# Patient Record
Sex: Male | Born: 1960
Health system: Southern US, Community
[De-identification: ages and names within clinical notes are randomized; demographics above are authoritative.]

## PROBLEM LIST (undated history)

## (undated) ENCOUNTER — Encounter

## (undated) ENCOUNTER — Ambulatory Visit

## (undated) ENCOUNTER — Encounter
Attending: Student in an Organized Health Care Education/Training Program | Primary: Student in an Organized Health Care Education/Training Program

## (undated) ENCOUNTER — Ambulatory Visit: Payer: MEDICARE

## (undated) ENCOUNTER — Encounter: Attending: Certified Registered" | Primary: Certified Registered"

## (undated) ENCOUNTER — Telehealth: Attending: Certified Registered" | Primary: Certified Registered"

## (undated) ENCOUNTER — Telehealth

## (undated) ENCOUNTER — Encounter: Attending: Gastroenterology | Primary: Gastroenterology

## (undated) ENCOUNTER — Encounter: Payer: MEDICARE | Attending: Anesthesiology | Primary: Anesthesiology

## (undated) ENCOUNTER — Encounter: Payer: MEDICARE | Attending: Dermatology | Primary: Dermatology

## (undated) ENCOUNTER — Telehealth: Attending: Internal Medicine | Primary: Internal Medicine

## (undated) ENCOUNTER — Telehealth: Attending: Ambulatory Care | Primary: Ambulatory Care

## (undated) ENCOUNTER — Encounter
Payer: MEDICARE | Attending: Student in an Organized Health Care Education/Training Program | Primary: Student in an Organized Health Care Education/Training Program

## (undated) ENCOUNTER — Ambulatory Visit: Payer: Medicare (Managed Care)

## (undated) ENCOUNTER — Ambulatory Visit: Attending: Nurse Practitioner | Primary: Nurse Practitioner

## (undated) ENCOUNTER — Encounter: Attending: Surgery | Primary: Surgery

## (undated) ENCOUNTER — Non-Acute Institutional Stay: Payer: MEDICARE

## (undated) ENCOUNTER — Encounter: Attending: Ambulatory Care | Primary: Ambulatory Care

## (undated) ENCOUNTER — Ambulatory Visit: Payer: MEDICARE | Attending: Dermatology | Primary: Dermatology

## (undated) ENCOUNTER — Non-Acute Institutional Stay: Payer: MEDICARE | Attending: Nutritionist | Primary: Nutritionist

## (undated) ENCOUNTER — Encounter: Attending: Physician Assistant | Primary: Physician Assistant

## (undated) ENCOUNTER — Ambulatory Visit: Payer: Medicare (Managed Care) | Attending: Hematology | Primary: Hematology

## (undated) ENCOUNTER — Inpatient Hospital Stay

## (undated) ENCOUNTER — Ambulatory Visit
Payer: MEDICARE | Attending: Student in an Organized Health Care Education/Training Program | Primary: Student in an Organized Health Care Education/Training Program

## (undated) ENCOUNTER — Ambulatory Visit
Attending: Student in an Organized Health Care Education/Training Program | Primary: Student in an Organized Health Care Education/Training Program

## (undated) ENCOUNTER — Telehealth: Attending: Gastroenterology | Primary: Gastroenterology

## (undated) ENCOUNTER — Telehealth: Attending: Nutritionist | Primary: Nutritionist

## (undated) ENCOUNTER — Ambulatory Visit: Attending: Surgery | Primary: Surgery

## (undated) ENCOUNTER — Inpatient Hospital Stay: Payer: Medicare (Managed Care)

## (undated) ENCOUNTER — Ambulatory Visit: Payer: MEDICARE | Attending: Ambulatory Care | Primary: Ambulatory Care

## (undated) ENCOUNTER — Ambulatory Visit: Attending: Family Medicine | Primary: Family Medicine

## (undated) ENCOUNTER — Encounter: Attending: Anesthesiology | Primary: Anesthesiology

## (undated) ENCOUNTER — Encounter: Payer: MEDICARE | Attending: Health Service | Primary: Health Service

## (undated) DIAGNOSIS — I10 Essential (primary) hypertension: Secondary | ICD-10-CM

## (undated) DIAGNOSIS — G43109 Migraine with aura, not intractable, without status migrainosus: Secondary | ICD-10-CM

## (undated) DIAGNOSIS — Q2381 Bicuspid aortic valve: Secondary | ICD-10-CM

## (undated) DIAGNOSIS — G4730 Sleep apnea, unspecified: Secondary | ICD-10-CM

## (undated) DIAGNOSIS — K219 Gastro-esophageal reflux disease without esophagitis: Secondary | ICD-10-CM

## (undated) DIAGNOSIS — E079 Disorder of thyroid, unspecified: Secondary | ICD-10-CM

## (undated) DIAGNOSIS — R7309 Other abnormal glucose: Secondary | ICD-10-CM

## (undated) DIAGNOSIS — L299 Pruritus, unspecified: Secondary | ICD-10-CM

## (undated) DIAGNOSIS — E119 Type 2 diabetes mellitus without complications: Secondary | ICD-10-CM

## (undated) DIAGNOSIS — R011 Cardiac murmur, unspecified: Secondary | ICD-10-CM

## (undated) DIAGNOSIS — G4733 Obstructive sleep apnea (adult) (pediatric): Secondary | ICD-10-CM

## (undated) DIAGNOSIS — K760 Fatty (change of) liver, not elsewhere classified: Secondary | ICD-10-CM

## (undated) DIAGNOSIS — I2699 Other pulmonary embolism without acute cor pulmonale: Secondary | ICD-10-CM

## (undated) DIAGNOSIS — R17 Unspecified jaundice: Secondary | ICD-10-CM

## (undated) DIAGNOSIS — Z944 Liver transplant status: Secondary | ICD-10-CM

## (undated) DIAGNOSIS — T7840XA Allergy, unspecified, initial encounter: Secondary | ICD-10-CM

## (undated) DIAGNOSIS — Q231 Congenital insufficiency of aortic valve: Secondary | ICD-10-CM

## (undated) DIAGNOSIS — K7460 Unspecified cirrhosis of liver: Secondary | ICD-10-CM

## (undated) DIAGNOSIS — I2510 Atherosclerotic heart disease of native coronary artery without angina pectoris: Secondary | ICD-10-CM

## (undated) DIAGNOSIS — J45909 Unspecified asthma, uncomplicated: Secondary | ICD-10-CM

## (undated) HISTORY — DX: Essential (primary) hypertension: I10

## (undated) HISTORY — DX: Fatty (change of) liver, not elsewhere classified: K76.0

## (undated) HISTORY — DX: Allergy, unspecified, initial encounter: T78.40XA

## (undated) HISTORY — DX: Liver transplant status: Z94.4

## (undated) HISTORY — DX: Cardiac murmur, unspecified: R01.1

## (undated) HISTORY — DX: Obstructive sleep apnea (adult) (pediatric): G47.33

## (undated) HISTORY — PX: CHOLECYSTECTOMY: SHX55

## (undated) HISTORY — DX: Bicuspid aortic valve: Q23.81

## (undated) HISTORY — DX: Type 2 diabetes mellitus without complications: E11.9

## (undated) HISTORY — DX: Congenital insufficiency of aortic valve: Q23.1

## (undated) HISTORY — DX: Migraine with aura, not intractable, without status migrainosus: G43.109

## (undated) HISTORY — DX: Other pulmonary embolism without acute cor pulmonale: I26.99

## (undated) HISTORY — DX: Sleep apnea, unspecified: G47.30

## (undated) HISTORY — PX: CARDIAC VALVE REPLACEMENT: SHX585

## (undated) HISTORY — DX: Unspecified cirrhosis of liver: K74.60

## (undated) HISTORY — PX: LIVER TRANSPLANT: SHX410

## (undated) HISTORY — DX: Disorder of thyroid, unspecified: E07.9

## (undated) HISTORY — DX: Other abnormal glucose: R73.09

## (undated) MED ORDER — FUROSEMIDE 20 MG TABLET
ORAL | 0 days
Start: ? — End: 2019-06-20

---

## 2000-11-15 ENCOUNTER — Encounter: Payer: Self-pay | Admitting: Family Medicine

## 2000-11-15 ENCOUNTER — Encounter: Admission: RE | Admit: 2000-11-15 | Discharge: 2000-11-15 | Payer: Self-pay | Admitting: Family Medicine

## 2002-07-25 HISTORY — PX: DOPPLER ECHOCARDIOGRAPHY: SHX263

## 2004-07-16 ENCOUNTER — Ambulatory Visit: Payer: Self-pay | Admitting: Internal Medicine

## 2005-02-18 ENCOUNTER — Ambulatory Visit: Payer: Self-pay | Admitting: Family Medicine

## 2005-03-23 ENCOUNTER — Ambulatory Visit: Payer: Self-pay | Admitting: Family Medicine

## 2005-04-23 HISTORY — PX: CARDIOVASCULAR STRESS TEST: SHX262

## 2006-01-15 ENCOUNTER — Ambulatory Visit: Payer: Self-pay | Admitting: Family Medicine

## 2006-01-21 ENCOUNTER — Encounter: Admission: RE | Admit: 2006-01-21 | Discharge: 2006-01-21 | Payer: Self-pay | Admitting: Family Medicine

## 2006-01-21 ENCOUNTER — Ambulatory Visit: Payer: Self-pay | Admitting: Family Medicine

## 2006-07-16 ENCOUNTER — Telehealth (INDEPENDENT_AMBULATORY_CARE_PROVIDER_SITE_OTHER): Payer: Self-pay | Admitting: *Deleted

## 2006-08-09 DIAGNOSIS — E039 Hypothyroidism, unspecified: Secondary | ICD-10-CM | POA: Insufficient documentation

## 2006-08-09 DIAGNOSIS — I10 Essential (primary) hypertension: Secondary | ICD-10-CM | POA: Insufficient documentation

## 2006-08-09 DIAGNOSIS — J309 Allergic rhinitis, unspecified: Secondary | ICD-10-CM | POA: Insufficient documentation

## 2006-08-11 ENCOUNTER — Ambulatory Visit: Payer: Self-pay | Admitting: Family Medicine

## 2006-08-11 DIAGNOSIS — H9190 Unspecified hearing loss, unspecified ear: Secondary | ICD-10-CM | POA: Insufficient documentation

## 2006-08-12 LAB — CONVERTED CEMR LAB
ALT: 91 units/L — ABNORMAL HIGH (ref 0–40)
AST: 56 units/L — ABNORMAL HIGH (ref 0–37)
BUN: 11 mg/dL (ref 6–23)
Basophils Absolute: 0.1 10*3/uL (ref 0.0–0.1)
Basophils Relative: 0.8 % (ref 0.0–1.0)
CO2: 30 meq/L (ref 19–32)
Calcium: 9.5 mg/dL (ref 8.4–10.5)
Chloride: 108 meq/L (ref 96–112)
Cholesterol: 170 mg/dL (ref 0–200)
Creatinine, Ser: 1.1 mg/dL (ref 0.4–1.5)
Eosinophils Absolute: 0.4 10*3/uL (ref 0.0–0.6)
Eosinophils Relative: 5 % (ref 0.0–5.0)
GFR calc Af Amer: 94 mL/min
GFR calc non Af Amer: 77 mL/min
Glucose, Bld: 97 mg/dL (ref 70–99)
HCT: 47.5 % (ref 39.0–52.0)
HDL: 35.3 mg/dL — ABNORMAL LOW (ref >39.0)
Hemoglobin: 16.3 g/dL (ref 13.0–17.0)
LDL Cholesterol: 117 mg/dL — ABNORMAL HIGH (ref 0–99)
Lymphocytes Relative: 27.4 % (ref 12.0–46.0)
MCHC: 34.2 g/dL (ref 30.0–36.0)
MCV: 87.1 fL (ref 78.0–100.0)
Monocytes Absolute: 0.8 10*3/uL — ABNORMAL HIGH (ref 0.2–0.7)
Monocytes Relative: 9 % (ref 3.0–11.0)
Neutro Abs: 4.8 10*3/uL (ref 1.4–7.7)
Neutrophils Relative %: 57.8 % (ref 43.0–77.0)
Platelets: 286 10*3/uL (ref 150–400)
Potassium: 4.8 meq/L (ref 3.5–5.1)
RBC: 5.45 M/uL (ref 4.22–5.81)
RDW: 11.7 % (ref 11.5–14.6)
Sodium: 139 meq/L (ref 135–145)
Total CHOL/HDL Ratio: 4.8
Triglycerides: 90 mg/dL (ref 0–149)
VLDL: 18 mg/dL (ref 0–40)
WBC: 8.4 10*3/uL (ref 4.5–10.5)

## 2006-08-17 ENCOUNTER — Encounter (INDEPENDENT_AMBULATORY_CARE_PROVIDER_SITE_OTHER): Payer: Self-pay | Admitting: Internal Medicine

## 2006-08-19 ENCOUNTER — Telehealth (INDEPENDENT_AMBULATORY_CARE_PROVIDER_SITE_OTHER): Payer: Self-pay | Admitting: Internal Medicine

## 2006-08-19 ENCOUNTER — Encounter (INDEPENDENT_AMBULATORY_CARE_PROVIDER_SITE_OTHER): Payer: Self-pay | Admitting: Internal Medicine

## 2006-08-31 ENCOUNTER — Encounter: Admission: RE | Admit: 2006-08-31 | Discharge: 2006-08-31 | Payer: Self-pay | Admitting: Endocrinology

## 2007-02-14 ENCOUNTER — Ambulatory Visit: Payer: Self-pay | Admitting: Internal Medicine

## 2007-02-14 DIAGNOSIS — E7800 Pure hypercholesterolemia, unspecified: Secondary | ICD-10-CM | POA: Insufficient documentation

## 2007-02-15 LAB — CONVERTED CEMR LAB
ALT: 136 units/L — ABNORMAL HIGH (ref 0–53)
AST: 95 units/L — ABNORMAL HIGH (ref 0–37)
BUN: 12 mg/dL (ref 6–23)
CO2: 27 meq/L (ref 19–32)
Calcium: 9.6 mg/dL (ref 8.4–10.5)
Chloride: 103 meq/L (ref 96–112)
Cholesterol: 149 mg/dL (ref 0–200)
Creatinine, Ser: 1 mg/dL (ref 0.4–1.5)
GFR calc Af Amer: 104 mL/min
GFR calc non Af Amer: 86 mL/min
Glucose, Bld: 105 mg/dL — ABNORMAL HIGH (ref 70–99)
HDL: 35.7 mg/dL — ABNORMAL LOW (ref >39.0)
LDL Cholesterol: 94 mg/dL (ref 0–99)
Potassium: 4.2 meq/L (ref 3.5–5.1)
Sodium: 139 meq/L (ref 135–145)
TSH: 2.65 microintl units/mL (ref 0.35–5.50)
Total CHOL/HDL Ratio: 4.2
Triglycerides: 96 mg/dL (ref 0–149)
VLDL: 19 mg/dL (ref 0–40)

## 2007-08-19 HISTORY — PX: BIOPSY THYROID: PRO38

## 2007-08-29 ENCOUNTER — Ambulatory Visit: Payer: Self-pay | Admitting: Family Medicine

## 2007-08-29 ENCOUNTER — Encounter (INDEPENDENT_AMBULATORY_CARE_PROVIDER_SITE_OTHER): Payer: Self-pay | Admitting: Internal Medicine

## 2007-08-29 DIAGNOSIS — R739 Hyperglycemia, unspecified: Secondary | ICD-10-CM | POA: Insufficient documentation

## 2007-08-29 DIAGNOSIS — R0602 Shortness of breath: Secondary | ICD-10-CM | POA: Insufficient documentation

## 2007-08-30 ENCOUNTER — Encounter (INDEPENDENT_AMBULATORY_CARE_PROVIDER_SITE_OTHER): Payer: Self-pay | Admitting: Internal Medicine

## 2007-09-01 LAB — CONVERTED CEMR LAB
ALT: 114 units/L — ABNORMAL HIGH (ref 0–53)
AST: 82 units/L — ABNORMAL HIGH (ref 0–37)
Albumin: 4.3 g/dL (ref 3.5–5.2)
Alkaline Phosphatase: 94 units/L (ref 39–117)
BUN: 12 mg/dL (ref 6–23)
Bilirubin, Direct: 0.2 mg/dL (ref 0.0–0.3)
CO2: 28 meq/L (ref 19–32)
Calcium: 10.1 mg/dL (ref 8.4–10.5)
Chloride: 101 meq/L (ref 96–112)
Cholesterol: 182 mg/dL (ref 0–200)
Creatinine, Ser: 1.1 mg/dL (ref 0.4–1.5)
GFR calc Af Amer: 93 mL/min
GFR calc non Af Amer: 77 mL/min
Glucose, Bld: 98 mg/dL (ref 70–99)
HDL: 37.5 mg/dL — ABNORMAL LOW (ref >39.0)
LDL Cholesterol: 125 mg/dL — ABNORMAL HIGH (ref 0–99)
Potassium: 4.2 meq/L (ref 3.5–5.1)
Sodium: 137 meq/L (ref 135–145)
TSH: 7.21 microintl units/mL — ABNORMAL HIGH (ref 0.35–5.50)
Total Bilirubin: 0.8 mg/dL (ref 0.3–1.2)
Total CHOL/HDL Ratio: 4.9
Total Protein: 7.4 g/dL (ref 6.0–8.3)
Triglycerides: 99 mg/dL (ref 0–149)
VLDL: 20 mg/dL (ref 0–40)

## 2007-09-29 ENCOUNTER — Encounter (INDEPENDENT_AMBULATORY_CARE_PROVIDER_SITE_OTHER): Payer: Self-pay | Admitting: Internal Medicine

## 2007-10-03 ENCOUNTER — Ambulatory Visit: Payer: Self-pay | Admitting: Family Medicine

## 2007-10-03 ENCOUNTER — Encounter (INDEPENDENT_AMBULATORY_CARE_PROVIDER_SITE_OTHER): Payer: Self-pay | Admitting: Internal Medicine

## 2007-11-24 HISTORY — PX: APPENDECTOMY: SHX54

## 2007-12-03 ENCOUNTER — Inpatient Hospital Stay (HOSPITAL_COMMUNITY): Admission: EM | Admit: 2007-12-03 | Discharge: 2007-12-04 | Payer: Self-pay | Admitting: Emergency Medicine

## 2007-12-03 ENCOUNTER — Ambulatory Visit: Payer: Self-pay | Admitting: Family Medicine

## 2007-12-03 ENCOUNTER — Encounter (INDEPENDENT_AMBULATORY_CARE_PROVIDER_SITE_OTHER): Payer: Self-pay | Admitting: General Surgery

## 2007-12-03 DIAGNOSIS — K3580 Unspecified acute appendicitis: Secondary | ICD-10-CM | POA: Insufficient documentation

## 2007-12-03 DIAGNOSIS — R1031 Right lower quadrant pain: Secondary | ICD-10-CM | POA: Insufficient documentation

## 2007-12-03 LAB — CONVERTED CEMR LAB
Bilirubin Urine: NEGATIVE
Blood in Urine, dipstick: NEGATIVE
Glucose, Urine, Semiquant: NEGATIVE
Ketones, urine, test strip: NEGATIVE
Nitrite: NEGATIVE
Protein, U semiquant: NEGATIVE
Specific Gravity, Urine: 1.015
Urobilinogen, UA: 0.2
WBC Urine, dipstick: NEGATIVE
pH: 6

## 2007-12-27 ENCOUNTER — Ambulatory Visit: Payer: Self-pay | Admitting: Family Medicine

## 2008-02-08 ENCOUNTER — Ambulatory Visit: Payer: Self-pay | Admitting: Family Medicine

## 2008-02-08 ENCOUNTER — Encounter (INDEPENDENT_AMBULATORY_CARE_PROVIDER_SITE_OTHER): Payer: Self-pay | Admitting: Internal Medicine

## 2008-02-09 LAB — CONVERTED CEMR LAB: TSH: 0.82 microintl units/mL (ref 0.35–5.50)

## 2008-05-30 ENCOUNTER — Ambulatory Visit: Payer: Self-pay | Admitting: Family Medicine

## 2008-05-30 DIAGNOSIS — R351 Nocturia: Secondary | ICD-10-CM | POA: Insufficient documentation

## 2008-10-09 ENCOUNTER — Ambulatory Visit: Payer: Self-pay | Admitting: Family Medicine

## 2008-10-11 LAB — CONVERTED CEMR LAB
ALT: 109 units/L — ABNORMAL HIGH (ref 0–53)
AST: 74 units/L — ABNORMAL HIGH (ref 0–37)
Albumin: 4.3 g/dL (ref 3.5–5.2)
Alkaline Phosphatase: 98 units/L (ref 39–117)
BUN: 13 mg/dL (ref 6–23)
Bilirubin, Direct: 0.1 mg/dL (ref 0.0–0.3)
CO2: 30 meq/L (ref 19–32)
Calcium: 9.6 mg/dL (ref 8.4–10.5)
Chloride: 106 meq/L (ref 96–112)
Cholesterol: 169 mg/dL (ref 0–200)
Creatinine, Ser: 1.1 mg/dL (ref 0.4–1.5)
GFR calc non Af Amer: 76.48 mL/min (ref >60)
Glucose, Bld: 95 mg/dL (ref 70–99)
HDL: 37.4 mg/dL — ABNORMAL LOW (ref >39.00)
LDL Cholesterol: 113 mg/dL — ABNORMAL HIGH (ref 0–99)
PSA: 0.52 ng/mL (ref 0.10–4.00)
Potassium: 4.5 meq/L (ref 3.5–5.1)
Sodium: 141 meq/L (ref 135–145)
TSH: 1.24 microintl units/mL (ref 0.35–5.50)
Total Bilirubin: 1 mg/dL (ref 0.3–1.2)
Total CHOL/HDL Ratio: 5
Total Protein: 7.7 g/dL (ref 6.0–8.3)
Triglycerides: 95 mg/dL (ref 0.0–149.0)
VLDL: 19 mg/dL (ref 0.0–40.0)

## 2009-04-10 ENCOUNTER — Ambulatory Visit: Payer: Self-pay | Admitting: Family Medicine

## 2009-04-10 LAB — CONVERTED CEMR LAB
ALT: 100 units/L — ABNORMAL HIGH (ref 0–53)
AST: 67 units/L — ABNORMAL HIGH (ref 0–37)
Albumin: 4.4 g/dL (ref 3.5–5.2)
Alkaline Phosphatase: 102 units/L (ref 39–117)
BUN: 15 mg/dL (ref 6–23)
Bilirubin, Direct: 0.1 mg/dL (ref 0.0–0.3)
CO2: 30 meq/L (ref 19–32)
Calcium: 9.5 mg/dL (ref 8.4–10.5)
Chloride: 107 meq/L (ref 96–112)
Cholesterol: 173 mg/dL (ref 0–200)
Creatinine, Ser: 1 mg/dL (ref 0.4–1.5)
GFR calc non Af Amer: 85.18 mL/min (ref >60)
Glucose, Bld: 107 mg/dL — ABNORMAL HIGH (ref 70–99)
HDL: 44.8 mg/dL (ref >39.00)
LDL Cholesterol: 102 mg/dL — ABNORMAL HIGH (ref 0–99)
Potassium: 4.6 meq/L (ref 3.5–5.1)
Sodium: 139 meq/L (ref 135–145)
TSH: 0.3 microintl units/mL — ABNORMAL LOW (ref 0.35–5.50)
Total Bilirubin: 0.7 mg/dL (ref 0.3–1.2)
Total CHOL/HDL Ratio: 4
Total Protein: 7.7 g/dL (ref 6.0–8.3)
Triglycerides: 129 mg/dL (ref 0.0–149.0)
VLDL: 25.8 mg/dL (ref 0.0–40.0)

## 2009-05-06 ENCOUNTER — Encounter: Payer: Self-pay | Admitting: Family Medicine

## 2009-09-20 ENCOUNTER — Emergency Department (HOSPITAL_COMMUNITY): Admission: EM | Admit: 2009-09-20 | Discharge: 2009-09-20 | Payer: Self-pay | Admitting: Family Medicine

## 2009-09-25 ENCOUNTER — Ambulatory Visit: Payer: Self-pay | Admitting: Family Medicine

## 2009-09-25 DIAGNOSIS — R21 Rash and other nonspecific skin eruption: Secondary | ICD-10-CM | POA: Insufficient documentation

## 2009-10-29 ENCOUNTER — Ambulatory Visit: Payer: Self-pay | Admitting: Family Medicine

## 2009-10-30 LAB — CONVERTED CEMR LAB
ALT: 102 units/L — ABNORMAL HIGH (ref 0–53)
AST: 67 units/L — ABNORMAL HIGH (ref 0–37)
Albumin: 4.3 g/dL (ref 3.5–5.2)
Alkaline Phosphatase: 91 units/L (ref 39–117)
BUN: 17 mg/dL (ref 6–23)
Bilirubin, Direct: 0.2 mg/dL (ref 0.0–0.3)
CO2: 27 meq/L (ref 19–32)
Calcium: 9.3 mg/dL (ref 8.4–10.5)
Chloride: 104 meq/L (ref 96–112)
Cholesterol: 183 mg/dL (ref 0–200)
Creatinine, Ser: 1 mg/dL (ref 0.4–1.5)
GFR calc non Af Amer: 82.13 mL/min (ref >60)
Glucose, Bld: 95 mg/dL (ref 70–99)
HDL: 39.1 mg/dL (ref >39.00)
LDL Cholesterol: 121 mg/dL — ABNORMAL HIGH (ref 0–99)
Potassium: 4.5 meq/L (ref 3.5–5.1)
Sodium: 138 meq/L (ref 135–145)
TSH: 1.14 microintl units/mL (ref 0.35–5.50)
Total Bilirubin: 0.8 mg/dL (ref 0.3–1.2)
Total CHOL/HDL Ratio: 5
Total Protein: 7.3 g/dL (ref 6.0–8.3)
Triglycerides: 116 mg/dL (ref 0.0–149.0)
VLDL: 23.2 mg/dL (ref 0.0–40.0)

## 2009-11-21 ENCOUNTER — Ambulatory Visit: Payer: Self-pay | Admitting: Internal Medicine

## 2009-11-27 ENCOUNTER — Encounter: Payer: Self-pay | Admitting: Internal Medicine

## 2009-11-27 ENCOUNTER — Ambulatory Visit: Payer: Self-pay

## 2009-11-27 ENCOUNTER — Ambulatory Visit: Payer: Self-pay | Admitting: Cardiovascular Disease

## 2009-11-27 ENCOUNTER — Ambulatory Visit (HOSPITAL_COMMUNITY): Admission: RE | Admit: 2009-11-27 | Discharge: 2009-11-27 | Payer: Self-pay | Admitting: Internal Medicine

## 2009-12-09 ENCOUNTER — Telehealth: Payer: Self-pay | Admitting: Internal Medicine

## 2010-02-20 ENCOUNTER — Ambulatory Visit
Admission: RE | Admit: 2010-02-20 | Discharge: 2010-02-20 | Payer: Self-pay | Source: Home / Self Care | Attending: Family Medicine | Admitting: Family Medicine

## 2010-03-27 NOTE — Miscellaneous (Signed)
Summary: new synthroid dose  Clinical Lists Changes  Medications: Added new medication of SYNTHROID 112 MCG  TABS (LEVOTHYROXINE SODIUM) 1 daily by mouth - Signed Rx of SYNTHROID 112 MCG  TABS (LEVOTHYROXINE SODIUM) 1 daily by mouth;  #30 x 1;  Signed;  Entered by: Gildardo Griffes FNP;  Authorized by: Gildardo Griffes FNP;  Method used: Telephoned to    Prescriptions: SYNTHROID 112 MCG  TABS (LEVOTHYROXINE SODIUM) 1 daily by mouth  #30 x 1   Entered and Authorized by:   Gildardo Griffes FNP   Signed by:   Gildardo Griffes FNP on 08/30/2007   Method used:   Telephoned to ...         RxID:   [id number]  med phoned to pharmacy.   Cooper Render  August 30, 2007 12:08 PM

## 2010-03-27 NOTE — Assessment & Plan Note (Signed)
Summary: CPX/DLO   Vital Signs:  Patient Profile:   50 Years Old Male Height:     69 inches Weight:      190 pounds Temp:     97.8 degrees F oral Pulse rate:   62 / minute BP sitting:   127 / 81  (left arm) Cuff size:   regular  Vitals Entered By: Cooper Render (August 29, 2007 9:00 AM)                 Chief Complaint:  ck up.  History of Present Illness: Here for CPX --had worse spring allergies this year than usual--back to nl now, needed Serevent on regular basis, off of now but continues to have shortness of breath with exertion at times.  Claritin +,- improvement takes once daily as needed   Had pain in L buttocks off and on x 1wk whrn got up or down and with lying down, now gone.  took nothing  Was referred to Dr Dagoberto Ligas 6/08 for ? thyroid mass, he was sent for bx by Dr Dwyane Dee bx doone as radiologist could find nothing.  we have received no records and he heard nothing.    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  Resp      Complains of shortness of breath and wheezing.      less exercise than  last job--no exercise.  finds that he mouth breaths more than has in the past wheezing with allergies  GI      Complains of indigestion.      Denies change in bowel habits, constipation, diarrhea, nausea, and vomiting.      has abd pain oiff and on --tums helps--takes 1-2 as needed tums for heartburn--takes 0-1-2xwk for, releaves with 1-2  GU      Complains of nocturia.      nocturia x 2 --can get back to sleep  Derm      Denies lesion(s) and rash.  Neuro      Complains of tremors.      Denies difficulty with concentration, disturbances in coordination, memory loss, and weakness.      hands shake at times  Psych      Denies anxiety and depression.   Physical Exam  General:     alert, well-developed, well-nourished, and well-hydrated.  Head:     normocephalic and atraumatic.   Neck:     no masses, no thyromegaly, no thyroid nodules or tenderness, normal carotid upstroke, and no carotid bruits.   Lungs:     normal respiratory effort, no intercostal retractions, no accessory muscle use, and normal breath sounds.   Heart:     normal rate, regular rhythm, and no murmur.   Abdomen:     soft, non-tender, normal bowel sounds, no distention, no masses, no guarding, no abdominal hernia, no inguinal hernia, no hepatomegaly, and no splenomegaly.   Pulses:     R posterior tibial normal, R dorsalis pedis normal, L posterior tibial normal, and L dorsalis pedis normal.   Extremities:     no edema either ankle Neurologic:     alert & oriented X3, strength normal in all extremities, sensation intact to light touch, gait normal, and DTRs symmetrical and normal.   Skin:     turgor normal.  has sun damage posterior of both hands and increased tanning Cervical Nodes:     no anterior cervical adenopathy and no posterior cervical adenopathy.   Inguinal Nodes:     no R inguinal adenopathy and no L inguinal adenopathy.   Psych:     normally interactive.      Impression &  Recommendations:  Problem # 1:  WELL ADULT EXAM (ICD-V70.0) well 50 yr old male with several medical problems that he is followed for Orders: Venipuncture (11914) TLB-Lipid Panel (80061-LIPID)   Problem # 2:  SHORTNESS OF BREATH (ICD-786.05) Assessment: New having more shortness of breath since not as active at work and no exercise at home will get spirometry--mild obstruction have asked him to walk on his elipitical machine 4xwk for 1 mo at rate slow enough that he can breathe through nose throughout see back in 1 mo to discuss results and possible need for chronic Serevent--indicated is expensive Orders: Spirometry w/Graph (94010)   Problem # 3:  HYPOTHYROIDISM (ICD-244.9) will get TSH, titrate synthroid if needed will get records fsor m Dr Dagoberto Ligas His updated medication list for this problem includes:    Synthroid 100 Mcg Tabs (Levothyroxine sodium) .[name]... Take one by mouth daily  Orders: TLB-TSH (Thyroid Stimulating Hormone) (84443-TSH)   Problem # 4:  HYPERTENSION (ICD-401.9) Assessment: Unchanged stable, continue on current meds will  get Bmet His updated medication list for this problem includes:    Amlodipine Besy-benazepril Hcl 10-20 Mg Caps (Amlodipine besy-benazepril hcl) .[name]... Take one by mouth daily  Orders: TLB-BMP (Basic Metabolic Panel-BMET) (80048-METABOL)   Problem # 5:  PURE HYPERCHOLESTEROLEMIA (ICD-272.0) currently controlled on diet alone Labs Reviewed: Chol: 149 (02/14/2007)   HDL: 35.7 (02/14/2007)   LDL: 94 (02/14/2007)   TG: 96 (02/14/2007) SGOT: 95 (02/14/2007)   SGPT: 136 (02/14/2007)   Problem # 6:  FATTY LIVER DISEASE BY U/S, ELEVATED LFT'S (ICD-571.8) Assessment: Deteriorated elevations on last labs 12/08--see # 5 Orders: TLB-Hepatic/Liver Function Pnl (80076-HEPATIC)   Problem # 7:  HYPERGLYCEMIA (ICD-790.29) Assessment: New  elevated to 105  at 28mo check in 12/08 get glucose to follow discussed reduction of simple CHO in diet       Complete Medication List: 1)  Synthroid 100 Mcg Tabs (Levothyroxine sodium) .... Take one by mouth daily 2)  Amlodipine Besy-benazepril Hcl 10-20 Mg Caps (Amlodipine besy-benazepril hcl) .... Take one by mouth daily 3)  Serevent Diskus 50 Mcg/dose Aepb (Salmeterol xinafoate) .... Take 2 puffs daily prn 4)  Claritin Tabs (Loratadine tabs) .... Take one  by mouth daily prn   Patient Instructions: 1)  get records from Dr Dagoberto Ligas 6/08   ] Prior Medications (reviewed today): SYNTHROID 100 MCG TABS (LEVOTHYROXINE SODIUM) Take one by mouth daily AMLODIPINE BESY-BENAZEPRIL HCL 10-20 MG CAPS (AMLODIPINE BESY-BENAZEPRIL HCL) Take one by mouth daily SEREVENT DISKUS 50 MCG/DOSE  AEPB (SALMETEROL XINAFOATE) Take 2 puffs daily prn CLARITIN   TABS (LORATADINE TABS) Take one by mouth daily prn Current Allergies (reviewed today): No known allergies

## 2010-03-27 NOTE — Progress Notes (Signed)
  Phone Note Call from Patient   Details for Reason: REFERRAL MADE TO DR Dagoberto Ligas  Summary of Call: PT WAS REFERRED TO DR Dagoberto Ligas TODAY 08/19/06 ALL RECORDS AND LABS AND [place] REPORT WERE FAXED . [facility]  Initial call taken by: Carlton Adam,  August 19, 2006 1:03 PM  Follow-up for Phone Call        noted Follow-up by: Gildardo Griffes FNP,  August 19, 2006 1:09 PM

## 2010-03-27 NOTE — Assessment & Plan Note (Signed)
Summary: FLU SHOT ONLY -12:15/MK  Nurse Visit    Prior Medications: AMLODIPINE BESY-BENAZEPRIL HCL 10-20 MG CAPS (AMLODIPINE BESY-BENAZEPRIL HCL) Take one by mouth daily SEREVENT DISKUS 50 MCG/DOSE  AEPB (SALMETEROL XINAFOATE) Take 2 puffs daily prn CLARITIN   TABS (LORATADINE TABS) Take one by mouth daily prn SYNTHROID 112 MCG  TABS (LEVOTHYROXINE SODIUM) 1 daily by mouth Current Allergies: No known allergies     Orders Added: 1)  Flu Vaccine 22yrs + [90658] 2)  Admin 1st Vaccine [email or web address]    ]  Influenza Vaccine    Vaccine Type: Fluvax 3+    Site: right deltoid    Mfr: GlaxoSmithKline    Dose: 0.5 ml    Route: IM    Given by: Providence Crosby    Exp. Date: 08/22/2008    Lot #: [id number]    VIS given: 09/16/06 version given December 27, 2007.  Flu Vaccine Consent Questions    Do you have a history of severe allergic reactions to this vaccine? no    Any prior history of allergic reactions to egg and/or gelatin? no    Do you have a sensitivity to the preservative Thimersol? no    Do you have a past history of Guillan-Barre Syndrome? no    Do you currently have an acute febrile illness? no    Have you ever had a severe reaction to latex? no    Vaccine information given and explained to patient? yes

## 2010-03-27 NOTE — Assessment & Plan Note (Signed)
Summary: CPX/CLE   Vital Signs:  Patient Profile:   50 Years Old Male Height:     68.75 inches Weight:      190 pounds Temp:     97.8 degrees F Pulse rate:   64 / minute BP sitting:   120 / 81  (right arm) Cuff size:   regular  Vitals Entered By: Cooper Render (August 11, 2006 9:15 AM)               Chief Complaint:  ck up.  History of Present Illness: Here for Cpx.Marland Kitchenage 33.  Has been well.   Lump L chest first seen 11/07 still there,pulls. Low back ache in late day, goes away with sitting down, taking nothing.  Has noticed some hearing loss, has seen no one.  Also wearing OTC reading glasses for 1 and 1/2 yrs--lowest level.  No eye exam in yrs.  Things at home going well.  Current Allergies (reviewed today): No known allergies  Updated/Current Medications (including changes made in today's visit):  SYNTHROID 100 MCG TABS (LEVOTHYROXINE SODIUM) Take one by mouth daily AMLODIPINE BESY-BENAZEPRIL HCL 10-20 MG CAPS (AMLODIPINE BESY-BENAZEPRIL HCL) Take one by mouth daily SEREVENT DISKUS 50 MCG/DOSE  AEPB (SALMETEROL XINAFOATE) Take 2 puffs daily prn CLARITIN   TABS (LORATADINE TABS) Take one by mouth daily prn   Past Medical History:    Reviewed history from 08/09/2006 and no changes required:       Allergic rhinitis       Hypertension       Hypothyroidism   Social History:    Marital Status: Married    Children: 1--18 daughter    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  Resp      Complains of shortness of breath and wheezing.      Denies cough.      this spring wisth  allergies  GI      Denies change in bowel habits, constipation, diarrhea, nausea, and vomiting.  GU      Complains of nocturia.      Denies decreased libido, discharge, and erectile dysfunction.      nocturia x2  MS      Complains of joint pain and low back pain.      knee pain  Derm      Denies lesion(s) and rash.      uses sun screen  Neuro      Complains of memory loss.      Denies tremors and weakness.      needs to make lists  Psych      Denies anxiety and depression.      goes to races and beach music for fun  Allergy      Complains of seasonal allergies.   Physical Exam  General:     alert, well-developed, well-nourished, and well-hydrated.   Head:     normocephalic.   Eyes:     no injection.   Ears:     R ear normal and L ear normal.  Nose:     clear Mouth:     pharynx pink and moist.   Neck:     thyromegaly.   Lungs:     normal respiratory effort and normal breath sounds.   Heart:     normal rate, regular rhythm, and no murmur.   Abdomen:     soft, non-tender, normal bowel sounds, no masses, no abdominal hernia, no inguinal hernia, no hepatomegaly, and no splenomegaly.   Msk:     normal ROM, no redness over joints, and no joint deformities.   Extremities:     no edema Neurologic:     alert & oriented X3, cranial nerves II-XII intact, gait normal, and DTRs symmetrical and equally hyperreflexic   Skin:     turgor normal and color normal.   Cervical Nodes:     no anterior cervical adenopathy and no posterior cervical adenopathy.   Inguinal Nodes:     no R inguinal adenopathy and no L inguinal adenopathy.   Psych:     normally interactive and good eye contact.      Impression & Recommendations:  Problem # 1:  Preventive Health Care (ICD-V70.0)  Problem # 2:  HYPOTHYROIDISM (ICD-244.9)  His updated medication list for this problem includes:    Synthroid 100 Mcg Tabs (Levothyroxine sodium) .[name]... Take one by mouth  daily  Orders: Ultrasound (Ultrasound)   Problem # 3:  HYPERTENSION (ICD-401.9)  His updated medication list for this problem includes:    Amlodipine Besy-benazepril Hcl 10-20 Mg Caps (Amlodipine besy-benazepril hcl) .[name]... Take one by mouth daily  Orders: TLB-BMP (Basic Metabolic Panel-BMET) (80048-METABOL) TLB-CBC Platelet - w/Differential (85025-CBCD) TLB-Lipid Panel (80061-LIPID)   Problem # 4:  LOSS, HEARING NOS (ICD-389.9) screening WNL, however he feels that he has some loss, discussed referral to otiologist--will think about and call me  Problem # 5:  FATTY LIVER DISEASE BY U/S, ELEVATED LFT'S (ICD-571.8)  Orders: TLB-ALT (SGPT) (84460-ALT) TLB-AST (SGOT) (84450-SGOT)    Patient Instructions: 1)  refer for thyroid u/s--notify of findings 2)  fasting labs--notify of findings and titrate meds as needed 3)  Hypertension--stable 4)  Please schedule a follow-up labs in 6 months.--non fasting  Appended Document: CPX/CLE                Audiometry Screening        25db HL: Yes Hearing test: pass Comment: all responses corect--R and L    Current Allergies: No known allergies

## 2010-03-27 NOTE — Progress Notes (Signed)
Summary: rx   Phone Note Call from Patient Call back at Work Phone [phone]   Caller: Patient Call For: bean Summary of Call: rx for "allergy respirator little puffer's" [facility] Initial call taken by: Liane Comber,  Jul 16, 2006 3:35 PM  Follow-up for Phone Call        rx for serovent called in Follow-up by: Liane Comber,  Jul 20, 2006 8:09 AM

## 2010-03-27 NOTE — Assessment & Plan Note (Signed)
Summary: 6 M F/U  DLO   Vital Signs:  Patient Profile:   50 Years Old Male Height:     69 inches Weight:      191 pounds Temp:     98.4 degrees F oral Pulse rate:   72 / minute Pulse rhythm:   regular BP sitting:   130 / 90  (right arm) Cuff size:   regular  Vitals Entered By: Sydell Axon (February 08, 2008 8:12 AM)                 Chief Complaint:  4 month checkup.  History of Present Illness: Here for shortness of breath follow up  --continuing to exercise (elipitical)--walking 3xwk---better now than 3 mo ago. --has been using Sevevent as an as needed med only, used x 1 last week   Had emergency appendectomy 10/09--doing well now.    Current Allergies (reviewed today): No known allergies   Past Surgical History:    Exercise stress test 03/07, negative 06/05    2 -D ECHO 06/04    attempted thyroid bx--08/19/07--no tissue obtained    emergency appendectomy 10/09   Family History:    Father: Died when he was age 46 of cancer with mets, site unknown, COPD, hx of partonitis    Mother: Alive with asthma    Siblings: Sister died with her "hearts stopping".        MGM--colon cancer    MGF--MI .20YRS OF AGE    M uncle--ca --site unknown   Risk Factors: Tobacco use:  never Passive smoke exposure:  no Drug use:  no HIV high-risk behavior:  no Alcohol use:  yes    Drinks per day:  <1 Exercise:  no Seatbelt use:  100 % Sun Exposure:  occasionally  Family History Risk Factors:    Family History of MI in females < 28 years old:  no    Family History of MI in males < 48 years old:  no   Review of Systems  CV      Denies chest pain or discomfort, palpitations, swelling of feet, and swelling of hands.  Resp      Complains of shortness of breath and wheezing.      Denies cough.  GI      See HPI   Physical Exam  General:     alert, well-developed, well-nourished, and well-hydrated.   Lungs:     normal respiratory effort, no intercostal  retractions, no accessory muscle use, and normal breath sounds.  no increase in inspiration moist cough only spirometry: mild obstruction Heart:     normal rate, regular rhythm, and no murmur.   Extremities:     no edema either lower legs Psych:     normally interactive, not anxious appearing, and not depressed appearing.      Impression & Recommendations:  Problem # 1:  SHORTNESS OF BREATH (ICD-786.05) Assessment: Unchanged will use Proair as needed acute episodes is to stop using Serevent on as needed basis---may need in allergy season see back  in 4 mo or as needed  spirometry reveals mild obstruction Orders: Spirometry w/Graph (94010)   Problem # 2:  HYPOTHYROIDISM (ICD-244.9) Assessment: Comment Only has not had recheck after increase of dose 8/09--will do today and titrate as needed His updated medication list for this problem includes:    Synthroid 112 Mcg Tabs (Levothyroxine sodium) .[name]... 1 daily by mouth  Orders: TLB-TSH (Thyroid Stimulating Hormone) (84443-TSH)   Complete Medication List: 1)  Amlodipine Besy-benazepril Hcl 10-20 Mg Caps (Amlodipine besy-benazepril hcl) .... Take one by mouth daily 2)  Claritin Tabs (Loratadine tabs) .... Take one by mouth daily as needed 3)  Synthroid 112 Mcg Tabs (Levothyroxine sodium) .[name].. 1 daily by mouth 4)  Proair Hfa 108 (90 Base) Mcg/act Aers (Albuterol sulfate) .[name].. 1-2 puffs every 4 hrs as needed wheezing or chest tightness   Patient Instructions: 1)  Please schedule a follow-up appointment in 4 months.   ] Current Allergies (reviewed today): No known allergies

## 2010-03-27 NOTE — Assessment & Plan Note (Signed)
Summary: 3 M F/U  DLO   Vital Signs:  Patient Profile:   50 Years Old Male Height:     69 inches Weight:      191 pounds Temp:     98.0 degrees F oral Pulse rate:   70 / minute BP sitting:   135 / 88  (left arm) Cuff size:   regular  Vitals Entered By: Cooper Render (October 03, 2007 9:13 AM)                 Chief Complaint:  3 M F/U.  History of Present Illness: Here for follow up of shortness of breath noted 1 mo ago at CPX.  Began walking on elipitical 3x wk for past mo--cant see much improvement. Not using Serevent on regular basis--flairs of wheezing only.    Current Allergies (reviewed today): No known allergies   Past Medical History:    Reviewed history from 08/09/2006 and no changes required:       Allergic rhinitis       Hypertension       Hypothyroidism     Review of Systems  CV      Denies chest pain or discomfort, palpitations, swelling of feet, and swelling of hands.  Resp      See HPI  MS      Denies joint pain and muscle aches.   Physical Exam  General:     alert, well-developed, well-nourished, and well-hydrated.   Lungs:     normal respiratory effort, no intercostal retractions, no accessory muscle use, and normal breath sounds.  dry cough spirometry --nl Psych:     normally interactive.      Impression & Recommendations:  Problem # 1:  SHORTNESS OF BREATH (ICD-786.05) Assessment: Improved spirometry improved form mild obst to nl with 1 mo walking on tread mill--encouraged to continue exercise program on regular basis will recheck spirometry in 4 mo--agrees Orders: Spirometry w/Graph (94010)   Complete Medication List: 1)  Amlodipine Besy-benazepril Hcl 10-20 Mg Caps (Amlodipine besy-benazepril hcl) .... Take one by mouth daily 2)  Serevent Diskus 50 Mcg/dose Aepb (Salmeterol xinafoate) .... Take 2 puffs daily prn 3)  Claritin Tabs (Loratadine tabs) .... Take one by mouth daily prn 4)  Synthroid 112 Mcg Tabs  (Levothyroxine sodium) .[name].. 1 daily by mouth   Patient Instructions: 1)  Please schedule a follow-up appointment in 4 months.   ] Prior Medications (reviewed today): AMLODIPINE BESY-BENAZEPRIL HCL 10-20 MG CAPS (AMLODIPINE BESY-BENAZEPRIL HCL) Take one by mouth daily SEREVENT DISKUS 50 MCG/DOSE  AEPB (SALMETEROL XINAFOATE) Take 2 puffs daily prn CLARITIN   TABS (LORATADINE TABS) Take one by mouth daily prn SYNTHROID 112 MCG  TABS (LEVOTHYROXINE SODIUM) 1 daily by mouth Current Allergies (reviewed today): No known allergies

## 2010-03-27 NOTE — Assessment & Plan Note (Signed)
Summary: dr. Hetty Ely patient [name]   Vital Signs:  Patient profile:   50 year old male Height:      69 inches Weight:      191.75 pounds BMI:     28.42 Temp:     97.9 degrees F oral Pulse rate:   88 / minute Pulse rhythm:   regular BP sitting:   122 / 84  (left arm) Cuff size:   regular  Vitals Entered By: Delilah Shan CMA Duncan Dull) (October 29, 2009 8:13 AM) CC: RNS pt.   History of Present Illness: S/p tx for tick bite w/o current fevers, chills, rash.    Rhinitis started yesterday.  Typical for the fall season.  Using meds with some reilef prev, but hasn't started back on them yet .  Hypertension:      Using medication without problems or lightheadedness: no Chest pain with exertion:no Edema:no Short of breath: occ use of SABA for wheezing Average home BPs: not checked Other issues: little exercise, planning on starting back on home regimen  Hypothyroidism: No pain in throat, no dysphagia.  no ant neck pain.  No skin changes, no hair changes.  No sig fatigue.    Allergies: No Known Drug Allergies  Past History:  Past Surgical History: Last updated: 02/08/2008 Exercise stress test 03/07, negative 06/05 2 -D ECHO 06/04 attempted thyroid bx--08/19/07--no tissue obtained emergency appendectomy 10/09  Social History: Last updated: 10/29/2009 Marital Status: Married, 1989 Children: 1  Occupation:  working Automotive engineer "front" and writes orders--in [employer]  Past Medical History: Allergic rhinitis Hypertension Hypothyroidism heart murmur fatty liver with h/o elevated LFTs.   Family History: Father: Died when pt was age 69 of cancer with mets, site unknown, COPD Mother: Alive with asthma Siblings: Sister died with her "hearts stopping". MGM--colon cancer MGF--MI .10YRS OF AGE M uncle--ca --site unknown--09/2008 thinks was prostate  Social History: Reviewed history from 08/29/2007 and no changes required. Marital Status: Married, 1989 Children: 1   Occupation:  working Automotive engineer "front" and writes orders--in [place]  Review of Systems       See HPI.  Otherwise negative.    Physical Exam  General:  GEN: nad, alert and oriented HEENT: mucous membranes moist, nasal epithelium injected, OP wnl, TM wnl bilaterally NECK: supple w/o LA CV: rrr.  murmur noted PULM: ctab, no inc wob ABD: soft, +bs EXT: no edema SKIN: no acute rash    Impression & Recommendations:  Problem # 1:  HYPOTHYROIDISM (ICD-244.9) No change in meds.  see notes on labs.  His updated medication list for this problem includes:    Synthroid 112 Mcg Tabs (Levothyroxine sodium) .[name]... 1 daily by mouth  Orders: TLB-TSH (Thyroid Stimulating Hormone) (84443-TSH)  Problem # 2:  HYPERTENSION (ICD-401.9) No change in meds.  see notes on labs.  The following medications were removed from the medication list:    Lotrel 10-40 Mg Caps (Amlodipine besy-benazepril hcl) .[name]... Take 1 once daily for bp His updated medication list for this problem includes:    Amlodipine Besy-benazepril Hcl 10-20 Mg Caps (Amlodipine besy-benazepril hcl) .[name]... Take one by mouth daily  Orders: TLB-BMP (Basic Metabolic Panel-BMET) (80048-METABOL) TLB-Hepatic/Liver Function Pnl (80076-HEPATIC) TLB-Lipid Panel (80061-LIPID)  Problem # 3:  ALLERGIC RHINITIS (ICD-477.9) No change in meds, other than to restart typical fall meds as below.  The following medications were removed from the medication list:    Claritin Tabs (Loratadine tabs) .[name]... Take one by mouth daily as needed His updated medication list for  this problem includes:    Flonase 50 Mcg/act Susp (Fluticasone propionate) .[name]... 2 sprays each nostril as needed nasal congestion    Astepro 0.15 % Soln (Azelastine hcl) .[name]... 2 sprays each nostril two times a day  for allergy  Problem # 4:  BICUSPID AORTIC VALVE, MILD TO MOD AORTIC REGURGITATION (ICD-746.4) Refer back to cards for eval.  Orders: Cardiology Referral  (Cardiology)  Complete Medication List: 1)  Amlodipine Besy-benazepril Hcl 10-20 Mg Caps (Amlodipine besy-benazepril hcl) .... Take one by mouth daily 2)  Synthroid 112 Mcg Tabs (Levothyroxine sodium) .[name].. 1 daily by mouth 3)  Proair Hfa 108 (90 Base) Mcg/act Aers (Albuterol sulfate) .[name].. 1-2 puffs every 4 hrs as needed wheezing or chest tightness 4)  Flonase 50 Mcg/act Susp (Fluticasone propionate) .... 2 sprays each nostril as needed nasal congestion 5)  Serevent Diskus 50 Mcg/dose Aepb (Salmeterol xinafoate) .[name].. 1 puff two times a day in spring 6)  Astepro 0.15 % Soln (Azelastine hcl) .... 2 sprays each nostril two times a day  for allergy  Other Orders: Admin 1st Vaccine (04540) Flu Vaccine 106yrs + (98119)  Patient Instructions: 1)  We'll contact you with your lab report.  See Shirlee Limerick about your referral before your leave today.   Take care.  Try to increase your amount of exercise.  Prescriptions: ASTEPRO 0.15 % SOLN (AZELASTINE HCL) 2 sprays each nostril two times a day  for allergy  #3 x 3   Entered and Authorized by:   Crawford Givens MD   Signed by:   Crawford Givens MD on 10/29/2009   Method used:   [facility]        [facility] [id number]       [address]       [place], [place]  [address]       Ph: [phone] or [phone]       Fax: [phone]   RxID:   [id number] FLONASE 50 MCG/ACT SUSP (FLUTICASONE PROPIONATE) 2 sprays each nostril as needed nasal congestion  #3 x 3   Entered and Authorized by:   Crawford Givens MD   Signed by:   Crawford Givens MD on 10/29/2009   Method used:   [facility]        [facility] (retail)       [address]       [place], [place]  [address]       Ph: [phone] or [phone]       Fax: [phone]   RxID:   [id number] SYNTHROID 112 MCG  TABS (LEVOTHYROXINE SODIUM) 1 daily by mouth  #90 x 3   Entered and Authorized by:   Crawford Givens MD   Signed by:   Crawford Givens MD on  10/29/2009   Method used:   [facility]        [facility] [id number]       [address]       [place], [place]  [address]       Ph: [phone] or [phone]       Fax: [phone]   RxID:   [id number] AMLODIPINE BESY-BENAZEPRIL HCL 10-20 MG CAPS (AMLODIPINE BESY-BENAZEPRIL HCL) Take one by mouth daily  #90 x 3   Entered and Authorized by:   Crawford Givens MD   Signed by:   Crawford Givens MD on 10/29/2009   Method used:   [facility]        [facility] (retail)       [address]       [place], [place]  [address]       Ph: [phone] or [phone]       Fax: [phone]   RxID:   [id number]   Current Allergies (reviewed today): No known allergies    Flu Vaccine Consent Questions     Do you have a history of severe allergic reactions to this vaccine? no    Any prior history of allergic reactions to egg and/or gelatin? no    Do you have a sensitivity to the preservative Thimersol? no    Do you have a past history of Guillan-Barre Syndrome? no    Do you currently have an acute febrile illness? no    Have you ever had a severe reaction to latex? no    Vaccine information given and explained to patient? yes    Are you currently pregnant? no    Lot Number:[id number]   Exp Date:08/23/2010   Site Given  Left Deltoid IMlbflu Lugene Fuquay CMA (AAMA)  October 29, 2009 8:51 AM

## 2010-03-27 NOTE — Letter (Signed)
Summary: [place] [employer],Concealed Handgun Permits Form  [place] [employer],Concealed Handgun Permits Form   Imported By: Beau Fanny 05/07/2009 10:04:03  _____________________________________________________________________  External Attachment:    Type:   Image     Comment:   External Document

## 2010-03-27 NOTE — Progress Notes (Signed)
Summary: returned call  Phone Note Call from Patient   Caller: Patient [phone] Reason for Call: Talk to Nurse, Lab or Test Results Summary of Call: pt returning call re results Initial call taken by: Glynda Jaeger,  December 09, 2009 10:06 AM  Follow-up for Phone Call        pt given echo results Meredith Staggers, RN  December 09, 2009 11:12 AM

## 2010-03-27 NOTE — Assessment & Plan Note (Signed)
Summary: FU TIC BITE [facility]   Vital Signs:  Patient profile:   50 year old male Weight:      190.75 pounds BMI:     28.27 Temp:     98.3 degrees F oral Pulse rate:   76 / minute Pulse rhythm:   regular BP sitting:   130 / 74  (left arm) Cuff size:   large  Vitals Entered By: Sydell Axon LPN (September 25, 2009 4:13 PM) CC: Tick bite on right foot area is red and purple now   CC:  Tick bite on right foot area is red and purple now.  History of Present Illness: Noticed lesion starting on 6/22.  Went to [facility] 6/29.  Possible tick bite but no known history.  [facility] reviewed.  Lesion on foot has started to fade.  Occ nausea still, but this predates the medicine.  Occ achy feeling with occ hot/cold sensation at night but this is getting better.  no night sweats now.   Allergies: No Known Drug Allergies  Past History:  Past Medical History: Allergic rhinitis Hypertension Hypothyroidism heart murmur  Review of Systems       See HPI.  Otherwise negative.    Physical Exam  General:  GEN: nad, alert and oriented HEENT: mucous membranes moist NECK: supple w/o LA CV: rrr.  murmur noted- old finding PULM: ctab, no inc wob ABD: soft, +bs EXT: no edema SKIN: no acute rash , 6x8 cm blanching lesion resolving on R lateral foot.   Impression & Recommendations:  Problem # 1:  SKIN RASH (ICD-782.1) Possible tick associated illness, resolving.  D/w patient that testing would be of little use and he is already on the appropriate antibiotics.  He is getting better and lesion is fading.  would finish antibiotics and follow up as needed.  He agrees.  Tick associated illnesses d/w patient.   Complete Medication List: 1)  Amlodipine Besy-benazepril Hcl 10-20 Mg Caps (Amlodipine besy-benazepril hcl) .... Take one by mouth daily 2)  Claritin Tabs (Loratadine tabs) .... Take one by mouth daily as needed 3)  Synthroid 112 Mcg Tabs (Levothyroxine sodium) .[name].. 1 daily by mouth 4)   Proair Hfa 108 (90 Base) Mcg/act Aers (Albuterol sulfate) .[name].. 1-2 puffs every 4 hrs as needed wheezing or chest tightness 5)  Flonase 50 Mcg/act Susp (Fluticasone propionate) .... 2 sprays each nostril as needed nasal congestion 6)  Serevent Diskus 50 Mcg/dose Aepb (Salmeterol xinafoate) .[name].. 1 puff two times a day 7)  Astepro 0.15 % Soln (Azelastine hcl) .... 2 sprays each nostril two times a day  for allergy 8)  Lotrel 10-40 Mg Caps (Amlodipine besy-benazepril hcl) .... Take 1 once daily for bp 9)  Doxycycline Hyclate 100 Mg Tabs (Doxycycline hyclate) .... Take one by mouth two times a day  Patient Instructions: 1)  Let me know if you have any fevers or sweats or if the rash gets bigger.  I would finish the antibiotics and stay out of the sun while you are taking them.   Current Allergies (reviewed today): No known allergies

## 2010-03-27 NOTE — Assessment & Plan Note (Signed)
Summary: np6/bicuspid aortic valve/jml   Visit Type:  Initial Consult Primary Provider:  Crawford Givens MD  CC:  sob.  History of Present Illness: patient is a 50 year old who was referred for evaluation of AV. He has been seen in cardiology in the past. By his report he has a bicuspid valve.  He had a TEE and TTE at that time.  He has not been seen in cardiology since. OVerall he is active.  He does have some seasonal allergies.  Worst in the spring.  He denies significant SOB at other times.  No chest pain.  No palpitations.  No dizziness.  Current Medications (verified): 1)  Amlodipine Besy-Benazepril Hcl 10-20 Mg Caps (Amlodipine Besy-Benazepril Hcl) .... Take One By Mouth Daily 2)  Synthroid 112 Mcg  Tabs (Levothyroxine Sodium) .[name].. 1 Daily By Mouth 3)  Proair Hfa 108 (90 Base) Mcg/act Aers (Albuterol Sulfate) .[name].. 1-2 Puffs Every 4 Hrs As Needed Wheezing or Chest Tightness 4)  Flonase 50 Mcg/act Susp (Fluticasone Propionate) .... 2 Sprays Each Nostril As Needed Nasal Congestion 5)  Serevent Diskus 50 Mcg/dose Aepb (Salmeterol Xinafoate) .[name].. 1 Puff Two Times A Day in Spring 6)  Astepro 0.15 % Soln (Azelastine Hcl) .... 2 Sprays Each Nostril Two Times A Day  For Allergy  Allergies (verified): No Known Drug Allergies  Past History:  Past medical, surgical, family and social histories (including risk factors) reviewed, and no changes noted (except as noted below).  Past Medical History: Reviewed history from 10/29/2009 and no changes required. Allergic rhinitis Hypertension Hypothyroidism heart murmur fatty liver with h/o elevated LFTs.   Past Surgical History: Reviewed history from 02/08/2008 and no changes required. Exercise stress test 03/07, negative 06/05 2 -D ECHO 06/04 attempted thyroid bx--08/19/07--no tissue obtained emergency appendectomy 10/09  Family History: Reviewed history from 10/29/2009 and no changes required. Father: Died when pt was age 88 of cancer  with mets, site unknown, COPD Mother: Alive with asthma Siblings: Sister died with her "hearts stopping". MGM--colon cancer MGF--MI .52YRS OF AGE M uncle--ca --site unknown--09/2008 thinks was prostate  Social History: Reviewed history from 10/29/2009 and no changes required. Marital Status: Married, 1989 Children: 1  Occupation:  working Automotive engineer "front" and writes orders--in [place]  Review of Systems       All systems reviewed.  Neg to the above probme.  Vital Signs:  Patient profile:   50 year old male Height:      69 inches Weight:      193 pounds Pulse rate:   76 / minute BP sitting:   132 / 80  (left arm) Cuff size:   regular  Vitals Entered By: Burnett Kanaris, CNA (November 21, 2009 3:22 PM)  Physical Exam  Additional Exam:  patient is in NAD HEENT:  Normocephalic, atraumatic. EOMI, PERRLA.  Neck: JVP is normal. No thyromegaly. No bruits.  Lungs: clear to auscultation. No rales no wheezes.  Heart: Regular rate and rhythm. Normal S1, S2. No S3.   Gr II/VI systolic murmur base.   PMI not displaced.  Abdomen:  Supple, nontender. Normal bowel sounds. No masses. No hepatomegaly.  Extremities:   Good distal pulses throughout. No lower extremity edema.  Musculoskeletal :moving all extremities.  Neuro:   alert and oriented x3.    EKG  Procedure date:  11/21/2009  Findings:      NSR> 76 bpm.  Nonspecific ST T wave changes.  Impression & Recommendations:  Problem # 1:  BICUSPID AORTIC VALVE, MILD TO  MOD AORTIC REGURGITATION (ICD-746.4) Patient is a 68 yera old with history of bicuspid aortic valve.  Exam suggest that AS is very mild  I do not hear diastolic murmur I would recomm aan echo to evalluate the valve.   Otherwise I would plan to see him in 2 years for clinical evaluation.  Problem # 2:  PURE HYPERCHOLESTEROLEMIA (ICD-272.0) Discussed lipids.  He will work on diet.  Other Orders: Echocardiogram (Echo)  Patient Instructions: 1)  Your  physician recommends that you schedule a follow-up appointment in: TWO YEARS 2)  Your physician recommends that you continue on your current medications as directed. Please refer to the Current Medication list given to you today. 3)  Your physician has requested that you have an echocardiogram.  Echocardiography is a painless test that uses sound waves to create images of your heart. It provides your doctor with information about the size and shape of your heart and how well your heart's chambers and valves are working.  This procedure takes approximately one hour. There are no restrictions for this procedure.

## 2010-03-27 NOTE — Letter (Signed)
Summary: Historic Patient File/Notes from Dr. Terrill Mohr Report and  Historic Patient File/Notes from Dr. Terrill Mohr Report and Thoyroid Biopsy Report   Imported By: Mickle Asper 09/16/2007 14:27:54  _____________________________________________________________________  External Attachment:    Type:   Image     Comment:   External Document

## 2010-03-27 NOTE — Assessment & Plan Note (Signed)
Summary: 4 MONTH FOLLOW UP / LFW   Vital Signs:  Patient profile:   50 year old male Height:      69 inches Weight:      186 pounds BMI:     27.57 Temp:     98.2 degrees F oral Pulse rate:   76 / minute Pulse rhythm:   regular BP sitting:   130 / 88  (left arm) Cuff size:   regular  Vitals Entered By: Sydell Axon (May 30, 2008 8:46 AM)  CC:  4 month follow-up.  History of Present Illness: [place] for follow up of several problems --HBP--tolerating meds without side effects --thyroid--taking 112 micrograms--tolerating --shortness of breath--allergies have kicked in--nasal congestion, taking Alervert--no better than zyrtec which is not helping.  has used Nasacort in the past--worked, has not used Flonase.  --Proair using 2 puffs 2xqd for past 2wks---before that rarely.   has not restarted Serevent this alllergy season--shortness of breath, little chest tight, rare wheezing.  Nocturia x 3 for 65mo--prior was 2x night.  After supper has 2 glasses of water or soda.  Allergies: No Known Drug Allergies  Past History:  Past Medical History:    Reviewed history from 08/09/2006 and no changes required:    Allergic rhinitis    Hypertension    Hypothyroidism  Past Surgical History:    Reviewed history from 02/08/2008 and no changes required:    Exercise stress test 03/07, negative 06/05    2 -D ECHO 06/04    attempted thyroid bx--08/19/07--no tissue obtained    emergency appendectomy 10/09  Review of Systems CV:  Denies chest pain or discomfort, palpitations, swelling of feet, and swelling of hands. Resp:  Denies cough, shortness of breath, and wheezing. GI:  Denies nausea and vomiting. GU:  Complains of nocturia; new problem, less nocturia if reduces after dinner fluids. MS:  Complains of joint pain; not new, better since change in job 1+ yrs ago. Psych:  Denies anxiety and depression.  Physical Exam  General:  alert, well-developed, well-nourished, and well-hydrated.   NAD Ears:  R ear normal and L ear normal.   Nose:  boggy and edematus, no air movement on R only, sinuses tender throughout Mouth:  no exudates and pharyngeal erythema.   Neck:  no masses, no thyromegaly, no JVD, and no carotid bruits.   Lungs:  normal respiratory effort, no intercostal retractions, no accessory muscle use, and normal breath sounds.   Heart:  normal rate, regular rhythm, and no murmur.   Extremities:  no edema either lower leg Neurologic:  alert & oriented X3, sensation intact to light touch, and gait normal.   Psych:  normally interactive and good eye contact.     Impression & Recommendations:  Problem # 1:  SHORTNESS OF BREATH (ICD-786.05) Assessment Deteriorated increased shortness of breath rather than wheezing with onset of allergy season restart Serevent disk 1 two times a day continue Proair as needed--understands that if needs more than twice a week out of allergy season, needs to be seen  Problem # 2:  ALLERGIC RHINITIS (ICD-477.9) Assessment: Deteriorated little improvement with antihistamines will start on Astepro 2 two times a day--sample and Rx will start on flonase--call response, move to Nasocort if not improved His updated medication list for this problem includes:    Claritin Tabs (Loratadine tabs) .[name]... Take one by mouth daily as needed    Flonase 50 Mcg/act Susp (Fluticasone propionate) .[name]... 2 sprays each nostril as needed nasal congestion  Astepro 0.15 % Soln (Azelastine hcl) .[name]... 2 sprays each nostril two times a day  for allergy  Problem # 3:  HYPERTENSION (ICD-401.9) Assessment: Unchanged stable on current meds--continue labs as below see back in 6 mo or prn His updated medication list for this problem includes:    Amlodipine Besy-benazepril Hcl 10-20 Mg Caps (Amlodipine besy-benazepril hcl) .[name]... Take one by mouth daily  BP today: 130/88 Prior BP: 130/90 (02/08/2008)  Labs Reviewed: K+: 4.2 (08/29/2007) Creat: : 1.1 (08/29/2007)    Chol: 182 (08/29/2007)   HDL: 37.5 (08/29/2007)   LDL: 125 (08/29/2007)   TG: 99 (08/29/2007)  Problem # 4:  HYPOTHYROIDISM (ICD-244.9) Assessment: Unchanged TSH 0.82 on 12/09--stable on current dose of Synthroid--continue, has appt for recheck His updated medication list for this problem includes:    Synthroid 112 Mcg Tabs (Levothyroxine sodium) .[name]... 1 daily by mouth  Problem # 5:  NOCTURIA (ZOX-096.04) Assessment: New possible early BPH or overhydration in evening will reduce fluids after dinner will hold on prostate exam/meds until next visit--if continues will initiate  Complete Medication List: 1)  Amlodipine Besy-benazepril Hcl 10-20 Mg Caps (Amlodipine besy-benazepril hcl) .... Take one by mouth daily 2)  Claritin Tabs (Loratadine tabs) .... Take one by mouth daily as needed 3)  Synthroid 112 Mcg Tabs (Levothyroxine sodium) .[name].. 1 daily by mouth 4)  Proair Hfa 108 (90 Base) Mcg/act Aers (Albuterol sulfate) .[name].. 1-2 puffs every 4 hrs as needed wheezing or chest tightness 5)  Flonase 50 Mcg/act Susp (Fluticasone propionate) .... 2 sprays each nostril as needed nasal congestion 6)  Serevent Diskus 50 Mcg/dose Aepb (Salmeterol xinafoate) .[name].. 1 puff two times a day 7)  Astepro 0.15 % Soln (Azelastine hcl) .... 2 sprays each nostril two times a day  for allergy  Patient Instructions: 1)  Please schedule a follow-up appointment in 4 months. Prescriptions: PROAIR HFA 108 (90 BASE) MCG/ACT AERS (ALBUTEROL SULFATE) 1-2 puffs every 4 hrs as needed wheezing or chest tightness  #1 x 1   Entered and Authorized by:   Gildardo Griffes FNP   Signed by:   Gildardo Griffes FNP on 05/30/2008   Method used:   [facility]        [facility] [id number]       [address]       [place], [place]  [address]       Ph: [phone] or [phone]       Fax: [phone]   RxID:   [id number] ASTEPRO 0.15 % SOLN (AZELASTINE HCL) 2 sprays each nostril two  times a day  for allergy  #1 x 6   Entered and Authorized by:   Gildardo Griffes FNP   Signed by:   Gildardo Griffes FNP on 05/30/2008   Method used:   [facility]        [facility] (retail)       [address]       [place], [place]  [address]       Ph: [phone] or [phone]       Fax: [phone]   RxID:   [id number] SEREVENT DISKUS 50 MCG/DOSE AEPB (SALMETEROL XINAFOATE) 1 puff two times a day  #1 x 6   Entered and Authorized by:   Gildardo Griffes FNP   Signed by:   Gildardo Griffes FNP on 05/30/2008   Method used:   [facility]        [facility] [id number]       [address]       [place], [place]  [address]       Ph: [phone] or [phone]       Fax: [phone]   RxID:   [id number] FLONASE 50 MCG/ACT SUSP (FLUTICASONE PROPIONATE) 2 sprays each nostril as needed nasal congestion  #1 x 6   Entered and Authorized by:   Gildardo Griffes FNP   Signed by:   Gildardo Griffes FNP on 05/30/2008   Method used:   [facility]        [facility] (retail)       [address]       [place], [place]  [address]       Ph: [phone] or [phone]       Fax: [phone]   RxID:   [id number]   Current Allergies (reviewed today): No known allergies

## 2010-03-27 NOTE — Assessment & Plan Note (Signed)
Summary: TICK BITE  CYD   Vital Signs:  Patient profile:   50 year old male Height:      69 inches Weight:      192.75 pounds BMI:     28.57 Temp:     98.2 degrees F oral Pulse rate:   80 / minute Pulse rhythm:   regular BP sitting:   136 / 80  (left arm) Cuff size:   regular  Vitals Entered By: Delilah Shan CMA Duncan Dull) (February 20, 2010 3:09 PM) CC: Tick bite   History of Present Illness: Tick bite noted by patient.  It was embedded.  It was probably there for about 1 week per patient.  He got it off last night.  There was some erythema in the area.  No fevers or new joint aches.  No other rash, other than the changes at the tick site.   Allergies: No Known Drug Allergies  Review of Systems       See HPI.  Otherwise negative.    Physical Exam  General:  no apparent distress normocephalic atraumatic mucous membranes moist regular rate and rhythm clear to auscultation bilaterally skin w/o rash except for 7x9mm erythema near L axilla.  there is a white patch noted centrally ( ~1-21mm across) but this doesn't appear pustular.     Impression & Recommendations:  Problem # 1:  TICK BITE (ICD-E906.4) I would treat given the duration of the attachment (presumed).  I don't think he has an active cellulitis- this may just be inflammatory.  follow up as needed, esp if fever, aches etc.  he agrees.   Orders: Prescription Created Electronically [phone])  Complete Medication List: 1)  Amlodipine Besy-benazepril Hcl 10-20 Mg Caps (Amlodipine besy-benazepril hcl) .... Take one by mouth daily 2)  Synthroid 112 Mcg Tabs (Levothyroxine sodium) .[name].. 1 daily by mouth 3)  Proair Hfa 108 (90 Base) Mcg/act Aers (Albuterol sulfate) .[name].. 1-2 puffs every 4 hrs as needed wheezing or chest tightness 4)  Flonase 50 Mcg/act Susp (Fluticasone propionate) .... 2 sprays each nostril as needed nasal congestion 5)  Serevent Diskus 50 Mcg/dose Aepb (Salmeterol xinafoate) .[name].. 1 puff two times a day in  spring 6)  Astepro 0.15 % Soln (Azelastine hcl) .... 2 sprays each nostril two times a day  for allergy 7)  Doxycycline Hyclate 100 Mg Caps (Doxycycline hyclate) .[name].. 1 by mouth two times a day  Patient Instructions: 1)  I would start the antibiotics today.  Let me know if you have other symptoms in the meantime.  Take care.  Prescriptions: DOXYCYCLINE HYCLATE 100 MG CAPS (DOXYCYCLINE HYCLATE) 1 by mouth two times a day  #20 x 0   Entered and Authorized by:   Crawford Givens MD   Signed by:   Crawford Givens MD on 02/20/2010   Method used:   [facility]        [facility]  [address] (retail)       [address]       [place], [place]  [address]       Ph: [phone] or [phone]       Fax: [phone]   RxID:   [id number]    Orders Added: 1)  Prescription Created Electronically [G8553] 2)  Est. Patient Level III [66440]    Current Allergies (reviewed today): No known allergies

## 2010-03-27 NOTE — Assessment & Plan Note (Signed)
Summary: 6 M F/U DLO   Vital Signs:  Patient profile:   50 year old male Height:      69 inches Weight:      189.25 pounds BMI:     28.05 Temp:     98.3 degrees F oral Pulse rate:   64 / minute Pulse rhythm:   regular BP sitting:   144 / 86  (left arm) Cuff size:   regular  Vitals Entered By: Lewanda Rife LPN (October 09, 2008 8:13 AM)  CC:  4 month follow up.  History of Present Illness: Here for follow up of several problems: --hypothyroid--no fatigue, tolerating current dose of Synthroid without side effects  --HBP--tolerating current meds without side effects   --elevated lipids--on diet control only, uses fish oil occasionally  --elevated glucose--not doing low CHO diet   --aortic valve --regurg--last 2D echo was "quite a while"  --allergic rhinitis--best season yet on Astepro--works---April to first of June needed Serevent only in allergy season--works less "colds" during the last winter--took flu shot fo rthe first time uses Proair on as needed basis--some months none, others up to 2x/mo with allergy season  Preventive Screening-Counseling & Management  Alcohol-Tobacco     Alcohol drinks/day: <1     Alcohol type: mixed drink     Smoking Status: never  Caffeine-Diet-Exercise     Caffeine use/day: 4     Does Patient Exercise: no  Allergies (verified): No Known Drug Allergies  Past History:  Past Medical History: Reviewed history from 08/09/2006 and no changes required. Allergic rhinitis Hypertension Hypothyroidism  Family History: Father: Died when he was age 60 of cancer with mets, site unknown, COPD, hx of partonitis Mother: Alive with asthma Siblings: Sister died with her "hearts stopping". MGM--colon cancer MGF--MI .49YRS OF AGE M uncle--ca --site unknown--09/2008 thinks was prostate  Social History: Caffeine use/day:  4  Review of Systems CV:  Denies chest pain or discomfort, palpitations, swelling of feet, and swelling of hands. Resp:   Denies cough, shortness of breath, and wheezing. GI:  Denies nausea and vomiting. MS:  Complains of joint pain and stiffness; no new. Psych:  Denies anxiety and depression.  Physical Exam  General:  alert, well-developed, well-nourished, and well-hydrated.   Head:  normocephalic and atraumatic.   Eyes:  pupils equal, pupils round, and no injection.   Neck:  no thyromegaly, no JVD, and no carotid bruits.   Lungs:  normal respiratory effort, no intercostal retractions, no accessory muscle use, and normal breath sounds.   Heart:  normal rate, regular rhythm, and no murmur.   Extremities:  no edema either lower leg Neurologic:  alert & oriented X3, strength normal in all extremities, sensation intact to light touch, and gait normal.   Psych:  normally interactive and good eye contact.     Impression & Recommendations:  Problem # 1:  HYPERTENSION (ICD-401.9) Assessment Unchanged  increase lotrel dose to 10-40 as has been at top of accectable BP range off and on recently see back in 6 mo to follow reviesed labs from spring His updated medication list for this problem includes:    Amlodipine Besy-benazepril Hcl 10-20 Mg Caps (Amlodipine besy-benazepril hcl) .[name]... Take one by mouth daily    Lotrel 10-40 Mg Caps (Amlodipine besy-benazepril hcl) .[name]... Take 1 once daily for bp  Orders: TLB-BMP (Basic Metabolic Panel-BMET) (80048-METABOL)  BP today: 144/86 Prior BP: 130/88 (05/30/2008)  Labs Reviewed: K+: 4.2 (08/29/2007) Creat: : 1.1 (08/29/2007)   Chol: 182 (08/29/2007)  HDL: 37.5 (08/29/2007)   LDL: 125 (08/29/2007)   TG: 99 (08/29/2007)  Problem # 2:  HYPERGLYCEMIA (ICD-790.29) Assessment: Unchanged stable at present--continue to follow encouraged 5 lbs off and low simple CHO diet Labs Reviewed: Creat: 1.1 (08/29/2007)     Problem # 3:  PURE HYPERCHOLESTEROLEMIA (ICD-272.0) Assessment: Unchanged stable on low fat diet--continue, follow Orders: Venipuncture  (62952) TLB-Lipid Panel (80061-LIPID) TLB-Hepatic/Liver Function Pnl (80076-HEPATIC)  Labs Reviewed: SGOT: 82 (08/29/2007)   SGPT: 114 (08/29/2007)   HDL:37.5 (08/29/2007), 35.7 (02/14/2007)  LDL:125 (08/29/2007), 94 (84/13/2440)  Chol:182 (08/29/2007), 149 (02/14/2007)  Trig:99 (08/29/2007), 96 (02/14/2007)  Problem # 4:  FATTY LIVER DISEASE BY U/S, ELEVATED LFT'S (ICD-571.8) Assessment: Comment Only currently stable--continue to follow  Problem # 5:  HYPOTHYROIDISM (ICD-244.9) Assessment: Unchanged TSH was 0.82 on 12/09 labs will get TSH today to monitor--titrate if needed His updated medication list for this problem includes:    Synthroid 112 Mcg Tabs (Levothyroxine sodium) .[name]... 1 daily by mouth  Orders: TLB-TSH (Thyroid Stimulating Hormone) (84443-TSH)  Problem # 6:  ALLERGIC RHINITIS (ICD-477.9) Assessment: Improved doing well on Astepro--continue see back as needed His updated medication list for this problem includes:    Claritin Tabs (Loratadine tabs) .[name]... Take one by mouth daily as needed    Flonase 50 Mcg/act Susp (Fluticasone propionate) .[name]... 2 sprays each nostril as needed nasal congestion    Astepro 0.15 % Soln (Azelastine hcl) .[name]... 2 sprays each nostril two times a day  for allergy  Complete Medication List: 1)  Amlodipine Besy-benazepril Hcl 10-20 Mg Caps (Amlodipine besy-benazepril hcl) .... Take one by mouth daily 2)  Claritin Tabs (Loratadine tabs) .... Take one by mouth daily as needed 3)  Synthroid 112 Mcg Tabs (Levothyroxine sodium) .[name].. 1 daily by mouth 4)  Proair Hfa 108 (90 Base) Mcg/act Aers (Albuterol sulfate) .[name].. 1-2 puffs every 4 hrs as needed wheezing or chest tightness 5)  Flonase 50 Mcg/act Susp (Fluticasone propionate) .... 2 sprays each nostril as needed nasal congestion 6)  Serevent Diskus 50 Mcg/dose Aepb (Salmeterol xinafoate) .[name].. 1 puff two times a day 7)  Astepro 0.15 % Soln (Azelastine hcl) .... 2 sprays each nostril two times a day   for allergy 8)  Lotrel 10-40 Mg Caps (Amlodipine besy-benazepril hcl) .... Take 1 once daily for bp  Other Orders: Tdap => 77yrs IM (10272) Admin 1st Vaccine (53664) Admin 1st Vaccine (State) [phone]) TLB-PSA (Prostate Specific Antigen) (84153-PSA)  Patient Instructions: 1)  Please schedule a follow-up appointment in 6 months. Prescriptions: LOTREL 10-40 MG CAPS (AMLODIPINE BESY-BENAZEPRIL HCL) take 1 once daily for BP  #30 x 6   Entered and Authorized by:   Gildardo Griffes FNP   Signed by:   Lewanda Rife LPN on 25/95/6387   Method used:   [facility]        [facility]  [address] [facility]ret[facility]       [address]       [place], [place]  [address]       Ph: [phone] or [phone]       Fax: [phone]   RxID:   [id number]   Current Allergies (reviewed today): No known allergies      Tetanus/Td Vaccine    Vaccine Type: Tdap    Site: left deltoid    Mfr: GlaxoSmithKline    Dose: 0.5 ml    Route: IM    Given by: Lewanda Rife LPN    Exp. Date: 12/27/2010    Lot #: [id number]  VIS given: 01/11/07 version given October 09, 2008.

## 2010-03-27 NOTE — Miscellaneous (Signed)
Summary: thyroid bx attempted 6/09  Clinical Lists Changes  Observations: Added new observation of PAST SURG HX: Exercise stress test 03/07, negative 06/05 2 -D ECHO 06/04 attempted thyroid bx--08/19/07--no tissue obtained  (09/29/2007 17:39)       Past Surgical History:    Exercise stress test 03/07, negative 06/05    2 -D ECHO 06/04    attempted thyroid bx--08/19/07--no tissue obtained

## 2010-03-27 NOTE — Assessment & Plan Note (Signed)
Summary: SHARP PAIN ON RIGHT SIDE/NML   Vital Signs:  Patient Profile:   50 Years Old Male Height:     69 inches Weight:      189.75 pounds Temp:     97.9 degrees F oral Pulse rate:   73 / minute BP sitting:   116 / 70  (left arm) Cuff size:   large  Vitals Entered By: Shonna Chock (December 03, 2007 11:20 AM)                 Chief Complaint:  RIGHT LOWER ABDOMINAL PAIN X 1 DAY.  History of Present Illness: 50 y/o male awoke @ 1 am with severe rlq pain.  nausea w/o vomiting Ros neg    Current Allergies (reviewed today): No known allergies   Past Medical History:    Reviewed history from 08/09/2006 and no changes required:       Allergic rhinitis       Hypertension       Hypothyroidism     Review of Systems      See HPI   Physical Exam  Abdomen:     bowel sounds hypoactive, distended, and rebound tenderness.      Impression & Recommendations:  Problem # 1:  RLQ PAIN (ICD-789.03)  Orders: UA Dipstick w/o Micro (manual) (21308)   Complete Medication List: 1)  Amlodipine Besy-benazepril Hcl 10-20 Mg Caps (Amlodipine besy-benazepril hcl) .... Take one by mouth daily 2)  Serevent Diskus 50 Mcg/dose Aepb (Salmeterol xinafoate) .... Take 2 puffs daily prn 3)  Claritin Tabs (Loratadine tabs) .... Take one by mouth daily prn 4)  Synthroid 112 Mcg Tabs (Levothyroxine sodium) .[name].. 1 daily by mouth   Patient Instructions: 1)  to [facility] er now, prob appendicitis   ] Laboratory Results   Urine Tests   Date/Time Reported: December 03, 2007 11:22 AM   Routine Urinalysis   Color: straw Appearance: Clear Glucose: negative   (Normal Range: Negative) Bilirubin: negative   (Normal Range: Negative) Ketone: negative   (Normal Range: Negative) Spec. Gravity: 1.015   (Normal Range: 1.003-1.035) Blood: negative   (Normal Range: Negative) pH: 6.0   (Normal Range: 5.0-8.0) Protein: negative   (Normal Range: Negative) Urobilinogen: 0.2   (Normal Range:  0-1) Nitrite: negative   (Normal Range: Negative) Leukocyte Esterace: negative   (Normal Range: Negative)

## 2010-07-08 NOTE — Op Note (Signed)
NAMEREINHARD, SCHACK                ACCOUNT NO.:  [id number]   MEDICAL RECORD NO.:  [id number]          PATIENT TYPE:  INP   LOCATION:  0098                         FACILITY:  [facility]   PHYSICIAN:  Adolph Pollack, M.D.DATE OF BIRTH:  [birth year]   DATE OF PROCEDURE:  12/03/2007  DATE OF DISCHARGE:                               OPERATIVE REPORT   PREOPERATIVE DIAGNOSIS:  Acute appendicitis.   POSTOPERATIVE DIAGNOSIS:  Acute appendicitis.   PROCEDURE:  Laparoscopic appendectomy.   SURGEON:  Adolph Pollack, M.D.   ANESTHESIA:  General.   INDICATIONS:  This 50 year old male presented to the emergency  department with right lower quadrant pain and was found to have acute  appendicitis and now presents for laparoscopic appendectomy.   TECHNIQUE:  He was brought to the operating room, placed supine on the  operating table, and a general anesthetic was administered.  A Foley  catheter was inserted and the hair on the abdominal wall was clipped.  The abdominal wall sterilely prepped and draped.  Marcaine was  infiltrated in the subumbilical region.  A small subumbilical incision  was made through the skin, subcutaneous tissue, fascia and peritoneum,  entering the peritoneal cavity under direct vision.  A pursestring  suture of 0 Vicryl was placed around the fascial edges.  A Hasson trocar  was introduced into the peritoneal cavity and pneumoperitoneum created  by insufflation of CO2 gas.   Next a laparoscope was introduced.  I then placed a 5-mm trocar in the  left lower quadrant region just to the left the midline.  I manipulated  the cecum, which had some adhesions to the lateral abdominal wall, and  noted an inflamed but not perforated appendix that was partly  retrocecal.  I placed a 5-mm trocar in the right upper quadrant.  I then  divided the adhesions between the cecum and lateral abdominal wall,  mobilizing the cecum.  I was able then to grasp the mesoappendix and  then  dissect the mesoappendix off its retrocecal attachments using the  Harmonic Scalpel.  I then divided the mesoappendix down to the base of  the appendix using the Harmonic Scalpel.  I was then able to retract the  appendix directly anterior.   By using the endoscopic stapler, the appendix was amputated off the  cecum and then placed an Endopouch bag.  It was then removed through the  subumbilical port.  The subumbilical port was then replaced.   I copiously irrigated out the abdominal cavity and noted there was a  little bit of bleeding from the staple line, which I was able to stop  with hemoclip.  I then irrigated more around that area and hemostasis  was adequate.  I evacuated the irrigation fluid as much as possible.   Following this I removed the left lower quadrant trocar and no bleeding  was noted.  I removed the subumbilical trocar and repaired the  subumbilical fascial defect by tightening up and tying down the  pursestring suture.  I then released the CO2 gas and removed the  remaining trocar.   Skin incisions  were closed with 4-0 Monocryl subcuticular stitches,  followed by Steri-Strips and sterile dressings.  He tolerated the  procedure without any apparent complications and was taken to the  recovery room in satisfactory condition.      Adolph Pollack, M.D.  Electronically Signed     TJR/MEDQ  D:  12/03/2007  T:  12/05/2007  Job:  [id number]

## 2010-07-08 NOTE — H&P (Signed)
[name], [name]                ACCOUNT NO.:  [id number]   MEDICAL RECORD NO.:  [id number]          PATIENT TYPE:  EMS   LOCATION:  ED                           FACILITY:  [facility]   PHYSICIAN:  Adolph Pollack, M.D.DATE OF BIRTH:  1960-03-19   DATE OF ADMISSION:  12/03/2007  DATE OF DISCHARGE:                              HISTORY & PHYSICAL   CHIEF COMPLAINT:  Right lower quadrant pain.   PRESENT ILLNESS:  This is a 50 year old male who awoke this morning at 2  o'clock with some sharp pain in the right lower quadrant that has  progressively worsened.  He has had some waves of nausea and anorexia,  but no fever or chills.  Normal bowel movement was this morning.  Because the pain did not improve, he presented to the emergency  department today around noon.  He was evaluated and noted to have an  elevation of white blood cell count.  He subsequently underwent a CT  scan.  This demonstrated findings consistent with acute appendicitis  without rupture.  I subsequently was asked to see him.   PAST MEDICAL HISTORY:  1. Hypertension.  2. Bicuspid aortic valve.  3. Hypothyroidism.   PREVIOUS OPERATIONS:  None.   ALLERGIES:  None.   MEDICATIONS:  Lotrel, Synthroid.   SOCIAL HISTORY:  Rare alcohol use.  Nonsmoker.   FAMILY HISTORY:  Noncontributory to his current condition.   REVIEW OF SYSTEMS:  HEART: No heart attacks. but has bicuspid aortic  valve.  PULMONARY:  He said he may have some allergy induced asthma and  takes his Serevent inhaler for this as needed.  No pneumonia.  GI: No  peptic ulcer disease, hepatitis, diverticulitis.  GU:  No kidney stones,  dysuria, hematuria.  ENDOCRINE:  No diabetes or hypercholesterolemia.  NEUROLOGIC:  No strokes or seizures.  HEMATOLOGIC:  No bleeding  disorder, blood clots.   PHYSICAL EXAM:  GENERAL:  Well-developed, well-nourished male who  appears to be in no acute distress, pleasant and cooperative.  Temperature is 98 degrees, blood  pressure is 140/92, pulse 77, O2  saturations 96% on room air.  EYES:  Extraocular motion intact.  No icterus.  NECK: Supple without masses.  RESPIRATORY:  Breath sounds equal and clear.  Respirations unlabored.  CARDIOVASCULAR:  Heart demonstrates regular rate and regular rhythm.  No  lower extremity edema.  ABDOMEN:  Soft.  There is right lower quadrant tenderness and guarding  to palpation and percussion.  No obvious mass.  MUSCULOSKELETAL:  Good muscle tone, range of motion.  SKIN:  No jaundice.   LABORATORY DATA:  Urinalysis is negative.  CBC is notable for hemoglobin  of 17.5, white count of 17,600.  Electrolytes within normal limits.  CT  scan was reviewed.   IMPRESSION:  Acute appendicitis.  Does not appear perforated at this  time.   PLAN:  Laparoscopic, possible open, appendectomy.  We will also explain  the procedure and the risks to him.  Risks include, but are not limited  to, bleeding, infection, wound healing problems, anesthesia, accidental  injury to intra-abdominal organs, small intestine  or bladder ,etc.  He  seems to understand this and agrees with the plan.  I will cover him  with broad-spectrum antibiotic of Zosyn for endocarditis prophylaxis.      Adolph Pollack, M.D.  Electronically Signed     TJR/MEDQ  D:  12/03/2007  T:  12/04/2007  Job:  [id number]   cc:   Arta Silence, MD  Fax: [phone]

## 2010-11-21 ENCOUNTER — Other Ambulatory Visit: Payer: Self-pay | Admitting: Family Medicine

## 2010-11-21 DIAGNOSIS — I10 Essential (primary) hypertension: Secondary | ICD-10-CM

## 2010-11-21 DIAGNOSIS — E039 Hypothyroidism, unspecified: Secondary | ICD-10-CM

## 2010-11-21 NOTE — Telephone Encounter (Signed)
Patient last OV 02/20/2010, please advise.

## 2010-11-23 NOTE — Telephone Encounter (Signed)
Please schedule for CPE this fall.  Thanks.

## 2010-11-24 NOTE — Telephone Encounter (Signed)
LMOVM to schedule CPE. 

## 2010-11-25 LAB — DIFFERENTIAL
Basophils Absolute: 0.1
Basophils Relative: 1
Eosinophils Absolute: 0.2
Eosinophils Relative: 1
Lymphocytes Relative: 11 — ABNORMAL LOW
Lymphs Abs: 1.8
Monocytes Absolute: 0.8
Monocytes Relative: 5
Neutro Abs: 14.6 — ABNORMAL HIGH
Neutrophils Relative %: 83 — ABNORMAL HIGH

## 2010-11-25 LAB — CBC
HCT: 51.1
Hemoglobin: 17.5 — ABNORMAL HIGH
MCHC: 34.2
MCV: 88.8
Platelets: 340
RBC: 5.76
RDW: 12.4
WBC: 17.6 — ABNORMAL HIGH

## 2010-11-25 LAB — BASIC METABOLIC PANEL
BUN: 15
CO2: 27
Calcium: 10.3
Chloride: 100
Creatinine, Ser: 1.06
GFR calc Af Amer: >60
GFR calc non Af Amer: >60
Glucose, Bld: 99
Potassium: 4.5
Sodium: 136

## 2010-11-25 LAB — URINALYSIS, ROUTINE W REFLEX MICROSCOPIC
Bilirubin Urine: NEGATIVE
Glucose, UA: NEGATIVE
Ketones, ur: NEGATIVE
Leukocytes, UA: NEGATIVE
Nitrite: NEGATIVE
Protein, ur: NEGATIVE
Specific Gravity, Urine: 1.024
Urobilinogen, UA: 1
pH: 5

## 2010-11-25 LAB — URINE MICROSCOPIC-ADD ON

## 2011-02-02 ENCOUNTER — Ambulatory Visit (INDEPENDENT_AMBULATORY_CARE_PROVIDER_SITE_OTHER): Payer: BC Managed Care – PPO

## 2011-02-02 DIAGNOSIS — L0390 Cellulitis, unspecified: Secondary | ICD-10-CM

## 2011-02-02 DIAGNOSIS — L0291 Cutaneous abscess, unspecified: Secondary | ICD-10-CM

## 2011-02-13 ENCOUNTER — Other Ambulatory Visit (INDEPENDENT_AMBULATORY_CARE_PROVIDER_SITE_OTHER): Payer: Self-pay

## 2011-02-13 DIAGNOSIS — I10 Essential (primary) hypertension: Secondary | ICD-10-CM

## 2011-02-13 DIAGNOSIS — E039 Hypothyroidism, unspecified: Secondary | ICD-10-CM

## 2011-02-13 LAB — COMPREHENSIVE METABOLIC PANEL
ALT: 110 U/L — ABNORMAL HIGH (ref 0–53)
AST: 72 U/L — ABNORMAL HIGH (ref 0–37)
Albumin: 4.6 g/dL (ref 3.5–5.2)
Alkaline Phosphatase: 113 U/L (ref 39–117)
BUN: 15 mg/dL (ref 6–23)
CO2: 27 mEq/L (ref 19–32)
Calcium: 9.7 mg/dL (ref 8.4–10.5)
Chloride: 103 mEq/L (ref 96–112)
Creatinine, Ser: 1.1 mg/dL (ref 0.4–1.5)
GFR: 77.33 mL/min (ref >60.00)
Glucose, Bld: 103 mg/dL — ABNORMAL HIGH (ref 70–99)
Potassium: 4.6 mEq/L (ref 3.5–5.1)
Sodium: 139 mEq/L (ref 135–145)
Total Bilirubin: 1 mg/dL (ref 0.3–1.2)
Total Protein: 8.2 g/dL (ref 6.0–8.3)

## 2011-02-13 LAB — LIPID PANEL
Cholesterol: 180 mg/dL (ref 0–200)
HDL: 47.8 mg/dL (ref >39.00)
LDL Cholesterol: 113 mg/dL — ABNORMAL HIGH (ref 0–99)
Total CHOL/HDL Ratio: 4
Triglycerides: 98 mg/dL (ref 0.0–149.0)
VLDL: 19.6 mg/dL (ref 0.0–40.0)

## 2011-02-13 LAB — TSH: TSH: 0.38 u[IU]/mL (ref 0.35–5.50)

## 2011-02-19 ENCOUNTER — Encounter: Payer: Self-pay | Admitting: Family Medicine

## 2011-02-20 ENCOUNTER — Ambulatory Visit (INDEPENDENT_AMBULATORY_CARE_PROVIDER_SITE_OTHER): Payer: Self-pay | Admitting: Family Medicine

## 2011-02-20 ENCOUNTER — Encounter: Payer: Self-pay | Admitting: Family Medicine

## 2011-02-20 VITALS — BP 112/76 | HR 68 | Temp 98.1°F | Wt 191.8 lb

## 2011-02-20 DIAGNOSIS — Q231 Congenital insufficiency of aortic valve: Secondary | ICD-10-CM

## 2011-02-20 DIAGNOSIS — J309 Allergic rhinitis, unspecified: Secondary | ICD-10-CM

## 2011-02-20 DIAGNOSIS — I10 Essential (primary) hypertension: Secondary | ICD-10-CM

## 2011-02-20 DIAGNOSIS — R7309 Other abnormal glucose: Secondary | ICD-10-CM

## 2011-02-20 DIAGNOSIS — Z0000 Encounter for general adult medical examination without abnormal findings: Secondary | ICD-10-CM

## 2011-02-20 DIAGNOSIS — Z8042 Family history of malignant neoplasm of prostate: Secondary | ICD-10-CM

## 2011-02-20 DIAGNOSIS — E039 Hypothyroidism, unspecified: Secondary | ICD-10-CM

## 2011-02-20 DIAGNOSIS — E7800 Pure hypercholesterolemia, unspecified: Secondary | ICD-10-CM

## 2011-02-20 LAB — PSA: PSA: 0.77 ng/mL (ref 0.10–4.00)

## 2011-02-20 MED ORDER — AZELASTINE HCL 0.15 % NA SOLN: 1.0000 | Freq: Every day | NASAL | Status: DC

## 2011-02-20 MED ORDER — LEVOTHYROXINE SODIUM 112 MCG PO TABS: 112.0000 ug | ORAL_TABLET | Freq: Every day | ORAL | Status: DC

## 2011-02-20 MED ORDER — AMLODIPINE BESY-BENAZEPRIL HCL 10-20 MG PO CAPS: 1.0000 | ORAL_CAPSULE | Freq: Every day | ORAL | Status: DC

## 2011-02-20 MED ORDER — FLUTICASONE PROPIONATE 50 MCG/ACT NA SUSP: 1.0000 | Freq: Every day | NASAL | Status: DC

## 2011-02-20 MED ORDER — ALBUTEROL SULFATE HFA 108 (90 BASE) MCG/ACT IN AERS: 2.0000 | INHALATION_SPRAY | Freq: Four times a day (QID) | RESPIRATORY_TRACT | Status: DC | PRN

## 2011-02-20 NOTE — Progress Notes (Signed)
He was seen at [facility] a few weeks ago with R 4th finger cellulitis, tx'd with septra.  Less red now, still with some residual irritation.    Asthma. Rare SABA use and no serevent use.  No nighttime cough, no wheeze.  Occ postnasal gtt and cough from that.    Seasonal allergies.  Using nasal sprays during allergy season only.    CPE- See plan.  Routine anticipatory guidance given to patient.  See health maintenance.  Hypertension:    Using medication without problems or lightheadedness: yes Chest pain with exertion:no Edema:no Short of breath:no Exercise- "not a lot"  Hypothyroid, compliant with meds, no neck pain/mass/swelling.    PMH and SH reviewed  Meds, vitals, and allergies reviewed.   ROS: See HPI.  Otherwise negative.    GEN: nad, alert and oriented HEENT: mucous membranes moist NECK: supple w/o LA CV: rrr. Murmur noted.  PULM: ctab, no inc wob ABD: soft, +bs EXT: no edema SKIN: no acute rash Prostate gland firm and smooth, no enlargement, nodularity, tenderness, mass, asymmetry or induration.

## 2011-02-20 NOTE — Patient Instructions (Signed)
Let me know if you need the albuterol frequently or if you have other concerns.  I would exercise more- the elliptical would be a good idea.  Take care.  Recheck labs in 1 year at a physical. You can get your results through our phone system.  Follow the instructions on the blue card.

## 2011-02-25 ENCOUNTER — Encounter: Payer: Self-pay | Admitting: Family Medicine

## 2011-02-25 DIAGNOSIS — Z0000 Encounter for general adult medical examination without abnormal findings: Secondary | ICD-10-CM | POA: Insufficient documentation

## 2011-02-25 DIAGNOSIS — Q231 Congenital insufficiency of aortic valve: Secondary | ICD-10-CM | POA: Insufficient documentation

## 2011-02-25 DIAGNOSIS — Z8042 Family history of malignant neoplasm of prostate: Secondary | ICD-10-CM | POA: Insufficient documentation

## 2011-02-25 NOTE — Assessment & Plan Note (Signed)
With fatty liver, work on diet and exercise, weight.

## 2011-02-25 NOTE — Assessment & Plan Note (Signed)
Normal exam and PSA.

## 2011-02-25 NOTE — Assessment & Plan Note (Signed)
Work on diet, weight.

## 2011-02-25 NOTE — Assessment & Plan Note (Signed)
Colon CA screening at 52.  Flu and td up to date.  Healthy habits encouraged.

## 2011-02-25 NOTE — Assessment & Plan Note (Signed)
Per cards  

## 2011-02-25 NOTE — Assessment & Plan Note (Signed)
Cont current meds prn.

## 2011-02-25 NOTE — Assessment & Plan Note (Signed)
TSH wnl, no change in meds.  

## 2011-02-25 NOTE — Assessment & Plan Note (Signed)
Work on diet, exercise, no change in meds.  Labs d/w pt.

## 2011-09-14 ENCOUNTER — Ambulatory Visit (INDEPENDENT_AMBULATORY_CARE_PROVIDER_SITE_OTHER): Payer: BC Managed Care – PPO | Admitting: Family Medicine

## 2011-09-14 ENCOUNTER — Encounter: Payer: Self-pay | Admitting: Family Medicine

## 2011-09-14 VITALS — BP 128/82 | HR 77 | Temp 98.1°F | Wt 196.0 lb

## 2011-09-14 DIAGNOSIS — R0989 Other specified symptoms and signs involving the circulatory and respiratory systems: Secondary | ICD-10-CM

## 2011-09-14 DIAGNOSIS — G479 Sleep disorder, unspecified: Secondary | ICD-10-CM

## 2011-09-14 DIAGNOSIS — G4733 Obstructive sleep apnea (adult) (pediatric): Secondary | ICD-10-CM

## 2011-09-14 DIAGNOSIS — R0609 Other forms of dyspnea: Secondary | ICD-10-CM

## 2011-09-14 DIAGNOSIS — J45909 Unspecified asthma, uncomplicated: Secondary | ICD-10-CM

## 2011-09-14 DIAGNOSIS — R0683 Snoring: Secondary | ICD-10-CM

## 2011-09-14 HISTORY — DX: Obstructive sleep apnea (adult) (pediatric): G47.33

## 2011-09-14 MED ORDER — ALBUTEROL SULFATE HFA 108 (90 BASE) MCG/ACT IN AERS: 2.0000 | INHALATION_SPRAY | Freq: Four times a day (QID) | RESPIRATORY_TRACT | Status: DC | PRN

## 2011-09-14 NOTE — Assessment & Plan Note (Signed)
Likely OSA, refer for pulm eval.  OSA path/phys d/w pt. He agrees with referral.  Has good sleep hygiene.

## 2011-09-14 NOTE — Patient Instructions (Addendum)
If you are needing the inhaler more then a few times a week, then notify me.   I think you have sleep apnea.  See Shirlee Limerick about your referral before you leave today.

## 2011-09-14 NOTE — Progress Notes (Signed)
"  trouble sleeping."  More snoring noted.  Wife wakes him up to stop him.  Has been waking up gasping for air.  Dreams are altered.  Snoring is worst sleeping on L side.  Going on for months.  Tired, waking tired.  Has nodded of when sitting and still.  No trouble with sleep initiation.  Steady sleep schedule.  Very rare SABA use.  Occ with difficultly swallowing, "where it startles me."    Meds, vitals, and allergies reviewed.   ROS: See HPI.  Otherwise, noncontributory.  GEN: nad, alert and oriented HEENT: mucous membranes moist NECK: supple w/o LA CV: rrr.  Murmur noted PULM: ctab, no inc wob ABD: soft, +bs EXT: no edema SKIN: no acute rash

## 2011-10-19 ENCOUNTER — Encounter: Payer: Self-pay | Admitting: Pulmonary Disease

## 2011-10-19 ENCOUNTER — Ambulatory Visit (INDEPENDENT_AMBULATORY_CARE_PROVIDER_SITE_OTHER): Payer: BC Managed Care – PPO | Admitting: Pulmonary Disease

## 2011-10-19 VITALS — BP 124/74 | HR 80 | Temp 98.6°F | Ht 68.0 in | Wt 196.4 lb

## 2011-10-19 DIAGNOSIS — G479 Sleep disorder, unspecified: Secondary | ICD-10-CM

## 2011-10-19 DIAGNOSIS — R0609 Other forms of dyspnea: Secondary | ICD-10-CM

## 2011-10-19 DIAGNOSIS — R0989 Other specified symptoms and signs involving the circulatory and respiratory systems: Secondary | ICD-10-CM

## 2011-10-19 DIAGNOSIS — R0683 Snoring: Secondary | ICD-10-CM

## 2011-10-19 NOTE — Assessment & Plan Note (Signed)
He has snoring, witnessed apnea, sleep disruption, and daytime sleepiness.  He has history of hypertension and hypothyroidism.  He reports chronic sinus problems.  He reports intermittent episodes of bruxism.  I am concerned he could have sleep apnea.  I have explained how sleep apnea can affect the patient's health.  Driving precautions and importance of weight loss were discussed.  Treatment options for sleep apnea were reviewed.  To further assess, will arrange for in lab sleep study.

## 2011-10-19 NOTE — Patient Instructions (Signed)
Will schedule sleep study Will call to schedule follow up after sleep study reviewed 

## 2011-10-19 NOTE — Progress Notes (Deleted)
  Subjective:    Patient ID: [name], male    DOB: [birth year], 51 y.o.   MRN: [id number]  HPI    Review of Systems  Constitutional: Negative for fever, appetite change and unexpected weight change.  HENT: Positive for congestion, trouble swallowing and dental problem. Negative for sore throat, sneezing and sinus pressure.   Respiratory: Positive for cough and shortness of breath.   Cardiovascular: Negative for chest pain, palpitations and leg swelling.  Gastrointestinal: Negative for abdominal pain.  Musculoskeletal: Negative for joint swelling.  Skin: Negative for rash.  Neurological: Negative for headaches.  Psychiatric/Behavioral: Negative for dysphoric mood. The patient is not nervous/anxious.        Objective:   Physical Exam        Assessment & Plan:

## 2011-10-19 NOTE — Progress Notes (Signed)
Chief Complaint  Patient presents with  . Sleep Consult    c/o waking up and gasping for air, snores at night, feels tired during the day. last week pt dozed off while standing up at work    History of Present Illness: [name] is a 51 y.o. male for evaluation of sleep apnea.  He has trouble snoring.  His wife reports that he stops breathing while asleep.  He will wake up feeling like he has to gasp for air.  He does also have dreams in which he can't breath.  This has been getting worse.  He has trouble staying awake during the day.  He will fall asleep at work, and has fallen asleep while standing.  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  He goes to bed at 10 pm.  He falls asleep quickly.  He is not using anything to help him sleep at night.  He wakes up several times to use the bathroom.  He gets out of bed at 615 am.  He denies morning headaches.  He is not using to help him stay awake.  He has more trouble sleeping on his left side related to sinus congestion.  He does get trouble breathing through his nose.  He will occasional wake up feeling like he has clenched his teeth, and his wife says he grinds his teeth sometimes.  He will also get occasional leg cramps.  His Epworth score is 17 out of 24.  The patient denies sleep walking, sleep talking, or nightmares.  There is no history of restless legs.  The patient denies sleep hallucinations, sleep paralysis, or cataplexy.   Past Medical History  Diagnosis Date  . Allergy   . Hypertension   . Thyroid disease   . Heart murmur   . Fatty liver     with h/o elevated LFT's  . Bicuspid aortic valve     Past Surgical History  Procedure Date  . Cardiovascular stress test 03/07    Negative 06/05  . Doppler echocardiography 06/04  . Biopsy thyroid 08/19/07    Attempted, no tissue obtained  . Appendectomy 10/09    Emergency    Current Outpatient Prescriptions on File Prior to Visit  Medication Sig  Dispense Refill  . albuterol (PROVENTIL HFA;VENTOLIN HFA) 108 (90 BASE) MCG/ACT inhaler Inhale 2 puffs into the lungs every 6 (six) hours as needed.  18 g  12  . amLODipine-benazepril (LOTREL) 10-20 MG per capsule Take 1 capsule by mouth daily.  90 capsule  3  . levothyroxine (SYNTHROID, LEVOTHROID) 112 MCG tablet Take 1 tablet (112 mcg total) by mouth daily.  90 tablet  3  . DISCONTD: Azelastine HCl (ASTEPRO) 0.15 % SOLN Place 1-2 sprays into the nose daily.  30 mL  12  . DISCONTD: fluticasone (FLONASE) 50 MCG/ACT nasal spray Place 1-2 sprays into the nose daily.  16 g  12    No Known Allergies  Family History  Problem Relation Age of Onset  . Asthma Mother   . Cancer Father     Died when pt was 14 of CA with mets, site unknown, COPD  . COPD Father   . Heart disease Sister     Heart stopped  . Prostate cancer Maternal Uncle   . Colon cancer Maternal Grandmother   . Heart disease Maternal Grandfather     MI, 50 YOA    History  Substance Use Topics  . Smoking status: Never  Smoker   . Smokeless tobacco: Not on file  . Alcohol Use: Yes     rare    Review of Systems  Constitutional: Negative for fever, appetite change and unexpected weight change.  HENT: Positive for congestion, trouble swallowing and dental problem. Negative for sore throat, sneezing and sinus pressure.   Respiratory: Positive for cough and shortness of breath.   Cardiovascular: Negative for chest pain, palpitations and leg swelling.  Gastrointestinal: Negative for abdominal pain.  Musculoskeletal: Negative for joint swelling.  Skin: Negative for rash.  Neurological: Negative for headaches.  Psychiatric/Behavioral: Negative for dysphoric mood. The patient is not nervous/anxious.    Physical Exam: Filed Vitals:   10/19/11 1553 10/19/11 1556  BP:  124/74  Pulse:  80  Temp: 98.6 F (37 C)   TempSrc: Oral   Height: 5\' 8"  (1.727 m)   Weight: 196 lb 6.4 oz (89.086 kg)   SpO2:  97%  ,  Current Encounter  SPO2  10/19/11 1556 97%  09/14/11 1506 98%  02/20/11 1006 98%    Wt Readings from Last 3 Encounters:  10/19/11 196 lb 6.4 oz (89.086 kg)  09/14/11 196 lb (88.905 kg)  02/20/11 191 lb 12 oz (86.977 kg)    Body mass index is 29.86 kg/(m^2).   General - No distress ENT - TM clear, no sinus tenderness, no oral exudate, no LAN, no thyromegaly, MP 3, nasal septal deviation Cardiac - s1s2 regular, 2/6 SM, pulses symmetric, no edema Chest - normal respiratory excursion, good air entry, no wheeze/rales/dullness Back - no focal tenderness Abd - soft, non-tender, no organomegaly, + bowel sounds Ext - normal motor strength Neuro - Cranial nerves are normal. PERLA. EOM's intact. Skin - no discernible active dermatitis, erythema, urticaria or inflammatory process. Psych - normal mood, and behavior.   Lab Results  Component Value Date   WBC 17.6* 12/03/2007   HGB 17.5* 12/03/2007   HCT 51.1 12/03/2007   MCV 88.8 12/03/2007   PLT 340 12/03/2007    Lab Results  Component Value Date   CREATININE 1.1 02/13/2011   BUN 15 02/13/2011   NA 139 02/13/2011   K 4.6 02/13/2011   CL 103 02/13/2011   CO2 27 02/13/2011    Lab Results  Component Value Date   ALT 110* 02/13/2011   AST 72* 02/13/2011   ALKPHOS 113 02/13/2011   BILITOT 1.0 02/13/2011    Lab Results  Component Value Date   TSH 0.38 02/13/2011   Assessment/Plan:  Coralyn Helling, MD [facility]/Sleep Pager:  [phone] 10/19/2011, 3:58 PM

## 2011-11-02 ENCOUNTER — Other Ambulatory Visit: Payer: Self-pay | Admitting: Family Medicine

## 2011-11-05 ENCOUNTER — Encounter: Payer: Self-pay | Admitting: Internal Medicine

## 2011-11-05 ENCOUNTER — Ambulatory Visit (INDEPENDENT_AMBULATORY_CARE_PROVIDER_SITE_OTHER): Payer: BC Managed Care – PPO | Admitting: Internal Medicine

## 2011-11-05 VITALS — BP 132/81 | HR 72 | Ht 68.0 in | Wt 194.0 lb

## 2011-11-05 DIAGNOSIS — Q231 Congenital insufficiency of aortic valve: Secondary | ICD-10-CM

## 2011-11-05 DIAGNOSIS — Q2381 Bicuspid aortic valve: Secondary | ICD-10-CM

## 2011-11-05 NOTE — Progress Notes (Signed)
HPI patient is a 51 year old who was referred for evaluation of AV. He has been seen in cardiology in the past. By his report he has a bicuspid valve.  He had a TEE and TTE at that time.  He has not been seen in cardiology since Echo showed bicuspid AV  Mean gradient across the valve was 11 mm Hg  There was mild AI. No Known Allergies  Current Outpatient Prescriptions  Medication Sig Dispense Refill  . albuterol (PROVENTIL HFA;VENTOLIN HFA) 108 (90 BASE) MCG/ACT inhaler Inhale 2 puffs into the lungs every 6 (six) hours as needed.  18 g  12  . amLODipine-benazepril (LOTREL) 10-20 MG per capsule Take 1 capsule by mouth daily.  90 capsule  3  . ASTEPRO 0.15 % SOLN TAKE 2 SPRAYS IN EACH NOSTRIL TWO TIMES A DAY FOR ALLERGY  30 mL  2  . Azelastine HCl 0.15 % SOLN Place 1-2 sprays into the nose daily as needed.      . fluticasone (FLONASE) 50 MCG/ACT nasal spray Place 1-2 sprays into the nose daily as needed.      . fluticasone (FLONASE) 50 MCG/ACT nasal spray USE 2 SPRAYS IN EACH NOSTRIL AS NEEDED NASAL CONGESTION  16 g  3  . levothyroxine (SYNTHROID, LEVOTHROID) 112 MCG tablet Take 1 tablet (112 mcg total) by mouth daily.  90 tablet  3    Past Medical History  Diagnosis Date  . Allergy   . Hypertension   . Thyroid disease   . Heart murmur   . Fatty liver     with h/o elevated LFT's  . Bicuspid aortic valve     Past Surgical History  Procedure Date  . Cardiovascular stress test 03/07    Negative 06/05  . Doppler echocardiography 06/04  . Biopsy thyroid 08/19/07    Attempted, no tissue obtained  . Appendectomy 10/09    Emergency    Family History  Problem Relation Age of Onset  . Asthma Mother   . Cancer Father     Died when pt was 14 of CA with mets, site unknown, COPD  . COPD Father   . Heart disease Sister     Heart stopped  . Prostate cancer Maternal Uncle   . Colon cancer Maternal Grandmother   . Heart disease Maternal Grandfather     MI, 59 YOA    History   Social  History  . Marital Status: Married    Spouse Name: N/A    Number of Children: 1  . Years of Education: N/A   Occupational History  . [employer]     Manages "front" and writes orders [place]   Social History Main Topics  . Smoking status: Never Smoker   . Smokeless tobacco: Not on file  . Alcohol Use: Yes     rare  . Drug Use: Not on file  . Sexually Active: Not on file   Other Topics Concern  . Not on file   Social History Narrative   Married, [phone] child and 2 stepchildrenOccupation:  working [employer] "front" and writes orders--in [place]    Review of Systems:  All systems reviewed.  They are negative to the above problem except as previously stated.  Vital Signs: BP 132/81  P 72  Wt 194  Physical Exam Patient is in NAD HEENT:  Normocephalic, atraumatic. EOMI, PERRLA.  Neck: JVP is normal.  No bruits.  Lungs: clear to auscultation. No rales no wheezes.  Heart: Regular  rate and rhythm. Normal S1, S2. No S3.  GR II/VI systolic murmur at base.  No diastolc murmurs.[name] PMI not displaced.  Abdomen:  Supple, nontender. Normal bowel sounds. No masses. No hepatomegaly.  Extremities:   Good distal pulses throughout. No lower extremity edema.  Musculoskeletal :moving all extremities.  Neuro:   alert and oriented x3.  CN II-XII grossly intact.  EKG  SR  72  Bpm Assessment and Plan:  1.  Bicuspid AV  Murmur is relatively unchanged  AS appears to be mild.  No regurgitant murmur heard Will plan f/u in 2 years  2.  Reflux  Patient reports increased  Recom trial of pepcid  3.  HL  LDL was 113 in Dec  HDL was 48  Discussed diet and exercise  4. ? Sleep apnea  Patient has been seen by Clayton Lefort.  Sched for sleep study.

## 2011-11-05 NOTE — Patient Instructions (Signed)
Your physician wants you to follow-up in:  2 years. You will receive a reminder letter in the mail two months in advance. If you don't receive a letter, please call our office to schedule the follow-up appointment.   

## 2011-11-08 ENCOUNTER — Ambulatory Visit (HOSPITAL_BASED_OUTPATIENT_CLINIC_OR_DEPARTMENT_OTHER): Payer: BC Managed Care – PPO | Attending: Pulmonary Disease

## 2011-11-08 VITALS — Ht 68.0 in | Wt 194.0 lb

## 2011-11-08 DIAGNOSIS — G4733 Obstructive sleep apnea (adult) (pediatric): Secondary | ICD-10-CM | POA: Insufficient documentation

## 2011-11-08 DIAGNOSIS — R0683 Snoring: Secondary | ICD-10-CM

## 2011-11-11 DIAGNOSIS — G4733 Obstructive sleep apnea (adult) (pediatric): Secondary | ICD-10-CM

## 2011-11-11 NOTE — Procedures (Signed)
[name], [name]                ACCOUNT NO.:  [id number]  MEDICAL RECORD NO.:  [id number]          PATIENT TYPE:  OUT  LOCATION:  SLEEP CENTER                 FACILITY[facility]  [facility]  PHYSICIAN:  Coralyn Helling, MD        DATE OF BIRTH:  10-Nov-1960  DATE OF STUDY:  11/08/2011                           NOCTURNAL POLYSOMNOGRAM  REFERRING PHYSICIAN:  Coralyn Helling, MD  INDICATION FOR STUDY:  [name] is a 51 year old male, who has a history of hypertension and bruxism.  He also has sleep disruption, snoring, and daytime sleepiness.  He is referred to the sleep lab for evaluation of hypersomnia and obstructive sleep apnea.  Height is 5 feet 8 inches, weight is 194 pounds.  BMI is 29, neck size is 15 inches.  EPWORTH SLEEPINESS SCORE:  14.  MEDICATIONS:  Albuterol, amlodipine, fluticasone, and levothyroxine.  SLEEP ARCHITECTURE:  Total recording time was 396 minutes.  Total sleep time was 250 minutes.  Sleep efficiency was 55%, sleep latency was 36.5 minutes.  REM latency was 125 minutes.  The patient was observed in all stages of sleep and he slept in both the supine and nonsupine positions.  RESPIRATORY DATA:  The average respiratory rate was 17.  Loud snoring was noted by the technician.  The overall apnea-hypopnea index was 31.6. There were 2 central apneic events.  The remainder of the events were obstructive in nature.  OXYGEN DATA:  The baseline oxygenation was 96%.  The oxygen saturation nadir was 85%.  The patient spent a total of 2.1 minute with an oxygen saturation below 88%.  The study was conducted without the use of supplemental oxygen.  CARDIAC DATA:  The average heart rate was 68 and the rhythm strip showed sinus rhythm with occasional PVCs.  MOVEMENT-PARASOMNIA:  The patient had 3 restroom trip.  The periodic limb movement index was 0.  IMPRESSIONS-RECOMMENDATIONS:  This study shows evidence for severe obstructive sleep apnea with an apnea-hypopnea index of 31.6.   The oxygen saturation nadir of 85%.  In addition to diet, exercise, and weight reduction, additional therapeutic interventions could include CPAP therapy, oral appliance, or surgical intervention.     Coralyn Helling, MD Diplomat, American Board of Sleep Medicine    VS/MEDQ  D:  11/11/2011 07:50:24  T:  11/11/2011 23:43:14  Job:  [id number]

## 2011-11-12 ENCOUNTER — Telehealth: Payer: Self-pay | Admitting: Pulmonary Disease

## 2011-11-12 ENCOUNTER — Encounter: Payer: Self-pay | Admitting: Pulmonary Disease

## 2011-11-12 DIAGNOSIS — G479 Sleep disorder, unspecified: Secondary | ICD-10-CM

## 2011-11-12 NOTE — Telephone Encounter (Signed)
PSG 11/08/11>>AHI 31.6, SpO2 low 85%.  Will have my nurse schedule ROV to review results.

## 2011-11-13 NOTE — Telephone Encounter (Signed)
Pt is scheduled to come in 11/18/11 at 4:15

## 2011-11-18 ENCOUNTER — Ambulatory Visit (INDEPENDENT_AMBULATORY_CARE_PROVIDER_SITE_OTHER): Payer: BC Managed Care – PPO | Admitting: Pulmonary Disease

## 2011-11-18 ENCOUNTER — Encounter: Payer: Self-pay | Admitting: Pulmonary Disease

## 2011-11-18 VITALS — BP 118/76 | HR 74 | Temp 97.7°F | Ht 68.0 in | Wt 197.0 lb

## 2011-11-18 DIAGNOSIS — G4733 Obstructive sleep apnea (adult) (pediatric): Secondary | ICD-10-CM

## 2011-11-18 DIAGNOSIS — Z23 Encounter for immunization: Secondary | ICD-10-CM

## 2011-11-18 NOTE — Patient Instructions (Signed)
Will arrange for CPAP set up Flu shot today Follow up in 8 weeks

## 2011-11-18 NOTE — Progress Notes (Signed)
Chief Complaint  Patient presents with  . Follow-up    discuss sleep study. would like flu vaccine    History of Present Illness: [name] is a 51 y.o. male with severe OSA.  He is here to review his sleep study.  Tests: PSG 11/08/11>>AHI 31.6, SpO2 low 85%.  Past Medical History  Diagnosis Date  . Allergy   . Hypertension   . Thyroid disease   . Heart murmur   . Fatty liver     with h/o elevated LFT's  . Bicuspid aortic valve   . OSA (obstructive sleep apnea) 09/14/2011    PSG 11/08/11>>AHI 31.6, SpO2 low 85%.     Past Surgical History  Procedure Date  . Cardiovascular stress test 03/07    Negative 06/05  . Doppler echocardiography 06/04  . Biopsy thyroid 08/19/07    Attempted, no tissue obtained  . Appendectomy 10/09    Emergency    Outpatient Encounter Prescriptions as of 11/18/2011  Medication Sig Dispense Refill  . albuterol (PROVENTIL HFA;VENTOLIN HFA) 108 (90 BASE) MCG/ACT inhaler Inhale 2 puffs into the lungs every 6 (six) hours as needed.  18 g  12  . amLODipine-benazepril (LOTREL) 10-20 MG per capsule Take 1 capsule by mouth daily.  90 capsule  3  . ASTEPRO 0.15 % SOLN TAKE 2 SPRAYS IN EACH NOSTRIL TWO TIMES A DAY FOR ALLERGY  30 mL  2  . fluticasone (FLONASE) 50 MCG/ACT nasal spray Place 1-2 sprays into the nose daily as needed.      [name] levothyroxine (SYNTHROID, LEVOTHROID) 112 MCG tablet Take 1 tablet (112 mcg total) by mouth daily.  90 tablet  3  . omeprazole (PRILOSEC) 20 MG capsule Take 20 mg by mouth daily.        No Known Allergies  Physical Exam:  Filed Vitals:   11/18/11 1611 11/18/11 1613  BP:  118/76  Pulse:  74  Temp: 97.7 F (36.5 C)   TempSrc: Oral   Height: 5\' 8"  (1.727 m)   Weight: 197 lb (89.359 kg)   SpO2:  97%    Current Encounter SPO2  11/18/11 1613 97%  10/19/11 1556 97%  09/14/11 1506 98%    Body mass index is 29.95 kg/(m^2). Wt Readings from Last 2 Encounters:  11/18/11 197 lb (89.359 kg)  11/08/11 194 lb  (87.998 kg)    General - No distress  ENT - TM clear, no sinus tenderness, no oral exudate, no LAN, no thyromegaly, MP 3, nasal septal deviation  Cardiac - s1s2 regular, 2/6 SM, pulses symmetric, no edema  Chest - normal respiratory excursion, good air entry, no wheeze/rales/dullness  Back - no focal tenderness  Abd - soft, non-tender, no organomegaly, + bowel sounds  Ext - normal motor strength  Neuro - Cranial nerves are normal. PERLA. EOM's intact.  Skin - no discernible active dermatitis, erythema, urticaria or inflammatory process.  Psych - normal mood, and behavior.  Assessment/Plan:  Coralyn Helling, MD [place]Critical [facility]/Sleep Pager:  [phone] 11/18/2011, 4:19 PM

## 2011-11-18 NOTE — Assessment & Plan Note (Signed)
He has severe sleep apnea.  I have reviewed his sleep test results with the patient.  Explained how sleep apnea can affect the patient's health.  Driving precautions and importance of weight loss were discussed.  Treatment options for sleep apnea were reviewed.  Will arrange for auto CPAP set up. 

## 2012-01-05 ENCOUNTER — Telehealth: Payer: Self-pay | Admitting: Pulmonary Disease

## 2012-01-05 NOTE — Telephone Encounter (Signed)
Auto CPAP 11/29/11 to 12/28/11>>Used on 29 of 30 nights with average 7 hrs 46 min.  Average AHI 1.3 with median CPAP 8 cm H2O and 95th percentile CPAP 12 cm H2O.  Will have my nurse inform patient that CPAP report looks very good. No change to current set up.  Will discuss in more detail at next ROV.

## 2012-01-06 NOTE — Telephone Encounter (Signed)
I spoke with patient about results and he verbalized understanding and had no questions 

## 2012-01-13 ENCOUNTER — Ambulatory Visit (INDEPENDENT_AMBULATORY_CARE_PROVIDER_SITE_OTHER): Payer: BC Managed Care – PPO | Admitting: Pulmonary Disease

## 2012-01-13 ENCOUNTER — Encounter: Payer: Self-pay | Admitting: Pulmonary Disease

## 2012-01-13 VITALS — BP 118/82 | HR 64 | Temp 97.3°F | Ht 68.0 in | Wt 202.8 lb

## 2012-01-13 DIAGNOSIS — J45909 Unspecified asthma, uncomplicated: Secondary | ICD-10-CM

## 2012-01-13 DIAGNOSIS — G4733 Obstructive sleep apnea (adult) (pediatric): Secondary | ICD-10-CM

## 2012-01-13 NOTE — Assessment & Plan Note (Signed)
He has noticed more trouble with his asthma with more frequent albuterol use.  Advised him to d/w Dr. Para March about whether he should start maintenance inhaler therapy for his asthma.  He may also need pulmonary function testing and chest xray to further assess.  Will defer this to primary care, but would be happy to assist if felt pulmonary assistance was needed.

## 2012-01-13 NOTE — Assessment & Plan Note (Signed)
He has done very well with CPAP, and is compliant with therapy.

## 2012-01-13 NOTE — Patient Instructions (Signed)
Follow-up in one year.

## 2012-01-13 NOTE — Progress Notes (Signed)
Chief Complaint  Patient presents with  . Follow-up    wears cpap everynight x 7 hrs a night. no problems w/ mask.machine. still adjusting to cpap. feeling rested. occaisonally will feel tired.     History of Present Illness: [name] is a 51 y.o. male with severe OSA.  He has a full face mask.  He has been using his CPAP every night.  He is sleeping much better, and feels more rested.  He gets occasional mouth dryness, but does not need to breath through his mouth as much now.  He drools sometimes at night.  He has noticed more trouble with his asthma.  He has more frequent cough.  He is having to use his albuterol every day.   Tests: PSG 11/08/11>>AHI 31.6, SpO2 low 85%. Auto CPAP 11/29/11 to 12/28/11>>Used on 29 of 30 nights with average 7 hrs 46 min. Average AHI 1.3 with median CPAP 8 cm H2O and 95th percentile CPAP 12 cm H2O.  Past Medical History  Diagnosis Date  . Allergy   . Hypertension   . Thyroid disease   . Heart murmur   . Fatty liver     with h/o elevated LFT's  . Bicuspid aortic valve   . OSA (obstructive sleep apnea) 09/14/2011    PSG 11/08/11>>AHI 31.6, SpO2 low 85%.     Past Surgical History  Procedure Date  . Cardiovascular stress test 03/07    Negative 06/05  . Doppler echocardiography 06/04  . Biopsy thyroid 08/19/07    Attempted, no tissue obtained  . Appendectomy 10/09    Emergency    Outpatient Encounter Prescriptions as of 01/13/2012  Medication Sig Dispense Refill  . albuterol (PROVENTIL HFA;VENTOLIN HFA) 108 (90 BASE) MCG/ACT inhaler Inhale 2 puffs into the lungs every 6 (six) hours as needed.  18 g  12  . amLODipine-benazepril (LOTREL) 10-20 MG per capsule Take 1 capsule by mouth daily.  90 capsule  3  . ASTEPRO 0.15 % SOLN TAKE 2 SPRAYS IN EACH NOSTRIL TWO TIMES A DAY FOR ALLERGY  30 mL  2  . fluticasone (FLONASE) 50 MCG/ACT nasal spray Place 1-2 sprays into the nose daily as needed.      [name] levothyroxine (SYNTHROID, LEVOTHROID) 112 MCG  tablet Take 1 tablet (112 mcg total) by mouth daily.  90 tablet  3  . omeprazole (PRILOSEC) 20 MG capsule Take 20 mg by mouth daily.        No Known Allergies  Physical Exam:  Filed Vitals:   01/13/12 1629 01/13/12 1632  BP:  118/82  Pulse:  64  Temp: 97.3 F (36.3 C)   TempSrc: Oral   Height: 5\' 8"  (1.727 m)   Weight: 202 lb 12.8 oz (91.989 kg)   SpO2:  98%    Current Encounter SPO2  01/13/12 1632 98%  11/18/11 1613 97%  10/19/11 1556 97%    Body mass index is 30.84 kg/(m^2). Wt Readings from Last 2 Encounters:  01/13/12 202 lb 12.8 oz (91.989 kg)  11/18/11 197 lb (89.359 kg)    General - No distress  ENT - No sinus tenderness, no oral exudate, no LAN, no thyromegaly, MP 3, nasal septal deviation  Cardiac - s1s2 regular, 2/6 SM, pulses symmetric, no edema  Chest - normal respiratory excursion, good air entry, no wheeze/rales/dullness  Back - no focal tenderness  Abd - soft, non-tender  Ext - no edema Neuro - normal strength Skin - no rash Psych - normal mood, and behavior.  Assessment/Plan:  Coralyn Helling, MD [place] [facility]/Critical Care/Sleep Pager:  [phone] 01/13/2012, 4:43 PM

## 2012-01-25 ENCOUNTER — Telehealth: Payer: Self-pay

## 2012-01-25 MED ORDER — FLUTICASONE-SALMETEROL 250-50 MCG/DOSE IN AEPB: 1.0000 | INHALATION_SPRAY | Freq: Two times a day (BID) | RESPIRATORY_TRACT | Status: DC

## 2012-01-25 NOTE — Telephone Encounter (Signed)
Patient advised.  F/U appt scheduled.  Medication sent to pharmacy.

## 2012-01-25 NOTE — Telephone Encounter (Signed)
Pt left v/m; does not think inhaler lasting as long. Dr Para March stopped longer acting inhaler and if pt had problems to call back.Pt presently using Albuterol inhaler about q4h instead of q6h for difficulty breathing. Pt saw Pulmonologist for sleep apnea but advised pt to contact PCP about inhaler. [facility].

## 2012-01-25 NOTE — Telephone Encounter (Signed)
I would start back on advair 250/50, 1 puff bid.  Use the albuterol prn with that.  Advair would likely be safer in the long run instead of plain serevent.  Needs to rinse after using advair each time.  Would like to see pt for recheck and peak flow after he's been on advair about 2 weeks.    Please send rx for advair after Erxing is up.  Thanks.

## 2012-02-04 ENCOUNTER — Telehealth: Payer: Self-pay | Admitting: Pulmonary Disease

## 2012-02-04 NOTE — Telephone Encounter (Signed)
CPAP 01/03/12 to 02/01/12 >> Used on 30 of 30 nights with average 8 hrs 14 min.  Average AHI 1.7 with median CPAP 9 cm H2O and 95th percentile CPAP 13 cm H2O.  Will have my nurse inform pt that CPAP report looks great.  Excellent control of sleep apnea with current set up.

## 2012-02-04 NOTE — Telephone Encounter (Signed)
I spoke with patient about results and he verbalized understanding and had no questions 

## 2012-02-09 ENCOUNTER — Ambulatory Visit (INDEPENDENT_AMBULATORY_CARE_PROVIDER_SITE_OTHER): Payer: BC Managed Care – PPO | Admitting: Family Medicine

## 2012-02-09 ENCOUNTER — Encounter: Payer: Self-pay | Admitting: Family Medicine

## 2012-02-09 VITALS — BP 126/84 | HR 72 | Temp 98.2°F | Wt 202.8 lb

## 2012-02-09 DIAGNOSIS — J45909 Unspecified asthma, uncomplicated: Secondary | ICD-10-CM

## 2012-02-09 NOTE — Progress Notes (Signed)
Asthma.  Back on advair and now needing SABA much less often, used only once in the last 2 weeks.  His peak flow was 600 today.  Occ cough, likely from postnasal gtt. No wheeze.  Not SOB except for deconditioning.  He does the best during the summer.    Has seen pulm re: OSA.  He is much improved and the mask has a good seal.    Meds, vitals, and allergies reviewed.   ROS: See HPI.  Otherwise, noncontributory.  GEN: nad, alert and oriented HEENT: mucous membranes moist NECK: supple w/o LA CV: rrr PULM: ctab, no inc wob EXT: no edema SKIN: no acute rash

## 2012-02-09 NOTE — Patient Instructions (Addendum)
Keep using the advair twice a day and rinse after use.  You can try to taper off it in late spring. If you need the albuterol more, then start back on the advair or increase back to twice a day.  Take care.   Schedule a physical for summer 2014.

## 2012-02-09 NOTE — Assessment & Plan Note (Signed)
Much improved.  D/w pt about path/phys of asthma and maintenance vs abortive tx.  He agrees.  Routine cautions/instructions given on meds.  Doing well.  Flu shot prev done.  He may be able to taper off advair in the summer.  See instructions.

## 2012-02-23 ENCOUNTER — Other Ambulatory Visit: Payer: Self-pay | Admitting: Family Medicine

## 2012-07-27 ENCOUNTER — Other Ambulatory Visit: Payer: Self-pay | Admitting: Family Medicine

## 2012-07-27 DIAGNOSIS — I10 Essential (primary) hypertension: Secondary | ICD-10-CM

## 2012-07-27 DIAGNOSIS — Z125 Encounter for screening for malignant neoplasm of prostate: Secondary | ICD-10-CM

## 2012-08-02 ENCOUNTER — Other Ambulatory Visit (INDEPENDENT_AMBULATORY_CARE_PROVIDER_SITE_OTHER): Payer: BC Managed Care – PPO

## 2012-08-02 DIAGNOSIS — Z8042 Family history of malignant neoplasm of prostate: Secondary | ICD-10-CM

## 2012-08-02 DIAGNOSIS — Z125 Encounter for screening for malignant neoplasm of prostate: Secondary | ICD-10-CM

## 2012-08-02 DIAGNOSIS — Z0000 Encounter for general adult medical examination without abnormal findings: Secondary | ICD-10-CM

## 2012-08-02 DIAGNOSIS — E7800 Pure hypercholesterolemia, unspecified: Secondary | ICD-10-CM

## 2012-08-02 DIAGNOSIS — E039 Hypothyroidism, unspecified: Secondary | ICD-10-CM

## 2012-08-02 DIAGNOSIS — I10 Essential (primary) hypertension: Secondary | ICD-10-CM

## 2012-08-02 LAB — COMPREHENSIVE METABOLIC PANEL
ALT: 79 U/L — ABNORMAL HIGH (ref 0–53)
AST: 81 U/L — ABNORMAL HIGH (ref 0–37)
Albumin: 3.8 g/dL (ref 3.5–5.2)
Alkaline Phosphatase: 191 U/L — ABNORMAL HIGH (ref 39–117)
BUN: 13 mg/dL (ref 6–23)
CO2: 28 mEq/L (ref 19–32)
Calcium: 9.2 mg/dL (ref 8.4–10.5)
Chloride: 104 mEq/L (ref 96–112)
Creatinine, Ser: 1 mg/dL (ref 0.4–1.5)
GFR: 88.06 mL/min (ref >60.00)
Glucose, Bld: 123 mg/dL — ABNORMAL HIGH (ref 70–99)
Potassium: 4.2 mEq/L (ref 3.5–5.1)
Sodium: 137 mEq/L (ref 135–145)
Total Bilirubin: 1.5 mg/dL — ABNORMAL HIGH (ref 0.3–1.2)
Total Protein: 7.8 g/dL (ref 6.0–8.3)

## 2012-08-02 LAB — LIPID PANEL
Cholesterol: 272 mg/dL — ABNORMAL HIGH (ref 0–200)
HDL: 30.2 mg/dL — ABNORMAL LOW (ref >39.00)
Total CHOL/HDL Ratio: 9
Triglycerides: 234 mg/dL — ABNORMAL HIGH (ref 0.0–149.0)
VLDL: 46.8 mg/dL — ABNORMAL HIGH (ref 0.0–40.0)

## 2012-08-02 LAB — PSA: PSA: 0.38 ng/mL (ref 0.10–4.00)

## 2012-08-02 LAB — LDL CHOLESTEROL, DIRECT: Direct LDL: 196 mg/dL

## 2012-08-02 LAB — TSH: TSH: 10.81 u[IU]/mL — ABNORMAL HIGH (ref 0.35–5.50)

## 2012-08-09 ENCOUNTER — Encounter: Payer: BC Managed Care – PPO | Admitting: Family Medicine

## 2012-08-12 ENCOUNTER — Ambulatory Visit (INDEPENDENT_AMBULATORY_CARE_PROVIDER_SITE_OTHER): Payer: BC Managed Care – PPO | Admitting: Family Medicine

## 2012-08-12 ENCOUNTER — Encounter: Payer: Self-pay | Admitting: Family Medicine

## 2012-08-12 ENCOUNTER — Encounter: Payer: Self-pay | Admitting: Gastroenterology

## 2012-08-12 VITALS — BP 126/86 | HR 72 | Temp 98.0°F | Ht 68.0 in | Wt 206.8 lb

## 2012-08-12 DIAGNOSIS — J45909 Unspecified asthma, uncomplicated: Secondary | ICD-10-CM

## 2012-08-12 DIAGNOSIS — E119 Type 2 diabetes mellitus without complications: Secondary | ICD-10-CM

## 2012-08-12 DIAGNOSIS — R739 Hyperglycemia, unspecified: Secondary | ICD-10-CM

## 2012-08-12 DIAGNOSIS — Z8042 Family history of malignant neoplasm of prostate: Secondary | ICD-10-CM

## 2012-08-12 DIAGNOSIS — R7989 Other specified abnormal findings of blood chemistry: Secondary | ICD-10-CM

## 2012-08-12 DIAGNOSIS — R7309 Other abnormal glucose: Secondary | ICD-10-CM

## 2012-08-12 DIAGNOSIS — I10 Essential (primary) hypertension: Secondary | ICD-10-CM

## 2012-08-12 DIAGNOSIS — Z1211 Encounter for screening for malignant neoplasm of colon: Secondary | ICD-10-CM

## 2012-08-12 DIAGNOSIS — R7401 Elevation of levels of liver transaminase levels: Secondary | ICD-10-CM

## 2012-08-12 DIAGNOSIS — E039 Hypothyroidism, unspecified: Secondary | ICD-10-CM

## 2012-08-12 DIAGNOSIS — Z0000 Encounter for general adult medical examination without abnormal findings: Secondary | ICD-10-CM

## 2012-08-12 DIAGNOSIS — K429 Umbilical hernia without obstruction or gangrene: Secondary | ICD-10-CM

## 2012-08-12 DIAGNOSIS — J4540 Moderate persistent asthma, uncomplicated: Secondary | ICD-10-CM

## 2012-08-12 LAB — HEMOGLOBIN A1C: Hgb A1c MFr Bld: 6.8 % — ABNORMAL HIGH (ref 4.6–6.5)

## 2012-08-12 MED ORDER — LEVOTHYROXINE SODIUM 125 MCG PO TABS: 125.0000 ug | ORAL_TABLET | Freq: Every day | ORAL | Status: DC

## 2012-08-12 MED ORDER — AZELASTINE HCL 0.15 % NA SOLN: NASAL | Status: DC

## 2012-08-12 MED ORDER — ALBUTEROL SULFATE HFA 108 (90 BASE) MCG/ACT IN AERS: 2.0000 | INHALATION_SPRAY | Freq: Four times a day (QID) | RESPIRATORY_TRACT | Status: DC | PRN

## 2012-08-12 MED ORDER — FLUTICASONE-SALMETEROL 250-50 MCG/DOSE IN AEPB: 1.0000 | INHALATION_SPRAY | Freq: Two times a day (BID) | RESPIRATORY_TRACT | Status: DC

## 2012-08-12 MED ORDER — FLUTICASONE PROPIONATE 50 MCG/ACT NA SUSP: 1.0000 | Freq: Every day | NASAL | Status: DC | PRN

## 2012-08-12 MED ORDER — AMLODIPINE BESY-BENAZEPRIL HCL 10-20 MG PO CAPS: ORAL_CAPSULE | ORAL | Status: DC

## 2012-08-12 NOTE — Progress Notes (Signed)
CPE- See plan.  Routine anticipatory guidance given to patient.  See health maintenance. Tetanus 2010 Flu shot 2013 PSA wnl.  D/w patient OZ:HYQMVHQ for colon cancer screening, including IFOB vs. colonoscopy.  Risks and benefits of both were discussed and patient voiced understanding.  Pt elects for: colonoscopy.   Living will d/w pt. Wife and his daughter would be designated equally.   Diet and exercise d/w pt.  "Terrible" on both.    Asthma. Rare SABA use.  Using advair less this time of year.  No SOB.  No wheeze.    Hypothyroid.  TSH up.  D/w pt.  Complaint with meds. No dysphagia except for a dry pill. Usually not having trouble with food.    Fatty liver known.  Transaminitis d/w pt.  Prev imaged, but not recently.  No vomiting blood, no blood in stool, no jaundice.  GERD is improved with PPI.  Abd protrusion at umbilicus recently noted.    Elevated sugar and lipids noted. D/w pt. Exercise is limited.  Drinking nondiet soda.   OSA improved, controlled on CPAP.  Complaint.    PMH and SH reviewed  Meds, vitals, and allergies reviewed.   ROS: See HPI.  Otherwise negative.    GEN: nad, alert and oriented HEENT: mucous membranes moist NECK: supple w/o LA CV: rrr. PULM: ctab, no inc wob ABD: soft, +bs, soft small umbilical hernia noted with diastasis noted also EXT: no edema SKIN: no acute rash

## 2012-08-12 NOTE — Patient Instructions (Addendum)
Go to the lab on the way out.  We'll contact you with your lab report. See Shirlee Limerick about your referral before you leave today (GI and ultrasound).  Look at [email or web address].[email or web address] and start reading about diet and exercise.   Increase your thyroid medicine and recheck TSH in about 8 weeks.  We'll set follow up when I see your A1c (the extra sugar test from today).

## 2012-08-14 DIAGNOSIS — K429 Umbilical hernia without obstruction or gangrene: Secondary | ICD-10-CM | POA: Insufficient documentation

## 2012-08-14 DIAGNOSIS — R7989 Other specified abnormal findings of blood chemistry: Secondary | ICD-10-CM | POA: Insufficient documentation

## 2012-08-14 NOTE — Assessment & Plan Note (Signed)
New dx by A1c, see notes on labs.  D/w pt about Dm2 path/phys.

## 2012-08-14 NOTE — Assessment & Plan Note (Signed)
Soft, anatomy d/w pt.  Would address other issues first.  He agrees.  Notify us if enlarging.

## 2012-08-14 NOTE — Assessment & Plan Note (Signed)
Controlled, needs to lose weight.

## 2012-08-14 NOTE — Assessment & Plan Note (Signed)
Controlled, continue current meds.   

## 2012-08-14 NOTE — Assessment & Plan Note (Signed)
Routine anticipatory guidance given to patient.  See health maintenance. Tetanus 2010 Flu shot 2013 PSA wnl.  D/w patient [id number] for colon cancer screening, including IFOB vs. colonoscopy.  Risks and benefits of both were discussed and patient voiced understanding.  Pt elects for: colonoscopy.   Living will d/w pt. Wife and his daughter would be designated equally.   Diet and exercise d/w pt.  "Terrible" on both.

## 2012-08-14 NOTE — Assessment & Plan Note (Signed)
psa wnl.  

## 2012-08-14 NOTE — Assessment & Plan Note (Signed)
No tmg on exam, inc replacement and recheck in ~2 months.

## 2012-08-14 NOTE — Assessment & Plan Note (Signed)
Likely fatty liver, recheck u/s.  Need to lose weight.

## 2012-08-16 ENCOUNTER — Ambulatory Visit
Admission: RE | Admit: 2012-08-16 | Discharge: 2012-08-16 | Disposition: A | Discharge status: Home / Self Care | Payer: BC Managed Care – PPO | Source: Ambulatory Visit | Attending: Family Medicine | Admitting: Family Medicine

## 2012-08-16 DIAGNOSIS — R7401 Elevation of levels of liver transaminase levels: Secondary | ICD-10-CM

## 2012-08-29 ENCOUNTER — Telehealth: Payer: Self-pay

## 2012-08-29 NOTE — Telephone Encounter (Signed)
Pt said someone from one of the [place] locations (pt not sure which [place] office called) left v/m to cb; Dr Lianne Bushy CMA not trying to reach pt. Pt will ck with other offices.

## 2012-10-03 ENCOUNTER — Encounter: Payer: Self-pay | Admitting: Gastroenterology

## 2012-10-03 ENCOUNTER — Ambulatory Visit (AMBULATORY_SURGERY_CENTER): Payer: BC Managed Care – PPO

## 2012-10-03 VITALS — Ht 68.0 in | Wt 188.8 lb

## 2012-10-03 DIAGNOSIS — Z1211 Encounter for screening for malignant neoplasm of colon: Secondary | ICD-10-CM

## 2012-10-03 MED ORDER — MOVIPREP 100 G PO SOLR: 1.0000 | Freq: Once | ORAL | Status: DC

## 2012-10-07 ENCOUNTER — Other Ambulatory Visit (INDEPENDENT_AMBULATORY_CARE_PROVIDER_SITE_OTHER): Payer: BC Managed Care – PPO

## 2012-10-07 DIAGNOSIS — E119 Type 2 diabetes mellitus without complications: Secondary | ICD-10-CM

## 2012-10-07 DIAGNOSIS — E039 Hypothyroidism, unspecified: Secondary | ICD-10-CM

## 2012-10-07 LAB — HEMOGLOBIN A1C: Hgb A1c MFr Bld: 5.6 % (ref 4.6–6.5)

## 2012-10-07 LAB — TSH: TSH: 1.17 u[IU]/mL (ref 0.35–5.50)

## 2012-10-11 ENCOUNTER — Ambulatory Visit (AMBULATORY_SURGERY_CENTER): Payer: BC Managed Care – PPO | Admitting: Gastroenterology

## 2012-10-11 ENCOUNTER — Encounter: Payer: Self-pay | Admitting: Gastroenterology

## 2012-10-11 VITALS — BP 116/77 | HR 71 | Temp 97.7°F | Resp 19 | Ht 68.0 in | Wt 188.0 lb

## 2012-10-11 DIAGNOSIS — Z1211 Encounter for screening for malignant neoplasm of colon: Secondary | ICD-10-CM

## 2012-10-11 MED ORDER — SODIUM CHLORIDE 0.9 % IV SOLN: 500.0000 mL | INTRAVENOUS | Status: DC

## 2012-10-11 NOTE — Progress Notes (Signed)
Abdominal pressure by tech to aid the scope advancement. Maw

## 2012-10-11 NOTE — Patient Instructions (Addendum)
YOU HAD AN ENDOSCOPIC PROCEDURE TODAY AT [facility]: Refer to the procedure report that was given to you for any specific questions about what was found during the examination.  If the procedure report does not answer your questions, please call your gastroenterologist to clarify.  If you requested that your care partner not be given the details of your procedure findings, then the procedure report has been included in a sealed envelope for you to review at your convenience later.  YOU SHOULD EXPECT: Some feelings of bloating in the abdomen. Passage of more gas than usual.  Walking can help get rid of the air that was put into your GI tract during the procedure and reduce the bloating. If you had a lower endoscopy (such as a colonoscopy or flexible sigmoidoscopy) you may notice spotting of blood in your stool or on the toilet paper. If you underwent a bowel prep for your procedure, then you may not have a normal bowel movement for a few days.  DIET: Your first meal following the procedure should be a light meal and then it is ok to progress to your normal diet.  A half-sandwich or bowl of soup is an example of a good first meal.  Heavy or fried foods are harder to digest and may make you feel nauseous or bloated.  Likewise meals heavy in dairy and vegetables can cause extra gas to form and this can also increase the bloating.  Drink plenty of fluids but you should avoid alcoholic beverages for 24 hours.  ACTIVITY: Your care partner should take you home directly after the procedure.  You should plan to take it easy, moving slowly for the rest of the day.  You can resume normal activity the day after the procedure however you should NOT DRIVE or use heavy machinery for 24 hours (because of the sedation medicines used during the test).    SYMPTOMS TO REPORT IMMEDIATELY: A gastroenterologist can be reached at any hour.  During normal business hours, 8:30 AM to 5:00 PM Monday through Friday,  call [phone].  After hours and on weekends, please call the GI answering service at [phone]  Emergency number who will take a message and have the physician on call contact you.   Following lower endoscopy (colonoscopy or flexible sigmoidoscopy):  Excessive amounts of blood in the stool  Significant tenderness or worsening of abdominal pains  Swelling of the abdomen that is new, acute  Fever of 100F or higher FOLLOW UP: If any biopsies were taken you will be contacted by phone or by letter within the next 1-3 weeks.  Call your gastroenterologist if you have not heard about the biopsies in 3 weeks.  Our staff will call the home number listed on your records the next business day following your procedure to check on you and address any questions or concerns that you may have at that time regarding the information given to you following your procedure. This is a courtesy call and so if there is no answer at the home number and we have not heard from you through the emergency physician on call, we will assume that you have returned to your regular daily activities without incident.  SIGNATURES/CONFIDENTIALITY: You and/or your care partner have signed paperwork which will be entered into your electronic medical record.  These signatures attest to the fact that that the information above on your After Visit Summary has been reviewed and is understood.  Full responsibility of the confidentiality  of this discharge information lies with you and/or your care-partner.  Repeat colon in 10 years

## 2012-10-11 NOTE — Progress Notes (Signed)
The pt tolerated the colonoscopy very well. Maw   

## 2012-10-11 NOTE — Progress Notes (Signed)
Patient did not experience any of the following events: a burn prior to discharge; a fall within the facility; wrong site/side/patient/procedure/implant event; or a hospital transfer or hospital admission upon discharge from the facility. (G8907)Patient did not have preoperative order for IV antibiotic SSI prophylaxis. (G8918) ewm 

## 2012-10-11 NOTE — Op Note (Signed)
[facility] [address] [place], [address]   COLONOSCOPY PROCEDURE REPORT  PATIENT: Justin, [name]  MR#: [id number] BIRTHDATE: 1960-04-19 , 52  yrs. old GENDER: Male ENDOSCOPIST: Rachael Fee, MD REFERRED OZ:HYQMVH Duncan, M.D. PROCEDURE DATE:  10/11/2012 PROCEDURE:   Colonoscopy, screening First Screening Colonoscopy - Avg.  risk and is 50 yrs.  old or older Yes.  Prior Negative Screening - Now for repeat screening. N/A  History of Adenoma - Now for follow-up colonoscopy & has been > or = to 3 yrs.  N/A  Polyps Removed Today? No.  Recommend repeat exam, <10 yrs? No. ASA CLASS:   Class II INDICATIONS:average risk screening. MEDICATIONS: Fentanyl 50 mcg IV, Versed 6 mg IV, and These medications were titrated to patient response per physician's verbal order  DESCRIPTION OF PROCEDURE:   After the risks benefits and alternatives of the procedure were thoroughly explained, informed consent was obtained.  A digital rectal exam revealed no abnormalities of the rectum.   The LB [id number]-[id number]  endoscope was introduced through the anus and advanced to the cecum, which was identified by both the appendix and ileocecal valve. No adverse events experienced.   The quality of the prep was good, using MoviPrep  The instrument was then slowly withdrawn as the colon was fully examined.   COLON FINDINGS: A normal appearing cecum, ileocecal valve, and appendiceal orifice were identified.  The ascending, hepatic flexure, transverse, splenic flexure, descending, sigmoid colon and rectum appeared unremarkable.  No polyps or cancers were seen. Retroflexed views revealed no abnormalities. The time to cecum=2 minutes 14 seconds.  Withdrawal time=8 minutes 02 seconds.  The scope was withdrawn and the procedure completed. COMPLICATIONS: There were no complications.  ENDOSCOPIC IMPRESSION: Normal colon No polyps or cancers  RECOMMENDATIONS: You should continue to follow  colorectal cancer screening guidelines for "routine risk" patients with a repeat colonoscopy in 10 years.   eSigned:  Rachael Fee, MD 10/11/2012 11:09 AM

## 2012-10-12 ENCOUNTER — Telehealth: Payer: Self-pay

## 2012-10-12 NOTE — Telephone Encounter (Signed)
  Follow up Call-  Call back number 10/11/2012  Post procedure Call Back phone  # [phone]  Permission to leave phone message Yes     Patient questions:  Do you have a fever, pain , or abdominal swelling? no Pain Score  0 *  Have you tolerated food without any problems? yes  Have you been able to return to your normal activities? yes  Do you have any questions about your discharge instructions: Diet   no Medications  no Follow up visit  no  Do you have questions or concerns about your Care? no  Actions: * If pain score is 4 or above: No action needed, pain <4.

## 2012-10-17 ENCOUNTER — Encounter: Payer: Self-pay | Admitting: Family Medicine

## 2012-10-17 ENCOUNTER — Ambulatory Visit (INDEPENDENT_AMBULATORY_CARE_PROVIDER_SITE_OTHER): Payer: BC Managed Care – PPO | Admitting: Family Medicine

## 2012-10-17 VITALS — BP 122/80 | HR 61 | Temp 97.8°F | Wt 185.2 lb

## 2012-10-17 DIAGNOSIS — R739 Hyperglycemia, unspecified: Secondary | ICD-10-CM

## 2012-10-17 DIAGNOSIS — E7800 Pure hypercholesterolemia, unspecified: Secondary | ICD-10-CM

## 2012-10-17 DIAGNOSIS — R7309 Other abnormal glucose: Secondary | ICD-10-CM

## 2012-10-17 DIAGNOSIS — E039 Hypothyroidism, unspecified: Secondary | ICD-10-CM

## 2012-10-17 DIAGNOSIS — I10 Essential (primary) hypertension: Secondary | ICD-10-CM

## 2012-10-17 MED ORDER — AMLODIPINE BESYLATE 10 MG PO TABS: 10.0000 mg | ORAL_TABLET | Freq: Every day | ORAL | Status: DC

## 2012-10-17 NOTE — Assessment & Plan Note (Signed)
Recheck lipids with next draw.

## 2012-10-17 NOTE — Addendum Note (Signed)
Addended by: Joaquim Nam on: 10/17/2012 08:43 AM   Modules accepted: Orders

## 2012-10-17 NOTE — Progress Notes (Signed)
Diabetes:  No meds Hypoglycemic episodes: not checked, no sx unless prolonged fasting Hyperglycemic episodes: not checked Feet problems:no Blood Sugars averaging: not checked A1c much improved.  Discussed.   Weight loss noted.  Diet changed and exercising more.  Walking daily.   He cut out regular soda; drinking more water now.  Wheat bread now.  He cut back on french fries, still eating some.  Regular diet o/w.  He feels better.   Hypothyroid.  Now with TSH wnl.   No ADE, no neck mass.   Heartburn resolved with prilosec and weight loss. He still has a some throat clearing and this has been going on months now. On ACE.    Meds, vitals, and allergies reviewed.   ROS: See HPI.  Otherwise negative.    GEN: nad, alert and oriented HEENT: mucous membranes moist, OP wnl, TM wnl NECK: supple w/o LA, no TMG CV: rrr. PULM: ctab, no inc wob ABD: soft, +bs EXT: no edema SKIN: no acute rash

## 2012-10-17 NOTE — Assessment & Plan Note (Signed)
Stop ACE for now (benazepril), see if cough/throat clearing improves.  He agrees.

## 2012-10-17 NOTE — Assessment & Plan Note (Addendum)
Sugar much improved with weight loss and A1c improved.  Recheck A1c in about 6 months.

## 2012-10-17 NOTE — Patient Instructions (Addendum)
I would get a flu shot each fall.   Change to plain amlodipine and let me know if the throat clearing improves (or not). Keep working on [employer].  Thanks for your effort.  Recheck A1c in about 6 months before a visit.  Take care.

## 2012-10-17 NOTE — Assessment & Plan Note (Signed)
Controlled, continue current meds.   

## 2012-12-07 ENCOUNTER — Other Ambulatory Visit: Payer: Self-pay | Admitting: Family Medicine

## 2012-12-29 ENCOUNTER — Other Ambulatory Visit: Payer: Self-pay

## 2013-01-30 ENCOUNTER — Encounter: Payer: Self-pay | Admitting: Pulmonary Disease

## 2013-01-30 ENCOUNTER — Ambulatory Visit (INDEPENDENT_AMBULATORY_CARE_PROVIDER_SITE_OTHER): Payer: BC Managed Care – PPO | Admitting: Pulmonary Disease

## 2013-01-30 VITALS — BP 110/84 | HR 76 | Ht 68.0 in | Wt 183.0 lb

## 2013-01-30 DIAGNOSIS — G4733 Obstructive sleep apnea (adult) (pediatric): Secondary | ICD-10-CM

## 2013-01-30 NOTE — Patient Instructions (Signed)
Flu shot today Follow up in 1 year 

## 2013-01-30 NOTE — Progress Notes (Addendum)
Chief Complaint  Patient presents with  . Sleep Apnea    Using CPAP every night. Denies problems with machine, mask or pressure.    History of Present Illness: [name] is a 52 y.o. male with severe OSA.  He uses his CPAP for 7 to 8 hours per night.  He feels this helps, but has felt more tired recently.  He has a full face mask, and this fits well.  He had an episode several weeks ago in which he felt like he was in a dream, but couldn't wake up.  TESTS: PSG 11/08/11>>AHI 31.6, SpO2 low 85%. CPAP 08/01/12 to 01/29/13 >> Used on 182 of 182 nights with average 7 hrs 49 min.  Average AHI 1.9 with median CPAP 8 cm H2O and 95 th percentile CPAP 11 cm H2O.   [name]  has a past medical history of Allergy; Hypertension; Thyroid disease; Heart murmur; Fatty liver; Bicuspid aortic valve; OSA (obstructive sleep apnea) (09/14/2011); and Other abnormal glucose.  [name]  has past surgical history that includes Cardiovascular stress test (03/07); doppler echocardiography (06/04); Biopsy thyroid (08/19/07); and Appendectomy (10/09).  Prior to Admission medications   Medication Sig Start Date End Date Taking? Authorizing Provider  albuterol (PROVENTIL HFA;VENTOLIN HFA) 108 (90 BASE) MCG/ACT inhaler Inhale 2 puffs into the lungs every 6 (six) hours as needed. 08/12/12  Yes Joaquim Nam, MD  amLODipine (NORVASC) 10 MG tablet Take 1 tablet (10 mg total) by mouth daily. 10/17/12  Yes Joaquim Nam, MD  Azelastine HCl (ASTEPRO) 0.15 % SOLN TAKE 2 SPRAYS IN EACH NOSTRIL TWO TIMES A DAY FOR ALLERGY 08/12/12  Yes Joaquim Nam, MD  fish oil-omega-3 fatty acids 1000 MG capsule Take by mouth daily. Pt doesn't know dosage   Yes Historical Provider, MD  fluticasone (FLONASE) 50 MCG/ACT nasal spray Place 1-2 sprays into the nose daily as needed. 08/12/12  Yes Joaquim Nam, MD  Fluticasone-Salmeterol (ADVAIR DISKUS) 250-50 MCG/DOSE AEPB Inhale 1 puff into the lungs 2 (two) times daily. 08/12/12   Yes Joaquim Nam, MD  levothyroxine (SYNTHROID, LEVOTHROID) 125 MCG tablet Take 1 tablet (125 mcg total) by mouth daily before breakfast. 08/12/12  Yes Joaquim Nam, MD  omeprazole (PRILOSEC) 20 MG capsule Take 20 mg by mouth daily.   Yes Historical Provider, MD    No Known Allergies   Physical Exam:  General - No distress ENT - No sinus tenderness, no oral exudate, no LAN, MP 3, nasal septal deviation  Cardiac - s1s2 regular, 2/6 SM Chest - No wheeze/rales/dullness Back - No focal tenderness Abd - Soft, non-tender Ext - No edema Neuro - Normal strength Skin - No rashes Psych - normal mood, and behavior   Assessment/Plan:  Coralyn Helling, MD [facility] Pager:  [phone]

## 2013-02-02 NOTE — Assessment & Plan Note (Signed)
He reports compliance with CPAP and benefit from therapy.

## 2013-02-15 ENCOUNTER — Other Ambulatory Visit: Payer: Self-pay | Admitting: Family Medicine

## 2013-08-11 ENCOUNTER — Other Ambulatory Visit: Payer: Self-pay | Admitting: Family Medicine

## 2013-08-14 ENCOUNTER — Other Ambulatory Visit: Payer: Self-pay | Admitting: Family Medicine

## 2013-08-26 ENCOUNTER — Other Ambulatory Visit: Payer: Self-pay | Admitting: Family Medicine

## 2013-09-10 ENCOUNTER — Other Ambulatory Visit: Payer: Self-pay | Admitting: Family Medicine

## 2013-09-10 DIAGNOSIS — Z125 Encounter for screening for malignant neoplasm of prostate: Secondary | ICD-10-CM

## 2013-09-10 DIAGNOSIS — I10 Essential (primary) hypertension: Secondary | ICD-10-CM

## 2013-09-10 DIAGNOSIS — E039 Hypothyroidism, unspecified: Secondary | ICD-10-CM

## 2013-09-10 DIAGNOSIS — E7800 Pure hypercholesterolemia, unspecified: Secondary | ICD-10-CM

## 2013-09-10 DIAGNOSIS — R739 Hyperglycemia, unspecified: Secondary | ICD-10-CM

## 2013-09-11 ENCOUNTER — Other Ambulatory Visit (INDEPENDENT_AMBULATORY_CARE_PROVIDER_SITE_OTHER): Payer: BC Managed Care – PPO

## 2013-09-11 DIAGNOSIS — E039 Hypothyroidism, unspecified: Secondary | ICD-10-CM

## 2013-09-11 DIAGNOSIS — Z125 Encounter for screening for malignant neoplasm of prostate: Secondary | ICD-10-CM

## 2013-09-11 DIAGNOSIS — R7309 Other abnormal glucose: Secondary | ICD-10-CM

## 2013-09-11 DIAGNOSIS — R739 Hyperglycemia, unspecified: Secondary | ICD-10-CM

## 2013-09-11 DIAGNOSIS — I10 Essential (primary) hypertension: Secondary | ICD-10-CM

## 2013-09-11 LAB — HEMOGLOBIN A1C: Hgb A1c MFr Bld: 5.7 % (ref 4.6–6.5)

## 2013-09-11 LAB — COMPREHENSIVE METABOLIC PANEL
ALT: 99 U/L — ABNORMAL HIGH (ref 0–53)
AST: 77 U/L — ABNORMAL HIGH (ref 0–37)
Albumin: 4.5 g/dL (ref 3.5–5.2)
Alkaline Phosphatase: 128 U/L — ABNORMAL HIGH (ref 39–117)
BUN: 16 mg/dL (ref 6–23)
CO2: 27 mEq/L (ref 19–32)
Calcium: 9.7 mg/dL (ref 8.4–10.5)
Chloride: 106 mEq/L (ref 96–112)
Creatinine, Ser: 1 mg/dL (ref 0.4–1.5)
GFR: 83.64 mL/min (ref >60.00)
Glucose, Bld: 100 mg/dL — ABNORMAL HIGH (ref 70–99)
Potassium: 4.4 mEq/L (ref 3.5–5.1)
Sodium: 139 mEq/L (ref 135–145)
Total Bilirubin: 0.7 mg/dL (ref 0.2–1.2)
Total Protein: 7.7 g/dL (ref 6.0–8.3)

## 2013-09-11 LAB — LIPID PANEL
Cholesterol: 178 mg/dL (ref 0–200)
HDL: 53.8 mg/dL (ref >39.00)
LDL Cholesterol: 113 mg/dL — ABNORMAL HIGH (ref 0–99)
NonHDL: 124.2
Total CHOL/HDL Ratio: 3
Triglycerides: 58 mg/dL (ref 0.0–149.0)
VLDL: 11.6 mg/dL (ref 0.0–40.0)

## 2013-09-11 LAB — TSH: TSH: 0.45 u[IU]/mL (ref 0.35–4.50)

## 2013-09-11 LAB — PSA: PSA: 0.88 ng/mL (ref 0.10–4.00)

## 2013-09-15 ENCOUNTER — Ambulatory Visit (INDEPENDENT_AMBULATORY_CARE_PROVIDER_SITE_OTHER): Payer: BC Managed Care – PPO | Admitting: Family Medicine

## 2013-09-15 ENCOUNTER — Encounter: Payer: Self-pay | Admitting: Family Medicine

## 2013-09-15 VITALS — BP 122/84 | HR 71 | Temp 97.9°F | Ht 68.5 in | Wt 174.5 lb

## 2013-09-15 DIAGNOSIS — F40243 Fear of flying: Secondary | ICD-10-CM

## 2013-09-15 DIAGNOSIS — E039 Hypothyroidism, unspecified: Secondary | ICD-10-CM

## 2013-09-15 DIAGNOSIS — R739 Hyperglycemia, unspecified: Secondary | ICD-10-CM

## 2013-09-15 DIAGNOSIS — Q2381 Bicuspid aortic valve: Secondary | ICD-10-CM

## 2013-09-15 DIAGNOSIS — R7309 Other abnormal glucose: Secondary | ICD-10-CM

## 2013-09-15 DIAGNOSIS — I10 Essential (primary) hypertension: Secondary | ICD-10-CM

## 2013-09-15 DIAGNOSIS — Z0000 Encounter for general adult medical examination without abnormal findings: Secondary | ICD-10-CM

## 2013-09-15 DIAGNOSIS — Q231 Congenital insufficiency of aortic valve: Secondary | ICD-10-CM

## 2013-09-15 MED ORDER — ALBUTEROL SULFATE HFA 108 (90 BASE) MCG/ACT IN AERS: 2.0000 | INHALATION_SPRAY | Freq: Four times a day (QID) | RESPIRATORY_TRACT | Status: DC | PRN

## 2013-09-15 MED ORDER — AMLODIPINE BESYLATE 10 MG PO TABS: 5.0000 mg | ORAL_TABLET | Freq: Every day | ORAL | Status: DC

## 2013-09-15 MED ORDER — FLUTICASONE-SALMETEROL 250-50 MCG/DOSE IN AEPB: INHALATION_SPRAY | RESPIRATORY_TRACT | Status: DC

## 2013-09-15 MED ORDER — LEVOTHYROXINE SODIUM 125 MCG PO TABS: 125.0000 ug | ORAL_TABLET | Freq: Every day | ORAL | Status: DC

## 2013-09-15 MED ORDER — AZELASTINE HCL 0.15 % NA SOLN: NASAL | Status: DC

## 2013-09-15 MED ORDER — FLUTICASONE PROPIONATE 50 MCG/ACT NA SUSP: 1.0000 | Freq: Every day | NASAL | Status: DC | PRN

## 2013-09-15 NOTE — Patient Instructions (Addendum)
Thank you for your effort.  If you need help with medicine before a plane flight, then notify me.   Cut the amlodipine back to 5mg  a day.   If your BP is 130s/80s or lower, then continue with that dose.  Take care. Glad to see you.  Recheck in 1 year.

## 2013-09-15 NOTE — Progress Notes (Signed)
Pre visit review using our clinic review tool, if applicable. No additional management support is needed unless otherwise documented below in the visit note.  CPE- See plan.  Routine anticipatory guidance given to patient.  See health maintenance. Tetanus 2010 Flu shot prev done.  Shingles and PNA shot not due yet. D/w pt.  FH prostate cancer- PSA wnl.   FH colon cancer.  Colonoscopy wnl 2014 Living will d/w pt.  Wife and his daughter Marye Round would be designated equally.  Diet and exercise d/w pt.  Down 30lbs with diet and exercise.  Intentional weight loss.   I thanked him for his effort.  He cut out soda.  He feels better.  He is no longer diabetic.    Hypertension:   Using medication without problems: yes generally but occ lightheaded.   Chest pain with exertion:no Edema:no Short of breath:no  Hypothyroidism.  TSH wnl.  No neck mass per patient. Compliant with meds.   LFT elevation with h/o fatty liver.  Weight loss noted.  Labs d/w pt.   Bicuspid aortic valve.  H/o murmur.  Has routine cards f/u pending.   He has a fear of flying and is going to potentially need a BZD for flight anxiety.  He'll call back when/if needed.    He had some L chest pain that wasn't exertional, happened only at rest, and and resolved now.  Not noted by patient in the last few weeks.    PMH and SH reviewed  Meds, vitals, and allergies reviewed.   ROS: See HPI.  Otherwise negative.    GEN: nad, alert and oriented HEENT: mucous membranes moist NECK: supple w/o LA, no tmg CV: rrr. Murmur noted.  PULM: ctab, no inc wob ABD: soft, +bs EXT: no edema SKIN: no acute rash

## 2013-09-18 DIAGNOSIS — F40243 Fear of flying: Secondary | ICD-10-CM | POA: Insufficient documentation

## 2013-09-18 NOTE — Assessment & Plan Note (Signed)
Controlled, continue current dose.

## 2013-09-18 NOTE — Assessment & Plan Note (Signed)
Call back as needed for potential BZD use.  He agrees.

## 2013-09-18 NOTE — Assessment & Plan Note (Signed)
Routine anticipatory guidance given to patient.  See health maintenance. Tetanus 2010 Flu shot prev done.  Shingles and PNA shot not due yet. D/w pt.  FH prostate cancer- PSA wnl.   FH colon cancer.  Colonoscopy wnl 2014 Living will d/w pt.  Wife and his daughter Marye Round would be designated equally.  Diet and exercise d/w pt.  Down 30lbs with diet and exercise.  Intentional weight loss.   I thanked him for his effort.  He cut out soda.  He feels better.  He is no longer diabetic.

## 2013-09-18 NOTE — Assessment & Plan Note (Signed)
Has routine f/u with cards pending.

## 2013-09-18 NOTE — Assessment & Plan Note (Signed)
A1c normalized, not diabetc now. I thanked him for his effort.

## 2013-09-18 NOTE — Assessment & Plan Note (Addendum)
With some likely low BPs.  Cut the amlodipine back to 5mg  a day. If BP is 130s/80s or lower, then he'll continue with that dose.

## 2013-11-03 ENCOUNTER — Encounter: Payer: Self-pay | Admitting: Internal Medicine

## 2013-11-03 ENCOUNTER — Ambulatory Visit (INDEPENDENT_AMBULATORY_CARE_PROVIDER_SITE_OTHER): Payer: BC Managed Care – PPO | Admitting: Internal Medicine

## 2013-11-03 VITALS — BP 124/78 | HR 62 | Ht 69.0 in | Wt 180.0 lb

## 2013-11-03 DIAGNOSIS — I350 Nonrheumatic aortic (valve) stenosis: Secondary | ICD-10-CM

## 2013-11-03 DIAGNOSIS — I359 Nonrheumatic aortic valve disorder, unspecified: Secondary | ICD-10-CM

## 2013-11-03 DIAGNOSIS — I10 Essential (primary) hypertension: Secondary | ICD-10-CM

## 2013-11-03 NOTE — Progress Notes (Signed)
HPI patient is a 53 year old with aortic stenosis ,reported bicuspid av. I saw the patinet in 2013 Since seen he has done well.  Active  Occasional dizziness  Was told to back down on BP meds  Notes occasional L sided chest presssure  Not with activity  \ NO SOB   Has lost wt  Walking 1 mile per day .  No Known Allergies  Current Outpatient Prescriptions  Medication Sig Dispense Refill  . albuterol (PROVENTIL HFA;VENTOLIN HFA) 108 (90 BASE) MCG/ACT inhaler Inhale 2 puffs into the lungs every 6 (six) hours as needed.  18 g  12  . amLODipine (NORVASC) 10 MG tablet Take 0.5-1 tablets (5-10 mg total) by mouth daily.  90 tablet  3  . Azelastine HCl (ASTEPRO) 0.15 % SOLN TAKE 2 SPRAYS IN EACH NOSTRIL TWO TIMES A DAY FOR ALLERGY  30 mL  12  . fish oil-omega-3 fatty acids 1000 MG capsule Take by mouth daily. Pt doesn't know dosage      . fluticasone (FLONASE) 50 MCG/ACT nasal spray Place 1-2 sprays into both nostrils daily as needed.  16 g  12  . Fluticasone-Salmeterol (ADVAIR DISKUS) 250-50 MCG/DOSE AEPB INHALE 1 PUFF INTO THE LUNGS TWO TIMES DAILY  60 each  12  . levothyroxine (SYNTHROID, LEVOTHROID) 125 MCG tablet Take 1 tablet (125 mcg total) by mouth daily before breakfast.  90 tablet  3  . omeprazole (PRILOSEC) 20 MG capsule Take 20 mg by mouth daily.       No current facility-administered medications for this visit.    Past Medical History  Diagnosis Date  . Allergy   . Hypertension   . Thyroid disease   . Heart murmur   . Fatty liver     with h/o elevated LFT's  . Bicuspid aortic valve   . OSA (obstructive sleep apnea) 09/14/2011    PSG 11/08/11>>AHI 31.6, SpO2 low 85%. wears CPAP  . Other abnormal glucose     diet controlled diabetic    Past Surgical History  Procedure Laterality Date  . Cardiovascular stress test  03/07    Negative 06/05  . Doppler echocardiography  06/04  . Biopsy thyroid  08/19/07    Attempted, no tissue obtained  . Appendectomy  10/09    Emergency     Family History  Problem Relation Age of Onset  . Asthma Mother   . Cancer Father     Died when pt was 38 of CA with mets, site unknown, COPD  . COPD Father   . Heart disease Sister     Heart stopped  . Prostate cancer Maternal Uncle   . Colon cancer Maternal Grandmother   . Heart disease Maternal Grandfather     MI, 47 YOA  . Esophageal cancer Neg Hx   . Rectal cancer Neg Hx   . Stomach cancer Neg Hx     History   Social History  . Marital Status: Married    Spouse Name: N/A    Number of Children: 1  . Years of Education: N/A   Occupational History  . [employer]     Manages "front" and writes orders --in [place] History Main Topics  . Smoking status: Never Smoker   . Smokeless tobacco: Never Used  . Alcohol Use: No     Comment: rare  . Drug Use: No  . Sexual Activity: Not on file   Other Topics Concern  . 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  They are negative to the above problem except as previously stated.  Vital Signs: BP 124/78  P 62  Wt 180 (down from 194 on last visity)  Physical Exam Patient is in NAD HEENT:  Normocephalic, atraumatic. EOMI, PERRLA.  Neck: JVP is normal.  No bruits.  Lungs: clear to auscultation. No rales no wheezes.  Heart: Regular rate and rhythm. Normal S1, S2. No S3.  GR III/VI systolic murmur at base.  No diastolc murmurs.[name] PMI not displaced.  Abdomen:  Supple, nontender. Normal bowel sounds. No masses. No hepatomegaly.  Extremities:   Good distal pulses throughout. No lower extremity edema.  Musculoskeletal :moving all extremities.  Neuro:   alert and oriented x3.  CN II-XII grossly intact.  EKG  SR  69  Bpm Assessment and Plan:  1.  Bicuspid AV Murmur appears mild to mod  Would sched echo    2  HTN  Patient says he is dizzy at  times  Would recomm that he try cutting back on amlodipine to 5 and see if helps  3  CP  Fleeting  Not associated with acitvity  I do not think angina  4.  HCM  Lipids are good.   5.  Reflux  Denies problems

## 2013-11-03 NOTE — Patient Instructions (Signed)
Your physician has requested that you have an echocardiogram. Echocardiography is a painless test that uses sound waves to create images of your heart. It provides your doctor with information about the size and shape of your heart and how well your heart's chambers and valves are working. This procedure takes approximately one hour. There are no restrictions for this procedure.  Your physician recommends that you continue on your current medications as directed. Please refer to the Current Medication list given to you today. Your physician wants you to follow-up in: 2 [place].  You will receive a reminder letter in the mail two months in advance. If you don't receive a letter, please call our office to schedule the follow-up appointment.

## 2013-11-09 ENCOUNTER — Ambulatory Visit (HOSPITAL_COMMUNITY): Payer: BC Managed Care – PPO | Attending: Internal Medicine | Admitting: Radiology

## 2013-11-09 DIAGNOSIS — I350 Nonrheumatic aortic (valve) stenosis: Secondary | ICD-10-CM

## 2013-11-09 DIAGNOSIS — E7800 Pure hypercholesterolemia, unspecified: Secondary | ICD-10-CM | POA: Diagnosis not present

## 2013-11-09 DIAGNOSIS — Q231 Congenital insufficiency of aortic valve: Secondary | ICD-10-CM

## 2013-11-09 DIAGNOSIS — I10 Essential (primary) hypertension: Secondary | ICD-10-CM | POA: Diagnosis not present

## 2013-11-09 DIAGNOSIS — I359 Nonrheumatic aortic valve disorder, unspecified: Secondary | ICD-10-CM | POA: Diagnosis not present

## 2013-11-09 DIAGNOSIS — G4733 Obstructive sleep apnea (adult) (pediatric): Secondary | ICD-10-CM | POA: Insufficient documentation

## 2013-11-09 DIAGNOSIS — K7689 Other specified diseases of liver: Secondary | ICD-10-CM | POA: Insufficient documentation

## 2013-11-09 DIAGNOSIS — R011 Cardiac murmur, unspecified: Secondary | ICD-10-CM | POA: Insufficient documentation

## 2013-11-09 NOTE — Progress Notes (Signed)
Echocardiogram performed.  

## 2013-11-13 ENCOUNTER — Other Ambulatory Visit: Payer: Self-pay | Admitting: Family Medicine

## 2014-01-25 ENCOUNTER — Telehealth: Payer: Self-pay

## 2014-01-25 NOTE — Telephone Encounter (Signed)
Pt left v/m; pt request generic substitute for Advair diskus; too expensive for pt to purchase.Please advise.[facility]

## 2014-01-25 NOTE — Telephone Encounter (Signed)
Have him check with his insurance/pharmacy to see what they'll cover, to see what is cheaper. Let me know.  Thanks.

## 2014-01-25 NOTE — Telephone Encounter (Signed)
Patient advised.

## 2014-01-31 ENCOUNTER — Encounter: Payer: Self-pay | Admitting: Internal Medicine

## 2014-01-31 ENCOUNTER — Ambulatory Visit (INDEPENDENT_AMBULATORY_CARE_PROVIDER_SITE_OTHER): Payer: BC Managed Care – PPO | Admitting: Internal Medicine

## 2014-01-31 VITALS — BP 126/84 | HR 73 | Temp 98.3°F | Wt 183.0 lb

## 2014-01-31 DIAGNOSIS — J069 Acute upper respiratory infection, unspecified: Secondary | ICD-10-CM

## 2014-01-31 MED ORDER — AZITHROMYCIN 250 MG PO TABS: ORAL_TABLET | ORAL | Status: DC

## 2014-01-31 NOTE — Patient Instructions (Signed)

## 2014-01-31 NOTE — Progress Notes (Signed)
HPI  Pt presents to the clinic today with c/o sore throat and cough. He reports this started 5 days ago. He denies difficulty swallowing. The cough is non productive. It seems to be worse in the morning. He denies fever, chills or body aches. He has tried salt water gargles with some relief. He has not had sick contacts that he is aware of. He does have a history of seasonal allergies. He does have astelin and flonase but he is not currently using it.  Review of Systems      Past Medical History  Diagnosis Date  . Allergy   . Hypertension   . Thyroid disease   . Heart murmur   . Fatty liver     with h/o elevated LFT's  . Bicuspid aortic valve   . OSA (obstructive sleep apnea) 09/14/2011    PSG 11/08/11>>AHI 31.6, SpO2 low 85%. wears CPAP  . Other abnormal glucose     diet controlled diabetic    Family History  Problem Relation Age of Onset  . Asthma Mother   . Cancer Father     Died when pt was 54 of CA with mets, site unknown, COPD  . COPD Father   . Heart disease Sister     Heart stopped  . Prostate cancer Maternal Uncle   . Colon cancer Maternal Grandmother   . Heart disease Maternal Grandfather     MI, 56 YOA  . Esophageal cancer Neg Hx   . Rectal cancer Neg Hx   . Stomach cancer Neg Hx     History   Social History  . Marital Status: Married    Spouse Name: N/A    Number of Children: 1  . Years of Education: N/A   Occupational History  . [employer]     Manages "front" and writes orders --in [place] History Main Topics  . Smoking status: Never Smoker   . Smokeless tobacco: Never Used  . Alcohol Use: No     Comment: rare  . Drug Use: No  . Sexual Activity: Not on file   Other Topics Concern  . 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   HEENT:  Positive runny nose, sore throat. Denies eye redness, eye pain, pressure behind the eyes, facial pain, nasal congestion, ear pain, ringing in the ears, wax buildup, or bloody nose. Respiratory: Positive cough. Denies difficulty breathing or shortness of breath.  Cardiovascular: Denies chest pain, chest tightness, palpitations or swelling in the hands or feet.   No other specific complaints in a complete review of systems (except as listed in HPI above).  Objective:   BP 126/84 mmHg  Pulse 73  Temp(Src) 98.3 F (36.8 C) (Oral)  Wt 183 lb (83.008 kg)  SpO2 98% Wt Readings from Last 3 Encounters:  01/31/14 183 lb (83.008 kg)  11/03/13 180 lb (81.647 kg)  09/15/13 174 lb 8 oz (79.153 kg)     General: Appears his stated age, ill appearing  in NAD. HEENT: Head: normal shape and size;  Ears: Tm's pink but intact, normal light reflex, + effusion bilaterally; Nose: mucosa pink and moist, septum midline; Throat/Mouth:  Teeth present, mucosa erythematous and moist, no exudate noted, no lesions or ulcerations noted. Cervical adenopathy noted. Cardiovascular: Normal rate  and rhythm. S1,S2 noted. Murmur noted. No  rubs or gallops noted.  Pulmonary/Chest: Normal effort and positive vesicular breath sounds. No respiratory distress. No wheezes, rales or ronchi noted.      Assessment & Plan:   Upper Respiratory Infection:  Try symptomatic care for a few more days Get some rest and drink plenty of water Do salt water garglesIbuprofen for the sore throat Will print Rx for Azithromax x 5 days- to start on Saturday if getting worse Delsym OTC for cough  RTC as needed or if symptoms persist.

## 2014-01-31 NOTE — Progress Notes (Signed)
Pre visit review using our clinic review tool, if applicable. No additional management support is needed unless otherwise documented below in the visit note. 

## 2014-02-08 ENCOUNTER — Ambulatory Visit (INDEPENDENT_AMBULATORY_CARE_PROVIDER_SITE_OTHER): Payer: BC Managed Care – PPO | Admitting: Pulmonary Disease

## 2014-02-08 ENCOUNTER — Encounter: Payer: Self-pay | Admitting: Pulmonary Disease

## 2014-02-08 VITALS — BP 132/82 | HR 70 | Temp 98.4°F | Ht 69.0 in | Wt 188.0 lb

## 2014-02-08 DIAGNOSIS — G4733 Obstructive sleep apnea (adult) (pediatric): Secondary | ICD-10-CM

## 2014-02-08 NOTE — Patient Instructions (Signed)
Follow up in 1 year.

## 2014-02-08 NOTE — Progress Notes (Signed)
Chief Complaint  Patient presents with  . Follow-up    Wears CPAP nightly. Denies mask or pressure issues. Pt states that at times he wakes up in middle of night from the pressure increasing/too much, has to reset the machine.     History of Present Illness: [name] is a 53 y.o. male with severe OSA.  He has been doing well with CPAP.  He has full face mask.  He can't sleep w/o his machine.  He occasionally feels like his pressure gets too high >> this doesn't happen often.  TESTS: PSG 11/08/11>>AHI 31.6, SpO2 low 85%. Auto CPAP 08/22/13 to 09/20/13 >> used on 29 of 30 nights with average 7 hrs and 30 min.  Average AHI is 1.7 with median CPAP 7 cm H2O and 95 th percentile CPAP 10 cm H20. Echo 11/09/13 >> EF 55 to 30%, grade 1 diastolic dysfx, mod AS  PMHx >> HTN, Hypothyroidism, GERD, Bicuspid aortic valve  PSHx, Medications, Allergies, Fhx, Shx reviewed.  Physical Exam:  General - No distress ENT - No sinus tenderness, no oral exudate, no LAN, MP 3, nasal septal deviation  Cardiac - s1s2 regular, 2/6 SM Chest - No wheeze/rales/dullness Back - No focal tenderness Abd - Soft, non-tender Ext - No edema Neuro - Normal strength Skin - No rashes Psych - normal mood, and behavior   Assessment/Plan:  Obstructive sleep apnea >> he is compliant with therapy and reports benefit from CPAP. His issues with pressure likely related to intermittent mask leak. Plan: - continue auto CPAP - discussed proper mask fit   Chesley Mires, MD [facility]/Critical [facility]/Sleep Pager:  [phone]

## 2014-09-20 ENCOUNTER — Other Ambulatory Visit: Payer: Self-pay | Admitting: Family Medicine

## 2015-01-01 ENCOUNTER — Other Ambulatory Visit: Payer: Self-pay | Admitting: *Deleted

## 2015-01-01 MED ORDER — AMLODIPINE BESYLATE 10 MG PO TABS: ORAL_TABLET | ORAL | Status: DC

## 2015-01-01 MED ORDER — LEVOTHYROXINE SODIUM 125 MCG PO TABS: ORAL_TABLET | ORAL | Status: DC

## 2015-01-31 ENCOUNTER — Other Ambulatory Visit: Payer: Self-pay | Admitting: Family Medicine

## 2015-01-31 DIAGNOSIS — R739 Hyperglycemia, unspecified: Secondary | ICD-10-CM

## 2015-01-31 DIAGNOSIS — I10 Essential (primary) hypertension: Secondary | ICD-10-CM

## 2015-01-31 DIAGNOSIS — Z125 Encounter for screening for malignant neoplasm of prostate: Secondary | ICD-10-CM

## 2015-01-31 DIAGNOSIS — E039 Hypothyroidism, unspecified: Secondary | ICD-10-CM

## 2015-02-04 ENCOUNTER — Other Ambulatory Visit (INDEPENDENT_AMBULATORY_CARE_PROVIDER_SITE_OTHER): Payer: 59

## 2015-02-04 DIAGNOSIS — I10 Essential (primary) hypertension: Secondary | ICD-10-CM

## 2015-02-04 DIAGNOSIS — E039 Hypothyroidism, unspecified: Secondary | ICD-10-CM | POA: Diagnosis not present

## 2015-02-04 DIAGNOSIS — Z125 Encounter for screening for malignant neoplasm of prostate: Secondary | ICD-10-CM

## 2015-02-04 DIAGNOSIS — R739 Hyperglycemia, unspecified: Secondary | ICD-10-CM | POA: Diagnosis not present

## 2015-02-04 LAB — LIPID PANEL
Cholesterol: 203 mg/dL — ABNORMAL HIGH (ref 0–200)
HDL: 49.6 mg/dL (ref >39.00)
LDL Cholesterol: 124 mg/dL — ABNORMAL HIGH (ref 0–99)
NonHDL: 153.39
Total CHOL/HDL Ratio: 4
Triglycerides: 145 mg/dL (ref 0.0–149.0)
VLDL: 29 mg/dL (ref 0.0–40.0)

## 2015-02-04 LAB — COMPREHENSIVE METABOLIC PANEL
ALT: 103 U/L — ABNORMAL HIGH (ref 0–53)
AST: 80 U/L — ABNORMAL HIGH (ref 0–37)
Albumin: 4.7 g/dL (ref 3.5–5.2)
Alkaline Phosphatase: 158 U/L — ABNORMAL HIGH (ref 39–117)
BUN: 14 mg/dL (ref 6–23)
CO2: 29 mEq/L (ref 19–32)
Calcium: 10.1 mg/dL (ref 8.4–10.5)
Chloride: 101 mEq/L (ref 96–112)
Creatinine, Ser: 0.94 mg/dL (ref 0.40–1.50)
GFR: 89.34 mL/min (ref >60.00)
Glucose, Bld: 116 mg/dL — ABNORMAL HIGH (ref 70–99)
Potassium: 4.5 mEq/L (ref 3.5–5.1)
Sodium: 139 mEq/L (ref 135–145)
Total Bilirubin: 0.8 mg/dL (ref 0.2–1.2)
Total Protein: 7.7 g/dL (ref 6.0–8.3)

## 2015-02-04 LAB — TSH: TSH: 0.68 u[IU]/mL (ref 0.35–4.50)

## 2015-02-04 LAB — HEMOGLOBIN A1C: Hgb A1c MFr Bld: 6 % (ref 4.6–6.5)

## 2015-02-04 LAB — PSA: PSA: 0.92 ng/mL (ref 0.10–4.00)

## 2015-02-11 ENCOUNTER — Encounter: Payer: Self-pay | Admitting: Family Medicine

## 2015-02-11 ENCOUNTER — Other Ambulatory Visit: Payer: Self-pay | Admitting: *Deleted

## 2015-02-11 ENCOUNTER — Ambulatory Visit (INDEPENDENT_AMBULATORY_CARE_PROVIDER_SITE_OTHER): Payer: 59 | Admitting: Family Medicine

## 2015-02-11 VITALS — BP 108/78 | HR 67 | Temp 97.8°F | Ht 69.0 in | Wt 186.0 lb

## 2015-02-11 DIAGNOSIS — E7800 Pure hypercholesterolemia, unspecified: Secondary | ICD-10-CM

## 2015-02-11 DIAGNOSIS — Z23 Encounter for immunization: Secondary | ICD-10-CM

## 2015-02-11 DIAGNOSIS — Q2381 Bicuspid aortic valve: Secondary | ICD-10-CM

## 2015-02-11 DIAGNOSIS — E039 Hypothyroidism, unspecified: Secondary | ICD-10-CM

## 2015-02-11 DIAGNOSIS — I10 Essential (primary) hypertension: Secondary | ICD-10-CM

## 2015-02-11 DIAGNOSIS — R739 Hyperglycemia, unspecified: Secondary | ICD-10-CM

## 2015-02-11 DIAGNOSIS — I350 Nonrheumatic aortic (valve) stenosis: Secondary | ICD-10-CM

## 2015-02-11 DIAGNOSIS — Q231 Congenital insufficiency of aortic valve: Secondary | ICD-10-CM

## 2015-02-11 DIAGNOSIS — Z119 Encounter for screening for infectious and parasitic diseases, unspecified: Secondary | ICD-10-CM

## 2015-02-11 DIAGNOSIS — F40243 Fear of flying: Secondary | ICD-10-CM

## 2015-02-11 DIAGNOSIS — Z0000 Encounter for general adult medical examination without abnormal findings: Secondary | ICD-10-CM

## 2015-02-11 DIAGNOSIS — J4520 Mild intermittent asthma, uncomplicated: Secondary | ICD-10-CM

## 2015-02-11 DIAGNOSIS — R945 Abnormal results of liver function studies: Secondary | ICD-10-CM

## 2015-02-11 DIAGNOSIS — Z7189 Other specified counseling: Secondary | ICD-10-CM

## 2015-02-11 DIAGNOSIS — R7989 Other specified abnormal findings of blood chemistry: Secondary | ICD-10-CM

## 2015-02-11 MED ORDER — LEVOTHYROXINE SODIUM 125 MCG PO TABS: ORAL_TABLET | ORAL | Status: DC

## 2015-02-11 MED ORDER — AMLODIPINE BESYLATE 10 MG PO TABS
ORAL_TABLET | ORAL | Status: DC
Start: 2015-02-11 — End: 2016-07-02

## 2015-02-11 MED ORDER — AZELASTINE HCL 0.15 % NA SOLN: NASAL | Status: DC

## 2015-02-11 MED ORDER — FLUTICASONE PROPIONATE 50 MCG/ACT NA SUSP: 1.0000 | Freq: Every day | NASAL | Status: DC | PRN

## 2015-02-11 MED ORDER — ALBUTEROL SULFATE HFA 108 (90 BASE) MCG/ACT IN AERS: 2.0000 | INHALATION_SPRAY | Freq: Four times a day (QID) | RESPIRATORY_TRACT | Status: DC | PRN

## 2015-02-11 NOTE — Progress Notes (Signed)
Pre visit review using our clinic review tool, if applicable. No additional management support is needed unless otherwise documented below in the visit note.  CPE- See plan.  Routine anticipatory guidance given to patient.  See health maintenance. Tetanus 2010 Flu shot 2016 Shingles and PNA shot not due yet. D/w pt.  FH prostate cancer- PSA wnl.  FH colon cancer. Colonoscopy wnl 2014 Living will d/w pt. Wife and his daughter Marye Round would be designated equally.  Diet and exercise d/w pt. Down 30lbs with diet and exercise prev. Intentional weight loss. I thanked him for his effort. He cut out soda. He feels better.   Hypertension:  Using medication without problems: yes, occ lightheaded.  On 10mg  amlodipine.  Brief sx, about 1 time a week.  D/w pt about dosing.   Chest pain with exertion:no Edema:no Short of breath:no  Rare use of SABA, had been off advair.  May only need secondary med in the spring.  He'll check on coverage and update me.   Hypothyroidism. TSH wnl. No neck mass per patient. Compliant with meds.   LFT elevation with h/o fatty liver. Historical weight loss noted. Labs d/w pt.   Bicuspid aortic valve. H/o murmur. Has see cards prev.  Due for f/u echo per cards notes and I sent a note to [name] for input.   He has a fear of flying and is going to potentially need a BZD for flight anxiety. He'll call back when/if needed.   PMH and SH reviewed  Meds, vitals, and allergies reviewed.   ROS: See HPI.  Otherwise negative.    GEN: nad, alert and oriented HEENT: mucous membranes moist NECK: supple w/o LA CV: rrr. Murmur noted.  PULM: ctab, no inc wob ABD: soft, +bs, abd diastasis w/o true hernia noted there.  Incidental umbilical hernia noted.   EXT: no edema SKIN: no acute rash

## 2015-02-11 NOTE — Patient Instructions (Addendum)
Since you haven't needed the advair (except potentially in the spring), check with your new insurance to see what is covered.  Plain fluticasone inhaler may be covered and may be cheaper.  Let me know.  If you don't hear anything from cardiology next week, then let me know.  Cut back to 5mg  amlodipine and see if that helps.  Update me as needed.  Go to the lab on the way out.  We'll contact you with your lab report. Take care.  Glad to see you.

## 2015-02-12 ENCOUNTER — Telehealth: Payer: Self-pay | Admitting: *Deleted

## 2015-02-12 DIAGNOSIS — Q2381 Bicuspid aortic valve: Secondary | ICD-10-CM

## 2015-02-12 DIAGNOSIS — Q231 Congenital insufficiency of aortic valve: Secondary | ICD-10-CM

## 2015-02-12 DIAGNOSIS — Z7189 Other specified counseling: Secondary | ICD-10-CM | POA: Insufficient documentation

## 2015-02-12 LAB — HEPATITIS C ANTIBODY: HCV Ab: NEGATIVE

## 2015-02-12 LAB — HEPATITIS B SURFACE ANTIGEN: Hepatitis B Surface Ag: NEGATIVE

## 2015-02-12 LAB — HEPATITIS B SURFACE ANTIBODY, QUANTITATIVE: Hepatitis B-Post: 0 m[IU]/mL

## 2015-02-12 LAB — HIV ANTIBODY (ROUTINE TESTING W REFLEX): HIV 1&2 Ab, 4th Generation: NONREACTIVE

## 2015-02-12 NOTE — Assessment & Plan Note (Signed)
He'll check on coverage.  As well as he did, he may not need advair and may only need fluticasone in the spring with prn SABA.  He'll check on coverage.

## 2015-02-12 NOTE — Assessment & Plan Note (Signed)
Will check with cards for input.

## 2015-02-12 NOTE — Assessment & Plan Note (Signed)
tsh wnl, continue as is.   

## 2015-02-12 NOTE — Assessment & Plan Note (Signed)
Can rx prn bzd later on when needed.

## 2015-02-12 NOTE — Assessment & Plan Note (Signed)
Needs work on diet and exercise d/w pt.

## 2015-02-12 NOTE — Assessment & Plan Note (Signed)
Tetanus 2010 Flu shot 2016 Shingles and PNA shot not due yet. D/w pt.  FH prostate cancer- PSA wnl.  FH colon cancer. Colonoscopy wnl 2014 Living will d/w pt. Wife and his daughter Marye Round would be designated equally.  Diet and exercise d/w pt. Down 30lbs with diet and exercise prev. Intentional weight loss. I thanked him for his effort. He cut out soda. He feels better.  Pt opts in for HCV and HIV screening.  D/w pt re: routine screening.

## 2015-02-12 NOTE — Assessment & Plan Note (Signed)
Needs work on diet and exercise, d/w pt.

## 2015-02-12 NOTE — Telephone Encounter (Signed)
PA sent to [facility] thru [facility] for Azelastine HCL.  Denial received.  Placed in Dr. Buckner [place].

## 2015-02-12 NOTE — Assessment & Plan Note (Signed)
He'll cut back to 5mg  amlodipine and see if that helps. Update me as needed.  He may need 7.5mg  a day, depending on lightheadedness and his BP.  D/w pt.  He'll update me.

## 2015-02-12 NOTE — Assessment & Plan Note (Signed)
Known fatty liver, check hep labs, d/w pt.  Continue work on weight loss.

## 2015-02-13 MED ORDER — AZELASTINE HCL 0.1 % NA SOLN: 2.0000 | Freq: Two times a day (BID) | NASAL | Status: DC

## 2015-02-13 NOTE — Telephone Encounter (Addendum)
Patient notified that new script has been sent in per Dr. Damita Dunnings.  Patient stated that he has not heard anything from cardiology about scheduling the f/u echo. Advised patient that this note will be forwarded to cardiology and let them know that he is waiting on a call from them to get this scheduled.

## 2015-02-13 NOTE — Addendum Note (Signed)
Addended by: [name] on: 02/13/2015 10:52 AM   Modules accepted: Orders, Medications

## 2015-02-13 NOTE — Telephone Encounter (Addendum)
Per paperwork, it looks like it needs to be resent as generic astelin. I sent it.  We'll see.  Also, patient is due for f/u echo.  It looks like cardiology already put in the order.

## 2015-02-22 ENCOUNTER — Ambulatory Visit (HOSPITAL_COMMUNITY): Payer: 59 | Attending: Cardiology

## 2015-02-22 ENCOUNTER — Other Ambulatory Visit: Payer: Self-pay

## 2015-02-22 DIAGNOSIS — I071 Rheumatic tricuspid insufficiency: Secondary | ICD-10-CM | POA: Insufficient documentation

## 2015-02-22 DIAGNOSIS — Q231 Congenital insufficiency of aortic valve: Secondary | ICD-10-CM | POA: Diagnosis not present

## 2015-02-22 DIAGNOSIS — I517 Cardiomegaly: Secondary | ICD-10-CM | POA: Insufficient documentation

## 2015-02-22 DIAGNOSIS — E119 Type 2 diabetes mellitus without complications: Secondary | ICD-10-CM | POA: Insufficient documentation

## 2015-02-22 DIAGNOSIS — I340 Nonrheumatic mitral (valve) insufficiency: Secondary | ICD-10-CM | POA: Diagnosis not present

## 2015-02-22 DIAGNOSIS — I350 Nonrheumatic aortic (valve) stenosis: Secondary | ICD-10-CM | POA: Diagnosis not present

## 2015-02-22 DIAGNOSIS — I77810 Thoracic aortic ectasia: Secondary | ICD-10-CM | POA: Diagnosis not present

## 2015-09-09 ENCOUNTER — Ambulatory Visit (INDEPENDENT_AMBULATORY_CARE_PROVIDER_SITE_OTHER): Payer: No Typology Code available for payment source | Admitting: Pulmonary Disease

## 2015-09-09 ENCOUNTER — Encounter: Payer: Self-pay | Admitting: Pulmonary Disease

## 2015-09-09 VITALS — BP 138/92 | HR 66 | Ht 69.0 in | Wt 195.2 lb

## 2015-09-09 DIAGNOSIS — G4733 Obstructive sleep apnea (adult) (pediatric): Secondary | ICD-10-CM | POA: Diagnosis not present

## 2015-09-09 NOTE — Patient Instructions (Signed)
Follow up in 1 year.

## 2015-09-09 NOTE — Progress Notes (Signed)
Current Outpatient Prescriptions on File Prior to Visit  Medication Sig  . albuterol (PROVENTIL HFA;VENTOLIN HFA) 108 (90 BASE) MCG/ACT inhaler Inhale 2 puffs into the lungs every 6 (six) hours as needed.  [name] amLODipine (NORVASC) 10 MG tablet TAKE 0.5-1 TABLETS (5-10 MG TOTAL) BY MOUTH DAILY.  [name] azelastine (ASTELIN) 0.1 % nasal spray Place 2 sprays into both nostrils 2 (two) times daily. Use in each nostril as directed  . fish oil-omega-3 fatty acids 1000 MG capsule Take by mouth daily. Pt doesn't know dosage  . fluticasone (FLONASE) 50 MCG/ACT nasal spray Place 1-2 sprays into both nostrils daily as needed.  [name] levothyroxine (SYNTHROID, LEVOTHROID) 125 MCG tablet TAKE 1 TABLET (125 MCG TOTAL) BY MOUTH DAILY BEFORE BREAKFAST.  [name] omeprazole (PRILOSEC) 20 MG capsule Take 20 mg by mouth daily.   No current facility-administered medications on file prior to visit.    Chief Complaint  Patient presents with  . Follow-up    Wears CPAP nightly. Denies problems with mask/pressure. DME: [facility]    Sleep tests PSG 11/08/11>>AHI 31.6, SpO2 low 85%. Auto CPAP 08/22/13 to 09/20/13 >> used on 29 of 30 nights with average 7 hrs and 30 min.  Average AHI is 1.7 with median CPAP 7 cm H2O and 95 th percentile CPAP 10 cm H20  Cardiac tests Echo 02/22/15 >> EF 60 to 65%, mild LVH, grade 1 diastolic dysfx, biscuspid aortic valve  PMHx >> HTN, Hypothyroidism, GERD, Bicuspid aortic valve  PSHx, Medications, Allergies, Fhx, Shx reviewed.  History of Present Illness: [name] is a 54 y.o. male with severe OSA.  I last saw him in 2015.  He has been doing well with CPAP.  No issues with mask fit.  Sleeps through the night.   Physical Exam:  General - No distress ENT - No sinus tenderness, no oral exudate, no LAN, MP 3, nasal septal deviation  Cardiac - s1s2 regular, 2/6 SM Chest - No wheeze/rales/dullness Back - No focal tenderness Abd - Soft, non-tender Ext - No edema Neuro - Normal strength Skin - No  rashes Psych - normal mood, and behavior   Assessment/Plan:  Obstructive sleep apnea. - continue auto CPAP   Patient Instructions  Follow up in 1 year    Chesley Mires, MD [facility]/Critical Care/Sleep Pager:  [phone] 09/09/2015, 5:12 PM

## 2016-03-12 ENCOUNTER — Other Ambulatory Visit: Payer: Self-pay | Admitting: Family Medicine

## 2016-03-13 ENCOUNTER — Encounter: Payer: Self-pay | Admitting: *Deleted

## 2016-04-09 ENCOUNTER — Encounter: Payer: Self-pay | Admitting: Family Medicine

## 2016-04-09 ENCOUNTER — Ambulatory Visit (INDEPENDENT_AMBULATORY_CARE_PROVIDER_SITE_OTHER): Payer: BLUE CROSS/BLUE SHIELD | Admitting: Family Medicine

## 2016-04-09 ENCOUNTER — Ambulatory Visit (INDEPENDENT_AMBULATORY_CARE_PROVIDER_SITE_OTHER)
Admission: RE | Admit: 2016-04-09 | Discharge: 2016-04-09 | Disposition: A | Discharge status: Home / Self Care | Payer: BLUE CROSS/BLUE SHIELD | Source: Ambulatory Visit | Attending: Family Medicine | Admitting: Family Medicine

## 2016-04-09 VITALS — BP 128/80 | HR 62 | Temp 98.4°F | Wt 191.8 lb

## 2016-04-09 DIAGNOSIS — R05 Cough: Secondary | ICD-10-CM

## 2016-04-09 DIAGNOSIS — R7989 Other specified abnormal findings of blood chemistry: Secondary | ICD-10-CM | POA: Diagnosis not present

## 2016-04-09 DIAGNOSIS — J45909 Unspecified asthma, uncomplicated: Secondary | ICD-10-CM

## 2016-04-09 DIAGNOSIS — R059 Cough, unspecified: Secondary | ICD-10-CM

## 2016-04-09 DIAGNOSIS — R17 Unspecified jaundice: Secondary | ICD-10-CM | POA: Diagnosis not present

## 2016-04-09 DIAGNOSIS — R945 Abnormal results of liver function studies: Secondary | ICD-10-CM

## 2016-04-09 LAB — CBC WITH DIFFERENTIAL/PLATELET
Basophils Absolute: 0.1 10*3/uL (ref 0.0–0.1)
Basophils Relative: 1.4 % (ref 0.0–3.0)
Eosinophils Absolute: 0.3 10*3/uL (ref 0.0–0.7)
Eosinophils Relative: 3.3 % (ref 0.0–5.0)
HCT: 42.3 % (ref 39.0–52.0)
Hemoglobin: 14.5 g/dL (ref 13.0–17.0)
Lymphocytes Relative: 24.8 % (ref 12.0–46.0)
Lymphs Abs: 2.1 10*3/uL (ref 0.7–4.0)
MCHC: 34.3 g/dL (ref 30.0–36.0)
MCV: 90 fl (ref 78.0–100.0)
Monocytes Absolute: 1 10*3/uL (ref 0.1–1.0)
Monocytes Relative: 11.6 % (ref 3.0–12.0)
Neutro Abs: 4.9 10*3/uL (ref 1.4–7.7)
Neutrophils Relative %: 58.9 % (ref 43.0–77.0)
Platelets: 244 10*3/uL (ref 150.0–400.0)
RBC: 4.7 Mil/uL (ref 4.22–5.81)
RDW: 16.4 % — ABNORMAL HIGH (ref 11.5–15.5)
WBC: 8.3 10*3/uL (ref 4.0–10.5)

## 2016-04-09 LAB — COMPREHENSIVE METABOLIC PANEL
ALT: 86 U/L — ABNORMAL HIGH (ref 0–53)
AST: 103 U/L — ABNORMAL HIGH (ref 0–37)
Albumin: 0 g/dL — ABNORMAL LOW (ref 3.5–5.2)
Alkaline Phosphatase: 253 U/L — ABNORMAL HIGH (ref 39–117)
BUN: 16 mg/dL (ref 6–23)
CO2: 27 mEq/L (ref 19–32)
Calcium: 9 mg/dL (ref 8.4–10.5)
Chloride: 101 mEq/L (ref 96–112)
Creatinine, Ser: 0.77 mg/dL (ref 0.40–1.50)
GFR: 111.96 mL/min (ref >60.00)
Glucose, Bld: 92 mg/dL (ref 70–99)
Potassium: 4 mEq/L (ref 3.5–5.1)
Sodium: 134 mEq/L — ABNORMAL LOW (ref 135–145)
Total Bilirubin: 5.1 mg/dL — ABNORMAL HIGH (ref 0.2–1.2)
Total Protein: 8 g/dL (ref 6.0–8.3)

## 2016-04-09 MED ORDER — FLUTICASONE PROPIONATE HFA 110 MCG/ACT IN AERO: 2.0000 | INHALATION_SPRAY | Freq: Two times a day (BID) | RESPIRATORY_TRACT | 12 refills | Status: DC

## 2016-04-09 NOTE — Progress Notes (Signed)
Pre visit review using our clinic review tool, if applicable. No additional management support is needed unless otherwise documented below in the visit note. 

## 2016-04-09 NOTE — Patient Instructions (Signed)
Go to the lab on the way out.  We'll contact you with your lab and xray report. Add on fluticasone inhaler, rinse after [place].  Update me in about 1 week, sooner if needed.  Take care.  Glad to see you.

## 2016-04-09 NOTE — Progress Notes (Signed)
More need for SABA over the last few months.  This is the atypical time of the year for patient to need the medication.  When used, it helps some.  Wheeze, cough.  "I'll get that little cough and then I'll need the inhaler."  In the last few days, he has some cold sx, some aches and fatigue.  No fevers.  tmax 99.1  He feels some better today, clearly better than since 04/04/16.    He noted some jaundice recently over the last month or so, eyes slightly yellow recently.  Known fatty liver.  No vomiting, no diarrhea, but some loose stools recently.  No consistent/sig abd pain, he may have had some twinges of abd pain recently. Minimal etoh- 1-2 drinks some weeks, not weekly.  No Tylenol use. He has noted some easier bruising  No triggers for the new sx over the last few months.    Meds, vitals, and allergies reviewed.   ROS: Per HPI unless specifically indicated in ROS section   GEN: nad, alert and oriented HEENT: mucous membranes moist, tm w/o erythema, nasal exam w/o erythema, clear discharge noted,  OP with cobblestoning NECK: supple w/o LA CV: rrr.   PULM: ctab, no inc wob EXT: no edema SKIN: no acute rash but jaundice noted on the arms >trunk and whites of eyes slightly yellow B

## 2016-04-10 ENCOUNTER — Other Ambulatory Visit: Payer: Self-pay | Admitting: Family Medicine

## 2016-04-10 DIAGNOSIS — R945 Abnormal results of liver function studies: Principal | ICD-10-CM

## 2016-04-10 DIAGNOSIS — R7989 Other specified abnormal findings of blood chemistry: Secondary | ICD-10-CM

## 2016-04-10 LAB — HEPATITIS PANEL, ACUTE
HCV Ab: NEGATIVE
Hep A IgM: NONREACTIVE
Hep B C IgM: NONREACTIVE
Hepatitis B Surface Ag: NEGATIVE

## 2016-04-10 NOTE — Assessment & Plan Note (Signed)
He has needed his short acting beta agonist more often recently. This is an atypical time of year for him to need that medication. Lungs are clear today on exam. Check chest x-ray. Add on Flovent. He will update me if not better. At this point still okay for outpatient follow-up. >25 minutes spent in face to face time with patient, >50% spent in counselling or coordination of care.

## 2016-04-10 NOTE — Assessment & Plan Note (Signed)
History of LFT elevation. Now with jaundice. Known history of fatty liver disease. Recheck labs today. Check acute hepatitis panel. At this point still okay for outpatient follow-up. Avoid alcohol. Avoid Tylenol.

## 2016-04-13 ENCOUNTER — Ambulatory Visit
Admission: RE | Admit: 2016-04-13 | Discharge: 2016-04-13 | Disposition: A | Discharge status: Home / Self Care | Payer: BLUE CROSS/BLUE SHIELD | Source: Ambulatory Visit | Attending: Family Medicine | Admitting: Family Medicine

## 2016-04-13 DIAGNOSIS — R945 Abnormal results of liver function studies: Principal | ICD-10-CM

## 2016-04-13 DIAGNOSIS — R7989 Other specified abnormal findings of blood chemistry: Secondary | ICD-10-CM

## 2016-04-14 ENCOUNTER — Other Ambulatory Visit: Payer: Self-pay | Admitting: Family Medicine

## 2016-04-14 DIAGNOSIS — R932 Abnormal findings on diagnostic imaging of liver and biliary tract: Secondary | ICD-10-CM

## 2016-04-14 MED ORDER — DIAZEPAM 5 MG PO TABS: 2.5000 mg | ORAL_TABLET | Freq: Once | ORAL | 0 refills | Status: DC

## 2016-04-14 NOTE — Progress Notes (Signed)
Patient needed valium for MRI due to h/o claustrophobia.  Needs to be called in.  Patient will need driver for the imaging if he takes the medicine.

## 2016-04-14 NOTE — Progress Notes (Signed)
Rx called to pharmacy as instructed. Called and advised patient as instructed and verbalized understanding.  Patient stated that he is real constipated and wants to know what he should do since he can not eat or drink anything? Patient stated is last BM was yesterday.

## 2016-04-14 NOTE — Progress Notes (Signed)
Patient notified as instructed by telephone and verbalized understanding. 

## 2016-04-14 NOTE — Progress Notes (Signed)
Can take otc miralax 17g qd-bid if needed.  If he is going for the MRI today, I wouldn't take until after the MRI is done.  Thanks.

## 2016-04-15 ENCOUNTER — Ambulatory Visit (HOSPITAL_COMMUNITY)
Admission: RE | Admit: 2016-04-15 | Discharge: 2016-04-15 | Disposition: A | Discharge status: Home / Self Care | Payer: BLUE CROSS/BLUE SHIELD | Source: Ambulatory Visit | Attending: Family Medicine | Admitting: Family Medicine

## 2016-04-15 DIAGNOSIS — R932 Abnormal findings on diagnostic imaging of liver and biliary tract: Secondary | ICD-10-CM

## 2016-04-15 DIAGNOSIS — I8500 Esophageal varices without bleeding: Secondary | ICD-10-CM | POA: Insufficient documentation

## 2016-04-15 DIAGNOSIS — R945 Abnormal results of liver function studies: Secondary | ICD-10-CM | POA: Diagnosis not present

## 2016-04-15 DIAGNOSIS — K7460 Unspecified cirrhosis of liver: Secondary | ICD-10-CM | POA: Diagnosis not present

## 2016-04-15 DIAGNOSIS — R161 Splenomegaly, not elsewhere classified: Secondary | ICD-10-CM | POA: Insufficient documentation

## 2016-04-15 DIAGNOSIS — K766 Portal hypertension: Secondary | ICD-10-CM | POA: Diagnosis not present

## 2016-04-15 MED ORDER — GADOBENATE DIMEGLUMINE 529 MG/ML IV SOLN
20.0000 mL | Freq: Once | INTRAVENOUS | Status: AC | PRN
  Administered 2016-04-15: 20 mL via INTRAVENOUS

## 2016-04-16 ENCOUNTER — Encounter: Payer: Self-pay | Admitting: Nurse Practitioner

## 2016-04-16 ENCOUNTER — Ambulatory Visit (INDEPENDENT_AMBULATORY_CARE_PROVIDER_SITE_OTHER): Payer: BLUE CROSS/BLUE SHIELD | Admitting: Nurse Practitioner

## 2016-04-16 ENCOUNTER — Other Ambulatory Visit (INDEPENDENT_AMBULATORY_CARE_PROVIDER_SITE_OTHER): Payer: BLUE CROSS/BLUE SHIELD

## 2016-04-16 VITALS — BP 104/78 | HR 68 | Ht 68.0 in | Wt 187.0 lb

## 2016-04-16 DIAGNOSIS — K7460 Unspecified cirrhosis of liver: Secondary | ICD-10-CM | POA: Diagnosis not present

## 2016-04-16 LAB — FERRITIN: Ferritin: 753.3 ng/mL — ABNORMAL HIGH (ref 22.0–322.0)

## 2016-04-16 LAB — PROTIME-INR
INR: 1.2 ratio — ABNORMAL HIGH (ref 0.8–1.0)
Prothrombin Time: 12.9 s (ref 9.6–13.1)

## 2016-04-16 NOTE — Patient Instructions (Addendum)
You have been scheduled for an endoscopy. Please follow written instructions given to you at your visit today. If you use inhalers (even only as needed), please bring them with you on the day of your procedure. Your physician has requested that you go to www.[email or web address].[email or web address] and enter the access code given to you at your visit today. This web site gives a general overview about your procedure. However, you should still follow specific instructions given to you by our office regarding your preparation for the procedure.  Your physician has requested that you go to the basement for the following lab work before leaving today: ANA, ASMA, FERRITIN, Alpha 1 antitripsyn, INR, AMA, IGG  If you are age 42 or older, your body mass index should be between 23-30. Your Body mass index is 28.43 kg/m. If this is out of the aforementioned range listed, please consider follow up with your Primary Care Provider.  If you are age 25 or younger, your body mass index should be between 19-25. Your Body mass index is 28.43 kg/m. If this is out of the aformentioned range listed, please consider follow up with your Primary Care Provider.

## 2016-04-16 NOTE — Progress Notes (Addendum)
    HPI: Patient is a 56-year-old male referred by PCP Dr. Duncan Graham for evaluation of jaundice. Patient has chronic transaminitis dating back to 2008, predominantly elevation of ALT . Now with new cholestasis and fatigue. No abdominal pain or weight loss. He does complain of generalized itching. HCV ab negative, hepatitis B surface antigen and core antibody negative. Right upper quadrant ultrasound reveals gallbladder polyps, gallbladder wall thickening and probable adenomyomatosis. Subtle liver contour abnormalities raising possibility of cirrhosis. MRI yesterday confirms cirrhosis with portal hypertension including splenomegaly and mild esophageal varices.   Patient drank between ages [age] but mainly on the weekends. Since then he averages only 4 drinks a month. He thinks sister had "immune liver problems".    Past Medical History:  Diagnosis Date  . Allergy   . Bicuspid aortic valve   . Fatty liver    with h/o elevated LFT's  . Heart murmur   . Hypertension   . OSA (obstructive sleep apnea) 09/14/2011   PSG 11/08/11>>AHI 31.6, SpO2 low 85%. wears CPAP  . Other abnormal glucose    diet controlled diabetic  . Thyroid disease     Past Surgical History:  Procedure Laterality Date  . APPENDECTOMY  10/09   Emergency  . BIOPSY THYROID  08/19/07   Attempted, no tissue obtained  . CARDIOVASCULAR STRESS TEST  03/07   Negative 06/05  . DOPPLER ECHOCARDIOGRAPHY  06/04   Family History  Problem Relation Age of Onset  . Asthma Mother   . Cancer Father     Died when pt was 14 of CA with mets, site unknown, COPD  . COPD Father   . Heart disease Sister     Heart stopped  . Prostate cancer Maternal Uncle   . Colon cancer Maternal Grandmother   . Heart disease Maternal Grandfather     MI, 60 YOA  . Esophageal cancer Neg Hx   . Rectal cancer Neg Hx   . Stomach cancer Neg Hx    Social History  Substance Use Topics  . Smoking status: Never Smoker  . Smokeless tobacco: Never  Used  . Alcohol use No     Comment: rare   Current Outpatient Prescriptions  Medication Sig Dispense Refill  . albuterol (PROVENTIL HFA;VENTOLIN HFA) 108 (90 BASE) MCG/ACT inhaler Inhale 2 puffs into the lungs every 6 (six) hours as needed. 18 g 12  . amLODipine (NORVASC) 10 MG tablet TAKE 0.5-1 TABLETS (5-10 MG TOTAL) BY MOUTH DAILY. 90 tablet 3  . azelastine (ASTELIN) 0.1 % nasal spray Place 2 sprays into both nostrils 2 (two) times daily. Use in each nostril as directed 30 mL 12  . fish oil-omega-3 fatty acids 1000 MG capsule Take by mouth daily. Pt doesn't know dosage    . fluticasone (FLONASE) 50 MCG/ACT nasal spray Place 1-2 sprays into both nostrils daily as needed. 48 g 3  . fluticasone (FLOVENT HFA) 110 MCG/ACT inhaler Inhale 2 puffs into the lungs 2 (two) times daily. Rinse after use. 1 Inhaler 12  . levothyroxine (SYNTHROID, LEVOTHROID) 125 MCG tablet Take one tablet by mouth every morning 30 minutes before breakfast. *Needs an appointment with labs prior for additional refills* 30 tablet 0  . omeprazole (PRILOSEC) 20 MG capsule Take 20 mg by mouth daily.     No current facility-administered medications for this visit.    No Known Allergies   Review of Systems: All systems reviewed and negative except where noted in HPI.    Physical Exam:   BP 104/78   Pulse 68   Ht 5' 8" (1.727 m)   Wt 187 lb (84.8 kg)   BMI 28.43 kg/m  Constitutional:  Well-developed, white male in no acute distress. Psychiatric: Normal mood and affect. Behavior is normal. EENT:  Pupils round, mildly icteric sclerae. Neck supple.  Cardiovascular: Normal rate, regular rhythm.  Pulmonary/chest: Effort normal and breath sounds normal. No wheezing, rales or rhonchi. Abdominal: Soft, nondistended, nontender. Bowel sounds active throughout. There are no masses palpable. No hepatomegaly. Extremities: no edema. No palmar erythema.  Lymphadenopathy: No cervical adenopathy noted. Neurological: Alert and  oriented to person place and time. Musculoskeletal : normal tone and strength. No temporal wasting.  Skin: Skin is warm and dry. No rashes noted. May have a few small angiomas on chest though they don't blanch as expected.    ASSESSMENT AND PLAN:   56 yo male with chronic transaminitis, now with new cholestasis and finding of cirrhosis with portal hypertension on MRI. No history of heavy ETOH. Known fatty liver since at least 2002 so NASH possible but seems less likely based on degree of ALT elevation through the years. Autoimmune disease?  -Needs liver labs to determine etiology of cirrhosis. Recent HBsAg negative, HCV negative. Will obtain ferritin, ANA, ASMA, IgG, AMA, alpha 1 antitrypsin. Given age, Wilson's unlikely so no need for ceruloplasmin.  -He will need and EGD for varices screening. MRI characterizes them as small -eventual HAV, HBV vaccinations -obtain prothrombin time -no ETOH going forward -will notify patient of lab results as they become available.   2. Abnormal gallbladder on ultrasound -  Polyps measuring 6.6mm, GB mildly thickened, probable adenomyomatosis.   3. Colon cancer screening. No polyps on colonoscopy by Dr. Jacobs Aug 2017  Addendum: I discussed case today with Dr. Gessner. He agrees it is probably best to do EGD at hospital to allow option of variceal banding. I inadvertently scheduled procedure with Dr. Gessner who was fine with leaving the patient on his schedule. I will forward this to Dr. Jacobs   Paula Guenther, NP  04/16/2016, 11:33 AM  Cc: Duncan, Graham S, MD Agree with Ms. Guenther's assessment and plan. Carl E. Gessner, MD, FACG   

## 2016-04-17 LAB — IGG: IgG (Immunoglobin G), Serum: 1859 mg/dL — ABNORMAL HIGH (ref 694–1618)

## 2016-04-17 LAB — ANA: Anti Nuclear Antibody(ANA): NEGATIVE

## 2016-04-17 LAB — ANTI-SMOOTH MUSCLE ANTIBODY, IGG: Smooth Muscle Ab: <20 U (ref <20)

## 2016-04-17 LAB — MITOCHONDRIAL ANTIBODIES: Mitochondrial M2 Ab, IgG: 26.7 Units — ABNORMAL HIGH (ref <=20.0)

## 2016-04-20 ENCOUNTER — Other Ambulatory Visit: Payer: Self-pay | Admitting: Family Medicine

## 2016-04-20 DIAGNOSIS — E039 Hypothyroidism, unspecified: Secondary | ICD-10-CM

## 2016-04-20 LAB — ALPHA-1-ANTITRYPSIN: A-1 Antitrypsin, Ser: 173 mg/dL (ref 83–199)

## 2016-04-20 NOTE — Progress Notes (Signed)
Ok, I agree with the above note, plan.

## 2016-04-20 NOTE — Telephone Encounter (Signed)
Received refill electronically Last office visit 04/09/16/acute Last TSH 02/04/15 Okay to refill?

## 2016-04-21 NOTE — Telephone Encounter (Signed)
Okay to continue.  Thanks.  Sent.   TSH ordered for next set of labs, to be done at his convenience, okay to do later on in 2018.

## 2016-04-21 NOTE — Telephone Encounter (Signed)
Left message on voicemail for patient to call back. 

## 2016-04-22 NOTE — Telephone Encounter (Signed)
Patient notified as instructed by telephone and verbalized understanding. Patient stated that he will call back later and schedule lab work.

## 2016-04-29 ENCOUNTER — Encounter (HOSPITAL_COMMUNITY): Payer: Self-pay | Admitting: *Deleted

## 2016-05-04 ENCOUNTER — Ambulatory Visit (HOSPITAL_COMMUNITY): Payer: BLUE CROSS/BLUE SHIELD | Admitting: Certified Registered Nurse Anesthetist

## 2016-05-04 ENCOUNTER — Telehealth: Payer: Self-pay

## 2016-05-04 ENCOUNTER — Ambulatory Visit (HOSPITAL_COMMUNITY)
Admission: RE | Admit: 2016-05-04 | Discharge: 2016-05-04 | Disposition: A | Discharge status: Home / Self Care | Payer: BLUE CROSS/BLUE SHIELD | Source: Ambulatory Visit | Attending: Internal Medicine | Admitting: Internal Medicine

## 2016-05-04 ENCOUNTER — Telehealth: Payer: Self-pay | Admitting: Internal Medicine

## 2016-05-04 ENCOUNTER — Encounter (HOSPITAL_COMMUNITY): Payer: Self-pay

## 2016-05-04 ENCOUNTER — Encounter (HOSPITAL_COMMUNITY)
Admission: RE | Disposition: A | Discharge status: Home / Self Care | Payer: Self-pay | Source: Ambulatory Visit | Attending: Internal Medicine

## 2016-05-04 DIAGNOSIS — Z7982 Long term (current) use of aspirin: Secondary | ICD-10-CM | POA: Diagnosis not present

## 2016-05-04 DIAGNOSIS — K766 Portal hypertension: Secondary | ICD-10-CM | POA: Diagnosis not present

## 2016-05-04 DIAGNOSIS — K824 Cholesterolosis of gallbladder: Secondary | ICD-10-CM | POA: Insufficient documentation

## 2016-05-04 DIAGNOSIS — G4733 Obstructive sleep apnea (adult) (pediatric): Secondary | ICD-10-CM | POA: Insufficient documentation

## 2016-05-04 DIAGNOSIS — K760 Fatty (change of) liver, not elsewhere classified: Secondary | ICD-10-CM | POA: Diagnosis not present

## 2016-05-04 DIAGNOSIS — K7460 Unspecified cirrhosis of liver: Secondary | ICD-10-CM | POA: Diagnosis not present

## 2016-05-04 DIAGNOSIS — Z79899 Other long term (current) drug therapy: Secondary | ICD-10-CM | POA: Diagnosis not present

## 2016-05-04 DIAGNOSIS — I10 Essential (primary) hypertension: Secondary | ICD-10-CM | POA: Diagnosis not present

## 2016-05-04 DIAGNOSIS — L299 Pruritus, unspecified: Secondary | ICD-10-CM | POA: Insufficient documentation

## 2016-05-04 DIAGNOSIS — K219 Gastro-esophageal reflux disease without esophagitis: Secondary | ICD-10-CM | POA: Insufficient documentation

## 2016-05-04 DIAGNOSIS — K7469 Other cirrhosis of liver: Secondary | ICD-10-CM

## 2016-05-04 DIAGNOSIS — E039 Hypothyroidism, unspecified: Secondary | ICD-10-CM | POA: Diagnosis not present

## 2016-05-04 DIAGNOSIS — K3189 Other diseases of stomach and duodenum: Secondary | ICD-10-CM

## 2016-05-04 DIAGNOSIS — R17 Unspecified jaundice: Secondary | ICD-10-CM

## 2016-05-04 HISTORY — DX: Gastro-esophageal reflux disease without esophagitis: K21.9

## 2016-05-04 HISTORY — PX: ESOPHAGOGASTRODUODENOSCOPY (EGD) WITH PROPOFOL: SHX5813

## 2016-05-04 HISTORY — PX: ESOPHAGEAL BANDING: SHX5518

## 2016-05-04 HISTORY — DX: Pruritus, unspecified: L29.9

## 2016-05-04 HISTORY — DX: Unspecified asthma, uncomplicated: J45.909

## 2016-05-04 HISTORY — DX: Unspecified jaundice: R17

## 2016-05-04 SURGERY — ESOPHAGOGASTRODUODENOSCOPY (EGD) WITH PROPOFOL
Anesthesia: Monitor Anesthesia Care

## 2016-05-04 MED ORDER — LIDOCAINE 2% (20 MG/ML) 5 ML SYRINGE
INTRAMUSCULAR | Status: AC
  Filled 2016-05-04: qty 5

## 2016-05-04 MED ORDER — ONDANSETRON HCL 4 MG/2ML IJ SOLN
INTRAMUSCULAR | Status: AC
  Filled 2016-05-04: qty 2

## 2016-05-04 MED ORDER — PROPOFOL 500 MG/50ML IV EMUL
INTRAVENOUS | Status: DC | PRN
  Administered 2016-05-04: 150 ug/kg/min via INTRAVENOUS

## 2016-05-04 MED ORDER — LIDOCAINE 2% (20 MG/ML) 5 ML SYRINGE
INTRAMUSCULAR | Status: DC | PRN
  Administered 2016-05-04: 100 mg via INTRAVENOUS

## 2016-05-04 MED ORDER — ONDANSETRON HCL 4 MG/2ML IJ SOLN
INTRAMUSCULAR | Status: DC | PRN
  Administered 2016-05-04: 4 mg via INTRAVENOUS

## 2016-05-04 MED ORDER — PROPOFOL 10 MG/ML IV BOLUS
INTRAVENOUS | Status: AC
  Filled 2016-05-04: qty 40

## 2016-05-04 MED ORDER — SODIUM CHLORIDE 0.9 % IV SOLN: INTRAVENOUS | Status: DC

## 2016-05-04 MED ORDER — CHOLESTYRAMINE LIGHT 4 G PO PACK: 4.0000 g | PACK | Freq: Two times a day (BID) | ORAL | 2 refills | Status: DC

## 2016-05-04 MED ORDER — LACTATED RINGERS IV SOLN
INTRAVENOUS | Status: DC
  Administered 2016-05-04: 1000 mL via INTRAVENOUS

## 2016-05-04 SURGICAL SUPPLY — 14 items

## 2016-05-04 NOTE — Discharge Instructions (Signed)
YOU HAD AN ENDOSCOPIC PROCEDURE TODAY: Refer to the procedure report and other information in the discharge instructions given to you for any specific questions about what was found during the examination. If this information does not answer your questions, please call [place] office at [phone] to clarify.   YOU SHOULD EXPECT: Some feelings of bloating in the abdomen. Passage of more gas than usual. Walking can help get rid of the air that was put into your GI tract during the procedure and reduce the bloating. I Some abdominal soreness may be present for a day or two, also.  DIET: Your first meal following the procedure should be a light meal and then it is ok to progress to your normal diet. A half-sandwich or bowl of soup is an example of a good first meal. Heavy or fried foods are harder to digest and may make you feel nauseous or bloated. Drink plenty of fluids but you should avoid alcoholic beverages for 24 hours. If you had a esophageal dilation, please see attached instructions for diet.    ACTIVITY: Your care partner should take you home directly after the procedure. You should plan to take it easy, moving slowly for the rest of the day. You can resume normal activity the day after the procedure however YOU SHOULD NOT DRIVE, use power tools, machinery or perform tasks that involve climbing or major physical exertion for 24 hours (because of the sedation medicines used during the test).   SYMPTOMS TO REPORT IMMEDIATELY: A gastroenterologist can be reached at any hour. Please call [phone]  for any of the following symptoms:   Following upper endoscopy (EGD, EUS, ERCP, esophageal dilation) Vomiting of blood or coffee ground material  New, significant abdominal pain  New, significant chest pain or pain under the shoulder blades  Painful or persistently difficult swallowing  New shortness of breath  Black, tarry-looking or red, bloody stools  FOLLOW UP:  If any biopsies were taken  you will be contacted by phone or by letter within the next 1-3 weeks. Call [phone]  if you have not heard about the biopsies in 3 weeks.  Please also call with any specific questions about appointments or follow up tests.

## 2016-05-04 NOTE — Telephone Encounter (Signed)
I called [facility] and left detailed message(they closed early due to the snow) to cancel patients cholestyramine and  I resent it to [facility] as he requested.

## 2016-05-04 NOTE — Op Note (Signed)
[facility] Patient Name: [name] Procedure Date: 05/04/2016 MRN: [id number] Attending MD: [name] , MD Date of Birth: 01-Jul-1960 CSN: [id number] Age: 56 Admit Type: Outpatient Procedure:                Upper GI endoscopy Indications:              Cirrhosis rule out esophageal varices Providers:                [name], MD, Laverta Baltimore RN, RN, Alfonso Patten, Technician, Christell Faith, CRNA Referring MD:              Medicines:                Monitored Anesthesia Care Complications:            No immediate complications. Estimated Blood Loss:     Estimated blood loss: none. Procedure:                Pre-Anesthesia Assessment:                           - Prior to the procedure, a History and Physical                            was performed, and patient medications and                            allergies were reviewed. The patient's tolerance of                            previous anesthesia was also reviewed. The risks                            and benefits of the procedure and the sedation                            options and risks were discussed with the patient.                            All questions were answered, and informed consent                            was obtained. Prior Anticoagulants: The patient                            last took aspirin 1 day prior to the procedure. ASA                            Grade Assessment: III - A patient with severe                            systemic disease. After reviewing the risks and  benefits, the patient was deemed in satisfactory                            condition to undergo the procedure.                           After obtaining informed consent, the endoscope was                            passed under direct vision. Throughout the                            procedure, the patient's blood pressure, pulse, and      oxygen saturations were monitored continuously. The                            EG-[id number] ([id number]) scope was introduced through the                            mouth, and advanced to the second part of duodenum.                            The upper GI endoscopy was accomplished without                            difficulty. The patient tolerated the procedure                            well. Scope In: Scope Out: Findings:      Striped mildly erythematous mucosa without bleeding was found in the       prepyloric region of the stomach.      The examined esophagus was normal.      The exam was otherwise without abnormality.      The cardia and gastric fundus were normal on retroflexion. Impression:               - Erythematous mucosa in the prepyloric region of                            the stomach.                           - Normal esophagus.                           - The examination was otherwise normal. Photos were                            not captured.                           - No specimens collected. Moderate Sedation:      N/A- Per Anesthesia Care Recommendation:           - Patient has a contact number available for  emergencies. The signs and symptoms of potential                            delayed complications were discussed with the                            patient. Return to normal activities tomorrow.                            Written discharge instructions were provided to the                            patient.                           - Resume previous diet.                           - Continue present medications.                           - Repeat upper endoscopy in 2 years for screening                            purposes.                           - Will arrange repeat CMET. INR and CBC via our                            clinic and arrange US guided liver biopsy re:                            cirrhosis and jaundice                            Start cholestyramine 4 g bid to see if can reduce                            pruritus Procedure Code(s):        --- Professional ---                           [phone], Esophagogastroduodenoscopy, flexible,                            transoral; diagnostic, including collection of                            specimen(s) by brushing or washing, when performed                            (separate procedure) Diagnosis Code(s):        --- Professional ---                           J50.09, Other  diseases of stomach and duodenum                           K74.60, Unspecified cirrhosis of liver CPT copyright 2016 American Medical Association. All rights reserved. The codes documented in this report are preliminary and upon coder review may  be revised to meet current compliance requirements. [name], MD 05/04/2016 9:18:23 AM This report has been signed electronically. Number of Addenda: 0

## 2016-05-04 NOTE — Telephone Encounter (Signed)
-----   Message from [name], MD sent at 05/04/2016  9:18 AM EDT ----- Regarding: Liver bx I did this guy's egd due to scheduling fluke - I think you were aware  He has new dx cirrhosis - has high IgG, ferritin 700's and mildly + mitochondrial Ab's  Starting cholestyramine and going ahead w liver bx as etiology not clear - see EGD note for recs - will ask Barbera Setters to order the labs and bx  We can figure out f/u after the bx - technically he was yours due to colon 2008  [name]

## 2016-05-04 NOTE — Anesthesia Preprocedure Evaluation (Addendum)
Anesthesia Evaluation  Patient identified by MRN, date of birth, ID band Patient awake    Reviewed: Allergy & Precautions, NPO status , Patient's Chart, lab work & pertinent test results  Airway Mallampati: II  TM Distance: >3 FB Neck ROM: Full    Dental no notable dental hx.    Pulmonary neg pulmonary ROS, sleep apnea ,    Pulmonary exam normal breath sounds clear to auscultation       Cardiovascular hypertension, negative cardio ROS Normal cardiovascular exam Rhythm:Regular Rate:Normal     Neuro/Psych Anxiety negative neurological ROS  negative psych ROS   GI/Hepatic negative GI ROS, Neg liver ROS, GERD  ,  Endo/Other  negative endocrine ROSHypothyroidism   Renal/GU negative Renal ROS  negative genitourinary   Musculoskeletal negative musculoskeletal ROS (+)   Abdominal   Peds negative pediatric ROS (+)  Hematology negative hematology ROS (+)   Anesthesia Other Findings   Reproductive/Obstetrics negative OB ROS                             Anesthesia Physical Anesthesia Plan  ASA: III  Anesthesia Plan: MAC   Post-op Pain Management:    Induction: Intravenous  Airway Management Planned: Nasal Cannula  Additional Equipment:   Intra-op Plan:   Post-operative Plan: Extubation in OR  Informed Consent: I have reviewed the patients History and Physical, chart, labs and discussed the procedure including the risks, benefits and alternatives for the proposed anesthesia with the patient or authorized representative who has indicated his/her understanding and acceptance.   Dental advisory given  Plan Discussed with: CRNA  Anesthesia Plan Comments:         Anesthesia Quick Evaluation

## 2016-05-04 NOTE — Telephone Encounter (Signed)
Labs entered bx ordered.

## 2016-05-04 NOTE — Interval H&P Note (Signed)
History and Physical Interval Note:  05/04/2016 8:38 AM  [name]  has presented today for surgery, with the diagnosis of cirrhosis  The various methods of treatment have been discussed with the patient and family. After consideration of risks, benefits and other options for treatment, the patient has consented to  Procedure(s): ESOPHAGOGASTRODUODENOSCOPY (EGD) WITH PROPOFOL (N/A) ESOPHAGEAL BANDING (N/A) as a surgical intervention .  The patient's history has been reviewed, patient examined, no change in status, stable for surgery.  I have reviewed the patient's chart and labs.  Questions were answered to the patient's satisfaction.     Silvano Rusk

## 2016-05-04 NOTE — Transfer of Care (Signed)
Immediate Anesthesia Transfer of Care Note  Patient: [name]  Procedure(s) Performed: Procedure(s): ESOPHAGOGASTRODUODENOSCOPY (EGD) WITH PROPOFOL (N/A) ESOPHAGEAL BANDING (N/A)  Patient Location: PACU  Anesthesia Type:MAC  Level of Consciousness:  sedated, patient cooperative and responds to stimulation  Airway & Oxygen Therapy:Patient Spontanous Breathing and Patient connected to face mask oxgen  Post-op Assessment:  Report given to PACU RN and Post -op Vital signs reviewed and stable  Post vital signs:  Reviewed and stable  Last Vitals:  Vitals:   05/04/16 0744 05/04/16 0904  BP: 115/84 116/75  Pulse: 70 62  Resp: 14 16  Temp: 87.6 C     Complications: No apparent anesthesia complications

## 2016-05-04 NOTE — Anesthesia Procedure Notes (Signed)
Procedure Name: MAC Date/Time: 05/04/2016 8:44 AM Performed by: West Pugh Pre-anesthesia Checklist: Patient identified, Emergency Drugs available, Suction available, Patient being monitored and Timeout performed Patient Re-evaluated:Patient Re-evaluated prior to inductionOxygen Delivery Method: Nasal cannula Preoxygenation: Pre-oxygenation with 100% oxygen Placement Confirmation: positive ETCO2 and CO2 detector Dental Injury: Teeth and Oropharynx as per pre-operative assessment

## 2016-05-04 NOTE — Anesthesia Postprocedure Evaluation (Signed)
Anesthesia Post Note  Patient: [name]  Procedure(s) Performed: Procedure(s) (LRB): ESOPHAGOGASTRODUODENOSCOPY (EGD) WITH PROPOFOL (N/A) ESOPHAGEAL BANDING (N/A)  Anesthesia Type: MAC       Last Vitals:  Vitals:   05/04/16 0915 05/04/16 0920  BP:  123/82  Pulse: (!) 57 60  Resp: 13 14  Temp:      Last Pain:  Vitals:   05/04/16 0904  TempSrc: Oral                 Lynda Rainwater

## 2016-05-04 NOTE — H&P (View-Only) (Signed)
HPI: Patient is a 56 year old male referred by PCP Dr. Elsie Stain for evaluation of jaundice. Patient has chronic transaminitis dating back to 2008, predominantly elevation of ALT . Now with new cholestasis and fatigue. No abdominal pain or weight loss. He does complain of generalized itching. HCV ab negative, hepatitis B surface antigen and core antibody negative. Right upper quadrant ultrasound reveals gallbladder polyps, gallbladder wall thickening and probable adenomyomatosis. Subtle liver contour abnormalities raising possibility of cirrhosis. MRI yesterday confirms cirrhosis with portal hypertension including splenomegaly and mild esophageal varices.   Patient drank between ages [age] but mainly on the weekends. Since then he averages only 4 drinks a month. He thinks sister had "immune liver problems".    Past Medical History:  Diagnosis Date  . Allergy   . Bicuspid aortic valve   . Fatty liver    with h/o elevated LFT's  . Heart murmur   . Hypertension   . OSA (obstructive sleep apnea) 09/14/2011   PSG 11/08/11>>AHI 31.6, SpO2 low 85%. wears CPAP  . Other abnormal glucose    diet controlled diabetic  . Thyroid disease     Past Surgical History:  Procedure Laterality Date  . APPENDECTOMY  10/09   Emergency  . BIOPSY THYROID  08/19/07   Attempted, no tissue obtained  . CARDIOVASCULAR STRESS TEST  03/07   Negative 06/05  . DOPPLER ECHOCARDIOGRAPHY  06/04   Family History  Problem Relation Age of Onset  . Asthma Mother   . Cancer Father     Died when pt was 86 of CA with mets, site unknown, COPD  . COPD Father   . Heart disease Sister     Heart stopped  . Prostate cancer Maternal Uncle   . Colon cancer Maternal Grandmother   . Heart disease Maternal Grandfather     MI, 70 YOA  . Esophageal cancer Neg Hx   . Rectal cancer Neg Hx   . Stomach cancer Neg Hx    Social History  Substance Use Topics  . Smoking status: Never Smoker  . Smokeless tobacco: Never  Used  . Alcohol use No     Comment: rare   Current Outpatient Prescriptions  Medication Sig Dispense Refill  . albuterol (PROVENTIL HFA;VENTOLIN HFA) 108 (90 BASE) MCG/ACT inhaler Inhale 2 puffs into the lungs every 6 (six) hours as needed. 18 g 12  . amLODipine (NORVASC) 10 MG tablet TAKE 0.5-1 TABLETS (5-10 MG TOTAL) BY MOUTH DAILY. 90 tablet 3  . azelastine (ASTELIN) 0.1 % nasal spray Place 2 sprays into both nostrils 2 (two) times daily. Use in each nostril as directed 30 mL 12  . fish oil-omega-3 fatty acids 1000 MG capsule Take by mouth daily. Pt doesn't know dosage    . fluticasone (FLONASE) 50 MCG/ACT nasal spray Place 1-2 sprays into both nostrils daily as needed. 48 g 3  . fluticasone (FLOVENT HFA) 110 MCG/ACT inhaler Inhale 2 puffs into the lungs 2 (two) times daily. Rinse after use. 1 Inhaler 12  . levothyroxine (SYNTHROID, LEVOTHROID) 125 MCG tablet Take one tablet by mouth every morning 30 minutes before breakfast. *Needs an appointment with labs prior for additional refills* 30 tablet 0  . omeprazole (PRILOSEC) 20 MG capsule Take 20 mg by mouth daily.     No current facility-administered medications for this visit.    No Known Allergies   Review of Systems: All systems reviewed and negative except where noted in HPI.    Physical Exam:  BP 104/78   Pulse 68   Ht 5\' 8"  (1.727 m)   Wt 187 lb (84.8 kg)   BMI 28.43 kg/m  Constitutional:  Well-developed, white male in no acute distress. Psychiatric: Normal mood and affect. Behavior is normal. EENT:  Pupils round, mildly icteric sclerae. Neck supple.  Cardiovascular: Normal rate, regular rhythm.  Pulmonary/chest: Effort normal and breath sounds normal. No wheezing, rales or rhonchi. Abdominal: Soft, nondistended, nontender. Bowel sounds active throughout. There are no masses palpable. No hepatomegaly. Extremities: no edema. No palmar erythema.  Lymphadenopathy: No cervical adenopathy noted. Neurological: Alert and  oriented to person place and time. Musculoskeletal : normal tone and strength. No temporal wasting.  Skin: Skin is warm and dry. No rashes noted. May have a few small angiomas on chest though they don't blanch as expected.    ASSESSMENT AND PLAN:   56 yo male with chronic transaminitis, now with new cholestasis and finding of cirrhosis with portal hypertension on MRI. No history of heavy ETOH. Known fatty liver since at least 2002 so NASH possible but seems less likely based on degree of ALT elevation through the years. Autoimmune disease?  -Needs liver labs to determine etiology of cirrhosis. Recent HBsAg negative, HCV negative. Will obtain ferritin, ANA, ASMA, IgG, AMA, alpha 1 antitrypsin. Given age, Wilson's unlikely so no need for ceruloplasmin.  -He will need and EGD for varices screening. MRI characterizes them as small -eventual HAV, HBV vaccinations -obtain prothrombin time -no ETOH going forward -will notify patient of lab results as they become available.   2. Abnormal gallbladder on ultrasound -  Polyps measuring 6.25mm, GB mildly thickened, probable adenomyomatosis.   3. Colon cancer screening. No polyps on colonoscopy by Dr. Ardis Hughs Aug 2017  Addendum: I discussed case today with Dr. Carlean Purl. He agrees it is probably best to do EGD at hospital to allow option of variceal banding. I inadvertently scheduled procedure with Dr. Carlean Purl who was fine with leaving the patient on his schedule. I will forward this to Dr. Maryelizabeth Rowan, NP  04/16/2016, 11:33 AM  Cc: [name], MD Agree with Ms. Eithen Castiglia's assessment and plan. [name], MD, [facility]

## 2016-05-04 NOTE — Anesthesia Postprocedure Evaluation (Signed)
Anesthesia Post Note  Patient: [name]  Procedure(s) Performed: Procedure(s) (LRB): ESOPHAGOGASTRODUODENOSCOPY (EGD) WITH PROPOFOL (N/A) ESOPHAGEAL BANDING (N/A)  Patient location during evaluation: PACU Anesthesia Type: MAC Level of consciousness: awake and alert Pain management: pain level controlled Vital Signs Assessment: post-procedure vital signs reviewed and stable Respiratory status: spontaneous breathing, nonlabored ventilation and respiratory function stable Cardiovascular status: stable and blood pressure returned to baseline Anesthetic complications: no       Last Vitals:  Vitals:   05/04/16 0915 05/04/16 0920  BP:  123/82  Pulse: (!) 57 60  Resp: 13 14  Temp:      Last Pain:  Vitals:   05/04/16 0904  TempSrc: Oral                 Lynda Rainwater

## 2016-05-05 ENCOUNTER — Other Ambulatory Visit (INDEPENDENT_AMBULATORY_CARE_PROVIDER_SITE_OTHER): Payer: BLUE CROSS/BLUE SHIELD

## 2016-05-05 DIAGNOSIS — R17 Unspecified jaundice: Secondary | ICD-10-CM

## 2016-05-05 DIAGNOSIS — K7469 Other cirrhosis of liver: Secondary | ICD-10-CM

## 2016-05-05 DIAGNOSIS — E039 Hypothyroidism, unspecified: Secondary | ICD-10-CM | POA: Diagnosis not present

## 2016-05-05 LAB — CBC WITH DIFFERENTIAL/PLATELET
Basophils Absolute: 0.1 10*3/uL (ref 0.0–0.1)
Basophils Relative: 0.6 % (ref 0.0–3.0)
Eosinophils Absolute: 0.3 10*3/uL (ref 0.0–0.7)
Eosinophils Relative: 2.7 % (ref 0.0–5.0)
HCT: 41.3 % (ref 39.0–52.0)
Hemoglobin: 14.1 g/dL (ref 13.0–17.0)
Lymphocytes Relative: 19.3 % (ref 12.0–46.0)
Lymphs Abs: 2 10*3/uL (ref 0.7–4.0)
MCHC: 34.2 g/dL (ref 30.0–36.0)
MCV: 92.8 fl (ref 78.0–100.0)
Monocytes Absolute: 1.2 10*3/uL — ABNORMAL HIGH (ref 0.1–1.0)
Monocytes Relative: 11.7 % (ref 3.0–12.0)
Neutro Abs: 7 10*3/uL (ref 1.4–7.7)
Neutrophils Relative %: 65.7 % (ref 43.0–77.0)
Platelets: 235 10*3/uL (ref 150.0–400.0)
RBC: 4.45 Mil/uL (ref 4.22–5.81)
RDW: 18 % — ABNORMAL HIGH (ref 11.5–15.5)
WBC: 10.6 10*3/uL — ABNORMAL HIGH (ref 4.0–10.5)

## 2016-05-05 LAB — COMPREHENSIVE METABOLIC PANEL
ALT: 65 U/L — ABNORMAL HIGH (ref 0–53)
AST: 90 U/L — ABNORMAL HIGH (ref 0–37)
Albumin: 3.8 g/dL (ref 3.5–5.2)
Alkaline Phosphatase: 258 U/L — ABNORMAL HIGH (ref 39–117)
BUN: 20 mg/dL (ref 6–23)
CO2: 26 mEq/L (ref 19–32)
Calcium: 9.4 mg/dL (ref 8.4–10.5)
Chloride: 102 mEq/L (ref 96–112)
Creatinine, Ser: 0.99 mg/dL (ref 0.40–1.50)
GFR: 83.75 mL/min (ref >60.00)
Glucose, Bld: 99 mg/dL (ref 70–99)
Potassium: 4.3 mEq/L (ref 3.5–5.1)
Sodium: 134 mEq/L — ABNORMAL LOW (ref 135–145)
Total Bilirubin: 7.8 mg/dL — ABNORMAL HIGH (ref 0.2–1.2)
Total Protein: 7.8 g/dL (ref 6.0–8.3)

## 2016-05-05 LAB — TSH: TSH: 16.41 u[IU]/mL — ABNORMAL HIGH (ref 0.35–4.50)

## 2016-05-05 LAB — PROTIME-INR
INR: 1.3 ratio — ABNORMAL HIGH (ref 0.8–1.0)
Prothrombin Time: 13.7 s — ABNORMAL HIGH (ref 9.6–13.1)

## 2016-05-05 NOTE — Telephone Encounter (Signed)
We switched his cholestyramine to the CAN due to the high cost of the packets, ok per Barb Merino, [facility]

## 2016-05-05 NOTE — Telephone Encounter (Signed)
Patient has been scheduled for 05/12/16 1:00.  Appt was scheduled with the patient and he is aware

## 2016-05-06 ENCOUNTER — Encounter (HOSPITAL_COMMUNITY): Payer: Self-pay | Admitting: Internal Medicine

## 2016-05-06 NOTE — Progress Notes (Signed)
Liver tests seem stable Await liver bx My Chart note

## 2016-05-10 ENCOUNTER — Other Ambulatory Visit: Payer: Self-pay | Admitting: Family Medicine

## 2016-05-10 DIAGNOSIS — E039 Hypothyroidism, unspecified: Secondary | ICD-10-CM

## 2016-05-10 MED ORDER — LEVOTHYROXINE SODIUM 137 MCG PO TABS: 137.0000 ug | ORAL_TABLET | Freq: Every day | ORAL | 3 refills | Status: DC

## 2016-05-11 ENCOUNTER — Other Ambulatory Visit: Payer: Self-pay | Admitting: Radiology

## 2016-05-12 ENCOUNTER — Ambulatory Visit (HOSPITAL_COMMUNITY)
Admission: RE | Admit: 2016-05-12 | Discharge: 2016-05-12 | Disposition: A | Discharge status: Home / Self Care | Payer: BLUE CROSS/BLUE SHIELD | Source: Ambulatory Visit | Attending: Internal Medicine | Admitting: Internal Medicine

## 2016-05-12 ENCOUNTER — Encounter (HOSPITAL_COMMUNITY): Payer: Self-pay

## 2016-05-12 DIAGNOSIS — K766 Portal hypertension: Secondary | ICD-10-CM | POA: Diagnosis not present

## 2016-05-12 DIAGNOSIS — I8510 Secondary esophageal varices without bleeding: Secondary | ICD-10-CM | POA: Insufficient documentation

## 2016-05-12 DIAGNOSIS — R161 Splenomegaly, not elsewhere classified: Secondary | ICD-10-CM | POA: Insufficient documentation

## 2016-05-12 DIAGNOSIS — K831 Obstruction of bile duct: Secondary | ICD-10-CM | POA: Diagnosis not present

## 2016-05-12 DIAGNOSIS — R7989 Other specified abnormal findings of blood chemistry: Secondary | ICD-10-CM | POA: Diagnosis not present

## 2016-05-12 DIAGNOSIS — K7460 Unspecified cirrhosis of liver: Secondary | ICD-10-CM | POA: Diagnosis not present

## 2016-05-12 DIAGNOSIS — K7689 Other specified diseases of liver: Secondary | ICD-10-CM | POA: Diagnosis not present

## 2016-05-12 DIAGNOSIS — K7469 Other cirrhosis of liver: Secondary | ICD-10-CM

## 2016-05-12 DIAGNOSIS — R17 Unspecified jaundice: Secondary | ICD-10-CM

## 2016-05-12 LAB — COMPREHENSIVE METABOLIC PANEL
ALT: 66 U/L — ABNORMAL HIGH (ref 17–63)
AST: 102 U/L — ABNORMAL HIGH (ref 15–41)
Albumin: 3.5 g/dL (ref 3.5–5.0)
Alkaline Phosphatase: 258 U/L — ABNORMAL HIGH (ref 38–126)
Anion gap: 8 (ref 5–15)
BUN: 17 mg/dL (ref 6–20)
CO2: 21 mmol/L — ABNORMAL LOW (ref 22–32)
Calcium: 9.1 mg/dL (ref 8.9–10.3)
Chloride: 109 mmol/L (ref 101–111)
Creatinine, Ser: 0.75 mg/dL (ref 0.61–1.24)
GFR calc Af Amer: >60 mL/min (ref >60)
GFR calc non Af Amer: >60 mL/min (ref >60)
Glucose, Bld: 91 mg/dL (ref 65–99)
Potassium: 3.9 mmol/L (ref 3.5–5.1)
Sodium: 138 mmol/L (ref 135–145)
Total Bilirubin: 7.7 mg/dL — ABNORMAL HIGH (ref 0.3–1.2)
Total Protein: 8.2 g/dL — ABNORMAL HIGH (ref 6.5–8.1)

## 2016-05-12 LAB — CBC WITH DIFFERENTIAL/PLATELET
Basophils Absolute: 0.2 10*3/uL — ABNORMAL HIGH (ref 0.0–0.1)
Basophils Relative: 2 %
Eosinophils Absolute: 0.3 10*3/uL (ref 0.0–0.7)
Eosinophils Relative: 3 %
HCT: 38.5 % — ABNORMAL LOW (ref 39.0–52.0)
Hemoglobin: 13.6 g/dL (ref 13.0–17.0)
Lymphocytes Relative: 20 %
Lymphs Abs: 2.2 10*3/uL (ref 0.7–4.0)
MCH: 31.9 pg (ref 26.0–34.0)
MCHC: 35.3 g/dL (ref 30.0–36.0)
MCV: 90.2 fL (ref 78.0–100.0)
Monocytes Absolute: 0.9 10*3/uL (ref 0.1–1.0)
Monocytes Relative: 8 %
Neutro Abs: 7.5 10*3/uL (ref 1.7–7.7)
Neutrophils Relative %: 67 %
Platelets: 252 10*3/uL (ref 150–400)
RBC: 4.27 MIL/uL (ref 4.22–5.81)
RDW: 17 % — ABNORMAL HIGH (ref 11.5–15.5)
WBC: 11 10*3/uL — ABNORMAL HIGH (ref 4.0–10.5)

## 2016-05-12 LAB — PROTIME-INR
INR: 1.09
Prothrombin Time: 14.2 seconds (ref 11.4–15.2)

## 2016-05-12 MED ORDER — FLUMAZENIL 0.5 MG/5ML IV SOLN
INTRAVENOUS | Status: AC
  Filled 2016-05-12: qty 5

## 2016-05-12 MED ORDER — SODIUM CHLORIDE 0.9 % IV SOLN
INTRAVENOUS | Status: DC
  Administered 2016-05-12: 11:00:00 via INTRAVENOUS

## 2016-05-12 MED ORDER — FENTANYL CITRATE (PF) 100 MCG/2ML IJ SOLN
INTRAMUSCULAR | Status: AC
  Filled 2016-05-12: qty 4

## 2016-05-12 MED ORDER — MIDAZOLAM HCL 2 MG/2ML IJ SOLN
INTRAMUSCULAR | Status: AC | PRN
Start: 2016-05-12 — End: 2016-05-12
  Administered 2016-05-12 (×2): 1 mg via INTRAVENOUS

## 2016-05-12 MED ORDER — NALOXONE HCL 0.4 MG/ML IJ SOLN
INTRAMUSCULAR | Status: AC
  Filled 2016-05-12: qty 1

## 2016-05-12 MED ORDER — FENTANYL CITRATE (PF) 100 MCG/2ML IJ SOLN
INTRAMUSCULAR | Status: AC | PRN
  Administered 2016-05-12 (×2): 50 ug via INTRAVENOUS

## 2016-05-12 MED ORDER — MIDAZOLAM HCL 2 MG/2ML IJ SOLN
INTRAMUSCULAR | Status: DC
Start: 2016-05-12 — End: 2016-05-13
  Filled 2016-05-12: qty 4

## 2016-05-12 NOTE — Consult Note (Signed)
Chief Complaint: Patient was seen in consultation today for ultrasound-guided random liver core biopsy  Referring Physician(s): Gessner,Carl E  Supervising Physician: Arne Cleveland  Patient Status: [facility] - Out-pt  History of Present Illness: [name] is a 56 y.o. male with history of jaundice, elevated liver function tests, elevated IgG, elevated ferritin, mildly positive mitochondrial antibodies as well as cirrhosis, portal hypertension, splenomegaly and esophageal varices by imaging. He presents today for ultrasound-guided random core liver biopsy for further evaluation.  Past Medical History:  Diagnosis Date  . Allergy   . Asthma   . Bicuspid aortic valve    sees dr Harrington Challenger  . Fatty liver    with h/o elevated LFT's  . GERD (gastroesophageal reflux disease)   . Heart murmur   . Hypertension   . Itching    all over last few months  . Jaundice last several months  . OSA (obstructive sleep apnea) 09/14/2011   PSG 11/08/11>>AHI 31.6, SpO2 low 85%. wears CPAP, pt does not know settings  . Other abnormal glucose    diet controlled diabetic  . Thyroid disease     Past Surgical History:  Procedure Laterality Date  . APPENDECTOMY  10/09   Emergency  . BIOPSY THYROID  08/19/07   Attempted, no tissue obtained  . CARDIOVASCULAR STRESS TEST  03/07   Negative 06/05  . DOPPLER ECHOCARDIOGRAPHY  06/04  . ESOPHAGEAL BANDING N/A 05/04/2016   Procedure: ESOPHAGEAL BANDING;  Surgeon: [name], MD;  Location: [facility];  Service: Endoscopy;  Laterality: N/A;  . ESOPHAGOGASTRODUODENOSCOPY (EGD) WITH PROPOFOL N/A 05/04/2016   Procedure: ESOPHAGOGASTRODUODENOSCOPY (EGD) WITH PROPOFOL;  Surgeon: [name], MD;  Location: [facility];  Service: Endoscopy;  Laterality: N/A;    Allergies: Watermelon flavor  Medications: Prior to Admission medications   Medication Sig Start Date End Date Taking? Authorizing Provider  albuterol (PROVENTIL HFA;VENTOLIN HFA) 108 (90 BASE)  MCG/ACT inhaler Inhale 2 puffs into the lungs every 6 (six) hours as needed. 02/11/15  Yes [name], MD  amLODipine (NORVASC) 10 MG tablet TAKE 0.5-1 TABLETS (5-10 MG TOTAL) BY MOUTH DAILY. Patient taking differently: Take 10 mg by mouth daily.  02/11/15  Yes [name], MD  azelastine (ASTELIN) 0.1 % nasal spray Place 2 sprays into both nostrils 2 (two) times daily. Use in each nostril as directed Patient taking differently: Place 2 sprays into both nostrils 2 (two) times daily as needed for rhinitis or allergies.  02/13/15  Yes [name], MD  cholestyramine light (PREVALITE) 4 g packet Take 1 packet (4 g total) by mouth 2 (two) times daily. 05/04/16  Yes [name], MD  fluticasone (FLOVENT HFA) 110 MCG/ACT inhaler Inhale 2 puffs into the lungs 2 (two) times daily. Rinse after use. 04/09/16  Yes [name], MD  levothyroxine (SYNTHROID, LEVOTHROID) 137 MCG tablet Take 1 tablet (137 mcg total) by mouth daily before breakfast. 05/10/16  Yes [name], MD  Omega-3 Fatty Acids (FISH OIL PO) Take 1 capsule by mouth 3 (three) times a week.   Yes Historical Provider, MD  omeprazole (PRILOSEC) 20 MG capsule Take 20 mg by mouth daily.   Yes Historical Provider, MD  aspirin EC 81 MG tablet Take 81 mg by mouth daily.    Historical Provider, MD  fluticasone (FLONASE) 50 MCG/ACT nasal spray Place 1-2 sprays into both nostrils daily as needed. Patient taking differently: Place 1-2 sprays into both nostrils daily as needed for allergies.  02/11/15   [name], MD  ibuprofen (ADVIL,MOTRIN) 200 MG tablet Take 200 mg by mouth daily as needed for headache.    Historical Provider, MD     Family History  Problem Relation Age of Onset  . Asthma Mother   . Cancer Father     Died when pt was 46 of CA with mets, site unknown, COPD  . COPD Father   . Heart disease Sister     Heart stopped  . Prostate cancer Maternal Uncle   . Colon cancer Maternal Grandmother   . Heart disease  Maternal Grandfather     MI, 53 YOA  . Esophageal cancer Neg Hx   . Rectal cancer Neg Hx   . Stomach cancer Neg Hx     Social History   Social History  . Marital status: Married    Spouse name: N/A  . Number of children: 1  . Years of education: N/A   Occupational History  . [employer] "front" and writes orders --in [place] History Main Topics  . Smoking status: Never Smoker  . Smokeless tobacco: Never Used  . Alcohol use No     Comment: none due to liver problem  . Drug use: No  . Sexual activity: Not Asked   Other Topics Concern  . None   Social History Narrative   Married, 1989   1 child and 2 stepchildren   Occupation: working Pensions consultant "front" and writes orders--in [place] denies fever,HA,CP,dyspnea, cough, back pain,N/V or sig bleeding; does have jaundice, itching, abd pain.  Vital Signs: Ht 5\' 8"  (1.727 m)   Wt 192 lb (87.1 kg)   BMI 29.19 kg/m   Physical Exam awake/alert; scleral icterus; chest- few rt basilar crackles, left clear; heart- RRR with murmur; abd- sl dist, +BS, mild diffuse tenderness, splenomegaly; trace pretibial edema  Mallampati Score:     Imaging: Mr Liver W Wo Contrast  Result Date: 04/15/2016 CLINICAL DATA:  Elevated liver function tests. Indeterminate hepatic lesion and suspected hepatic cirrhosis on recent ultrasound. EXAM: MRI ABDOMEN WITHOUT AND WITH CONTRAST TECHNIQUE: Multiplanar multisequence MR imaging of the abdomen was performed both before and after the administration of intravenous contrast. CONTRAST:  16mL MULTIHANCE GADOBENATE DIMEGLUMINE 529 MG/ML IV SOLN COMPARISON:  Ultrasound on 04/13/2016 FINDINGS: Lower chest: No acute findings. Hepatobiliary: Hepatic cirrhosis is demonstrated, with diffuse tiny T2 hypointense siderotic regenerative nodules throughout the hepatic parenchyma. No evidence of hypervascular liver mass. Unremarkable appearance of  gallbladder. No evidence of biliary ductal dilatation. Pancreas:  No mass or inflammatory changes. Spleen: Moderate splenomegaly, with length measuring approximately 16 cm. No masses identified . Adrenals/Urinary Tract: No masses identified. No evidence of hydronephrosis. Stomach/Bowel: Visualized portions within the abdomen are unremarkable. Vascular/Lymphatic: No pathologically enlarged lymph nodes identified. No abdominal aortic aneurysm. Recanalization of periumbilical veins noted. Abdominal venous collaterals and mild esophageal varices also noted. Other:  No ascites. Musculoskeletal:  No suspicious bone lesions identified. IMPRESSION: Hepatic cirrhosis.  No radiographic evidence of hepatic malignancy. Findings of portal venous hypertension, including moderate splenomegaly, recanalization of periumbilical veins, and mild esophageal varices. Electronically Signed   By: Earle Gell M.D.   On: 04/15/2016 12:40   US Abdomen Limited Ruq  Result Date: 04/13/2016 CLINICAL DATA:  Elevated liver function studies EXAM: US ABDOMEN LIMITED - RIGHT UPPER QUADRANT COMPARISON:  Abdominal ultrasound dated August 16, 2012 FINDINGS: Gallbladder: The gallbladder is adequately distended.  No echogenic mobile shadowing stones are observed. There are polyps present measuring up to 6.6 mm in diameter. There are echogenic foci with ring down artifact which may reflect adenoma and on mitosis. There is a small amount of pericholecystic fluid. The gallbladder wall is mildly thickened at 4.4 mm. Common bile duct: Diameter: 3.1 mm where visualized. Liver: The hepatic echotexture is heterogeneous. The surface contour is irregular. There is a hypoechoic to anechoic structure in the right lobe measuring 8 mm in diameter. There is no intrahepatic ductal dilation. IMPRESSION: Gallbladder polyps, gallbladder wall thickening, and probable adenomyomatosis. The findings may reflect subacute or chronic cholecystitis. Heterogeneous hepatic  echotexture. Subcentimeter hypoechoic to anechoic cyst or mass in the right lobe. Subtle surface contour irregularity. The findings could reflect cirrhosis in the appropriate clinical setting. Hepatic protocol MRI is recommended. Electronically Signed   By: David  Martinique M.D.   On: 04/13/2016 13:40    Labs:  CBC:  Recent Labs  04/09/16 1110 05/05/16 1124 05/12/16 1117  WBC 8.3 10.6* 11.0*  HGB 14.5 14.1 13.6  HCT 42.3 41.3 38.5*  PLT 244.0 235.0 252    COAGS:  Recent Labs  04/16/16 1220 05/05/16 1124 05/12/16 1117  INR 1.2* 1.3* 1.09    BMP:  Recent Labs  04/09/16 1110 05/05/16 1124  NA 134* 134*  K 4.0 4.3  CL 101 102  CO2 27 26  GLUCOSE 92 99  BUN 16 20  CALCIUM 9.0 9.4  CREATININE 0.77 0.99    LIVER FUNCTION TESTS:  Recent Labs  04/09/16 1110 05/05/16 1124  BILITOT 5.1* 7.8*  AST 103* 90*  ALT 86* 65*  ALKPHOS 253* 258*  PROT 8.0 7.8  ALBUMIN 0.0* 3.8    TUMOR MARKERS: No results for input(s): AFPTM, CEA, CA199, CHROMGRNA in the last 8760 hours.  Assessment and Plan: 56 y.o. male with history of jaundice, elevated liver function tests, elevated IgG, elevated ferritin, mildly positive mitochondrial antibodies as well as cirrhosis, portal hypertension, splenomegaly and esophageal varices by imaging. He presents today for ultrasound-guided random core liver biopsy for further evaluation.Risks and benefits discussed with the patient/spouse including, but not limited to bleeding, infection, damage to adjacent structures or low yield requiring additional tests.All of the patient's questions were answered, patient is agreeable to proceed.Consent signed and in chart.     Thank you for this interesting consult.  I greatly enjoyed meeting [name] and look forward to participating in their care.  A copy of this report was sent to the requesting provider on this date.  Electronically Signed: D. Rowe Robert 05/12/2016, 11:50 AM   I spent a total  of 20 minutes in face to face in clinical consultation, greater than 50% of which was counseling/coordinating care for image guided random liver biopsy

## 2016-05-12 NOTE — Procedures (Signed)
US liver core 18g x3 to surg path No complication No blood loss. See complete dictation in [facility] PACS.  

## 2016-05-12 NOTE — Discharge Instructions (Signed)
Liver Biopsy, Care After °These instructions give you information on caring for yourself after your procedure. Your doctor may also give you more specific instructions. Call your doctor if you have any problems or questions after your procedure. °Follow these instructions at home: °· Rest at home for 1-2 days or as told by your doctor. °· Have someone stay with you for at least 24 hours. °· Do not do these things in the first 24 hours: °¨ Drive. °¨ Use machinery. °¨ Take care of other people. °¨ Sign legal documents. °¨ Take a bath or shower. °· There are many different ways to close and cover a cut (incision). For example, a cut can be closed with stitches, skin glue, or adhesive strips. Follow your doctor's instructions on: °¨ Taking care of your cut. °¨ Changing and removing your bandage (dressing). °¨ Removing whatever was used to close your cut. °· Do not drink alcohol in the first week. °· Do not lift more than 5 pounds or play contact sports for the first 2 weeks. °· Take medicines only as told by your doctor. For 1 week, do not take medicine that has aspirin in it or medicines like ibuprofen. °· Get your test results. °Contact a doctor if: °· A cut bleeds and leaves more than just a small spot of blood. °· A cut is red, puffs up (swells), or hurts more than before. °· Fluid or something else comes from a cut. °· A cut smells bad. °· You have a fever or chills. °Get help right away if: °· You have swelling, bloating, or pain in your belly (abdomen). °· You get dizzy or faint. °· You have a rash. °· You feel sick to your stomach (nauseous) or throw up (vomit). °· You have trouble breathing, feel short of breath, or feel faint. °· Your chest hurts. °· You have problems talking or seeing. °· You have trouble balancing or moving your arms or legs. °This information is not intended to replace advice given to you by your health care provider. Make sure you discuss any questions you have with your health care  provider. °Document Released: 11/19/2007 Document Revised: 07/18/2015 Document Reviewed: 04/07/2013 °Elsevier Interactive Patient Education © 2017 Elsevier Inc[employer] °Moderate Conscious Sedation, Adult, Care After °These instructions provide you with information about caring for yourself after your procedure. Your health care provider may also give you more specific instructions. Your treatment has been planned according to current medical practices, but problems sometimes occur. Call your health care provider if you have any problems or questions after your procedure. °What can I expect after the procedure? °After your procedure, it is common: °· To feel sleepy for several hours. °· To feel clumsy and have poor balance for several hours. °· To have poor judgment for several hours. °· To vomit if you eat too soon. °Follow these instructions at home: °For at least 24 hours after the procedure:  ° °· Do not: °¨ Participate in activities where you could fall or become injured. °¨ Drive. °¨ Use heavy machinery. °¨ Drink alcohol. °¨ Take sleeping pills or medicines that cause drowsiness. °¨ Make important decisions or sign legal documents. °¨ Take care of children on your own. °· Rest. °Eating and drinking  °· Follow the diet recommended by your health care provider. °· If you vomit: °¨ Drink water, juice, or soup when you can drink without vomiting. °¨ Make sure you have little or no nausea before eating solid foods. °General instructions  °· Have   a responsible adult stay with you until you are awake and alert. °· Take over-the-counter and prescription medicines only as told by your health care provider. °· If you smoke, do not smoke without supervision. °· Keep all follow-up visits as told by your health care provider. This is important. °Contact a health care provider if: °· You keep feeling nauseous or you keep vomiting. °· You feel light-headed. °· You develop a rash. °· You have a fever. °Get help right away if: °· You  have trouble breathing. °This information is not intended to replace advice given to you by your health care provider. Make sure you discuss any questions you have with your health care provider. °Document Released: 11/30/2012 Document Revised: 07/15/2015 Document Reviewed: 06/01/2015 °Elsevier Interactive Patient Education © 2017 Elsevier Inc. ° °

## 2016-05-18 NOTE — Progress Notes (Signed)
Forwarding results to his reg GI MD Dr. Ronnie Derby cirrhosis - cause not clear Hepatology evaluation seems reasonable vs trial of steroids

## 2016-05-19 ENCOUNTER — Other Ambulatory Visit: Payer: Self-pay

## 2016-05-19 DIAGNOSIS — K7469 Other cirrhosis of liver: Secondary | ICD-10-CM

## 2016-05-20 ENCOUNTER — Ambulatory Visit: Payer: BLUE CROSS/BLUE SHIELD | Admitting: Pulmonary Disease

## 2016-05-20 ENCOUNTER — Other Ambulatory Visit (INDEPENDENT_AMBULATORY_CARE_PROVIDER_SITE_OTHER): Payer: BLUE CROSS/BLUE SHIELD

## 2016-05-20 DIAGNOSIS — K7469 Other cirrhosis of liver: Secondary | ICD-10-CM

## 2016-05-20 LAB — IBC PANEL
Iron: 88 ug/dL (ref 42–165)
Saturation Ratios: 30.5 % (ref 20.0–50.0)
Transferrin: 206 mg/dL — ABNORMAL LOW (ref 212.0–360.0)

## 2016-05-20 LAB — FERRITIN: Ferritin: 498.1 ng/mL — ABNORMAL HIGH (ref 22.0–322.0)

## 2016-05-25 ENCOUNTER — Telehealth: Payer: Self-pay | Admitting: Internal Medicine

## 2016-05-25 NOTE — Telephone Encounter (Signed)
We need to send him to the [facility] here in [place] re: cirrhosis - cause not clear - has had liver biopsy ? If could be autoimmune  See if they can work him in in 1-2 weeks please

## 2016-05-25 NOTE — Telephone Encounter (Signed)
[name], patient last saw [place].

## 2016-05-25 NOTE — Telephone Encounter (Signed)
I called the liver clinic and was told by [facility] that the referrals are backed up and the pt should be called by the end of the week.  I did call the pt and advise him of the status and provided him with the phone number as well if he wants to call and inquire about the status.

## 2016-05-25 NOTE — Telephone Encounter (Signed)
Doc of the day I cannot find where anyone ordered a referral to a liver clinic. [name] and Dr Ardis Hughs are out of the office for the next 3 days. He is a [employer] investment [employer] patient and most recently had labs drawn under Dr Ardis Hughs name. Would you please advise on the referral?

## 2016-05-25 NOTE — Telephone Encounter (Signed)
Notes recorded by Barron Alvine, RN on 05/19/2016 at 10:02 AM EDT Pt has been notified and will have labs and will call if he does not hear from the [facility] liver clinic in 1 week.

## 2016-06-02 NOTE — Telephone Encounter (Signed)
Appt has been given to the pt for 06/03/16 215 pm at [facility].  Pt notified

## 2016-06-03 ENCOUNTER — Encounter: Payer: Self-pay | Admitting: Acute Care

## 2016-06-03 ENCOUNTER — Ambulatory Visit (INDEPENDENT_AMBULATORY_CARE_PROVIDER_SITE_OTHER): Payer: BLUE CROSS/BLUE SHIELD | Admitting: Acute Care

## 2016-06-03 DIAGNOSIS — G4733 Obstructive sleep apnea (adult) (pediatric): Secondary | ICD-10-CM

## 2016-06-03 DIAGNOSIS — K7460 Unspecified cirrhosis of liver: Secondary | ICD-10-CM | POA: Diagnosis not present

## 2016-06-03 DIAGNOSIS — K831 Obstruction of bile duct: Secondary | ICD-10-CM | POA: Diagnosis not present

## 2016-06-03 NOTE — Progress Notes (Signed)
History of Present Illness [name] is a 56 y.o. male with OSA on CPAP. He is followed by Dr. Halford Chessman.   4/11/2018CPAP follow up: Pt. Presents for folow up of CPAP use. He states he is doing well on CPAP. He states he is compliant and wears it every night. He states he wears it for greater than 4 hours each night. He did not bring his sim card in today.I am going to ask him to bring the sim card in for a down load. He states he has less daytime sleepiness. He does have some other health issues at present which is affecting his energy levels.He was just diagnosed with cirrhosis. He is being followed by the [facility]. They are trying to determine cause.Pt. States he has no issues with his CPAP treatment.He does feel he needs new supplies. States he sometimes feels his mask is too small.Last down Load is from 2015.We will plan on patient bringing in Sim card and getting down Load. If there are no issues with treatment, we will order supplies.  Tests Down Load: No Sim Card.   Past medical hx Past Medical History:  Diagnosis Date  . Allergy   . Asthma   . Bicuspid aortic valve    sees dr Harrington Challenger  . Fatty liver    with h/o elevated LFT's  . GERD (gastroesophageal reflux disease)   . Heart murmur   . Hypertension   . Itching    all over last few months  . Jaundice last several months  . OSA (obstructive sleep apnea) 09/14/2011   PSG 11/08/11>>AHI 31.6, SpO2 low 85%. wears CPAP, pt does not know settings  . Other abnormal glucose    diet controlled diabetic  . Thyroid disease      Past surgical hx, Family hx, Social hx all reviewed.  Current Outpatient Prescriptions on File Prior to Visit  Medication Sig  . albuterol (PROVENTIL HFA;VENTOLIN HFA) 108 (90 BASE) MCG/ACT inhaler Inhale 2 puffs into the lungs every 6 (six) hours as needed.  [name] amLODipine (NORVASC) 10 MG tablet TAKE 0.5-1 TABLETS (5-10 MG TOTAL) BY MOUTH DAILY. (Patient taking differently: Take 10 mg by mouth daily. )   . aspirin EC 81 MG tablet Take 81 mg by mouth daily.  [name] azelastine (ASTELIN) 0.1 % nasal spray Place 2 sprays into both nostrils 2 (two) times daily. Use in each nostril as directed (Patient taking differently: Place 2 sprays into both nostrils 2 (two) times daily as needed for rhinitis or allergies. )  . cholestyramine light (PREVALITE) 4 g packet Take 1 packet (4 g total) by mouth 2 (two) times daily.  . fluticasone (FLONASE) 50 MCG/ACT nasal spray Place 1-2 sprays into both nostrils daily as needed. (Patient taking differently: Place 1-2 sprays into both nostrils daily as needed for allergies. )  . fluticasone (FLOVENT HFA) 110 MCG/ACT inhaler Inhale 2 puffs into the lungs 2 (two) times daily. Rinse after use.  . ibuprofen (ADVIL,MOTRIN) 200 MG tablet Take 200 mg by mouth daily as needed for headache.  . levothyroxine (SYNTHROID, LEVOTHROID) 137 MCG tablet Take 1 tablet (137 mcg total) by mouth daily before breakfast.  . Omega-3 Fatty Acids (FISH OIL PO) Take 1 capsule by mouth 3 (three) times a week.  [name] omeprazole (PRILOSEC) 20 MG capsule Take 20 mg by mouth daily.   No current facility-administered medications on file prior to visit.      Allergies  Allergen Reactions  . Watermelon Flavor  Mouth itching    Review Of Systems:  Constitutional:   No  weight loss, night sweats,  Fevers, chills,+ fatigue, or  lassitude.  HEENT:   No headaches,  Difficulty swallowing,  Tooth/dental problems, or  Sore throat,                No sneezing, itching, ear ache, nasal congestion, post nasal drip,   CV:  No chest pain,  Orthopnea, PND, swelling in lower extremities, anasarca, dizziness, palpitations, syncope.   GI  No heartburn, indigestion, abdominal pain, nausea, vomiting, diarrhea, change in bowel habits, loss of appetite, bloody stools.   Resp: No shortness of breath with exertion or at rest.  No excess mucus, no productive cough,  No non-productive cough,  No coughing up of blood.  No  change in color of mucus.  No wheezing.  No chest wall deformity  Skin: no rash or lesions.  GU: no dysuria, change in color of urine, no urgency or frequency.  No flank pain, no hematuria   MS:  No joint pain or swelling.  No decreased range of motion.  No back pain.  Psych:  No change in mood or affect. No depression or anxiety.  No memory loss.   Vital Signs BP 126/80 (BP Location: Left Arm, Cuff Size: Normal)   Pulse 70   Ht 5\' 9"  (1.753 m)   Wt 191 lb 6.4 oz (86.8 kg)   SpO2 99%   BMI 28.26 kg/m    Physical Exam:  General- No distress,  A&Ox 3, pleasant ENT: No sinus tenderness, TM clear, pale nasal mucosa, no oral exudate,no post nasal drip, no LAN, jaundiced sclera Cardiac: S1, S2, regular rate and rhythm, no murmur Chest: No wheeze/ rales/ dullness; no accessory muscle use, no nasal flaring, no sternal retractions Abd.: Soft Non-tender, obese Ext: No clubbing cyanosis, edema Neuro:  normal strength Skin: No rashes, warm and dry, jaundice Psych: normal mood and behavior   Assessment/Plan  OSA (obstructive sleep apnea) No Issues with CPAP No Down Load as patient did not bring SIM card. Last available down Load from [employer] 07/2013. Plan: Continue on CPAP at bedtime. You appear to be benefiting from the treatment Goal is to wear for at least 4-6 hours each night for maximal clinical benefit. Continue to work on weight loss, as the link between excess weight  and sleep apnea is well established.  Do not drive if sleepy. Follow up with 1 year with Dr. Halford Chessman as long as there are no problems with the down load Please bring SIM card in 06/04/2016 We will place order for new supplies once we have down Load to confirm effectiveness of therapy.  Please contact office for sooner follow up if symptoms do not improve or worsen or seek emergency care .      Magdalen Spatz, NP 06/03/2016  5:13 PM

## 2016-06-03 NOTE — Progress Notes (Signed)
I have reviewed and agree with assessment/plan.  Chesley Mires, MD [facility] Pulmonary/Critical Care 06/03/2016, 6:14 PM Pager:  [phone]

## 2016-06-03 NOTE — Assessment & Plan Note (Addendum)
No Issues with CPAP No Down Load as patient did not bring SIM card. Last available down Load from [employer] 07/2013. Plan: Continue on CPAP at bedtime. You appear to be benefiting from the treatment Goal is to wear for at least 4-6 hours each night for maximal clinical benefit. Continue to work on weight loss, as the link between excess weight  and sleep apnea is well established.  Do not drive if sleepy. Follow up with 1 year with Dr. Halford Chessman as long as there are no problems with the down load Please bring SIM card in 06/04/2016 We will place order for new supplies once we have down Load to confirm effectiveness of therapy.  Please contact office for sooner follow up if symptoms do not improve or worsen or seek emergency care .

## 2016-06-03 NOTE — Patient Instructions (Addendum)
It is nice to meet you today. Continue on CPAP at bedtime. You appear to be benefiting from the treatment Goal is to wear for at least 4-6 hours each night for maximal clinical benefit. Continue to work on weight loss, as the link between excess weight  and sleep apnea is well established.  Do not drive if sleepy. Follow up with 1 year with Dr. Halford Chessman as long as there are no problems with the down load Please bring SIM card in 06/04/2016 We will place order for new supplies once we have down Load to confirm effectiveness of therapy.  Please contact office for sooner follow up if symptoms do not improve or worsen or seek emergency care .

## 2016-06-04 ENCOUNTER — Other Ambulatory Visit: Payer: Self-pay | Admitting: Nurse Practitioner

## 2016-06-04 ENCOUNTER — Telehealth: Payer: Self-pay | Admitting: Pulmonary Disease

## 2016-06-04 DIAGNOSIS — K7469 Other cirrhosis of liver: Secondary | ICD-10-CM

## 2016-06-04 DIAGNOSIS — K831 Obstruction of bile duct: Secondary | ICD-10-CM

## 2016-06-04 NOTE — Telephone Encounter (Signed)
Download received and placed in VS's lookout for review. Nothing further needed.

## 2016-06-08 ENCOUNTER — Other Ambulatory Visit: Payer: Self-pay | Admitting: Internal Medicine

## 2016-06-10 ENCOUNTER — Telehealth: Payer: Self-pay | Admitting: Pulmonary Disease

## 2016-06-10 NOTE — Telephone Encounter (Signed)
Auto CPAP 05/05/16 to 06/03/16 >> used on 30 of 30 nights with average 8 hrs 11 min.  Average AHI 0.7 with median CPAP 6 cm and 95 th percentile CPAP 9 cm H2O   Will have my nurse inform pt that CPAP report shows good control of sleep apnea.

## 2016-06-12 NOTE — Telephone Encounter (Signed)
Results have been explained to patient, pt expressed understanding. Nothing further needed.  

## 2016-06-17 DIAGNOSIS — K831 Obstruction of bile duct: Secondary | ICD-10-CM | POA: Diagnosis not present

## 2016-06-18 ENCOUNTER — Telehealth: Payer: Self-pay

## 2016-06-18 MED ORDER — DIAZEPAM 5 MG PO TABS: 5.0000 mg | ORAL_TABLET | Freq: Once | ORAL | 0 refills | Status: AC

## 2016-06-18 NOTE — Telephone Encounter (Signed)
Pt scheduled for MRI on 06/23/16; pt request med to help pt relax during and prior to MRI. Pt request to be little stronger than last time.pt request cb.Katherina Right.

## 2016-06-18 NOTE — Telephone Encounter (Signed)
Previously precribed 5mg  valium 1/2-1 tablet once. Will Rx 5mg  valium 1-2 tablet once (#2 tablets). plz phone in.

## 2016-06-19 NOTE — Telephone Encounter (Signed)
Rx called in and patient notified.  

## 2016-06-19 NOTE — Telephone Encounter (Signed)
Agreed.  Thanks.  

## 2016-06-23 ENCOUNTER — Ambulatory Visit
Admission: RE | Admit: 2016-06-23 | Discharge: 2016-06-23 | Disposition: A | Discharge status: Home / Self Care | Payer: BLUE CROSS/BLUE SHIELD | Source: Ambulatory Visit | Attending: Nurse Practitioner | Admitting: Nurse Practitioner

## 2016-06-23 DIAGNOSIS — K7460 Unspecified cirrhosis of liver: Secondary | ICD-10-CM | POA: Diagnosis not present

## 2016-06-23 DIAGNOSIS — K831 Obstruction of bile duct: Secondary | ICD-10-CM

## 2016-06-23 DIAGNOSIS — K7469 Other cirrhosis of liver: Secondary | ICD-10-CM

## 2016-06-23 DIAGNOSIS — R935 Abnormal findings on diagnostic imaging of other abdominal regions, including retroperitoneum: Secondary | ICD-10-CM | POA: Diagnosis not present

## 2016-06-23 MED ORDER — GADOXETATE DISODIUM 0.25 MMOL/ML IV SOLN
9.0000 mL | Freq: Once | INTRAVENOUS | Status: AC | PRN
  Administered 2016-06-23: 9 mL via INTRAVENOUS

## 2016-06-26 DIAGNOSIS — K7469 Other cirrhosis of liver: Secondary | ICD-10-CM | POA: Diagnosis not present

## 2016-07-02 ENCOUNTER — Other Ambulatory Visit: Payer: Self-pay | Admitting: Family Medicine

## 2016-07-02 NOTE — Telephone Encounter (Signed)
Sent.  Thanks.  Okay to hold off on CPE for now given other ongoing issues.

## 2016-07-02 NOTE — Telephone Encounter (Signed)
Faxed refill request. Last office visit:   02/07/2015 CPE, one acute OV since.   Last Filled:    90 tablet 3 02/11/2015  Please advise.

## 2016-07-06 DIAGNOSIS — K7290 Hepatic failure, unspecified without coma: Secondary | ICD-10-CM | POA: Diagnosis not present

## 2016-07-06 DIAGNOSIS — K830 Cholangitis: Secondary | ICD-10-CM | POA: Diagnosis not present

## 2016-07-06 DIAGNOSIS — K7469 Other cirrhosis of liver: Secondary | ICD-10-CM | POA: Diagnosis not present

## 2016-07-29 DIAGNOSIS — G4733 Obstructive sleep apnea (adult) (pediatric): Secondary | ICD-10-CM | POA: Diagnosis not present

## 2016-08-06 DIAGNOSIS — K7469 Other cirrhosis of liver: Secondary | ICD-10-CM | POA: Diagnosis not present

## 2016-08-06 DIAGNOSIS — K7290 Hepatic failure, unspecified without coma: Secondary | ICD-10-CM | POA: Diagnosis not present

## 2016-08-06 DIAGNOSIS — K831 Obstruction of bile duct: Secondary | ICD-10-CM | POA: Diagnosis not present

## 2016-08-06 DIAGNOSIS — K830 Cholangitis: Secondary | ICD-10-CM | POA: Diagnosis not present

## 2016-08-09 ENCOUNTER — Telehealth: Payer: Self-pay | Admitting: Family Medicine

## 2016-08-09 NOTE — Telephone Encounter (Signed)
Call patient. Note from liver clinic reviewed. They raised the reasonable consideration of changing his amlodipine to carvedilol. I think this is a good idea. Please see if patient is willing to make the change. Have him check his blood pressure and pulse and let me know what his readings are. Carvedilol may slow his heart rate some and if he already has a relatively low pulse rate that may limit how much carvedilol he can take. Please let me know and we will go from there. Thanks.

## 2016-08-10 NOTE — Telephone Encounter (Signed)
Patient advised and is willing to switch over.  Patient says he will get some BP and pulse readings and get them in to [place] for review and then await Dr. Buckner Malta decision.

## 2016-08-11 NOTE — Telephone Encounter (Signed)
Will await readings.

## 2016-08-11 NOTE — Telephone Encounter (Signed)
Noted, will await BP and pulse readings for possible change from amlodipine to carvedilol.  Thanks.

## 2016-09-22 ENCOUNTER — Other Ambulatory Visit: Payer: Self-pay | Admitting: Family Medicine

## 2016-10-07 ENCOUNTER — Encounter: Payer: Self-pay | Admitting: Internal Medicine

## 2016-10-23 NOTE — Progress Notes (Signed)
Cardiology Office Note   Date:  10/30/2016   ID:  [name], DOB Jun 22, 1960, MRN [id number]  PCP:  [name], MD  Cardiologist:   Dorris Carnes, MD   F/U of aortic stenosis.     History of Present Illness: [name] is a 56 y.o. male with a history of aortic stenosis.  I saw her in 2015 Echo in Dec 2016 Mean gradient was 31 mm Hg  LVEF 60 to 65%  Liver problems earlier in year  Being followed by GI    At the time he had to use his inhaler  Breathing was bad  It is better now.      Patient has had some dizzy spells since he was young   [name] was working outside  Was dizzy  Almost didn't makeit to truck  Recovered   Had one a little later  [name] was worst  Has not infrequently other spells but milder.  COmmon    No CP  No PND  Uses CPAP   Current Meds  Medication Sig  . amLODipine (NORVASC) 10 MG tablet TAKE ONE-HALF TO ONE WHOLE TABLET BY MOUTH ONCE A DAY.  [name] aspirin EC 81 MG tablet Take 81 mg by mouth daily.  [name] azelastine (ASTELIN) 0.1 % nasal spray PLACE 2 SPRAYS INTO BOTH NOSTRILS TWICE DAILY  . cholestyramine light (PREVALITE) 4 g packet Take 1 packet (4 g total) by mouth 2 (two) times daily.  . cholestyramine light (PREVALITE) 4 GM/DOSE powder DISSOLVE 1 SCOOP IN LIQUID AND TAKE BY MOUTH TWICE DAILY  . fluticasone (FLONASE) 50 MCG/ACT nasal spray PLACE ONE OR TWO SPRAYS INTO BOTH NOSTRILS DAILY AS NEEDED.  . fluticasone (FLOVENT HFA) 110 MCG/ACT inhaler Inhale 2 puffs into the lungs 2 (two) times daily. Rinse after use.  . ibuprofen (ADVIL,MOTRIN) 200 MG tablet Take 200 mg by mouth daily as needed for headache.  . lactulose (CHRONULAC) 10 GM/15ML solution Take 10 g by mouth 2 (two) times daily.   [name] levothyroxine (SYNTHROID, LEVOTHROID) 137 MCG tablet Take 1 tablet (137 mcg total) by mouth daily before breakfast.  . milk thistle 175 MG tablet Take 175 mg by mouth daily.  . Omega-3 Fatty Acids (FISH OIL PO) Take 1 capsule by mouth 3 (three) times a week.    [name] omeprazole (PRILOSEC) 20 MG capsule Take 20 mg by mouth daily.  . VENTOLIN HFA 108 (90 Base) MCG/ACT inhaler INHALE 2 PUFFS INTO THE LUNGS EVERY 6 HOURS AS NEEDED.     Allergies:   Watermelon flavor   Past Medical History:  Diagnosis Date  . Allergy   . Asthma   . Bicuspid aortic valve    sees dr Harrington Challenger  . Fatty liver    with h/o elevated LFT's  . GERD (gastroesophageal reflux disease)   . Heart murmur   . Hypertension   . Itching    all over last few months  . Jaundice last several months  . OSA (obstructive sleep apnea) 09/14/2011   PSG 11/08/11>>AHI 31.6, SpO2 low 85%. wears CPAP, pt does not know settings  . Other abnormal glucose    diet controlled diabetic  . Thyroid disease     Past Surgical History:  Procedure Laterality Date  . APPENDECTOMY  10/09   Emergency  . BIOPSY THYROID  08/19/07   Attempted, no tissue obtained  . CARDIOVASCULAR STRESS TEST  03/07   Negative 06/05  . DOPPLER ECHOCARDIOGRAPHY  06/04  . ESOPHAGEAL BANDING  N/A 05/04/2016   Procedure: ESOPHAGEAL BANDING;  Surgeon: [name], MD;  Location: [facility];  Service: Endoscopy;  Laterality: N/A;  . ESOPHAGOGASTRODUODENOSCOPY (EGD) WITH PROPOFOL N/A 05/04/2016   Procedure: ESOPHAGOGASTRODUODENOSCOPY (EGD) WITH PROPOFOL;  Surgeon: [name], MD;  Location: [facility];  Service: Endoscopy;  Laterality: N/A;     Social History:  The patient  reports that he has never smoked. He has never used smokeless tobacco. He reports that he does not drink alcohol or use drugs.   Family History:  The patient's family history includes Asthma in his mother; COPD in his father; Cancer in his father; Colon cancer in his maternal grandmother; Heart disease in his maternal grandfather and sister; Prostate cancer in his maternal uncle.    ROS:  Please see the history of present illness. All other systems are reviewed and  Negative to the above problem except as noted.    PHYSICAL EXAM: VS:  BP 122/74    Pulse 60   Ht 5\' 9"  (1.753 m)   Wt 183 lb 6.4 oz (83.2 kg)   BMI 27.08 kg/m   GEN: Well nourished, well developed, in no acute distress  HEENT: normal  Neck: no JVD, carotid bruits, or masses Cardiac: RRR  Gr III/VI systolic murmur base  , rubs, or gallops,no edema  Respiratory:  clear to auscultation bilaterally, normal work of breathing GI: soft, nontender, nondistended, + BS  No hepatomegaly  MS: no deformity Moving all extremities   Skin: warm and dry, no rash Neuro:  Strength and sensation are intact Psych: euthymic mood, full affect   EKG:  EKG is ordered today.  SR 60 pm    Lipid Panel    Component Value Date/Time   CHOL 203 (H) 02/04/2015 0817   TRIG 145.0 02/04/2015 0817   HDL 49.60 02/04/2015 0817   CHOLHDL 4 02/04/2015 0817   VLDL 29.0 02/04/2015 0817   LDLCALC 124 (H) 02/04/2015 0817   LDLDIRECT 196.0 08/02/2012 0759      Wt Readings from Last 3 Encounters:  10/30/16 183 lb 6.4 oz (83.2 kg)  06/03/16 191 lb 6.4 oz (86.8 kg)  05/12/16 192 lb (87.1 kg)      ASSESSMENT AND PLAN:  1  Aortic stenosis   Murmur sugg it is at least moderate  Concerning about spells of dizziness though had has had intermitt through the years   ? If worsening  I would recomm an echo to reeval AV   Avoid dehydration  Take activity as tolerated    Will set f/u for the spring     Current medicines are reviewed at length with the patient today.  The patient does not have concerns regarding medicines.  Signed, Dorris Carnes, MD  10/30/2016 8:12 AM    [facility] [address], [place], [place]  [address] Phone: [phone]; Fax: [phone]

## 2016-10-30 ENCOUNTER — Encounter: Payer: Self-pay | Admitting: Internal Medicine

## 2016-10-30 ENCOUNTER — Ambulatory Visit (INDEPENDENT_AMBULATORY_CARE_PROVIDER_SITE_OTHER): Payer: BLUE CROSS/BLUE SHIELD | Admitting: Internal Medicine

## 2016-10-30 VITALS — BP 122/74 | HR 60 | Ht 69.0 in | Wt 183.4 lb

## 2016-10-30 DIAGNOSIS — I350 Nonrheumatic aortic (valve) stenosis: Secondary | ICD-10-CM | POA: Diagnosis not present

## 2016-10-30 NOTE — Patient Instructions (Addendum)
Your physician recommends that you continue on your current medications as directed. Please refer to the Current Medication list given to you today.  Your physician has requested that you have an echocardiogram. Echocardiography is a painless test that uses sound waves to create images of your heart. It provides your doctor with information about the size and shape of your heart and how well your heart's chambers and valves are working. This procedure takes approximately one hour. There are no restrictions for this procedure.  Your physician wants you to follow-up in: June, 2019.  You will receive a reminder letter in the mail two months in advance. If you don't receive a letter, please call our office to schedule the follow-up appointment.

## 2016-11-03 ENCOUNTER — Ambulatory Visit (HOSPITAL_COMMUNITY): Payer: BLUE CROSS/BLUE SHIELD | Attending: Cardiology

## 2016-11-03 ENCOUNTER — Other Ambulatory Visit: Payer: Self-pay

## 2016-11-03 DIAGNOSIS — I10 Essential (primary) hypertension: Secondary | ICD-10-CM | POA: Diagnosis not present

## 2016-11-03 DIAGNOSIS — E119 Type 2 diabetes mellitus without complications: Secondary | ICD-10-CM | POA: Insufficient documentation

## 2016-11-03 DIAGNOSIS — I350 Nonrheumatic aortic (valve) stenosis: Secondary | ICD-10-CM | POA: Insufficient documentation

## 2016-12-19 ENCOUNTER — Telehealth: Payer: Self-pay | Admitting: Family Medicine

## 2016-12-19 DIAGNOSIS — Z125 Encounter for screening for malignant neoplasm of prostate: Secondary | ICD-10-CM

## 2016-12-19 DIAGNOSIS — I10 Essential (primary) hypertension: Secondary | ICD-10-CM

## 2016-12-19 NOTE — Telephone Encounter (Signed)
Orders labs.  Thanks.

## 2016-12-19 NOTE — Telephone Encounter (Signed)
Copied from [place] [id number]. Topic: Appointment Scheduling - Scheduling Inquiry for Clinic >> Dec 17, 2016  9:29 AM Arletha Grippe wrote: Reason for CRM: pt has cpe scheduled on 11/27.  Labs need to be ordered ans scheduled. Pt would like as early as possible. Thanks

## 2016-12-21 NOTE — Telephone Encounter (Signed)
This came back to me.  Let me know if there are any outstanding issues.  Labs ordered.  Notify pt.  Thanks.

## 2017-01-07 DIAGNOSIS — K7469 Other cirrhosis of liver: Secondary | ICD-10-CM | POA: Diagnosis not present

## 2017-01-07 DIAGNOSIS — K7290 Hepatic failure, unspecified without coma: Secondary | ICD-10-CM | POA: Diagnosis not present

## 2017-01-07 DIAGNOSIS — K831 Obstruction of bile duct: Secondary | ICD-10-CM | POA: Diagnosis not present

## 2017-01-07 DIAGNOSIS — K8309 Other cholangitis: Secondary | ICD-10-CM | POA: Diagnosis not present

## 2017-01-08 ENCOUNTER — Other Ambulatory Visit: Payer: Self-pay | Admitting: Nurse Practitioner

## 2017-01-08 DIAGNOSIS — K7469 Other cirrhosis of liver: Secondary | ICD-10-CM

## 2017-01-11 ENCOUNTER — Other Ambulatory Visit (INDEPENDENT_AMBULATORY_CARE_PROVIDER_SITE_OTHER): Payer: BLUE CROSS/BLUE SHIELD

## 2017-01-11 DIAGNOSIS — I10 Essential (primary) hypertension: Secondary | ICD-10-CM | POA: Diagnosis not present

## 2017-01-11 DIAGNOSIS — Z125 Encounter for screening for malignant neoplasm of prostate: Secondary | ICD-10-CM | POA: Diagnosis not present

## 2017-01-11 LAB — COMPREHENSIVE METABOLIC PANEL
ALT: 74 U/L — ABNORMAL HIGH (ref 0–53)
AST: 87 U/L — ABNORMAL HIGH (ref 0–37)
Albumin: 4.1 g/dL (ref 3.5–5.2)
Alkaline Phosphatase: 231 U/L — ABNORMAL HIGH (ref 39–117)
BUN: 15 mg/dL (ref 6–23)
CO2: 25 mEq/L (ref 19–32)
Calcium: 9.3 mg/dL (ref 8.4–10.5)
Chloride: 105 mEq/L (ref 96–112)
Creatinine, Ser: 0.67 mg/dL (ref 0.40–1.50)
GFR: 131.09 mL/min (ref >60.00)
Glucose, Bld: 81 mg/dL (ref 70–99)
Potassium: 3.9 mEq/L (ref 3.5–5.1)
Sodium: 136 mEq/L (ref 135–145)
Total Bilirubin: 3.5 mg/dL — ABNORMAL HIGH (ref 0.2–1.2)
Total Protein: 7.8 g/dL (ref 6.0–8.3)

## 2017-01-11 LAB — CBC WITH DIFFERENTIAL/PLATELET
Basophils Absolute: 0.1 10*3/uL (ref 0.0–0.1)
Basophils Relative: 1 % (ref 0.0–3.0)
Eosinophils Absolute: 0.2 10*3/uL (ref 0.0–0.7)
Eosinophils Relative: 1.5 % (ref 0.0–5.0)
HCT: 43.7 % (ref 39.0–52.0)
Hemoglobin: 14.3 g/dL (ref 13.0–17.0)
Lymphocytes Relative: 23.7 % (ref 12.0–46.0)
Lymphs Abs: 2.9 10*3/uL (ref 0.7–4.0)
MCHC: 32.9 g/dL (ref 30.0–36.0)
MCV: 94.8 fl (ref 78.0–100.0)
Monocytes Absolute: 1 10*3/uL (ref 0.1–1.0)
Monocytes Relative: 8 % (ref 3.0–12.0)
Neutro Abs: 8.1 10*3/uL — ABNORMAL HIGH (ref 1.4–7.7)
Neutrophils Relative %: 65.8 % (ref 43.0–77.0)
Platelets: 195 10*3/uL (ref 150.0–400.0)
RBC: 4.61 Mil/uL (ref 4.22–5.81)
RDW: 14.8 % (ref 11.5–15.5)
WBC: 12.3 10*3/uL — ABNORMAL HIGH (ref 4.0–10.5)

## 2017-01-11 LAB — LIPID PANEL
Cholesterol: 195 mg/dL (ref 0–200)
HDL: 25 mg/dL — ABNORMAL LOW (ref >39.00)
LDL Cholesterol: 131 mg/dL — ABNORMAL HIGH (ref 0–99)
NonHDL: 169.87
Total CHOL/HDL Ratio: 8
Triglycerides: 194 mg/dL — ABNORMAL HIGH (ref 0.0–149.0)
VLDL: 38.8 mg/dL (ref 0.0–40.0)

## 2017-01-11 LAB — PSA: PSA: 0.5 ng/mL (ref 0.10–4.00)

## 2017-01-11 LAB — TSH: TSH: 1.59 u[IU]/mL (ref 0.35–4.50)

## 2017-01-12 ENCOUNTER — Other Ambulatory Visit: Payer: BLUE CROSS/BLUE SHIELD

## 2017-01-19 ENCOUNTER — Ambulatory Visit (INDEPENDENT_AMBULATORY_CARE_PROVIDER_SITE_OTHER): Payer: BLUE CROSS/BLUE SHIELD | Admitting: Family Medicine

## 2017-01-19 ENCOUNTER — Encounter: Payer: Self-pay | Admitting: Family Medicine

## 2017-01-19 VITALS — BP 130/80 | HR 63 | Temp 98.5°F | Ht 69.0 in | Wt 183.5 lb

## 2017-01-19 DIAGNOSIS — R7989 Other specified abnormal findings of blood chemistry: Secondary | ICD-10-CM

## 2017-01-19 DIAGNOSIS — Q231 Congenital insufficiency of aortic valve: Secondary | ICD-10-CM

## 2017-01-19 DIAGNOSIS — Z23 Encounter for immunization: Secondary | ICD-10-CM | POA: Diagnosis not present

## 2017-01-19 DIAGNOSIS — R945 Abnormal results of liver function studies: Secondary | ICD-10-CM

## 2017-01-19 DIAGNOSIS — Z0000 Encounter for general adult medical examination without abnormal findings: Secondary | ICD-10-CM | POA: Diagnosis not present

## 2017-01-19 DIAGNOSIS — J45909 Unspecified asthma, uncomplicated: Secondary | ICD-10-CM

## 2017-01-19 DIAGNOSIS — I10 Essential (primary) hypertension: Secondary | ICD-10-CM

## 2017-01-19 DIAGNOSIS — Z7189 Other specified counseling: Secondary | ICD-10-CM

## 2017-01-19 NOTE — Patient Instructions (Signed)
Don't change your meds for now.  Let me think about the BP med adjustment and we'll be in touch.  Take care.  Glad to see you.

## 2017-01-19 NOTE — Progress Notes (Signed)
CPE- See plan.  Routine anticipatory guidance given to patient.  See health maintenance.  The possibility exists that previously documented standard health maintenance information may have been brought forward from a previous encounter into this note.  If needed, that same information has been updated to reflect the current situation based on today's encounter.    Tetanus 2010 Flu shot 2018 Shingles and PNA shot not due yet. D/w pt.  FH prostate cancer- PSA wnl.  FH colon cancer. Colonoscopy wnl 2014 Living will d/w pt. Wife and his daughter Marye Round would be designated equally.  HCV and HIV screening prev done.  He isn't depressed, d/w pt.  He has sig concerns with his health but neither he nor I would classify that as depression.  No SI/HI.    We talked about change to carvedilol from 10mg  amlodipine re: his liver considerations.  His pulse is usually in the 60s. He has abd u/s pending.  He has had f/u with the liver clinic.  He has considered applying for disability.  D/w pt.  This usually comes through the social security/disability office.    He has had mild cold sx recently.  No fevers.  Off flovent.    Bicuspid aortic valve.  He has f/u with cards pending. Still with occ lightheaded symptoms.  No syncope.    PMH and SH reviewed  Meds, vitals, and allergies reviewed.   ROS: Per HPI.  Unless specifically indicated otherwise in HPI, the patient denies:  General: fever. Eyes: acute vision changes ENT: sore throat Cardiovascular: chest pain Respiratory: SOB GI: vomiting GU: dysuria Musculoskeletal: acute back pain Derm: acute rash Neuro: acute motor dysfunction Psych: worsening mood Endocrine: polydipsia Heme: bleeding Allergy: hayfever  GEN: nad, alert and oriented HEENT: mucous membranes moist NECK: supple w/o LA CV: rrr. Murmur noted, SEM PULM: ctab, no inc wob ABD: soft, +bs EXT: no BLE edema SKIN: no acute rash

## 2017-01-21 NOTE — Assessment & Plan Note (Signed)
On pred per liver clinic, off flovent.  Continue as is.  Ctab.

## 2017-01-21 NOTE — Assessment & Plan Note (Signed)
Per cards  

## 2017-01-21 NOTE — Assessment & Plan Note (Signed)
Living will d/w pt.  Wife and his daughter Brittany would be designated equally.   

## 2017-01-21 NOTE — Assessment & Plan Note (Signed)
We talked about change to carvedilol from 10mg  amlodipine re: his liver considerations.  His pulse is usually in the 60s. I'll check with cardiology in the meantime.  We didn't change his meds yet.

## 2017-01-21 NOTE — Assessment & Plan Note (Signed)
Per liver clinic.  See above.  No jaundice.

## 2017-01-21 NOTE — Assessment & Plan Note (Signed)
Tetanus 2010 Flu shot 2018 Shingles and PNA shot not due yet. D/w pt.  FH prostate cancer- PSA wnl.  FH colon cancer. Colonoscopy wnl 2014 Living will d/w pt. Wife and his daughter Marye Round would be designated equally.  HCV and HIV screening prev done.

## 2017-01-22 ENCOUNTER — Ambulatory Visit
Admission: RE | Admit: 2017-01-22 | Discharge: 2017-01-22 | Disposition: A | Discharge status: Home / Self Care | Payer: BLUE CROSS/BLUE SHIELD | Source: Ambulatory Visit | Attending: Nurse Practitioner | Admitting: Nurse Practitioner

## 2017-01-22 DIAGNOSIS — K7460 Unspecified cirrhosis of liver: Secondary | ICD-10-CM | POA: Diagnosis not present

## 2017-01-22 DIAGNOSIS — K7469 Other cirrhosis of liver: Secondary | ICD-10-CM

## 2017-01-25 ENCOUNTER — Telehealth: Payer: Self-pay | Admitting: Family Medicine

## 2017-01-25 MED ORDER — CARVEDILOL 3.125 MG PO TABS: 3.1250 mg | ORAL_TABLET | Freq: Two times a day (BID) | ORAL | 3 refills | Status: DC

## 2017-01-25 MED ORDER — AMLODIPINE BESYLATE 10 MG PO TABS: ORAL_TABLET | ORAL | Status: DC

## 2017-01-25 NOTE — Telephone Encounter (Signed)
Patient advised and voiced understanding.  

## 2017-01-25 NOTE — Telephone Encounter (Signed)
Call pt.  Have him cut his amlodipine back to 5mg  for 5 days.  Then have him stop the med.  At that point, add on carvedilol 3.125mg  tabs BID.  If pulse is <55 with the new med, then stop it totally, restart amlodipine the next day.   Update me in about 1 week about this BP/pulse/how he feels.   Thanks.  rx sent.

## 2017-01-26 DIAGNOSIS — R932 Abnormal findings on diagnostic imaging of liver and biliary tract: Secondary | ICD-10-CM | POA: Diagnosis not present

## 2017-01-26 DIAGNOSIS — K8309 Other cholangitis: Secondary | ICD-10-CM | POA: Diagnosis not present

## 2017-01-26 DIAGNOSIS — K7460 Unspecified cirrhosis of liver: Secondary | ICD-10-CM | POA: Diagnosis not present

## 2017-01-28 ENCOUNTER — Other Ambulatory Visit: Payer: Self-pay | Admitting: Nurse Practitioner

## 2017-01-28 DIAGNOSIS — K7460 Unspecified cirrhosis of liver: Secondary | ICD-10-CM

## 2017-02-02 ENCOUNTER — Ambulatory Visit
Admission: RE | Admit: 2017-02-02 | Discharge: 2017-02-02 | Disposition: A | Discharge status: Home / Self Care | Payer: BLUE CROSS/BLUE SHIELD | Source: Ambulatory Visit | Attending: Nurse Practitioner | Admitting: Nurse Practitioner

## 2017-02-02 DIAGNOSIS — K7460 Unspecified cirrhosis of liver: Secondary | ICD-10-CM

## 2017-02-02 MED ORDER — IOPAMIDOL (ISOVUE-300) INJECTION 61%
100.0000 mL | Freq: Once | INTRAVENOUS | Status: AC | PRN
  Administered 2017-02-02: 100 mL via INTRAVENOUS

## 2017-02-08 DIAGNOSIS — K8309 Other cholangitis: Secondary | ICD-10-CM | POA: Diagnosis not present

## 2017-02-11 DIAGNOSIS — G4733 Obstructive sleep apnea (adult) (pediatric): Secondary | ICD-10-CM | POA: Diagnosis not present

## 2017-02-12 ENCOUNTER — Telehealth: Payer: Self-pay | Admitting: Family Medicine

## 2017-02-12 ENCOUNTER — Ambulatory Visit: Payer: Self-pay | Admitting: *Deleted

## 2017-02-12 MED ORDER — CARVEDILOL 3.125 MG PO TABS: 3.1250 mg | ORAL_TABLET | Freq: Two times a day (BID) | ORAL | Status: DC

## 2017-02-12 NOTE — Telephone Encounter (Signed)
Pt called to give readings of his b/ps. In the mornings, his systolic is usually 912'Q-583'M and diastolic is in the 62'T. In the afternoon his systolic is 947'X - 252'V and diastolic ranges from  12-92.  He sometimes will have a dull headache in the morning and usually goes away as the day goes on.  Advised to continue to take his b/p med and let us know for any problems.

## 2017-02-12 NOTE — Telephone Encounter (Signed)
Patient notified as instructed by telephone and verbalized understanding. Patient stated that he stopped taking the Amlodipine about a week and a half ago. Patient stated that he is taking Carvedilol 3.125 two times a day. Patient stated that the lowest his heart rate has been was 53 and the highest 70. Patient stated that his pulse usually is in the 60's.

## 2017-02-12 NOTE — Telephone Encounter (Signed)
Patient notified as instructed by telephone and verbalized understanding. 

## 2017-02-12 NOTE — Telephone Encounter (Signed)
Please verify his total daily current dose of amlodipine and carvedilol (he was changing from the former to the latter).  Let me know about his pulse.  We may still need to adjust his carvedilol dose.  Thanks.

## 2017-02-12 NOTE — Telephone Encounter (Signed)
Many thanks.  See if he can take 1.5 tabs twice a day.  He may want to increase slowly, taking 1.5 tabs in the AM and 1 tab at night for a few days.  See if that helps his BP stay a few points lower and helps the headache.  If his pulse stays >50 and his BP isn't dropping enough, he could increase to 1.5 tabs BID at that point. Update [place] as needed.  Thanks.

## 2017-02-12 NOTE — Addendum Note (Signed)
Addended by: [name] on: 02/12/2017 12:09 PM   Modules accepted: Orders

## 2017-02-25 ENCOUNTER — Encounter (HOSPITAL_COMMUNITY): Payer: Self-pay | Admitting: Emergency Medicine

## 2017-02-25 ENCOUNTER — Ambulatory Visit: Payer: Self-pay | Admitting: *Deleted

## 2017-02-25 ENCOUNTER — Ambulatory Visit (HOSPITAL_COMMUNITY)
Admission: EM | Admit: 2017-02-25 | Discharge: 2017-02-25 | Disposition: A | Discharge status: Home / Self Care | Payer: BLUE CROSS/BLUE SHIELD | Attending: Family Medicine | Admitting: Family Medicine

## 2017-02-25 DIAGNOSIS — I10 Essential (primary) hypertension: Secondary | ICD-10-CM | POA: Diagnosis not present

## 2017-02-25 NOTE — ED Triage Notes (Signed)
PT reports he changed BP meds 12/10 and he has been hypertensive since change. PT reports he has had a "colored square" in his visual field intermittently for 1-2 weeks. PT reports area is small and moves around visual field like a floater.

## 2017-02-25 NOTE — Telephone Encounter (Signed)
Attempted  To  Make  Appointment  With pcp today   No  Availability  Advised  To  Go  To [facility] tonight  For  evaul  Of  Visual  Disturbances  /  Htn      Reason for Disposition . [1] Blurred vision or visual changes AND [2] present now AND [3] sudden onset or new (e.g., minutes, hours, days)  (Exception: seeing floaters / black specks OR previously diagnosed migraine headaches with same symptoms)  Answer Assessment - Initial Assessment Questions 1. DESCRIPTION: "What is the vision loss like? Describe it for me." (e.g., complete vision loss, blurred vision, double vision, floaters, etc.)     NOTICING   SMALL  SQUARE  COLORED    BOX  IN  VISION   SOME  BLURRED  VISION AS   WELL   2. LOCATION: "One or both eyes?" If one, ask: "Which eye?"     R  EYE     3. SEVERITY: "Can you see anything?" If so, ask: "What can you see?" (e.g., fine print)     CAN  READ A  SMALL  RECEIPT    WITH HIS  READING  GLASSES   4. ONSET: "When did this begin?" "Did it start suddenly or has this been gradual?"     SYMPTOMS  OFF AND  ON  WORSE  TODAY  SYMPTOMS   X   SEVERAL  WEEKS  AGO    5. PATTERN: "Does this come and go, or has it been constant since it started?"     COMES  AND  GOES  WORSE  TODAY     6. PAIN: "Is there any pain in your eye(s)?"  (Scale 1-10; or mild, moderate, severe)     NO  PAIN  FEEL  HEAVY    7. CONTACTS-GLASSES: "Do you wear contacts or glasses?"      READING  GLASSES   8. CAUSE: "What do you think is causing this visual problem?"       NO  9. OTHER SYMPTOMS: "Do you have any other symptoms?" (e.g., confusion, headache, arm or leg weakness, speech problems)       Headache  In  Am   bp  Was approx  158/98   This  Am    And   Was   approx  3   Weeks    Medication  Changes   Were  Made  At that  Time    10. PREGNANCY: "Is there any chance you are pregnant?" "When was your last menstrual period?"  N/a  Protocols used: [place]

## 2017-02-25 NOTE — ED Provider Notes (Signed)
[place]    CSN: [id number] Arrival date & time: 02/25/17  1701     History   Chief Complaint Chief Complaint  Patient presents with  . Visual Field Change  . Hypertension    HPI [name] is a 57 y.o. male.   57 yo male with chronic HTN and liver cirrhosis here for elevated blood pressure since stopping amlodipine and starting carvedilol. Currently taking carvedilol 3.125 mg, 1.5 tab in morning and 1 tab at night. Patient complains of headache since starting carvedilol that usually occurs in the morning. He also has a floater in his vision that comes and goes. Denies other visual changes. Says blood pressure at home is usually 150s-160s/90s.       Past Medical History:  Diagnosis Date  . Allergy   . Asthma   . Bicuspid aortic valve    sees dr Harrington Challenger  . Fatty liver    with h/o elevated LFT's  . GERD (gastroesophageal reflux disease)   . Heart murmur   . Hypertension   . Itching    all over last few months  . Jaundice   . OSA (obstructive sleep apnea) 09/14/2011   PSG 11/08/11>>AHI 31.6, SpO2 low 85%. wears CPAP, pt does not know settings  . Other abnormal glucose    diet controlled diabetic  . Thyroid disease     Patient Active Problem List   Diagnosis Date Noted  . Cirrhosis of liver without ascites ([place])   . Mucosal abnormality of stomach   . Advance care planning 02/12/2015  . Flying phobia 09/18/2013  . LFT elevation 08/14/2012  . Umbilical hernia 12/20/2534  . Asthma 09/14/2011  . OSA (obstructive sleep apnea) 09/14/2011  . FH: prostate cancer 02/25/2011  . Routine general medical examination at a health care facility 02/25/2011  . Bicuspid aortic valve 02/25/2011  . Hyperglycemia 08/29/2007  . PURE HYPERCHOLESTEROLEMIA 02/14/2007  . LOSS, HEARING NOS 08/11/2006  . Hypothyroidism 08/09/2006  . Essential hypertension 08/09/2006  . ALLERGIC RHINITIS 08/09/2006    Past Surgical History:  Procedure Laterality Date  . APPENDECTOMY   10/09   Emergency  . BIOPSY THYROID  08/19/07   Attempted, no tissue obtained  . CARDIOVASCULAR STRESS TEST  03/07   Negative 06/05  . DOPPLER ECHOCARDIOGRAPHY  06/04  . ESOPHAGEAL BANDING N/A 05/04/2016   Procedure: ESOPHAGEAL BANDING;  Surgeon: [name], MD;  Location: [facility];  Service: Endoscopy;  Laterality: N/A;  . ESOPHAGOGASTRODUODENOSCOPY (EGD) WITH PROPOFOL N/A 05/04/2016   Procedure: ESOPHAGOGASTRODUODENOSCOPY (EGD) WITH PROPOFOL;  Surgeon: [name], MD;  Location: [facility];  Service: Endoscopy;  Laterality: N/A;       Home Medications    Prior to Admission medications   Medication Sig Start Date End Date Taking? Authorizing Provider  carvedilol (COREG) 3.125 MG tablet Take 1-1.5 tablets (3.125-4.6875 mg total) by mouth 2 (two) times daily with a meal. 02/12/17  Yes [name], MD  cholestyramine light (PREVALITE) 4 GM/DOSE powder DISSOLVE 1 SCOOP IN LIQUID AND TAKE BY MOUTH TWICE DAILY 06/08/16  Yes [name], MD  lactulose (CHRONULAC) 10 GM/15ML solution Take 10 g by mouth 2 (two) times daily.  09/24/16  Yes [provider]  levothyroxine (SYNTHROID, LEVOTHROID) 137 MCG tablet Take 1 tablet (137 mcg total) by mouth daily before breakfast. 05/10/16  Yes [name], MD  milk thistle 175 MG tablet Take 175 mg by mouth daily.   Yes [provider]  Multiple Vitamin (  MULTIVITAMIN) tablet Take 1 tablet by mouth daily.   Yes [provider]  Omega-3 Fatty Acids (FISH OIL PO) Take 1 capsule by mouth 3 (three) times a week.   Yes [provider]  predniSONE (DELTASONE) 10 MG tablet Take 20 mg by mouth daily with breakfast.   Yes [provider]  ursodiol (ACTIGALL) 500 MG tablet Take 500 mg by mouth 2 (two) times daily.   Yes [provider]  zinc gluconate 50 MG tablet Take 50 mg by mouth daily.   Yes [provider]  azelastine (ASTELIN) 0.1 % nasal spray PLACE 2 SPRAYS INTO BOTH  NOSTRILS TWICE DAILY 09/22/16   [name], MD  fluticasone (FLONASE) 50 MCG/ACT nasal spray PLACE ONE OR TWO SPRAYS INTO BOTH NOSTRILS DAILY AS NEEDED. Patient not taking: Reported on 01/19/2017 09/22/16   [name], MD  fluticasone [facility] HFA) 110 MCG/ACT inhaler Inhale 2 puffs into the lungs 2 (two) times daily. Rinse after use. Patient not taking: Reported on 01/19/2017 04/09/16   [name], MD  ibuprofen (ADVIL,MOTRIN) 200 MG tablet Take 200 mg by mouth daily as needed for headache.    [provider]  omeprazole (PRILOSEC) 20 MG capsule Take 20 mg by mouth daily.    [provider]  VENTOLIN HFA 108 (90 Base) MCG/ACT inhaler INHALE 2 PUFFS INTO THE LUNGS EVERY 6 HOURS AS NEEDED. 09/22/16   [name], MD    Family History Family History  Problem Relation Age of Onset  . Asthma Mother   . Cancer Father        Died when pt was 63 of CA with mets, site unknown, COPD  . COPD Father   . Heart disease Sister        Heart stopped  . Prostate cancer Maternal Uncle   . Colon cancer Maternal Grandmother   . Heart disease Maternal Grandfather        MI, 6 YOA  . Esophageal cancer Neg Hx   . Rectal cancer Neg Hx   . Stomach cancer Neg Hx     Social History Social History   Tobacco Use  . Smoking status: Never Smoker  . Smokeless tobacco: Never Used  Substance Use Topics  . Alcohol use: No  . Drug use: No     Allergies   Watermelon flavor   Review of Systems Review of Systems  Constitutional: Negative for activity change and appetite change.  HENT: Negative for congestion and ear discharge.   Eyes: Positive for visual disturbance. Negative for discharge and itching.  Respiratory: Negative for apnea and chest tightness.   Cardiovascular: Negative for chest pain and leg swelling.  Gastrointestinal: Negative for abdominal pain and nausea.  Endocrine: Negative for cold intolerance and heat intolerance.  Genitourinary: Negative for  dysuria and flank pain.  Musculoskeletal: Negative for arthralgias and back pain.  Neurological: Positive for headaches. Negative for dizziness.  Hematological: Negative for adenopathy. Does not bruise/bleed easily.     Physical Exam Triage Vital Signs ED Triage Vitals  Enc Vitals Group     BP 02/25/17 1739 (!) 155/95     Pulse Rate 02/25/17 1739 (!) 57     Resp 02/25/17 1739 16     Temp 02/25/17 1739 98.3 F (36.8 C)     Temp Source 02/25/17 1739 Oral     SpO2 02/25/17 1739 100 %     Weight 02/25/17 1741 183 lb (83 kg)     Height  02/25/17 1741 5\' 8"  (1.727 m)     Head Circumference --      Peak Flow --      Pain Score 02/25/17 1742 0     Pain Loc --      Pain Edu? --      Excl. in [place]? --    No data found.  Updated Vital Signs BP (!) 155/95   Pulse (!) 57   Temp 98.3 F (36.8 C) (Oral)   Resp 16   Ht 5\' 8"  (1.727 m)   Wt 183 lb (83 kg)   SpO2 100%   BMI 27.83 kg/m   Visual Acuity Right Eye Distance:   Left Eye Distance:   Bilateral Distance:    Right Eye Near:   Left Eye Near:    Bilateral Near:     Physical Exam  Constitutional: He is oriented to person, place, and time. He appears well-developed and well-nourished. No distress.  HENT:  Head: Normocephalic and atraumatic.  Eyes: EOM are normal. Pupils are equal, round, and reactive to light.  Neck: Normal range of motion. Neck supple.  Cardiovascular: Normal rate, regular rhythm and intact distal pulses.  Murmur heard. Pulmonary/Chest: Effort normal and breath sounds normal. No respiratory distress.  Abdominal: Soft.  Musculoskeletal: Normal range of motion. He exhibits no edema.  Neurological: He is alert and oriented to person, place, and time.  Skin: Skin is warm and dry.     UC Treatments / Results  Labs (all labs ordered are listed, but only abnormal results are displayed) Labs Reviewed - No data to display  EKG  EKG Interpretation None       Radiology No results  found.  Procedures Procedures (including critical care time)  Medications Ordered in UC Medications - No data to display   Initial Impression / Assessment and Plan / UC Course  I have reviewed the triage vital signs and the nursing notes.  Pertinent labs & imaging results that were available during my care of the patient were reviewed by me and considered in my medical decision making (see chart for details).  Patient's blood pressure is elevated today, but not in severe range. I offered to start patient on HCTZ or chlorthalidone today and follow up with PCP. He would like to wait and discuss with his liver doctor and PCP. I am ok with this since blood pressure is only mildly elevated. I advised him to see eye doctor for floaters. He agrees to this plan. Will follow up with PCP tomorrow.     Final Clinical Impressions(s) / UC Diagnoses   Final diagnoses:  Essential hypertension    ED Discharge Orders    None       Controlled Substance Prescriptions [place] Controlled Substance Registry consulted? Not Applicable   Dannielle Huh, DO 02/25/17 1839

## 2017-02-26 ENCOUNTER — Telehealth: Payer: Self-pay

## 2017-02-26 NOTE — Telephone Encounter (Signed)
Agree with UC/ER.  Please get update on patient- what has his BP been?  Thanks.

## 2017-02-26 NOTE — Telephone Encounter (Signed)
Left detailed message on voicemail.  

## 2017-02-26 NOTE — Telephone Encounter (Signed)
Patient did go to UC last night and was offered a new BP med or the option of following up with PCP.  Patient elected to f/u with Dr. Damita Dunnings but has not scheduled f/u as of yet.  BP's at home have stayed around 160/90's consistently and patient still has a dull headache near the back of his head.  Patient states he has had floaters in his vision before but never this colored square.

## 2017-02-26 NOTE — Telephone Encounter (Signed)
Left detailed message on voicemail to return call with BP readings as of late.

## 2017-02-26 NOTE — Telephone Encounter (Signed)
Okay to add on extra half tab of carvedilol in the AM and PM if needed.   F/u here next week.  Thanks.

## 2017-02-26 NOTE — Telephone Encounter (Signed)
Copied from [place]. Topic: Quick Communication - Office Called Patient >> Feb 26, 2017 12:17 PM Josetta Huddle, CMA wrote: Reason for CRM:   Left detailed message on voicemail for patient to return call with recent BP readings and update on his condition with blurred vision.  Please take message. >> Feb 26, 2017  1:42 PM Synthia Innocent wrote: Patient calling back, unable to add to nurse triage message, patient states BP has been running around 160/90, pulse is good, did go to urgent care yesterday. Call back # [phone]

## 2017-02-28 NOTE — Telephone Encounter (Signed)
See following note, patient was called about this.

## 2017-03-04 ENCOUNTER — Ambulatory Visit (INDEPENDENT_AMBULATORY_CARE_PROVIDER_SITE_OTHER): Payer: BLUE CROSS/BLUE SHIELD | Admitting: Family Medicine

## 2017-03-04 ENCOUNTER — Encounter: Payer: Self-pay | Admitting: Family Medicine

## 2017-03-04 DIAGNOSIS — I10 Essential (primary) hypertension: Secondary | ICD-10-CM

## 2017-03-04 MED ORDER — CARVEDILOL 3.125 MG PO TABS: 4.6875 mg | ORAL_TABLET | Freq: Two times a day (BID) | ORAL | Status: DC

## 2017-03-04 MED ORDER — CARVEDILOL 3.125 MG PO TABS: ORAL_TABLET | ORAL | 3 refills | Status: DC

## 2017-03-04 NOTE — Progress Notes (Signed)
He is taking carvedilol 1.5 tabs BID.  He was seen at [facility] with BP elevation.  D/w pt.  That was 1 week ago.    He has some intermittent "floaters", seems to be in the R eye, likely not in the B eyes.  He isn't having vision loss or field cuts.  No HA associated with the floater.  Floater present for about 1 minute at a time.    He thought he had migraines in the past, with aura prior to a HA.  The recent "floater" sx is not as bad or exactly the same as the aura from prior.  No clear migraines in the last few months.    His BP is higher in the AM, usually better as the day goes on, with a R sided occipital HA noted in the AM. That pain is separate from the above and gets better as the day goes on.  BP 140/90s or higher in the AM recently, pulse in the 60s.  Can be 140s or lower SBP at night.    He isn't snoring and is complaint with CPAP.  He noted some fatigue with the beta blocker start.  He has some occ leg cramping.    Meds, vitals, and allergies reviewed.   ROS: Per HPI unless specifically indicated in ROS section   GEN: nad, alert and oriented HEENT: mucous membranes moist, EOMI, normal smile B, PERRL, EOMI NECK: supple w/o LA CV: rrr PULM: ctab, no inc wob ABD: soft, +bs EXT: no edema

## 2017-03-04 NOTE — Patient Instructions (Signed)
Call about an eye clinic appointment.  Increase the carvedilol to 1.5 tabs in the AM and 2 tabs at night.  Update me in about 1 week.  I'll await your follow up labs at the other clinic.  Take care.  Glad to see you.

## 2017-03-05 NOTE — Assessment & Plan Note (Signed)
He could have migraines, and beta blocker would be useful.  The current issue with a floater could be a separate issue.  He'll call about an eye clinic appointment.  Increase the carvedilol to 1.5 tabs in the AM and 2 tabs at night.  Update me in about 1 week re: his BP.  I'll await his follow up labs at the liver clinic, has f/u pending.  He agrees.

## 2017-03-09 DIAGNOSIS — K7469 Other cirrhosis of liver: Secondary | ICD-10-CM | POA: Diagnosis not present

## 2017-03-19 ENCOUNTER — Other Ambulatory Visit: Payer: Self-pay | Admitting: *Deleted

## 2017-03-25 ENCOUNTER — Encounter: Payer: Self-pay | Admitting: Internal Medicine

## 2017-03-25 ENCOUNTER — Ambulatory Visit: Payer: 59 | Admitting: Internal Medicine

## 2017-03-25 VITALS — BP 140/98 | HR 59 | Ht 68.0 in | Wt 180.0 lb

## 2017-03-25 DIAGNOSIS — I350 Nonrheumatic aortic (valve) stenosis: Secondary | ICD-10-CM

## 2017-03-25 DIAGNOSIS — K7460 Unspecified cirrhosis of liver: Secondary | ICD-10-CM | POA: Diagnosis not present

## 2017-03-25 DIAGNOSIS — I10 Essential (primary) hypertension: Secondary | ICD-10-CM

## 2017-03-25 MED ORDER — AMLODIPINE BESYLATE 5 MG PO TABS: 2.5000 mg | ORAL_TABLET | Freq: Every day | ORAL | 3 refills | Status: DC

## 2017-03-25 MED ORDER — CARVEDILOL 3.125 MG PO TABS: 3.1250 mg | ORAL_TABLET | Freq: Two times a day (BID) | ORAL | 3 refills | Status: DC

## 2017-03-25 NOTE — Patient Instructions (Signed)
Your physician has recommended you make the following change in your medication:  1.) start amlodipine 5mg  --take 1/2 tablet once a day 2.) decrease carvedilol to 1 tablet twice a day  Use MyChart to send a message to [name] with a list of your blood pressure readings in a few weeks.

## 2017-03-25 NOTE — Progress Notes (Signed)
Cardiology Office Note   Date:  03/25/2017   ID:  ZEV BLUE, DOB 05/05/1960, MRN [id number]  PCP:  [name], MD  Cardiologist:   Dorris Carnes, MD   F/U of aortic stenosis.     History of Present Illness: [name] is a 57 y.o. male with a history of aortic stenosis.  Also a history of liver problems  Being followed by GI   Hx of cirrhosis with portal Hx  (EtOH in past) B Blocker used for BP I saw him last year  Echo in September showed stable moderate AS    On Jan 3 pt went to ED with BP 150s to 160s/ 90s  Had some floaters  No changes made   F/U with Elsie Stain BP 130/80  Recomm that carvedilol be increased to 1.5 tabs in am 2 tabs in PM Being set up with ophthy The patient still has floaters  Has always felt a little "foggy" but no true dizziness  Denies CP   Has felt sluggish since carvedilol increased  Current Meds  Medication Sig  . azelastine (ASTELIN) 0.1 % nasal spray PLACE 2 SPRAYS INTO BOTH NOSTRILS TWICE DAILY  . carvedilol (COREG) 3.125 MG tablet 1.5 tabs in the AM and 2 tabs at night.  . cholestyramine light (PREVALITE) 4 GM/DOSE powder DISSOLVE 1 SCOOP IN LIQUID AND TAKE BY MOUTH TWICE DAILY  . fluticasone (FLONASE) 50 MCG/ACT nasal spray PLACE ONE OR TWO SPRAYS INTO BOTH NOSTRILS DAILY AS NEEDED.  . fluticasone (FLOVENT HFA) 110 MCG/ACT inhaler Inhale 2 puffs into the lungs 2 (two) times daily. Rinse after use.  . ibuprofen (ADVIL,MOTRIN) 200 MG tablet Take 200 mg by mouth daily as needed for headache.  . lactulose (CHRONULAC) 10 GM/15ML solution Take 10 g by mouth 2 (two) times daily.   [name] levothyroxine (SYNTHROID, LEVOTHROID) 137 MCG tablet Take 1 tablet (137 mcg total) by mouth daily before breakfast.  . milk thistle 175 MG tablet Take 175 mg by mouth daily.  . Multiple Vitamin (MULTIVITAMIN) tablet Take 1 tablet by mouth daily.  . Omega-3 Fatty Acids (FISH OIL PO) Take 1 capsule by mouth 3 (three) times a week.  [name] omeprazole (PRILOSEC) 20 MG  capsule Take 20 mg by mouth daily.  . predniSONE (DELTASONE) 10 MG tablet Take 20 mg by mouth daily with breakfast.  . ursodiol (ACTIGALL) 500 MG tablet Take 500 mg by mouth 2 (two) times daily.  . VENTOLIN HFA 108 (90 Base) MCG/ACT inhaler INHALE 2 PUFFS INTO THE LUNGS EVERY 6 HOURS AS NEEDED.  [name] zinc gluconate 50 MG tablet Take 50 mg by mouth daily.     Allergies:   Watermelon flavor   Past Medical History:  Diagnosis Date  . Allergy   . Asthma   . Bicuspid aortic valve    sees dr Harrington Challenger  . Fatty liver    with h/o elevated LFT's  . GERD (gastroesophageal reflux disease)   . Heart murmur   . Hypertension   . Itching    all over last few months  . Jaundice   . Migraine with aura   . OSA (obstructive sleep apnea) 09/14/2011   PSG 11/08/11>>AHI 31.6, SpO2 low 85%. wears CPAP, pt does not know settings  . Other abnormal glucose    diet controlled diabetic  . Thyroid disease     Past Surgical History:  Procedure Laterality Date  . APPENDECTOMY  10/09   Emergency  . BIOPSY THYROID  08/19/07   Attempted, no tissue obtained  . CARDIOVASCULAR STRESS TEST  03/07   Negative 06/05  . DOPPLER ECHOCARDIOGRAPHY  06/04  . ESOPHAGEAL BANDING N/A 05/04/2016   Procedure: ESOPHAGEAL BANDING;  Surgeon: [name], MD;  [facility];  Service: Endoscopy;  Laterality: N/A;  . ESOPHAGOGASTRODUODENOSCOPY (EGD) WITH PROPOFOL N/A 05/04/2016   Procedure: ESOPHAGOGASTRODUODENOSCOPY (EGD) WITH PROPOFOL;  Surgeon: [name], MD;  Location: [facility];  Service: Endoscopy;  Laterality: N/A;     Social History:  The patient  reports that  has never smoked. he has never used smokeless tobacco. He reports that he does not drink alcohol or use drugs.   Family History:  The patient's family history includes Asthma in his mother; COPD in his father; Cancer in his father; Colon cancer in his maternal grandmother; Heart disease in his maternal grandfather and sister; Prostate cancer in his  maternal uncle.    ROS:  Please see the history of present illness. All other systems are reviewed and  Negative to the above problem except as noted.    PHYSICAL EXAM: VS:  BP (!) 140/98   Pulse (!) 59   Ht 5\' 8"  (1.727 m)   Wt 180 lb (81.6 kg)   SpO2 97%   BMI 27.37 kg/m   GEN: Well nourished, well developed, in no acute distress  HEENT: normal  Neck: JVP normal  No carotid bruits, or masses Cardiac: RRR  Gr III/VI systolic murmur base  , rubs, or gallops,no edema  Respiratory:  clear to auscultation bilaterally, normal work of breathing GI: soft, nontender, nondistended, + BS  No hepatomegaly  MS: no deformity Moving all extremities   Skin: warm and dry, no rash Neuro:  Strength and sensation are intact Psych: euthymic mood, full affect   EKG:  EKG is not ordered today.    Lipid Panel    Component Value Date/Time   CHOL 195 01/11/2017 0821   TRIG 194.0 (H) 01/11/2017 0821   HDL 25.00 (L) 01/11/2017 0821   CHOLHDL 8 01/11/2017 0821   VLDL 38.8 01/11/2017 0821   LDLCALC 131 (H) 01/11/2017 0821   LDLDIRECT 196.0 08/02/2012 0759      Wt Readings from Last 3 Encounters:  03/25/17 180 lb (81.6 kg)  03/04/17 184 lb 12 oz (83.8 kg)  02/25/17 183 lb (83 kg)      ASSESSMENT AND PLAN:  1  HTN  I would recomm adding amlodipine 2.5 mg then 5 mg to help bp  Keep on carvedilol but cut back to 1 tab bid     2  Aortic stenosis  Continue to follow with yearly echos for now.    3  Liver dz  Followed in GI I have asked him to send in readings in a few wks   F/U based on response   Current medicines are reviewed at length with the patient today.  The patient does not have concerns regarding medicines.  Signed, Dorris Carnes, MD  03/25/2017 4:37 PM    [address], [place], [place]  [address] Phone: [phone]; Fax: [phone]

## 2017-04-26 DIAGNOSIS — K7469 Other cirrhosis of liver: Secondary | ICD-10-CM | POA: Diagnosis not present

## 2017-04-26 DIAGNOSIS — K7290 Hepatic failure, unspecified without coma: Secondary | ICD-10-CM | POA: Diagnosis not present

## 2017-04-26 DIAGNOSIS — K8309 Other cholangitis: Secondary | ICD-10-CM | POA: Diagnosis not present

## 2017-04-28 ENCOUNTER — Other Ambulatory Visit: Payer: Self-pay | Admitting: Nurse Practitioner

## 2017-04-28 DIAGNOSIS — K7460 Unspecified cirrhosis of liver: Secondary | ICD-10-CM

## 2017-05-02 ENCOUNTER — Telehealth: Payer: Self-pay | Admitting: Family Medicine

## 2017-05-02 NOTE — Telephone Encounter (Signed)
Call pt.  Notes from the liver clinic reviewed, they wanted me to follow up on the patient's thyroid and dry skin.  OV when possible, we can do labs at the visit and he doesn't have to fast.  Thanks.

## 2017-05-03 NOTE — Telephone Encounter (Signed)
Patient contacted and says he is supposed to be getting lab work tomorrow through the liver clinic and he will see if they will add on the lab for the thyroid and get those results before making the appointment here.

## 2017-05-03 NOTE — Telephone Encounter (Signed)
Noted. Thanks.  I'll await the lab report.

## 2017-05-04 DIAGNOSIS — K7469 Other cirrhosis of liver: Secondary | ICD-10-CM | POA: Diagnosis not present

## 2017-05-04 DIAGNOSIS — E871 Hypo-osmolality and hyponatremia: Secondary | ICD-10-CM | POA: Diagnosis not present

## 2017-05-07 ENCOUNTER — Ambulatory Visit: Payer: 59 | Admitting: Family Medicine

## 2017-05-07 ENCOUNTER — Telehealth: Payer: Self-pay

## 2017-05-07 ENCOUNTER — Encounter: Payer: Self-pay | Admitting: Family Medicine

## 2017-05-07 ENCOUNTER — Ambulatory Visit (INDEPENDENT_AMBULATORY_CARE_PROVIDER_SITE_OTHER): Payer: 59 | Admitting: Family Medicine

## 2017-05-07 VITALS — BP 124/70 | HR 63 | Temp 98.4°F | Wt 173.8 lb

## 2017-05-07 DIAGNOSIS — E119 Type 2 diabetes mellitus without complications: Secondary | ICD-10-CM

## 2017-05-07 LAB — CBC WITH DIFFERENTIAL/PLATELET
Basophils Absolute: 0.1 10*3/uL (ref 0.0–0.1)
Basophils Relative: 1.6 % (ref 0.0–3.0)
Eosinophils Absolute: 0.2 10*3/uL (ref 0.0–0.7)
Eosinophils Relative: 2.1 % (ref 0.0–5.0)
HCT: 41.9 % (ref 39.0–52.0)
Hemoglobin: 14.3 g/dL (ref 13.0–17.0)
Lymphocytes Relative: 26.1 % (ref 12.0–46.0)
Lymphs Abs: 2 10*3/uL (ref 0.7–4.0)
MCHC: 34.2 g/dL (ref 30.0–36.0)
MCV: 91.9 fl (ref 78.0–100.0)
Monocytes Absolute: 0.6 10*3/uL (ref 0.1–1.0)
Monocytes Relative: 8.6 % (ref 3.0–12.0)
Neutro Abs: 4.6 10*3/uL (ref 1.4–7.7)
Neutrophils Relative %: 61.6 % (ref 43.0–77.0)
Platelets: 164 10*3/uL (ref 150.0–400.0)
RBC: 4.56 Mil/uL (ref 4.22–5.81)
RDW: 13.8 % (ref 11.5–15.5)
WBC: 7.5 10*3/uL (ref 4.0–10.5)

## 2017-05-07 LAB — COMPREHENSIVE METABOLIC PANEL
ALT: 76 U/L — ABNORMAL HIGH (ref 0–53)
AST: 74 U/L — ABNORMAL HIGH (ref 0–37)
Albumin: 3.9 g/dL (ref 3.5–5.2)
Alkaline Phosphatase: 182 U/L — ABNORMAL HIGH (ref 39–117)
BUN: 16 mg/dL (ref 6–23)
CO2: 26 mEq/L (ref 19–32)
Calcium: 9.1 mg/dL (ref 8.4–10.5)
Chloride: 101 mEq/L (ref 96–112)
Creatinine, Ser: 0.77 mg/dL (ref 0.40–1.50)
GFR: 111.51 mL/min (ref >60.00)
Glucose, Bld: 312 mg/dL — ABNORMAL HIGH (ref 70–99)
Potassium: 3.6 mEq/L (ref 3.5–5.1)
Sodium: 134 mEq/L — ABNORMAL LOW (ref 135–145)
Total Bilirubin: 2.4 mg/dL — ABNORMAL HIGH (ref 0.2–1.2)
Total Protein: 6.9 g/dL (ref 6.0–8.3)

## 2017-05-07 LAB — POCT CBG (FASTING - GLUCOSE)-MANUAL ENTRY: Glucose Fasting, POC: 292 mg/dL — AB (ref 70–99)

## 2017-05-07 LAB — TSH: TSH: 2.27 u[IU]/mL (ref 0.35–4.50)

## 2017-05-07 LAB — HEMOGLOBIN A1C: Hgb A1c MFr Bld: 14 % — ABNORMAL HIGH (ref 4.6–6.5)

## 2017-05-07 MED ORDER — PEN NEEDLES 31G X 5 MM MISC: 1.0000 | Freq: Every day | 3 refills | Status: DC

## 2017-05-07 MED ORDER — AMLODIPINE BESYLATE 5 MG PO TABS: 5.0000 mg | ORAL_TABLET | Freq: Every day | ORAL | Status: DC

## 2017-05-07 MED ORDER — INSULIN GLARGINE 100 UNIT/ML SOLOSTAR PEN: 5.0000 [IU] | PEN_INJECTOR | Freq: Every day | SUBCUTANEOUS | 99 refills | Status: DC

## 2017-05-07 MED ORDER — BASAGLAR KWIKPEN 100 UNIT/ML ~~LOC~~ SOPN: PEN_INJECTOR | SUBCUTANEOUS | 99 refills | Status: DC

## 2017-05-07 MED ORDER — PREDNISONE 10 MG PO TABS: 10.0000 mg | ORAL_TABLET | Freq: Every day | ORAL | Status: DC

## 2017-05-07 NOTE — Progress Notes (Signed)
I was able to get his labs and check them this AM.  There was no advance notice about his sugar elevation.  Per patient, he was was called about his sugar 2 days ago, told to come see me.  It turns out he got a call that he didn't know about prev- he checked his phone after the fact and the liver clinic had called and advised ER.  Polyuria, polydypsia, going on for about 1 month.  Prev sugar was <100 here, a few months ago.   Sugar was 292 here this AM.  Taking 15mg  prednisone a day now.  He has meter to check his sugar but hasn't been checking it recently.    He felt better on prednisone initially.  He is on 5mg  amlodipine but off carvedilol.    No FCNAVD.    PMH and SH reviewed  ROS: Per HPI unless specifically indicated in ROS section   Meds, vitals, and allergies reviewed.   GEN: nad, alert and oriented HEENT: mucous membranes moist NECK: supple w/o LA CV: rrr. PULM: ctab, no inc wob ABD: soft, +bs EXT: no edema SKIN: no jaundice.   ====================================== 2nd portion of visit.  He was able to go and pick up insulin pens and then return for insulin teaching.  We decided to bundle this all as one office visit today.  We talked about his labs.  A1c 14.  We talked about insulin instruction.  He was able to give himself 5 units using aseptic technique without any complication.  He understood the rationale for use and the plan going forward.  rx done for meter, strips, lancets.

## 2017-05-07 NOTE — Telephone Encounter (Signed)
Thanks

## 2017-05-07 NOTE — Patient Instructions (Addendum)
Cut the prednisone back to 10mg  a day.   Go to the lab on the way out.  We'll contact you with your lab report. Start checking your sugar and update me Monday AM.  If you get more lightheaded or feel worse, then go to the ER.   Cut out all carbs- "all the whites", sodas, sweets, crackers.   Drink enough water to keep your urine clear.   Take care.  Glad to see you.   Tell the front you want to come back at [facility] for another visit.  If you can't get the pens and needles, then let me know.  Bring the needs and pens to the visit later today.    ------------------------------------------------------------------------------ Use the eat right diet.  You gave yourself 5 units today.  You'll likely take 6 units Saturday, 7 on Sunday, then 8 on Monday, etc.  If your morning sugar is above 150, then add 1 unit to the next dose.  If your morning sugar is below 100, then take away 1 unit from the next dose.  If your morning sugar is 100-150, then no change in the next dose.   Update me about how you feel and your readings/sugar on Monday or Tuesday.  Take care.  Glad to see you.

## 2017-05-07 NOTE — Telephone Encounter (Signed)
Alyse Low said pt went to get lantus and was advised not covered by ins. [facility] faxed alternative replacements for lantus; Dr Damita Dunnings said Basaglar; same sig, needles etc. Alyse Low voiced understanding and will let pt know. Will send to [facility] and I spoke with [place] pharmacist at [facility] so pt can bring to 4 pm appt today with Dr Damita Dunnings. FYI to Dr Damita Dunnings.

## 2017-05-08 ENCOUNTER — Ambulatory Visit
Admission: RE | Admit: 2017-05-08 | Discharge: 2017-05-08 | Disposition: A | Discharge status: Home / Self Care | Payer: 59 | Source: Ambulatory Visit | Attending: Nurse Practitioner | Admitting: Nurse Practitioner

## 2017-05-08 DIAGNOSIS — K7460 Unspecified cirrhosis of liver: Secondary | ICD-10-CM

## 2017-05-08 MED ORDER — IOPAMIDOL (ISOVUE-300) INJECTION 61%
125.0000 mL | Freq: Once | INTRAVENOUS | Status: AC | PRN
  Administered 2017-05-08: 125 mL via INTRAVENOUS

## 2017-05-09 DIAGNOSIS — E119 Type 2 diabetes mellitus without complications: Secondary | ICD-10-CM | POA: Insufficient documentation

## 2017-05-09 NOTE — Progress Notes (Signed)
See other note

## 2017-05-09 NOTE — Assessment & Plan Note (Signed)
See other note

## 2017-05-09 NOTE — Assessment & Plan Note (Signed)
I did not get a phone call with advance warning about his recent sugar elevation.  Fortunately his sugar was only 292 on the morning of the visit.  We talked about options.  He left the office and went and got his insulin prescription filled.  He came back with pens and needles.  He gave himself 5 units with aseptic technique without complication.  He understood the plan going forward.  We talked about his labs.  We talked about low sugar diet.  I think a lot of this is related to his prednisone use.  He will cut his prednisone back to 10 mg a day. He will continue to add 1 unit of insulin each day until his fasting a.m. sugar is between 100 and 150.  At that point he will continue the previous day's dose.  If his sugar is below 100 in the morning when fasting then he will cut back 1 unit/day.  He will update me about his sugar in a few days.  At this point still okay for outpatient follow-up. >45 minutes spent in face to face time with patient, >50% spent in counselling or coordination of care.

## 2017-05-11 ENCOUNTER — Encounter: Payer: Self-pay | Admitting: Family Medicine

## 2017-05-11 LAB — LAB REPORT - SCANNED
Creatinine, Ser: 0.94
Glucose: 721
Potassium: 5.4
Sodium: 129

## 2017-05-18 ENCOUNTER — Encounter: Payer: Self-pay | Admitting: Family Medicine

## 2017-05-18 ENCOUNTER — Ambulatory Visit: Payer: 59 | Admitting: Family Medicine

## 2017-05-18 DIAGNOSIS — E119 Type 2 diabetes mellitus without complications: Secondary | ICD-10-CM

## 2017-05-18 NOTE — Patient Instructions (Addendum)
Please call the GI clinic and tell them you stopped prednisone.   Keep the eye clinic follow up.  I think your sugar previously warped your lens and is in the midst of adjusting.   Your glasses rx may continue to change in the near future.  I would continue as planned with the insulin.  Take care.  Glad to see you.

## 2017-05-18 NOTE — Progress Notes (Signed)
Facial rash noted with sun exposure and CPAP use.  Not itchy.    DM2.  He is up to 12 units of insulin.  He has been compliant with use w/o troubles.  Sugar last night was <150.  He sugar has been gradually coming down.  Sugar was prev >600.    He likely had some vision changes prior to the inulin start but he has more changes in the meantime.  No vision loss but his OTC lens has gone from 2.5 to 3.25 diopter now for reading.    He quit prednisone in the meantime, about a few days after last OV.  D/w pt.  See AVS. I specifically asked him to call the GI clinic about this.  Meds, vitals, and allergies reviewed.   ROS: Per HPI unless specifically indicated in ROS section   GEN: nad, alert and oriented HEENT: mucous membranes moist NECK: supple w/o LA CV: rrr.  PULM: ctab, no inc wob ABD: soft, +bs EXT: no edema SKIN: Bilateral maculopapular facial rash noted in CPAP mask distribution  20/70 B eyes.

## 2017-05-19 NOTE — Assessment & Plan Note (Signed)
He feels better now that he is taking insulin.  His sugar is better.  He likely had some vision changes related to aging that was concurrently affected by diabetes, with hyperglycemia affecting the curvature of his lens.  With his sugar improved, these changes are now more obvious.  He does not have focal vision loss.  I want him to follow with the eye clinic and continue as is treating his diabetes with insulin.  He stopped prednisone in the meantime and I want him to call the GI clinic about that.  I will defer about prednisone use.  He agrees.  See after visit summary.

## 2017-05-24 LAB — HM DIABETES EYE EXAM

## 2017-05-25 DIAGNOSIS — H524 Presbyopia: Secondary | ICD-10-CM | POA: Diagnosis not present

## 2017-06-05 ENCOUNTER — Other Ambulatory Visit: Payer: Self-pay | Admitting: Family Medicine

## 2017-06-05 DIAGNOSIS — E039 Hypothyroidism, unspecified: Secondary | ICD-10-CM

## 2017-06-26 ENCOUNTER — Other Ambulatory Visit: Payer: Self-pay | Admitting: Family Medicine

## 2017-07-26 DIAGNOSIS — K8309 Other cholangitis: Secondary | ICD-10-CM | POA: Diagnosis not present

## 2017-07-26 DIAGNOSIS — K7469 Other cirrhosis of liver: Secondary | ICD-10-CM | POA: Diagnosis not present

## 2017-08-11 DIAGNOSIS — G4733 Obstructive sleep apnea (adult) (pediatric): Secondary | ICD-10-CM | POA: Diagnosis not present

## 2017-08-15 IMAGING — US US BIOPSY
1 series · 10 of 10 positions shown · non-contrast
Comparison: none

CLINICAL DATA: Jaundice. MR suggests cirrhosis without focal
lesion.

EXAM:
ULTRASOUND-GUIDED CORE LIVER BIOPSY
TECHNIQUE: An ultrasound guided liver biopsy was thoroughly discussed with the
patient and questions were answered. The benefits, risks,
alternatives, and complications were also discussed. The patient
understands and wishes to proceed with the procedure. A verbal as
well as written consent was obtained.

[Series 1: us biopsy · 0.26mm/px · 10 of 10 slices shown]
[im 1/10]
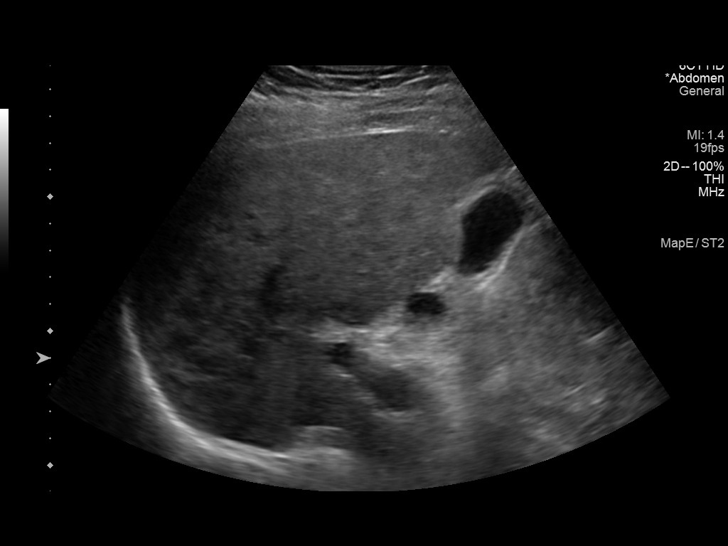
[im 2/10]
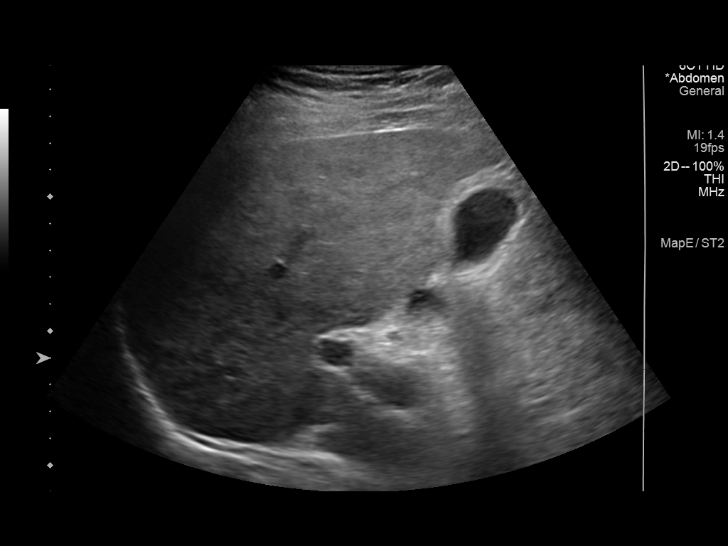
[im 3/10]
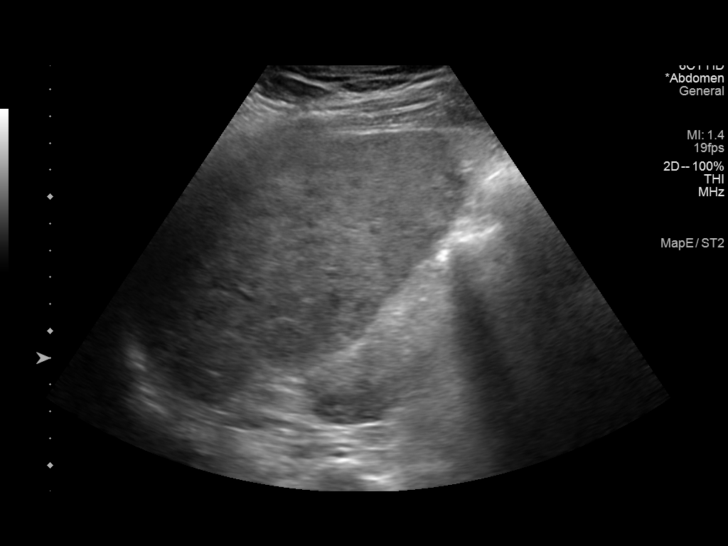
[im 4/10]
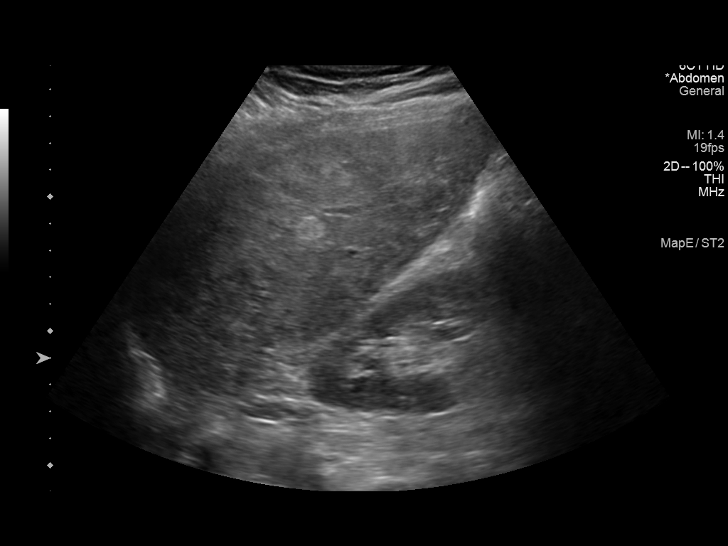
[im 5/10]
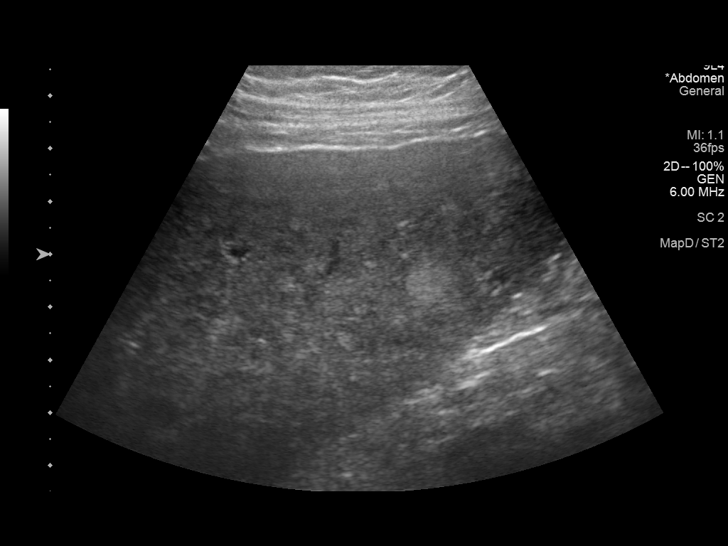
[im 6/10]
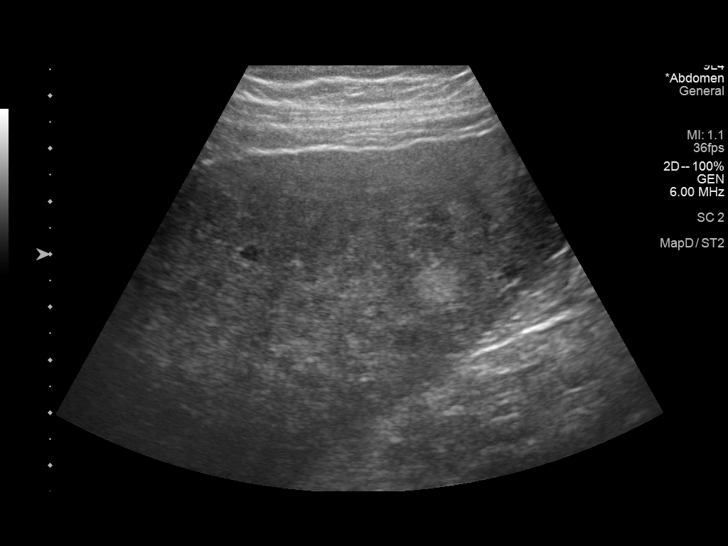
[im 7/10]
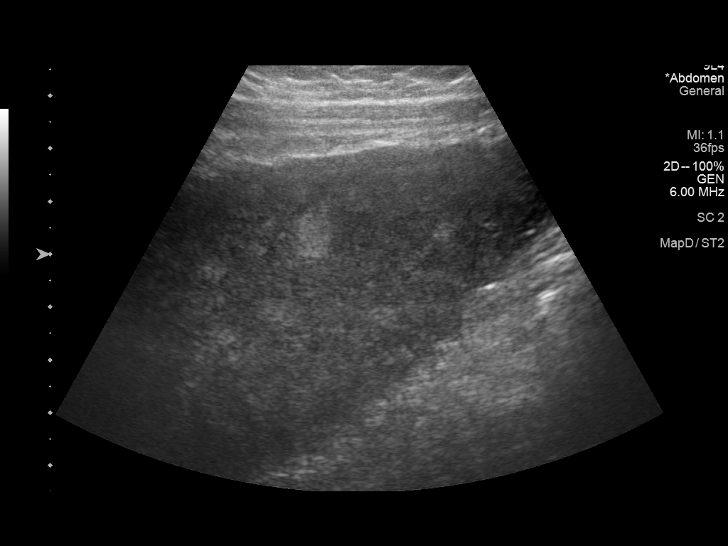
[im 8/10]
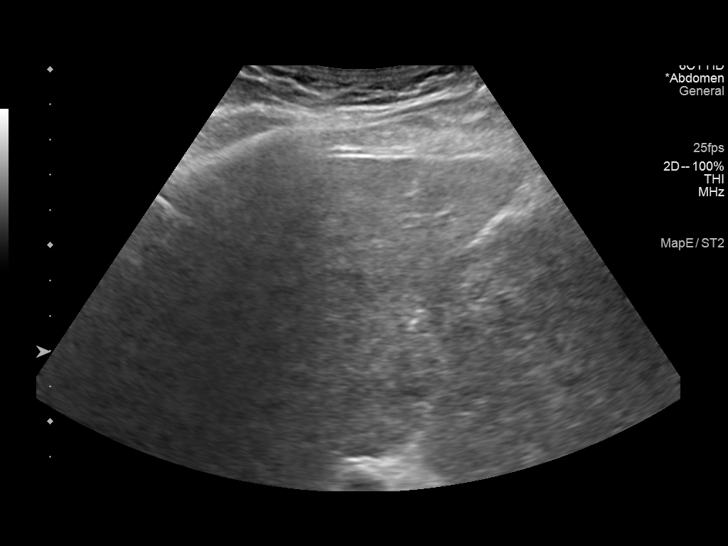
[im 9/10]
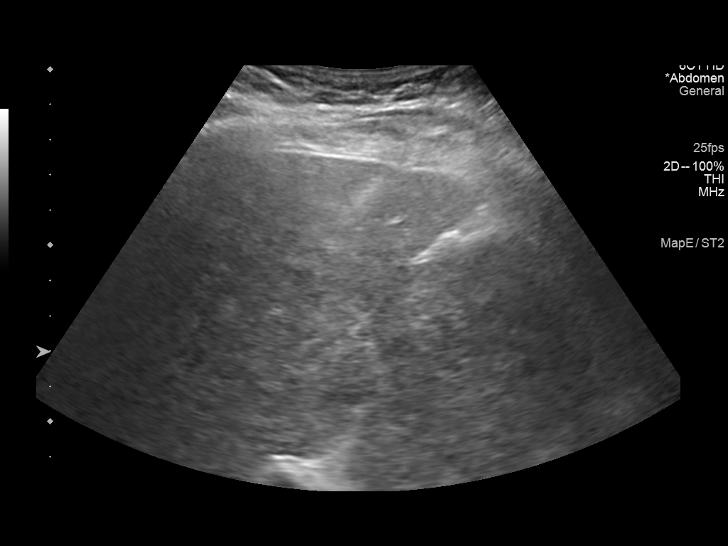
[im 10/10]
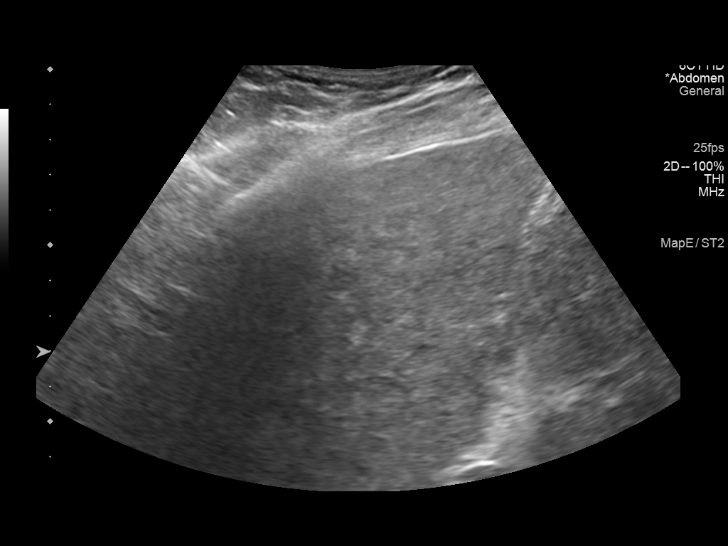

[10 of 10 positions shown; findings below may reference images not displayed]

Survey ultrasound of the liver was performed and an appropriate skin
entry site was determined. Skin site was marked, prepped with
Betadine, and draped in usual sterile fashion, and infiltrated
locally with 1% lidocaine.

Intravenous Fentanyl and Versed were administered as conscious
sedation during continuous monitoring of the patient's level of
consciousness and physiological / cardiorespiratory status by the
radiology RN, with a total moderate sedation time of 10 minutes. A
17 gauge trocar needle was advanced under ultrasound guidance into
the liver. 3 coaxial 99gauge core samples were then obtained through
the guide needle. The guide needle was removed. Post procedure scans
demonstrate no apparent complication.

COMPLICATIONS:
COMPLICATIONS
None immediate
FINDINGS: Survey ultrasound of the liver shows multiple small hyperechoic
presumed regenerative nodules. Representative core biopsy samples
obtained.
IMPRESSION: 1. Technically successful ultrasound guided core liver biopsy.

## 2017-10-22 ENCOUNTER — Ambulatory Visit: Payer: 59 | Admitting: Family Medicine

## 2017-10-22 ENCOUNTER — Encounter: Payer: Self-pay | Admitting: Family Medicine

## 2017-10-22 VITALS — BP 124/80 | HR 62 | Temp 97.8°F | Ht 68.0 in | Wt 179.5 lb

## 2017-10-22 DIAGNOSIS — M2550 Pain in unspecified joint: Secondary | ICD-10-CM | POA: Diagnosis not present

## 2017-10-22 DIAGNOSIS — E119 Type 2 diabetes mellitus without complications: Secondary | ICD-10-CM | POA: Diagnosis not present

## 2017-10-22 DIAGNOSIS — R945 Abnormal results of liver function studies: Secondary | ICD-10-CM

## 2017-10-22 LAB — URIC ACID: Uric Acid, Serum: 4.7 mg/dL (ref 4.0–7.8)

## 2017-10-22 LAB — POCT GLYCOSYLATED HEMOGLOBIN (HGB A1C): Hemoglobin A1C: 5.2 % (ref 4.0–5.6)

## 2017-10-22 NOTE — Progress Notes (Signed)
Diabetes:  Using medications without difficulties: yes, 8-10 units per day.   Hypoglycemic episodes:  No episodes documented.  D/w pt about checking sugar episodically, if he had any sx.   Hyperglycemic episodes: not now, prev with sig elevations earlier in 2019.   Feet problems: no tingling but some L 1st MTP pain, going on for months.  Often, nearly constant.  No redness.  Can be sore to the point where having the sheet lay across the foot causes pain.   Blood Sugars averaging: ~100 in the AM.   eye exam within last year: done spring 2019 per patient.  No retinopathy per patient report.  Done ~05/24/17.   A1c d/w pt.  Sig better.   Off prednisone.    Liver disease per outside clinic.  He is using cholestyramine as needed for itching.  He has f/u with the liver clinic pending.  I'll the notes from liver clinic.  Some fatigue.  No jaundice.  No abd pain.  Some occ abd bloating.    PMH and SH reviewed Meds, vitals, and allergies reviewed.   ROS: Per HPI unless specifically indicated in ROS section   GEN: nad, alert and oriented HEENT: mucous membranes moist NECK: supple w/o LA CV: rrr. PULM: ctab, no inc wob ABD: soft, +bs EXT: no edema SKIN: no acute rash  Diabetic foot exam: Normal inspection No skin breakdown No calluses  Normal DP pulses Normal sensation to light touch and monofilament Nails normal Left first MTP is slightly tender but not red.

## 2017-10-22 NOTE — Patient Instructions (Addendum)
I'll await the notes from the liver clinic.  Ask them about gout treatment options, especially if they are okay with you taking colchicine if needed.  Go to the lab on the way out.  We'll contact you with your lab report. Take care.  Glad to see you.  Plan on recheck in 3 months.  The only lab you need to have done for your next diabetic visit is an A1c.  We can do this with a fingerstick test at the office visit.  You do not need a lab visit ahead of time for this.  It does not matter if you are fasting when the lab is done.

## 2017-10-25 DIAGNOSIS — M2550 Pain in unspecified joint: Secondary | ICD-10-CM | POA: Insufficient documentation

## 2017-10-25 NOTE — Assessment & Plan Note (Signed)
A1c significantly better.  Discussed with patient.  Off prednisone.  Continue as is for now.  We talked about checking sugar episodically to try to limit risk of hypoglycemia.  Recheck in about 3 months.

## 2017-10-25 NOTE — Assessment & Plan Note (Signed)
Liver disease management per hepatology clinic.  See after visit summary.  I will defer.

## 2017-10-25 NOTE — Assessment & Plan Note (Signed)
Unclear to me if the patient could have gout.  Check uric acid level.  Unclear if previously would have had symptoms that were masked by being on prednisone.  I want him to check in with the liver clinic to see what they think about the treatment options.  See after visit summary.  See notes on labs.

## 2017-10-27 DIAGNOSIS — K7469 Other cirrhosis of liver: Secondary | ICD-10-CM | POA: Diagnosis not present

## 2017-10-27 DIAGNOSIS — K8309 Other cholangitis: Secondary | ICD-10-CM | POA: Diagnosis not present

## 2017-10-27 DIAGNOSIS — K7290 Hepatic failure, unspecified without coma: Secondary | ICD-10-CM | POA: Diagnosis not present

## 2017-10-29 ENCOUNTER — Other Ambulatory Visit: Payer: Self-pay | Admitting: Nurse Practitioner

## 2017-10-29 DIAGNOSIS — K769 Liver disease, unspecified: Secondary | ICD-10-CM

## 2017-10-29 DIAGNOSIS — K7469 Other cirrhosis of liver: Secondary | ICD-10-CM

## 2017-11-04 ENCOUNTER — Ambulatory Visit
Admission: RE | Admit: 2017-11-04 | Discharge: 2017-11-04 | Disposition: A | Discharge status: Home / Self Care | Payer: 59 | Source: Ambulatory Visit | Attending: Nurse Practitioner | Admitting: Nurse Practitioner

## 2017-11-04 DIAGNOSIS — K7469 Other cirrhosis of liver: Secondary | ICD-10-CM

## 2017-11-04 DIAGNOSIS — K7689 Other specified diseases of liver: Secondary | ICD-10-CM | POA: Diagnosis not present

## 2017-11-04 DIAGNOSIS — K769 Liver disease, unspecified: Secondary | ICD-10-CM

## 2017-11-04 DIAGNOSIS — K7460 Unspecified cirrhosis of liver: Secondary | ICD-10-CM | POA: Diagnosis not present

## 2017-11-04 MED ORDER — IOPAMIDOL (ISOVUE-300) INJECTION 61%
100.0000 mL | Freq: Once | INTRAVENOUS | Status: AC | PRN
  Administered 2017-11-04: 100 mL via INTRAVENOUS

## 2017-11-10 ENCOUNTER — Ambulatory Visit: Payer: 59 | Admitting: Nurse Practitioner

## 2017-11-10 ENCOUNTER — Encounter: Payer: Self-pay | Admitting: Nurse Practitioner

## 2017-11-10 VITALS — BP 114/76 | HR 64 | Ht 68.0 in | Wt 178.8 lb

## 2017-11-10 DIAGNOSIS — K7460 Unspecified cirrhosis of liver: Secondary | ICD-10-CM

## 2017-11-10 DIAGNOSIS — K769 Liver disease, unspecified: Secondary | ICD-10-CM | POA: Diagnosis not present

## 2017-11-10 MED ORDER — ONDANSETRON HCL 4 MG PO TABS: 4.0000 mg | ORAL_TABLET | Freq: Two times a day (BID) | ORAL | 2 refills | Status: DC

## 2017-11-10 NOTE — Progress Notes (Signed)
Primary GI:  Oretha Caprice, MD  Chief Complaint:    Post-prandial nausea  IMPRESSION and PLAN:    1. 57 yo male with IgG4 autoimmune cholangiopathy and cirrhosis complicated by portal HTN / hepatic encephalopathy. Child Pugh B. Followed closed by [facility]. No improvement in liver tests with prednisone so discontinued. On Urso for pruritis.  -[facility] to send him for Liver Transplant evaluation.  -Repeat EGD for varices screening due March 2020 -continue Xifaxan and lactulose, no evidence for HE at present -[facility] surveillance, see #2  2. Liver lesion. Hyperenhancing liver lesion with interval enlargement to 1.6 cm and concerning for HCC. - I spoke briefly over phone with Roosevelt Locks, NP at [facility]. She had ordered the CTscan and her plan is for repeat imaging in December followed by referral to IR (? bx ) if lesion has enlarged to 2 cm. His AFP has been normal   3. Nausea, mainly postprandial. No vomiting. No associated weight loss. Etiology unclear, can't relate to medications.  -Trial of Zofran as needed.  -If no improvement then consider EGD, he is due for one anyway in March for varices screening.    4.  Colon cancer screening.  He is up-to-date, next colonoscopy due 2024                 HPI:     Patient is a 57 yo male with DM who I saw in Feb 2018 for evaluation of abnormal liver tests and ? cirrhosis with portal HTN on imaging. His ferritin was 750 , AMA mildly elevated at 26 and IgG elevated at 1859 though ANA was normal. Remaining serologic workup was negative.  Referred for liver biopsy which showed cirrhosis with marked cholestasis.  No evidence for PSC on MRCP nor biopsy. EGD for varices screening done March 2018 was basically unremarkable, plan was for repeat exam in 2 years.  Patient subsequently referred to [facility] for further evaluation where he has since been closely followed for an immune mediated cholestatic process.  He was tried on steroids, liver tests  failed to improved so weaned off. He has been maintained on Urso for itching. 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 There was increased iron on liver biopsy. Most recent ferritin level had improved to 127 though patient had donated blood several months prior.   Mr. Blessinger overall feels okay. He expresses understanding about recent CT scan findings and plan of treatment. His only GI complaint is that of nausea, especially postprandial.  He doesn't vomit. Tums help the nausea. He hasn't been able to correlate nausea to any medications. He recently started Xifaxan, started Urso 6 months ago. Not taking any NSAIDS. He has no GERD sx. No abdominal pain. Appetite is okay. Weight stable.     CT scan 11/04/17 Hepatobiliary: Left hepatic lobe hypertrophy. Nodular contour of the liver compatible with cirrhosis. Within the posterior right hepatic lobe (segment 6) there is a 1.6 cm hyperenhancing lesion (image 54; series 3), increased from prior were it measured 0.8 cm. Lesion is concerning for hepatocellular carcinoma. No additional hyperenhancing lesions are identified within the liver. Multiple similar-appearing small nodules are scattered throughout the liver on delayed sequences without arterial enhancement, suggestive of dysplastic nodules, not significantly changed. Reference nodule within the right hepatic lobe measures 1.4 cm (image 32; series 5), previously the same. Small amount of fluid within the porta hepatis. Cholelithiasis. Similar-appearing 1.4 cm porta hepatic node (image  50; series 4).     EGD March 2018 Erythematous mucosa in the prepyloric region of the stomach. - Normal esophagus. - The examination was otherwise normal. Photos were not captured. - No specimens collected.   Review of systems:     No chest pain, no SOB, no fevers, no urinary sx   Past Medical History:  Diagnosis Date  . Allergy   . Asthma   . Bicuspid aortic valve    sees dr Harrington Challenger  .  Cirrhosis ([place])   . Diabetes mellitus without complication ([place])   . Fatty liver    with h/o elevated LFT's  . GERD (gastroesophageal reflux disease)   . Heart murmur   . Hypertension   . Itching    all over last few months  . Jaundice   . Migraine with aura   . OSA (obstructive sleep apnea) 09/14/2011   PSG 11/08/11>>AHI 31.6, SpO2 low 85%. wears CPAP, pt does not know settings  . Other abnormal glucose    diet controlled diabetic  . Thyroid disease     Patient's surgical history, family medical history, social history, medications and allergies were all reviewed in Epic   Creatinine clearance cannot be calculated (Patient's most recent lab result is older than the maximum 21 days allowed.)  Current Outpatient Medications  Medication Sig Dispense Refill  . amLODipine (NORVASC) 5 MG tablet Take 1 tablet (5 mg total) by mouth daily.    [name] azelastine (ASTELIN) 0.1 % nasal spray PLACE 2 SPRAYS INTO BOTH NOSTRILS TWICE DAILY (Patient taking differently: As needed) 30 mL 5  . cholestyramine light (PREVALITE) 4 GM/DOSE powder DISSOLVE 1 SCOOP IN LIQUID AND TAKE BY MOUTH TWICE DAILY 240 g 5  . fluticasone (FLONASE) 50 MCG/ACT nasal spray PLACE ONE OR TWO SPRAYS INTO BOTH NOSTRILS DAILY AS NEEDED. 48 g 3  . fluticasone (FLOVENT HFA) 110 MCG/ACT inhaler Inhale 2 puffs into the lungs 2 (two) times daily. Rinse after use. 1 Inhaler 12  . ibuprofen (ADVIL,MOTRIN) 200 MG tablet Take 200 mg by mouth daily as needed for headache.    . Insulin Glargine (BASAGLAR KWIKPEN) 100 UNIT/ML SOPN INJECT 5 - 30 UNITS INTO THE SKIN DAILY AT 10 PM. 45 pen 1  . Insulin Pen Needle (PEN NEEDLES) 31G X 5 MM MISC 1 Device by Does not apply route daily. 100 each 3  . lactulose (CHRONULAC) 10 GM/15ML solution Take 10 g by mouth 2 (two) times daily.   5  . levothyroxine (SYNTHROID, LEVOTHROID) 137 MCG tablet TAKE 1 TABLET BY MOUTH ONCE DAILY BEFORE BREAKFAST 90 tablet 3  . milk thistle 175 MG tablet Take 175 mg by mouth  daily.    . Multiple Vitamin (MULTIVITAMIN) tablet Take 1 tablet by mouth daily.    . Omega-3 Fatty Acids (FISH OIL PO) Take 1 capsule by mouth 3 (three) times a week.    . ursodiol (ACTIGALL) 500 MG tablet Take 500 mg by mouth 2 (two) times daily.    . VENTOLIN HFA 108 (90 Base) MCG/ACT inhaler INHALE 2 PUFFS INTO THE LUNGS EVERY 6 HOURS AS NEEDED. 54 g 0  . zinc gluconate 50 MG tablet Take 50 mg by mouth daily.     No current facility-administered medications for this visit.     Physical Exam:     BP 114/76   Pulse 64   Ht 5\' 8"  (1.727 m)   Wt 178 lb 12.8 oz (81.1 kg)   BMI 27.19 kg/m   GENERAL:  Pleasant male in NAD PSYCH: : Cooperative, normal affect EENT:  conjunctiva pink, mucous membranes moist, neck supple without masses CARDIAC:  RRR, murmur heard, no peripheral edema PULM: Normal respiratory effort, lungs CTA bilaterally, no wheezing ABDOMEN:  Nondistended, soft, nontender. No obvious masses, no hepatomegaly,  normal bowel sounds SKIN:  turgor, no lesions seen Musculoskeletal:  Normal muscle tone, normal strength NEURO: Alert and oriented x 3, no focal neurologic deficits   Tye Savoy , NP 11/10/2017, 10:17 AM

## 2017-11-10 NOTE — Patient Instructions (Signed)
If you are age 55 or older, your body mass index should be between 23-30. Your Body mass index is 27.19 kg/m. If this is out of the aforementioned range listed, please consider follow up with your Primary Care Provider.  If you are age 41 or younger, your body mass index should be between 19-25. Your Body mass index is 27.19 kg/m. If this is out of the aformentioned range listed, please consider follow up with your Primary Care Provider.   We have sent the following medications to your pharmacy for you to pick up at your convenience: Zofran  Call in 7-10 days with an update.  Thank you for choosing me and [facility].   Tye Savoy, NP

## 2017-11-14 ENCOUNTER — Telehealth: Payer: Self-pay | Admitting: Family Medicine

## 2017-11-14 NOTE — Telephone Encounter (Signed)
Notify patient.  I reviewed the notes from the liver clinic.  I saw the notes about his liver lesion and that they are monitoring the spot that had gotten bigger.  I will await their follow-up notes.  The liver clinic was okay with him taking colchicine for gout prophylaxis.  They did not want him to take higher doses for treatment of a flare if he is already on a prophylactic dose.  How often is he having foot pain that is suggestive of gout?  Please let me know.  Thanks.

## 2017-11-15 ENCOUNTER — Other Ambulatory Visit: Payer: Self-pay | Admitting: Family Medicine

## 2017-11-15 MED ORDER — COLCHICINE 0.6 MG PO TABS: 0.6000 mg | ORAL_TABLET | Freq: Every day | ORAL | 0 refills | Status: DC | PRN

## 2017-11-15 NOTE — Addendum Note (Signed)
Addended by: [name] on: 11/15/2017 05:06 PM   Modules accepted: Orders

## 2017-11-15 NOTE — Telephone Encounter (Signed)
Patient notified as instructed by telephone and verbalized understanding. 

## 2017-11-15 NOTE — Telephone Encounter (Signed)
Would try colchicine daily for now and let me know if that isn't helping at all. rx sent.  Thanks.

## 2017-11-15 NOTE — Telephone Encounter (Signed)
Patient notified as instructed by telephone and verbalized understanding. Patient stated that he has pain daily in his foot and is not taking anything for it at this time.

## 2017-11-16 ENCOUNTER — Encounter: Payer: Self-pay | Admitting: Nurse Practitioner

## 2017-11-16 NOTE — Telephone Encounter (Signed)
Sent. Thanks.   

## 2017-11-16 NOTE — Telephone Encounter (Signed)
Last office visit 10/22/17 Medication is no longer on med list

## 2017-11-17 NOTE — Progress Notes (Signed)
I agree with the above note, plan 

## 2017-11-29 ENCOUNTER — Encounter: Admit: 2017-11-29 | Discharge: 2017-11-30 | Payer: MEDICARE

## 2017-11-29 DIAGNOSIS — K769 Liver disease, unspecified: Principal | ICD-10-CM

## 2017-11-29 DIAGNOSIS — K7460 Unspecified cirrhosis of liver: Secondary | ICD-10-CM

## 2017-11-29 DIAGNOSIS — R188 Other ascites: Secondary | ICD-10-CM | POA: Diagnosis not present

## 2017-12-03 ENCOUNTER — Ambulatory Visit: Admit: 2017-12-03 | Discharge: 2017-12-04

## 2017-12-03 ENCOUNTER — Encounter: Admit: 2017-12-03 | Discharge: 2017-12-03 | Payer: MEDICARE

## 2017-12-03 DIAGNOSIS — K769 Liver disease, unspecified: Principal | ICD-10-CM

## 2017-12-10 ENCOUNTER — Other Ambulatory Visit: Payer: Self-pay | Admitting: Family Medicine

## 2017-12-13 NOTE — Telephone Encounter (Signed)
Electronic refill request. [facility] Last office visit:   11/10/17 Last Filled:    30 capsule 1 11/16/2017  Please advise.

## 2017-12-14 NOTE — Telephone Encounter (Signed)
How is he already out of med?  He should have had 30 tabs with 1 RF prev.  Let me know.  Thanks.

## 2017-12-17 ENCOUNTER — Ambulatory Visit: Payer: 59 | Admitting: Internal Medicine

## 2017-12-21 ENCOUNTER — Other Ambulatory Visit: Payer: Self-pay | Admitting: Family Medicine

## 2017-12-22 ENCOUNTER — Other Ambulatory Visit: Payer: Self-pay | Admitting: *Deleted

## 2017-12-22 MED ORDER — AZELASTINE HCL 0.1 % NA SOLN: NASAL | 5 refills | Status: DC

## 2017-12-23 ENCOUNTER — Telehealth: Payer: Self-pay | Admitting: Family Medicine

## 2017-12-23 MED ORDER — HYDROXYZINE HCL 10 MG PO TABS: 10.0000 mg | ORAL_TABLET | Freq: Three times a day (TID) | ORAL | 0 refills | Status: DC | PRN

## 2017-12-23 NOTE — Telephone Encounter (Signed)
Patient advised.

## 2017-12-23 NOTE — Telephone Encounter (Signed)
Would try hydroxyzine, sedation caution.  Is usually tolerated well but still has sedation caution.  Rec getting a driver for the appointment.  Thanks.

## 2017-12-23 NOTE — Telephone Encounter (Signed)
Pt is having a dental procedure on 11.4.19 and need something for anxiety to take. Please advise pt. His pharmacy is [facility]

## 2018-01-04 ENCOUNTER — Encounter: Admit: 2018-01-04 | Discharge: 2018-01-04 | Payer: MEDICARE

## 2018-01-04 DIAGNOSIS — K7290 Hepatic failure, unspecified without coma: Principal | ICD-10-CM

## 2018-01-04 DIAGNOSIS — K766 Portal hypertension: Secondary | ICD-10-CM | POA: Diagnosis not present

## 2018-01-04 DIAGNOSIS — K7469 Other cirrhosis of liver: Secondary | ICD-10-CM | POA: Diagnosis not present

## 2018-01-04 DIAGNOSIS — K7460 Unspecified cirrhosis of liver: Secondary | ICD-10-CM | POA: Diagnosis not present

## 2018-01-04 DIAGNOSIS — R161 Splenomegaly, not elsewhere classified: Secondary | ICD-10-CM | POA: Diagnosis not present

## 2018-01-04 DIAGNOSIS — Z7682 Awaiting organ transplant status: Secondary | ICD-10-CM | POA: Diagnosis not present

## 2018-01-04 DIAGNOSIS — M8589 Other specified disorders of bone density and structure, multiple sites: Secondary | ICD-10-CM | POA: Diagnosis not present

## 2018-01-04 LAB — HEMOGLOBIN A1C: Hemoglobin A1C: 5.1

## 2018-01-05 ENCOUNTER — Ambulatory Visit: Admit: 2018-01-05 | Discharge: 2018-01-06 | Payer: MEDICARE | Attending: Registered" | Primary: Registered"

## 2018-01-05 ENCOUNTER — Encounter: Admit: 2018-01-05 | Discharge: 2018-01-06 | Payer: MEDICARE

## 2018-01-05 DIAGNOSIS — Z01818 Encounter for other preprocedural examination: Principal | ICD-10-CM

## 2018-01-05 DIAGNOSIS — K7290 Hepatic failure, unspecified without coma: Principal | ICD-10-CM

## 2018-01-05 DIAGNOSIS — K7460 Unspecified cirrhosis of liver: Secondary | ICD-10-CM | POA: Diagnosis not present

## 2018-01-05 DIAGNOSIS — Z7682 Awaiting organ transplant status: Secondary | ICD-10-CM | POA: Diagnosis not present

## 2018-01-06 ENCOUNTER — Encounter: Admit: 2018-01-06 | Discharge: 2018-01-07 | Payer: MEDICARE

## 2018-01-06 ENCOUNTER — Encounter: Admit: 2018-01-06 | Discharge: 2018-01-06 | Payer: MEDICARE

## 2018-01-06 DIAGNOSIS — K7290 Hepatic failure, unspecified without coma: Principal | ICD-10-CM

## 2018-01-06 DIAGNOSIS — I350 Nonrheumatic aortic (valve) stenosis: Secondary | ICD-10-CM | POA: Diagnosis not present

## 2018-01-06 DIAGNOSIS — I517 Cardiomegaly: Secondary | ICD-10-CM | POA: Diagnosis not present

## 2018-01-10 ENCOUNTER — Encounter: Admit: 2018-01-10 | Discharge: 2018-01-11 | Payer: MEDICARE

## 2018-01-10 DIAGNOSIS — K7290 Hepatic failure, unspecified without coma: Principal | ICD-10-CM

## 2018-01-10 DIAGNOSIS — G4733 Obstructive sleep apnea (adult) (pediatric): Secondary | ICD-10-CM

## 2018-01-10 DIAGNOSIS — K7460 Unspecified cirrhosis of liver: Secondary | ICD-10-CM

## 2018-01-10 DIAGNOSIS — E039 Hypothyroidism, unspecified: Secondary | ICD-10-CM

## 2018-01-10 DIAGNOSIS — I10 Essential (primary) hypertension: Secondary | ICD-10-CM

## 2018-01-11 ENCOUNTER — Ambulatory Visit (INDEPENDENT_AMBULATORY_CARE_PROVIDER_SITE_OTHER): Payer: 59

## 2018-01-11 DIAGNOSIS — Z23 Encounter for immunization: Secondary | ICD-10-CM

## 2018-01-11 NOTE — Progress Notes (Signed)
Patient in office today for annual flu vaccine and PPSV23 vaccine. Tolerated administration of both vaccines well. VIS for each vaccine given as directed.

## 2018-01-26 ENCOUNTER — Encounter: Payer: Self-pay | Admitting: Family Medicine

## 2018-02-08 ENCOUNTER — Encounter: Admit: 2018-02-08 | Discharge: 2018-02-09 | Payer: MEDICARE | Attending: Psychologist | Primary: Psychologist

## 2018-02-08 DIAGNOSIS — F40298 Other specified phobia: Principal | ICD-10-CM

## 2018-02-08 DIAGNOSIS — K7290 Hepatic failure, unspecified without coma: Secondary | ICD-10-CM | POA: Diagnosis not present

## 2018-02-08 DIAGNOSIS — K769 Liver disease, unspecified: Secondary | ICD-10-CM | POA: Diagnosis not present

## 2018-02-08 DIAGNOSIS — K7469 Other cirrhosis of liver: Secondary | ICD-10-CM | POA: Diagnosis not present

## 2018-02-10 MED ORDER — SERTRALINE 25 MG TABLET
ORAL_TABLET | Freq: Every day | ORAL | 3 refills | 0 days | Status: SS
Start: 2018-02-10 — End: 2018-09-22

## 2018-02-11 DIAGNOSIS — G4733 Obstructive sleep apnea (adult) (pediatric): Secondary | ICD-10-CM | POA: Diagnosis not present

## 2018-02-14 ENCOUNTER — Encounter: Payer: Self-pay | Admitting: Family Medicine

## 2018-02-14 ENCOUNTER — Ambulatory Visit: Payer: 59 | Admitting: Family Medicine

## 2018-02-14 VITALS — BP 124/72 | HR 64 | Resp 16 | Wt 187.0 lb

## 2018-02-14 DIAGNOSIS — R21 Rash and other nonspecific skin eruption: Secondary | ICD-10-CM

## 2018-02-14 DIAGNOSIS — K7460 Unspecified cirrhosis of liver: Secondary | ICD-10-CM | POA: Diagnosis not present

## 2018-02-14 MED ORDER — TRIAMCINOLONE ACETONIDE 0.5 % EX CREA: 1.0000 "application " | TOPICAL_CREAM | Freq: Two times a day (BID) | CUTANEOUS | 1 refills | Status: DC | PRN

## 2018-02-14 NOTE — Patient Instructions (Signed)
We'll call about setting you up with dermatology.   We make arrangements for referrals, extra imaging, and other appointments based on the urgency of the situation. Referrals are handled based on the clinical situation, not in the order that they are placed. If you do not see one of our referral coordinators on the way out of the clinic today, then you should expect a call in the next 1 to 2 weeks. We work diligently to process all referrals as quickly as possible.    Start using triamcinolone cream on the rash on your legs.  Use if needed.  If better, then okay to stop.   Unclear to me if the aches are related to the leg rash.  This is possible.  I need dermatology input.   Take care.  Glad to see you.

## 2018-02-14 NOTE — Progress Notes (Signed)
Rash.  He has h/o facial rash from CPAP mask, lonstanding.  Now with rash on the L > R shin, present for months.  Itchy.  Irritated but not painful.  No meds tried other than OTC lotions. No rash like that o/w.    He has been having more joint pain, elbows and knees.    He has had f/u with the liver clinic in the meantime.  He has been unable to work in the meantime.  He is applying for disability.  He had to stop working because of difficulty with focus and concentration.  He has difficulty with balance and fatigue.  All of that prevents him from working.  Meds, vitals, and allergies reviewed.   ROS: Per HPI unless specifically indicated in ROS section   Nad, B facial rash noted. ncat RRR SEM noted.  ctab abd soft. Not ttp  Blanching L>R well demarcated shin rash with chronic irritation with largest patch on L shin 8x16cm.   Faintly jaundiced but this is baseline for patient.  No BLE edema.

## 2018-02-15 NOTE — Assessment & Plan Note (Addendum)
Unable to work now.  I will write a letter on his behalf.  He is followed by hepatology.

## 2018-02-15 NOTE — Assessment & Plan Note (Signed)
I do not know if he has psoriatic changes on the skin, and I do not know if the joint pain is related.  Reasonable to start triamcinolone cream.  Routine cautions given.  Refer to dermatology.  Rationale discussed with patient.  He agrees.

## 2018-02-28 DIAGNOSIS — L719 Rosacea, unspecified: Secondary | ICD-10-CM | POA: Diagnosis not present

## 2018-02-28 DIAGNOSIS — L309 Dermatitis, unspecified: Secondary | ICD-10-CM | POA: Diagnosis not present

## 2018-03-02 ENCOUNTER — Encounter: Admit: 2018-03-02 | Discharge: 2018-03-03 | Payer: MEDICARE

## 2018-03-02 DIAGNOSIS — K7460 Unspecified cirrhosis of liver: Principal | ICD-10-CM

## 2018-03-02 DIAGNOSIS — K7290 Hepatic failure, unspecified without coma: Secondary | ICD-10-CM | POA: Diagnosis not present

## 2018-03-02 DIAGNOSIS — R188 Other ascites: Secondary | ICD-10-CM | POA: Diagnosis not present

## 2018-03-02 DIAGNOSIS — E46 Unspecified protein-calorie malnutrition: Secondary | ICD-10-CM | POA: Diagnosis not present

## 2018-03-02 DIAGNOSIS — Z6828 Body mass index (BMI) 28.0-28.9, adult: Secondary | ICD-10-CM | POA: Diagnosis not present

## 2018-03-02 DIAGNOSIS — E119 Type 2 diabetes mellitus without complications: Secondary | ICD-10-CM | POA: Diagnosis not present

## 2018-03-02 DIAGNOSIS — G9340 Encephalopathy, unspecified: Secondary | ICD-10-CM | POA: Diagnosis not present

## 2018-03-02 DIAGNOSIS — K7469 Other cirrhosis of liver: Secondary | ICD-10-CM | POA: Diagnosis not present

## 2018-03-02 DIAGNOSIS — I10 Essential (primary) hypertension: Secondary | ICD-10-CM | POA: Diagnosis not present

## 2018-03-21 ENCOUNTER — Ambulatory Visit: Admit: 2018-03-21 | Discharge: 2018-03-21 | Payer: MEDICARE

## 2018-03-21 ENCOUNTER — Encounter: Admit: 2018-03-21 | Discharge: 2018-03-21 | Payer: MEDICARE

## 2018-03-21 DIAGNOSIS — K7290 Hepatic failure, unspecified without coma: Secondary | ICD-10-CM

## 2018-03-21 DIAGNOSIS — Z01818 Encounter for other preprocedural examination: Principal | ICD-10-CM

## 2018-03-21 DIAGNOSIS — Z01810 Encounter for preprocedural cardiovascular examination: Secondary | ICD-10-CM | POA: Diagnosis not present

## 2018-03-21 DIAGNOSIS — I350 Nonrheumatic aortic (valve) stenosis: Secondary | ICD-10-CM | POA: Diagnosis not present

## 2018-03-21 DIAGNOSIS — I2510 Atherosclerotic heart disease of native coronary artery without angina pectoris: Secondary | ICD-10-CM | POA: Diagnosis not present

## 2018-03-21 HISTORY — PX: CARDIAC CATHETERIZATION: SHX172

## 2018-03-21 LAB — HM DIABETES EYE EXAM

## 2018-03-24 ENCOUNTER — Encounter: Payer: Self-pay | Admitting: Family Medicine

## 2018-03-28 ENCOUNTER — Ambulatory Visit: Admit: 2018-03-28 | Discharge: 2018-03-28 | Payer: MEDICARE

## 2018-03-28 DIAGNOSIS — K7290 Hepatic failure, unspecified without coma: Principal | ICD-10-CM

## 2018-03-28 DIAGNOSIS — I8390 Asymptomatic varicose veins of unspecified lower extremity: Secondary | ICD-10-CM | POA: Diagnosis not present

## 2018-03-28 DIAGNOSIS — K766 Portal hypertension: Secondary | ICD-10-CM | POA: Diagnosis not present

## 2018-03-28 DIAGNOSIS — K7469 Other cirrhosis of liver: Secondary | ICD-10-CM | POA: Diagnosis not present

## 2018-03-28 DIAGNOSIS — R188 Other ascites: Secondary | ICD-10-CM | POA: Diagnosis not present

## 2018-03-28 DIAGNOSIS — K7460 Unspecified cirrhosis of liver: Secondary | ICD-10-CM | POA: Diagnosis not present

## 2018-03-28 DIAGNOSIS — R161 Splenomegaly, not elsewhere classified: Secondary | ICD-10-CM | POA: Diagnosis not present

## 2018-04-11 ENCOUNTER — Ambulatory Visit: Admit: 2018-04-11 | Discharge: 2018-04-12 | Payer: MEDICARE

## 2018-04-11 ENCOUNTER — Encounter: Admit: 2018-04-11 | Discharge: 2018-04-12 | Payer: MEDICARE | Attending: Surgery | Primary: Surgery

## 2018-04-11 DIAGNOSIS — C220 Liver cell carcinoma: Principal | ICD-10-CM

## 2018-04-11 DIAGNOSIS — K7290 Hepatic failure, unspecified without coma: Principal | ICD-10-CM

## 2018-04-11 DIAGNOSIS — Z6828 Body mass index (BMI) 28.0-28.9, adult: Secondary | ICD-10-CM | POA: Diagnosis not present

## 2018-04-11 DIAGNOSIS — I359 Nonrheumatic aortic valve disorder, unspecified: Secondary | ICD-10-CM | POA: Diagnosis not present

## 2018-04-11 DIAGNOSIS — K7460 Unspecified cirrhosis of liver: Secondary | ICD-10-CM | POA: Diagnosis not present

## 2018-04-11 DIAGNOSIS — K829 Disease of gallbladder, unspecified: Secondary | ICD-10-CM | POA: Diagnosis not present

## 2018-04-14 DIAGNOSIS — K7290 Hepatic failure, unspecified without coma: Secondary | ICD-10-CM | POA: Diagnosis not present

## 2018-04-18 ENCOUNTER — Other Ambulatory Visit: Payer: Self-pay | Admitting: Internal Medicine

## 2018-04-25 ENCOUNTER — Ambulatory Visit (INDEPENDENT_AMBULATORY_CARE_PROVIDER_SITE_OTHER): Payer: BLUE CROSS/BLUE SHIELD | Admitting: Internal Medicine

## 2018-04-25 ENCOUNTER — Encounter: Payer: Self-pay | Admitting: Internal Medicine

## 2018-04-25 VITALS — BP 122/76 | HR 57 | Ht 68.0 in | Wt 188.1 lb

## 2018-04-25 DIAGNOSIS — K7290 Hepatic failure, unspecified without coma: Principal | ICD-10-CM

## 2018-04-25 DIAGNOSIS — Q231 Congenital insufficiency of aortic valve: Secondary | ICD-10-CM

## 2018-04-25 DIAGNOSIS — E782 Mixed hyperlipidemia: Secondary | ICD-10-CM | POA: Diagnosis not present

## 2018-04-25 DIAGNOSIS — I2510 Atherosclerotic heart disease of native coronary artery without angina pectoris: Secondary | ICD-10-CM

## 2018-04-25 DIAGNOSIS — Q2381 Bicuspid aortic valve: Secondary | ICD-10-CM

## 2018-04-25 DIAGNOSIS — I10 Essential (primary) hypertension: Secondary | ICD-10-CM | POA: Diagnosis not present

## 2018-04-25 DIAGNOSIS — K7460 Unspecified cirrhosis of liver: Secondary | ICD-10-CM

## 2018-04-25 NOTE — Progress Notes (Addendum)
Cardiology Office Note   Date:  04/25/2018   ID:  [name], DOB 1961/01/04, MRN [id number]  PCP:  [name], MD  Cardiologist:   Dorris Carnes, MD   F/U of aortic stenosis.     History of Present Illness: [name] is a 58 y.o. male with a history of aortic stenosis.  Also a history of liver problems  Being followed by GI   Hx of cirrhosis with portal Hx  (EtOH in past) B Blocker used for BP I saw him last year  Echo in September 2018showed stable moderate AS    SInce I saw him he has been seen at [facility]   Getting eval for liver Transplant  The pt had dobutamine echo in November that was normal Echo done that showed mild to moderate AS with a mean gradient of 16 mm Hg The pt went on to have R and L heart cath as well   This showed 40% D1(small)   30 to 40% (D2)    40 to 50% (OM2)   Mild plaquing  Elsewhere   LVEF normal    Mld AS with 39 mm greadient  Area 1.4 cm2  The pt denies CP   Breathing is OK     Having radiofrequency ablation of liver nodule (tumor) next week    Current Meds  Medication Sig  . amLODipine (NORVASC) 5 MG tablet TAKE 0.5 TABLETS (2.5 MG TOTAL) BY MOUTH DAILY.  [name] azelastine (ASTELIN) 0.1 % nasal spray PLACE 2 SPRAYS INTO BOTH NOSTRILS TWICE DAILY AS NEEDED  . cholestyramine light (PREVALITE) 4 GM/DOSE powder DISSOLVE 1 SCOOP IN LIQUID AND TAKE BY MOUTH TWICE DAILY  . Colchicine (MITIGARE) 0.6 MG CAPS Take 1 tablet by mouth daily as needed. For gout  . fluticasone (FLONASE) 50 MCG/ACT nasal spray PLACE ONE OR TWO SPRAYS INTO BOTH NOSTRILS DAILY AS NEEDED.  . fluticasone (FLOVENT HFA) 110 MCG/ACT inhaler Inhale 2 puffs into the lungs 2 (two) times daily. Rinse after use.  . hydrOXYzine (ATARAX/VISTARIL) 10 MG tablet Take 1 tablet (10 mg total) by mouth 3 (three) times daily as needed for anxiety (sedation caution).  [name] ibuprofen (ADVIL,MOTRIN) 200 MG tablet Take 200 mg by mouth daily as needed for headache.  . Insulin Glargine (BASAGLAR KWIKPEN) 100  UNIT/ML SOPN INJECT 5 - 30 UNITS INTO THE SKIN DAILY AT 10 PM.  . Insulin Pen Needle (PEN NEEDLES) 31G X 5 MM MISC 1 Device by Does not apply route daily.  [name] lactulose (CHRONULAC) 10 GM/15ML solution Take 10 g by mouth 2 (two) times daily.   [name] levothyroxine (SYNTHROID, LEVOTHROID) 137 MCG tablet TAKE 1 TABLET BY MOUTH ONCE DAILY BEFORE BREAKFAST  . milk thistle 175 MG tablet Take 175 mg by mouth daily.  . Multiple Vitamin (MULTIVITAMIN) tablet Take 1 tablet by mouth daily.  . Omega-3 Fatty Acids (FISH OIL PO) Take 1 capsule by mouth 3 (three) times a week.  . ondansetron (ZOFRAN) 4 MG tablet Take 1 tablet (4 mg total) by mouth 2 (two) times daily.  . sertraline (ZOLOFT) 25 MG tablet Take 25 mg by mouth daily.  [name] triamcinolone cream (KENALOG) 0.5 % Apply 1 application topically 2 (two) times daily as needed.  . ursodiol (ACTIGALL) 500 MG tablet Take 500 mg by mouth 2 (two) times daily.  . VENTOLIN HFA 108 (90 Base) MCG/ACT inhaler INHALE 2 PUFFS INTO THE LUNGS EVERY 6 HOURS AS NEEDED.  [name] XIFAXAN 550 MG TABS tablet   .  zinc gluconate 50 MG tablet Take 50 mg by mouth daily.     Allergies:   Watermelon flavor   Past Medical History:  Diagnosis Date  . Allergy   . Asthma   . Bicuspid aortic valve    sees dr Harrington Challenger  . Cirrhosis ([place])   . Diabetes mellitus without complication ([place])   . Fatty liver    with h/o elevated LFT's  . GERD (gastroesophageal reflux disease)   . Heart murmur   . Hypertension   . Itching    all over last few months  . Jaundice   . Migraine with aura   . OSA (obstructive sleep apnea) 09/14/2011   PSG 11/08/11>>AHI 31.6, SpO2 low 85%. wears CPAP, pt does not know settings  . Other abnormal glucose    diet controlled diabetic  . Thyroid disease     Past Surgical History:  Procedure Laterality Date  . APPENDECTOMY  10/09   Emergency  . BIOPSY THYROID  08/19/07   Attempted, no tissue obtained  . CARDIAC CATHETERIZATION  03/21/2018  . CARDIOVASCULAR STRESS TEST   03/07   Negative 06/05  . DOPPLER ECHOCARDIOGRAPHY  06/04  . ESOPHAGEAL BANDING N/A 05/04/2016   Procedure: ESOPHAGEAL BANDING;  Surgeon: [name], MD;  Location: [facility];  Service: Endoscopy;  Laterality: N/A;  . ESOPHAGOGASTRODUODENOSCOPY (EGD) WITH PROPOFOL N/A 05/04/2016   Procedure: ESOPHAGOGASTRODUODENOSCOPY (EGD) WITH PROPOFOL;  Surgeon: [name], MD;  Location: [facility];  Service: Endoscopy;  Laterality: N/A;     Social History:  The patient  reports that he has never smoked. He has never used smokeless tobacco. He reports that he does not drink alcohol or use drugs.   Family History:  The patient's family history includes Asthma in his mother; COPD in his father; Cancer in his father; Colon cancer in his maternal grandmother; Heart disease in his maternal grandfather and sister; Liver disease in his sister; Prostate cancer in his maternal uncle.    ROS:  Please see the history of present illness. All other systems are reviewed and  Negative to the above problem except as noted.    PHYSICAL EXAM: VS:  BP 122/76   Pulse (!) 57   Ht 5\' 8"  (1.727 m)   Wt 188 lb 1.9 oz (85.3 kg)   BMI 28.60 kg/m   GEN: Well nourished, well developed, in no acute distress  HEENT: normal   Mild icterus of sclera Neck: JVP not elevated   No carotid bruits, or masses Cardiac: RRR  Gr III/VI systolic murmur base  , rubs, or gallops,no edema  Respiratory:  clear to auscultation bilaterally, normal work of breathing GI: soft, nontender, nondistended, + BS  No hepatomegaly  MS: no deformity Moving all extremities   Skin: warm and dry, no rash  Neuro:  Strength and sensation are intact Psych: euthymic mood, full affect   EKG:  EKG is not ordered today.    Lipid Panel    Component Value Date/Time   CHOL 195 01/11/2017 0821   TRIG 194.0 (H) 01/11/2017 0821   HDL 25.00 (L) 01/11/2017 0821   CHOLHDL 8 01/11/2017 0821   VLDL 38.8 01/11/2017 0821   LDLCALC 131 (H) 01/11/2017  0821   LDLDIRECT 196.0 08/02/2012 0759      Wt Readings from Last 3 Encounters:  04/25/18 188 lb 1.9 oz (85.3 kg)  02/14/18 187 lb (84.8 kg)  11/10/17 178 lb 12.8 oz (81.1 kg)      ASSESSMENT AND PLAN:  1  HTN  BP is normal  2  Aortic stenosis   Mild at cath    Murmur is more improessive   FOllow    3  Liver dz  On list for liver  4  CAD   Mild at cath   WIll check with statin use   Pt is asymptomatic    5    HL   WIll need to review use of statin in him with pharmacy      Plan otherwise for f/u in 1 year     Current medicines are reviewed at length with the patient today.  The patient does not have concerns regarding medicines.  Signed, Dorris Carnes, MD  04/25/2018 8:27 AM    [facility] [address], [place], [place]  [address] Phone: [phone]; Fax: [phone]

## 2018-04-25 NOTE — Patient Instructions (Signed)
Medication Instructions:  No changes If you need a refill on your cardiac medications before your next appointment, please call your pharmacy.   Lab work: none If you have labs (blood work) drawn today and your tests are completely normal, you will receive your results only by: . MyChart Message (if you have MyChart) OR . A paper copy in the mail If you have any lab test that is abnormal or we need to change your treatment, we will call you to review the results.  Testing/Procedures: none  Follow-Up: At [facility], you and your health needs are our priority.  As part of our continuing mission to provide you with exceptional heart care, we have created designated Provider Care Teams.  These Care Teams include your primary Cardiologist (physician) and Advanced Practice Providers (APPs -  Physician Assistants and Nurse Practitioners) who all work together to provide you with the care you need, when you need it. You will need a follow up appointment in:  12 months.  Please call our office 2 months in advance to schedule this appointment.  You may see Paula Ross, MD or one of the following Advanced Practice Providers on your designated Care Team: Scott Weaver, PA-C Vin Bhagat, PA-C . Janine Hammond, NP  Any Other Special Instructions Will Be Listed Below (If Applicable).    

## 2018-04-28 ENCOUNTER — Other Ambulatory Visit: Payer: Self-pay | Admitting: *Deleted

## 2018-04-28 ENCOUNTER — Other Ambulatory Visit: Payer: Self-pay | Admitting: Family Medicine

## 2018-04-28 DIAGNOSIS — C220 Liver cell carcinoma: Secondary | ICD-10-CM | POA: Diagnosis not present

## 2018-04-28 DIAGNOSIS — K7469 Other cirrhosis of liver: Secondary | ICD-10-CM | POA: Diagnosis not present

## 2018-04-30 NOTE — Progress Notes (Signed)
Reviewed with pharmacy (lipid clinic) Will hold off on any medicine changes    Follow lipids after transplant

## 2018-05-02 DIAGNOSIS — K769 Liver disease, unspecified: Principal | ICD-10-CM

## 2018-05-02 DIAGNOSIS — G4733 Obstructive sleep apnea (adult) (pediatric): Principal | ICD-10-CM

## 2018-05-02 DIAGNOSIS — R188 Other ascites: Principal | ICD-10-CM

## 2018-05-02 DIAGNOSIS — C220 Liver cell carcinoma: Principal | ICD-10-CM

## 2018-05-02 DIAGNOSIS — Z800 Family history of malignant neoplasm of digestive organs: Principal | ICD-10-CM

## 2018-05-02 DIAGNOSIS — E119 Type 2 diabetes mellitus without complications: Principal | ICD-10-CM

## 2018-05-02 DIAGNOSIS — K7469 Other cirrhosis of liver: Principal | ICD-10-CM

## 2018-05-02 DIAGNOSIS — K219 Gastro-esophageal reflux disease without esophagitis: Principal | ICD-10-CM

## 2018-05-02 DIAGNOSIS — Z9989 Dependence on other enabling machines and devices: Principal | ICD-10-CM

## 2018-05-02 DIAGNOSIS — Z79899 Other long term (current) drug therapy: Principal | ICD-10-CM

## 2018-05-02 DIAGNOSIS — E039 Hypothyroidism, unspecified: Principal | ICD-10-CM

## 2018-05-02 DIAGNOSIS — E785 Hyperlipidemia, unspecified: Principal | ICD-10-CM

## 2018-05-02 DIAGNOSIS — I10 Essential (primary) hypertension: Principal | ICD-10-CM

## 2018-05-02 DIAGNOSIS — Z794 Long term (current) use of insulin: Principal | ICD-10-CM

## 2018-05-02 DIAGNOSIS — K7290 Hepatic failure, unspecified without coma: Principal | ICD-10-CM

## 2018-05-03 ENCOUNTER — Encounter: Admit: 2018-05-03 | Discharge: 2018-05-03 | Payer: MEDICARE

## 2018-05-03 DIAGNOSIS — E119 Type 2 diabetes mellitus without complications: Principal | ICD-10-CM

## 2018-05-03 DIAGNOSIS — Z794 Long term (current) use of insulin: Principal | ICD-10-CM

## 2018-05-03 DIAGNOSIS — I10 Essential (primary) hypertension: Principal | ICD-10-CM

## 2018-05-03 DIAGNOSIS — I350 Nonrheumatic aortic (valve) stenosis: Principal | ICD-10-CM

## 2018-05-03 DIAGNOSIS — R188 Other ascites: Principal | ICD-10-CM

## 2018-05-03 DIAGNOSIS — Z800 Family history of malignant neoplasm of digestive organs: Principal | ICD-10-CM

## 2018-05-03 DIAGNOSIS — Z148 Genetic carrier of other disease: Principal | ICD-10-CM

## 2018-05-03 DIAGNOSIS — G4730 Sleep apnea, unspecified: Principal | ICD-10-CM

## 2018-05-03 DIAGNOSIS — K769 Liver disease, unspecified: Principal | ICD-10-CM

## 2018-05-03 DIAGNOSIS — C220 Liver cell carcinoma: Principal | ICD-10-CM

## 2018-05-03 DIAGNOSIS — Z9989 Dependence on other enabling machines and devices: Principal | ICD-10-CM

## 2018-05-03 DIAGNOSIS — Z79899 Other long term (current) drug therapy: Principal | ICD-10-CM

## 2018-05-03 DIAGNOSIS — K745 Biliary cirrhosis, unspecified: Principal | ICD-10-CM

## 2018-05-03 DIAGNOSIS — G4733 Obstructive sleep apnea (adult) (pediatric): Principal | ICD-10-CM

## 2018-05-03 DIAGNOSIS — E785 Hyperlipidemia, unspecified: Principal | ICD-10-CM

## 2018-05-03 DIAGNOSIS — K7469 Other cirrhosis of liver: Principal | ICD-10-CM

## 2018-05-03 DIAGNOSIS — K8309 Other cholangitis: Principal | ICD-10-CM

## 2018-05-03 DIAGNOSIS — K7290 Hepatic failure, unspecified without coma: Principal | ICD-10-CM

## 2018-05-03 DIAGNOSIS — E039 Hypothyroidism, unspecified: Principal | ICD-10-CM

## 2018-05-03 DIAGNOSIS — K219 Gastro-esophageal reflux disease without esophagitis: Principal | ICD-10-CM

## 2018-05-03 MED ORDER — OXYCODONE 5 MG TABLET
ORAL_TABLET | Freq: Once | ORAL | 0 refills | 0.00000 days | Status: CP | PRN
Start: 2018-05-03 — End: 2018-05-03
  Filled 2018-05-03: qty 30, 7d supply, fill #0

## 2018-05-03 MED ORDER — OXYCODONE 5 MG TABLET: 5 mg | tablet | Freq: Four times a day (QID) | 0 refills | 0 days | Status: SS

## 2018-05-03 MED ORDER — OXYCODONE 5 MG TABLET: 5 mg | tablet | Freq: Four times a day (QID) | 0 refills | 0 days | Status: AC

## 2018-05-03 MED FILL — OXYCODONE 5 MG TABLET: 7 days supply | Qty: 30 | Fill #0 | Status: AC

## 2018-05-04 DIAGNOSIS — K7290 Hepatic failure, unspecified without coma: Principal | ICD-10-CM

## 2018-05-04 DIAGNOSIS — I350 Nonrheumatic aortic (valve) stenosis: Principal | ICD-10-CM

## 2018-05-04 DIAGNOSIS — I10 Essential (primary) hypertension: Principal | ICD-10-CM

## 2018-05-04 DIAGNOSIS — E119 Type 2 diabetes mellitus without complications: Principal | ICD-10-CM

## 2018-05-04 DIAGNOSIS — Z148 Genetic carrier of other disease: Principal | ICD-10-CM

## 2018-05-04 DIAGNOSIS — K745 Biliary cirrhosis, unspecified: Principal | ICD-10-CM

## 2018-05-04 DIAGNOSIS — G4730 Sleep apnea, unspecified: Principal | ICD-10-CM

## 2018-05-04 DIAGNOSIS — K8309 Other cholangitis: Principal | ICD-10-CM

## 2018-05-09 DIAGNOSIS — K7290 Hepatic failure, unspecified without coma: Principal | ICD-10-CM

## 2018-05-09 NOTE — Progress Notes (Signed)
Send MyChart message to patient to inform.

## 2018-05-13 ENCOUNTER — Other Ambulatory Visit: Payer: Self-pay | Admitting: Internal Medicine

## 2018-05-16 DIAGNOSIS — K7290 Hepatic failure, unspecified without coma: Principal | ICD-10-CM

## 2018-05-23 DIAGNOSIS — K7290 Hepatic failure, unspecified without coma: Principal | ICD-10-CM

## 2018-05-30 ENCOUNTER — Ambulatory Visit: Admit: 2018-05-30 | Discharge: 2018-05-31 | Payer: MEDICARE

## 2018-05-30 ENCOUNTER — Encounter
Admit: 2018-05-30 | Discharge: 2018-05-31 | Payer: MEDICARE | Attending: Physician Assistant | Primary: Physician Assistant

## 2018-05-30 ENCOUNTER — Non-Acute Institutional Stay: Admit: 2018-05-30 | Discharge: 2018-05-31 | Payer: MEDICARE

## 2018-05-30 ENCOUNTER — Encounter: Admit: 2018-05-30 | Discharge: 2018-05-31 | Payer: MEDICARE

## 2018-05-30 DIAGNOSIS — C220 Liver cell carcinoma: Principal | ICD-10-CM

## 2018-05-30 DIAGNOSIS — R188 Other ascites: Secondary | ICD-10-CM | POA: Diagnosis not present

## 2018-05-30 DIAGNOSIS — J90 Pleural effusion, not elsewhere classified: Secondary | ICD-10-CM | POA: Diagnosis not present

## 2018-05-30 DIAGNOSIS — J9811 Atelectasis: Secondary | ICD-10-CM | POA: Diagnosis not present

## 2018-05-30 DIAGNOSIS — R161 Splenomegaly, not elsewhere classified: Secondary | ICD-10-CM | POA: Diagnosis not present

## 2018-05-30 DIAGNOSIS — K7460 Unspecified cirrhosis of liver: Secondary | ICD-10-CM | POA: Diagnosis not present

## 2018-05-30 DIAGNOSIS — I8390 Asymptomatic varicose veins of unspecified lower extremity: Secondary | ICD-10-CM | POA: Diagnosis not present

## 2018-05-30 DIAGNOSIS — K766 Portal hypertension: Secondary | ICD-10-CM | POA: Diagnosis not present

## 2018-06-02 DIAGNOSIS — K7290 Hepatic failure, unspecified without coma: Secondary | ICD-10-CM | POA: Diagnosis not present

## 2018-06-08 MED ORDER — FUROSEMIDE 20 MG TABLET
ORAL_TABLET | Freq: Every day | ORAL | 11 refills | 0.00000 days | Status: CP
Start: 2018-06-08 — End: 2018-07-13

## 2018-06-08 MED ORDER — SPIRONOLACTONE 50 MG TABLET
ORAL_TABLET | Freq: Every day | ORAL | 11 refills | 0 days | Status: CP
Start: 2018-06-08 — End: 2018-07-13

## 2018-06-13 ENCOUNTER — Other Ambulatory Visit: Payer: Self-pay | Admitting: Family Medicine

## 2018-06-13 DIAGNOSIS — E039 Hypothyroidism, unspecified: Secondary | ICD-10-CM

## 2018-06-14 NOTE — Telephone Encounter (Signed)
Last prescribed on 06/07/2017 . Last office visit on 02/14/2018. No future appointment

## 2018-06-14 NOTE — Telephone Encounter (Signed)
Please call patient and schedule lab appointment as instructed. 

## 2018-06-14 NOTE — Telephone Encounter (Signed)
rx sent.  He has a lot going on.  Reasonable to check TSH here when possible.  Thanks.  Lab ordered.

## 2018-06-14 NOTE — Telephone Encounter (Signed)
Pt scheduled for labs on 06/17/18 @ 8:45am

## 2018-06-17 ENCOUNTER — Other Ambulatory Visit (INDEPENDENT_AMBULATORY_CARE_PROVIDER_SITE_OTHER): Payer: BLUE CROSS/BLUE SHIELD

## 2018-06-17 ENCOUNTER — Other Ambulatory Visit: Payer: Self-pay

## 2018-06-17 DIAGNOSIS — E039 Hypothyroidism, unspecified: Secondary | ICD-10-CM | POA: Diagnosis not present

## 2018-06-17 LAB — TSH: TSH: 43.17 u[IU]/mL — ABNORMAL HIGH (ref 0.35–4.50)

## 2018-06-18 ENCOUNTER — Other Ambulatory Visit: Payer: Self-pay | Admitting: Family Medicine

## 2018-06-20 DIAGNOSIS — G4733 Obstructive sleep apnea (adult) (pediatric): Secondary | ICD-10-CM | POA: Diagnosis not present

## 2018-06-20 DIAGNOSIS — K7290 Hepatic failure, unspecified without coma: Secondary | ICD-10-CM | POA: Diagnosis not present

## 2018-06-20 NOTE — Telephone Encounter (Signed)
Request by pharmacy for refill on patients insulin pen needles.  Last Refill: #100,  3 refills on 05/07/17 LAST OV: 02/14/18  NEXT OV: no future appt scheduled.  Per 4/20 refill note, Dr. Damita Dunnings aware patient has a lot going on right now, he has been set up for lab appointment.    Will refill supplies for short term until he is able to follow up.

## 2018-06-21 ENCOUNTER — Other Ambulatory Visit: Payer: Self-pay | Admitting: Family Medicine

## 2018-06-21 DIAGNOSIS — E039 Hypothyroidism, unspecified: Secondary | ICD-10-CM

## 2018-06-21 MED ORDER — LEVOTHYROXINE SODIUM 150 MCG PO TABS: 150.0000 ug | ORAL_TABLET | Freq: Every day | ORAL | 1 refills | Status: DC

## 2018-07-04 ENCOUNTER — Encounter: Admit: 2018-07-04 | Discharge: 2018-07-05 | Payer: MEDICARE

## 2018-07-04 DIAGNOSIS — K7290 Hepatic failure, unspecified without coma: Principal | ICD-10-CM

## 2018-07-12 ENCOUNTER — Other Ambulatory Visit: Payer: Self-pay | Admitting: Family Medicine

## 2018-07-12 ENCOUNTER — Encounter: Admit: 2018-07-12 | Discharge: 2018-07-13 | Payer: MEDICARE

## 2018-07-12 DIAGNOSIS — K7290 Hepatic failure, unspecified without coma: Principal | ICD-10-CM

## 2018-07-12 DIAGNOSIS — K7460 Unspecified cirrhosis of liver: Secondary | ICD-10-CM | POA: Diagnosis not present

## 2018-07-12 DIAGNOSIS — K766 Portal hypertension: Secondary | ICD-10-CM | POA: Diagnosis not present

## 2018-07-12 DIAGNOSIS — R161 Splenomegaly, not elsewhere classified: Secondary | ICD-10-CM | POA: Diagnosis not present

## 2018-07-12 DIAGNOSIS — Z944 Liver transplant status: Secondary | ICD-10-CM | POA: Diagnosis not present

## 2018-07-13 MED ORDER — FUROSEMIDE 20 MG TABLET
ORAL_TABLET | Freq: Every day | ORAL | 11 refills | 0.00000 days | Status: CP
Start: 2018-07-13 — End: 2018-09-23

## 2018-07-13 MED ORDER — SPIRONOLACTONE 50 MG TABLET
ORAL_TABLET | Freq: Every day | ORAL | 11 refills | 0.00000 days | Status: CP
Start: 2018-07-13 — End: 2018-09-23

## 2018-07-20 ENCOUNTER — Encounter: Payer: Self-pay | Admitting: Family Medicine

## 2018-07-21 NOTE — Telephone Encounter (Signed)
Dr Damita Dunnings, it looks like they cover Lantus on a Tier 3 and not Basaglar. Please make appropriate changes. Thanks

## 2018-07-22 ENCOUNTER — Other Ambulatory Visit: Payer: Self-pay | Admitting: Family Medicine

## 2018-07-22 DIAGNOSIS — E119 Type 2 diabetes mellitus without complications: Secondary | ICD-10-CM

## 2018-07-22 MED ORDER — INSULIN GLARGINE 100 UNIT/ML SOLOSTAR PEN: 5.0000 [IU] | PEN_INJECTOR | Freq: Every day | SUBCUTANEOUS | 99 refills | Status: DC

## 2018-07-23 ENCOUNTER — Encounter: Admit: 2018-07-23 | Discharge: 2018-07-23 | Payer: MEDICARE

## 2018-07-23 ENCOUNTER — Ambulatory Visit: Admit: 2018-07-23 | Discharge: 2018-07-23 | Payer: MEDICARE

## 2018-07-25 ENCOUNTER — Other Ambulatory Visit (INDEPENDENT_AMBULATORY_CARE_PROVIDER_SITE_OTHER): Payer: BLUE CROSS/BLUE SHIELD

## 2018-07-25 DIAGNOSIS — E119 Type 2 diabetes mellitus without complications: Secondary | ICD-10-CM | POA: Diagnosis not present

## 2018-07-25 DIAGNOSIS — E039 Hypothyroidism, unspecified: Secondary | ICD-10-CM

## 2018-07-25 LAB — HEMOGLOBIN A1C: Hgb A1c MFr Bld: 4.6 % (ref 4.6–6.5)

## 2018-07-25 LAB — TSH: TSH: 20.1 u[IU]/mL — ABNORMAL HIGH (ref 0.35–4.50)

## 2018-07-26 ENCOUNTER — Encounter: Admit: 2018-07-26 | Discharge: 2018-07-27 | Payer: MEDICARE

## 2018-07-26 DIAGNOSIS — K7460 Unspecified cirrhosis of liver: Principal | ICD-10-CM

## 2018-07-26 DIAGNOSIS — K7290 Hepatic failure, unspecified without coma: Secondary | ICD-10-CM

## 2018-07-26 DIAGNOSIS — C220 Liver cell carcinoma: Principal | ICD-10-CM

## 2018-07-26 DIAGNOSIS — N132 Hydronephrosis with renal and ureteral calculous obstruction: Secondary | ICD-10-CM | POA: Diagnosis not present

## 2018-07-28 ENCOUNTER — Encounter: Payer: Self-pay | Admitting: Family Medicine

## 2018-07-28 ENCOUNTER — Ambulatory Visit (INDEPENDENT_AMBULATORY_CARE_PROVIDER_SITE_OTHER): Payer: BC Managed Care – PPO | Admitting: Family Medicine

## 2018-07-28 DIAGNOSIS — N200 Calculus of kidney: Secondary | ICD-10-CM | POA: Diagnosis not present

## 2018-07-28 DIAGNOSIS — E119 Type 2 diabetes mellitus without complications: Secondary | ICD-10-CM | POA: Diagnosis not present

## 2018-07-28 DIAGNOSIS — K7460 Unspecified cirrhosis of liver: Secondary | ICD-10-CM

## 2018-07-28 DIAGNOSIS — E039 Hypothyroidism, unspecified: Secondary | ICD-10-CM | POA: Diagnosis not present

## 2018-07-28 DIAGNOSIS — J45909 Unspecified asthma, uncomplicated: Secondary | ICD-10-CM | POA: Diagnosis not present

## 2018-07-28 MED ORDER — AMLODIPINE BESYLATE 5 MG PO TABS: 5.0000 mg | ORAL_TABLET | Freq: Every day | ORAL | Status: DC

## 2018-07-28 MED ORDER — LEVOTHYROXINE SODIUM 150 MCG PO TABS: 150.0000 ug | ORAL_TABLET | Freq: Every day | ORAL | Status: DC

## 2018-07-28 MED ORDER — INSULIN GLARGINE 100 UNIT/ML SOLOSTAR PEN: 8.0000 [IU] | PEN_INJECTOR | Freq: Every day | SUBCUTANEOUS | Status: DC

## 2018-07-28 NOTE — Assessment & Plan Note (Signed)
Liver disease d/w pt.  Per [facility] team.  I'll defer.  He agrees.   He has f/u pending with [facility].   Recent PET: IMPRESSION: - No evidence of metabolically active malignant transformation. (Note that hepatocellular carcinoma is often poorly FDG avid.) Treated segment 6 lesion is photopenic likely reflecting treated disease. - 2 mm distal left ureteral calculus without hydroureteronephrosis.

## 2018-07-28 NOTE — Progress Notes (Signed)
Interactive audio and video telecommunications were attempted between this provider and patient, however failed, due to patient having technical difficulties OR patient did not have access to video capability.  We continued and completed visit with audio only.   Virtual Visit via Telephone Note  I connected with patient on 07/28/18 at 8:18 AM by telephone and verified that I am speaking with the correct person using two identifiers.  Location of patient: home  Location of MD: [place] Name of referring provider (if blank then none associated): Names per persons and role in encounter:  MD: Earlyne Iba, Patient: name listed above.    I discussed the limitations, risks, security and privacy concerns of performing an evaluation and management service by telephone and the availability of in person appointments. I also discussed with the patient that there may be a patient responsible charge related to this service. The patient expressed understanding and agreed to proceed.  CC: f/u.   Pandemic considerations d/w pt.    History of Present Illness:  Diabetes:  Using medications without difficulties: yes, taking 10 units of insulin per day.   Hypoglycemic episodes: once, not recently, he cut back on insulin at that point.  Cautions d/w pt.   Hyperglycemic episodes: no Feet problems: no Blood Sugars averaging: usually 70-100 A1c controlled. He can cut back to 8 units, then cut back more if needed.  D/w pt.  He agrees.   Has been taking amlodipine 5mg  and BP has been controlled per patient report.  No CP, he has minimal SOB that he attributed to deconditioning, BLE edema.    Hypothyroidism-  TSH better but still elevated, d/w pt. No neck mass.  Compliant.  Would inc to 1.5 tabs on Sundays and take 1 a day o/w.  7.5 tabs in 1 week.  Recheck TSH in about 3 with another A1c.  He has fatigue, likely from mult issues, d/w pt.    Liver disease d/w pt.  Per [facility] team.  I'll defer.  He  agrees.   He has f/u pending with [facility].   Recent PET: IMPRESSION: - No evidence of metabolically active malignant transformation. (Note that hepatocellular carcinoma is often poorly FDG avid.) Treated segment 6 lesion is photopenic likely reflecting treated disease. - 2 mm distal left ureteral calculus without hydroureteronephrosis.   He hasn't had any stone passage. Recent abd pain or blood in urine.  D/w pt about the above.     Overall use of SABA d/w pt.  No recent use in the last few days.  Used prn.  He'll update me if needed frequently.    PMH, SH d/w pt.    Observations/Objective:nad Speech wnl    Assessment and Plan:  DM. A1c controlled. He can cut back to 8 units, then cut back more if needed.  D/w pt.  He agrees. Recheck in about 3 months.  udpate me as needed.    Hypothyroidism-  TSH better but still elevated, d/w pt. No neck mass.  Compliant.  Would inc to 1.5 tabs on Sundays and take 1 a day o/w.  7.5 tabs in 1 week.  Recheck TSH in about 3 with another A1c.  He has fatigue, likely from mult issues, d/w pt.    Liver disease d/w pt.  Per [facility] team.  I'll defer.  He agrees.   He has f/u pending with [facility].   Recent PET: IMPRESSION: - No evidence of metabolically active malignant transformation. (Note that hepatocellular carcinoma is often poorly  FDG avid.) Treated segment 6 lesion is photopenic likely reflecting treated disease. - 2 mm distal left ureteral calculus without hydroureteronephrosis.   Renal stone.  No sx, observe for now.  He hasn't had any known stone passage. Recent abd pain or blood in urine.  D/w pt about the above.     Asthma. Overall use of SABA d/w pt.  No recent use in the last few days.  Used prn.  He'll update me if needed frequently.    Follow Up Instructions:see above.    I discussed the assessment and treatment plan with the patient. The patient was provided an opportunity to ask questions and all were answered. The patient agreed with the plan  and demonstrated an understanding of the instructions.   The patient was advised to call back or seek an in-person evaluation if the symptoms worsen or if the condition fails to improve as anticipated.  I provided 21 minutes of non-face-to-face time during this encounter.  Elsie Stain, MD

## 2018-07-28 NOTE — Assessment & Plan Note (Signed)
Overall use of SABA d/w pt.  No recent use in the last few days.  Used prn.  He'll update me if needed frequently.

## 2018-07-28 NOTE — Assessment & Plan Note (Signed)
A1c controlled. He can cut back to 8 units, then cut back more if needed.  D/w pt.  He agrees. Recheck in about 3 months.  udpate me as needed.

## 2018-07-28 NOTE — Assessment & Plan Note (Signed)
Renal stone.  No sx, observe for now.  He hasn't had any known stone passage. Recent abd pain or blood in urine.  D/w pt about the above.   He is drinking adequate fluid, d/w pt.

## 2018-07-28 NOTE — Assessment & Plan Note (Signed)
TSH better but still elevated, d/w pt. No neck mass.  Compliant.  Would inc to 1.5 tabs on Sundays and take 1 a day o/w.  7.5 tabs in 1 week.  Recheck TSH in about 3 with another A1c.  He has fatigue, likely from mult issues, d/w pt.

## 2018-08-16 ENCOUNTER — Other Ambulatory Visit: Payer: BLUE CROSS/BLUE SHIELD

## 2018-08-24 ENCOUNTER — Ambulatory Visit: Admit: 2018-08-24 | Discharge: 2018-08-25 | Payer: MEDICARE

## 2018-08-24 ENCOUNTER — Encounter: Admit: 2018-08-24 | Discharge: 2018-08-25 | Payer: MEDICARE

## 2018-08-24 DIAGNOSIS — C220 Liver cell carcinoma: Principal | ICD-10-CM

## 2018-08-24 DIAGNOSIS — K7460 Unspecified cirrhosis of liver: Principal | ICD-10-CM

## 2018-08-24 DIAGNOSIS — Z79899 Other long term (current) drug therapy: Secondary | ICD-10-CM | POA: Diagnosis not present

## 2018-08-24 DIAGNOSIS — R161 Splenomegaly, not elsewhere classified: Secondary | ICD-10-CM | POA: Diagnosis not present

## 2018-08-24 DIAGNOSIS — I350 Nonrheumatic aortic (valve) stenosis: Secondary | ICD-10-CM | POA: Diagnosis not present

## 2018-08-24 DIAGNOSIS — E119 Type 2 diabetes mellitus without complications: Secondary | ICD-10-CM | POA: Diagnosis not present

## 2018-08-24 DIAGNOSIS — M810 Age-related osteoporosis without current pathological fracture: Secondary | ICD-10-CM | POA: Diagnosis not present

## 2018-08-24 DIAGNOSIS — Z7951 Long term (current) use of inhaled steroids: Secondary | ICD-10-CM | POA: Diagnosis not present

## 2018-08-24 DIAGNOSIS — E46 Unspecified protein-calorie malnutrition: Secondary | ICD-10-CM | POA: Diagnosis not present

## 2018-08-24 DIAGNOSIS — K7469 Other cirrhosis of liver: Secondary | ICD-10-CM | POA: Diagnosis not present

## 2018-08-24 DIAGNOSIS — Z6827 Body mass index (BMI) 27.0-27.9, adult: Secondary | ICD-10-CM | POA: Diagnosis not present

## 2018-08-24 DIAGNOSIS — R188 Other ascites: Secondary | ICD-10-CM | POA: Diagnosis not present

## 2018-08-24 DIAGNOSIS — K766 Portal hypertension: Secondary | ICD-10-CM | POA: Diagnosis not present

## 2018-08-24 DIAGNOSIS — K831 Obstruction of bile duct: Secondary | ICD-10-CM | POA: Diagnosis not present

## 2018-08-24 DIAGNOSIS — K7290 Hepatic failure, unspecified without coma: Secondary | ICD-10-CM | POA: Diagnosis not present

## 2018-08-24 DIAGNOSIS — I10 Essential (primary) hypertension: Secondary | ICD-10-CM | POA: Diagnosis not present

## 2018-08-29 NOTE — Unmapped (Signed)
[facility] VIR Biopsy Request Information Sheet     Referring Provider:  Dr. Pervis Hocking  Date of Review:  08/29/2018    Reviewing Provider:  Levert Feinstein, MD     Requested Biopsy Site:  Right hepatic lobe    Reason for Request:  Transplant candidacy    Past Medical History:    Past Medical History:   Diagnosis Date   ??? Aortic valve stenosis    ??? Autoimmune cholangitis    ??? 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 Cholestatic cirrhosis (CMS-HCC)    ??? Hepatic encephalopathy (CMS-HCC)    ??? Hypertension    ??? Sleep apnea    ??? Type 2 diabetes mellitus (CMS-HCC)        Imaging reviewed:  PET/CT dated 07/26/2018 and MRI abdomen dated 07/12/2018    Recommended Imaging Modality to Perform Biopsy:  CT and ultrasound    Comments for Biopsy:      Patient will first need a paracentesis to make this biopsy safe.  These lesions are small and may not be well visualized by CT or ultrasound but currently are impacting his transplant candidacy.  Additionally, there is concern that this may represent an infiltrative HCC.  Recommend giving consideration to IV contrast prior to biopsy during prescan.  Additionally, recommend having pathology on site for biopsy.  There is a high rate that this biopsy will not yield diagnostic tissue.  This was discussed directly with Dr. Pervis Hocking.

## 2018-09-07 ENCOUNTER — Encounter: Admit: 2018-09-07 | Discharge: 2018-09-07 | Payer: MEDICARE

## 2018-09-07 DIAGNOSIS — K7290 Hepatic failure, unspecified without coma: Principal | ICD-10-CM

## 2018-09-07 DIAGNOSIS — E119 Type 2 diabetes mellitus without complications: Secondary | ICD-10-CM | POA: Diagnosis not present

## 2018-09-07 DIAGNOSIS — K7460 Unspecified cirrhosis of liver: Secondary | ICD-10-CM | POA: Diagnosis not present

## 2018-09-07 DIAGNOSIS — R188 Other ascites: Secondary | ICD-10-CM | POA: Diagnosis not present

## 2018-09-07 DIAGNOSIS — I10 Essential (primary) hypertension: Secondary | ICD-10-CM | POA: Diagnosis not present

## 2018-09-07 DIAGNOSIS — K769 Liver disease, unspecified: Secondary | ICD-10-CM | POA: Diagnosis not present

## 2018-09-07 DIAGNOSIS — K7469 Other cirrhosis of liver: Secondary | ICD-10-CM | POA: Diagnosis not present

## 2018-09-08 ENCOUNTER — Ambulatory Visit (HOSPITAL_COMMUNITY)
Admission: RE | Admit: 2018-09-08 | Discharge: 2018-09-08 | Disposition: A | Discharge status: Home / Self Care | Payer: BC Managed Care – PPO | Source: Ambulatory Visit | Attending: Transplant Hepatology | Admitting: Transplant Hepatology

## 2018-09-08 ENCOUNTER — Other Ambulatory Visit: Payer: Self-pay

## 2018-09-08 ENCOUNTER — Other Ambulatory Visit (HOSPITAL_COMMUNITY): Payer: Self-pay | Admitting: Transplant Hepatology

## 2018-09-08 DIAGNOSIS — K7469 Other cirrhosis of liver: Secondary | ICD-10-CM

## 2018-09-08 DIAGNOSIS — J90 Pleural effusion, not elsewhere classified: Secondary | ICD-10-CM | POA: Diagnosis not present

## 2018-09-08 DIAGNOSIS — J9811 Atelectasis: Secondary | ICD-10-CM | POA: Diagnosis not present

## 2018-09-09 ENCOUNTER — Ambulatory Visit: Admit: 2018-09-09 | Discharge: 2018-09-15 | Disposition: A | Discharge status: Home / Self Care | Payer: MEDICARE

## 2018-09-09 DIAGNOSIS — J90 Pleural effusion, not elsewhere classified: Principal | ICD-10-CM

## 2018-09-09 DIAGNOSIS — R918 Other nonspecific abnormal finding of lung field: Secondary | ICD-10-CM | POA: Diagnosis not present

## 2018-09-09 DIAGNOSIS — J95811 Postprocedural pneumothorax: Secondary | ICD-10-CM | POA: Diagnosis not present

## 2018-09-09 DIAGNOSIS — Z48813 Encounter for surgical aftercare following surgery on the respiratory system: Secondary | ICD-10-CM | POA: Diagnosis not present

## 2018-09-09 DIAGNOSIS — Z20828 Contact with and (suspected) exposure to other viral communicable diseases: Secondary | ICD-10-CM | POA: Diagnosis not present

## 2018-09-09 DIAGNOSIS — R509 Fever, unspecified: Secondary | ICD-10-CM | POA: Diagnosis not present

## 2018-09-09 DIAGNOSIS — I10 Essential (primary) hypertension: Secondary | ICD-10-CM | POA: Diagnosis not present

## 2018-09-09 DIAGNOSIS — Z801 Family history of malignant neoplasm of trachea, bronchus and lung: Secondary | ICD-10-CM | POA: Diagnosis not present

## 2018-09-09 DIAGNOSIS — K7469 Other cirrhosis of liver: Secondary | ICD-10-CM | POA: Diagnosis not present

## 2018-09-09 DIAGNOSIS — J939 Pneumothorax, unspecified: Secondary | ICD-10-CM | POA: Diagnosis not present

## 2018-09-09 DIAGNOSIS — J9811 Atelectasis: Secondary | ICD-10-CM | POA: Diagnosis not present

## 2018-09-09 DIAGNOSIS — R188 Other ascites: Secondary | ICD-10-CM | POA: Diagnosis not present

## 2018-09-09 DIAGNOSIS — J9589 Other postprocedural complications and disorders of respiratory system, not elsewhere classified: Secondary | ICD-10-CM | POA: Diagnosis not present

## 2018-09-09 DIAGNOSIS — Z9889 Other specified postprocedural states: Secondary | ICD-10-CM | POA: Diagnosis not present

## 2018-09-09 DIAGNOSIS — K8020 Calculus of gallbladder without cholecystitis without obstruction: Secondary | ICD-10-CM | POA: Diagnosis not present

## 2018-09-09 DIAGNOSIS — D72829 Elevated white blood cell count, unspecified: Secondary | ICD-10-CM | POA: Diagnosis not present

## 2018-09-09 DIAGNOSIS — Z800 Family history of malignant neoplasm of digestive organs: Secondary | ICD-10-CM | POA: Diagnosis not present

## 2018-09-09 DIAGNOSIS — E119 Type 2 diabetes mellitus without complications: Secondary | ICD-10-CM | POA: Diagnosis not present

## 2018-09-09 DIAGNOSIS — R9389 Abnormal findings on diagnostic imaging of other specified body structures: Secondary | ICD-10-CM | POA: Diagnosis not present

## 2018-09-09 DIAGNOSIS — R05 Cough: Secondary | ICD-10-CM | POA: Diagnosis not present

## 2018-09-09 DIAGNOSIS — J9819 Other pulmonary collapse: Secondary | ICD-10-CM | POA: Diagnosis not present

## 2018-09-09 DIAGNOSIS — J986 Disorders of diaphragm: Secondary | ICD-10-CM | POA: Diagnosis not present

## 2018-09-09 DIAGNOSIS — K7689 Other specified diseases of liver: Secondary | ICD-10-CM | POA: Diagnosis not present

## 2018-09-09 DIAGNOSIS — E039 Hypothyroidism, unspecified: Secondary | ICD-10-CM | POA: Diagnosis not present

## 2018-09-09 DIAGNOSIS — J9383 Other pneumothorax: Secondary | ICD-10-CM | POA: Diagnosis not present

## 2018-09-09 DIAGNOSIS — R161 Splenomegaly, not elsewhere classified: Secondary | ICD-10-CM | POA: Diagnosis not present

## 2018-09-09 DIAGNOSIS — R0602 Shortness of breath: Secondary | ICD-10-CM | POA: Diagnosis not present

## 2018-09-09 DIAGNOSIS — K7460 Unspecified cirrhosis of liver: Secondary | ICD-10-CM | POA: Diagnosis not present

## 2018-09-09 DIAGNOSIS — K7290 Hepatic failure, unspecified without coma: Secondary | ICD-10-CM | POA: Diagnosis not present

## 2018-09-09 DIAGNOSIS — Z794 Long term (current) use of insulin: Secondary | ICD-10-CM | POA: Diagnosis not present

## 2018-09-09 DIAGNOSIS — Z4682 Encounter for fitting and adjustment of non-vascular catheter: Secondary | ICD-10-CM | POA: Diagnosis not present

## 2018-09-10 DIAGNOSIS — J90 Pleural effusion, not elsewhere classified: Principal | ICD-10-CM

## 2018-09-12 DIAGNOSIS — J90 Pleural effusion, not elsewhere classified: Principal | ICD-10-CM

## 2018-09-13 DIAGNOSIS — J90 Pleural effusion, not elsewhere classified: Principal | ICD-10-CM

## 2018-09-14 DIAGNOSIS — J90 Pleural effusion, not elsewhere classified: Principal | ICD-10-CM

## 2018-09-14 MED ORDER — BACLOFEN 5 MG TABLET
ORAL_TABLET | Freq: Three times a day (TID) | ORAL | 0 refills | 30.00000 days | Status: SS | PRN
Start: 2018-09-14 — End: 2018-09-18

## 2018-09-14 MED ORDER — OXYCODONE 5 MG TABLET
ORAL_TABLET | Freq: Four times a day (QID) | ORAL | 0 refills | 2.00000 days | Status: SS | PRN
Start: 2018-09-14 — End: 2018-09-22

## 2018-09-15 ENCOUNTER — Ambulatory Visit: Admit: 2018-09-15 | Discharge: 2018-09-23 | Disposition: A | Discharge status: Home / Self Care | Payer: MEDICARE

## 2018-09-15 ENCOUNTER — Encounter: Admit: 2018-09-15 | Discharge: 2018-09-15 | Payer: MEDICARE

## 2018-09-15 ENCOUNTER — Encounter: Admit: 2018-09-15 | Discharge: 2018-09-23 | Disposition: A | Discharge status: Home / Self Care | Payer: MEDICARE

## 2018-09-15 DIAGNOSIS — K7460 Unspecified cirrhosis of liver: Principal | ICD-10-CM

## 2018-09-15 DIAGNOSIS — E872 Acidosis: Secondary | ICD-10-CM | POA: Diagnosis not present

## 2018-09-15 DIAGNOSIS — E039 Hypothyroidism, unspecified: Secondary | ICD-10-CM | POA: Diagnosis not present

## 2018-09-15 DIAGNOSIS — J90 Pleural effusion, not elsewhere classified: Secondary | ICD-10-CM | POA: Diagnosis not present

## 2018-09-15 DIAGNOSIS — G4733 Obstructive sleep apnea (adult) (pediatric): Secondary | ICD-10-CM | POA: Diagnosis not present

## 2018-09-15 DIAGNOSIS — I350 Nonrheumatic aortic (valve) stenosis: Secondary | ICD-10-CM | POA: Diagnosis not present

## 2018-09-15 DIAGNOSIS — D689 Coagulation defect, unspecified: Secondary | ICD-10-CM | POA: Diagnosis not present

## 2018-09-15 DIAGNOSIS — D899 Disorder involving the immune mechanism, unspecified: Secondary | ICD-10-CM | POA: Diagnosis not present

## 2018-09-15 DIAGNOSIS — R579 Shock, unspecified: Secondary | ICD-10-CM | POA: Diagnosis not present

## 2018-09-15 DIAGNOSIS — R739 Hyperglycemia, unspecified: Secondary | ICD-10-CM | POA: Diagnosis not present

## 2018-09-15 DIAGNOSIS — Z944 Liver transplant status: Secondary | ICD-10-CM | POA: Diagnosis not present

## 2018-09-15 DIAGNOSIS — Z4823 Encounter for aftercare following liver transplant: Secondary | ICD-10-CM | POA: Diagnosis not present

## 2018-09-15 DIAGNOSIS — Z4802 Encounter for removal of sutures: Secondary | ICD-10-CM | POA: Diagnosis not present

## 2018-09-15 DIAGNOSIS — R251 Tremor, unspecified: Secondary | ICD-10-CM | POA: Diagnosis not present

## 2018-09-15 DIAGNOSIS — E612 Magnesium deficiency: Secondary | ICD-10-CM | POA: Diagnosis not present

## 2018-09-15 DIAGNOSIS — Z5181 Encounter for therapeutic drug level monitoring: Secondary | ICD-10-CM | POA: Diagnosis not present

## 2018-09-15 DIAGNOSIS — Z79899 Other long term (current) drug therapy: Secondary | ICD-10-CM | POA: Diagnosis not present

## 2018-09-15 DIAGNOSIS — Z800 Family history of malignant neoplasm of digestive organs: Secondary | ICD-10-CM | POA: Diagnosis not present

## 2018-09-15 DIAGNOSIS — I959 Hypotension, unspecified: Secondary | ICD-10-CM | POA: Diagnosis not present

## 2018-09-15 DIAGNOSIS — I10 Essential (primary) hypertension: Secondary | ICD-10-CM | POA: Diagnosis not present

## 2018-09-15 DIAGNOSIS — C220 Liver cell carcinoma: Secondary | ICD-10-CM | POA: Diagnosis not present

## 2018-09-15 DIAGNOSIS — K7290 Hepatic failure, unspecified without coma: Secondary | ICD-10-CM | POA: Diagnosis not present

## 2018-09-15 DIAGNOSIS — K8309 Other cholangitis: Secondary | ICD-10-CM | POA: Diagnosis not present

## 2018-09-15 DIAGNOSIS — J189 Pneumonia, unspecified organism: Secondary | ICD-10-CM | POA: Diagnosis not present

## 2018-09-15 DIAGNOSIS — E119 Type 2 diabetes mellitus without complications: Secondary | ICD-10-CM | POA: Diagnosis not present

## 2018-09-15 DIAGNOSIS — K7469 Other cirrhosis of liver: Secondary | ICD-10-CM | POA: Diagnosis not present

## 2018-09-15 DIAGNOSIS — Z20828 Contact with and (suspected) exposure to other viral communicable diseases: Secondary | ICD-10-CM | POA: Diagnosis not present

## 2018-09-15 DIAGNOSIS — R51 Headache: Secondary | ICD-10-CM | POA: Diagnosis not present

## 2018-09-15 DIAGNOSIS — R918 Other nonspecific abnormal finding of lung field: Secondary | ICD-10-CM | POA: Diagnosis not present

## 2018-09-15 DIAGNOSIS — R0602 Shortness of breath: Secondary | ICD-10-CM | POA: Diagnosis not present

## 2018-09-15 DIAGNOSIS — K567 Ileus, unspecified: Secondary | ICD-10-CM | POA: Diagnosis not present

## 2018-09-15 DIAGNOSIS — R509 Fever, unspecified: Secondary | ICD-10-CM | POA: Diagnosis not present

## 2018-09-15 DIAGNOSIS — R5383 Other fatigue: Secondary | ICD-10-CM | POA: Diagnosis not present

## 2018-09-15 DIAGNOSIS — D62 Acute posthemorrhagic anemia: Secondary | ICD-10-CM | POA: Diagnosis not present

## 2018-09-15 DIAGNOSIS — E8770 Fluid overload, unspecified: Secondary | ICD-10-CM | POA: Diagnosis not present

## 2018-09-15 MED ORDER — GENERIC EXTERNAL MEDICATION
Status: DC
Start: ? — End: 2018-09-15

## 2018-09-15 MED ORDER — GENERIC EXTERNAL MEDICATION
60.00 | Status: DC
Start: 2018-09-15 — End: 2018-09-15

## 2018-09-15 MED ORDER — DEXTROSE 50 % IV SOLN
12.50 | INTRAVENOUS | Status: DC
Start: ? — End: 2018-09-15

## 2018-09-15 MED ORDER — GENERIC EXTERNAL MEDICATION
Status: DC
Start: 2018-09-15 — End: 2018-09-15

## 2018-09-15 MED ORDER — GENERIC EXTERNAL MEDICATION
500.00 | Status: DC
Start: 2018-09-15 — End: 2018-09-15

## 2018-09-15 MED ORDER — SPIRONOLACTONE 100 MG PO TABS
100.00 | ORAL_TABLET | ORAL | Status: DC
Start: 2018-09-15 — End: 2018-09-15

## 2018-09-15 MED ORDER — LACTULOSE 10 GM/15ML PO SOLN
10.00 | ORAL | Status: DC
Start: 2018-09-15 — End: 2018-09-15

## 2018-09-15 MED ORDER — SERTRALINE HCL 25 MG PO TABS
25.00 | ORAL_TABLET | ORAL | Status: DC
Start: 2018-09-15 — End: 2018-09-15

## 2018-09-15 MED ORDER — BISACODYL 10 MG RE SUPP
10.00 | RECTAL | Status: DC
Start: ? — End: 2018-09-15

## 2018-09-15 MED ORDER — INSULIN LISPRO 100 UNIT/ML ~~LOC~~ SOLN
0.00 | SUBCUTANEOUS | Status: DC
Start: 2018-09-15 — End: 2018-09-15

## 2018-09-15 MED ORDER — BACLOFEN 10 MG PO TABS
5.00 | ORAL_TABLET | ORAL | Status: DC
Start: ? — End: 2018-09-15

## 2018-09-15 MED ORDER — HEPARIN SODIUM (PORCINE) 5000 UNIT/ML IJ SOLN
5000.00 | INTRAMUSCULAR | Status: DC
Start: 2018-09-14 — End: 2018-09-15

## 2018-09-15 MED ORDER — GENERIC EXTERNAL MEDICATION
1.00 | Status: DC
Start: 2018-09-15 — End: 2018-09-15

## 2018-09-15 MED ORDER — RIFAXIMIN 550 MG PO TABS
550.00 | ORAL_TABLET | ORAL | Status: DC
Start: 2018-09-15 — End: 2018-09-15

## 2018-09-15 MED ORDER — GENERIC EXTERNAL MEDICATION
137.00 | Status: DC
Start: 2018-09-15 — End: 2018-09-15

## 2018-09-15 MED ORDER — POLYETHYLENE GLYCOL 3350 17 G PO PACK
17.00 | PACK | ORAL | Status: DC
Start: 2018-09-15 — End: 2018-09-15

## 2018-09-16 DIAGNOSIS — K7460 Unspecified cirrhosis of liver: Principal | ICD-10-CM

## 2018-09-16 DIAGNOSIS — Z944 Liver transplant status: Principal | ICD-10-CM

## 2018-09-17 DIAGNOSIS — K7460 Unspecified cirrhosis of liver: Principal | ICD-10-CM

## 2018-09-18 ENCOUNTER — Encounter: Payer: Self-pay | Admitting: Family Medicine

## 2018-09-18 DIAGNOSIS — Z944 Liver transplant status: Secondary | ICD-10-CM | POA: Insufficient documentation

## 2018-09-20 NOTE — Unmapped (Signed)
Transplant Surgery Progress Note    Hospital Day: 6    Assessment:     Anthony Alvarado is a 58 yo male with PMH of aortic valve stenosis, autoimmune cholangitis, carrier of hemochromatosis HFE gene mutation, cholestatic cirrhosis, hepatic encephalopathy, hypertension, sleep apnea, and type 2 diabetes. Now s/p OLT on 09/16/18 for management of end-stage liver disease complicated by hepatocellular carcinoma.    Subjective/Interval Events:     Tolerating diet well. Plan to advance. Making brisk urine. Drain output remains high. Several diarrheal bowel movements - C diff negative. Transfer for floor.    Plan:     - PRN Oxy, dPCA  - Albumin 25% 25g q8h scheduled  - JP right lateral behind liver  - JP right medial at anastomosis  - Reg, ML  - C diff negative 7/28  - Continue transplant meds: prednisone, mycophenolate, Tacrolimus     - Transfer to the Floor w tele    Objective:      Vital Signs:  BP 114/73  - Pulse 56  - Temp 36.6 ??C (Oral)  - Resp 15  - Ht 172 cm (5' 7.72)  - Wt 83 kg (182 lb 15.7 oz)  - SpO2 100%  - BMI 28.06 kg/m??     Physical Exam:    General: Older male, jaundiced, resting comfortably in no acute distress  Cardiac: Sinus bradycardia   Pulmonary: Non labored breathing, stable on 3L   Abdomen: Soft, appropriately tender, incision c/d/I, JP drains x2 w/ SS output   Extremities: Warm and well perfused  Neuro: Alert and oriented x3    Luther Redo, MD  General Surgery, PGY1

## 2018-09-21 LAB — HLA FLOW CROSSMATCH RECIPIENT
B CHANNEL SHIFT1: -7
B CHANNEL SHIFT2: 12
FLOW B CELL #1: NEGATIVE
FLOW B CELL #2: NEGATIVE
FLOW T CELL #1: NEGATIVE
T CHANNEL SHIFT1: 9
T CHANNEL SHIFT2: 28

## 2018-09-21 LAB — COMPREHENSIVE METABOLIC PANEL
ALBUMIN: 3.5 g/dL (ref 3.5–5.0)
ALKALINE PHOSPHATASE: 53 U/L (ref 38–126)
ALT (SGPT): 100 U/L — ABNORMAL HIGH (ref <50)
ANION GAP: 8 mmol/L (ref 7–15)
AST (SGOT): 36 U/L (ref 19–55)
BILIRUBIN TOTAL: 1.8 mg/dL — ABNORMAL HIGH (ref 0.0–1.2)
BLOOD UREA NITROGEN: 26 mg/dL — ABNORMAL HIGH (ref 7–21)
BUN / CREAT RATIO: 36
CALCIUM: 8.4 mg/dL — ABNORMAL LOW (ref 8.5–10.2)
CHLORIDE: 104 mmol/L (ref 98–107)
CO2: 22 mmol/L (ref 22.0–30.0)
CREATININE: 0.72 mg/dL (ref 0.70–1.30)
EGFR CKD-EPI AA MALE: >90 mL/min/{1.73_m2} (ref >=60)
GLUCOSE RANDOM: 124 mg/dL (ref 70–179)
POTASSIUM: 4.3 mmol/L (ref 3.5–5.0)
PROTEIN TOTAL: 5.6 g/dL — ABNORMAL LOW (ref 6.5–8.3)
SODIUM: 134 mmol/L — ABNORMAL LOW (ref 135–145)

## 2018-09-21 LAB — CBC
HEMATOCRIT: 31.4 % — ABNORMAL LOW (ref 41.0–53.0)
HEMOGLOBIN: 10.7 g/dL — ABNORMAL LOW (ref 13.5–17.5)
MEAN CORPUSCULAR HEMOGLOBIN CONC: 34.1 g/dL (ref 31.0–37.0)
MEAN CORPUSCULAR HEMOGLOBIN: 32.6 pg (ref 26.0–34.0)
MEAN PLATELET VOLUME: 7 fL (ref 7.0–10.0)
PLATELET COUNT: 167 10*9/L (ref 150–440)
RED BLOOD CELL COUNT: 3.28 10*12/L — ABNORMAL LOW (ref 4.50–5.90)
RED CELL DISTRIBUTION WIDTH: 18.3 % — ABNORMAL HIGH (ref 12.0–15.0)
WBC ADJUSTED: 15.9 10*9/L — ABNORMAL HIGH (ref 4.5–11.0)

## 2018-09-21 LAB — AMYLASE: Chemistry studies:Cmplx:-:^Patient:Set:: 41

## 2018-09-21 LAB — GAMMA GLUTAMYL TRANSFERASE: Gamma glutamyl transferase:CCnc:Pt:Ser/Plas:Qn:: 137 — ABNORMAL HIGH

## 2018-09-21 LAB — BLOOD UREA NITROGEN: Urea nitrogen:MCnc:Pt:Ser/Plas:Qn:: 26 — ABNORMAL HIGH

## 2018-09-21 LAB — T CHANNEL SHIFT1: Lab: 9

## 2018-09-21 LAB — PHOSPHORUS: Phosphate:MCnc:Pt:Ser/Plas:Qn:: 2.8 — ABNORMAL LOW

## 2018-09-21 LAB — HLA C2 AB SCR: Lab: POSITIVE

## 2018-09-21 LAB — TACROLIMUS, TROUGH: Lab: 4.6 — ABNORMAL LOW

## 2018-09-21 LAB — INR: Lab: 1.26

## 2018-09-21 LAB — RED CELL DISTRIBUTION WIDTH: Lab: 18.3 — ABNORMAL HIGH

## 2018-09-21 LAB — MAGNESIUM: Magnesium:MCnc:Pt:Ser/Plas:Qn:: 2.2

## 2018-09-21 MED ORDER — TACROLIMUS 1 MG CAPSULE
ORAL_CAPSULE | Freq: Two times a day (BID) | ORAL | 11 refills | 30 days | Status: CP
Start: 2018-09-21 — End: 2018-09-23
  Filled 2018-09-22: qty 300, 30d supply, fill #0

## 2018-09-21 MED ORDER — MYCOPHENOLATE SODIUM 180 MG TABLET,DELAYED RELEASE
ORAL_TABLET | Freq: Two times a day (BID) | ORAL | 11 refills | 30 days | Status: CP
Start: 2018-09-21 — End: 2019-09-21
  Filled 2018-09-22: qty 120, 30d supply, fill #0

## 2018-09-21 NOTE — Unmapped (Signed)
Spoke with patient and caregiver, Tammy to review transplant education booklet. One hour was spent reviewing material and answering questions.   Learning Readiness: patient and Tammy acceptance and eager  Method of Instruction: Written instruction - handouts and Verbal instruction.    Topics reviewed include: How to contact the [facility] for Transplant Care, Signs and symptoms of infection and rejection to notify your coordinator, Medications - importance of taking medications as ordered and introduction to [employer] and immunosuppression, Lab work - frequency, holding immunosuppression prior to blood draw, and importance in monitoring for rejection, Wound care - daily assessment of the surgical wounds for infection; keep wounds clean and dry, Clinic appointments and health maintenance screenings, Keeping a daily log for 6 weeks (or as directed by your coordinator), Avoiding infections - Handwashing and no sick contacts, no gardening for the first 3 months, limit diaper changing, discussion about pets, Activity & lifestyles changes; use sun screen to prevent skin cancer, no smoking/ drinking, driving and lifting, Return to sexual activity and STI's, Dietary restrictions including no grapefruit or grapefruit juice, eating cold foods cold and hot foods hot (the 2 hr rule), no leftovers older than 3 days, and restuarant guidelines or Transplant - the gift of life    The patient and Caregiver, Tammy asked appropriate questions and verbalized understanding of the material covered. Outcome: verbalized understanding and reinforcement needed for safe food handling and medication administration    Patient received discharge bag including Emilio Math, RN's business card, a water bottle, face masks, urinal, daily logs, Transplant Team contact sheet, and [employer] pin and [facility] for Transplant Care pen.  Caryl Ada 09/21/2018 4:15 PM

## 2018-09-21 NOTE — Unmapped (Signed)
Patient transferred from ISCU to floor. Patient was oriented to the unit, shown how to use the call bell, and falls contract was signed.   VSS, afebrile. Patient's baseline HR is in the 50s-60s. Monitored by telemetry.  Patient ambulates safely with stand by assist.  No complaints of nausea, vomiting, or diarrhea. Patient had 1 bowel movement this shift. Urine output is adequate.  Tolerating a regular diet. ACHS accuchecks.   Mercedes incision closed with staples and open to air.  2 JP drains on right side set to gravity drainage. Drains are putting out frank red blood. SRF aware. Drain sites covered with 4x4 split gauze and abd pad.  Patient using CPAP at night that was brought from home.  Pain controlled well with dilaudid PCA and prn oxycodone.  Patient remains free of falls and injuries. Bed is low, locked, and call bell is within reach at all times. Will continue to monitor.     Problem: Adult Inpatient Plan of Care  Goal: Plan of Care Review  Outcome: Progressing  Goal: Patient-Specific Goal (Individualization)  Outcome: Progressing  Goal: Absence of Hospital-Acquired Illness or Injury  Outcome: Progressing  Goal: Optimal Comfort and Wellbeing  Outcome: Progressing  Goal: Readiness for Transition of Care  Outcome: Progressing  Goal: Rounds/Family Conference  Outcome: Progressing     Problem: Wound  Goal: Optimal Wound Healing  Outcome: Progressing     Problem: Skin Injury Risk Increased  Goal: Skin Health and Integrity  Outcome: Progressing     Problem: Fall Injury Risk  Goal: Absence of Fall and Fall-Related Injury  Outcome: Progressing     Problem: Self-Care Deficit  Goal: Improved Ability to Complete Activities of Daily Living  Outcome: Progressing     Problem: Diabetes Comorbidity  Goal: Blood Glucose Level Within Desired Range  Outcome: Progressing     Problem: Hypertension Comorbidity  Goal: Blood Pressure in Desired Range  Outcome: Progressing     Problem: Obstructive Sleep Apnea Risk or Actual (Comorbidity Management)  Goal: Unobstructed Breathing During Sleep  Outcome: Progressing     Problem: Pain Chronic (Persistent) (Comorbidity Management)  Goal: Acceptable Pain Control and Functional Ability  Outcome: Progressing     Problem: Infection  Goal: Infection Symptom Resolution  Outcome: Progressing

## 2018-09-21 NOTE — Unmapped (Signed)
Tacrolimus Therapeutic Monitoring Pharmacy Note    Georgiann Hahn is a 58 y.o. male starting tacrolimus.     Indication: Liver transplant     Date of Transplant: 09/16/18      Prior Dosing Information: Current regimen 4 mg BID      Goals:  Therapeutic Drug Levels  Tacrolimus trough goal: 8-10 ng/mL    Additional Clinical Monitoring/Outcomes  ?? Monitor renal function (SCr and urine output) and liver function (LFTs)  ?? Monitor for signs/symptoms of adverse events (e.g., hyperglycemia, hyperkalemia, hypomagnesemia, hypertension, headache, tremor)    Results:   Tacrolimus level: 4.6 ng/mL, drawn appropriately    Pharmacokinetic Considerations and Significant Drug Interactions:  ? Concurrent hepatotoxic medications: None identified  ? Concurrent CYP3A4 substrates/inhibitors: None identified  ? Concurrent nephrotoxic medications: Bactrim    Assessment/Plan:  Recommendedation(s)  ? Increase to 5 mg BID    Follow-up  ? Daily tac levels in AM.   ? A pharmacist will continue to monitor and recommend levels as appropriate    Please page service pharmacist with questions/clarifications.    Vertis Kelch, PharmD

## 2018-09-21 NOTE — Unmapped (Addendum)
Mr. Georgiann Hahn is a 58 y.o. male with history notable for autoimmune cholangitis, carrier of hemochromatosis HFE gene mutation, cholestatic cirrhosis, and ESLD. He was admitted on *** and is s/p OLT on 09/16/2018. [name] was noted to have an approximate blood loss of 5L and received 5FFP, 3pRBCs, and 2 Cryo intraoperatively. Postoperatively, he remained intubated and was transferred to the SICU for critical care management. Immunosupression induction was completed with Campath and methylprednisolone and was continued with methylprednisolone taper, tacrolimus, and mycophenolate. He was given all appropriate prophylaxis.     Hepatic U/S on POD 0 was notable for patent vasculature and appropriate perfusion. Overnight, the patient's urine output dropped and his creatine, which was 1.04 on admission, rose to 1.77. For the remainder of his stay, the patient's creatinine steadily dropped and was *** on discharge. His immediate postoperative course was also complicated by low Hgb, which was corrected by transfusions of pRBCs.     The patient was extubated in the SICU and was weaned to RA. He was transferred to ISCU on 09/19/2018, and then to the floor on 09/20/2018. On the days leading to discharge, [name] was tolerating a normal diet, his pain was well controlled on oral pain medications, and ***. He was discharged on *** to follow up with Transplant on an outpatient basis.

## 2018-09-21 NOTE — Unmapped (Signed)
[facility] Pharmacist Intervention    Type of intervention: patient enrollment    Medication: tac, mycophen    Problem: received notification patient will need these meds filled at [facility] and couriered to [facility] for discharge later this week    Intervention: reached out to triage for test claims, onboarding set up for this week pending test claims    Follow up needed: see above    Approximate time spent: 10 minutes    [name]   [facility] Specialty Pharmacist

## 2018-09-21 NOTE — Unmapped (Signed)
Transplant Surgery Progress Note    Hospital Day: 7    Assessment:     Anthony Alvarado is a 58 yo male with PMH of aortic valve stenosis, autoimmune cholangitis, carrier of hemochromatosis HFE gene mutation, cholestatic cirrhosis, hepatic encephalopathy, hypertension, sleep apnea, and type 2 diabetes. Now s/p OLT on 09/16/18 for management of end-stage liver disease complicated by hepatocellular carcinoma.    Subjective/Interval Events:     Decreased BMs. Good UOP. Tolerating regular diet. Pain well controlled.     Plan:     - PRN Oxy, dc PCA, IV Dilaudid PRN  - Reg, ML  - JP right lateral behind liver  - JP right medial at anastomosis  - C diff negative 7/28  - Continue transplant meds: prednisone, mycophenolate, Tacrolimus   - Floor w tele    Objective:      Vital Signs:  BP 128/77  - Pulse 59  - Temp 35.7 ??C (Oral)  - Resp 18  - Ht 172 cm (5' 7.72)  - Wt 79.5 kg (175 lb 3.2 oz)  - SpO2 99%  - BMI 26.86 kg/m??     Physical Exam:    General: Older male, jaundiced, resting comfortably in no acute distress  Cardiac: Sinus bradycardia   Pulmonary: Non labored breathing, stable on room air   Abdomen: Soft, appropriately tender, incision c/d/I, JP drains x2 w/ sanguinous output, thin  Extremities: Warm and well perfused  Neuro: Alert and oriented x3

## 2018-09-21 NOTE — Unmapped (Signed)
VSS. Afebrile. No calls from telemetry. Voiding adequate urine output. Pt had one BM this shift. Tolerating regular diet without report of nausea/vomitting. PCA d/c'd this shift. JP drains continue to put out bloody drainage. Pain controlled with PRN oxycodone.  Pt ambulating standby assist. No pt questions/concerns at this time. Will continue to monitor.       Problem: Adult Inpatient Plan of Care  Goal: Plan of Care Review  Outcome: Progressing  Goal: Patient-Specific Goal (Individualization)  Outcome: Progressing  Goal: Absence of Hospital-Acquired Illness or Injury  Outcome: Progressing  Goal: Optimal Comfort and Wellbeing  Outcome: Progressing  Goal: Readiness for Transition of Care  Outcome: Progressing  Goal: Rounds/Family Conference  Outcome: Progressing     Problem: Wound  Goal: Optimal Wound Healing  Outcome: Progressing     Problem: Skin Injury Risk Increased  Goal: Skin Health and Integrity  Outcome: Progressing     Problem: Fall Injury Risk  Goal: Absence of Fall and Fall-Related Injury  Outcome: Progressing     Problem: Self-Care Deficit  Goal: Improved Ability to Complete Activities of Daily Living  Outcome: Progressing     Problem: Diabetes Comorbidity  Goal: Blood Glucose Level Within Desired Range  Outcome: Progressing     Problem: Hypertension Comorbidity  Goal: Blood Pressure in Desired Range  Outcome: Progressing     Problem: Obstructive Sleep Apnea Risk or Actual (Comorbidity Management)  Goal: Unobstructed Breathing During Sleep  Outcome: Progressing     Problem: Pain Chronic (Persistent) (Comorbidity Management)  Goal: Acceptable Pain Control and Functional Ability  Outcome: Progressing     Problem: Infection  Goal: Infection Symptom Resolution  Outcome: Progressing

## 2018-09-22 LAB — ALT (SGPT): Alanine aminotransferase:CCnc:Pt:Ser/Plas:Qn:: 91 — ABNORMAL HIGH

## 2018-09-22 LAB — CBC
HEMATOCRIT: 31.7 % — ABNORMAL LOW (ref 41.0–53.0)
HEMOGLOBIN: 10.8 g/dL — ABNORMAL LOW (ref 13.5–17.5)
MEAN CORPUSCULAR HEMOGLOBIN CONC: 34 g/dL (ref 31.0–37.0)
MEAN CORPUSCULAR HEMOGLOBIN: 33 pg (ref 26.0–34.0)
MEAN CORPUSCULAR VOLUME: 97.1 fL (ref 80.0–100.0)
MEAN PLATELET VOLUME: 6.9 fL — ABNORMAL LOW (ref 7.0–10.0)
PLATELET COUNT: 189 10*9/L (ref 150–440)
WBC ADJUSTED: 18.2 10*9/L — ABNORMAL HIGH (ref 4.5–11.0)

## 2018-09-22 LAB — COMPREHENSIVE METABOLIC PANEL
ALBUMIN: 3.2 g/dL — ABNORMAL LOW (ref 3.5–5.0)
ALKALINE PHOSPHATASE: 52 U/L (ref 38–126)
ALT (SGPT): 91 U/L — ABNORMAL HIGH (ref <50)
ANION GAP: 10 mmol/L (ref 7–15)
AST (SGOT): 34 U/L (ref 19–55)
BILIRUBIN TOTAL: 1.7 mg/dL — ABNORMAL HIGH (ref 0.0–1.2)
BLOOD UREA NITROGEN: 20 mg/dL (ref 7–21)
BUN / CREAT RATIO: 31
CALCIUM: 8.2 mg/dL — ABNORMAL LOW (ref 8.5–10.2)
CO2: 21 mmol/L — ABNORMAL LOW (ref 22.0–30.0)
CREATININE: 0.64 mg/dL — ABNORMAL LOW (ref 0.70–1.30)
EGFR CKD-EPI NON-AA MALE: >90 mL/min/{1.73_m2} (ref >=60)
GLUCOSE RANDOM: 111 mg/dL (ref 70–179)
POTASSIUM: 4.5 mmol/L (ref 3.5–5.0)
PROTEIN TOTAL: 5.4 g/dL — ABNORMAL LOW (ref 6.5–8.3)
SODIUM: 138 mmol/L (ref 135–145)

## 2018-09-22 LAB — PROTIME-INR: INR: 1.2

## 2018-09-22 LAB — GAMMA GLUTAMYL TRANSFERASE: Gamma glutamyl transferase:CCnc:Pt:Ser/Plas:Qn:: 130 — ABNORMAL HIGH

## 2018-09-22 LAB — RED BLOOD CELL COUNT: Lab: 3.27 — ABNORMAL LOW

## 2018-09-22 LAB — TACROLIMUS, TROUGH: Lab: 4.4 — ABNORMAL LOW

## 2018-09-22 LAB — PHOSPHORUS: Phosphate:MCnc:Pt:Ser/Plas:Qn:: 2.9

## 2018-09-22 LAB — MAGNESIUM: Magnesium:MCnc:Pt:Ser/Plas:Qn:: 1.9

## 2018-09-22 LAB — INR: Lab: 1.2

## 2018-09-22 LAB — AMYLASE: Chemistry studies:Cmplx:-:^Patient:Set:: 38

## 2018-09-22 MED ORDER — DOCUSATE SODIUM 100 MG CAPSULE
ORAL_CAPSULE | Freq: Two times a day (BID) | ORAL | 2 refills | 15 days | Status: CP | PRN
Start: 2018-09-22 — End: 2018-10-10
  Filled 2018-09-23: qty 30, 15d supply, fill #0

## 2018-09-22 MED ORDER — ACETAMINOPHEN 325 MG TABLET
ORAL_TABLET | Freq: Four times a day (QID) | ORAL | 0 refills | 13 days | Status: CP | PRN
Start: 2018-09-22 — End: 2019-09-22
  Filled 2018-09-23: qty 100, 13d supply, fill #0

## 2018-09-22 MED ORDER — OXYCODONE 5 MG TABLET
ORAL_TABLET | Freq: Four times a day (QID) | ORAL | 0 refills | 5 days | Status: CP | PRN
Start: 2018-09-22 — End: ?
  Filled 2018-09-23: qty 40, 5d supply, fill #0

## 2018-09-22 MED ORDER — LANCETS 33 GAUGE
Freq: Three times a day (TID) | 11 refills | 0 days | Status: CP
Start: 2018-09-22 — End: ?

## 2018-09-22 MED ORDER — POLYETHYLENE GLYCOL 3350 17 GRAM/DOSE ORAL POWDER
Freq: Every day | ORAL | 0 refills | 14.00000 days | Status: CP | PRN
Start: 2018-09-22 — End: 2018-10-10
  Filled 2018-09-23: qty 238, 14d supply, fill #0

## 2018-09-22 MED ORDER — ASPIRIN 81 MG TABLET,DELAYED RELEASE
ORAL_TABLET | Freq: Every day | ORAL | 11 refills | 30 days | Status: CP
Start: 2018-09-22 — End: 2018-09-27
  Filled 2018-09-23: qty 30, 30d supply, fill #0

## 2018-09-22 MED ORDER — BLOOD SUGAR DIAGNOSTIC STRIPS
Freq: Three times a day (TID) | 11 refills | 0 days | Status: CP
Start: 2018-09-22 — End: ?

## 2018-09-22 MED ORDER — MG-PLUS-PROTEIN 133 MG TABLET
ORAL_TABLET | Freq: Two times a day (BID) | ORAL | 11 refills | 0.00000 days | Status: CP
Start: 2018-09-22 — End: ?
  Filled 2018-09-23: qty 100, 50d supply, fill #0

## 2018-09-22 MED ORDER — LEVOTHYROXINE 137 MCG TABLET
ORAL_TABLET | Freq: Every day | ORAL | 11 refills | 30.00000 days | Status: CP
Start: 2018-09-22 — End: 2018-09-24

## 2018-09-22 MED ORDER — SERTRALINE 25 MG TABLET
ORAL_TABLET | Freq: Every day | ORAL | 11 refills | 30 days | Status: CP
Start: 2018-09-22 — End: 2019-09-22

## 2018-09-22 MED FILL — MYCOPHENOLATE SODIUM 180 MG TABLET,DELAYED RELEASE: 30 days supply | Qty: 120 | Fill #0 | Status: AC

## 2018-09-22 MED FILL — TACROLIMUS 1 MG CAPSULE: 30 days supply | Qty: 300 | Fill #0 | Status: AC

## 2018-09-22 NOTE — Unmapped (Signed)
[facility]    Patient Onboarding/Medication Counseling    Anthony Alvarado is a 58 y.o. male with liver transplant who I am counseling today on continuation of therapy.  I am speaking to the patient.    Verified patient's date of birth / HIPAA.    Specialty medication(s) to be sent: Transplant: tacrolimus 1mg       Non-specialty medications/supplies to be sent: none      Medications not needed at this time: none             Prograf (tacrolimus)    Medication & Administration     Dosage: Take 5 capsules two times a day.     Administration:   ? May take with or without food  ? Take 12 hours apart    Adherence/Missed dose instructions:  ? Take a missed dose as soon as you think about it.  ? If it is close to the time for your next dose, skip the missed dose and go back to your normal time.  ? Do not take 2 doses at the same time or extra doses.    Goals of Therapy     ? To prevent organ rejection    Side Effects & Monitoring Parameters     ? Common side effects  ? Dizziness  ? Fatigue  ? Headache  ? Stuffy nose or sore throat  ? Nausea, vomiting, stomach pain, diarrhea, constipation  ? Heartburn  ? Back or joint pain  ? Increased risk of infection    ? The following side effects should be reported to the provider:  ? Allergic reaction  ? Kidney issues (change in quantity or urine passed, blood in urine, or weight gain)  ? High blood pressure (dizziness, change in eyesight, headache)  ? Electrolyte issues (change in mood, confusion, muscle pain, or weakness)  ? Abnormal breathing  ? Shakiness  ? Unexplained bleeding or bruising (gums bleeding, blood in urine, nosebleeds, any abnormal bleeding)  ? Signs of infection  ? Skin changes (sores, paleness, new or changed bumps or moles)    ? Monitoring Parameters  ? Renal function  ? Liver function  ? Glucose levels  ? Blood pressure  ? Tacrolimus trough levels  ? Cardiac monitoring (for QT prolongation)      Contraindications, Warnings, & Precautions     ? Black Box Warning: Infections - immunosuppressant agents increase the risk of infection that may lead to hospitalization or death  ? Black Box Warning: Malignancy - immunosuppressant agents may be associated with the development of malignancies that may lead to hospitalization or death  ? Limit or avoid sun and ultraviolet light exposure, use appropriate sun protection  ? Myocardial hypertrophy -avoid use in patients with congenital long QT syndrome  ? Diabetes mellitus - the risk for new-onset diabetes and insulin-dependent post-transplant diabetes mellitus is increased with tacrolimus use after transplantation  ? GI perforation  ? Hyperkalemia  ? Hypertension  ? Nephrotoxicity  ? Neurotoxicity  ? This is a narrow therapeutic index drug. Do not switch manufacturers without first talking to the provider.    Drug/Food Interactions     ? Medication list reviewed in Epic. The patient was instructed to inform the care team before taking any new medications or supplements. No drug interactions identified.   ? Avoid alcohol  ? Avoid grapefruit or grapefruit juice  ? Avoid live vaccines    Storage, Handling Precautions, & Disposal     ? Store at room  temperature  ? Keep away from children and pets      Current Medications (including OTC/herbals), Comorbidities and Allergies     No current facility-administered medications for this visit.      Current Outpatient Medications   Medication Sig Dispense Refill   ??? acetaminophen (TYLENOL) 325 MG tablet Take 2 tablets (650 mg total) by mouth every six (6) hours as needed for pain or fever (> 38C or 100.3F). 100 tablet 0   ??? aspirin (ECOTRIN) 81 MG tablet Take 1 tablet (81 mg total) by mouth daily. HOLD until directed to start in clinic. 30 tablet 11   ??? blood sugar diagnostic Strp Use as directed Three (3) times a day before meals. 90 each 11   ??? docusate sodium (COLACE) 100 MG capsule Take 1 capsule (100 mg total) by mouth two (2) times a day as needed for constipation. 30 capsule 2   ??? lancets 33 gauge Misc 1 each by Miscellaneous route Three (3) times a day before meals. 100 each 11   ??? levothyroxine (SYNTHROID) 137 MCG tablet Take 1 tablet (137 mcg total) by mouth daily. 30 tablet 11   ??? magnesium oxide-Mg AA chelate (MAGNESIUM, AMINO ACID CHELATE,) 133 mg Tab Take 1 tablet by mouth Two (2) times a day. HOLD until directed to start by your coordinator. 60 tablet 11   ??? mycophenolate (MYFORTIC) 180 MG EC tablet Take 2 tablets (360 mg total) by mouth Two (2) times a day. 120 tablet 11   ??? oxyCODONE (ROXICODONE) 5 MG immediate release tablet Take 1-2 tablets (5-10 mg total) by mouth every six (6) hours as needed for pain. 40 tablet 0   ??? [START ON 09/23/2018] pantoprazole (PROTONIX) 40 MG tablet Take 1 tablet (40 mg total) by mouth daily. 30 tablet 11   ??? polyethylene glycol (MIRALAX) 17 gram/dose powder Take 1 capful (17 g ) dissolved in 4 to 8 ounces of liquid by mouth daily as needed (constipation). 238 g 0   ??? [START ON 09/25/2018] predniSONE (DELTASONE) 5 MG tablet On Saturday 8/1: Take 2 tablets (10 mg total) by mouth once. Starting Sunday 8/2: Take 1 tablet (5 mg total) by mouth once daily 32 tablet 11   ??? sertraline (ZOLOFT) 25 MG tablet Take 1 tablet (25 mg total) by mouth daily. 30 tablet 11   ??? [START ON 09/23/2018] sulfamethoxazole-trimethoprim (BACTRIM) 400-80 mg per tablet Take 1 tablet (80 mg of trimethoprim total) by mouth 3 (three) times a week. 12 tablet 5   ??? tacrolimus (PROGRAF) 1 MG capsule Take 5 capsules (5 mg total) by mouth two (2) times a day. 300 capsule 11   ??? [START ON 09/23/2018] valGANciclovir (VALCYTE) 450 mg tablet Take 1 tablet (450 mg total) by mouth daily. 30 tablet 2     Facility-Administered Medications Ordered in Other Visits   Medication Dose Route Frequency Provider Last Rate Last Dose   ??? dextrose 50 % in water (D50W) 50 % solution 12.5 g  12.5 g Intravenous Q10 Min PRN Emilio Aspen, MD       ??? diazePAM (VALIUM) 5 mg/mL injection            ??? heparin (porcine) injection 5,000 Units  5,000 Units Subcutaneous [facility] Luther Redo, MD   5,000 Units at 09/22/18 1302   ??? insulin regular (HumuLIN,NovoLIN) injection 0-12 Units  0-12 Units Subcutaneous ACHS Emilio Aspen, MD   2 Units at 09/22/18 1113   ??? lactated Ringers infusion  10 mL/hr Intravenous Continuous Emilio Aspen, MD 10 mL/hr at 09/20/18 1800 10 mL/hr at 09/20/18 1800   ??? levothyroxine (SYNTHROID) tablet 137 mcg  137 mcg Oral Daily Emilio Aspen, MD   137 mcg at 09/22/18 0612   ??? melatonin tablet 3 mg  3 mg Oral Nightly Emilio Aspen, MD   3 mg at 09/20/18 2045   ??? mycophenolate (CELLCEPT) capsule 500 mg  500 mg Oral BID Emilio Aspen, MD   500 mg at 09/22/18 1610   ??? naloxone (NARCAN) injection 0.4 mg  0.4 mg Intravenous Q5 Min PRN Emilio Aspen, MD       ??? nystatin (MYCOSTATIN) oral suspension  10 mL Oral [facility] Emilio Aspen, MD   1,000,000 Units at 09/22/18 0754   ??? oxyCODONE (ROXICODONE) immediate release tablet 10 mg  10 mg Oral Q4H PRN Emilio Aspen, MD   10 mg at 09/22/18 1117    Or   ??? oxyCODONE (ROXICODONE) immediate release tablet 5 mg  5 mg Oral Q4H PRN Emilio Aspen, MD   5 mg at 09/22/18 0615   ??? pantoprazole (PROTONIX) EC tablet 40 mg  40 mg Oral Daily Emilio Aspen, MD   40 mg at 09/22/18 0811   ??? [START ON 09/23/2018] predniSONE (DELTASONE) tablet 15 mg  15 mg Oral Daily Emilio Aspen, MD        Followed by   ??? Melene Muller ON 09/24/2018] predniSONE (DELTASONE) tablet 10 mg  10 mg Oral Daily Emilio Aspen, MD        Followed by   ??? Melene Muller ON 09/25/2018] predniSONE (DELTASONE) tablet 5 mg  5 mg Oral Daily Emilio Aspen, MD        Followed by   ??? [START ON 10/01/2018] predniSONE (DELTASONE) tablet 2.5 mg  2.5 mg Oral Daily Emilio Aspen, MD       ??? sertraline (ZOLOFT) tablet 25 mg  25 mg Oral Daily Emilio Aspen, MD   25 mg at 09/22/18 9604   ??? sulfamethoxazole-trimethoprim (BACTRIM) 400-80 mg tablet 80 mg of trimethoprim  1 tablet Oral 3x weekly Emilio Aspen, MD   80 mg of trimethoprim at 09/21/18 0806   ??? tacrolimus (PROGRAF) 6 mg combo product  6 mg Oral BID Belva Chimes [place], [place]       ??? valGANciclovir (VALCYTE) tablet 450 mg  450 mg Oral Daily Emilio Aspen, MD   450 mg at 09/22/18 5409       Allergies   Allergen Reactions   ??? Watermelon Flavor      Mouth itching       Patient Active Problem List   Diagnosis   ??? Type 2 diabetes mellitus (CMS-HCC)   ??? Hypothyroidism   ??? Liver cirrhosis (CMS-HCC)   ??? OSA (obstructive sleep apnea)   ??? Hypertension   ??? Encounter for pre-transplant evaluation for chronic liver disease   ??? Pleural effusion   ??? Pneumothorax after biopsy   ??? Liver replaced by transplant (CMS-HCC)       Reviewed and up to date in Epic.    Appropriateness of Therapy     Is medication and dose appropriate based on diagnosis? Yes    Baseline Quality of Life Assessment      How many days over the past month did your liver transplant keep you from your normal activities? Patient is still inpatient and discharging tomorrow 7/31.    Financial  Information     Medication Assistance provided: None Required    Anticipated copay of $0   reviewed with patient. Verified delivery address.    Delivery Information     Scheduled delivery date: 09/22/2018    Expected start date: 09/23/2018    Medication will be delivered via [facility] to the temporary address in [place].  This shipment will not require a signature.      Explained the services we provide at [facility] and that each month we would call to set up refills.  Stressed importance of returning phone calls so that we could ensure they receive their medications in time each month.  Informed patient that we should be setting up refills 7-10 days prior to when they will run out of medication.  A pharmacist will reach out to perform a clinical assessment periodically.  Informed patient that a welcome packet and a drug information handout will be sent.      Patient verbalized understanding of the above information as well as how to contact the pharmacy at [phone] option 4 with any questions/concerns.  The pharmacy is open Monday through Friday 8:30am-4:30pm.  A pharmacist is available 24/7 via pager to answer any clinical questions they may have.    Patient Specific Needs     ? Does the patient have any physical, cognitive, or cultural barriers? No    ? Patient prefers to have medications discussed with  Patient     ? Is the patient able to read and understand education materials at a high school level or above? Yes    ? Patient's primary language is  English     ? Is the patient high risk? Yes, patient taking a REMS drug     ? Does the patient require a Care Management Plan? No     ? Does the patient require physician intervention or other additional services (i.e. nutrition, smoking cessation, social work)? No      [name]  [facility] Specialty Pharmacist

## 2018-09-22 NOTE — Unmapped (Signed)
Spoke with pts caregiver, Tammy and informed her of POC-possibly discharge tomorrow if WBC downtrends. Pt starting cipro abx today. Tammy appreciative of call. Also confirmed with Tammy pts glucometer is the onetouch ultra as pt will need test strips.   Caryl Ada Inpatient Transplant Nurse Coordinator 09/22/2018 12:34 PM

## 2018-09-22 NOTE — Unmapped (Signed)
Tacrolimus Therapeutic Monitoring Pharmacy Note    Anthony Alvarado is a 58 y.o. male starting tacrolimus.     Indication: Liver transplant     Date of Transplant: 09/16/18      Prior Dosing Information: Current regimen 5 mg BID      Goals:  Therapeutic Drug Levels  Tacrolimus trough goal: 8-10 ng/mL    Additional Clinical Monitoring/Outcomes  ?? Monitor renal function (SCr and urine output) and liver function (LFTs)  ?? Monitor for signs/symptoms of adverse events (e.g., hyperglycemia, hyperkalemia, hypomagnesemia, hypertension, headache, tremor)    Results:   Tacrolimus level: 4.4 ng/mL, drawn appropriately    Pharmacokinetic Considerations and Significant Drug Interactions:  ? Concurrent hepatotoxic medications: None identified  ? Concurrent CYP3A4 substrates/inhibitors: None identified  ? Concurrent nephrotoxic medications: Bactrim    Assessment/Plan:  Recommendedation(s)  ? Increase to 6 mg BID    Follow-up  ? Daily tac levels in AM.   ? A pharmacist will continue to monitor and recommend levels as appropriate    Please page service pharmacist with questions/clarifications.    Ester Rink, PharmD

## 2018-09-22 NOTE — Unmapped (Signed)
Transplant Surgery Progress Note    Hospital Day: 8    Assessment:     Anthony Alvarado is a 58 yo male with PMH of aortic valve stenosis, autoimmune cholangitis, carrier of hemochromatosis HFE gene mutation, cholestatic cirrhosis, hepatic encephalopathy, hypertension, sleep apnea, and type 2 diabetes. Now s/p OLT on 09/16/18 for management of end-stage liver disease complicated by hepatocellular carcinoma.    Subjective/Interval Events:     NAEO. Tolerating regular diet. Making adequate urine. Drains to SD - will keep give blood loss postoperatively. Pain well controlled on oral meds.    Plan:     - PRN Oxy, dc IV Dilaudid PRN  - Reg, ML  - JP right lateral behind liver  - JP right medial at anastomosis  - C diff negative 7/28  - SQH  - Continue transplant meds: prednisone, mycophenolate, Tacrolimus   - Floor w tele    Objective:      Vital Signs:  BP 110/70  - Pulse 70  - Temp 35.6 ??C (Oral)  - Resp 19  - Ht 172 cm (5' 7.72)  - Wt 80.2 kg (176 lb 12.9 oz)  - SpO2 99%  - BMI 27.11 kg/m??     Physical Exam:  General: Older male, jaundiced, resting comfortably in no acute distress  Cardiac: Sinus bradycardia   Pulmonary: Non labored breathing, stable on room air   Abdomen: Soft, appropriately tender, incision c/d/I, JP drains x2 w/ sanguinous output, thin  Extremities: Warm and well perfused  Neuro: Alert and oriented x3    Luther Redo, MD  General Surgery PGY1

## 2018-09-22 NOTE — Unmapped (Addendum)
VSS. Afebrile. No calls from telemetry. Voiding adequate urine output. Pt had one BM this shift. Tolerating regular diet without report of nausea/vomitting. JP drains continue to put out bloody drainage. Pain controlled with PRN oxycodone. Pt walked with PT today. Pt ambulating independently in room. No pt questions/concerns at this time. Will continue to monitor.       Problem: Adult Inpatient Plan of Care  Goal: Plan of Care Review  Outcome: Progressing  Goal: Patient-Specific Goal (Individualization)  Outcome: Progressing  Goal: Absence of Hospital-Acquired Illness or Injury  Outcome: Progressing  Goal: Optimal Comfort and Wellbeing  Outcome: Progressing  Goal: Readiness for Transition of Care  Outcome: Progressing  Goal: Rounds/Family Conference  Outcome: Progressing     Problem: Wound  Goal: Optimal Wound Healing  Outcome: Progressing     Problem: Skin Injury Risk Increased  Goal: Skin Health and Integrity  Outcome: Progressing     Problem: Fall Injury Risk  Goal: Absence of Fall and Fall-Related Injury  Outcome: Progressing     Problem: Self-Care Deficit  Goal: Improved Ability to Complete Activities of Daily Living  Outcome: Progressing     Problem: Diabetes Comorbidity  Goal: Blood Glucose Level Within Desired Range  Outcome: Progressing     Problem: Hypertension Comorbidity  Goal: Blood Pressure in Desired Range  Outcome: Progressing     Problem: Obstructive Sleep Apnea Risk or Actual (Comorbidity Management)  Goal: Unobstructed Breathing During Sleep  Outcome: Progressing     Problem: Pain Chronic (Persistent) (Comorbidity Management)  Goal: Acceptable Pain Control and Functional Ability  Outcome: Progressing     Problem: Infection  Goal: Infection Symptom Resolution  Outcome: Progressing

## 2018-09-22 NOTE — Unmapped (Signed)
[facility]   Patient Onboarding/Medication Counseling    Anthony Alvarado is a 58 y.o. male with liver transplant who I am counseling today on continuation of therapy.  I am speaking to the patient.    Verified patient's date of birth / HIPAA.    Specialty medication(s) to be sent: Transplant:  mycophenolic acid 180mg       Non-specialty medications/supplies to be sent: none      Medications not needed at this time: none             Myfortic (mycophenolic acid)    Medication & Administration     Dosage:   ? Take two tablets two times a day.    Administration:   ? Take with or without food, although taking with food helps minimize GI side effects.  ? Swallow the pills whole, do not chew or crush    Adherence/Missed dose instructions:  ? Take a missed dose as soon as you think about it.  ? If it is less than 2 hours until your next dose, skip the missed dose and go back to your normal time.  ? Do not take 2 doses at the same time or extra doses.    Goals of Therapy     ? To prevent organ rejection     Side Effects & Monitoring Parameters     ? Common side effects  ? Back or joint pain  ? Constipation  ? Headache/dizziness  ? Not hungry  ? Stomach pain, diarrhea, constipation, gas, upset stomach, vomiting, nausea  ? Feeling tired or weak  ? Shakiness  ? Trouble sleeping  ? Increased risk of infection    ? The following side effects should be reported to the provider:  ? Allergic reaction  ? High blood sugar (confusion, feeling sleepy, more thirst, more hungry, passing urine more often, flushing, fast breathing, or breath that smells like fruit)  ? Electrolyte issues (mood changes, confusion, muscle pain or weakness, a heartbeat that does not feel normal, seizures, not hungry, or very bad upset stomach or throwing up)  ? High or low blood pressure (bad headache or dizziness, passing out, or change in eyesight)  ? Kidney issues (unable to pass urine, change in how much urine is passed, blood in the urine, or a big weight gain)  ? Skin (oozing, heat, swelling, redness, or pain), UTI and other infections   ? Chest pain or pressure  ? Abnormal heartbeat  ? Unexplained bleeding or bruising  ? Abnormal burning, numbness, or tingling  ? Muscle cramps,  ? Yellowing of skin or eyes    ? Monitoring parameters  ? Pregnancy test initially prior to treatment and 8-10 days later then as needed)  ? CBC weekly for first month then twice monthly for next 2 months, then monthly)  ? Monitor Renal and liver functions  ? Signs of organ rejection    Contraindications, Warnings, & Precautions     ? *This is a REMS drug and an FDA-approved patient medication guide will be printed with each dispensation  ? Black Box Warning: Infections   ? Black Box Warning: Lymphoproliferative disorders - risk of development of lymphoma and skin malignancy is increased  ? Black Box Warning: Use during pregnancy is associated with increased risks of first trimester pregnancy loss and congenital malformations.   ? Black Box Warning: Females of reproductive potential should use contraception during treatment and for 6 weeks after therapy is discontinued  ? CNS depression  ?  New or reactivated viral infections  ? Neutropenia  ? Male patients and/or their male partners should use effective contraception during treatment of the male patient and for at least 3 months after last dose.  ? Breastfeeding is not recommended during therapy and for 6 weeks after last dose    Drug/Food Interactions     ? Medication list reviewed in Epic. The patient was instructed to inform the care team before taking any new medications or supplements. No drug interactions identified.   ? Do not take Echinacea while on this medication  ? Check with your doctor before getting any vaccinations (live or inactivated)    Storage, Handling Precautions, & Disposal     ? Store at room temperature  ? Keep away from children and pets  ? This drug is considered hazardous and should be handled as little as possible.  Wash hands before and after touching pills. If someone else helps with medication administration, they should wear gloves.      Current Medications (including OTC/herbals), Comorbidities and Allergies     No current facility-administered medications for this visit.      Current Outpatient Medications   Medication Sig Dispense Refill   ??? acetaminophen (TYLENOL) 325 MG tablet Take 2 tablets (650 mg total) by mouth every six (6) hours as needed for pain or fever (> 38C or 100.84F). 100 tablet 0   ??? aspirin (ECOTRIN) 81 MG tablet Take 1 tablet (81 mg total) by mouth daily. HOLD until directed to start in clinic. 30 tablet 11   ??? blood sugar diagnostic Strp Use as directed Three (3) times a day before meals. 90 each 11   ??? docusate sodium (COLACE) 100 MG capsule Take 1 capsule (100 mg total) by mouth two (2) times a day as needed for constipation. 30 capsule 2   ??? lancets 33 gauge Misc 1 each by Miscellaneous route Three (3) times a day before meals. 100 each 11   ??? levothyroxine (SYNTHROID) 137 MCG tablet Take 1 tablet (137 mcg total) by mouth daily. 30 tablet 11   ??? magnesium oxide-Mg AA chelate (MAGNESIUM, AMINO ACID CHELATE,) 133 mg Tab Take 1 tablet by mouth Two (2) times a day. HOLD until directed to start by your coordinator. 60 tablet 11   ??? mycophenolate (MYFORTIC) 180 MG EC tablet Take 2 tablets (360 mg total) by mouth Two (2) times a day. 120 tablet 11   ??? oxyCODONE (ROXICODONE) 5 MG immediate release tablet Take 1-2 tablets (5-10 mg total) by mouth every six (6) hours as needed for pain. 40 tablet 0   ??? [START ON 09/23/2018] pantoprazole (PROTONIX) 40 MG tablet Take 1 tablet (40 mg total) by mouth daily. 30 tablet 11   ??? polyethylene glycol (MIRALAX) 17 gram/dose powder Take 1 capful (17 g ) dissolved in 4 to 8 ounces of liquid by mouth daily as needed (constipation). 238 g 0   ??? [START ON 09/25/2018] predniSONE (DELTASONE) 5 MG tablet On Saturday 8/1: Take 2 tablets (10 mg total) by mouth once. Starting Sunday 8/2: Take 1 tablet (5 mg total) by mouth once daily 32 tablet 11   ??? sertraline (ZOLOFT) 25 MG tablet Take 1 tablet (25 mg total) by mouth daily. 30 tablet 11   ??? [START ON 09/23/2018] sulfamethoxazole-trimethoprim (BACTRIM) 400-80 mg per tablet Take 1 tablet (80 mg of trimethoprim total) by mouth 3 (three) times a week. 12 tablet 5   ??? tacrolimus (PROGRAF) 1 MG capsule Take 5 capsules (  5 mg total) by mouth two (2) times a day. 300 capsule 11   ??? [START ON 09/23/2018] valGANciclovir (VALCYTE) 450 mg tablet Take 1 tablet (450 mg total) by mouth daily. 30 tablet 2     Facility-Administered Medications Ordered in Other Visits   Medication Dose Route Frequency Provider Last Rate Last Dose   ??? dextrose 50 % in water (D50W) 50 % solution 12.5 g  12.5 g Intravenous Q10 Min PRN Emilio Aspen, MD       ??? diazePAM (VALIUM) 5 mg/mL injection            ??? heparin (porcine) injection 5,000 Units  5,000 Units Subcutaneous [facility] Luther Redo, MD   5,000 Units at 09/22/18 1302   ??? insulin regular (HumuLIN,NovoLIN) injection 0-12 Units  0-12 Units Subcutaneous ACHS Emilio Aspen, MD   2 Units at 09/22/18 1113   ??? lactated Ringers infusion  10 mL/hr Intravenous Continuous Emilio Aspen, MD 10 mL/hr at 09/20/18 1800 10 mL/hr at 09/20/18 1800   ??? levothyroxine (SYNTHROID) tablet 137 mcg  137 mcg Oral Daily Emilio Aspen, MD   137 mcg at 09/22/18 0612   ??? melatonin tablet 3 mg  3 mg Oral Nightly Emilio Aspen, MD   3 mg at 09/20/18 2045   ??? mycophenolate (CELLCEPT) capsule 500 mg  500 mg Oral BID Emilio Aspen, MD   500 mg at 09/22/18 2956   ??? naloxone (NARCAN) injection 0.4 mg  0.4 mg Intravenous Q5 Min PRN Emilio Aspen, MD       ??? nystatin (MYCOSTATIN) oral suspension  10 mL Oral [facility] Emilio Aspen, MD   1,000,000 Units at 09/22/18 0754   ??? oxyCODONE (ROXICODONE) immediate release tablet 10 mg  10 mg Oral Q4H PRN Emilio Aspen, MD   10 mg at 09/22/18 1117    Or   ??? oxyCODONE (ROXICODONE) immediate release tablet 5 mg  5 mg Oral Q4H PRN Emilio Aspen, MD   5 mg at 09/22/18 0615   ??? pantoprazole (PROTONIX) EC tablet 40 mg  40 mg Oral Daily Emilio Aspen, MD   40 mg at 09/22/18 0811   ??? [START ON 09/23/2018] predniSONE (DELTASONE) tablet 15 mg  15 mg Oral Daily Emilio Aspen, MD        Followed by   ??? Melene Muller ON 09/24/2018] predniSONE (DELTASONE) tablet 10 mg  10 mg Oral Daily Emilio Aspen, MD        Followed by   ??? Melene Muller ON 09/25/2018] predniSONE (DELTASONE) tablet 5 mg  5 mg Oral Daily Emilio Aspen, MD        Followed by   ??? [START ON 10/01/2018] predniSONE (DELTASONE) tablet 2.5 mg  2.5 mg Oral Daily Emilio Aspen, MD       ??? sertraline (ZOLOFT) tablet 25 mg  25 mg Oral Daily Emilio Aspen, MD   25 mg at 09/22/18 2130   ??? sulfamethoxazole-trimethoprim (BACTRIM) 400-80 mg tablet 80 mg of trimethoprim  1 tablet Oral 3x weekly Emilio Aspen, MD   80 mg of trimethoprim at 09/21/18 0806   ??? tacrolimus (PROGRAF) 6 mg combo product  6 mg Oral BID Belva Chimes [place], [place]       ??? valGANciclovir (VALCYTE) tablet 450 mg  450 mg Oral Daily Emilio Aspen, MD   450 mg at 09/22/18 0810       Allergies   Allergen  Reactions   ??? Watermelon Flavor      Mouth itching       Patient Active Problem List   Diagnosis   ??? Type 2 diabetes mellitus (CMS-HCC)   ??? Hypothyroidism   ??? Liver cirrhosis (CMS-HCC)   ??? OSA (obstructive sleep apnea)   ??? Hypertension   ??? Encounter for pre-transplant evaluation for chronic liver disease   ??? Pleural effusion   ??? Pneumothorax after biopsy   ??? Liver replaced by transplant (CMS-HCC)       Reviewed and up to date in Epic.    Appropriateness of Therapy     Is medication and dose appropriate based on diagnosis? Yes    Baseline Quality of Life Assessment      How many days over the past month did your liver transplant keep you from your normal activities? Patient is still inpatient and discharging tomorrow 7/30.     Financial Information     Medication Assistance provided: None Required    Anticipated copay of $0 reviewed with patient. Verified delivery address.    Delivery Information     Scheduled delivery date: 09/22/2018    Expected start date: 09/23/2018    Medication will be delivered via [facility] to the temporary address in [place].  This shipment will not require a signature.      Explained the services we provide at [facility] and that each month we would call to set up refills.  Stressed importance of returning phone calls so that we could ensure they receive their medications in time each month.  Informed patient that we should be setting up refills 7-10 days prior to when they will run out of medication.  A pharmacist will reach out to perform a clinical assessment periodically.  Informed patient that a welcome packet and a drug information handout will be sent.      Patient verbalized understanding of the above information as well as how to contact the pharmacy at [phone] option 4 with any questions/concerns.  The pharmacy is open Monday through Friday 8:30am-4:30pm.  A pharmacist is available 24/7 via pager to answer any clinical questions they may have.    Patient Specific Needs     ? Does the patient have any physical, cognitive, or cultural barriers? No    ? Patient prefers to have medications discussed with  Patient     ? Is the patient able to read and understand education materials at a high school level or above? Yes    ? Patient's primary language is  English     ? Is the patient high risk? Yes, patient taking a REMS drug     ? Does the patient require a Care Management Plan? No     ? Does the patient require physician intervention or other additional services (i.e. nutrition, smoking cessation, social work)? No      [name]  [facility] Specialty Pharmacist

## 2018-09-22 NOTE — Unmapped (Signed)
Reviewed plan of care (empiric antibiotics and increased WBC that must decrease for discharge tomorrow) with Tammy and explained how to get to [facility] on [address]. Loma Boston, RN Inpatient Transplant Nurse Coordinator 09/22/2018 12:56 PM

## 2018-09-22 NOTE — Unmapped (Signed)
Patient presents without any significant events or changes noted on assessment.  Vital signs have been stable and within normal limits over the duration of this writer's shift.  Had to request for respiratory therapy to come to patient's room overnight to evaluate CPAP machine.  Patient verbalized that he felt that the CPAP felt funny over the previous night.  Evaluation was done by the respiratory therapist prior to the patient sleeping overnight.  Minimal complaints of pain noted, on assessment.  Patient required a couple of doses of pain medication over the duration of the day and they were effective.  A Regular diet is being tolerated well without any incidents of nausea and/or vomiting noted on assessment.  Patient drinking and voiding in adequate amounts over the duration of the day.  Last bowel movements were yesterday 09/21/18.  Patient remains independent with ambulation without any incidents of falls or unsteadiness noted on assessment.  Will continue to monitor patient for understanding of the plan of care, for the duration of the hospitalization through future assessments.                      Problem: Adult Inpatient Plan of Care  Goal: Plan of Care Review  09/22/2018 0420 by Montez Morita Teaghan Formica, RN  Outcome: Progressing  09/22/2018 0419 by Montez Morita Sybrina Laning, RN  Outcome: Progressing  Goal: Patient-Specific Goal (Individualization)  09/22/2018 0420 by Montez Morita Murle Hellstrom, RN  Outcome: Progressing  09/22/2018 0419 by Montez Morita Raijon Lindfors, RN  Outcome: Progressing  Goal: Absence of Hospital-Acquired Illness or Injury  09/22/2018 0420 by Montez Morita Velmer Woelfel, RN  Outcome: Progressing  09/22/2018 0419 by Montez Morita Mililani Murthy, RN  Outcome: Progressing  Goal: Optimal Comfort and Wellbeing  09/22/2018 0420 by Montez Morita Nathalee Smarr, RN  Outcome: Progressing  09/22/2018 0419 by Montez Morita Arianna Delsanto, RN  Outcome: Progressing  Goal: Readiness for Transition of Care  09/22/2018 0420 by Montez Morita Cornelius Marullo, RN  Outcome: Progressing  09/22/2018 0419 by Montez Morita Zahirah Cheslock, RN  Outcome: Progressing  Goal: Rounds/Family Conference  09/22/2018 0420 by Montez Morita Alyia Lacerte, RN  Outcome: Progressing  09/22/2018 0419 by Montez Morita Jahad Old, RN  Outcome: Progressing     Problem: Wound  Goal: Optimal Wound Healing  09/22/2018 0420 by Montez Morita Tiera Mensinger, RN  Outcome: Progressing  09/22/2018 0419 by Montez Morita Korissa Horsford, RN  Outcome: Progressing     Problem: Skin Injury Risk Increased  Goal: Skin Health and Integrity  09/22/2018 0420 by Montez Morita Dov Dill, RN  Outcome: Progressing  09/22/2018 0419 by Montez Morita Verlon Pischke, RN  Outcome: Progressing     Problem: Fall Injury Risk  Goal: Absence of Fall and Fall-Related Injury  09/22/2018 0420 by Montez Morita Jubilee Vivero, RN  Outcome: Progressing  09/22/2018 0419 by Montez Morita Hannah Strader, RN  Outcome: Progressing     Problem: Self-Care Deficit  Goal: Improved Ability to Complete Activities of Daily Living  09/22/2018 0420 by Montez Morita Peytan Andringa, RN  Outcome: Progressing  09/22/2018 0419 by Montez Morita Lourdes Kucharski, RN  Outcome: Progressing     Problem: Diabetes Comorbidity  Goal: Blood Glucose Level Within Desired Range  09/22/2018 0420 by Montez Morita Harjas Biggins, RN  Outcome: Progressing  09/22/2018 0419 by Montez Morita Cashus Halterman, RN  Outcome: Progressing     Problem: Hypertension Comorbidity  Goal: Blood Pressure in Desired Range  09/22/2018 0420 by Montez Morita Hamna Asa, RN  Outcome: Progressing  09/22/2018 0419 by Montez Morita Juliya Magill, RN  Outcome: Progressing     Problem: Obstructive  Sleep Apnea Risk or Actual (Comorbidity Management)  Goal: Unobstructed Breathing During Sleep  09/22/2018 0420 by Montez Morita Iveliz Garay, RN  Outcome: Progressing  09/22/2018 0419 by Montez Morita Siddhanth Denk, RN  Outcome: Progressing     Problem: Pain Chronic (Persistent) (Comorbidity Management)  Goal: Acceptable Pain Control and Functional Ability  09/22/2018 0420 by Montez Morita Sakina Briones, RN  Outcome: Progressing  09/22/2018 0419 by Montez Morita Lyann Hagstrom, RN  Outcome: Progressing     Problem: Infection  Goal: Infection Symptom Resolution  09/22/2018 0420 by Montez Morita Marya Lowden, RN  Outcome: Progressing  09/22/2018 0419 by Montez Morita Selby Foisy, RN Outcome: Progressing

## 2018-09-22 NOTE — Unmapped (Signed)
[facility] CASE REVIEW    An interdisciplinary care conference was held today and included the following team members: Johna Sheriff, MD Transplant Surgeon, Heidi Dach, MD Transplant Surgeon, Aleen Campi, MD Transplant Surgeon, [facility] Surgery Resident , Drake Leach, [place] Transplant Physician Assistant, Cranston Neighbor, RN Transplant Nurse Coordinator, Salem Senate, RN, MSN Transplant Nurse Coordinator, Newton Pigg, RN, MSN  Transplant Nurse Coordinator, Drue Flirt, RN  Transplant Nurse Coordinator (PD), Toy Care, PharmD Transplant Pharmacist, Sibyl Parr, LCSW Transplant Case Manager, Geannie Risen, LCSW Transplant Case Manager, Thomasene Mohair, LCSW Transplant Case Manager, Valentino Nose, RD Transplant Dietician, Elwyn Lade, MD Transplant Nephrologist, Pervis Hocking, MD Transplant Hepatologist and Tawni Carnes, MD Transplant Hepatology Fellow.    Per team, pt is post op day 6. Doing well, urinating, pain well controlled. D/c lateral drain today. White count bumped to 18. LFTs trending down. D/c tomorrow with [facility] PT/OT.    Discharge Plan: d/c 09/23/18 with HH PT/OT      Sibyl Parr, LCSW, CCTSW  Transplant Case Manager  [facility]

## 2018-09-23 LAB — COMPREHENSIVE METABOLIC PANEL
ALBUMIN: 3.3 g/dL — ABNORMAL LOW (ref 3.5–5.0)
ALKALINE PHOSPHATASE: 60 U/L (ref 38–126)
ANION GAP: 10 mmol/L (ref 7–15)
AST (SGOT): 31 U/L (ref 19–55)
BILIRUBIN TOTAL: 1.7 mg/dL — ABNORMAL HIGH (ref 0.0–1.2)
BLOOD UREA NITROGEN: 19 mg/dL (ref 7–21)
BUN / CREAT RATIO: 25
CALCIUM: 8.5 mg/dL (ref 8.5–10.2)
CHLORIDE: 106 mmol/L (ref 98–107)
CO2: 22 mmol/L (ref 22.0–30.0)
CREATININE: 0.75 mg/dL (ref 0.70–1.30)
EGFR CKD-EPI AA MALE: >90 mL/min/{1.73_m2} (ref >=60)
EGFR CKD-EPI NON-AA MALE: >90 mL/min/{1.73_m2} (ref >=60)
GLUCOSE RANDOM: 81 mg/dL (ref 70–179)
POTASSIUM: 4.2 mmol/L (ref 3.5–5.0)
PROTEIN TOTAL: 5.3 g/dL — ABNORMAL LOW (ref 6.5–8.3)
SODIUM: 138 mmol/L (ref 135–145)

## 2018-09-23 LAB — CBC
HEMATOCRIT: 33.9 % — ABNORMAL LOW (ref 41.0–53.0)
HEMOGLOBIN: 11.2 g/dL — ABNORMAL LOW (ref 13.5–17.5)
MEAN CORPUSCULAR HEMOGLOBIN CONC: 32.9 g/dL (ref 31.0–37.0)
MEAN CORPUSCULAR HEMOGLOBIN: 32 pg (ref 26.0–34.0)
MEAN PLATELET VOLUME: 8.2 fL (ref 7.0–10.0)
PLATELET COUNT: 232 10*9/L (ref 150–440)
RED BLOOD CELL COUNT: 3.49 10*12/L — ABNORMAL LOW (ref 4.50–5.90)
RED CELL DISTRIBUTION WIDTH: 19.4 % — ABNORMAL HIGH (ref 12.0–15.0)
WBC ADJUSTED: 18.9 10*9/L — ABNORMAL HIGH (ref 4.5–11.0)

## 2018-09-23 LAB — MEAN PLATELET VOLUME: Lab: 8.2

## 2018-09-23 LAB — TACROLIMUS, TROUGH: Lab: 6

## 2018-09-23 LAB — PROTIME: Lab: 13.1

## 2018-09-23 LAB — PHOSPHORUS: Phosphate:MCnc:Pt:Ser/Plas:Qn:: 2.8 — ABNORMAL LOW

## 2018-09-23 LAB — GAMMA GLUTAMYL TRANSFERASE: Gamma glutamyl transferase:CCnc:Pt:Ser/Plas:Qn:: 114 — ABNORMAL HIGH

## 2018-09-23 LAB — MAGNESIUM: Magnesium:MCnc:Pt:Ser/Plas:Qn:: 2

## 2018-09-23 LAB — CALCIUM: Calcium:MCnc:Pt:Ser/Plas:Qn:: 8.5

## 2018-09-23 LAB — AMYLASE: Chemistry studies:Cmplx:-:^Patient:Set:: 34

## 2018-09-23 MED ORDER — GENERIC EXTERNAL MEDICATION
Status: DC
Start: ? — End: 2018-09-23

## 2018-09-23 MED ORDER — INSULIN REGULAR HUMAN 100 UNIT/ML IJ SOLN
0.00 | INTRAMUSCULAR | Status: DC
Start: 2018-09-23 — End: 2018-09-23

## 2018-09-23 MED ORDER — GENERIC EXTERNAL MEDICATION
6.00 | Status: DC
Start: 2018-09-23 — End: 2018-09-23

## 2018-09-23 MED ORDER — SERTRALINE HCL 25 MG PO TABS
25.00 | ORAL_TABLET | ORAL | Status: DC
Start: 2018-09-24 — End: 2018-09-23

## 2018-09-23 MED ORDER — MYCOPHENOLATE MOFETIL 250 MG PO CAPS
500.00 | ORAL_CAPSULE | ORAL | Status: DC
Start: 2018-09-23 — End: 2018-09-23

## 2018-09-23 MED ORDER — HEPARIN SODIUM (PORCINE) 5000 UNIT/ML IJ SOLN
5000.00 | INTRAMUSCULAR | Status: DC
Start: 2018-09-23 — End: 2018-09-23

## 2018-09-23 MED ORDER — VALGANCICLOVIR HCL 450 MG PO TABS
450.00 | ORAL_TABLET | ORAL | Status: DC
Start: 2018-09-24 — End: 2018-09-23

## 2018-09-23 MED ORDER — CIPROFLOXACIN HCL 500 MG PO TABS
500.00 | ORAL_TABLET | ORAL | Status: DC
Start: 2018-09-23 — End: 2018-09-23

## 2018-09-23 MED ORDER — DEXTROSE 50 % IV SOLN
12.50 | INTRAVENOUS | Status: DC
Start: ? — End: 2018-09-23

## 2018-09-23 MED ORDER — MELATONIN 3 MG PO TABS
3.00 | ORAL_TABLET | ORAL | Status: DC
Start: 2018-09-23 — End: 2018-09-23

## 2018-09-23 MED ORDER — NYSTATIN 100000 UNIT/ML MT SUSP
10.00 | OROMUCOSAL | Status: DC
Start: 2018-09-24 — End: 2018-09-23

## 2018-09-23 MED ORDER — GENERIC EXTERNAL MEDICATION
137.00 | Status: DC
Start: 2018-09-24 — End: 2018-09-23

## 2018-09-23 MED ORDER — NALOXONE HCL 0.4 MG/ML IJ SOLN
0.40 | INTRAMUSCULAR | Status: DC
Start: ? — End: 2018-09-23

## 2018-09-23 MED ORDER — SULFAMETHOXAZOLE-TRIMETHOPRIM 400-80 MG PO TABS
1.00 | ORAL_TABLET | ORAL | Status: DC
Start: 2018-09-26 — End: 2018-09-23

## 2018-09-23 MED ORDER — PANTOPRAZOLE SODIUM 40 MG PO TBEC
40.00 | DELAYED_RELEASE_TABLET | ORAL | Status: DC
Start: 2018-09-24 — End: 2018-09-23

## 2018-09-23 MED ORDER — LACTATED RINGERS IV SOLN
10.00 | INTRAVENOUS | Status: DC
Start: ? — End: 2018-09-23

## 2018-09-23 MED ORDER — VALGANCICLOVIR 450 MG TABLET
ORAL_TABLET | Freq: Every day | ORAL | 2 refills | 30 days | Status: CP
Start: 2018-09-23 — End: 2018-12-22
  Filled 2018-09-23: qty 30, 30d supply, fill #0

## 2018-09-23 MED ORDER — CIPROFLOXACIN 500 MG TABLET
ORAL_TABLET | Freq: Two times a day (BID) | ORAL | 0 refills | 7 days | Status: CP
Start: 2018-09-23 — End: 2018-10-10
  Filled 2018-09-23: qty 14, 7d supply, fill #0

## 2018-09-23 MED ORDER — TACROLIMUS 1 MG CAPSULE
ORAL_CAPSULE | Freq: Two times a day (BID) | ORAL | 11 refills | 30 days | Status: CP
Start: 2018-09-23 — End: 2018-09-27

## 2018-09-23 MED ORDER — SULFAMETHOXAZOLE 400 MG-TRIMETHOPRIM 80 MG TABLET
ORAL_TABLET | ORAL | 5 refills | 28 days | Status: CP
Start: 2018-09-23 — End: 2019-03-22
  Filled 2018-09-23: qty 12, 28d supply, fill #0

## 2018-09-23 MED ORDER — PANTOPRAZOLE 40 MG TABLET,DELAYED RELEASE
ORAL_TABLET | Freq: Every day | ORAL | 11 refills | 30 days | Status: CP
Start: 2018-09-23 — End: 2018-10-10
  Filled 2018-09-23: qty 30, 30d supply, fill #0

## 2018-09-23 MED FILL — SULFAMETHOXAZOLE 400 MG-TRIMETHOPRIM 80 MG TABLET: 28 days supply | Qty: 12 | Fill #0 | Status: AC

## 2018-09-23 MED FILL — ASPIRIN 81 MG TABLET,DELAYED RELEASE: 30 days supply | Qty: 30 | Fill #0 | Status: AC

## 2018-09-23 MED FILL — PREDNISONE 5 MG TABLET: 31 days supply | Qty: 32 | Fill #0 | Status: AC

## 2018-09-23 MED FILL — PANTOPRAZOLE 40 MG TABLET,DELAYED RELEASE: 30 days supply | Qty: 30 | Fill #0 | Status: AC

## 2018-09-23 MED FILL — VALGANCICLOVIR 450 MG TABLET: 30 days supply | Qty: 30 | Fill #0 | Status: AC

## 2018-09-23 MED FILL — MG-PLUS-PROTEIN 133 MG TABLET: 50 days supply | Qty: 100 | Fill #0 | Status: AC

## 2018-09-23 MED FILL — ACETAMINOPHEN 325 MG TABLET: 13 days supply | Qty: 100 | Fill #0 | Status: AC

## 2018-09-23 MED FILL — POLYETHYLENE GLYCOL 3350 17 GRAM/DOSE ORAL POWDER: 14 days supply | Qty: 238 | Fill #0 | Status: AC

## 2018-09-23 MED FILL — DOK 100 MG CAPSULE: 15 days supply | Qty: 30 | Fill #0 | Status: AC

## 2018-09-23 MED FILL — PREDNISONE 5 MG TABLET: ORAL | 31 days supply | Qty: 32 | Fill #0

## 2018-09-23 MED FILL — OXYCODONE 5 MG TABLET: 5 days supply | Qty: 40 | Fill #0 | Status: AC

## 2018-09-23 MED FILL — CIPROFLOXACIN 500 MG TABLET: 7 days supply | Qty: 14 | Fill #0 | Status: AC

## 2018-09-23 NOTE — Unmapped (Signed)
[facility] Pharmacist Intervention    Type of intervention: patient enrollment    Medication: all medications    Problem: received notification that patient would fill refills at [facility] post-discharge    Intervention: patient was already enrolled in specialty calls as 2 meds were onboarded and couriered to hospital yesterday per ins requirements. Setting up additional onboarding call for other meds for date pending discharge    Follow up needed: see above    Approximate time spent: 10 minutes    [name]   [facility] Specialty Pharmacist

## 2018-09-23 NOTE — Unmapped (Signed)
Abdominal Transplant Inpatient Pharmacy Education Note    Date: 09/23/2018    Patient: Anthony Alvarado    s/p liver transplant transplant 2/2 cryptogenic cirrhosis on 09/16/18    Discharge Education Provided: ??    ? Provided patient a medication card with list of all discharge medications. Discussed anti-rejection and anti-infective medication regimens in depth. The patient's full medication schedule was discussed in detail with the patient and included drug name, indication, dose, appropriate timing of self-administration, drug monitoring, drug/drug interaction drug/food interaction, herbal/over-the-counter medication use, side effect profile, and generic versus brand formulation.    ? Adverse effects of tacrolimus discussed: Nephrotoxicity, neurotoxicity including headache, tremor and possible seizures and metabolic disturbances including hypertension, hyperglycemia and hyperlipidemia. The patient verbalized understanding regarding not to take their calcineurin inhibitor prior to lab draws to ensure accurate trough levels.    ? Adverse effects of mycophenolic acid: Decreased blood counts, infection and most commonly gastrointestinal distress consisting of diarrhea, nausea and abdominal pain.     ? Adverse effects of prednisone discussed: Hypertension, hyperlipidemia, hyperglycemia, mood swings, insomnia, gastric ulcers.      ? Stressed the importance of medication adherence to transplant outcomes.    ? Myfortic Medication Guide will be given at time of discharge. Discussed the risk of PML.      ? Further discussed the avoidance of grapefruit and grapefruit containing beverages and food as it interacts with their calcineurin inhibitor and the importance of sunscreen use.      The patient demonstrated a excellent understanding of the discharge medications. Wife was present for education. Patient and family member were engaged and asked insightful questions. The patient was instructed to bring the card and pill bottles to their first follow up visit. All questions and concerns were answered.    25-60 minutes of discharge education was completed with this patient.  To promote optimum therapeutic outcomes and prevent medication errors, the patient???s medication profile was reviewed daily by pharmacy and discussed with the multidisciplinary transplant team.     Ester Rink,  PharmD

## 2018-09-23 NOTE — Unmapped (Signed)
Transplant Hepatology Note:    [name] is a 58 year old gentleman with cryptogenic cirrhosis c/b HE and HCC s/p RFA on 05/03/2018 now s/p OLT 09/16/2018.     Recommendations:  1. Immunosuppression:   - S/P basiliximab induction with delayed tac start  - Prednisone taper until POD 30  POD #8 (09/24/18): prednisone 10 mg PO daily  POD #9-14: prednisone 5 mg PO daily (further taper/dosing depending on explant results)  - Continue tacrolimus 6 mg BID, goal trough 8-10 ng/mL  - Continue myfortic 360 mg BID     2. ID Prophylaxis:  - CMV (D-/R+): Moderate risk: valcyte 450 mg daily x 3 months  - PJP: Bactrim SS MWF x 6 months  - Fungal: Low risk (0-1 risk factors): nystatin 10 mL TID until discharge  - Remains on ciprofloxacin for potential intraabdominal infection    3. Continue surveillance MRIs and AFPs for [facility] surveillance    Patient seen and rounded on with the transplant surgery team. Additional plan as per [facility] and consulting teams.    Tawni Carnes, MD, MPH  Transplant Hepatology Fellow, PGY-7  Pager [phone]

## 2018-09-23 NOTE — Unmapped (Signed)
Patient presents without any significant events or changes noted on assessment.  Vital signs remain stable and within normal limits over the duration of this writer's shift.  Minimal complaints of pain noted, on assessment.  Patient required a couple of doses of pain medication over the duration of this writer's shift and they were effective.  A Regular diet is being tolerated well without any incidents of nausea and/or vomiting noted on assessment.  Patient drinking and voiding in adequate amounts over the duration of the day.  Last bowel movement was yesterday 09/22/18.  Patient remains independent with ambulation without any incidents of falls or unsteadiness noted on assessment.  Will continue to monitor patient for understanding of the plan of care, for the duration of the hospitalization through future assessments.         Problem: Adult Inpatient Plan of Care  Goal: Plan of Care Review  Outcome: Not Progressing  Goal: Patient-Specific Goal (Individualization)  Outcome: Not Progressing  Goal: Absence of Hospital-Acquired Illness or Injury  Outcome: Not Progressing  Goal: Optimal Comfort and Wellbeing  Outcome: Not Progressing  Goal: Readiness for Transition of Care  Outcome: Not Progressing  Goal: Rounds/Family Conference  Outcome: Not Progressing     Problem: Wound  Goal: Optimal Wound Healing  Outcome: Not Progressing     Problem: Skin Injury Risk Increased  Goal: Skin Health and Integrity  Outcome: Not Progressing     Problem: Fall Injury Risk  Goal: Absence of Fall and Fall-Related Injury  Outcome: Not Progressing     Problem: Self-Care Deficit  Goal: Improved Ability to Complete Activities of Daily Living  Outcome: Not Progressing     Problem: Diabetes Comorbidity  Goal: Blood Glucose Level Within Desired Range  Outcome: Not Progressing     Problem: Hypertension Comorbidity  Goal: Blood Pressure in Desired Range  Outcome: Not Progressing     Problem: Obstructive Sleep Apnea Risk or Actual (Comorbidity Management)  Goal: Unobstructed Breathing During Sleep  Outcome: Not Progressing     Problem: Pain Chronic (Persistent) (Comorbidity Management)  Goal: Acceptable Pain Control and Functional Ability  Outcome: Not Progressing     Problem: Infection  Goal: Infection Symptom Resolution  Outcome: Not Progressing

## 2018-09-23 NOTE — Unmapped (Signed)
Pharmacist Discharge Note for * Transplant Recipient  Date of admission to [facility]: 09/15/2018  Reason for writing this note: significant event or rationale that led to medication change    Reason for Admission: liver transplant on 09/16/18 due to cryptogenic cirrhosis.     Discharge Date: 09/23/18    Past Medical History:   Diagnosis Date   ??? Aortic valve stenosis    ??? Autoimmune cholangitis    ??? 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 Cholestatic cirrhosis (CMS-HCC)    ??? Hepatic encephalopathy (CMS-HCC)    ??? Hypertension    ??? Sleep apnea    ??? Type 2 diabetes mellitus (CMS-HCC)        Immunosuppression regimen:  Prograf 6 mg BID; goal 8-10 ng/ml  Myfortic 360 mg BID  Prednisone taper; prednisone 10 mg on 09/24/18 then 5 mg daily starting 09/25/18. Duration of steroid to be determined based on explant results.    Antimicrobials during admission:   CMV: D-/R+ -> Valcyte x 3 months (end date 12/17/18)  PJP: Bactrim SS MWF x 6 months (end date 03/18/18)  Nystatin while inpatient  Peri-op abx: piperacillin/tazobactam    Medication changes to be instituted upon discharge:  New medications-  - Immunosuppression: as above  - Antimicrobials as above  - Bowel regimen: Docusate 100 mg BID PRN and Miralax 17 gm daily PRN  - Pain regimen: Oxycodone and APAP PRN   - Ppx: pantoprazole 40 mg daily   - CV/heme: none (aspirin 81 mg daily was HELD on discharge)   - ID: ciprofloxacin 500 mg BID x7 days (09/23/18-09/29/18) for suspected infection (intra-abdominal source)    Continued home medications: sertraline, levothyroxine, azelastine, Eucrisa, fluticasone, Metrogel    Changes in home medications: no changes other than the below medications that were stopped at discharge    Home medications stopped: furosemide, lactulose, levocarnitine, milk thistle, ondansetron, rifaximin, spironolactone, ursodiol, zinc gluconate     Medication related barriers: none    Future refills: none    Potential adverse effects during admission  Since last visit, does patient have YES NO Tac Dose Modification NOTES      Increase Decrease No Change    1 Neurotoxicity (tremor, paresthesias, tingling, seizures, or headache) []  [x]  []  []  []     2 Nephrotoxicity related to tacrolimus []  [x]  []  []  []     3 Diarrhea, constipation [x]  []  []  []  [x]     4 Peripheral edema []  [x]  []  []  []     5 Hypertension []  [x]  []  []  []     6 Hyperglycemia []  [x]  []  []  []     7 Other adverse events []  [x]  []  []  []         Suggested monitoring for outpatient follow-up:   - Explant results to determine etiology of cryptogenic cirrhosis and prednisone dose. Patient was discharged on prednisone 5 mg daily.  - Start aspirin 81 mg daily outpatient (held on discharge due to sanguineous drains and bleeding intra-op).    Ester Rink,  PharmD

## 2018-09-23 NOTE — Unmapped (Signed)
AVS/discharge instructions reviewed with pt. Pt verbalized understanding of discharge instructions. PIVs removed. Education materials on Caring for your [place] [phone]) discussed and given to patient with discharge instructions. Patient verbalized understanding. Medications delivered to bedside. Pt's wife providing transportation home. No questions/concerns at this time.         Problem: Adult Inpatient Plan of Care  Goal: Plan of Care Review  Outcome: Discharged to Home  Goal: Patient-Specific Goal (Individualization)  Outcome: Discharged to Home  Goal: Absence of Hospital-Acquired Illness or Injury  Outcome: Discharged to Home  Goal: Optimal Comfort and Wellbeing  Outcome: Discharged to Home  Goal: Readiness for Transition of Care  Outcome: Discharged to Home  Goal: Rounds/Family Conference  Outcome: Discharged to Home     Problem: Wound  Goal: Optimal Wound Healing  Outcome: Discharged to Home     Problem: Skin Injury Risk Increased  Goal: Skin Health and Integrity  Outcome: Discharged to Home     Problem: Fall Injury Risk  Goal: Absence of Fall and Fall-Related Injury  Outcome: Discharged to Home     Problem: Self-Care Deficit  Goal: Improved Ability to Complete Activities of Daily Living  Outcome: Discharged to Home     Problem: Diabetes Comorbidity  Goal: Blood Glucose Level Within Desired Range  Outcome: Discharged to Home     Problem: Hypertension Comorbidity  Goal: Blood Pressure in Desired Range  Outcome: Discharged to Home     Problem: Obstructive Sleep Apnea Risk or Actual (Comorbidity Management)  Goal: Unobstructed Breathing During Sleep  Outcome: Discharged to Home     Problem: Pain Chronic (Persistent) (Comorbidity Management)  Goal: Acceptable Pain Control and Functional Ability  Outcome: Discharged to Home     Problem: Infection  Goal: Infection Symptom Resolution  Outcome: Discharged to Home

## 2018-09-23 NOTE — Unmapped (Signed)
[facility] orders.

## 2018-09-24 MED ORDER — LEVOTHYROXINE 150 MCG TABLET
ORAL_TABLET | Freq: Every day | ORAL | 11 refills | 30.00000 days
Start: 2018-09-24 — End: 2019-09-24

## 2018-09-24 NOTE — Unmapped (Signed)
Reviewed patient pill box and it was not filled with Levothyroxine and zoloft.  Patient with only levothyroxine 150 mcg at home since PCP prescribed it.  Reviewed chart and no current TSH on file.  Patient stated PCP recently increased it.  Called Dr. Celine Mans and confirmed patient is to put the 150 mcg in his pill box and we will recheck his TSH levels at a latter time.  Confirmed patient is to continue to hold ASA since he had some bleeding and low platelets post op.  Instructed patient to add Zoloft and Levothyroxine to his pill box.  Patient confirmed he feels well and denied any fever, nausea or vomiting.  Reported a 96.9 temp, weight 172.3 pounds, Blood sugar 130.  Has had one loose BM.  Educated patient about need for calling us with any diarrhea.  Confirmed no use of current stool softners and using tylenol during the day and oxycodone at night for pain management. Educated patient to drink plenty of fluids 80-100 ounces and to weigh self every day.  Spent approximately one hour in care of patient.

## 2018-09-24 NOTE — Unmapped (Signed)
POST DISCHARGE FOLLOW UP CALL  Call to patient today to check on status since discharge. The following questions were asked:     How are you feeling right now? Is your pain controlled? yes    Are you taking your pain medication? no, maybe overdid it. May try a Tylenol soon. Walking in the house.    Speaking of medicine, do you have all of your medications? yes Are there medications on your medication list that are missing? no    Do you have a BP cuff, scale and thermometer? yes    How is your BP? (Unless instructed otherwise, patient is supposed to call if > 160/100 or < 100/60) it was norma;l    Do you have any new swelling? no, haven't noticed any  Is your swelling the same?...worse?[name]...or better? Then when you left the hospital.same my hands looked good elevated his hands in hospital but now look normal and ankles look good.    Are you eating and drinking Ok? yes    Any nausea, vomiting or diarrhea? no    Are you passing your urine OK? Yes better now than it was    Did you go home with your bladder urinary (foley) catheter ? no  Or JP drain? yes, that's the aggravating-est thing  How is the output from your urinary catheter or JP? About an ounce and a half, measuring twice a day.    Any issues with your wound?[name]..drainage?...have you looked at your incision? clean, dry, intact my incision looks ok, nothing come out of it, no smell.    Any other questions or concerns? no

## 2018-09-25 MED ORDER — PREDNISONE 5 MG TABLET
ORAL_TABLET | Freq: Every day | ORAL | 11 refills | 32 days | Status: CP
Start: 2018-09-25 — End: 2018-09-27

## 2018-09-26 ENCOUNTER — Encounter: Admit: 2018-09-26 | Discharge: 2018-09-26 | Payer: MEDICARE

## 2018-09-26 ENCOUNTER — Encounter: Admit: 2018-09-26 | Discharge: 2018-09-26 | Payer: BLUE CROSS/BLUE SHIELD

## 2018-09-26 ENCOUNTER — Ambulatory Visit: Admit: 2018-09-26 | Discharge: 2018-09-26 | Payer: MEDICARE

## 2018-09-26 DIAGNOSIS — Z0000 Encounter for general adult medical examination without abnormal findings: Secondary | ICD-10-CM

## 2018-09-26 DIAGNOSIS — E039 Hypothyroidism, unspecified: Secondary | ICD-10-CM

## 2018-09-26 DIAGNOSIS — Z4823 Encounter for aftercare following liver transplant: Secondary | ICD-10-CM

## 2018-09-26 DIAGNOSIS — Z944 Liver transplant status: Principal | ICD-10-CM

## 2018-09-26 DIAGNOSIS — Z79899 Other long term (current) drug therapy: Secondary | ICD-10-CM

## 2018-09-26 DIAGNOSIS — K7460 Unspecified cirrhosis of liver: Secondary | ICD-10-CM

## 2018-09-26 LAB — COMPREHENSIVE METABOLIC PANEL
ALBUMIN: 3.4 g/dL — ABNORMAL LOW (ref 3.5–5.0)
ALKALINE PHOSPHATASE: 80 U/L (ref 38–126)
ALT (SGPT): 96 U/L — ABNORMAL HIGH (ref <50)
ANION GAP: 8 mmol/L (ref 7–15)
AST (SGOT): 47 U/L (ref 19–55)
BILIRUBIN TOTAL: 1.4 mg/dL — ABNORMAL HIGH (ref 0.0–1.2)
BLOOD UREA NITROGEN: 18 mg/dL (ref 7–21)
BUN / CREAT RATIO: 22
CALCIUM: 8.4 mg/dL — ABNORMAL LOW (ref 8.5–10.2)
CHLORIDE: 109 mmol/L — ABNORMAL HIGH (ref 98–107)
CO2: 20 mmol/L — ABNORMAL LOW (ref 22.0–30.0)
CREATININE: 0.82 mg/dL (ref 0.70–1.30)
EGFR CKD-EPI AA MALE: >90 mL/min/{1.73_m2} (ref >=60)
EGFR CKD-EPI NON-AA MALE: >90 mL/min/{1.73_m2} (ref >=60)
GLUCOSE RANDOM: 103 mg/dL (ref 70–179)
POTASSIUM: 4.3 mmol/L (ref 3.5–5.0)
PROTEIN TOTAL: 5.7 g/dL — ABNORMAL LOW (ref 6.5–8.3)

## 2018-09-26 LAB — SMEAR REVIEW: Lab: 0

## 2018-09-26 LAB — DONOR LOW RES HLA DPB #1

## 2018-09-26 LAB — CBC W/ AUTO DIFF
BASOPHILS ABSOLUTE COUNT: 0.1 10*9/L (ref 0.0–0.1)
BASOPHILS RELATIVE PERCENT: 0.7 %
EOSINOPHILS ABSOLUTE COUNT: 0.6 10*9/L — ABNORMAL HIGH (ref 0.0–0.4)
EOSINOPHILS RELATIVE PERCENT: 3.8 %
HEMOGLOBIN: 11.3 g/dL — ABNORMAL LOW (ref 13.5–17.5)
LARGE UNSTAINED CELLS: 1 % (ref 0–4)
LYMPHOCYTES ABSOLUTE COUNT: 2.1 10*9/L (ref 1.5–5.0)
LYMPHOCYTES RELATIVE PERCENT: 14.1 %
MEAN CORPUSCULAR HEMOGLOBIN CONC: 33.3 g/dL (ref 31.0–37.0)
MEAN CORPUSCULAR HEMOGLOBIN: 33.5 pg (ref 26.0–34.0)
MEAN CORPUSCULAR VOLUME: 100.5 fL — ABNORMAL HIGH (ref 80.0–100.0)
MONOCYTES ABSOLUTE COUNT: 0.7 10*9/L (ref 0.2–0.8)
MONOCYTES RELATIVE PERCENT: 4.5 %
NEUTROPHILS ABSOLUTE COUNT: 11.4 10*9/L — ABNORMAL HIGH (ref 2.0–7.5)
RED BLOOD CELL COUNT: 3.38 10*12/L — ABNORMAL LOW (ref 4.50–5.90)
RED CELL DISTRIBUTION WIDTH: 19.7 % — ABNORMAL HIGH (ref 12.0–15.0)
WBC ADJUSTED: 15 10*9/L — ABNORMAL HIGH (ref 4.5–11.0)

## 2018-09-26 LAB — DECEASED DONOR CL I&II, LOW RES
DONOR LOW RES DRW #1: 52
DONOR LOW RES DRW #2: 53
DONOR LOW RES HLA A #1: 2
DONOR LOW RES HLA A #2: 3
DONOR LOW RES HLA B #1: 13
DONOR LOW RES HLA B #2: 60
DONOR LOW RES HLA BW #1: 4
DONOR LOW RES HLA BW #2: 6
DONOR LOW RES HLA C #1: 10
DONOR LOW RES HLA C #2: 6
DONOR LOW RES HLA DQ #1: 2
DONOR LOW RES HLA DQ #2: 8
DONOR LOW RES HLA DR #1: 17

## 2018-09-26 LAB — EGFR CKD-EPI NON-AA MALE: Lab: >90

## 2018-09-26 LAB — MAGNESIUM: Magnesium:MCnc:Pt:Ser/Plas:Qn:: 1.6

## 2018-09-26 LAB — PHOSPHORUS: Phosphate:MCnc:Pt:Ser/Plas:Qn:: 2.7 — ABNORMAL LOW

## 2018-09-26 LAB — GAMMA GLUTAMYL TRANSFERASE: Gamma glutamyl transferase:CCnc:Pt:Ser/Plas:Qn:: 110 — ABNORMAL HIGH

## 2018-09-26 LAB — BILIRUBIN DIRECT: Bilirubin.glucuronidated+Bilirubin.albumin bound:MCnc:Pt:Ser/Plas:Qn:: 0.6 — ABNORMAL HIGH

## 2018-09-26 LAB — TACROLIMUS BLOOD: Lab: 10.7

## 2018-09-26 LAB — LYMPHOCYTES RELATIVE PERCENT: Lab: 14.1

## 2018-09-26 NOTE — Unmapped (Signed)
FOLLOW UP CLINIC NOTE       Date of Service: 09/26/2018    Current complaint: Follow up for liver transplant      Assessment and Plan:   Anthony Alvarado is a 58 y.o. male who underwent OLT on 09/16/2018. Patient did well post-operatively. Today patient has an issue with a leaking abdominal drain. Will remove one drain and suture the other one. Will initiate aspirin 81 mg daily today. Tacrolimus level is 10.7 mg today - total dose adjusted to 11 mg total. Path with no HCC invasion; PAS-positive globules potentially c/w A1AT - will review pathology and send phenotyping.    # Immunosuppression:   - Prograf 6 mg qAM/5 mg qPM; goal 8-10 ng/ml   - Myfortic 360 mg BID   - Prednisone 5 mg daily with duration/dose pending review of pathology and result of A1AT phenotyping    # Prophylaxis:   - CMV: D-/R+ -> Valcyte x 3 months (end date 12/17/18)   - PJP: Bactrim SS MWF x 6 months (end date 03/18/18)  - Continue ciprofloxacin 500 mg daily (until 8/7)    # Drain management:  - Removed abdominal drain today  - Educated on proper post-operative drain management    # Misc:  - Will send alpha-1 phenotype  - Initiate ASA 81 mg daily  - Due for MRIs q3 months    Tawni Carnes, MD, MPH  Transplant Hepatology Fellow, PGY-7  Pager [phone]    Patient seen and discussed with Dr. Celine Mans    History of Present Illness:   Anthony Alvarado is a 58 y.o. male with cryptogenic cirrhosis c/b HE and HCC s/p RFA on 05/03/2018 now s/p OLT 09/16/2018. Post-operative course was relatively uncomplicated. He was discharged on 7/31 and is doing well. His abdominal drain was leaking over the weekend because they were not left to suction.    He endorses some mild abdominal pain (managed with oxycodone 5 mg qhs, tylenol 325 mg QID). Denies fevers, nausea/vomiting, headaches, tremor, neuropathy. He is eating well (ate cereal and milk, lima beans, crackers, sausage yesterday) and staying well hydrated. Ambulating well. At least two BMs per day and no issues urinating. Labs demonstrate improving leukocytosis, stable H&H, Na 137, Cr 0.8, Tbili 1.4, AST 47, ALT 96, ALP 80. Tacrolimus trough on 7/31 of 6.0. S/p ~1L out of both drains today in clinic.    Physical Exam:  BP 129/75 (BP Site: L Arm, BP Position: Sitting, BP Cuff Size: Medium)  - Pulse 60  - Temp 36.6 ??C (Temporal)  - Ht 172.7 cm (5' 8)  - Wt 79.4 kg (175 lb)  - SpO2 100%  - BMI 26.61 kg/m??   General Appearance:  No acute distress, well appearing and well nourished.   Head:  Normocephalic, atraumatic.   Eyes:  No scleral icterus.   Pulmonary:    Normal respiratory effort.   Cardiovascular:  Regular rate and rhythm.   Abdomen:   Abdominal wound with staples in place; two abdominal drains in place with serous output   Neurologic: Non-focal exam.         Lab Results   Component Value Date    WBC 15.0 (H) 09/26/2018    HGB 11.3 (L) 09/26/2018    HCT 34.0 (L) 09/26/2018    PLT 256 09/26/2018     Lab Results   Component Value Date    NA 137 09/26/2018    K 4.3 09/26/2018    CL 109 (H) 09/26/2018  CO2 20.0 (L) 09/26/2018    BUN 18 09/26/2018    CREATININE 0.82 09/26/2018    CALCIUM 8.4 (L) 09/26/2018    MG 1.6 09/26/2018    PHOS 2.7 (L) 09/26/2018      Lab Results   Component Value Date    ALKPHOS 80 09/26/2018    BILITOT 1.4 (H) 09/26/2018    BILIDIR 0.60 (H) 09/26/2018    PROT 5.7 (L) 09/26/2018    ALBUMIN 3.4 (L) 09/26/2018    ALT 96 (H) 09/26/2018    AST 47 09/26/2018    GGT 110 (H) 09/26/2018      Lab Results   Component Value Date    APTT 24.2 (L) 09/18/2018        Liver explant:  A: Peritoneal tissue, biopsy  - Fibroadipose tissue with chronic inflammation  - No carcinoma identified  ??  B: Peritoneal tissue, biopsy  - Fibroadipose tissue with chronic inflammation  - No carcinoma identified  ??  C: Liver and gallbladder, explant, hepatectomy  - Well differentiated hepatocellular carcinoma, size 3.5 cm, in segment VI, status post ablation with extensive necrosis (stage ypT1b)  - No vascular invasion identified  - Vascular and bile duct margins negative for carcinoma  - Focal high grade dysplastic nodule with small cell change in segment V, size 0.5 cm (block C11)  - One lymph node with lipogranulomas and no involvement by carcinoma identified (0/1)  - See synoptic report and diagnosis comment  ??  Background liver:  - Cirrhosis (stage 4 fibrosis) with cholestasis, feathery and ballooning degeneration of hepatocytes, regenerative nodule formation, and rare macrovesicular steatosis (<1%)  - Liver weight 1875 g  - Fibrous septa with chronic inflammation and bile ductular reaction  - PAS-D positive cytoplasmic globules and mild hepatocyte iron present (see comment)  ??  D: Donor gallbladder, cholecystectomy  - Minimal acute and chronic cholecystitis  - Acute serositis  - No malignancy identified   ??  E: Liver, biopsy  - Rare acute inflammation and mild microvesicular steatosis, suggestive of minimal ischemia-reperfusion injury, with rare macrovesicular steatosis (<2%)  - No significant increase in fibrosis identified  - No hepatocyte necrosis identified  - See comment    IMAGING:  Xr Chest Portable    Result Date: 09/17/2018  EXAM: XR CHEST PORTABLE DATE: 09/17/2018 4:28 AM ACCESSION: [id number] UN DICTATED: 09/17/2018 7:48 AM INTERPRETATION LOCATION: [facility] CLINICAL INDICATION: 58 years old Male with POSTSURGICAL STATUS  COMPARISON: 09/16/2018 TECHNIQUE: Portable Chest Radiograph. FINDINGS: Unchanged support devices. The lungs are improving with less pulmonary interstitial prominence There is no pneumothorax. Stable cardiomediastinal silhouette.     As above    Xr Chest Portable    Result Date: 09/16/2018  EXAM: XR CHEST PORTABLE DATE: 09/16/2018 5:00 PM ACCESSION: [id number] UN DICTATED: 09/16/2018 5:00 PM INTERPRETATION LOCATION: [facility] CLINICAL INDICATION: 58 years old Male with POSTSURGICAL STATUS  COMPARISON: Chest radiograph dated 09/15/2018 and prior. TECHNIQUE: Portable Chest Radiograph. FINDINGS: Endotracheal tube in place with tip terminating approximately 4.6 cm from the carina. Nonweighted enteric tube with tip in the mid thoracic esophagus. Right IJ approach CVC with tip at cavoatrial junction. Lungs are mildly hypoinflated. Patchy atelectasis in the right midlung zone. No focal consolidation or pulmonary edema. Similar appearing elevated right hemidiaphragm. No pleural effusion or pneumothorax. Unremarkable cardiomediastinal silhouette. Postsurgical changes in the right upper abdomen.     -- Malpositioned nonweighted enteric tube with tip in the mid thoracic esophagus. The findings of this study were discussed via telephone with DR.  [facility] by Dr. Arnaldo Natal on 09/16/2018 5:03 PM.    Karie Georges Chest 2 Views    Result Date: 09/15/2018  EXAM: XR CHEST 2 VIEWS DATE: 09/15/2018 6:09 PM ACCESSION: [id number] UN DICTATED: 09/15/2018 6:09 PM INTERPRETATION LOCATION: [facility] CLINICAL INDICATION: 58 years old Male with Potential liver transplant  COMPARISON: Chest radiograph 09/14/2018 TECHNIQUE: PA and Lateral Chest Radiographs. FINDINGS: Interval improved aeration at the right lung base. Linear right basilar opacities are likely residual atelectasis. Similar appearing elevated right hemidiaphragm. Likely trace right pleural effusion.. No significant pneumothorax. Stable cardiomediastinal silhouette.     Improved aeration at the right lung base with similar appearing elevated right hemidiaphragm. Likely trace right pleural effusion     US Liver Transplant    Result Date: 09/16/2018  EXAM: US LIVER TRANSPLANT DATE: 09/16/2018 7:02 PM ACCESSION: [id number] UN DICTATED: 09/16/2018 7:03 PM INTERPRETATION LOCATION: [facility] CLINICAL INDICATION: 58 year old Male. Check vascular patency  TECHNIQUE: Ultrasound views of the complete abdomen were obtained using gray scale and color and spectral Doppler imaging. COMPARISON: Liver Doppler ultrasound dated 09/13/2018. FINDINGS: HEPATOBILIARY: The liver is mildly enlarged with heterogeneous echogenicity. No focal hepatic lesions. No intrahepatic biliary ductal dilatation. The common bile duct is normal in caliber. The gallbladder is surgically absent. There is a 6.7 x 6.4 x 1.2cm heterogeneous area within the peripheral right hepatic lobe which appears subcapsular. PANCREAS: Visualized portion is unremarkable. SPLEEN: Mild splenomegaly, measuring up to 13.9 cm. KIDNEYS: Normal in size and echotexture. No solid masses or calculi. No hydronephrosis. VESSELS: - Portal vein: The main, left and right portal veins are patent with hepatopetal flow. There is mild increased velocity within the main portal vein at the anastomosis, which is likely postsurgical in nature. - Splenic vein: Patent, with hepatopetal flow. - Hepatic veins/IVC: The IVC, left, middle and right hepatic veins are patent with bi/triphasic waveforms. - Hepatic artery: Patent with resistive indices within normal limits. - Visualized proximal aorta:  unremarkable OTHER: Small volume ascites.     -- Patent hepatic transplant vasculature. -- Baseline resistive indices in the hepatic transplant arteries, within normal limits. -- 6.7 x 6.4 x 1.2 cm crescentic collection along the capsule of the right hepatic lobe, likely a hematoma. -- Small volume abdominopelvic ascites. Please see below for data measurements: Liver: 16.1 cm Common bile duct: 0.28 cm Right kidney length: 10.5 cm Left kidney length: 9.6 cm Main portal vein diameter: 0.99 cm Main portal vein pre anastomosis velocity: 0.618 m/s Main portal vein anastomosis velocity: 0.727 m/s Main portal vein post anastomosis velocity: 0.591 m/s Anterior right portal vein velocity: 0.270 m/s Posterior right portal vein velocity: 0.438 m/s Left portal vein velocity: 0.305 m/s Right portal vein flow: hepatopetal Left portal vein flow: hepatopetal Common hepatic artery resistive index: 0.62 and systolic acceleration time 50 msec Right hepatic artery resistive index: 0.53 and systolic acceleration time 33 msec Left hepatic artery resistive index: 0.49 and systolic acceleration time 38 msec Left hepatic vein flow: mono-bi Middle hepatic vein flow: mono-bi Right hepatic vein flow: mono-bi Inferior vena cava flow: mono-bi Splenic vein midline flow: hepatopetal Splenic vein proximal flow: hepatopetal Aorta: partially visualized Inferior vena cava: partially visualized Spleen: 13.9 cm Abdominal free fluid visualized: yes    Echocardiogram W Colorflow Spectral Doppler    Result Date: 09/17/2018  ?? Normal left ventricular systolic function, ejection fraction > 55% ?? Probably congenitally malformed (bicuspid) aortic valve ?? Aortic stenosis - moderate ?? Mitral annular calcification ?? Normal right ventricular systolic function ?? Dilated  ascending aorta - mild      Xr Abdomen 1 View    Result Date: 09/17/2018  EXAM: XR ABDOMEN 1 VIEW DATE: 09/16/2018 7:32 PM ACCESSION: [id number] UN DICTATED: 09/17/2018 7:49 AM INTERPRETATION LOCATION: [facility] CLINICAL INDICATION: 58 years old Male with NGT (CATHETER NON VASC FIT & ADJ)  COMPARISON: September 16, 2018 at 1749 TECHNIQUE: Supine view of the abdomen. FINDINGS: The bowel gas pattern is nonobstructive. The enteric tube terminates in the distal stomach. Skin staples and soft tissue drains are unchanged. No large amount of free air is seen below the diaphragm, limited by supine positioning.     1. Stable postsurgical changes. No acute findings.    Xr Abdomen 1 View    Result Date: 09/16/2018  EXAM: XR ABDOMEN 1 VIEW DATE: 09/16/2018 5:02 PM ACCESSION: [id number] UN DICTATED: 09/16/2018 5:01 PM INTERPRETATION LOCATION: [facility] CLINICAL INDICATION: 58 years old Male with NGT (CATHETER NON VASC FIT & ADJ)  COMPARISON: None. TECHNIQUE: Supine view of the abdomen. FINDINGS: Malpositioned enteric tube with tip in the mid thoracic esophagus. Surgical drains within the right upper quadrant. Surgical staples overlie the midline abdomen. Nonobstructive bowel gas pattern. Lung bases are clear. No acute osseous abnormalities Refer to concurrent chest for findings above the diaphragm.     Malpositioned enteric tube with tip in the mid thoracic esophagus. Recommend advancement by approximately 20 cm.     Xr Abdomen Portable    Result Date: 09/16/2018  EXAM: XR ABDOMEN PORTABLE DATE: 09/16/2018 5:53 PM ACCESSION: [id number] UN DICTATED: 09/16/2018 5:52 PM INTERPRETATION LOCATION: [facility] CLINICAL INDICATION: 58 years old Male with NGT (CATHETER NON VASC FIT & ADJ)  COMPARISON: Same day abdominal radiograph. TECHNIQUE: Supine view of the abdomen. CONCLUSIONS: Nonweighted enteric tube with tip and sidehole in the gastric body  Otherwise, no significant same-day interval changes.    Tee    Result Date: 09/16/2018  Rolanda Jay, MD     09/16/2018  3:51 PM TEE TEE insertion: Atraumatic, without difficulty Purpose: Monitoring Patient location: OR INDICATION(S) Valvular dysfunction: Moderate Modalities used: 2D, Pulse Wave, Continuous Wave and Color Doppler STAFF Performed: attending and resident/CRNA Anesthesiologist: Rolanda Jay, MD Resident/CRNA: Rolland Porter, MD LEFT VENTRICLE Overall appearance: Normal LVH: Moderate Estimated systolic function: EF 45-55% Wall Motion Abnormalities HEMODYNAMICS RIGHT VENTRICLE Overall appearance: Normal RV Function: Normal ATRIAL CHAMBERS Left atrium: Normal Left atrial appendage: Normal Right atrium: Mildly dilated Interatrial Septum: Intact EFFUSIONS Right pleural effusion: no Left pleural effusion: no Pericardial: none VALVES Aortic Valve Aortic valve morphology: Calcification and Bicuspid Aortic Stenosis severity: moderate (AS mod stenosis by peak velocities and gradients, severe by planimetry and doppler velocity index.) Aortic Insufficiency severity: mild Mitral Valve Mitral valve morphology: Thickened Mitral Regurgitation: trace Pulmonary Vein Inflow Tricuspid Valve Tricuspid valve morphology: Grossly normal Pulmonic Valve AORTA Ascending aorta: Grade I Aortic arch: Grade I Descending aorta: Grade I

## 2018-09-26 NOTE — Unmapped (Signed)
Patient has been placed in room 4 early due to leaking at drainage site; Jonna Coup and Amil Amen is aware

## 2018-09-26 NOTE — Unmapped (Signed)
[facility] HOSPITALS TRANSPLANT CLINIC PHARMACY NOTE  09/26/2018   Anthony Alvarado  [id number]    Medication changes today:   1. Start aspirin 81 mg daily  2. Decrease tacrolimus to 6 mg qAM and 5 mg qPM    Education/Adherence tools provided today:  1.provided updated medication list  2. provided additional pill box education  3.  provided additional education on immunosuppression and transplant related medications including reviewing indications of medications, dosing and side effects    Follow up items:  1. goal of understanding indications and dosing of immunosuppression medications  2. Patient to contact PCP to confirm correct dose of levothyroxine  3. Prednisone plan after review of explant and path conference  4. If diarrhea improves upon completion of ciprofloxacin  5. Consider starting statin at a later date  6. Vitamin D level  7. BG in 1 week  8. Vitamin D, calcium, and bisphosphonate for osteoporosis of spine at next visit    Next visit with pharmacy in 1 month  ____________________________________________________________________    Anthony Alvarado is a 58 y.o. male s/p orthotopic liver transplant on 09/16/2018 (Liver) 2/2 cryptogenic cirrhosis w/HCC.    Other PMH significant for HTN, T2DM, OSA, aortic stenosis    Seen by pharmacy today for: medication management and pill box fill and adherence education; last seen by pharmacy first visit     CC:  Patient complains of leaking abdominal drain    Vitals:    09/26/18 1104   BP: 129/75   Pulse: 60   Temp: 36.6 ??C (97.8 ??F)   SpO2: 100%       Allergies   Allergen Reactions   ??? Watermelon Flavor      Mouth itching       All medications reviewed and updated.     Medication list includes revisions made during today???s encounter    Outpatient Encounter Medications as of 09/26/2018   Medication Sig Dispense Refill   ??? acetaminophen (TYLENOL) 325 MG tablet Take 2 tablets (650 mg total) by mouth every six (6) hours as needed for pain or fever (> 38C or 100.61F). 100 tablet 0   ??? aspirin (ECOTRIN) 81 MG tablet Take 1 tablet (81 mg total) by mouth daily. HOLD until directed to start in clinic. (Patient not taking: Reported on 09/24/2018) 30 tablet 11   ??? azelastine (ASTELIN) 137 mcg (0.1 %) nasal spray 1 spray by Each Nare route daily as needed.      ??? blood sugar diagnostic Strp Use as directed Three (3) times a day before meals. 90 each 11   ??? ciprofloxacin HCl (CIPRO) 500 MG tablet Take 1 tablet (500 mg total) by mouth every twelve (12) hours for 7 days. 14 tablet 0   ??? docusate sodium (COLACE) 100 MG capsule Take 1 capsule (100 mg total) by mouth two (2) times a day as needed for constipation. (Patient not taking: Reported on 09/24/2018) 30 capsule 2   ??? EUCRISA 2 % Oint Apply 1 application topically daily as needed.      ??? fluticasone propionate (FLONASE) 50 mcg/actuation nasal spray PLACE ONE OR TWO SPRAYS INTO BOTH NOSTRILS DAILY AS NEEDED.     ??? lancets 33 gauge Misc 1 each by Miscellaneous route Three (3) times a day before meals. 100 each 11   ??? levothyroxine (SYNTHROID) 150 MCG tablet Take 1 tablet (150 mcg total) by mouth daily. 30 tablet 11   ??? magnesium oxide-Mg AA chelate (MAGNESIUM, AMINO ACID CHELATE,) 133 mg Tab Take  1 tablet by mouth Two (2) times a day. HOLD until directed to start by your coordinator. (Patient not taking: Reported on 09/24/2018) 60 tablet 11   ??? metroNIDAZOLE (METROGEL) 1 % gel Apply 1 application topically daily as needed.      ??? mycophenolate (MYFORTIC) 180 MG EC tablet Take 2 tablets (360 mg total) by mouth Two (2) times a day. 120 tablet 11   ??? oxyCODONE (ROXICODONE) 5 MG immediate release tablet Take 1-2 tablets (5-10 mg total) by mouth every six (6) hours as needed for pain. 40 tablet 0   ??? pantoprazole (PROTONIX) 40 MG tablet Take 1 tablet (40 mg total) by mouth daily. 30 tablet 11   ??? polyethylene glycol (MIRALAX) 17 gram/dose powder Take 1 capful (17 g ) dissolved in 4 to 8 ounces of liquid by mouth daily as needed (constipation). (Patient not taking: Reported on 09/24/2018) 238 g 0   ??? predniSONE (DELTASONE) 5 MG tablet On Saturday 8/1: Take 2 tablets (10 mg total) by mouth once. Starting Sunday 8/2: Take 1 tablet (5 mg total) by mouth once daily 32 tablet 11   ??? sertraline (ZOLOFT) 25 MG tablet Take 1 tablet (25 mg total) by mouth daily. 30 tablet 11   ??? sulfamethoxazole-trimethoprim (BACTRIM) 400-80 mg per tablet Take 1 tablet (80 mg of trimethoprim total) by mouth 3 (three) times a week. 12 tablet 5   ??? tacrolimus (PROGRAF) 1 MG capsule Take 6 capsules (6 mg total) by mouth two (2) times a day. 360 capsule 11   ??? valGANciclovir (VALCYTE) 450 mg tablet Take 1 tablet (450 mg total) by mouth daily. 30 tablet 2     Facility-Administered Encounter Medications as of 09/26/2018   Medication Dose Route Frequency Provider Last Rate Last Dose   ??? diazePAM (VALIUM) 5 mg/mL injection                Induction agent : basiliximab    CURRENT IMMUNOSUPPRESSION: tacrolimus 6 mg PO bid  prograf/Envarsus/cyclosporine goal: 8-10   [id number]  mg PO bid    prednisone 5 mg daily for now.  Left at 5 mg until explant biopsy can be reviewed at path conference    Patient complains of temperature sensitivity  and 2-3 loose stools daily    IMMUNOSUPPRESSION DRUG LEVELS:  Lab Results   Component Value Date    Tacrolimus, Trough 6.0 09/23/2018    Tacrolimus, Trough 4.4 (L) 09/22/2018    Tacrolimus, Trough 4.6 (L) 09/21/2018     No results found for: CYCLO  No results found for: EVEROLIMUS  No results found for: SIROLIMUS    Prograf level is accurate 12 hour trough    Graft function: todays labs slightly elevated compared to 3 days ago  DSA: ntd   Explant biopsy: well differentiated HCC with no vascular invasion, possibility of alpha-1 antitrypsin deficiency  Biopsies to date: ntd  WBC/ANC:  15/11.4    Plan: Will decrease  tacrolimus to 6 mg qAM and 5 mg qPM.  Will discuss explant biopsy at 8/12 path conference and determine plan for prednisone.  Will send alpha-1 antitrypsin phenotyping. Continue to monitor.    OI Prophylaxis:   CMV Status: D-/ R+, moderate risk. CMV prophylaxis: valganciclovir 450 mg daily x 3 months per protocol.  No results found for: CMVCP  PCP Prophylaxis: bactrim SS 1 tab MWF x 6 months.  Thrush:  completed in hospital  Patient is  tolerating infectious prophylaxis well    Plan: Continue per protocol.  Continue to monitor.    Potential intraabdominal infection   Meds currently on: ciprofloxacin 500 mg BID (7/31-8/6)  Plan: continue to monitor    CV Prophylaxis: asa 81 mg held at discharge to sanguinous drain output and bleeding intra-op  The 10-year ASCVD risk score Denman George DC Jr., et al., 2013) is: 19.7%  Statin therapy: Indicated; currently on no statin  Plan: consider starting statin at a later date. Start aspirin 81 mg daily. Continue to monitor     BP: Goal < 140/90. Clinic vitals reported above  Home BP ranges: 115/70  Current meds include: none  Plan: within goal. Continue to monitor    Anemia:  H/H:   Lab Results   Component Value Date    HGB 11.3 (L) 09/26/2018     Lab Results   Component Value Date    HCT 34.0 (L) 09/26/2018     Iron panel:  Lab Results   Component Value Date    IRON 157 01/04/2018    TIBC 305.9 01/04/2018    FERRITIN 116.0 01/04/2018     Lab Results   Component Value Date    Iron Saturation (%) 51 (H) 01/04/2018       Prior ESA use: none post transplant    Plan: stable, within goal. Continue to monitor.     DM:   Lab Results   Component Value Date    A1C 4.4 (L) 09/15/2018   . Goal A1c < 7  History of Dm? Yes: T2DM  Established with endocrinologist/PCP for BG managment? Yes: PCP  Currently on: no medication (pre transplant he was on Lantus 8 units HS)  Home BS log:   Breakfast Lunch  Dinner  HS   Date AC PC AC PC [facility] PC    09/24/2018 130  130  181  167   09/25/2018 120  205  308  198   Diet:did not address  Exercise:not yet  Hypoglycemia: no  Plan:  Review BG in 1 week.  He only has Lantus at home. Can consider PO agent (diarrhea may impact therapy selection)    Electrolytes: wnl  Meds currently on: none  Plan: Continue to monitor     GI/BM: Pt reports 2-3 loose stools daily - possibly related to ciprofloxacin; occasional heartburn  Meds currently on: not using colace or Miralax, pantoprazole 40 mg daily (not on pre transplant)  Plan: Consider changing PPI to PRN at next visit. Continue to monitor    Pain: pt reports moderate pain around drain site  Meds currently on: APAP 3-4 times daily, oxycodone 5 mg HS  Plan: Encouraged APAP use prior to oxycodone and NMT 3g per day. Continue to monitor    Bone health:   Vitamin D Level: pending. Goal > 30.   Last DEXA results: osteoporosis of spine 01/04/18 - Spine T score 2.6, femur T score -1.3  Current meds include: none  Plan: Vitamin D level  pending,recommend supplementation with Vitamin D, calcium, and phosphorus at next visit. Continue to monitor.     Women's/Men's Health:  Anthony Alvarado is a 58 y.o. male. Patient reports no men's/women's health issues  Plan: Continue to monitor    Hypothyroidism  Meds currently on: has levothyroxine 150 mcg in his box but thinks that PCP may have recently changed his dose  Plan: Patient to confirm correct dose with PCP.    Anxiety/depression  Meds currently on: sertraline 25 mg daily  Plan: Continue to monitor    Allergies  Meds currently  on: azelastin PRN, Flonase PRN    Rosacea  Meds currently on: metrogel PRN (not using as improved with prednisone), Eucrisa ointment PRN itching  Plan: Continue to monitor    Adherence: Patient has poor understanding of medications; was not able to independently identify names/doses of immunosuppressants and OI meds.  Patient  does fill their own pill box on a regular basis at home.  Patient brought medication card:yes  Pill box:was correct  Plan: Encouraged patient to know IS by heart for next visit; provided extensive adherence counseling/intervention    Spent approximately 40 minutes on educating this patient and greater than 50% was spent in direct face to face counseling regarding post transplant medication education. Questions and concerns were address to patient's satisfaction.    Patient was reviewed with Dr.Desai who was agreement with the stated plan:     During this visit, the following was completed:   BG log data assessment  BP log data assessment  Labs ordered and evaluated  complex treatment plan >1 DS   Patient education was completed for 11-24 minutes     All questions/concerns were addressed to the patient's satisfaction.  __________________________________________  Cleone Slim, PHARMD, CPP  [employer] TRANSPLANT CLINICAL PHARMACIST PRACTITIONER  PAGER [phone]

## 2018-09-26 NOTE — Unmapped (Addendum)
Patient seen in clinic today after Friday evening discharge from liver transplant surgery. He is accompanied by his wife. Called to clinic room by NP Martin-Velez and staff d/t copious drainage around JP sites. Drain and wound site teaching provided by NP. Wife had not been compressing JP bulb after draining serosanguinous drainage. Once compressed several more bulbs full of fluid were obtained/drained, totally over from both sites. Drain site incisions with no apparent redness/drainage and txp incision well approximated without redness/drainage as well. Patient changed clothing and ambulated to cafeteria, bulb #2 now full and dressing at drain sites saturated again/changed. Patient reports he is doing well, pain is well controlled with only tylenol during the daytime and oxycodone at HS. He denies n/v/constipation/fever/chills, but says he is having some loose stools. Educated patient on change in sx and new sx that would require contacting the txp team. He has some resolving ascites but no swelling in his LE. He said his wt has been stable, bp good, but he admits he feels weak. He is drinking well per report, as much as 80 oz of water daily in addition to some sugar free non-caffeinated soda. Provided 15 min.of post-txp education, including the importance of eating high protein foods to promote healing and decreased muscle loss. Patient is not yet setup with HH, contacted txp SW, Shanta, to discuss progress with this, but patient away from local [facility]. Patient seen by txp pharmacist today as well as txp surgeon, Dr.Desai,who had one drain removed, since fluid was less and more serous. Per Dr., this coordinator wil f/u with patient on drainage this Friday,  to determine if patient will return next week for 2nd drain removal, if drainage <200-391ml daily. Patient okay to restart 81mg  asa and tac dose decreased to 6mg /5mg  daily.

## 2018-09-26 NOTE — Unmapped (Signed)
Brief Transplant Hepatology Note    [name] is a 58 yo M with cryptogenic cirrhosis s/p OLT on 09/16/18 who presents for post-op follow-up. He has been doing well but complains of leaking around his abdominal drains with saturation of his dressings, as well as some mild abdominal pain that he manages with oxycodone qHS. He denies constipation and has 1-2 BMs per day. Denies fevers, nausea, vomiting, headaches, or tremors, and has been eating well and staying well-hydrated. Of note, he had not been placing his drains to suction. Today, a total of 1000-1500 cc were drained from his intra-abdominal drains and he had some slight improvement in his abdominal discomfort. One drain was removed today and the other remains with serosanguinous fluid. Review of labs shows overall stable or improved LFTs and tacrolimus trough on 7/31 was 6.0 and repeat trough today 10.7. 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    - Maintain one intra-abdominal drain in place and patient was educated regarding proper drainage and suctioning  - Start ASA 81 mg daily for HAT ppx??  - Send A1AT phenotyping  - Review explant at path conference on 8/12  - Q3 month MRIs for [facility] surveillance  - Adjust tacrolimus to 6 mg qAM, 5 mg qPM with goal trough 8-10  - Continue myfortic 360 mg BID  - Continue prednisone 5 mg daily and determine steroid course after review of explant  - Continue valcyte x 3 months and bactrim x 6 months for ID ppx  - Continue ciprofloxacin 500 mg daily through 8/7    Patient seen with Dr. Waynetta Sandy and Dr. Celine Mans. Please see clinic note today for full details of visit and plan.     Justine Null, MD, [facility]  Transplant Hepatology Fellow  Pager [phone]

## 2018-09-27 LAB — THYROID STIMULATING HORMONE: Thyrotropin:ACnc:Pt:Ser/Plas:Qn:: 23.74 — ABNORMAL HIGH

## 2018-09-27 LAB — VITAMIN D, TOTAL (25OH): Lab: 8.5 — ABNORMAL LOW

## 2018-09-27 LAB — FREE T4: Thyroxine.free:MCnc:Pt:Ser/Plas:Qn:: 0.63 — ABNORMAL LOW

## 2018-09-27 MED ORDER — ASPIRIN 81 MG TABLET,DELAYED RELEASE
ORAL_TABLET | Freq: Every day | ORAL | 11 refills | 30 days | Status: CP
Start: 2018-09-27 — End: 2019-09-27

## 2018-09-27 MED ORDER — TACROLIMUS 1 MG CAPSULE
ORAL_CAPSULE | 11 refills | 0 days | Status: CP
Start: 2018-09-27 — End: 2018-09-30

## 2018-09-27 MED ORDER — PREDNISONE 5 MG TABLET
ORAL_TABLET | Freq: Every day | ORAL | 11 refills | 30 days | Status: CP
Start: 2018-09-27 — End: 2018-10-10

## 2018-09-27 NOTE — Unmapped (Signed)
[facility]  Patient Onboarding/Medication Counseling    [name] is a 58 y.o. male with liver transplant  who I am counseling today on continuation of therapy.  I am speaking to the patient.    Verified patient's date of birth / HIPAA.    Specialty medication(s) to be sent: Transplant: none      Non-specialty medications/supplies to be sent: none      Medications not needed at this time: none             Valcyte (valganciclovir)    Medication & Administration     Dosage:   ? Take one tablet daily     Administration:   ? Take with food  ? Swallow the pills whole, do not break, crush, or chew    Adherence/Missed dose instructions:  ? Take a missed dose as soon as you think about it with food  ? If it is close to your next dose, skip the missed dose and go back to your normal time.  ? Do not take 2 doses at the same time or extra doses.  ? Report any missed doses to coordinator    Goals of Therapy     ? To prevent or treat CMV infection in setting of solid organ transplant    Side Effects & Monitoring Parameters     ? Common side effects  ? Headache  ? Diarrhea or constipation  ? Appetite or sleep disturbances  ? Back, muscle, joint, or belly pain  ? Weight loss  ? Dizziness  ? Muscle spasm  ? Upset stomach or vomiting    ? The following side effects should be reported to the provider:  ? Allergic reaction  (rash, hives, swelling, blistered or peeling skin, shortness of breath)  ? Infection (fever, chills, sore throat, ear/sinus pain, cough, sputum change, urinary pain, mouth sores, non-healing wounds)  ? Bleeding (cough ground vomit, blood in urine, black/red/tarry stools, unexplained bruising or bleeding)  ? Electrolyte problems (mood changes, confusion, weakness, abnormal heartbeat, seizures)  ? Kidney problems (urine changes, weight gain)  ? Yellowing skin or eyes  ? Swelling in arms, legs, stomach  ? Severe dizziness or passing out  ? Eye issues (eyesight changes, pain, or irritation)  ? Night sweats    ? Monitoring parameters  ? Have eye exam as directed by doctor  ? CMV counts  ? CBC  ? Renal function  ? Pregnancy test prior to initiation    Contraindications, Warnings, & Precautions     ? BBW: severe leukopenia, neutropenia, anemia, thrombocytopenia, pancytopenia, and bone marrow failure, including aplastic anemia have been reported  ? BBW: may cause temporary or permanent inhibition of spermatogenesis and suppression of fertilty; has the potential to cause birth defects and cancers in humans  ? Male patients should have pregnancy test prior to initiation and use birth control for at least 30 days after discontinuation  ? Male patients should use a barrier contraceptive while on therapy and for 90 days after discontinuation  ? Acute renal failure  ? Not indicated for use in liver transplant recipients  ? Breastfeeding is not recommended    Drug/Food Interactions     ? Medication list reviewed in Epic. The patient was instructed to inform the care team before taking any new medications or supplements. No drug interactions identified.   ? Check with your doctor before getting any vaccinations (live or inactivated)    Storage, Handling Precautions, & Disposal     ?  Store at room temperature  ? Keep away from children and pets      Current Medications (including OTC/herbals), Comorbidities and Allergies     Current Outpatient Medications   Medication Sig Dispense Refill   ??? acetaminophen (TYLENOL) 325 MG tablet Take 2 tablets (650 mg total) by mouth every six (6) hours as needed for pain or fever (> 38C or 100.32F). 100 tablet 0   ??? aspirin (ECOTRIN) 81 MG tablet Take 1 tablet (81 mg total) by mouth daily. 30 tablet 11   ??? azelastine (ASTELIN) 137 mcg (0.1 %) nasal spray 1 spray by Each Nare route daily as needed.      ??? blood sugar diagnostic Strp Use as directed Three (3) times a day before meals. 90 each 11   ??? ciprofloxacin HCl (CIPRO) 500 MG tablet Take 1 tablet (500 mg total) by mouth every twelve (12) hours for 7 days. 14 tablet 0   ??? docusate sodium (COLACE) 100 MG capsule Take 1 capsule (100 mg total) by mouth two (2) times a day as needed for constipation. (Patient not taking: Reported on 09/24/2018) 30 capsule 2   ??? EUCRISA 2 % Oint Apply 1 application topically daily as needed.      ??? fluticasone propionate (FLONASE) 50 mcg/actuation nasal spray PLACE ONE OR TWO SPRAYS INTO BOTH NOSTRILS DAILY AS NEEDED.     ??? lancets 33 gauge Misc 1 each by Miscellaneous route Three (3) times a day before meals. 100 each 11   ??? levothyroxine (SYNTHROID) 150 MCG tablet Take 1 tablet (150 mcg total) by mouth daily. 30 tablet 11   ??? magnesium oxide-Mg AA chelate (MAGNESIUM, AMINO ACID CHELATE,) 133 mg Tab Take 1 tablet by mouth Two (2) times a day. HOLD until directed to start by your coordinator. (Patient not taking: Reported on 09/24/2018) 60 tablet 11   ??? metroNIDAZOLE (METROGEL) 1 % gel Apply 1 application topically daily as needed.      ??? mycophenolate (MYFORTIC) 180 MG EC tablet Take 2 tablets (360 mg total) by mouth Two (2) times a day. 120 tablet 11   ??? oxyCODONE (ROXICODONE) 5 MG immediate release tablet Take 1-2 tablets (5-10 mg total) by mouth every six (6) hours as needed for pain. 40 tablet 0   ??? pantoprazole (PROTONIX) 40 MG tablet Take 1 tablet (40 mg total) by mouth daily. 30 tablet 11   ??? polyethylene glycol (MIRALAX) 17 gram/dose powder Take 1 capful (17 g ) dissolved in 4 to 8 ounces of liquid by mouth daily as needed (constipation). (Patient not taking: Reported on 09/24/2018) 238 g 0   ??? predniSONE (DELTASONE) 5 MG tablet Take 1 tablet (5 mg total) by mouth daily. 30 tablet 11   ??? sertraline (ZOLOFT) 25 MG tablet Take 1 tablet (25 mg total) by mouth daily. 30 tablet 11   ??? sulfamethoxazole-trimethoprim (BACTRIM) 400-80 mg per tablet Take 1 tablet (80 mg of trimethoprim total) by mouth 3 (three) times a week. 12 tablet 5   ??? tacrolimus (PROGRAF) 1 MG capsule Take 6 capsules (6 mg) by mouth in the morning and 5 capsules (5 mg) by mouth at night. 330 capsule 11   ??? valGANciclovir (VALCYTE) 450 mg tablet Take 1 tablet (450 mg total) by mouth daily. 30 tablet 2     No current facility-administered medications for this visit.      Facility-Administered Medications Ordered in Other Visits   Medication Dose Route Frequency Provider Last Rate Last Dose   ???  diazePAM (VALIUM) 5 mg/mL injection                Allergies   Allergen Reactions   ??? Watermelon Flavor      Mouth itching       Patient Active Problem List   Diagnosis   ??? Type 2 diabetes mellitus (CMS-HCC)   ??? Hypothyroidism   ??? Liver cirrhosis (CMS-HCC)   ??? OSA (obstructive sleep apnea)   ??? Hypertension   ??? Encounter for pre-transplant evaluation for chronic liver disease   ??? Pleural effusion   ??? Pneumothorax after biopsy   ??? Liver replaced by transplant (CMS-HCC)       Reviewed and up to date in Epic.    Appropriateness of Therapy     Is medication and dose appropriate based on diagnosis? Yes    Baseline Quality of Life Assessment      How many days over the past month did your liver transplant keep you from your normal activities? 0    Financial Information     Medication Assistance provided: None Required    Anticipated copay of $0   reviewed with patient. Verified delivery address.    Delivery Information     Scheduled delivery date: Patient has 3 weeks of medication on hand.    Expected start date: Patient is currently taking valganciclovir but doesn't need it from [facility] at this time.    Medication will be delivered via UPS to the home address in [place] [facility]  This shipment will not require a signature.      Explained the services we provide at [facility] and that each month we would call to set up refills.  Stressed importance of returning phone calls so that we could ensure they receive their medications in time each month.  Informed patient that we should be setting up refills 7-10 days prior to when they will run out of medication. A pharmacist will reach out to perform a clinical assessment periodically.  Informed patient that a welcome packet and a drug information handout will be sent.      Patient verbalized understanding of the above information as well as how to contact the pharmacy at [phone] option 4 with any questions/concerns.  The pharmacy is open Monday through Friday 8:30am-4:30pm.  A pharmacist is available 24/7 via pager to answer any clinical questions they may have.    Patient Specific Needs     ? Does the patient have any physical, cognitive, or cultural barriers? No    ? Patient prefers to have medications discussed with  Patient     ? Is the patient able to read and understand education materials at a high school level or above? Yes    ? Patient's primary language is  English     ? Is the patient high risk? Yes, patient taking a REMS drug     ? Does the patient require a Care Management Plan? No     ? Does the patient require physician intervention or other additional services (i.e. nutrition, smoking cessation, social work)? No      [name]  [facility] Specialty Pharmacist

## 2018-09-28 MED ORDER — ERGOCALCIFEROL (VITAMIN D2) 1,250 MCG (50,000 UNIT) CAPSULE: 50000 [IU] | capsule | 1 refills | 28 days | Status: AC

## 2018-09-28 MED ORDER — ERGOCALCIFEROL (VITAMIN D2) 1,250 MCG (50,000 UNIT) CAPSULE
ORAL_CAPSULE | ORAL | 1 refills | 28.00000 days | Status: CP
Start: 2018-09-28 — End: 2018-09-28

## 2018-09-28 MED ORDER — CHOLECALCIFEROL (VITAMIN D3) 25 MCG (1,000 UNIT) TABLET: 2000 [IU] | tablet | Freq: Every day | 11 refills | 30 days | Status: AC

## 2018-09-28 NOTE — Unmapped (Addendum)
Patient's lab results from 8/3 with abnormal thyroid and Vit D. Per txp pharmacist, patient's pcp will manage his thyroid, but will start 50,000 units vitamin d weekly x 8 wks, then start 2000units daily on 9/30. Attempted to esend prescriptions, but they only printed. Called large dose Vit D rx [facility]'s local [facility]. Patient's cxr concening fo atelectasis or infection. Reviewed by Dr.Kim felt it was more likely atelectasis and said to monitor effect of lasix on it. Spoke to patient today (8/18) and relayed recommendation. He verbalized understanding and said he took lasix this morning, but had not noticed any changes yet by this afternoon. Encouraged him to contact txo or coordinator if his breathing did not improved.

## 2018-09-28 NOTE — Unmapped (Signed)
Discharge Summary    Admit date: 09/15/2018    Discharge date and time: 09/23/2018    Discharge to:  Home    Discharge Service: Surg Transplant [facility])    Discharge Attending Physician: No att. providers found    Discharge  Diagnoses: cirrhosis s/p OLT    Secondary Diagnosis: Active Problems:    Liver transplant recipient (CMS-HCC) POA: Not Applicable  Resolved Problems:    * No resolved hospital problems. *      OR Procedures:    Bilateral - LIVER ALLOTRANSPLANTATION; ORTHOTOPIC, PARTIAL OR WHOLE, FROM CADAVER OR LIVING DONOR, ANY AGE  BACKBNCH STD PREP CAD DONOR WHOLE LIVER GFT PRIOR TNSPLNT,INC CHOLE,DISS/REM SURR TISSU WO TRISEG/LOBE SPLT  Date  09/16/2018  -------------------     Ancillary Procedures: no procedures    Discharge Day Services: the patient was seen and evaluated by transplant surgery and deemed suitable for discharge.     Subjective   No acute events overnight.    Objective   No data found.  No intake/output data recorded.    General Appearance:   No acute distress  Lungs:                Normal work of breathing on room air  Heart:                           Regular rate and rhythm  Abdomen:                Soft, non-tender incision c/d/i with staples, JP drains x 2 with serosanguinous output  Extremities:              Warm and well perfused      Hospital Course:  [name] is a 58 y.o. male with history notable for autoimmune cholangitis, carrier of hemochromatosis HFE gene mutation, cholestatic cirrhosis, and ESLD. He is s/p OLT on 09/16/2018. [name] was noted to have an approximate blood loss of 5L and received 5FFP, 3pRBCs, and 2 Cryo intraoperatively. Postoperatively, he remained intubated and was transferred to the SICU for critical care management. Immunosupression induction was completed with Campath and methylprednisolone and was continued with methylprednisolone taper, tacrolimus, and mycophenolate. He was given all appropriate prophylaxis.     Hepatic U/S on POD 0 was notable for patent vasculature and appropriate perfusion. Overnight, the patient's urine output dropped and his creatine, which was 1.04 on admission, rose to 1.77. For the remainder of his stay, the patient's creatinine steadily dropped and was WNL on discharge. His immediate postoperative course was also complicated by low Hgb, which was corrected by transfusions of pRBCs.     The patient was extubated in the SICU and was weaned to RA. He was transferred to ISCU on 09/19/2018, and then to the floor on 09/20/2018. Due to an uptrending leukocytosis, he was monitored an additional day inpatient and started on PO ciprofloxacin. His WBC was stable the following day without signs or symptoms of infection. Infectious work-up was negative. JP drains were kept in place due to large volume serosanguinous output and known intra-abdominal hematoma. On the days leading to discharge, [name] was tolerating a normal diet, his pain was well controlled on oral pain medications, and he was ambulatory. He was discharged on 09/23/2018 to follow up with Transplant on an outpatient basis.       Condition at Discharge: Improved  Discharge Medications:      Medication List  START taking these medications    ??? acetaminophen 325 MG tablet; Commonly known as: TylenoL; Take 2 tablets   (650 mg total) by mouth every six (6) hours as needed for pain or fever (>   38C or 100.66F).  ??? blood sugar diagnostic Strp; Use as directed Three (3) times a day   before meals.  ??? ciprofloxacin HCl 500 MG tablet; Commonly known as: CIPRO; Take 1 tablet   (500 mg total) by mouth every twelve (12) hours for 7 days.  ??? DOK 100 MG capsule; Generic drug: docusate sodium; Take 1 capsule (100   mg total) by mouth two (2) times a day as needed for constipation.  ??? lancets 33 gauge Misc; 1 each by Miscellaneous route Three (3) times a   day before meals.  ??? magnesium (amino acid chelate) 133 mg Tab; Generic drug: magnesium   oxide-Mg AA chelate; Take 1 tablet by mouth Two (2) times a day. HOLD   until directed to start by your coordinator.  ??? mycophenolate 180 MG EC tablet; Commonly known as: MYFORTIC; Take 2   tablets (360 mg total) by mouth Two (2) times a day.  ??? pantoprazole 40 MG tablet; Commonly known as: PROTONIX; Take 1 tablet   (40 mg total) by mouth daily.  ??? polyethylene glycol 17 gram/dose powder; Commonly known as: MIRALAX;   Take 1 capful (17 g ) dissolved in 4 to 8 ounces of liquid by mouth daily   as needed (constipation).  ??? sulfamethoxazole-trimethoprim 400-80 mg per tablet; Commonly known as:   BACTRIM; Take 1 tablet (80 mg of trimethoprim total) by mouth 3 (three)   times a week.  ??? valGANciclovir 450 mg tablet; Commonly known as: VALCYTE; Take 1 tablet   (450 mg total) by mouth daily.     CHANGE how you take these medications    ??? oxyCODONE 5 MG immediate release tablet; Commonly known as: ROXICODONE;   Take 1-2 tablets (5-10 mg total) by mouth every six (6) hours as needed   for pain.; What changed: how much to take, reasons to take this     CONTINUE taking these medications    ??? azelastine 137 mcg (0.1 %) nasal spray; Commonly known as: ASTELIN  ??? EUCRISA 2 % Oint; Generic drug: crisaborole  ??? fluticasone propionate 50 mcg/actuation nasal spray; Commonly known as:   FLONASE  ??? metroNIDAZOLE 1 % gel; Commonly known as: METROGEL  ??? sertraline 25 MG tablet; Commonly known as: ZOLOFT; Take 1 tablet (25 mg   total) by mouth daily.     STOP taking these medications    ??? furosemide 20 MG tablet; Commonly known as: LASIX  ??? lactulose 10 gram/15 mL solution; Commonly known as: CHRONULAC  ??? levOCARNitine 330 mg tablet; Commonly known as: CARNITOR  ??? levothyroxine 137 MCG tablet; Commonly known as: SYNTHROID  ??? magnesium oxide 400 mg (241.3 mg magnesium) tablet; Commonly known as:   MAG-OX  ??? milk thistle 175 mg tablet  ??? ondansetron 4 MG tablet; Commonly known as: ZOFRAN  ??? spironolactone 50 MG tablet; Commonly known as: ALDACTONE  ??? ursodioL 500 MG tablet; Commonly known as: ACTIGALL  ??? XIFAXAN 550 mg Tab; Generic drug: rifAXIMin  ??? zinc gluconate 50 mg tablet       Pending Test Results:     Discharge Instructions:  Activity:     Diet:    Other Instructions:  Other Instructions     Discharge instructions      Activity:  Do not lift > 10-15 lbs for 1st 6 weeks, then gradually increase to 25 lbs over the following 6 weeks. Resume heavy lifting only after being cleared to do so at follow-up appointment in Transplant Surgery clinic.    Diet: Regular    Other Instructions: Continue to monitor and record JP drain output. Please empty the JP drains 2-4 times per day as needed.    Your Post-Transplant Coordinator is Emilio Math. Contact your transplant coordinator or the Transplant Surgery Office [phone]) during business hours or page the transplant coordinator on call [phone]) after business hours for:    - fever >100.5 degrees F by mouth, any fever with shaking chills, or other signs or symptoms of infection   - uncontrolled nausea, vomiting, or diarrhea; inability to have a bowel movement for > 3 days.   - any problem that prevents taking medications as scheduled.   - pain uncontrolled with prescribed medication or new pain or tenderness at the surgical site   - sudden weight gain or increase in blood pressure (greater than 140/85)   - shortness of breath, chest pain / discomfort   - new or increasing jaundice   - urinary symptoms including pain / difficulty / burning or tea-colored urine   - any other new or concerning symptoms   - questions regarding your medications or continuing care      Patient may shower, but should not immerse wounds in bath or pool for 2-3 weeks. Wash the surgical site with mild soap and water, but do not scrub vigorously.    You may dress wounds with dry gauze and tape to avoid soilage.    Do not drive or operate heavy machinery prior to MD clearance, or at any time while taking narcotics.    Inspect surgical sites at least twice daily, contact Transplant Coordinator for spreading redness, purulent discharge, or increasing bleeding or drainage, or for separation of wounds.     Maintain a written record of daily vital signs, per Handbook instructions.     Maintain a written record of medications taken and review against the discharge medications sheet (orange paper). Periodically review your Transplant Handbook for important information regarding postoperative care and required precautions.      Labs and Other Follow-ups after Discharge:   Liver  Labs 3x week: CBC, CMP, GGT, Mg, Phos, and Tacrolimus trough    Transplant Coordinator:  Emilio Math- phone: [phone] fax: [phone]             Labs and Other Follow-ups after Discharge:  Follow Up instructions and Outpatient Referrals     Ambulatory referral to Home Health      Is this a [place] or [place] [place] [facility] referral?: No    Physician to follow patient's care: Other: (please enter in comments)    Disciplines requested:  Nursing  Physical Therapy  Occupational Therapy       Nursing requested: Other: (please enter in comments) Comment - lab draws    Physical Therapy requested: Evaluate and treat    Occupational Therapy Requested: Evaluate and treat    Requested start of care date: Other: (please enter in comments) Comment - [facility] 09/26/18    Ambulatory referral to Home Health      Is this a [place] or [facility] referral?: Yes    Is this patient at high risk for COVID 19 transmission and recommended to stay at home during this pandemic?: No    Does your patient need 7-10 touchpoints  in the first 7 days of care including virtual visit with a provider with 36 hours? (Must be [facility] and must choose Virtual Provider to follow the patient for up to 60 days): No    Physician to follow patient's care: Referring Provider    Disciplines requested: Nursing    Nursing requested:  Teaching/skilled observation and assessment Comment - labs and drain care teaching  Other: (please enter in comments) What teaching is needed (new diagnosis? new medications?): new transplant, medications    Requested start of care date:  Comment - 09/26/2018    Discharge instructions            Future Appointments:  Appointments which have been scheduled for you    Dec 19, 2018  9:10 AM  (Arrive by 8:40 AM)  LAB ONLY with LAB PHLEB GRND UNCW  LAB PHLEB GRND FLR [employer] [facility] REGION) [address]  [place] [address]  [phone]      Dec 19, 2018  9:40 AM  (Arrive by 9:10 AM)  RETURN PHARMD with Fleeta Emmer, CPP  [facility] TRANSPLANT SURGERY [place] [facility] REGION) [address]  [place] [address]  [phone]      Dec 19, 2018 10:30 AM  (Arrive by 10:00 AM)  VIDEO VISIT- OTHER with Lanelle Bal, RD/LDN  [facility] NUTRITION SERVICES TRANSPLANT [facility] REGION) [address]  [place] [address]  [phone]      Dec 19, 2018 11:45 AM  (Arrive by 11:15 AM)  MRI ABDOMEN W WO CONTRA    -UN with [facility] MRI RM 2  IMG MRI [facility]) [address]  [place] [address]  [phone]   On appt date:  Bring recent lab work  Bring documentation of any metal object implants  Take meds as usual  Check w/physician if diabetic  You will be asked to change into a gown for your safety    On appt date do not:  Consume anything 2 hrs  Wear metallic items including jewelry (we are not responsible for lost items)    Let us know if pt:  Claustrophobic  Metal object implant  Pregnant  Prescribed a sedative  On dialysis  Allergic to MRI dye/contrast  Kidney Failure    (Title:[id number])     Dec 19, 2018 12:45 PM  (Arrive by 12:15 PM)  XR ABDOMEN 1 VIEW with Toney Reil RM 11  IMG DIAG X-RAY [facility]) [address]  [place] [address]  [phone]      Dec 19, 2018  1:45 PM  (Arrive by 1:15 PM)  RETURN 15 with Chirag Lanney Gins, MD  [facility] TRANSPLANT SURGERY [facility] REGION) [address]  [place] [address]  [phone]      Mar 20, 2019  8:30 AM  (Arrive by 8:00 AM)  VIDEO VISIT- OTHER with Adalberto Cole, PhD  [facility] TRANSPLANT SURGERY [place] [facility] REGION) [address]  [place] [address]  [phone]      Mar 20, 2019 10:20 AM  (Arrive by 9:50 AM)  RETURN PHARMD with Fleeta Emmer, CPP  [facility] TRANSPLANT SURGERY [facility] REGION) [address]  [place] [address]  [phone]      Mar 20, 2019 11:00 AM  (Arrive by 10:30 AM)  VIDEO VISIT- OTHER with Lanelle Bal,  RD/LDN  [facility] NUTRITION SERVICES TRANSPLANT [facility] REGION) [address]  [place] [address]  [phone]      Mar 20, 2019  1:45 PM  (Arrive by 1:15 PM)  RETURN 15 with Chirag Lanney Gins, MD  [facility] TRANSPLANT SURGERY [facility] REGION) [address]  [place] [address]  [phone]      Jun 19, 2019 12:45 PM  (Arrive by 12:15 PM)  RETURN 15 with Chirag Lanney Gins, MD  [facility] TRANSPLANT SURGERY [place] REGION) [address]  [place] [address]  [phone]      Jun 19, 2019  1:00 PM  (Arrive by 12:30 PM)  RETURN PHARMD with Fleeta Emmer, CPP  [facility] TRANSPLANT SURGERY [facility] REGION) [address]  [place] [address]  [phone]

## 2018-09-29 LAB — COMPREHENSIVE METABOLIC PANEL
A/G RATIO: 2.8 — ABNORMAL HIGH (ref 1.2–2.2)
ALBUMIN: 3.9 g/dL (ref 3.8–4.9)
ALT (SGPT): 53 IU/L — ABNORMAL HIGH (ref 0–44)
AST (SGOT): 20 IU/L (ref 0–40)
BILIRUBIN TOTAL: 1.3 mg/dL — ABNORMAL HIGH (ref 0.0–1.2)
BLOOD UREA NITROGEN: 20 mg/dL (ref 6–24)
BUN / CREAT RATIO: 23 — ABNORMAL HIGH (ref 9–20)
CHLORIDE: 110 mmol/L — ABNORMAL HIGH (ref 96–106)
CO2: 17 mmol/L — ABNORMAL LOW (ref 20–29)
CREATININE: 0.86 mg/dL (ref 0.76–1.27)
GLUCOSE: 114 mg/dL — ABNORMAL HIGH (ref 65–99)
POTASSIUM: 4.7 mmol/L (ref 3.5–5.2)
SODIUM: 143 mmol/L (ref 134–144)
TOTAL PROTEIN: 5.3 g/dL — ABNORMAL LOW (ref 6.0–8.5)

## 2018-09-29 LAB — CBC W/ DIFFERENTIAL
BANDED NEUTROPHILS ABSOLUTE COUNT: 0.5 10*3/uL — ABNORMAL HIGH (ref 0.0–0.1)
BASOPHILS ABSOLUTE COUNT: 0.2 10*3/uL (ref 0.0–0.2)
BASOPHILS RELATIVE PERCENT: 1 %
EOSINOPHILS ABSOLUTE COUNT: 0.6 10*3/uL — ABNORMAL HIGH (ref 0.0–0.4)
EOSINOPHILS RELATIVE PERCENT: 3 %
HEMATOCRIT: 31.6 % — ABNORMAL LOW (ref 37.5–51.0)
HEMOGLOBIN: 10.7 g/dL — ABNORMAL LOW (ref 13.0–17.7)
IMMATURE GRANULOCYTES: 3 %
LYMPHOCYTES ABSOLUTE COUNT: 2.3 10*3/uL (ref 0.7–3.1)
LYMPHOCYTES RELATIVE PERCENT: 13 %
MEAN CORPUSCULAR HEMOGLOBIN: 32.4 pg (ref 26.6–33.0)
MONOCYTES ABSOLUTE COUNT: 1 10*3/uL — ABNORMAL HIGH (ref 0.1–0.9)
MONOCYTES RELATIVE PERCENT: 6 %
NEUTROPHILS ABSOLUTE COUNT: 13.5 10*3/uL — ABNORMAL HIGH (ref 1.4–7.0)
NEUTROPHILS RELATIVE PERCENT: 74 %
PLATELET COUNT: 214 10*3/uL (ref 150–450)
RED BLOOD CELL COUNT: 3.3 x10E6/uL — ABNORMAL LOW (ref 4.14–5.80)
WHITE BLOOD CELL COUNT: 18.1 10*3/uL — ABNORMAL HIGH (ref 3.4–10.8)

## 2018-09-29 LAB — GAMMA GLUTAMYL TRANSFERASE: Gamma glutamyl transferase:CCnc:Pt:Ser/Plas:Qn:: 75 — ABNORMAL HIGH

## 2018-09-29 LAB — BILIRUBIN DIRECT: Bilirubin.glucuronidated+Bilirubin.albumin bound:MCnc:Pt:Ser/Plas:Qn:: 0.72 — ABNORMAL HIGH

## 2018-09-29 LAB — MEAN CORPUSCULAR VOLUME: Erythrocyte mean corpuscular volume:EntVol:Pt:RBC:Qn:Automated count: 96

## 2018-09-29 LAB — PHOSPHORUS, SERUM: Phosphate:MCnc:Pt:Ser/Plas:Qn:: 2.8

## 2018-09-29 LAB — ALKALINE PHOSPHATASE: Alkaline phosphatase:CCnc:Pt:Ser/Plas:Qn:: 82

## 2018-09-29 LAB — MAGNESIUM: Magnesium:MCnc:Pt:Ser/Plas:Qn:: 1.7

## 2018-09-30 LAB — HLA CL2 AB RESULT: Lab: POSITIVE

## 2018-09-30 LAB — FSAB CLASS 2 ANTIBODY SPECIFICITY

## 2018-09-30 LAB — TACROLIMUS BLOOD: Tacrolimus:MCnc:Pt:Bld:Qn:LC/MS/MS: 13.6

## 2018-09-30 LAB — ALPHA-1-ANTITRYPSIN: Alpha 1 antitrypsin:MCnc:Pt:Ser/Plas:Qn:Nephelometry: 157

## 2018-09-30 LAB — HLA CL1 ANTIBODY COMM: Lab: 0

## 2018-09-30 LAB — FSAB CLASS 1 ANTIBODY SPECIFICITY

## 2018-09-30 NOTE — Unmapped (Signed)
Patient's 8/5 tac level supratherapeutic after reducing his tac on 8/4. Reviewed with NP Martin-Velez who recommended he skip a dose this evening and restart medication tomorrow at 5mg /4mg  daily. Spoke to patient and relayed dose change. Discussed the drainage totals/character from his JP drain. He said the fluid in the bulb looks dark, but is light red in the tube. His drainage totals were on 8/5, on 8/6 and 230 ml thus far today. He said there was some drainage on the dressing at the drain site, but it was no longer saturating the dressing. Discussed with Dr.Desai, who recommended he wait another week and return to clinic on 8/17. Relayed recommendation to patient and encouraged him to reach out to coordinator if drainage increases, becomes purulent or bloodier, if wound appears reddened or if he develops a fever. He verbalized understanding. Let him know TPA would contact him re.scheduling f/u appt.

## 2018-10-01 LAB — COMPREHENSIVE METABOLIC PANEL
A/G RATIO: 2 (ref 1.2–2.2)
ALBUMIN: 3.6 g/dL — ABNORMAL LOW (ref 3.8–4.9)
ALKALINE PHOSPHATASE: 81 IU/L (ref 39–117)
ALT (SGPT): 36 IU/L (ref 0–44)
AST (SGOT): 15 IU/L (ref 0–40)
BILIRUBIN TOTAL: 1.2 mg/dL (ref 0.0–1.2)
BLOOD UREA NITROGEN: 19 mg/dL (ref 6–24)
BUN / CREAT RATIO: 19 (ref 9–20)
CALCIUM: 9 mg/dL (ref 8.7–10.2)
CO2: 18 mmol/L — ABNORMAL LOW (ref 20–29)
GLOBULIN, TOTAL: 1.8 g/dL (ref 1.5–4.5)
GLUCOSE: 95 mg/dL (ref 65–99)
POTASSIUM: 5.2 mmol/L (ref 3.5–5.2)
SODIUM: 141 mmol/L (ref 134–144)
TOTAL PROTEIN: 5.4 g/dL — ABNORMAL LOW (ref 6.0–8.5)

## 2018-10-01 LAB — CBC W/ DIFFERENTIAL
BANDED NEUTROPHILS ABSOLUTE COUNT: 0.3 10*3/uL — ABNORMAL HIGH (ref 0.0–0.1)
BASOPHILS ABSOLUTE COUNT: 0.2 10*3/uL (ref 0.0–0.2)
BASOPHILS RELATIVE PERCENT: 2 %
EOSINOPHILS RELATIVE PERCENT: 3 %
HEMATOCRIT: 30.7 % — ABNORMAL LOW (ref 37.5–51.0)
HEMOGLOBIN: 10.1 g/dL — ABNORMAL LOW (ref 13.0–17.7)
IMMATURE GRANULOCYTES: 2 %
LYMPHOCYTES RELATIVE PERCENT: 11 %
MEAN CORPUSCULAR HEMOGLOBIN CONC: 32.9 g/dL (ref 31.5–35.7)
MEAN CORPUSCULAR VOLUME: 98 fL — ABNORMAL HIGH (ref 79–97)
MONOCYTES ABSOLUTE COUNT: 0.9 10*3/uL (ref 0.1–0.9)
MONOCYTES RELATIVE PERCENT: 6 %
NEUTROPHILS ABSOLUTE COUNT: 11.9 10*3/uL — ABNORMAL HIGH (ref 1.4–7.0)
NEUTROPHILS RELATIVE PERCENT: 76 %
PLATELET COUNT: 204 10*3/uL (ref 150–450)
RED BLOOD CELL COUNT: 3.15 x10E6/uL — ABNORMAL LOW (ref 4.14–5.80)
RED CELL DISTRIBUTION WIDTH: 15.7 % — ABNORMAL HIGH (ref 11.6–15.4)
WHITE BLOOD CELL COUNT: 15.4 10*3/uL — ABNORMAL HIGH (ref 3.4–10.8)

## 2018-10-01 LAB — BILIRUBIN DIRECT: Bilirubin.glucuronidated+Bilirubin.albumin bound:MCnc:Pt:Ser/Plas:Qn:: 0.67 — ABNORMAL HIGH

## 2018-10-01 LAB — MAGNESIUM: Magnesium:MCnc:Pt:Ser/Plas:Qn:: 1.6

## 2018-10-01 LAB — AST (SGOT): Aspartate aminotransferase:CCnc:Pt:Ser/Plas:Qn:: 15

## 2018-10-01 LAB — HEMATOCRIT: Hematocrit:VFr:Pt:Bld:Qn:Automated count: 30.7 — ABNORMAL LOW

## 2018-10-01 LAB — PHOSPHORUS, SERUM: Phosphate:MCnc:Pt:Ser/Plas:Qn:: 3.5

## 2018-10-01 LAB — GAMMA GLUTAMYL TRANSFERASE: Gamma glutamyl transferase:CCnc:Pt:Ser/Plas:Qn:: 61

## 2018-10-01 MED ORDER — TACROLIMUS 1 MG CAPSULE
ORAL_CAPSULE | 11 refills | 0 days | Status: CP
Start: 2018-10-01 — End: 2018-10-05

## 2018-10-03 LAB — TACROLIMUS BLOOD: Tacrolimus:MCnc:Pt:Bld:Qn:LC/MS/MS: 14.1

## 2018-10-03 NOTE — Unmapped (Signed)
Change in Tacrolimus dosage decrease. Refill too soon until 10/15/2018. Will attempt test claim on 10/17/2018 date.

## 2018-10-03 NOTE — Unmapped (Addendum)
Patient called to discuss the character of his JP drainage, describing having some tissue-like neutral-colored strands in the JP drainage. He said there is no odor to the drainage, and it appears lighter in color, but the quantity has remained around daily. He denies fever/chills, but says he thinks his abdomen is slightly more distended (now LE swelling) than it was previously since txp and he reports having an aching sensation in his lower abdomen during a bm and burning following urination. Discussed with txp pharmacist and NP Martin-Velez, who postponed any intervention until patient's labs result from today.Pt aware.

## 2018-10-04 LAB — COMPREHENSIVE METABOLIC PANEL
A/G RATIO: 2.6 — ABNORMAL HIGH (ref 1.2–2.2)
ALBUMIN: 3.7 g/dL — ABNORMAL LOW (ref 3.8–4.9)
ALKALINE PHOSPHATASE: 87 IU/L (ref 39–117)
ALT (SGPT): 27 IU/L (ref 0–44)
BILIRUBIN TOTAL: 1.1 mg/dL (ref 0.0–1.2)
BLOOD UREA NITROGEN: 15 mg/dL (ref 6–24)
CALCIUM: 8.5 mg/dL — ABNORMAL LOW (ref 8.7–10.2)
CHLORIDE: 109 mmol/L — ABNORMAL HIGH (ref 96–106)
CO2: 19 mmol/L — ABNORMAL LOW (ref 20–29)
CREATININE: 0.81 mg/dL (ref 0.76–1.27)
GLOBULIN, TOTAL: 1.4 g/dL — ABNORMAL LOW (ref 1.5–4.5)
GLUCOSE: 90 mg/dL (ref 65–99)
SODIUM: 142 mmol/L (ref 134–144)
TOTAL PROTEIN: 5.1 g/dL — ABNORMAL LOW (ref 6.0–8.5)

## 2018-10-04 LAB — CBC W/ DIFFERENTIAL
BASOPHILS ABSOLUTE COUNT: 0.2 10*3/uL (ref 0.0–0.2)
BASOPHILS RELATIVE PERCENT: 2 %
EOSINOPHILS ABSOLUTE COUNT: 0.6 10*3/uL — ABNORMAL HIGH (ref 0.0–0.4)
HEMATOCRIT: 29.8 % — ABNORMAL LOW (ref 37.5–51.0)
HEMOGLOBIN: 9.7 g/dL — ABNORMAL LOW (ref 13.0–17.7)
IMMATURE GRANULOCYTES: 1 %
LYMPHOCYTES ABSOLUTE COUNT: 1.5 10*3/uL (ref 0.7–3.1)
LYMPHOCYTES RELATIVE PERCENT: 15 %
MEAN CORPUSCULAR HEMOGLOBIN CONC: 32.6 g/dL (ref 31.5–35.7)
MEAN CORPUSCULAR HEMOGLOBIN: 31.5 pg (ref 26.6–33.0)
MEAN CORPUSCULAR VOLUME: 97 fL (ref 79–97)
MONOCYTES ABSOLUTE COUNT: 0.7 10*3/uL (ref 0.1–0.9)
MONOCYTES RELATIVE PERCENT: 7 %
NEUTROPHILS ABSOLUTE COUNT: 7 10*3/uL (ref 1.4–7.0)
NEUTROPHILS RELATIVE PERCENT: 69 %
PLATELET COUNT: 210 10*3/uL (ref 150–450)
RED BLOOD CELL COUNT: 3.08 x10E6/uL — ABNORMAL LOW (ref 4.14–5.80)
RED CELL DISTRIBUTION WIDTH: 15.1 % (ref 11.6–15.4)
WHITE BLOOD CELL COUNT: 10.1 10*3/uL (ref 3.4–10.8)

## 2018-10-04 LAB — MAGNESIUM: Magnesium:MCnc:Pt:Ser/Plas:Qn:: 1.6

## 2018-10-04 LAB — GAMMA GLUTAMYL TRANSFERASE: Gamma glutamyl transferase:CCnc:Pt:Ser/Plas:Qn:: 61

## 2018-10-04 LAB — BILIRUBIN DIRECT: Bilirubin.glucuronidated+Bilirubin.albumin bound:MCnc:Pt:Ser/Plas:Qn:: 0.55 — ABNORMAL HIGH

## 2018-10-04 LAB — PHOSPHORUS, SERUM: Phosphate:MCnc:Pt:Ser/Plas:Qn:: 3.3

## 2018-10-04 LAB — CALCIUM: Calcium:MCnc:Pt:Ser/Plas:Qn:: 8.5 — ABNORMAL LOW

## 2018-10-04 LAB — NEUTROPHILS RELATIVE PERCENT: Neutrophils/100 leukocytes:NFr:Pt:Bld:Qn:Automated count: 69

## 2018-10-05 ENCOUNTER — Telehealth: Payer: Self-pay

## 2018-10-05 LAB — TACROLIMUS BLOOD: Tacrolimus:MCnc:Pt:Bld:Qn:LC/MS/MS: 20.4

## 2018-10-05 NOTE — Unmapped (Signed)
Pt called this provider for clarification on some food safety questions. Pt had many questions re: meats, cooking temperatures and hard boiled eggs. Questions were answered and pt was grateful for the information.     Lanelle Bal, RD, LDN  Abdominal Transplant Dietitian   Pager: [phone]

## 2018-10-05 NOTE — Unmapped (Addendum)
Patient's 8/10 tac resulted today at 20.4. Patient's dose was adjusted for Monday evening after his Friday result of 14.1. Discussed with NP Martin-Velez who recommended he skip his dose tonight and tomorrow morning and begin taking 3mg  bid tomorrow night. She also recommended that the patient try to reduced the number of times he empties his JP bulb.    Contacted patient who confirmed he was taking the correct doses as directed two days ago and that the trough was reliable. He also said he had not consumed any grapefruit or pomegranate products which could potentiate the serum level.Marland KitchenHe did endorse a mild HA and increased hand tremors. Relayed new directions for dose change, beginning tomorrow night after he skips the next two dosing times.    We also discussed his JP drainage. He reported volumes for emptying it at least 6-8 times daily, totalling today.  He said the drainage was getting much lighter as well. Encouraged him to reduce the number of emptying times to 2-3 x daily, per NP's recommendation. Encouraged him to contact coordinator with any  Changes in drainage amt or character. He verbalized understanding of all discussed.

## 2018-10-05 NOTE — Unmapped (Addendum)
error 

## 2018-10-05 NOTE — Telephone Encounter (Signed)
Noted. Thanks.

## 2018-10-05 NOTE — Telephone Encounter (Signed)
Spoke with patient, he is doing very well and following up closely with his specialists at [facility].  He would like to wait and see Dr. Damita Dunnings as planned at his 10/28/18 appointment but knows he will call if he needs anything sooner.   Patient does not qualify for TCM at this point as out of visit range.    FYI to Dr. Damita Dunnings, he thanks [place] for the check in call.   No questions or concerns at this time.

## 2018-10-06 LAB — CBC W/ DIFFERENTIAL
BANDED NEUTROPHILS ABSOLUTE COUNT: 0.1 10*3/uL (ref 0.0–0.1)
BASOPHILS ABSOLUTE COUNT: 0.2 10*3/uL (ref 0.0–0.2)
BASOPHILS RELATIVE PERCENT: 2 %
EOSINOPHILS ABSOLUTE COUNT: 0.4 10*3/uL (ref 0.0–0.4)
EOSINOPHILS RELATIVE PERCENT: 4 %
HEMOGLOBIN: 9.9 g/dL — ABNORMAL LOW (ref 13.0–17.7)
IMMATURE GRANULOCYTES: 1 %
LYMPHOCYTES ABSOLUTE COUNT: 1.5 10*3/uL (ref 0.7–3.1)
LYMPHOCYTES RELATIVE PERCENT: 18 %
MEAN CORPUSCULAR HEMOGLOBIN CONC: 31.6 g/dL (ref 31.5–35.7)
MEAN CORPUSCULAR HEMOGLOBIN: 30.7 pg (ref 26.6–33.0)
MONOCYTES ABSOLUTE COUNT: 0.5 10*3/uL (ref 0.1–0.9)
MONOCYTES RELATIVE PERCENT: 6 %
NEUTROPHILS ABSOLUTE COUNT: 5.6 10*3/uL (ref 1.4–7.0)
NEUTROPHILS RELATIVE PERCENT: 69 %
PLATELET COUNT: 246 10*3/uL (ref 150–450)
RED BLOOD CELL COUNT: 3.22 x10E6/uL — ABNORMAL LOW (ref 4.14–5.80)
WHITE BLOOD CELL COUNT: 8.3 10*3/uL (ref 3.4–10.8)

## 2018-10-06 LAB — MICROSCOPIC EXAMINATION
BACTERIA: NONE SEEN
CASTS: NONE SEEN /LPF

## 2018-10-06 LAB — COMPREHENSIVE METABOLIC PANEL
A/G RATIO: 1.9 (ref 1.2–2.2)
ALBUMIN: 3.4 g/dL — ABNORMAL LOW (ref 3.8–4.9)
ALKALINE PHOSPHATASE: 96 IU/L (ref 39–117)
ALT (SGPT): 23 IU/L (ref 0–44)
AST (SGOT): 17 IU/L (ref 0–40)
BILIRUBIN TOTAL: 0.9 mg/dL (ref 0.0–1.2)
BLOOD UREA NITROGEN: 16 mg/dL (ref 6–24)
CALCIUM: 8.4 mg/dL — ABNORMAL LOW (ref 8.7–10.2)
CO2: 20 mmol/L (ref 20–29)
CREATININE: 0.85 mg/dL (ref 0.76–1.27)
GLOBULIN, TOTAL: 1.8 g/dL (ref 1.5–4.5)
POTASSIUM: 4.9 mmol/L (ref 3.5–5.2)
SODIUM: 141 mmol/L (ref 134–144)
TOTAL PROTEIN: 5.2 g/dL — ABNORMAL LOW (ref 6.0–8.5)

## 2018-10-06 LAB — GAMMA GLUTAMYL TRANSFERASE: Gamma glutamyl transferase:CCnc:Pt:Ser/Plas:Qn:: 63

## 2018-10-06 LAB — URINALYSIS WITH CULTURE REFLEX
BILIRUBIN UA: NEGATIVE
BLOOD UA: NEGATIVE
KETONES UA: NEGATIVE
LEUKOCYTE ESTERASE UA: NEGATIVE
NITRITE UA: NEGATIVE
PH UA: 5.5 (ref 5.0–7.5)
PROTEIN UA: NEGATIVE
SPECIFIC GRAVITY UA: 1.016 (ref 1.005–1.030)
UROBILINOGEN UA: 0.2 mg/dL (ref 0.2–1.0)

## 2018-10-06 LAB — RBC URINE

## 2018-10-06 LAB — BILIRUBIN DIRECT: Bilirubin.glucuronidated+Bilirubin.albumin bound:MCnc:Pt:Ser/Plas:Qn:: 0.48 — ABNORMAL HIGH

## 2018-10-06 LAB — PHOSPHORUS, SERUM: Phosphate:MCnc:Pt:Ser/Plas:Qn:: 3.4

## 2018-10-06 LAB — SODIUM: Sodium:SCnc:Pt:Ser/Plas:Qn:: 141

## 2018-10-06 LAB — RED BLOOD CELL COUNT: Erythrocytes:NCnc:Pt:Bld:Qn:Automated count: 3.22 — ABNORMAL LOW

## 2018-10-06 LAB — MAGNESIUM: Magnesium:MCnc:Pt:Ser/Plas:Qn:: 1.6

## 2018-10-06 LAB — PH UA: pH:LsCnc:Pt:Urine:Qn:Test strip: 5.5

## 2018-10-06 MED ORDER — TRADJENTA 5 MG TABLET
ORAL_TABLET | Freq: Every day | ORAL | 5 refills | 30.00000 days | Status: CP
Start: 2018-10-06 — End: 2018-10-10

## 2018-10-06 MED ORDER — TACROLIMUS 1 MG CAPSULE
ORAL_CAPSULE | Freq: Two times a day (BID) | ORAL | 11 refills | 30 days | Status: CP
Start: 2018-10-06 — End: 2018-10-10

## 2018-10-06 MED ORDER — INSULIN GLARGINE (U-100) 100 UNIT/ML SUBCUTANEOUS SOLUTION
3 refills | 0 days | Status: CP
Start: 2018-10-06 — End: 2018-10-10

## 2018-10-06 NOTE — Unmapped (Signed)
Pharmacy Phone Follow-Up  Date: 10/06/2018      Reason for call: BG follow up     Plan:  1. Start Lantus 5 units HS (already has it at home)  2. See if insurance covers Tradjenta 5 mg.  Avoiding metformin 2/2 diarrhea.  If he can take Tradjenta, would stop Lantus and replace with Tradjenta    Follow up: 1 week     Current regimen: none  A1C: 4.4% on 09/15/18    Subjective: feels fine. Has Lantus at home.    Home BG log:    Breakfast Lunch  Dinner    Date [facility]   09/30/2018 114  225  273    10/01/2018 127  128  169    10/02/2018 110  223  347    10/03/2018 121  241  237    10/04/2018 125    225    10/05/2018 125  170  250    10/06/2018 113  248          Fleeta Emmer, PharmD, CPP  Pager [phone]

## 2018-10-06 NOTE — Unmapped (Signed)
Patient contacted coordinator to report that he had forgotten to take his myfortic dose last night. His Tac dose was held d/t high level. Let him know as long as he remembers to take the med within the 6 hr window, it would be alright. He verbalized understanding and said he took it on time this morning. Discussed safety of his going to a barber for a hair cut. Let him know that the biggest concern would be covid infection, but he could go if he wore a mask. Explained that by 3 mos his IS doses would be decreased, which should be a safer time in terms of his personal infection risk. He verbalized choice to wait. Patient inquired about his f/u appt for next week. Sent  msg again to TPA to schedule patient to see Dr.Desai on Monday and notify patient of time.

## 2018-10-07 LAB — TACROLIMUS BLOOD: Tacrolimus:MCnc:Pt:Bld:Qn:LC/MS/MS: 9.8

## 2018-10-07 NOTE — Unmapped (Signed)
Patient's explant reviewed in pathology conference on 8/12 in presence of Drs. Theophilus Bones, and NP Martin-Velez. Most viable cancer found within segment 6, a previously ablated area. No vascular, lymph or bile duct invasion noted, however. High grade dysplastic nodule with cirrhotic changes identified in segment 5. MRI f/u per [facility] protocol.

## 2018-10-07 NOTE — Unmapped (Addendum)
Patient contacted coordinator to say he feels like he has some occasional SOB when he's sitting or starts to fall asleep if his cpap is not on. He said he is unsure if this is b/c of the muscles that were cut during his surgery, but he did say he had a lung bx just before his txp. He does not have dyspnea or SOB with activity and denies sense of air hunger. He hs no c/o cough, fever, or chest pain. Patient will be seen in clinic on Monday.Discussed with NP Martin-Velez who recommended he follow a low salt, high protein diet over the weekend in case he is retaining fluid.  Shared information with patient and encouraged him to contact on-call coordinator if SOB worsens over the weekend. He verbalized understanding.

## 2018-10-07 NOTE — Unmapped (Signed)
Called pt and relayed details re 8/17 lab and clinic appts. Told pt this TPA will be doing appt scheduling for him, and he verbalized understanding of all discussed.

## 2018-10-08 LAB — CBC W/ DIFFERENTIAL
BANDED NEUTROPHILS ABSOLUTE COUNT: 0.1 10*3/uL (ref 0.0–0.1)
BASOPHILS ABSOLUTE COUNT: 0.2 10*3/uL (ref 0.0–0.2)
BASOPHILS RELATIVE PERCENT: 2 %
EOSINOPHILS ABSOLUTE COUNT: 0.4 10*3/uL (ref 0.0–0.4)
EOSINOPHILS RELATIVE PERCENT: 6 %
HEMATOCRIT: 29.7 % — ABNORMAL LOW (ref 37.5–51.0)
HEMOGLOBIN: 9.9 g/dL — ABNORMAL LOW (ref 13.0–17.7)
LYMPHOCYTES ABSOLUTE COUNT: 1.3 10*3/uL (ref 0.7–3.1)
LYMPHOCYTES RELATIVE PERCENT: 18 %
MEAN CORPUSCULAR HEMOGLOBIN CONC: 33.3 g/dL (ref 31.5–35.7)
MEAN CORPUSCULAR HEMOGLOBIN: 31.2 pg (ref 26.6–33.0)
MEAN CORPUSCULAR VOLUME: 94 fL (ref 79–97)
MONOCYTES ABSOLUTE COUNT: 0.5 10*3/uL (ref 0.1–0.9)
MONOCYTES RELATIVE PERCENT: 7 %
NEUTROPHILS ABSOLUTE COUNT: 5 10*3/uL (ref 1.4–7.0)
PLATELET COUNT: 274 10*3/uL (ref 150–450)
RED BLOOD CELL COUNT: 3.17 x10E6/uL — ABNORMAL LOW (ref 4.14–5.80)
WHITE BLOOD CELL COUNT: 7.6 10*3/uL (ref 3.4–10.8)

## 2018-10-08 LAB — COMPREHENSIVE METABOLIC PANEL
A/G RATIO: 1.8 (ref 1.2–2.2)
ALBUMIN: 3.4 g/dL — ABNORMAL LOW (ref 3.8–4.9)
ALKALINE PHOSPHATASE: 97 IU/L (ref 39–117)
ALT (SGPT): 20 IU/L (ref 0–44)
AST (SGOT): 10 IU/L (ref 0–40)
BILIRUBIN TOTAL: 0.8 mg/dL (ref 0.0–1.2)
BLOOD UREA NITROGEN: 15 mg/dL (ref 6–24)
CALCIUM: 8.9 mg/dL (ref 8.7–10.2)
CREATININE: 0.86 mg/dL (ref 0.76–1.27)
GLOBULIN, TOTAL: 1.9 g/dL (ref 1.5–4.5)
GLUCOSE: 112 mg/dL — ABNORMAL HIGH (ref 65–99)
POTASSIUM: 4.9 mmol/L (ref 3.5–5.2)
SODIUM: 142 mmol/L (ref 134–144)
TOTAL PROTEIN: 5.3 g/dL — ABNORMAL LOW (ref 6.0–8.5)

## 2018-10-08 LAB — BILIRUBIN DIRECT: Bilirubin.glucuronidated+Bilirubin.albumin bound:MCnc:Pt:Ser/Plas:Qn:: 0.43 — ABNORMAL HIGH

## 2018-10-08 LAB — HEMATOCRIT: Hematocrit:VFr:Pt:Bld:Qn:Automated count: 29.7 — ABNORMAL LOW

## 2018-10-08 LAB — ALBUMIN: Albumin:MCnc:Pt:Ser/Plas:Qn:: 3.4 — ABNORMAL LOW

## 2018-10-08 LAB — MAGNESIUM: Magnesium:MCnc:Pt:Ser/Plas:Qn:: 1.6

## 2018-10-08 LAB — PHOSPHORUS, SERUM: Phosphate:MCnc:Pt:Ser/Plas:Qn:: 3.8

## 2018-10-08 LAB — GAMMA GLUTAMYL TRANSFERASE: Gamma glutamyl transferase:CCnc:Pt:Ser/Plas:Qn:: 58

## 2018-10-08 NOTE — Unmapped (Signed)
Received page from patient saying he thinks his SOB is slightly worse than yesterday.   Read the note from pt's coordinator from yesterday and that she spoke with provider.   Patient mentioned at times this morning hard to get a breath in.   Patient was talking and not gasping while on the phone.   Mentioned if he was having more SOB and worse that he would need to go to the ED.   Talked with patient and his wife for >25 minutes. Patient mentioned his stomach was tight and said that might be what was causing him to be SOB. Denies constipation and having normal BMs and said he needed to have one soon.   Mentioned SOB can lead to anxiety causing abdominal muscles to tighten and asked if he felt this might be reason for stomach tightening and he was unsure.   Pt's wife confirmed that he is not labored breathing and not gasping.   Denied being exposed to anyone with COVID.     Patient did not want to go to ED yet and wanted to try to eat food saying he would eat protein as mentioned yesterday of less salt and eating protein to prevent fluid retention.     Both patient and wife verbalized understanding that if the SOB increases in frequency or worsens that he will need to go to the ED.

## 2018-10-09 LAB — TACROLIMUS BLOOD: Tacrolimus:MCnc:Pt:Bld:Qn:LC/MS/MS: 5.5

## 2018-10-10 ENCOUNTER — Encounter: Admit: 2018-10-10 | Discharge: 2018-10-11 | Payer: MEDICARE

## 2018-10-10 ENCOUNTER — Ambulatory Visit: Admit: 2018-10-10 | Discharge: 2018-10-11 | Payer: MEDICARE | Attending: Surgery | Primary: Surgery

## 2018-10-10 DIAGNOSIS — R0602 Shortness of breath: Principal | ICD-10-CM

## 2018-10-10 DIAGNOSIS — E612 Magnesium deficiency: Secondary | ICD-10-CM

## 2018-10-10 DIAGNOSIS — Z944 Liver transplant status: Principal | ICD-10-CM

## 2018-10-10 DIAGNOSIS — Z5181 Encounter for therapeutic drug level monitoring: Secondary | ICD-10-CM

## 2018-10-10 DIAGNOSIS — Z79899 Other long term (current) drug therapy: Secondary | ICD-10-CM

## 2018-10-10 DIAGNOSIS — R739 Hyperglycemia, unspecified: Secondary | ICD-10-CM

## 2018-10-10 DIAGNOSIS — Z9189 Other specified personal risk factors, not elsewhere classified: Secondary | ICD-10-CM

## 2018-10-10 DIAGNOSIS — R251 Tremor, unspecified: Secondary | ICD-10-CM | POA: Diagnosis not present

## 2018-10-10 DIAGNOSIS — K7460 Unspecified cirrhosis of liver: Secondary | ICD-10-CM | POA: Diagnosis not present

## 2018-10-10 DIAGNOSIS — E8770 Fluid overload, unspecified: Secondary | ICD-10-CM | POA: Diagnosis not present

## 2018-10-10 DIAGNOSIS — K7469 Other cirrhosis of liver: Secondary | ICD-10-CM | POA: Diagnosis not present

## 2018-10-10 DIAGNOSIS — C220 Liver cell carcinoma: Secondary | ICD-10-CM | POA: Diagnosis not present

## 2018-10-10 DIAGNOSIS — J90 Pleural effusion, not elsewhere classified: Secondary | ICD-10-CM | POA: Diagnosis not present

## 2018-10-10 DIAGNOSIS — R51 Headache: Secondary | ICD-10-CM | POA: Diagnosis not present

## 2018-10-10 DIAGNOSIS — R918 Other nonspecific abnormal finding of lung field: Secondary | ICD-10-CM | POA: Diagnosis not present

## 2018-10-10 LAB — COMPREHENSIVE METABOLIC PANEL
ALBUMIN: 3.4 g/dL — ABNORMAL LOW (ref 3.5–5.0)
ALKALINE PHOSPHATASE: 87 U/L (ref 38–126)
ALT (SGPT): 18 U/L (ref <50)
ANION GAP: 9 mmol/L (ref 7–15)
BILIRUBIN TOTAL: 0.8 mg/dL (ref 0.0–1.2)
BLOOD UREA NITROGEN: 22 mg/dL — ABNORMAL HIGH (ref 7–21)
BUN / CREAT RATIO: 30
CHLORIDE: 103 mmol/L (ref 98–107)
CO2: 27 mmol/L (ref 22.0–30.0)
CREATININE: 0.74 mg/dL (ref 0.70–1.30)
EGFR CKD-EPI AA MALE: >90 mL/min/{1.73_m2} (ref >=60)
EGFR CKD-EPI NON-AA MALE: >90 mL/min/{1.73_m2} (ref >=60)
GLUCOSE RANDOM: 99 mg/dL (ref 70–179)
POTASSIUM: 4.4 mmol/L (ref 3.5–5.0)
SODIUM: 139 mmol/L (ref 135–145)

## 2018-10-10 LAB — CBC W/ AUTO DIFF
BASOPHILS RELATIVE PERCENT: 0.9 %
EOSINOPHILS ABSOLUTE COUNT: 0.5 10*9/L — ABNORMAL HIGH (ref 0.0–0.4)
EOSINOPHILS RELATIVE PERCENT: 6.2 %
HEMATOCRIT: 34.8 % — ABNORMAL LOW (ref 41.0–53.0)
HEMOGLOBIN: 11.2 g/dL — ABNORMAL LOW (ref 13.5–17.5)
LARGE UNSTAINED CELLS: 1 % (ref 0–4)
LYMPHOCYTES ABSOLUTE COUNT: 1.4 10*9/L — ABNORMAL LOW (ref 1.5–5.0)
LYMPHOCYTES RELATIVE PERCENT: 17.2 %
MEAN CORPUSCULAR HEMOGLOBIN: 31 pg (ref 26.0–34.0)
MEAN CORPUSCULAR VOLUME: 96.5 fL (ref 80.0–100.0)
MONOCYTES ABSOLUTE COUNT: 0.4 10*9/L (ref 0.2–0.8)
MONOCYTES RELATIVE PERCENT: 5.2 %
NEUTROPHILS ABSOLUTE COUNT: 5.8 10*9/L (ref 2.0–7.5)
NEUTROPHILS RELATIVE PERCENT: 69.5 %
PLATELET COUNT: 359 10*9/L (ref 150–440)
RED BLOOD CELL COUNT: 3.61 10*12/L — ABNORMAL LOW (ref 4.50–5.90)
RED CELL DISTRIBUTION WIDTH: 17.4 % — ABNORMAL HIGH (ref 12.0–15.0)

## 2018-10-10 LAB — MEAN PLATELET VOLUME: Lab: 8.1

## 2018-10-10 LAB — PHOSPHORUS: Phosphate:MCnc:Pt:Ser/Plas:Qn:: 3.9

## 2018-10-10 LAB — CALCIUM: Calcium:MCnc:Pt:Ser/Plas:Qn:: 8.8

## 2018-10-10 LAB — BILIRUBIN DIRECT: Bilirubin.glucuronidated+Bilirubin.albumin bound:MCnc:Pt:Ser/Plas:Qn:: 0.2

## 2018-10-10 LAB — GAMMA GLUTAMYL TRANSFERASE: Gamma glutamyl transferase:CCnc:Pt:Ser/Plas:Qn:: 63

## 2018-10-10 LAB — TACROLIMUS BLOOD: Lab: 6.3

## 2018-10-10 LAB — MAGNESIUM: Magnesium:MCnc:Pt:Ser/Plas:Qn:: 1.6

## 2018-10-10 LAB — SMEAR REVIEW

## 2018-10-10 MED ORDER — FUROSEMIDE 20 MG TABLET
ORAL_TABLET | Freq: Every day | ORAL | 1 refills | 30 days | Status: CP
Start: 2018-10-10 — End: 2018-10-13

## 2018-10-10 MED ORDER — GLIPIZIDE ER 5 MG TABLET, EXTENDED RELEASE 24 HR
ORAL_TABLET | Freq: Every day | ORAL | 11 refills | 30 days | Status: CP
Start: 2018-10-10 — End: 2018-10-24

## 2018-10-10 MED ORDER — TACROLIMUS 1 MG CAPSULE
ORAL_CAPSULE | 11 refills | 0 days | Status: CP
Start: 2018-10-10 — End: 2018-10-17

## 2018-10-10 MED ORDER — PREDNISONE 5 MG TABLET
ORAL_TABLET | Freq: Every day | ORAL | 11 refills | 60 days
Start: 2018-10-10 — End: 2018-10-17

## 2018-10-10 MED ORDER — PANTOPRAZOLE 40 MG TABLET,DELAYED RELEASE
ORAL_TABLET | Freq: Every day | ORAL | 11 refills | 30 days | PRN
Start: 2018-10-10 — End: 2019-10-10

## 2018-10-10 NOTE — Unmapped (Signed)
[facility] PHARMACY NOTE  10/10/2018   Anthony Alvarado  [id number]    Medication changes today:   1. Decrease prednisone to 2.5 mg daily x7 days then stop  2. Stop Lantus  3. Start glipizide XL 5 mg daily  4. Start ergocalciferol 50,000 units weekly x8 weeks then transition to cholecalciferol 2,000 units daily  5. Start TUMS 1500 mg BID  6. Start furosemide 20 mg daily  7. Increase tacrolimus to 4 mg qAM and 3 mg qPM    Education/Adherence tools provided today:  1.provided updated medication list  2. provided additional pill box education  3.  provided additional education on immunosuppression and transplant related medications including reviewing indications of medications, dosing and side effects    Follow up items:  1. goal of understanding indications and dosing of immunosuppression medications  2. Consider starting statin at a later date  3. BG and if need to increase glipizide  4. Consider starting alendronate at next visit for osteoporosis of spine    Next visit with pharmacy in 1 month  ____________________________________________________________________    Anthony Alvarado is a 58 y.o. male s/p orthotopic liver transplant on 09/16/2018 (Liver) 2/2 cryptogenic cirrhosis w/HCC.    Other PMH significant for HTN, T2DM, OSA, aortic stenosis    Seen by pharmacy today for: medication management and pill box fill and adherence education; last seen by pharmacy 2 weeks ago     CC:  Patient complains of SOB    There were no vitals filed for this visit.    Allergies   Allergen Reactions   ??? Watermelon Flavor      Mouth itching       All medications reviewed and updated.     Medication list includes revisions made during today???s encounter    Outpatient Encounter Medications as of 10/10/2018   Medication Sig Dispense Refill   ??? acetaminophen (TYLENOL) 325 MG tablet Take 2 tablets (650 mg total) by mouth every six (6) hours as needed for pain or fever (> 38C or 100.11F). 100 tablet 0   ??? aspirin (ECOTRIN) 81 MG tablet Take 1 tablet (81 mg total) by mouth daily. 30 tablet 11   ??? azelastine (ASTELIN) 137 mcg (0.1 %) nasal spray 1 spray by Each Nare route daily as needed.      ??? blood sugar diagnostic Strp Use as directed Three (3) times a day before meals. 90 each 11   ??? [START ON 11/23/2018] cholecalciferol, vitamin D3, 1,000 unit (25 mcg) tablet Take 2 tablets (2,000 Units total) by mouth daily. 60 tablet 11   ??? [EXPIRED] ciprofloxacin HCl (CIPRO) 500 MG tablet Take 1 tablet (500 mg total) by mouth every twelve (12) hours for 7 days. 14 tablet 0   ??? docusate sodium (COLACE) 100 MG capsule Take 1 capsule (100 mg total) by mouth two (2) times a day as needed for constipation. (Patient not taking: Reported on 09/24/2018) 30 capsule 2   ??? ergocalciferol (DRISDOL) 1,250 mcg (50,000 unit) capsule Take 1 capsule (50,000 Units total) by mouth once a week for 8 doses. 4 capsule 1   ??? EUCRISA 2 % Oint Apply 1 application topically daily as needed.      ??? fluticasone propionate (FLONASE) 50 mcg/actuation nasal spray PLACE ONE OR TWO SPRAYS INTO BOTH NOSTRILS DAILY AS NEEDED.     ??? lancets 33 gauge Misc 1 each by Miscellaneous route Three (3) times a day before meals. 100 each 11   ??? levothyroxine (  SYNTHROID) 150 MCG tablet Take 1 tablet (150 mcg total) by mouth daily. 30 tablet 11   ??? linaGLIPtin (TRADJENTA) 5 mg Tab Take 1 tablet (5 mg total) by mouth daily. 30 tablet 5   ??? magnesium oxide-Mg AA chelate (MAGNESIUM, AMINO ACID CHELATE,) 133 mg Tab Take 1 tablet by mouth Two (2) times a day. HOLD until directed to start by your coordinator. (Patient not taking: Reported on 09/24/2018) 60 tablet 11   ??? metroNIDAZOLE (METROGEL) 1 % gel Apply 1 application topically daily as needed.      ??? mycophenolate (MYFORTIC) 180 MG EC tablet Take 2 tablets (360 mg total) by mouth Two (2) times a day. 120 tablet 11   ??? oxyCODONE (ROXICODONE) 5 MG immediate release tablet Take 1-2 tablets (5-10 mg total) by mouth every six (6) hours as needed for pain. 40 tablet 0   ??? pantoprazole (PROTONIX) 40 MG tablet Take 1 tablet (40 mg total) by mouth daily. 30 tablet 11   ??? polyethylene glycol (MIRALAX) 17 gram/dose powder Take 1 capful (17 g ) dissolved in 4 to 8 ounces of liquid by mouth daily as needed (constipation). (Patient not taking: Reported on 09/24/2018) 238 g 0   ??? predniSONE (DELTASONE) 5 MG tablet Take 1 tablet (5 mg total) by mouth daily. 30 tablet 11   ??? sertraline (ZOLOFT) 25 MG tablet Take 1 tablet (25 mg total) by mouth daily. 30 tablet 11   ??? sulfamethoxazole-trimethoprim (BACTRIM) 400-80 mg per tablet Take 1 tablet (80 mg of trimethoprim total) by mouth 3 (three) times a week. 12 tablet 5   ??? tacrolimus (PROGRAF) 1 MG capsule Take 3 capsules (3 mg total) by mouth two (2) times a day. 180 capsule 11   ??? valGANciclovir (VALCYTE) 450 mg tablet Take 1 tablet (450 mg total) by mouth daily. 30 tablet 2     Facility-Administered Encounter Medications as of 10/10/2018   Medication Dose Route Frequency Provider Last Rate Last Dose   ??? diazePAM (VALIUM) 5 mg/mL injection                Induction agent : basiliximab    CURRENT IMMUNOSUPPRESSION: tacrolimus 3 mg PO bid  prograf/Envarsus/cyclosporine goal: 8-10   [id number]  mg PO bid    prednisone 5 mg daily.  Left at 5 mg until explant biopsy can be reviewed at path conference    Patient complains of temperature sensitivity    IMMUNOSUPPRESSION DRUG LEVELS:  Lab Results   Component Value Date    Tacrolimus, Trough 6.0 09/23/2018    Tacrolimus, Trough 4.4 (L) 09/22/2018    Tacrolimus, Trough 4.6 (L) 09/21/2018    Tacrolimus Lvl 5.5 10/07/2018    Tacrolimus Lvl 9.8 10/05/2018    Tacrolimus Lvl 20.4 (HH) 10/03/2018     No results found for: CYCLO  No results found for: EVEROLIMUS  No results found for: SIROLIMUS    Prograf level is accurate 12 hour trough    Graft function: todays labs slightly elevated compared to 3 days ago  DSA: ntd   Explant biopsy: well differentiated HCC with no vascular invasion, possibility of alpha-1 antitrypsin deficiency  Biopsies to date: ntd  WBC/ANC:  8.3/5.8    Plan: Will increase tacrolimus to 4 mg qAM and 3 mg qPM. Continue to monitor.    OI Prophylaxis:   CMV Status: D-/ R+, moderate risk. CMV prophylaxis: valganciclovir 450 mg daily x 3 months per protocol.  No results found for: CMVCP  PCP Prophylaxis: bactrim  SS 1 tab MWF x 6 months.  Thrush:  completed in hospital  Patient is  tolerating infectious prophylaxis well    Plan: Continue per protocol. Continue to monitor.    CV Prophylaxis: asa 81 mg   The 10-year ASCVD risk score Denman George DC Jr., et al., 2013) is: 19.7%  Statin therapy: Indicated; currently on no statin  Plan: consider starting statin at a later date. Continue to monitor     BP: Goal < 140/90. Clinic vitals reported above  Home BP ranges: 130/80s  Current meds include: none  Plan: within goal. Continue to monitor    Volume status:  -Reports chest tightness/inability to lay flat, also has some ascites  Meds currently on: none  Plan: Start furosemide 20 mg daily and get CXR    Anemia:  H/H:   Lab Results   Component Value Date    HGB 9.9 (L) 10/07/2018     Lab Results   Component Value Date    HCT 29.7 (L) 10/07/2018     Iron panel:  Lab Results   Component Value Date    IRON 157 01/04/2018    TIBC 305.9 01/04/2018    FERRITIN 116.0 01/04/2018     Lab Results   Component Value Date    Iron Saturation (%) 51 (H) 01/04/2018       Prior ESA use: none post transplant    Plan: stable, within goal. Continue to monitor.     DM:   Lab Results   Component Value Date    A1C 4.4 (L) 09/15/2018   . Goal A1c < 7  History of Dm? Yes: T2DM  Established with endocrinologist/PCP for BG managment? Yes: PCP  Currently on: Lantus 5 units HS (pre transplant he was on Lantus 8 units HS)  Home BS log:   Breakfast Lunch  Dinner  HS   Date [facility]    10/07/2018 113   181   212   10/08/2018 102      237   10/09/2018 124     282    10/10/2018 120         Diet:did not address  Exercise:not yet Hypoglycemia: no  Plan:  Stop Lantus and replace with glipizide XL 5 mg daily.  Expect BG to improve off of prednisone. If at any point would consider changing agents, Januvia is preferred DPP4 through insurance.     Electrolytes: wnl  Meds currently on: none  Plan: Continue to monitor     GI/BM: Pt reports 2 soft stools daily, almost normal   Meds currently on:  pantoprazole 40 mg daily (not on pre transplant)  Plan: Change pantoprazole to PRN.  Will start TUMS for osteoporosis but may help with any GERD. Continue to monitor    Pain: pt reports moderate pain around drain site  Meds currently on: APAP ~twice daily, oxycodone 5 mg HS  Plan: Encouraged APAP use prior to oxycodone and NMT 3g per day. Continue to monitor    Bone health:   Vitamin D Level: 8.5 on 09/26/18. Goal > 30.   Last DEXA results: osteoporosis of spine 01/04/18 - Spine T score 2.6, femur T score -1.3  Current meds include: none  Plan: Vitamin D level  out of goal,start ergocalciferol 50,000 units weekly x8 weeks with Tums 500 mg take 3 tabs BID. Continue to monitor.     Women's/Men's Health:  Anthony Alvarado is a 58 y.o. male. Patient reports no men's/women's health  issues  Plan: Continue to monitor    Hypothyroidism  Meds currently on: levothyroxine 150 mcg  Plan: Continue to monitor    Anxiety/depression  Meds currently on: sertraline 25 mg daily  Plan: Continue to monitor    Allergies  Meds currently on: azelastin PRN, Flonase PRN    Rosacea  Meds currently on: metrogel PRN (not using as improved with prednisone), Eucrisa ointment PRN itching  Plan: Continue to monitor    Adherence: Patient has average understanding of medications; was able to independently identify names/doses of immunosuppressants and OI meds.  Patient  does fill their own pill box on a regular basis at home.  Patient brought medication card:yes  Pill box:was correct  Plan: Encouraged patient to know IS by heart for next visit; provided extensive adherence counseling/intervention Spent approximately 40 minutes on educating this patient and greater than 50% was spent in direct face to face counseling regarding post transplant medication education. Questions and concerns were address to patient's satisfaction.    Patient was reviewed with Dr. Rush Barer who was agreement with the stated plan:     During this visit, the following was completed:   BG log data assessment  BP log data assessment  Labs ordered and evaluated  complex treatment plan >1 DS   Patient education was completed for 11-24 minutes     All questions/concerns were addressed to the patient's satisfaction.  __________________________________________  Cleone Slim, PHARMD, CPP  [employer] [facility] CLINICAL PHARMACIST PRACTITIONER  PAGER [phone]

## 2018-10-10 NOTE — Unmapped (Signed)
TRANSPLANT SURGERY FOLLOW UP CLINIC NOTE       Date of Service: 10/10/2018    Current complaint: Follow up for liver transplant      Assessment and Plan:   Anthony Alvarado is a 58 y.o. male who underwent OLT on 09/16/2018. Patient with possible pleural effusion and persistent ascites today. We will evaluate with CXR and start low dose diuretic. Review of labs show normal renal function and normal LFTs; tacro level pending. Tacro trough was subtherapeutic on 8/14 but this was in the setting of holding 2 doses the day prior. Review of explant showed PAS positive globules but PI typing was normal with PiMM. Viable cancer in segment 6 without invasion; plan for q46month MRI with AFP per protocol.     # Immunosuppression:   - Tacrolimus 3 mg BID with goal trough 8-10 ng/mL; level pending today  - Myfortic 360 mg BID  - Reduce prednisone to 2.5 mg daily until 8/23 (30 days post-op)    # Prophylaxis:   - CMV D-/R+: Valcyte x 3 months (end date 12/17/18)   - PJP: Bactrim SS MWF x 6 months (end date 03/18/18)    # Volume overload  - CXR today  - Encouraged high protein, low sodium diet  - Start lasix 20 mg daily  - Maintain JP drain and empty 2-3 times per day; record daily volumes    # Miscellaneous  - Continue ASA 81 mg daily  - Continue lantus 5 u qHS and encouraged monitoring sugars prior to meals  - MRI and AFP q3 month x 1 year, then q6 months per [facility] protocol    Patient seen and discussed with Dr. Rush Barer. Return to clinic in 1 week for possible drain removal.    Justine Null, MD, [facility]  Transplant Hepatology Fellow  Pager [phone]      History of Present Illness:   Anthony Alvarado is a 58 y.o. male with cryptogenic cirrhosis c/b HE and HCC s/p RFA on 05/03/2018 now s/p OLT 09/16/2018. Post-operative course was relatively uncomplicated and he was last seen in clinic on 09/26/18 with removal of one JP drain. In the interim, he has continued to have 300-400 cc of serosanguinous drainage per day and also reports dyspnea and abdominal distension that he feels is related to fluid retention. He has further restricted his sodium intake since being advised to 3 days ago but this has not resulted in significant improvement. He has had supratherapeutic tacrolimus levels requiring dose adjustment to current dose of 3 mg BID. He has a mild tremor that predominantly affects his hands and some mild headaches that are transient but occur almost daily. Denies fevers, cough, chest pain, nausea/vomiting, neuropathy, constipation, or diarrhea. He is eating well and staying well hydrated, drinking about 80 oz per day. He is ambulating well, although having some difficulty in the hot weather.    Of note, he was having hyperglycemia around lunch and dinner, prompting initiation of lantus 5 u qHS on 8/14. He has his blood glucose log today with fasting BG in the 100-110s. His lunch and dinner sugars are not accurate as these are sometimes checked after eating.    Patient's explant was reviewed at pathology conference on 10/05/18 and showed viable cancer found within segment 6 but without vascular, lymph, or bile duct invasion. There was also a high grade dysplastic nodule with cirrhotic changes in segment 5. Prior review showed PAS-positive globules potentially consistently with A1AT but PI typing showed PiMM.  Physical Exam:  BP 163/94  - Pulse 61  - Temp 37.6 ??C (Tympanic)  - Ht 172.7 cm (5' 8)  - Wt 79.8 kg (176 lb)  - BMI 26.76 kg/m??   General Appearance:  No acute distress, well appearing and well nourished.   Head:  Normocephalic, atraumatic.   Eyes:  No scleral icterus.   Pulmonary:    Normal respiratory effort. Decreased breath sounds at right base.   Cardiovascular:  Regular rate and rhythm.   Abdomen:   Abdominal wound with staples in place and well-healing; one RUQ drain with serosanguinous drainage.   Neurologic: Non-focal exam.         Lab Results   Component Value Date    WBC 7.6 10/07/2018    HGB 9.9 (L) 10/07/2018    HCT 29.7 (L) 10/07/2018 PLT 274 10/07/2018     Lab Results   Component Value Date    NA 142 10/07/2018    K 4.9 10/07/2018    CL 105 10/07/2018    CO2 23 10/07/2018    BUN 15 10/07/2018    CREATININE 0.86 10/07/2018    CALCIUM 8.9 10/07/2018    MG 1.6 10/07/2018    PHOS 2.7 (L) 09/26/2018      Lab Results   Component Value Date    ALKPHOS 97 10/07/2018    BILITOT 0.8 10/07/2018    BILIDIR 0.43 (H) 10/07/2018    PROT 5.3 (L) 10/07/2018    ALBUMIN 3.4 (L) 09/26/2018    ALT 20 10/07/2018    AST 10 10/07/2018    GGT 58 10/07/2018      Lab Results   Component Value Date    APTT 24.2 (L) 09/18/2018        Liver explant:  A: Peritoneal tissue, biopsy  - Fibroadipose tissue with chronic inflammation  - No carcinoma identified  ??  B: Peritoneal tissue, biopsy  - Fibroadipose tissue with chronic inflammation  - No carcinoma identified  ??  C: Liver and gallbladder, explant, hepatectomy  - Well differentiated hepatocellular carcinoma, size 3.5 cm, in segment VI, status post ablation with extensive necrosis (stage ypT1b)  - No vascular invasion identified  - Vascular and bile duct margins negative for carcinoma  - Focal high grade dysplastic nodule with small cell change in segment V, size 0.5 cm (block C11)  - One lymph node with lipogranulomas and no involvement by carcinoma identified (0/1)  - See synoptic report and diagnosis comment  ??  Background liver:  - Cirrhosis (stage 4 fibrosis) with cholestasis, feathery and ballooning degeneration of hepatocytes, regenerative nodule formation, and rare macrovesicular steatosis (<1%)  - Liver weight 1875 g  - Fibrous septa with chronic inflammation and bile ductular reaction  - PAS-D positive cytoplasmic globules and mild hepatocyte iron present (see comment)  ??  D: Donor gallbladder, cholecystectomy  - Minimal acute and chronic cholecystitis  - Acute serositis  - No malignancy identified   ??  E: Liver, biopsy  - Rare acute inflammation and mild microvesicular steatosis, suggestive of minimal ischemia-reperfusion injury, with rare macrovesicular steatosis (<2%)  - No significant increase in fibrosis identified  - No hepatocyte necrosis identified  - See comment    IMAGING:  Xr Chest Portable    Result Date: 09/17/2018  EXAM: XR CHEST PORTABLE DATE: 09/17/2018 4:28 AM ACCESSION: [id number] UN DICTATED: 09/17/2018 7:48 AM INTERPRETATION LOCATION: [facility] CLINICAL INDICATION: 59 years old Male with POSTSURGICAL STATUS  COMPARISON: 09/16/2018 TECHNIQUE: Portable Chest Radiograph. FINDINGS: Unchanged support  devices. The lungs are improving with less pulmonary interstitial prominence There is no pneumothorax. Stable cardiomediastinal silhouette.     As above    Xr Chest Portable    Result Date: 09/16/2018  EXAM: XR CHEST PORTABLE DATE: 09/16/2018 5:00 PM ACCESSION: [id number] UN DICTATED: 09/16/2018 5:00 PM INTERPRETATION LOCATION: [facility] CLINICAL INDICATION: 58 years old Male with POSTSURGICAL STATUS  COMPARISON: Chest radiograph dated 09/15/2018 and prior. TECHNIQUE: Portable Chest Radiograph. FINDINGS: Endotracheal tube in place with tip terminating approximately 4.6 cm from the carina. Nonweighted enteric tube with tip in the mid thoracic esophagus. Right IJ approach CVC with tip at cavoatrial junction. Lungs are mildly hypoinflated. Patchy atelectasis in the right midlung zone. No focal consolidation or pulmonary edema. Similar appearing elevated right hemidiaphragm. No pleural effusion or pneumothorax. Unremarkable cardiomediastinal silhouette. Postsurgical changes in the right upper abdomen.     -- Malpositioned nonweighted enteric tube with tip in the mid thoracic esophagus. The findings of this study were discussed via telephone with DR. [facility] by Dr. Arnaldo Natal on 09/16/2018 5:03 PM.    Karie Georges Chest 2 Views    Result Date: 09/15/2018  EXAM: XR CHEST 2 VIEWS DATE: 09/15/2018 6:09 PM ACCESSION: [id number] UN DICTATED: 09/15/2018 6:09 PM INTERPRETATION LOCATION: [facility] CLINICAL INDICATION: 58 years old Male with Potential liver transplant  COMPARISON: Chest radiograph 09/14/2018 TECHNIQUE: PA and Lateral Chest Radiographs. FINDINGS: Interval improved aeration at the right lung base. Linear right basilar opacities are likely residual atelectasis. Similar appearing elevated right hemidiaphragm. Likely trace right pleural effusion.. No significant pneumothorax. Stable cardiomediastinal silhouette.     Improved aeration at the right lung base with similar appearing elevated right hemidiaphragm. Likely trace right pleural effusion     US Liver Transplant    Result Date: 09/16/2018  EXAM: US LIVER TRANSPLANT DATE: 09/16/2018 7:02 PM ACCESSION: [id number] UN DICTATED: 09/16/2018 7:03 PM INTERPRETATION LOCATION: [facility] CLINICAL INDICATION: 58 year old Male. Check vascular patency  TECHNIQUE: Ultrasound views of the complete abdomen were obtained using gray scale and color and spectral Doppler imaging. COMPARISON: Liver Doppler ultrasound dated 09/13/2018. FINDINGS: HEPATOBILIARY: The liver is mildly enlarged with heterogeneous echogenicity. No focal hepatic lesions. No intrahepatic biliary ductal dilatation. The common bile duct is normal in caliber. The gallbladder is surgically absent. There is a 6.7 x 6.4 x 1.2cm heterogeneous area within the peripheral right hepatic lobe which appears subcapsular. PANCREAS: Visualized portion is unremarkable. SPLEEN: Mild splenomegaly, measuring up to 13.9 cm. KIDNEYS: Normal in size and echotexture. No solid masses or calculi. No hydronephrosis. VESSELS: - Portal vein: The main, left and right portal veins are patent with hepatopetal flow. There is mild increased velocity within the main portal vein at the anastomosis, which is likely postsurgical in nature. - Splenic vein: Patent, with hepatopetal flow. - Hepatic veins/IVC: The IVC, left, middle and right hepatic veins are patent with bi/triphasic waveforms. - Hepatic artery: Patent with resistive indices within normal limits. - Visualized proximal aorta:  unremarkable OTHER: Small volume ascites.     -- Patent hepatic transplant vasculature. -- Baseline resistive indices in the hepatic transplant arteries, within normal limits. -- 6.7 x 6.4 x 1.2 cm crescentic collection along the capsule of the right hepatic lobe, likely a hematoma. -- Small volume abdominopelvic ascites. Please see below for data measurements: Liver: 16.1 cm Common bile duct: 0.28 cm Right kidney length: 10.5 cm Left kidney length: 9.6 cm Main portal vein diameter: 0.99 cm Main portal vein pre anastomosis velocity: 0.618 m/s Main portal vein anastomosis  velocity: 0.727 m/s Main portal vein post anastomosis velocity: 0.591 m/s Anterior right portal vein velocity: 0.270 m/s Posterior right portal vein velocity: 0.438 m/s Left portal vein velocity: 0.305 m/s Right portal vein flow: hepatopetal Left portal vein flow: hepatopetal Common hepatic artery resistive index: 0.62 and systolic acceleration time 50 msec Right hepatic artery resistive index: 0.53 and systolic acceleration time 33 msec Left hepatic artery resistive index: 0.49 and systolic acceleration time 38 msec Left hepatic vein flow: mono-bi Middle hepatic vein flow: mono-bi Right hepatic vein flow: mono-bi Inferior vena cava flow: mono-bi Splenic vein midline flow: hepatopetal Splenic vein proximal flow: hepatopetal Aorta: partially visualized Inferior vena cava: partially visualized Spleen: 13.9 cm Abdominal free fluid visualized: yes    Echocardiogram W Colorflow Spectral Doppler    Result Date: 09/17/2018  ?? Normal left ventricular systolic function, ejection fraction > 55% ?? Probably congenitally malformed (bicuspid) aortic valve ?? Aortic stenosis - moderate ?? Mitral annular calcification ?? Normal right ventricular systolic function ?? Dilated ascending aorta - mild      Xr Abdomen 1 View    Result Date: 09/17/2018  EXAM: XR ABDOMEN 1 VIEW DATE: 09/16/2018 7:32 PM ACCESSION: [id number] UN DICTATED: 09/17/2018 7:49 AM INTERPRETATION LOCATION: [facility] CLINICAL INDICATION: 58 years old Male with NGT (CATHETER NON VASC FIT & ADJ)  COMPARISON: September 16, 2018 at 1749 TECHNIQUE: Supine view of the abdomen. FINDINGS: The bowel gas pattern is nonobstructive. The enteric tube terminates in the distal stomach. Skin staples and soft tissue drains are unchanged. No large amount of free air is seen below the diaphragm, limited by supine positioning.     1. Stable postsurgical changes. No acute findings.    Xr Abdomen 1 View    Result Date: 09/16/2018  EXAM: XR ABDOMEN 1 VIEW DATE: 09/16/2018 5:02 PM ACCESSION: [id number] UN DICTATED: 09/16/2018 5:01 PM INTERPRETATION LOCATION: [facility] CLINICAL INDICATION: 58 years old Male with NGT (CATHETER NON VASC FIT & ADJ)  COMPARISON: None. TECHNIQUE: Supine view of the abdomen. FINDINGS: Malpositioned enteric tube with tip in the mid thoracic esophagus. Surgical drains within the right upper quadrant. Surgical staples overlie the midline abdomen. Nonobstructive bowel gas pattern. Lung bases are clear. No acute osseous abnormalities Refer to concurrent chest for findings above the diaphragm.     Malpositioned enteric tube with tip in the mid thoracic esophagus. Recommend advancement by approximately 20 cm.     Xr Abdomen Portable    Result Date: 09/16/2018  EXAM: XR ABDOMEN PORTABLE DATE: 09/16/2018 5:53 PM ACCESSION: [id number] UN DICTATED: 09/16/2018 5:52 PM INTERPRETATION LOCATION: [facility] CLINICAL INDICATION: 58 years old Male with NGT (CATHETER NON VASC FIT & ADJ)  COMPARISON: Same day abdominal radiograph. TECHNIQUE: Supine view of the abdomen. CONCLUSIONS: Nonweighted enteric tube with tip and sidehole in the gastric body  Otherwise, no significant same-day interval changes.    Tee    Result Date: 09/16/2018  Rolanda Jay, MD     09/16/2018  3:51 PM TEE TEE insertion: Atraumatic, without difficulty Purpose: Monitoring Patient location: OR INDICATION(S) Valvular dysfunction: Moderate Modalities used: 2D, Pulse Wave, Continuous Wave and Color Doppler STAFF Performed: attending and resident/CRNA Anesthesiologist: Rolanda Jay, MD Resident/CRNA: Rolland Porter, MD LEFT VENTRICLE Overall appearance: Normal LVH: Moderate Estimated systolic function: EF 45-55% Wall Motion Abnormalities HEMODYNAMICS RIGHT VENTRICLE Overall appearance: Normal RV Function: Normal ATRIAL CHAMBERS Left atrium: Normal Left atrial appendage: Normal Right atrium: Mildly dilated Interatrial Septum: Intact EFFUSIONS Right pleural effusion: no Left pleural effusion: no Pericardial: none VALVES  Aortic Valve Aortic valve morphology: Calcification and Bicuspid Aortic Stenosis severity: moderate (AS mod stenosis by peak velocities and gradients, severe by planimetry and doppler velocity index.) Aortic Insufficiency severity: mild Mitral Valve Mitral valve morphology: Thickened Mitral Regurgitation: trace Pulmonary Vein Inflow Tricuspid Valve Tricuspid valve morphology: Grossly normal Pulmonic Valve AORTA Ascending aorta: Grade I Aortic arch: Grade I Descending aorta: Grade I

## 2018-10-10 NOTE — Unmapped (Signed)
Patient seen in clinic today for drain/wound care f/u. Although instructed to empty his drain 2-3 x times, he is finding it necessary to drain it 4-6x daily with output averaging 300 over the last 6 days. Drainage is serosanguinous but noticeably lighter-rose colored. He denies fever, n/v/d or night sweats. He reports pain is managed by tylenol during the day and an oxycodone at night. He reports he is drinking at least 80oz of fluid daily and eating well. Wt has been stable over the last week.He is having some mild pedal edema, but c/o SOB for the last several day, and Dr.Kim noted some adventitious breath sounds and ascites. Patient sent down at end of visit for cxr and lasix 20mg  started daily today. Patient seen by PharmD, who lowered his pred to 2.5mg  with stop date after 1 wk. He will stop his lantus and begin glipizide. Wound is well approximated with no excess erythema. Dressing applied at drain site is clean with very little drainage since they learned to compress the bulb after draining it. Patient seen by Dr.Gerber as well, who recommended he return next week. Will f/u on his IS level today and adjust dose as needed. Spent 10 minutes educating patient/wife on post-txp care, offering reassurance that his sx are improving. They verbalized understanding of all discussed and agree to return next week with BG logs. Will complete high risk test next week for PHS increased risk donor.

## 2018-10-10 NOTE — Unmapped (Signed)
Per test claim for Tradjenta at the [facility], patient needs Medication Assistance Program for Prior Authorization.

## 2018-10-10 NOTE — Unmapped (Signed)
Patient's tac level subar again this week. Per txp pharmacist, patient dose should be increased to 4mg /3mg  daily. Contacted patient and relayed this change. Let him know he would be contacted with next week's appt time. Orders placed for high risk testing. He verbalized understanding.

## 2018-10-11 NOTE — Unmapped (Signed)
Change in Tacrolimus dosage decrease. Refill too soon until 10/15/2018. Will attempt test claim on 10/17/2018 date.

## 2018-10-11 NOTE — Unmapped (Signed)
Called pt with appt times for 8/24 - 830 lab, 900 clinic. He asked what appt would be about and this tpa suggested he contact primary coord for that answer. He verbalized understanding of all discussed.

## 2018-10-11 NOTE — Unmapped (Signed)
[facility] Pharmacist Intervention    Type of intervention: dosage change for specialty/NTI drug    Medication: tacrolimus    Problem: new rx received for dosage decrease to 4/3 (last rx filled was 5/5)    Intervention: per epic notes yesterday 8/17, this change was communicated to patient via coordinator    Follow up needed: even though dosage decrease, will not adjust date at this time due to other specialty meds needing to be filled on original schedule    Approximate time spent: 10 minutes    [name]   [facility] Specialty Pharmacist

## 2018-10-12 ENCOUNTER — Encounter: Admit: 2018-10-12 | Discharge: 2018-10-13 | Payer: MEDICARE

## 2018-10-12 DIAGNOSIS — R0602 Shortness of breath: Secondary | ICD-10-CM

## 2018-10-12 DIAGNOSIS — R509 Fever, unspecified: Principal | ICD-10-CM

## 2018-10-12 DIAGNOSIS — R5383 Other fatigue: Secondary | ICD-10-CM

## 2018-10-12 DIAGNOSIS — Z79899 Other long term (current) drug therapy: Secondary | ICD-10-CM | POA: Diagnosis not present

## 2018-10-12 DIAGNOSIS — Z20828 Contact with and (suspected) exposure to other viral communicable diseases: Secondary | ICD-10-CM | POA: Diagnosis not present

## 2018-10-12 DIAGNOSIS — J90 Pleural effusion, not elsewhere classified: Secondary | ICD-10-CM | POA: Diagnosis not present

## 2018-10-12 DIAGNOSIS — Z944 Liver transplant status: Secondary | ICD-10-CM | POA: Diagnosis not present

## 2018-10-12 DIAGNOSIS — R918 Other nonspecific abnormal finding of lung field: Secondary | ICD-10-CM | POA: Diagnosis not present

## 2018-10-12 DIAGNOSIS — J189 Pneumonia, unspecified organism: Secondary | ICD-10-CM | POA: Diagnosis not present

## 2018-10-12 DIAGNOSIS — Z5181 Encounter for therapeutic drug level monitoring: Secondary | ICD-10-CM | POA: Diagnosis not present

## 2018-10-12 LAB — COMPREHENSIVE METABOLIC PANEL
ALBUMIN: 3.9 g/dL (ref 3.5–5.0)
ALKALINE PHOSPHATASE: 98 U/L (ref 38–126)
ALT (SGPT): 25 U/L (ref <50)
ANION GAP: 8 mmol/L (ref 7–15)
AST (SGOT): 40 U/L (ref 19–55)
BILIRUBIN TOTAL: 1 mg/dL (ref 0.0–1.2)
BLOOD UREA NITROGEN: 24 mg/dL — ABNORMAL HIGH (ref 7–21)
BUN / CREAT RATIO: 29
CHLORIDE: 102 mmol/L (ref 98–107)
CO2: 26 mmol/L (ref 22.0–30.0)
CREATININE: 0.82 mg/dL (ref 0.70–1.30)
EGFR CKD-EPI AA MALE: >90 mL/min/{1.73_m2} (ref >=60)
EGFR CKD-EPI NON-AA MALE: >90 mL/min/{1.73_m2} (ref >=60)
POTASSIUM: 4.9 mmol/L (ref 3.5–5.0)
PROTEIN TOTAL: 6.8 g/dL (ref 6.5–8.3)
SODIUM: 136 mmol/L (ref 135–145)

## 2018-10-12 LAB — BLOOD GAS, VENOUS
BASE EXCESS VENOUS: 2.3 — ABNORMAL HIGH (ref -2.0–2.0)
O2 SATURATION VENOUS: 27.4 % — ABNORMAL LOW (ref 40.0–85.0)
PCO2 VENOUS: 50 mmHg (ref 40–60)
PH VENOUS: 7.36 (ref 7.32–7.43)
PO2 VENOUS: <20 mmHg — ABNORMAL LOW (ref 30–55)

## 2018-10-12 LAB — PROTIME-INR: PROTIME: 13.7 s — ABNORMAL HIGH (ref 10.2–13.1)

## 2018-10-12 LAB — TROPONIN I
TROPONIN I: 0.064 ng/mL (ref <0.034)
Troponin I.cardiac:MCnc:Pt:Ser/Plas:Qn:: 0.056
Troponin I.cardiac:MCnc:Pt:Ser/Plas:Qn:: 0.064

## 2018-10-12 LAB — BASOPHILS RELATIVE PERCENT: Lab: 0.5

## 2018-10-12 LAB — URINALYSIS
BILIRUBIN UA: NEGATIVE
BLOOD UA: NEGATIVE
GLUCOSE UA: NEGATIVE
LEUKOCYTE ESTERASE UA: NEGATIVE
NITRITE UA: NEGATIVE
PH UA: 6 (ref 5.0–9.0)
PROTEIN UA: NEGATIVE
SPECIFIC GRAVITY UA: 1.006 (ref 1.003–1.030)
SQUAMOUS EPITHELIAL: <1 /HPF (ref 0–5)
UROBILINOGEN UA: 0.2
WBC UA: 2 /HPF (ref <=2)

## 2018-10-12 LAB — UROBILINOGEN UA: Lab: 0.2

## 2018-10-12 LAB — O2 SATURATION VENOUS: Oxygen saturation:MFr:Pt:BldV:Qn:: 27.4 — ABNORMAL LOW

## 2018-10-12 LAB — CBC W/ AUTO DIFF
BASOPHILS ABSOLUTE COUNT: 0.1 10*9/L (ref 0.0–0.1)
BASOPHILS RELATIVE PERCENT: 0.5 %
EOSINOPHILS ABSOLUTE COUNT: 0.8 10*9/L — ABNORMAL HIGH (ref 0.0–0.4)
EOSINOPHILS RELATIVE PERCENT: 6.9 %
HEMATOCRIT: 36.6 % — ABNORMAL LOW (ref 41.0–53.0)
HEMOGLOBIN: 11.6 g/dL — ABNORMAL LOW (ref 13.5–17.5)
LARGE UNSTAINED CELLS: 1 % (ref 0–4)
LYMPHOCYTES ABSOLUTE COUNT: 1.2 10*9/L — ABNORMAL LOW (ref 1.5–5.0)
LYMPHOCYTES RELATIVE PERCENT: 10 %
MEAN CORPUSCULAR HEMOGLOBIN CONC: 31.7 g/dL (ref 31.0–37.0)
MEAN CORPUSCULAR HEMOGLOBIN: 30.6 pg (ref 26.0–34.0)
MEAN PLATELET VOLUME: 6.9 fL — ABNORMAL LOW (ref 7.0–10.0)
MONOCYTES ABSOLUTE COUNT: 0.7 10*9/L (ref 0.2–0.8)
MONOCYTES RELATIVE PERCENT: 5.8 %
NEUTROPHILS ABSOLUTE COUNT: 8.7 10*9/L — ABNORMAL HIGH (ref 2.0–7.5)
NEUTROPHILS RELATIVE PERCENT: 75.3 %
PLATELET COUNT: 386 10*9/L (ref 150–440)
RED BLOOD CELL COUNT: 3.8 10*12/L — ABNORMAL LOW (ref 4.50–5.90)
RED CELL DISTRIBUTION WIDTH: 16.9 % — ABNORMAL HIGH (ref 12.0–15.0)
WBC ADJUSTED: 11.5 10*9/L — ABNORMAL HIGH (ref 4.5–11.0)

## 2018-10-12 LAB — AST (SGOT): Aspartate aminotransferase:CCnc:Pt:Ser/Plas:Qn:: 40

## 2018-10-12 LAB — SMEAR REVIEW

## 2018-10-12 LAB — APTT
Coagulation surface induced:Time:Pt:PPP:Qn:Coag: 29.6
HEPARIN CORRELATION: 0.2

## 2018-10-12 LAB — LIPASE: Triacylglycerol lipase:CCnc:Pt:Ser/Plas:Qn:: 37 — ABNORMAL LOW

## 2018-10-12 LAB — INR: Lab: 1.19

## 2018-10-12 LAB — LACTATE BLOOD VENOUS: Lactate:SCnc:Pt:BldV:Qn:: 1.8

## 2018-10-12 LAB — LACTATE, VENOUS, WHOLE BLOOD: LACTATE BLOOD VENOUS: 1.8 mmol/L (ref 0.5–1.8)

## 2018-10-12 LAB — PRO-BNP: Natriuretic peptide.B prohormone N-Terminal:MCnc:Pt:Ser/Plas:Qn:: 1110 — ABNORMAL HIGH

## 2018-10-12 NOTE — Unmapped (Signed)
[facility]  Emergency Department Provider Note  ??   ED Clinical Impression   ??  Final diagnoses:   None       ??   Impression, ED Course, Assessment and Plan   ??  Impression: Anthony Alvarado is a 58 y.o. male patient with history of cholestatic cirrhosis of liver and hepatic encephalopathy s/p liver transplant on 09/16/2018, type II diabetes mellitus, bicuspid aortic valve stenosis, and asthma presenting today for evaluation of new onset fever this morning along with shortness of breath that has been ongoing for five days with an associated non-productive cough, fatigue, and generalized weakness.     On exam, patient is chronically ill appearing. Vitals WNL, afebrile here. Lungs clear bilaterally, heart rate and rhythm regular, no pleuritic presentation. Abdomen soft, well-healing surgical incision site with no surrounding erythema, edema or other infectious signs, no fluctuance or induration. Otherwise benign exam.     Plan for CBC, CMP, UA, troponin, BVG, cultures, and batch COVID screening. CXR with noted RLL opacity - consider possible infection vs. Atelectasis. Will cover for infection given elevated temperature. Consider also possible PE given progressive shortness of breath in the setting of recent surgery. Given my suspicion if greater for an infection, I think it is reasonable to start treatment for infection and close monitoring with consideration of CTA chest if symptoms persist or worsen. Anticipate admission and transplant team involvement.        Additional Medical Decision Making     I have reviewed the vital signs and the nursing notes. Labs and radiology results that were available during my care of the patient were independently reviewed by me and considered in my medical decision making.     I directly visualized and independently interpreted the EKG tracing.   I independently visualized the radiology images.   I reviewed the patient's prior medical records.   I discussed the case with the transplant surgery consultant.   I discussed the case and plan for continuity of care with the admitting provider.   I discussed the case with the radiologist.     Portions of this record have been created using Dragon dictation software. Dictation errors have been sought, but Rylah Fukuda not have been identified and corrected.  ____________________________________________         History   ??    Chief Complaint  Fever Between 25 Weeks and 58 Years Old      HPI   Anthony Alvarado is a 58 y.o. male patient with history of cholestatic cirrhosis of liver and hepatic encephalopathy s/p liver transplant on 09/16/2018, type II diabetes mellitus, bicuspid aortic valve stenosis, and asthma presenting today with fever. Patient reports new onset fever that began this morning along with five days of shortness of breath and intermittent nonproductive cough. He took his temperature orally which was 100.2. He took a Tylenol following. Called his transplant coordinator who told him to come in to be evaluated. He also has some residual abdominal pain that he attributes to his surgical site. He denies any known COVID contacts and has been isolating at home given immunocompromised state. He denies any frank chest pain or pleuritic symptoms. No other URI symptoms.       Past Medical History:   Diagnosis Date   ??? Aortic valve stenosis    ??? Autoimmune cholangitis    ??? 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 Cholestatic cirrhosis (CMS-HCC)    ??? Hepatic encephalopathy (  CMS-HCC)    ??? Hypertension    ??? Sleep apnea    ??? Type 2 diabetes mellitus (CMS-HCC)        Patient Active Problem List   Diagnosis   ??? Diabetes mellitus without complication (CMS-HCC)   ??? Hypothyroidism   ??? Cirrhosis of liver without ascites (CMS-HCC)   ??? OSA (obstructive sleep apnea)   ??? Essential hypertension   ??? Encounter for pre-transplant evaluation for chronic liver disease   ??? Pleural effusion   ??? Pneumothorax after biopsy   ??? Liver transplant recipient (CMS-HCC) ??? Allergic rhinitis   ??? Arthralgia   ??? Asthma   ??? Bicuspid aortic valve   ??? Flying phobia   ??? Mucosal abnormality of stomach   ??? Nephrolithiasis   ??? Pure hypercholesterolemia   ??? Umbilical hernia   ??? Unspecified hearing loss       Past Surgical History:   Procedure Laterality Date   ??? APPENDECTOMY     ??? CHG US GUIDE, TISSUE ABLATION N/A 05/03/2018    Procedure: ULTRASOUND GUIDANCE FOR, AND MONITORING OF, PARENCHYMAL TISSUE ABLATION;  Surgeon: Particia Nearing, MD;  Location: MAIN [facility];  Service: Transplant   ??? PR CATH PLACE/CORON ANGIO, IMG SUPER/INTERP,R&L HRT CATH, L HRT VENTRIC N/A 03/21/2018    Procedure: Left/Right Heart Catheterization;  Surgeon: Neal Dy, MD;  Location: [facility] CATH;  Service: Cardiology   ??? PR LAP,ABLAT 1+ LIVER TUMOR(S),RADIOFREQ N/A 05/03/2018    Procedure: LAPAROSCOPY, SURGICAL, ABLATION OF 1 OR MORE LIVER TUMOR(S); RADIOFREQUENCY;  Surgeon: Particia Nearing, MD;  Location: MAIN OR [facility];  Service: Transplant   ??? PR TRANSPLANT LIVER,ALLOTRANSPLANT Bilateral 09/16/2018    Procedure: LIVER ALLOTRANSPLANTATION; ORTHOTOPIC, PARTIAL OR WHOLE, FROM CADAVER OR LIVING DONOR, ANY AGE;  Surgeon: Florene Glen, MD;  Location: MAIN OR [facility];  Service: Transplant   ??? PR TRANSPLANT,PREP DONOR LIVER, WHOLE N/A 09/16/2018    Procedure: [facility] STD PREP CAD DONOR WHOLE LIVER GFT PRIOR TNSPLNT,INC CHOLE,DISS/REM SURR TISSU WO TRISEG/LOBE SPLT;  Surgeon: Florene Glen, MD;  Location: MAIN OR [place];  Service: Transplant       No current facility-administered medications for this encounter.     Current Outpatient Medications:   ???  acetaminophen (TYLENOL) 325 MG tablet, Take 2 tablets (650 mg total) by mouth every six (6) hours as needed for pain or fever (> 38C or 100.34F)., Disp: 100 tablet, Rfl: 0  ???  aspirin (ECOTRIN) 81 MG tablet, Take 1 tablet (81 mg total) by mouth daily., Disp: 30 tablet, Rfl: 11  ???  azelastine (ASTELIN) 137 mcg (0.1 %) nasal spray, 1 spray by Each Nare route daily as needed. , Disp: , Rfl:   ???  blood sugar diagnostic Strp, Use as directed Three (3) times a day before meals., Disp: 90 each, Rfl: 11  ???  [START ON 11/23/2018] cholecalciferol, vitamin D3, 1,000 unit (25 mcg) tablet, Take 2 tablets (2,000 Units total) by mouth daily., Disp: 60 tablet, Rfl: 11  ???  ergocalciferol (DRISDOL) 1,250 mcg (50,000 unit) capsule, Take 1 capsule (50,000 Units total) by mouth once a week for 8 doses., Disp: 4 capsule, Rfl: 1  ???  EUCRISA 2 % Oint, Apply 1 application topically daily as needed. , Disp: , Rfl:   ???  fluticasone propionate (FLONASE) 50 mcg/actuation nasal spray, PLACE ONE OR TWO SPRAYS INTO BOTH NOSTRILS DAILY AS NEEDED., Disp: , Rfl:   ???  furosemide (LASIX) 20 MG tablet, Take 1 tablet (20 mg total)  by mouth daily., Disp: 30 tablet, Rfl: 1  ???  glipiZIDE (GLUCOTROL XL) 5 MG 24 hr tablet, Take 1 tablet (5 mg total) by mouth daily., Disp: 30 tablet, Rfl: 11  ???  lancets 33 gauge Misc, 1 each by Miscellaneous route Three (3) times a day before meals., Disp: 100 each, Rfl: 11  ???  levothyroxine (SYNTHROID) 150 MCG tablet, Take 1 tablet (150 mcg total) by mouth daily., Disp: 30 tablet, Rfl: 11  ???  magnesium oxide-Mg AA chelate (MAGNESIUM, AMINO ACID CHELATE,) 133 mg Tab, Take 1 tablet by mouth Two (2) times a day. HOLD until directed to start by your coordinator. (Patient not taking: Reported on 09/24/2018), Disp: 60 tablet, Rfl: 11  ???  metroNIDAZOLE (METROGEL) 1 % gel, Apply 1 application topically daily as needed. , Disp: , Rfl:   ???  mycophenolate (MYFORTIC) 180 MG EC tablet, Take 2 tablets (360 mg total) by mouth Two (2) times a day., Disp: 120 tablet, Rfl: 11  ???  oxyCODONE (ROXICODONE) 5 MG immediate release tablet, Take 1-2 tablets (5-10 mg total) by mouth every six (6) hours as needed for pain., Disp: 40 tablet, Rfl: 0  ???  pantoprazole (PROTONIX) 40 MG tablet, Take 1 tablet (40 mg total) by mouth daily as needed., Disp: 30 tablet, Rfl: 11  ???  predniSONE (DELTASONE) 5 MG tablet, Take 0.5 tablets (2.5 mg total) by mouth daily for 7 days. Stop on 10/18/18, Disp: 30 tablet, Rfl: 11  ???  sertraline (ZOLOFT) 25 MG tablet, Take 1 tablet (25 mg total) by mouth daily., Disp: 30 tablet, Rfl: 11  ???  sulfamethoxazole-trimethoprim (BACTRIM) 400-80 mg per tablet, Take 1 tablet (80 mg of trimethoprim total) by mouth 3 (three) times a week., Disp: 12 tablet, Rfl: 5  ???  tacrolimus (PROGRAF) 1 MG capsule, Take 4 capsules (4 mg) by mouth in the morning and 3 capsules (3 mg) in the evening, Disp: 210 capsule, Rfl: 11  ???  valGANciclovir (VALCYTE) 450 mg tablet, Take 1 tablet (450 mg total) by mouth daily., Disp: 30 tablet, Rfl: 2    Facility-Administered Medications Ordered in Other Encounters:   ???  diazePAM (VALIUM) 5 mg/mL injection, , , ,     Allergies  Watermelon flavor    Family History   Problem Relation Age of Onset   ??? Asthma Mother    ??? COPD Father    ??? Cancer Father    ??? Autoimmune disease Sister         AIH   ??? Heart disease Sister    ??? Colon cancer Maternal Grandfather        Social History  Social History     Tobacco Use   ??? Smoking status: Never Smoker   ??? Smokeless tobacco: Never Used   Substance Use Topics   ??? Alcohol use: Not Currently   ??? Drug use: Never       Review of Systems  Constitutional: Positive for fever and fatigue.   ENT: Negative for sore throat.  Cardiovascular: Negative for chest pain.  Respiratory: Positive for shortness of breath and nonproductive cough.  Gastrointestinal: Negative for abdominal pain, vomiting or diarrhea.  Genitourinary: Negative for dysuria.  Musculoskeletal: Negative for back pain.  Neurological: Negative for headaches, focal weakness or numbness.       Physical Exam     ED Triage Vitals [10/12/18 1000]   Enc Vitals Group      BP 129/84      Heart Rate 78  SpO2 Pulse       Resp 14      Temp 36.9 ??C (98.4 ??F)      Temp Source Oral      SpO2 97 %     Constitutional: Alert and oriented. Chronically ill appearing and in no distress. Afebrile. Eyes: Conjunctivae are normal.  ENT       Head: Normocephalic and atraumatic.       Nose: No congestion.       Mouth/Throat: Mucous membranes are moist.       Neck: No stridor.  Cardiovascular: Normal rate, regular rhythm. Normal and symmetric distal pulses are present in all extremities.  Respiratory: Normal respiratory effort. Decreased BS R base, otherwise no crackles or wheezes.   Gastrointestinal: Well healing surgical scar with no surrounding induration or fluctuance, no erythema or edema or other infectious signs. Soft and nontender. There is no CVA tenderness.  Musculoskeletal: Normal range of motion in all extremities.       Right lower leg: No tenderness or edema.       Left lower leg: No tenderness or edema.  Neurologic: Normal speech and language. No gross focal neurologic deficits are appreciated.  Skin: Skin is warm, dry and intact. No rash noted.  Psychiatric: Mood and affect are normal. Speech and behavior are normal.     EKG   NSR, no ST elevation/depression     Radiology     XR Chest 1 view Portable   Final Result   Right medial lung base patchy airspace opacity, Harlea Goetzinger represent infection the appropriate clinical setting versus atelectasis.      Stable small right pleural effusion.          Documentation assistance was provided by Woody Seller, Scribe, on October 12, 2018 at 10:09 AM for Jovon Winterhalter, MD.       A scribe was used when documenting this visit. I agree with the above documentation. Signed by  Librada Castronovo Y Jaythen Hamme on  October 12, 2018 at 1:43 PM           Kariah Loredo Faythe Ghee, MD  10/12/18 1346

## 2018-10-12 NOTE — Unmapped (Signed)
Transplant Surgery History & Physical  Note    Requesting Attending Physician:  Phillips Grout Des*  Service Requesting Consult:  Emergency Medicine  Service Providing Consult: Transplant Surgery  Consulting Attending: Dr. Celine Mans     Assessment:  Anthony Alvarado is a 58 y.o. male with history of severe aortic valve stenosis, HTN, DMII, OSA and Autoimmune cholangitis, carrier of hemochromatosis HFE gene mutation, cholestatic cirrhosis, hepatic encephalopathy now s/p OLT 09/16/18, discharged 7/31 who presents to [facility] ED on 10/12/18 due to low grade fevers (100.2) , shortness of breath and fatigue.     Afebrile, HDS, leukocytosis (11.5) without benign abdomen w/ well healing incision, JP w/ light SS output and mild distention, lungs clear without crackles or evidence of overload. CXR w/ RML opacity concerning for infection vs. Atelectasis and a small right pleural effusion. Given immune compromised state with fevers, leukocytosis and SOB concern for underlying pneumonia requiring admission for antibiotics and hemodynamic monitoring.     PLAN:     - Admit to [facility], Floor status  - Trend trops given mildly elevation (0.06), consult cardiology if continue to rise in setting of severe AS  - Stable on room air  - CT Chest if patient's respiratory status declines   - IV Levaquin  - Daily labs   - Floor status    If you have any questions, concerns or changes in the patient's clinical status, please feel free to contact [facility] floor pager [phone]. Thank you for this interesting consult.    Emilio Aspen, MD  [facility] General Surgery, PGY3    History of Present Illness:   Chief Complaint:  Fever, SOB    Anthony Alvarado is a 58 y.o. male who is seen in consultation for Fever, SOB at the request of Chirag Sureshchandra Des* on the Emergency Medicine service.     Anthony Alvarado is 58 y.o. male with history of severe aortic valve stenosis, HTN, DMII, OSA and Autoimmune cholangitis, carrier of hemochromatosis HFE gene mutation, cholestatic cirrhosis, hepatic encephalopathy now s/p OLT 09/16/18, discharged 7/31 who presents to [facility] ED on 10/12/18 due to low grade fevers (100.2) , shortness of breath and fatigue.     He reports that overall he has been doing very well since discharge with minimal pain, improving energy and appetite. He reports that over the past 1-2 days he has felt progressively fatigue, especially with going outdoors in the heat and noted a fever of 100.2 this monring associated with shortness of breath. He has been aggressively using his incentive spiromete at home to improve his lung capacity.     He was recently seen in clinic 2 days ago with concern for fluid overload at that time for which he was started on PO lasix 20 mg QD. His JP drain output has significantly decreased over the past 2 days prior emptying it multiple times per day, now < 20 cc per day. Denies nausea, emesis, chest pain, abdominal pain or diarrhea.       Past Medical History:   Past Medical History:   Diagnosis Date   ??? Aortic valve stenosis    ??? Autoimmune cholangitis    ??? 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 Cholestatic cirrhosis (CMS-HCC)    ??? Hepatic encephalopathy (CMS-HCC)    ??? Hypertension    ??? Sleep apnea    ??? Type 2 diabetes mellitus (CMS-HCC)        Past Surgical History:  Past Surgical History:  Procedure Laterality Date   ??? APPENDECTOMY     ??? CHG US GUIDE, TISSUE ABLATION N/A 05/03/2018    Procedure: ULTRASOUND GUIDANCE FOR, AND MONITORING OF, PARENCHYMAL TISSUE ABLATION;  Surgeon: Particia Nearing, MD;  Location: MAIN OR [facility];  Service: Transplant   ??? PR CATH PLACE/CORON ANGIO, IMG SUPER/INTERP,R&L HRT CATH, L HRT VENTRIC N/A 03/21/2018    Procedure: Left/Right Heart Catheterization;  Surgeon: Neal Dy, MD;  Location: [facility] CATH;  Service: Cardiology   ??? PR LAP,ABLAT 1+ LIVER TUMOR(S),RADIOFREQ N/A 05/03/2018    Procedure: LAPAROSCOPY, SURGICAL, ABLATION OF 1 OR MORE LIVER TUMOR(S); RADIOFREQUENCY;  Surgeon: Particia Nearing, MD;  Location: MAIN OR [facility];  Service: Transplant   ??? PR TRANSPLANT LIVER,ALLOTRANSPLANT Bilateral 09/16/2018    Procedure: LIVER ALLOTRANSPLANTATION; ORTHOTOPIC, PARTIAL OR WHOLE, FROM CADAVER OR LIVING DONOR, ANY AGE;  Surgeon: Florene Glen, MD;  Location: MAIN OR [facility];  Service: Transplant   ??? PR TRANSPLANT,PREP DONOR LIVER, WHOLE N/A 09/16/2018    Procedure: BACKBNCH STD PREP CAD DONOR WHOLE LIVER GFT PRIOR TNSPLNT,INC CHOLE,DISS/REM SURR TISSU WO TRISEG/LOBE SPLT;  Surgeon: Florene Glen, MD;  Location: MAIN OR [place];  Service: Transplant       Medications:  Current Facility-Administered Medications on File Prior to Encounter   Medication Dose Route Frequency Provider Last Rate Last Dose   ??? diazePAM (VALIUM) 5 mg/mL injection              Current Outpatient Medications on File Prior to Encounter   Medication Sig Dispense Refill   ??? acetaminophen (TYLENOL) 325 MG tablet Take 2 tablets (650 mg total) by mouth every six (6) hours as needed for pain or fever (> 38C or 100.3F). 100 tablet 0   ??? aspirin (ECOTRIN) 81 MG tablet Take 1 tablet (81 mg total) by mouth daily. 30 tablet 11   ??? azelastine (ASTELIN) 137 mcg (0.1 %) nasal spray 1 spray by Each Nare route daily as needed.      ??? blood sugar diagnostic Strp Use as directed Three (3) times a day before meals. 90 each 11   ??? [START ON 11/23/2018] cholecalciferol, vitamin D3, 1,000 unit (25 mcg) tablet Take 2 tablets (2,000 Units total) by mouth daily. 60 tablet 11   ??? ergocalciferol (DRISDOL) 1,250 mcg (50,000 unit) capsule Take 1 capsule (50,000 Units total) by mouth once a week for 8 doses. 4 capsule 1   ??? EUCRISA 2 % Oint Apply 1 application topically daily as needed.      ??? fluticasone propionate (FLONASE) 50 mcg/actuation nasal spray PLACE ONE OR TWO SPRAYS INTO BOTH NOSTRILS DAILY AS NEEDED.     ??? furosemide (LASIX) 20 MG tablet Take 1 tablet (20 mg total) by mouth daily. 30 tablet 1   ??? glipiZIDE (GLUCOTROL XL) 5 MG 24 hr tablet Take 1 tablet (5 mg total) by mouth daily. 30 tablet 11   ??? lancets 33 gauge Misc 1 each by Miscellaneous route Three (3) times a day before meals. 100 each 11   ??? levothyroxine (SYNTHROID) 150 MCG tablet Take 1 tablet (150 mcg total) by mouth daily. 30 tablet 11   ??? magnesium oxide-Mg AA chelate (MAGNESIUM, AMINO ACID CHELATE,) 133 mg Tab Take 1 tablet by mouth Two (2) times a day. HOLD until directed to start by your coordinator. (Patient not taking: Reported on 09/24/2018) 60 tablet 11   ??? metroNIDAZOLE (METROGEL) 1 % gel Apply 1 application topically daily as needed.      ???  mycophenolate (MYFORTIC) 180 MG EC tablet Take 2 tablets (360 mg total) by mouth Two (2) times a day. 120 tablet 11   ??? oxyCODONE (ROXICODONE) 5 MG immediate release tablet Take 1-2 tablets (5-10 mg total) by mouth every six (6) hours as needed for pain. 40 tablet 0   ??? pantoprazole (PROTONIX) 40 MG tablet Take 1 tablet (40 mg total) by mouth daily as needed. 30 tablet 11   ??? predniSONE (DELTASONE) 5 MG tablet Take 0.5 tablets (2.5 mg total) by mouth daily for 7 days. Stop on 10/18/18 30 tablet 11   ??? sertraline (ZOLOFT) 25 MG tablet Take 1 tablet (25 mg total) by mouth daily. 30 tablet 11   ??? sulfamethoxazole-trimethoprim (BACTRIM) 400-80 mg per tablet Take 1 tablet (80 mg of trimethoprim total) by mouth 3 (three) times a week. 12 tablet 5   ??? tacrolimus (PROGRAF) 1 MG capsule Take 4 capsules (4 mg) by mouth in the morning and 3 capsules (3 mg) in the evening 210 capsule 11   ??? valGANciclovir (VALCYTE) 450 mg tablet Take 1 tablet (450 mg total) by mouth daily. 30 tablet 2       Allergies:  Allergies   Allergen Reactions   ??? Watermelon Flavor      Mouth itching       Family History:  Family History   Problem Relation Age of Onset   ??? Asthma Mother    ??? COPD Father    ??? Cancer Father    ??? Autoimmune disease Sister         AIH   ??? Heart disease Sister    ??? Colon cancer Maternal Grandfather        Social History:   Social History Tobacco Use   ??? Smoking status: Never Smoker   ??? Smokeless tobacco: Never Used   Substance Use Topics   ??? Alcohol use: Not Currently   ??? Drug use: Never       Review of Systems  10 systems were reviewed and are negative except as noted specifically in the HPI.    Objective  Vitals:   Temp:  [36.9 ??C] 36.9 ??C  Heart Rate:  [78] 78  Resp:  [14] 14  BP: (129)/(84) 129/84  MAP (mmHg):  [97] 97  SpO2:  [97 %] 97 %      Intake/Output last 24 hours:  No intake or output data in the 24 hours ending 10/12/18 1238    Physical Exam:    General: Older male, resting comfortably in no acute distress  Eyes: Sclera anicteric, EOM intact  ENT: Nares without discharge, moist mucous membranes, trachea midline   Cardiac: Regular rate and rhythm, soft systolic murmur  Pulmonary: Non labored breathing, stable on room air, lungs clear to auscultation bilaterally, no wheeze or crackles, mildly diminished at the bases   Abdomen: Soft, non-tender, mercedes incision with staples in place, c/d/I, JP drain w light SS output  Extremities: Warm and well perfused, no edema bilaterally   Neuro: Alert and oriented x3    Pertinent Diagnostic Tests:  All lab results last 24 hours:    Recent Results (from the past 24 hour(s))   Comprehensive metabolic panel    Collection Time: 10/12/18 10:32 AM   Result Value Ref Range    Sodium 136 135 - 145 mmol/L    Potassium 4.9 3.5 - 5.0 mmol/L    Chloride 102 98 - 107 mmol/L    Anion Gap 8 7 - 15 mmol/L  CO2 26.0 22.0 - 30.0 mmol/L    BUN 24 (H) 7 - 21 mg/dL    Creatinine 0.27 2.53 - 1.30 mg/dL    BUN/Creatinine Ratio 29     EGFR CKD-EPI Non-African American, Male >90 >=60 mL/min/1.3m2    EGFR CKD-EPI African American, Male >90 >=60 mL/min/1.69m2    Glucose 95 70 - 179 mg/dL    Calcium 9.4 8.5 - 66.4 mg/dL    Albumin 3.9 3.5 - 5.0 g/dL    Total Protein 6.8 6.5 - 8.3 g/dL    Total Bilirubin 1.0 0.0 - 1.2 mg/dL    AST 40 19 - 55 U/L    ALT 25 <50 U/L    Alkaline Phosphatase 98 38 - 126 U/L   Blood Gas, Venous Collection Time: 10/12/18 10:32 AM   Result Value Ref Range    Specimen Source Venous     FIO2 Venous Room Air     pH, Venous 7.36 7.32 - 7.43    pCO2, Ven 50 40 - 60 mm Hg    pO2, Ven <20 (L) 30 - 55 mm Hg    HCO3, Ven 27 22 - 27 mmol/L    Base Excess, Ven 2.3 (H) -2.0 - 2.0    O2 Saturation, Venous 27.4 (L) 40.0 - 85.0 %   Lactic Acid, Venous, Whole Blood    Collection Time: 10/12/18 10:32 AM   Result Value Ref Range    Lactate, Venous 1.8 0.5 - 1.8 mmol/L   Troponin I    Collection Time: 10/12/18 10:32 AM   Result Value Ref Range    Troponin I 0.064 (HH) <0.034 ng/mL   Pro-BNP    Collection Time: 10/12/18 10:32 AM   Result Value Ref Range    PRO-BNP 1,110.0 (H) 0.0 - 177.0 pg/mL   Lipase    Collection Time: 10/12/18 10:32 AM   Result Value Ref Range    Lipase 37 (L) 44 - 232 U/L   CBC w/ Differential    Collection Time: 10/12/18 10:32 AM   Result Value Ref Range    WBC 11.5 (H) 4.5 - 11.0 10*9/L    RBC 3.80 (L) 4.50 - 5.90 10*12/L    HGB 11.6 (L) 13.5 - 17.5 g/dL    HCT 40.3 (L) 47.4 - 53.0 %    MCV 96.4 80.0 - 100.0 fL    MCH 30.6 26.0 - 34.0 pg    MCHC 31.7 31.0 - 37.0 g/dL    RDW 25.9 (H) 56.3 - 15.0 %    MPV 6.9 (L) 7.0 - 10.0 fL    Platelet 386 150 - 440 10*9/L    Variable HGB Concentration Slight (A) Not Present    Neutrophils % 75.3 %    Lymphocytes % 10.0 %    Monocytes % 5.8 %    Eosinophils % 6.9 %    Basophils % 0.5 %    Absolute Neutrophils 8.7 (H) 2.0 - 7.5 10*9/L    Absolute Lymphocytes 1.2 (L) 1.5 - 5.0 10*9/L    Absolute Monocytes 0.7 0.2 - 0.8 10*9/L    Absolute Eosinophils 0.8 (H) 0.0 - 0.4 10*9/L    Absolute Basophils 0.1 0.0 - 0.1 10*9/L    Large Unstained Cells 1 0 - 4 %    Macrocytosis Slight (A) Not Present    Anisocytosis Slight (A) Not Present    Hypochromasia Marked (A) Not Present   Protime-INR    Collection Time: 10/12/18 10:32 AM   Result Value Ref Range  PT 13.7 (H) 10.2 - 13.1 sec    INR 1.19    APTT    Collection Time: 10/12/18 10:32 AM   Result Value Ref Range    APTT 29.6 25.3 - 37.1 sec    Heparin Correlation 0.2    Type and Screen    Collection Time: 10/12/18 10:32 AM   Result Value Ref Range    Antibody Screen NEG     Blood Type AB POS    Morphology Review    Collection Time: 10/12/18 10:32 AM   Result Value Ref Range    Smear Review Comments See Comment (A) Undefined   COVID-19 PCR    Collection Time: 10/12/18 10:35 AM    Specimen: Nasopharyngeal Swab   Result Value Ref Range    SARS-CoV-2 PCR Negative Negative   ECG 12 Lead    Collection Time: 10/12/18 10:44 AM   Result Value Ref Range    EKG Systolic BP  mmHg    EKG Diastolic BP  mmHg    EKG Ventricular Rate 78 BPM    EKG Atrial Rate 78 BPM    EKG P-R Interval 146 ms    EKG QRS Duration 88 ms    EKG Q-T Interval 370 ms    EKG QTC Calculation 421 ms    EKG Calculated P Axis 8 degrees    EKG Calculated R Axis -12 degrees    EKG Calculated T Axis -6 degrees    QTC Fredericia 403 ms       Imaging:  Xr Chest 1 View Portable    Result Date: 10/12/2018  EXAM: XR CHEST PORTABLE DATE: 10/12/2018 11:02 AM ACCESSION: [id number] UN DICTATED: 10/12/2018 11:04 AM INTERPRETATION LOCATION: [facility] CLINICAL INDICATION: 58 years old Male with SHORTNESS OF BREATH  COMPARISON: Chest radiograph 10/10/2018 TECHNIQUE: Portable Chest Radiograph. FINDINGS: Right medial lung base patchy airspace opacity. Elevated right hemidiaphragm. Small right pleural effusion, grossly similar to prior. Stable cardiomediastinal silhouette. Surgical drain in the right upper quadrant.     Right medial lung base patchy airspace opacity, may represent infection the appropriate clinical setting versus atelectasis. Stable small right pleural effusion.

## 2018-10-12 NOTE — Unmapped (Signed)
Received sign out from Dr. Joaquim Lai    ED I-PASS Handoff  ?? Illness Severit: stable  ?? Patient Summary: Anthony Alvarado is a 58 y.o. male with history of cholestatic cirrhosis of liver and hepatic encephalopathy s/p liver transplant on 09/16/2018, type II diabetes mellitus, bicuspid aortic valve stenosis, and asthma presenting today for evaluation of new onset fever this morning along with shortness of breath that has been ongoing for five days with an associated non-productive cough, fatigue, and generalized weakness.  Vital signs within normal limits.  On exam lungs are clear bilaterally, patient not tachycardic, regular rhythm, abdomen soft, nontender, well-healed surgical scars with JP drain in place.  Laboratory evaluation shows WBC 11.5, hemoglobin 11.6, COVID negative, proBNP elevated to 1110 from 450, creatinine 0.82, no transaminitis.  Lactic acid 1.8.  ?? Action List: Discussed case with transplant surgery, follow the recommendations, likely start antibiotics, question as to whether to start patient CT chest  ?? Situation Awareness (Contingency Planning): Anticipate admission.       12:15 PM  Spoke with transplant surgery.  They will admit patient.  They requested IV Levaquin.  They do not feel CT is indicated at this time.     Luther Redo, MD  Resident  10/12/18 [phone]

## 2018-10-12 NOTE — Unmapped (Signed)
Patient contacted primary coordinator with report of fever of 100.2. He states he has no other sx except the cont'd mild SOB, he mentioned at the clinic appt on Monday. He reports he has some urinary urgency now, but thinks that may be d/t the lasix he started daily. Per patient the output from his JP drain has significantly decreased since yesterday, requiring emptying only 3x daily now, although the drainage character does not seem to have changed.  Spoke to Dr.Desai who recommended patient come to [facility] ED now, since they would need to r/o covid. Relayed msg to patient, who agreed with plan.

## 2018-10-12 NOTE — Unmapped (Signed)
Pt coming in with a fever, started this morning, recently had a liver transplant, his coordinator told him to come in for evaluation. Also having some SOB and chest pain.

## 2018-10-13 DIAGNOSIS — R509 Fever, unspecified: Secondary | ICD-10-CM | POA: Diagnosis not present

## 2018-10-13 LAB — COMPREHENSIVE METABOLIC PANEL
ALBUMIN: 3.2 g/dL — ABNORMAL LOW (ref 3.5–5.0)
ALKALINE PHOSPHATASE: 99 U/L (ref 38–126)
ALT (SGPT): 24 U/L (ref <50)
ANION GAP: 7 mmol/L (ref 7–15)
AST (SGOT): 26 U/L (ref 19–55)
BILIRUBIN TOTAL: 0.9 mg/dL (ref 0.0–1.2)
BLOOD UREA NITROGEN: 22 mg/dL — ABNORMAL HIGH (ref 7–21)
BUN / CREAT RATIO: 24
CALCIUM: 8.7 mg/dL (ref 8.5–10.2)
CHLORIDE: 102 mmol/L (ref 98–107)
CO2: 27 mmol/L (ref 22.0–30.0)
CREATININE: 0.93 mg/dL (ref 0.70–1.30)
EGFR CKD-EPI AA MALE: >90 mL/min/{1.73_m2} (ref >=60)
EGFR CKD-EPI NON-AA MALE: >90 mL/min/{1.73_m2} (ref >=60)
GLUCOSE RANDOM: 74 mg/dL (ref 70–179)
PROTEIN TOTAL: 5.6 g/dL — ABNORMAL LOW (ref 6.5–8.3)
SODIUM: 136 mmol/L (ref 135–145)

## 2018-10-13 LAB — CBC
HEMATOCRIT: 31.7 % — ABNORMAL LOW (ref 41.0–53.0)
HEMOGLOBIN: 10.5 g/dL — ABNORMAL LOW (ref 13.5–17.5)
MEAN CORPUSCULAR HEMOGLOBIN: 30.8 pg (ref 26.0–34.0)
MEAN CORPUSCULAR VOLUME: 93.1 fL (ref 80.0–100.0)
PLATELET COUNT: 343 10*9/L (ref 150–440)
RED BLOOD CELL COUNT: 3.4 10*12/L — ABNORMAL LOW (ref 4.50–5.90)
RED CELL DISTRIBUTION WIDTH: 16.8 % — ABNORMAL HIGH (ref 12.0–15.0)
WBC ADJUSTED: 10.2 10*9/L (ref 4.5–11.0)

## 2018-10-13 LAB — PHOSPHORUS: Phosphate:MCnc:Pt:Ser/Plas:Qn:: 4.3

## 2018-10-13 LAB — PLATELET COUNT: Platelets:NCnc:Pt:Bld:Qn:Automated count: 343

## 2018-10-13 LAB — MAGNESIUM: Magnesium:MCnc:Pt:Ser/Plas:Qn:: 1.6

## 2018-10-13 LAB — CO2: Carbon dioxide:SCnc:Pt:Ser/Plas:Qn:: 27

## 2018-10-13 MED ORDER — PANTOPRAZOLE SODIUM 40 MG PO TBEC
40.00 | DELAYED_RELEASE_TABLET | ORAL | Status: DC
Start: ? — End: 2018-10-13

## 2018-10-13 MED ORDER — SERTRALINE HCL 25 MG PO TABS
25.00 | ORAL_TABLET | ORAL | Status: DC
Start: 2018-10-14 — End: 2018-10-13

## 2018-10-13 MED ORDER — GENERIC EXTERNAL MEDICATION
Status: DC
Start: ? — End: 2018-10-13

## 2018-10-13 MED ORDER — HEPARIN SODIUM (PORCINE) 5000 UNIT/ML IJ SOLN
5000.00 | INTRAMUSCULAR | Status: DC
Start: 2018-10-13 — End: 2018-10-13

## 2018-10-13 MED ORDER — LEVOFLOXACIN IN D5W 750 MG/150ML IV SOLN
750.00 | INTRAVENOUS | Status: DC
Start: 2018-10-14 — End: 2018-10-13

## 2018-10-13 MED ORDER — GENERIC EXTERNAL MEDICATION
137.00 | Status: DC
Start: 2018-10-14 — End: 2018-10-13

## 2018-10-13 MED ORDER — PREDNISONE 5 MG PO TABS
2.50 | ORAL_TABLET | ORAL | Status: DC
Start: 2018-10-14 — End: 2018-10-13

## 2018-10-13 MED ORDER — MYCOPHENOLATE SODIUM 360 MG PO TBEC
360.00 | DELAYED_RELEASE_TABLET | ORAL | Status: DC
Start: 2018-10-13 — End: 2018-10-13

## 2018-10-13 MED ORDER — CHOLECALCIFEROL 25 MCG (1000 UT) PO TABS
2000.00 | ORAL_TABLET | ORAL | Status: DC
Start: ? — End: 2018-10-13

## 2018-10-13 MED ORDER — TACROLIMUS 1 MG PO CAPS
3.00 | ORAL_CAPSULE | ORAL | Status: DC
Start: 2018-10-13 — End: 2018-10-13

## 2018-10-13 MED ORDER — QUINERVA 260 MG PO TABS
650.00 | ORAL_TABLET | ORAL | Status: DC
Start: ? — End: 2018-10-13

## 2018-10-13 MED ORDER — TACROLIMUS 1 MG PO CAPS
4.00 | ORAL_CAPSULE | ORAL | Status: DC
Start: 2018-10-14 — End: 2018-10-13

## 2018-10-13 MED ORDER — VALGANCICLOVIR HCL 450 MG PO TABS
450.00 | ORAL_TABLET | ORAL | Status: DC
Start: 2018-10-14 — End: 2018-10-13

## 2018-10-13 MED ORDER — ASPIRIN 81 MG PO CHEW
81.00 | CHEWABLE_TABLET | ORAL | Status: DC
Start: 2018-10-14 — End: 2018-10-13

## 2018-10-13 MED ORDER — SULFAMETHOXAZOLE-TRIMETHOPRIM 400-80 MG PO TABS
1.00 | ORAL_TABLET | ORAL | Status: DC
Start: ? — End: 2018-10-13

## 2018-10-13 MED ORDER — LEVOFLOXACIN 750 MG TABLET
ORAL_TABLET | Freq: Every day | ORAL | 0 refills | 6 days | Status: CP
Start: 2018-10-13 — End: 2018-10-19
  Filled 2018-10-13: qty 6, 6d supply, fill #0

## 2018-10-13 MED ORDER — CHOLECALCIFEROL (VITAMIN D3) 25 MCG (1,000 UNIT) TABLET: 2000 [IU] | tablet | Freq: Every day | 11 refills | 30 days

## 2018-10-13 MED FILL — LEVOFLOXACIN 750 MG TABLET: 6 days supply | Qty: 6 | Fill #0 | Status: AC

## 2018-10-13 NOTE — Unmapped (Signed)
Pharmacist Discharge Note  Patient Name: Anthony Alvarado  Reason for admission: Possible infection  Reason for writing this note: new diagnosis with new medication    Highlighted medication changes with rationale (if applicable):  New Medications:  -Levofloxacin 750 mg daily for a total of 7 days (end date: 10/18/18)    Discontinued Medications:  -Furosemide 20 mg daily    Medication access:  - No barriers identified    Outpatient follow-up:  [ ]  Tacrolimus levels and medication adjustments as necessary   [ ]  Volume status and need for diuretics in the future    Crista Curb, PharmD, BCPS   Clinical Pharmacist    Future Appointments   Date Time Provider Department Center   10/17/2018  8:30 AM LAB PHLEB [facility]   10/17/2018  9:00 AM Bertram Denver, FNP [facility]   12/19/2018  9:10 AM LAB PHLEB [facility]   12/19/2018  9:40 AM Wallace Cullens Mincemoyer, CPP [facility]   12/19/2018 10:30 AM Lanelle Bal, RD/LDN nutr [facility]   12/19/2018 11:45 AM [facility] MRI RM 2 [facility] [place]   12/19/2018 12:45 PM [facility] DIAG RM 11 [facility] [place]   12/19/2018  1:45 PM Chirag Lanney Gins, MD [facility]   03/20/2019  8:30 AM Grover Canavan Yevonne Aline, PhD [facility]   03/20/2019 10:20 AM Wallace Cullens Mincemoyer, CPP [facility]   03/20/2019 11:00 AM Lanelle Bal, RD/LDN nutr [facility]   03/20/2019  1:45 PM Chirag Lanney Gins, MD [facility]   06/19/2019 12:45 PM Chirag Lanney Gins, MD [facility]   06/19/2019  1:00 PM Wallace Cullens Mincemoyer, CPP [facility]

## 2018-10-13 NOTE — Unmapped (Signed)
VSS, afebrile, no complaints of pain, nausea, or vomiting. Voiding, one bowel movement this shift.   Mercedes incision clean, dry, intact, with staples and open to air.  JP removed at bedside by MD. PO Levaquin ordered for d/c   Discharge instructions reviewed. All questions answered at this time.    Problem: Adult Inpatient Plan of Care  Goal: Plan of Care Review  Outcome: Resolved  Goal: Patient-Specific Goal (Individualization)  Outcome: Resolved  Goal: Absence of Hospital-Acquired Illness or Injury  Outcome: Resolved  Goal: Optimal Comfort and Wellbeing  Outcome: Resolved  Goal: Readiness for Transition of Care  Outcome: Resolved  Goal: Rounds/Family Conference  Outcome: Resolved     Problem: Diabetes Comorbidity  Goal: Blood Glucose Level Within Desired Range  Outcome: Resolved     Problem: Hypertension Comorbidity  Goal: Blood Pressure in Desired Range  Outcome: Resolved     Problem: Wound  Goal: Optimal Wound Healing  Outcome: Resolved

## 2018-10-13 NOTE — Unmapped (Signed)
Discharge Summary    Admit date: 10/12/2018    Discharge date and time: 10/13/18    Discharge to:  Home    Discharge Service: Surg Transplant [facility])    Discharge Attending Physician: Phillips Grout Des*    Discharge  Diagnoses: Fever    OR Procedures:  None    Ancillary Procedures: no procedures    Discharge Day Services:   The patient was seen and examined by the Transplant team on the day of discharge. Vital signs and laboratory values were assessed. Surgical wounds were inspected. Abdomina drain was removed. Discharge plan was discussed, instructions were given, and all questions answered.    Subjective   No acute events overnight. No fever or chills. Denies shortness of breath or chest pain.     Objective   Patient Vitals for the past 8 hrs:   BP Temp Temp src Pulse Resp SpO2 Weight   10/13/18 1207 122/80 36.3 ??C Oral 73 18 98 % ???   10/13/18 0821 114/74 36.3 ??C Oral 69 18 99 % 77.4 kg (170 lb 9.6 oz)     I/O this shift:  In: 470 [P.O.:320; IV Piggyback:150]  Out: 283 [Urine:280; Drains:3]    Physical Exam:  ??  General: Older male, resting comfortably in no acute distress  Eyes: Sclera anicteric, EOM intact  ENT: Nares without discharge, moist mucous membranes, trachea midline   Cardiac: Regular rate and rhythm, soft systolic murmur  Pulmonary: Non labored breathing, stable on room air, lungs clear to auscultation bilaterally, no wheeze or crackles  Abdomen: Soft, non-tender, mercedes incision with staples in place, c/d/I, drain site with dressing in place  Extremities: Warm and well perfused, no edema bilaterally   Neuro: Alert and oriented x3      Hospital Course:  Anthony Alvarado is a 58 y.o. male with history of severe aortic valve stenosis, HTN, DMII, OSA and Autoimmune cholangitis, carrier of hemochromatosis HFE gene mutation, cholestatic cirrhosis, hepatic encephalopathy now s/p OLT 09/16/18, discharged 7/31 who presents to [facility] ED on 10/12/18 due to low grade fevers (100.2) , shortness of breath and fatigue.     He underwent full infectious workup/evaluation which was unremarkable apart from a leukocytosis (11.5) and CXR with mild concern for an early pneumonia. COVID negative 10/12/18. He was treated with IV Levaquin. He remained stable on room air, afebrile with resolved shortness of breath on HD1. He was transitioned to a PO course of Levaquin for a 7 day course. His previous abdominal drain from surgery was removed on 10/13/18. He is being discharged home in good condition on 10/13/18 with close outpatient follow up.       Condition at Discharge: Improved  Discharge Medications:      Medication List      START taking these medications    ??? levoFLOXacin 750 MG tablet; Commonly known as: LEVAQUIN; Take 1 tablet   (750 mg total) by mouth daily for 6 days.     CHANGE how you take these medications    ??? cholecalciferol (vitamin D3) 1,000 unit (25 mcg) tablet; Take 2 tablets   (2,000 Units total) by mouth daily.; Start taking on: November 23, 2018;   What changed: These instructions start on November 23, 2018. If you are   unsure what to do until then, ask your doctor or other care provider.     CONTINUE taking these medications    ??? acetaminophen 325 MG tablet; Commonly known as: TylenoL; Take 2 tablets   (650 mg total)  by mouth every six (6) hours as needed for pain or fever (>   38C or 100.64F).  ??? aspirin 81 MG tablet; Commonly known as: ECOTRIN; Take 1 tablet (81 mg   total) by mouth daily.  ??? azelastine 137 mcg (0.1 %) nasal spray; Commonly known as: ASTELIN  ??? blood sugar diagnostic Strp; Use as directed Three (3) times a day   before meals.  ??? calcium carbonate 200 mg calcium (500 mg) chewable tablet; Commonly   known as: TUMS  ??? ergocalciferol 1,250 mcg (50,000 unit) capsule; Commonly known as:   DRISDOL; Take 1 capsule (50,000 Units total) by mouth once a week for 8   doses.  ??? EUCRISA 2 % Oint; Generic drug: crisaborole  ??? fluticasone propionate 50 mcg/actuation nasal spray; Commonly known as:   FLONASE ??? glipiZIDE 5 MG 24 hr tablet; Commonly known as: GLUCOTROL XL; Take 1   tablet (5 mg total) by mouth daily.  ??? lancets 33 gauge Misc; 1 each by Miscellaneous route Three (3) times a   day before meals.  ??? levothyroxine 150 MCG tablet; Commonly known as: SYNTHROID; Take 1   tablet (150 mcg total) by mouth daily.  ??? magnesium (amino acid chelate) 133 mg Tab; Generic drug: magnesium   oxide-Mg AA chelate; Take 1 tablet by mouth Two (2) times a day. HOLD   until directed to start by your coordinator.  ??? metroNIDAZOLE 1 % gel; Commonly known as: METROGEL  ??? mycophenolate 180 MG EC tablet; Commonly known as: MYFORTIC; Take 2   tablets (360 mg total) by mouth Two (2) times a day.  ??? oxyCODONE 5 MG immediate release tablet; Commonly known as: ROXICODONE;   Take 1-2 tablets (5-10 mg total) by mouth every six (6) hours as needed   for pain.  ??? pantoprazole 40 MG tablet; Commonly known as: PROTONIX; Take 1 tablet   (40 mg total) by mouth daily as needed.  ??? predniSONE 5 MG tablet; Commonly known as: DELTASONE; Take 0.5 tablets   (2.5 mg total) by mouth daily for 7 days. Stop on 10/18/18  ??? sertraline 25 MG tablet; Commonly known as: ZOLOFT; Take 1 tablet (25 mg   total) by mouth daily.  ??? sulfamethoxazole-trimethoprim 400-80 mg per tablet; Commonly known as:   BACTRIM; Take 1 tablet (80 mg of trimethoprim total) by mouth 3 (three)   times a week.  ??? tacrolimus 1 MG capsule; Commonly known as: PROGRAF; Take 4 capsules (4   mg) by mouth in the morning and 3 capsules (3 mg) in the evening  ??? valGANciclovir 450 mg tablet; Commonly known as: VALCYTE; Take 1 tablet   (450 mg total) by mouth daily.     STOP taking these medications    ??? furosemide 20 MG tablet; Commonly known as: LASIX         Discharge Instructions:  Other Instructions:  Other Instructions     Discharge instructions      Activity: Resume heavy lifting only after being cleared to do so at follow-up appointment in Transplant Surgery clinic.    Diet: Regular    Contact your transplant coordinator or the Transplant Surgery Office [phone]) during business hours or page the transplant coordinator on call [phone]) after business hours for:    - fever >100.5 degrees F by mouth, any fever with shaking chills, or other signs or symptoms of infection   - uncontrolled nausea, vomiting, or diarrhea; inability to have a bowel movement for > 3 days.   -  any problem that prevents taking medications as scheduled.   - pain uncontrolled with prescribed medication or new pain or tenderness at the surgical site   - sudden weight gain or increase in blood pressure (greater than 140/85)   - shortness of breath, chest pain / discomfort   - new or increasing jaundice   - urinary symptoms including pain / difficulty / burning or tea-colored urine   - any other new or concerning symptoms   - questions regarding your medications or continuing care             Labs and Other Follow-ups after Discharge:  Follow Up instructions and Outpatient Referrals     Discharge instructions            Future Appointments:  Appointments which have been scheduled for you    Oct 17, 2018  8:30 AM  (Arrive by 8:00 AM)  LAB ONLY with LAB PHLEB GRND UNCW  LAB PHLEB GRND FLR [employer] [facility] REGION) [address]  [place] [address]  [phone]      Oct 17, 2018  9:00 AM  (Arrive by 8:30 AM)  RETURN  30 with Bertram Denver, FNP  [facility] TRANSPLANT SURGERY [place] [facility] REGION) [address]  [place] [address]  [phone]      Dec 19, 2018  9:10 AM  (Arrive by 8:40 AM)  LAB ONLY with LAB PHLEB GRND UNCW  LAB PHLEB GRND FLR [employer] [facility] REGION) [address]  [place] [address]  [phone]      Dec 19, 2018  9:40 AM  (Arrive by 9:10 AM)  RETURN PHARMD with Fleeta Emmer, CPP  [facility] TRANSPLANT SURGERY [place] [facility] REGION) [address]  [place] [address]  [phone]      Dec 19, 2018 10:30 AM  (Arrive by 10:00 AM)  VIDEO VISIT- OTHER with Lanelle Bal, RD/LDN  [facility] NUTRITION SERVICES TRANSPLANT [place] [facility] REGION) [address]  [place] [address]  [phone]      Dec 19, 2018 11:45 AM  (Arrive by 11:15 AM)  MRI ABDOMEN W WO CONTRA    -UN with [facility] MRI RM 2  IMG MRI [facility]) [address]  [place] [address]  [phone]   On appt date:  Bring recent lab work  Bring documentation of any metal object implants  Take meds as usual  Check w/physician if diabetic  You will be asked to change into a gown for your safety    On appt date do not:  Consume anything 2 hrs  Wear metallic items including jewelry (we are not responsible for lost items)    Let us know if pt:  Claustrophobic  Metal object implant  Pregnant  Prescribed a sedative  On dialysis  Allergic to MRI dye/contrast  Kidney Failure    (Title:[id number])     Dec 19, 2018 12:45 PM  (Arrive by 12:15 PM)  XR ABDOMEN 1 VIEW with Toney Reil RM 11  IMG DIAG X-RAY [place] [facility]) [address]  [place] [address]  [phone]      Dec 19, 2018  1:45 PM  (Arrive by 1:15 PM)  RETURN 15 with Chirag Lanney Gins, MD  [facility] TRANSPLANT SURGERY [place] [facility] REGION) [address]  [place] [address]  [phone]  Mar 20, 2019  8:30 AM  (Arrive by 8:00 AM)  VIDEO VISIT- OTHER with Adalberto Cole, PhD  [facility] TRANSPLANT SURGERY [place] [facility] REGION) [address]  [place] [address]  [phone]      Mar 20, 2019 10:20 AM  (Arrive by 9:50 AM)  RETURN PHARMD with Fleeta Emmer, CPP  [facility] TRANSPLANT SURGERY [facility] REGION) [address]  [place] [address]  [phone]      Mar 20, 2019 11:00 AM  (Arrive by 10:30 AM)  VIDEO VISIT- OTHER with Lanelle Bal, RD/LDN  [facility] NUTRITION SERVICES TRANSPLANT [place] [facility] REGION) [address]  [place] [address]  [phone]      Mar 20, 2019  1:45 PM  (Arrive by 1:15 PM)  RETURN 15 with Chirag Lanney Gins, MD  [facility] TRANSPLANT SURGERY [place] [facility] REGION) [address]  [place] [address]  [phone]      Jun 19, 2019 12:45 PM  (Arrive by 12:15 PM)  RETURN 15 with Chirag Lanney Gins, MD  [facility] TRANSPLANT SURGERY [place] REGION) [address]  [place] [address]  [phone]      Jun 19, 2019  1:00 PM  (Arrive by 12:30 PM)  RETURN PHARMD with Fleeta Emmer, CPP  [facility] TRANSPLANT SURGERY [place] [facility] REGION) [address]  [place] [address]  [phone]

## 2018-10-13 NOTE — Unmapped (Signed)
Plan of care reviewed at beginning of shift and as needed. Tolerating regular diet, without complaint of nausea or emesis. Pain currently well controlled with PO PRN medications. VSS; afebrile. Uses CPAP at night. Ambulates independently; remains free of falls and injuries. Adequate fluid intake and urine output to this point in shift. All questions and concerns answered as they arise. Currently lying in bed, NAD, call bell within reach. Will continue to monitor.     Problem: Adult Inpatient Plan of Care  Goal: Patient-Specific Goal (Individualization)  Flowsheets (Taken 10/13/2018 0436)  Patient-Specific Goals (Include Timeframe): Patient will remain free of falls and injuries through time of discharge  Individualized Care Needs: Prefers to keep door closed at all times

## 2018-10-14 LAB — CBC W/ DIFFERENTIAL
BANDED NEUTROPHILS ABSOLUTE COUNT: 0.2 10*3/uL — ABNORMAL HIGH (ref 0.0–0.1)
BASOPHILS ABSOLUTE COUNT: 0.1 10*3/uL (ref 0.0–0.2)
BASOPHILS RELATIVE PERCENT: 1 %
EOSINOPHILS ABSOLUTE COUNT: 0.8 10*3/uL — ABNORMAL HIGH (ref 0.0–0.4)
EOSINOPHILS RELATIVE PERCENT: 9 %
HEMATOCRIT: 30.8 % — ABNORMAL LOW (ref 37.5–51.0)
HEMOGLOBIN: 10.1 g/dL — ABNORMAL LOW (ref 13.0–17.7)
IMMATURE GRANULOCYTES: 2 %
LYMPHOCYTES ABSOLUTE COUNT: 1.2 10*3/uL (ref 0.7–3.1)
LYMPHOCYTES RELATIVE PERCENT: 14 %
MEAN CORPUSCULAR HEMOGLOBIN: 29.5 pg (ref 26.6–33.0)
MONOCYTES ABSOLUTE COUNT: 0.9 10*3/uL (ref 0.1–0.9)
MONOCYTES RELATIVE PERCENT: 10 %
NEUTROPHILS ABSOLUTE COUNT: 5.9 10*3/uL (ref 1.4–7.0)
NEUTROPHILS RELATIVE PERCENT: 64 %
PLATELET COUNT: 345 10*3/uL (ref 150–450)
RED BLOOD CELL COUNT: 3.42 x10E6/uL — ABNORMAL LOW (ref 4.14–5.80)
RED CELL DISTRIBUTION WIDTH: 15 % (ref 11.6–15.4)

## 2018-10-14 LAB — MEAN CORPUSCULAR VOLUME: Erythrocyte mean corpuscular volume:EntVol:Pt:RBC:Qn:Automated count: 90

## 2018-10-14 NOTE — Unmapped (Signed)
Anthony Alvarado is a 58 y.o. male with history of severe aortic valve stenosis, HTN, DMII, OSA and Autoimmune cholangitis, carrier of hemochromatosis HFE gene mutation, cholestatic cirrhosis, hepatic encephalopathy now s/p OLT 09/16/18, discharged 7/31 who presents to [facility] ED on 10/12/18 due to low grade fevers (100.2) , shortness of breath and fatigue.     He underwent full infectious workup/evaluation which was unremarkable apart from a leukocytosis (11.5) and CXR with mild concern for an early pneumonia. COVID negative 10/12/18. He was treated with IV Levaquin. He remained stable on room air, afebrile with resolved shortness of breath on HD1. He was transitioned to a PO course of Levaquin for a 7 day course. His previous abdominal drain from surgery was removed on 10/13/18. He is being discharged home in good condition on 10/13/18 with close outpatient follow up.

## 2018-10-15 LAB — PHOSPHORUS, SERUM: Phosphate:MCnc:Pt:Ser/Plas:Qn:: 4

## 2018-10-15 LAB — COMPREHENSIVE METABOLIC PANEL
ALBUMIN: 3.8 g/dL (ref 3.8–4.9)
ALKALINE PHOSPHATASE: 121 IU/L — ABNORMAL HIGH (ref 39–117)
ALT (SGPT): 21 IU/L (ref 0–44)
AST (SGOT): 17 IU/L (ref 0–40)
BILIRUBIN TOTAL: 0.8 mg/dL (ref 0.0–1.2)
BLOOD UREA NITROGEN: 21 mg/dL (ref 6–24)
BUN / CREAT RATIO: 16 (ref 9–20)
CALCIUM: 9.3 mg/dL (ref 8.7–10.2)
CHLORIDE: 102 mmol/L (ref 96–106)
CO2: 23 mmol/L (ref 20–29)
CREATININE: 1.28 mg/dL — ABNORMAL HIGH (ref 0.76–1.27)
SODIUM: 141 mmol/L (ref 134–144)
TOTAL PROTEIN: 5.6 g/dL — ABNORMAL LOW (ref 6.0–8.5)

## 2018-10-15 LAB — CALCIUM: Calcium:MCnc:Pt:Ser/Plas:Qn:: 9.3

## 2018-10-15 LAB — BILIRUBIN DIRECT: Bilirubin.glucuronidated+Bilirubin.albumin bound:MCnc:Pt:Ser/Plas:Qn:: 0.35

## 2018-10-15 LAB — GAMMA GLUTAMYL TRANSFERASE: Gamma glutamyl transferase:CCnc:Pt:Ser/Plas:Qn:: 80 — ABNORMAL HIGH

## 2018-10-15 LAB — MAGNESIUM: Magnesium:MCnc:Pt:Ser/Plas:Qn:: 2

## 2018-10-16 LAB — TACROLIMUS BLOOD: Tacrolimus:MCnc:Pt:Bld:Qn:LC/MS/MS: 12.2

## 2018-10-17 ENCOUNTER — Encounter: Admit: 2018-10-17 | Discharge: 2018-10-17 | Payer: MEDICARE

## 2018-10-17 ENCOUNTER — Non-Acute Institutional Stay: Admit: 2018-10-17 | Discharge: 2018-10-17 | Payer: MEDICARE

## 2018-10-17 DIAGNOSIS — Z944 Liver transplant status: Principal | ICD-10-CM

## 2018-10-17 DIAGNOSIS — Z4802 Encounter for removal of sutures: Secondary | ICD-10-CM

## 2018-10-17 DIAGNOSIS — E612 Magnesium deficiency: Secondary | ICD-10-CM

## 2018-10-17 DIAGNOSIS — Z79899 Other long term (current) drug therapy: Secondary | ICD-10-CM

## 2018-10-17 DIAGNOSIS — Z5181 Encounter for therapeutic drug level monitoring: Secondary | ICD-10-CM

## 2018-10-17 DIAGNOSIS — Z9189 Other specified personal risk factors, not elsewhere classified: Secondary | ICD-10-CM

## 2018-10-17 DIAGNOSIS — D899 Disorder involving the immune mechanism, unspecified: Secondary | ICD-10-CM

## 2018-10-17 DIAGNOSIS — Z4823 Encounter for aftercare following liver transplant: Secondary | ICD-10-CM | POA: Diagnosis not present

## 2018-10-17 LAB — COMPREHENSIVE METABOLIC PANEL
ALBUMIN: 3.8 g/dL (ref 3.5–5.0)
ALKALINE PHOSPHATASE: 105 U/L (ref 38–126)
ALT (SGPT): 20 U/L (ref <50)
ANION GAP: 10 mmol/L (ref 7–15)
AST (SGOT): 25 U/L (ref 19–55)
BILIRUBIN TOTAL: 0.6 mg/dL (ref 0.0–1.2)
BLOOD UREA NITROGEN: 32 mg/dL — ABNORMAL HIGH (ref 7–21)
BUN / CREAT RATIO: 27
CALCIUM: 9.9 mg/dL (ref 8.5–10.2)
CHLORIDE: 104 mmol/L (ref 98–107)
CO2: 25 mmol/L (ref 22.0–30.0)
CREATININE: 1.17 mg/dL (ref 0.70–1.30)
EGFR CKD-EPI AA MALE: 80 mL/min/{1.73_m2} (ref >=60)
POTASSIUM: 5 mmol/L (ref 3.5–5.0)
PROTEIN TOTAL: 6.4 g/dL — ABNORMAL LOW (ref 6.5–8.3)
SODIUM: 139 mmol/L (ref 135–145)

## 2018-10-17 LAB — CBC W/ AUTO DIFF
BASOPHILS ABSOLUTE COUNT: 0.2 10*9/L — ABNORMAL HIGH (ref 0.0–0.1)
BASOPHILS RELATIVE PERCENT: 1.6 %
EOSINOPHILS ABSOLUTE COUNT: 0.7 10*9/L — ABNORMAL HIGH (ref 0.0–0.4)
EOSINOPHILS RELATIVE PERCENT: 5.8 %
HEMATOCRIT: 35.6 % — ABNORMAL LOW (ref 41.0–53.0)
HEMOGLOBIN: 11.3 g/dL — ABNORMAL LOW (ref 13.5–17.5)
LYMPHOCYTES ABSOLUTE COUNT: 1.7 10*9/L (ref 1.5–5.0)
LYMPHOCYTES RELATIVE PERCENT: 14.3 %
MEAN CORPUSCULAR HEMOGLOBIN CONC: 31.6 g/dL (ref 31.0–37.0)
MEAN CORPUSCULAR HEMOGLOBIN: 28.9 pg (ref 26.0–34.0)
MEAN PLATELET VOLUME: 7.4 fL (ref 7.0–10.0)
MONOCYTES ABSOLUTE COUNT: 0.7 10*9/L (ref 0.2–0.8)
MONOCYTES RELATIVE PERCENT: 5.9 %
NEUTROPHILS ABSOLUTE COUNT: 8.2 10*9/L — ABNORMAL HIGH (ref 2.0–7.5)
NEUTROPHILS RELATIVE PERCENT: 71 %
PLATELET COUNT: 434 10*9/L (ref 150–440)
RED CELL DISTRIBUTION WIDTH: 16.7 % — ABNORMAL HIGH (ref 12.0–15.0)
WBC ADJUSTED: 11.6 10*9/L — ABNORMAL HIGH (ref 4.5–11.0)

## 2018-10-17 LAB — HEPATITIS B SURFACE ANTIGEN: Hepatitis B virus surface Ag:PrThr:Pt:Ser:Ord:: NONREACTIVE

## 2018-10-17 LAB — PHOSPHORUS: Phosphate:MCnc:Pt:Ser/Plas:Qn:: 4.9 — ABNORMAL HIGH

## 2018-10-17 LAB — MAGNESIUM: Magnesium:MCnc:Pt:Ser/Plas:Qn:: 1.7

## 2018-10-17 LAB — BILIRUBIN, DIRECT: BILIRUBIN DIRECT: 0.2 mg/dL (ref 0.00–0.40)

## 2018-10-17 LAB — HIV ANTIGEN/ANTIBODY COMBO: HIV 1+2 Ab+HIV1 p24 Ag:PrThr:Pt:Ser/Plas:Ord:IA: NONREACTIVE

## 2018-10-17 LAB — RED BLOOD CELL COUNT: Lab: 3.9 — ABNORMAL LOW

## 2018-10-17 LAB — ALKALINE PHOSPHATASE: Alkaline phosphatase:CCnc:Pt:Ser/Plas:Qn:: 105

## 2018-10-17 LAB — TACROLIMUS, TROUGH: Lab: 10.6

## 2018-10-17 LAB — BILIRUBIN DIRECT: Bilirubin.glucuronidated+Bilirubin.albumin bound:MCnc:Pt:Ser/Plas:Qn:: 0.2

## 2018-10-17 LAB — GAMMA GLUTAMYL TRANSFERASE: Gamma glutamyl transferase:CCnc:Pt:Ser/Plas:Qn:: 100

## 2018-10-17 MED ORDER — TACROLIMUS 0.5 MG CAPSULE
ORAL_CAPSULE | 2 refills | 0 days | Status: CP
Start: 2018-10-17 — End: ?
  Filled 2018-10-20: qty 390, 30d supply, fill #0

## 2018-10-17 NOTE — Unmapped (Signed)
TRANSPLANT SURGERY FOLLOW UP CLINIC NOTE     Assessment and Plan:   Anthony Alvarado is a 58 y.o. male who underwent OLT on 09/16/2018 for cryptogenic cirrhosis with [facility] who presents for hospital follow up for recent diagnosis and treatment for early pneumonia.    # Immunosuppression:   - Tacrolimus reduce to 3.5mg  and 3mg  from 4mg  BID with (goal trough 8-10 ng/mL)  - Myfortic 360 mg BID    # Volume overload  - still some consolidation and reduced breath sounds to RLL  - Encouraged high protein, low sodium diet  - continue incentive spirometry and exercise as patient reports breathing is much easier  - no worsening ascites or indication for diuresis at this point    I spent a significant portion of time with patient and partner Tammy discussing viable HCC on explant and the risks of this. I explained our monitoring procedures for [facility] including MRI/AFP and they understand the plan. He is concerned about claustrophobia and MRI's but reports will not be a problem if Adequately medicated.    Follow up: 1 week for remainder of staple removal and close monitoring of fluid status  Labs: continue 3x weekly, if tacrolimus trough stable can transition to twice weekly    History of Present Illness:   Anthony Alvarado is a 57 y.o. male with cryptogenic cirrhosis and HCC s/p OLT 09/16/2018.  PMH additionally notable for severe aortic valve stenosis, HTN, DMII, OSA and Autoimmune cholangitis, carrier of hemochromatosis HFE gene mutation.  Post-operative course was relatively uncomplicated but he has been followed in clinic with persistent ascites and recent ED evaluation 8/19 with pulmonary effusion fever and PNA. He completed a course of levaquin with improvement of symptoms and resolution of symptoms.    Of note explant remonstrated viable HC within segment 6 but without vascular, lymph, or bile duct invasion. There was also a high grade dysplastic nodule with cirrhotic changes in segment 5. Prior review showed PAS-positive globules potentially consistently with A1AT but PI typing showed PiMM. He denies acute complaint today, reports staples are pulling a bit. No N/V/D/C. Breathing easier at rest and with exertion.    Current Outpatient Medications   Medication Sig Dispense Refill   ??? acetaminophen (TYLENOL) 325 MG tablet Take 2 tablets (650 mg total) by mouth every six (6) hours as needed for pain or fever (> 38C or 100.13F). 100 tablet 0   ??? aspirin (ECOTRIN) 81 MG tablet Take 1 tablet (81 mg total) by mouth daily. 30 tablet 11   ??? azelastine (ASTELIN) 137 mcg (0.1 %) nasal spray 1 spray by Each Nare route daily as needed.      ??? blood sugar diagnostic Strp Use as directed Three (3) times a day before meals. 90 each 11   ??? calcium carbonate (TUMS) 200 mg calcium (500 mg) chewable tablet Chew 1 tablet daily as needed for heartburn.     ??? [START ON 11/23/2018] cholecalciferol, vitamin D3, 1,000 unit (25 mcg) tablet Take 2 tablets (2,000 Units total) by mouth daily. 60 tablet 11   ??? ergocalciferol (DRISDOL) 1,250 mcg (50,000 unit) capsule Take 1 capsule (50,000 Units total) by mouth once a week for 4 doses. 4 capsule 0   ??? EUCRISA 2 % Oint Apply 1 application topically daily as needed.      ??? fluticasone propionate (FLONASE) 50 mcg/actuation nasal spray PLACE ONE OR TWO SPRAYS INTO BOTH NOSTRILS DAILY AS NEEDED.     ??? glipiZIDE (GLUCOTROL XL) 5 MG 24  hr tablet Take 1 tablet (5 mg total) by mouth daily. 30 tablet 11   ??? lancets 33 gauge Misc 1 each by Miscellaneous route Three (3) times a day before meals. 100 each 11   ??? levoFLOXacin (LEVAQUIN) 750 MG tablet Take 1 tablet (750 mg total) by mouth daily for 6 days. 6 tablet 0   ??? levothyroxine (SYNTHROID) 150 MCG tablet Take 1 tablet (150 mcg total) by mouth daily. 30 tablet 11   ??? magnesium oxide-Mg AA chelate (MAGNESIUM, AMINO ACID CHELATE,) 133 mg Tab Take 1 tablet by mouth Two (2) times a day. HOLD until directed to start by your coordinator. (Patient not taking: Reported on 09/24/2018) 60 tablet 11   ??? metroNIDAZOLE (METROGEL) 1 % gel Apply 1 application topically daily as needed.      ??? mycophenolate (MYFORTIC) 180 MG EC tablet Take 2 tablets (360 mg total) by mouth Two (2) times a day. 120 tablet 11   ??? oxyCODONE (ROXICODONE) 5 MG immediate release tablet Take 1-2 tablets (5-10 mg total) by mouth every six (6) hours as needed for pain. 40 tablet 0   ??? pantoprazole (PROTONIX) 40 MG tablet Take 1 tablet (40 mg total) by mouth daily as needed. 30 tablet 11   ??? sertraline (ZOLOFT) 25 MG tablet Take 1 tablet (25 mg total) by mouth daily. 30 tablet 11   ??? sulfamethoxazole-trimethoprim (BACTRIM) 400-80 mg per tablet Take 1 tablet (80 mg of trimethoprim total) by mouth 3 (three) times a week. 12 tablet 5   ??? tacrolimus (PROGRAF) 0.5 MG capsule Take 7 capsules (3.5mg ) by mouth in the morning and 6 capsules (3mg ) in the evening. 390 capsule 2   ??? valGANciclovir (VALCYTE) 450 mg tablet Take 1 tablet (450 mg total) by mouth daily. 30 tablet 2     No current facility-administered medications for this visit.      Facility-Administered Medications Ordered in Other Visits   Medication Dose Route Frequency Provider Last Rate Last Dose   ??? diazePAM (VALIUM) 5 mg/mL injection                Physical Exam:  BP 129/87  - Pulse 67  - Temp 36.4 ??C (97.5 ??F) (Tympanic)  - Ht 172.7 cm (5' 8)  - Wt 77.7 kg (171 lb 6.4 oz)  - SpO2 96%  - BMI 26.06 kg/m??   General Appearance:  No acute distress, well appearing and well nourished.   Head:  Normocephalic, atraumatic.   Eyes:  No scleral icterus.   Pulmonary:    Normal respiratory effort. Decreased breath sounds at right base.   Cardiovascular:  Regular rate and rhythm.   Abdomen:   Abdominal wound with staples in place and well-healing; JP exit sites well healed.   Neurologic: Non-focal exam.         Lab Results   Component Value Date    WBC 9.1 10/14/2018    HGB 10.1 (L) 10/14/2018    HCT 30.8 (L) 10/14/2018    PLT 345 10/14/2018     Lab Results   Component Value Date    NA 141 10/14/2018    K 5.1 10/14/2018    CL 102 10/14/2018    CO2 23 10/14/2018    BUN 21 10/14/2018    CREATININE 1.28 (H) 10/14/2018    CALCIUM 9.3 10/14/2018    MG 2.0 10/14/2018    PHOS 4.3 10/13/2018      Lab Results   Component Value Date  ALKPHOS 121 (H) 10/14/2018    BILITOT 0.8 10/14/2018    BILIDIR 0.35 10/14/2018    PROT 5.6 (L) 10/14/2018    ALBUMIN 3.2 (L) 10/13/2018    ALT 21 10/14/2018    AST 17 10/14/2018    GGT 80 (H) 10/14/2018      Lab Results   Component Value Date    APTT 29.6 10/12/2018        IMAGING:  Xr Chest 1 View Portable    Result Date: 10/12/2018  EXAM: XR CHEST PORTABLE DATE: 10/12/2018 11:02 AM ACCESSION: [id number] UN DICTATED: 10/12/2018 11:04 AM INTERPRETATION LOCATION: Main [facility] CLINICAL INDICATION: 58 years old Male with SHORTNESS OF BREATH  COMPARISON: Chest radiograph 10/10/2018 TECHNIQUE: Portable Chest Radiograph. FINDINGS: Right medial lung base patchy airspace opacity. Elevated right hemidiaphragm. Small right pleural effusion, grossly similar to prior. Stable cardiomediastinal silhouette. Surgical drain in the right upper quadrant.     Right medial lung base patchy airspace opacity, may represent infection the appropriate clinical setting versus atelectasis. Stable small right pleural effusion.          Gertie Fey, DNP, APRN, FNP-C  [facility] for [facility]  [address]  [place], [place]  [address]

## 2018-10-17 NOTE — Unmapped (Signed)
Tacrolimus dose decrease   Test claim $0.00

## 2018-10-17 NOTE — Unmapped (Signed)
Patient seen briefly at clinic appt today for wound f/u post hospital d/c. He reports he continues to have some SOB, but it seems to be improving. He denies any c/o wound irritation or drainage. Some staples removed today. Patient's tac dose elevated from Friday-will reduce to 0.5 mg caps 3.5 mg in am and 3mg  at pm.. Sent script to patient's pharmacy. He will return next week for further f/u.

## 2018-10-17 NOTE — Unmapped (Signed)
[facility] Pharmacist Intervention    Type of intervention: dosage change    Medication: tacrolimus    Problem: new rx received at [facility] for tac 3.5/3    Intervention: per epic notes today, patient is aware of this change. Patient already has a second call attempt for clinical set up for this week. Will not adjust call date at this time    Follow up needed: n/a    Approximate time spent: 10 minutes    [name]   [facility] Specialty Pharmacist

## 2018-10-18 MED ORDER — ERGOCALCIFEROL (VITAMIN D2) 1,250 MCG (50,000 UNIT) CAPSULE
ORAL_CAPSULE | ORAL | 0 refills | 28.00000 days | Status: CP
Start: 2018-10-18 — End: 2018-11-17
  Filled 2018-10-20: qty 4, 28d supply, fill #0

## 2018-10-18 NOTE — Unmapped (Signed)
[facility] Clinical Assessment & Refill Coordination Note    Anthony Alvarado, DOB: 1960-03-14  Phone: [phone] (home)     All above HIPAA information was verified with patient.     Specialty Medication(s):   Transplant:  mycophenolic acid 180mg , tacrolimus 1mg  and valgancyclovir 450mg      Current Outpatient Medications   Medication Sig Dispense Refill   ??? acetaminophen (TYLENOL) 325 MG tablet Take 2 tablets (650 mg total) by mouth every six (6) hours as needed for pain or fever (> 38C or 100.61F). 100 tablet 0   ??? aspirin (ECOTRIN) 81 MG tablet Take 1 tablet (81 mg total) by mouth daily. 30 tablet 11   ??? azelastine (ASTELIN) 137 mcg (0.1 %) nasal spray 1 spray by Each Nare route daily as needed.      ??? blood sugar diagnostic Strp Use as directed Three (3) times a day before meals. 90 each 11   ??? calcium carbonate (TUMS) 200 mg calcium (500 mg) chewable tablet Chew 1 tablet daily as needed for heartburn.     ??? [START ON 11/23/2018] cholecalciferol, vitamin D3, 1,000 unit (25 mcg) tablet Take 2 tablets (2,000 Units total) by mouth daily. 60 tablet 11   ??? ergocalciferol (DRISDOL) 1,250 mcg (50,000 unit) capsule Take 1 capsule (50,000 Units total) by mouth once a week for 8 doses. 4 capsule 1   ??? EUCRISA 2 % Oint Apply 1 application topically daily as needed.      ??? fluticasone propionate (FLONASE) 50 mcg/actuation nasal spray PLACE ONE OR TWO SPRAYS INTO BOTH NOSTRILS DAILY AS NEEDED.     ??? glipiZIDE (GLUCOTROL XL) 5 MG 24 hr tablet Take 1 tablet (5 mg total) by mouth daily. 30 tablet 11   ??? lancets 33 gauge Misc 1 each by Miscellaneous route Three (3) times a day before meals. 100 each 11   ??? levoFLOXacin (LEVAQUIN) 750 MG tablet Take 1 tablet (750 mg total) by mouth daily for 6 days. 6 tablet 0   ??? levothyroxine (SYNTHROID) 150 MCG tablet Take 1 tablet (150 mcg total) by mouth daily. 30 tablet 11   ??? magnesium oxide-Mg AA chelate (MAGNESIUM, AMINO ACID CHELATE,) 133 mg Tab Take 1 tablet by mouth Two (2) times a day. HOLD until directed to start by your coordinator. (Patient not taking: Reported on 09/24/2018) 60 tablet 11   ??? metroNIDAZOLE (METROGEL) 1 % gel Apply 1 application topically daily as needed.      ??? mycophenolate (MYFORTIC) 180 MG EC tablet Take 2 tablets (360 mg total) by mouth Two (2) times a day. 120 tablet 11   ??? oxyCODONE (ROXICODONE) 5 MG immediate release tablet Take 1-2 tablets (5-10 mg total) by mouth every six (6) hours as needed for pain. 40 tablet 0   ??? pantoprazole (PROTONIX) 40 MG tablet Take 1 tablet (40 mg total) by mouth daily as needed. 30 tablet 11   ??? sertraline (ZOLOFT) 25 MG tablet Take 1 tablet (25 mg total) by mouth daily. 30 tablet 11   ??? sulfamethoxazole-trimethoprim (BACTRIM) 400-80 mg per tablet Take 1 tablet (80 mg of trimethoprim total) by mouth 3 (three) times a week. 12 tablet 5   ??? tacrolimus (PROGRAF) 0.5 MG capsule Take 7 capsules (3.5mg ) by mouth in the morning and 6 capsules (3mg ) in the evening. 390 capsule 2   ??? valGANciclovir (VALCYTE) 450 mg tablet Take 1 tablet (450 mg total) by mouth daily. 30 tablet 2     No  current facility-administered medications for this visit.      Facility-Administered Medications Ordered in Other Visits   Medication Dose Route Frequency Provider Last Rate Last Dose   ??? diazePAM (VALIUM) 5 mg/mL injection                 Changes to medications: [name] reports no changes at this time.    Allergies   Allergen Reactions   ??? Watermelon Flavor      Mouth itching       Changes to allergies: No    SPECIALTY MEDICATION ADHERENCE     Valganciclovir 450 mg: 4 days of medicine on hand   Mycophenolate 180 mg: 4 days of medicine on hand    Tacrolimus 1 mg: 8 days of medicine on hand   Tacrolimus 0.5 mg: 0 days of medicine on hand         Medication Adherence    Patient reported X missed doses in the last month: 1  Specialty Medication: Tacrolimus 1mg   Patient is on additional specialty medications: Yes  Additional Specialty Medications: Valganciclovir 450mg   Patient Reported Additional Medication X Missed Doses in the Last Month: 1  Patient is on more than two specialty medications: Yes  Specialty Medication: Mycophenolate 180mg   Patient Reported Additional Medication X Missed Doses in the Last Month: 1          Specialty medication(s) dose(s) confirmed: Regimen is correct and unchanged.     Are there any concerns with adherence? No    Adherence counseling provided? Not needed    CLINICAL MANAGEMENT AND INTERVENTION      Clinical Benefit Assessment:    Do you feel the medicine is effective or helping your condition? Yes    Clinical Benefit counseling provided? Not needed    Adverse Effects Assessment:    Are you experiencing any side effects? Yes, patient reports experiencing slight shakiness.. Side effect counseling provided: clinic is aware.    Are you experiencing difficulty administering your medicine? No    Quality of Life Assessment:    How many days over the past month did your liver transplant  keep you from your normal activities? For example, brushing your teeth or getting up in the morning. 0    Have you discussed this with your provider? Not needed    Therapy Appropriateness:    Is therapy appropriate? Yes, therapy is appropriate and should be continued    DISEASE/MEDICATION-SPECIFIC INFORMATION      N/A    PATIENT SPECIFIC NEEDS     ? Does the patient have any physical, cognitive, or cultural barriers? No    ? Is the patient high risk? Yes, patient taking a REMS drug     ? Does the patient require a Care Management Plan? No     ? Does the patient require physician intervention or other additional services (i.e. nutrition, smoking cessation, social work)? No      SHIPPING     Specialty Medication(s) to be Shipped:   Transplant:  mycophenolic acid 180mg , tacrolimus 0.5mg  and valgancyclovir 450mg     Other medication(s) to be shipped: smz-tmp, ergocalciferol     Changes to insurance: No    Delivery Scheduled: Yes, Expected medication delivery date: 10/21/2018.     Medication will be delivered via UPS to the confirmed home address in [facility].    The patient will receive a drug information handout for each medication shipped and additional FDA Medication Guides as required.  Verified that patient has previously  received a Conservation officer, historic buildings.    All of the patient's questions and concerns have been addressed.    [name]   [facility] Specialty Pharmacist

## 2018-10-18 NOTE — Unmapped (Signed)
Received refill request from [facility] for patient's vit D, the script for which had been initially sent to [facility]. Contacted patient who confirmed he had been taking his 1st month's worth but wanted to transfer the 2nd refill to [facility]. Sent 1 mo.refill to [facility].

## 2018-10-19 LAB — HEPATITIS C RNA, QUANTITATIVE, PCR: HCV RNA: NOT DETECTED

## 2018-10-19 LAB — HBV DNA COMMENT: Lab: 0

## 2018-10-19 LAB — HCV RNA LOG10: Lab: 0

## 2018-10-20 LAB — COMPREHENSIVE METABOLIC PANEL
A/G RATIO: 2.3 — ABNORMAL HIGH (ref 1.2–2.2)
ALBUMIN: 4.1 g/dL (ref 3.8–4.9)
ALKALINE PHOSPHATASE: 111 IU/L (ref 39–117)
ALT (SGPT): 12 IU/L (ref 0–44)
AST (SGOT): 12 IU/L (ref 0–40)
BILIRUBIN TOTAL: 0.5 mg/dL (ref 0.0–1.2)
BLOOD UREA NITROGEN: 30 mg/dL — ABNORMAL HIGH (ref 6–24)
BUN / CREAT RATIO: 24 — ABNORMAL HIGH (ref 9–20)
CALCIUM: 9.2 mg/dL (ref 8.7–10.2)
CHLORIDE: 105 mmol/L (ref 96–106)
CREATININE: 1.24 mg/dL (ref 0.76–1.27)
GLOBULIN, TOTAL: 1.8 g/dL (ref 1.5–4.5)
GLUCOSE: 87 mg/dL (ref 65–99)
SODIUM: 141 mmol/L (ref 134–144)
TOTAL PROTEIN: 5.9 g/dL — ABNORMAL LOW (ref 6.0–8.5)

## 2018-10-20 LAB — CBC W/ DIFFERENTIAL
BASOPHILS ABSOLUTE COUNT: 0.5 10*3/uL — ABNORMAL HIGH (ref 0.0–0.2)
BASOPHILS RELATIVE PERCENT: 4 %
EOSINOPHILS ABSOLUTE COUNT: 0.5 10*3/uL — ABNORMAL HIGH (ref 0.0–0.4)
EOSINOPHILS RELATIVE PERCENT: 4 %
HEMATOCRIT: 32.9 % — ABNORMAL LOW (ref 37.5–51.0)
HEMOGLOBIN: 10.4 g/dL — ABNORMAL LOW (ref 13.0–17.7)
LYMPHOCYTES ABSOLUTE COUNT: 2.3 10*3/uL (ref 0.7–3.1)
LYMPHOCYTES RELATIVE PERCENT: 20 %
MEAN CORPUSCULAR HEMOGLOBIN: 28.8 pg (ref 26.6–33.0)
MEAN CORPUSCULAR VOLUME: 91 fL (ref 79–97)
MONOCYTES ABSOLUTE COUNT: 0.8 10*3/uL (ref 0.1–0.9)
NEUTROPHILS ABSOLUTE COUNT: 6.9 10*3/uL (ref 1.4–7.0)
NEUTROPHILS RELATIVE PERCENT: 60 %
PLATELET COUNT: 308 10*3/uL (ref 150–450)
RED BLOOD CELL COUNT: 3.61 x10E6/uL — ABNORMAL LOW (ref 4.14–5.80)
RED CELL DISTRIBUTION WIDTH: 14.9 % (ref 11.6–15.4)
WHITE BLOOD CELL COUNT: 11.5 10*3/uL — ABNORMAL HIGH (ref 3.4–10.8)

## 2018-10-20 LAB — GAMMA GLUTAMYL TRANSFERASE: Gamma glutamyl transferase:CCnc:Pt:Ser/Plas:Qn:: 68 — ABNORMAL HIGH

## 2018-10-20 LAB — POTASSIUM: Potassium:SCnc:Pt:Ser/Plas:Qn:: 6 — ABNORMAL HIGH

## 2018-10-20 LAB — RED BLOOD CELL COUNT: Erythrocytes:NCnc:Pt:Bld:Qn:Automated count: 3.61 — ABNORMAL LOW

## 2018-10-20 LAB — MAGNESIUM: Magnesium:MCnc:Pt:Ser/Plas:Qn:: 1.7

## 2018-10-20 LAB — PHOSPHORUS, SERUM: Phosphate:MCnc:Pt:Ser/Plas:Qn:: 3.9

## 2018-10-20 LAB — BILIRUBIN DIRECT: Bilirubin.glucuronidated+Bilirubin.albumin bound:MCnc:Pt:Ser/Plas:Qn:: 0.3

## 2018-10-20 LAB — METAMYLOCYTES-LABCORP: Metamyelocytes/100 leukocytes:NFr:Pt:Bld:Qn:: 4 — ABNORMAL HIGH

## 2018-10-20 MED ORDER — SODIUM POLYSTYRENE SULFONATE 15 GRAM/60 ML ORAL SUSPENSION
Freq: Every day | ORAL | 0 refills | 2.00000 days
Start: 2018-10-20 — End: 2018-10-22

## 2018-10-20 MED FILL — VALGANCICLOVIR 450 MG TABLET: 30 days supply | Qty: 30 | Fill #0 | Status: AC

## 2018-10-20 MED FILL — TACROLIMUS 0.5 MG CAPSULE: 30 days supply | Qty: 390 | Fill #0 | Status: AC

## 2018-10-20 MED FILL — ERGOCALCIFEROL (VITAMIN D2) 1,250 MCG (50,000 UNIT) CAPSULE: 28 days supply | Qty: 4 | Fill #0 | Status: AC

## 2018-10-20 MED FILL — MYCOPHENOLATE SODIUM 180 MG TABLET,DELAYED RELEASE: ORAL | 30 days supply | Qty: 120 | Fill #1

## 2018-10-20 MED FILL — VALGANCICLOVIR 450 MG TABLET: ORAL | 30 days supply | Qty: 30 | Fill #0

## 2018-10-20 MED FILL — MYCOPHENOLATE SODIUM 180 MG TABLET,DELAYED RELEASE: 30 days supply | Qty: 120 | Fill #1 | Status: AC

## 2018-10-20 MED FILL — SULFAMETHOXAZOLE 400 MG-TRIMETHOPRIM 80 MG TABLET: 28 days supply | Qty: 12 | Fill #0 | Status: AC

## 2018-10-20 MED FILL — SULFAMETHOXAZOLE 400 MG-TRIMETHOPRIM 80 MG TABLET: ORAL | 28 days supply | Qty: 12 | Fill #0

## 2018-10-21 LAB — CBC W/ DIFFERENTIAL
BANDED NEUTROPHILS ABSOLUTE COUNT: 0.5 10*3/uL — ABNORMAL HIGH (ref 0.0–0.1)
BASOPHILS ABSOLUTE COUNT: 0.2 10*3/uL (ref 0.0–0.2)
BASOPHILS RELATIVE PERCENT: 2 %
EOSINOPHILS ABSOLUTE COUNT: 0.4 10*3/uL (ref 0.0–0.4)
EOSINOPHILS RELATIVE PERCENT: 3 %
HEMATOCRIT: 34 % — ABNORMAL LOW (ref 37.5–51.0)
IMMATURE GRANULOCYTES: 4 %
LYMPHOCYTES RELATIVE PERCENT: 17 %
MEAN CORPUSCULAR HEMOGLOBIN CONC: 32.9 g/dL (ref 31.5–35.7)
MEAN CORPUSCULAR HEMOGLOBIN: 29 pg (ref 26.6–33.0)
MEAN CORPUSCULAR VOLUME: 88 fL (ref 79–97)
MONOCYTES ABSOLUTE COUNT: 0.8 10*3/uL (ref 0.1–0.9)
MONOCYTES RELATIVE PERCENT: 8 %
NEUTROPHILS ABSOLUTE COUNT: 7.1 10*3/uL — ABNORMAL HIGH (ref 1.4–7.0)
NEUTROPHILS RELATIVE PERCENT: 66 %
PLATELET COUNT: 279 10*3/uL (ref 150–450)
RED BLOOD CELL COUNT: 3.86 x10E6/uL — ABNORMAL LOW (ref 4.14–5.80)
RED CELL DISTRIBUTION WIDTH: 14.7 % (ref 11.6–15.4)

## 2018-10-21 LAB — MEAN CORPUSCULAR VOLUME: Erythrocyte mean corpuscular volume:EntVol:Pt:RBC:Qn:Automated count: 88

## 2018-10-21 LAB — TACROLIMUS BLOOD: Tacrolimus:MCnc:Pt:Bld:Qn:LC/MS/MS: 8.8

## 2018-10-21 NOTE — Unmapped (Signed)
Patient's 8/26 lab results with elevated K. Per NP Martin-Velez, patient should have 30g kayexalate today and 30g tomorrow morning. Contacted several pharmacies and found inventory of med at [facility]. Gave verbal for script and alerted patient. He reported he continues to have some sensation of SOB and light-headedness, but no palpitations. Discussed importance of dietary modifications and hydration. He verbalized understanding and stated he would pickup sodium polystyrene today. Will see patient in clinic on Monday and provide dietetic list of high K foods.

## 2018-10-22 LAB — COMPREHENSIVE METABOLIC PANEL
A/G RATIO: 2.5 — ABNORMAL HIGH (ref 1.2–2.2)
ALBUMIN: 4.3 g/dL (ref 3.8–4.9)
ALKALINE PHOSPHATASE: 111 IU/L (ref 39–117)
ALT (SGPT): 11 IU/L (ref 0–44)
BILIRUBIN TOTAL: 0.6 mg/dL (ref 0.0–1.2)
BLOOD UREA NITROGEN: 27 mg/dL — ABNORMAL HIGH (ref 6–24)
BUN / CREAT RATIO: 21 — ABNORMAL HIGH (ref 9–20)
CALCIUM: 8.8 mg/dL (ref 8.7–10.2)
CHLORIDE: 106 mmol/L (ref 96–106)
CO2: 21 mmol/L (ref 20–29)
CREATININE: 1.26 mg/dL (ref 0.76–1.27)
GLOBULIN, TOTAL: 1.7 g/dL (ref 1.5–4.5)
GLUCOSE: 104 mg/dL — ABNORMAL HIGH (ref 65–99)
SODIUM: 141 mmol/L (ref 134–144)
TOTAL PROTEIN: 6 g/dL (ref 6.0–8.5)

## 2018-10-22 LAB — BILIRUBIN DIRECT: Bilirubin.glucuronidated+Bilirubin.albumin bound:MCnc:Pt:Ser/Plas:Qn:: 0.27

## 2018-10-22 LAB — PHOSPHORUS, SERUM: Phosphate:MCnc:Pt:Ser/Plas:Qn:: 4.4 — ABNORMAL HIGH

## 2018-10-22 LAB — SODIUM: Sodium:SCnc:Pt:Ser/Plas:Qn:: 141

## 2018-10-22 LAB — MAGNESIUM: Magnesium:MCnc:Pt:Ser/Plas:Qn:: 1.9

## 2018-10-22 LAB — GAMMA GLUTAMYL TRANSFERASE: Gamma glutamyl transferase:CCnc:Pt:Ser/Plas:Qn:: 59

## 2018-10-24 ENCOUNTER — Other Ambulatory Visit: Payer: BC Managed Care – PPO

## 2018-10-24 ENCOUNTER — Ambulatory Visit: Admit: 2018-10-24 | Discharge: 2018-10-24 | Payer: MEDICARE

## 2018-10-24 ENCOUNTER — Encounter: Admit: 2018-10-24 | Discharge: 2018-10-24 | Payer: MEDICARE

## 2018-10-24 DIAGNOSIS — Z944 Liver transplant status: Principal | ICD-10-CM

## 2018-10-24 DIAGNOSIS — E612 Magnesium deficiency: Secondary | ICD-10-CM

## 2018-10-24 DIAGNOSIS — Z5181 Encounter for therapeutic drug level monitoring: Secondary | ICD-10-CM

## 2018-10-24 DIAGNOSIS — Z4802 Encounter for removal of sutures: Secondary | ICD-10-CM

## 2018-10-24 DIAGNOSIS — R739 Hyperglycemia, unspecified: Secondary | ICD-10-CM

## 2018-10-24 DIAGNOSIS — Z9189 Other specified personal risk factors, not elsewhere classified: Secondary | ICD-10-CM

## 2018-10-24 DIAGNOSIS — D899 Disorder involving the immune mechanism, unspecified: Secondary | ICD-10-CM

## 2018-10-24 DIAGNOSIS — Z7982 Long term (current) use of aspirin: Secondary | ICD-10-CM | POA: Diagnosis not present

## 2018-10-24 DIAGNOSIS — E119 Type 2 diabetes mellitus without complications: Secondary | ICD-10-CM | POA: Diagnosis not present

## 2018-10-24 DIAGNOSIS — I10 Essential (primary) hypertension: Secondary | ICD-10-CM | POA: Diagnosis not present

## 2018-10-24 DIAGNOSIS — K7469 Other cirrhosis of liver: Secondary | ICD-10-CM | POA: Diagnosis not present

## 2018-10-24 DIAGNOSIS — Z79899 Other long term (current) drug therapy: Secondary | ICD-10-CM | POA: Diagnosis not present

## 2018-10-24 DIAGNOSIS — G4733 Obstructive sleep apnea (adult) (pediatric): Secondary | ICD-10-CM | POA: Diagnosis not present

## 2018-10-24 DIAGNOSIS — E8770 Fluid overload, unspecified: Secondary | ICD-10-CM | POA: Diagnosis not present

## 2018-10-24 DIAGNOSIS — Z7984 Long term (current) use of oral hypoglycemic drugs: Secondary | ICD-10-CM | POA: Diagnosis not present

## 2018-10-24 LAB — TACROLIMUS BLOOD: Tacrolimus:MCnc:Pt:Bld:Qn:LC/MS/MS: 9.9

## 2018-10-24 LAB — COMPREHENSIVE METABOLIC PANEL
ALBUMIN: 4.3 g/dL (ref 3.5–5.0)
ALKALINE PHOSPHATASE: 89 U/L (ref 38–126)
ALT (SGPT): 14 U/L (ref <50)
ANION GAP: 11 mmol/L (ref 7–15)
AST (SGOT): 21 U/L (ref 19–55)
BILIRUBIN TOTAL: 0.6 mg/dL (ref 0.0–1.2)
BLOOD UREA NITROGEN: 24 mg/dL — ABNORMAL HIGH (ref 7–21)
BUN / CREAT RATIO: 29
CALCIUM: 9.4 mg/dL (ref 8.5–10.2)
CHLORIDE: 109 mmol/L — ABNORMAL HIGH (ref 98–107)
CO2: 21 mmol/L — ABNORMAL LOW (ref 22.0–30.0)
CREATININE: 0.83 mg/dL (ref 0.70–1.30)
EGFR CKD-EPI AA MALE: >90 mL/min/{1.73_m2} (ref >=60)
EGFR CKD-EPI NON-AA MALE: >90 mL/min/{1.73_m2} (ref >=60)
POTASSIUM: 4.7 mmol/L (ref 3.5–5.0)
PROTEIN TOTAL: 6.7 g/dL (ref 6.5–8.3)
SODIUM: 141 mmol/L (ref 135–145)

## 2018-10-24 LAB — CBC W/ AUTO DIFF
BASOPHILS RELATIVE PERCENT: 1.7 %
EOSINOPHILS ABSOLUTE COUNT: 0.3 10*9/L (ref 0.0–0.4)
EOSINOPHILS RELATIVE PERCENT: 2.6 %
HEMATOCRIT: 38.1 % — ABNORMAL LOW (ref 41.0–53.0)
HEMOGLOBIN: 11.5 g/dL — ABNORMAL LOW (ref 13.5–17.5)
LARGE UNSTAINED CELLS: 2 % (ref 0–4)
LYMPHOCYTES ABSOLUTE COUNT: 1.9 10*9/L (ref 1.5–5.0)
LYMPHOCYTES RELATIVE PERCENT: 16.1 %
MEAN CORPUSCULAR HEMOGLOBIN CONC: 30.2 g/dL — ABNORMAL LOW (ref 31.0–37.0)
MEAN CORPUSCULAR HEMOGLOBIN: 28.1 pg (ref 26.0–34.0)
MEAN PLATELET VOLUME: 7.7 fL (ref 7.0–10.0)
MONOCYTES ABSOLUTE COUNT: 0.5 10*9/L (ref 0.2–0.8)
MONOCYTES RELATIVE PERCENT: 4.5 %
NEUTROPHILS ABSOLUTE COUNT: 8.8 10*9/L — ABNORMAL HIGH (ref 2.0–7.5)
NEUTROPHILS RELATIVE PERCENT: 73.6 %
PLATELET COUNT: 286 10*9/L (ref 150–440)
RED BLOOD CELL COUNT: 4.09 10*12/L — ABNORMAL LOW (ref 4.50–5.90)
RED CELL DISTRIBUTION WIDTH: 16.7 % — ABNORMAL HIGH (ref 12.0–15.0)
WBC ADJUSTED: 11.9 10*9/L — ABNORMAL HIGH (ref 4.5–11.0)

## 2018-10-24 LAB — TACROLIMUS, TROUGH: Lab: 7.7

## 2018-10-24 LAB — BURR CELLS

## 2018-10-24 LAB — BILIRUBIN DIRECT: Bilirubin.glucuronidated+Bilirubin.albumin bound:MCnc:Pt:Ser/Plas:Qn:: 0.1

## 2018-10-24 LAB — ESTIMATED AVERAGE GLUCOSE: Estimated average glucose:MCnc:Pt:Bld:Qn:Estimated from glycated hemoglobin: 88

## 2018-10-24 LAB — NEUTROPHILS ABSOLUTE COUNT: Neutrophils:NCnc:Pt:Bld:Qn:Automated count: 8.8 — ABNORMAL HIGH

## 2018-10-24 LAB — SLIDE REVIEW

## 2018-10-24 LAB — EGFR CKD-EPI NON-AA MALE: Lab: >90

## 2018-10-24 LAB — TACROLIMUS LEVEL: TACROLIMUS BLOOD: 9.9 ng/mL (ref 2.0–20.0)

## 2018-10-24 LAB — PHOSPHORUS: Phosphate:MCnc:Pt:Ser/Plas:Qn:: 3.5

## 2018-10-24 LAB — MAGNESIUM: Magnesium:MCnc:Pt:Ser/Plas:Qn:: 1.7

## 2018-10-24 LAB — GAMMA GLUTAMYL TRANSFERASE: Gamma glutamyl transferase:CCnc:Pt:Ser/Plas:Qn:: 62

## 2018-10-24 LAB — HEMOGLOBIN A1C: ESTIMATED AVERAGE GLUCOSE: 88 mg/dL

## 2018-10-24 NOTE — Unmapped (Signed)
TRANSPLANT SURGERY FOLLOW UP CLINIC NOTE     Assessment and Plan:   Anthony Alvarado is a 58 y.o. male who underwent OLT on 09/16/2018 for cryptogenic cirrhosis with [facility] who presents for follow up.    Immunosuppression:   - Tacrolimus continue 3.5mg  and 3mg  (goal trough 8-10 ng/mL) slightly below goal but continue current dosing for now  - Myfortic 360 mg BID    Volume overload  -improvmeent to RLL breath sounds, downtrending creat  - continued good protein intake and physical activity     DM  - at risk for hypoglycemia. Symptomatic   - Stop glipizide, monitor preprandial BG TID    Wound Care  - Remaining staples removed in clinic today   - approved to drive     Follow up: 3 month post transplant visit.   Labs: 2x weekly.    History of Present Illness:   Anthony Alvarado is a 58 y.o. male with cryptogenic cirrhosis and HCC s/p OLT 09/16/2018.  PMH additionally notable for severe aortic valve stenosis, HTN, DMII, OSA and Autoimmune cholangitis, carrier of hemochromatosis HFE gene mutation.  Post-operative course was relatively uncomplicated but he has been followed in clinic with persistent ascites and recent ED evaluation 8/19 with pulmonary effusion fever and PNA. He completed a course of levaquin with improvement of symptoms and resolution of symptoms.    Of note explant remonstrated viable HC within segment 6 but without vascular, lymph, or bile duct invasion. There was also a high grade dysplastic nodule with cirrhotic changes in segment 5. Prior review showed PAS-positive globules potentially consistently with A1AT but PI typing showed PiMM.     He denies acute complaint today. No N/V/D/C. Improvement to exercise tolerance and diet. Reporting some weakness after taking glipizide.    Current Outpatient Medications   Medication Sig Dispense Refill   ??? acetaminophen (TYLENOL) 325 MG tablet Take 2 tablets (650 mg total) by mouth every six (6) hours as needed for pain or fever (> 38C or 100.56F). 100 tablet 0   ??? aspirin (ECOTRIN) 81 MG tablet Take 1 tablet (81 mg total) by mouth daily. 30 tablet 11   ??? azelastine (ASTELIN) 137 mcg (0.1 %) nasal spray 1 spray by Each Nare route daily as needed.      ??? blood sugar diagnostic Strp Use as directed Three (3) times a day before meals. 90 each 11   ??? calcium carbonate (TUMS) 200 mg calcium (500 mg) chewable tablet Chew 1 tablet daily as needed for heartburn.     ??? [START ON 11/23/2018] cholecalciferol, vitamin D3, 1,000 unit (25 mcg) tablet Take 2 tablets (2,000 Units total) by mouth daily. 60 tablet 11   ??? ergocalciferol (DRISDOL) 1,250 mcg (50,000 unit) capsule Take 1 capsule (50,000 Units total) by mouth once a week for 4 doses. 4 capsule 0   ??? EUCRISA 2 % Oint Apply 1 application topically daily as needed.      ??? fluticasone propionate (FLONASE) 50 mcg/actuation nasal spray PLACE ONE OR TWO SPRAYS INTO BOTH NOSTRILS DAILY AS NEEDED.     ??? lancets 33 gauge Misc 1 each by Miscellaneous route Three (3) times a day before meals. 100 each 11   ??? levothyroxine (SYNTHROID) 150 MCG tablet Take 1 tablet (150 mcg total) by mouth daily. 30 tablet 11   ??? magnesium oxide-Mg AA chelate (MAGNESIUM, AMINO ACID CHELATE,) 133 mg Tab Take 1 tablet by mouth Two (2) times a day. HOLD until directed to start by your  coordinator. 60 tablet 11   ??? metroNIDAZOLE (METROGEL) 1 % gel Apply 1 application topically daily as needed.      ??? mycophenolate (MYFORTIC) 180 MG EC tablet Take 2 tablets (360 mg total) by mouth Two (2) times a day. 120 tablet 11   ??? oxyCODONE (ROXICODONE) 5 MG immediate release tablet Take 1-2 tablets (5-10 mg total) by mouth every six (6) hours as needed for pain. 40 tablet 0   ??? pantoprazole (PROTONIX) 40 MG tablet Take 1 tablet (40 mg total) by mouth daily as needed. 30 tablet 11   ??? sertraline (ZOLOFT) 25 MG tablet Take 1 tablet (25 mg total) by mouth daily. 30 tablet 11   ??? sulfamethoxazole-trimethoprim (BACTRIM) 400-80 mg per tablet Take 1 tablet (80 mg of trimethoprim total) by mouth 3 (three) times a week. 12 tablet 5   ??? tacrolimus (PROGRAF) 0.5 MG capsule Take 7 capsules (3.5mg ) by mouth in the morning and 6 capsules (3mg ) in the evening. 390 capsule 2   ??? valGANciclovir (VALCYTE) 450 mg tablet Take 1 tablet (450 mg total) by mouth daily. 30 tablet 2     No current facility-administered medications for this visit.      Facility-Administered Medications Ordered in Other Visits   Medication Dose Route Frequency Provider Last Rate Last Dose   ??? diazePAM (VALIUM) 5 mg/mL injection                Physical Exam:  BP 156/88  - Pulse 68  - Temp 36.7 ??C (98 ??F) (Tympanic)  - Ht 172.7 cm (5' 8)  - Wt 80.3 kg (177 lb 1.6 oz)  - BMI 26.93 kg/m??   General Appearance:  No acute distress, well appearing and well nourished.   Head:  Normocephalic, atraumatic.   Eyes:  No scleral icterus.   Pulmonary:    Normal respiratory effort. Decreased breath sounds at right base.   Cardiovascular:  Regular rate and rhythm.   Abdomen:   Incision CDI, remainder of staples removed today   Neurologic: Non-focal exam.         Lab Results   Component Value Date    WBC 11.9 (H) 10/24/2018    HGB 11.5 (L) 10/24/2018    HCT 38.1 (L) 10/24/2018    PLT 286 10/24/2018     Lab Results   Component Value Date    NA 141 10/24/2018    K 4.7 10/24/2018    CL 109 (H) 10/24/2018    CO2 21.0 (L) 10/24/2018    BUN 24 (H) 10/24/2018    CREATININE 0.83 10/24/2018    CALCIUM 9.4 10/24/2018    MG 1.7 10/24/2018    PHOS 3.5 10/24/2018      Lab Results   Component Value Date    ALKPHOS 89 10/24/2018    BILITOT 0.6 10/24/2018    BILIDIR 0.10 10/24/2018    PROT 6.7 10/24/2018    ALBUMIN 4.3 10/24/2018    ALT 14 10/24/2018    AST 21 10/24/2018    GGT 62 10/24/2018      Lab Results   Component Value Date    APTT 29.6 10/12/2018        IMAGING:  Xr Chest 1 View Portable    Result Date: 10/12/2018  EXAM: XR CHEST PORTABLE DATE: 10/12/2018 11:02 AM ACCESSION: [id number] UN DICTATED: 10/12/2018 11:04 AM INTERPRETATION LOCATION: [facility] CLINICAL INDICATION: 58 years old Male with SHORTNESS OF BREATH  COMPARISON: Chest radiograph 10/10/2018 TECHNIQUE: Portable Chest Radiograph. FINDINGS: Right  medial lung base patchy airspace opacity. Elevated right hemidiaphragm. Small right pleural effusion, grossly similar to prior. Stable cardiomediastinal silhouette. Surgical drain in the right upper quadrant.     Right medial lung base patchy airspace opacity, may represent infection the appropriate clinical setting versus atelectasis. Stable small right pleural effusion.          Gertie Fey, DNP, APRN, FNP-C  [facility]  [address]  [place], [place]  [address]

## 2018-10-25 ENCOUNTER — Other Ambulatory Visit: Payer: Self-pay

## 2018-10-25 ENCOUNTER — Other Ambulatory Visit (INDEPENDENT_AMBULATORY_CARE_PROVIDER_SITE_OTHER): Payer: BC Managed Care – PPO

## 2018-10-25 DIAGNOSIS — Z79899 Other long term (current) drug therapy: Secondary | ICD-10-CM

## 2018-10-25 DIAGNOSIS — E612 Magnesium deficiency: Secondary | ICD-10-CM

## 2018-10-25 DIAGNOSIS — Z944 Liver transplant status: Secondary | ICD-10-CM

## 2018-10-25 DIAGNOSIS — E119 Type 2 diabetes mellitus without complications: Secondary | ICD-10-CM

## 2018-10-25 DIAGNOSIS — E039 Hypothyroidism, unspecified: Secondary | ICD-10-CM

## 2018-10-25 LAB — HEMOGLOBIN A1C: Hgb A1c MFr Bld: 4.7 % (ref 4.6–6.5)

## 2018-10-25 LAB — TSH: TSH: 6.13 u[IU]/mL — ABNORMAL HIGH (ref 0.35–4.50)

## 2018-10-27 DIAGNOSIS — E612 Magnesium deficiency: Secondary | ICD-10-CM

## 2018-10-27 DIAGNOSIS — Z944 Liver transplant status: Secondary | ICD-10-CM

## 2018-10-27 DIAGNOSIS — Z79899 Other long term (current) drug therapy: Secondary | ICD-10-CM

## 2018-10-28 ENCOUNTER — Ambulatory Visit: Payer: BC Managed Care – PPO | Admitting: Family Medicine

## 2018-10-28 ENCOUNTER — Other Ambulatory Visit: Payer: Self-pay

## 2018-10-28 ENCOUNTER — Encounter: Payer: Self-pay | Admitting: Family Medicine

## 2018-10-28 VITALS — BP 142/84 | HR 71 | Temp 98.4°F | Ht 68.0 in | Wt 178.2 lb

## 2018-10-28 DIAGNOSIS — Z23 Encounter for immunization: Secondary | ICD-10-CM

## 2018-10-28 DIAGNOSIS — Z944 Liver transplant status: Secondary | ICD-10-CM

## 2018-10-28 DIAGNOSIS — E039 Hypothyroidism, unspecified: Secondary | ICD-10-CM | POA: Diagnosis not present

## 2018-10-28 LAB — PHOSPHORUS, SERUM: Phosphate:MCnc:Pt:Ser/Plas:Qn:: 4

## 2018-10-28 LAB — COMPREHENSIVE METABOLIC PANEL
A/G RATIO: 2 (ref 1.2–2.2)
ALBUMIN: 4.1 g/dL (ref 3.8–4.9)
ALKALINE PHOSPHATASE: 106 IU/L (ref 39–117)
ALT (SGPT): 16 IU/L (ref 0–44)
AST (SGOT): 18 IU/L (ref 0–40)
BILIRUBIN TOTAL: 0.5 mg/dL (ref 0.0–1.2)
BLOOD UREA NITROGEN: 26 mg/dL — ABNORMAL HIGH (ref 6–24)
BUN / CREAT RATIO: 27 — ABNORMAL HIGH (ref 9–20)
CALCIUM: 9.3 mg/dL (ref 8.7–10.2)
CHLORIDE: 105 mmol/L (ref 96–106)
CO2: 20 mmol/L (ref 20–29)
CREATININE: 0.96 mg/dL (ref 0.76–1.27)
GLOBULIN, TOTAL: 2.1 g/dL (ref 1.5–4.5)
GLUCOSE: 93 mg/dL (ref 65–99)
POTASSIUM: 4.9 mmol/L (ref 3.5–5.2)

## 2018-10-28 LAB — CBC W/ DIFFERENTIAL
BASOPHILS ABSOLUTE COUNT: 0.2 10*3/uL (ref 0.0–0.2)
BASOPHILS RELATIVE PERCENT: 2 %
EOSINOPHILS ABSOLUTE COUNT: 0.4 10*3/uL (ref 0.0–0.4)
EOSINOPHILS RELATIVE PERCENT: 4 %
HEMATOCRIT: 35.2 % — ABNORMAL LOW (ref 37.5–51.0)
HEMOGLOBIN: 11.4 g/dL — ABNORMAL LOW (ref 13.0–17.7)
IMMATURE GRANULOCYTES: 1 %
LYMPHOCYTES ABSOLUTE COUNT: 1.7 10*3/uL (ref 0.7–3.1)
MEAN CORPUSCULAR HEMOGLOBIN: 28.6 pg (ref 26.6–33.0)
MEAN CORPUSCULAR VOLUME: 88 fL (ref 79–97)
MONOCYTES ABSOLUTE COUNT: 0.7 10*3/uL (ref 0.1–0.9)
MONOCYTES RELATIVE PERCENT: 7 %
NEUTROPHILS ABSOLUTE COUNT: 7.2 10*3/uL — ABNORMAL HIGH (ref 1.4–7.0)
PLATELET COUNT: 252 10*3/uL (ref 150–450)
RED BLOOD CELL COUNT: 3.98 x10E6/uL — ABNORMAL LOW (ref 4.14–5.80)
RED CELL DISTRIBUTION WIDTH: 14.6 % (ref 11.6–15.4)
WHITE BLOOD CELL COUNT: 10.4 10*3/uL (ref 3.4–10.8)

## 2018-10-28 LAB — BILIRUBIN DIRECT: Bilirubin.glucuronidated+Bilirubin.albumin bound:MCnc:Pt:Ser/Plas:Qn:: 0.26

## 2018-10-28 LAB — CO2: Carbon dioxide:SCnc:Pt:Ser/Plas:Qn:: 20

## 2018-10-28 LAB — EOSINOPHILS RELATIVE PERCENT: Eosinophils/100 leukocytes:NFr:Pt:Bld:Qn:Automated count: 4

## 2018-10-28 LAB — GAMMA GLUTAMYL TRANSFERASE: Gamma glutamyl transferase:CCnc:Pt:Ser/Plas:Qn:: 48

## 2018-10-28 LAB — BILIRUBIN, DIRECT: BILIRUBIN DIRECT: 0.26 mg/dL (ref 0.00–0.40)

## 2018-10-28 LAB — MAGNESIUM: Magnesium:MCnc:Pt:Ser/Plas:Qn:: 1.7
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
## 2018-10-28 NOTE — Progress Notes (Signed)
Hypothyroidism.  No neck mass, no dysphagia.  Compliant.  TSH better but not totally normal, d/w pt.   Liver transplant.  Status post inpatient treatment at [facility].  He had a successful transplant but was complicated by pneumothorax.  Fortunately that resolved.  He also had an interval fever requiring evaluation at [facility] but was also treated in the meantime and he has been afebrile in the interval.  His mentation is good.  He is compliant with medications.  He is not having abdominal pain.  He has some mild soreness at his incision site but no abdominal pain otherwise.  No jaundice.  No vomiting.  No diarrhea.  He is noted some decrease in exercise tolerance especially in the heat.  He can still walk 1/8 of a mile at a time.  His sugar has been anywhere from 90-150.  He is not on any diabetes or blood pressure medications now.  He has routine follow-up scheduled at [facility].  PMH and SH reviewed  ROS: Per HPI unless specifically indicated in ROS section   Meds, vitals, and allergies reviewed.   GEN: nad, alert and oriented HEENT: nat NECK: supple w/o LA, no thyromegaly. CV: rrr.  Systolic ejection murmur noted at the right upper sternal border, old finding. PULM: ctab, no inc wob ABD: soft, +bs, healing incision site noted.  He has 1 exposed suture on the left upper abdomen and this was snipped easily without complication.  No bleeding. EXT: no edema SKIN: no acute rash

## 2018-10-28 NOTE — Patient Instructions (Signed)
Flu shot today.  Recheck in about 6 months (3 months if needed) We can do labs at the visit.  Take care.  Glad to see you.

## 2018-10-30 LAB — TACROLIMUS BLOOD: Tacrolimus:MCnc:Pt:Bld:Qn:LC/MS/MS: 6.6

## 2018-10-31 DIAGNOSIS — E612 Magnesium deficiency: Secondary | ICD-10-CM

## 2018-10-31 DIAGNOSIS — Z944 Liver transplant status: Secondary | ICD-10-CM

## 2018-10-31 DIAGNOSIS — Z79899 Other long term (current) drug therapy: Secondary | ICD-10-CM

## 2018-10-31 NOTE — Assessment & Plan Note (Signed)
TSH better but not totally normal, d/w pt. I think is probably the best not to change his medication at this point.  We can recheck episodically.  He agrees.  See after visit summary.  Flu shot today.

## 2018-10-31 NOTE — Assessment & Plan Note (Signed)
See above.  He is clearly doing better in the meantime from a liver standpoint.  His pneumothorax has apparently resolved.  He is not having fevers.  He is compliant with all of his medications.  I would expect his exercise tolerance to gradually improve but he has been through a lot this summer and I would expect some level of deconditioning.  No jaundice.  Still okay for outpatient follow-up.  He will follow-up with [facility].  >25 minutes spent in face to face time with patient, >50% spent in counselling or coordination of care.

## 2018-11-01 ENCOUNTER — Other Ambulatory Visit: Payer: Self-pay | Admitting: Family Medicine

## 2018-11-01 DIAGNOSIS — E612 Magnesium deficiency: Secondary | ICD-10-CM

## 2018-11-01 DIAGNOSIS — Z944 Liver transplant status: Secondary | ICD-10-CM

## 2018-11-01 DIAGNOSIS — Z79899 Other long term (current) drug therapy: Secondary | ICD-10-CM

## 2018-11-02 LAB — CBC W/ DIFFERENTIAL
BANDED NEUTROPHILS ABSOLUTE COUNT: 0.1 10*3/uL (ref 0.0–0.1)
BASOPHILS ABSOLUTE COUNT: 0.1 10*3/uL (ref 0.0–0.2)
BASOPHILS RELATIVE PERCENT: 1 %
EOSINOPHILS RELATIVE PERCENT: 4 %
HEMATOCRIT: 34.9 % — ABNORMAL LOW (ref 37.5–51.0)
HEMOGLOBIN: 11.3 g/dL — ABNORMAL LOW (ref 13.0–17.7)
IMMATURE GRANULOCYTES: 1 %
LYMPHOCYTES ABSOLUTE COUNT: 1.1 10*3/uL (ref 0.7–3.1)
LYMPHOCYTES RELATIVE PERCENT: 11 %
MEAN CORPUSCULAR HEMOGLOBIN: 28.3 pg (ref 26.6–33.0)
MEAN CORPUSCULAR VOLUME: 88 fL (ref 79–97)
MONOCYTES ABSOLUTE COUNT: 0.7 10*3/uL (ref 0.1–0.9)
MONOCYTES RELATIVE PERCENT: 7 %
NEUTROPHILS ABSOLUTE COUNT: 7.9 10*3/uL — ABNORMAL HIGH (ref 1.4–7.0)
NEUTROPHILS RELATIVE PERCENT: 76 %
PLATELET COUNT: 217 10*3/uL (ref 150–450)
RED BLOOD CELL COUNT: 3.99 x10E6/uL — ABNORMAL LOW (ref 4.14–5.80)
RED CELL DISTRIBUTION WIDTH: 14.3 % (ref 11.6–15.4)
WHITE BLOOD CELL COUNT: 10.3 10*3/uL (ref 3.4–10.8)

## 2018-11-02 LAB — COMPREHENSIVE METABOLIC PANEL
A/G RATIO: 2.3 — ABNORMAL HIGH (ref 1.2–2.2)
ALBUMIN: 4.4 g/dL (ref 3.8–4.9)
ALKALINE PHOSPHATASE: 108 IU/L (ref 39–117)
ALT (SGPT): 17 IU/L (ref 0–44)
AST (SGOT): 14 IU/L (ref 0–40)
BILIRUBIN TOTAL: 0.5 mg/dL (ref 0.0–1.2)
BLOOD UREA NITROGEN: 22 mg/dL (ref 6–24)
BUN / CREAT RATIO: 20 (ref 9–20)
CALCIUM: 9.2 mg/dL (ref 8.7–10.2)
CO2: 21 mmol/L (ref 20–29)
CREATININE: 1.11 mg/dL (ref 0.76–1.27)
GLUCOSE: 91 mg/dL (ref 65–99)
POTASSIUM: 5 mmol/L (ref 3.5–5.2)
SODIUM: 142 mmol/L (ref 134–144)
TOTAL PROTEIN: 6.3 g/dL (ref 6.0–8.5)

## 2018-11-02 LAB — PHOSPHORUS, SERUM: Phosphate:MCnc:Pt:Ser/Plas:Qn:: 3.8

## 2018-11-02 LAB — GAMMA GLUTAMYL TRANSFERASE: Gamma glutamyl transferase:CCnc:Pt:Ser/Plas:Qn:: 49

## 2018-11-02 LAB — TACROLIMUS BLOOD: Tacrolimus:MCnc:Pt:Bld:Qn:LC/MS/MS: 8.8

## 2018-11-02 LAB — MAGNESIUM: Magnesium:MCnc:Pt:Ser/Plas:Qn:: 1.7

## 2018-11-02 LAB — TOTAL PROTEIN: Protein:MCnc:Pt:Ser/Plas:Qn:: 6.3

## 2018-11-02 LAB — PHOSPHORUS: PHOSPHORUS, SERUM: 3.8 mg/dL (ref 2.8–4.1)

## 2018-11-02 LAB — BILIRUBIN DIRECT: Bilirubin.glucuronidated+Bilirubin.albumin bound:MCnc:Pt:Ser/Plas:Qn:: 0.22

## 2018-11-02 LAB — RED CELL DISTRIBUTION WIDTH: Erythrocyte distribution width:Ratio:Pt:RBC:Qn:Automated count: 14.3

## 2018-11-02 NOTE — Unmapped (Signed)
Patient's 8/31 and 9/3 tac levels subpar, but yesterday's resulted in range. Txp pharmD decided no change was needed.

## 2018-11-03 DIAGNOSIS — Z79899 Other long term (current) drug therapy: Secondary | ICD-10-CM

## 2018-11-03 DIAGNOSIS — Z944 Liver transplant status: Secondary | ICD-10-CM

## 2018-11-03 DIAGNOSIS — E612 Magnesium deficiency: Secondary | ICD-10-CM

## 2018-11-03 DIAGNOSIS — C220 Liver cell carcinoma: Secondary | ICD-10-CM | POA: Diagnosis not present

## 2018-11-04 DIAGNOSIS — Z944 Liver transplant status: Secondary | ICD-10-CM

## 2018-11-04 DIAGNOSIS — I10 Essential (primary) hypertension: Secondary | ICD-10-CM

## 2018-11-04 DIAGNOSIS — R1011 Right upper quadrant pain: Secondary | ICD-10-CM

## 2018-11-04 DIAGNOSIS — R300 Dysuria: Secondary | ICD-10-CM

## 2018-11-04 LAB — CBC W/ DIFFERENTIAL
BASOPHILS ABSOLUTE COUNT: 0.1 10*3/uL (ref 0.0–0.2)
EOSINOPHILS ABSOLUTE COUNT: 0.7 10*3/uL — ABNORMAL HIGH (ref 0.0–0.4)
EOSINOPHILS RELATIVE PERCENT: 8 %
HEMATOCRIT: 37.9 % (ref 37.5–51.0)
HEMOGLOBIN: 11.9 g/dL — ABNORMAL LOW (ref 13.0–17.7)
IMMATURE GRANULOCYTES: 0 %
LYMPHOCYTES ABSOLUTE COUNT: 1.7 10*3/uL (ref 0.7–3.1)
MEAN CORPUSCULAR HEMOGLOBIN CONC: 31.4 g/dL — ABNORMAL LOW (ref 31.5–35.7)
MEAN CORPUSCULAR HEMOGLOBIN: 28.2 pg (ref 26.6–33.0)
MEAN CORPUSCULAR VOLUME: 90 fL (ref 79–97)
MONOCYTES ABSOLUTE COUNT: 0.6 10*3/uL (ref 0.1–0.9)
MONOCYTES RELATIVE PERCENT: 7 %
NEUTROPHILS ABSOLUTE COUNT: 6.3 10*3/uL (ref 1.4–7.0)
NEUTROPHILS RELATIVE PERCENT: 66 %
PLATELET COUNT: 209 10*3/uL (ref 150–450)
RED BLOOD CELL COUNT: 4.22 x10E6/uL (ref 4.14–5.80)
RED CELL DISTRIBUTION WIDTH: 14.4 % (ref 11.6–15.4)
WHITE BLOOD CELL COUNT: 9.6 10*3/uL (ref 3.4–10.8)

## 2018-11-04 LAB — BILIRUBIN DIRECT: Bilirubin.glucuronidated+Bilirubin.albumin bound:MCnc:Pt:Ser/Plas:Qn:: 0.2

## 2018-11-04 LAB — COMPREHENSIVE METABOLIC PANEL
A/G RATIO: 2.4 — ABNORMAL HIGH (ref 1.2–2.2)
ALBUMIN: 4.6 g/dL (ref 3.8–4.9)
ALKALINE PHOSPHATASE: 105 IU/L (ref 39–117)
ALT (SGPT): 14 IU/L (ref 0–44)
AST (SGOT): 16 IU/L (ref 0–40)
BLOOD UREA NITROGEN: 24 mg/dL (ref 6–24)
BUN / CREAT RATIO: 23 — ABNORMAL HIGH (ref 9–20)
CALCIUM: 9.6 mg/dL (ref 8.7–10.2)
CHLORIDE: 107 mmol/L — ABNORMAL HIGH (ref 96–106)
CO2: 21 mmol/L (ref 20–29)
CREATININE: 1.03 mg/dL (ref 0.76–1.27)
GLOBULIN, TOTAL: 1.9 g/dL (ref 1.5–4.5)
GLUCOSE: 99 mg/dL (ref 65–99)
POTASSIUM: 4.8 mmol/L (ref 3.5–5.2)
TOTAL PROTEIN: 6.5 g/dL (ref 6.0–8.5)

## 2018-11-04 LAB — MAGNESIUM: Magnesium:MCnc:Pt:Ser/Plas:Qn:: 1.6

## 2018-11-04 LAB — GAMMA GLUTAMYL TRANSFERASE: Gamma glutamyl transferase:CCnc:Pt:Ser/Plas:Qn:: 46

## 2018-11-04 LAB — SPECIMEN STATUS REPORT

## 2018-11-04 LAB — HEMOGLOBIN: Hemoglobin:MCnc:Pt:Bld:Qn:: 11.9 — ABNORMAL LOW

## 2018-11-04 LAB — PHOSPHORUS, SERUM: Phosphate:MCnc:Pt:Ser/Plas:Qn:: 3.7

## 2018-11-04 LAB — CO2: Carbon dioxide:SCnc:Pt:Ser/Plas:Qn:: 21

## 2018-11-04 MED ORDER — AMLODIPINE 5 MG TABLET
ORAL_TABLET | Freq: Every day | ORAL | 11 refills | 30 days | Status: CP
Start: 2018-11-04 — End: 2018-11-21

## 2018-11-04 NOTE — Unmapped (Signed)
Called pt and scheduled liver US for Thurs 9/17 at 8:45 am at [facility]. Reminded him of npo, and he verbalized understanding of all discussed.

## 2018-11-04 NOTE — Unmapped (Signed)
error 

## 2018-11-04 NOTE — Unmapped (Addendum)
Patient reached out to coordinator to report worsening back pain. He states he has had slight back pain for the last couple weeks, but it has worsened in the last few days requiring his taking some leftover oxycodone. He reports SOB is the same as it has been.Pain is described as throbbing aching, on right back/flank close to rib cage, but slightly on left side as well. He denies fever/chills,dysuria change in UO, color or character. Pain not relieved or improved by positional changes and he reports pressing on the area does not affect the pain. Reviewed sx with NP Martin-Velez who recommended he have an [place] and txp PharmD recommended starting amlodipine 5mg  daily for his bp.     Relayed [place] and amlodipine recommendations to patient and that [facility] would call to schedule imaging. He already had amlodipine on hand. He then added that he feels he might have some burning sensation with urination, but was not sure. Sent UA/UCx orders to [facility] and instructed patient to pickup sterile container from them as well. He verbalized understanding of plan.

## 2018-11-05 LAB — TACROLIMUS BLOOD: Tacrolimus:MCnc:Pt:Bld:Qn:LC/MS/MS: 11.4

## 2018-11-05 LAB — AFP-TUMOR MARKER: Alpha-1-Fetoprotein.tumor marker:MCnc:Pt:Ser/Plas:Qn:: <0.9

## 2018-11-05 LAB — AFP TUMOR MARKER: AFP-TUMOR MARKER: <0.9 ng/mL (ref 0.0–8.3)

## 2018-11-07 DIAGNOSIS — R300 Dysuria: Secondary | ICD-10-CM

## 2018-11-07 DIAGNOSIS — Z79899 Other long term (current) drug therapy: Secondary | ICD-10-CM

## 2018-11-07 DIAGNOSIS — E612 Magnesium deficiency: Secondary | ICD-10-CM

## 2018-11-07 DIAGNOSIS — Z944 Liver transplant status: Secondary | ICD-10-CM

## 2018-11-07 LAB — CBC W/ DIFFERENTIAL
BANDED NEUTROPHILS ABSOLUTE COUNT: 0.1 10*3/uL (ref 0.0–0.1)
BASOPHILS ABSOLUTE COUNT: 0.1 10*3/uL (ref 0.0–0.2)
EOSINOPHILS ABSOLUTE COUNT: 0.7 10*3/uL — ABNORMAL HIGH (ref 0.0–0.4)
EOSINOPHILS RELATIVE PERCENT: 7 %
HEMATOCRIT: 37.4 % — ABNORMAL LOW (ref 37.5–51.0)
IMMATURE GRANULOCYTES: 1 %
LYMPHOCYTES ABSOLUTE COUNT: 1.8 10*3/uL (ref 0.7–3.1)
LYMPHOCYTES RELATIVE PERCENT: 20 %
MEAN CORPUSCULAR HEMOGLOBIN CONC: 32.6 g/dL (ref 31.5–35.7)
MEAN CORPUSCULAR HEMOGLOBIN: 28.3 pg (ref 26.6–33.0)
MEAN CORPUSCULAR VOLUME: 87 fL (ref 79–97)
MONOCYTES ABSOLUTE COUNT: 0.7 10*3/uL (ref 0.1–0.9)
MONOCYTES RELATIVE PERCENT: 7 %
NEUTROPHILS ABSOLUTE COUNT: 5.9 10*3/uL (ref 1.4–7.0)
NEUTROPHILS RELATIVE PERCENT: 63 %
PLATELET COUNT: 211 10*3/uL (ref 150–450)
RED BLOOD CELL COUNT: 4.31 x10E6/uL (ref 4.14–5.80)
RED CELL DISTRIBUTION WIDTH: 14 % (ref 11.6–15.4)
WHITE BLOOD CELL COUNT: 9.2 10*3/uL (ref 3.4–10.8)

## 2018-11-07 LAB — MEAN CORPUSCULAR HEMOGLOBIN CONC: Erythrocyte mean corpuscular hemoglobin concentration:MCnc:Pt:RBC:Qn:Automated count: 32.6

## 2018-11-07 MED ORDER — TACROLIMUS 0.5 MG CAPSULE
ORAL_CAPSULE | Freq: Two times a day (BID) | ORAL | 2 refills | 30 days | Status: CP
Start: 2018-11-07 — End: 2018-11-10

## 2018-11-07 NOTE — Unmapped (Signed)
Change in Tacrolimus dosage decrease. Refill too soon until 11/12/2018. Will attempt test claim on 11/14/2018 date.

## 2018-11-07 NOTE — Unmapped (Signed)
Patient's 9/10 tac level supratherapeutic. Reviewed with NP Martin-Velez, who recommended reducing his tac dose to 3mg  bid. Relayed dose change to patient, he verbalized understanding. He also said he left a urine specimen at [facility] today.

## 2018-11-08 DIAGNOSIS — Z944 Liver transplant status: Secondary | ICD-10-CM

## 2018-11-08 DIAGNOSIS — E612 Magnesium deficiency: Secondary | ICD-10-CM

## 2018-11-08 DIAGNOSIS — Z79899 Other long term (current) drug therapy: Secondary | ICD-10-CM

## 2018-11-08 LAB — COMPREHENSIVE METABOLIC PANEL
ALBUMIN: 4.3 g/dL (ref 3.8–4.9)
ALKALINE PHOSPHATASE: 106 IU/L (ref 39–117)
ALT (SGPT): 17 IU/L (ref 0–44)
AST (SGOT): 16 IU/L (ref 0–40)
BUN / CREAT RATIO: 22 — ABNORMAL HIGH (ref 9–20)
CALCIUM: 9.4 mg/dL (ref 8.7–10.2)
CHLORIDE: 105 mmol/L (ref 96–106)
CO2: 22 mmol/L (ref 20–29)
CREATININE: 1 mg/dL (ref 0.76–1.27)
GLOBULIN, TOTAL: 2.3 g/dL (ref 1.5–4.5)
GLUCOSE: 109 mg/dL — ABNORMAL HIGH (ref 65–99)
POTASSIUM: 4.6 mmol/L (ref 3.5–5.2)
SODIUM: 142 mmol/L (ref 134–144)
TOTAL PROTEIN: 6.6 g/dL (ref 6.0–8.5)

## 2018-11-08 LAB — GAMMA GLUTAMYL TRANSFERASE: Gamma glutamyl transferase:CCnc:Pt:Ser/Plas:Qn:: 43

## 2018-11-08 LAB — BILIRUBIN DIRECT: Bilirubin.glucuronidated+Bilirubin.albumin bound:MCnc:Pt:Ser/Plas:Qn:: 0.19

## 2018-11-08 LAB — PHOSPHORUS, SERUM: Phosphate:MCnc:Pt:Ser/Plas:Qn:: 3.4

## 2018-11-08 LAB — ALKALINE PHOSPHATASE: Alkaline phosphatase:CCnc:Pt:Ser/Plas:Qn:: 106

## 2018-11-08 LAB — MAGNESIUM: Magnesium:MCnc:Pt:Ser/Plas:Qn:: 1.4 — ABNORMAL LOW

## 2018-11-09 DIAGNOSIS — Z944 Liver transplant status: Secondary | ICD-10-CM

## 2018-11-09 DIAGNOSIS — E559 Vitamin D deficiency, unspecified: Secondary | ICD-10-CM

## 2018-11-09 LAB — TACROLIMUS BLOOD: Tacrolimus:MCnc:Pt:Bld:Qn:LC/MS/MS: 11.2

## 2018-11-09 NOTE — Unmapped (Signed)
[name] Refill Coordination Note    Specialty Medication(s) to be Shipped:   Transplant: mycophenolate mofetil 180mg , tacrolimus 0.5mg  and valgancyclovir 450mg     Other medication(s) to be shipped: Vitamin D and Bactrim     Anthony Alvarado, DOB: 01/20/1961  Phone: [phone] (home)       All above HIPAA information was verified with patient.     Completed refill call assessment today to schedule patient's medication shipment from the [facility] [phone]).       Specialty medication(s) and dose(s) confirmed: Patient reports changes to the regimen as follows: Tacrolimus 0.5mg  - Take 6 capsules (3 mg total) by mouth two (2) times a day.   Changes to medications: [name] reports no changes at this time.  Changes to insurance: No  Questions for the pharmacist: No    Confirmed patient received Welcome Packet with first shipment. The patient will receive a drug information handout for each medication shipped and additional FDA Medication Guides as required.       DISEASE/MEDICATION-SPECIFIC INFORMATION        N/A    SPECIALTY MEDICATION ADHERENCE     Medication Adherence    Patient reported X missed doses in the last month: 1  Specialty Medication: mycophenolate 180mg   Patient is on additional specialty medications: Yes  Additional Specialty Medications: Tacrolimus 0.5mg   Patient Reported Additional Medication X Missed Doses in the Last Month: 1  Patient is on more than two specialty medications: Yes  Specialty Medication: valGANciclovir 450 mg tablet (VALCYTE)  Patient Reported Additional Medication X Missed Doses in the Last Month: 1          Mycophenolate 180 mg: 10 days of medicine on hand   Tacrolimus 0.5 mg: 10 days of medicine on hand   valGANciclovir 450 mg tablet (VALCYTE): 10 days of medicine on hand     SHIPPING     Shipping address confirmed in [employer].     Delivery Scheduled: Yes, Expected medication delivery date: 11/15/2018.     Medication will be delivered via UPS to the home address in [employer] [place].    [name]   [facility] Specialty Technician

## 2018-11-10 ENCOUNTER — Encounter: Admit: 2018-11-10 | Discharge: 2018-11-11 | Payer: MEDICARE

## 2018-11-10 DIAGNOSIS — E875 Hyperkalemia: Secondary | ICD-10-CM

## 2018-11-10 DIAGNOSIS — Z79899 Other long term (current) drug therapy: Secondary | ICD-10-CM

## 2018-11-10 DIAGNOSIS — K7689 Other specified diseases of liver: Secondary | ICD-10-CM

## 2018-11-10 DIAGNOSIS — Z944 Liver transplant status: Secondary | ICD-10-CM

## 2018-11-10 DIAGNOSIS — R188 Other ascites: Secondary | ICD-10-CM

## 2018-11-10 DIAGNOSIS — M549 Dorsalgia, unspecified: Secondary | ICD-10-CM

## 2018-11-10 DIAGNOSIS — R1011 Right upper quadrant pain: Secondary | ICD-10-CM

## 2018-11-10 DIAGNOSIS — E612 Magnesium deficiency: Secondary | ICD-10-CM

## 2018-11-10 DIAGNOSIS — Z940 Kidney transplant status: Secondary | ICD-10-CM | POA: Diagnosis not present

## 2018-11-10 DIAGNOSIS — K661 Hemoperitoneum: Secondary | ICD-10-CM | POA: Diagnosis not present

## 2018-11-10 LAB — COMPREHENSIVE METABOLIC PANEL
ALBUMIN: 4.7 g/dL (ref 3.5–5.0)
ALKALINE PHOSPHATASE: 92 U/L (ref 38–126)
ALT (SGPT): 23 U/L (ref <50)
ANION GAP: 10 mmol/L (ref 7–15)
AST (SGOT): 26 U/L (ref 19–55)
BILIRUBIN TOTAL: 0.5 mg/dL (ref 0.0–1.2)
BLOOD UREA NITROGEN: 28 mg/dL — ABNORMAL HIGH (ref 7–21)
BUN / CREAT RATIO: 26
CALCIUM: 9.9 mg/dL (ref 8.5–10.2)
CHLORIDE: 103 mmol/L (ref 98–107)
CO2: 24 mmol/L (ref 22.0–30.0)
CREATININE: 1.08 mg/dL (ref 0.70–1.30)
EGFR CKD-EPI AA MALE: 88 mL/min/{1.73_m2} (ref >=60)
EGFR CKD-EPI NON-AA MALE: 76 mL/min/{1.73_m2} (ref >=60)
GLUCOSE RANDOM: 107 mg/dL — ABNORMAL HIGH (ref 70–99)
POTASSIUM: 5.7 mmol/L — ABNORMAL HIGH (ref 3.5–5.0)
PROTEIN TOTAL: 7 g/dL (ref 6.5–8.3)
SODIUM: 137 mmol/L (ref 135–145)

## 2018-11-10 LAB — CBC W/ AUTO DIFF
BASOPHILS ABSOLUTE COUNT: 0.1 10*9/L (ref 0.0–0.1)
BASOPHILS RELATIVE PERCENT: 1.4 %
EOSINOPHILS ABSOLUTE COUNT: 0.6 10*9/L — ABNORMAL HIGH (ref 0.0–0.4)
EOSINOPHILS RELATIVE PERCENT: 6.4 %
HEMATOCRIT: 41.4 % (ref 41.0–53.0)
HEMOGLOBIN: 12.9 g/dL — ABNORMAL LOW (ref 13.5–17.5)
LARGE UNSTAINED CELLS: 2 % (ref 0–4)
LYMPHOCYTES ABSOLUTE COUNT: 2.1 10*9/L (ref 1.5–5.0)
LYMPHOCYTES RELATIVE PERCENT: 21.3 %
MEAN CORPUSCULAR HEMOGLOBIN CONC: 31.1 g/dL (ref 31.0–37.0)
MEAN CORPUSCULAR VOLUME: 89.3 fL (ref 80.0–100.0)
MEAN PLATELET VOLUME: 8.1 fL (ref 7.0–10.0)
MONOCYTES ABSOLUTE COUNT: 0.5 10*9/L (ref 0.2–0.8)
MONOCYTES RELATIVE PERCENT: 4.8 %
NEUTROPHILS RELATIVE PERCENT: 64.6 %
PLATELET COUNT: 256 10*9/L (ref 150–440)
RED BLOOD CELL COUNT: 4.63 10*12/L (ref 4.50–5.90)
RED CELL DISTRIBUTION WIDTH: 15.9 % — ABNORMAL HIGH (ref 12.0–15.0)
WBC ADJUSTED: 9.7 10*9/L (ref 4.5–11.0)

## 2018-11-10 LAB — MAGNESIUM
MAGNESIUM: 1.6 mg/dL (ref 1.6–2.2)
Magnesium:MCnc:Pt:Ser/Plas:Qn:: 1.6

## 2018-11-10 LAB — PHOSPHORUS: Phosphate:MCnc:Pt:Ser/Plas:Qn:: 3.7

## 2018-11-10 LAB — GAMMA GLUTAMYL TRANSFERASE: Gamma glutamyl transferase:CCnc:Pt:Ser/Plas:Qn:: 48

## 2018-11-10 LAB — BILIRUBIN DIRECT: Bilirubin.glucuronidated+Bilirubin.albumin bound:MCnc:Pt:Ser/Plas:Qn:: 0.2

## 2018-11-10 LAB — TACROLIMUS BLOOD: Lab: 10

## 2018-11-10 LAB — SMEAR REVIEW

## 2018-11-10 LAB — EOSINOPHILS ABSOLUTE COUNT: Lab: 0.6 — ABNORMAL HIGH

## 2018-11-10 LAB — BUN / CREAT RATIO: Urea nitrogen/Creatinine:MRto:Pt:Ser/Plas:Qn:: 26

## 2018-11-10 MED ORDER — CYCLOBENZAPRINE 5 MG TABLET
ORAL_TABLET | Freq: Three times a day (TID) | ORAL | 0 refills | 30.00000 days | Status: SS | PRN
Start: 2018-11-10 — End: 2018-11-30

## 2018-11-10 MED ORDER — TACROLIMUS 0.5 MG CAPSULE
ORAL_CAPSULE | Freq: Two times a day (BID) | ORAL | 11 refills | 30 days | Status: CP
Start: 2018-11-10 — End: 2018-11-21
  Filled 2018-11-14: qty 300, 30d supply, fill #0

## 2018-11-10 MED ORDER — SODIUM POLYSTYRENE SULFONATE 15 GRAM/60 ML ORAL SUSPENSION
Freq: Once | ORAL | 0 refills | 1.00000 days | Status: SS
Start: 2018-11-10 — End: 2018-11-30

## 2018-11-10 MED ORDER — ERGOCALCIFEROL (VITAMIN D2) 1,250 MCG (50,000 UNIT) CAPSULE
ORAL_CAPSULE | ORAL | 0 refills | 28 days | Status: CP
Start: 2018-11-10 — End: 2018-12-12
  Filled 2018-11-14: qty 4, 28d supply, fill #0

## 2018-11-10 NOTE — Unmapped (Addendum)
Patient's lab results today with K-5.7, tac level at goal max. Patient's [place] not diagnostic of any liver concerns. NP Martin-Velez recommended he take 30g kayexalate today. hydrate well and could use 5mg  flexeril q 8 hrs prn for back/flank pain.She also wanted his tac reduced to 2.5mg  bid. Spoke to patient who denied having any palpitations, chest pain, sob or dizziness. He said he continues to have the flank pain, which improves with a hot bath. Suggested that this may indicate it is muscular in nature and that he may benefit from the flexeril. Relayed recommendation for kayexalate, to hydrate and eliminate low protein, high K foods from his diet over the weekend. He verbalized understanding and agreement with plan.

## 2018-11-14 DIAGNOSIS — Z79899 Other long term (current) drug therapy: Secondary | ICD-10-CM

## 2018-11-14 DIAGNOSIS — Z944 Liver transplant status: Secondary | ICD-10-CM

## 2018-11-14 DIAGNOSIS — E612 Magnesium deficiency: Secondary | ICD-10-CM

## 2018-11-14 MED FILL — TACROLIMUS 0.5 MG CAPSULE: 30 days supply | Qty: 300 | Fill #0 | Status: AC

## 2018-11-14 MED FILL — SULFAMETHOXAZOLE 400 MG-TRIMETHOPRIM 80 MG TABLET: ORAL | 28 days supply | Qty: 12 | Fill #1

## 2018-11-14 MED FILL — ERGOCALCIFEROL (VITAMIN D2) 1,250 MCG (50,000 UNIT) CAPSULE: 28 days supply | Qty: 4 | Fill #0 | Status: AC

## 2018-11-14 MED FILL — MYCOPHENOLATE SODIUM 180 MG TABLET,DELAYED RELEASE: ORAL | 30 days supply | Qty: 120 | Fill #2

## 2018-11-14 MED FILL — VALGANCICLOVIR 450 MG TABLET: ORAL | 30 days supply | Qty: 30 | Fill #1

## 2018-11-14 MED FILL — MYCOPHENOLATE SODIUM 180 MG TABLET,DELAYED RELEASE: 30 days supply | Qty: 120 | Fill #2 | Status: AC

## 2018-11-14 MED FILL — VALGANCICLOVIR 450 MG TABLET: 30 days supply | Qty: 30 | Fill #1 | Status: AC

## 2018-11-14 MED FILL — SULFAMETHOXAZOLE 400 MG-TRIMETHOPRIM 80 MG TABLET: 28 days supply | Qty: 12 | Fill #1 | Status: AC

## 2018-11-15 DIAGNOSIS — Z944 Liver transplant status: Secondary | ICD-10-CM

## 2018-11-15 DIAGNOSIS — Z79899 Other long term (current) drug therapy: Secondary | ICD-10-CM

## 2018-11-15 DIAGNOSIS — E612 Magnesium deficiency: Secondary | ICD-10-CM

## 2018-11-15 LAB — CBC W/ DIFFERENTIAL
BANDED NEUTROPHILS ABSOLUTE COUNT: 0.1 10*3/uL (ref 0.0–0.1)
BASOPHILS ABSOLUTE COUNT: 0.2 10*3/uL (ref 0.0–0.2)
BASOPHILS RELATIVE PERCENT: 2 %
EOSINOPHILS ABSOLUTE COUNT: 0.6 10*3/uL — ABNORMAL HIGH (ref 0.0–0.4)
HEMATOCRIT: 40.1 % (ref 37.5–51.0)
HEMOGLOBIN: 13.3 g/dL (ref 13.0–17.7)
IMMATURE GRANULOCYTES: 1 %
LYMPHOCYTES ABSOLUTE COUNT: 2 10*3/uL (ref 0.7–3.1)
LYMPHOCYTES RELATIVE PERCENT: 19 %
MEAN CORPUSCULAR HEMOGLOBIN: 28.4 pg (ref 26.6–33.0)
MEAN CORPUSCULAR VOLUME: 86 fL (ref 79–97)
MONOCYTES ABSOLUTE COUNT: 0.6 10*3/uL (ref 0.1–0.9)
MONOCYTES RELATIVE PERCENT: 6 %
NEUTROPHILS ABSOLUTE COUNT: 6.8 10*3/uL (ref 1.4–7.0)
NEUTROPHILS RELATIVE PERCENT: 66 %
PLATELET COUNT: 246 10*3/uL (ref 150–450)
RED BLOOD CELL COUNT: 4.68 x10E6/uL (ref 4.14–5.80)
WHITE BLOOD CELL COUNT: 10.2 10*3/uL (ref 3.4–10.8)

## 2018-11-15 LAB — BILIRUBIN DIRECT: Bilirubin.glucuronidated+Bilirubin.albumin bound:MCnc:Pt:Ser/Plas:Qn:: 0.18

## 2018-11-15 LAB — COMPREHENSIVE METABOLIC PANEL
A/G RATIO: 2.1 (ref 1.2–2.2)
ALBUMIN: 4.7 g/dL (ref 3.8–4.9)
ALT (SGPT): 26 IU/L (ref 0–44)
AST (SGOT): 26 IU/L (ref 0–40)
BILIRUBIN TOTAL: 0.4 mg/dL (ref 0.0–1.2)
BLOOD UREA NITROGEN: 30 mg/dL — ABNORMAL HIGH (ref 6–24)
BUN / CREAT RATIO: 29 — ABNORMAL HIGH (ref 9–20)
CALCIUM: 10.1 mg/dL (ref 8.7–10.2)
CHLORIDE: 101 mmol/L (ref 96–106)
CO2: 21 mmol/L (ref 20–29)
CREATININE: 1.04 mg/dL (ref 0.76–1.27)
GLOBULIN, TOTAL: 2.2 g/dL (ref 1.5–4.5)
GLUCOSE: 119 mg/dL — ABNORMAL HIGH (ref 65–99)
POTASSIUM: 5.2 mmol/L (ref 3.5–5.2)
SODIUM: 139 mmol/L (ref 134–144)
TOTAL PROTEIN: 6.9 g/dL (ref 6.0–8.5)

## 2018-11-15 LAB — CHLORIDE: Chloride:SCnc:Pt:Ser/Plas:Qn:: 101

## 2018-11-15 LAB — TACROLIMUS BLOOD: Tacrolimus:MCnc:Pt:Bld:Qn:LC/MS/MS: 10.1

## 2018-11-15 LAB — MEAN CORPUSCULAR VOLUME: Erythrocyte mean corpuscular volume:EntVol:Pt:RBC:Qn:Automated count: 86

## 2018-11-15 LAB — GAMMA GLUTAMYL TRANSFERASE: Gamma glutamyl transferase:CCnc:Pt:Ser/Plas:Qn:: 47

## 2018-11-15 LAB — MAGNESIUM: Magnesium:MCnc:Pt:Ser/Plas:Qn:: 1.7

## 2018-11-15 LAB — PHOSPHORUS, SERUM: Phosphate:MCnc:Pt:Ser/Plas:Qn:: 3.7

## 2018-11-15 NOTE — Unmapped (Signed)
Called pt to discuss scheduling SW appt for his 3 mo check up that this [facility] was unable to schedule when pt transplanted. Pt ok with having virtual SW appt on 10/27 from home since nothing available on day of 3 mo 10/26. Explained how [employer] works and told him letter would be coming thru [place]. He verbalized understanding of all discussed.

## 2018-11-17 DIAGNOSIS — E612 Magnesium deficiency: Secondary | ICD-10-CM

## 2018-11-17 DIAGNOSIS — Z944 Liver transplant status: Secondary | ICD-10-CM

## 2018-11-17 DIAGNOSIS — Z79899 Other long term (current) drug therapy: Secondary | ICD-10-CM

## 2018-11-18 DIAGNOSIS — E612 Magnesium deficiency: Secondary | ICD-10-CM

## 2018-11-18 DIAGNOSIS — E559 Vitamin D deficiency, unspecified: Secondary | ICD-10-CM

## 2018-11-18 DIAGNOSIS — Z5181 Encounter for therapeutic drug level monitoring: Secondary | ICD-10-CM

## 2018-11-18 DIAGNOSIS — E789 Disorder of lipoprotein metabolism, unspecified: Secondary | ICD-10-CM

## 2018-11-18 DIAGNOSIS — Z944 Liver transplant status: Secondary | ICD-10-CM

## 2018-11-18 DIAGNOSIS — R739 Hyperglycemia, unspecified: Secondary | ICD-10-CM

## 2018-11-18 DIAGNOSIS — C220 Liver cell carcinoma: Secondary | ICD-10-CM

## 2018-11-18 LAB — COMPREHENSIVE METABOLIC PANEL
A/G RATIO: 2 (ref 1.2–2.2)
ALKALINE PHOSPHATASE: 118 IU/L — ABNORMAL HIGH (ref 39–117)
ALT (SGPT): 25 IU/L (ref 0–44)
AST (SGOT): 21 IU/L (ref 0–40)
BILIRUBIN TOTAL: 0.4 mg/dL (ref 0.0–1.2)
BLOOD UREA NITROGEN: 26 mg/dL — ABNORMAL HIGH (ref 6–24)
BUN / CREAT RATIO: 22 — ABNORMAL HIGH (ref 9–20)
CALCIUM: 10.3 mg/dL — ABNORMAL HIGH (ref 8.7–10.2)
CHLORIDE: 102 mmol/L (ref 96–106)
CREATININE: 1.17 mg/dL (ref 0.76–1.27)
POTASSIUM: 5.2 mmol/L (ref 3.5–5.2)
SODIUM: 138 mmol/L (ref 134–144)
TOTAL PROTEIN: 7 g/dL (ref 6.0–8.5)

## 2018-11-18 LAB — BILIRUBIN DIRECT: Bilirubin.glucuronidated+Bilirubin.albumin bound:MCnc:Pt:Ser/Plas:Qn:: 0.16

## 2018-11-18 LAB — CBC W/ DIFFERENTIAL
BANDED NEUTROPHILS ABSOLUTE COUNT: 0.1 10*3/uL (ref 0.0–0.1)
BASOPHILS ABSOLUTE COUNT: 0.2 10*3/uL (ref 0.0–0.2)
BASOPHILS RELATIVE PERCENT: 2 %
EOSINOPHILS ABSOLUTE COUNT: 0.4 10*3/uL (ref 0.0–0.4)
EOSINOPHILS RELATIVE PERCENT: 4 %
HEMATOCRIT: 42.2 % (ref 37.5–51.0)
HEMOGLOBIN: 13.7 g/dL (ref 13.0–17.7)
IMMATURE GRANULOCYTES: 1 %
LYMPHOCYTES ABSOLUTE COUNT: 2.1 10*3/uL (ref 0.7–3.1)
LYMPHOCYTES RELATIVE PERCENT: 22 %
MEAN CORPUSCULAR HEMOGLOBIN CONC: 32.5 g/dL (ref 31.5–35.7)
MONOCYTES RELATIVE PERCENT: 7 %
NEUTROPHILS ABSOLUTE COUNT: 6.4 10*3/uL (ref 1.4–7.0)
NEUTROPHILS RELATIVE PERCENT: 64 %
PLATELET COUNT: 279 10*3/uL (ref 150–450)
RED CELL DISTRIBUTION WIDTH: 14 % (ref 11.6–15.4)
WHITE BLOOD CELL COUNT: 9.9 10*3/uL (ref 3.4–10.8)

## 2018-11-18 LAB — WHITE BLOOD CELL COUNT: Leukocytes:NCnc:Pt:Bld:Qn:Automated count: 9.9

## 2018-11-18 LAB — MAGNESIUM: Magnesium:MCnc:Pt:Ser/Plas:Qn:: 1.7

## 2018-11-18 LAB — GAMMA GLUTAMYL TRANSFERASE: Gamma glutamyl transferase:CCnc:Pt:Ser/Plas:Qn:: 47

## 2018-11-18 LAB — SODIUM: Sodium:SCnc:Pt:Ser/Plas:Qn:: 138

## 2018-11-18 LAB — PHOSPHORUS, SERUM: Phosphate:MCnc:Pt:Ser/Plas:Qn:: 4.3 — ABNORMAL HIGH

## 2018-11-18 NOTE — Unmapped (Signed)
Patient's 9/17 liver US reviewed by NP Martin-Velez, who indicated no need for f/u on this in r/t patient's c/o rt.flank pain.

## 2018-11-19 LAB — TACROLIMUS BLOOD: Tacrolimus:MCnc:Pt:Bld:Qn:LC/MS/MS: 10.8

## 2018-11-21 DIAGNOSIS — Z944 Liver transplant status: Secondary | ICD-10-CM

## 2018-11-21 DIAGNOSIS — Z79899 Other long term (current) drug therapy: Secondary | ICD-10-CM

## 2018-11-21 DIAGNOSIS — I10 Essential (primary) hypertension: Secondary | ICD-10-CM

## 2018-11-21 DIAGNOSIS — R52 Pain, unspecified: Secondary | ICD-10-CM

## 2018-11-21 DIAGNOSIS — E612 Magnesium deficiency: Secondary | ICD-10-CM

## 2018-11-21 MED ORDER — AMLODIPINE 5 MG TABLET
ORAL_TABLET | Freq: Every day | ORAL | 11 refills | 15.00000 days | Status: CP
Start: 2018-11-21 — End: 2019-11-21

## 2018-11-21 MED ORDER — TACROLIMUS 0.5 MG CAPSULE
ORAL_CAPSULE | Freq: Two times a day (BID) | ORAL | 11 refills | 30.00000 days
Start: 2018-11-21 — End: ?

## 2018-11-21 NOTE — Unmapped (Signed)
Patient taking Tacrolimus 3 mg BID (not 2.5 mg BID as listed in Epic).  Complaining of headache.  With pain at the mid back and near incision.  It is an ache.  Twinges of pain. Touches has a strange feeling. Does confirm he is walking. Back pain had tried flexeril five times and not working. 1-3 BMs day well formed.  Taking oxycodone at night to help with the pain over the last two weeks about every other night. Warned about how transplant will not refill oxycodone.      Patient reported his BP is high   11/21/2018 AM BP 155/102 HR 82  11/20/2018 BP164/108  HR 81  11/19/2018 BP 149/98 HR 77  11/18/2018 BP 139/100 HR 84  9/24 BP 154/103 HR 85  9/23 BP 141/104 HR 84  9/22 BP 153/96 HR 77    Per NP Renee Rival patient to decrease dose of tacrolimus to 2.5 mg BID, increase to 10 mg amolodpine daily, and send pain clinic referral since patient medical workup for pain is all negative. Patient verbalized understanding.  Provided patient with pain clinic appointment phone number to call if he does not hear form them.

## 2018-11-22 DIAGNOSIS — E612 Magnesium deficiency: Secondary | ICD-10-CM

## 2018-11-22 DIAGNOSIS — Z79899 Other long term (current) drug therapy: Secondary | ICD-10-CM

## 2018-11-22 DIAGNOSIS — Z944 Liver transplant status: Secondary | ICD-10-CM

## 2018-11-22 DIAGNOSIS — R52 Pain, unspecified: Secondary | ICD-10-CM

## 2018-11-22 DIAGNOSIS — F40240 Claustrophobia: Secondary | ICD-10-CM

## 2018-11-22 DIAGNOSIS — F411 Generalized anxiety disorder: Secondary | ICD-10-CM

## 2018-11-22 LAB — COMPREHENSIVE METABOLIC PANEL
A/G RATIO: 1.8 (ref 1.2–2.2)
ALBUMIN: 4.8 g/dL (ref 3.8–4.9)
ALKALINE PHOSPHATASE: 119 IU/L — ABNORMAL HIGH (ref 39–117)
ALT (SGPT): 27 IU/L (ref 0–44)
AST (SGOT): 20 IU/L (ref 0–40)
BILIRUBIN TOTAL: 0.4 mg/dL (ref 0.0–1.2)
BLOOD UREA NITROGEN: 29 mg/dL — ABNORMAL HIGH (ref 6–24)
BUN / CREAT RATIO: 22 — ABNORMAL HIGH (ref 9–20)
CALCIUM: 9.9 mg/dL (ref 8.7–10.2)
CREATININE: 1.29 mg/dL — ABNORMAL HIGH (ref 0.76–1.27)
GLOBULIN, TOTAL: 2.6 g/dL (ref 1.5–4.5)
GLUCOSE: 118 mg/dL — ABNORMAL HIGH (ref 65–99)
POTASSIUM: 5.7 mmol/L — ABNORMAL HIGH (ref 3.5–5.2)
SODIUM: 139 mmol/L (ref 134–144)
TOTAL PROTEIN: 7.4 g/dL (ref 6.0–8.5)

## 2018-11-22 LAB — CBC W/ DIFFERENTIAL
BANDED NEUTROPHILS ABSOLUTE COUNT: 0.1 10*3/uL (ref 0.0–0.1)
BASOPHILS ABSOLUTE COUNT: 0.1 10*3/uL (ref 0.0–0.2)
BASOPHILS RELATIVE PERCENT: 2 %
EOSINOPHILS ABSOLUTE COUNT: 0.4 10*3/uL (ref 0.0–0.4)
EOSINOPHILS RELATIVE PERCENT: 5 %
HEMATOCRIT: 44.2 % (ref 37.5–51.0)
HEMOGLOBIN: 14.2 g/dL (ref 13.0–17.7)
IMMATURE GRANULOCYTES: 1 %
LYMPHOCYTES ABSOLUTE COUNT: 2.1 10*3/uL (ref 0.7–3.1)
LYMPHOCYTES RELATIVE PERCENT: 23 %
MEAN CORPUSCULAR HEMOGLOBIN CONC: 32.1 g/dL (ref 31.5–35.7)
MEAN CORPUSCULAR HEMOGLOBIN: 28 pg (ref 26.6–33.0)
MEAN CORPUSCULAR VOLUME: 87 fL (ref 79–97)
MONOCYTES ABSOLUTE COUNT: 0.6 10*3/uL (ref 0.1–0.9)
MONOCYTES RELATIVE PERCENT: 7 %
NEUTROPHILS ABSOLUTE COUNT: 5.7 10*3/uL (ref 1.4–7.0)
NEUTROPHILS RELATIVE PERCENT: 62 %
RED CELL DISTRIBUTION WIDTH: 13.9 % (ref 11.6–15.4)

## 2018-11-22 LAB — GAMMA GLUTAMYL TRANSFERASE: Gamma glutamyl transferase:CCnc:Pt:Ser/Plas:Qn:: 49

## 2018-11-22 LAB — BUN / CREAT RATIO: Urea nitrogen/Creatinine:MRto:Pt:Ser/Plas:Qn:: 22 — ABNORMAL HIGH

## 2018-11-22 LAB — PHOSPHORUS, SERUM: Phosphate:MCnc:Pt:Ser/Plas:Qn:: 4.4 — ABNORMAL HIGH

## 2018-11-22 LAB — BILIRUBIN DIRECT: Bilirubin.glucuronidated+Bilirubin.albumin bound:MCnc:Pt:Ser/Plas:Qn:: 0.17

## 2018-11-22 LAB — MAGNESIUM: Magnesium:MCnc:Pt:Ser/Plas:Qn:: 1.6

## 2018-11-22 LAB — HEMATOCRIT: Hematocrit:VFr:Pt:Bld:Qn:Automated count: 44.2

## 2018-11-22 MED ORDER — LORAZEPAM 0.5 MG TABLET
ORAL_TABLET | 0 refills | 0 days | Status: SS
Start: 2018-11-22 — End: 2018-11-30

## 2018-11-22 NOTE — Unmapped (Signed)
Called patient back (refer to previous message) letting him know that Gertie Fey, NP wrote back that she will send to his pharmacy a pill to take to 1 hour before the CT and then another to take right before the CT. Mentioned he could take this with a sip of water. Verbalized understanding.

## 2018-11-22 NOTE — Unmapped (Addendum)
Reviewed pt's 11/21/2018 labs with K+ 5.7. When discussed foods high in K+ that he had potatoes/potato chips this past weekend.   Mentioned seeing that covering coordinator spoke to him about decreasing his tacrolimus dosage based on 9/24 tac level. Reviewed 9/28 labs with him. Mentioned tac/prograf can increase K+ and be tough on kidneys. Discussed increasing hydration and to work towards 70-100oz of fluid.   Patient discussed his back pain. Mentioned to him that covering coordinator note from yesterday discusses how she reviewed his symptoms/pain and his other concerns with provider yesterday and that this is what lead to the medication changes and the pain referral.   Patient confirmed he had the phone # to the pain clinic in case he has not heard anything in a couple of days in terms of scheduling. Verbalized understanding that if the pain clinic cannot see him soon for him to f/u with his PCP in the interim for pain management; confirmed he does have PCP. Has tried heat on his back and he confirmed he is walking/moving around. He was currently laying on his back as he was talking. Discussed placing a pillow under his legs to decrease the pressure on his back or lay on his side.  Patient confirmed he is repeating labs on Thursday.   Sent message to Gertie Fey, NP about his K+ and he had eaten potatoes and potato chips; also mentioned that we discussed in addition to his pain referral that if the pain appointment cannot be made soon for him to f/u with PCP for back pain management. Amil Amen was fine with no further intervention with K+ since this coordinator discussed increasing hydration, his tac was decreased yesterday, and he is repeating labs on Thursday.   Amil Amen also mentioned she sent a message to Dr. Celine Mans about pt's pain issue in case there was any additional intervention to be done.     Received message back from [place] after discussing patient with Dr. Celine Mans for patient to have Chest, abd & pelvis CT with and without contrast to r/o any potential cause of back pain and pain around incision.     Entered order and sent message in checklist for this to be scheduled to TPA.     Called patient letting him know that there will not be any further intervention related to his K+ other than what we discussed decreasing intake of foods such as potato chips high in K+, increasing and hydration and labs on Thursday. Mentioned that Amil Amen and Dr. Celine Mans discussed that in addition to his pain referral and if needed following up with his PCP, that we are doing a CT of his chest, abd and pelvis to ensure there is no other cause for his pain. Aware that TPA will call him this week with the date/time of this.

## 2018-11-22 NOTE — Unmapped (Signed)
TPA mentioned that patient is scheduled for Friday, Oct 2 for his CT for his chest, pelvis and abdomen to r/o any causes for his back pain and pain at his txp incision.   Patient reported that patient is claustrophobic. Forwarded this information to Gertie Fey, NP who is fine to prescribe ativan if needed.     Talked with patient and he was fine with ativan and mentioned this is typically used for anxiety typically relaxes and makes people sleepy. He confirmed the local [facility] on [address] and sent this in email to [place].   Patient verbalized understanding to:  1) Bring a driver since he will be taking ativan prior to his CT  2) Take the ativan 45 minutes to 1 hour prior to CT on Friday, Oct 2  3) Let radiologist know that he is claustrophobic and he took ativan for this    Aware this will be different from MRI.     Patient aware that provider will either send script today or tomorrow and to call [facility] tomorrow to check to see if there.

## 2018-11-22 NOTE — Unmapped (Signed)
Addended by: Nigel Bridgeman on: 11/22/2018 01:22 PM     Modules accepted: Orders

## 2018-11-22 NOTE — Unmapped (Signed)
Called pt re his availability for chest/abd/pelv CT. Pt now scheduled for 10/2 at 10:30 am at hospital. Discussed 2 hr NPO. Pt then said he is claustrophobic. Told him CT machine is larger than MRI but that this TPA would share info with covering coord and someone would call him to discuss this if needed. He verbalized understanding of all discussed

## 2018-11-23 ENCOUNTER — Encounter: Payer: Self-pay | Admitting: Family Medicine

## 2018-11-23 ENCOUNTER — Other Ambulatory Visit: Payer: Self-pay

## 2018-11-23 ENCOUNTER — Ambulatory Visit (INDEPENDENT_AMBULATORY_CARE_PROVIDER_SITE_OTHER): Payer: BC Managed Care – PPO | Admitting: Family Medicine

## 2018-11-23 VITALS — BP 140/94 | HR 88 | Temp 98.7°F | Ht 68.0 in | Wt 177.1 lb

## 2018-11-23 DIAGNOSIS — R071 Chest pain on breathing: Secondary | ICD-10-CM | POA: Diagnosis not present

## 2018-11-23 DIAGNOSIS — M7918 Myalgia, other site: Secondary | ICD-10-CM

## 2018-11-23 LAB — TACROLIMUS BLOOD: Tacrolimus:MCnc:Pt:Bld:Qn:LC/MS/MS: 12.4

## 2018-11-23 MED ORDER — TIZANIDINE HCL 2 MG PO CAPS: 2.0000 mg | ORAL_CAPSULE | Freq: Three times a day (TID) | ORAL | 0 refills | Status: DC

## 2018-11-23 MED ORDER — CHOLECALCIFEROL (VITAMIN D3) 25 MCG (1,000 UNIT) TABLET
ORAL_TABLET | Freq: Every day | ORAL | 11 refills | 30.00000 days | Status: CP
Start: 2018-11-23 — End: 2018-09-28

## 2018-11-23 NOTE — Patient Instructions (Signed)
Good to see you today   I think the root of your pain is in the muscles and rib cage  Use your pain medication as directed as well as tylenol.  I have sent in a different muscle relaxer.   Definitely mention to your transplant team.

## 2018-11-23 NOTE — Progress Notes (Signed)
Subjective:    Patient ID: [name], male    DOB: 1960/12/12, 58 y.o.   MRN: [id number]  HPI Chief Complaint  Patient presents with  . Back Pain    Pt notes having increased Rib and back pain since liver transplant surgery 09/16/2018. Pt states that the pain is not radiating around to front rib section and stomach area. Pt states that at times he has some breathing issues - they knicked his lung during the biopsy. Some left chest/lung soreness with deep breaths last night.  BP elevated, 160s/100s   This is a 58 yo male who presents with the above concerns. He had liver transplant 08/2018 and continues to be closely followed by [facility] liver transplant team.  Patient reports that this pain started with his surgery but seems to be worse over the last several days.  The pain starts at his mid back and comes around towards his abdomen along his rib cage.  Movement makes it worse.  Deep breathing makes it worse.  He does find that he is not moving around as much over the last couple of days to avoid exacerbating the pain.  He has been taking 2 Tylenol twice a day as well as his pain medication 1 time a day.  He has been working hard to use as little narcotic pain medication as possible.  He was given some cyclobenzaprine which he does not find helpful.  He was told by his transplant team that they or his PCP could try a different muscle relaxer if needed.  He does not feel that his breathing is any worse.  He has not had any cough, fever, diarrhea/constipation or change in urination.  Past Medical History:  Diagnosis Date  . Allergy   . Asthma   . Bicuspid aortic valve    sees dr Harrington Challenger  . Cirrhosis ([place])   . Diabetes mellitus without complication ([place])   . Fatty liver    with h/o elevated LFT's  . GERD (gastroesophageal reflux disease)   . Heart murmur   . Hypertension   . Itching    all over last few months  . Jaundice   . Liver transplant recipient [facility])    09/16/2018 at [facility]  . Migraine  with aura   . OSA (obstructive sleep apnea) 09/14/2011   PSG 11/08/11>>AHI 31.6, SpO2 low 85%. wears CPAP, pt does not know settings  . Other abnormal glucose    diet controlled diabetic  . Thyroid disease    Past Surgical History:  Procedure Laterality Date  . APPENDECTOMY  10/09   Emergency  . BIOPSY THYROID  08/19/07   Attempted, no tissue obtained  . CARDIAC CATHETERIZATION  03/21/2018  . CARDIOVASCULAR STRESS TEST  03/07   Negative 06/05  . DOPPLER ECHOCARDIOGRAPHY  06/04  . ESOPHAGEAL BANDING N/A 05/04/2016   Procedure: ESOPHAGEAL BANDING;  Surgeon: [name], MD;  Location: [facility] ENDOSCOPY;  Service: Endoscopy;  Laterality: N/A;  . ESOPHAGOGASTRODUODENOSCOPY (EGD) WITH PROPOFOL N/A 05/04/2016   Procedure: ESOPHAGOGASTRODUODENOSCOPY (EGD) WITH PROPOFOL;  Surgeon: [name], MD;  Location: [facility] ENDOSCOPY;  Service: Endoscopy;  Laterality: N/A;  . LIVER TRANSPLANT     09/16/2018 at [facility]   Family History  Problem Relation Age of Onset  . Asthma Mother   . Cancer Father        Died when pt was 71 of CA with mets, site unknown, COPD  . COPD Father   . Heart disease Sister  Heart stopped  . Prostate cancer Maternal Uncle   . Colon cancer Maternal Grandmother   . Heart disease Maternal Grandfather        MI, 1 YOA  . Liver disease Sister        "gene" for liver disease  . Esophageal cancer Neg Hx   . Rectal cancer Neg Hx   . Stomach cancer Neg Hx    Social History   Tobacco Use  . Smoking status: Never Smoker  . Smokeless tobacco: Never Used  Substance Use Topics  . Alcohol use: No  . Drug use: No      Review of Systems Per HPI    Objective:   Physical Exam Vitals signs reviewed.  Constitutional:      General: He is not in acute distress.    Appearance: Normal appearance. He is normal weight. He is not ill-appearing, toxic-appearing or diaphoretic.  HENT:     Head: Normocephalic and atraumatic.  Eyes:     Conjunctiva/sclera: Conjunctivae normal.   Cardiovascular:     Rate and Rhythm: Normal rate and regular rhythm.     Heart sounds: Normal heart sounds.  Pulmonary:     Effort: Pulmonary effort is normal.     Breath sounds: Normal breath sounds.  Abdominal:     General: Abdomen is flat. Bowel sounds are normal. There is no distension.     Palpations: Abdomen is soft. There is no mass.     Tenderness: There is abdominal tenderness (along right upper rib cage). There is no guarding or rebound.     Hernia: No hernia is present.     Comments: Well-healed surgical scars present.  Musculoskeletal:     Right lower leg: No edema.     Left lower leg: Edema present.  Skin:    General: Skin is warm and dry.  Neurological:     Mental Status: He is alert and oriented to person, place, and time.  Psychiatric:        Mood and Affect: Mood normal.        Behavior: Behavior normal.        Thought Content: Thought content normal.        Judgment: Judgment normal.       BP (!) 140/94 (BP Location: Left Arm, Patient Position: Sitting, Cuff Size: Normal)   Pulse 88   Temp 98.7 F (37.1 C) (Temporal)   Ht 5\' 8"  (1.727 m)   Wt 177 lb 1.9 oz (80.3 kg)   SpO2 100%   BMI 26.93 kg/m  Wt Readings from Last 3 Encounters:  11/23/18 177 lb 1.9 oz (80.3 kg)  10/28/18 178 lb 3 oz (80.8 kg)  04/25/18 188 lb 1.9 oz (85.3 kg)   BP Readings from Last 3 Encounters:  11/23/18 (!) 140/94  10/28/18 (!) 142/84  04/25/18 122/76   EKG- rate 88, PR 162, QT 348. Sinus rhythm.  RSR in V1 and voltage criteria for LVH.  This was compared to recent EKG report in care everywhere.  Unable to see tracing but interpretation similar to today's.    Assessment & Plan:  1. Chest pain on breathing -Do not think this is cardiac in nature and reassured patient -We did review labs that were done earlier this week and noted some elevated potassium and creatinine.  He has repeat labs scheduled for tomorrow. -He also has follow-up CT scheduled in 2 days. - EKG 12-Lead   2. Musculoskeletal pain - tizanidine (ZANAFLEX) 2 MG capsule; Take  1 capsule (2 mg total) by mouth 3 (three) times daily.  Dispense: 30 capsule; Refill: 0 -Continue current acetaminophen regimen and pain medication, can take pain medication as frequently as prescribed as needed  -Follow-up precautions reviewed with patient Clarene Reamer, FNP-BC  [facility], [place]  11/24/2018 8:36 AM

## 2018-11-24 ENCOUNTER — Encounter: Payer: Self-pay | Admitting: Family Medicine

## 2018-11-24 DIAGNOSIS — Z79899 Other long term (current) drug therapy: Secondary | ICD-10-CM

## 2018-11-24 DIAGNOSIS — E612 Magnesium deficiency: Secondary | ICD-10-CM

## 2018-11-24 DIAGNOSIS — Z944 Liver transplant status: Secondary | ICD-10-CM

## 2018-11-24 NOTE — Unmapped (Signed)
Received VM from patient reporting his PCP prescribed tiZANidine for his back pain.  Messaged Pharmacy Float Sonda Rumble to see if this medication is safe.    Per pharmacist medication is safe but  can cause drowsiness/dizziness and possibly hypotension, so just should not take with other sedating meds (i.e. oxycodone, lorazepam) and monitor BP regularly if possible. Let patient know this and to not take it with the flexeril.  He verbalized understanding and reported it worked a bit better then the flexeril did last night.

## 2018-11-25 ENCOUNTER — Encounter: Admit: 2018-11-25 | Discharge: 2018-11-26 | Payer: MEDICARE

## 2018-11-25 DIAGNOSIS — Z944 Liver transplant status: Secondary | ICD-10-CM

## 2018-11-25 DIAGNOSIS — J90 Pleural effusion, not elsewhere classified: Secondary | ICD-10-CM

## 2018-11-25 DIAGNOSIS — R52 Pain, unspecified: Secondary | ICD-10-CM

## 2018-11-25 DIAGNOSIS — M549 Dorsalgia, unspecified: Secondary | ICD-10-CM | POA: Diagnosis not present

## 2018-11-25 DIAGNOSIS — R932 Abnormal findings on diagnostic imaging of liver and biliary tract: Secondary | ICD-10-CM | POA: Diagnosis not present

## 2018-11-25 LAB — CBC W/ DIFFERENTIAL
BANDED NEUTROPHILS ABSOLUTE COUNT: 0.1 10*3/uL (ref 0.0–0.1)
BASOPHILS RELATIVE PERCENT: 2 %
EOSINOPHILS ABSOLUTE COUNT: 0.4 10*3/uL (ref 0.0–0.4)
EOSINOPHILS RELATIVE PERCENT: 4 %
HEMATOCRIT: 43.3 % (ref 37.5–51.0)
HEMOGLOBIN: 14.1 g/dL (ref 13.0–17.7)
IMMATURE GRANULOCYTES: 1 %
LYMPHOCYTES ABSOLUTE COUNT: 2.1 10*3/uL (ref 0.7–3.1)
LYMPHOCYTES RELATIVE PERCENT: 23 %
MEAN CORPUSCULAR HEMOGLOBIN CONC: 32.6 g/dL (ref 31.5–35.7)
MEAN CORPUSCULAR HEMOGLOBIN: 28.1 pg (ref 26.6–33.0)
MEAN CORPUSCULAR VOLUME: 86 fL (ref 79–97)
MONOCYTES ABSOLUTE COUNT: 0.7 10*3/uL (ref 0.1–0.9)
MONOCYTES RELATIVE PERCENT: 8 %
NEUTROPHILS ABSOLUTE COUNT: 5.5 10*3/uL (ref 1.4–7.0)
PLATELET COUNT: 293 10*3/uL (ref 150–450)
RED BLOOD CELL COUNT: 5.01 x10E6/uL (ref 4.14–5.80)
RED CELL DISTRIBUTION WIDTH: 14.2 % (ref 11.6–15.4)
WHITE BLOOD CELL COUNT: 8.9 10*3/uL (ref 3.4–10.8)

## 2018-11-25 LAB — BILIRUBIN TOTAL: Bilirubin:MCnc:Pt:Ser/Plas:Qn:: 0.3

## 2018-11-25 LAB — PLATELET COUNT: Platelets:NCnc:Pt:Bld:Qn:Automated count: 293

## 2018-11-25 LAB — COMPREHENSIVE METABOLIC PANEL
A/G RATIO: 2.1 (ref 1.2–2.2)
ALKALINE PHOSPHATASE: 115 IU/L (ref 39–117)
ALT (SGPT): 19 IU/L (ref 0–44)
AST (SGOT): 17 IU/L (ref 0–40)
BILIRUBIN TOTAL: 0.3 mg/dL (ref 0.0–1.2)
BLOOD UREA NITROGEN: 40 mg/dL — ABNORMAL HIGH (ref 6–24)
BUN / CREAT RATIO: 28 — ABNORMAL HIGH (ref 9–20)
CALCIUM: 10.4 mg/dL — ABNORMAL HIGH (ref 8.7–10.2)
CHLORIDE: 101 mmol/L (ref 96–106)
CO2: 21 mmol/L (ref 20–29)
CREATININE: 1.44 mg/dL — ABNORMAL HIGH (ref 0.76–1.27)
GLOBULIN, TOTAL: 2.3 g/dL (ref 1.5–4.5)
GLUCOSE: 124 mg/dL — ABNORMAL HIGH (ref 65–99)
POTASSIUM: 5.6 mmol/L — ABNORMAL HIGH (ref 3.5–5.2)
SODIUM: 138 mmol/L (ref 134–144)
TOTAL PROTEIN: 7.2 g/dL (ref 6.0–8.5)

## 2018-11-25 LAB — GAMMA GLUTAMYL TRANSFERASE: Gamma glutamyl transferase:CCnc:Pt:Ser/Plas:Qn:: 44

## 2018-11-25 LAB — BILIRUBIN DIRECT: Bilirubin.glucuronidated+Bilirubin.albumin bound:MCnc:Pt:Ser/Plas:Qn:: 0.15

## 2018-11-25 LAB — PHOSPHORUS, SERUM: Phosphate:MCnc:Pt:Ser/Plas:Qn:: 4.8 — ABNORMAL HIGH

## 2018-11-25 LAB — MAGNESIUM: Magnesium:MCnc:Pt:Ser/Plas:Qn:: 1.6

## 2018-11-26 LAB — TACROLIMUS BLOOD: Tacrolimus:MCnc:Pt:Bld:Qn:LC/MS/MS: 8.9

## 2018-11-28 DIAGNOSIS — E612 Magnesium deficiency: Secondary | ICD-10-CM

## 2018-11-28 DIAGNOSIS — Z79899 Other long term (current) drug therapy: Secondary | ICD-10-CM

## 2018-11-28 DIAGNOSIS — Z944 Liver transplant status: Secondary | ICD-10-CM

## 2018-11-28 NOTE — Unmapped (Signed)
Received inbasket msg from Dr. Celine Mans (regarding 10/2 chest and abdomen CT results) recommending a visit with either him or Dr. Matilde Haymaker next week. Sent pt MyChart msg notifying him of plan and asked TPA to schedule.

## 2018-11-28 NOTE — Unmapped (Signed)
Sent MyChart msg to pt reminding him to hydrate since his creatinine is slightly elevated.

## 2018-11-29 ENCOUNTER — Encounter: Admit: 2018-11-29 | Discharge: 2018-12-01 | Payer: MEDICARE

## 2018-11-29 ENCOUNTER — Ambulatory Visit: Admit: 2018-11-29 | Discharge: 2018-12-01 | Payer: MEDICARE

## 2018-11-29 DIAGNOSIS — C220 Liver cell carcinoma: Secondary | ICD-10-CM

## 2018-11-29 DIAGNOSIS — K7460 Unspecified cirrhosis of liver: Secondary | ICD-10-CM

## 2018-11-29 DIAGNOSIS — Z20828 Contact with and (suspected) exposure to other viral communicable diseases: Secondary | ICD-10-CM

## 2018-11-29 DIAGNOSIS — I10 Essential (primary) hypertension: Secondary | ICD-10-CM

## 2018-11-29 DIAGNOSIS — E612 Magnesium deficiency: Secondary | ICD-10-CM

## 2018-11-29 DIAGNOSIS — R079 Chest pain, unspecified: Secondary | ICD-10-CM

## 2018-11-29 DIAGNOSIS — K8309 Other cholangitis: Secondary | ICD-10-CM

## 2018-11-29 DIAGNOSIS — R188 Other ascites: Secondary | ICD-10-CM

## 2018-11-29 DIAGNOSIS — J90 Pleural effusion, not elsewhere classified: Secondary | ICD-10-CM

## 2018-11-29 DIAGNOSIS — E119 Type 2 diabetes mellitus without complications: Secondary | ICD-10-CM

## 2018-11-29 DIAGNOSIS — Z79899 Other long term (current) drug therapy: Secondary | ICD-10-CM

## 2018-11-29 DIAGNOSIS — Z944 Liver transplant status: Secondary | ICD-10-CM

## 2018-11-29 DIAGNOSIS — I77810 Thoracic aortic ectasia: Secondary | ICD-10-CM | POA: Diagnosis not present

## 2018-11-29 DIAGNOSIS — R161 Splenomegaly, not elsewhere classified: Secondary | ICD-10-CM | POA: Diagnosis not present

## 2018-11-29 DIAGNOSIS — Z5181 Encounter for therapeutic drug level monitoring: Secondary | ICD-10-CM | POA: Diagnosis not present

## 2018-11-29 DIAGNOSIS — R918 Other nonspecific abnormal finding of lung field: Secondary | ICD-10-CM | POA: Diagnosis not present

## 2018-11-29 DIAGNOSIS — R0789 Other chest pain: Secondary | ICD-10-CM | POA: Diagnosis not present

## 2018-11-29 LAB — CBC W/ DIFFERENTIAL
BANDED NEUTROPHILS ABSOLUTE COUNT: 0.1 10*3/uL (ref 0.0–0.1)
BASOPHILS ABSOLUTE COUNT: 0.1 10*3/uL (ref 0.0–0.2)
BASOPHILS RELATIVE PERCENT: 1 %
EOSINOPHILS ABSOLUTE COUNT: 0.4 10*3/uL (ref 0.0–0.4)
EOSINOPHILS RELATIVE PERCENT: 5 %
HEMATOCRIT: 43.6 % (ref 37.5–51.0)
IMMATURE GRANULOCYTES: 1 %
LYMPHOCYTES ABSOLUTE COUNT: 1.7 10*3/uL (ref 0.7–3.1)
LYMPHOCYTES RELATIVE PERCENT: 19 %
MEAN CORPUSCULAR HEMOGLOBIN CONC: 33 g/dL (ref 31.5–35.7)
MEAN CORPUSCULAR HEMOGLOBIN: 27.7 pg (ref 26.6–33.0)
MEAN CORPUSCULAR VOLUME: 84 fL (ref 79–97)
MONOCYTES ABSOLUTE COUNT: 0.6 10*3/uL (ref 0.1–0.9)
MONOCYTES RELATIVE PERCENT: 7 %
NEUTROPHILS ABSOLUTE COUNT: 6 10*3/uL (ref 1.4–7.0)
PLATELET COUNT: 325 10*3/uL (ref 150–450)
RED BLOOD CELL COUNT: 5.19 x10E6/uL (ref 4.14–5.80)
RED CELL DISTRIBUTION WIDTH: 13.6 % (ref 11.6–15.4)
WHITE BLOOD CELL COUNT: 9 10*3/uL (ref 3.4–10.8)

## 2018-11-29 LAB — COMPREHENSIVE METABOLIC PANEL
A/G RATIO: 2.1 (ref 1.2–2.2)
ALBUMIN: 4.9 g/dL (ref 3.8–4.9)
ALBUMIN: 4.9 g/dL (ref 3.5–5.0)
ALKALINE PHOSPHATASE: 118 IU/L — ABNORMAL HIGH (ref 39–117)
ALKALINE PHOSPHATASE: 98 U/L (ref 38–126)
ALT (SGPT): 21 U/L (ref <50)
ANION GAP: 13 mmol/L (ref 7–15)
AST (SGOT): 18 IU/L (ref 0–40)
AST (SGOT): 29 U/L (ref 19–55)
BILIRUBIN TOTAL: 0.4 mg/dL (ref 0.0–1.2)
BILIRUBIN TOTAL: 0.7 mg/dL (ref 0.0–1.2)
BLOOD UREA NITROGEN: 21 mg/dL (ref 6–24)
BUN / CREAT RATIO: 19 (ref 9–20)
BUN / CREAT RATIO: 24
CHLORIDE: 102 mmol/L (ref 98–107)
CO2: 21 mmol/L (ref 20–29)
CO2: 23 mmol/L (ref 22.0–30.0)
CREATININE: 1.11 mg/dL (ref 0.76–1.27)
CREATININE: 0.99 mg/dL (ref 0.70–1.30)
EGFR CKD-EPI NON-AA MALE: 85 mL/min/{1.73_m2} (ref >=60)
GLOBULIN, TOTAL: 2.3 g/dL (ref 1.5–4.5)
GLUCOSE RANDOM: 129 mg/dL (ref 70–179)
GLUCOSE: 122 mg/dL — ABNORMAL HIGH (ref 65–99)
POTASSIUM: 4.7 mmol/L (ref 3.5–5.2)
POTASSIUM: 5.4 mmol/L — ABNORMAL HIGH (ref 3.5–5.0)
PROTEIN TOTAL: 7.3 g/dL (ref 6.5–8.3)
SODIUM: 139 mmol/L (ref 134–144)
SODIUM: 138 mmol/L (ref 135–145)
TOTAL PROTEIN: 7.2 g/dL (ref 6.0–8.5)

## 2018-11-29 LAB — CBC W/ AUTO DIFF
BASOPHILS RELATIVE PERCENT: 0.8 %
EOSINOPHILS ABSOLUTE COUNT: 0.1 10*9/L (ref 0.0–0.4)
EOSINOPHILS RELATIVE PERCENT: 0.9 %
HEMATOCRIT: 43 % (ref 41.0–53.0)
HEMOGLOBIN: 13.8 g/dL (ref 13.5–17.5)
LARGE UNSTAINED CELLS: 1 % (ref 0–4)
LYMPHOCYTES ABSOLUTE COUNT: 1.1 10*9/L — ABNORMAL LOW (ref 1.5–5.0)
LYMPHOCYTES RELATIVE PERCENT: 8.2 %
MEAN CORPUSCULAR HEMOGLOBIN CONC: 32.1 g/dL (ref 31.0–37.0)
MEAN CORPUSCULAR HEMOGLOBIN: 27.5 pg (ref 26.0–34.0)
MEAN CORPUSCULAR VOLUME: 85.7 fL (ref 80.0–100.0)
MONOCYTES ABSOLUTE COUNT: 0.6 10*9/L (ref 0.2–0.8)
MONOCYTES RELATIVE PERCENT: 4.7 %
NEUTROPHILS ABSOLUTE COUNT: 10.8 10*9/L — ABNORMAL HIGH (ref 2.0–7.5)
NEUTROPHILS RELATIVE PERCENT: 84.4 %
PLATELET COUNT: 297 10*9/L (ref 150–440)
RED BLOOD CELL COUNT: 5.02 10*12/L (ref 4.50–5.90)
RED CELL DISTRIBUTION WIDTH: 15.5 % — ABNORMAL HIGH (ref 12.0–15.0)
WBC ADJUSTED: 12.8 10*9/L — ABNORMAL HIGH (ref 4.5–11.0)

## 2018-11-29 LAB — GLUCOSE: Glucose:MCnc:Pt:Ser/Plas:Qn:: 122 — ABNORMAL HIGH

## 2018-11-29 LAB — BASOPHILS RELATIVE PERCENT: Basophils/100 leukocytes:NFr:Pt:Bld:Qn:Automated count: 1

## 2018-11-29 LAB — URINALYSIS WITH CULTURE REFLEX
BACTERIA: NONE SEEN /HPF
BILIRUBIN UA: NEGATIVE
GLUCOSE UA: NEGATIVE
KETONES UA: NEGATIVE
LEUKOCYTE ESTERASE UA: NEGATIVE
NITRITE UA: NEGATIVE
PH UA: 5 (ref 5.0–9.0)
PROTEIN UA: NEGATIVE
RBC UA: 8 /HPF — ABNORMAL HIGH (ref <=3)
SPECIFIC GRAVITY UA: 1.088 — ABNORMAL HIGH (ref 1.003–1.030)
SQUAMOUS EPITHELIAL: <1 /HPF (ref 0–5)
UROBILINOGEN UA: 0.2
WBC UA: <1 /HPF (ref <=2)

## 2018-11-29 LAB — LIPASE: Triacylglycerol lipase:CCnc:Pt:Ser/Plas:Qn:: 49

## 2018-11-29 LAB — TACROLIMUS, TROUGH: Lab: 33.1

## 2018-11-29 LAB — TROPONIN I
Troponin I.cardiac:MCnc:Pt:Ser/Plas:Qn:: 0.037
Troponin I.cardiac:MCnc:Pt:Ser/Plas:Qn:: 0.037
Troponin I.cardiac:MCnc:Pt:Ser/Plas:Qn:: 0.039

## 2018-11-29 LAB — KETONES UA: Ketones:MCnc:Pt:Urine:Qn:Test strip: NEGATIVE

## 2018-11-29 LAB — BILIRUBIN DIRECT
Bilirubin.glucuronidated+Bilirubin.albumin bound:MCnc:Pt:Ser/Plas:Qn:: 0.15
Bilirubin.glucuronidated+Bilirubin.albumin bound:MCnc:Pt:Ser/Plas:Qn:: 0.2

## 2018-11-29 LAB — ALKALINE PHOSPHATASE: Alkaline phosphatase:CCnc:Pt:Ser/Plas:Qn:: 98

## 2018-11-29 LAB — MAGNESIUM
Magnesium:MCnc:Pt:Ser/Plas:Qn:: 1.3 — ABNORMAL LOW
Magnesium:MCnc:Pt:Ser/Plas:Qn:: 1.6

## 2018-11-29 LAB — GAMMA GLUTAMYL TRANSFERASE
Gamma glutamyl transferase:CCnc:Pt:Ser/Plas:Qn:: 45
Gamma glutamyl transferase:CCnc:Pt:Ser/Plas:Qn:: 50

## 2018-11-29 LAB — PRO-BNP: Natriuretic peptide.B prohormone N-Terminal:MCnc:Pt:Ser/Plas:Qn:: 529 — ABNORMAL HIGH

## 2018-11-29 LAB — MONOCYTES RELATIVE PERCENT: Monocytes/100 leukocytes:NFr:Pt:Bld:Qn:Automated count: 4.7

## 2018-11-29 LAB — PHOSPHORUS: Phosphate:MCnc:Pt:Ser/Plas:Qn:: 4.2

## 2018-11-29 LAB — PHOSPHORUS, SERUM: Phosphate:MCnc:Pt:Ser/Plas:Qn:: 3.8

## 2018-11-29 NOTE — Unmapped (Signed)
Called pt to schedule appt with Dr. Celine Mans or Matilde Haymaker next week. Asked pt how he was doing and said he could be better, that he was in [facility] ED bc back pain moved to chest and he wanted to make sure it was nothing serious. Told him this TPA had called to schedule appt with either Dr. Celine Mans or Matilde Haymaker next week but could call back later, didn't want to interfere in what he was currently doing. Pt wanted to go ahead and he is scheduled to see Dr. Celine Mans 10/12 at 1:15. Wished him well and he verbalized understanding of all discussed.

## 2018-11-29 NOTE — Unmapped (Signed)
CC of CP and SOB beginning last night at rest.

## 2018-11-29 NOTE — Unmapped (Signed)
Emergency Department Provider Note      Impression, ED Course, Assessment and Plan      Impression:   58 year old male with past medical history as stated below including cryptogenic cirrhosis and hepatocellular carcinoma now status post OLT on 09/16/2018, aortic valve stenosis, type 2 diabetes, autoimmune cholangitis, pleural effusion and pneumothorax as a result of initial liver biopsy 09/07/2018 now status post removal of 2 chest tubes who presents to the emergency department with chest pain starting approximately 7 hours prior to arrival, center chest without radiation with associated shortness of breath that has been persistent and unchanged since liver transplantation    Differential diagnosis includes ACS, pneumonia, transplant rejection, less likely COVID-19.  Do not clinically suspect pulmonary embolism.  Lower suspicion of dissection given that back pain is been unchanged since recent surgery.  He has no evidence of cauda equina, including numbness, weakness, bowel or bladder incontinence, no recent epidural injections concerning for epidural abscess.  No CVA tenderness concerning for renal pathology.    Patient has a well score 1 for malignancy with treatment in the past 6 months.  He has had a recent transplant but this was approximately 2 months ago.  I do not suspect pulmonary embolism is most likely diagnosis.  He is not tachycardic or immobilized, no hemoptysis, leg swelling or recent travel.    Heart score 4 given risk factors, age, first troponin, although hemolyzed  Plan for repeat troponin.  EKG normal sinus rhythm without any signs of acute ischemia.     Given malignancy, recent surgery, mild tachypnea, back pain, will proceed with CTA dissection protocol    Also of note, patient's tacrolimus level noted to be elevated 33, this is a change significant from prior levels.     Anticipate admission pending CTA dissection protocol    3 PM, CT dissection protocol shows dilated ascending aorta 4 cm, no evidence of dissection.  Moderate right-sided pleural effusion similar to prior.  Will page for admission at this time    Anthony Alvarado was evaluated in Emergency Department at the time of this visit for the symptoms described in the history of present illness. He was evaluated in the context of the global COVID-19 pandemic, which necessitated consideration that the patient might be at risk for infection with the SARS-CoV-2 virus that causes COVID-19. Institutional protocols and algorithms that pertain to the evaluation of patients at risk for COVID-19 were followed during the patient's care in the ED.    I have reviewed the vital signs and the nursing notes. Labs and radiology results that were available during my care of the patient were independently reviewed by me and considered in my medical decision making.     Portions of this record have been created using Scientist, clinical (histocompatibility and immunogenetics). Dictation errors have been sought, but may not have been identified and corrected.  ____________________________________________         History        Chief Complaint  Chest Pain      HPI   Anthony Alvarado is a 58 y.o. male with cryptogenic cirrhosis and HCC s/p OLT 09/16/2018.  PMH additionally notable for severe aortic valve stenosis, HTN, DMII, OSA and Autoimmune cholangitis, carrier of hemochromatosis HFE gene mutation.      Post-operative course was relatively uncomplicated but he has been followed in clinic with persistent ascites and recent ED evaluation 8/19 with pulmonary effusion fever and PNA. He completed a course of levaquin with improvement of symptoms and resolution of  symptoms.  ??  Continues to take tacrolimus, Myfortic.    Of note, it appears the patient initially had a liver biopsy on 09/07/2018 by interventional radiology.  Subsequent day, he was found to have new large right-sided middle and lower lobe collapse suspected to be secondary to perforation of the diaphragm during the biopsy.  He had a chest tube placed on 7/17 with interventional radiology with subsequent development of large pneumothorax and right lobar collapse requiring second apical chest tube placed on 09/10/2018.  Both chest tubes were removed on 09/14/2018 without recurrence of pneumothorax    Presents today with center of chest pressure like nature chest pain without radiation, worse with laying flat.  He also endorses shortness of breath that remains unchanged.  States that he has been mildly short of breath with exertion since initial pleural effusion that developed after biopsy on 09/07/2018.  Denies any fevers, chills, nausea, vomiting.  Continues have normal bowel movements.  No known sick contacts or COVID exposure.  Has tried muscle relaxers without resolution of his chest pain.  Also of note, patient states he has had back pain since biopsy that radiates to the front of his abdomen.  States this is unchanged.     No pertinent travel history including travel to mainland [place], or close contact with lab confirmed case of novel 2019 coronavirus infection within 14 days of symptom onset.      Past Medical History:   Diagnosis Date   ??? Aortic valve stenosis    ??? Autoimmune cholangitis    ??? 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 Cholestatic cirrhosis (CMS-HCC)    ??? Hepatic encephalopathy (CMS-HCC)    ??? Hypertension    ??? Sleep apnea    ??? Type 2 diabetes mellitus (CMS-HCC)        Patient Active Problem List   Diagnosis   ??? Diabetes mellitus without complication (CMS-HCC)   ??? Hypothyroidism   ??? Cirrhosis of liver without ascites (CMS-HCC)   ??? OSA (obstructive sleep apnea)   ??? Essential hypertension   ??? Encounter for pre-transplant evaluation for chronic liver disease   ??? Pleural effusion   ??? Pneumothorax after biopsy   ??? Liver transplant recipient (CMS-HCC)   ??? Allergic rhinitis   ??? Arthralgia   ??? Asthma   ??? Bicuspid aortic valve   ??? Flying phobia   ??? Mucosal abnormality of stomach   ??? Nephrolithiasis   ??? Pure hypercholesterolemia   ??? Umbilical hernia   ??? Unspecified hearing loss       Past Surgical History:   Procedure Laterality Date   ??? APPENDECTOMY     ??? CHG US GUIDE, TISSUE ABLATION N/A 05/03/2018    Procedure: ULTRASOUND GUIDANCE FOR, AND MONITORING OF, PARENCHYMAL TISSUE ABLATION;  Surgeon: Particia Nearing, MD;  Location: MAIN OR [facility];  Service: Transplant   ??? PR CATH PLACE/CORON ANGIO, IMG SUPER/INTERP,R&L HRT CATH, L HRT VENTRIC N/A 03/21/2018    Procedure: Left/Right Heart Catheterization;  Surgeon: Neal Dy, MD;  Location: [facility] CATH;  Service: Cardiology   ??? PR LAP,ABLAT 1+ LIVER TUMOR(S),RADIOFREQ N/A 05/03/2018    Procedure: LAPAROSCOPY, SURGICAL, ABLATION OF 1 OR MORE LIVER TUMOR(S); RADIOFREQUENCY;  Surgeon: Particia Nearing, MD;  Location: MAIN OR [facility];  Service: Transplant   ??? PR TRANSPLANT LIVER,ALLOTRANSPLANT Bilateral 09/16/2018    Procedure: LIVER ALLOTRANSPLANTATION; ORTHOTOPIC, PARTIAL OR WHOLE, FROM CADAVER OR LIVING DONOR, ANY AGE;  Surgeon: Florene Glen, MD;  Location: MAIN OR [facility];  Service: Transplant   ??? PR TRANSPLANT,PREP DONOR LIVER, WHOLE N/A 09/16/2018    Procedure: [facility] STD PREP CAD DONOR WHOLE LIVER GFT PRIOR TNSPLNT,INC CHOLE,DISS/REM SURR TISSU WO TRISEG/LOBE SPLT;  Surgeon: Florene Glen, MD;  Location: MAIN OR [place];  Service: Transplant       Allergies  Watermelon flavor    Family History   Problem Relation Age of Onset   ??? Asthma Mother    ??? COPD Father    ??? Cancer Father    ??? Autoimmune disease Sister         AIH   ??? Heart disease Sister    ??? Colon cancer Maternal Grandfather        Social History  Social History     Tobacco Use   ??? Smoking status: Never Smoker   ??? Smokeless tobacco: Never Used   Substance Use Topics   ??? Alcohol use: Not Currently   ??? Drug use: Never       Review of Systems  Constitutional: Negative for fever.  Eyes: Negative for visual changes.  ENT: Negative for sore throat.  Cardiovascular: Positive for chest pain.  Respiratory: Positive for shortness of breath.  No cough.  Gastrointestinal: Negative for abdominal pain, vomiting or diarrhea.  Genitourinary: Negative for dysuria.   Musculoskeletal: Positive for back pain.  Skin: Negative for rash.  Neurological: Negative for headaches, focal weakness or numbness.          Physical Exam     This provider entered the patient's room: Yes:    ? If this provider did not enter the room, a comprehensive physical exam was not able to be performed due to increased infection risk to themselves, other providers, staff and other patients), as well as to conserve personal protective equipment (PPE) utilization during the COVID-19 pandemic.    ? If this provider did enter the patient room, the following was PPE worn: CAPR with face shield, gown and gloves    Vitals:    11/29/18 1100 11/29/18 1200 11/29/18 1300 11/29/18 1400   BP: 122/90 116/78 119/88 138/93   Pulse: 84 79 80 84   Resp: 27 21 24 27    Temp:       TempSrc:       SpO2: 98% 96% 99% 98%        Constitutional: Alert and oriented. Well appearing and in no distress.  Eyes: Conjunctivae are normal.  ENT       Head: Normocephalic and atraumatic.       Nose: No rhinorrhea.       Mouth/Throat: Mucous membranes appear moist.       Neck: No audible stridor.  Cardiovascular: Blood pressure and pulse rate as documented above.  Respiratory: Normal respiratory pattern and effort.  Coarse breath sounds bilaterally. Speaking easily in full sentences. No cough noted during my evaluation of the patient.  Gastrointestinal: Nontender to palpation, negative Murphy sign, no rebound or guarding.  No CVA tenderness.  Musculoskeletal: Moving all extremities normally.  Neurologic: Normal speech and language. No gross focal neurologic deficits are appreciated.  Skin: Skin appears warm and dry. No rash visible.  No leg swelling or edema bilaterally  Psychiatric: Mood and affect are normal. Speech and behavior are normal.    Labs Reviewed   TACROLIMUS LEVEL, TROUGH - Abnormal; Notable for the following components:       Result Value    Tacrolimus, Trough 33.1 (*)     All  other components within normal limits    Narrative:     Reference ranges vary with organ type, time since transplant, and patient status. Contact the laboratory, pharmacy, or transplant coordinator for more information. This test was performed using liquid chromatography tandem mass spectrometry. This methodology was developed and its performance characteristics determined by the [facility] of the [employer], [employer]. This test has not been cleared or approved by the FDA. The laboratory is regulated under CAP and CLIA as qualified to perform high-complexity testing. This test is to be used for clinical purposes and should not be regarded as investigational or for research. Results should be interpreted in context with other laboratory and clinical data.     MAGNESIUM - Abnormal; Notable for the following components:    Magnesium 1.3 (*)     All other components within normal limits   COMPREHENSIVE METABOLIC PANEL - Abnormal; Notable for the following components:    Potassium 5.4 (*)     BUN 24 (*)     Calcium 10.6 (*)     All other components within normal limits   TROPONIN I - Abnormal; Notable for the following components:    Troponin I 0.037 (*)     All other components within normal limits   PRO-BNP - Abnormal; Notable for the following components:    PRO-BNP 529.0 (*)     All other components within normal limits   TROPONIN I - Abnormal; Notable for the following components:    Troponin I 0.037 (*)     All other components within normal limits   TROPONIN I - Abnormal; Notable for the following components:    Troponin I 0.039 (*)     All other components within normal limits   CBC W/ AUTO DIFF - Abnormal; Notable for the following components:    WBC 12.8 (*)     RDW 15.5 (*)     Neutrophil Left Shift 2+ (*)     Absolute Neutrophils 10.8 (*)     Absolute Lymphocytes 1.1 (*)     Hypochromasia Slight (*)     All other components within normal limits    Narrative:     Please use the Absolute Differential for reference ranges.    COVID-19 PCR - Normal    Narrative:     This test was performed using the Cepheid Xpert Xpress SARS-CoV-2 assay which has been validated by the CLIA-certified, CAP-inspected [facility]. FDA has granted Emergency Use Authorization for this test. This real-time RT-PCR test detects SARS-CoV-2 by targeting the N2 and E genes. Negative results do not preclude SARS-CoV-2 infection and should not be used as the sole basis for patient management decisions. Negative results must be combined with clinical observations, patient history, and epidemiological information. Information for providers and patients can be found here: [email or web address]     GAMMA GT - Normal   PHOSPHORUS - Normal   BILIRUBIN, DIRECT - Normal   LIPASE - Normal   CBC W/ DIFFERENTIAL    Narrative:     The following orders were created for panel order CBC w/ Differential.  Procedure                               Abnormality         Status                     ---------                               -----------         ------  CBC w/ Differential[[id number]]         Abnormal            Final result                 Please view results for these tests on the individual orders.   EXTRA TUBES    Narrative:     The following orders were created for panel order ED Extra Tubes.  Procedure                               Abnormality         Status                     ---------                               -----------         ------                     LIGHT BLUE CITRATE EXTR.Marland KitchenMarland Kitchen[[id number]]                      Final result                 Please view results for these tests on the individual orders.   URINALYSIS WITH CULTURE REFLEX   LIGHT BLUE CITRATE EXTRA TUBE    Narrative:     Collected and received in lab.        CTA Chest/Abd (Aortic Dissection)   Final Result   -Dilated ascending thoracic aorta measuring 4 cm. No evidence of aortic dissection or other acute aortic syndromes.      -Moderate right pleural effusion with adjacent passive atelectasis, similar to prior.      -Stable perihepatic fluid collections.                XR Chest 1 view Portable   Final Result      Suspect mild right lower lobe atelectasis.             Ellard Artis, MD  Resident  11/29/18 [phone]

## 2018-11-29 NOTE — Unmapped (Addendum)
58 year old male with past medical history including cryptogenic cirrhosis and hepatocellular carcinoma now status post OLT on 09/16/2018, aortic valve stenosis, type 2 diabetes, autoimmune cholangitis, pleural effusion and pneumothorax as a result of initial liver biopsy 09/07/2018 now status post removal of 2 chest tubes who presented to the emergency department with chest pain on 10/6.     Initial workup, including CTA, EKG, UA were unremarkable. Troponin levels peaked at 0.039 and downtrended. MRI obtained for [facility] surveillance which showed stable posterior hepatic fluid collection (post-op hematoma unchanged in size) and loculated ascites along the falciform ligament, no enhancement to suggest abscess. No evidence of HCC.     VIR was consulted on 10/7 regarding fluid collections and patient was taken for percutaneous aspiration on 10/8. The aspirate was sent for culture and patient was empirically given two doses of Zosyn prior to discharge, plan to follow up with culture results.     Orthopaedics was consulted on 10/8 regarding patient's new onset back pain. Plain films were negative for lytic lesions or fracture. He was advised to follow up with physical therapy outpatient.

## 2018-11-29 NOTE — Unmapped (Signed)
Pt called to report that the back pain he has been having has now moved up higher and forward to his chest.  He first noticed this pain at 2AM.  He has pain with normal breaths although no SOB.  At one point he felt light-headed this morniing.   He felt that he needed to be evaluated today.  We discussed options of coming to ED vs. Waiting for outpatient evaluation plan.  He was leaning to the former.  I instructed him to check his temperature and if any fever to go to ED now.

## 2018-11-30 DIAGNOSIS — C220 Liver cell carcinoma: Secondary | ICD-10-CM

## 2018-11-30 DIAGNOSIS — R188 Other ascites: Secondary | ICD-10-CM

## 2018-11-30 DIAGNOSIS — E119 Type 2 diabetes mellitus without complications: Secondary | ICD-10-CM

## 2018-11-30 DIAGNOSIS — I10 Essential (primary) hypertension: Secondary | ICD-10-CM

## 2018-11-30 DIAGNOSIS — K8309 Other cholangitis: Secondary | ICD-10-CM

## 2018-11-30 DIAGNOSIS — Z20828 Contact with and (suspected) exposure to other viral communicable diseases: Secondary | ICD-10-CM

## 2018-11-30 DIAGNOSIS — K7460 Unspecified cirrhosis of liver: Secondary | ICD-10-CM

## 2018-11-30 DIAGNOSIS — M47816 Spondylosis without myelopathy or radiculopathy, lumbar region: Secondary | ICD-10-CM | POA: Diagnosis not present

## 2018-11-30 LAB — CBC W/ AUTO DIFF
BASOPHILS ABSOLUTE COUNT: 0.1 10*9/L (ref 0.0–0.1)
EOSINOPHILS ABSOLUTE COUNT: 0.3 10*9/L (ref 0.0–0.4)
HEMATOCRIT: 41.8 % (ref 41.0–53.0)
HEMOGLOBIN: 13.2 g/dL — ABNORMAL LOW (ref 13.5–17.5)
LYMPHOCYTES ABSOLUTE COUNT: 1.6 10*9/L (ref 1.5–5.0)
LYMPHOCYTES RELATIVE PERCENT: 14.1 %
MEAN CORPUSCULAR HEMOGLOBIN CONC: 31.6 g/dL (ref 31.0–37.0)
MEAN CORPUSCULAR HEMOGLOBIN: 27.4 pg (ref 26.0–34.0)
MEAN CORPUSCULAR VOLUME: 86.8 fL (ref 80.0–100.0)
MEAN PLATELET VOLUME: 8 fL (ref 7.0–10.0)
MONOCYTES ABSOLUTE COUNT: 1 10*9/L — ABNORMAL HIGH (ref 0.2–0.8)
MONOCYTES RELATIVE PERCENT: 8.5 %
NEUTROPHILS ABSOLUTE COUNT: 8.1 10*9/L — ABNORMAL HIGH (ref 2.0–7.5)
NEUTROPHILS RELATIVE PERCENT: 71.9 %
PLATELET COUNT: 257 10*9/L (ref 150–440)
RED BLOOD CELL COUNT: 4.82 10*12/L (ref 4.50–5.90)
RED CELL DISTRIBUTION WIDTH: 15.3 % — ABNORMAL HIGH (ref 12.0–15.0)
WBC ADJUSTED: 11.3 10*9/L — ABNORMAL HIGH (ref 4.5–11.0)

## 2018-11-30 LAB — GLUCOSE RANDOM: Glucose:MCnc:Pt:Ser/Plas:Qn:: 100

## 2018-11-30 LAB — BASIC METABOLIC PANEL
ANION GAP: 14 mmol/L (ref 7–15)
BUN / CREAT RATIO: 23
CALCIUM: 9.5 mg/dL (ref 8.5–10.2)
CO2: 23 mmol/L (ref 22.0–30.0)
CREATININE: 1.08 mg/dL (ref 0.70–1.30)
EGFR CKD-EPI AA MALE: 88 mL/min/{1.73_m2} (ref >=60)
EGFR CKD-EPI NON-AA MALE: 76 mL/min/{1.73_m2} (ref >=60)
GLUCOSE RANDOM: 100 mg/dL (ref 70–179)
POTASSIUM: 5.1 mmol/L — ABNORMAL HIGH (ref 3.5–5.0)

## 2018-11-30 LAB — BILIRUBIN TOTAL: Bilirubin:MCnc:Pt:Ser/Plas:Qn:: 0.8

## 2018-11-30 LAB — HEPATIC FUNCTION PANEL
ALBUMIN: 4.2 g/dL (ref 3.5–5.0)
ALKALINE PHOSPHATASE: 95 U/L (ref 38–126)
ALT (SGPT): 19 U/L (ref <50)
BILIRUBIN DIRECT: 0.2 mg/dL (ref 0.00–0.40)
PROTEIN TOTAL: 6.8 g/dL (ref 6.5–8.3)

## 2018-11-30 LAB — TACROLIMUS BLOOD: Tacrolimus:MCnc:Pt:Bld:Qn:LC/MS/MS: 9.3

## 2018-11-30 LAB — MAGNESIUM: Magnesium:MCnc:Pt:Ser/Plas:Qn:: 2.1

## 2018-11-30 LAB — SMEAR REVIEW

## 2018-11-30 LAB — HYPOCHROMIA

## 2018-11-30 LAB — TACROLIMUS, TROUGH: Lab: 9.5

## 2018-11-30 LAB — PHOSPHORUS: Phosphate:MCnc:Pt:Ser/Plas:Qn:: 4.5

## 2018-11-30 NOTE — Unmapped (Signed)
Please see same day consult note by Trilby Leaver, as this will serve as H&P.

## 2018-11-30 NOTE — Unmapped (Signed)
Tacrolimus Therapeutic Monitoring Pharmacy Note    Anthony Alvarado is a 58 y.o. male continuing tacrolimus.     Indication: Liver transplant     Date of Transplant: 09/16/18      Prior Dosing Information: Current regimen 2.5 mg BID      Goals:  Therapeutic Drug Levels  Tacrolimus trough goal: 8-10 ng/mL    Additional Clinical Monitoring/Outcomes  ?? Monitor renal function (SCr and urine output) and liver function (LFTs)  ?? Monitor for signs/symptoms of adverse events (e.g., hyperglycemia, hyperkalemia, hypomagnesemia, hypertension, headache, tremor)    Results:   Tacrolimus level: 33.1 ng/mL, drawn after dose and not a reflection of a true trough    Pharmacokinetic Considerations and Significant Drug Interactions:  ? Concurrent hepatotoxic medications: None identified  ? Concurrent CYP3A4 substrates/inhibitors: None identified  ? Concurrent nephrotoxic medications: None identified    Assessment/Plan:  Recommendedation(s)  ? Continue current regimen of 2.5 mg BID    Follow-up  ? Next level to be determined by primary team.   ? A pharmacist will continue to monitor and recommend levels as appropriate    Please page service pharmacist with questions/clarifications.    Meridee Score, PharmD

## 2018-11-30 NOTE — Unmapped (Signed)
TRANSPLANT SURGERY  NOTE    Assessment and Plan  58 year old male with past medical history as stated below including cryptogenic cirrhosis and hepatocellular carcinoma now status post OLT on 09/16/2018, aortic valve stenosis, type 2 diabetes, autoimmune cholangitis, pleural effusion and pneumothorax as a result of initial liver biopsy 09/07/2018 now status post removal of 2 chest tubes who presents to the emergency department with chest pain starting approximately 7 hours prior to arrival, center chest without radiation with associated shortness of breath that has been persistent and unchanged since liver transplantation Troponin was 0.03 and EKG was normal, CTA Chest was normal. CT Abdomen was s/o consistent previous abdominal collections. Labs are stable.    - Admit under SRF  - MRI Abdomen Pelvis for previous abdominal collections, HCC surveillance     Subjective  58 year old male with past medical history as stated below including cryptogenic cirrhosis and hepatocellular carcinoma now status post OLT on 09/16/2018, aortic valve stenosis, type 2 diabetes, autoimmune cholangitis, pleural effusion and pneumothorax as a result of initial liver biopsy 09/07/2018 now status post removal of 2 chest tubes who presents to the emergency department with chest pain starting approximately 7 hours prior to arrival, center chest without radiation with associated shortness of breath that has been persistent and unchanged since liver transplantation      Objective    Vitals:    11/29/18 1400 11/29/18 1500 11/29/18 1600 11/29/18 1700   BP: 138/93 125/89 134/89 142/94   Pulse: 84 86 91 92   Resp: 27 28 27 27    Temp:       TempSrc:       SpO2: 98% 98% 97% 98%      There is no height or weight on file to calculate BMI.     Physical Exam:    General Appearance:   No acute distress  Lungs:                Clear to auscultation bilaterally, mild costochondral junction tenderness  Heart:                           Regular rate and rhythm Abdomen:                Soft, non-tender, non-distended, healthy and healing scar in upper abdomen  Extremities:              Warm and well perfused        Data Review:  All lab results last 24 hours:    Recent Results (from the past 24 hour(s))   Tacrolimus Level, Trough    Collection Time: 11/29/18  9:53 AM   Result Value Ref Range    Tacrolimus, Trough 33.1 (HH) 5.0 - 15.0 ng/mL   Gamma GT (GGT)    Collection Time: 11/29/18  9:53 AM   Result Value Ref Range    GGT 50 12 - 109 U/L   Magnesium Level    Collection Time: 11/29/18  9:53 AM   Result Value Ref Range    Magnesium 1.3 (L) 1.6 - 2.2 mg/dL   Phosphorus Level    Collection Time: 11/29/18  9:53 AM   Result Value Ref Range    Phosphorus 4.2 2.9 - 4.7 mg/dL   Bilirubin, Direct    Collection Time: 11/29/18  9:53 AM   Result Value Ref Range    Bilirubin, Direct 0.20 0.00 - 0.40 mg/dL   Comprehensive metabolic panel  Collection Time: 11/29/18  9:53 AM   Result Value Ref Range    Sodium 138 135 - 145 mmol/L    Potassium 5.4 (H) 3.5 - 5.0 mmol/L    Chloride 102 98 - 107 mmol/L    Anion Gap 13 7 - 15 mmol/L    CO2 23.0 22.0 - 30.0 mmol/L    BUN 24 (H) 7 - 21 mg/dL    Creatinine 4.54 0.98 - 1.30 mg/dL    BUN/Creatinine Ratio 24     EGFR CKD-EPI Non-African American, Male 44 >=60 mL/min/1.16m2    EGFR CKD-EPI African American, Male >90 >=60 mL/min/1.72m2    Glucose 129 70 - 179 mg/dL    Calcium 11.9 (H) 8.5 - 10.2 mg/dL    Albumin 4.9 3.5 - 5.0 g/dL    Total Protein 7.3 6.5 - 8.3 g/dL    Total Bilirubin 0.7 0.0 - 1.2 mg/dL    AST 29 19 - 55 U/L    ALT 21 <50 U/L    Alkaline Phosphatase 98 38 - 126 U/L   Troponin I    Collection Time: 11/29/18  9:53 AM   Result Value Ref Range    Troponin I 0.037 (HH) <0.034 ng/mL   Lipase    Collection Time: 11/29/18  9:53 AM   Result Value Ref Range    Lipase 49 44 - 232 U/L   COVID-19 PCR    Collection Time: 11/29/18  9:53 AM    Specimen: Nasopharyngeal Swab   Result Value Ref Range    SARS-CoV-2 PCR Negative Negative   CBC w/ Differential    Collection Time: 11/29/18  9:53 AM   Result Value Ref Range    Results Verified by Slide Scan Slide Reviewed     WBC 12.8 (H) 4.5 - 11.0 10*9/L    RBC 5.02 4.50 - 5.90 10*12/L    HGB 13.8 13.5 - 17.5 g/dL    HCT 14.7 82.9 - 56.2 %    MCV 85.7 80.0 - 100.0 fL    MCH 27.5 26.0 - 34.0 pg    MCHC 32.1 31.0 - 37.0 g/dL    RDW 13.0 (H) 86.5 - 15.0 %    MPV 7.8 7.0 - 10.0 fL    Platelet 297 150 - 440 10*9/L    Neutrophils % 84.4 %    Lymphocytes % 8.2 %    Monocytes % 4.7 %    Eosinophils % 0.9 %    Basophils % 0.8 %    Neutrophil Left Shift 2+ (A) Not Present    Absolute Neutrophils 10.8 (H) 2.0 - 7.5 10*9/L    Absolute Lymphocytes 1.1 (L) 1.5 - 5.0 10*9/L    Absolute Monocytes 0.6 0.2 - 0.8 10*9/L    Absolute Eosinophils 0.1 0.0 - 0.4 10*9/L    Absolute Basophils 0.1 0.0 - 0.1 10*9/L    Large Unstained Cells 1 0 - 4 %    Hypochromasia Slight (A) Not Present   Pro-BNP    Collection Time: 11/29/18  9:59 AM   Result Value Ref Range    PRO-BNP 529.0 (H) 0.0 - 177.0 pg/mL   Troponin I    Collection Time: 11/29/18  9:59 AM   Result Value Ref Range    Troponin I 0.039 (HH) <0.034 ng/mL   Troponin I    Collection Time: 11/29/18  1:16 PM   Result Value Ref Range    Troponin I 0.037 (HH) <0.034 ng/mL         Imaging:

## 2018-11-30 NOTE — Unmapped (Signed)
CSW notified of admission and same day discharge following unrevealing w/u for chest pain.     Patient received a liver transplant on 09/16/18 and his post transplant course has been quite smooth. He has been closed of HH labs and PT.     TNC aware to contact CSW if outpatient needs arise and appointment is needed.     Carole Binning, LCSW-A  Transplant Case Manager  11/30/2018. 3:18 PM

## 2018-11-30 NOTE — Unmapped (Signed)
VSS, pt alert and oriented throughout shift. Admitted from ED this shift. Pt c/o chest pain, PRN pain medications given. Pt asleep throughout night. Telemetry in use. Will continue to monitor.     Problem: Adult Inpatient Plan of Care  Goal: Plan of Care Review  Outcome: Ongoing - Unchanged  Goal: Patient-Specific Goal (Individualization)  Outcome: Ongoing - Unchanged  Flowsheets (Taken 11/30/2018 0357)  Patient-Specific Goals (Include Timeframe): Pt will maintain tolerable pain level throughout shift  Goal: Absence of Hospital-Acquired Illness or Injury  Outcome: Ongoing - Unchanged  Goal: Optimal Comfort and Wellbeing  Outcome: Ongoing - Unchanged  Goal: Readiness for Transition of Care  Outcome: Ongoing - Unchanged  Goal: Rounds/Family Conference  Outcome: Ongoing - Unchanged     Problem: Diabetes Comorbidity  Goal: Blood Glucose Level Within Desired Range  Outcome: Ongoing - Unchanged     Problem: Hypertension Comorbidity  Goal: Blood Pressure in Desired Range  Outcome: Ongoing - Unchanged     Problem: Wound  Goal: Optimal Wound Healing  Outcome: Ongoing - Unchanged

## 2018-11-30 NOTE — Unmapped (Signed)
VASCULAR INTERVENTIONAL RADIOLOGY  Drainage/Aspiration Consultation     Requesting Attending Physician: Phillips Grout Des*  Service Requesting Consult: Surg Transplant [facility])    Date of Service: 11/30/2018  Consulting Interventional Radiologist: Dr. Braulio Conte     HPI:     Procedure Requested: Aspiration  of anterior abdominal fluid collection    Anthony Alvarado is a 58 y.o. male with past medical history including cryptogenic cirrhosis and hepatocellular carcinoma now status post liver transplant. Found to have multiple abdominal fluid collections. Team requesting aspiration of anterior abdominal fluid collection      Medical History:     Past Medical History:  Past Medical History:   Diagnosis Date   ??? Aortic valve stenosis    ??? Autoimmune cholangitis    ??? 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 Cholestatic cirrhosis (CMS-HCC)    ??? Hepatic encephalopathy (CMS-HCC)    ??? Hypertension    ??? Sleep apnea    ??? Type 2 diabetes mellitus (CMS-HCC)        Surgical History:  Past Surgical History:   Procedure Laterality Date   ??? APPENDECTOMY     ??? CHG US GUIDE, TISSUE ABLATION N/A 05/03/2018    Procedure: ULTRASOUND GUIDANCE FOR, AND MONITORING OF, PARENCHYMAL TISSUE ABLATION;  Surgeon: Particia Nearing, MD;  Location: [facility]IN OR [facility];  Service: Transplant   ??? PR CATH PLACE/CORON ANGIO, IMG SUPER/INTERP,R&L HRT CATH, L HRT VENTRIC N/A 03/21/2018    Procedure: Left/Right Heart Catheterization;  Surgeon: Neal Dy, MD;  Location: [facility] CATH;  Service: Cardiology   ??? PR LAP,ABLAT 1+ LIVER TUMOR(S),RADIOFREQ N/A 05/03/2018    Procedure: LAPAROSCOPY, SURGICAL, ABLATION OF 1 OR MORE LIVER TUMOR(S); RADIOFREQUENCY;  Surgeon: Particia Nearing, MD;  Location: [facility];  Service: Transplant   ??? PR TRANSPLANT LIVER,ALLOTRANSPLANT Bilateral 09/16/2018    Procedure: LIVER ALLOTRANSPLANTATION; ORTHOTOPIC, PARTIAL OR WHOLE, FROM CADAVER OR LIVING DONOR, ANY AGE;  Surgeon: Florene Glen, MD; Location: [facility];  Service: Transplant   ??? PR TRANSPLANT,PREP DONOR LIVER, WHOLE N/A 09/16/2018    Procedure: [facility] STD PREP CAD DONOR WHOLE LIVER GFT PRIOR TNSPLNT,INC CHOLE,DISS/REM SURR TISSU WO TRISEG/LOBE SPLT;  Surgeon: Florene Glen, MD;  Location: MAIN OR [place];  Service: Transplant       Family History:  Family History   Problem Relation Age of Onset   ??? Asthma Mother    ??? COPD Father    ??? Cancer Father    ??? Autoimmune disease Sister         AIH   ??? Heart disease Sister    ??? Colon cancer Maternal Grandfather        Medications:   Current Facility-Administered Medications   Medication Dose Route Frequency Provider Last Rate Last Dose   ??? acetaminophen (TYLENOL) tablet 1,000 mg  1,000 mg Oral Q8H PRN Dorina Hoyer, MD       ??? amLODIPine (NORVASC) tablet 10 mg  10 mg Oral Daily Dorina Hoyer, MD   10 mg at 11/30/18 0910   ??? aspirin chewable tablet 324 mg  324 mg Oral Once Ellard Artis, MD   Stopped at 11/29/18 1118   ??? aspirin chewable tablet 81 mg  81 mg Oral Daily Dorina Hoyer, MD   81 mg at 11/30/18 0910   ??? azelastine (ASTELIN) 137 mcg (0.1 %) nasal spray 1 spray  1 spray Each Nare Daily PRN Dorina Hoyer, MD       ???  calcium carbonate (TUMS) chewable tablet 200 mg elem calcium  200 mg elem calcium Oral Daily PRN Dorina Hoyer, MD       ??? cholecalciferol (vitamin D3) tablet 2,000 Units  2,000 Units Oral Daily Dorina Hoyer, MD   2,000 Units at 11/30/18 0909   ??? cyclobenzaprine (FLEXERIL) tablet 5 mg  5 mg Oral TID PRN Dorina Hoyer, MD       ??? dextrose 50 % in water (D50W) 50 % solution 12.5 g  12.5 g Intravenous Q10 Min PRN Dorina Hoyer, MD       ??? ergocalciferol (DRISDOL) capsule 50,000 Units  50,000 Units Oral Weekly Dorina Hoyer, MD   Stopped at 11/29/18 1950   ??? fluticasone propionate (FLONASE) 50 mcg/actuation nasal spray 1 spray  1 spray Each Nare Daily PRN Dorina Hoyer, MD       ??? heparin (porcine) injection 5,000 Units  5,000 Units Subcutaneous [facility] Dorina Hoyer, MD 5,000 Units at 11/30/18 0615   ??? HYDROmorphone (PF) (DILAUDID) injection 0.5 mg  0.5 mg Intravenous Q4H PRN Dorina Hoyer, MD       ??? insulin lispro (HumaLOG) injection 0-12 Units  0-12 Units Subcutaneous ACHS Dorina Hoyer, MD       ??? levothyroxine (SYNTHROID) tablet 137 mcg  137 mcg Oral Daily Dorina Hoyer, MD       ??? mycophenolate (MYFORTIC) EC tablet 360 mg  360 mg Oral BID Dorina Hoyer, MD   360 mg at 11/30/18 0914   ??? ondansetron (ZOFRAN) injection 4 mg  4 mg Intravenous Q6H PRN Dorina Hoyer, MD       ??? oxyCODONE (ROXICODONE) immediate release tablet 5 mg  5 mg Oral Q4H PRN Dorina Hoyer, MD   5 mg at 11/29/18 2035    Or   ??? oxyCODONE (ROXICODONE) immediate release tablet 10 mg  10 mg Oral Q4H PRN Dorina Hoyer, MD       ??? pantoprazole (PROTONIX) EC tablet 40 mg  40 mg Oral Daily Dorina Hoyer, MD       ??? sertraline (ZOLOFT) tablet 25 mg  25 mg Oral Daily Dorina Hoyer, MD   25 mg at 11/30/18 0910   ??? sulfamethoxazole-trimethoprim (BACTRIM) 400-80 mg tablet 80 mg of trimethoprim  1 tablet Oral 3x weekly Dorina Hoyer, MD   80 mg of trimethoprim at 11/30/18 0914   ??? tacrolimus (PROGRAF) capsule 2.5 mg  2.5 mg Oral BID Dorina Hoyer, MD   2.5 mg at 11/30/18 0911   ??? tiZANidine (ZANAFLEX) tablet 2 mg  2 mg Oral TID Dorina Hoyer, MD   2 mg at 11/30/18 0908   ??? valGANciclovir (VALCYTE) tablet 450 mg  450 mg Oral Daily Dorina Hoyer, MD   450 mg at 11/30/18 0912     Facility-Administered Medications Ordered in Other Encounters   Medication Dose Route Frequency Provider Last Rate Last Dose   ??? diazePAM (VALIUM) 5 mg/mL injection                Allergies:  Watermelon flavor    Social History:  Social History     Tobacco Use   ??? Smoking status: Never Smoker   ??? Smokeless tobacco: Never Used   Substance Use Topics   ??? Alcohol use: Not Currently   ??? Drug use: Never       Objective:    Pertinent Laboratory Values:  WBC   Date Value Ref Range Status   11/30/2018 11.3 (H) 4.5 - 11.0 10*9/L Final   11/28/2018  9.0 3.4 - 10.8 x10E3/uL Final     HGB   Date Value Ref Range Status   11/30/2018 13.2 (L) 13.5 - 17.5 g/dL Final   16/11/9602 54.0 13.0 - 17.7 g/dL Final     Hemoglobin   Date Value Ref Range Status   09/17/2018 7.1 (L) 13.5 - 17.5 g/dL Final     Comment:     Point of Care Testing performed at the point of care by trained personnel per documented policies.     HCT   Date Value Ref Range Status   11/30/2018 41.8 41.0 - 53.0 % Final   11/28/2018 43.6 37.5 - 51.0 % Final     Platelet   Date Value Ref Range Status   11/30/2018 257 150 - 440 10*9/L Final   11/28/2018 325 150 - 450 x10E3/uL Final     INR   Date Value Ref Range Status   10/12/2018 1.19  Final   06/20/2018 1.7 (H) 0.8 - 1.2 Final     Comment:     Reference interval is for non-anticoagulated patients.  Suggested INR therapeutic range for Vitamin K  antagonist therapy:     Standard Dose (moderate intensity                    therapeutic range):       2.0 - 3.0     Higher intensity therapeutic range       2.5 - 3.5       Creatinine   Date Value Ref Range Status   11/30/2018 1.08 0.70 - 1.30 mg/dL Final   98/12/9145 8.29 0.76 - 1.27 mg/dL Final       Imaging Reviewed:  CTA, MRI abdomen 11/29/2018    Febrile:  No    Anticoaguation: No      Physical Exam:    Vitals:    11/30/18 0833   BP: 141/96   Pulse: 84   Resp: 16   Temp: 37.2 ??C (99 ??F)   SpO2: 98%     ASA Grade: ASA 3 - Patient with moderate systemic disease with functional limitations    Airway assessment: to be assessed upon arrival to VIR    Assessment and Recommendations:     Anthony Alvarado is a 58 y.o. male with past medical history including cryptogenic cirrhosis and hepatocellular carcinoma now status post liver transplant. Found to have multiple abdominal fluid collections. Team requesting aspiration of anterior abdominal fluid collection        Recommendations:  - VIR does recommend proceeding with Aspiration  of anterior abdominal fluid collection.  The procedure will be performed with Ultrasound guidance.    - Anticipated procedure date: tomorrow  - Please make NPO night prior to procedure  - Please ensure recent CBC, Creatinine, and INR are available    Informed Consent:  This procedure has been fully reviewed with the patient/patient???s authorized representative. The risks, benefits and alternatives have been explained, and the patient/patient???s authorized representative has consented to the procedure.  --The patient will accept blood products in an emergent situation.  --The patient does not have a Do Not Resuscitate order in effect.    The patient was discussed with Dr. Braulio Conte.     Thank you for involving [place] in the care of this patient. Please page the VIR consult pager [phone]) with further questions, concerns, or if new issues arise.  .  THIS CONSULT WAS COMPLETED VIRTUALLY IN THE SETTING OF COVID-19 AND HOPES TO REDUCE  TRANSMISSION AND LIMIT PPE USE.

## 2018-11-30 NOTE — Unmapped (Deleted)
Discharge Summary    Admit date: 11/29/2018    Discharge date and time: 11/30/18    Discharge to:  Home    Discharge Service: Surg Transplant [facility])    Discharge Attending Physician: Phillips Grout Des*    Discharge  Diagnoses: S/p OLT    Secondary Diagnosis: Active Problems:    * No active hospital problems. *  Resolved Problems:    * No resolved hospital problems. *      OR Procedures:       Ancillary Procedures: no procedures    Discharge Day Services:     Subjective   Patient states that his chest pain has improved since time of admission. He feels well. Denies nausea or vomiting. No fevers or chills.     Objective   Patient Vitals for the past 8 hrs:   BP Temp Temp src Pulse Resp SpO2   11/30/18 0422 134/90 36.3 ??C Oral 86 16 100 %   11/30/18 0058 110/85 36.4 ??C Oral 91 16 98 %   11/30/18 0027 ??? ??? ??? 83 ??? ???   11/29/18 2315 ??? ??? ??? 90 ??? ???     No intake/output data recorded.    General Appearance:   No acute distress  Lungs:                Clear to auscultation bilaterally  Heart:                           Regular rate and rhythm  Abdomen:                Soft, non-tender, non-distended  Extremities:              Warm and well perfused      Hospital Course:  58 year old male with past medical history including cryptogenic cirrhosis and hepatocellular carcinoma now status post OLT on 09/16/2018, aortic valve stenosis, type 2 diabetes, autoimmune cholangitis, pleural effusion and pneumothorax as a result of initial liver biopsy 09/07/2018 now status post removal of 2 chest tubes who presented to the emergency department with chest pain on 10/6.     Initial workup, including CTA, EKG, UA were unremarkable. Troponin levels peaked at 0.039 and downtrended. MRI obtained for [facility] surveillance which showed stable posterior hepatic fluid collection (post-op hematoma unchanged in size) and loculated ascites along the falciform ligament, no enhancement to suggest abscess. No evidence of HCC.     His hospital course was unremarkable. His pain was well controlled with PO oxycodone and had improved by time of discharge. He is voiding spontaneously and tolerating PO intake. Return precautions given. Plan to follow up in clinic.       Condition at Discharge: Improved  Discharge Medications:      Medication List      ASK your doctor about these medications    ??? acetaminophen 325 MG tablet; Commonly known as: TylenoL; Take 2 tablets   (650 mg total) by mouth every six (6) hours as needed for pain or fever (>   38C or 100.31F).  ??? amLODIPine 5 MG tablet; Commonly known as: NORVASC; Take 2 tablets (10   mg total) by mouth daily.  ??? aspirin 81 MG tablet; Commonly known as: ECOTRIN; Take 1 tablet (81 mg   total) by mouth daily.  ??? azelastine 137 mcg (0.1 %) nasal spray; Commonly known as: ASTELIN  ??? blood sugar diagnostic Strp; Use as directed Three (3) times  a day   before meals.  ??? calcium carbonate 200 mg calcium (500 mg) chewable tablet; Commonly   known as: TUMS  ??? cholecalciferol (vitamin D3) 1,000 unit (25 mcg) tablet; Take 2 tablets   (2,000 Units total) by mouth daily.  ??? cyclobenzaprine 5 MG tablet; Commonly known as: FLEXERIL; Take 1 tablet   (5 mg total) by mouth Three (3) times a day as needed for muscle spasms.  ??? ergocalciferol 1,250 mcg (50,000 unit) capsule; Commonly known as:   DRISDOL; Take 1 capsule (50,000 Units total) by mouth once a week for 4   doses.  ??? EUCRISA 2 % Oint; Generic drug: crisaborole  ??? fluticasone propionate 50 mcg/actuation nasal spray; Commonly known as:   FLONASE  ??? lancets 33 gauge Misc; 1 each by Miscellaneous route Three (3) times a   day before meals.  ??? levothyroxine 150 MCG tablet; Commonly known as: SYNTHROID; Take 1   tablet (150 mcg total) by mouth daily.  ??? LORazepam 0.5 MG tablet; Commonly known as: ATIVAN; Take one 0.5mg    tablet 45-60 minutes before arrival to hospital. Take one 0.5mg  tablet   upon arrival to hospital for procedure.  ??? magnesium (amino acid chelate) 133 mg Tab; Generic drug: magnesium   oxide-Mg AA chelate; Take 1 tablet by mouth Two (2) times a day. HOLD   until directed to start by your coordinator.  ??? metroNIDAZOLE 1 % gel; Commonly known as: METROGEL  ??? mycophenolate 180 MG EC tablet; Commonly known as: MYFORTIC; Take 2   tablets (360 mg total) by mouth Two (2) times a day.  ??? oxyCODONE 5 MG immediate release tablet; Commonly known as: ROXICODONE;   Take 1-2 tablets (5-10 mg total) by mouth every six (6) hours as needed   for pain.  ??? pantoprazole 40 MG tablet; Commonly known as: PROTONIX; Take 1 tablet   (40 mg total) by mouth daily as needed.  ??? sertraline 25 MG tablet; Commonly known as: ZOLOFT; Take 1 tablet (25 mg   total) by mouth daily.  ??? sodium polystyrene sulfonate 15 gram/60 mL Susp; Commonly known as:   sodium polystyrene; Take 120 mL (30 g total) by mouth once for 1 dose.;   Ask about: Should I take this medication?  ??? sulfamethoxazole-trimethoprim 400-80 mg per tablet; Commonly known as:   BACTRIM; Take 1 tablet (80 mg of trimethoprim total) by mouth 3 (three)   times a week.  ??? tacrolimus 0.5 MG capsule; Commonly known as: PROGRAF; Take 5 capsules   (2.5 mg total) by mouth two (2) times a day. (Patient was taking 3 mg   daily on 11/21/2018) started 2.5 mg BID 11/21/2018 for PM dose  ??? TiZANidine 2 MG capsule; Commonly known as: ZANAFLEX  ??? valGANciclovir 450 mg tablet; Commonly known as: VALCYTE; Take 1 tablet   (450 mg total) by mouth daily.       Pending Test Results:     Discharge Instructions:  Activity: No restrictions    Diet: No restrictions    Other Instructions: Follow up at next clinic appointment    Labs and Other Follow-ups after Discharge:      Future Appointments:  Appointments which have been scheduled for you    Dec 05, 2018  1:15 PM  (Arrive by 12:45 PM)  RETURN 15 with Chirag Lanney Gins, MD  [facility] TRANSPLANT SURGERY [place] [facility]ION) [address]  [place] [address]  [phone]      Dec 19, 2018  9:10 AM  (Arrive by 8:40 AM)  LAB ONLY with LAB PHLEB GRND UNCW  LAB PHLEB GRND FLR [employer] [facility] REGION) [address]  [place] [address]  [phone]      Dec 19, 2018  9:40 AM  (Arrive by 9:10 AM)  RETURN PHARMD with Fleeta Emmer, CPP  [facility] TRANSPLANT SURGERY [facility] REGION) [address]  [place] [address]  [phone]      Dec 19, 2018 10:30 AM  (Arrive by 10:00 AM)  VIDEO VISIT- OTHER with Lanelle Bal, RD/LDN  [facility] NUTRITION SERVICES TRANSPLANT [place] [facility] REGION) [address]  [place] [address]  [phone]      Dec 19, 2018 11:45 AM  (Arrive by 11:15 AM)  MRI ABDOMEN W WO CONTRA    -UN with [facility] MRI RM 2  IMG MRI [facility]) [address]  [place] [address]  [phone]   On appt date:  Bring recent lab work  Bring documentation of any metal object implants  Take meds as usual  Check w/physician if diabetic  You will be asked to change into a gown for your safety    On appt date do not:  Consume anything 2 hrs  Wear metallic items including jewelry (we are not responsible for lost items)    Let us know if pt:  Claustrophobic  Metal object implant  Pregnant  Prescribed a sedative  On dialysis  Allergic to MRI dye/contrast  Kidney Failure    (Title:[id number])     Dec 19, 2018 12:45 PM  (Arrive by 12:15 PM)  XR ABDOMEN 1 VIEW with Toney Reil RM 11  IMG DIAG X-RAY [facility]) [address]  [place] [address]  [phone]      Dec 19, 2018  1:45 PM  (Arrive by 1:15 PM)  RETURN 15 with Chirag Lanney Gins, MD  [facility] TRANSPLANT SURGERY [facility] REGION) [address]  [place] [address]  [phone]      Dec 20, 2018 10:30 AM  (Arrive by 10:00 AM)  VIDEO VISIT- OTHER with Bjorn Pippin  [facility] LIVER TRANSPLANT [place] [facility] REGION) [address]  [place] [address] [phone]      Feb 01, 2019 10:00 AM  (Arrive by 9:30 AM)  NEW  NON PROCEDURAL with Kathie Dike, MD  [facility] PAIN MANAGEMENT CENTER [address] [place] [facility] REGION) [address] [place] [address]  [phone]      Mar 20, 2019  8:30 AM  (Arrive by 8:00 AM)  VIDEO VISIT- OTHER with Adalberto Cole, PhD  [facility] TRANSPLANT SURGERY [place] [facility] REGION) [address]  [place] [address]  [phone]      Mar 20, 2019 10:20 AM  (Arrive by 9:50 AM)  RETURN PHARMD with Fleeta Emmer, CPP  [facility] TRANSPLANT SURGERY [place] [facility] REGION) [address]  [place] [address]  [phone]      Mar 20, 2019 11:00 AM  (Arrive by 10:30 AM)  VIDEO VISIT- OTHER with Lanelle Bal, RD/LDN  [facility] NUTRITION SERVICES TRANSPLANT  [facility] REGION) [address]  [place] [address]  [phone]      Mar 20, 2019  1:45 PM  (Arrive by 1:15 PM)  RETURN 15 with Chirag Lanney Gins, MD  [facility] TRANSPLANT SURGERY [place] [facility] REGION) [address]  [place] [address]  [phone]      Jun 19, 2019 12:45 PM  (Arrive by 12:15 PM)  RETURN 15 with Chirag Lanney Gins, MD  [facility] TRANSPLANT SURGERY [place] [facility] REGION) [address]  [place] [address]  [phone]      Jun 19, 2019  1:00 PM  (Arrive by 12:30 PM)  RETURN PHARMD with Fleeta Emmer, CPP  [facility] TRANSPLANT SURGERY [facility] REGION) [address]  [place] [address]  [phone]

## 2018-11-30 NOTE — Unmapped (Addendum)
Tacrolimus Therapeutic Monitoring Pharmacy Note    Georgiann Hahn is a 58 y.o. male continuing tacrolimus.     Indication: Liver transplant     Date of Transplant: 09/16/18      Prior Dosing Information: Current regimen 2.5 mg BID      Goals:  Therapeutic Drug Levels  Tacrolimus trough goal: 8-10 ng/mL    Additional Clinical Monitoring/Outcomes  ?? Monitor renal function (SCr and urine output) and liver function (LFTs)  ?? Monitor for signs/symptoms of adverse events (e.g., hyperglycemia, hyperkalemia, hypomagnesemia, hypertension, headache, tremor)    Results:   Tacrolimus level: 9.5 ng/mL, this is an 8 hour level    Pharmacokinetic Considerations and Significant Drug Interactions:  ? Concurrent hepatotoxic medications: None identified  ? Concurrent CYP3A4 substrates/inhibitors: None identified  ? Concurrent nephrotoxic medications: None identified    Assessment/Plan:  Recommendedation(s)  ? Continue current regimen of 2.5 mg BID    Follow-up  ? Tacrolimus levels ordered daily at 0500.   ? A pharmacist will continue to monitor and recommend levels as appropriate    Please page service pharmacist with questions/clarifications.    Josem Kaufmann, PharmD, BCCCP  Pager: [phone]

## 2018-12-01 DIAGNOSIS — Z79899 Other long term (current) drug therapy: Secondary | ICD-10-CM

## 2018-12-01 DIAGNOSIS — E612 Magnesium deficiency: Secondary | ICD-10-CM

## 2018-12-01 DIAGNOSIS — R188 Other ascites: Secondary | ICD-10-CM

## 2018-12-01 DIAGNOSIS — I10 Essential (primary) hypertension: Secondary | ICD-10-CM

## 2018-12-01 DIAGNOSIS — Z944 Liver transplant status: Secondary | ICD-10-CM

## 2018-12-01 DIAGNOSIS — K7460 Unspecified cirrhosis of liver: Secondary | ICD-10-CM

## 2018-12-01 DIAGNOSIS — C220 Liver cell carcinoma: Secondary | ICD-10-CM

## 2018-12-01 DIAGNOSIS — K8309 Other cholangitis: Secondary | ICD-10-CM

## 2018-12-01 DIAGNOSIS — E119 Type 2 diabetes mellitus without complications: Secondary | ICD-10-CM

## 2018-12-01 DIAGNOSIS — Z20828 Contact with and (suspected) exposure to other viral communicable diseases: Secondary | ICD-10-CM

## 2018-12-01 DIAGNOSIS — Z5181 Encounter for therapeutic drug level monitoring: Secondary | ICD-10-CM | POA: Diagnosis not present

## 2018-12-01 DIAGNOSIS — M545 Low back pain: Secondary | ICD-10-CM | POA: Diagnosis not present

## 2018-12-01 LAB — TACROLIMUS, TROUGH: Lab: 10.3

## 2018-12-01 MED ORDER — GENERIC EXTERNAL MEDICATION
Status: DC
Start: ? — End: 2018-12-01

## 2018-12-01 MED ORDER — SULFAMETHOXAZOLE-TRIMETHOPRIM 400-80 MG PO TABS
1.00 | ORAL_TABLET | ORAL | Status: DC
Start: 2018-12-02 — End: 2018-12-01

## 2018-12-01 MED ORDER — MYCOPHENOLATE SODIUM 360 MG PO TBEC
360.00 | DELAYED_RELEASE_TABLET | ORAL | Status: DC
Start: 2018-12-01 — End: 2018-12-01

## 2018-12-01 MED ORDER — DEXTROSE 50 % IV SOLN
12.50 | INTRAVENOUS | Status: DC
Start: ? — End: 2018-12-01

## 2018-12-01 MED ORDER — VALGANCICLOVIR HCL 450 MG PO TABS
450.00 | ORAL_TABLET | ORAL | Status: DC
Start: 2018-12-02 — End: 2018-12-01

## 2018-12-01 MED ORDER — CHOLECALCIFEROL 25 MCG (1000 UT) PO TABS
2000.00 | ORAL_TABLET | ORAL | Status: DC
Start: 2018-12-02 — End: 2018-12-01

## 2018-12-01 MED ORDER — GENERIC EXTERNAL MEDICATION
2.50 | Status: DC
Start: 2018-12-02 — End: 2018-12-01

## 2018-12-01 MED ORDER — GENERIC EXTERNAL MEDICATION
137.00 | Status: DC
Start: 2018-12-02 — End: 2018-12-01

## 2018-12-01 MED ORDER — INSULIN LISPRO 100 UNIT/ML ~~LOC~~ SOLN
0.00 | SUBCUTANEOUS | Status: DC
Start: 2018-12-01 — End: 2018-12-01

## 2018-12-01 MED ORDER — VITAMIN D (ERGOCALCIFEROL) 1.25 MG (50000 UNIT) PO CAPS
50000.00 | ORAL_CAPSULE | ORAL | Status: DC
Start: 2018-12-06 — End: 2018-12-01

## 2018-12-01 MED ORDER — AZELASTINE HCL 0.1 % NA SOLN
1.00 | NASAL | Status: DC
Start: ? — End: 2018-12-01

## 2018-12-01 MED ORDER — AMLODIPINE BESYLATE 10 MG PO TABS
10.00 | ORAL_TABLET | ORAL | Status: DC
Start: 2018-12-02 — End: 2018-12-01

## 2018-12-01 MED ORDER — ONDANSETRON HCL 4 MG/2ML IJ SOLN
4.00 | INTRAMUSCULAR | Status: DC
Start: ? — End: 2018-12-01

## 2018-12-01 MED ORDER — ASPIRIN 81 MG PO CHEW
81.00 | CHEWABLE_TABLET | ORAL | Status: DC
Start: 2018-12-02 — End: 2018-12-01

## 2018-12-01 MED ORDER — PANTOPRAZOLE SODIUM 40 MG PO TBEC
40.00 | DELAYED_RELEASE_TABLET | ORAL | Status: DC
Start: 2018-12-02 — End: 2018-12-01

## 2018-12-01 MED ORDER — TIZANIDINE HCL 2 MG PO TABS
2.00 | ORAL_TABLET | ORAL | Status: DC
Start: 2018-12-01 — End: 2018-12-01

## 2018-12-01 MED ORDER — CYCLOBENZAPRINE HCL 10 MG PO TABS
5.00 | ORAL_TABLET | ORAL | Status: DC
Start: ? — End: 2018-12-01

## 2018-12-01 MED ORDER — DOCUSATE SODIUM 100 MG PO CAPS
100.00 | ORAL_CAPSULE | ORAL | Status: DC
Start: 2018-12-02 — End: 2018-12-01

## 2018-12-01 MED ORDER — FLUTICASONE PROPIONATE 50 MCG/ACT NA SUSP
1.00 | NASAL | Status: DC
Start: ? — End: 2018-12-01

## 2018-12-01 MED ORDER — HYDROMORPHONE HCL 1 MG/ML IJ SOLN
0.50 | INTRAMUSCULAR | Status: DC
Start: ? — End: 2018-12-01

## 2018-12-01 MED ORDER — HEPARIN SODIUM (PORCINE) 5000 UNIT/ML IJ SOLN
5000.00 | INTRAMUSCULAR | Status: DC
Start: 2018-12-01 — End: 2018-12-01

## 2018-12-01 MED ORDER — SERTRALINE HCL 25 MG PO TABS
25.00 | ORAL_TABLET | ORAL | Status: DC
Start: 2018-12-02 — End: 2018-12-01

## 2018-12-01 NOTE — Unmapped (Signed)
Pt Aox4 and follows commands. Pt is up ab lib and voids independently. Pt states he has pain but is tolerable and refused pain medications. Telemetry on. Bed locked, low position and call bell in reach. Will continue to monitor.   Problem: Adult Inpatient Plan of Care  Goal: Plan of Care Review  Outcome: Progressing  Goal: Patient-Specific Goal (Individualization)  Outcome: Progressing  Flowsheets (Taken 11/30/2018 1753)  Patient-Specific Goals (Include Timeframe): Pt will ambulate around the unit once during the shift by 12/01/18.  Goal: Absence of Hospital-Acquired Illness or Injury  Outcome: Progressing  Goal: Optimal Comfort and Wellbeing  Outcome: Progressing  Goal: Readiness for Transition of Care  Outcome: Progressing  Goal: Rounds/Family Conference  Outcome: Progressing     Problem: Diabetes Comorbidity  Goal: Blood Glucose Level Within Desired Range  Outcome: Progressing     Problem: Hypertension Comorbidity  Goal: Blood Pressure in Desired Range  Outcome: Progressing     Problem: Wound  Goal: Optimal Wound Healing  Outcome: Progressing

## 2018-12-01 NOTE — Unmapped (Signed)
Transplant Surgery Progress Note    Hospital Day: 3    Assessment:   58 year old male with past medical history as stated below including cryptogenic cirrhosis and hepatocellular carcinoma now status post OLT on 09/16/2018, aortic valve stenosis, type 2 diabetes, autoimmune cholangitis, pleural effusion and pneumothorax as a result of initial liver biopsy 09/07/2018 now status post removal of 2 chest tubes who presents to the emergency department with chest pain starting approximately 7 hours prior to arrival, center chest without radiation with associated shortness of breath that has been persistent and unchanged since liver transplantation    Subjective/Interval Events:   NAEON. Cardiac workup overwhelmingly negative. MRI notable for loculated fluid collection along falciform ligament w/o enhancement. Doing well this morning, though notes MSK pain in chest and back.     Plan:   -Ortho c/s for chest and back pain  -Plan for IR drainage of fluid collection tomorrow, NPO at midnight  -Pain control with flexeril, prn dilaudid/oxy  -HDS on room air  -SQH 5000q8  -Floor status    Objective:      Vital Signs:  BP 131/91  - Pulse 88  - Temp 36.9 ??C (98.4 ??F) (Oral)  - Resp 18  - Ht 172.7 cm (5' 8)  - Wt 79 kg (174 lb 2.6 oz)  - SpO2 99%  - BMI 26.48 kg/m??     Physical Exam:    General: NAD  Neuro: Alert and oriented, conversational and appropriate  Cardiovascular: HDS  Pulmonary: Normal work of breathing on room air  Abdomen: Soft, non distended. non tender.   Extremities: Adequate peripheral perfusion      Chinita Pester MS4    I attest that I have reviewed the student note and that the components of the history of the present illness, the physical exam, and the assessment and plan documented were performed by me or were performed in my presence by the student where I verified the documentation and performed (or re-performed) the exam and medical decision making.    Maggie Font, MD, PGY-1  General Surgery Resident

## 2018-12-01 NOTE — Unmapped (Signed)
VIR Post-Procedure Note    Procedure Name: US guided aspiration of abdominal fluid collection    Pre-Op Diagnosis & indication: abdominal fluid collection    Post-Op Diagnosis: Same as pre-operative diagnosis    VIR Providers (Attending Physician): Ammie Dalton, MD    Time out: Prior to the procedure, a time out was performed with all team members present. During the time out, the patient, procedure and procedure site when applicable were verbally verified by the team members and Dr. Ammie Dalton.    Description of procedure: Successful aspiration of 74 mL serous fluid under [place] with no complication.    Plan: None    Sedation:None    Estimated Blood Loss: less than 5 mL  Complications: None    See detailed procedure note with images in PACS [facility]).    The patient tolerated the procedure well without incident or complication.    Interventional Radiologist: Ammie Dalton, MD  Date/Time: 12/01/2018 8:01 AM

## 2018-12-01 NOTE — Unmapped (Signed)
Discharge Summary    Admit date: 11/29/2018    Discharge date and time: 12/01/18 3:52 PM     Discharge to:  Home    Discharge Service: Surg Transplant [facility])    Discharge Attending Physician: Phillips Grout Des*    Discharge  Diagnoses: abdominal fluid collection    Secondary Diagnosis: Active Problems:    * No active hospital problems. *  Resolved Problems:    * No resolved hospital problems. *      OR Procedures:  n/a     Ancillary Procedures: US guided aspiration of abdominal fluid collection    Discharge Day Services:     Subjective   No acute events overnight. Pain Controlled. No fever or chills.    Objective   Patient Vitals for the past 8 hrs:   BP Temp Temp src Pulse Resp SpO2   12/01/18 1049 109/80 ??? ??? 78 18 90 %   12/01/18 1016 138/97 ??? ??? 86 16 96 %   12/01/18 0944 131/91 ??? ??? 88 18 99 %   12/01/18 0915 136/93 ??? ??? 82 17 99 %   12/01/18 0901 133/94 ??? ??? 88 16 98 %   12/01/18 0845 132/95 ??? ??? 85 16 95 %   12/01/18 0824 149/88 36.9 ??C Oral 81 14 100 %   12/01/18 0800 118/86 ??? ??? 86 16 94 %   12/01/18 0755 136/85 ??? ??? 88 16 95 %   12/01/18 0750 135/83 ??? ??? 84 20 97 %   12/01/18 0745 140/90 ??? ??? 88 20 97 %   12/01/18 0718 134/94 36.8 ??C Oral 85 18 100 %   12/01/18 0701 ??? ??? ??? 82 ??? ???   12/01/18 0429 152/95 36.7 ??C Oral 80 16 ???     I/O this shift:  In: 120 [P.O.:120]  Out: -     General Appearance:    No acute distress  Lungs:                 Clear to auscultation bilaterally  Heart:                            Regular rate and rhythm  Abdomen:                 Soft, non-distended  Extremities:               Warm and well perfused        Hospital Course:  58 year old male with past medical history including cryptogenic cirrhosis and hepatocellular carcinoma now status post OLT on 09/16/2018, aortic valve stenosis, type 2 diabetes, autoimmune cholangitis, pleural effusion and pneumothorax as a result of initial liver biopsy 09/07/2018 now status post removal of 2 chest tubes who presented to the emergency department with chest pain on 10/6.     Initial workup, including CTA, EKG, UA were unremarkable. Troponin levels peaked at 0.039 and downtrended. MRI obtained for [facility] surveillance which showed stable posterior hepatic fluid collection (post-op hematoma unchanged in size) and loculated ascites along the falciform ligament, no enhancement to suggest abscess. No evidence of HCC.     VIR was consulted on 10/7 regarding fluid collections and patient was taken for percutaneous aspiration on 10/8. The aspirate was sent for culture and patient was empirically given two doses of Zosyn prior to discharge, plan to follow up with culture results.   Orthopaedics was consulted on 10/8 regarding patient's new onset back  pain. Plain films were negative for lytic lesions or fracture. He was advised to follow up with physical therapy outpatient.      Condition at Discharge: Improved  Discharge Medications:      Medication List      CONTINUE taking these medications    ??? acetaminophen 325 MG tablet; Commonly known as: TylenoL; Take 2 tablets   (650 mg total) by mouth every six (6) hours as needed for pain or fever (>   38C or 100.76F).  ??? amLODIPine 5 MG tablet; Commonly known as: NORVASC; Take 2 tablets (10   mg total) by mouth daily.  ??? aspirin 81 MG tablet; Commonly known as: ECOTRIN; Take 1 tablet (81 mg   total) by mouth daily.  ??? azelastine 137 mcg (0.1 %) nasal spray; Commonly known as: ASTELIN  ??? blood sugar diagnostic Strp; Use as directed Three (3) times a day   before meals.  ??? calcium carbonate 200 mg calcium (500 mg) chewable tablet; Commonly   known as: TUMS  ??? ergocalciferol 1,250 mcg (50,000 unit) capsule; Commonly known as:   DRISDOL; Take 1 capsule (50,000 Units total) by mouth once a week for 4   doses.  ??? EUCRISA 2 % Oint; Generic drug: crisaborole  ??? fluticasone propionate 50 mcg/actuation nasal spray; Commonly known as:   FLONASE  ??? lancets 33 gauge Misc; 1 each by Miscellaneous route Three (3) times a   day before meals.  ??? levothyroxine 150 MCG tablet; Commonly known as: SYNTHROID; Take 1   tablet (150 mcg total) by mouth daily.  ??? magnesium (amino acid chelate) 133 mg Tab; Generic drug: magnesium   oxide-Mg AA chelate; Take 1 tablet by mouth Two (2) times a day. HOLD   until directed to start by your coordinator.  ??? metroNIDAZOLE 1 % gel; Commonly known as: METROGEL  ??? mycophenolate 180 MG EC tablet; Commonly known as: MYFORTIC; Take 2   tablets (360 mg total) by mouth Two (2) times a day.  ??? oxyCODONE 5 MG immediate release tablet; Commonly known as: ROXICODONE;   Take 1-2 tablets (5-10 mg total) by mouth every six (6) hours as needed   for pain.  ??? sertraline 25 MG tablet; Commonly known as: ZOLOFT; Take 1 tablet (25 mg   total) by mouth daily.  ??? sulfamethoxazole-trimethoprim 400-80 mg per tablet; Commonly known as:   BACTRIM; Take 1 tablet (80 mg of trimethoprim total) by mouth 3 (three)   times a week.  ??? tacrolimus 0.5 MG capsule; Commonly known as: PROGRAF; Take 5 capsules   (2.5 mg total) by mouth two (2) times a day. (Patient was taking 3 mg   daily on 11/21/2018) started 2.5 mg BID 11/21/2018 for PM dose  ??? TiZANidine 2 MG capsule; Commonly known as: ZANAFLEX  ??? valGANciclovir 450 mg tablet; Commonly known as: VALCYTE; Take 1 tablet   (450 mg total) by mouth daily.       Pending Test Results:     Discharge Instructions:  Activity:     Diet:    Other Instructions:  Other Instructions     Discharge instructions      Activity: As tolerated    Diet: Regular    Contact your transplant coordinator or the Transplant Surgery Office [phone]) during business hours or page the transplant coordinator on call [phone]) after business hours for:    - fever >100.5 degrees F by mouth, any fever with shaking chills, or other signs or symptoms of infection   -  uncontrolled nausea, vomiting, or diarrhea; inability to have a bowel movement for > 3 days.   - any problem that prevents taking medications as scheduled.   - pain uncontrolled with prescribed medication or new pain or tenderness at the surgical site   - sudden weight gain or increase in blood pressure (greater than 140/85)   - shortness of breath, chest pain / discomfort   - new or increasing jaundice   - urinary symptoms including pain / difficulty / burning or tea-colored urine   - any other new or concerning symptoms   - questions regarding your medications or continuing care             Labs and Other Follow-ups after Discharge:  Follow Up instructions and Outpatient Referrals     Discharge instructions            Future Appointments:  Appointments which have been scheduled for you    Dec 05, 2018  1:15 PM  (Arrive by 12:45 PM)  RETURN 15 with Chirag Lanney Gins, MD  [facility] TRANSPLANT SURGERY [place] [facility] REGION) [address]  [place] [address]  [phone]      Dec 19, 2018  9:10 AM  (Arrive by 8:40 AM)  LAB ONLY with LAB PHLEB GRND UNCW  LAB PHLEB GRND [facility] REGION) [address]  [place] [address]  [phone]      Dec 19, 2018  9:40 AM  (Arrive by 9:10 AM)  RETURN PHARMD with Fleeta Emmer, CPP  [facility] TRANSPLANT SURGERY [place] [facility] REGION) [address]  [place] [address]  [phone]      Dec 19, 2018 10:30 AM  (Arrive by 10:00 AM)  VIDEO VISIT- OTHER with Lanelle Bal, RD/LDN  [facility] NUTRITION SERVICES TRANSPLANT [place] [facility] REGION) [address]  [place] [address]  [phone]      Dec 19, 2018 11:45 AM  (Arrive by 11:15 AM)  MRI ABDOMEN W WO CONTRA    -UN with [facility] MRI RM 2  IMG MRI [facility]) [address]  [place] [address]  [phone]   On appt date:  Bring recent lab work  Bring documentation of any metal object implants  Take meds as usual  Check w/physician if diabetic  You will be asked to change into a gown for your safety    On appt date do not:  Consume anything 2 hrs  Wear metallic items including jewelry (we are not responsible for lost items)    Let us know if pt:  Claustrophobic  Metal object implant  Pregnant  Prescribed a sedative  On dialysis  Allergic to MRI dye/contrast  Kidney Failure    (Title:MRIWCNTRST)     Dec 19, 2018 12:45 PM  (Arrive by 12:15 PM)  XR ABDOMEN 1 VIEW with Toney Reil RM 11  IMG DIAG X-RAY [facility]) [address]  [place] [address]  [phone]      Dec 19, 2018  1:45 PM  (Arrive by 1:15 PM)  RETURN 15 with Chirag Lanney Gins, MD  [facility] TRANSPLANT SURGERY [place] [facility] REGION) [address]  [place] [address]  [phone]      Dec 20, 2018 10:30 AM  (Arrive by 10:00 AM)  VIDEO VISIT- OTHER with Bjorn Pippin  [facility] LIVER TRANSPLANT [place] [facility]  REGION) [address]  [place] [address]  [phone]      Feb 01, 2019 10:00 AM  (Arrive by 9:30 AM)  NEW  NON PROCEDURAL with Kathie Dike, MD  [facility] PAIN MANAGEMENT CENTER [address] [place] [facility] REGION) [address] [place] [address]  [phone]      Mar 20, 2019  8:30 AM  (Arrive by 8:00 AM)  VIDEO VISIT- OTHER with Adalberto Cole, PhD  [facility] TRANSPLANT SURGERY [place] [facility] REGION) [address]  [place] [address]  [phone]      Mar 20, 2019 10:20 AM  (Arrive by 9:50 AM)  RETURN PHARMD with Fleeta Emmer, CPP  [facility] TRANSPLANT SURGERY [place] [facility] REGION) [address]  [place] [address]  [phone]      Mar 20, 2019 11:00 AM  (Arrive by 10:30 AM)  VIDEO VISIT- OTHER with Lanelle Bal, RD/LDN  [facility] NUTRITION SERVICES TRANSPLANT [place] [facility] REGION) [address]  [place] [address]  [phone]      Mar 20, 2019  1:45 PM  (Arrive by 1:15 PM)  RETURN 15 with Chirag Lanney Gins, MD  [facility] TRANSPLANT SURGERY [facility] REGION) [address]  [place] [address]  [phone]      Jun 19, 2019 12:45 PM  (Arrive by 12:15 PM)  RETURN 15 with Chirag Lanney Gins, MD  [facility] TRANSPLANT SURGERY [place] [facility] REGION) [address]  [place] [address]  [phone]      Jun 19, 2019  1:00 PM  (Arrive by 12:30 PM)  RETURN PHARMD with Fleeta Emmer, CPP  [facility] TRANSPLANT SURGERY [place] [facility] REGION) [address]  [place] [address]  [phone]

## 2018-12-01 NOTE — Unmapped (Signed)
Pt is A&Ox4 and to follow commands. Pt was able to rest most of the night. Pt required PRN pain medication once this shift and tolerated well. Pt had no neuro change or falls all shift. Pt was NPO at midnight. Pt did require insulin coverage this shift. Telemetry did not call all shift for any abnormalities. Pt VSS, bed alarm on, bed in lowest position, call light within reach, and will continue to monitor.    Problem: Adult Inpatient Plan of Care  Goal: Plan of Care Review  Outcome: Progressing  Goal: Patient-Specific Goal (Individualization)  Outcome: Progressing  Flowsheets (Taken 12/01/2018 0631)  Patient-Specific Goals (Include Timeframe): Pt will be NPO at 0000 for procedure during the morning  Goal: Absence of Hospital-Acquired Illness or Injury  Outcome: Progressing  Goal: Optimal Comfort and Wellbeing  Outcome: Progressing  Goal: Readiness for Transition of Care  Outcome: Progressing  Goal: Rounds/Family Conference  Outcome: Progressing     Problem: Diabetes Comorbidity  Goal: Blood Glucose Level Within Desired Range  Outcome: Progressing     Problem: Hypertension Comorbidity  Goal: Blood Pressure in Desired Range  Outcome: Progressing     Problem: Wound  Goal: Optimal Wound Healing  Outcome: Progressing

## 2018-12-01 NOTE — Unmapped (Signed)
SRF CASE REVIEW    An interdisciplinary care conference was held today and included the following team members: Estanislado Emms, MD Transplant Surgeon   Chirag Lanney Gins, MD Transplant Surgeon  Cranston Neighbor, RN, CCTN Inpatient Transplant Nurse Coordinator  Rolly Salter, RN, Transplant Nurse Coordinator  Lisbeth Ply, MD Transplant Nephrology  Carole Binning, LCSW/ CM Transplant Social Worker  Vanita Panda, LCSW/ CM Transplant Social Worker  Valentino Nose, [place] Transplant Dietitian  Tawni Carnes, MD Transplant Hepatology Fellow  Gertie Fey, DNP Transplant Clinic Nurse Practitioner  Geralyn Corwin, [place] Transplant Surgery Physician Assistant  Odessa Fleming, PharmD  Lysle Rubens, MD General Surgery Resident  Trilby Leaver, MD Transplant Surgery Fellow  Newton Pigg, RN, DNP, Transplant Surgery Nurse Coordinator  Maggie Font, MD General Surgery  Doyce Loose, MD Transplant Surgeon      Per team, admitted on 10/7 2/2 chest pain; cardiac w/u negative and was to be discharged on 10/7; pt was due for annual scan and found fluid collection; in IR for aspiration of fluid collections     Discharge Plan: home with no needs; 10/9?      Carole Binning, LCSWA  Transplant Case Manager  [facility] for [facility]

## 2018-12-01 NOTE — Unmapped (Signed)
DAY OF IR PROCEDURE UPDATE     H&P reviewed. The patient was examined and there are no changes to the H&P.     Airway assessment: Class 2 - Can visualize soft palate and fauces, tip of uvula is obscured

## 2018-12-01 NOTE — Unmapped (Signed)
ORTHOPAEDIC SPINE CONSULT  - Primary Service for this Patient: Surg Transplant (SRF).    ASSESSMENT AND PLAN:  Anthony Alvarado is a 58 y.o.male with a history of hepatocellular carcinoma now status post liver transplant on 09/16/2018, aortic valve stenosis, type 2 diabetes, autoimmune cholangitis, pleural effusion and pneumothorax. Admitted for new abdominal fluid collection s/p IR drainage.  Seen in consultation at the request of Chirag Sureshchandra Des* for the evaluation of the following:     -Low back pain: Patient neurologically intact on exam.  No tenderness to palpation of midline spine.  Lumbar spine XRs with demonstration of multilevel degenerative changes.  ?? No surgical intervention indicated  ?? Recommend outpatient physical therapy  ?? Continue over-the-counter pain medications  ?? Encourage activity as tolerated  ?? May follow-up outpatient if symptoms worsen or persist  ?? Okay for discharge from orthopedic perspective     Weight Bearing Status/Activity: weightbearing as tolerated.  - Recommended Additional Labs: No new labs needed.  - Pain control: per primary service.  - Tobacco use: None  - Best contact number: [phone] (home)    - Follow-up plan: as needed      PROCEDURE(S)   No procedures performed.      SUBJECTIVE     Chief Complaint:  Low back pain    History of Present Illness:   Anthony Alvarado is a 58 y.o. male with history as below, admitted on 10/6 for new chest pain found to have abdominal fluid collection that was drained by IR on 10/8.  We were consulted for low back pain. He describes a dull pain in the lumbar region of his back that has been present since July 2020, following his transplant procedure. He states that it is worse in the evening and better in the mornings.  States that nothing in particular makes it worse. He denies shooting pains or radiating pain in his BLEs.  He does endorse intermittent weakness in his legs.  Does not have issues ambulating without assistance.  Has taken Tylenol for the pain which offers moderate relief.  No other previous treatments.  Also endorses intermittent neck pain without radiation into his bilateral upper extremities.  Worse with movement and better with rest.  Denies numbness and tingling in his extremities.  Denies unintentional weight loss.  Denies fevers or chills. Denies saddle anesthesia.  Denies back bowel or bladder issues.      Medical History   Past Medical History:   Diagnosis Date   ??? Aortic valve stenosis    ??? Autoimmune cholangitis    ??? 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 Cholestatic cirrhosis (CMS-HCC)    ??? Hepatic encephalopathy (CMS-HCC)    ??? Hypertension    ??? Sleep apnea    ??? Type 2 diabetes mellitus (CMS-HCC)       Surgical History   Past Surgical History:   Procedure Laterality Date   ??? APPENDECTOMY     ??? CHG US GUIDE, TISSUE ABLATION N/A 05/03/2018    Procedure: ULTRASOUND GUIDANCE FOR, AND MONITORING OF, PARENCHYMAL TISSUE ABLATION;  Surgeon: Particia Nearing, MD;  Location: MAIN OR [facility];  Service: Transplant   ??? PR CATH PLACE/CORON ANGIO, IMG SUPER/INTERP,R&L HRT CATH, L HRT VENTRIC N/A 03/21/2018    Procedure: Left/Right Heart Catheterization;  Surgeon: Neal Dy, MD;  Location: [facility] CATH;  Service: Cardiology   ??? PR LAP,ABLAT 1+ LIVER TUMOR(S),RADIOFREQ N/A 05/03/2018    Procedure: LAPAROSCOPY, SURGICAL, ABLATION OF 1  OR MORE LIVER TUMOR(S); RADIOFREQUENCY;  Surgeon: Particia Nearing, MD;  Location: MAIN OR [facility];  Service: Transplant   ??? PR TRANSPLANT LIVER,ALLOTRANSPLANT Bilateral 09/16/2018    Procedure: LIVER ALLOTRANSPLANTATION; ORTHOTOPIC, PARTIAL OR WHOLE, FROM CADAVER OR LIVING DONOR, ANY AGE;  Surgeon: Florene Glen, MD;  Location: MAIN OR [facility];  Service: Transplant   ??? PR TRANSPLANT,PREP DONOR LIVER, WHOLE N/A 09/16/2018    Procedure: [facility] STD PREP CAD DONOR WHOLE LIVER GFT PRIOR TNSPLNT,INC CHOLE,DISS/REM SURR TISSU WO TRISEG/LOBE SPLT;  Surgeon: Florene Glen, MD;  Location: MAIN OR [place];  Service: Transplant      Medications     Current Facility-Administered Medications:   ???  amLODIPine (NORVASC) tablet 10 mg, 10 mg, Oral, Daily, Dorina Hoyer, MD, 10 mg at 12/01/18 0914  ???  aspirin chewable tablet 324 mg, 324 mg, Oral, Once, Ellard Artis, MD, Stopped at 11/29/18 1118  ???  aspirin chewable tablet 81 mg, 81 mg, Oral, Daily, Dorina Hoyer, MD, 81 mg at 12/01/18 0914  ???  azelastine (ASTELIN) 137 mcg (0.1 %) nasal spray 1 spray, 1 spray, Each Nare, Daily PRN, Dorina Hoyer, MD  ???  calcium carbonate (TUMS) chewable tablet 200 mg elem calcium, 200 mg elem calcium, Oral, Daily PRN, Dorina Hoyer, MD  ???  cholecalciferol (vitamin D3) tablet 2,000 Units, 2,000 Units, Oral, Daily, Dorina Hoyer, MD, 2,000 Units at 12/01/18 0915  ???  cyclobenzaprine (FLEXERIL) tablet 5 mg, 5 mg, Oral, TID PRN, Dorina Hoyer, MD  ???  dextrose 50 % in water (D50W) 50 % solution 12.5 g, 12.5 g, Intravenous, Q10 Min PRN, Dorina Hoyer, MD  ???  docusate sodium (COLACE) capsule 100 mg, 100 mg, Oral, Daily, Hanaan Michell Heinrich, MD, 100 mg at 12/01/18 1040  ???  ergocalciferol (DRISDOL) capsule 50,000 Units, 50,000 Units, Oral, Weekly, Dorina Hoyer, MD, Stopped at 11/29/18 1950  ???  fluticasone propionate (FLONASE) 50 mcg/actuation nasal spray 1 spray, 1 spray, Each Nare, Daily PRN, Dorina Hoyer, MD  ???  heparin (porcine) injection 5,000 Units, 5,000 Units, Subcutaneous, Q8H SCH, Dorina Hoyer, MD, 5,000 Units at 12/01/18 1454  ???  HYDROmorphone (PF) (DILAUDID) injection 0.5 mg, 0.5 mg, Intravenous, Q4H PRN, Dorina Hoyer, MD  ???  insulin lispro (HumaLOG) injection 0-12 Units, 0-12 Units, Subcutaneous, ACHS, Dorina Hoyer, MD, 2 Units at 11/30/18 2151  ???  levothyroxine (SYNTHROID) tablet 137 mcg, 137 mcg, Oral, Daily, Dorina Hoyer, MD, 137 mcg at 12/01/18 0530  ???  mycophenolate (MYFORTIC) EC tablet 360 mg, 360 mg, Oral, BID, Dorina Hoyer, MD, 360 mg at 12/01/18 0916  ???  ondansetron [facility]) injection 4 mg, 4 mg, Intravenous, Q6H PRN, Dorina Hoyer, MD  ???  oxyCODONE (ROXICODONE) immediate release tablet 5 mg, 5 mg, Oral, Q4H PRN, 5 mg at 11/29/18 2035 **OR** oxyCODONE (ROXICODONE) immediate release tablet 10 mg, 10 mg, Oral, Q4H PRN, Dorina Hoyer, MD, 10 mg at 12/01/18 0723  ???  pantoprazole (PROTONIX) EC tablet 40 mg, 40 mg, Oral, Daily, Dorina Hoyer, MD  ???  piperacillin-tazobactam (ZOSYN) IVPB (premix) 3.375 g, 3.375 g, Intravenous, Q6H, Hanaan Michell Heinrich, MD, Last Rate: 100 mL/hr at 12/01/18 1120, 3.375 g at 12/01/18 1120  ???  sertraline (ZOLOFT) tablet 25 mg, 25 mg, Oral, Daily, Dorina Hoyer, MD, 25 mg at 12/01/18 1610  ???  sulfamethoxazole-trimethoprim (BACTRIM) 400-80 mg tablet 80 mg of trimethoprim, 1 tablet, Oral, 3x weekly, Dorina Hoyer, MD, 80 mg of trimethoprim at 11/30/18 0914  ???  tacrolimus (PROGRAF) capsule 2.5  mg, 2.5 mg, Oral, BID, Dorina Hoyer, MD, 2.5 mg at 12/01/18 0915  ???  tiZANidine (ZANAFLEX) tablet 2 mg, 2 mg, Oral, TID, Dorina Hoyer, MD, 2 mg at 12/01/18 1454  ???  valGANciclovir (VALCYTE) tablet 450 mg, 450 mg, Oral, Daily, Dorina Hoyer, MD, 450 mg at 12/01/18 0915    Facility-Administered Medications Ordered in Other Encounters:   ???  diazePAM (VALIUM) 5 mg/mL injection, , , ,    Allergies   Allergies   Allergen Reactions   ??? Watermelon Flavor      Mouth itching      ??   Social History Tobacco use:  reports that he has never smoked. He has never used smokeless tobacco.  Alcohol use:  reports previous alcohol use..  Drug use:  reports no history of drug use.  Employment: unknown  Prior ambulatory status: No limitations, community ambulator.   ??   Family History Family History   Problem Relation Age of Onset   ??? Asthma Mother    ??? COPD Father    ??? Cancer Father    ??? Autoimmune disease Sister         AIH   ??? Heart disease Sister    ??? Colon cancer Maternal Grandfather         ??     Review of Systems Constitutional: negative for fevers   Cardiovascular: negative for chest pain  Respiratory: negative for wheezing  Neurologic: negative for numbness/tingling  Musculoskeletal: negative for pain in other joints  Genitourinary: negative for painful urination       OBJECTIVE     Vitals:  Patient Vitals for the past 8 hrs:   BP Temp Temp src Pulse Resp SpO2   12/01/18 1338 145/87 ??? ??? 76 16 100 %   12/01/18 1223 110/77 ??? ??? 80 14 100 %   12/01/18 1122 105/70 ??? ??? 78 16 100 %   12/01/18 1049 109/80 ??? ??? 78 18 90 %   12/01/18 1016 138/97 ??? ??? 86 16 96 %   12/01/18 0944 131/91 ??? ??? 88 18 99 %   12/01/18 0915 136/93 ??? ??? 82 17 99 %   12/01/18 0901 133/94 ??? ??? 88 16 98 %   12/01/18 0845 132/95 ??? ??? 85 16 95 %   12/01/18 0824 149/88 36.9 ??C (98.4 ??F) Oral 81 14 100 %   12/01/18 0800 118/86 ??? ??? 86 16 94 %   12/01/18 0755 136/85 ??? ??? 88 16 95 %   12/01/18 0750 135/83 ??? ??? 84 20 97 %   12/01/18 0745 140/90 ??? ??? 88 20 97 %   12/01/18 0718 134/94 36.8 ??C (98.2 ??F) Oral 85 18 100 %     General:well-nourished and no acute distress    Motor R L  Reflexes R L   Deltoid 5 5  Tricep 2+ 2+   Bicep 5 5  Bicep 2+ 2+   Tricep 5 5  Brachiorad 2+ 2+   WE 5 5       Grip 5 5  Patellar 2+ 2+   IO 5 5  Achilles 2+ 2+   IP 5 5       Quad 5 5  Pathologic R L   TA 5 5  Hoffmann's neg. neg.   EHL 5 5  Babinski neg. neg.   GS 5 5  Clonus 1-2 beats 1-2 beats     Spine Exam ?? Inspection/palpation: No midline tenderness on spine. Mild pain  with palpation left cervical paraspinal musculature.  ?? Full and painless neck ROM with flexion, extension, lateral flexion/extension, rotation.   ?? Skin: No laceration, abrasion, ecchymosis, or other skin abnormality.  ?? Rectal tone: not assessed  ?? Negative Spurling  ?? Negative Lhermitte      Right upper extremity ?? Inspection/palpation: No swelling, erythema, deformity, atrophy or hypertrophy noted.  ?? Range of motion: full range of motion.  ?? Skin: No laceration, abrasion, ecchymosis, or other skin abnormality.     Left upper extremity ?? Inspection/palpation: No swelling, erythema, deformity, atrophy or hypertrophy noted.  ?? Range of motion: full range of motion.  ?? Skin: No laceration, abrasion, ecchymosis, or other skin abnormality.     Right lower extremity ?? Inspection/palpation: No swelling, erythema, deformity, atrophy or hypertrophy noted.  ?? Range of motion: full range of motion.  ?? Skin: No laceration, abrasion, ecchymosis, or other skin abnormality.     Left lower extremity ?? Inspection/palpation: No swelling, erythema, deformity, atrophy or hypertrophy noted.  ?? Range of motion: full range of motion.  ?? Skin: No laceration, abrasion, ecchymosis, or other skin abnormality.         Test Results  Imaging  Radiology studies were personally reviewed.  Lumbar spine XR: Multilevel degenerative changes, most prominent L3-L5.  No acute fracture or dislocation.    MRI abdomen: From segment of thoracolumbar spine visualized, multilevel degenerative disease, likely hemangioma T7 vertebral body.  No acute fracture or dislocation.    Labs  No labs today.  All lab results last 24 hours:    Recent Results (from the past 24 hour(s))   POCT Glucose    Collection Time: 11/30/18  9:47 PM   Result Value Ref Range    Glucose, POC 158 70 - 179 mg/dL   Tacrolimus Level, Trough    Collection Time: 12/01/18  4:30 AM   Result Value Ref Range    Tacrolimus, Trough 10.3 5.0 - 15.0 ng/mL   POCT Glucose    Collection Time: 12/01/18  7:16 AM   Result Value Ref Range    Glucose, POC 104 70 - 179 mg/dL   POCT Glucose    Collection Time: 12/01/18 10:43 AM   Result Value Ref Range    Glucose, POC 83 70 - 179 mg/dL       Problem List  Active Problems:    * No active hospital problems. *

## 2018-12-02 NOTE — Unmapped (Signed)
IV antibiotics completed as ordered prior to discharge. Pt discharged home with self care. No new medications ordered. Pt asked if he would be discharged with referral for physical therapy per Ortho recommendations. No referrals noted in discharge, on call SRT paged and stated he would let primary team know tomorrow to follow up with patient. All other questions answered.     Problem: Adult Inpatient Plan of Care  Goal: Plan of Care Review  Outcome: Resolved  Goal: Patient-Specific Goal (Individualization)  Outcome: Resolved  Goal: Absence of Hospital-Acquired Illness or Injury  Outcome: Resolved  Goal: Optimal Comfort and Wellbeing  Outcome: Resolved  Goal: Readiness for Transition of Care  Outcome: Resolved  Goal: Rounds/Family Conference  Outcome: Resolved     Problem: Diabetes Comorbidity  Goal: Blood Glucose Level Within Desired Range  Outcome: Resolved     Problem: Hypertension Comorbidity  Goal: Blood Pressure in Desired Range  Outcome: Resolved     Problem: Wound  Goal: Optimal Wound Healing  Outcome: Resolved

## 2018-12-02 NOTE — Unmapped (Signed)
Call placed to patient to check in post discharge.  He notes he is doing ok - still has some pain but slightly improved.  Discussed originally patient had appt with Dr. Celine Mans scheduled on 10/12 to review CT scan results however given he was admitted and just discharged yesterday will cancel this appt and have f/u visits as scheduled on 10/26 for 3 month f/u.      Patient asked about original CT scan results as he did not remember team reviewing these with him during inpatient stay.  Confirmed for patient outpatient imaging and inpatient imaging were used in determination for patient to have fluid collection drained yesterday - reviewed fluid cultures are pending and he would be contacted with any concerns upon these resulting.  He verbalized agreement.    Sent message to [facility] requesting cancellation of 10/12 appt.  Advised patient to continue outpatient labs.

## 2018-12-02 NOTE — Unmapped (Signed)
[facility] TRANSITIONS OF CARE                                                              SUMMARY NOTE    After a thorough review of the chart, this patient does not meet criteria for a Transitions Call at this time due to transition completed by specialty provider. Please contact [phone] with any further questions.    Lacey Jensen, RN  Transitions Case Technical sales engineer [facility]  [phone]

## 2018-12-05 DIAGNOSIS — E612 Magnesium deficiency: Principal | ICD-10-CM

## 2018-12-05 DIAGNOSIS — Z79899 Other long term (current) drug therapy: Principal | ICD-10-CM

## 2018-12-05 DIAGNOSIS — Z944 Liver transplant status: Principal | ICD-10-CM

## 2018-12-06 DIAGNOSIS — Z944 Liver transplant status: Principal | ICD-10-CM

## 2018-12-06 DIAGNOSIS — E612 Magnesium deficiency: Principal | ICD-10-CM

## 2018-12-06 DIAGNOSIS — Z79899 Other long term (current) drug therapy: Principal | ICD-10-CM

## 2018-12-06 LAB — CBC W/ DIFFERENTIAL
BASOPHILS ABSOLUTE COUNT: 0.1 10*3/uL (ref 0.0–0.2)
BASOPHILS RELATIVE PERCENT: 2 %
EOSINOPHILS ABSOLUTE COUNT: 0.6 10*3/uL — ABNORMAL HIGH (ref 0.0–0.4)
EOSINOPHILS RELATIVE PERCENT: 7 %
HEMATOCRIT: 39.5 % (ref 37.5–51.0)
HEMOGLOBIN: 12.8 g/dL — ABNORMAL LOW (ref 13.0–17.7)
IMMATURE GRANULOCYTES: 0 %
LYMPHOCYTES ABSOLUTE COUNT: 1.8 10*3/uL (ref 0.7–3.1)
MEAN CORPUSCULAR HEMOGLOBIN CONC: 32.4 g/dL (ref 31.5–35.7)
MEAN CORPUSCULAR HEMOGLOBIN: 27.1 pg (ref 26.6–33.0)
MONOCYTES ABSOLUTE COUNT: 0.6 10*3/uL (ref 0.1–0.9)
MONOCYTES RELATIVE PERCENT: 8 %
NEUTROPHILS ABSOLUTE COUNT: 4.8 10*3/uL (ref 1.4–7.0)
NEUTROPHILS RELATIVE PERCENT: 60 %
PLATELET COUNT: 299 10*3/uL (ref 150–450)
RED BLOOD CELL COUNT: 4.72 x10E6/uL (ref 4.14–5.80)
RED CELL DISTRIBUTION WIDTH: 13.7 % (ref 11.6–15.4)
WHITE BLOOD CELL COUNT: 8 10*3/uL (ref 3.4–10.8)

## 2018-12-06 LAB — COMPREHENSIVE METABOLIC PANEL
A/G RATIO: 2 (ref 1.2–2.2)
ALBUMIN: 4.5 g/dL (ref 3.8–4.9)
ALKALINE PHOSPHATASE: 102 IU/L (ref 39–117)
ALT (SGPT): 19 IU/L (ref 0–44)
AST (SGOT): 20 IU/L (ref 0–40)
BILIRUBIN TOTAL: 0.2 mg/dL (ref 0.0–1.2)
BUN / CREAT RATIO: 14 (ref 9–20)
CALCIUM: 9.7 mg/dL (ref 8.7–10.2)
CHLORIDE: 103 mmol/L (ref 96–106)
CREATININE: 1.14 mg/dL (ref 0.76–1.27)
GLOBULIN, TOTAL: 2.3 g/dL (ref 1.5–4.5)
GLUCOSE: 124 mg/dL — ABNORMAL HIGH (ref 65–99)
POTASSIUM: 4.7 mmol/L (ref 3.5–5.2)
SODIUM: 138 mmol/L (ref 134–144)
TOTAL PROTEIN: 6.8 g/dL (ref 6.0–8.5)

## 2018-12-06 LAB — MAGNESIUM: Magnesium:MCnc:Pt:Ser/Plas:Qn:: 1.5 — ABNORMAL LOW

## 2018-12-06 LAB — GAMMA GLUTAMYL TRANSFERASE: Gamma glutamyl transferase:CCnc:Pt:Ser/Plas:Qn:: 38

## 2018-12-06 LAB — RED BLOOD CELL COUNT: Erythrocytes:NCnc:Pt:Bld:Qn:Automated count: 4.72

## 2018-12-06 LAB — BILIRUBIN, DIRECT: BILIRUBIN DIRECT: 0.11 mg/dL (ref 0.00–0.40)

## 2018-12-06 LAB — CREATININE: Creatinine:MCnc:Pt:Ser/Plas:Qn:: 1.14

## 2018-12-06 LAB — PHOSPHORUS, SERUM: Phosphate:MCnc:Pt:Ser/Plas:Qn:: 4

## 2018-12-06 LAB — BILIRUBIN DIRECT: Bilirubin.glucuronidated+Bilirubin.albumin bound:MCnc:Pt:Ser/Plas:Qn:: 0.11

## 2018-12-07 LAB — TACROLIMUS BLOOD: Tacrolimus:MCnc:Pt:Bld:Qn:LC/MS/MS: 7.9

## 2018-12-08 DIAGNOSIS — Z944 Liver transplant status: Principal | ICD-10-CM

## 2018-12-08 DIAGNOSIS — E612 Magnesium deficiency: Principal | ICD-10-CM

## 2018-12-08 DIAGNOSIS — Z79899 Other long term (current) drug therapy: Principal | ICD-10-CM

## 2018-12-08 NOTE — Unmapped (Signed)
Error

## 2018-12-09 DIAGNOSIS — Z944 Liver transplant status: Principal | ICD-10-CM

## 2018-12-09 DIAGNOSIS — Z79899 Other long term (current) drug therapy: Principal | ICD-10-CM

## 2018-12-09 DIAGNOSIS — E559 Vitamin D deficiency, unspecified: Principal | ICD-10-CM

## 2018-12-09 LAB — CBC W/ DIFFERENTIAL
BANDED NEUTROPHILS ABSOLUTE COUNT: 0.1 10*3/uL (ref 0.0–0.1)
BASOPHILS ABSOLUTE COUNT: 0.2 10*3/uL (ref 0.0–0.2)
EOSINOPHILS ABSOLUTE COUNT: 0.6 10*3/uL — ABNORMAL HIGH (ref 0.0–0.4)
EOSINOPHILS RELATIVE PERCENT: 7 %
HEMATOCRIT: 40.3 % (ref 37.5–51.0)
IMMATURE GRANULOCYTES: 1 %
LYMPHOCYTES RELATIVE PERCENT: 22 %
MEAN CORPUSCULAR HEMOGLOBIN CONC: 33.3 g/dL (ref 31.5–35.7)
MEAN CORPUSCULAR HEMOGLOBIN: 27.6 pg (ref 26.6–33.0)
MEAN CORPUSCULAR VOLUME: 83 fL (ref 79–97)
MONOCYTES ABSOLUTE COUNT: 0.7 10*3/uL (ref 0.1–0.9)
MONOCYTES RELATIVE PERCENT: 8 %
NEUTROPHILS ABSOLUTE COUNT: 5.7 10*3/uL (ref 1.4–7.0)
NEUTROPHILS RELATIVE PERCENT: 60 %
PLATELET COUNT: 320 10*3/uL (ref 150–450)
RED BLOOD CELL COUNT: 4.86 x10E6/uL (ref 4.14–5.80)
RED CELL DISTRIBUTION WIDTH: 13.8 % (ref 11.6–15.4)
WHITE BLOOD CELL COUNT: 9.3 10*3/uL (ref 3.4–10.8)

## 2018-12-09 LAB — COMPREHENSIVE METABOLIC PANEL
A/G RATIO: 1.9 (ref 1.2–2.2)
ALBUMIN: 4.6 g/dL (ref 3.8–4.9)
ALKALINE PHOSPHATASE: 108 IU/L (ref 39–117)
ALT (SGPT): 23 IU/L (ref 0–44)
AST (SGOT): 19 IU/L (ref 0–40)
BUN / CREAT RATIO: 22 — ABNORMAL HIGH (ref 9–20)
CALCIUM: 9.6 mg/dL (ref 8.7–10.2)
CHLORIDE: 104 mmol/L (ref 96–106)
CO2: 20 mmol/L (ref 20–29)
GLOBULIN, TOTAL: 2.4 g/dL (ref 1.5–4.5)
GLUCOSE: 113 mg/dL — ABNORMAL HIGH (ref 65–99)
POTASSIUM: 4.9 mmol/L (ref 3.5–5.2)
SODIUM: 138 mmol/L (ref 134–144)
TOTAL PROTEIN: 7 g/dL (ref 6.0–8.5)

## 2018-12-09 LAB — PHOSPHORUS, SERUM: Phosphate:MCnc:Pt:Ser/Plas:Qn:: 3.8

## 2018-12-09 LAB — BASOPHILS ABSOLUTE COUNT: Basophils:NCnc:Pt:Bld:Qn:Automated count: 0.2

## 2018-12-09 LAB — GAMMA GLUTAMYL TRANSFERASE: Gamma glutamyl transferase:CCnc:Pt:Ser/Plas:Qn:: 39

## 2018-12-09 LAB — MAGNESIUM: Magnesium:MCnc:Pt:Ser/Plas:Qn:: 1.7

## 2018-12-09 LAB — TACROLIMUS BLOOD: Tacrolimus:MCnc:Pt:Bld:Qn:LC/MS/MS: 7.6

## 2018-12-09 LAB — CREATININE: Creatinine:MCnc:Pt:Ser/Plas:Qn:: 1.13

## 2018-12-09 LAB — BILIRUBIN DIRECT: Bilirubin.glucuronidated+Bilirubin.albumin bound:MCnc:Pt:Ser/Plas:Qn:: 0.06

## 2018-12-09 MED ORDER — ERGOCALCIFEROL (VITAMIN D2) 1,250 MCG (50,000 UNIT) CAPSULE: 50000 [IU] | capsule | 0 refills | 28 days | Status: AC

## 2018-12-09 MED ORDER — TACROLIMUS 0.5 MG CAPSULE: 3 mg | capsule | Freq: Two times a day (BID) | 11 refills | 30 days | Status: AC

## 2018-12-09 NOTE — Unmapped (Signed)
[facility] Refill Coordination Note    Specialty Medication(s) to be Shipped:   Transplant: mycophenolate mofetil 180mg , tacrolimus 0.5mg  and valgancyclovir 450mg  (sent refill request for tacrolimus)    Other medication(s) to be shipped: aspirin and bactrim     Anthony Alvarado, DOB: October 03, 1960  Phone: [phone] (home)       All above HIPAA information was verified with patient.     Completed refill call assessment today to schedule patient's medication shipment from the [facility] [phone]).       Specialty medication(s) and dose(s) confirmed: Regimen is correct and unchanged.   Changes to medications: Anthony Alvarado reports no changes at this time.  Changes to insurance: No  Questions for the pharmacist: No    Confirmed patient received Welcome Packet with first shipment. The patient will receive a drug information handout for each medication shipped and additional FDA Medication Guides as required.       DISEASE/MEDICATION-SPECIFIC INFORMATION        N/A    SPECIALTY MEDICATION ADHERENCE     Medication Adherence    Patient reported X missed doses in the last month: 0  Specialty Medication: Mycophenolate 180mg   Patient is on additional specialty medications: Yes  Additional Specialty Medications: Tacrolimus0.5mg   Patient Reported Additional Medication X Missed Doses in the Last Month: 0  Patient is on more than two specialty medications: Yes  Specialty Medication: valGANciclovir 450 mg tablet (VALCYTE)  Patient Reported Additional Medication X Missed Doses in the Last Month: 0          Mycophenolate 180 mg: 14 days of medicine on hand   Tacrolimus 0.5 mg: 14 days of medicine on hand   valGANciclovir 450 mg tablet (VALCYTE): 14 days of medicine on hand     SHIPPING     Shipping address confirmed in Epic.     Delivery Scheduled: Yes, Expected medication delivery date: 12/16/2018.  However, Rx request for refills was sent to the provider as there are none remaining. Medication will be delivered via UPS to the home address in [employer] [place].    Anthony Alvarado   [facility] Specialty Technician

## 2018-12-12 DIAGNOSIS — Z79899 Other long term (current) drug therapy: Principal | ICD-10-CM

## 2018-12-12 DIAGNOSIS — E612 Magnesium deficiency: Principal | ICD-10-CM

## 2018-12-12 DIAGNOSIS — Z944 Liver transplant status: Principal | ICD-10-CM

## 2018-12-12 NOTE — Unmapped (Signed)
Anthony Alvarado 's VALCYTE shipment will be canceled  as a result of no refills remain on the prescription.      I have reached out to the patient and communicated the delay. We will not reschedule the medication due to MD denied request, pt aware and have removed this/these medication(s) from the work request.  We have canceled this work request.

## 2018-12-12 NOTE — Unmapped (Signed)
Per Dr. Celine Mans given MRI was completed during recent inpatient stay on 11/29/18 can cancel 12/19/18 3 month hcc screening MRI - sent message to [facility] to cancel MRI and notify patient.

## 2018-12-13 DIAGNOSIS — Z944 Liver transplant status: Principal | ICD-10-CM

## 2018-12-13 DIAGNOSIS — E612 Magnesium deficiency: Principal | ICD-10-CM

## 2018-12-13 DIAGNOSIS — Z79899 Other long term (current) drug therapy: Principal | ICD-10-CM

## 2018-12-13 LAB — COMPREHENSIVE METABOLIC PANEL
A/G RATIO: 2.1 (ref 1.2–2.2)
ALBUMIN: 4.6 g/dL (ref 3.8–4.9)
AST (SGOT): 19 IU/L (ref 0–40)
BILIRUBIN TOTAL: 0.3 mg/dL (ref 0.0–1.2)
BLOOD UREA NITROGEN: 19 mg/dL (ref 6–24)
BUN / CREAT RATIO: 20 (ref 9–20)
CALCIUM: 9.3 mg/dL (ref 8.7–10.2)
CHLORIDE: 106 mmol/L (ref 96–106)
CO2: 21 mmol/L (ref 20–29)
CREATININE: 0.95 mg/dL (ref 0.76–1.27)
GLOBULIN, TOTAL: 2.2 g/dL (ref 1.5–4.5)
GLUCOSE: 112 mg/dL — ABNORMAL HIGH (ref 65–99)
SODIUM: 141 mmol/L (ref 134–144)
TOTAL PROTEIN: 6.8 g/dL (ref 6.0–8.5)

## 2018-12-13 LAB — CBC W/ DIFFERENTIAL
BANDED NEUTROPHILS ABSOLUTE COUNT: 0.1 10*3/uL (ref 0.0–0.1)
BASOPHILS ABSOLUTE COUNT: 0.1 10*3/uL (ref 0.0–0.2)
BASOPHILS RELATIVE PERCENT: 1 %
EOSINOPHILS ABSOLUTE COUNT: 0.7 10*3/uL — ABNORMAL HIGH (ref 0.0–0.4)
EOSINOPHILS RELATIVE PERCENT: 7 %
HEMATOCRIT: 39.4 % (ref 37.5–51.0)
IMMATURE GRANULOCYTES: 1 %
LYMPHOCYTES RELATIVE PERCENT: 20 %
MEAN CORPUSCULAR HEMOGLOBIN CONC: 33.8 g/dL (ref 31.5–35.7)
MEAN CORPUSCULAR HEMOGLOBIN: 27.8 pg (ref 26.6–33.0)
MEAN CORPUSCULAR VOLUME: 82 fL (ref 79–97)
MONOCYTES ABSOLUTE COUNT: 0.7 10*3/uL (ref 0.1–0.9)
MONOCYTES RELATIVE PERCENT: 7 %
NEUTROPHILS ABSOLUTE COUNT: 6.4 10*3/uL (ref 1.4–7.0)
NEUTROPHILS RELATIVE PERCENT: 64 %
PLATELET COUNT: 288 10*3/uL (ref 150–450)
RED BLOOD CELL COUNT: 4.78 x10E6/uL (ref 4.14–5.80)
RED CELL DISTRIBUTION WIDTH: 13.2 % (ref 11.6–15.4)
WHITE BLOOD CELL COUNT: 9.9 10*3/uL (ref 3.4–10.8)

## 2018-12-13 LAB — GAMMA GLUTAMYL TRANSFERASE: Gamma glutamyl transferase:CCnc:Pt:Ser/Plas:Qn:: 43

## 2018-12-13 LAB — PLATELET COUNT: Platelets:NCnc:Pt:Bld:Qn:Automated count: 288

## 2018-12-13 LAB — BILIRUBIN DIRECT: Bilirubin.glucuronidated+Bilirubin.albumin bound:MCnc:Pt:Ser/Plas:Qn:: 0.1

## 2018-12-13 LAB — ALT (SGPT): Alanine aminotransferase:CCnc:Pt:Ser/Plas:Qn:: 24

## 2018-12-13 LAB — MAGNESIUM: Magnesium:MCnc:Pt:Ser/Plas:Qn:: 1.6

## 2018-12-13 LAB — PHOSPHORUS, SERUM: Phosphate:MCnc:Pt:Ser/Plas:Qn:: 3.3

## 2018-12-14 LAB — TACROLIMUS BLOOD: Tacrolimus:MCnc:Pt:Bld:Qn:LC/MS/MS: 8.1

## 2018-12-14 NOTE — Unmapped (Signed)
As per Dr. Celine Mans thru covering coord cancelled pt's abd MRI scheduled for pt's 3 mo appt on 10/26 since pt recently had test in house.    Called pt and went over his schedule with him, including the 2 virtual appts (SW is on 10/27 at home). Explained how virtual works and pt chose [employer], esp since he'll be in hospital for virtual nutr appt.  Pt unsure if he can print from [employer] he can print from computer. Told him this [facility] will send appt letter thru mychart this am so he can test it. Asked him to please let this [facility] know either way, and he verbalized understanding of all discussed.

## 2018-12-15 DIAGNOSIS — Z944 Liver transplant status: Principal | ICD-10-CM

## 2018-12-15 DIAGNOSIS — E612 Magnesium deficiency: Principal | ICD-10-CM

## 2018-12-15 DIAGNOSIS — Z79899 Other long term (current) drug therapy: Principal | ICD-10-CM

## 2018-12-15 MED FILL — SULFAMETHOXAZOLE 400 MG-TRIMETHOPRIM 80 MG TABLET: 28 days supply | Qty: 12 | Fill #2 | Status: AC

## 2018-12-15 MED FILL — MYCOPHENOLATE SODIUM 180 MG TABLET,DELAYED RELEASE: 30 days supply | Qty: 120 | Fill #3 | Status: AC

## 2018-12-15 MED FILL — TACROLIMUS 0.5 MG CAPSULE, IMMEDIATE-RELEASE: ORAL | 30 days supply | Qty: 300 | Fill #0

## 2018-12-15 MED FILL — MYCOPHENOLATE SODIUM 180 MG TABLET,DELAYED RELEASE: ORAL | 30 days supply | Qty: 120 | Fill #3

## 2018-12-15 MED FILL — ERGOCALCIFEROL (VITAMIN D2) 1,250 MCG (50,000 UNIT) CAPSULE: 28 days supply | Qty: 4 | Fill #0 | Status: AC

## 2018-12-15 MED FILL — TACROLIMUS 0.5 MG CAPSULE: 30 days supply | Qty: 300 | Fill #0 | Status: AC

## 2018-12-15 MED FILL — SULFAMETHOXAZOLE 400 MG-TRIMETHOPRIM 80 MG TABLET: ORAL | 28 days supply | Qty: 12 | Fill #2

## 2018-12-15 MED FILL — ERGOCALCIFEROL (VITAMIN D2) 1,250 MCG (50,000 UNIT) CAPSULE: ORAL | 28 days supply | Qty: 4 | Fill #0

## 2018-12-15 NOTE — Unmapped (Signed)
Pt request for RX Refill

## 2018-12-16 DIAGNOSIS — R739 Hyperglycemia, unspecified: Principal | ICD-10-CM

## 2018-12-16 DIAGNOSIS — Z5181 Encounter for therapeutic drug level monitoring: Principal | ICD-10-CM

## 2018-12-16 DIAGNOSIS — C220 Liver cell carcinoma: Principal | ICD-10-CM

## 2018-12-16 DIAGNOSIS — E559 Vitamin D deficiency, unspecified: Principal | ICD-10-CM

## 2018-12-16 DIAGNOSIS — Z944 Liver transplant status: Principal | ICD-10-CM

## 2018-12-16 DIAGNOSIS — E612 Magnesium deficiency: Principal | ICD-10-CM

## 2018-12-16 DIAGNOSIS — E789 Disorder of lipoprotein metabolism, unspecified: Principal | ICD-10-CM

## 2018-12-16 LAB — BILIRUBIN TOTAL: Bilirubin:MCnc:Pt:Ser/Plas:Qn:: 0.2

## 2018-12-16 LAB — COMPREHENSIVE METABOLIC PANEL
A/G RATIO: 1.9 (ref 1.2–2.2)
ALBUMIN: 4.5 g/dL (ref 3.8–4.9)
ALKALINE PHOSPHATASE: 124 IU/L — ABNORMAL HIGH (ref 39–117)
ALT (SGPT): 23 IU/L (ref 0–44)
AST (SGOT): 17 IU/L (ref 0–40)
BLOOD UREA NITROGEN: 19 mg/dL (ref 6–24)
BUN / CREAT RATIO: 19 (ref 9–20)
CALCIUM: 9.4 mg/dL (ref 8.7–10.2)
CHLORIDE: 107 mmol/L — ABNORMAL HIGH (ref 96–106)
CO2: 20 mmol/L (ref 20–29)
CREATININE: 0.98 mg/dL (ref 0.76–1.27)
GLUCOSE: 117 mg/dL — ABNORMAL HIGH (ref 65–99)
SODIUM: 141 mmol/L (ref 134–144)
TOTAL PROTEIN: 6.9 g/dL (ref 6.0–8.5)

## 2018-12-16 LAB — CBC W/ DIFFERENTIAL
BANDED NEUTROPHILS ABSOLUTE COUNT: 0.1 10*3/uL (ref 0.0–0.1)
BASOPHILS ABSOLUTE COUNT: 0.1 10*3/uL (ref 0.0–0.2)
EOSINOPHILS ABSOLUTE COUNT: 0.7 10*3/uL — ABNORMAL HIGH (ref 0.0–0.4)
EOSINOPHILS RELATIVE PERCENT: 7 %
HEMATOCRIT: 40.6 % (ref 37.5–51.0)
HEMOGLOBIN: 13.4 g/dL (ref 13.0–17.7)
IMMATURE GRANULOCYTES: 1 %
LYMPHOCYTES ABSOLUTE COUNT: 1.9 10*3/uL (ref 0.7–3.1)
LYMPHOCYTES RELATIVE PERCENT: 19 %
MEAN CORPUSCULAR HEMOGLOBIN: 27.8 pg (ref 26.6–33.0)
MEAN CORPUSCULAR VOLUME: 84 fL (ref 79–97)
MONOCYTES ABSOLUTE COUNT: 0.7 10*3/uL (ref 0.1–0.9)
MONOCYTES RELATIVE PERCENT: 7 %
NEUTROPHILS ABSOLUTE COUNT: 6.5 10*3/uL (ref 1.4–7.0)
NEUTROPHILS RELATIVE PERCENT: 65 %
PLATELET COUNT: 310 10*3/uL (ref 150–450)
RED BLOOD CELL COUNT: 4.82 x10E6/uL (ref 4.14–5.80)
WHITE BLOOD CELL COUNT: 10 10*3/uL (ref 3.4–10.8)

## 2018-12-16 LAB — BILIRUBIN DIRECT: Bilirubin.glucuronidated+Bilirubin.albumin bound:MCnc:Pt:Ser/Plas:Qn:: 0.09

## 2018-12-16 LAB — PHOSPHORUS, SERUM: Phosphate:MCnc:Pt:Ser/Plas:Qn:: 3.6

## 2018-12-16 LAB — RED CELL DISTRIBUTION WIDTH: Erythrocyte distribution width:Ratio:Pt:RBC:Qn:Automated count: 13.6

## 2018-12-16 LAB — MAGNESIUM: Magnesium:MCnc:Pt:Ser/Plas:Qn:: 1.6

## 2018-12-16 LAB — GAMMA GLUTAMYL TRANSFERASE: Gamma glutamyl transferase:CCnc:Pt:Ser/Plas:Qn:: 42

## 2018-12-16 NOTE — Unmapped (Signed)
Patient with upcoming 3 mos liver txp f/u on Monday, which primary coordinator will not be able to attend. Contacted patient who reports he continues to have back pain and c/o incisional discomfort, which he describes as tingling and sensitivity when clothing touches it. Explained that he may be experiencing  Nerve pain as they may be regenerating from the trauma of surgery. He denied redness or drainage from area, and abdominal/inguinal hernia pain. He said his back continues to hurt, for which he has been using muscle relaxants to help him sleep. He has a f/u appt with pain clnic in early December. Anthony Alvarado denies n/v/d/constipation and SOB, but endorses some mild discomfort with deep breaths. He believes he is having some mild swelling in his abdomen just above his navel. His weight,however,is reported as stable. He is taking his IS meds regularly and drinking 2-3 liters of water daily. Discussed necessity of having KUB on Monday to check for presence of surgical stent, and that he should be NPO for labs at beginning of appt to check his lipid levels. Explained that he should receive a post txp edu booklet on Monday, which would be discussed indepth with primary coordinator over the phone after his appt.

## 2018-12-17 LAB — TACROLIMUS BLOOD: Tacrolimus:MCnc:Pt:Bld:Qn:LC/MS/MS: 9.7

## 2018-12-19 ENCOUNTER — Encounter: Admit: 2018-12-19 | Discharge: 2018-12-20 | Payer: MEDICARE

## 2018-12-19 ENCOUNTER — Ambulatory Visit: Admit: 2018-12-19 | Discharge: 2018-12-20 | Payer: MEDICARE

## 2018-12-19 ENCOUNTER — Telehealth: Admit: 2018-12-19 | Discharge: 2018-12-20 | Payer: MEDICARE | Attending: Nutritionist | Primary: Nutritionist

## 2018-12-19 DIAGNOSIS — Z944 Liver transplant status: Principal | ICD-10-CM

## 2018-12-19 DIAGNOSIS — Z4689 Encounter for fitting and adjustment of other specified devices: Principal | ICD-10-CM

## 2018-12-19 DIAGNOSIS — E039 Hypothyroidism, unspecified: Principal | ICD-10-CM

## 2018-12-19 DIAGNOSIS — E612 Magnesium deficiency: Principal | ICD-10-CM

## 2018-12-19 DIAGNOSIS — Z79899 Other long term (current) drug therapy: Principal | ICD-10-CM

## 2018-12-19 DIAGNOSIS — R109 Unspecified abdominal pain: Principal | ICD-10-CM

## 2018-12-19 DIAGNOSIS — Z9689 Presence of other specified functional implants: Secondary | ICD-10-CM | POA: Diagnosis not present

## 2018-12-19 DIAGNOSIS — Z4823 Encounter for aftercare following liver transplant: Secondary | ICD-10-CM | POA: Diagnosis not present

## 2018-12-19 LAB — LIPID PANEL
CHOLESTEROL: 114 mg/dL (ref 100–199)
LDL CHOLESTEROL CALCULATED: 32 mg/dL — ABNORMAL LOW (ref 60–99)
NON-HDL CHOLESTEROL: 81 mg/dL
TRIGLYCERIDES: 243 mg/dL — ABNORMAL HIGH (ref 1–149)
VLDL CHOLESTEROL CAL: 48.6 mg/dL — ABNORMAL HIGH (ref 12–47)

## 2018-12-19 LAB — CBC W/ AUTO DIFF
BASOPHILS ABSOLUTE COUNT: 0.1 10*9/L (ref 0.0–0.1)
BASOPHILS RELATIVE PERCENT: 1 %
EOSINOPHILS ABSOLUTE COUNT: 0.8 10*9/L — ABNORMAL HIGH (ref 0.0–0.4)
EOSINOPHILS RELATIVE PERCENT: 5.4 %
HEMATOCRIT: 45.7 % (ref 41.0–53.0)
HEMOGLOBIN: 14.6 g/dL (ref 13.5–17.5)
LARGE UNSTAINED CELLS: 1 % (ref 0–4)
LYMPHOCYTES ABSOLUTE COUNT: 1.9 10*9/L (ref 1.5–5.0)
LYMPHOCYTES RELATIVE PERCENT: 13.8 %
MEAN CORPUSCULAR HEMOGLOBIN CONC: 31.9 g/dL (ref 31.0–37.0)
MEAN CORPUSCULAR HEMOGLOBIN: 26.9 pg (ref 26.0–34.0)
MEAN CORPUSCULAR VOLUME: 84.4 fL (ref 80.0–100.0)
MEAN PLATELET VOLUME: 7.6 fL (ref 7.0–10.0)
MONOCYTES ABSOLUTE COUNT: 0.6 10*9/L (ref 0.2–0.8)
MONOCYTES RELATIVE PERCENT: 4.7 %
NEUTROPHILS RELATIVE PERCENT: 74.1 %
PLATELET COUNT: 330 10*9/L (ref 150–440)
RED BLOOD CELL COUNT: 5.42 10*12/L (ref 4.50–5.90)
RED CELL DISTRIBUTION WIDTH: 14.4 % (ref 12.0–15.0)
WBC ADJUSTED: 13.7 10*9/L — ABNORMAL HIGH (ref 4.5–11.0)

## 2018-12-19 LAB — COMPREHENSIVE METABOLIC PANEL
ALBUMIN: 4.8 g/dL (ref 3.5–5.0)
ALKALINE PHOSPHATASE: 114 U/L (ref 38–126)
ALT (SGPT): 24 U/L (ref <50)
ANION GAP: 13 mmol/L (ref 7–15)
AST (SGOT): 23 U/L (ref 19–55)
BILIRUBIN TOTAL: 0.4 mg/dL (ref 0.0–1.2)
BLOOD UREA NITROGEN: 21 mg/dL (ref 7–21)
BUN / CREAT RATIO: 22
CALCIUM: 9.9 mg/dL (ref 8.5–10.2)
CHLORIDE: 103 mmol/L (ref 98–107)
CO2: 25 mmol/L (ref 22.0–30.0)
CREATININE: 0.97 mg/dL (ref 0.70–1.30)
EGFR CKD-EPI AA MALE: >90 mL/min/{1.73_m2} (ref >=60)
GLUCOSE RANDOM: 108 mg/dL — ABNORMAL HIGH (ref 70–99)
POTASSIUM: 4.8 mmol/L (ref 3.5–5.0)
PROTEIN TOTAL: 7.7 g/dL (ref 6.5–8.3)
SODIUM: 141 mmol/L (ref 135–145)

## 2018-12-19 LAB — MAGNESIUM: Magnesium:MCnc:Pt:Ser/Plas:Qn:: 1.4 — ABNORMAL LOW

## 2018-12-19 LAB — BILIRUBIN DIRECT: Bilirubin.glucuronidated+Bilirubin.albumin bound:MCnc:Pt:Ser/Plas:Qn:: <0.1

## 2018-12-19 LAB — TACROLIMUS, TROUGH: Lab: 9.4

## 2018-12-19 LAB — PHOSPHORUS: Phosphate:MCnc:Pt:Ser/Plas:Qn:: 3.9

## 2018-12-19 LAB — THYROID STIMULATING HORMONE: Thyrotropin:ACnc:Pt:Ser/Plas:Qn:: 1.41

## 2018-12-19 LAB — LYMPHOCYTES RELATIVE PERCENT: Lymphocytes/100 leukocytes:NFr:Pt:Bld:Qn:Automated count: 13.8

## 2018-12-19 LAB — HDL CHOLESTEROL: Cholesterol.in HDL:MCnc:Pt:Ser/Plas:Qn:: 33 — ABNORMAL LOW

## 2018-12-19 LAB — AFP-TUMOR MARKER: Alpha-1-Fetoprotein.tumor marker:MCnc:Pt:Ser/Plas:Qn:: 1

## 2018-12-19 LAB — ESTIMATED AVERAGE GLUCOSE: Estimated average glucose:MCnc:Pt:Bld:Qn:Estimated from glycated hemoglobin: 123

## 2018-12-19 LAB — FREE T4: Thyroxine.free:MCnc:Pt:Ser/Plas:Qn:: 1.2

## 2018-12-19 LAB — GAMMA GLUTAMYL TRANSFERASE: Gamma glutamyl transferase:CCnc:Pt:Ser/Plas:Qn:: 54

## 2018-12-19 LAB — ALKALINE PHOSPHATASE: Alkaline phosphatase:CCnc:Pt:Ser/Plas:Qn:: 114

## 2018-12-19 LAB — HEMOGLOBIN A1C: HEMOGLOBIN A1C: 5.9 % — ABNORMAL HIGH (ref 4.8–5.6)

## 2018-12-19 MED ORDER — GABAPENTIN 300 MG CAPSULE
ORAL_CAPSULE | Freq: Two times a day (BID) | ORAL | 5 refills | 30.00000 days | Status: CP
Start: 2018-12-19 — End: 2019-12-19
  Filled 2019-01-13: qty 60, 30d supply, fill #0

## 2018-12-19 MED ORDER — ALENDRONATE 70 MG TABLET
ORAL_TABLET | ORAL | 11 refills | 28.00000 days | Status: CP
Start: 2018-12-19 — End: 2019-12-19
  Filled 2018-12-20: qty 4, 28d supply, fill #0

## 2018-12-19 NOTE — Unmapped (Signed)
FOLLOW UP CLINIC NOTE       Date of Service: 12/19/2018    Current complaint: 3 month follow up for liver transplant      Assessment and Plan:   Anthony Alvarado is a 58 y.o. male who underwent OLT on cryptogenic cirrhosis (subsequently found to have A1AT on biopsy) c/b HE and HCC s/p RFA on 05/03/2018 now s/p OLT 09/16/2018. Post-operative course was relatively uncomplicated and he was last seen in clinic on 09/26/18 with removal of one JP drain. Patient seen in clinic on 8/17 at which point he was started on lasix for volume overload. A few days after patient was admitted with fevers with CXR concerning for early pneumonia s/p treatment with levaquin. Abdominal drain was removed on 8/20. Patient continues to have issues with abdominal and back pain.    # Immunosuppression:   - Tacrolimus 2.5 mg BID with goal trough 8-10 ng/mL; level pending today  - Myfortic 360 mg BID     # Prophylaxis:   - PJP: Bactrim SS MWF x 6 months (end date 03/19/19)    # Back and abdominal pain: Unclear etiology but may be related to cutaneous nerve damage or related to pleural effusion. Extensive w/u negative (see below). Will refer to interventional pulmonology for evaluation of pleural effusion. If no improvement in future, consider PET/CT.  - Gabapentin 300 mg BID  - EGD for removal of biliary stent; will evaluate for intra-luminal causes of abdominal pain at that time  - Referral to interventional pulmonology for evaluation of pleural effusion  - Establish in chronic pain clinic  - If worsening pain in future, consideration of PET/CT to evaluate for extra-hepatic HCC    # Osteoporosis:  - Continue on vitamin D and calcium  - Initiate alendronate 10 mg daily     # Miscellaneous  - Continue ASA 81 mg daily  - Continue lantus 5 u qHS and encouraged monitoring sugars prior to meals  - MRI and AFP q3 month x 1 year, then q6 months per [facility] protocol      History of Present Illness:   Anthony Alvarado is a 58 y.o. male with cryptogenic cirrhosis (subsequently found to have A1AT on biopsy) c/b HE and HCC s/p RFA on 05/03/2018 now s/p OLT 09/16/2018. On explant pathology, patient was found to have viable cancer found within segment 6 but without vascular, lymph, or bile duct invasion. There was also a high grade dysplastic nodule with cirrhotic changes in segment 5. Prior review showed PAS-positive globules potentially consistently with A1AT but PI typing showed PiMM. Post-operative course was relatively uncomplicated and he was last seen in clinic on 09/26/18 with removal of one JP drain. Patient seen in clinic on 8/17 at which point he was started on lasix for volume overload. A few days after patient was admitted with fevers with CXR concerning for early pneumonia s/p treatment with levaquin. Abdominal drain was removed on 8/20.    He again had a hospitalization from 10/6-10/8 for chest pain with negative ECG and CTA. Tpn was 0.039 and downtrended. MRI obtained for [facility] surveillance which showed stable posterior hepatic fluid collection (post-op hematoma unchanged in size) and loculated ascites along the falciform ligament, no enhancement to suggest abscess. VIR performed percutaneous aspiration of perihepatic fluid collection on 10/8. Patient also had new onset back pain with negative plain films - he was seen by ortho who recommended PT as an outpatient.    He states that about 2 weeks ago he developed  pleuritic chest pain that lasted for a day without any associated shortness of breath. He took a zanaflex, which helped him get to sleep. He continues to have daily abdominal pain that feels sore (occasionally sharp) throughout the upper abdomen with radiation to his flanks without any clear exacerbating or alleviating factors (except maybe a hot bath). Endorses puffiness in abdomen. No associated fevers, nausea, vomiting, diarrhea, constipation. Rare heartburn like symptoms. Patient also has thoracic back pain that he feels when laying back without any improvement or worsening with movement. This is associated with tingling in the back. Denies associated LE weakness or numbness. No improvement with tylenol.    Denies fevers, worsening jaundice, dysphagia/odynaphagia, nausea, diarrhea/constipation, LE swelling, parathesias. Notes occasional headaches and daily tremor that can occur anytime.    Immunosuppression: tacrolimus 2.5 mg BID and myfortic 360 mg BID  Ppx: valcyte 450 mg daily and bactrim 3x/week    Labs:  WBC 10, Hgb 13.4, PLT 301  Na 141, K 4.9, Cr 0.98  Albumin 4.5, Tbili 0.2, AST 17, ALT 23, ALP 124    Tacrolimus levels (7.9-9.7)     Physical Exam:  BP 121/99  - Pulse 67  - Temp 36.9 ??C (Tympanic)  - Ht 172.7 cm (5' 8)  - Wt 80.5 kg (177 lb 6.4 oz)  - BMI 26.97 kg/m??   General Appearance:  No acute distress, well appearing and well nourished.   Head:  Normocephalic, atraumatic.   Eyes:  No scleral icterus.   Pulmonary:    Normal respiratory effort.   Cardiovascular:  Regular rate and rhythm.   Abdomen:   Abdominal scar well healed without hernia. Mild TTP with negative Carnett   Neurologic: Non-focal exam. No point tenderness along spine.         Lab Results   Component Value Date    WBC 13.7 (H) 12/19/2018    HGB 14.6 12/19/2018    HCT 45.7 12/19/2018    PLT 330 12/19/2018     Lab Results   Component Value Date    NA 141 12/19/2018    K 4.8 12/19/2018    CL 103 12/19/2018    CO2 25.0 12/19/2018    BUN 21 12/19/2018    CREATININE 0.97 12/19/2018    CALCIUM 9.9 12/19/2018    MG 1.4 (L) 12/19/2018    PHOS 3.9 12/19/2018      Lab Results   Component Value Date    ALKPHOS 114 12/19/2018    BILITOT 0.4 12/19/2018    BILIDIR <0.10 12/19/2018    PROT 7.7 12/19/2018    ALBUMIN 4.8 12/19/2018    ALT 24 12/19/2018    AST 23 12/19/2018    GGT 54 12/19/2018      Lab Results   Component Value Date    APTT 29.6 10/12/2018          IMAGING:  Xr Abdomen 1 View    Result Date: 12/19/2018  EXAM: XR ABDOMEN 1 VIEW DATE: 12/19/2018 11:27 AM ACCESSION: [id number] UN DICTATED: 12/19/2018 11:34 AM INTERPRETATION LOCATION: [facility] CLINICAL INDICATION: 58 years old Male with Eval bili stent ; OTHER  - Z94.4 - Liver replaced by transplant (CMS - HCC)  COMPARISON: CT abdomen/pelvis 11/25/2018 TECHNIQUE: Supine view of the abdomen. FINDINGS: Biliary stent with proximal portion projecting over the expected location of the porta hepatis and distal portion projecting over the approximate location of the proximal jejunum. Additional surgical clips and sequelae of liver transplant. Nonobstructive bowel gas pattern. Moderate colonic stool burden.  Visualized lungs and cardiomediastinal silhouette are unremarkable. No acute osseous pathology.     Stable positioning of the biliary stent compared to CT abdomen/pelvis 11/25/2018.

## 2018-12-19 NOTE — Unmapped (Signed)
[facility] PHARMACY NOTE  12/19/2018   Anthony Alvarado  [id number]    Medication changes today:   1. Start gabapentin 300mg  BID  2. Start alendronate 70mg  weekly  3. Decrease tacrolimus to 2mg  BID    Education/Adherence tools provided today:  1.provided updated medication list  2. provided additional pill box education  3.  provided additional education on immunosuppression and transplant related medications including reviewing indications of medications, dosing and side effects    Follow up items:  1. goal of understanding indications and dosing of immunosuppression medications  2. Start statin at next visit  3. Consider starting metformin or glipizide   4. Improvement in pain following gabapentin initiation   5. Vitamin D level pending    Next visit with pharmacy in 1 month  ____________________________________________________________________    Anthony Alvarado is a 58 y.o. male s/p orthotopic liver transplant on 09/16/2018 (Liver) 2/2 cryptogenic cirrhosis w/HCC.    Other PMH significant for HTN, T2DM, OSA, aortic stenosis    Seen by pharmacy today for: medication management and pill box fill and adherence education; last seen by pharmacy 2 months ago     CC:  Patient complains of abdominal and back pain.    Vitals:    12/19/18 0923   BP: 121/99   Pulse: 67   Temp: 36.9 ??C (98.4 ??F)       Allergies   Allergen Reactions   ??? Watermelon Flavor      Mouth itching       All medications reviewed and updated.     Medication list includes revisions made during today???s encounter    Outpatient Encounter Medications as of 12/19/2018   Medication Sig Dispense Refill   ??? acetaminophen (TYLENOL) 325 MG tablet Take 2 tablets (650 mg total) by mouth every six (6) hours as needed for pain or fever (> 38C or 100.49F). 100 tablet 0   ??? alendronate (FOSAMAX) 70 MG tablet Take 1 tablet (70 mg total) by mouth every seven (7) days. 4 tablet 11   ??? amLODIPine (NORVASC) 5 MG tablet Take 2 tablets (10 mg total) by mouth daily. 30 tablet 11   ??? aspirin (ECOTRIN) 81 MG tablet Take 1 tablet (81 mg total) by mouth daily. 30 tablet 11   ??? azelastine (ASTELIN) 137 mcg (0.1 %) nasal spray 1 spray by Each Nare route daily as needed.      ??? blood sugar diagnostic Strp Use as directed Three (3) times a day before meals. 90 each 11   ??? calcium carbonate (TUMS) 200 mg calcium (500 mg) chewable tablet Chew 1 tablet daily as needed for heartburn.     ??? ergocalciferol (DRISDOL) 1,250 mcg (50,000 unit) capsule Take 1 capsule (50,000 Units total) by mouth once a week for 4 doses. 4 capsule 0   ??? EUCRISA 2 % Oint Apply 1 application topically daily as needed.      ??? fluticasone propionate (FLONASE) 50 mcg/actuation nasal spray PLACE ONE OR TWO SPRAYS INTO BOTH NOSTRILS DAILY AS NEEDED.     ??? gabapentin (NEURONTIN) 300 MG capsule Take 1 capsule (300 mg total) by mouth two (2) times a day. 60 capsule 5   ??? lancets 33 gauge Misc 1 each by Miscellaneous route Three (3) times a day before meals. 100 each 11   ??? levothyroxine (SYNTHROID) 150 MCG tablet Take 1 tablet (150 mcg total) by mouth daily. 30 tablet 11   ??? magnesium oxide-Mg AA chelate (MAGNESIUM, AMINO ACID CHELATE,)  133 mg Tab Take 1 tablet by mouth Two (2) times a day. HOLD until directed to start by your coordinator. 60 tablet 11   ??? metroNIDAZOLE (METROGEL) 1 % gel Apply 1 application topically daily as needed.      ??? mycophenolate (MYFORTIC) 180 MG EC tablet Take 2 tablets (360 mg total) by mouth Two (2) times a day. 120 tablet 11   ??? oxyCODONE (ROXICODONE) 5 MG immediate release tablet Take 1-2 tablets (5-10 mg total) by mouth every six (6) hours as needed for pain. 40 tablet 0   ??? sertraline (ZOLOFT) 25 MG tablet Take 1 tablet (25 mg total) by mouth daily. 30 tablet 11   ??? sulfamethoxazole-trimethoprim (BACTRIM) 400-80 mg per tablet Take 1 tablet (80 mg of trimethoprim total) by mouth 3 (three) times a week. 12 tablet 5   ??? tacrolimus (PROGRAF) 0.5 MG capsule Take 5 capsules (2.5 mg total) by mouth two (2) times a day. (Patient was taking 3 mg daily on 11/21/2018) started 2.5 mg BID 11/21/2018 for PM dose 300 capsule 11   ??? tacrolimus (PROGRAF) 0.5 MG capsule Take 5 capsules (2.5 mg total) by mouth two (2) times a day. 300 capsule 11   ??? TiZANidine (ZANAFLEX) 2 MG capsule Take 2 mg by mouth Three (3) times a day as needed for muscle spasms.     ??? [DISCONTINUED] valGANciclovir (VALCYTE) 450 mg tablet Take 1 tablet (450 mg total) by mouth daily. 30 tablet 2     Facility-Administered Encounter Medications as of 12/19/2018   Medication Dose Route Frequency Provider Last Rate Last Dose   ??? diazePAM (VALIUM) 5 mg/mL injection                Induction agent : basiliximab    CURRENT IMMUNOSUPPRESSION: tacrolimus 2.5 mg PO bid  prograf/Envarsus/cyclosporine goal: 8-10   myfortic360  mg PO bid    steroid free     Patient complains of temperature sensitivity and mild tremor.    IMMUNOSUPPRESSION DRUG LEVELS:  Lab Results   Component Value Date    Tacrolimus, Trough 9.4 12/19/2018    Tacrolimus, Trough 10.3 12/01/2018    Tacrolimus, Trough 9.5 11/30/2018    Tacrolimus Lvl 9.7 12/15/2018    Tacrolimus Lvl 8.1 12/12/2018    Tacrolimus Lvl 7.6 12/08/2018     No results found for: CYCLO  No results found for: EVEROLIMUS  No results found for: SIROLIMUS    Prograf level is accurate 12 hour trough    Graft function: stable  DSA: ntd   Explant biopsy: well differentiated HCC with no vascular invasion, focal high grade dysplastic nodule with small cell change in segment V  Biopsies to date: ntd  WBC/ANC:  elevated (13.7/10.1)    Plan: Will decrease  tacrolimus to 2mg  BID. Adjust tacrolimus goal to 6-8.Continue to monitor.    OI Prophylaxis:   CMV Status: D-/ R+, moderate risk. CMV prophylaxis: valganciclovir 450 mg daily x 3 months (complete)  No results found for: CMVCP  PCP Prophylaxis: bactrim SS 1 tab MWF x 6 months.   Thrush:  completed in hospital  Patient is  tolerating infectious prophylaxis well    Plan: Valcyte prophylaxis completed. Continue to monitor.    CV Prophylaxis: asa 81 mg   The 10-year ASCVD risk score Denman George DC Jr., et al., 2013) is: 9.4%  Statin therapy: Indicated; currently on no statin  Plan: consider starting statin at a later date. Continue to monitor     BP: Goal <  140/90. Clinic vitals reported above  Home BP ranges: 130/80s  Date AM BP PM BP   12/14/18 133/88 --   12/15/18 136/88 --   12/16/18 138/96 --   12/17/18 127/88 --   12/18/18 126/86 --     Current meds include: none  Plan: within goal. Continue to monitor    Anemia:  H/H:   Lab Results   Component Value Date    HGB 14.6 12/19/2018     Lab Results   Component Value Date    HCT 45.7 12/19/2018     Iron panel:  Lab Results   Component Value Date    IRON 157 01/04/2018    TIBC 305.9 01/04/2018    FERRITIN 116.0 01/04/2018     Lab Results   Component Value Date    Iron Saturation (%) 51 (H) 01/04/2018       Prior ESA use: none post transplant    Plan: stable, within goal. Continue to monitor.     DM:   Lab Results   Component Value Date    A1C 5.9 (H) 12/19/2018   . Goal A1c < 7  History of Dm? Yes: T2DM  Established with endocrinologist/PCP for BG managment? Yes: PCP  Currently on: N/A  Home BS log:   Breakfast Lunch  Dinner  HS   Date AC PC [facility] PC [facility]    12/15/2018 153         12/16/2018 125         12/17/2018 117         12/18/2018 134         Diet:did not address  Exercise: started working on cars  Hypoglycemia: no  Plan:  Consider glipizide XL 5 mg daily or metformin if needs DM treatment. If at any point would consider changing agents, Januvia is preferred DPP4 through insurance.     Electrolytes: wnl  Meds currently on: none  Plan: Continue to monitor     GI/BM: Pt reports 1-2BM daily   Meds currently on:  pantoprazole 40 mg daily PRN, TUMS (tries to take 1-2 times per day)  Plan: Continue to monitor     Pain: pt reports significant pain; abdominal pain is below ribcage and sounds neuropathic in nature while mid-back pain may be MSK  Meds currently on: APAP (takes 1-2x/ week, does not help pain), oxycodone 5 mg HS (does not take because he tries saving them for very severe pain), tizanidine 2 mg PRN  Plan: Start gabapentin 300mg  BID.  Continue to monitor    Bone health:   Vitamin D Level: 8.5 on 09/26/18. Goal > 30.   Last DEXA results: osteoporosis of spine 01/04/18 - Spine T score - 2.6, femur T score -1.3  Current meds include: Ergocalciferol 50,000u weekly x8 weeks. TUMS 500mg  3 tabs BID (pt tries to take 1-2 times per day but often forgets)  Plan: Vitamin D level  pending,take TUMS 1500 mg BID and start alendronate 70 mg weekly. Continue to monitor.     Women's/Men's Health:  Anthony Alvarado is a 58 y.o. male. Patient reports no men's/women's health issues  Plan: Continue to monitor    Hypothyroidism  Meds currently on: levothyroxine 150 mcg  TSH 1.4, FT4 1.2 on 12/19/18  Plan: Continue to monitor    Anxiety/depression  Meds currently on: sertraline 25 mg daily  Plan: Continue to monitor    Allergies  Meds currently on: azelastin PRN, Flonase PRN    Rosacea  Meds currently  on: metrogel PRN (not using as improved with prednisone), Eucrisa ointment PRN itching  Plan: Continue to monitor    Adherence: Patient has good understanding of medications; was able to independently identify names/doses of immunosuppressants and OI meds.  Patient  does fill their own pill box on a regular basis at home.  Patient brought medication card:yes  Pill box:was correct  Plan: Provided extensive adherence counseling/intervention    Spent approximately 20 minutes on educating this patient and greater than 50% was spent in direct face to face counseling regarding post transplant medication education. Questions and concerns were address to patient's satisfaction.    Patient was reviewed with Dr.Desai who was agreement with the stated plan:     During this visit, the following was completed:   BG log data assessment  BP log data assessment  Labs ordered and evaluated  complex treatment plan >1 DS   Patient education was completed for 11-24 minutes     All questions/concerns were addressed to the patient's satisfaction.  __________________________________________  [place], PharmD Candidate    Cleone Slim, PHARMD, CPP  [employer] TR[facility]PL[facility] CLINICAL PHARMACIST PRACTITIONER  PAGER [phone]

## 2018-12-19 NOTE — Unmapped (Addendum)
Patient had 3 mos f/u liver txp visit today. Since MRI was completed during recent hospitalization, only KUB done today. Patient with surgical stent still present. ERCP ordered.  Messaged txp pharmacist re.patient's elevated tac level from today.

## 2018-12-19 NOTE — Unmapped (Signed)
Outpatient Adult Nutrition-Transplant Follow Up    Referring Provider: [name]    Reason for Referral: s/p liver transplant  09/16/2018    PMH:   Patient Active Problem List   Diagnosis   ??? Diabetes mellitus without complication (CMS-HCC)   ??? Hypothyroidism   ??? Cirrhosis of liver without ascites (CMS-HCC)   ??? OSA (obstructive sleep apnea)   ??? Essential hypertension   ??? Encounter for pre-transplant evaluation for chronic liver disease   ??? Pleural effusion   ??? Pneumothorax after biopsy   ??? Liver transplant recipient (CMS-HCC)   ??? Allergic rhinitis   ??? Arthralgia   ??? Asthma   ??? Bicuspid aortic valve   ??? Flying phobia   ??? Mucosal abnormality of stomach   ??? Nephrolithiasis   ??? Pure hypercholesterolemia   ??? Umbilical hernia   ??? Unspecified hearing loss   cirrhosis c/b HE, possible HCC    Anthropometrics:   Estimated body mass index is 26.97 kg/m?? as calculated from the following:    Height as of an earlier encounter on 12/19/18: 172.7 cm (5' 8).    Weight as of an earlier encounter on 12/19/18: 80.5 kg (177 lb 6.4 oz).  IBW: 72.6 kg  %IBW: 110%   Goal: maintenance    Weight History: Pt reports weighing ~175- 176 lbs for the past month   Wt Readings from Last 10 Encounters:   12/19/18 80.5 kg (177 lb 6.4 oz)   11/29/18 79 kg (174 lb 2.6 oz)   10/24/18 80.3 kg (177 lb 1.6 oz)   10/17/18 77.7 kg (171 lb 6.4 oz)   10/13/18 77.4 kg (170 lb 9.6 oz)   10/10/18 79.8 kg (176 lb)   10/10/18 79.8 kg (176 lb)   09/26/18 79.4 kg (175 lb)   09/26/18 79.4 kg (175 lb)   09/22/18 80.2 kg (176 lb 12.9 oz)     Nutrition-Focused Physical Findings:   Visit completed via phone/video and therefore physical assessment unable to be completed.    Relevant Medications, Herbs, Supplements include:  Tums, ergocalciferol, magnesium oxide, levothyroxine    Relevant Labs: ANC= 4.8- 6.5    Pt reports that his BG's have been ~100- 130 at home on average; no more anti diabetic medications- was taken off Glipizide x1 month    Lab Results Component Value Date    A1C 4.7 (L) 10/24/2018     Physical Activity: minimal, as he still has pain. He tries to walk up steps and notices his muscles are stronger but has joint pain so has low endurance.     Dietary Restrictions, Intolerances: food safety precautions, has been eating a high protein diet      Allergies:   Allergies   Allergen Reactions   ??? Watermelon Flavor      Mouth itching     Hunger and Satiety: good appetite per pt, nothing bothers him     24-Hour recall/usual intake:   1st - bran flakes w/ scrambled eggs  Snack - none  2nd - tomato soup or chicken broth w/ pinto/lima beans, pizza or spaghetti once a week   Snack - none  3rd - grilled cheese sandwich  Snack - none  Beverages - Ensure Max Protein a few times per week, 6 bottles of water per day     Other Usual Intake: fish sandwiches on Saturdays, tacos once a week, peanut butter crackers    Nutrition History: Pt denies n/v/d/c. PO intake has improved and he reports gaining ~3-4  lbs since transplant. He has been following the food safety precautions closely, and he has been cooking all of his proteins to appropriate temperatures. He has continues to eat more vegetables and plant based proteins.      Estimated Needs:   Estimated Energy Needs: 6387-5643 kcal/day (25-30 kcal/kg estimated dry weight)  Estimated Protein Needs:  95-119 g protein/day (1.2-1.5 g/kg estimated dry weight)  Estimated CHO Needs: <45% total kcal  Estimated Fluid Needs:  per MD    Nutrition Assessment: PO intake appears to be adequate at this time. His appetite is much improved and he has successfully gained ~3-4 lbs since transplant. Pt was appreciative of food safety liberalization, but plans to continue being cognizant of how his food is prepared. Exercise continues to be limited d/t joint pain.    Malnutrition Assessment using AND/ASPEN Clinical Characteristics:  Unable to assess at this time as unable to perform updated NFPE    Nutrition Education: liberalizing food safety restrictions, general healthful diet     Nutrition Goals:   1. Meet estimated daily needs  2. Balanced macronutrient intake  3. Increase strength/muscle mass    Interventions:   1. Liberalize food safety restrictions   2. Can increase sodium intake to 2400 mg per day   3. Consume a balanced diet, can decrease focus on protein slightly   4. Increase physical activity  --- aim for walking and strength training for 150 minutes per week     Materials Provided were: RD contact info    Follow-up: at 6 month post transplant    I am located on-site and the patient is located on-site for this visit.      I spent 11 minutes on the real-time audio and video with the patient. I spent an additional 18 minutes on pre- and post-visit activities.     The patient was physically located in [place] or a state in which I am permitted to provide care. The patient and/or parent/guardian understood that s/he may incur co-pays and cost sharing, and agreed to the telemedicine visit. The visit was reasonable and appropriate under the circumstances given the patient's presentation at the time.    The patient and/or parent/guardian has been advised of the potential risks and limitations of this mode of treatment (including, but not limited to, the absence of in-person examination) and has agreed to be treated using telemedicine. The patient's/patient's family's questions regarding telemedicine have been answered.     If the visit was completed in an ambulatory setting, the patient and/or parent/guardian has also been advised to contact their provider???s office for worsening conditions, and seek emergency medical treatment and/or call 911 if the patient deems either necessary.      Lanelle Bal, RD, LDN  Abdominal Transplant Dietitian   Pager: [phone]

## 2018-12-20 ENCOUNTER — Encounter: Admit: 2018-12-20 | Discharge: 2018-12-21 | Payer: MEDICARE

## 2018-12-20 DIAGNOSIS — Z944 Liver transplant status: Principal | ICD-10-CM

## 2018-12-20 DIAGNOSIS — Z79899 Other long term (current) drug therapy: Principal | ICD-10-CM

## 2018-12-20 DIAGNOSIS — E612 Magnesium deficiency: Principal | ICD-10-CM

## 2018-12-20 LAB — VITAMIN D 25 HYDROXY: VITAMIN D, TOTAL (25OH): 38.9 ng/mL (ref 20.0–80.0)

## 2018-12-20 LAB — VITAMIN D, TOTAL (25OH): Lab: 38.9

## 2018-12-20 MED ORDER — TACROLIMUS 0.5 MG CAPSULE
ORAL_CAPSULE | Freq: Two times a day (BID) | ORAL | 11 refills | 30.00000 days | Status: CP
Start: 2018-12-20 — End: ?
  Filled 2019-01-13: qty 180, 30d supply, fill #0

## 2018-12-20 MED ORDER — GABAPENTIN 300 MG CAPSULE
ORAL_CAPSULE | Freq: Two times a day (BID) | ORAL | 5 refills | 30.00000 days | Status: CP
Start: 2018-12-20 — End: 2019-12-20

## 2018-12-20 MED FILL — ALENDRONATE 70 MG TABLET: 28 days supply | Qty: 4 | Fill #0 | Status: AC

## 2018-12-20 NOTE — Unmapped (Signed)
**THIS PATIENT WAS NOT SEEN IN PERSON TO MINIMIZE POTENTIAL SPREAD OF COVID-19, PROTECT PATIENTS/PROVIDERS, AND REDUCE PPE UTILIZATION.**    I spent 30 minutes on the phone with the patient.     The patient was physically located in [place] or a state in which I am permitted to provide care. The patient and/or parent/guardian understood that s/he may incur co-pays and cost sharing, and agreed to the telemedicine visit. The visit was reasonable and appropriate under the circumstances given the patient's presentation at the time.    The patient and/or parent/guardian has been advised of the potential risks and limitations of this mode of treatment (including, but not limited to, the absence of in-person examination) and has agreed to be treated using telemedicine. The patient's/patient's family's questions regarding telemedicine have been answered.     If the visit was completed in an ambulatory setting, the patient and/or parent/guardian has also been advised to contact their provider???s office for worsening conditions, and seek emergency medical treatment and/or call 911 if the patient deems either necessary.        THREE MONTH POST-TRANSPLANT LIVER TRANSPLANT   SOCIAL WORK FOLLOW UP ASSESSMENT       Patient Name: Anthony Alvarado  Medical Record Number: [id number]  Date of Service: December 20, 2018  PRESENT:        BACKGROUND INFORMATION: [name] is a 58 y.o. Caucasian, male post liver transplant. Patient received a liver transplant on 09/16/2018 (Liver).     BEHAVIORAL OBSERVATIONS:   He was interviewed alone.     MENTAL STATUS EXAM:  Appearance: unable to assess due to phone/virtual assessment   Speech/Language: Normal rate, volume, tone, fluency  Mood: reports stability; acknowledged recovery process but states it is difficult to explain   Affect: Calm and Cooperative  Orientation: Oriented to person, place, time, and general circumstances  Concentration: Able to fully concentrate and attend Memory: Immediate, short-term, long-term, and recall grossly intact ;reports sometimes he feels more forgetful   Insight: Intact  Judgment: Intact      LIVING SITUATION:  Housing: patient lives with daughter/Brittany  part-time    Marital status:separated  Children/dependents: 1 children; step-daughter     Adherence:  Medication Adherence: Excellent  Medication Concerns: denied problems taking medications, concerns about side effects, affordability, problems obtaining medications, and difficulty remembering medications  Other Adherence: Excellent     Side Effects: sporadic headaches but reports managable; tremors   Insurance Coverage: MISC; INSURANCE COVERAGE: Commercial    Lifestyle Issues:  Physical activity:  Fair; tries to be active as possible   Nutrition/Appetite:  Good  Sleep: Good  Pain (0=no pain; 10=worst pain imaginable): 7/10; abdominal and back pain; addressed during consult on 10/26 appointment   Pain Medications:  use them as prescribed; Gabpentin    Substance Issues:  Nicotine/Tobacco: denies   Current alcohol use: denies   Illicit drug use: Denied  -If yes type: N/A  Ilicit substance abuse or misuse: Denied      Social Issues:  Support/Caregiving Issues: denies; sister and daughter remain actively involved   Social Stressors/Changes: denied   Transportation issues: denied; he reports that he is back to driving   Insurance Issues: denied   Source of Income: was able to get disability; considering employment in the future based on his recovery and overall progress     Adjustment Issues:  Feelings about transplant: 'life changing situation.... down hill pace to an uphill pace and everyone reports how well he is looking now.Marland KitchenMarland KitchenMarland Kitchen  working through pain and overall recovery process'   PTSD: Denied  What would you change about transplant? Denied       GAD-7 Score: 0       PHQ-9 Score: 0      Coping Style: Functional    ASSESSMENT: Overall, [name] is doing very well at his 3 month post liver transplant check in. He reports some disappointment initially post transplant as his expectations of energy did not meet his expectations but he has acknowledged that it is a process. He states that many people including his PCP have complimented him on how much better he looks. He is thrilled to be alive and CSW acknowledged his patience with his recovery. He has intermittent pain and was prescribed gabapentin on 12/19/18 and already reported that he slept much better last night. Denies any major mood changes. Reports financial stability and is receiving SSDI payments. He is considering employment in the future.                RECOMMENDATIONS:   1. Annual follow up as needed     Carole Binning, [facility]  Clinical Social Worker  [facility]   Completed: 12/20/18

## 2018-12-20 NOTE — Unmapped (Signed)
Received msg from txp pharmacist re.patient's high 10/26 tac level to reduce his tac dose to 2mg  bid. Patient was seen for his 3 mos f/u by Dr.Moon who approved a weekly lab interval for him, going forward. Explained results of kub, which indicated stent presence and need for ercp for removal. Patient verbalized understanding and said he was already scheduled by GI procedures to complete this on 11/2. Provided education on post-txp care from green booklet given at appt yesterday. All questions asked/answered to patient's satisfaction.

## 2018-12-21 ENCOUNTER — Other Ambulatory Visit: Payer: Self-pay | Admitting: Family Medicine

## 2018-12-21 DIAGNOSIS — E039 Hypothyroidism, unspecified: Secondary | ICD-10-CM

## 2018-12-21 NOTE — Unmapped (Signed)
Change in Tacrolimus dosage decrease. Refill too soon until 01/01/2019. Will attempt test claim on 01/02/2019 date.

## 2018-12-22 DIAGNOSIS — Z79899 Other long term (current) drug therapy: Principal | ICD-10-CM

## 2018-12-22 DIAGNOSIS — Z944 Liver transplant status: Principal | ICD-10-CM

## 2018-12-22 DIAGNOSIS — E612 Magnesium deficiency: Principal | ICD-10-CM

## 2018-12-24 ENCOUNTER — Encounter
Admit: 2018-12-24 | Discharge: 2018-12-25 | Payer: MEDICARE | Attending: Critical Care Medicine | Primary: Critical Care Medicine

## 2018-12-24 DIAGNOSIS — Z01812 Encounter for preprocedural laboratory examination: Secondary | ICD-10-CM | POA: Diagnosis not present

## 2018-12-24 DIAGNOSIS — Z20828 Contact with and (suspected) exposure to other viral communicable diseases: Secondary | ICD-10-CM | POA: Diagnosis not present

## 2018-12-24 NOTE — Unmapped (Signed)
COVID Pre-Procedure Intake Form     Assessment     Anthony Alvarado is a 58 y.o. male presenting to [facility] for COVID testing.     Plan     If no testing performed, pt counseled on routine care for respiratory illness.  If testing performed, COVID sent.  Patient directed to Home given findings during today's visit.    Subjective     Anthony Alvarado is a 58 y.o. male who [facility] with complaints of the following:    Exposure History: In the last 21 days?     Have you traveled outside of [place]? No               Have you been in close contact with someone confirmed by a test to have COVID? (Close contact is within 6 feet for at least 10 minutes) No       Have you worked in a health care facility? No     Lived or worked facility like a nursing home, group home, or assisted living?    No         Are you scheduled to have surgery or a procedure in the next 3 days? Yes               Are you scheduled to receive cancer chemotherapy within the next 7 days?    No     Have you ever been tested before for COVID-19 with a swab of your nose? Yes: When: unknown, Where: [place]   Are you a healthcare worker being tested so to return to work No     Right now,  do you have any of the following that developed over the past 7 days (as stated by patient on intake form):    Subjective fever (felt feverish) No   Chills (especially repeated shaking chills) No   Severe fatigue (felt very tired) No   Muscle aches No   Runny nose No   Sore throat No   Loss of taste or smell No   Cough (new onset or worsening of chronic cough) No   Shortness of breath No   Nausea or vomiting No   Headache No   Abdominal Pain No   Diarrhea (3 or more loose stools in last 24 hours) No Scribe's Attestation: Arrionna Serena [employer], obtained and performed the history, physical exam and medical decision making elements that were entered into the chart.  Signed by Victorino Sparrow serving as Scribe, on 12/24/2018 10:56 AM    The documentation recorded by the scribe accurately reflects the service I personally performed and the decisions made by me. Martin Belling M. Corrado Hymon, AGNP  December 24, 2018 11:21 AM

## 2018-12-26 ENCOUNTER — Ambulatory Visit
Admit: 2018-12-26 | Discharge: 2018-12-28 | Disposition: A | Discharge status: Home / Self Care | Payer: MEDICARE | Admitting: Student in an Organized Health Care Education/Training Program

## 2018-12-26 ENCOUNTER — Encounter: Admit: 2018-12-26 | Discharge: 2018-12-26 | Payer: MEDICARE

## 2018-12-26 DIAGNOSIS — Z944 Liver transplant status: Principal | ICD-10-CM

## 2018-12-26 DIAGNOSIS — Z79899 Other long term (current) drug therapy: Principal | ICD-10-CM

## 2018-12-26 DIAGNOSIS — E612 Magnesium deficiency: Principal | ICD-10-CM

## 2018-12-26 DIAGNOSIS — Z4659 Encounter for fitting and adjustment of other gastrointestinal appliance and device: Secondary | ICD-10-CM | POA: Diagnosis not present

## 2018-12-26 DIAGNOSIS — E119 Type 2 diabetes mellitus without complications: Secondary | ICD-10-CM | POA: Diagnosis not present

## 2018-12-26 DIAGNOSIS — Z800 Family history of malignant neoplasm of digestive organs: Secondary | ICD-10-CM | POA: Diagnosis not present

## 2018-12-26 DIAGNOSIS — J9811 Atelectasis: Secondary | ICD-10-CM | POA: Diagnosis not present

## 2018-12-26 DIAGNOSIS — J45909 Unspecified asthma, uncomplicated: Secondary | ICD-10-CM | POA: Diagnosis not present

## 2018-12-26 DIAGNOSIS — Z794 Long term (current) use of insulin: Secondary | ICD-10-CM | POA: Diagnosis not present

## 2018-12-26 DIAGNOSIS — J939 Pneumothorax, unspecified: Secondary | ICD-10-CM | POA: Diagnosis not present

## 2018-12-26 DIAGNOSIS — Z48813 Encounter for surgical aftercare following surgery on the respiratory system: Secondary | ICD-10-CM | POA: Diagnosis not present

## 2018-12-26 DIAGNOSIS — I313 Pericardial effusion (noninflammatory): Secondary | ICD-10-CM | POA: Diagnosis not present

## 2018-12-26 DIAGNOSIS — Z7982 Long term (current) use of aspirin: Secondary | ICD-10-CM | POA: Diagnosis not present

## 2018-12-26 DIAGNOSIS — J449 Chronic obstructive pulmonary disease, unspecified: Secondary | ICD-10-CM | POA: Diagnosis not present

## 2018-12-26 DIAGNOSIS — T183XXA Foreign body in small intestine, initial encounter: Secondary | ICD-10-CM | POA: Diagnosis not present

## 2018-12-26 DIAGNOSIS — R918 Other nonspecific abnormal finding of lung field: Secondary | ICD-10-CM | POA: Diagnosis not present

## 2018-12-26 DIAGNOSIS — R0789 Other chest pain: Secondary | ICD-10-CM | POA: Diagnosis not present

## 2018-12-26 DIAGNOSIS — R0602 Shortness of breath: Secondary | ICD-10-CM | POA: Diagnosis not present

## 2018-12-26 DIAGNOSIS — E039 Hypothyroidism, unspecified: Secondary | ICD-10-CM | POA: Diagnosis not present

## 2018-12-26 DIAGNOSIS — K7469 Other cirrhosis of liver: Secondary | ICD-10-CM | POA: Diagnosis not present

## 2018-12-26 DIAGNOSIS — R188 Other ascites: Secondary | ICD-10-CM | POA: Diagnosis not present

## 2018-12-26 DIAGNOSIS — J90 Pleural effusion, not elsewhere classified: Secondary | ICD-10-CM | POA: Diagnosis not present

## 2018-12-26 DIAGNOSIS — M549 Dorsalgia, unspecified: Secondary | ICD-10-CM | POA: Diagnosis not present

## 2018-12-26 DIAGNOSIS — G4733 Obstructive sleep apnea (adult) (pediatric): Secondary | ICD-10-CM | POA: Diagnosis not present

## 2018-12-26 DIAGNOSIS — I350 Nonrheumatic aortic (valve) stenosis: Secondary | ICD-10-CM | POA: Diagnosis not present

## 2018-12-26 DIAGNOSIS — R1013 Epigastric pain: Secondary | ICD-10-CM | POA: Diagnosis not present

## 2018-12-26 DIAGNOSIS — K3189 Other diseases of stomach and duodenum: Secondary | ICD-10-CM | POA: Diagnosis not present

## 2018-12-26 DIAGNOSIS — Z8505 Personal history of malignant neoplasm of liver: Secondary | ICD-10-CM | POA: Diagnosis not present

## 2018-12-26 DIAGNOSIS — R1084 Generalized abdominal pain: Secondary | ICD-10-CM | POA: Diagnosis not present

## 2018-12-26 DIAGNOSIS — I10 Essential (primary) hypertension: Secondary | ICD-10-CM | POA: Diagnosis not present

## 2018-12-26 DIAGNOSIS — Z7983 Long term (current) use of bisphosphonates: Secondary | ICD-10-CM | POA: Diagnosis not present

## 2018-12-26 LAB — COMPREHENSIVE METABOLIC PANEL
ALBUMIN: 3.8 g/dL (ref 3.5–5.0)
ALT (SGPT): 20 U/L (ref <50)
ANION GAP: 6 mmol/L — ABNORMAL LOW (ref 7–15)
AST (SGOT): 22 U/L (ref 19–55)
BILIRUBIN TOTAL: 0.4 mg/dL (ref 0.0–1.2)
BLOOD UREA NITROGEN: 25 mg/dL — ABNORMAL HIGH (ref 7–21)
BUN / CREAT RATIO: 29
CALCIUM: 9.2 mg/dL (ref 8.5–10.2)
CHLORIDE: 107 mmol/L (ref 98–107)
CO2: 24 mmol/L (ref 22.0–30.0)
CREATININE: 0.87 mg/dL (ref 0.70–1.30)
EGFR CKD-EPI AA MALE: >90 mL/min/{1.73_m2} (ref >=60)
EGFR CKD-EPI NON-AA MALE: >90 mL/min/{1.73_m2} (ref >=60)
GLUCOSE RANDOM: 120 mg/dL (ref 70–179)
POTASSIUM: 4.6 mmol/L (ref 3.5–5.0)
PROTEIN TOTAL: 6.4 g/dL — ABNORMAL LOW (ref 6.5–8.3)
SODIUM: 137 mmol/L (ref 135–145)

## 2018-12-26 LAB — CBC W/ AUTO DIFF
BASOPHILS RELATIVE PERCENT: 0.8 %
EOSINOPHILS ABSOLUTE COUNT: 0.5 10*9/L — ABNORMAL HIGH (ref 0.0–0.4)
HEMATOCRIT: 39.2 % — ABNORMAL LOW (ref 41.0–53.0)
HEMOGLOBIN: 12.6 g/dL — ABNORMAL LOW (ref 13.5–17.5)
LARGE UNSTAINED CELLS: 3 % (ref 0–4)
LYMPHOCYTES ABSOLUTE COUNT: 1.8 10*9/L (ref 1.5–5.0)
LYMPHOCYTES RELATIVE PERCENT: 16.2 %
MEAN CORPUSCULAR HEMOGLOBIN CONC: 32 g/dL (ref 31.0–37.0)
MEAN CORPUSCULAR HEMOGLOBIN: 27 pg (ref 26.0–34.0)
MEAN CORPUSCULAR VOLUME: 84.3 fL (ref 80.0–100.0)
MEAN PLATELET VOLUME: 8.7 fL (ref 7.0–10.0)
MONOCYTES ABSOLUTE COUNT: 1.2 10*9/L — ABNORMAL HIGH (ref 0.2–0.8)
NEUTROPHILS ABSOLUTE COUNT: 7.3 10*9/L (ref 2.0–7.5)
NEUTROPHILS RELATIVE PERCENT: 65.7 %
PLATELET COUNT: 295 10*9/L (ref 150–440)
RED BLOOD CELL COUNT: 4.66 10*12/L (ref 4.50–5.90)
RED CELL DISTRIBUTION WIDTH: 14.7 % (ref 12.0–15.0)
WBC ADJUSTED: 11.1 10*9/L — ABNORMAL HIGH (ref 4.5–11.0)

## 2018-12-26 LAB — D-DIMER QUANTITATIVE (CH,ML,PD,ET): Fibrin D-dimer DDU:MCnc:Pt:PPP:Qn:: 617 — ABNORMAL HIGH

## 2018-12-26 LAB — PRO-BNP: Natriuretic peptide.B prohormone N-Terminal:MCnc:Pt:Ser/Plas:Qn:: 660 — ABNORMAL HIGH

## 2018-12-26 LAB — ANION GAP: Anion gap 3:SCnc:Pt:Ser/Plas:Qn:: 6 — ABNORMAL LOW

## 2018-12-26 LAB — TROPONIN I: Troponin I.cardiac:MCnc:Pt:Ser/Plas:Qn:: <0.034

## 2018-12-26 LAB — LARGE UNSTAINED CELLS: Lab: 3

## 2018-12-26 LAB — D-DIMER, QUANTITATIVE: D-DIMER QUANTITATIVE (CH,ML,PD,ET): 617 ng/mL — ABNORMAL HIGH

## 2018-12-27 DIAGNOSIS — Z944 Liver transplant status: Principal | ICD-10-CM

## 2018-12-27 DIAGNOSIS — E612 Magnesium deficiency: Principal | ICD-10-CM

## 2018-12-27 DIAGNOSIS — Z79899 Other long term (current) drug therapy: Principal | ICD-10-CM

## 2018-12-27 LAB — CBC
HEMATOCRIT: 36.1 % — ABNORMAL LOW (ref 41.0–53.0)
HEMOGLOBIN: 11.4 g/dL — ABNORMAL LOW (ref 13.5–17.5)
MEAN CORPUSCULAR HEMOGLOBIN CONC: 31.5 g/dL (ref 31.0–37.0)
MEAN CORPUSCULAR HEMOGLOBIN: 26.5 pg (ref 26.0–34.0)
MEAN CORPUSCULAR VOLUME: 84.1 fL (ref 80.0–100.0)
MEAN PLATELET VOLUME: 8.3 fL (ref 7.0–10.0)
PLATELET COUNT: 292 10*9/L (ref 150–440)
RED CELL DISTRIBUTION WIDTH: 14.4 % (ref 12.0–15.0)
WBC ADJUSTED: 11 10*9/L (ref 4.5–11.0)

## 2018-12-27 LAB — PHOSPHORUS: Phosphate:MCnc:Pt:Ser/Plas:Qn:: 3.8

## 2018-12-27 LAB — COMPREHENSIVE METABOLIC PANEL
ALBUMIN: 3.5 g/dL (ref 3.5–5.0)
ALKALINE PHOSPHATASE: 107 U/L (ref 38–126)
ALT (SGPT): 34 U/L (ref <50)
ANION GAP: 8 mmol/L (ref 7–15)
BILIRUBIN TOTAL: 0.5 mg/dL (ref 0.0–1.2)
BLOOD UREA NITROGEN: 20 mg/dL (ref 7–21)
BUN / CREAT RATIO: 22
CALCIUM: 8.9 mg/dL (ref 8.5–10.2)
CHLORIDE: 106 mmol/L (ref 98–107)
CO2: 25 mmol/L (ref 22.0–30.0)
CREATININE: 0.89 mg/dL (ref 0.70–1.30)
EGFR CKD-EPI AA MALE: >90 mL/min/{1.73_m2} (ref >=60)
EGFR CKD-EPI NON-AA MALE: >90 mL/min/{1.73_m2} (ref >=60)
GLUCOSE RANDOM: 97 mg/dL (ref 70–179)
POTASSIUM: 4.5 mmol/L (ref 3.5–5.0)
PROTEIN TOTAL: 6.1 g/dL — ABNORMAL LOW (ref 6.5–8.3)
SODIUM: 139 mmol/L (ref 135–145)

## 2018-12-27 LAB — GLUCOSE FLUID: Lab: 89

## 2018-12-27 LAB — POTASSIUM: Potassium:SCnc:Pt:Ser/Plas:Qn:: 4.5

## 2018-12-27 LAB — MEAN PLATELET VOLUME: Lab: 8.3

## 2018-12-27 LAB — GLUCOSE, BODY FLUID: GLUCOSE FLUID: 89 mg/dL

## 2018-12-27 LAB — TACROLIMUS, TROUGH
Lab: 11
Lab: 8

## 2018-12-27 LAB — MAGNESIUM: Magnesium:MCnc:Pt:Ser/Plas:Qn:: 1.5 — ABNORMAL LOW

## 2018-12-27 NOTE — Unmapped (Signed)
Patient had egd for liver txp stent removal today. He left VM for coordinator in the afternoon requesting return call for cont'd chest pain. Spoke to patient who said he has been having recurring chest pain and sob, like he had when he went to the hospital previously, thinking he was having a heart attack. He describes having 6/10 pain, difficulty taking more than a few steps without becoming out of breath and soreness in his esophagus and left shoulder blade which began four days ago. He also reports it worsens when lying down, so he has kept himself elevated in bed. He saw Dr.Moon in Dr.Desai's clinic last week and was given a referral for interventional pulm, but has no appt at this time.Spoke with Dr.Shah, who said given his previous CT and worsening patient should come [facility] ED for evaluation tonight. Relayed msg to patient who verbalized understanding.

## 2018-12-27 NOTE — Unmapped (Signed)
Pt having ongoing SOB and CP, states lung was nicked during a surgery. Pain worse with inspiration. Had neg covid test on Saturday.

## 2018-12-27 NOTE — Unmapped (Signed)
Problem: Adult Inpatient Plan of Care  Goal: Absence of Hospital-Acquired Illness or Injury  Outcome: Ongoing - Unchanged  Goal: Optimal Comfort and Wellbeing  Outcome: Ongoing - Unchanged  Goal: Readiness for Transition of Care  Outcome: Ongoing - Unchanged    Pt arrived to unit from ED. Pt alert and oriented. Pleasant and responsive during interaction. Admission complete. Pt oriented to room. Questions answered. Pt afebrile with vital signs stable. Medications as prescribed with no PRNs. Pt NPO for possible procedure in the am. Pt independent with ADLs. Pt remains free from fall or injury. No significant changes in pt condition. At this time pt resting quietly with call bell within reach. Bed low and wheels locked.

## 2018-12-27 NOTE — Unmapped (Addendum)
[facility]  Emergency Department Provider Note        ED Clinical Impression      Final diagnoses:   SOB (shortness of breath) (Primary)           Impression, ED Course, Assessment and Plan      Impression: 58 year old male with history of alpha 1 antitrypsin cirrhosis status post liver transplant 09/16/2018, immunosuppression, COPD presenting with shortness of breath and chest pain radiating to his left shoulder.  Chest pain not worse with exertion.  Of note he had a recent admission 11/29/2018 for symptoms that he states feels the same.  At that time he was found to have an abdominal fluid collection that was drained by VIR.  He did have relief following the procedure, however his symptoms returned 4 days ago.  Had a procedure today for removal of a stent in his liver.    On initial evaluation, patient uncomfortable appearing increased respiratory rate in the low 30s, satting in the low 90s on pulse ox.  Intermittently tachycardic in the low 100s.  On exam chest pain is not producible with palpation, clear lungs, benign cardiac exam.  Abdomen is mildly distended, diffusely tender to palpation of the abdomen.     Differential diagnosis includes new abdominal fluid collection given similar symptoms to prior admission and mild abdominal distention with tenderness.  Chest pain with radiation to the left shoulder could be consistent with diaphragmatic irritation with radiation in the phrenic nerve distribution.  Moderate concern for PE given low oxygen saturation intermittent tachycardia in the setting of shortness of breath.  Also elevated risk for PE in the setting of recent surgery.  Low likelihood for COPD exacerbation given lack of cough and benign pulmonary exam.  Additional differential includes heart failure in the setting of abdominal distention shortness of breath, or ACS given chest pain and shortness of breath. Will initiate work-up with CBC, CMP, troponin, proBNP, chest x-ray, D-dimer.    7:34 PM CXR obtained though low quality, read as no pneumo, small b/l pleural effusions, new bibasilar opacities. Ordered repeat 2-view CXR to better eval.    7:55 PM  Troponin within normal limits.  WBC 11.1.  proBNP elevated to 660, slightly elevated from 529 on 10/6.    8:00 PM  D-dimer elevated at 617. CTA chest ordered for PE rule out.     8:05 PM  Transplant surgery paged.     8:42 PM  Transplant surgery called, plan to see patient.     9:05 PM  Transplant surgery here to see patient.     9:13 PM  Transplant surgery requests CT abdomen pelvis with contrast.  Discussed with radiology, given creatinine and EGFR wnl radiologist stated that both studies may be obtained safely. CT A/P w IV contrast ordered.     9:37 PM  2 view chest x-ray obtained and displays persistent bilateral pleural effusions, improved left lower lobe aeration.    12:00 AM  Patient was signed out to the incoming provider.  CTA chest and CT abdomen pelvis pending at time of signout.  Dispo pending, however anticipate admission to transplant surgery if CT abdomen pelvis positive for fluid collection or other pathology amenable to surgical intervention.       Additional Medical Decision Making     I have reviewed the vital signs and the nursing notes. Labs and radiology results that were available during my care of the patient were independently reviewed by me and considered in my medical  decision making.     I directly visualized and independently interpreted the EKG tracing.   I independently visualized the radiology images.   I reviewed the patient's prior medical records.   I discussed the case with the Transplant surgery consultant.   I discussed the case and plan for continuity of care with the admitting provider.     I discussed the case with Dr. Shirlee Latch, the ED attending physician. Portions of this record have been created using [employer]. Dictation errors have been sought, but these may not have been identified and corrected.     ____________________________________________         History        Chief Complaint  Shortness of Breath      HPI   Anthony Alvarado is a 58 y.o. male with history of A1AT cirrhosis status post liver transplant 09/16/2018, immunosuppressed on Bactrim for prophylaxis, presenting with shortness of breath chest pain that radiates to his left shoulder.  He states that he has had 4 days of chest pain that starts in the center of his chest and radiates to the posterior aspect of his left shoulder.  It is worse with deep breathing.  Is also present at rest.  Shortness of breath is worse with exertion.  Describes the chest pain is sharp in nature.  Does endorse some associated abdominal pain.  Of note he had a very similar pain in early October, prompting him to come to the hospital for admission.  On 10/8 he had abdominal fluid collection that was drained by VIR.  He did note some relief following this procedure, but now states that his symptoms are very similar.  He notes that during his liver biopsy in mid July, his doctor told him that they nicked his lung, and he is unsure whether this is associated.  He had a procedure today for stent removal in his liver.  States that this was a scheduled procedure.  Denies chest pressure.  Denies nausea/vomiting.      Past Medical History:   Diagnosis Date   ??? Aortic valve stenosis    ??? Autoimmune cholangitis    ??? 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 Cholestatic cirrhosis (CMS-HCC)    ??? Hepatic encephalopathy (CMS-HCC)    ??? Hypertension    ??? Sleep apnea    ??? Type 2 diabetes mellitus (CMS-HCC)        Patient Active Problem List   Diagnosis   ??? Diabetes mellitus without complication (CMS-HCC)   ??? Hypothyroidism   ??? Cirrhosis of liver without ascites (CMS-HCC)   ??? OSA (obstructive sleep apnea) ??? Essential hypertension   ??? Encounter for pre-transplant evaluation for chronic liver disease   ??? Pleural effusion   ??? Pneumothorax after biopsy   ??? Liver transplant recipient (CMS-HCC)   ??? Allergic rhinitis   ??? Arthralgia   ??? Asthma   ??? Bicuspid aortic valve   ??? Flying phobia   ??? Mucosal abnormality of stomach   ??? Nephrolithiasis   ??? Pure hypercholesterolemia   ??? Umbilical hernia   ??? Unspecified hearing loss       Past Surgical History:   Procedure Laterality Date   ??? APPENDECTOMY     ??? CHG US GUIDE, TISSUE ABLATION N/A 05/03/2018    Procedure: ULTRASOUND GUIDANCE FOR, AND MONITORING OF, PARENCHYMAL TISSUE ABLATION;  Surgeon: Particia Nearing, MD;  Location: MAIN OR [facility];  Service: Transplant   ???  PR CATH PLACE/CORON ANGIO, IMG SUPER/INTERP,R&L HRT CATH, L HRT VENTRIC N/A 03/21/2018    Procedure: Left/Right Heart Catheterization;  Surgeon: Neal Dy, MD;  Location: [facility] CATH;  Service: Cardiology   ??? PR LAP,ABLAT 1+ LIVER TUMOR(S),RADIOFREQ N/A 05/03/2018    Procedure: LAPAROSCOPY, SURGICAL, ABLATION OF 1 OR MORE LIVER TUMOR(S); RADIOFREQUENCY;  Surgeon: Particia Nearing, MD;  Location: MAIN OR [facility];  Service: Transplant   ??? PR TRANSPLANT LIVER,ALLOTRANSPLANT Bilateral 09/16/2018    Procedure: LIVER ALLOTRANSPLANTATION; ORTHOTOPIC, PARTIAL OR WHOLE, FROM CADAVER OR LIVING DONOR, ANY AGE;  Surgeon: Florene Glen, MD;  Location: MAIN OR [facility];  Service: Transplant   ??? PR TRANSPLANT,PREP DONOR LIVER, WHOLE N/A 09/16/2018    Procedure: BACKBNCH STD PREP CAD DONOR WHOLE LIVER GFT PRIOR TNSPLNT,INC CHOLE,DISS/REM SURR TISSU WO TRISEG/LOBE SPLT;  Surgeon: Florene Glen, MD;  Location: MAIN OR [place];  Service: Transplant         Current Facility-Administered Medications:   ???  MORPhine 4 mg/mL injection 4 mg, 4 mg, Intravenous, Once, Ellard Artis, MD  ???  ondansetron (ZOFRAN) injection 4 mg, 4 mg, Intravenous, Once, Ellard Artis, MD    Current Outpatient Medications: ???  acetaminophen (TYLENOL) 325 MG tablet, Take 2 tablets (650 mg total) by mouth every six (6) hours as needed for pain or fever (> 38C or 100.63F)., Disp: 100 tablet, Rfl: 0  ???  alendronate (FOSAMAX) 70 MG tablet, Take 1 tablet (70 mg total) by mouth every seven (7) days., Disp: 4 tablet, Rfl: 11  ???  amLODIPine (NORVASC) 5 MG tablet, Take 2 tablets (10 mg total) by mouth daily., Disp: 30 tablet, Rfl: 11  ???  aspirin (ECOTRIN) 81 MG tablet, Take 1 tablet (81 mg total) by mouth daily., Disp: 30 tablet, Rfl: 11  ???  azelastine (ASTELIN) 137 mcg (0.1 %) nasal spray, 1 spray by Each Nare route daily as needed. , Disp: , Rfl:   ???  blood sugar diagnostic Strp, Use as directed Three (3) times a day before meals., Disp: 90 each, Rfl: 11  ???  calcium carbonate (TUMS) 200 mg calcium (500 mg) chewable tablet, Chew 1 tablet daily as needed for heartburn., Disp: , Rfl:   ???  ergocalciferol (DRISDOL) 1,250 mcg (50,000 unit) capsule, Take 1 capsule (50,000 Units total) by mouth once a week for 4 doses., Disp: 4 capsule, Rfl: 0  ???  EUCRISA 2 % Oint, Apply 1 application topically daily as needed. , Disp: , Rfl:   ???  fluticasone propionate (FLONASE) 50 mcg/actuation nasal spray, PLACE ONE OR TWO SPRAYS INTO BOTH NOSTRILS DAILY AS NEEDED., Disp: , Rfl:   ???  gabapentin (NEURONTIN) 300 MG capsule, Take 1 capsule (300 mg total) by mouth two (2) times a day., Disp: 60 capsule, Rfl: 5  ???  lancets 33 gauge Misc, 1 each by Miscellaneous route Three (3) times a day before meals., Disp: 100 each, Rfl: 11  ???  levothyroxine (SYNTHROID) 150 MCG tablet, Take 1 tablet (150 mcg total) by mouth daily., Disp: 30 tablet, Rfl: 11  ???  magnesium oxide-Mg AA chelate (MAGNESIUM, AMINO ACID CHELATE,) 133 mg Tab, Take 1 tablet by mouth Two (2) times a day. HOLD until directed to start by your coordinator., Disp: 60 tablet, Rfl: 11  ???  metroNIDAZOLE (METROGEL) 1 % gel, Apply 1 application topically daily as needed. , Disp: , Rfl: ???  mycophenolate (MYFORTIC) 180 MG EC tablet, Take 2 tablets (360 mg total) by mouth Two (2) times  a day., Disp: 120 tablet, Rfl: 11  ???  oxyCODONE (ROXICODONE) 5 MG immediate release tablet, Take 1-2 tablets (5-10 mg total) by mouth every six (6) hours as needed for pain., Disp: 40 tablet, Rfl: 0  ???  sertraline (ZOLOFT) 25 MG tablet, Take 1 tablet (25 mg total) by mouth daily., Disp: 30 tablet, Rfl: 11  ???  sulfamethoxazole-trimethoprim (BACTRIM) 400-80 mg per tablet, Take 1 tablet (80 mg of trimethoprim total) by mouth 3 (three) times a week., Disp: 12 tablet, Rfl: 5  ???  tacrolimus (PROGRAF) 0.5 MG capsule, Take 4 capsules (2 mg total) by mouth two (2) times a day., Disp: 240 capsule, Rfl: 11  ???  TiZANidine (ZANAFLEX) 2 MG capsule, Take 2 mg by mouth Three (3) times a day as needed for muscle spasms., Disp: , Rfl:     Facility-Administered Medications Ordered in Other Encounters:   ???  diazePAM (VALIUM) 5 mg/mL injection, , , ,     Allergies  Watermelon flavor    Family History   Problem Relation Age of Onset   ??? Asthma Mother    ??? COPD Father    ??? Cancer Father    ??? Autoimmune disease Sister         AIH   ??? Heart disease Sister    ??? Colon cancer Maternal Grandfather        Social History  Social History     Tobacco Use   ??? Smoking status: Never Smoker   ??? Smokeless tobacco: Never Used   Substance Use Topics   ??? Alcohol use: Not Currently   ??? Drug use: Never       Review of Systems     Constitutional: Negative for fever.  Eyes: Negative for visual or hearing changes.  ENT: Negative for sore throat.  Cardiovascular: Positive for CP, see above  Respiratory: Negative for cough, positive for SOB (see above)  Gastrointestinal: Positive for abdominal pain, negative for vomiting, nausea.  Genitourinary: Negative for dysuria.  Musculoskeletal: Positive for back pain.  Skin: Negative for rash.  Neurological: Negative for headaches, focal weakness or numbness. All other systems reviewed and are negative except as noted in HPI.      Physical Exam     ED Triage Vitals [12/26/18 1852]   Enc Vitals Group      BP 128/92      Heart Rate 91      SpO2 Pulse       Resp 22      Temp 36.8 ??C (98.3 ??F)      Temp Source Oral      SpO2 95 %      Weight       Height       Head Circumference       Peak Flow       Pain Score       Pain Loc       Pain Edu?       Excl. in GC?        Constitutional: Alert and oriented.  Uncomfortable appearing, mildly increased respiratory effort.  Eyes: Conjunctivae are normal. PERRL.   ENT       Head: Normocephalic and atraumatic.       Nose: No epistaxis.       Mouth/Throat: Mucous membranes are moist, oropharynx without visible erythema or exudate.        Neck: No stridor.  Hematological/Lymphatic/Immunilogical: No abnormal bruising.  Cardiovascular: Normal rate, regular rhythm. No murmurs,  rubs, or gallops appreciated. Normal and symmetric distal pulses are present in all extremities.  Respiratory: Mildly increased respiratory effort, no accessory muscle use, tachypneic.  Clear breath sounds, low tidal volumes, no crackles or rhonchi appreciated.  Gastrointestinal: Mild diffuse tenderness, mild distention.  No rebound or guarding.  Genitourinary: Deferred.   Musculoskeletal: Moves all extremities spontaneously.        Right lower leg: No tenderness or edema.       Left lower leg: No tenderness or edema.  Neurologic: Normal speech and language. No gross focal neurologic deficits are appreciated.  Skin: Skin is warm, dry and intact. No rash noted.  Psychiatric: Mood and affect are normal. Speech and behavior are normal.     EKG     Ecg 12 Lead    Result Date: 12/26/2018  NORMAL SINUS RHYTHM NORMAL ECG WHEN COMPARED WITH ECG OF 29-Nov-2018 09:06, PREMATURE VENTRICULAR BEATS ARE NO LONGER PRESENT       Radiology     Xr Chest 1 View Portable    Result Date: 12/26/2018 EXAM: XR CHEST PORTABLE DATE: 12/26/2018 7:34 PM ACCESSION: [id number] UN DICTATED: 12/26/2018 7:44 PM INTERPRETATION LOCATION: [facility] CLINICAL INDICATION: 58 years old Male with PNEUMOTHORAX  COMPARISON: 11/29/2018 chest radiograph and prior. TECHNIQUE: Portable Chest Radiograph. FINDINGS: New bibasilar confluent opacities. No pneumothorax. Small right greater than left veiling pleural effusions are new. Partially obscured cardiomediastinal silhouette.     New bibasilar opacities (atelectasis and/or consolidation) and small bilateral pleural effusions. No pneumothorax.      Imaging studies independently reviewed and reviewed with attending provider.     Procedures       Procedure(s) performed: None.           Carron Curie, MD  Resident  12/27/18 0004       Carron Curie, MD  Resident  12/27/18 [phone]

## 2018-12-27 NOTE — Unmapped (Signed)
Transplant Surgery H&P    Requesting Attending Physician:  Pearlean Brownie, MD  Service Requesting Consult:  Emergency Medicine  Service Providing Consult: SRF  Consulting Attending: Matilde Haymaker    Assessment/Plan:  Patient is a 58 y.o. male with cryptogenic cirrhosis and hepatocellular carcinoma s/p OLT 09/16/2018, aortic valve stenosis, type 2 diabetes, autoimmune cholangitis with recent drainage of intra-abdominal fluid collection by VIR who presents with similar SOB. CT with ascites, pleural and pericardial effusions.     -- Admit to SRF   -- Consult placed to interventional pulm for drainage of pleural effusions, will call in AM  -- NPO for procedure  -- No indication for antibiotics   -- Re-start home meds     Thank you for including [place] in this patient's care. If you have any questions, concerns or changes in the patient's clinical status, please page the [facility] floor pager.     History of Present Illness:   Reason for Consult:  SOB in pt with hx liver transplant 7/24    Patient is 58 year old male with cryptogenic cirrhosis and hepatocellular carcinoma now status post OLT on 09/16/2018, aortic valve stenosis, type 2 diabetes, autoimmune cholangitis who presents to the emergency department for progressively worsening SOB.     He reports that it started approximately one week ago and has been gradually worsening. He had a similar episode in early October, was found to have an abdominal fluid collection that was aspirated by VIR with resolution of symptoms. He reports associated abdominal distention and left shoulder pain, similar to his last admission. He has occasional fevers after bathing but otherwise denies malaise, chills, nausea, vomiting, diarrhea. He has a good appetite and no bowel or bladder issues.     Past Medical History:   Diagnosis Date   ??? Aortic valve stenosis    ??? Autoimmune cholangitis    ??? 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 Cholestatic cirrhosis (CMS-HCC) ??? Hepatic encephalopathy (CMS-HCC)    ??? Hypertension    ??? Sleep apnea    ??? Type 2 diabetes mellitus (CMS-HCC)        Past Surgical History:   Procedure Laterality Date   ??? APPENDECTOMY     ??? CHG US GUIDE, TISSUE ABLATION N/A 05/03/2018    Procedure: ULTRASOUND GUIDANCE FOR, AND MONITORING OF, PARENCHYMAL TISSUE ABLATION;  Surgeon: Particia Nearing, MD;  Location: MAIN OR [facility];  Service: Transplant   ??? PR CATH PLACE/CORON ANGIO, IMG SUPER/INTERP,R&L HRT CATH, L HRT VENTRIC N/A 03/21/2018    Procedure: Left/Right Heart Catheterization;  Surgeon: Neal Dy, MD;  Location: [facility] CATH;  Service: Cardiology   ??? PR LAP,ABLAT 1+ LIVER TUMOR(S),RADIOFREQ N/A 05/03/2018    Procedure: LAPAROSCOPY, SURGICAL, ABLATION OF 1 OR MORE LIVER TUMOR(S); RADIOFREQUENCY;  Surgeon: Particia Nearing, MD;  Location: MAIN OR [facility];  Service: Transplant   ??? PR TRANSPLANT LIVER,ALLOTRANSPLANT Bilateral 09/16/2018    Procedure: LIVER ALLOTRANSPLANTATION; ORTHOTOPIC, PARTIAL OR WHOLE, FROM CADAVER OR LIVING DONOR, ANY AGE;  Surgeon: Florene Glen, MD;  Location: MAIN OR [facility];  Service: Transplant   ??? PR TRANSPLANT,PREP DONOR LIVER, WHOLE N/A 09/16/2018    Procedure: [facility] STD PREP CAD DONOR WHOLE LIVER GFT PRIOR TNSPLNT,INC CHOLE,DISS/REM SURR TISSU WO TRISEG/LOBE SPLT;  Surgeon: Florene Glen, MD;  Location: MAIN OR [place];  Service: Transplant       Medication:  No current facility-administered medications for this encounter.      Current Outpatient Medications  Medication Sig Dispense Refill   ??? acetaminophen (TYLENOL) 325 MG tablet Take 2 tablets (650 mg total) by mouth every six (6) hours as needed for pain or fever (> 38C or 100.59F). 100 tablet 0   ??? alendronate (FOSAMAX) 70 MG tablet Take 1 tablet (70 mg total) by mouth every seven (7) days. 4 tablet 11   ??? amLODIPine (NORVASC) 5 MG tablet Take 2 tablets (10 mg total) by mouth daily. 30 tablet 11 ??? aspirin (ECOTRIN) 81 MG tablet Take 1 tablet (81 mg total) by mouth daily. 30 tablet 11   ??? azelastine (ASTELIN) 137 mcg (0.1 %) nasal spray 1 spray by Each Nare route daily as needed.      ??? blood sugar diagnostic Strp Use as directed Three (3) times a day before meals. 90 each 11   ??? calcium carbonate (TUMS) 200 mg calcium (500 mg) chewable tablet Chew 1 tablet daily as needed for heartburn.     ??? ergocalciferol (DRISDOL) 1,250 mcg (50,000 unit) capsule Take 1 capsule (50,000 Units total) by mouth once a week for 4 doses. 4 capsule 0   ??? EUCRISA 2 % Oint Apply 1 application topically daily as needed.      ??? fluticasone propionate (FLONASE) 50 mcg/actuation nasal spray PLACE ONE OR TWO SPRAYS INTO BOTH NOSTRILS DAILY AS NEEDED.     ??? gabapentin (NEURONTIN) 300 MG capsule Take 1 capsule (300 mg total) by mouth two (2) times a day. 60 capsule 5   ??? lancets 33 gauge Misc 1 each by Miscellaneous route Three (3) times a day before meals. 100 each 11   ??? levothyroxine (SYNTHROID) 150 MCG tablet Take 1 tablet (150 mcg total) by mouth daily. 30 tablet 11   ??? magnesium oxide-Mg AA chelate (MAGNESIUM, AMINO ACID CHELATE,) 133 mg Tab Take 1 tablet by mouth Two (2) times a day. HOLD until directed to start by your coordinator. 60 tablet 11   ??? metroNIDAZOLE (METROGEL) 1 % gel Apply 1 application topically daily as needed.      ??? mycophenolate (MYFORTIC) 180 MG EC tablet Take 2 tablets (360 mg total) by mouth Two (2) times a day. 120 tablet 11   ??? oxyCODONE (ROXICODONE) 5 MG immediate release tablet Take 1-2 tablets (5-10 mg total) by mouth every six (6) hours as needed for pain. 40 tablet 0   ??? sertraline (ZOLOFT) 25 MG tablet Take 1 tablet (25 mg total) by mouth daily. 30 tablet 11   ??? sulfamethoxazole-trimethoprim (BACTRIM) 400-80 mg per tablet Take 1 tablet (80 mg of trimethoprim total) by mouth 3 (three) times a week. 12 tablet 5 ??? tacrolimus (PROGRAF) 0.5 MG capsule Take 4 capsules (2 mg total) by mouth two (2) times a day. 240 capsule 11   ??? TiZANidine (ZANAFLEX) 2 MG capsule Take 2 mg by mouth Three (3) times a day as needed for muscle spasms.       Facility-Administered Medications Ordered in Other Encounters   Medication Dose Route Frequency Provider Last Rate Last Dose   ??? diazePAM (VALIUM) 5 mg/mL injection                Allergies   Allergen Reactions   ??? Watermelon Flavor      Mouth itching       Social History:  Social History     Tobacco Use   ??? Smoking status: Never Smoker   ??? Smokeless tobacco: Never Used   Substance Use Topics   ??? Alcohol use:  Not Currently   ??? Drug use: Never       Family History   Problem Relation Age of Onset   ??? Asthma Mother    ??? COPD Father    ??? Cancer Father    ??? Autoimmune disease Sister         AIH   ??? Heart disease Sister    ??? Colon cancer Maternal Grandfather        Review of Systems  10 systems were reviewed and are negative except as noted specifically in the HPI.    Objective:   BP 125/91  - Pulse 89  - Temp 36.8 ??C (Oral)  - Resp (!) 32  - SpO2 95%     Intake/Output last 3 shifts:  No intake/output data recorded.    Physical Exam:  Vitals:    12/27/18 0000   BP: 125/91   Pulse: 89   Resp: (!) 32   Temp:    SpO2: 95%      General: well appearing, no acute distress   Neuro: AAO x 3, appropriate to questions  HEENT: normocephalic, atraumatic  Cardiac: Regular rate and rhythm  Pulm: Normal work of breathing on room air, 93% SpO2, lungs clear to auscultation bilaterally    Abdomen: Soft, mildly distended, non tender, without mass. Surgical incisions well healed.   Extremities: Warm and well perfused, no peripheral edema. 2+ radial and DP pulses.     Most Recent Labs:  Lab Results   Component Value Date    WBC 11.1 (H) 12/26/2018    HGB 12.6 (L) 12/26/2018    HCT 39.2 (L) 12/26/2018    PLT 295 12/26/2018       Lab Results   Component Value Date    NA 137 12/26/2018    K 4.6 12/26/2018 CL 107 12/26/2018    CO2 24.0 12/26/2018    BUN 25 (H) 12/26/2018    CREATININE 0.87 12/26/2018    CALCIUM 9.2 12/26/2018    MG 1.4 (L) 12/19/2018    PHOS 3.9 12/19/2018       IMAGING:  Ecg 12 Lead    Result Date: 12/26/2018  NORMAL SINUS RHYTHM NORMAL ECG WHEN COMPARED WITH ECG OF 29-Nov-2018 09:06, PREMATURE VENTRICULAR BEATS ARE NO LONGER PRESENT    Xr Chest 1 View Portable    Result Date: 12/26/2018  EXAM: XR CHEST PORTABLE DATE: 12/26/2018 7:34 PM ACCESSION: [id number] UN DICTATED: 12/26/2018 7:44 PM INTERPRETATION LOCATION: [facility] CLINICAL INDICATION: 58 years old Male with PNEUMOTHORAX  COMPARISON: 11/29/2018 chest radiograph and prior. TECHNIQUE: Portable Chest Radiograph. FINDINGS: New bibasilar confluent opacities. No pneumothorax. Small right greater than left veiling pleural effusions are new. Partially obscured cardiomediastinal silhouette.     New bibasilar opacities (atelectasis and/or consolidation) and small bilateral pleural effusions. No pneumothorax.    Xr Chest 2 Views    Result Date: 12/26/2018  EXAM: XR CHEST 2 VIEWS DATE: 12/26/2018 8:18 PM ACCESSION: [id number] UN DICTATED: 12/26/2018 8:24 PM INTERPRETATION LOCATION: [facility] CLINICAL INDICATION: 58 years old Male with SHORTNESS OF BREATH  COMPARISON: 12/26/2018 chest radiograph at 7:23 PM and prior. TECHNIQUE: PA and Lateral Chest Radiographs.     Interval decrease in left basilar opacities. Confluent right basilar opacities are unchanged. No pneumothorax. Right greater than left pleural effusions are unchanged. Partially obscured cardiomediastinal silhouette. IMPRESSION: Interval improvement in left lower lobe aeration. Otherwise unchanged appearance of bilateral pleural effusions and right basilar opacities.    Ct Abdomen Pelvis W Iv Contrast Only  Result Date: 12/27/2018 EXAM: CT ABDOMEN PELVIS W CONTRAST DATE: 12/26/2018 11:44 PM ACCESSION: [id number] UN DICTATED: 12/27/2018 1:02 AM INTERPRETATION LOCATION: [facility] CLINICAL INDICATION: concern for abdominal fluid collection  COMPARISON: Concurrent chest CT TECHNIQUE: A helical CT scan of the chest, abdomen, and pelvis was obtained with IV contrast from the thoracic inlet through the pubic symphysis. Images were reconstructed in the axial plane. Coronal and sagittal reformatted images were also provided for further evaluation. FINDINGS: LINES AND TUBES: None. LOWER THORAX: Please see concurrent CT Chest for a detailed report. HEPATOBILIARY: Sequelae of liver transplant. The gallbladder is surgically absent. No biliary dilatation. Periportal fluid is most likely secondary to ascites. Similar appearance of multiple peripancreatic fluid collections and periportal stranding.. SPLEEN: Splenule. Splenomegaly. Otherwise, Unremarkable. PANCREAS: Unremarkable. ADRENALS: Unremarkable. KIDNEYS/URETERS: Symmetric nephrograms.  No hydronephrosis or hydroureter. No radiopaque calculi. BLADDER: Unremarkable. PELVIC/REPRODUCTIVE ORGANS: Unremarkable. GI TRACT: Sequelae of the ascending colon resection and reanastomosis. Colonic diverticulosis. No thickened or dilated loops of bowel. PERITONEUM/RETROPERITONEUM AND MESENTERY: Moderate presacral and perihepatic ascites. LYMPH NODES: No enlarged lymph nodes. VESSELS: Normal in caliber.  No significant calcified atherosclerotic disease. BONES AND SOFT TISSUES: Multilevel degenerative disease of the spine.  No concerning soft tissue lesions.  No acute fracture, dislocation, or listhesis.  No aggressive lytic or blastic osseus lesions. Unchanged anterior abdominal wall postsurgical changes. Stable perihepatic fluid collections. Interval development of ascites, moderate right pleural effusion, and pericardial effusion, better assessed on concurrent CT chest. Chronic/incidental findings as above. Please see concurrent CT chest for detailed report.      Cta Chest W Contrast    Result Date: 12/27/2018  EXAM: CTA CHEST W CONTRAST DATE: 12/26/2018 11:44 PM ACCESSION: [id number] UN DICTATED: 12/27/2018 12:48 AM INTERPRETATION LOCATION: [facility] CLINICAL INDICATION: 58 years old Male with rule out PE  COMPARISON: Concurrent CT of the abdomen and pelvis. CT chest dated 04/11/2018 TECHNIQUE: A helical CTA scan was obtained with 100 mL IV contrast from the lung apices to the lung bases. Images were reconstructed in the axial plane. MIP images were provided for evaluation of the aorta. Coronal and sagittal reformatted images were also provided. LINES AND TUBES:  None. AIRWAYS, LUNGS, PLEURA: Clear central tracheobronchial tree.  No lung consolidation.  Moderate right and small left pleural effusions with associated relaxation atelectasis. MEDIASTINUM: Cardiomegaly. Moderate simple pericardial effusion. Normal caliber thoracic aorta.  Subcentimeter mediastinal lymph nodes are most likely reactive. IMAGED ABDOMEN: SOFT TISSUES: Unremarkable. BONES: Lytic T8 lesion most likely representing benign bone island.     Interval development of moderate right pleural effusion, small left pleural effusion, and moderate pericardial effusion. Negative PE study

## 2018-12-27 NOTE — Unmapped (Signed)
Emergency Department Progress Note    December 27, 2018 12:10 AM    Assumed care of patient at shift change    Anthony Alvarado is a 58 y.o.   History of alpha 1 antitrypsin cirrhosis with liver transplant 2020, COPD, presents with shortness of breath and chest pain worse with exertion.  He was admitted on 11/29/2018 for similar symptoms, found to have abdominal fluid collection now status post drainage by VIR, did have relief after procedure.  States her symptoms returned 4 days ago.  Had a procedure today as well for removal of a stent in his liver.    Chest x-ray with small bilateral pleural effusions, new basilar opacities, proBNP slightly elevated from prior.  D-dimer elevated, CT chest ordered to rule out PE, transplant service page, they recommended CT abdomen and pelvis.     At the time of signout, disposition pending CT of the chest and abdomen, anticipate surgical admission to transplant team    Vitals:    12/26/18 1852   BP: 128/92   Pulse: 91   Resp: 22   Temp: 36.8 ??C (98.3 ??F)   TempSrc: Oral   SpO2: 95%        Past Medical History:   Diagnosis Date   ??? Aortic valve stenosis    ??? Autoimmune cholangitis    ??? 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 Cholestatic cirrhosis (CMS-HCC)    ??? Hepatic encephalopathy (CMS-HCC)    ??? Hypertension    ??? Sleep apnea    ??? Type 2 diabetes mellitus (CMS-HCC)         Past Surgical History:   Procedure Laterality Date   ??? APPENDECTOMY     ??? CHG US GUIDE, TISSUE ABLATION N/A 05/03/2018    Procedure: ULTRASOUND GUIDANCE FOR, AND MONITORING OF, PARENCHYMAL TISSUE ABLATION;  Surgeon: Particia Nearing, MD;  Location: MAIN OR [facility];  Service: Transplant   ??? PR CATH PLACE/CORON ANGIO, IMG SUPER/INTERP,R&L HRT CATH, L HRT VENTRIC N/A 03/21/2018    Procedure: Left/Right Heart Catheterization;  Surgeon: Neal Dy, MD;  Location: [facility] CATH;  Service: Cardiology   ??? PR LAP,ABLAT 1+ LIVER TUMOR(S),RADIOFREQ N/A 05/03/2018 Procedure: LAPAROSCOPY, SURGICAL, ABLATION OF 1 OR MORE LIVER TUMOR(S); RADIOFREQUENCY;  Surgeon: Particia Nearing, MD;  Location: MAIN OR [facility];  Service: Transplant   ??? PR TRANSPLANT LIVER,ALLOTRANSPLANT Bilateral 09/16/2018    Procedure: LIVER ALLOTRANSPLANTATION; ORTHOTOPIC, PARTIAL OR WHOLE, FROM CADAVER OR LIVING DONOR, ANY AGE;  Surgeon: Florene Glen, MD;  Location: MAIN OR [facility];  Service: Transplant   ??? PR TRANSPLANT,PREP DONOR LIVER, WHOLE N/A 09/16/2018    Procedure: BACKBNCH STD PREP CAD DONOR WHOLE LIVER GFT PRIOR TNSPLNT,INC CHOLE,DISS/REM SURR TISSU WO TRISEG/LOBE SPLT;  Surgeon: Florene Glen, MD;  Location: MAIN OR [place];  Service: Transplant        Labs Reviewed   COMPREHENSIVE METABOLIC PANEL - Abnormal; Notable for the following components:       Result Value    Anion Gap 6 (*)     BUN 25 (*)     Total Protein 6.4 (*)     All other components within normal limits   PRO-BNP - Abnormal; Notable for the following components:    PRO-BNP 660.0 (*)     All other components within normal limits   D-DIMER, QUANTITATIVE - Abnormal; Notable for the following components:    D-Dimer 617 (*)     All other components within normal  limits    Narrative:     When used in conjunction with a clinical Pre-Test Probability assessment to exclude the venous thromboembolism (VTE) in outpatients suspected of deep vein thrombosis (DVT) and/or pulmonary embolism (PE), a cut-off of <230ng/mL is??recommended.  Note: Due to the lack of an International Reference Standard some manufacturers and literature references express D-dimer results in FEU (Fibrinogen Equivalent Units), while the above results are expressed in D-DU (D-dimer Units). The conversion is 2 FEU = 1 D-DU, so some literature may quote a cut-off value approximately double that shown above.      CBC W/ AUTO DIFF - Abnormal; Notable for the following components:    WBC 11.1 (*)     HGB 12.6 (*)     HCT 39.2 (*) Absolute Monocytes 1.2 (*)     Absolute Eosinophils 0.5 (*)     Hypochromasia Slight (*)     All other components within normal limits    Narrative:     Please use the Absolute Differential for reference ranges.    TROPONIN I - Normal   COVID-19 PCR   CBC W/ DIFFERENTIAL    Narrative:     The following orders were created for panel order CBC w/ Differential.  Procedure                               Abnormality         Status                     ---------                               -----------         ------                     CBC w/ [id number]]         Abnormal            Final result                 Please view results for these tests on the individual orders.   TACROLIMUS LEVEL, TROUGH       XR Chest 2 views   Final Result      Interval decrease in left basilar opacities. Confluent right basilar opacities are unchanged.      No pneumothorax. Right greater than left pleural effusions are unchanged.      Partially obscured cardiomediastinal silhouette.      IMPRESSION:      Interval improvement in left lower lobe aeration.      Otherwise unchanged appearance of bilateral pleural effusions and right basilar opacities.      XR Chest 1 view Portable   Final Result   New bibasilar opacities (atelectasis and/or consolidation) and small bilateral pleural effusions.      No pneumothorax.      CTA Chest W Contrast    (Results Pending)   CT Abdomen Pelvis W IV Contrast Only    (Results Pending)       This provider entered the patient's room: Yes:    ? If this provider did not enter the room, a comprehensive physical exam was not able to be performed due to increased infection risk to themselves, other providers, staff and other patients), as well as to conserve personal protective equipment (PPE)  utilization during the COVID-19 pandemic.    ? If this provider did enter the patient room, the following was PPE worn: CAPR with face shield, gown and gloves I have reviewed the patient's vital signs and the nursing notes. Any pertinent labs & imaging results which were available during my care of the patient were reviewed by me.     Anthony Alvarado was evaluated in Emergency Department at the time of this visit for the symptoms described in the history of present illness. He was evaluated in the context of the global COVID-19 pandemic, which necessitated consideration that the patient might be at risk for infection with the SARS-CoV-2 virus that causes COVID-19. Institutional protocols and algorithms that pertain to the evaluation of patients at risk for COVID-19 were followed during the patient's care in the ED.    Portions of this record have been created using Scientist, clinical (histocompatibility and immunogenetics). Dictation errors have been sought, but may not have been identified and corrected.        Ellard Artis, MD  Resident  12/27/18 [phone]

## 2018-12-27 NOTE — Unmapped (Signed)
.  Patient from triage escorted to room 80. Pt paced on cardiac monitor, continuous pulse oximeter and continuous blood pressure monitoring.     EKG technician done pt EKG.

## 2018-12-27 NOTE — Unmapped (Signed)
Multidisciplinary rounds were conducted with Doyce Loose, MD Transplant Surgeon, [place] Senate, RN, MSN Inpatient Transplant Nurse coordinator, Belva Chimes, PA Transplant Surgery Physician Assistant, Staff PharmD, General Surgery Resident, Carole Binning, LCSW Transplant Social Worker, [place], MD Transplant Nephrologist and Nephrology Fellow. Patient is Acute Care Unit status.    Plan: consult IR pulm for pleural effusions; give lasix/albumin and IVF  Caryl Ada 12/27/2018 10:59 AM

## 2018-12-27 NOTE — Unmapped (Signed)
Tacrolimus Therapeutic Monitoring Pharmacy Note    Anthony Alvarado is a 58 y.o. male continuing tacrolimus.     Indication: Liver transplant     Date of Transplant: 09/16/18      Prior Dosing Information: Home regimen 2 mg po BID     Goals:  Therapeutic Drug Levels  Tacrolimus trough goal: 8-10 ng/mL    Additional Clinical Monitoring/Outcomes  ?? Monitor renal function (SCr and urine output) and liver function (LFTs)  ?? Monitor for signs/symptoms of adverse events (e.g., hyperglycemia, hyperkalemia, hypomagnesemia, hypertension, headache, tremor)    Results:   Tacrolimus level: Not applicable    Pharmacokinetic Considerations and Significant Drug Interactions:  ? Concurrent hepatotoxic medications: None identified  ? Concurrent CYP3A4 substrates/inhibitors: None identified  ? Concurrent nephrotoxic medications: None identified    Assessment/Plan:  Recommendedation(s)  ? Continue current regimen of 2 mg po BID    Follow-up  ? Next level has been ordered on 12/27/18 at 0500.   ? A pharmacist will continue to monitor and recommend levels as appropriate    Please page service pharmacist with questions/clarifications.    [place] K Deleah Tison, PharmD

## 2018-12-28 DIAGNOSIS — J90 Pleural effusion, not elsewhere classified: Principal | ICD-10-CM

## 2018-12-28 LAB — COMPREHENSIVE METABOLIC PANEL
ALBUMIN: 4.3 g/dL (ref 3.5–5.0)
ALKALINE PHOSPHATASE: 112 U/L (ref 38–126)
ALT (SGPT): 39 U/L (ref <50)
ANION GAP: 9 mmol/L (ref 7–15)
AST (SGOT): 35 U/L (ref 19–55)
BILIRUBIN TOTAL: 0.5 mg/dL (ref 0.0–1.2)
BLOOD UREA NITROGEN: 21 mg/dL (ref 7–21)
BUN / CREAT RATIO: 23
CALCIUM: 9 mg/dL (ref 8.5–10.2)
CHLORIDE: 104 mmol/L (ref 98–107)
CO2: 24 mmol/L (ref 22.0–30.0)
CREATININE: 0.9 mg/dL (ref 0.70–1.30)
EGFR CKD-EPI AA MALE: >90 mL/min/{1.73_m2} (ref >=60)
EGFR CKD-EPI NON-AA MALE: >90 mL/min/{1.73_m2} (ref >=60)
GLUCOSE RANDOM: 81 mg/dL (ref 70–179)
POTASSIUM: 4.2 mmol/L (ref 3.5–5.0)
SODIUM: 137 mmol/L (ref 135–145)

## 2018-12-28 LAB — CBC
HEMOGLOBIN: 11.7 g/dL — ABNORMAL LOW (ref 13.5–17.5)
MEAN CORPUSCULAR HEMOGLOBIN: 27 pg (ref 26.0–34.0)
MEAN CORPUSCULAR VOLUME: 82.9 fL (ref 80.0–100.0)
MEAN PLATELET VOLUME: 7.8 fL (ref 7.0–10.0)
RED BLOOD CELL COUNT: 4.32 10*12/L — ABNORMAL LOW (ref 4.50–5.90)
RED CELL DISTRIBUTION WIDTH: 14.3 % (ref 12.0–15.0)
WBC ADJUSTED: 8.3 10*9/L (ref 4.5–11.0)

## 2018-12-28 LAB — MAGNESIUM: Magnesium:MCnc:Pt:Ser/Plas:Qn:: 1.4 — ABNORMAL LOW

## 2018-12-28 LAB — MEAN PLATELET VOLUME: Lab: 7.8

## 2018-12-28 LAB — PHOSPHORUS: Phosphate:MCnc:Pt:Ser/Plas:Qn:: 4.2

## 2018-12-28 LAB — CREATININE: Creatinine:MCnc:Pt:Ser/Plas:Qn:: 0.9

## 2018-12-28 LAB — TACROLIMUS, TROUGH: Lab: 7.9

## 2018-12-28 MED ORDER — OXYCODONE HCL 5 MG PO TABS
5.00 | ORAL_TABLET | ORAL | Status: DC
Start: ? — End: 2018-12-28

## 2018-12-28 MED ORDER — TIZANIDINE HCL 2 MG PO TABS
2.00 | ORAL_TABLET | ORAL | Status: DC
Start: 2018-12-28 — End: 2018-12-28

## 2018-12-28 MED ORDER — ASPIRIN 81 MG PO CHEW
81.00 | CHEWABLE_TABLET | ORAL | Status: DC
Start: 2018-12-29 — End: 2018-12-28

## 2018-12-28 MED ORDER — HEPARIN SODIUM (PORCINE) 5000 UNIT/ML IJ SOLN
5000.00 | INTRAMUSCULAR | Status: DC
Start: 2018-12-28 — End: 2018-12-28

## 2018-12-28 MED ORDER — GENERIC EXTERNAL MEDICATION
Status: DC
Start: ? — End: 2018-12-28

## 2018-12-28 MED ORDER — ACETAMINOPHEN 325 MG PO TABS
650.00 | ORAL_TABLET | ORAL | Status: DC
Start: ? — End: 2018-12-28

## 2018-12-28 MED ORDER — DEXTROSE 50 % IV SOLN
12.50 | INTRAVENOUS | Status: DC
Start: ? — End: 2018-12-28

## 2018-12-28 MED ORDER — MYCOPHENOLATE SODIUM 360 MG PO TBEC
360.00 | DELAYED_RELEASE_TABLET | ORAL | Status: DC
Start: 2018-12-28 — End: 2018-12-28

## 2018-12-28 MED ORDER — INSULIN REGULAR HUMAN 100 UNIT/ML IJ SOLN
0.00 | INTRAMUSCULAR | Status: DC
Start: 2018-12-28 — End: 2018-12-28

## 2018-12-28 MED ORDER — TACROLIMUS 1 MG PO CAPS
2.00 | ORAL_CAPSULE | ORAL | Status: DC
Start: 2018-12-29 — End: 2018-12-28

## 2018-12-28 MED ORDER — ONDANSETRON HCL 4 MG/2ML IJ SOLN
4.00 | INTRAMUSCULAR | Status: DC
Start: ? — End: 2018-12-28

## 2018-12-28 MED ORDER — LEVOTHYROXINE SODIUM 150 MCG PO TABS
150.00 | ORAL_TABLET | ORAL | Status: DC
Start: 2018-12-29 — End: 2018-12-28

## 2018-12-28 MED ORDER — SODIUM CHLORIDE 0.9 % IV SOLN
50.00 | INTRAVENOUS | Status: DC
Start: ? — End: 2018-12-28

## 2018-12-28 MED ORDER — GABAPENTIN 300 MG PO CAPS
300.00 | ORAL_CAPSULE | ORAL | Status: DC
Start: 2018-12-28 — End: 2018-12-28

## 2018-12-28 MED ORDER — SERTRALINE HCL 25 MG PO TABS
25.00 | ORAL_TABLET | ORAL | Status: DC
Start: 2018-12-29 — End: 2018-12-28

## 2018-12-28 MED ORDER — SULFAMETHOXAZOLE-TRIMETHOPRIM 400-80 MG PO TABS
1.00 | ORAL_TABLET | ORAL | Status: DC
Start: 2018-12-30 — End: 2018-12-28

## 2018-12-28 MED ORDER — AMLODIPINE BESYLATE 10 MG PO TABS
10.00 | ORAL_TABLET | ORAL | Status: DC
Start: 2018-12-29 — End: 2018-12-28

## 2018-12-28 MED ORDER — FLUTICASONE PROPIONATE 50 MCG/ACT NA SUSP
2.00 | NASAL | Status: DC
Start: 2018-12-29 — End: 2018-12-28

## 2018-12-28 MED ORDER — OXYCODONE-ACETAMINOPHEN 5 MG-325 MG TABLET
ORAL_TABLET | Freq: Three times a day (TID) | ORAL | 0 refills | 3 days | Status: CP | PRN
Start: 2018-12-28 — End: 2018-12-31

## 2018-12-28 NOTE — Unmapped (Signed)
Per SRF rounds report via EFisher, RN: CXR this AM and f/u pulm recs; probable home this afternoon  Caryl Ada Inpatient Transplant Nurse Coordinator 12/28/2018 12:05 PM

## 2018-12-28 NOTE — Unmapped (Signed)
Reviewed POC with pt. VSS. Afebrile. Voiding adequate urine output. Pt NPO for thoracentesis today. Thoracentesis done and pt now on regular diet. PRN zofran given once for nausea this morning. PRN oxycodone given once for pain. Ambulating independently. No pt questions/concerns at this time. Will continue to monitor.       Problem: Adult Inpatient Plan of Care  Goal: Plan of Care Review  Outcome: Progressing  Goal: Patient-Specific Goal (Individualization)  Outcome: Progressing  Goal: Absence of Hospital-Acquired Illness or Injury  Outcome: Progressing  Goal: Optimal Comfort and Wellbeing  Outcome: Progressing  Goal: Readiness for Transition of Care  Outcome: Progressing  Goal: Rounds/Family Conference  Outcome: Progressing

## 2018-12-28 NOTE — Unmapped (Signed)
Care Management  Initial Transition Planning Assessment              General  Orientation Level: Oriented X4    Contact/Decision Maker  Extended Emergency Contact Information  Primary Emergency Contact: Donnie Coffin  Address: [address]           [place], [place] [address] [place]  Home Phone: [phone]  Mobile Phone: [phone]  Relation: Spouse  Interpreter needed? No  Secondary Emergency Contact: Leonidas Romberg  Mobile Phone: [phone]  Relation: Daughter    Legal Next of Kin / Guardian / POA / Advance Directives     HCDM (patient stated preference): Donnie Coffin - Spouse - [phone]    Advance Directive (Medical Treatment)  Does patient have an advance directive covering medical treatment?: Patient does not have advance directive covering medical treatment.  Reason patient does not have an advance directive covering medical treatment:: Patient does not wish to complete one at this time.    Health Care Decision Maker [HCDM] (Medical & Mental Health Treatment)  Healthcare Decision Maker: Patient does not wish to appoint a Health Care Decision Maker at this time  Information offered on HCDM, Medical & Mental Health advance directives:: Patient given information.    Advance Directive (Mental Health Treatment)  Does patient have an advance directive covering mental health treatment?: Patient does not have advance directive covering mental health treatment.  Reason patient does not have an advance directive covering mental health treatment:: Patient does not wish to complete one at this time.    Patient Information  Lives with: Spouse/significant other    Type of Residence: Private residence        Location/Detail: [place], [place]    Support Systems/Concerns: Case Manager/Social Worker, Family Members, Children         Home Care services in place prior to admission?: No                                  Financial Information               Social Determinants of Health Social Determinants of Health were addressed in provider documentation.  Please refer to patient history.    Discharge Needs Assessment  Concerns to be Addressed:      Clinical Risk Factors:                                    Discharge Facility/Level of Care Needs:      Readmission  Risk of Unplanned Readmission Score: UNPLANNED READMISSION SCORE: 22%  Predictive Model Details           22% (Medium) Factors Contributing to Score   Calculated 12/28/2018 07:29 24% Number of active Rx orders is 39   [place] Risk of Unplanned Readmission Model 20% Number of ED visits in last six months is 4     9% Number of hospitalizations in last year is 2     8% ECG/EKG order is present in last 6 months     7% Restraint order is present in last 6 months     6% Imaging order is present in last 6 months     5% Latest hemoglobin is low (11.7 g/dL)     5% Phosphorous result is present     5% Charlson Comorbidity Index is  5     4% Age is 58     4% Active anticoagulant Rx order is present     2% Future appointment is scheduled     1% Current length of stay is 1.176 days     Readmitted Within the Last 30 Days? (No if blank)        Discharge Plan       Expected Discharge Date:     Expected Transfer from Critical Care:

## 2018-12-28 NOTE — Unmapped (Signed)
OCCUPATIONAL THERAPY  Evaluation (12/28/18 1325)    Patient Name:  Anthony Alvarado       Medical Record Number: [id number]   Date of Birth: 08-23-1960  Sex: Male              Problem List: Decreased endurance    Assessment: Anthony Alvarado is a 58 y.o. male with h/o male with cryptogenic cirrhosis and hepatocellular carcinoma s/p OLT 09/16/2018, aortic valve stenosis, type 2 diabetes, autoimmune cholangitis with recent drainage of intra-abdominal fluid collection by VIR who presents with similar SOB. CT with ascites, pleural and pericardial effusions.  Now s/p thoracentesis 12/27/2018 with 1L output. He presents to acute OT c decreased endurance. After review of the patient's occupational profile and history, assessment of occupational performance, clinical decision making, and development of POC, the patient presents as a moderate complexity case. Based on patient's CLOF, continued OT services are not warranted. No follow up OT is anticipated.    Today's Interventions: OT eval, education re: activity pacing and energy conservation.    Activity Tolerance During Today's Session  Patient tolerated treatment well    Plan  Planned Frequency of Treatment:  D/C Services for: D/C Services    Post-Discharge Occupational Therapy Recommendations:  OT Post Acute Discharge Recommendations: OT services not indicated   OT DME Recommendations: None    Prognosis:  Excellent  Positive Indicators:  CLOF  Barriers to Discharge: None    Subjective  Current Status Received and left seated in BS chair  Prior Functional Status (I) PTA    Medical Tests / Procedures: Reviewed         Past Medical History:   Diagnosis Date   ??? Aortic valve stenosis    ??? Autoimmune cholangitis    ??? 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 Cholestatic cirrhosis (CMS-HCC)    ??? Hepatic encephalopathy (CMS-HCC)    ??? Hypertension    ??? Sleep apnea    ??? Type 2 diabetes mellitus (CMS-HCC)     Social History     Tobacco Use ??? Smoking status: Never Smoker   ??? Smokeless tobacco: Never Used   Substance Use Topics   ??? Alcohol use: Not Currently      Past Surgical History:   Procedure Laterality Date   ??? APPENDECTOMY     ??? CHG US GUIDE, TISSUE ABLATION N/A 05/03/2018    Procedure: ULTRASOUND GUIDANCE FOR, AND MONITORING OF, PARENCHYMAL TISSUE ABLATION;  Surgeon: Particia Nearing, MD;  [facility] MAIN [facility];  Service: Transplant   ??? PR CATH PLACE/CORON ANGIO, IMG SUPER/INTERP,R&L HRT CATH, L HRT VENTRIC N/A 03/21/2018    Procedure: Left/Right Heart Catheterization;  Surgeon: Neal Dy, MD;  Location: [facility] CATH;  Service: Cardiology   ??? PR LAP,ABLAT 1+ LIVER TUMOR(S),RADIOFREQ N/A 05/03/2018    Procedure: LAPAROSCOPY, SURGICAL, ABLATION OF 1 OR MORE LIVER TUMOR(S); RADIOFREQUENCY;  Surgeon: Particia Nearing, MD;  Location: MAIN OR [facility];  Service: Transplant   ??? PR TRANSPLANT LIVER,ALLOTRANSPLANT Bilateral 09/16/2018    Procedure: LIVER ALLOTRANSPLANTATION; ORTHOTOPIC, PARTIAL OR WHOLE, FROM CADAVER OR LIVING DONOR, ANY AGE;  Surgeon: Florene Glen, MD;  Location: MAIN OR [facility];  Service: Transplant   ??? PR TRANSPLANT,PREP DONOR LIVER, WHOLE N/A 09/16/2018    Procedure: [facility] STD PREP CAD DONOR WHOLE LIVER GFT PRIOR TNSPLNT,INC CHOLE,DISS/REM SURR TISSU WO TRISEG/LOBE SPLT;  Surgeon: Florene Glen, MD;  Location: [facility];  Service: Transplant    Family History  Problem Relation Age of Onset   ??? Asthma Mother    ??? COPD Father    ??? Cancer Father    ??? Autoimmune disease Sister         AIH   ??? Heart disease Sister    ??? Colon cancer Maternal Grandfather         Watermelon flavor     Objective Findings  Precautions / Restrictions  Non-applicable    Weight Bearing  Non-applicable    Required Braces or Orthoses  Non-applicable    Communication Preference  Verbal    Pain  Denies pain at this time    Equipment / Environment  Vascular access (PIV, TLC, Port-a-cath, PICC)    Living Situation Living Environment: House  Lives With: (Ex-wife - available 24/7)  Home Living: Two level home, Full bath main level, Able to Live on main level with bedroom/bathroom, Stairs to enter without rails, Raised toilet seat without rails, Tub/shower unit, Walk-in shower  Number of Stairs: 2     Cognition   Orientation Level:  Oriented x 4   Arousal/Alertness:  Appropriate responses to stimuli   Attention Span:  Appears intact   Memory:  Appears intact   Following Commands:  Follows all commands and directions without difficulty   Safety Judgment:  Good awareness of safety precautions   Awareness of Errors:  Good awareness of safety precautions   Problem Solving:  Able to problem solve independently   Comments:      Vision / Perception    Hearing: WFL   Vision: Wears glasses for reading only  Perception: NT     Hand Function  Hand Dominance: LHD  WFL    Skin Inspection  Visable skin intact    ROM / Strength/Coordination  UE ROM/ Strength/ Coordination: WFL  LE ROM/ Strength/ Coordination: WFL    Sensation:  Denies parasthesias    Balance:  Mod (I)    Functional Mobility  Transfer Assistance Needed: No  Bed Mobility Assistance Needed: No    ADLs  ADLs: Modified Independent  IADLs: NT    Vitals / Orthostatics  At Rest: NAD  With Activity: NAD    Medical Staff Made Aware: RN aware    Occupational Therapy Session Duration  OT Individual - Duration: 20       I attest that I have reviewed the above information.  Signed: Gunnar Fusi, OT  Filed 12/28/2018

## 2018-12-28 NOTE — Unmapped (Signed)
TRANSPLANT SURGERY PROGRESS NOTE    Service Date: 12/28/2018  Admit Date: 12/26/2018, Hospital Day: 3  Hospital Service: Surg Transplant [facility])  Attending: Doyce Loose,*    Assessment     Anthony Alvarado is a 58 y.o. male with h/o male with cryptogenic cirrhosis and hepatocellular carcinoma s/p OLT 09/16/2018, aortic valve stenosis, type 2 diabetes, autoimmune cholangitis with recent drainage of intra-abdominal fluid collection by VIR who presents with similar SOB. CT with ascites, pleural and pericardial effusions.  Now s/p thoracentesis 12/27/2018 with 1L output.    Plan   Fluids: NS @50cc /hr  UO: appropriate  Diet: regular  DVT: heparin (subcutaneous)  Lines: None    Neuro: pain well-controlled.     CV: HDS   - Home meds: ASA, amlodipine  - SQH    Pulm: stable on Room AIR. IS. OOB tid  *Dyspnea in setting of persistent R sided pleural effusion  - OOB to chair TID, Incentive spirometry and inpatient consult to Interventional Pulmonology, Recs appreciated  -Appreciate Pulm: S/p right sided thoracentesis 11/3 with 950 ml of serosanguineous fluid drained.??  -Fluid studies are consistent with an exudative effusion. Differential remains pending. Etiologies can include infectious versus movement of liquid from abdomen to the pleural space, given recent drainage of abdominal fluid collection     ??- No airspace consolidation identified on imaging therefore it is unlikely that this is a parapneumonic process     FEN/GI: Regular diet. Continue 0.9NS at 50 mL/hr.   *Nausea Zofran PRN  *Electrolytes: Replace PRN  *GI Ppx: None    GU: Adequate UOP. Cr 0.89 on admission.    *Foley: Voiding spontaneously   - Home Zanaflex    Heme/ID: Afebrile. No leukocytosis.     Endo: Hx DM2, hypothyroidism  - Home Synthroid    Disposition: Floor status. Likely home this afternoon following final pulm recs.    -----  [name], MD  Surgery Resident, PGY-3  12/28/18 07:58    Subjective No acute events overnight. Pain Controlled. No fever or chills. Denies SOB.     Objective     Vitals:   Temp:  [36.2 ??C-37.1 ??C] 36.8 ??C  Heart Rate:  [74-82] 77  Resp:  [16-20] 18  BP: (92-131)/(61-84) 118/82  MAP (mmHg):  [87-94] 94  SpO2:  [94 %-97 %] 97 %    Intake/Output last 24 hours:  I/O last 3 completed shifts:  In: 500 [I.V.:500]  Out: 4150 [Urine:4150]    Physical Exam:  -General:  Appropriate, comfortable and in no apparent distress.   -Neurological: Moves all 4 extremities spontaneously.   -Cardiovascular: Regular rate and rhythm.  -Pulmonary: Normal work of breathing on RA.   -Abdomen: Soft, non-tender, non-distended. No rebound or guarding.   -Extremities: Warm, well perfused.     Labs:  All lab results last 24 hours:    Recent Results (from the past 24 hour(s))   POCT Glucose    Collection Time: 12/27/18 10:57 AM   Result Value Ref Range    Glucose, POC 102 70 - 179 mg/dL   Glucose, Body Fluid    Collection Time: 12/27/18  5:32 PM   Result Value Ref Range    Glucose, Fluid 89 mg/dL    Glucose, Fluid Type Fluid, Pleural    Albumin Level, Body Fluid    Collection Time: 12/27/18  5:32 PM   Result Value Ref Range    Albumin, Fluid 2.4 g/dL    Albumin, Fluid Type Fluid, Pleural  Cholesterol, Body Fluid    Collection Time: 12/27/18  5:32 PM   Result Value Ref Range    Cholesterol, Fluid 52 mg/dL    Chol, Fluid Type Fluid, Pleural    Lactate Dehydrogenase, Body Fluid    Collection Time: 12/27/18  5:32 PM   Result Value Ref Range    LDH, Fluid 775 U/L    LD, Fluid Type Fluid, Pleural    Protein, Body Fluid    Collection Time: 12/27/18  5:32 PM   Result Value Ref Range    Protein, Fluid 4.2 g/dL    Protein, Fluid Type Fluid, Pleural    Body fluid cell count    Collection Time: 12/27/18  5:33 PM   Result Value Ref Range    Fluid Type Fluid, Pleural     Color, Fluid Red     Appearance, Fluid Opaque     Nucleated Cells, Fluid 840 Undefined ul    RBC, Fluid 36,500 ul    Neutrophil %, Fluid 7.0 % Lymphocytes %, Fluid 12.0 %    Mono/Macro % , Fluid 32.0 %    Eosinophils %, Fluid 46.0 %    Basophils %, Fluid 1.0 %    Other Cells %, Fluid 2.0 %    #Cells Counted BF Diff 100     Fluid Comments Macrophages and mesothelial cells present.      Pleural Fluid Culture    Collection Time: 12/27/18  5:33 PM    Specimen: Pleural, right; Fluid, Pleural   Result Value Ref Range    Gram Stain Result Direct Specimen Gram Stain     Gram Stain Result 1+ Polymorphonuclear leukocytes     Gram Stain Result No organisms seen    POCT Glucose    Collection Time: 12/27/18  5:55 PM   Result Value Ref Range    Glucose, POC 70 70 - 179 mg/dL   POCT Glucose    Collection Time: 12/27/18  9:00 PM   Result Value Ref Range    Glucose, POC 207 (H) 70 - 179 mg/dL   Magnesium Level    Collection Time: 12/28/18  5:24 AM   Result Value Ref Range    Magnesium 1.4 (L) 1.6 - 2.2 mg/dL   Phosphorus Level    Collection Time: 12/28/18  5:24 AM   Result Value Ref Range    Phosphorus 4.2 2.9 - 4.7 mg/dL   Comprehensive metabolic panel    Collection Time: 12/28/18  5:24 AM   Result Value Ref Range    Sodium 137 135 - 145 mmol/L    Potassium 4.2 3.5 - 5.0 mmol/L    Chloride 104 98 - 107 mmol/L    Anion Gap 9 7 - 15 mmol/L    CO2 24.0 22.0 - 30.0 mmol/L    BUN 21 7 - 21 mg/dL    Creatinine 1.61 0.96 - 1.30 mg/dL    BUN/Creatinine Ratio 23     EGFR CKD-EPI Non-African American, Male >90 >=60 mL/min/1.54m2    EGFR CKD-EPI African American, Male >90 >=60 mL/min/1.32m2    Glucose 81 70 - 179 mg/dL    Calcium 9.0 8.5 - 04.5 mg/dL    Albumin 4.3 3.5 - 5.0 g/dL    Total Protein 6.5 6.5 - 8.3 g/dL    Total Bilirubin 0.5 0.0 - 1.2 mg/dL    AST 35 19 - 55 U/L    ALT 39 <50 U/L    Alkaline Phosphatase 112 38 - 126 U/L   CBC  Collection Time: 12/28/18  5:24 AM   Result Value Ref Range    WBC 8.3 4.5 - 11.0 10*9/L    RBC 4.32 (L) 4.50 - 5.90 10*12/L    HGB 11.7 (L) 13.5 - 17.5 g/dL    HCT 16.1 (L) 09.6 - 53.0 %    MCV 82.9 80.0 - 100.0 fL    MCH 27.0 26.0 - 34.0 pg MCHC 32.5 31.0 - 37.0 g/dL    RDW 04.5 40.9 - 81.1 %    MPV 7.8 7.0 - 10.0 fL    Platelet 287 150 - 440 10*9/L   POCT Glucose    Collection Time: 12/28/18  5:39 AM   Result Value Ref Range    Glucose, POC 88 70 - 179 mg/dL       Imaging:  Ecg 12 Lead    Result Date: 12/27/2018  NORMAL SINUS RHYTHM NORMAL ECG WHEN COMPARED WITH ECG OF 29-Nov-2018 09:06, PREMATURE VENTRICULAR BEATS ARE NO LONGER PRESENT Confirmed by Warnell Forester ([id number]) on 12/27/2018 10:26:00 AM    Xr Chest Portable    Result Date: 12/27/2018  EXAM: XR CHEST PORTABLE DATE: 12/27/2018 ACCESSION: [id number] UN DICTATED: 12/27/2018 5:53 PM [facility]PRETATION LOCATION: [facility] CLINICAL INDICATION: 58 years old Male with S/p right sided thoracentesis ; OTHER  COMPARISON: Chest x-ray and CTA of the chest from 12/26/2018 TECHNIQUE: Portable Chest Radiograph. FINDINGS: Small bilateral pleural effusions and bibasilar opacities, likely atelectasis. No pneumothorax. Enlarged cardiac silhouette.     Right greater than left small bilateral pleural effusions and bibasilar opacities, likely atelectasis. Cardiomegaly.    Xr Chest 1 View Portable    Result Date: 12/26/2018  EXAM: XR CHEST PORTABLE DATE: 12/26/2018 7:34 PM ACCESSION: [id number] UN DICTATED: 12/26/2018 7:44 PM [facility]PRETATION LOCATION: [facility] CLINICAL INDICATION: 58 years old Male with PNEUMOTHORAX  COMPARISON: 11/29/2018 chest radiograph and prior. TECHNIQUE: Portable Chest Radiograph. FINDINGS: New bibasilar confluent opacities. No pneumothorax. Small right greater than left veiling pleural effusions are new. Partially obscured cardiomediastinal silhouette.     New bibasilar opacities (atelectasis and/or consolidation) and small bilateral pleural effusions. No pneumothorax.    Xr Chest 2 Views    Result Date: 12/26/2018 EXAM: XR CHEST 2 VIEWS DATE: 12/26/2018 8:18 PM ACCESSION: [id number] UN DICTATED: 12/26/2018 8:24 PM [facility]PRETATION LOC[facility] CLINICAL INDICATION: 57 years old Male with SHORTNESS OF BREATH  COMPARISON: 12/26/2018 chest radiograph at 7:23 PM and prior. TECHNIQUE: PA and Lateral Chest Radiographs.     Interval decrease in left basilar opacities. Confluent right basilar opacities are unchanged. No pneumothorax. Right greater than left pleural effusions are unchanged. Partially obscured cardiomediastinal silhouette. IMPRESSION: Interval improvement in left lower lobe aeration. Otherwise unchanged appearance of bilateral pleural effusions and right basilar opacities.    Ct Abdomen Pelvis W Iv Contrast Only    Result Date: 12/27/2018 EXAM: CT ABDOMEN PELVIS W CONTRAST DATE: 12/26/2018 11:44 PM ACCESSION: [id number] UN DICTATED: 12/27/2018 1:02 AM INTERPRETATION [facility]: [facility] CLINICAL INDICATION: concern for abdominal fluid collection  COMPARISON: Concurrent chest CT TECHNIQUE: A helical CT scan of the chest, abdomen, and pelvis was obtained with IV contrast from the thoracic inlet through the pubic symphysis. Images were reconstructed in the axial plane. Coronal and sagittal reformatted images were also provided for further evaluation. FINDINGS: LINES AND TUBES: None. LOWER THORAX: Please see concurrent CT Chest for a detailed report. HEPATOBILIARY: Sequelae of liver transplant. The hepatic veins, portal vein, portal venous confluence, right and left hepatic veins are patent. Cholecystectomy. The common bile duct stent has been removed in  the interval. Loculated subhepatic fluid along the right hepatic lobe is smaller now measuring 2.7 cm, previously 3.7 cm (2:44. The loculated fluid collection within the left hepatic lobe near the falciform ligament also is smaller in the interval measuring 3.5 cm, previously remeasured at 4.5 cm (2:61) fluid and inflammatory changes along the porta hepatis appears relatively similar. No new fluid collections. The liver enhances homogeneously.  SPLEEN: Splenule. Splenomegaly. Otherwise, Unremarkable. PANCREAS: Unremarkable. ADRENALS: Unremarkable. KIDNEYS/URETERS: Symmetric nephrograms.  No hydronephrosis or hydroureter. No radiopaque calculi. BLADDER: Unremarkable. PELVIC/REPRODUCTIVE ORGANS: Unremarkable. GI TRACT: Sequelae of the ascending colon resection and reanastomosis. Colonic diverticulosis. No thickened or dilated loops of bowel. Increased conspicuity of pericolonic inflammation in the region of the hepatic flexure/proximal transverse colon. PERITONEUM/RETROPERITONEUM AND MESENTERY: Moderate pelvic and and mild perihepatic ascites. LYMPH NODES: No enlarged lymph nodes. VESSELS: Normal in caliber.  No significant calcified atherosclerotic disease. BONES AND SOFT TISSUES: Multilevel degenerative disease of the spine.  No concerning soft tissue lesions.  No acute fracture, dislocation, or listhesis.  No aggressive lytic or blastic osseus lesions. Unchanged anterior abdominal wall postsurgical changes.     Worsening pericardial effusion and bilateral pleural effusions with passive atelectasis. Please refer to CT angiography chest. Operative findings of orthotopic liver transplant with decreasing size subhepatic fluid collections detailed above. Persistent stranding and thickening in the region of the porta hepatis may be postsurgical. However early inflammatory hepatic process cannot entirely be excluded. Additionally, increased conspicuity of mesenteric inflammation and stranding along the hepatic flexure proximal transverse colon is seen near the porta hepatis. Interval small volume abdominal and pelvic ascites. Hepatic vasculature and abdominal vasculature appears grossly patent.      Cta Chest W Contrast    Result Date: 12/27/2018 EXAM: CTA CHEST W CONTRAST DATE: 12/26/2018 11:44 PM ACCESSION: [id number] UN DICTATED: 12/27/2018 12:48 AM [facility]PRETATION LOCATION: [facility] CLINICAL INDICATION: 58 years old Male with rule out PE  COMPARISON: Concurrent CT of the abdomen and pelvis. CT chest dated 04/11/2018 TECHNIQUE: A helical CTA scan was obtained with 100 mL IV contrast from the lung apices to the lung bases. Images were reconstructed in the axial plane. MIP images were provided for evaluation of the aorta. Coronal and sagittal reformatted images were also provided. LINES AND TUBES:  None.                AIRWAYS, LUNGS, PLEURA: Clear central tracheobronchial tree.  No lung consolidation.       Moderate right and small left pleural effusions with associated relaxation atelectasis. MEDIASTINUM: Cardiomegaly. Moderate simple pericardial effusion. Normal caliber thoracic aorta.  Subcentimeter mediastinal lymph nodes are most likely reactive. IMAGED ABDOMEN:    SOFT TISSUES: Unremarkable. BONES: Lytic T8 lesion most likely representing benign bone island.     Interval development of moderate right pleural effusion, small left pleural effusion, and moderate pericardial effusion. Negative PE study        Radiology studies were personally reviewed    Cultures:   Microbiology Results (last day)     Procedure Component Value Date/Time Date/Time    Body fluid cell count [[id number]] Collected: 12/27/18 1733    Lab Status: Final result Specimen: Fluid, Pleural Updated: 12/27/18 2105     Fluid Type Fluid, Pleural     Color, Fluid Red     Appearance, Fluid Opaque     Nucleated Cells, Fluid 840 ul      RBC, Fluid 36,500 ul      Neutrophil %, Fluid 7.0 %  Lymphocytes %, Fluid 12.0 %      Mono/Macro % , Fluid 32.0 %      Eosinophils %, Fluid 46.0 %      Basophils %, Fluid 1.0 %      Other Cells %, Fluid 2.0 %      #Cells Counted BF Diff 100     Fluid Comments Macrophages and mesothelial cells present. Body fluid, pathologist review [[id number]] Collected: 12/27/18 1733    Lab Status: In process Specimen: Fluid, Pleural Updated: 12/27/18 2104    Pleural Fluid Culture [[id number]] Collected: 12/27/18 1733    Lab Status: Preliminary result Specimen: Fluid, Pleural from Pleural, right Updated: 12/27/18 2020     Gram Stain Result Direct Specimen Gram Stain      1+ Polymorphonuclear leukocytes      No organisms seen    Narrative:      Specimen Source: Pleural, right    Albumin Level, Body Fluid [[id number]] Collected: 12/27/18 1732    Lab Status: Final result Specimen: Fluid, Pleural Updated: 12/27/18 1756     Albumin, Fluid 2.4 g/dL      Albumin, Fluid Type Fluid, Pleural    Narrative:      The reference range for this body fluid has not been established. The test result must be integrated into the clinical context for interpretation.  This test was developed and its performance characteristics determined by the [facility] of the [employer], [employer]. This test has not been cleared or approved by the FDA. The laboratory is regulated under CAP and CLIA as qualified to perform high-complexity testing. This test is to be used for clinical purposes and should not be regarded as investigational or for research.      Cholesterol, Body Fluid [[id number]] Collected: 12/27/18 1732    Lab Status: Final result Specimen: Fluid, Pleural Updated: 12/27/18 1756     Cholesterol, Fluid 52 mg/dL      Chol, Fluid Type Fluid, Pleural    Narrative:      The reference range for this body fluid has not been established. The test result must be integrated into the clinical context for interpretation. This test was developed and its performance characteristics determined by the [facility] of the [employer] [employer]. This test has not been cleared or approved by the FDA. The laboratory is regulated under CAP and CLIA as qualified to perform high-complexity testing. This test is to be used for clinical purposes and should not be regarded as investigational or for research.      Lactate Dehydrogenase, Body Fluid [[id number]] Collected: 12/27/18 1732    Lab Status: Final result Specimen: Fluid, Pleural Updated: 12/27/18 1756     LDH, Fluid 775 U/L      LD, Fluid Type Fluid, Pleural    Narrative:      The reference range for this body fluid has not been established. The test result must be integrated into the clinical context for interpretation.  This test was developed and its performance characteristics determined by the [facility] of the [employer] [employer]. This test has not been cleared or approved by the FDA. The laboratory is regulated under CAP and CLIA as qualified to perform high-complexity testing. This test is to be used for clinical purposes and should not be regarded as investigational or for research.      Protein, Body Fluid [[id number]] Collected: 12/27/18 1732    Lab Status: Final result Specimen: Fluid, Pleural Updated: 12/27/18 1756  Protein, Fluid 4.2 g/dL      Protein, Fluid Type Fluid, Pleural    Narrative:      The reference range for this body fluid has not been established. The test result must be integrated into the clinical context for interpretation. This test was developed and its performance characteristics determined by the [facility] of the [employer] [employer]. This test has not been cleared or approved by the FDA. The laboratory is regulated under CAP and CLIA as qualified to perform high-complexity testing. This test is to be used for clinical purposes and should not be regarded as investigational or for research.            Scheduled Meds:  ??? amLODIPine  10 mg Oral Daily   ??? aspirin  81 mg Oral Daily   ??? fluticasone propionate  2 spray Each Nare Daily   ??? gabapentin  300 mg Oral BID   ??? heparin (porcine) for subcutaneous use  5,000 Units Subcutaneous [facility]   ??? insulin regular  0-12 Units Subcutaneous ACHS   ??? levothyroxine  137 mcg Oral Daily   ??? mycophenolate  360 mg Oral BID   ??? sertraline  25 mg Oral Daily   ??? sulfamethoxazole-trimethoprim  1 tablet Oral 3x weekly   ??? tacrolimus  2 mg Oral BID   ??? tiZANidine  2 mg Oral TID       Continuous Infusions:  ??? sodium chloride 50 mL/hr (12/28/18 0442)       PRN Meds:acetaminophen, calcium carbonate, dextrose 50 % in water (D50W), ondansetron, oxyCODONE

## 2018-12-28 NOTE — Unmapped (Signed)
Discharge Summary    Admit date: 12/26/2018    Discharge date and time: 12/26/2018    Discharge to:  Home    Discharge Service: Surg Transplant [facility])    Discharge Attending Physician: Doyce Loose,*    Discharge  Diagnoses: Pleural effusion    Secondary Diagnosis: Active Problems:    * No active hospital problems. *  Resolved Problems:    * No resolved hospital problems. *      OR Procedures:  None     Ancillary Procedures: thoracentesis   THORACENTESIS W/ IMAGING  Date  12/27/2018  -------------------      Discharge Day Services:     Subjective   No acute events overnight. Pain Controlled. No fever or chills. SOB greatly improved.    Objective   Patient Vitals for the past 8 hrs:   BP Temp Temp src Pulse Resp SpO2   12/28/18 1110 117/74 36.7 ??C Oral 77 18 97 %     I/O this shift:  In: 1740 [P.O.:840; I.V.:900]  Out: 425 [Urine:425]    General Appearance:   No acute distress  Lungs:                Clear to auscultation bilaterally, normal effort on RA  Heart:                           Regular rate and rhythm  Abdomen:                Soft, non-tender, non-distended  Extremities:              Warm and well perfused      Hospital Course:  Anthony Hahn is a 59 y.o. male with history of cryptogenic cirrhosis and hepatocellular carcinoma s/p OLT 09/16/2018, aortic valve stenosis, type 2 diabetes, autoimmune cholangitis with recent drainage of intra-abdominal fluid collection by VIR  that was admitted to the hospital on 12/26/2018 for similar SOB. CT with ascites, pleural and pericardial effusions. He underwent thoracentesis with interventional Pulmonology on 12/27/2018 with 1L of fluid being drained and sent for cultures, cytology and fluid studies.  He tolerated the procedure well, and had improved respiratory status following. His SOB resolved11/4/20 and CXR revealed minimal effusion. He did well post procedure. His diet was advanced to regular, and at the time of discharge he was tolerating a regular diet. The patient was able to void spontaneously, have his pain controlled with P.O. pain medication, and ambulate with minimal assistance. He was breathing easily on room air.    He is being discharged on 12/28/18 to home in stable condition with planned outpatient follow-up.    Consults: Interventional Pulmonology        Condition at Discharge: Improved  Discharge Medications:      Medication List      START taking these medications    ??? oxyCODONE-acetaminophen 5-325 mg per tablet; Commonly known as:   PERCOCET; Take 1 tablet by mouth every eight (8) hours as needed for pain   for up to 3 days.     CHANGE how you take these medications    ??? alendronate 70 MG tablet; Commonly known as: FOSAMAX; Take 1 tablet (70   mg total) by mouth every seven (7) days.; What changed: additional   instructions  ??? ergocalciferol 1,250 mcg (50,000 unit) capsule; Commonly known as:   DRISDOL; Take 1 capsule (50,000 Units total) by mouth once a week for 4  doses.; What changed: additional instructions     CONTINUE taking these medications    ??? acetaminophen 325 MG tablet; Commonly known as: TylenoL; Take 2 tablets   (650 mg total) by mouth every six (6) hours as needed for pain or fever (>   38C or 100.65F).  ??? amLODIPine 5 MG tablet; Commonly known as: NORVASC; Take 2 tablets (10   mg total) by mouth daily.  ??? aspirin 81 MG tablet; Commonly known as: ECOTRIN; Take 1 tablet (81 mg   total) by mouth daily.  ??? azelastine 137 mcg (0.1 %) nasal spray; Commonly known as: ASTELIN  ??? blood sugar diagnostic Strp; Use as directed Three (3) times a day   before meals.  ??? calcium carbonate 200 mg calcium (500 mg) chewable tablet; Commonly   known as: TUMS  ??? EUCRISA 2 % Oint; Generic drug: crisaborole  ??? fluticasone propionate 50 mcg/actuation nasal spray; Commonly known as:   FLONASE ??? gabapentin 300 MG capsule; Commonly known as: NEURONTIN; Take 1 capsule   (300 mg total) by mouth two (2) times a day.  ??? lancets 33 gauge Misc; 1 each by Miscellaneous route Three (3) times a   day before meals.  ??? levothyroxine 150 MCG tablet; Commonly known as: SYNTHROID; Take 1   tablet (150 mcg total) by mouth daily.  ??? magnesium (amino acid chelate) 133 mg Tab; Generic drug: magnesium   oxide-Mg AA chelate; Take 1 tablet by mouth Two (2) times a day. HOLD   until directed to start by your coordinator.  ??? mycophenolate 180 MG EC tablet; Commonly known as: MYFORTIC; Take 2   tablets (360 mg total) by mouth Two (2) times a day.  ??? oxyCODONE 5 MG immediate release tablet; Commonly known as: ROXICODONE;   Take 1-2 tablets (5-10 mg total) by mouth every six (6) hours as needed   for pain.  ??? sertraline 25 MG tablet; Commonly known as: ZOLOFT; Take 1 tablet (25 mg   total) by mouth daily.  ??? sulfamethoxazole-trimethoprim 400-80 mg per tablet; Commonly known as:   BACTRIM; Take 1 tablet (80 mg of trimethoprim total) by mouth 3 (three)   times a week.  ??? tacrolimus 0.5 MG capsule; Commonly known as: PROGRAF; Take 4 capsules   (2 mg total) by mouth two (2) times a day.  ??? TiZANidine 2 MG capsule; Commonly known as: ZANAFLEX       Pending Test Results:   Thoracentesis fluid studies    Discharge Instructions:  Activity: As tolerated    Diet: As tolerated    Other Instructions: Follow up with pulmonology regarding thoracentesis studies  Other Instructions     Discharge instructions      Activity: Regular activity as tolerated.    Diet: Regular    Other Instructions: Follow up with pulmonology regarding thoracentesis studies    Contact your transplant coordinator or the Transplant Surgery Office [phone]) during business hours or page the transplant coordinator on call [phone])/ SRF resident (transplant surgery resident) on call [phone])  after business hours for: - fever >100.5 degrees F by mouth, any fever with shaking chills, or other signs or symptoms of infection   - uncontrolled nausea, vomiting, or diarrhea; inability to have a bowel movement for > 3 days.   - any problem that prevents taking medications as scheduled.   - pain uncontrolled with prescribed medication or new pain or tenderness at the surgical site   - sudden weight gain or increase in blood pressure (  greater than 140/85)   - shortness of breath, chest pain / discomfort   - new or increasing jaundice   - urinary symptoms including pain / difficulty / burning or tea-colored urine   - any other new or concerning symptoms   - questions regarding your medications or continuing care      Patient may shower, but should not immerse wounds in bath or pool for 2-3 weeks. Wash the surgical site with mild soap and water, but do not scrub vigorously.    You may dress wounds with dry gauze and tape to avoid soilage.    Do not drive or operate heavy machinery at any time while taking narcotics.    Inspect surgical sites at least twice daily, contact Transplant Coordinator for spreading redness, purulent discharge, or increasing bleeding or drainage, or for separation of wounds.     Maintain a written record of daily vital signs, per Handbook instructions.     Maintain a written record of medications taken and review against the discharge medications sheet (orange paper). Periodically review your Transplant Handbook for important information regarding postoperative care and required precautions.      F/u:  -you will be seen by hepatology and Pulmonology in follow up. They will reach out to you to establish appointments.     Transplant Coordinator:  Esmond Harps- phone: [phone] fax: [phone]  Emilio Math- phone: [phone] fax: [phone]  Patsey Berthold- phone: [phone] fax: [phone]  Celine Ahr- phone: [phone] fax: [phone] [facility] Mariea Stable- phone: [phone] fax: [phone]      Transplant Coordinator:  Cecil Cranker- phone: [phone] fax: [phone]  Daphene Jaeger- phone: [phone] fax: [phone]  Sinda Du- phone: [phone] fax: [phone]  Margaretha Glassing- phone: [phone] fax: [phone]  [facility]- phone: [phone] fax: [phone]  Philis Nettle- phone: [phone] fax: [phone]    Donor Coordinator:  Antonieta Iba- phone: [phone] fax: [phone]    Hepatobiliary Coordinator:  Newton Pigg- phone: [phone] fax: [phone]    Vascular Surgery Coordinator:  Alberteen Spindle- phone: [phone] fax: [phone]             Labs and Other Follow-ups after Discharge: Per pulmonology  Follow Up instructions and Outpatient Referrals     Discharge instructions            Future Appointments:  Appointments which have been scheduled for you    Jan 24, 2019  9:00 AM  (Arrive by 8:30 AM)  NEW GENERAL with [facility] INTERVENTIONAL PULMONOLOGY  [place] [facility]COL[facility] MULTIDISCIPLINARY 2ND FLR CANCER HOSP [facility] REGION) [address]  [place] [address]  [phone]      Feb 01, 2019 10:00 AM  (Arrive by 9:30 AM)  NEW  NON PROCEDURAL with Kathie Dike, MD  [facility] PAIN MANAGEMENT CENTER [address] [place] [facility] REGION) [address]  [place] [address]  [phone]      Mar 20, 2019  8:30 AM  (Arrive by 8:00 AM)  VIDEO VISIT- OTHER with Adalberto Cole, PhD  [facility] TRANSPLANT SURGERY [place] [facility] REGION) [address]  [place] [address]  [phone]      Mar 20, 2019 10:20 AM  (Arrive by 9:50 AM)  RETURN PHARMD with Fleeta Emmer, CPP  [facility] TRANSPLANT SURGERY [place] [facility] REGION) [address]  [place] [address]  [phone]      Mar 20, 2019 11:00 AM  (Arrive by 10:30 AM)  VIDEO  VISIT- OTHER with Lanelle Bal, RD/LDN [facility] NUTRITION SERVICES TRANSPLANT [place] [facility] REGION) [address]  [place] [address]  [phone]      Mar 20, 2019  1:45 PM  (Arrive by 1:15 PM)  RETURN 15 with Chirag Lanney Gins, MD  [facility] TRANSPLANT SURGERY [place] [facility] REGION) [address]  [place] [address]  [phone]      Jun 19, 2019 12:45 PM  (Arrive by 12:15 PM)  RETURN 15 with Chirag Lanney Gins, MD  [facility] TRANSPLANT SURGERY [place] [facility] REGION) [address]  [place] [address]  [phone]      Jun 19, 2019  1:00 PM  (Arrive by 12:30 PM)  RETURN PHARMD with Fleeta Emmer, CPP  [facility] TRANSPLANT SURGERY [place] [facility] REGION) [address]  [place] [address]  [phone]

## 2018-12-28 NOTE — Unmapped (Signed)
Review POC with patient. VSS. Urine output remains adequate voiding. Pt tolerating regular diet without complaint of nausea or vomiting. Pain controlled and no request for pain meds. Pt complains of increasing soreness in abdomen. Pt ambulates independently  without use of assistive device. Albumin and lasix administered per order. No pt questions or concerns at this time.     Problem: Adult Inpatient Plan of Care  Goal: Plan of Care Review  Outcome: Progressing  Goal: Patient-Specific Goal (Individualization)  Outcome: Progressing  Goal: Absence of Hospital-Acquired Illness or Injury  Outcome: Progressing  Goal: Optimal Comfort and Wellbeing  Outcome: Progressing  Goal: Readiness for Transition of Care  Outcome: Progressing  Goal: Rounds/Family Conference  Outcome: Progressing

## 2018-12-28 NOTE — Unmapped (Signed)
SURGERY PROGRESS NOTE    Service Date: 12/27/2018  Admit Date: 12/26/2018, Hospital Day: 2  Hospital Service: Surg Transplant [facility])  Attending: Doyce Loose,*    Assessment     Anthony Alvarado is a 58 y.o. male with h/o male with cryptogenic cirrhosis and hepatocellular carcinoma s/p OLT 09/16/2018, aortic valve stenosis, type 2 diabetes, autoimmune cholangitis with recent drainage of intra-abdominal fluid collection by VIR who presents with similar SOB. CT with ascites, pleural and pericardial effusions.  Now s/p thoracentesis 12/27/2018 with 1L output.    Plan   Fluids: NS @50cc /hr  UO: appropriate  Diet: regular  DVT: heparin (subcutaneous)  Lines: None    Neuro: pain well-controlled.     CV: HDS   -Diuresis with 40mg  lasix x2 following albumin bolus x2    Pulm: stable on Room AIR. IS. OOB tid  *Dyspnea in setting of persistent R sided pleural effusion  - OOB to chair TID, Incentive spirometry and inpatient consult to Interventional Pulmonology, Recs appreciated  -S/p right sided thoracentesis with 950 ml of serosanguineous fluid drained.??  -Fluid studies are consistent with an exudative effusion. Differential remains pending. Etiologies can include infectious versus movement of liquid from abdomen to the pleural space, given recent drainage of abdominal fluid collection     ??- No airspace consolidation identified on imaging therefore it is unlikely that this is a parapneumonic process     FEN/GI: Regular diet. Continue 0.9NS at 50 mL/hr.   *Nausea Zofran PRN  *Electrolytes: Replete PRN  *GI Ppx: None    GU: Adequate UOP. Cr 0.89 on admission.    *Foley: Voiding spontaneously    Heme/ID: Afebrile. No leukocytosis..     Endo: Hx DM2    Disposition: Floor status.     -----  Carolynn Sayers, MD  General Surgery PGY1      Subjective   No acute events overnight. Pain Controlled. No fever or chills.     Objective     Vitals:   Temp:  [36.5 ??C-36.9 ??C] 36.5 ??C  Heart Rate:  [76-89] 80  SpO2 Pulse:  [89] 89 Resp:  [16-32] 16  BP: (92-148)/(61-91) 92/71  MAP (mmHg):  [87-103] 91  SpO2:  [94 %-96 %] 95 %  BMI (Calculated):  [26.46] 26.46    Intake/Output last 24 hours:  I/O last 3 completed shifts:  In: 0   Out: 1050 [Urine:1050]    Physical Exam:  -General:  Appropriate, comfortable and in no apparent distress.   -Neurological: Moves all 4 extremities spontaneously.   -Cardiovascular: Regular rate and rhythm.  -Pulmonary: Normal work of breathing on RA.   -Abdomen: Soft, non-tender, non-distended. No rebound or guarding.   -Extremities: Warm, well perfused.     Labs:  All lab results last 24 hours:    Recent Results (from the past 24 hour(s))   Magnesium Level    Collection Time: 12/27/18  5:03 AM   Result Value Ref Range    Magnesium 1.5 (L) 1.6 - 2.2 mg/dL   Phosphorus Level    Collection Time: 12/27/18  5:03 AM   Result Value Ref Range    Phosphorus 3.8 2.9 - 4.7 mg/dL   Comprehensive metabolic panel    Collection Time: 12/27/18  5:03 AM   Result Value Ref Range    Sodium 139 135 - 145 mmol/L    Potassium 4.5 3.5 - 5.0 mmol/L    Chloride 106 98 - 107 mmol/L    Anion Gap 8 7 - 15  mmol/L    CO2 25.0 22.0 - 30.0 mmol/L    BUN 20 7 - 21 mg/dL    Creatinine 1.61 0.96 - 1.30 mg/dL    BUN/Creatinine Ratio 22     EGFR CKD-EPI Non-African American, Male >90 >=60 mL/min/1.71m2    EGFR CKD-EPI African American, Male >90 >=60 mL/min/1.36m2    Glucose 97 70 - 179 mg/dL    Calcium 8.9 8.5 - 04.5 mg/dL    Albumin 3.5 3.5 - 5.0 g/dL    Total Protein 6.1 (L) 6.5 - 8.3 g/dL    Total Bilirubin 0.5 0.0 - 1.2 mg/dL    AST 61 (H) 19 - 55 U/L    ALT 34 <50 U/L    Alkaline Phosphatase 107 38 - 126 U/L   CBC    Collection Time: 12/27/18  5:03 AM   Result Value Ref Range    WBC 11.0 4.5 - 11.0 10*9/L    RBC 4.29 (L) 4.50 - 5.90 10*12/L    HGB 11.4 (L) 13.5 - 17.5 g/dL    HCT 40.9 (L) 81.1 - 53.0 %    MCV 84.1 80.0 - 100.0 fL    MCH 26.5 26.0 - 34.0 pg    MCHC 31.5 31.0 - 37.0 g/dL    RDW 91.4 78.2 - 95.6 %    MPV 8.3 7.0 - 10.0 fL Platelet 292 150 - 440 10*9/L   Tacrolimus Level, Trough    Collection Time: 12/27/18  5:03 AM   Result Value Ref Range    Tacrolimus, Trough 11.0 5.0 - 15.0 ng/mL   POCT Glucose    Collection Time: 12/27/18  7:29 AM   Result Value Ref Range    Glucose, POC 88 70 - 179 mg/dL   POCT Glucose    Collection Time: 12/27/18 10:57 AM   Result Value Ref Range    Glucose, POC 102 70 - 179 mg/dL   Glucose, Body Fluid    Collection Time: 12/27/18  5:32 PM   Result Value Ref Range    Glucose, Fluid 89 mg/dL    Glucose, Fluid Type Fluid, Pleural    Albumin Level, Body Fluid    Collection Time: 12/27/18  5:32 PM   Result Value Ref Range    Albumin, Fluid 2.4 g/dL    Albumin, Fluid Type Fluid, Pleural    Cholesterol, Body Fluid    Collection Time: 12/27/18  5:32 PM   Result Value Ref Range    Cholesterol, Fluid 52 mg/dL    Chol, Fluid Type Fluid, Pleural    Lactate Dehydrogenase, Body Fluid    Collection Time: 12/27/18  5:32 PM   Result Value Ref Range    LDH, Fluid 775 U/L    LD, Fluid Type Fluid, Pleural    Protein, Body Fluid    Collection Time: 12/27/18  5:32 PM   Result Value Ref Range    Protein, Fluid 4.2 g/dL    Protein, Fluid Type Fluid, Pleural    Body fluid cell count    Collection Time: 12/27/18  5:33 PM   Result Value Ref Range    Fluid Type Fluid, Pleural     Color, Fluid Red     Appearance, Fluid Opaque     Nucleated Cells, Fluid 840 Undefined ul    RBC, Fluid 36,500 ul    Neutrophil %, Fluid 7.0 %    Lymphocytes %, Fluid 12.0 %    Mono/Macro % , Fluid 32.0 %    Eosinophils %, Fluid 46.0 %  Basophils %, Fluid 1.0 %    Other Cells %, Fluid 2.0 %    #Cells Counted BF Diff 100     Fluid Comments Macrophages and mesothelial cells present.      Pleural Fluid Culture    Collection Time: 12/27/18  5:33 PM    Specimen: Pleural, right; Fluid, Pleural   Result Value Ref Range    Gram Stain Result Direct Specimen Gram Stain     Gram Stain Result 1+ Polymorphonuclear leukocytes     Gram Stain Result No organisms seen POCT Glucose    Collection Time: 12/27/18  5:55 PM   Result Value Ref Range    Glucose, POC 70 70 - 179 mg/dL   POCT Glucose    Collection Time: 12/27/18  9:00 PM   Result Value Ref Range    Glucose, POC 207 (H) 70 - 179 mg/dL       Imaging:  Ecg 12 Lead    Result Date: 12/27/2018  NORMAL SINUS RHYTHM NORMAL ECG WHEN COMPARED WITH ECG OF 29-Nov-2018 09:06, PREMATURE VENTRICULAR BEATS ARE NO LONGER PRESENT Confirmed by Warnell Forester ([id number]) on 12/27/2018 10:26:00 AM    Xr Chest Portable    Result Date: 12/27/2018  EXAM: XR CHEST PORTABLE DATE: 12/27/2018 ACCESSION: [id number] UN DICTATED: 12/27/2018 5:53 PM [facility]PRETATION LOCATION: [facility] CLINICAL INDICATION: 58 years old Male with S/p right sided thoracentesis ; OTHER  COMPARISON: Chest x-ray and CTA of the chest from 12/26/2018 TECHNIQUE: Portable Chest Radiograph. FINDINGS: Small bilateral pleural effusions and bibasilar opacities, likely atelectasis. No pneumothorax. Enlarged cardiac silhouette.     Right greater than left small bilateral pleural effusions and bibasilar opacities, likely atelectasis. Cardiomegaly.    Xr Chest 1 View Portable    Result Date: 12/26/2018  EXAM: XR CHEST PORTABLE DATE: 12/26/2018 7:34 PM ACCESSION: [id number] UN DICTATED: 12/26/2018 7:44 PM [facility]PRETATION LOCATION: [facility] CLINICAL INDICATION: 58 years old Male with PNEUMOTHORAX  COMPARISON: 11/29/2018 chest radiograph and prior. TECHNIQUE: Portable Chest Radiograph. FINDINGS: New bibasilar confluent opacities. No pneumothorax. Small right greater than left veiling pleural effusions are new. Partially obscured cardiomediastinal silhouette.     New bibasilar opacities (atelectasis and/or consolidation) and small bilateral pleural effusions. No pneumothorax.    Xr Chest 2 Views    Result Date: 12/26/2018 EXAM: XR CHEST 2 VIEWS DATE: 12/26/2018 8:18 PM ACCESSION: [id number] UN DICTATED: 12/26/2018 8:24 PM [facility]PRETATION LOCATION: [facility] CLINICAL INDICATION: 58 years old Male with SHORTNESS OF BREATH  COMPARISON: 12/26/2018 chest radiograph at 7:23 PM and prior. TECHNIQUE: PA and Lateral Chest Radiographs.     Interval decrease in left basilar opacities. Confluent right basilar opacities are unchanged. No pneumothorax. Right greater than left pleural effusions are unchanged. Partially obscured cardiomediastinal silhouette. IMPRESSION: Interval improvement in left lower lobe aeration. Otherwise unchanged appearance of bilateral pleural effusions and right basilar opacities.    Ct Abdomen Pelvis W Iv Contrast Only    Result Date: 12/27/2018 EXAM: CT ABDOMEN PELVIS W CONTRAST DATE: 12/26/2018 11:44 PM ACCESSION: [id number] UN DICTATED: 12/27/2018 1:02 AM INTERPRETATION [facility]ATION: [facility] CLINICAL INDICATION: concern for abdominal fluid collection  COMPARISON: Concurrent chest CT TECHNIQUE: A helical CT scan of the chest, abdomen, and pelvis was obtained with IV contrast from the thoracic inlet through the pubic symphysis. Images were reconstructed in the axial plane. Coronal and sagittal reformatted images were also provided for further evaluation. FINDINGS: LINES AND TUBES: None. LOWER THORAX: Please see concurrent CT Chest for a detailed report. HEPATOBILIARY: Sequelae of liver transplant. The hepatic veins, portal vein,  portal venous confluence, right and left hepatic veins are patent. Cholecystectomy. The common bile duct stent has been removed in the interval. Loculated subhepatic fluid along the right hepatic lobe is smaller now measuring 2.7 cm, previously 3.7 cm (2:44. The loculated fluid collection within the left hepatic lobe near the falciform ligament also is smaller in the interval measuring 3.5 cm, previously remeasured at 4.5 cm (2:61) fluid and inflammatory changes along the porta hepatis appears relatively similar. No new fluid collections. The liver enhances homogeneously.  SPLEEN: Splenule. Splenomegaly. Otherwise, Unremarkable. PANCREAS: Unremarkable. ADRENALS: Unremarkable. KIDNEYS/URETERS: Symmetric nephrograms.  No hydronephrosis or hydroureter. No radiopaque calculi. BLADDER: Unremarkable. PELVIC/REPRODUCTIVE ORGANS: Unremarkable. GI TRACT: Sequelae of the ascending colon resection and reanastomosis. Colonic diverticulosis. No thickened or dilated loops of bowel. Increased conspicuity of pericolonic inflammation in the region of the hepatic flexure/proximal transverse colon. PERITONEUM/RETROPERITONEUM AND MESENTERY: Moderate pelvic and and mild perihepatic ascites. LYMPH NODES: No enlarged lymph nodes. VESSELS: Normal in caliber.  No significant calcified atherosclerotic disease. BONES AND SOFT TISSUES: Multilevel degenerative disease of the spine.  No concerning soft tissue lesions.  No acute fracture, dislocation, or listhesis.  No aggressive lytic or blastic osseus lesions. Unchanged anterior abdominal wall postsurgical changes.     Worsening pericardial effusion and bilateral pleural effusions with passive atelectasis. Please refer to CT angiography chest. Operative findings of orthotopic liver transplant with decreasing size subhepatic fluid collections detailed above. Persistent stranding and thickening in the region of the porta hepatis may be postsurgical. However early inflammatory hepatic process cannot entirely be excluded. Additionally, increased conspicuity of mesenteric inflammation and stranding along the hepatic flexure proximal transverse colon is seen near the porta hepatis. Interval small volume abdominal and pelvic ascites. Hepatic vasculature and abdominal vasculature appears grossly patent.      Cta Chest W Contrast    Result Date: 12/27/2018 EXAM: CTA CHEST W CONTRAST DATE: 12/26/2018 11:44 PM ACCESSION: [id number] UN DICTATED: 12/27/2018 12:48 AM INTERPRETATION LOCATION: [facility] CLINICAL INDICATION: 58 years old Male with rule out PE  COMPARISON: Concurrent CT of the abdomen and pelvis. CT chest dated 04/11/2018 TECHNIQUE: A helical CTA scan was obtained with 100 mL IV contrast from the lung apices to the lung bases. Images were reconstructed in the axial plane. MIP images were provided for evaluation of the aorta. Coronal and sagittal reformatted images were also provided. LINES AND TUBES:  None.                AIRWAYS, LUNGS, PLEURA: Clear central tracheobronchial tree.  No lung consolidation.       Moderate right and small left pleural effusions with associated relaxation atelectasis. MEDIASTINUM: Cardiomegaly. Moderate simple pericardial effusion. Normal caliber thoracic aorta.  Subcentimeter mediastinal lymph nodes are most likely reactive. IMAGED ABDOMEN:    SOFT TISSUES: Unremarkable. BONES: Lytic T8 lesion most likely representing benign bone island.     Interval development of moderate right pleural effusion, small left pleural effusion, and moderate pericardial effusion. Negative PE study        Radiology studies were personally reviewed    Cultures:   Microbiology Results (last day)     Procedure Component Value Date/Time Date/Time    Body fluid cell count [id number]] Collected: 12/27/18 1733    Lab Status: Final result Specimen: Fluid, Pleural Updated: 12/27/18 2105     Fluid Type Fluid, Pleural     Color, Fluid Red     Appearance, Fluid Opaque     Nucleated Cells, Fluid 840 ul  RBC, Fluid 36,500 ul      Neutrophil %, Fluid 7.0 %      Lymphocytes %, Fluid 12.0 %      Mono/Macro % , Fluid 32.0 %      Eosinophils %, Fluid 46.0 %      Basophils %, Fluid 1.0 %      Other Cells %, Fluid 2.0 %      #Cells Counted BF Diff 100     Fluid Comments Macrophages and mesothelial cells present. Body fluid, pathologist review [[id number]] Collected: 12/27/18 1733    Lab Status: In process Specimen: Fluid, Pleural Updated: 12/27/18 2104    Pleural Fluid Culture [[id number]] Collected: 12/27/18 1733    Lab Status: Preliminary result Specimen: Fluid, Pleural from Pleural, right Updated: 12/27/18 2020     Gram Stain Result Direct Specimen Gram Stain      1+ Polymorphonuclear leukocytes      No organisms seen    Narrative:      Specimen Source: Pleural, right    Albumin Level, Body Fluid [[id number]] Collected: 12/27/18 1732    Lab Status: Final result Specimen: Fluid, Pleural Updated: 12/27/18 1756     Albumin, Fluid 2.4 g/dL      Albumin, Fluid Type Fluid, Pleural    Narrative:      The reference range for this body fluid has not been established. The test result must be integrated into the clinical context for interpretation.  This test was developed and its performance characteristics determined by the [facility] of the [employer], [employer]. This test has not been cleared or approved by the FDA. The laboratory is regulated under CAP and CLIA as qualified to perform high-complexity testing. This test is to be used for clinical purposes and should not be regarded as investigational or for research.      Cholesterol, Body Fluid [[id number]] Collected: 12/27/18 1732    Lab Status: Final result Specimen: Fluid, Pleural Updated: 12/27/18 1756     Cholesterol, Fluid 52 mg/dL      Chol, Fluid Type Fluid, Pleural    Narrative:      The reference range for this body fluid has not been established. The test result must be integrated into the clinical context for interpretation. This test was developed and its performance characteristics determined by the [facility] of the [employer] [employer]. This test has not been cleared or approved by the FDA. The laboratory is regulated under CAP and CLIA as qualified to perform high-complexity testing. This test is to be used for clinical purposes and should not be regarded as investigational or for research.      Lactate Dehydrogenase, Body Fluid [[id number]] Collected: 12/27/18 1732    Lab Status: Final result Specimen: Fluid, Pleural Updated: 12/27/18 1756     LDH, Fluid 775 U/L      LD, Fluid Type Fluid, Pleural    Narrative:      The reference range for this body fluid has not been established. The test result must be integrated into the clinical context for interpretation.  This test was developed and its performance characteristics determined by the [facility] of the [employer] [employer]. This test has not been cleared or approved by the FDA. The laboratory is regulated under CAP and CLIA as qualified to perform high-complexity testing. This test is to be used for clinical purposes and should not be regarded as investigational or for research.      Protein, Body  Fluid [[id number]] Collected: 12/27/18 1732    Lab Status: Final result Specimen: Fluid, Pleural Updated: 12/27/18 1756     Protein, Fluid 4.2 g/dL      Protein, Fluid Type Fluid, Pleural    Narrative:      The reference range for this body fluid has not been established. The test result must be integrated into the clinical context for interpretation. This test was developed and its performance characteristics determined by the [facility] of the [employer] [employer]. This test has not been cleared or approved by the FDA. The laboratory is regulated under CAP and CLIA as qualified to perform high-complexity testing. This test is to be used for clinical purposes and should not be regarded as investigational or for research.            Scheduled Meds:  ??? amLODIPine  10 mg Oral Daily   ??? aspirin  81 mg Oral Daily   ??? fluticasone propionate  2 spray Each Nare Daily   ??? gabapentin  300 mg Oral BID   ??? heparin (porcine) for subcutaneous use  5,000 Units Subcutaneous [facility]   ??? insulin regular  0-12 Units Subcutaneous ACHS   ??? levothyroxine  137 mcg Oral Daily   ??? mycophenolate  360 mg Oral BID   ??? sertraline  25 mg Oral Daily   ??? sulfamethoxazole-trimethoprim  1 tablet Oral 3x weekly   ??? tacrolimus  2 mg Oral BID   ??? tiZANidine  2 mg Oral TID       Continuous Infusions:  ??? sodium chloride 50 mL/hr (12/27/18 1225)       PRN Meds:acetaminophen, calcium carbonate, dextrose 50 % in water (D50W), ondansetron, oxyCODONE

## 2018-12-28 NOTE — Unmapped (Signed)
PHYSICAL THERAPY  Evaluation (12/28/18 1315)     Patient Name:  Anthony Alvarado       Medical Record Number: [id number]   Date of Birth: Jun 11, 1960  Sex: Male            Treatment Diagnosis: Eval only    ASSESSMENT  Problem List: Decreased endurance     Assessment : Anthony Alvarado is a 58 y.o. male with h/o male with cryptogenic cirrhosis and hepatocellular carcinoma s/p OLT 09/16/2018, aortic valve stenosis, type 2 diabetes, autoimmune cholangitis with recent drainage of intra-abdominal fluid collection by VIR who presents with similar SOB. CT with ascites, pleural and pericardial effusions.  Now s/p thoracentesis 12/27/2018 with 1L output. Pt indep with all mobility without device. Pt with much improved endurance compared to recent baseline, stating he feels much better after thoracentesis. Pt still with slight endurance deficits, becoming fatigued with long-distance ambulation - no deficits impacting safety for return home. Based on the AM-PAC 6 item raw score of 24/24, the patient is considered to be 0% impaired with basic mobility. Pt is appropriate for d/c from PT perspective at this time. After a review of the personal factors, comorbidities, clinical presentation, and examination of the number of affected body systems, the patient presents as a low complexity case.     Today's Interventions: AMPAC 24/24, eval, education on role of PT, importance of upright and progressing mobility to improve endurance, use of IS                          PLAN  Planned Frequency of Treatment:  D/C Services for: D/C Services      Post-Discharge Physical Therapy Recommendations:  PT services not indicated    PT DME Recommendations: None           Goals:   Patient and Family Goals: Go home                     Prognosis:  Excellent  Positive Indicators: PLOF  Barriers to Discharge: None    SUBJECTIVE  Patient reports: Agrees to PT  Current Functional Status: Pt found supine, left up in chair with all needs in reach - RN aware Prior Functional Status: Independent with all mobility  Equipment available at home: Gilmer Mor, Agricultural consultant, Air traffic controller chair     Past Medical History:   Diagnosis Date   ??? Aortic valve stenosis    ??? Autoimmune cholangitis    ??? 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 Cholestatic cirrhosis (CMS-HCC)    ??? Hepatic encephalopathy (CMS-HCC)    ??? Hypertension    ??? Sleep apnea    ??? Type 2 diabetes mellitus (CMS-HCC)     Social History     Tobacco Use   ??? Smoking status: Never Smoker   ??? Smokeless tobacco: Never Used   Substance Use Topics   ??? Alcohol use: Not Currently      Past Surgical History:   Procedure Laterality Date   ??? APPENDECTOMY     ??? CHG US GUIDE, TISSUE ABLATION N/A 05/03/2018    Procedure: ULTRASOUND GUIDANCE FOR, AND MONITORING OF, PARENCHYMAL TISSUE ABLATION;  Surgeon: Particia Nearing, MD;  Location: MAIN OR [facility];  Service: Transplant   ??? PR CATH PLACE/CORON ANGIO, IMG SUPER/INTERP,R&L HRT CATH, L HRT VENTRIC N/A 03/21/2018    Procedure: Left/Right Heart Catheterization;  Surgeon: Neal Dy, MD;  Location: [facility] CATH;  Service: Cardiology   ??? PR LAP,ABLAT 1+ LIVER TUMOR(S),RADIOFREQ N/A 05/03/2018    Procedure: LAPAROSCOPY, SURGICAL, ABLATION OF 1 OR MORE LIVER TUMOR(S); RADIOFREQUENCY;  Surgeon: Particia Nearing, MD;  Location: MAIN OR [facility];  Service: Transplant   ??? PR TRANSPLANT LIVER,ALLOTRANSPLANT Bilateral 09/16/2018    Procedure: LIVER ALLOTRANSPLANTATION; ORTHOTOPIC, PARTIAL OR WHOLE, FROM CADAVER OR LIVING DONOR, ANY AGE;  Surgeon: Florene Glen, MD;  Location: [facility];  Service: Transplant   ??? PR TRANSPLANT,PREP DONOR LIVER, WHOLE N/A 09/16/2018    Procedure: [facility] STD PREP CAD DONOR WHOLE LIVER GFT PRIOR TNSPLNT,INC CHOLE,DISS/REM SURR TISSU WO TRISEG/LOBE SPLT;  Surgeon: Florene Glen, MD;  Location: MAIN OR [facility];  Service: Transplant    Family History   Problem Relation Age of Onset   ??? Asthma Mother    ??? COPD Father ??? Cancer Father    ??? Autoimmune disease Sister         AIH   ??? Heart disease Sister    ??? Colon cancer Maternal Grandfather         Allergies: Watermelon flavor                Objective Findings  Precautions / Restrictions  Precautions: Non-applicable  Weight Bearing Status: Non-applicable  Required Braces or Orthoses: Non-applicable    Communication Preference: Verbal   Pain Comments: No pain  Medical Tests / Procedures: EMR reviewed  Equipment / Environment: Vascular access (PIV, TLC, Port-a-cath, PICC), Patient not wearing mask for full session    At Rest: VSS  With Activity: VSS  Orthostatics: Asymptomatic       Living Situation  Living Environment: House  Lives With: Other(Ex-wife - available 24/7)  Home Living: Two level home, Full bath main level, Able to Live on main level with bedroom/bathroom, Stairs to enter without rails, Raised toilet seat without rails, Tub/shower unit, Walk-in shower  Number of Stairs: 2     Cognition: A&Ox4  Visual / Perception Status: Denies visual changes       UE ROM: WFL  UE Strength: 5/5 global  LE ROM: WFL  LE Strength: 5/5 global                       Sensation: Screen normal  Balance: Normal balance throughout         Bed Mobility: Indep with supine<>sit  Transfers: Indep with sit<>stand   Gait  Gait: Ambulated 300 ft indep  Stairs: Navigated 2 stairs indep      Endurance: Good. Slight deficits, unable to ambulate longer distances (>500 ft) without becoming SOB and fatigued    Physical Therapy Session Duration  PT Individual - Duration: 15    Medical Staff Made Aware: RN aware    I attest that I have reviewed the above information.  Signed: Vicenta Dunning, PT  Filed 12/28/2018

## 2018-12-28 NOTE — Unmapped (Addendum)
Anthony Alvarado is a 58 y.o. male with history of cryptogenic cirrhosis and hepatocellular carcinoma s/p OLT 09/16/2018, aortic valve stenosis, type 2 diabetes, autoimmune cholangitis with recent drainage of intra-abdominal fluid collection by VIR  that was admitted to the hospital on 12/26/2018 for similar SOB. CT with ascites, pleural and pericardial effusions. He underwent thoracentesis with interventional Pulmonology on 12/27/2018 with 1L of fluid being drained and sent for cultures, cytology and fluid studies.  He tolerated the procedure well, and had improved respiratory status following. His SOB resolved11/4/20 and CXR revealed minimal effusion.    He did well post procedure. His diet was advanced to regular, and at the time of discharge he was tolerating a regular diet. The patient was able to void spontaneously, have his pain controlled with P.O. pain medication, and ambulate with minimal assistance. He was breathing easily on room air.    He is being discharged on 12/28/18 to home in stable condition with planned outpatient follow-up.    Consults: Interventional Pulmonology

## 2018-12-28 NOTE — Unmapped (Signed)
Indications: Right Pleural Effusion    Monitoring:  The patient was monitored continuously by heart rate, blood pressure, pulse oximetry, and EKG tracing.     Procedure Details:      After the risks benefits and alternatives of the procedure were thoroughly explained, informed consent was obtained including the risks of chest pain, cough, bleeding, infection, injury to the lung or orther organ and a pneumothorax requiring chest tube placement. Immediately prior to the procedure, the time out was executed including correct patient identification and agreement on the procedure to be performed.     Using ultrasound guidance a moderate,hypoechoic, simple pleural effusion was noted.  Images are saved on the Interventional Pulmonary ultrasound machine for future review as needed. The area was prepped with chlorhexadine and draped in sterile fashion.  Following this 10 mL of 1% lidocaine as injected subcutaneously to provide topical anesthesia then deep to the pleura.  A small incision was then made parallel and superior to the rib, the thoracentesis needle with catheter was inserted into the chest wall and advanced under constant aspiration.  Upon aspiration of pleural fluid, the catheter was advanced into the pleural space and the needle was removed.  The catheter was then connected to a drainange bag and the fluid was removed under manual drainage.  The procedure was completed with symptoms of chest pain/pressure The catheter was then removed during slow forced exhalation and bandaged.     Findings:  950 mL of serosanguinous pleural fluid was removed. The procedure was terminated due to symptoms of chest pain/pressure. The fluid was sent for cytology, glucose, protein, LDH, Cell count and differential, gram stain and culture, albumin or cholesterol.     Estimate Blood Loss:  None    Complications: None Plan: A follow up chest x-ray revealed right greater than left small bilateral pleural effusions and bibasilar opacities, likely atelectasis      Cleora Fleet, MD  Interventional Pulmonology

## 2018-12-28 NOTE — Unmapped (Signed)
Received notice pt still need to be seen by pulm prior to discharge. Notified patient. Patient reports feeling improved with his SOB. Reviewed with patient to resume his lab schedule 2x/week and to call for any s/s infection/rejection, SOB, ect. Pt verbalizes understanding.  Caryl Ada Inpatient Transplant Nurse Coordinator 12/28/2018 2:57 PM

## 2018-12-28 NOTE — Unmapped (Signed)
INTERVENTIONAL PULMONOLOGY INITIAL CONSULT NOTE    Assessment:     [name] is a 58 year old Caucasian male with a past medical history of cryptogenic cirrhosis and hepatocellular carcinoma s/p orthotopic liver transplantation in July 2020 is admitted with increasing dyspnea secondary to a persistent right sided pleural effusion which was initially noted on a 2 view radiograph obtained on 8/17. Numerous abdominal collections were seen on imaging from 10/2, necessitating VIR drainage. We are consulted in the setting of a persistent right sided pleural effusion.     Plan:     1. S/p right sided thoracentesis with 950 ml of serosanguineous fluid drained. Please see procedure note for further details     2. Fluid studies are consistent with an exudative effusion. Differential remains pending. Etiologies can include infectious versus movement of liquid from abdomen to the pleural space, given recent drainage of abdominal fluid collection       3. No airspace consolidation identified on imaging therefore it is unlikely that this is a parapneumonic process     Please call the Interventional Pulmonary pager at [phone] with any questions.    History:     Requesting Physician and Service:  SRF    Reason for Consult: Persistent right pleural effusion     HPI: [name] is a 58 year old Caucasian male with a past medical history of cryptogenic cirrhosis and hepatocellular carcinoma s/p orthotopic liver transplantation in July 2020, moderate aortic valve stenosis, type 2 diabetes, and autoimmune cholangitis presents with ongoing conversational dyspnea, that is also present on exertion. He reports ongoing abdominal distension with left shoulder pain.  He denies pleuritic chest pain, and having a cough with/without sputum production. He denies recent exposure to sick contacts and has been compliant with his immunosuppressive regimen. Similar appearing perihepatic fluid collections were noted on imaging on 10/6, with a right posterior intermediate density collection, with hyperdense wall measuring 2.7 by 4 cm with a small adjacent 1.2 cm collection. Additionally, a fluid collection along the falciform ligament extending inferiorly to a contiguous anterior abdominal collection measuring approximately 10.4 by 5.8 cm was seen, necessitating drainage by VIR. Repeat imaging from 11/2 revealed a loculated subhepatic fluid collection along the right hepatic lobe which is albeit smaller but still present.      Past Medical History:   Diagnosis Date   ??? Aortic valve stenosis    ??? Autoimmune cholangitis    ??? 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 Cholestatic cirrhosis (CMS-HCC)    ??? Hepatic encephalopathy (CMS-HCC)    ??? Hypertension    ??? Sleep apnea    ??? Type 2 diabetes mellitus (CMS-HCC)        Past Surgical History:   Procedure Laterality Date   ??? APPENDECTOMY     ??? CHG US GUIDE, TISSUE ABLATION N/A 05/03/2018    Procedure: ULTRASOUND GUIDANCE FOR, AND MONITORING OF, PARENCHYMAL TISSUE ABLATION;  Surgeon: Particia Nearing, MD;  Location: MAIN OR [facility];  Service: Transplant   ??? PR CATH PLACE/CORON ANGIO, IMG SUPER/INTERP,R&L HRT CATH, L HRT VENTRIC N/A 03/21/2018 Procedure: Left/Right Heart Catheterization;  Surgeon: Neal Dy, MD;  Location: [facility] CATH;  Service: Cardiology   ??? PR LAP,ABLAT 1+ LIVER TUMOR(S),RADIOFREQ N/A 05/03/2018    Procedure: LAPAROSCOPY, SURGICAL, ABLATION OF 1 OR MORE LIVER TUMOR(S); RADIOFREQUENCY;  Surgeon: Particia Nearing, MD;  Location: MAIN OR [facility];  Service: Transplant   ??? PR TRANSPLANT LIVER,ALLOTRANSPLANT Bilateral 09/16/2018  Procedure: LIVER ALLOTRANSPLANTATION; ORTHOTOPIC, PARTIAL OR WHOLE, FROM CADAVER OR LIVING DONOR, ANY AGE;  Surgeon: Florene Glen, MD;  Location: MAIN OR [facility]  Service: Transplant   ??? PR TRANSPLANT,PREP DONOR LIVER, WHOLE N/A 09/16/2018    Procedure: [facility] STD PREP CAD DONOR WHOLE LIVER GFT PRIOR TNSPLNT,INC CHOLE,DISS/REM SURR TISSU WO TRISEG/LOBE SPLT;  Surgeon: Florene Glen, MD;  Location: MAIN OR [place];  Service: Transplant       Current Facility-Administered Medications   Medication Dose Route Frequency Provider Last Rate Last Dose   ??? acetaminophen (TYLENOL) tablet 650 mg  650 mg Oral Q6H PRN Ardine Eng, MD       ??? amLODIPine (NORVASC) tablet 10 mg  10 mg Oral Daily Ardine Eng, MD   10 mg at 12/27/18 0844   ??? aspirin chewable tablet 81 mg  81 mg Oral Daily Ardine Eng, MD   81 mg at 12/27/18 5409   ??? calcium carbonate (TUMS) chewable tablet 200 mg elem calcium  200 mg elem calcium Oral Daily PRN Ardine Eng, MD       ??? dextrose 50 % in water (D50W) 50 % solution 12.5 g  12.5 g Intravenous Q10 Min PRN Ardine Eng, MD       ??? fluticasone propionate (FLONASE) 50 mcg/actuation nasal spray 2 spray  2 spray Each Nare Daily Ardine Eng, MD       ??? gabapentin (NEURONTIN) capsule 300 mg  300 mg Oral BID Ardine Eng, MD   300 mg at 12/27/18 1950   ??? heparin (porcine) injection 5,000 Units  5,000 Units Subcutaneous [facility] Ardine Eng, MD   5,000 Units at 12/27/18 1406 ??? insulin regular (HumuLIN,NovoLIN) injection 0-12 Units  0-12 Units Subcutaneous ACHS Ardine Eng, MD       ??? levothyroxine (SYNTHROID) tablet 137 mcg  137 mcg Oral Daily Ardine Eng, MD   137 mcg at 12/27/18 0844   ??? mycophenolate (MYFORTIC) EC tablet 360 mg  360 mg Oral BID Ardine Eng, MD   360 mg at 12/27/18 2000   ??? ondansetron (ZOFRAN) injection 4 mg  4 mg Intravenous Q6H PRN Ardine Eng, MD   4 mg at 12/27/18 1413   ??? oxyCODONE (ROXICODONE) immediate release tablet 5 mg  5 mg Oral Q4H PRN Ardine Eng, MD   5 mg at 12/27/18 8119   ??? sertraline (ZOLOFT) tablet 25 mg  25 mg Oral Daily Ardine Eng, MD   25 mg at 12/27/18 0848   ??? sodium chloride (NS) 0.9 % infusion  50 mL/hr Intravenous Continuous Carolynn Sayers, MD 50 mL/hr at 12/27/18 1225 50 mL/hr at 12/27/18 1225   ??? sulfamethoxazole-trimethoprim (BACTRIM) 400-80 mg tablet 80 mg of trimethoprim  1 tablet Oral 3x weekly Ardine Eng, MD       ??? tacrolimus (PROGRAF) capsule 2 mg  2 mg Oral BID Ardine Eng, MD   2 mg at 12/27/18 1752   ??? tiZANidine (ZANAFLEX) tablet 2 mg  2 mg Oral TID Ardine Eng, MD   2 mg at 12/27/18 2000     Facility-Administered Medications Ordered in Other Encounters   Medication Dose Route Frequency Provider Last Rate Last Dose   ??? diazePAM (VALIUM) 5 mg/mL injection                Allergies as of 12/26/2018 - Reviewed 12/26/2018   Allergen Reaction Noted   ??? Watermelon  flavor  04/29/2016       Family History   Problem Relation Age of Onset   ??? Asthma Mother    ??? COPD Father    ??? Cancer Father    ??? Autoimmune disease Sister         AIH   ??? Heart disease Sister    ??? Colon cancer Maternal Grandfather        Social History     Socioeconomic History   ??? Marital status: Legally Separated     Spouse name: Not on file   ??? Number of children: Not on file   ??? Years of education: Not on file   ??? Highest education level: Not on file   Occupational History   ??? Not on file   Social Needs ??? Financial resource strain: Not on file   ??? Food insecurity     Worry: Not on file     Inability: Not on file   ??? Transportation needs     Medical: Not on file     Non-medical: Not on file   Tobacco Use   ??? Smoking status: Never Smoker   ??? Smokeless tobacco: Never Used   Substance and Sexual Activity   ??? Alcohol use: Not Currently   ??? Drug use: Never   ??? Sexual activity: Not on file   Lifestyle   ??? Physical activity     Days per week: Not on file     Minutes per session: Not on file   ??? Stress: Not on file   Relationships   ??? Social Wellsite geologist on phone: Not on file     Gets together: Not on file     Attends religious service: Not on file     Active member of club or organization: Not on file     Attends meetings of clubs or organizations: Not on file     Relationship status: Not on file   Other Topics Concern   ??? Not on file   Social History Narrative   ??? Not on file       Review of Systems  A 12 point review of systems was negative except for pertinent items noted in the HPI.    Objective:     Physical Examination:   BP 103/77  - Pulse 76  - Temp 36.7 ??C (98.1 ??F) (Oral)  - Resp 18  - Ht 172.7 cm (5' 8)  - Wt 78.9 kg (174 lb)  - SpO2 94%  - BMI 26.46 kg/m??     General appearance - alert, well appearing, and in no distress    Eyes - Sclera anicteric, conjunctiva pink  Mouth - mucous membranes moist, pharynx normal without lesions  Neck - Trachea supple and midline, (-) JVD   Lymphatics - no palpable lymphadenopathy  Heart - normal rate, regular rhythm, normal S1, S2, no murmurs, rubs, clicks or gallops  Chest - Diminished breath sounds on the right. No wheezes/rales/rhonchi   Abdomen - soft, nontender, nondistended, no masses or organomegaly  Extremities - No pedal edema, no clubbing or cyanosis  Skin - normal coloration and turgor, no rashes, no suspicious skin lesions noted  Neurological - alert, oriented, normal speech, no focal findings or movement disorder noted    Labs and Imaging: LDH, Fluid (12/27/18): 775  Protein, Fluid (12/27/18): 4.2   Total Protein (12/27/18): 6.1   CBC (12/27/18): 11>11.4/36.1<292  BUN/sCr (12/27/18): 20/0.89    Cleora Fleet, MD  Interventional Pulmonology

## 2018-12-29 DIAGNOSIS — Z944 Liver transplant status: Principal | ICD-10-CM

## 2018-12-29 DIAGNOSIS — M810 Age-related osteoporosis without current pathological fracture: Principal | ICD-10-CM

## 2018-12-29 DIAGNOSIS — Z79899 Other long term (current) drug therapy: Principal | ICD-10-CM

## 2018-12-29 DIAGNOSIS — E559 Vitamin D deficiency, unspecified: Principal | ICD-10-CM

## 2018-12-29 DIAGNOSIS — E612 Magnesium deficiency: Principal | ICD-10-CM

## 2018-12-29 MED ORDER — CHOLECALCIFEROL (VITAMIN D3) 25 MCG (1,000 UNIT) TABLET
ORAL_TABLET | Freq: Every day | ORAL | 11 refills | 30 days | Status: CP
Start: 2018-12-29 — End: 2019-12-29

## 2018-12-29 NOTE — Unmapped (Signed)
Patient discharged from the hospital yesterday. Reached out to him to discuss his vitamin D supplementation. He said he is feeling much better, but continues to have some chest pain tha radiates to his shoulder blade. He is using his IS to increase his lung capacity. Discussed the new dose of vit d and encouraged him to take two-1000unit caps daily. We also reviewed his tac dose, which was changed on 1-/27 to 2mg  bid. He verbalized understanding of all discussed.

## 2018-12-31 MED ORDER — FUROSEMIDE 20 MG TABLET
ORAL_TABLET | Freq: Two times a day (BID) | ORAL | 11 refills | 30 days | Status: CP
Start: 2018-12-31 — End: 2019-12-31
  Filled 2019-01-02: qty 60, 30d supply, fill #0

## 2018-12-31 NOTE — Unmapped (Addendum)
Patient paged on call coordinator to relay recent progression of shortness of breath since hospital discharge this week in which he had thoracentesis - he also endorses 6-8lb weight gain over last 5-6 days.  He noted team mentioned taking lasix and paged to inquire if this would be helpful.    Patient reports shortness of breath mostly on activity - but also minimally noted at rest, denies any acute distress or lower extremity edema but is paging due to increasing shortness of breath and weight gain.  Reviewed importance of low sodium diet to aid in fluid volume status.      Reviewed patient symptoms with Dr. Rush Barer - given stable creatinine and signs of fluid retention Dr. Rush Barer orders patient to begin lasix 20.mg BID.  Returned call to patient to provide directions to begin lasix 20.mg BID - discussed would be beneficial to take second dosing in the afternoon versus bedtime given increased UOP.  Scripted lasix to [facility] for future refills as patient confirms he has supply of about a dozen of these pills at home he can start taking.      Encouraged patient to f/u with primary coordinator next week if symptoms do not improve with lasix course.

## 2019-01-02 DIAGNOSIS — Z79899 Other long term (current) drug therapy: Principal | ICD-10-CM

## 2019-01-02 DIAGNOSIS — Z944 Liver transplant status: Principal | ICD-10-CM

## 2019-01-02 DIAGNOSIS — E612 Magnesium deficiency: Principal | ICD-10-CM

## 2019-01-02 MED FILL — FUROSEMIDE 20 MG TABLET: 30 days supply | Qty: 60 | Fill #0 | Status: AC

## 2019-01-02 NOTE — Unmapped (Signed)
Change in Tacrolimus dosage decrease. Co-pay $0.

## 2019-01-03 DIAGNOSIS — Z944 Liver transplant status: Principal | ICD-10-CM

## 2019-01-03 DIAGNOSIS — Z79899 Other long term (current) drug therapy: Principal | ICD-10-CM

## 2019-01-03 DIAGNOSIS — E612 Magnesium deficiency: Principal | ICD-10-CM

## 2019-01-03 LAB — CBC W/ DIFFERENTIAL
BANDED NEUTROPHILS ABSOLUTE COUNT: 0.1 10*3/uL (ref 0.0–0.1)
BASOPHILS ABSOLUTE COUNT: 0.1 10*3/uL (ref 0.0–0.2)
BASOPHILS RELATIVE PERCENT: 1 %
EOSINOPHILS ABSOLUTE COUNT: 0.5 10*3/uL — ABNORMAL HIGH (ref 0.0–0.4)
EOSINOPHILS RELATIVE PERCENT: 4 %
HEMATOCRIT: 35.5 % — ABNORMAL LOW (ref 37.5–51.0)
HEMOGLOBIN: 11.8 g/dL — ABNORMAL LOW (ref 13.0–17.7)
IMMATURE GRANULOCYTES: 1 %
LYMPHOCYTES ABSOLUTE COUNT: 1.6 10*3/uL (ref 0.7–3.1)
LYMPHOCYTES RELATIVE PERCENT: 13 %
MEAN CORPUSCULAR HEMOGLOBIN CONC: 33.2 g/dL (ref 31.5–35.7)
MEAN CORPUSCULAR HEMOGLOBIN: 26.5 pg — ABNORMAL LOW (ref 26.6–33.0)
MEAN CORPUSCULAR VOLUME: 80 fL (ref 79–97)
MONOCYTES RELATIVE PERCENT: 15 %
NEUTROPHILS ABSOLUTE COUNT: 8.2 10*3/uL — ABNORMAL HIGH (ref 1.4–7.0)
NEUTROPHILS RELATIVE PERCENT: 66 %
PLATELET COUNT: 381 10*3/uL (ref 150–450)
RED BLOOD CELL COUNT: 4.46 x10E6/uL (ref 4.14–5.80)
WHITE BLOOD CELL COUNT: 12.2 10*3/uL — ABNORMAL HIGH (ref 3.4–10.8)

## 2019-01-03 LAB — EOSINOPHILS ABSOLUTE COUNT: Eosinophils:NCnc:Pt:Bld:Qn:Automated count: 0.5 — ABNORMAL HIGH

## 2019-01-04 LAB — COMPREHENSIVE METABOLIC PANEL
A/G RATIO: 1.8 (ref 1.2–2.2)
ALBUMIN: 3.9 g/dL (ref 3.8–4.9)
ALKALINE PHOSPHATASE: 114 IU/L (ref 39–117)
ALT (SGPT): 14 IU/L (ref 0–44)
AST (SGOT): 10 IU/L (ref 0–40)
BILIRUBIN TOTAL: 0.3 mg/dL (ref 0.0–1.2)
BUN / CREAT RATIO: 17 (ref 9–20)
CHLORIDE: 102 mmol/L (ref 96–106)
CO2: 20 mmol/L (ref 20–29)
CREATININE: 1.33 mg/dL — ABNORMAL HIGH (ref 0.76–1.27)
GLOBULIN, TOTAL: 2.2 g/dL (ref 1.5–4.5)
GLUCOSE: 114 mg/dL — ABNORMAL HIGH (ref 65–99)
POTASSIUM: 4.6 mmol/L (ref 3.5–5.2)
SODIUM: 139 mmol/L (ref 134–144)
TOTAL PROTEIN: 6.1 g/dL (ref 6.0–8.5)

## 2019-01-04 LAB — MAGNESIUM: Magnesium:MCnc:Pt:Ser/Plas:Qn:: 1.8

## 2019-01-04 LAB — BILIRUBIN DIRECT: Bilirubin.glucuronidated+Bilirubin.albumin bound:MCnc:Pt:Ser/Plas:Qn:: 0.14

## 2019-01-04 LAB — BUN / CREAT RATIO: Urea nitrogen/Creatinine:MRto:Pt:Ser/Plas:Qn:: 17

## 2019-01-04 LAB — PHOSPHORUS, SERUM: Phosphate:MCnc:Pt:Ser/Plas:Qn:: 4

## 2019-01-04 LAB — GAMMA GT: GAMMA GLUTAMYL TRANSFERASE: 51 IU/L (ref 0–65)

## 2019-01-04 LAB — GAMMA GLUTAMYL TRANSFERASE: Gamma glutamyl transferase:CCnc:Pt:Ser/Plas:Qn:: 51

## 2019-01-05 ENCOUNTER — Ambulatory Visit
Admit: 2019-01-05 | Discharge: 2019-01-17 | Disposition: A | Discharge status: Home / Self Care | Payer: MEDICARE | Source: Ambulatory Visit | Admitting: Surgery

## 2019-01-05 ENCOUNTER — Ambulatory Visit
Admit: 2019-01-05 | Discharge: 2019-01-17 | Disposition: A | Discharge status: Home / Self Care | Payer: MEDICARE | Source: Ambulatory Visit | Attending: Student in an Organized Health Care Education/Training Program | Admitting: Surgery

## 2019-01-05 ENCOUNTER — Encounter
Admit: 2019-01-05 | Discharge: 2019-01-17 | Disposition: A | Discharge status: Home / Self Care | Payer: MEDICARE | Source: Ambulatory Visit | Attending: Student in an Organized Health Care Education/Training Program | Admitting: Surgery

## 2019-01-05 DIAGNOSIS — Z944 Liver transplant status: Principal | ICD-10-CM

## 2019-01-05 DIAGNOSIS — R0602 Shortness of breath: Principal | ICD-10-CM

## 2019-01-05 DIAGNOSIS — Z79899 Other long term (current) drug therapy: Principal | ICD-10-CM

## 2019-01-05 DIAGNOSIS — E612 Magnesium deficiency: Principal | ICD-10-CM

## 2019-01-05 DIAGNOSIS — I517 Cardiomegaly: Secondary | ICD-10-CM | POA: Diagnosis not present

## 2019-01-05 DIAGNOSIS — I10 Essential (primary) hypertension: Secondary | ICD-10-CM | POA: Diagnosis not present

## 2019-01-05 DIAGNOSIS — H538 Other visual disturbances: Secondary | ICD-10-CM | POA: Diagnosis not present

## 2019-01-05 DIAGNOSIS — R9431 Abnormal electrocardiogram [ECG] [EKG]: Secondary | ICD-10-CM | POA: Diagnosis not present

## 2019-01-05 DIAGNOSIS — Q231 Congenital insufficiency of aortic valve: Secondary | ICD-10-CM | POA: Diagnosis not present

## 2019-01-05 DIAGNOSIS — E875 Hyperkalemia: Secondary | ICD-10-CM | POA: Diagnosis not present

## 2019-01-05 DIAGNOSIS — D721 Eosinophilia, unspecified: Secondary | ICD-10-CM | POA: Diagnosis not present

## 2019-01-05 DIAGNOSIS — Z20828 Contact with and (suspected) exposure to other viral communicable diseases: Secondary | ICD-10-CM | POA: Diagnosis not present

## 2019-01-05 DIAGNOSIS — R200 Anesthesia of skin: Secondary | ICD-10-CM | POA: Diagnosis not present

## 2019-01-05 DIAGNOSIS — J948 Other specified pleural conditions: Secondary | ICD-10-CM | POA: Diagnosis not present

## 2019-01-05 DIAGNOSIS — J45909 Unspecified asthma, uncomplicated: Secondary | ICD-10-CM | POA: Diagnosis not present

## 2019-01-05 DIAGNOSIS — J929 Pleural plaque without asbestos: Secondary | ICD-10-CM | POA: Diagnosis not present

## 2019-01-05 DIAGNOSIS — J9811 Atelectasis: Secondary | ICD-10-CM | POA: Diagnosis not present

## 2019-01-05 DIAGNOSIS — E8770 Fluid overload, unspecified: Secondary | ICD-10-CM | POA: Diagnosis not present

## 2019-01-05 DIAGNOSIS — M4802 Spinal stenosis, cervical region: Secondary | ICD-10-CM | POA: Diagnosis not present

## 2019-01-05 DIAGNOSIS — M47812 Spondylosis without myelopathy or radiculopathy, cervical region: Secondary | ICD-10-CM | POA: Diagnosis not present

## 2019-01-05 DIAGNOSIS — E119 Type 2 diabetes mellitus without complications: Secondary | ICD-10-CM | POA: Diagnosis not present

## 2019-01-05 DIAGNOSIS — J939 Pneumothorax, unspecified: Secondary | ICD-10-CM | POA: Diagnosis not present

## 2019-01-05 DIAGNOSIS — I313 Pericardial effusion (noninflammatory): Secondary | ICD-10-CM | POA: Diagnosis not present

## 2019-01-05 DIAGNOSIS — R930 Abnormal findings on diagnostic imaging of skull and head, not elsewhere classified: Secondary | ICD-10-CM | POA: Diagnosis not present

## 2019-01-05 DIAGNOSIS — J90 Pleural effusion, not elsewhere classified: Secondary | ICD-10-CM | POA: Diagnosis not present

## 2019-01-05 DIAGNOSIS — R918 Other nonspecific abnormal finding of lung field: Secondary | ICD-10-CM | POA: Diagnosis not present

## 2019-01-05 DIAGNOSIS — Z5181 Encounter for therapeutic drug level monitoring: Secondary | ICD-10-CM | POA: Diagnosis not present

## 2019-01-05 DIAGNOSIS — I349 Nonrheumatic mitral valve disorder, unspecified: Secondary | ICD-10-CM | POA: Diagnosis not present

## 2019-01-05 DIAGNOSIS — Z801 Family history of malignant neoplasm of trachea, bronchus and lung: Secondary | ICD-10-CM | POA: Diagnosis not present

## 2019-01-05 DIAGNOSIS — G4733 Obstructive sleep apnea (adult) (pediatric): Secondary | ICD-10-CM | POA: Diagnosis not present

## 2019-01-05 DIAGNOSIS — R140 Abdominal distension (gaseous): Secondary | ICD-10-CM | POA: Diagnosis not present

## 2019-01-05 DIAGNOSIS — Z4682 Encounter for fitting and adjustment of non-vascular catheter: Secondary | ICD-10-CM | POA: Diagnosis not present

## 2019-01-05 LAB — COMPREHENSIVE METABOLIC PANEL
ALBUMIN: 3.8 g/dL (ref 3.5–5.0)
ALKALINE PHOSPHATASE: 103 U/L (ref 38–126)
ALT (SGPT): 14 U/L (ref <50)
ANION GAP: 12 mmol/L (ref 7–15)
AST (SGOT): 20 U/L (ref 19–55)
BILIRUBIN TOTAL: 0.4 mg/dL (ref 0.0–1.2)
BLOOD UREA NITROGEN: 31 mg/dL — ABNORMAL HIGH (ref 7–21)
BUN / CREAT RATIO: 25
CALCIUM: 9 mg/dL (ref 8.5–10.2)
CHLORIDE: 100 mmol/L (ref 98–107)
CREATININE: 1.26 mg/dL (ref 0.70–1.30)
EGFR CKD-EPI AA MALE: 73 mL/min/{1.73_m2} (ref >=60)
EGFR CKD-EPI NON-AA MALE: 63 mL/min/{1.73_m2} (ref >=60)
GLUCOSE RANDOM: 147 mg/dL (ref 70–179)
POTASSIUM: 4.8 mmol/L (ref 3.5–5.0)
PROTEIN TOTAL: 6.5 g/dL (ref 6.5–8.3)
SODIUM: 137 mmol/L (ref 135–145)

## 2019-01-05 LAB — CBC W/ AUTO DIFF
BASOPHILS RELATIVE PERCENT: 0.8 %
EOSINOPHILS ABSOLUTE COUNT: 0.6 10*9/L — ABNORMAL HIGH (ref 0.0–0.4)
EOSINOPHILS RELATIVE PERCENT: 3.6 %
HEMATOCRIT: 39.1 % — ABNORMAL LOW (ref 41.0–53.0)
HEMOGLOBIN: 12.2 g/dL — ABNORMAL LOW (ref 13.5–17.5)
LARGE UNSTAINED CELLS: 1 % (ref 0–4)
LYMPHOCYTES ABSOLUTE COUNT: 1.7 10*9/L (ref 1.5–5.0)
MEAN CORPUSCULAR HEMOGLOBIN CONC: 31.1 g/dL (ref 31.0–37.0)
MEAN CORPUSCULAR HEMOGLOBIN: 25.7 pg — ABNORMAL LOW (ref 26.0–34.0)
MEAN CORPUSCULAR VOLUME: 82.6 fL (ref 80.0–100.0)
MEAN PLATELET VOLUME: 8.3 fL (ref 7.0–10.0)
MONOCYTES ABSOLUTE COUNT: 1.5 10*9/L — ABNORMAL HIGH (ref 0.2–0.8)
NEUTROPHILS ABSOLUTE COUNT: 11.5 10*9/L — ABNORMAL HIGH (ref 2.0–7.5)
NEUTROPHILS RELATIVE PERCENT: 74.1 %
PLATELET COUNT: 456 10*9/L — ABNORMAL HIGH (ref 150–440)
RED BLOOD CELL COUNT: 4.73 10*12/L (ref 4.50–5.90)
RED CELL DISTRIBUTION WIDTH: 14.2 % (ref 12.0–15.0)
WBC ADJUSTED: 15.5 10*9/L — ABNORMAL HIGH (ref 4.5–11.0)

## 2019-01-05 LAB — NEUTROPHILS ABSOLUTE COUNT: Neutrophils:NCnc:Pt:Bld:Qn:Automated count: 11.5 — ABNORMAL HIGH

## 2019-01-05 LAB — AST (SGOT): Aspartate aminotransferase:CCnc:Pt:Ser/Plas:Qn:: 20

## 2019-01-05 LAB — TACROLIMUS BLOOD: Tacrolimus:MCnc:Pt:Bld:Qn:LC/MS/MS: 12.4

## 2019-01-05 MED ORDER — TACROLIMUS 0.5 MG CAPSULE
ORAL_CAPSULE | Freq: Two times a day (BID) | ORAL | 11 refills | 30.00000 days | Status: SS
Start: 2019-01-05 — End: ?

## 2019-01-05 NOTE — Unmapped (Signed)
Admission History and Physical    Assessment/Plan:   Patient is a 58 y.o. male with cryptogenic cirrhosis and hepatocellular carcinoma s/p OLT 09/16/2018, aortic valve stenosis, type 2 diabetes, autoimmune cholangitis with recent drainage of intra-abdominal fluid collection by VIR, admitted recently 11/2-11/4 for thoracentesis with Interventional Pulmonology on 11/3. CXR from this morning shows recurrance of right sided pleural effusion.   ??  - Admit to SRF   - Regular diet, ML  - Consult placed to Interventional Pulmonolgy for drainage of pleural effusion   - Plan for thoracentesis in am  - ECHO to monitor moderate pericardial effusion  - EKG  - Resume home medication  - SQH  ??   If you have any questions, concerns or changes in the patient's clinical status, please page the [facility] floor pager. [phone]        History of Present Illness: :    Anthony Alvarado is 58 year old male with cryptogenic cirrhosis and hepatocellular carcinoma now status post OLT on 09/16/2018, aortic valve stenosis, type 2 diabetes, autoimmune cholangitis who presents as a direct admission for progressively worsening SOB and worsening pleural effusion.    He had a recent drainage of intra-abdominal fluid collection by VIR  and was admitted to the hospital on 12/26/2018 for similar SOB. CT??with ascites, pleural and moderate pericardial effusion. He underwent thoracentesis with interventional Pulmonology on 12/27/2018 with 1L of fluid being drained and sent for cultures, cytology and fluid studies.  He tolerated the procedure well, and had improved respiratory status following. His SOB resolved11/4/20 and CXR revealed minimal effusion. He did well post procedure, had improved respiratory function and was discharged on 11/4. He now presents as a direct admission on 01/05/2019 to the transplant surgery service. CXR this morning showed recurrence and worsening of right sided pleural effusion with bottom 2/3 of lung white out. He reports ever since leaving the hospital he has had progressive shortness of breath, worsening each day and now cannot walk to the bathroom without being winded. He endorses pain in right back/chest that is constant, no change with inspiration, no radiation. Has had an intermittent dry cough for the last few days. Denies fevers/chills, chest pain, N/V, constipation/diarrhea, dysuria.       Allergies:  Watermelon flavor     Medications:   Current Facility-Administered Medications   Medication Dose Route Frequency Provider Last Rate Last Dose   ??? acetaminophen (TYLENOL) tablet 650 mg  650 mg Oral Q4H PRN Dia Crawford, MD       ??? amLODIPine (NORVASC) tablet 10 mg  10 mg Oral Daily Dia Crawford, MD       ??? aspirin chewable tablet 81 mg  81 mg Oral Daily Dia Crawford, MD       ??? calcium carbonate (TUMS) chewable tablet 200 mg elem calcium  200 mg elem calcium Oral TID PRN Dia Crawford, MD       ??? fluticasone propionate (FLONASE) 50 mcg/actuation nasal spray 1 spray  1 spray Each Nare Daily Dia Crawford, MD       ??? gabapentin (NEURONTIN) capsule 300 mg  300 mg Oral BID Dia Crawford, MD       ??? heparin (porcine) injection 5,000 Units  5,000 Units Subcutaneous [facility] Dia Crawford, MD       ??? [START ON 01/06/2019] levothyroxine (SYNTHROID) tablet 137 mcg  137 mcg Oral daily Dia Crawford, MD       ??? mycophenolate (MYFORTIC) EC tablet 360 mg  360 mg Oral BID Dia Crawford,  MD       ??? ondansetron (ZOFRAN-ODT) disintegrating tablet 4 mg  4 mg Oral Q6H PRN Dia Crawford, MD       ??? oxyCODONE (ROXICODONE) immediate release tablet 5 mg  5 mg Oral Q6H PRN Dia Crawford, MD       ??? sertraline (ZOLOFT) tablet 25 mg  25 mg Oral Daily Dia Crawford, MD ??? [START ON 01/06/2019] sulfamethoxazole-trimethoprim (BACTRIM) 400-80 mg tablet 80 mg of trimethoprim  1 tablet Oral 3x weekly Dia Crawford, MD       ??? tacrolimus (PROGRAF) capsule 1.5 mg  1.5 mg Oral BID Dia Crawford, MD         Facility-Administered Medications Ordered in Other Encounters   Medication Dose Route Frequency Provider Last Rate Last Dose   ??? diazePAM (VALIUM) 5 mg/mL injection                Medical History:  Past Medical History:   Diagnosis Date   ??? Aortic valve stenosis    ??? Autoimmune cholangitis    ??? 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 Cholestatic cirrhosis (CMS-HCC)    ??? Hepatic encephalopathy (CMS-HCC)    ??? Hypertension    ??? Sleep apnea    ??? Type 2 diabetes mellitus (CMS-HCC)        Surgical History:  Past Surgical History:   Procedure Laterality Date   ??? APPENDECTOMY     ??? CHG US GUIDE, TISSUE ABLATION N/A 05/03/2018    Procedure: ULTRASOUND GUIDANCE FOR, AND MONITORING OF, PARENCHYMAL TISSUE ABLATION;  Surgeon: Particia Nearing, MD;  Location: MAIN OR [facility];  Service: Transplant   ??? PR CATH PLACE/CORON ANGIO, IMG SUPER/INTERP,R&L HRT CATH, L HRT VENTRIC N/A 03/21/2018    Procedure: Left/Right Heart Catheterization;  Surgeon: Neal Dy, MD;  Location: [facility] CATH;  Service: Cardiology   ??? PR EGD FLEXIBLE FOREIGN BODY REMOVAL N/A 12/26/2018    Procedure: UGI ENDOSCOPY; W/REMOVAL FOREIGN BODY;  Surgeon: Vonda Antigua, MD;  Location: [facility];  Service: Gastroenterology   ??? PR LAP,ABLAT 1+ LIVER TUMOR(S),RADIOFREQ N/A 05/03/2018    Procedure: LAPAROSCOPY, SURGICAL, ABLATION OF 1 OR MORE LIVER TUMOR(S); RADIOFREQUENCY;  Surgeon: Particia Nearing, MD;  Location: MAIN OR [facility];  Service: Transplant   ??? PR TRANSPLANT LIVER,ALLOTRANSPLANT Bilateral 09/16/2018 Procedure: LIVER ALLOTRANSPLANTATION; ORTHOTOPIC, PARTIAL OR WHOLE, FROM CADAVER OR LIVING DONOR, ANY AGE;  Surgeon: Florene Glen, MD;  Location: MAIN OR [facility];  Service: Transplant   ??? PR TRANSPLANT,PREP DONOR LIVER, WHOLE N/A 09/16/2018    Procedure: [facility] STD PREP CAD DONOR WHOLE LIVER GFT PRIOR TNSPLNT,INC CHOLE,DISS/REM SURR TISSU WO TRISEG/LOBE SPLT;  Surgeon: Florene Glen, MD;  Location: MAIN OR [place];  Service: Transplant       Social History:  Tobacco use: denies, life time non-smoker  Alcohol use: denies  Drug use: denies  Living situation: the patient lives alone, has a sister who lives near him    Family History:  The patient's family history includes Asthma in his mother; Autoimmune disease in his sister; COPD in his father; Cancer in his father; Colon cancer in his maternal grandfather; Heart disease in his sister..    Code Status:  Full Code    Review of Systems:  A 12 system review of systems was negative except as noted in HPI.    Objective: :    Patient Vitals for the past 8 hrs:   BP Temp Temp  src Pulse Resp SpO2   01/05/19 1723 116/84 37.2 ??C Oral 88 17 98 %     No intake/output data recorded.      Physical Exam  General: Cooperative, no distress, well appearing   Head/Neck: Normocephalic, atraumatic, no jugular venous distension  Pulmonary: Normal work of breathing, equal bilateral chest rise. Decrease respiratory sounds on right side, lower lobe with minimal breath sounds, decreased in mid lung field, dull to percussion. Left side wnl  Cardiac: RRR, harsh holosystolic murmur (bicuspid aortic valve/aortic stenosis), heart sounds not muffled  Abdomen: Soft, non-distended, non-tender. Well healed mercedes incision from prior liver transplant.   Skin: No jaundice, rashes, erythema, or lesions. Skin color and texture normal.  Neurologic: Alert and interactive, no motor abnormalities noted.     Test Results  Data Review:  All lab results last 24 hours: Recent Results (from the past 24 hour(s))   CBC w/ Differential    Collection Time: 01/05/19  5:48 PM   Result Value Ref Range    WBC 15.5 (H) 4.5 - 11.0 10*9/L    RBC 4.73 4.50 - 5.90 10*12/L    HGB 12.2 (L) 13.5 - 17.5 g/dL    HCT 16.1 (L) 09.6 - 53.0 %    MCV 82.6 80.0 - 100.0 fL    MCH 25.7 (L) 26.0 - 34.0 pg    MCHC 31.1 31.0 - 37.0 g/dL    RDW 04.5 40.9 - 81.1 %    MPV 8.3 7.0 - 10.0 fL    Platelet 456 (H) 150 - 440 10*9/L    Neutrophils % 74.1 %    Lymphocytes % 10.8 %    Monocytes % 9.7 %    Eosinophils % 3.6 %    Basophils % 0.8 %    Absolute Neutrophils 11.5 (H) 2.0 - 7.5 10*9/L    Absolute Lymphocytes 1.7 1.5 - 5.0 10*9/L    Absolute Monocytes 1.5 (H) 0.2 - 0.8 10*9/L    Absolute Eosinophils 0.6 (H) 0.0 - 0.4 10*9/L    Absolute Basophils 0.1 0.0 - 0.1 10*9/L    Large Unstained Cells 1 0 - 4 %    Microcytosis Slight (A) Not Present    Hypochromasia Moderate (A) Not Present     Imaging: Radiology studies were personally reviewed. Additional studies pending.     EXAM: XR CHEST 2 VIEWS  DATE: 01/05/2019 1:42 PM  ACCESSION: [id number]  DICTATED: 01/05/2019 2:05 PM  INTERPRETATION LOCATION: [facility]  ??  CLINICAL INDICATION: 59 years old Male with [place] ; SHORTNESS OF BREATH  - R06.02 - Shortness of breath    ??  COMPARISON: Chest radiograph 12/28/2018  ??  TECHNIQUE: PA and Lateral Chest Radiographs.  ??  FINDINGS:   ??  Increased moderate right pleural effusion. Similar small left pleural effusion. Similar bibasilar linear opacities, likely atelectasis.  ??  No pneumothorax.  ??  Partially obscured cardiomediastinal silhouette.   ??  IMPRESSION:  ??  Increased moderate right and similar small left pleural effusions with adjacent atelectasis versus airspace disease.

## 2019-01-05 NOTE — Unmapped (Signed)
Called Anthony Alvarado to discuss his shortness of breath which started the day after he got home from the hospital. It has not worsened since then. He can only walk from his living room to the bathroom and has orthopnea. Denies fevers, chills, night sweats. Denies chest pain or flank pain. Denies swelling in his ankles.     He has an appointment with liver transplant clinic at 2 pm. Recommend chest X-ray to evaluate for reaccumulation of pleural effusion and will determine a plan from there. He is agreeable to this plan.    Rhena Glace R. Maple Mirza, DO  Assistant Professor of Medicine  Interventional Pulmonology  Division of Pulmonary Diseases and Critical Care Medicine

## 2019-01-05 NOTE — Unmapped (Addendum)
Patient completed xray and seen in clinic by Dr.Serrano. Dr.contacted coordinator with plan to admit patient for thoracentesis and possible chest tube placement. Arranged same day admission to Obs floor, per Armando Reichert, who said they can manage this case. Updated Dr.Serrano and patient.     Reviewed supratherapeutic tac level from 11/10 with txp PharmD, Cleone Slim, who recommended reducing his dose to 2mg /1mg  daily. Changed order and updated patient.

## 2019-01-05 NOTE — Unmapped (Signed)
[facility] Refill Coordination Note    Specialty Medication(s) to be Shipped:   Transplant: mycophenolate mofetil 180mg  and tacrolimus 0.5mg     Other medication(s) to be shipped: alendronate 70mg , gabapentin 300mg , and bactrim     Anthony Alvarado, DOB: 04/18/1960  Phone: [phone] (home)       All above HIPAA information was verified with patient.     Completed refill call assessment today to schedule patient's medication shipment from the [facility] [phone]).       Specialty medication(s) and dose(s) confirmed: Regimen is correct and unchanged.   Changes to medications: Anthony Alvarado reports no changes at this time.  Changes to insurance: No  Questions for the pharmacist: No    Confirmed patient received Welcome Packet with first shipment. The patient will receive a drug information handout for each medication shipped and additional FDA Medication Guides as required.       DISEASE/MEDICATION-SPECIFIC INFORMATION        N/A    SPECIALTY MEDICATION ADHERENCE     Medication Adherence    Patient reported X missed doses in the last month: 0  Specialty Medication: Mycophenolate 180mg   Patient is on additional specialty medications: Yes  Additional Specialty Medications: Tacrolimus 0.5mg   Patient Reported Additional Medication X Missed Doses in the Last Month: 0  Patient is on more than two specialty medications: No          Mycophenolate 180 mg: 14 days of medicine on hand   Tacrolimus 0.5 mg: 14 days of medicine on hand     SHIPPING     Shipping address confirmed in Epic.     Delivery Scheduled: Yes, Expected medication delivery date: 01/16/2019.     Medication will be delivered via UPS to the prescription address in Epic WAM.    Lorelei Pont [facility] Specialty Technician

## 2019-01-05 NOTE — Unmapped (Addendum)
Reached out again today to Intv.Pulm.navigator, Tammy Allred,pa  who , as not able to reach oe of the thoracic surgeons to arrange urgent appt. Arranged txp clinic appt for evaluation by Dr.Serrano in the interim, and sent notification email. Spoke to patient, who said his sx were the same. Instructed him to come to txp clinic for evaluation this afternoon, with understanding that if thoracic surg.appt became available he may be rerouted there. Notified Dr.Serrano re.appt.and he requested patient complete a cxr before his appt. Ordered imaging and instructed patient to come earlier to check in by 1pm to complete this. He verbalized agreeement.

## 2019-01-05 NOTE — Unmapped (Signed)
Patient left msg for coordinator requesting return call to discuss his breathing. Spoke to patient, who said he had been taking the lasix 20mg  bid, and had lost 3 of the 6# he had gained, but he was still experiencing SOB with speaking, walking and mildly at rest. He denied dizziness. Yesterday's labs show significant bump in his Cr. Patient does not have appt with Interventional pulm until 12/1. Spoke with their.nurse navigator, Tammy, who said she would reach out to the on-call provider to see if he could be worked in for an urgent appt and Programmer, systems. Since this coordinator was unable to speak with her again today and patient had not been contacted by Int.Pulm., paged Dr.Gerber, who recommended patient be seen in clinic tomorrow if sx persist or worsen and he is not scheduled yet with Int.Pulm. Spoke with patient and encouraged him to seek urgent medical attn at local or [facility] ED tonight if sx worsen, but he would otherwise be contacted in AM by this coordinator to check on status and to see if appt needed in txp clinic. He verbalized understanding.

## 2019-01-06 ENCOUNTER — Telehealth: Payer: Self-pay

## 2019-01-06 ENCOUNTER — Ambulatory Visit: Payer: BC Managed Care – PPO | Admitting: Family Medicine

## 2019-01-06 LAB — CBC
HEMATOCRIT: 36.6 % — ABNORMAL LOW (ref 41.0–53.0)
HEMOGLOBIN: 11.8 g/dL — ABNORMAL LOW (ref 13.5–17.5)
MEAN CORPUSCULAR HEMOGLOBIN CONC: 32.2 g/dL (ref 31.0–37.0)
MEAN CORPUSCULAR HEMOGLOBIN: 26.5 pg (ref 26.0–34.0)
MEAN CORPUSCULAR VOLUME: 82.1 fL (ref 80.0–100.0)
MEAN PLATELET VOLUME: 7.5 fL (ref 7.0–10.0)
RED BLOOD CELL COUNT: 4.46 10*12/L — ABNORMAL LOW (ref 4.50–5.90)
RED CELL DISTRIBUTION WIDTH: 13.8 % (ref 12.0–15.0)

## 2019-01-06 LAB — GLUCOSE FLUID TYPE

## 2019-01-06 LAB — BASIC METABOLIC PANEL
BLOOD UREA NITROGEN: 24 mg/dL — ABNORMAL HIGH (ref 7–21)
BUN / CREAT RATIO: 22
CALCIUM: 9.1 mg/dL (ref 8.5–10.2)
CHLORIDE: 103 mmol/L (ref 98–107)
CO2: 26 mmol/L (ref 22.0–30.0)
EGFR CKD-EPI AA MALE: 88 mL/min/{1.73_m2} (ref >=60)
EGFR CKD-EPI NON-AA MALE: 76 mL/min/{1.73_m2} (ref >=60)
GLUCOSE RANDOM: 101 mg/dL (ref 70–179)
POTASSIUM: 5.2 mmol/L — ABNORMAL HIGH (ref 3.5–5.0)
SODIUM: 138 mmol/L (ref 135–145)

## 2019-01-06 LAB — TACROLIMUS BLOOD: Lab: 6.3

## 2019-01-06 LAB — MAGNESIUM: Magnesium:MCnc:Pt:Ser/Plas:Qn:: 1.9

## 2019-01-06 LAB — PHOSPHORUS: Phosphate:MCnc:Pt:Ser/Plas:Qn:: 4.1

## 2019-01-06 LAB — SODIUM: Sodium:SCnc:Pt:Ser/Plas:Qn:: 138

## 2019-01-06 LAB — HEMOGLOBIN: Hemoglobin:MCnc:Pt:Bld:Qn:: 11.8 — ABNORMAL LOW

## 2019-01-06 NOTE — Telephone Encounter (Signed)
[place] Night - Client Nonclinical Telephone Record AccessNurse Client [facility] Night - Client Client Site [facility] Physician Renford Dills - MD Contact Type Call Who Is Calling Patient / Member / Family / Caregiver Caller Name [name] Caller Phone Number [phone] Patient Name [name] Patient DOB 04/24/1960 Call Type Message Only Information Provided Reason for Call Request to [facility] Appointment Initial Comment Caller states he needs to cancel his 8:00am appt today as he is in the hospital at [place] Call Closed By: [place] Transaction Date/Time: 01/06/2019 7:38:29 AM (ET)

## 2019-01-06 NOTE — Telephone Encounter (Signed)
appt already cancelled and sending note to Dr Damita Dunnings as Juluis Rainier.

## 2019-01-06 NOTE — Unmapped (Signed)
Brief Operative Note  (CSN: [id number])      Date of Surgery: 01/06/2019    Pre-op Diagnosis: pleural effusion    Post-op Diagnosis: Pleural effusion, right [J90]    Procedure(s):  THORACENTESIS W/ IMAGING: 82956 (CPT??)  Note: Revisions to procedures should be made in chart - see Procedures activity.    Performing Service: Pulmonary  Surgeon(s) and Role:     * Cleora Fleet, MD - Primary     * Rudi Rummage, MD - Fellow - Diagnostic    Assistant: None    Findings: Simple anechoic fluid identified within the right hemithorax. Right sided thoracentesis performed. Please see dedicated procedure note for further details     Anesthesia: Local (Surgeon Admin Only)    Estimated Blood Loss: None    Complications: None    Specimens: None collected    Implants: * No implants in log *    Surgeon Notes: I was present and scrubbed for the entire procedure    Cleora Fleet   Date: 01/06/2019  Time: 11:55 AM

## 2019-01-06 NOTE — Unmapped (Signed)
Change in Tacrolimus dosage decrease. Co-pay $0.00. Previous dose scheduled to fill 01/13/2019. Current dose will need to be added to order.

## 2019-01-06 NOTE — Unmapped (Signed)
Transplant Surgery Progress Note    Hospital Day: 2    Assessment & Plan:   Patient is a 58 y.o. male with cryptogenic cirrhosis and hepatocellular carcinoma s/p OLT 09/16/2018, aortic valve stenosis, type 2 diabetes, autoimmune cholangitis with recent drainage of intra-abdominal fluid collection by VIR, admitted recently 11/2-11/4 for thoracentesis with Interventional Pulmonology on 11/3. CXR from this morning shows recurrance of right sided pleural effusion.   ??  - Regular diet, ML  - Thoracentesis with IP this morning   -F/u fluid cytology/cultures/fluid analysis  - Consult Pulmonology to discuss etiology of recurrent pleural effusion  - F/u ECHO read  - Resume home medication including immunosuppression; restart home lasix  - OOB, IS  - Home CPAP  - SQH  ??   If you have any questions, concerns or changes in the patient's clinical status, please page the [facility] floor pager. [phone]    Interval Events:     NAEON. Afebrile, VSS. This morning he reports he is doing okay, continues to be short of breath and have some pain in the right chest. Potassium up from 4.8 to 5.2 and evidence of fluid overload with recurrent pleural effusion, will restart home lasix. IP drained R pleural effusion, removed 1.0L and sent fluid for full work up. ECHO showed EF >55%, bicuspid aortic valve, and pericardial fat pad but likely interval resolution of pericardial effusion.     Objective:        Vital Signs:  BP 105/72  - Pulse 83  - Temp 36.4 ??C (Oral)  - Resp 16  - SpO2 96%     Input/Output:  I/O last 3 completed shifts:  In: 340 [P.O.:340]  Out: 940 [Urine:940]    Physical Exam:  General: Cooperative, no distress, well appearing   Pulmonary: Normal work of breathing, equal bilateral chest rise.  Cardiac: Regular rate, HDS  Abdomen: Soft, non-distended, non-tender. Well healed mercedes incision from prior liver transplant.   Skin: No jaundice, rashes, erythema, or lesions. Skin color and texture normal. Neurologic: Alert and interactive, no motor abnormalities noted.       Labs:  Reviewed    Imaging:  All pertinent imaging personally reviewed.

## 2019-01-06 NOTE — Unmapped (Signed)
Indications: Right Pleural Effusion     Monitoring:  The patient was monitored continuously by heart rate, blood pressure, pulse oximetry, and EKG tracing.     Procedure Details:      After the risks benefits and alternatives of the procedure were thoroughly explained, informed consent was obtained including the risks of chest pain, cough, bleeding, infection, injury to the lung or orther organ and a pneumothorax requiring chest tube placement. Immediately prior to the procedure, the time out was executed including correct patient identification and agreement on the procedure to be performed.     Using ultrasound guidance a large,hypoechoic, hyperechoic or isoechoic, simple RIGHT pleural effusion was noted.  Images are saved on the Interventional Pulmonary ultrasound machine for future review as needed. The area was prepped with chlorhexadine and draped in sterile fashion.  Following this 15 mL of 1% lidocaine as injected subcutaneously to provide topical anesthesia then deep to the pleura.  A small incision was then made parallel and superior to the rib, the thoracentesis needle with catheter was inserted into the chest wall and advanced under constant aspiration.  Upon aspiration of pleural fluid, the catheter was advanced into the pleural space and the needle was removed.  The catheter was then connected to a drainange bag and the fluid was removed under manual drainage.  The procedure was completed with symptoms of chest pain/pressure The catheter was then removed during slow forced exhalation and bandaged.     Findings:  1000 mL of serosanguinous pleural fluid was removed. The procedure was terminated due to symptoms of chest pain/pressure. The fluid was sent for cytology, glucose, protein, LDH, Cell count and differential, gram stain and culture, albumin or cholesterol.     Estimate Blood Loss:  None    Complications: None         Plan: A follow up chest x-ray is ordered and can be obtained on the floor Cleora Fleet, MD  Interventional Pulmonology

## 2019-01-06 NOTE — Unmapped (Signed)
INTERVENTIONAL PULMONOLOGY INITIAL CONSULT NOTE    Assessment:     58 y.o. male with a past medical history significant for cryptogenic cirrhosis and HCC s/p OLT on 09/16/2018 who presents for dyspnea and recurrent pleural effusions. He was last seen by our service on 11/3 for the same and was found to have and exudative, eosinophilic effusion (46%). He was discharged the following day (11/4) and has progressively developed recurrent symptoms of dyspnea. He does have a peripheral eosinophilia as well (present since just prior to transplant). Differential for eosinophilic effusions includes blood or air in the pleural space or repeat traumas to the pleural space (none present on review), rheumatic effusions, eosinophilic pneumonias, drug reactions (no high risk drugs on MAR review) and malignancy (initial cytology negative). Repeat thoracentesis today 11/13 does not meet criteria for an eosinophilic effusion (eos 7%) but remains exudative.     Plan:     1. Exudative effusion: no longer eosinophilic by criteria  - follow-up pleural fluid cultures and cytology  - recommend serum IgE levels, RF, CCP ANCA  - consider ICID consult for occult infectious work-up in immunocompromised host    This patient was seen and evaluated with Dr. Maple Mirza    History:     Requesting Physician and Service: Dr. Norma Fredrickson, [place]    Reason for Consult: Dyspnea with Pleural effusions HPI: Anthony Alvarado is a 58 y.o. male with a past medical history as noted below who presents with dyspnea in the setting of recurrent pleural effusions. He recently presented with similar symptoms and underwent right sided thoracentesis on 11/3 with 980cc out and improvement in his symptoms. The effusion at that time was exudative and eosinophilic. He reports gradual recurrence of symptoms of dyspnea after discharge on 11/4. He denies fevers, chills or night sweats and does not have weight changes. He does not have chest pain. He has an occasional non-productive cough.    Past Medical History:   Diagnosis Date   ??? Aortic valve stenosis    ??? Autoimmune cholangitis    ??? 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 Cholestatic cirrhosis (CMS-HCC)    ??? Hepatic encephalopathy (CMS-HCC)    ??? Hypertension    ??? Sleep apnea    ??? Type 2 diabetes mellitus (CMS-HCC)        Past Surgical History:   Procedure Laterality Date   ??? APPENDECTOMY     ??? CHG US GUIDE, TISSUE ABLATION N/A 05/03/2018    Procedure: ULTRASOUND GUIDANCE FOR, AND MONITORING OF, PARENCHYMAL TISSUE ABLATION;  Surgeon: Particia Nearing, MD;  Location: [facility] OR [facility]  Service: Transplant   ??? PR CATH PLACE/CORON ANGIO, IMG SUPER/INTERP,R&L HRT CATH, L HRT VENTRIC N/A 03/21/2018    Procedure: Left/Right Heart Catheterization;  Surgeon: Neal Dy, MD;  [facility];  Service: Cardiology   ??? PR EGD FLEXIBLE FOREIGN BODY REMOVAL N/A 12/26/2018    Procedure: UGI ENDOSCOPY; W/REMOVAL FOREIGN BODY;  Surgeon: Vonda Antigua, MD;  Location: [facility];  Service: Gastroenterology   ??? PR LAP,ABLAT 1+ LIVER TUMOR(S),RADIOFREQ N/A 05/03/2018    Procedure: LAPAROSCOPY, SURGICAL, ABLATION OF 1 OR MORE LIVER TUMOR(S); RADIOFREQUENCY;  Surgeon: Particia Nearing, MD;  Location: MAIN OR [facility];  Service: Transplant   ??? PR TRANSPLANT LIVER,ALLOTRANSPLANT Bilateral 09/16/2018 Procedure: LIVER ALLOTRANSPLANTATION; ORTHOTOPIC, PARTIAL OR WHOLE, FROM CADAVER OR LIVING DONOR, ANY AGE;  Surgeon: Florene Glen, MD;  Location: MAIN OR [facility];  Service: Transplant   ???  PR TRANSPLANT,PREP DONOR LIVER, WHOLE N/A 09/16/2018    Procedure: [facility] STD PREP CAD DONOR WHOLE LIVER GFT PRIOR TNSPLNT,INC CHOLE,DISS/REM SURR TISSU WO TRISEG/LOBE SPLT;  Surgeon: Florene Glen, MD;  Location: MAIN OR [place];  Service: Transplant       Current Facility-Administered Medications   Medication Dose Route Frequency Provider Last Rate Last Dose   ??? acetaminophen (TYLENOL) tablet 650 mg  650 mg Oral Q4H PRN Dia Crawford, MD       ??? amLODIPine (NORVASC) tablet 10 mg  10 mg Oral Daily Dia Crawford, MD   10 mg at 01/06/19 0835   ??? aspirin chewable tablet 81 mg  81 mg Oral Daily Dia Crawford, MD   81 mg at 01/06/19 2956   ??? calcium carbonate (TUMS) chewable tablet 200 mg elem calcium  200 mg elem calcium Oral TID PRN Dia Crawford, MD       ??? fluticasone propionate (FLONASE) 50 mcg/actuation nasal spray 1 spray  1 spray Each Nare Daily Dia Crawford, MD       ??? gabapentin (NEURONTIN) capsule 300 mg  300 mg Oral BID Dia Crawford, MD   300 mg at 01/06/19 0835   ??? heparin (porcine) injection 5,000 Units  5,000 Units Subcutaneous [facility] Dia Crawford, MD   5,000 Units at 01/06/19 0558   ??? levothyroxine (SYNTHROID) tablet 137 mcg  137 mcg Oral daily Dia Crawford, MD   137 mcg at 01/06/19 0558   ??? mycophenolate (MYFORTIC) EC tablet 360 mg  360 mg Oral BID Dia Crawford, MD   360 mg at 01/06/19 0908   ??? ondansetron (ZOFRAN-ODT) disintegrating tablet 4 mg  4 mg Oral Q6H PRN Dia Crawford, MD       ??? oxyCODONE (ROXICODONE) immediate release tablet 5 mg  5 mg Oral Q6H PRN Dia Crawford, MD   5 mg at 01/05/19 2113   ??? sertraline (ZOLOFT) tablet 25 mg  25 mg Oral Daily Dia Crawford, MD   25 mg at 01/06/19 2130 ??? sulfamethoxazole-trimethoprim (BACTRIM) 400-80 mg tablet 80 mg of trimethoprim  1 tablet Oral 3x weekly Dia Crawford, MD   80 mg of trimethoprim at 01/06/19 0908   ??? tacrolimus (PROGRAF) capsule 1.5 mg  1.5 mg Oral BID Dia Crawford, MD   1.5 mg at 01/06/19 0907     Facility-Administered Medications Ordered in Other Encounters   Medication Dose Route Frequency Provider Last Rate Last Dose   ??? diazePAM (VALIUM) 5 mg/mL injection                Allergies as of 01/05/2019 - Reviewed 01/05/2019   Allergen Reaction Noted   ??? Watermelon flavor  04/29/2016       Family History   Problem Relation Age of Onset   ??? Asthma Mother    ??? COPD Father    ??? Cancer Father    ??? Autoimmune disease Sister         AIH   ??? Heart disease Sister    ??? Colon cancer Maternal Grandfather        Social History     Socioeconomic History   ??? Marital status: Legally Separated     Spouse name: Not on file   ??? Number of children: Not on file   ??? Years of education: Not on file   ??? Highest education level: Not on file   Occupational History   ??? Not on file   Social Needs   ??? Financial resource strain: Not  on file   ??? Food insecurity     Worry: Not on file     Inability: Not on file   ??? Transportation needs     Medical: Not on file     Non-medical: Not on file   Tobacco Use   ??? Smoking status: Never Smoker   ??? Smokeless tobacco: Never Used   Substance and Sexual Activity   ??? Alcohol use: Not Currently   ??? Drug use: Never   ??? Sexual activity: Not on file   Lifestyle   ??? Physical activity     Days per week: Not on file     Minutes per session: Not on file   ??? Stress: Not on file   Relationships   ??? Social Wellsite geologist on phone: Not on file     Gets together: Not on file     Attends religious service: Not on file     Active member of club or organization: Not on file     Attends meetings of clubs or organizations: Not on file     Relationship status: Not on file   Other Topics Concern   ??? Not on file   Social History Narrative   ??? Not on file Review of Systems  A 12 point review of systems was negative except for pertinent items noted in the HPI.    Objective:     Physical Examination:   BP 127/93  - Pulse 76  - Temp 36.7 ??C (Oral)  - Resp 16  - SpO2 95%     General appearance - alert, well appearing, and in no distress    Eyes - Sclera anicteric, conjunctiva pink  Mouth - mucous membranes moist, pharynx normal without lesions  Neck - Trachea supple and midline, (-) JVD   Lymphatics - no palpable lymphadenopathy  Heart - normal rate, regular rhythm, normal S1, S2, no murmurs, rubs, clicks or gallops  Chest - decreased breath sounds in the bases (more so on the right), no wheezes  Abdomen - soft, nontender, nondistended, no masses or organomegaly  Extremities - No pedal edema, no clubbing or cyanosis  Skin - normal coloration and turgor, no rashes, no suspicious skin lesions noted  Neurological - alert, oriented, normal speech, no focal findings or movement disorder noted    Labs and Imaging:    1. Xr Chest 1 View 01/06/2019  Slight interval decrease in moderate right pleural effusion. Linear right basilar opacities, likely atelectasis. Small left pleural effusion, unchanged. No pneumothorax. Stable partially obscured cardiomediastinal silhouette.     Slight interval decrease a moderate right pleural effusion. Small left pleural effusion, unchanged.

## 2019-01-06 NOTE — Unmapped (Signed)
TRANSPLANT SURGERY PROGRESS NOTE    Assessment and Plan      Subjective  Anthony Alvarado is a 58 y.o. male with OLT 100 days ago with SOB has pulmonary effusion, was admitted 2 weeks ago and had fluid drained from chest, it has re accumulated, he also has pericardial effusion. He will be addmitted for treatment      Objective    Vitals:    01/05/19 1353   BP: 106/84   Pulse: 89   Temp: 37.2 ??C (99 ??F)   TempSrc: Tympanic   Weight: 81.4 kg (179 lb 6.4 oz)   Height: 172.7 cm (5' 8)      Body mass index is 27.28 kg/m??.     Physical Exam:    General Appearance:   No acute distress  Lungs:                Diminished breath souunds on the right side  Heart:                           Regular rate and rhythm  Abdomen:                Soft, non-tender, non-distended  Extremities:              Warm and well perfused        Data Review:  All lab results last 24 hours:    Recent Results (from the past 24 hour(s))   Comprehensive metabolic panel    Collection Time: 01/05/19  5:48 PM   Result Value Ref Range    Sodium 137 135 - 145 mmol/L    Potassium 4.8 3.5 - 5.0 mmol/L    Chloride 100 98 - 107 mmol/L    Anion Gap 12 7 - 15 mmol/L    CO2 25.0 22.0 - 30.0 mmol/L    BUN 31 (H) 7 - 21 mg/dL    Creatinine 1.61 0.96 - 1.30 mg/dL    BUN/Creatinine Ratio 25     EGFR CKD-EPI Non-African American, Male 49 >=60 mL/min/1.51m2    EGFR CKD-EPI African American, Male 34 >=60 mL/min/1.43m2    Glucose 147 70 - 179 mg/dL    Calcium 9.0 8.5 - 04.5 mg/dL    Albumin 3.8 3.5 - 5.0 g/dL    Total Protein 6.5 6.5 - 8.3 g/dL    Total Bilirubin 0.4 0.0 - 1.2 mg/dL    AST 20 19 - 55 U/L    ALT 14 <50 U/L    Alkaline Phosphatase 103 38 - 126 U/L   CBC w/ Differential    Collection Time: 01/05/19  5:48 PM   Result Value Ref Range    WBC 15.5 (H) 4.5 - 11.0 10*9/L    RBC 4.73 4.50 - 5.90 10*12/L    HGB 12.2 (L) 13.5 - 17.5 g/dL    HCT 40.9 (L) 81.1 - 53.0 %    MCV 82.6 80.0 - 100.0 fL    MCH 25.7 (L) 26.0 - 34.0 pg    MCHC 31.1 31.0 - 37.0 g/dL    RDW 91.4 78.2 - 95.6 %    MPV 8.3 7.0 - 10.0 fL    Platelet 456 (H) 150 - 440 10*9/L    Neutrophils % 74.1 %    Lymphocytes % 10.8 %    Monocytes % 9.7 %    Eosinophils % 3.6 %    Basophils % 0.8 %    Absolute Neutrophils  11.5 (H) 2.0 - 7.5 10*9/L    Absolute Lymphocytes 1.7 1.5 - 5.0 10*9/L    Absolute Monocytes 1.5 (H) 0.2 - 0.8 10*9/L    Absolute Eosinophils 0.6 (H) 0.0 - 0.4 10*9/L    Absolute Basophils 0.1 0.0 - 0.1 10*9/L    Large Unstained Cells 1 0 - 4 %    Microcytosis Slight (A) Not Present    Hypochromasia Moderate (A) Not Present   COVID-19 PCR    Collection Time: 01/05/19  6:48 PM    Specimen: Nasopharyngeal Swab   Result Value Ref Range    SARS-CoV-2 PCR Not Detected Not Detected   ECG 12 Lead    Collection Time: 01/05/19  7:19 PM   Result Value Ref Range    EKG Systolic BP  mmHg    EKG Diastolic BP  mmHg    EKG Ventricular Rate 88 BPM    EKG Atrial Rate 88 BPM    EKG P-R Interval 146 ms    EKG QRS Duration 90 ms    EKG Q-T Interval 380 ms    EKG QTC Calculation 459 ms    EKG Calculated P Axis 16 degrees    EKG Calculated R Axis -4 degrees    EKG Calculated T Axis -20 degrees    QTC Fredericia 431 ms         Imaging:

## 2019-01-06 NOTE — Unmapped (Signed)
See procedure note.

## 2019-01-06 NOTE — Unmapped (Signed)
Tacrolimus Therapeutic Monitoring Pharmacy Note    Anthony Alvarado is a 58 y.o. male continuing tacrolimus.     Indication: Liver transplant     Date of Transplant: 09/16/18      Prior Dosing Information: Current regimen tacrolimus 1.5 mg twice daily      Goals:  Therapeutic Drug Levels  Tacrolimus trough goal: 6-8 ng/mL    Additional Clinical Monitoring/Outcomes  ?? Monitor renal function (SCr and urine output) and liver function (LFTs)  ?? Monitor for signs/symptoms of adverse events (e.g., hyperglycemia, hyperkalemia, hypomagnesemia, hypertension, headache, tremor)    Results:   Tacrolimus level: 6.3 ng/mL, drawn appropriately    Pharmacokinetic Considerations and Significant Drug Interactions:  ? Concurrent hepatotoxic medications: None identified  ? Concurrent CYP3A4 substrates/inhibitors: amlodipine   ? Concurrent nephrotoxic medications: None identified    Assessment/Plan:  Recommendedation(s)  ? Continue current regimen of tacrolimus 1.5 mg twice daily    Follow-up  ? Tacrolimus levels daily with AM labs..   ? A pharmacist will continue to monitor and recommend levels as appropriate    Please page service pharmacist with questions/clarifications.    Crista Curb, PharmD

## 2019-01-06 NOTE — Unmapped (Signed)
Admitted from clinic for recurrence of pleural effusion, (+) coarse crackles on right lung field, saturation within normal limits on room air. Cpap at HS as per patient setting at home. Oxycodone 5 mg PO given for pain  with relief. Vitla signs within normal limits. Needs attended will monitor.  Problem: Adult Inpatient Plan of Care  Goal: Plan of Care Review  Outcome: Progressing  Goal: Patient-Specific Goal (Individualization)  Outcome: Progressing  Goal: Absence of Hospital-Acquired Illness or Injury  Outcome: Progressing  Goal: Optimal Comfort and Wellbeing  Outcome: Progressing  Goal: Readiness for Transition of Care  Outcome: Progressing  Goal: Rounds/Family Conference  Outcome: Progressing     Problem: Pain Acute  Goal: Optimal Pain Control  Outcome: Progressing

## 2019-01-06 NOTE — Unmapped (Signed)
Problem: Adult Inpatient Plan of Care  Goal: Plan of Care Review  Outcome: Progressing  Goal: Patient-Specific Goal (Individualization)  Outcome: Progressing  Goal: Absence of Hospital-Acquired Illness or Injury  Outcome: Progressing  Goal: Optimal Comfort and Wellbeing  Outcome: Progressing  Goal: Readiness for Transition of Care  Outcome: Progressing  Goal: Rounds/Family Conference  Outcome: Progressing     Problem: Pain Acute  Goal: Optimal Pain Control  Outcome: Progressing   Pt remains alert and coherent. Tolerating diet well. Returned from thoracentesis and reported good pain relief from Oxycodone breathing better.

## 2019-01-06 NOTE — Unmapped (Addendum)
11/20: pt is currently admitted but spoke to patient's wife/caregiver Tammy today as meds were scheduled per previous refill note to fill today for Monday delivery. Tammy states they expect him home by Monday/delivery day and if not she would be there to get package. As he is low on meds, she requests we fill as previously set up for Monday delivery -ef    [facility] Clinical Assessment & Refill Coordination Note    Anthony Alvarado, DOB: 1960-07-03  Phone: [phone] (home)     All above HIPAA information was verified with patient.     Specialty Medication(s):   Transplant:  mycophenolic acid 180mg  and tacrolimus 0.5\mg     No current facility-administered medications for this visit.      No current outpatient medications on file.     Facility-Administered Medications Ordered in Other Visits   Medication Dose Route Frequency Provider Last Rate Last Dose   ??? acetaminophen (TYLENOL) tablet 650 mg  650 mg Oral Q4H PRN Dia Crawford, MD       ??? amLODIPine (NORVASC) tablet 10 mg  10 mg Oral Daily Dia Crawford, MD   10 mg at 01/06/19 0835   ??? aspirin chewable tablet 81 mg  81 mg Oral Daily Dia Crawford, MD   81 mg at 01/06/19 0981   ??? calcium carbonate (TUMS) chewable tablet 200 mg elem calcium  200 mg elem calcium Oral TID PRN Dia Crawford, MD       ??? diazePAM (VALIUM) 5 mg/mL injection            ??? fluticasone propionate (FLONASE) 50 mcg/actuation nasal spray 1 spray  1 spray Each Nare Daily Dia Crawford, MD       ??? gabapentin (NEURONTIN) capsule 300 mg  300 mg Oral BID Dia Crawford, MD   300 mg at 01/06/19 0835   ??? heparin (porcine) injection 5,000 Units  5,000 Units Subcutaneous [facility] Dia Crawford, MD   5,000 Units at 01/06/19 0558   ??? levothyroxine (SYNTHROID) tablet 137 mcg  137 mcg Oral daily Dia Crawford, MD   137 mcg at 01/06/19 0558   ??? mycophenolate (MYFORTIC) EC tablet 360 mg  360 mg Oral BID Dia Crawford, MD   360 mg at 01/06/19 0908 ??? ondansetron (ZOFRAN-ODT) disintegrating tablet 4 mg  4 mg Oral Q6H PRN Dia Crawford, MD       ??? oxyCODONE (ROXICODONE) immediate release tablet 5 mg  5 mg Oral Q6H PRN Dia Crawford, MD   5 mg at 01/05/19 2113   ??? sertraline (ZOLOFT) tablet 25 mg  25 mg Oral Daily Dia Crawford, MD   25 mg at 01/06/19 1914   ??? sulfamethoxazole-trimethoprim (BACTRIM) 400-80 mg tablet 80 mg of trimethoprim  1 tablet Oral 3x weekly Dia Crawford, MD   80 mg of trimethoprim at 01/06/19 0908   ??? tacrolimus (PROGRAF) capsule 1.5 mg  1.5 mg Oral BID Dia Crawford, MD   1.5 mg at 01/06/19 0907        Changes to medications: see tac below    Allergies   Allergen Reactions   ??? Watermelon Flavor      Mouth itching       Changes to allergies: No    SPECIALTY MEDICATION ADHERENCE     Tacrolimus 0.5mg   : 12 days of medicine on hand   Mycophenolate 180mg   : 12 days of medicine on hand     Medication Adherence    Patient reported X missed  doses in the last month: 0  Specialty Medication: tacrolimus 0.5mg   Patient is on additional specialty medications: Yes  Additional Specialty Medications: Mycophenolate 180mg   Patient Reported Additional Medication X Missed Doses in the Last Month: 0          Specialty medication(s) dose(s) confirmed: Patient reports changes to the regimen as follows: tac is now 3 capsules (1.5mg ) twice daily - pt aware and new rx on file     Are there any concerns with adherence? No    Adherence counseling provided? Not needed    CLINICAL MANAGEMENT AND INTERVENTION      Clinical Benefit Assessment:    Do you feel the medicine is effective or helping your condition? Yes    Clinical Benefit counseling provided? Not needed    Adverse Effects Assessment:    Are you experiencing any side effects? No    Are you experiencing difficulty administering your medicine? No    Quality of Life Assessment: How many days over the past month did your transplant  keep you from your normal activities? For example, brushing your teeth or getting up in the morning. 0    Have you discussed this with your provider? Not needed    Therapy Appropriateness:    Is therapy appropriate? Yes, therapy is appropriate and should be continued    DISEASE/MEDICATION-SPECIFIC INFORMATION      N/A    PATIENT SPECIFIC NEEDS     ? Does the patient have any physical, cognitive, or cultural barriers? No    ? Is the patient high risk? Yes, patient taking a REMS drug     ? Does the patient require a Care Management Plan? No     ? Does the patient require physician intervention or other additional services (i.e. nutrition, smoking cessation, social work)? No      SHIPPING     Specialty Medication(s) to be Shipped:   na    Other medication(s) to be shipped: na     Changes to insurance: No    Delivery Scheduled: order already set up by another team member. just adding this new dose to the order in place of old rx     Medication will be delivered via na to the confirmed na address in [place].    The patient will receive a drug information handout for each medication shipped and additional FDA Medication Guides as required.  Verified that patient has previously received a Conservation officer, historic buildings.    All of the patient's questions and concerns have been addressed.    [name]   [facility] Specialty Pharmacist

## 2019-01-06 NOTE — Telephone Encounter (Signed)
Noted. Thanks.  Will await the inpatient notes.

## 2019-01-07 LAB — PHOSPHORUS: Phosphate:MCnc:Pt:Ser/Plas:Qn:: 4.5

## 2019-01-07 LAB — RHEUMATOID FACTOR: Rheumatoid factor:ACnc:Pt:Ser/Plas:Qn:: <8.6

## 2019-01-07 LAB — CBC
HEMATOCRIT: 38.2 % — ABNORMAL LOW (ref 41.0–53.0)
HEMOGLOBIN: 11.9 g/dL — ABNORMAL LOW (ref 13.5–17.5)
MEAN CORPUSCULAR HEMOGLOBIN CONC: 31.2 g/dL (ref 31.0–37.0)
MEAN PLATELET VOLUME: 7.3 fL (ref 7.0–10.0)
PLATELET COUNT: 456 10*9/L — ABNORMAL HIGH (ref 150–440)
RED CELL DISTRIBUTION WIDTH: 13.9 % (ref 12.0–15.0)
WBC ADJUSTED: 11.7 10*9/L — ABNORMAL HIGH (ref 4.5–11.0)

## 2019-01-07 LAB — BASIC METABOLIC PANEL
ANION GAP: 7 mmol/L (ref 7–15)
BLOOD UREA NITROGEN: 22 mg/dL — ABNORMAL HIGH (ref 7–21)
BUN / CREAT RATIO: 19
CALCIUM: 8.7 mg/dL (ref 8.5–10.2)
CHLORIDE: 103 mmol/L (ref 98–107)
CO2: 26 mmol/L (ref 22.0–30.0)
CREATININE: 1.13 mg/dL (ref 0.70–1.30)
EGFR CKD-EPI AA MALE: 84 mL/min/{1.73_m2} (ref >=60)
GLUCOSE RANDOM: 115 mg/dL (ref 70–179)
POTASSIUM: 4.9 mmol/L (ref 3.5–5.0)
SODIUM: 136 mmol/L (ref 135–145)

## 2019-01-07 LAB — MEAN CORPUSCULAR HEMOGLOBIN CONC: Lab: 31.2

## 2019-01-07 LAB — TACROLIMUS BLOOD: Lab: 6

## 2019-01-07 LAB — MAGNESIUM: Magnesium:MCnc:Pt:Ser/Plas:Qn:: 1.8

## 2019-01-07 LAB — CALCIUM: Calcium:MCnc:Pt:Ser/Plas:Qn:: 8.7

## 2019-01-07 NOTE — Unmapped (Addendum)
Patient is a??58 y.o.??male??with cryptogenic cirrhosis and hepatocellular carcinoma??s/p??OLT 09/16/2018, aortic valve stenosis, type 2 diabetes, autoimmune cholangitis with recent drainage of intra-abdominal fluid collection by VIR, admitted recently 11/2-11/4 for thoracentesis with Interventional Pulmonology on 11/3. Since his discharge, he had progressive shortness of breath and chest discomfort and CXR on 11/12 showed recurrance of right sided pleural effusion. Interventional Pulmonolgy performed thoracentesis on 11/13 and evacuated 1L of serosangeunous fluid; fluid removal was limited by patient chest pressure with progressive drainage. ECHO performed while inpatient showed resolution of pericardial effusion and EF >55%. Pleural fluid analysis showed eosiniphillic predominance. He redeveloped a pleural effusion, now with loculations.    He underwent pigtail catheter placement by interventional pulmonology on 11/18 with 2L of output. Subsequent Thoracic Surgery consult in the setting of multiple pleural effusions now with loculations and a residual basilar pneumothorax in the right lung determined patient would benefit from operative intervention. He underwent Right pulmonary decortication and pleurodesis on 01/13/2019. CT was placed to water seal on POD2, and removed on POD3. Patient did well, and post pull CXR was unremarkable. He will follow with SRT in outpatient clinic 2 weeks following discharge.    On hospital day 11, the patient had new onset of periauricular numbness and tingling as well as right eye blurriness. Neurology was consulted, who were initially concered for a central neurologic process. MRI brain and spine WWO contrast were ordered, which demonstrated no acute or chronic findings. Determined that symptoms most likely related to wearing CPAP mask at night, given negative imaging findings. He will pursue outpatient follow up with his opthalmologist for workup of his blurry vision. He is being discharged on HD 12 in stable condition at baseline ambulatory status, bowel and bladder function, tolerating a regular diet, and with pain well controlled on PO medication. He will have planned outpatient follow up in one week.

## 2019-01-07 NOTE — Unmapped (Signed)
Oxycodone 5 mg Po given x1 during the night for complain of back pain with relief. Vital signs within normal limits. Cpap at HS. Needs attended will monitor.  Problem: Adult Inpatient Plan of Care  Goal: Plan of Care Review  Outcome: Progressing  Goal: Patient-Specific Goal (Individualization)  Outcome: Progressing  Goal: Absence of Hospital-Acquired Illness or Injury  Outcome: Progressing  Goal: Optimal Comfort and Wellbeing  Outcome: Progressing  Goal: Readiness for Transition of Care  Outcome: Progressing  Goal: Rounds/Family Conference  Outcome: Progressing     Problem: Pain Acute  Goal: Optimal Pain Control  Outcome: Progressing     Problem: Noninvasive Ventilation Acute  Goal: Effective Unassisted Ventilation and Oxygenation  Outcome: Progressing

## 2019-01-07 NOTE — Unmapped (Signed)
Pe is stable denies SOB or chest pain or any other pain. He is r/a and sats above 95%. POC was discussed with pt and no questions asked,. Pt in NAD. Will continue to monitor.   Vitals:    01/07/19 1135   BP: 102/75   Pulse: 78   Resp: 18   Temp: 36.5 ??C (97.7 ??F)   SpO2: 97%       Problem: Adult Inpatient Plan of Care  Goal: Plan of Care Review  Outcome: Progressing  Goal: Patient-Specific Goal (Individualization)  Outcome: Progressing  Goal: Absence of Hospital-Acquired Illness or Injury  Outcome: Progressing  Goal: Optimal Comfort and Wellbeing  Outcome: Progressing  Goal: Readiness for Transition of Care  Outcome: Progressing  Goal: Rounds/Family Conference  Outcome: Progressing     Problem: Pain Acute  Goal: Optimal Pain Control  Outcome: Progressing     Problem: Noninvasive Ventilation Acute  Goal: Effective Unassisted Ventilation and Oxygenation  Outcome: Progressing     Problem: Obstructive Sleep Apnea Risk or Actual (Comorbidity Management)  Goal: Unobstructed Breathing During Sleep  Outcome: Progressing

## 2019-01-07 NOTE — Unmapped (Signed)
Transplant Surgery Progress Note    Hospital Day: 3    Assessment & Plan:   Patient is a 58 y.o. male with cryptogenic cirrhosis and hepatocellular carcinoma s/p OLT 09/16/2018, aortic valve stenosis, type 2 diabetes, autoimmune cholangitis with recent drainage of intra-abdominal fluid collection by VIR, admitted recently 11/2-11/4 for thoracentesis with Interventional Pulmonology on 11/3. CXR from this morning shows recurrance of right sided pleural effusion now status post thoracentesis on 11/13.  ??  - Regular diet, ML  - Thoracentesis with IP: F/u fluid cytology/cultures/fluid analysis  - Consult ICID due to eosinophilia and concern for parasitic infecion  - Resume home medication including immunosuppression; restart home lasix  - OOB, IS  - Home CPAP  - SQH  ??   If you have any questions, concerns or changes in the patient's clinical status, please page the [facility] floor pager. [phone]    Interval Events:     NAEON.     Objective:        Vital Signs:  BP 120/92  - Pulse 82  - Temp 36.5 ??C (Oral)  - Resp 18  - SpO2 98%     Input/Output:  I/O last 3 completed shifts:  In: 1160 [P.O.:1160]  Out: 3140 [Urine:3140]    Physical Exam:  General: Cooperative, no distress, well appearing   Pulmonary: Normal work of breathing, equal bilateral chest rise.  Cardiac: Regular rate, HDS  Abdomen: Soft, non-distended, non-tender. Well healed mercedes incision from prior liver transplant.   Skin: No jaundice, rashes, erythema, or lesions. Skin color and texture normal.  Neurologic: Alert and interactive, no motor abnormalities noted.       Labs:  Reviewed    Imaging:  All pertinent imaging personally reviewed.

## 2019-01-07 NOTE — Unmapped (Signed)
Patient was compliant with using CPAP this shift with no distress noted.

## 2019-01-08 LAB — BASIC METABOLIC PANEL
ANION GAP: 8 mmol/L (ref 7–15)
BUN / CREAT RATIO: 16
CALCIUM: 8.5 mg/dL (ref 8.5–10.2)
CHLORIDE: 103 mmol/L (ref 98–107)
CO2: 26 mmol/L (ref 22.0–30.0)
CREATININE: 1.22 mg/dL (ref 0.70–1.30)
EGFR CKD-EPI AA MALE: 76 mL/min/{1.73_m2} (ref >=60)
EGFR CKD-EPI NON-AA MALE: 66 mL/min/{1.73_m2} (ref >=60)
GLUCOSE RANDOM: 94 mg/dL (ref 70–179)
SODIUM: 137 mmol/L (ref 135–145)

## 2019-01-08 LAB — CBC
HEMATOCRIT: 34.9 % — ABNORMAL LOW (ref 41.0–53.0)
HEMOGLOBIN: 11.1 g/dL — ABNORMAL LOW (ref 13.5–17.5)
MEAN CORPUSCULAR HEMOGLOBIN CONC: 31.7 g/dL (ref 31.0–37.0)
MEAN CORPUSCULAR HEMOGLOBIN: 26.2 pg (ref 26.0–34.0)
MEAN CORPUSCULAR VOLUME: 82.5 fL (ref 80.0–100.0)
PLATELET COUNT: 417 10*9/L (ref 150–440)
RED BLOOD CELL COUNT: 4.23 10*12/L — ABNORMAL LOW (ref 4.50–5.90)
WBC ADJUSTED: 10.8 10*9/L (ref 4.5–11.0)

## 2019-01-08 LAB — EGFR CKD-EPI AA MALE: Lab: 76

## 2019-01-08 LAB — MEAN PLATELET VOLUME: Lab: 8

## 2019-01-08 LAB — PHOSPHORUS: Phosphate:MCnc:Pt:Ser/Plas:Qn:: 4.6

## 2019-01-08 LAB — MAGNESIUM: Magnesium:MCnc:Pt:Ser/Plas:Qn:: 1.7

## 2019-01-08 LAB — TACROLIMUS BLOOD: Lab: 7.8

## 2019-01-08 NOTE — Unmapped (Signed)
Problem: Noninvasive Ventilation Acute  Goal: Effective Unassisted Ventilation and Oxygenation  Outcome: Ongoing - Unchanged  Intervention: Monitor and Manage Noninvasive Ventilation  Flowsheets (Taken 01/08/2019 0450)  Airway/Ventilation Management:   airway patency maintained   humidification applied  NPPV/CPAP Maintenance:   mask secure   tubes secured     Problem: Obstructive Sleep Apnea Risk or Actual (Comorbidity Management)  Goal: Unobstructed Breathing During Sleep  Outcome: Ongoing - Unchanged  Intervention: Monitor and Manage Obstructive Sleep Apnea  Flowsheets (Taken 01/08/2019 0450)  NPPV/CPAP Maintenance:   mask secure   tubes secured  Patient continues to be compliant with using CPAP HS.

## 2019-01-08 NOTE — Unmapped (Signed)
AXO. VSS. Calm and cooperative with care and very polite. Pain managed with Oxy/Tylenol with some effect. No dyspnea or discomfort verbalized or observed. Ate his dinner,took all his HS medicines. Skin,falls, pain protocols continued and reviewed. Bed in low position and locked. Call bell, phone and water in reach. CPAP on and he is sleeping at this time. Continue to monitor.  Problem: Adult Inpatient Plan of Care  Goal: Plan of Care Review  01/08/2019 0151 by Cleta Alberts, RN  Outcome: Ongoing - Unchanged  01/08/2019 0151 by Cleta Alberts, RN  Flowsheets (Taken 01/08/2019 0151)  Progress: improving  Plan of Care Reviewed With: patient  Goal: Patient-Specific Goal (Individualization)  Outcome: Ongoing - Unchanged  Goal: Absence of Hospital-Acquired Illness or Injury  Outcome: Ongoing - Unchanged  Goal: Optimal Comfort and Wellbeing  Outcome: Ongoing - Unchanged  Goal: Readiness for Transition of Care  Outcome: Ongoing - Unchanged  Goal: Rounds/Family Conference  Outcome: Ongoing - Unchanged     Problem: Pain Acute  Goal: Optimal Pain Control  Outcome: Ongoing - Unchanged     Problem: Noninvasive Ventilation Acute  Goal: Effective Unassisted Ventilation and Oxygenation  Outcome: Ongoing - Unchanged     Problem: Obstructive Sleep Apnea Risk or Actual (Comorbidity Management)  Goal: Unobstructed Breathing During Sleep  Outcome: Ongoing - Unchanged

## 2019-01-08 NOTE — Unmapped (Signed)
INTERVENTIONAL PULMONOLOGY FOLLOW-UP CONSULT NOTE    Assessment:     [name] is a 58 year old male with history of cryptogenic cirrhosis and HCC s/p orthotopic liver transplant 09/16/2018 who is being seen for recurrent right sided exudative plural effusion.  Effusion had eosinophil predominance on 11/3 but pleural fluid eosinophil count is decreasing as of thoracentesis yesterday.  Pleural fluid cytology and cultures are pending although cultures are negative to date. Further evaluation for peripheral eosinophilia is pending.     Differential for his pleural effusion is quite broad and includes an inflammatory process related to his prior intra-abdominal fluid collections, neoplastic process (cytology was negative in early November), connective tissue disease, indolent infectious process. Will await pleural fluid study results.     Plan:     1. Right sided exudative pleural effusion, unclear cause: will await pleural fluid cultures and cytology.     2. Please obtain noncontrast CT chest to further evaluate the pleural space.    Please call the Interventional Pulmonary pager at [phone] with any questions.    Zhaire Locker R. Ragna Kramlich, DO  Assistant Professor of Medicine  Interventional Pulmonology  Division of Pulmonary Diseases and Critical Care Medicine      Subjective:     S: [name] reports improvement in shortness of breath after thoracentesis yesterday but says he feels the fluid is building up again. He is able to walk around the unit but     Objective:     Physical Examination:     BP 116/87  - Pulse 82  - Temp 36.8 ??C (98.2 ??F) (Oral)  - Resp 18  - SpO2 96%    General appearance - alert, well appearing, and in no distress  Heart - no murmurs noted  Chest - decreased breath sounds right base  Abdomen - soft, no guarding or rebound  Extremities - No pedal edema, no clubbing or cyanosis    Labs and Imaging:    Chest X-ray 01/06/2019 post thoracentesis: Slight interval decrease in moderate right pleural effusion. Linear right basilar opacities, likely atelectasis.  ??  Small left pleural effusion, unchanged. No pneumothorax.  ??  Stable partially obscured cardiomediastinal silhouette.   ??  ??  IMPRESSION:  ??  Slight interval decrease a moderate right pleural effusion.  ??  Small left pleural effusion, unchanged.

## 2019-01-08 NOTE — Unmapped (Signed)
Transplant Surgery Progress Note    Hospital Day: 4    Assessment & Plan:   Patient is a 58 y.o. male with cryptogenic cirrhosis and hepatocellular carcinoma s/p OLT 09/16/2018, aortic valve stenosis, type 2 diabetes, autoimmune cholangitis with recent drainage of intra-abdominal fluid collection by VIR, admitted recently 11/2-11/4 for thoracentesis with Interventional Pulmonology on 11/3. CXR from this morning shows recurrance of right sided pleural effusion now status post thoracentesis on 11/13.  ??  - Regular diet, ML  - Thoracentesis with IP: F/u fluid cytology/cultures/fluid analysis  - CT chest without contrast ordered to further evaluate   - Appreciate ICID recommendations. Additional labs ordered. Ivermectin given.  - Resume home medication including immunosuppression and home lasix  - OOB, IS  - Home CPAP  - SQH  ??   If you have any questions, concerns or changes in the patient's clinical status, please page the [facility] floor pager. [phone]    Interval Events:     NAEON, although has sensation that fluid is re accumulating.     Objective:        Vital Signs:  BP 108/84  - Pulse 75  - Temp 36.5 ??C (Oral)  - Resp 18  - SpO2 96%     Input/Output:  I/O last 3 completed shifts:  In: 1120 [P.O.:1120]  Out: 3580 [Urine:3580]    Physical Exam:  General: Cooperative, no distress, well appearing   Pulmonary: Normal work of breathing, equal bilateral chest rise.  Cardiac: Regular rate, HDS  Abdomen: Soft, non-distended, non-tender. Well healed mercedes incision from   Skin: No jaundice, rashes, erythema, or lesions. Skin color and texture normal.  Neurologic: Alert and interactive, no motor abnormalities noted.       Labs:  Reviewed    Imaging:  All pertinent imaging personally reviewed.

## 2019-01-08 NOTE — Unmapped (Signed)
Tacrolimus Therapeutic Monitoring Pharmacy Note    Anthony Alvarado is a 58 y.o. male continuing tacrolimus.     Indication: Liver transplant     Date of Transplant: 09/16/18      Prior Dosing Information: Current regimen tacrolimus 1.5 mg twice daily      Goals:  Therapeutic Drug Levels  Tacrolimus trough goal: 6-8 ng/mL    Additional Clinical Monitoring/Outcomes  ?? Monitor renal function (SCr and urine output) and liver function (LFTs)  ?? Monitor for signs/symptoms of adverse events (e.g., hyperglycemia, hyperkalemia, hypomagnesemia, hypertension, headache, tremor)    Results:   Tacrolimus level: 7.8 ng/mL, drawn appropriately    Pharmacokinetic Considerations and Significant Drug Interactions:  ? Concurrent hepatotoxic medications: None identified  ? Concurrent CYP3A4 substrates/inhibitors: amlodipine   ? Concurrent nephrotoxic medications: None identified    Assessment/Plan:  Recommendedation(s)  ? Continue current regimen of tacrolimus 1.5 mg twice daily    Follow-up  ? Tacrolimus levels daily with AM labs..   ? A pharmacist will continue to monitor and recommend levels as appropriate    Please page service pharmacist with questions/clarifications.    Ester Rink, PharmD  PGY2 Solid Organ Transplant Pharmacy Resident

## 2019-01-08 NOTE — Unmapped (Signed)
Pt remains stable, sats WNL on r/a. He denies pain, but aware that pain meds are available.CT chest ordered and scheduled for this afternoon.

## 2019-01-08 NOTE — Unmapped (Addendum)
NEW IMMUNOCOMPROMISED HOST INFECTIOUS DISEASE CONSULT NOTE      Anthony Alvarado is being seen in consultation at the request of [facility] for evaluation of eosinophilic pleural effusion and peripheral eosinophilia.      Assessment/Recommendations:     Anthony Alvarado is a 58 y.o. male with PMHx including ?autoimmune cholangitis, cryptogenic cirrhosis and HCC s/p OLT on 09/16/18, IDDM2, bicuspid AV and asthma who presented on 11/12 for SOB found to have recurrent pleural effusion. Wide differential for eosinophilic effusion and peripheral eosinophilia however patient's lack of significant travel outside of [place] eliminates most potential infectious causes. Based on exposures, DDx includes ABPA/Aspergillus (esp in light of asthma a/w allergies), strongy, cocci, histo, and TB. No remarkable donor exposures though was noted to have recurrent pneumonias - unlikely to be contributing. Given history of autoimmune disease and FHx of this (sister also with AI liver disease), would consider rheum and hematology consult as well. Medications including bactrim can also cause peripheral eos and could consider changing to different PCP ppx to eliminate this as a cause.       ID Problem List:  Cryptogenic cirrhosis with HCC s/p liver transplant 09/16/18  - Surgical complications: blood loss/hematoma  - Serologies: CMV D-/R+, EBV D+/R+, Toxo D-/R-  - Induction: Basiliximab, methylpred  - Immunosuppression: tacrolimus, myfortic  - Prophylaxis: bactrim    Pertinent Exposure History  Donor Infections: recurrent pneumonias in past     Pertinent Co-morbidities  IDDM2     Drug Intolerances    Infection History  # Recurrent R pleural effusion with peripheral eosinophilia  - 11/3 [facility]   - 840 nuc cells, 36500 RBCs, 46% eos   - Cx NG    - exudative  - 11/13 [facility]    - 1160 nuc cells, 66780 RBCs, 7% eos, 33% neut, 23% lymph, 31% mono   - exudative   - Cx NGTD, 1+ PMN, no org - intermittent mild peripheral eos (0.6-0.8) since transplant, no eos prior to 2019 per careeverywhere   - RF normal <8.6    Recommendations:  Tx:  - empiric tx with ivermectin 248mcg/kg daily x2 days then repeat in 2 weeks     Dx:   -please add afb and fungal cultures to pleural effusion cultures  - CT chest noncon  - obtain strongy Ab, ABPA panel, fungal Ab, HIV, urine histo Ag, fungitell, quant gold, ANA, G6PD level  -f/up ANCA  - stool O and P; at next thoracentesis please check pleural fluid O and P  - consider rheum and benign heme consult  - consider changing bactrim to dapsone (pending G6PD) to see if eos improve (drug-induced eosinophilia)  - if workup remains negative and patient continues to have recurrent hemorrhagic pleural effusions with negative cytology with or without eosinophils, I would be concerned about malignancy and would want to pursue pleural biopsy (which will also be gold standard for infectious diagnosis as well)  -please place referral for outpatient transplant ID clinic since patient is going to be discharged soon.    The [facility] ID service will sign off.  Please page the ID Transplant/Liquid Oncology Fellow consult at [phone] with questions.  Patient discussed with Dr. Kari Baars.    Maryjean Morn, MD  Infectious Disease Fellow, PGY-4    _______________________________________________________________________    Attending attestation  I saw and evaluated the patient. I agree with the findings and the plan of care as documented in the fellow???s note.    Jori Moll, MD  Immunocompromised ID  Pager [phone]  _______________________________________________________________________      History of Present Illness:      Source of information includes:  Electronic Medical Records.  History obtained from:patient. Patient is a 58yo male with PMHx including autoimmune cholangitis, cryptogenic cirrhosis and HCC s/p OLT on 09/16/18, IDDM2, severe AS and asthma who presented on 11/12 for SOB found to have recurrent pleural effusion.     Of note, patient has had numerous admissions since transplantation. Transplant itself was c/b peri-op blood loss requiring transfusions with intra-abdominal hematoma and leukocytosis. He was discharged with 7d cipro. He was then admitted 8/19-8/20 with fevers, SOB and fatigue with questionable pneumonia. He was discharged on a 7 day course of levaquin. Noted to have persistent ascites and volume overload in office notes. He was briefly admitted 10/6 for chest pain thought to be related to abdominal fluid collections, which were aspirated by VIR on 10/8 and NG. On 11/2 CTA chest noted development of a moderate R pleural effusion, small L pleural effusion and moderate pericardial effusion. He was admitted briefly 11/2-11/4 with thoracentesis by IP on 11/3 with 1L fluid drained. Cx NG. CXR with minimal effusion. CXR on 11/12 however showed reaccumulaton of the effusion so decision made to admit the patient for further tx/work-up.  TTE 11/13 without pericardial effusion, normal EF, and likely bicuspid AV.     Patient states he is boring and has no significant exposures. No international travel, no travel to the [place]. Has been to [place]. No crayfish or unusual food consumption. No hunting or gardening. No known TB exposures/prison/jail/homelessness.    Afebrile. WBC 15.5 on admission - ANC 11.5, eos 0.6 (0.5 on 11/10)  Intermittent mild eosinophilia post-transplant - less than 1  No eos 2019-2018  Elevated anti-mitochondrial M2 Ab (26.7, uln 20)  Quant gold neg 12/2017      11/3 [facility]  - 840 nuc cells, 36500 RBCs, 46% eos  - Cx NG   - exudative    Patient had thoracentesis on 11/13 with   - 1160 nuc cells, 66780 RBCs, 7% eos, 33% neut, 23% lymph, 31% mono  - exudative  - Cx NGTD, 1+ PMN, no org RF normal <8.6    Allergies:  Allergies   Allergen Reactions   ??? Watermelon Flavor      Mouth itching       Medications:   Current antibiotics:  Bactrim ppx    Previous antibiotics:  None recently        Current/Prior immunomodulators:  Tac, myfortic    Other medications reviewed.     Medical History:  Past Medical History:   Diagnosis Date   ??? Aortic valve stenosis    ??? Autoimmune cholangitis    ??? 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 Cholestatic cirrhosis (CMS-HCC)    ??? Hepatic encephalopathy (CMS-HCC)    ??? Hypertension    ??? Sleep apnea    ??? Type 2 diabetes mellitus (CMS-HCC)        Surgical History:  Past Surgical History:   Procedure Laterality Date   ??? APPENDECTOMY     ??? CHG US GUIDE, TISSUE ABLATION N/A 05/03/2018    Procedure: ULTRASOUND GUIDANCE FOR, AND MONITORING OF, PARENCHYMAL TISSUE ABLATION;  Surgeon: Particia Nearing, MD;  Location: MAIN OR [facility];  Service: Transplant   ??? PR CATH PLACE/CORON ANGIO, IMG SUPER/INTERP,R&L HRT CATH, L HRT VENTRIC N/A 03/21/2018    Procedure: Left/Right Heart Catheterization;  Surgeon: Neal Dy, MD;  Location: [facility] CATH;  Service: Cardiology   ??? PR EGD FLEXIBLE FOREIGN BODY REMOVAL N/A 12/26/2018    Procedure: UGI ENDOSCOPY; W/REMOVAL FOREIGN BODY;  Surgeon: Vonda Antigua, MD;  [facility];  Service: Gastroenterology   ??? PR LAP,ABLAT 1+ LIVER TUMOR(S),RADIOFREQ N/A 05/03/2018    Procedure: LAPAROSCOPY, SURGICAL, ABLATION OF 1 OR MORE LIVER TUMOR(S); RADIOFREQUENCY;  Surgeon: Particia Nearing, MD;  Location: MAIN OR [facility];  Service: Transplant   ??? PR THORACENTESIS NEEDLE/CATH PLEURA W/IMAGING N/A 01/06/2019    Procedure: THORACENTESIS W/ IMAGING;  Surgeon: Cleora Fleet, MD;  Location: BRONCH PROCEDURE LAB [facility];  Service: Pulmonary   ??? PR TRANSPLANT LIVER,ALLOTRANSPLANT Bilateral 09/16/2018 Procedure: LIVER ALLOTRANSPLANTATION; ORTHOTOPIC, PARTIAL OR WHOLE, FROM CADAVER OR LIVING DONOR, ANY AGE;  Surgeon: Florene Glen, MD;  Location: MA[facility] OR [facility];  Service: Transplant   ??? PR TRANSPLANT,PREP DONOR LIVER, WHOLE N/A 09/16/2018    Procedure: BACKBNCH STD PREP CAD DONOR WHOLE LIVER GFT PRIOR TNSPLNT,INC CHOLE,DISS/REM SURR TISSU WO TRISEG/LOBE SPLT;  Surgeon: Florene Glen, MD;  Location: MAIN OR [facility];  Service: Transplant       Social History:  Tobacco use:   reports that he has never smoked. He has never used smokeless tobacco.   Alcohol use:    reports previous alcohol use.   Drug use:    reports no history of drug use.   Living situation:  Lives alone   Residence:   country   Birth place  [place]   [place] travel:   [place] (remote), [place], [place] - no travel to [place] or [employer] travel:   Special educational needs teacher service:  None   Employment:  Employed as Acupuncturist exposure:  3 little Designer, fashion/clothing exposure:  none   Hobbies:  Fixes up cars/hot rods   TB exposures:  None - no h/o homelessness or incarceration, no contact w TB   Sexual history:     Other significant exposures:  No crayfish consumption     Family History:  no recent sick contacts in family and no history active TB in a family member  Family History   Problem Relation Age of Onset   ??? Asthma Mother    ??? COPD Father    ??? Cancer Father    ??? Autoimmune disease Sister         AIH   ??? Heart disease Sister    ??? Colon cancer Maternal Grandfather        Review of Systems:  All other systems reviewed are negative.          Vital Signs last 24 hours:  Temp:  [36.5 ??C-37 ??C] 36.8 ??C  Heart Rate:  [78-94] 82  Resp:  [18] 18  BP: (102-140)/(75-92) 116/87  MAP (mmHg):  [82-104] 96  FiO2 (%):  [32 %] 32 %  SpO2:  [96 %-98 %] 96 %    Physical Exam:  Patient Lines/Drains/Airways Status    Active Active Lines, Drains, & Airways     Name:   Placement date:   Placement time:   Site:   Days: Peripheral IV 01/05/19 Anterior;Proximal;Right Forearm   01/05/19    1815    Forearm   1              GEN:  looks well, no apparent distress  EYES: sclerae anicteric and non injected and PERRL, EOMI  ZOX:WRUEAVWUJ good and no thrush, leukoplakia or  oral lesions  CV:3/6 systolic murmur heard throughout, RRR and no peripheral edema  PULM:clear on left, diminished/no BS on R 2/3 up lung field and normal work of breathing at rest  ZO:XWRUEAVWU and soft, NT  JW:JXBJYNWG  RECTAL:deferred  SKIN:no petechiae, ecchymoses or obvious rashes on clothed exam  MSK:no swollen joints and full neck ROM  NEURO:no tremor noted, facial expression symmetric and moves extremities equally  PSYCH:attentive, appropriate affect, good eye contact, fluent speech    Labs:    Lab Results   Component Value Date    WBC 11.7 (H) 01/07/2019    WBC 11.3 (H) 01/06/2019    WBC 12.2 (H) 01/03/2019    WBC 10.0 12/15/2018    HGB 11.9 (L) 01/07/2019    HGB 11.8 (L) 01/03/2019    Hemoglobin 7.1 (L) 09/17/2018    HCT 38.2 (L) 01/07/2019    HCT 35.5 (L) 01/03/2019    Platelet 456 (H) 01/07/2019    Platelet 381 01/03/2019    Absolute Neutrophils 11.5 (H) 01/05/2019    Absolute Neutrophils 8.2 (H) 01/03/2019    Absolute Lymphocytes 1.7 01/05/2019    Absolute Lymphocytes 1.6 01/03/2019    Absolute Eosinophils 0.6 (H) 01/05/2019    Absolute Eosinophils 0.5 (H) 01/03/2019    Sodium 136 01/07/2019    Sodium 139 01/03/2019    Sodium Whole Blood 134 (L) 09/17/2018    Potassium 4.9 01/07/2019    Potassium 4.6 01/03/2019    Potassium, Bld 4.6 09/17/2018    BUN 22 (H) 01/07/2019    BUN 23 01/03/2019    Creatinine 1.13 01/07/2019    Creatinine 1.08 01/06/2019    Creatinine 1.33 (H) 01/03/2019    Creatinine 0.98 12/15/2018    Glucose 115 01/07/2019    Magnesium 1.8 01/07/2019    Magnesium 1.8 01/03/2019    Albumin 3.8 01/05/2019    Total Bilirubin 0.4 01/05/2019    Total Bilirubin 0.3 01/03/2019    AST 20 01/05/2019    AST 10 01/03/2019    ALT 14 01/05/2019 ALT 14 01/03/2019    Alkaline Phosphatase 103 01/05/2019    Alkaline Phosphatase 114 01/03/2019    INR 1.19 10/12/2018    INR 1.7 (H) 06/20/2018       Microbiology:  Past cultures were reviewed in Epic and CareEverywhere.     Review of cultures with microbiology: I discussed the microbiology with Dr. Kari Baars.    Imaging:  11/13 CXR   Slight interval decrease a moderate right pleural effusion.  Small left pleural effusion, unchanged.    11/13 TTE     1. Limited study to assess pericardial effusion.    2. No pericardial effusion.  Prominent pericardial fat pad - unchanged from  prior study in 09/17/18.    3. Normal left ventricular systolic function, ejection fraction 55-60%.    4. Degenerative mitral valve disease - mildly thickened.    5. Dilated left atrium - mildly dilated.    6. The aortic valve is probably bicuspid (congenitally malformed) with  moderately thickened leaflets with reduced excursion.    7. Normal right ventricular size and systolic function.    Independent visualization of images: I independently reviewed the image from (11/13) and I agree with the findings/interpretation.      Serologies:  Lab Results   Component Value Date    CMV IGG Positive (A) 09/15/2018    EBV VCA IgG Antibody Positive (A) 09/15/2018    Hep A IgG Nonreactive 01/04/2018    Hep B Surface Ag  Nonreactive 10/17/2018    Hep B S Ab Nonreactive 09/15/2018    Hep B Surf Ab Quant <8.00 09/15/2018    Hepatitis C Ab Nonreactive 09/15/2018    RPR Nonreactive 09/15/2018    HSV 1 IgG Negative 09/15/2018    HSV 2 IgG Negative 09/15/2018    Varicella IgG Positive 09/15/2018    Rubella IgG Scr Positive (A) 01/04/2018    Toxoplasma Gondii IgG Negative 09/15/2018    Quantiferon TB Gold Plus Interpretation Negative 01/04/2018    Quantiferon Mitogen Minus Nil >10.00 01/04/2018    Quantiferon Antigen 1 minus Nil -0.01 01/04/2018       Immunizations:  Immunization History   Administered Date(s) Administered ??? Influenza Virus Vaccine, unspecified formulation 11/23/2018

## 2019-01-08 NOTE — Unmapped (Signed)
Pt remains stable, sats WNL on r/a. He denies pain, but aware that pain meds are available.CT chest ordered and scheduled for this afternoon. Pt is ambulatory and independent of all ADLs. POC updated and discussed with pt. He had no concerns.    Problem: Adult Inpatient Plan of Care  Goal: Plan of Care Review  01/08/2019 1421 by Kallie Edward, RN  Outcome: Progressing  01/08/2019 1417 by Kallie Edward, RN  Outcome: Progressing  Goal: Patient-Specific Goal (Individualization)  01/08/2019 1421 by Kallie Edward, RN  Outcome: Progressing  01/08/2019 1417 by Kallie Edward, RN  Outcome: Progressing  Goal: Absence of Hospital-Acquired Illness or Injury  01/08/2019 1421 by Kallie Edward, RN  Outcome: Progressing  01/08/2019 1417 by Kallie Edward, RN  Outcome: Progressing  Goal: Optimal Comfort and Wellbeing  01/08/2019 1421 by Kallie Edward, RN  Outcome: Progressing  01/08/2019 1417 by Kallie Edward, RN  Outcome: Progressing  Goal: Readiness for Transition of Care  01/08/2019 1421 by Kallie Edward, RN  Outcome: Progressing  01/08/2019 1417 by Kallie Edward, RN  Outcome: Progressing  Goal: Rounds/Family Conference  01/08/2019 1421 by Kallie Edward, RN  Outcome: Progressing  01/08/2019 1417 by Kallie Edward, RN  Outcome: Progressing     Problem: Pain Acute  Goal: Optimal Pain Control  01/08/2019 1421 by Kallie Edward, RN  Outcome: Progressing  01/08/2019 1417 by Kallie Edward, RN  Outcome: Progressing     Problem: Noninvasive Ventilation Acute  Goal: Effective Unassisted Ventilation and Oxygenation  01/08/2019 1421 by Kallie Edward, RN  Outcome: Progressing  01/08/2019 1417 by Kallie Edward, RN  Outcome: Progressing     Problem: Obstructive Sleep Apnea Risk or Actual (Comorbidity Management)  Goal: Unobstructed Breathing During Sleep  01/08/2019 1421 by Kallie Edward, RN  Outcome: Progressing  01/08/2019 1417 by Kallie Edward, RN  Outcome: Progressing Problem: Obstructive Sleep Apnea Risk or Actual (Comorbidity Management)  Goal: Unobstructed Breathing During Sleep  01/08/2019 1421 by Kallie Edward, RN  Outcome: Progressing  01/08/2019 1417 by Kallie Edward, RN  Outcome: Progressing

## 2019-01-09 DIAGNOSIS — Z79899 Other long term (current) drug therapy: Principal | ICD-10-CM

## 2019-01-09 DIAGNOSIS — E612 Magnesium deficiency: Principal | ICD-10-CM

## 2019-01-09 DIAGNOSIS — Z944 Liver transplant status: Principal | ICD-10-CM

## 2019-01-09 LAB — BASIC METABOLIC PANEL
ANION GAP: 10 mmol/L (ref 7–15)
BLOOD UREA NITROGEN: 19 mg/dL (ref 7–21)
CALCIUM: 9.3 mg/dL (ref 8.5–10.2)
CHLORIDE: 99 mmol/L (ref 98–107)
CO2: 28 mmol/L (ref 22.0–30.0)
CREATININE: 1.06 mg/dL (ref 0.70–1.30)
EGFR CKD-EPI AA MALE: 90 mL/min/{1.73_m2} (ref >=60)
EGFR CKD-EPI NON-AA MALE: 78 mL/min/{1.73_m2} (ref >=60)
GLUCOSE RANDOM: 98 mg/dL (ref 70–179)
POTASSIUM: 4.9 mmol/L (ref 3.5–5.0)
SODIUM: 137 mmol/L (ref 135–145)

## 2019-01-09 LAB — MPO-QUANT: Lab: 2.2

## 2019-01-09 LAB — ANTI-NEUTROPHILIC CYTOPLASMIC ANTIBODY
ANCA IFA: POSITIVE — AB
MPO-ELISA: NEGATIVE
MPO-QUANT: 2.2 U/mL (ref <21.0)
PR3 ELISA: NEGATIVE

## 2019-01-09 LAB — GLUCOSE 6 PHOSPHATE DEHYDROGENASE: GLUCOSE-6-PHOSPHATE DEHYDROGENASE QUAL: 10.1 U/g/dL{HGB} (ref 5.3–10.3)

## 2019-01-09 LAB — CBC
HEMATOCRIT: 38.6 % — ABNORMAL LOW (ref 41.0–53.0)
HEMOGLOBIN: 12.1 g/dL — ABNORMAL LOW (ref 13.5–17.5)
MEAN CORPUSCULAR HEMOGLOBIN: 25.8 pg — ABNORMAL LOW (ref 26.0–34.0)
MEAN CORPUSCULAR VOLUME: 82.3 fL (ref 80.0–100.0)
MEAN PLATELET VOLUME: 8.5 fL (ref 7.0–10.0)
RED BLOOD CELL COUNT: 4.69 10*12/L (ref 4.50–5.90)
RED CELL DISTRIBUTION WIDTH: 14 % (ref 12.0–15.0)
WBC ADJUSTED: 11.2 10*9/L — ABNORMAL HIGH (ref 4.5–11.0)

## 2019-01-09 LAB — MAGNESIUM
MAGNESIUM: 1.8 mg/dL (ref 1.6–2.2)
Magnesium:MCnc:Pt:Ser/Plas:Qn:: 1.8

## 2019-01-09 LAB — HIV ANTIGEN/ANTIBODY COMBO: HIV 1+2 Ab+HIV1 p24 Ag:PrThr:Pt:Ser/Plas:Ord:IA: NONREACTIVE

## 2019-01-09 LAB — GLUCOSE-6-PHOSPHATE DEHYDROGENASE QUAL: Lab: 10.1

## 2019-01-09 LAB — RED CELL DISTRIBUTION WIDTH: Lab: 14

## 2019-01-09 LAB — PHOSPHORUS: Phosphate:MCnc:Pt:Ser/Plas:Qn:: 4.3

## 2019-01-09 LAB — CHLORIDE: Chloride:SCnc:Pt:Ser/Plas:Qn:: 99

## 2019-01-09 LAB — TACROLIMUS BLOOD: Lab: 9.4

## 2019-01-09 NOTE — Unmapped (Signed)
SRF TEACHING ROUNDS    An interdisciplinary care conference was held today and included the following team members: Katherine Roan, MD Transplant Surgeon , SRF Surgery Resident , Nunzio Cobbs, MD Transplant Nephrology Fellow , 5W RN, Cranston Neighbor, RN Transplant Nurse Coordinator, Salem Senate, RN, MSN Transplant Nurse Coordinator, Carole Binning, LCSWA Transplant Case Manager and Odessa Fleming, Pharm D Pharmacist (covering)    Per team, re-consult IR as CT shows fluid again, pt aware that he may be discharged home with a drain     Discharge Plan: Home with [facility] vs no needs; anticipated discharge on 1118.      Carole Binning, LCSWA  Transplant Case Manager  [facility]

## 2019-01-09 NOTE — Unmapped (Signed)
Problem: Noninvasive Ventilation Acute  Goal: Effective Unassisted Ventilation and Oxygenation  Outcome: Ongoing - Unchanged  Intervention: Monitor and Manage Noninvasive Ventilation  Flowsheets (Taken 01/09/2019 0453)  NPPV/CPAP Maintenance:   mask secure   tubes secured   Patient continues to use CPAP, tolerating well.

## 2019-01-09 NOTE — Unmapped (Signed)
Met with patient after rounds-he denies any concerns at present, will follow  Caryl Ada Inpatient Transplant Nurse Coordinator 01/09/2019 10:16 AM

## 2019-01-09 NOTE — Unmapped (Signed)
Pt A&Ox4. VSS. No ASD noted. Dressing to R flank/back is clean, dry, and intact. No c/o of dyspnea or CP. Administered medications as ordered. PRN Oxycodone given with benefit. CPAP in place while sleeping. Pt to have CT done later this shift. Voiding using urinal at bedside. Up ad lib to toilet for bowel movement. Bed in low, call bell within reach. WCTM.     Problem: Adult Inpatient Plan of Care  Goal: Plan of Care Review  Outcome: Progressing  Goal: Patient-Specific Goal (Individualization)  Outcome: Progressing  Goal: Absence of Hospital-Acquired Illness or Injury  Outcome: Progressing  Goal: Optimal Comfort and Wellbeing  Outcome: Progressing  Goal: Readiness for Transition of Care  Outcome: Progressing  Goal: Rounds/Family Conference  Outcome: Progressing     Problem: Pain Acute  Goal: Optimal Pain Control  Outcome: Progressing     Problem: Noninvasive Ventilation Acute  Goal: Effective Unassisted Ventilation and Oxygenation  Outcome: Progressing     Problem: Obstructive Sleep Apnea Risk or Actual (Comorbidity Management)  Goal: Unobstructed Breathing During Sleep  Outcome: Progressing

## 2019-01-09 NOTE — Unmapped (Signed)
Care Management  Initial Transition Planning Assessment              General  Orientation Level: Oriented X4    Contact/Decision Maker  Extended Emergency Contact Information  Primary Emergency Contact: Donnie Coffin  Address: [address]           [place], [place] [address] [place]  Home Phone: [phone]  Mobile Phone: [phone]  Relation: Spouse  Interpreter needed? No  Secondary Emergency Contact: Leonidas Romberg  Mobile Phone: [phone]  Relation: Daughter    Legal Next of Kin / Guardian / POA / Advance Directives     HCDM (patient stated preference): Donnie Coffin - Spouse - [phone]    Advance Directive (Medical Treatment)  Does patient have an advance directive covering medical treatment?: Patient has advance directive covering medical treatment, copy not in chart.  Reason patient does not have an advance directive covering medical treatment:: Patient does not wish to complete one at this time.    Health Care Decision Maker [HCDM] (Medical & Mental Health Treatment)  Healthcare Decision Maker: HCDM documented in the HCDM/Contact Info section.  Information offered on HCDM, Medical & Mental Health advance directives:: Patient given information.    Advance Directive (Mental Health Treatment)  Does patient have an advance directive covering mental health treatment?: Patient does not have advance directive covering mental health treatment.    Patient Information  Lives with: Alone    Type of Residence: Private residence             Support Systems/Concerns: Case Manager/Social Worker, Children, Spouse, Friends/Neighbors, Family Members         Home Care services in place prior to admission?: No                                  Financial Information               Social Determinants of Health  Social Determinants of Health were addressed in provider documentation.  Please refer to patient history.    Discharge Needs Assessment  Concerns to be Addressed: Clinical Risk Factors:           Prior overnight hospital stay or ED visit in last 90 days: Yes                        Discharge Facility/Level of Care Needs:      Readmission  Risk of Unplanned Readmission Score: UNPLANNED READMISSION SCORE: 24%  Predictive Model Details           24% (High) Factors Contributing to Score   Calculated 01/08/2019 16:03 22% Number of active Rx orders is 37   Meade Risk of Unplanned Readmission Model 19% Number of ED visits in last six months is 4     13% Number of hospitalizations in last year is 3     8% ECG/EKG order is present in last 6 months     6% Restraint order is present in last 6 months     6% Imaging order is present in last 6 months     5% Latest hemoglobin is low (11.1 g/dL)     5% Phosphorous result is present     5% Charlson Comorbidity Index is 5     4% Age is 58     4% Active anticoagulant Rx order is present  2% Current length of stay is 2.954 days     2% Future appointment is scheduled     Readmitted Within the Last 30 Days? (No if blank) Yes       Discharge Plan       Expected Discharge Date: 01/07/2019    Expected Transfer from Critical Care:

## 2019-01-09 NOTE — Unmapped (Signed)
VSS, afebrile throughout shift. No acute events to report, no PRNs requested, no nausea, discomfort or emesis. Scheduled medications administered. Good PO intake & output. Will continue plan of care. Rested comfortably throughout shift. Patient safety maintained.   Problem: Adult Inpatient Plan of Care  Goal: Plan of Care Review  Outcome: Progressing  Goal: Patient-Specific Goal (Individualization)  Outcome: Progressing  Goal: Absence of Hospital-Acquired Illness or Injury  Outcome: Progressing  Goal: Optimal Comfort and Wellbeing  Outcome: Progressing  Goal: Readiness for Transition of Care  Outcome: Progressing  Goal: Rounds/Family Conference  Outcome: Progressing     Problem: Pain Acute  Goal: Optimal Pain Control  Outcome: Progressing     Problem: Noninvasive Ventilation Acute  Goal: Effective Unassisted Ventilation and Oxygenation  Outcome: Progressing

## 2019-01-09 NOTE — Unmapped (Signed)
Problem: Adult Inpatient Plan of Care  Goal: Plan of Care Review  01/08/2019 1647 by Kallie Edward, RN  Outcome: Progressing  01/08/2019 1421 by Kallie Edward, RN  Outcome: Progressing  01/08/2019 1417 by Kallie Edward, RN  Outcome: Progressing  Goal: Patient-Specific Goal (Individualization)  01/08/2019 1647 by Kallie Edward, RN  Outcome: Progressing  01/08/2019 1421 by Kallie Edward, RN  Outcome: Progressing  01/08/2019 1417 by Kallie Edward, RN  Outcome: Progressing  Goal: Absence of Hospital-Acquired Illness or Injury  01/08/2019 1647 by Kallie Edward, RN  Outcome: Progressing  01/08/2019 1421 by Kallie Edward, RN  Outcome: Progressing  01/08/2019 1417 by Kallie Edward, RN  Outcome: Progressing  Goal: Optimal Comfort and Wellbeing  01/08/2019 1647 by Kallie Edward, RN  Outcome: Progressing  01/08/2019 1421 by Kallie Edward, RN  Outcome: Progressing  01/08/2019 1417 by Kallie Edward, RN  Outcome: Progressing  Goal: Readiness for Transition of Care  01/08/2019 1647 by Kallie Edward, RN  Outcome: Progressing  01/08/2019 1421 by Kallie Edward, RN  Outcome: Progressing  01/08/2019 1417 by Kallie Edward, RN  Outcome: Progressing  Goal: Rounds/Family Conference  01/08/2019 1647 by Kallie Edward, RN  Outcome: Progressing  01/08/2019 1421 by Kallie Edward, RN  Outcome: Progressing  01/08/2019 1417 by Kallie Edward, RN  Outcome: Progressing

## 2019-01-10 DIAGNOSIS — E612 Magnesium deficiency: Principal | ICD-10-CM

## 2019-01-10 DIAGNOSIS — Z944 Liver transplant status: Principal | ICD-10-CM

## 2019-01-10 DIAGNOSIS — Z79899 Other long term (current) drug therapy: Principal | ICD-10-CM

## 2019-01-10 LAB — BLASTOMYCES AB
BLASTOMYCES AB: NEGATIVE
Blastomyces dermatitidis Ab:PrThr:Pt:Ser:Ord:: NEGATIVE

## 2019-01-10 LAB — BASIC METABOLIC PANEL
ANION GAP: 9 mmol/L (ref 7–15)
BLOOD UREA NITROGEN: 21 mg/dL (ref 7–21)
BUN / CREAT RATIO: 17
CHLORIDE: 101 mmol/L (ref 98–107)
CO2: 27 mmol/L (ref 22.0–30.0)
CREATININE: 1.26 mg/dL (ref 0.70–1.30)
EGFR CKD-EPI AA MALE: 73 mL/min/{1.73_m2} (ref >=60)
EGFR CKD-EPI NON-AA MALE: 63 mL/min/{1.73_m2} (ref >=60)
GLUCOSE RANDOM: 101 mg/dL (ref 70–179)
POTASSIUM: 4.9 mmol/L (ref 3.5–5.0)
SODIUM: 137 mmol/L (ref 135–145)

## 2019-01-10 LAB — EGFR CKD-EPI NON-AA MALE: Lab: 63

## 2019-01-10 LAB — CBC
HEMATOCRIT: 36.2 % — ABNORMAL LOW (ref 41.0–53.0)
HEMOGLOBIN: 11.5 g/dL — ABNORMAL LOW (ref 13.5–17.5)
MEAN CORPUSCULAR HEMOGLOBIN CONC: 31.8 g/dL (ref 31.0–37.0)
MEAN CORPUSCULAR HEMOGLOBIN: 26 pg (ref 26.0–34.0)
MEAN CORPUSCULAR VOLUME: 81.8 fL (ref 80.0–100.0)
MEAN PLATELET VOLUME: 8.3 fL (ref 7.0–10.0)
RED BLOOD CELL COUNT: 4.43 10*12/L — ABNORMAL LOW (ref 4.50–5.90)
RED CELL DISTRIBUTION WIDTH: 13.9 % (ref 12.0–15.0)

## 2019-01-10 LAB — TACROLIMUS BLOOD: Lab: 7.1

## 2019-01-10 LAB — PHOSPHORUS: Phosphate:MCnc:Pt:Ser/Plas:Qn:: 4.4

## 2019-01-10 LAB — FUNGITELL ASSAY: FUNGITELL QUALITATIVE: NEGATIVE

## 2019-01-10 LAB — MEAN PLATELET VOLUME: Lab: 8.3

## 2019-01-10 LAB — MAGNESIUM
MAGNESIUM: 1.7 mg/dL (ref 1.6–2.2)
Magnesium:MCnc:Pt:Ser/Plas:Qn:: 1.7

## 2019-01-10 LAB — HISTOPLASMA AG, URINE VALUE: Histoplasma capsulatum Ag:ACnc:Pt:Urine:Qn:IA: 0

## 2019-01-10 LAB — ANA PATTERN 1

## 2019-01-10 LAB — FUNGITELL QUALITATIVE: Lab: NEGATIVE

## 2019-01-10 LAB — HISTOPLASMA ANTIGEN, URINE: HISTOPLASMA AG, URINE RESULT: NEGATIVE

## 2019-01-10 NOTE — Unmapped (Signed)
Transplant Surgery Progress Note    Hospital Day: 5    Assessment & Plan:   Patient is a 58 y.o. male with cryptogenic cirrhosis and hepatocellular carcinoma s/p OLT 09/16/2018, aortic valve stenosis, type 2 diabetes, autoimmune cholangitis with recent drainage of intra-abdominal fluid collection by VIR, admitted recently 11/2-11/4 for thoracentesis with Interventional Pulmonology on 11/3. CXR from this morning shows recurrance of right sided pleural effusion now status post thoracentesis on 11/13.  ??  - Regular diet, ML  **Pleaural Effusion   - Thoracentesis with IP 11/13: F/u fluid cytology/cultures/fluid analysis   - CT chest 11/16: Interval worsening of large right pleural effusion with right middle and lower lobe collapse   - Plan for chest tube placement with IP  - Appreciate ICID recommendations. Additional labs ordered. Ivermectin given.  - Resume home medication including immunosuppression and home lasix  - OOB, IS  - Home CPAP  - SQH  ??   If you have any questions, concerns or changes in the patient's clinical status, please page the [facility] floor pager. [phone]    Interval Events:     NAEON. Afebrile, VSS. CT chest done this morning that demonstrated interval worsening of large right pleural effusion with right middle and lower lobe collapse. Patient feels that symptoms are stable.    Objective:        Vital Signs:  BP 114/84  - Pulse 85  - Temp 36.6 ??C (Oral)  - Resp 16  - SpO2 95%     Input/Output:  I/O last 3 completed shifts:  In: 780 [P.O.:780]  Out: 3030 [Urine:3030]    Physical Exam:  General: Cooperative, no distress, well appearing   Pulmonary: Normal work of breathing, equal bilateral chest rise.  Cardiac: Regular rate, HDS  Abdomen: Soft, non-distended, non-tender. Well healed mercedes incision from   Skin: No jaundice, rashes, erythema, or lesions. Skin color and texture normal.  Neurologic: Alert and interactive, no motor abnormalities noted.       Labs:  Reviewed    Imaging: All pertinent imaging personally reviewed.

## 2019-01-10 NOTE — Unmapped (Addendum)
Pt resting in bed, eyes closed. A&Ox4. VSS. No ASD noted. Dressing to R flank is clean, dry, and intact. Requested PRN Oxycodone, administered x1 with benefit. Tolerated meal and po fluids, voiding adequately. CPAP on while sleeping. NPO at MN for procedure later today. Plan to have chest tube placed. Bed in low, call bell within reach. WCTM.     Problem: Adult Inpatient Plan of Care  Goal: Plan of Care Review  Outcome: Progressing  Goal: Patient-Specific Goal (Individualization)  Outcome: Progressing  Goal: Absence of Hospital-Acquired Illness or Injury  Outcome: Progressing  Goal: Optimal Comfort and Wellbeing  Outcome: Progressing  Goal: Readiness for Transition of Care  Outcome: Progressing  Goal: Rounds/Family Conference  Outcome: Progressing     Problem: Pain Acute  Goal: Optimal Pain Control  Outcome: Progressing     Problem: Noninvasive Ventilation Acute  Goal: Effective Unassisted Ventilation and Oxygenation  Outcome: Progressing     Problem: Obstructive Sleep Apnea Risk or Actual (Comorbidity Management)  Goal: Unobstructed Breathing During Sleep  Outcome: Progressing

## 2019-01-10 NOTE — Unmapped (Signed)
For chest tube placement tomorrow, please send pleural fluid for   1.bacterial cultures  2.AFB cultures and TB-PCR,  3. fungal cultures,   4. cytology   5.AND ova and parasites.     Jori Moll, MD MPH  Immunocompromised Infectious Diseases  Pager [phone]

## 2019-01-11 LAB — TACROLIMUS BLOOD: Lab: 5.7

## 2019-01-11 LAB — GLUCOSE, BODY FLUID: GLUCOSE FLUID: 119 mg/dL

## 2019-01-11 LAB — BASIC METABOLIC PANEL
ANION GAP: 10 mmol/L (ref 7–15)
BLOOD UREA NITROGEN: 23 mg/dL — ABNORMAL HIGH (ref 7–21)
BUN / CREAT RATIO: 20
CALCIUM: 9.2 mg/dL (ref 8.5–10.2)
CHLORIDE: 103 mmol/L (ref 98–107)
CO2: 26 mmol/L (ref 22.0–30.0)
CREATININE: 1.17 mg/dL (ref 0.70–1.30)
EGFR CKD-EPI AA MALE: 80 mL/min/{1.73_m2} (ref >=60)
EGFR CKD-EPI NON-AA MALE: 69 mL/min/{1.73_m2} (ref >=60)
GLUCOSE RANDOM: 110 mg/dL (ref 70–179)
POTASSIUM: 5.5 mmol/L — ABNORMAL HIGH (ref 3.5–5.0)
SODIUM: 139 mmol/L (ref 135–145)

## 2019-01-11 LAB — CBC
HEMATOCRIT: 39.7 % — ABNORMAL LOW (ref 41.0–53.0)
MEAN CORPUSCULAR HEMOGLOBIN CONC: 31.5 g/dL (ref 31.0–37.0)
MEAN CORPUSCULAR VOLUME: 81 fL (ref 80.0–100.0)
MEAN PLATELET VOLUME: 8 fL (ref 7.0–10.0)
PLATELET COUNT: 479 10*9/L — ABNORMAL HIGH (ref 150–440)
RED BLOOD CELL COUNT: 4.9 10*12/L (ref 4.50–5.90)
RED CELL DISTRIBUTION WIDTH: 14.1 % (ref 12.0–15.0)

## 2019-01-11 LAB — PHOSPHORUS: Phosphate:MCnc:Pt:Ser/Plas:Qn:: 4.9 — ABNORMAL HIGH

## 2019-01-11 LAB — ADDON DIFFERENTIAL ONLY
BASOPHILS ABSOLUTE COUNT: 0.1 10*9/L (ref 0.0–0.1)
EOSINOPHILS RELATIVE PERCENT: 4.6 %
LYMPHOCYTES ABSOLUTE COUNT: 1.5 10*9/L (ref 1.5–5.0)
LYMPHOCYTES RELATIVE PERCENT: 11.8 %
MONOCYTES ABSOLUTE COUNT: 1.2 10*9/L — ABNORMAL HIGH (ref 0.2–0.8)
MONOCYTES RELATIVE PERCENT: 9.6 %
NEUTROPHILS ABSOLUTE COUNT: 9.1 10*9/L — ABNORMAL HIGH (ref 2.0–7.5)
NEUTROPHILS RELATIVE PERCENT: 71.9 %

## 2019-01-11 LAB — GLUCOSE FLUID: Lab: 119

## 2019-01-11 LAB — ASPR FUMIGAT IGG: Aspergillus fumigatus Ab.IgG:MCnc:Pt:Ser:Qn:: 10.4

## 2019-01-11 LAB — LYMPHOCYTES ABSOLUTE COUNT: Lymphocytes:NCnc:Pt:Bld:Qn:Automated count: 1.5

## 2019-01-11 LAB — MAGNESIUM: Magnesium:MCnc:Pt:Ser/Plas:Qn:: 1.8

## 2019-01-11 LAB — PLATELET COUNT: Platelets:NCnc:Pt:Bld:Qn:Automated count: 479 — ABNORMAL HIGH

## 2019-01-11 LAB — BLOOD UREA NITROGEN: Urea nitrogen:MCnc:Pt:Ser/Plas:Qn:: 23 — ABNORMAL HIGH

## 2019-01-11 NOTE — Unmapped (Signed)
Transplant Surgery Progress Note    Hospital Day: 6    Assessment & Plan:   Patient is a 58 y.o. male with cryptogenic cirrhosis and hepatocellular carcinoma s/p OLT 09/16/2018, aortic valve stenosis, type 2 diabetes, autoimmune cholangitis with recent drainage of intra-abdominal fluid collection by VIR, admitted recently 11/2-11/4 for thoracentesis with Interventional Pulmonology on 11/3. CXR from this morning shows recurrance of right sided pleural effusion now status post thoracentesis on 11/13.  ??  - Regular diet, ML  **Pleaural Effusion   - Thoracentesis with IP 11/13: F/u fluid cytology/cultures/fluid analysis   - CT chest 11/16: Interval worsening of large right pleural effusion with right middle and lower lobe collapse   - Plan for chest tube placement with IP 11/18  - Appreciate ICID recommendations. Additional labs ordered. Ivermectin given.  - Resume home medication including immunosuppression and home lasix  - OOB, IS  - Home CPAP  - SQH  ??   If you have any questions, concerns or changes in the patient's clinical status, please page the [facility] floor pager. [phone]    Interval Events:     NAEON. Afebrile, VSS. Patient saturating well on room air, plan for iPulm pigtail 11/18    Objective:        Vital Signs:  BP 129/91  - Pulse 82  - Temp 36.5 ??C (Oral)  - Resp 16  - SpO2 95%     Input/Output:  I/O last 3 completed shifts:  In: -   Out: 2825 [Urine:2825]    Physical Exam:  General: Cooperative, no distress, well appearing   Pulmonary: Normal work of breathing, equal bilateral chest rise.  Cardiac: Regular rate, HDS  Abdomen: Soft, non-distended, non-tender. Well healed mercedes incision from   Skin: No jaundice, rashes, erythema, or lesions. Skin color and texture normal.  Neurologic: Alert and interactive, no motor abnormalities noted.       Labs:  Reviewed    Imaging:  All pertinent imaging personally reviewed.

## 2019-01-11 NOTE — Unmapped (Signed)
Patient is resting in bed. No distress noted. VS within normal limits. Call bell within reach. Wife visited the patient today. Patient will be NPO 11/18 for procedure. Patient is being transferred to [address] [place], report given to RN that will be taking care of patient on [address] [place]. RN verbalized understanding. Belongings with patient.   Problem: Adult Inpatient Plan of Care  Goal: Plan of Care Review  Outcome: Progressing  Goal: Patient-Specific Goal (Individualization)  Outcome: Progressing  Goal: Absence of Hospital-Acquired Illness or Injury  Outcome: Progressing  Goal: Optimal Comfort and Wellbeing  Outcome: Progressing  Goal: Readiness for Transition of Care  Outcome: Progressing  Goal: Rounds/Family Conference  Outcome: Progressing     Problem: Pain Acute  Goal: Optimal Pain Control  Outcome: Progressing     Problem: Noninvasive Ventilation Acute  Goal: Effective Unassisted Ventilation and Oxygenation  Outcome: Progressing     Problem: Obstructive Sleep Apnea Risk or Actual (Comorbidity Management)  Goal: Unobstructed Breathing During Sleep  Outcome: Progressing

## 2019-01-11 NOTE — Unmapped (Signed)
Transplant Surgery Progress Note    Hospital Day: 7    Assessment & Plan:   Patient is a 58 y.o. male with cryptogenic cirrhosis and hepatocellular carcinoma s/p OLT 09/16/2018, aortic valve stenosis, type 2 diabetes, autoimmune cholangitis with recent drainage of intra-abdominal fluid collection by VIR, admitted recently 11/2-11/4 for thoracentesis with Interventional Pulmonology on 11/3. CXR from this morning shows recurrance of right sided pleural effusion now status post thoracentesis on 11/13.  ??  - Regular diet, ML  **Pleaural Effusion   - Thoracentesis with IP 11/13: F/u fluid cytology/cultures/fluid analysis   - CT chest 11/16: Interval worsening of large right pleural effusion with right middle and lower lobe collapse   - Plan for chest tube placement with IP 11/18   -post chest tube cxr  - Appreciate ICID recommendations. Additional labs ordered. Ivermectin given.  - Resume home medication including immunosuppression and home lasix  - OOB, IS  - Home CPAP  - SQH  ??   If you have any questions, concerns or changes in the patient's clinical status, please page the [facility] floor pager. [phone]    Interval Events:     NAEON. Afebrile, VSS. Patient saturating well on room air, plan for iPulm pigtail 11/18    Objective:        Vital Signs:  BP 109/65  - Pulse 83  - Temp 35.3 ??C (Axillary)  - Resp 18  - SpO2 97%     Input/Output:  I/O last 3 completed shifts:  In: -   Out: 2500 [Urine:2500]    Physical Exam:  General: Cooperative, no distress, well appearing   Pulmonary: Normal work of breathing, equal bilateral chest rise.  Cardiac: Regular rate, HDS  Abdomen: Soft, non-distended, non-tender. Well healed mercedes incision from   Skin: No jaundice, rashes, erythema, or lesions. Skin color and texture normal.  Neurologic: Alert and interactive, no motor abnormalities noted.       Labs:  Reviewed    Imaging:  All pertinent imaging personally reviewed.

## 2019-01-11 NOTE — Unmapped (Signed)
Brief Operative Note  (CSN: [id number])      Date of Surgery: 01/11/2019    Pre-op Diagnosis: Pleural Effusion    Post-op Diagnosis: Pleural effusion, right [J90]    Procedure(s):  PLEURAL DRAINAGE, PERC, W INSERTION OF INDWELLING CATHETER; W IMAGING GUIDANCE: 98119 (CPT??)  Note: Revisions to procedures should be made in chart - see Procedures activity.    Performing Service: Pulmonary  Surgeon(s) and Role:     * Jerelyn Charles, MD - Primary     * Cleora Fleet, MD - Fellow - Interventional    Assistant: None    Findings: Simple anechoic pocket of fluid identified within the right hemithorax. Right sided 14 French chest tube placed. Please see corresponding procedure note for further details     Anesthesia: Local (Surgeon Admin Only)    Estimated Blood Loss: None    Complications: None    Specimens: None collected    Implants: * No implants in log *    Surgeon Notes: I was present and scrubbed for the entire procedure    Cleora Fleet   Date: 01/11/2019  Time: 8:46 AM

## 2019-01-11 NOTE — Unmapped (Signed)
See procedure note.

## 2019-01-11 NOTE — Unmapped (Signed)
Pt has remained a/o/a, avss with resp even and unlabored at rest. Does endorse mild DOE. No c/o pain. No falls nor injuries noted. Pt is independent with ambulation and adls. Plan for pigtail CT placement today.       Problem: Adult Inpatient Plan of Care  Goal: Plan of Care Review  Outcome: Ongoing - Unchanged  Goal: Patient-Specific Goal (Individualization)  Outcome: Ongoing - Unchanged  Goal: Absence of Hospital-Acquired Illness or Injury  Outcome: Ongoing - Unchanged  Goal: Optimal Comfort and Wellbeing  Outcome: Ongoing - Unchanged  Goal: Readiness for Transition of Care  Outcome: Ongoing - Unchanged  Goal: Rounds/Family Conference  Outcome: Ongoing - Unchanged     Problem: Pain Acute  Goal: Optimal Pain Control  Outcome: Ongoing - Unchanged     Problem: Noninvasive Ventilation Acute  Goal: Effective Unassisted Ventilation and Oxygenation  Outcome: Ongoing - Unchanged

## 2019-01-11 NOTE — Unmapped (Signed)
INTERVENTIONAL PULMONOLOGY FOLLOW-UP CONSULT NOTE    Assessment:     [name] is a 58 year old Caucasian male with a past medical history of cryptogenic cirrhosis and HCC s/p orthotopic liver transplantation in July 2020 who is being seen for a recurrent right sided exudative pleural effusion. Initial thoracentesis on 11/3 with eosinophilic predominance. Additional, pleural fluid studies ordered which are pending. Specialized serum testing to further evaluate peripheral eosinophilia are pending as well       Plan:     1. S/p right sided chest tube placement. Please see corresponding procedure note for further details     2. Due to post-procedural chest tightness and shortness of breath, the chest tube was clamped. It should be unclamped in one hr and placed back on water seal      3. Pleural fluid studies ordered are pending. Reduction in eosinophil count with prior drainage     4. Please obtain daily radiographs at 0600 to verify chest tube placement     5. Flush chest tube with 20 ml of saline forwards and 20 ml of saline backwards BID    Please call the Interventional Pulmonary pager at [phone] with any questions.    Subjective:     S: Significant chest tightness, nausea and shortness of breath post-placement of chest tube     Objective:     Physical Examination:     BP 100/68  - Pulse 81  - Temp 35.3 ??C (95.5 ??F) (Axillary)  - Resp 20  - SpO2 96%    General appearance - alert, well appearing, and in no distress  Heart - normal rate, regular rhythm, normal S1, S2, no murmurs, rubs, clicks or gallops  Chest - Diminished breath sounds on the right  Abdomen - soft, nontender, nondistended, no masses or organomegaly  Extremities - No pedal edema, no clubbing or cyanosis    Labs and Imaging:  CXR (01/10/19): Unchanged moderate right pleural effusion     Cleora Fleet, MD  Interventional Pulmonology

## 2019-01-11 NOTE — Unmapped (Signed)
INDICATIONS: Recurrent right sided pleural effusion, previously eosinophilic     CONSENT AND TIMEOUT:   After the risks benefits and alternatives of the procedure were thoroughly explained, informed consent was obtained including the risks of chest pain, cough, bleeding, infection, and injury to the lung or orther organs. Immediately prior to the procedure, the time out was executed including correct patient identification and agreement on the procedure to be performed.    PROCEDURE DETAILS:   On ultrasound examination an anechoic pocket was identified within the right hemithorax. The skin was marked. The area was prepped with chlorhexadine and draped in the usual sterile fashion.  Following this 12 mL of 1% lidocaine as injected subcutaneously to provide topical anesthesia.  A small incision was then made parallel and superior to the rib, the finder needle was inserted into the chest wall and advanced under constant aspiration. Upon aspiration of serosanguineous pleural fluid, a guidewire was threaded and the needle removed. The tract was then dilated and the 14 F chest tube advanced to the pleural space. The chest tube was then connected to the Pleurvac, sutured in place, and a sterile dressing was applied. The patient tolerated the procedure the well. Upon placement on suction, he developed increasing chest tightness. It was transitioned to water seal, however despite that he remained with chest tightness and increasing shortness of breath, therefore the chest tube was clamped.       FINDINGS: Simple anechoic fluid identified in the right hemithorax. 1580 ml of serosanguineous fluid drained at the end of the procedure    SPECIMENS: Fluid studies sent for cytology, cell count, pleural fluid culture, albumin, cholesterol, protein, and LDH.       ESTIMATED BLOOD LOSS:  None    COMPLICATIONS: No Immediate Post-Procedure Complications Noted. PLAN: A follow up chest x-ray is ordered and is pending. Please keep the tube on water seal once the patient's pain subsides and flush twice daily with 20 ccs of sterile saline towards and away from the patient. Please obtain daily chest X-rays while the tube is in place.

## 2019-01-12 DIAGNOSIS — Z944 Liver transplant status: Principal | ICD-10-CM

## 2019-01-12 DIAGNOSIS — Z79899 Other long term (current) drug therapy: Principal | ICD-10-CM

## 2019-01-12 DIAGNOSIS — E612 Magnesium deficiency: Principal | ICD-10-CM

## 2019-01-12 LAB — BASIC METABOLIC PANEL
ANION GAP: 10 mmol/L (ref 7–15)
BLOOD UREA NITROGEN: 25 mg/dL — ABNORMAL HIGH (ref 7–21)
BUN / CREAT RATIO: 20
CALCIUM: 9.3 mg/dL (ref 8.5–10.2)
CHLORIDE: 101 mmol/L (ref 98–107)
CO2: 24 mmol/L (ref 22.0–30.0)
CREATININE: 1.28 mg/dL (ref 0.70–1.30)
EGFR CKD-EPI NON-AA MALE: 62 mL/min/{1.73_m2} (ref >=60)
GLUCOSE RANDOM: 115 mg/dL (ref 70–179)
POTASSIUM: 5 mmol/L (ref 3.5–5.0)
SODIUM: 135 mmol/L (ref 135–145)

## 2019-01-12 LAB — TOTAL IGE: Lab: 9.08

## 2019-01-12 LAB — CBC
HEMATOCRIT: 38.7 % — ABNORMAL LOW (ref 41.0–53.0)
HEMOGLOBIN: 12 g/dL — ABNORMAL LOW (ref 13.5–17.5)
MEAN CORPUSCULAR HEMOGLOBIN: 25.6 pg — ABNORMAL LOW (ref 26.0–34.0)
MEAN CORPUSCULAR VOLUME: 82.1 fL (ref 80.0–100.0)
MEAN PLATELET VOLUME: 7.3 fL (ref 7.0–10.0)
PLATELET COUNT: 446 10*9/L — ABNORMAL HIGH (ref 150–440)
RED CELL DISTRIBUTION WIDTH: 14 % (ref 12.0–15.0)
WBC ADJUSTED: 12.7 10*9/L — ABNORMAL HIGH (ref 4.5–11.0)

## 2019-01-12 LAB — RED BLOOD CELL COUNT: Lab: 4.71

## 2019-01-12 LAB — GLUCOSE RANDOM: Glucose:MCnc:Pt:Ser/Plas:Qn:: 115

## 2019-01-12 LAB — TB AG2 VALUE: Lab: 0.18

## 2019-01-12 LAB — MAGNESIUM: Magnesium:MCnc:Pt:Ser/Plas:Qn:: 1.7

## 2019-01-12 LAB — TB AG1 VALUE: Lab: 0.03

## 2019-01-12 LAB — QUANTIFERON ANTIGEN 1 MINUS NIL: Lab: -0.4

## 2019-01-12 LAB — TACROLIMUS BLOOD: Lab: 5.5

## 2019-01-12 LAB — QUANTIFERON TB GOLD PLUS
QUANTIFERON ANTIGEN 2 MINUS NIL: -0.25 [IU]/mL
QUANTIFERON MITOGEN: 1.67 [IU]/mL
QUANTIFERON TB NIL VALUE: 0.43 [IU]/mL

## 2019-01-12 LAB — CYCLIC CITRUL PEPTIDE ANTIBODY, IGG: CCP IGG ANTIBODIES: NEGATIVE

## 2019-01-12 LAB — TB MITOGEN VALUE: Lab: 2.1

## 2019-01-12 LAB — CCP IGG ANTIBODIES: Lab: NEGATIVE

## 2019-01-12 LAB — PHOSPHORUS: Phosphate:MCnc:Pt:Ser/Plas:Qn:: 4.2

## 2019-01-12 LAB — TB NIL VALUE: Lab: 0.43

## 2019-01-12 LAB — STRONGYLOIDES ANTIBODY: Strongyloides sp Ab.IgG:PrThr:Pt:Ser:Ord:IA: NEGATIVE

## 2019-01-12 NOTE — Unmapped (Signed)
Plan of care reviewed w/ pt at start of the shift. VSS. Stable on 2L [place]. Afebrile. Right pigtail chest tube to water seal- flushed w/ saline per order. PRN oxy for right flank pain. Tolerating regular diet w/ no complaints of nausea. Voiding. Pt had BM today. CPAP at night. No falls or injuries this shift. Will continue to monitor.       Problem: Adult Inpatient Plan of Care  Goal: Plan of Care Review  Outcome: Progressing  Goal: Patient-Specific Goal (Individualization)  Outcome: Progressing  Goal: Absence of Hospital-Acquired Illness or Injury  Outcome: Progressing  Goal: Optimal Comfort and Wellbeing  Outcome: Progressing  Goal: Readiness for Transition of Care  Outcome: Progressing  Goal: Rounds/Family Conference  Outcome: Progressing     Problem: Pain Acute  Goal: Optimal Pain Control  Outcome: Progressing     Problem: Noninvasive Ventilation Acute  Goal: Effective Unassisted Ventilation and Oxygenation  Outcome: Progressing     Problem: Obstructive Sleep Apnea Risk or Actual (Comorbidity Management)  Goal: Unobstructed Breathing During Sleep  Outcome: Progressing     Problem: Fall Injury Risk  Goal: Absence of Fall and Fall-Related Injury  Outcome: Progressing

## 2019-01-12 NOTE — Unmapped (Signed)
Thoracic Surgery Consult Note    Requesting Attending Physician :  Lilyan Punt [facility]*    Service Requesting Consult : Transplant Surgery    Consulting Physician: Dr. Allayne Gitelman      Reason for Consult:  Pleural Effusion      Assessment:   Anthony Alvarado is a 58 y.o. y/o male with aortic stenosis, T2DM, autoimmune cholangitis who had a liver transplant for cryptogenic cirrhosis on 09/16/18 who has suffered from recurrent right sided exudative pleural effusions of unknown etiology. Pleural fluid cytology consistent with an inflammatory process, no evidence of a malignant process. No evidence of ascites and exudative nature reduces our concern for hepatic hydrothorax. The CT scan and CXR after chest tube placement concerning for loculations and a small segment of trapped lung. This is a patient with a rapidly accumulating recurrent pleural effusion who is a good surgical candidate and would be best served by a pulmonary decortication to free up the trapped lung and a pleurodesis to prevent recurrence of pleural effusions. Given the patient's overall good prognosis and his expected immunosuppression from his transplant medications he would not be a good candidate for a pleurx catheter.    Recommendation/Plan:  - consented for Right VATS decortication and pleurodesis  - posted for OR tomorrow, first case with Dr. Oneida Alar  - NPO at midnight      This was discussed with the consulting attending physician, Dr. Allayne Gitelman, who agrees with the plan of care.    Please page the thoracic surgery consult pager [phone]) with any questions or change in patient status. We appreciate the consult.         History of Present Illness: Anthony Alvarado is a 58 y.o. y/o male with a past medical history of crypotgenic cirrhosis and HCC leading to OLT on 09/16/18 who has had 3 separate thoracentesis for recurrent right sided pleural effusion. All 3 have occurred in November of this year, separated by about a week each time. The first tap was exudative, no evidence of malignancy and showed pleural fluid with eosinophilic predominance, he was treated with a course of ivermectin. His second tap was also exudative without evidence of cancer and showed resolution of the eosinophilia. His most recent tap was yesterday with placement of a pigtail chest tube. CT scan prior to chest tube placement showed area of loculation in the R minor fissure. CXR after chest tube with persistent R basilar pneumothorax. The patient had chest pain and difficulty breathing when chest tube was placed to suction, he's had to keep it clamped or to water seal.    There isn't a clear inciting event that caused these pleural effusions. He denies pneumonia, fever, chest pain, shortness of breath or ascites prior to his first effusion. Patient does not have a history of smoking or excessive alcohol intake. No history or family history of lung cancer.      Allergies:  Allergies   Allergen Reactions   ??? Watermelon Flavor      Mouth itching       Medications:   Current Facility-Administered Medications   Medication Dose Route Frequency Provider Last Rate Last Dose   ??? acetaminophen (TYLENOL) tablet 650 mg  650 mg Oral Q4H PRN Dia Crawford, MD   650 mg at 01/11/19 1341   ??? amLODIPine (NORVASC) tablet 10 mg  10 mg Oral Daily Dia Crawford, MD   10 mg at 01/12/19 0910   ??? aspirin chewable tablet 81 mg  81 mg Oral Daily Dia Crawford,  MD   81 mg at 01/12/19 0911   ??? calcium carbonate (TUMS) chewable tablet 200 mg elem calcium  200 mg elem calcium Oral TID PRN Dia Crawford, MD ??? fluticasone propionate (FLONASE) 50 mcg/actuation nasal spray 1 spray  1 spray Each Nare Daily Dia Crawford, MD       ??? furosemide (LASIX) tablet 20 mg  20 mg Oral BID Dia Crawford, MD   20 mg at 01/12/19 0911   ??? gabapentin (NEURONTIN) capsule 300 mg  300 mg Oral BID Dia Crawford, MD   300 mg at 01/12/19 0911   ??? heparin (porcine) injection 5,000 Units  5,000 Units Subcutaneous [facility] Dia Crawford, MD   5,000 Units at 01/12/19 1409   ??? HYDROmorphone (PF) (DILAUDID) injection 0.5 mg  0.5 mg Intravenous Q3H PRN Drake Leach, PA       ??? levothyroxine (SYNTHROID) tablet 137 mcg  137 mcg Oral daily Dia Crawford, MD   137 mcg at 01/12/19 0654   ??? lidocaine (LIDODERM) 5 % patch 3 patch  3 patch Transdermal Daily Belva Chimes Kings[place], [place]   Stopped at 01/11/19 2126   ??? mycophenolate (MYFORTIC) EC tablet 360 mg  360 mg Oral BID Dia Crawford, MD   360 mg at 01/12/19 0910   ??? ondansetron (ZOFRAN-ODT) disintegrating tablet 4 mg  4 mg Oral Q6H PRN Dia Crawford, MD       ??? oxyCODONE (ROXICODONE) immediate release tablet 5 mg  5 mg Oral Q4H PRN Drake Leach, PA        Or   ??? oxyCODONE (ROXICODONE) immediate release tablet 10 mg  10 mg Oral Q4H PRN Drake Leach, PA   10 mg at 01/12/19 1610   ??? sertraline (ZOLOFT) tablet 25 mg  25 mg Oral Daily Dia Crawford, MD   25 mg at 01/12/19 0911   ??? tacrolimus (PROGRAF) capsule 1.5 mg  1.5 mg Oral BID Dia Crawford, MD   1.5 mg at 01/12/19 9604     Facility-Administered Medications Ordered in Other Encounters   Medication Dose Route Frequency Provider Last Rate Last Dose   ??? diazePAM (VALIUM) 5 mg/mL injection                The MAR has been reviewed and the patient has not received plavix or another irreversible anticoagulant in the last seven days.     Medical History:   Past Medical History:   Diagnosis Date   ??? Aortic valve stenosis    ??? Autoimmune cholangitis    ??? 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 ??? Cholestatic cirrhosis (CMS-HCC)    ??? Hepatic encephalopathy (CMS-HCC)    ??? Hypertension    ??? Sleep apnea    ??? Type 2 diabetes mellitus (CMS-HCC)        Dementia/neurocognitive dysfunction: no  Major psychiatric disorder: no    Surgical History:  Past Surgical History:   Procedure Laterality Date   ??? APPENDECTOMY     ??? CHG US GUIDE, TISSUE ABLATION N/A 05/03/2018    Procedure: ULTRASOUND GUIDANCE FOR, AND MONITORING OF, PARENCHYMAL TISSUE ABLATION;  Surgeon: Particia Nearing, MD;  Location: MAIN OR [facility];  Service: Transplant   ??? PR CATH PLACE/CORON ANGIO, IMG SUPER/INTERP,R&L HRT CATH, L HRT VENTRIC N/A 03/21/2018    Procedure: Left/Right Heart Catheterization;  Surgeon: Neal Dy, MD;  Location: [facility] CATH;  Service: Cardiology   ??? PR EGD FLEXIBLE FOREIGN BODY REMOVAL N/A 12/26/2018  Procedure: UGI ENDOSCOPY; W/REMOVAL FOREIGN BODY;  Surgeon: Vonda Antigua, MD;  Location: [facility];  Service: Gastroenterology   ??? PR LAP,ABLAT 1+ LIVER TUMOR(S),RADIOFREQ N/A 05/03/2018    Procedure: LAPAROSCOPY, SURGICAL, ABLATION OF 1 OR MORE LIVER TUMOR(S); RADIOFREQUENCY;  Surgeon: Particia Nearing, MD;  Location: MAIN OR [facility];  Service: Transplant   ??? PR PERQ DRAINAGE PLEURA INSERT CATH W/IMAGING N/A 01/11/2019    Procedure: PLEURAL DRAINAGE, PERC, W INSERTION OF INDWELLING CATHETER; W IMAGING GUIDANCE;  Surgeon: Jerelyn Charles, MD;  Location: BRONCH PROCEDURE LAB [facility];  Service: Pulmonary   ??? PR THORACENTESIS NEEDLE/CATH PLEURA W/IMAGING N/A 01/06/2019    Procedure: THORACENTESIS W/ IMAGING;  Surgeon: Cleora Fleet, MD;  Location: BRONCH PROCEDURE LAB [facility];  Service: Pulmonary   ??? PR TRANSPLANT LIVER,ALLOTRANSPLANT Bilateral 09/16/2018    Procedure: LIVER ALLOTRANSPLANTATION; ORTHOTOPIC, PARTIAL OR WHOLE, FROM CADAVER OR LIVING DONOR, ANY AGE;  Surgeon: Florene Glen, MD;  Location: MAIN OR [facility];  Service: Transplant ??? PR TRANSPLANT,PREP DONOR LIVER, WHOLE N/A 09/16/2018    Procedure: BACKBNCH STD PREP CAD DONOR WHOLE LIVER GFT PRIOR TNSPLNT,INC CHOLE,DISS/REM SURR TISSU WO TRISEG/LOBE SPLT;  Surgeon: Florene Glen, MD;  Location: MAIN OR [place];  Service: Transplant       Social History:  Social History     Tobacco Use   Smoking Status Never Smoker   Smokeless Tobacco Never Used     Social History     Substance and Sexual Activity   Alcohol Use Not Currently     Social History     Substance and Sexual Activity   Drug Use Never       Living arrangements - the patient lives with their family.      Family History:  Family History   Problem Relation Age of Onset   ??? Asthma Mother    ??? COPD Father    ??? Cancer Father    ??? Autoimmune disease Sister         AIH   ??? Heart disease Sister    ??? Colon cancer Maternal Grandfather          Review of Systems:  A 12 system review of systems was negative except as noted in HPI.    Physical Exam:  Vitals:    01/11/19 2013 01/11/19 2325 01/12/19 0407 01/12/19 0744   BP:  113/75 125/74 106/73   Pulse:  84 86 83   Resp:  18 16 16    Temp:  36.4 ??C 36.4 ??C 36.2 ??C   TempSrc:  Oral Oral Oral   SpO2:  96% 96% 94%   Weight: 81.4 kg (179 lb 7.3 oz)      Height: 172.7 cm (5' 8)        Vitals:    01/11/19 2013   Weight: 81.4 kg (179 lb 7.3 oz)       General: Well appearing male , looks comfortable in bed, no acute distress  HEENT: Normocephalic, atraumatic, pupils equal and reactive   Pulmonary: Normal work of breathing, equal bilateral chest rise, conversing easily on RA. Pigtail in place, serous output.  Cardiovascular: Regular rate and rhythm   Abdomen: Soft, non-distended, non-tender. Healed mercedes incision from liver transplant  Extremities: Moving all 4 extremities spontaneously against gravity.    Diagnostic Studies:    Data Review:    All lab results last 24 hours:    Recent Results (from the past 24 hour(s))   CBC    Collection Time:  01/12/19  7:49 AM   Result Value Ref Range WBC 12.7 (H) 4.5 - 11.0 10*9/L    RBC 4.71 4.50 - 5.90 10*12/L    HGB 12.0 (L) 13.5 - 17.5 g/dL    HCT 16.1 (L) 09.6 - 53.0 %    MCV 82.1 80.0 - 100.0 fL    MCH 25.6 (L) 26.0 - 34.0 pg    MCHC 31.2 31.0 - 37.0 g/dL    RDW 04.5 40.9 - 81.1 %    MPV 7.3 7.0 - 10.0 fL    Platelet 446 (H) 150 - 440 10*9/L   Basic Metabolic Panel    Collection Time: 01/12/19  7:49 AM   Result Value Ref Range    Sodium 135 135 - 145 mmol/L    Potassium 5.0 3.5 - 5.0 mmol/L    Chloride 101 98 - 107 mmol/L    CO2 24.0 22.0 - 30.0 mmol/L    Anion Gap 10 7 - 15 mmol/L    BUN 25 (H) 7 - 21 mg/dL    Creatinine 9.14 7.82 - 1.30 mg/dL    BUN/Creatinine Ratio 20     EGFR CKD-EPI Non-African American, Male 41 >=60 mL/min/1.59m2    EGFR CKD-EPI African American, Male 41 >=60 mL/min/1.18m2    Glucose 115 70 - 179 mg/dL    Calcium 9.3 8.5 - 95.6 mg/dL   Magnesium Level    Collection Time: 01/12/19  7:49 AM   Result Value Ref Range    Magnesium 1.7 1.6 - 2.2 mg/dL   Phosphorus Level    Collection Time: 01/12/19  7:49 AM   Result Value Ref Range    Phosphorus 4.2 2.9 - 4.7 mg/dL   Tacrolimus Level, Timed    Collection Time: 01/12/19  7:49 AM   Result Value Ref Range    Tacrolimus, Timed 5.5 ng/mL   Type and Screen    Collection Time: 01/12/19  2:27 PM   Result Value Ref Range    ABO Grouping AB POS     Antibody Screen NEG        Chest CT: Images and report reviewed  CXR: Images and report reviewed

## 2019-01-12 NOTE — Unmapped (Signed)
Review POC with patient who indicates understanding. VSS except for low oxygen 87% RA and Low BP during/after chest tube pigtail insertion this morning. MD notified and came to bedside. Pt placed on [place] 2 L. O2  now stable and WNL on 2 L [place]. BP now stable after albumin administration per orders. Urine output remains adequate voiding. Pt tolerating diet without complaint of nausea or vomiting. Pain controlled with scheduled & PRN pain medications. Pt ambulates standby assist without use of assistive device. No BM this shift. Pt remains free of falls and injury this shift. No pt questions or concerns at this time. Will continue to monitor.     Problem: Adult Inpatient Plan of Care  Goal: Plan of Care Review  Outcome: Progressing  Goal: Patient-Specific Goal (Individualization)  Outcome: Progressing  Goal: Absence of Hospital-Acquired Illness or Injury  Outcome: Progressing  Goal: Optimal Comfort and Wellbeing  Outcome: Progressing  Goal: Readiness for Transition of Care  Outcome: Progressing  Goal: Rounds/Family Conference  Outcome: Progressing     Problem: Pain Acute  Goal: Optimal Pain Control  Outcome: Progressing     Problem: Noninvasive Ventilation Acute  Goal: Effective Unassisted Ventilation and Oxygenation  Outcome: Progressing     Problem: Obstructive Sleep Apnea Risk or Actual (Comorbidity Management)  Goal: Unobstructed Breathing During Sleep  Outcome: Progressing

## 2019-01-12 NOTE — Unmapped (Signed)
Transplant Surgery Progress Note    Hospital Day: 8    Assessment & Plan:   Patient is a 58 y.o. male with cryptogenic cirrhosis and hepatocellular carcinoma s/p OLT 09/16/2018, aortic valve stenosis, type 2 diabetes, autoimmune cholangitis with recent drainage of intra-abdominal fluid collection by VIR, admitted recently 11/2-11/4 for thoracentesis with Interventional Pulmonology on 11/3. CXR from this morning shows recurrance of right sided pleural effusion now status post thoracentesis on 11/13.  ??  - Regular diet, ML  **Pleaural Effusion   - Thoracentesis with IP 11/13: F/u fluid cytology/cultures/fluid analysis   - CT chest 11/16: Interval worsening of large right pleural effusion with right middle and lower lobe collapse   - Plan for chest tube placement with IP 11/18   -post chest tube cxr with R basilar PTX, loculations   -SRT consulted for possible pleurodesis vs VATS, recs appreciated  - Appreciate ICID recommendations. Additional labs ordered. Ivermectin given.  - Resume home medication including immunosuppression and home lasix  - OOB, IS  - Home CPAP  - SQH  ??   If you have any questions, concerns or changes in the patient's clinical status, please page the [facility] floor pager. [phone]    Interval Events:     NAEON. Afebrile, VSS. Patient saturating well on room air, s/p pigtail 11/18.     Objective:        Vital Signs:  BP 124/80  - Pulse 84  - Temp 36.3 ??C (Oral)  - Resp 16  - Ht 172.7 cm (5' 8)  - Wt 81.4 kg (179 lb 7.3 oz)  - SpO2 93%  - BMI 27.29 kg/m??     Input/Output:  I/O last 3 completed shifts:  In: 240 [P.O.:240]  Out: 3650 [Urine:1625; Chest Tube:2025]    Physical Exam:  General: Cooperative, no distress, well appearing   Pulmonary: Normal work of breathing, equal bilateral chest rise.  Cardiac: Regular rate, HDS  Abdomen: Soft, non-distended, non-tender. Well healed mercedes incision from   Skin: No jaundice, rashes, erythema, or lesions. Skin color and texture normal. Neurologic: Alert and interactive, no motor abnormalities noted.       Labs:  Reviewed    Imaging:  All pertinent imaging personally reviewed.

## 2019-01-12 NOTE — Unmapped (Signed)
SRF CASE REVIEW    An interdisciplinary care conference was held today and included the following team members: Estanislado Emms, MD Transplant Surgeon   Chirag Lanney Gins, MD Transplant Surgeon  Jacqulyn Ducking, MD Transplant Surgeon  Cranston Neighbor, RN, CCTN TNC  Salem Senate, RN, MSN, TNC  Nunzio Cobbs, MD Transplant Nephrology Fellow  Lisbeth Ply, MD Transplant Nephrology  Carole Binning, LCSW/ CM Transplant Social Worker  Geannie Risen, LCSW/ CM Transplant Social Worker  Valentino Nose, [place] Transplant Dietitian  Trilby Leaver, MD Transplant Surgery Fellow  Odessa Fleming, PharmD  Newton Pigg, RN, DNP, Transplant Surgery Nurse Coordinator  Doyce Loose, MD Transplant Surgeon  Katherine Roan, MD Transplant Surgeon  Alden Server, MD General Surgery Resident    Per team, right pneumothorax and right pigtails placed via pulmonary IR; CT showed issue of fluid and re-consult thoracic with intention of VATS;     Discharge Plan: TBD; no services prior to admission; possible drain care?      Carole Binning, LCSWA  Transplant Case Manager  [facility]

## 2019-01-13 LAB — HISTOPLASMA ANTIBODY MYCELIAL CF: Histoplasma capsulatum mycelial phase Ab:Titr:Pt:Ser:Qn:Comp fix: NEGATIVE

## 2019-01-13 LAB — MAGNESIUM: Magnesium:MCnc:Pt:Ser/Plas:Qn:: 1.8

## 2019-01-13 LAB — COMPREHENSIVE METABOLIC PANEL
ALBUMIN: 3.6 g/dL (ref 3.5–5.0)
ANION GAP: 6 mmol/L — ABNORMAL LOW (ref 7–15)
BILIRUBIN TOTAL: 0.8 mg/dL (ref 0.0–1.2)
BLOOD UREA NITROGEN: 23 mg/dL — ABNORMAL HIGH (ref 7–21)
BUN / CREAT RATIO: 16
CALCIUM: 9.2 mg/dL (ref 8.5–10.2)
CHLORIDE: 98 mmol/L (ref 98–107)
CO2: 31 mmol/L — ABNORMAL HIGH (ref 22.0–30.0)
CREATININE: 1.44 mg/dL — ABNORMAL HIGH (ref 0.70–1.30)
EGFR CKD-EPI AA MALE: 62 mL/min/{1.73_m2} (ref >=60)
EGFR CKD-EPI NON-AA MALE: 54 mL/min/{1.73_m2} — ABNORMAL LOW (ref >=60)
GLUCOSE RANDOM: 108 mg/dL (ref 70–179)
POTASSIUM: 4.7 mmol/L (ref 3.5–5.0)
PROTEIN TOTAL: 6.1 g/dL — ABNORMAL LOW (ref 6.5–8.3)
SODIUM: 135 mmol/L (ref 135–145)

## 2019-01-13 LAB — CBC
HEMATOCRIT: 35.4 % — ABNORMAL LOW (ref 41.0–53.0)
HEMOGLOBIN: 11.4 g/dL — ABNORMAL LOW (ref 13.5–17.5)
MEAN CORPUSCULAR HEMOGLOBIN CONC: 32.3 g/dL (ref 31.0–37.0)
MEAN CORPUSCULAR HEMOGLOBIN: 26.1 pg (ref 26.0–34.0)
MEAN CORPUSCULAR VOLUME: 81 fL (ref 80.0–100.0)
MEAN PLATELET VOLUME: 8.2 fL (ref 7.0–10.0)
PLATELET COUNT: 384 10*9/L (ref 150–440)
RED CELL DISTRIBUTION WIDTH: 14.3 % (ref 12.0–15.0)
WBC ADJUSTED: 8.8 10*9/L (ref 4.5–11.0)

## 2019-01-13 LAB — PHOSPHORUS: Phosphate:MCnc:Pt:Ser/Plas:Qn:: 4.7

## 2019-01-13 LAB — PLATELET COUNT: Platelets:NCnc:Pt:Bld:Qn:Automated count: 384

## 2019-01-13 LAB — CO2: Carbon dioxide:SCnc:Pt:Ser/Plas:Qn:: 31 — ABNORMAL HIGH

## 2019-01-13 LAB — TACROLIMUS BLOOD: Lab: 6.3

## 2019-01-13 MED FILL — ALENDRONATE 70 MG TABLET: 28 days supply | Qty: 4 | Fill #1 | Status: AC

## 2019-01-13 MED FILL — GABAPENTIN 300 MG CAPSULE: 30 days supply | Qty: 60 | Fill #0 | Status: AC

## 2019-01-13 MED FILL — TACROLIMUS 0.5 MG CAPSULE: 30 days supply | Qty: 180 | Fill #0 | Status: AC

## 2019-01-13 MED FILL — MYCOPHENOLATE SODIUM 180 MG TABLET,DELAYED RELEASE: 30 days supply | Qty: 120 | Fill #4 | Status: AC

## 2019-01-13 MED FILL — ALENDRONATE 70 MG TABLET: ORAL | 28 days supply | Qty: 4 | Fill #1

## 2019-01-13 MED FILL — SULFAMETHOXAZOLE 400 MG-TRIMETHOPRIM 80 MG TABLET: 28 days supply | Qty: 12 | Fill #3 | Status: AC

## 2019-01-13 MED FILL — MYCOPHENOLATE SODIUM 180 MG TABLET,DELAYED RELEASE: ORAL | 30 days supply | Qty: 120 | Fill #4

## 2019-01-13 MED FILL — SULFAMETHOXAZOLE 400 MG-TRIMETHOPRIM 80 MG TABLET: ORAL | 28 days supply | Qty: 12 | Fill #3

## 2019-01-13 NOTE — Unmapped (Signed)
Pt is A&O x4 and verbalizes understanding of POC.  OOB with SBA this shift, no falls.  Right chest tube maintained per orders with ss-serous drainage; difficulty flushing saline towards the body, only able to flush 1 ml before meeting resistance; MD aware and continuing to monitor.  Tolerating regular diet, NPO since midnight for OR 11/20; no complaints of n/v.  Old incision C/D/I.  Pain managed well with scheduled and PRN meds.  CPAP on overnight.  VSS, no BM this shift, adequate UOP, no complaints or concerns.  Will continue to monitor.    Problem: Adult Inpatient Plan of Care  Goal: Plan of Care Review  Outcome: Ongoing - Unchanged  Goal: Patient-Specific Goal (Individualization)  Outcome: Ongoing - Unchanged  Goal: Absence of Hospital-Acquired Illness or Injury  Outcome: Ongoing - Unchanged  Goal: Optimal Comfort and Wellbeing  Outcome: Ongoing - Unchanged  Goal: Readiness for Transition of Care  Outcome: Ongoing - Unchanged  Goal: Rounds/Family Conference  Outcome: Ongoing - Unchanged     Problem: Pain Acute  Goal: Optimal Pain Control  Outcome: Ongoing - Unchanged     Problem: Noninvasive Ventilation Acute  Goal: Effective Unassisted Ventilation and Oxygenation  Outcome: Ongoing - Unchanged     Problem: Obstructive Sleep Apnea Risk or Actual (Comorbidity Management)  Goal: Unobstructed Breathing During Sleep  Outcome: Ongoing - Unchanged     Problem: Fall Injury Risk  Goal: Absence of Fall and Fall-Related Injury  Outcome: Ongoing - Unchanged

## 2019-01-13 NOTE — Unmapped (Signed)
SRF TEACHING ROUNDS    An interdisciplinary care conference was held today and included the following team members:An interdisciplinary care conference was held today and included the following team members: Jacqulyn Ducking, MD Transplant Surgeon , SRF Surgery Resident , Cranston Neighbor, RN Transplant Nurse Coordinator, Newton Pigg, RN, MSN  Transplant Nurse Coordinator, Justice Britain, LCSWA Transplant Case Manager, staff pharmacist and Valentino Nose, RD Transplant Dietician.     Per team, Patient's labs are stable and patient has good urine output. Patient received thoracic surgery.     Discharge Plan: No current discharge plans at this time.       Justice Britain, Theresia Majors  Transplant Case Manager  [facility]

## 2019-01-13 NOTE — Unmapped (Signed)
Operative Note  (CSN: [id number])    Date of Surgery: 01/13/2019    Pre-op Diagnosis: recurrent right pleural effusion    Post-op Diagnosis: recurrent right pleural effusion with entrapment of the right middle and lower lobes    Procedure(s):  1. Right thoracotomy  2. Total pulmonary decortication  3. Pleurodesis  4. Multi-level rib block with Exparel    Note: Revisions to procedures should be made in chart - see Procedures tab.    Performing Service: Thoracic  Surgeon(s) and Role:     * Evert Kohl, MD - Primary     * Seymour Bars, MD - Resident - Assisting    Anesthesia: General    Estimated Blood Loss: 50 mL    Complications: None    Specimens:   Orders Placed This Encounter   Procedures   ??? Cytology - Fluid     Standing Status:   Standing     Number of Occurrences:   1     Order Specific Question:   Specify Laterality:     Answer:   Right   ??? Cytology - Fluid     Standing Status:   Standing     Number of Occurrences:   1     Order Specific Question:   Specify Laterality:     Answer:   Right     Operative Findings: entrapment of the right middle and lower lobes with very thin rind; full expansion at the end of the case    Procedure Description:   The patient was identified in the preoperative holding area by name. He was brought to the operating room where a preinduction timeout was performed, confirming name, medical record number, and planned procedure. He was intubated with a double-lumen endotracheal tube. He was positioned in the left lateral decubitus position. Appropriate padding was placed. The chest was prepped and draped in sterile fashion. The previously placed chest tube was removed. Next, 3 VATS incisions were made, each approximately 2 cm in length, one in the eighth intercostal space in the anterior axillary line, and one in the fourth intercostal space along the anterior axillary line and the other in the sixth intercostal space below the tip of the scapula.There were mild adhesions to the upper lobe that came down easily with blunt dissection and a small amount of loculated fluid which was sent for culture. The middle and lower lobes were clearly not fully expanding although there was not an obvious rind. After attempting to find an edge of the rind to decort, we were unable to successfully do this because it was so fine and closely adherent to the pleura. Therefore the decision was made to extend the posterior VATS incision to a small posterolateral thoracotomy incision. This allowed for better visualization and manipulation of tissue planes. The rind on the lung was then able to be carefully peeled off to allow expansion of the lung. We ensured that the lung was able to fully re-expand when inflated. We went back on single lung ventilation and a piece of Bovie scratchpad was then used to abrade the entire chest wall and diaphragm. Doxycycline was also instilled into the pleural space to provide pleurodesis to the mediastinum and chest wall. Hemostasis was achieved. A rib block was performed under direct visualization using Exparel from the 2nd interspace to the 10th interspace. A 28 Fr chest tube was inserted through the most inferior incision and was placed posterior to the lung with the tip at the apex. The  anesthetist delivered a breath to the lung and the lung inflated well with no residual atelectasis or entrapment. The chest tube was secured with Nurolon sutures. The ribs were reapproximated with #2 Vicryl sutures, and then thoracotomy site was closed in layers as well as the remaining VATS incision, with 2-0 Vicryl sutures in the deep layers and 4-0 Vicryl in the skin. Dressings were applied. The patient was placed in the supine position, extubated and transferred to the recovery room in stable condition.     Sponge and needle counts were correct at the end of the case. The patient's chest tube will remain on suction for 48 hours postop.      Surgeon Notes: The attending physician was present and scrubbed for the key parts of the procedure and immediately available for the remainder.      Lewis Shock   Date: 01/13/2019  Time: 10:38 AM

## 2019-01-13 NOTE — Unmapped (Signed)
Transplant Surgery Progress Note    Hospital Day: 9    Assessment & Plan:   Patient is a 58 y.o. male with cryptogenic cirrhosis and hepatocellular carcinoma s/p OLT 09/16/2018, aortic valve stenosis, type 2 diabetes, autoimmune cholangitis with recent drainage of intra-abdominal fluid collection by VIR, admitted recently 11/2-11/4 for thoracentesis with Interventional Pulmonology on 11/3. CXR from this morning shows recurrance of right sided pleural effusion now status post thoracentesis on 11/13.  ??  - Regular diet, ML  **Pleaural Effusion   - Thoracentesis with IP 11/13: F/u fluid cytology/cultures/fluid analysis   - CT chest 11/16: Interval worsening of large right pleural effusion with right middle and lower lobe collapse   - Plan for chest tube placement with IP 11/18   -post chest tube cxr with R basilar PTX, loculations   -SRT consulted, now s/p pulmonary decortication with pleurodesis 11/20  - Appreciate ICID recommendations. Additional labs ordered. Ivermectin given.  - Resume home medication including immunosuppression and home lasix  - OOB, IS  - Home CPAP  - SQH  ??   If you have any questions, concerns or changes in the patient's clinical status, please page the [facility] floor pager. [phone]    Interval Events:     NAEON. Afebrile, VSS. Patient saturating well on room air, s/p decortication and pleurodesis with Thoracic surgery.     Objective:        Vital Signs:  BP 105/73  - Pulse 75  - Temp 36.4 ??C (Oral)  - Resp 18  - Ht 172.7 cm (5' 8)  - Wt 81.4 kg (179 lb 7.3 oz)  - SpO2 95%  - BMI 27.29 kg/m??     Input/Output:  I/O last 3 completed shifts:  In: 630 [P.O.:630]  Out: 2335 [Urine:2175; Chest Tube:160]    Physical Exam:  General: Cooperative, no distress, well appearing   Pulmonary: Normal work of breathing, equal bilateral chest rise.  Cardiac: Regular rate, HDS  Abdomen: Soft, non-distended, non-tender. Well healed mercedes incision from Skin: No jaundice, rashes, erythema, or lesions. Skin color and texture normal.  Neurologic: Alert and interactive, no motor abnormalities noted.       Labs:  Reviewed    Imaging:  All pertinent imaging personally reviewed.

## 2019-01-13 NOTE — Unmapped (Signed)
Brief Operative Note  (CSN: [id number])    Date of Surgery: 01/13/2019    Pre-op Diagnosis: recurrent right pleural effusion    Post-op Diagnosis: recurrent right pleural effusion with entrapment of the right middle and lower lobes    Procedure(s):  THORACOSCOPY SURG; W/PART PULM DECORTIC: 32651 (CPT??)  THORACOSCOPY, SURGICAL; WITH PLEURODESIS (EG, MECHANICAL OR CHEMICAL): 95621 (CPT??)  Note: Revisions to procedures should be made in chart - see Procedures activity.    Performing Service: Thoracic  Surgeon(s) and Role:     * Evert Kohl, MD - Primary     * Seymour Bars, MD - Resident - Assisting    Assistant: None     Findings: entrapment of the right middle and lower lobes with very thin rind; full expansion at the end of the case    Anesthesia: General    Estimated Blood Loss: 50 mL    Complications: None    Specimens:   ID Type Source Tests Collected by Time Destination   A : right pleural rind Washing, Bronchial Lung, Right FUNGAL CULTURE, AFB CULTURE Evert Kohl, MD 01/13/2019 0830    B : right pleural rind Tissue Chest AEROBIC/ANAEROBIC CULTURE Evert Kohl, MD 01/13/2019 0830        Implants: * No implants in log *    Surgeon Notes: The attending physician was present and scrubbed for the key parts of the procedure and immediately available for the remainder.      Lewis Shock   Date: 01/13/2019  Time: 10:29 AM

## 2019-01-14 LAB — CBC
HEMATOCRIT: 38.9 % — ABNORMAL LOW (ref 41.0–53.0)
HEMOGLOBIN: 12.1 g/dL — ABNORMAL LOW (ref 13.5–17.5)
MEAN CORPUSCULAR HEMOGLOBIN CONC: 31.1 g/dL (ref 31.0–37.0)
MEAN CORPUSCULAR VOLUME: 82.2 fL (ref 80.0–100.0)
MEAN PLATELET VOLUME: 7.6 fL (ref 7.0–10.0)
PLATELET COUNT: 429 10*9/L (ref 150–440)
RED BLOOD CELL COUNT: 4.72 10*12/L (ref 4.50–5.90)
RED CELL DISTRIBUTION WIDTH: 14.1 % (ref 12.0–15.0)
WBC ADJUSTED: 27.9 10*9/L — ABNORMAL HIGH (ref 4.5–11.0)

## 2019-01-14 LAB — COMPREHENSIVE METABOLIC PANEL
ALBUMIN: 3.7 g/dL (ref 3.5–5.0)
ALKALINE PHOSPHATASE: 270 U/L — ABNORMAL HIGH (ref 38–126)
ALT (SGPT): 177 U/L — ABNORMAL HIGH (ref <50)
ANION GAP: 11 mmol/L (ref 7–15)
AST (SGOT): 95 U/L — ABNORMAL HIGH (ref 19–55)
BILIRUBIN TOTAL: 0.8 mg/dL (ref 0.0–1.2)
BLOOD UREA NITROGEN: 29 mg/dL — ABNORMAL HIGH (ref 7–21)
CALCIUM: 9.1 mg/dL (ref 8.5–10.2)
CHLORIDE: 97 mmol/L — ABNORMAL LOW (ref 98–107)
CO2: 26 mmol/L (ref 22.0–30.0)
CREATININE: 1.68 mg/dL — ABNORMAL HIGH (ref 0.70–1.30)
EGFR CKD-EPI AA MALE: 52 mL/min/{1.73_m2} — ABNORMAL LOW (ref >=60)
EGFR CKD-EPI NON-AA MALE: 45 mL/min/{1.73_m2} — ABNORMAL LOW (ref >=60)
GLUCOSE RANDOM: 130 mg/dL (ref 70–179)
POTASSIUM: 5.5 mmol/L — ABNORMAL HIGH (ref 3.5–5.0)
PROTEIN TOTAL: 6.1 g/dL — ABNORMAL LOW (ref 6.5–8.3)
SODIUM: 134 mmol/L — ABNORMAL LOW (ref 135–145)

## 2019-01-14 LAB — CHLORIDE: Chloride:SCnc:Pt:Ser/Plas:Qn:: 97 — ABNORMAL LOW

## 2019-01-14 LAB — PHOSPHORUS: Phosphate:MCnc:Pt:Ser/Plas:Qn:: 4.4

## 2019-01-14 LAB — RED BLOOD CELL COUNT: Lab: 4.72

## 2019-01-14 LAB — MAGNESIUM: Magnesium:MCnc:Pt:Ser/Plas:Qn:: 1.5 — ABNORMAL LOW

## 2019-01-14 LAB — TACROLIMUS BLOOD: Lab: 5.2

## 2019-01-14 NOTE — Unmapped (Signed)
Thoracic Surgery Consult Follow-up Note      Assessment/Plan:     Anthony Alvarado is a 58 y.o. y/o male with a history of orthotopic liver transplant who suffered from recurrent exudative pleural effusions. He is s/p open thoractomy R pulmonary decortication with pleurodesis on 01/13/19. He tolerated the procedure without incident and is recovering well. Pain control limiting deep breathing is his biggest issue.    Recommendations:  ?? Maintain chest tube to suction for 48 hours  ?? Daily CXR  ?? Double lasix dose today, 40mg  BID x 2doses  ?? Increase dilaudid PCA to 0.3 q27min  ?? Increase Gabapentin to 600 TID  ?? IPV with RT QID  ?? Pulmonary toilet  ?? Rest of care per primary team  ?? Thoracic Surgery team will continue to follow    Interval History/Events  - Pain poorly controlled  - He feels like he can't take a deep breath    Objective:     Physical Exam:  Temp:  [35.8 ??C-37.8 ??C] 36.3 ??C  Heart Rate:  [86-98] 89  Resp:  [16-18] 18  BP: (115-131)/(77-92) 115/78  MAP (mmHg):  [90-106] 92  FiO2 (%):  [32 %] 32 %  SpO2:  [86 %-96 %] 94 %  General: awake, alert and oriented x 3, looks mildly uncomfortable  Chest: Normal work of breathing, chest tube in place, 290cc serosanguinous output, no airleak on suction  CVS: RRR  ABD: soft, NT, ND  Ext: warm and well perfused    Data Review:    All lab results last 24 hours:    Recent Results (from the past 24 hour(s))   CBC    Collection Time: 01/14/19  5:00 AM   Result Value Ref Range    WBC 27.9 (H) 4.5 - 11.0 10*9/L    RBC 4.72 4.50 - 5.90 10*12/L    HGB 12.1 (L) 13.5 - 17.5 g/dL    HCT 16.1 (L) 09.6 - 53.0 %    MCV 82.2 80.0 - 100.0 fL    MCH 25.6 (L) 26.0 - 34.0 pg    MCHC 31.1 31.0 - 37.0 g/dL    RDW 04.5 40.9 - 81.1 %    MPV 7.6 7.0 - 10.0 fL    Platelet 429 150 - 440 10*9/L   Magnesium Level    Collection Time: 01/14/19  5:00 AM   Result Value Ref Range    Magnesium 1.5 (L) 1.6 - 2.2 mg/dL   Phosphorus Level    Collection Time: 01/14/19  5:00 AM   Result Value Ref Range Phosphorus 4.4 2.9 - 4.7 mg/dL   Tacrolimus Level, Timed    Collection Time: 01/14/19  5:00 AM   Result Value Ref Range    Tacrolimus, Timed 5.2 ng/mL   Comprehensive Metabolic Panel    Collection Time: 01/14/19  5:00 AM   Result Value Ref Range    Sodium 134 (L) 135 - 145 mmol/L    Potassium 5.5 (H) 3.5 - 5.0 mmol/L    Chloride 97 (L) 98 - 107 mmol/L    Anion Gap 11 7 - 15 mmol/L    CO2 26.0 22.0 - 30.0 mmol/L    BUN 29 (H) 7 - 21 mg/dL    Creatinine 9.14 (H) 0.70 - 1.30 mg/dL    BUN/Creatinine Ratio 17     EGFR CKD-EPI Non-African American, Male 45 (L) >=60 mL/min/1.80m2    EGFR CKD-EPI African American, Male 52 (L) >=60 mL/min/1.7m2    Glucose 130 70 -  179 mg/dL    Calcium 9.1 8.5 - 62.3 mg/dL    Albumin 3.7 3.5 - 5.0 g/dL    Total Protein 6.1 (L) 6.5 - 8.3 g/dL    Total Bilirubin 0.8 0.0 - 1.2 mg/dL    AST 95 (H) 19 - 55 U/L    ALT 177 (H) <50 U/L    Alkaline Phosphatase 270 (H) 38 - 126 U/L       Imaging: Radiology studies were personally reviewed  CXR: increased opacity in R LL, likely atelectasis vs. Pulmonary congestion

## 2019-01-14 NOTE — Unmapped (Signed)
Transplant Surgery Progress Note    Hospital Day: 10    Assessment & Plan:   Patient is a 58 y.o. male with cryptogenic cirrhosis and hepatocellular carcinoma s/p OLT 09/16/2018, aortic valve stenosis, type 2 diabetes, autoimmune cholangitis with recent drainage of intra-abdominal fluid collection by VIR, admitted recently 11/2-11/4 for thoracentesis with Interventional Pulmonology on 11/3. CXR from this morning shows recurrance of right sided pleural effusion now status post thoracentesis on 11/13.  ??  - Regular diet, ML  **Pleaural Effusion   - Thoracentesis with IP 11/13: F/u fluid cytology/cultures/fluid analysis   - CT chest 11/16: Interval worsening of large right pleural effusion with right middle and lower lobe collapse   - Plan for chest tube placement with IP 11/18   -post chest tube cxr with R basilar PTX, loculations   -SRT consulted, now s/p pulmonary decortication with pleurodesis 11/20  - Appreciate ICID recommendations. Additional labs ordered. Ivermectin given.  - Resume home medication including immunosuppression and home lasix  - OOB, IS  - Home CPAP  - SQH  ??   If you have any questions, concerns or changes in the patient's clinical status, please page the [facility] floor pager. [phone]    Interval Events:     NAEON. Afebrile, VSS. Patient saturating well on room air, pain well controlled.     Objective:        Vital Signs:  BP 129/92  - Pulse 88  - Temp 35.8 ??C (Axillary)  - Resp 18  - Ht 172.7 cm (5' 8)  - Wt 81.4 kg (179 lb 7.3 oz)  - SpO2 96%  - BMI 27.29 kg/m??     Input/Output:  I/O last 3 completed shifts:  In: 2070 [P.O.:1070; I.V.:500; IV Piggyback:500]  Out: 1980 [Urine:1610; Blood:50; Chest Tube:320]    Physical Exam:  General: Cooperative, no distress, well appearing   Pulmonary: Normal work of breathing, equal bilateral chest rise. Right CT in place, no air leak noted.  Cardiac: Regular rate, HDS  Abdomen: Soft, non-distended, non-tender. Well healed mercedes incision from Skin: No jaundice, rashes, erythema, or lesions. Skin color and texture normal.  Neurologic: Alert and interactive, no motor abnormalities noted.       Labs:  Reviewed    Imaging:  All pertinent imaging personally reviewed.

## 2019-01-14 NOTE — Unmapped (Signed)
Review POC with patient who indicates understanding. Pt had a pleurodesis today with new chest tube placement now to -20 wall suction. Chest tube leaking at site team aware. Slight air leak in chest tube team aware. VSS. O2 stable and WNL on room air. Urine output remains adequate voiding. Pt tolerating clears diet without complaint of nausea or vomiting. Pain controlled with PCA pump dilaudid. Pt ambulates without use of assistive device independendt. No BM this shift. Pt remains free of falls and injury this shift. No pt questions or concerns at this time. Will continue to monitor.     Problem: Adult Inpatient Plan of Care  Goal: Plan of Care Review  Outcome: Progressing  Goal: Patient-Specific Goal (Individualization)  Outcome: Progressing  Goal: Absence of Hospital-Acquired Illness or Injury  Outcome: Progressing  Goal: Optimal Comfort and Wellbeing  Outcome: Progressing  Goal: Readiness for Transition of Care  Outcome: Progressing  Goal: Rounds/Family Conference  Outcome: Progressing     Problem: Pain Acute  Goal: Optimal Pain Control  Outcome: Progressing     Problem: Noninvasive Ventilation Acute  Goal: Effective Unassisted Ventilation and Oxygenation  Outcome: Progressing     Problem: Obstructive Sleep Apnea Risk or Actual (Comorbidity Management)  Goal: Unobstructed Breathing During Sleep  Outcome: Progressing     Problem: Fall Injury Risk  Goal: Absence of Fall and Fall-Related Injury  Outcome: Progressing     Problem: Wound  Goal: Optimal Wound Healing  Outcome: Progressing     Problem: Skin Injury Risk Increased  Goal: Skin Health and Integrity  Outcome: Progressing

## 2019-01-14 NOTE — Unmapped (Signed)
VSS, satting 88-89% on roomair, 2L [place] applied. Pt up ad lib, steady on feet, no falls. Pt tolerating diet well, no complaints of nausea/vomiting. Pt passing flatus, having BMs. Voiding, amber, diminished urine output overnight, MD made aware, one time dose of albumin infusing now. R chest tube to - wall suction, serosang output, ~50cc., leaking around site, dressing changed x2. PCA for pain management, pt states adequate, will continue to monitor.       Problem: Adult Inpatient Plan of Care  Goal: Plan of Care Review  Outcome: Progressing  Goal: Patient-Specific Goal (Individualization)  Outcome: Progressing  Goal: Absence of Hospital-Acquired Illness or Injury  Outcome: Progressing  Goal: Optimal Comfort and Wellbeing  Outcome: Progressing  Goal: Readiness for Transition of Care  Outcome: Progressing  Goal: Rounds/Family Conference  Outcome: Progressing     Problem: Pain Acute  Goal: Optimal Pain Control  Outcome: Progressing     Problem: Noninvasive Ventilation Acute  Goal: Effective Unassisted Ventilation and Oxygenation  Outcome: Progressing     Problem: Obstructive Sleep Apnea Risk or Actual (Comorbidity Management)  Goal: Unobstructed Breathing During Sleep  Outcome: Progressing     Problem: Fall Injury Risk  Goal: Absence of Fall and Fall-Related Injury  Outcome: Progressing     Problem: Wound  Goal: Optimal Wound Healing  Outcome: Progressing     Problem: Skin Injury Risk Increased  Goal: Skin Health and Integrity  Outcome: Progressing

## 2019-01-15 LAB — COMPREHENSIVE METABOLIC PANEL
ALBUMIN: 3.7 g/dL (ref 3.5–5.0)
ALKALINE PHOSPHATASE: 164 U/L — ABNORMAL HIGH (ref 38–126)
ALT (SGPT): 78 U/L — ABNORMAL HIGH (ref <50)
ANION GAP: 12 mmol/L (ref 7–15)
AST (SGOT): 30 U/L (ref 19–55)
BILIRUBIN TOTAL: 0.8 mg/dL (ref 0.0–1.2)
BLOOD UREA NITROGEN: 28 mg/dL — ABNORMAL HIGH (ref 7–21)
CALCIUM: 9 mg/dL (ref 8.5–10.2)
CHLORIDE: 95 mmol/L — ABNORMAL LOW (ref 98–107)
CREATININE: 1.29 mg/dL (ref 0.70–1.30)
EGFR CKD-EPI AA MALE: 71 mL/min/{1.73_m2} (ref >=60)
EGFR CKD-EPI NON-AA MALE: 62 mL/min/{1.73_m2} (ref >=60)
GLUCOSE RANDOM: 118 mg/dL (ref 70–179)
POTASSIUM: 4.7 mmol/L (ref 3.5–5.0)
PROTEIN TOTAL: 5.8 g/dL — ABNORMAL LOW (ref 6.5–8.3)
SODIUM: 131 mmol/L — ABNORMAL LOW (ref 135–145)

## 2019-01-15 LAB — PHOSPHORUS: Phosphate:MCnc:Pt:Ser/Plas:Qn:: 4.2

## 2019-01-15 LAB — MAGNESIUM: Magnesium:MCnc:Pt:Ser/Plas:Qn:: 1.5 — ABNORMAL LOW

## 2019-01-15 LAB — CBC
HEMATOCRIT: 29.1 % — ABNORMAL LOW (ref 41.0–53.0)
MEAN CORPUSCULAR HEMOGLOBIN CONC: 31.6 g/dL (ref 31.0–37.0)
MEAN CORPUSCULAR HEMOGLOBIN: 25.9 pg — ABNORMAL LOW (ref 26.0–34.0)
MEAN CORPUSCULAR VOLUME: 82 fL (ref 80.0–100.0)
MEAN PLATELET VOLUME: 7.8 fL (ref 7.0–10.0)
PLATELET COUNT: 330 10*9/L (ref 150–440)
RED BLOOD CELL COUNT: 3.55 10*12/L — ABNORMAL LOW (ref 4.50–5.90)
WBC ADJUSTED: 17.8 10*9/L — ABNORMAL HIGH (ref 4.5–11.0)

## 2019-01-15 LAB — COCCIDIOIDES AB: Coccidioides immitis Ab:Titr:Pt:Ser:Qn:Comp fix: NEGATIVE

## 2019-01-15 LAB — TACROLIMUS BLOOD: Lab: 4.9

## 2019-01-15 LAB — COCCIDIOIDES ANTIBODIES
COCCIDIOIDES AB,IGG: NEGATIVE
COCCIDIOIDES AB,IGM: NEGATIVE
COCCIDIOIDES AB: NEGATIVE

## 2019-01-15 LAB — ANION GAP: Anion gap 3:SCnc:Pt:Ser/Plas:Qn:: 12

## 2019-01-15 LAB — RED CELL DISTRIBUTION WIDTH: Lab: 14

## 2019-01-15 NOTE — Unmapped (Signed)
Transplant Surgery Progress Note    Hospital Day: 11    Assessment & Plan:   Patient is a 58 y.o. male with cryptogenic cirrhosis and hepatocellular carcinoma s/p OLT 09/16/2018, aortic valve stenosis, type 2 diabetes, autoimmune cholangitis with recent drainage of intra-abdominal fluid collection by VIR, admitted recently 11/2-11/4 for thoracentesis with Interventional Pulmonology on 11/3. CXR from this morning shows recurrance of right sided pleural effusion now status post thoracentesis on 11/13.  ??  - Regular diet, ML  **Pleaural Effusion   - Thoracentesis with IP 11/13: F/u fluid cytology/cultures/fluid analysis   - CT chest 11/16: Interval worsening of large right pleural effusion with right middle and lower lobe collapse   -chest tube placement with IP 11/18   -post chest tube cxr with R basilar PTX, loculations   -SRT consulted, now s/p pulmonary decortication with pleurodesis 11/20  -CT to water seal today  - Appreciate ICID recommendations. Additional labs ordered. Ivermectin given.  -Required 500cc 5% Albumin x2 overnight for decreased UOP likely in setting of large pleural effusion drainage and operative fluid losses, UOP responsive to 60cc/hr this AM.  -Will increase lasix per SRT 11/22, will return to 20mg  BID on 11/23  - Resume home medication including immunosuppression and home lasix  - OOB, IS  - Home CPAP  - SQH  ??   If you have any questions, concerns or changes in the patient's clinical status, please page the [facility] floor pager. [phone]    Interval Events:     NAEON. Afebrile, VSS. Patient saturating well on room air, pain well controlled. Low UOP on 11/21, requiring 500cc 5% albumin bolus x2 with appropriate UOP response s/p bolus    Objective:        Vital Signs:  BP 119/65  - Pulse 83  - Temp 36.4 ??C (Oral)  - Resp 18  - Ht 172.7 cm (5' 8)  - Wt 81.4 kg (179 lb 7.3 oz)  - SpO2 94%  - BMI 27.29 kg/m??     Input/Output:  I/O last 3 completed shifts:  In: 1520 [P.O.:1520] Out: 1960 [Urine:1780; Chest Tube:180]    Physical Exam:  General: Cooperative, no distress, well appearing   Pulmonary: Normal work of breathing, equal bilateral chest rise. Right CT in place, no air leak noted.  Cardiac: Regular rate, HDS  Abdomen: Soft, non-distended, non-tender. Well healed mercedes incision from   Skin: No jaundice, rashes, erythema, or lesions. Skin color and texture normal.  Neurologic: Alert and interactive, no motor abnormalities noted.       Labs:  Reviewed    Imaging:  All pertinent imaging personally reviewed.

## 2019-01-15 NOTE — Unmapped (Signed)
Thoracic Surgery Consult Follow-up Note      Assessment/Plan:     Anthony Alvarado is a 58 y.o. y/o male with a history of orthotopic liver transplant who suffered from recurrent exudative pleural effusions. He is s/p open thoractomy R pulmonary decortication with pleurodesis on 01/13/19. He tolerated the procedure without incident and is recovering well.    Recommendations:  ?? Chest tube to waterseal  ?? Daily CXR  ?? Another round of double lasix dose today, 40mg  BID x 2doses  ?? Continue IPV & pulmonary toilet  ?? Rest of care per primary team  ?? Thoracic Surgery team will continue to follow    Interval History/Events  - Pain control was better  - Breathing feels easier  - Thoracotomy dressing taken down today    Objective:     Physical Exam:  Temp:  [35.7 ??C-37.2 ??C] 36.4 ??C  Heart Rate:  [83-89] 83  Resp:  [16-18] 18  BP: (115-131)/(65-78) 119/65  MAP (mmHg):  [87-98] 87  FiO2 (%):  [2.5 %] 2.5 %  SpO2:  [90 %-97 %] 94 %  General: awake, alert and oriented x 3, looks mildly uncomfortable  Chest: Normal work of breathing, chest tube in place, 60cc serosanguinous output, no airleak, transitioned to waterseal  CVS: RRR  ABD: soft, NT, ND  Ext: warm and well perfused    Data Review:    All lab results last 24 hours:    Recent Results (from the past 24 hour(s))   CBC    Collection Time: 01/15/19  5:34 AM   Result Value Ref Range    WBC 17.8 (H) 4.5 - 11.0 10*9/L    RBC 3.55 (L) 4.50 - 5.90 10*12/L    HGB 9.2 (L) 13.5 - 17.5 g/dL    HCT 16.1 (L) 09.6 - 53.0 %    MCV 82.0 80.0 - 100.0 fL    MCH 25.9 (L) 26.0 - 34.0 pg    MCHC 31.6 31.0 - 37.0 g/dL    RDW 04.5 40.9 - 81.1 %    MPV 7.8 7.0 - 10.0 fL    Platelet 330 150 - 440 10*9/L   Magnesium Level    Collection Time: 01/15/19  5:34 AM   Result Value Ref Range    Magnesium 1.5 (L) 1.6 - 2.2 mg/dL   Phosphorus Level    Collection Time: 01/15/19  5:34 AM   Result Value Ref Range    Phosphorus 4.2 2.9 - 4.7 mg/dL   Comprehensive Metabolic Panel Collection Time: 01/15/19  5:34 AM   Result Value Ref Range    Sodium 131 (L) 135 - 145 mmol/L    Potassium 4.7 3.5 - 5.0 mmol/L    Chloride 95 (L) 98 - 107 mmol/L    Anion Gap 12 7 - 15 mmol/L    CO2 24.0 22.0 - 30.0 mmol/L    BUN 28 (H) 7 - 21 mg/dL    Creatinine 9.14 7.82 - 1.30 mg/dL    BUN/Creatinine Ratio 22     EGFR CKD-EPI Non-African American, Male 23 >=60 mL/min/1.55m2    EGFR CKD-EPI African American, Male 9 >=60 mL/min/1.60m2    Glucose 118 70 - 179 mg/dL    Calcium 9.0 8.5 - 95.6 mg/dL    Albumin 3.7 3.5 - 5.0 g/dL    Total Protein 5.8 (L) 6.5 - 8.3 g/dL    Total Bilirubin 0.8 0.0 - 1.2 mg/dL    AST 30 19 - 55 U/L    ALT 78 (  H) <50 U/L    Alkaline Phosphatase 164 (H) 38 - 126 U/L       Imaging: Radiology studies were personally reviewed  CXR: persistent opacity in R LL, likely pulmonary congestion

## 2019-01-15 NOTE — Unmapped (Signed)
VSS, afebrile. Pt up ad lib, steady on feet, no falls. Pt tolerating diet okay, no nausea/vomiting reported. States pain is somewhat controlled with PCA, prn oxy 5mg  administered x1. Chest tube remains in place, leaking around site, MDs aware. Increased urine output this shift, adequate, clear/amber. Passing flatus, reported last BM 11/20. Will continue to monitor.       Problem: Adult Inpatient Plan of Care  Goal: Plan of Care Review  Outcome: Progressing  Goal: Patient-Specific Goal (Individualization)  Outcome: Progressing  Goal: Absence of Hospital-Acquired Illness or Injury  Outcome: Progressing  Goal: Optimal Comfort and Wellbeing  Outcome: Progressing  Goal: Readiness for Transition of Care  Outcome: Progressing  Goal: Rounds/Family Conference  Outcome: Progressing     Problem: Pain Acute  Goal: Optimal Pain Control  Outcome: Progressing     Problem: Noninvasive Ventilation Acute  Goal: Effective Unassisted Ventilation and Oxygenation  Outcome: Progressing     Problem: Obstructive Sleep Apnea Risk or Actual (Comorbidity Management)  Goal: Unobstructed Breathing During Sleep  Outcome: Progressing     Problem: Fall Injury Risk  Goal: Absence of Fall and Fall-Related Injury  Outcome: Progressing     Problem: Wound  Goal: Optimal Wound Healing  Outcome: Progressing     Problem: Skin Injury Risk Increased  Goal: Skin Health and Integrity  Outcome: Progressing

## 2019-01-15 NOTE — Unmapped (Signed)
A&O x4.  VSs stable, pain adequately managed.  Right chest tube remains in place, removed from wall suction this morning by MDs.  Minimal SS output in canister.  UO adequate,  Reports BM today.  Tolerating solid food.  Ambulates independently,  no falls/injury.        Problem: Adult Inpatient Plan of Care  Goal: Plan of Care Review  Outcome: Progressing  Goal: Patient-Specific Goal (Individualization)  Outcome: Progressing  Goal: Absence of Hospital-Acquired Illness or Injury  Outcome: Progressing  Goal: Optimal Comfort and Wellbeing  Outcome: Progressing  Goal: Readiness for Transition of Care  Outcome: Progressing  Goal: Rounds/Family Conference  Outcome: Progressing     Problem: Pain Acute  Goal: Optimal Pain Control  Outcome: Progressing     Problem: Noninvasive Ventilation Acute  Goal: Effective Unassisted Ventilation and Oxygenation  Outcome: Progressing     Problem: Obstructive Sleep Apnea Risk or Actual (Comorbidity Management)  Goal: Unobstructed Breathing During Sleep  Outcome: Progressing     Problem: Fall Injury Risk  Goal: Absence of Fall and Fall-Related Injury  Outcome: Progressing     Problem: Wound  Goal: Optimal Wound Healing  Outcome: Progressing     Problem: Skin Injury Risk Increased  Goal: Skin Health and Integrity  Outcome: Progressing

## 2019-01-16 DIAGNOSIS — Z944 Liver transplant status: Principal | ICD-10-CM

## 2019-01-16 DIAGNOSIS — Z79899 Other long term (current) drug therapy: Principal | ICD-10-CM

## 2019-01-16 DIAGNOSIS — E612 Magnesium deficiency: Principal | ICD-10-CM

## 2019-01-16 LAB — CBC
HEMATOCRIT: 29.6 % — ABNORMAL LOW (ref 41.0–53.0)
HEMOGLOBIN: 9.1 g/dL — ABNORMAL LOW (ref 13.5–17.5)
MEAN CORPUSCULAR HEMOGLOBIN CONC: 30.7 g/dL — ABNORMAL LOW (ref 31.0–37.0)
MEAN CORPUSCULAR HEMOGLOBIN: 25.1 pg — ABNORMAL LOW (ref 26.0–34.0)
MEAN CORPUSCULAR VOLUME: 81.8 fL (ref 80.0–100.0)
MEAN PLATELET VOLUME: 7.6 fL (ref 7.0–10.0)
PLATELET COUNT: 360 10*9/L (ref 150–440)
RED BLOOD CELL COUNT: 3.61 10*12/L — ABNORMAL LOW (ref 4.50–5.90)
RED CELL DISTRIBUTION WIDTH: 14.3 % (ref 12.0–15.0)

## 2019-01-16 LAB — COMPREHENSIVE METABOLIC PANEL
ALBUMIN: 3.3 g/dL — ABNORMAL LOW (ref 3.5–5.0)
ALKALINE PHOSPHATASE: 160 U/L — ABNORMAL HIGH (ref 38–126)
ALT (SGPT): 59 U/L — ABNORMAL HIGH (ref <50)
ANION GAP: 14 mmol/L (ref 7–15)
AST (SGOT): 28 U/L (ref 19–55)
BILIRUBIN TOTAL: 0.5 mg/dL (ref 0.0–1.2)
BLOOD UREA NITROGEN: 31 mg/dL — ABNORMAL HIGH (ref 7–21)
BUN / CREAT RATIO: 27
CALCIUM: 9.1 mg/dL (ref 8.5–10.2)
CHLORIDE: 94 mmol/L — ABNORMAL LOW (ref 98–107)
CREATININE: 1.14 mg/dL (ref 0.70–1.30)
EGFR CKD-EPI AA MALE: 83 mL/min/{1.73_m2} (ref >=60)
EGFR CKD-EPI NON-AA MALE: 71 mL/min/{1.73_m2} (ref >=60)
POTASSIUM: 4.2 mmol/L (ref 3.5–5.0)
PROTEIN TOTAL: 5.6 g/dL — ABNORMAL LOW (ref 6.5–8.3)
SODIUM: 134 mmol/L — ABNORMAL LOW (ref 135–145)

## 2019-01-16 LAB — MAGNESIUM: Magnesium:MCnc:Pt:Ser/Plas:Qn:: 1.6

## 2019-01-16 LAB — TACROLIMUS BLOOD: Lab: 9.1

## 2019-01-16 LAB — RED BLOOD CELL COUNT: Lab: 3.61 — ABNORMAL LOW

## 2019-01-16 LAB — ALKALINE PHOSPHATASE: Alkaline phosphatase:CCnc:Pt:Ser/Plas:Qn:: 160 — ABNORMAL HIGH

## 2019-01-16 LAB — PHOSPHORUS: Phosphate:MCnc:Pt:Ser/Plas:Qn:: 4.3

## 2019-01-16 NOTE — Unmapped (Signed)
Tacrolimus Therapeutic Monitoring Pharmacy Note    Anthony Alvarado is a 58 y.o. male continuing tacrolimus.     Indication: Liver transplant     Date of Transplant: 09/16/18      Prior Dosing Information: Current regimen tacrolimus 2 mg twice daily      Goals:  Therapeutic Drug Levels  Tacrolimus trough goal: 6-8 ng/mL    Additional Clinical Monitoring/Outcomes  ?? Monitor renal function (SCr and urine output) and liver function (LFTs)  ?? Monitor for signs/symptoms of adverse events (e.g., hyperglycemia, hyperkalemia, hypomagnesemia, hypertension, headache, tremor)    Results:   Tacrolimus level: 9.1 ng/mL, drawn appropriately    Pharmacokinetic Considerations and Significant Drug Interactions:  ? Concurrent hepatotoxic medications: None identified  ? Concurrent CYP3A4 substrates/inhibitors: amlodipine   ? Concurrent nephrotoxic medications: None identified    Assessment/Plan:  Recommendedation(s)  ? Decrease to tacrolimus 1 mg twice daily    Follow-up  ? Tacrolimus levels daily with AM labs..   ? A pharmacist will continue to monitor and recommend levels as appropriate    Please page service pharmacist with questions/clarifications.    Crista Curb, PharmD

## 2019-01-16 NOTE — Unmapped (Signed)
Thoracic Surgery Consult Follow-up Note      Assessment/Plan:     Anthony Alvarado is a 58 y.o. y/o male with a history of orthotopic liver transplant who suffered from recurrent exudative pleural effusions. He is s/p open thoractomy R pulmonary decortication with pleurodesis on 01/13/19. He tolerated the procedure without incident and is recovering well. Chest tube with 40 out in last 24, no airleak, no PTX on CXR.    Recommendations:  ?? SRT will remove today and order post-pull CXR.  ?? If CXR is stable, thoracic will sign off.  ?? We would like to see the patient in thoracic clinic for follow up and to remove chest tube sutures in 2 weeks. We will request for an appointment to be made.      Interval History/Events  - Pain control is good  - Breathing feels easier but it's still hard to take a deep breath    Objective:     Physical Exam:  Temp:  [35.9 ??C-36.9 ??C] 35.9 ??C  Heart Rate:  [80-88] 80  Resp:  [16-18] 18  BP: (99-112)/(63-78) 99/72  MAP (mmHg):  [78-88] 82  FiO2 (%):  [2.5 %-25 %] 25 %  SpO2:  [92 %-100 %] 94 %  General: awake, alert and oriented x 3, looks mildly uncomfortable  Chest: Normal work of breathing, chest tube in place, 60cc serosanguinous output, no airleak, transitioned to waterseal  CVS: RRR  ABD: soft, NT, ND  Ext: warm and well perfused    Data Review:    All lab results last 24 hours:    Recent Results (from the past 24 hour(s))   CBC    Collection Time: 01/16/19  5:04 AM   Result Value Ref Range    WBC 13.0 (H) 4.5 - 11.0 10*9/L    RBC 3.61 (L) 4.50 - 5.90 10*12/L    HGB 9.1 (L) 13.5 - 17.5 g/dL    HCT 16.1 (L) 09.6 - 53.0 %    MCV 81.8 80.0 - 100.0 fL    MCH 25.1 (L) 26.0 - 34.0 pg    MCHC 30.7 (L) 31.0 - 37.0 g/dL    RDW 04.5 40.9 - 81.1 %    MPV 7.6 7.0 - 10.0 fL    Platelet 360 150 - 440 10*9/L   Magnesium Level    Collection Time: 01/16/19  5:04 AM   Result Value Ref Range    Magnesium 1.6 1.6 - 2.2 mg/dL   Phosphorus Level    Collection Time: 01/16/19  5:04 AM Result Value Ref Range    Phosphorus 4.3 2.9 - 4.7 mg/dL   Comprehensive Metabolic Panel    Collection Time: 01/16/19  5:04 AM   Result Value Ref Range    Sodium 134 (L) 135 - 145 mmol/L    Potassium 4.2 3.5 - 5.0 mmol/L    Chloride 94 (L) 98 - 107 mmol/L    Anion Gap 14 7 - 15 mmol/L    CO2 26.0 22.0 - 30.0 mmol/L    BUN 31 (H) 7 - 21 mg/dL    Creatinine 9.14 7.82 - 1.30 mg/dL    BUN/Creatinine Ratio 27     EGFR CKD-EPI Non-African American, Male 49 >=60 mL/min/1.59m2    EGFR CKD-EPI African American, Male 18 >=60 mL/min/1.32m2    Glucose 112 70 - 179 mg/dL    Calcium 9.1 8.5 - 95.6 mg/dL    Albumin 3.3 (L) 3.5 - 5.0 g/dL    Total Protein 5.6 (L) 6.5 -  8.3 g/dL    Total Bilirubin 0.5 0.0 - 1.2 mg/dL    AST 28 19 - 55 U/L    ALT 59 (H) <50 U/L    Alkaline Phosphatase 160 (H) 38 - 126 U/L       Imaging: Radiology studies were personally reviewed  CXR: persistent opacity in R LL, likely pulmonary congestion

## 2019-01-16 NOTE — Unmapped (Signed)
Pt a/ox4. VSS. Afebrile. Pt remain on PCA dilaudid for pain management. PRN oxycodone given for break through pain this evening. Right chest tube remain in place and to water seal. CPAP machine on during bed time. No falls/injuries noted. Continue to monitor pt care.   Problem: Adult Inpatient Plan of Care  Goal: Plan of Care Review  Outcome: Progressing  Goal: Patient-Specific Goal (Individualization)  Outcome: Progressing  Goal: Absence of Hospital-Acquired Illness or Injury  Outcome: Progressing  Goal: Optimal Comfort and Wellbeing  Outcome: Progressing  Goal: Readiness for Transition of Care  Outcome: Progressing  Goal: Rounds/Family Conference  Outcome: Progressing     Problem: Pain Acute  Goal: Optimal Pain Control  Outcome: Progressing     Problem: Noninvasive Ventilation Acute  Goal: Effective Unassisted Ventilation and Oxygenation  Outcome: Progressing     Problem: Obstructive Sleep Apnea Risk or Actual (Comorbidity Management)  Goal: Unobstructed Breathing During Sleep  Outcome: Progressing     Problem: Fall Injury Risk  Goal: Absence of Fall and Fall-Related Injury  Outcome: Progressing     Problem: Wound  Goal: Optimal Wound Healing  Outcome: Progressing     Problem: Skin Injury Risk Increased  Goal: Skin Health and Integrity  Outcome: Progressing     Problem: Self-Care Deficit  Goal: Improved Ability to Complete Activities of Daily Living  Outcome: Progressing

## 2019-01-16 NOTE — Unmapped (Signed)
SRF TEACHING ROUNDS    An interdisciplinary care conference was held today and included the following team members: Jacqulyn Ducking, MD Transplant Surgeon , Trilby Leaver, MD Transplant Surgery Fellow, Lisbeth Ply, MD Transplant Nephrologist, Dian Queen, MD Nephrology Fellow (Med/Ped), Drake Leach, [place] Transplant Physician Assistant, Cranston Neighbor, RN Transplant Nurse Coordinator, Salem Senate, RN, MSN Transplant Nurse Coordinator, Newton Pigg, RN, MSN  Transplant Nurse Coordinator, Carole Binning, LCSWA Transplant Case Manager, Justice Britain, LCSWA Transplant Case Manager and Odessa Fleming, Pharm D Pharmacist (covering).    Per team, thoracic team removed CT; MRI of brain today as patient complain of blurry vision and right ear numbness; pending results could discharge later today or tomorrow. No Oxygen needed at dischage     Discharge Plan: No needs identified at discharge       Carole Binning, LCSWA  Transplant Case Manager  [facility]

## 2019-01-16 NOTE — Unmapped (Signed)
Initial Consult Note        Requesting Attending Physician:  Lilyan Punt [facility]*  Service Requesting Consult: Surg Transplant [facility])     Assessment and Plan          Anthony Alvarado is a 58 y.o.  male on whom I have been asked by Leona Carry* to consult for acute onset of ear numbness and right sided blurry vision that has progressively improved. On initial evaluation exam was remarkable for decreased light touch and pinprick around the auricle, most specifically the helix and outer margin with a distribution of the great and lower auricular nerve, hyperactive reflexes though symmetric, babinski on the right and 2 beats clonus.   With regards to his numbness that continues to improve, this could be secondary to compression leading to damage of a peripheral nerve vs a distal mononeuropathy. Per our evaluation his eye exam was normal thus recommend ophthalmology evaluation for further assessment.   His incidental findings at this time are concerning for central pathology thus recommend evaluation with MRI brain and spine wwo contrast.     Recommendations:  -- Recommend obtaining  B12, TPA (treponema antibody), TSH and free T4 to rule out peripheral causes of neuropathy  -- Obtain MRI Brain WWO and C spine wwo, if you could only perform one procedure with contrast recommend obtaining Brain with contrast  -- Recommend obtaining ophthalmology evaluation for blurry vision     This patient was seen and discussed with Dr. Lucina Mellow who agrees with the above assessment and plan.  We will continue to follow, if any question please page Neurology at [phone]     Modena Slater??a, MD  PGY-II Neurology Resident       Attending Note Addendum I saw and evaluated the patient, participating in the key portions of the service. I reviewed the resident???s note. I agree with the resident???s findings and plan.  Unusual finding of numbness of the ear on the right side.  He describes having wandering sensory symptoms previously but these may have been in the context of migraine, starting in his hand and ending near his mouth.   Reflexes were noted to be hypereflexic bilateral in upper extremities and with a couple of beats of clonus in the feet bilaterally.  He is on opioid/ dilaudid medication as well as Gabapentin which might affect motor stability as these can cause intermittent jerking and there was some concern for dysmetria on prior examination.  His vision symptoms appear to be monocular with different time course than ear symptoms, and he might benefit from an ophthalmological evaluation.  For a work-up for peripheral nerve pathologies that might require specific treatment we recommended the labs above.  See further remainder of recommendations above for the findings of hyperrerflexia.  He is at some risk for brain white matter changes (PRES)  due to tacrolimus that sometimes present with vision changes.     Sanaia Jasso L. Lucina Mellow, MD       HPI        Reason for Consult: right ear numbness and blurry vision Anthony Alvarado is a 58 y.o.male with past medical history significant for HTN, DM2, history of hepatic encephalopathy, cryptogenic cirrhosis and hepatocellular carcinoma??s/p??OLT 09/16/2018, aortic valve stenosis and AI cholangitis on whom I have been asked by Leona Carry* to consult for right-sided blurry vision and ear numbness.  Patient reports he first noted the earn numbness on waking up.  He states he went to sleep without having any neurologic symptoms  and woke up this morning with right-sided ear numbness that has progressively improved as well as worsening of his baseline blurry vision.  Patient has never had an episode like this.  He reports a couple of years ago he had a onset left facial numbness and right-sided numbness that self resolved after a couple of seconds.  He has never had any other episodes like this.  He denies any other neurological symptoms.  He denies any headaches.   He denies any problems with mastication or tenderness on temporal areas.  He denies any pain around the ear.       Allergies   Allergen Reactions   ??? Watermelon Flavor      Mouth itching      Current Facility-Administered Medications   Medication Dose Route Frequency Provider Last Rate Last Dose   ???   EXPAREL ADMINISTERED WITHIN 96 HOURS - NO BUPIVACAINE FOR 96 HOURS AFTER EXPAREL  1 each Other Continuous Seymour Bars, MD       ??? acetaminophen (TYLENOL) tablet 650 mg  650 mg Oral Q4H PRN Seymour Bars, MD   650 mg at 01/11/19 1341   ??? amLODIPine (NORVASC) tablet 10 mg  10 mg Oral Daily Seymour Bars, MD   10 mg at 01/15/19 0901   ??? aspirin chewable tablet 81 mg  81 mg Oral Daily Seymour Bars, MD   81 mg at 01/16/19 0827   ??? calcium carbonate (TUMS) chewable tablet 200 mg elem calcium  200 mg elem calcium Oral TID PRN Seymour Bars, MD       ??? fluticasone propionate (FLONASE) 50 mcg/actuation nasal spray 1 spray  1 spray Each Nare Daily Seymour Bars, MD ??? furosemide (LASIX) tablet 40 mg  40 mg Oral BID Carolynn Sayers, MD   40 mg at 01/16/19 1426   ??? gabapentin (NEURONTIN) capsule 600 mg  600 mg Oral BID Carolynn Sayers, MD   600 mg at 01/16/19 1610   ??? heparin (porcine) injection 5,000 Units  5,000 Units Subcutaneous [facility] Seymour Bars, MD   5,000 Units at 01/16/19 1426   ??? HYDROmorphone (DILAUDID) 50mg /62ml (1mg /ml) PCA CADD   Intravenous Continuous Carolynn Sayers, MD       ??? HYDROmorphone (PF) (DILAUDID) injection 0.5 mg  0.5 mg Intravenous Q3H PRN Seymour Bars, MD       ??? labetaloL (NORMODYNE,TRANDATE) injection 10 mg  10 mg Intravenous Q4H PRN Drake Leach, [place]       ??? levothyroxine (SYNTHROID) tablet 137 mcg  137 mcg Oral daily Seymour Bars, MD   137 mcg at 01/16/19 0535   ??? mycophenolate (MYFORTIC) EC tablet 360 mg  360 mg Oral BID Seymour Bars, MD   360 mg at 01/16/19 9604   ??? naloxone (NARCAN) injection 0.4 mg  0.4 mg Intravenous Q5 Min PRN Seymour Bars, MD       ??? ondansetron (ZOFRAN-ODT) disintegrating tablet 4 mg  4 mg Oral Q6H PRN Seymour Bars, MD       ??? oxyCODONE (ROXICODONE) immediate release tablet 5 mg  5 mg Oral Q4H PRN Seymour Bars, MD   5 mg at 01/13/19 2005    Or   ??? oxyCODONE (ROXICODONE) immediate release tablet 10 mg  10 mg Oral Q4H PRN Seymour Bars, MD   10 mg at 01/15/19 2228   ??? sertraline (ZOLOFT) tablet 25 mg  25 mg Oral Daily Seymour Bars, MD  25 mg at 01/16/19 1610   ??? tacrolimus (PROGRAF) capsule 1 mg  1 mg Oral BID Drake Leach, [place]         Facility-Administered Medications Ordered in Other Encounters   Medication Dose Route Frequency Provider Last Rate Last Dose   ??? diazePAM (VALIUM) 5 mg/mL injection                Past Medical History:   Diagnosis Date   ??? Aortic valve stenosis    ??? Autoimmune cholangitis    ??? 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 Cholestatic cirrhosis (CMS-HCC) ??? Hepatic encephalopathy (CMS-HCC)    ??? Hypertension    ??? Sleep apnea    ??? Type 2 diabetes mellitus (CMS-HCC)        Past Surgical History:   Procedure Laterality Date   ??? APPENDECTOMY     ??? CHG US GUIDE, TISSUE ABLATION N/A 05/03/2018    Procedure: ULTRASOUND GUIDANCE FOR, AND MONITORING OF, PARENCHYMAL TISSUE ABLATION;  Surgeon: Particia Nearing, MD;  Location[facility] OR [facility];  Service: Transplant   ??? PR CATH PLACE/CORON ANGIO, IMG SUPER/INTERP,R&L HRT CATH, L HRT VENTRIC N/A 03/21/2018    Procedure: Left/Right Heart Catheterization;  Surgeon: Neal Dy, MD;  Location: [facility] CATH;  Service: Cardiology   ??? PR DECORTICATION,PULMONARY,TOTAL Right 01/13/2019    Procedure: Decortic Pulm (Separt Proc); Tot;  Surgeon: Evert Kohl, MD;  Location: MAIN OR [facility];  Service: Thoracic   ??? PR EGD FLEXIBLE FOREIGN BODY REMOVAL N/A 12/26/2018    Procedure: UGI ENDOSCOPY; W/REMOVAL FOREIGN BODY;  Surgeon: Vonda Antigua, MD;  [facility];  Service: Gastroenterology   ??? PR LAP,ABLAT 1+ LIVER TUMOR(S),RADIOFREQ N/A 05/03/2018    Procedure: LAPAROSCOPY, SURGICAL, ABLATION OF 1 OR MORE LIVER TUMOR(S); RADIOFREQUENCY;  Surgeon: Particia Nearing, MD;  Location: MA[facility];  Service: Transplant   ??? PR PERQ DRAINAGE PLEURA INSERT CATH W/IMAGING N/A 01/11/2019    Procedure: PLEURAL DRAINAGE, PERC, W INSERTION OF INDWELLING CATHETER; W IMAGING GUIDANCE;  Surgeon: Jerelyn Charles, MD;  Location: BRONCH PROCEDURE LAB [facility];  Service: Pulmonary   ??? PR THORACENTESIS NEEDLE/CATH PLEURA W/IMAGING N/A 01/06/2019    Procedure: THORACENTESIS W/ IMAGING;  Surgeon: Cleora Fleet, MD;  Location: BRONCH PROCEDURE LAB [facility];  Service: Pulmonary   ??? PR THORACOSCOPY SURG W/PLEURODESIS Right 01/13/2019    Procedure: THORACOSCOPY, SURGICAL; WITH PLEURODESIS (EG, MECHANICAL OR CHEMICAL);  Surgeon: Evert Kohl, MD;  Location: MAIN OR [facility];  Service: Thoracic ??? PR TRANSPLANT LIVER,ALLOTRANSPLANT Bilateral 09/16/2018    Procedure: LIVER ALLOTRANSPLANTATION; ORTHOTOPIC, PARTIAL OR WHOLE, FROM CADAVER OR LIVING DONOR, ANY AGE;  Surgeon: Florene Glen, MD;  Location: [facility];  Service: Transplant   ??? PR TRANSPLANT,PREP DONOR LIVER, WHOLE N/A 09/16/2018    Procedure: [facility] STD PREP CAD DONOR WHOLE LIVER GFT PRIOR TNSPLNT,INC CHOLE,DISS/REM SURR TISSU WO TRISEG/LOBE SPLT;  Surgeon: Florene Glen, MD;  Location: MAIN OR [facility];  Service: Transplant       Social History     Socioeconomic History   ??? Marital status: Legally Separated     Spouse name: None   ??? Number of children: None   ??? Years of education: None   ??? Highest education level: None   Occupational History   ??? None   Social Needs   ??? Financial resource strain: None   ??? Food insecurity     Worry: None  Inability: None   ??? Transportation needs     Medical: None     Non-medical: None   Tobacco Use   ??? Smoking status: Never Smoker   ??? Smokeless tobacco: Never Used   Substance and Sexual Activity   ??? Alcohol use: Not Currently   ??? Drug use: Never   ??? Sexual activity: None   Lifestyle   ??? Physical activity     Days per week: None     Minutes per session: None   ??? Stress: None   Relationships   ??? Social Wellsite geologist on phone: None     Gets together: None     Attends religious service: None     Active member of club or organization: None     Attends meetings of clubs or organizations: None     Relationship status: None   Other Topics Concern   ??? None   Social History Narrative   ??? None       Family History   Problem Relation Age of Onset   ??? Asthma Mother    ??? COPD Father    ??? Cancer Father    ??? Autoimmune disease Sister         AIH   ??? Heart disease Sister    ??? Colon cancer Maternal Grandfather        Code Status: Full Code     Review of Systems     A 10-system review of systems was conducted and was negative except as documented above in the HPI.       Objective Temp:  [35.9 ??C-36.9 ??C] 36.2 ??C  Heart Rate:  [80-88] 83  Resp:  [16-18] 18  BP: (99-112)/(61-78) 108/61  MAP (mmHg):  [78-88] 81  FiO2 (%):  [2.5 %-25 %] 25 %  SpO2:  [93 %-100 %] 94 %  I/O this shift:  In: 600 [P.O.:600]  Out: 1200 [Urine:1200]    Physical Exam:  General Appearance:Well appearing. In no acute distress.  HEENT: Head is atraumatic and normocephalic. Sclera anicteric without injection. Oropharyngeal membranes are moist with no erythema or exudate.  Neck: Supple.  Lungs: Normal work of breathing  Heart: Regular rate and rhythm.  Abdomen: Soft, nontender, nondistended.  Extremities: No clubbing, cyanosis, or edema.    Neurological Examination:     Mental Status: Alert, conversant, able to follow conversation and interview. Spontaneous speech was fluent without word finding pauses, dysarthria, or paraphasic errors. Comprehension was intact to simple and multi-step commands. Memory for recent and remote events was intact.    Cranial Nerves: PERRL 1 mm. Pursuit eye movements were uninterrupted with full range and without more than end-gaze nystagmus. Facial sensation intact bilaterally to light touch on the forehead, cheek, and chin. Face symmetric at rest. Normal facial movement bilaterally, including forehead, eye closure and grimace/smile. Hearing intact to conversation. Sternoclidomastoid R/L: 4/5/5/5. Palate movement is symmetric. Tongue protrudes midline and tongue movements are normal.    Motor Exam: Normal bulk.  No tremors, myoclonus, or other adventitious movement.  UE R/L: deltoid 4+/5, biceps 4+/5, triceps 5/5, brachioradialis 5/5 and hand grip strong/strong.  LE R/L: quadriceps 5/5, hamstrings 5/5, dorsiflexion 5/5 and plantar flexion 5/5.      Reflexes:   R L   Biceps +3 +3   Triceps +3 +3   Patella +3 +3   Achilles +2 +2   Suprapatellar reflexes on exam with cross adductor  Toes are equivocal on the right. babinsky noted  on the left. No Hoffman's present bilaterally.  Two beats clonus BL Sensory: Sensation normal to light touch, pinprick and temperature sensation to cold in both hands and both feet and to vibration distally in the fingers and toes.   FTN on the Right without dysmetria or ataxia. Some ataxia and off-shooting noted on the left    Cerebellar/Coordination/Gait: Heel-to-shin is normal without ataxia or dysmetria bilaterally.            Diagnostic Studies      Risk Stratification:   Cholesterol (mg/dL)   Date Value   16/11/9602 114     Triglycerides (mg/dL)   Date Value   54/10/8117 243 (H)     HDL (mg/dL)   Date Value   14/78/2956 33 (L)     LDL Calculated (mg/dL)   Date Value   21/30/8657 32 (L)     TSH (uIU/mL)   Date Value   12/19/2018 1.410     Hemoglobin A1C (%)   Date Value   12/19/2018 5.9 (H)       No images available

## 2019-01-16 NOTE — Unmapped (Signed)
Transplant Surgery Progress Note    Hospital Day: 12    Assessment & Plan:   Patient is a 58 y.o. male with cryptogenic cirrhosis and hepatocellular carcinoma s/p OLT 09/16/2018, aortic valve stenosis, type 2 diabetes, autoimmune cholangitis with recent drainage of intra-abdominal fluid collection by VIR, admitted recently 11/2-11/4 for thoracentesis with Interventional Pulmonology on 11/3. CXR from this morning shows recurrance of right sided pleural effusion now status post thoracentesis on 11/13.  ??  - Regular diet, ML  **Pleaural Effusion   - Thoracentesis with IP 11/13: F/u fluid cytology/cultures/fluid analysis   - CT chest 11/16: Interval worsening of large right pleural effusion with right middle and lower lobe collapse   -chest tube placement with IP 11/18   -post chest tube cxr with R basilar PTX, loculations   -SRT consulted, now s/p pulmonary decortication with pleurodesis 11/20  -CT to water seal   - Appreciate ICID recommendations. Additional labs ordered. Ivermectin given.  -improved UOP overnight, did not require additional Albumin (albumin 25g 25% x2 11/21)  -Lasix 20mg  BID  -New onset R auricular tingling/numbess and R blurry vision, given unusual combination of neurologic sxs, neurology consulted, recs appreciated  - Resume home medication including immunosuppression and home lasix  - OOB, IS  - Home CPAP  - SQH  ??   If you have any questions, concerns or changes in the patient's clinical status, please page the [facility] floor pager. [phone]    Interval Events:     NAEON. Afebrile, VSS. Patient saturating well on room air, pain well controlled. Worsening RUL opacities on AM cxr. New onset R auricular tingling and numbness with accompanying Right eye blurriness, neurology consulted.     Objective:        Vital Signs:  BP 99/72  - Pulse 80  - Temp 35.9 ??C (Oral)  - Resp 18  - Ht 172.7 cm (5' 8)  - Wt 81.4 kg (179 lb 7.3 oz)  - SpO2 94%  - BMI 27.29 kg/m??     Input/Output:  I/O last 3 completed shifts: In: 1080 [P.O.:1080]  Out: 3335 [Urine:3235; Chest Tube:100]    Physical Exam:  General: Cooperative, no distress, well appearing   Pulmonary: Normal work of breathing, equal bilateral chest rise. Right CT in place, no air leak noted.  Cardiac: Regular rate, HDS  Abdomen: Soft, non-distended, non-tender. Well healed mercedes incision from   Skin: No jaundice, rashes, erythema, or lesions. Skin color and texture normal.  Neurologic: Alert and interactive, no motor abnormalities noted.  CN intact, numbness of right ear and periauricular area as well as blurry vision in R eye, all four fields, noted.       Labs:  Reviewed    Imaging:  All pertinent imaging personally reviewed.

## 2019-01-16 NOTE — Unmapped (Signed)
Patient continued on CPAP +6 / 25% this shift without event. No other changes or respiratory interventions made at this time. Will continue to monitor the patient's respiratory status.     Problem: Noninvasive Ventilation Acute  Goal: Effective Unassisted Ventilation and Oxygenation  Outcome: Ongoing - Unchanged

## 2019-01-17 DIAGNOSIS — Z944 Liver transplant status: Principal | ICD-10-CM

## 2019-01-17 DIAGNOSIS — E612 Magnesium deficiency: Principal | ICD-10-CM

## 2019-01-17 DIAGNOSIS — Z5181 Encounter for therapeutic drug level monitoring: Principal | ICD-10-CM

## 2019-01-17 LAB — T4, FREE: FREE T4: 1.35 ng/dL (ref 0.71–1.40)

## 2019-01-17 LAB — CBC
HEMATOCRIT: 28.5 % — ABNORMAL LOW (ref 41.0–53.0)
HEMOGLOBIN: 8.9 g/dL — ABNORMAL LOW (ref 13.5–17.5)
MEAN CORPUSCULAR HEMOGLOBIN CONC: 31.4 g/dL (ref 31.0–37.0)
MEAN CORPUSCULAR HEMOGLOBIN: 25.5 pg — ABNORMAL LOW (ref 26.0–34.0)
MEAN PLATELET VOLUME: 7.5 fL (ref 7.0–10.0)
PLATELET COUNT: 340 10*9/L (ref 150–440)
RED BLOOD CELL COUNT: 3.51 10*12/L — ABNORMAL LOW (ref 4.50–5.90)
RED CELL DISTRIBUTION WIDTH: 14.2 % (ref 12.0–15.0)
WBC ADJUSTED: 11.4 10*9/L — ABNORMAL HIGH (ref 4.5–11.0)

## 2019-01-17 LAB — COMPREHENSIVE METABOLIC PANEL
ALBUMIN: 3.1 g/dL — ABNORMAL LOW (ref 3.5–5.0)
ALKALINE PHOSPHATASE: 205 U/L — ABNORMAL HIGH (ref 38–126)
ALT (SGPT): 61 U/L — ABNORMAL HIGH (ref <50)
ANION GAP: 7 mmol/L (ref 7–15)
AST (SGOT): 37 U/L (ref 19–55)
BILIRUBIN TOTAL: 0.5 mg/dL (ref 0.0–1.2)
BLOOD UREA NITROGEN: 29 mg/dL — ABNORMAL HIGH (ref 7–21)
BUN / CREAT RATIO: 27
CALCIUM: 8.7 mg/dL (ref 8.5–10.2)
CHLORIDE: 96 mmol/L — ABNORMAL LOW (ref 98–107)
CO2: 29 mmol/L (ref 22.0–30.0)
CREATININE: 1.08 mg/dL (ref 0.70–1.30)
EGFR CKD-EPI AA MALE: 88 mL/min/{1.73_m2} (ref >=60)
EGFR CKD-EPI NON-AA MALE: 76 mL/min/{1.73_m2} (ref >=60)
GLUCOSE RANDOM: 135 mg/dL (ref 70–179)
POTASSIUM: 4.3 mmol/L (ref 3.5–5.0)
SODIUM: 132 mmol/L — ABNORMAL LOW (ref 135–145)

## 2019-01-17 LAB — THYROID STIMULATING HORMONE: Thyrotropin:ACnc:Pt:Ser/Plas:Qn:: 2.972

## 2019-01-17 LAB — PHOSPHORUS: Phosphate:MCnc:Pt:Ser/Plas:Qn:: 4

## 2019-01-17 LAB — AST (SGOT): Aspartate aminotransferase:CCnc:Pt:Ser/Plas:Qn:: 37

## 2019-01-17 LAB — FREE T4: Thyroxine.free:MCnc:Pt:Ser/Plas:Qn:: 1.35

## 2019-01-17 LAB — TACROLIMUS BLOOD: Lab: 6.6

## 2019-01-17 LAB — MEAN CORPUSCULAR VOLUME: Lab: 81.2

## 2019-01-17 LAB — VITAMIN B-12: Cobalamins:MCnc:Pt:Ser/Plas:Qn:: 876

## 2019-01-17 LAB — MAGNESIUM: Magnesium:MCnc:Pt:Ser/Plas:Qn:: 1.7

## 2019-01-17 MED ORDER — POLYETHYLENE GLYCOL 3350 17 G PO PACK
17.00 | PACK | ORAL | Status: DC
Start: 2019-01-17 — End: 2019-01-17

## 2019-01-17 MED ORDER — HYDROXYZINE HCL 25 MG PO TABS
25.00 | ORAL_TABLET | ORAL | Status: DC
Start: ? — End: 2019-01-17

## 2019-01-17 MED ORDER — LABETALOL HCL 5 MG/ML IV SOLN
10.00 | INTRAVENOUS | Status: DC
Start: ? — End: 2019-01-17

## 2019-01-17 MED ORDER — GENERIC EXTERNAL MEDICATION
137.00 | Status: DC
Start: 2019-01-18 — End: 2019-01-17

## 2019-01-17 MED ORDER — ONDANSETRON 4 MG PO TBDP
4.00 | ORAL_TABLET | ORAL | Status: DC
Start: ? — End: 2019-01-17

## 2019-01-17 MED ORDER — FUROSEMIDE 40 MG PO TABS
40.00 | ORAL_TABLET | ORAL | Status: DC
Start: 2019-01-18 — End: 2019-01-17

## 2019-01-17 MED ORDER — MYCOPHENOLATE SODIUM 360 MG PO TBEC
360.00 | DELAYED_RELEASE_TABLET | ORAL | Status: DC
Start: 2019-01-17 — End: 2019-01-17

## 2019-01-17 MED ORDER — ASPIRIN 81 MG PO CHEW
81.00 | CHEWABLE_TABLET | ORAL | Status: DC
Start: 2019-01-18 — End: 2019-01-17

## 2019-01-17 MED ORDER — HEPARIN SODIUM (PORCINE) 5000 UNIT/ML IJ SOLN
5000.00 | INTRAMUSCULAR | Status: DC
Start: 2019-01-17 — End: 2019-01-17

## 2019-01-17 MED ORDER — GENERIC EXTERNAL MEDICATION
Status: DC
Start: ? — End: 2019-01-17

## 2019-01-17 MED ORDER — SERTRALINE HCL 25 MG PO TABS
25.00 | ORAL_TABLET | ORAL | Status: DC
Start: 2019-01-18 — End: 2019-01-17

## 2019-01-17 MED ORDER — DOCUSATE SODIUM 100 MG PO CAPS
100.00 | ORAL_CAPSULE | ORAL | Status: DC
Start: ? — End: 2019-01-17

## 2019-01-17 MED ORDER — FLUTICASONE PROPIONATE 50 MCG/ACT NA SUSP
1.00 | NASAL | Status: DC
Start: 2019-01-18 — End: 2019-01-17

## 2019-01-17 MED ORDER — ACETAMINOPHEN 325 MG PO TABS
650.00 | ORAL_TABLET | ORAL | Status: DC
Start: ? — End: 2019-01-17

## 2019-01-17 MED ORDER — HYDROMORPHONE HCL 1 MG/ML IJ SOLN
0.50 | INTRAMUSCULAR | Status: DC
Start: ? — End: 2019-01-17

## 2019-01-17 MED ORDER — NALOXONE HCL 0.4 MG/ML IJ SOLN
0.40 | INTRAMUSCULAR | Status: DC
Start: ? — End: 2019-01-17

## 2019-01-17 MED ORDER — GABAPENTIN 300 MG PO CAPS
600.00 | ORAL_CAPSULE | ORAL | Status: DC
Start: 2019-01-17 — End: 2019-01-17

## 2019-01-17 MED ORDER — AMLODIPINE BESYLATE 10 MG PO TABS
10.00 | ORAL_TABLET | ORAL | Status: DC
Start: 2019-01-18 — End: 2019-01-17

## 2019-01-17 MED ORDER — TACROLIMUS 1 MG PO CAPS
1.00 | ORAL_CAPSULE | ORAL | Status: DC
Start: 2019-01-17 — End: 2019-01-17

## 2019-01-17 MED ORDER — POLYETHYLENE GLYCOL 3350 17 GRAM/DOSE ORAL POWDER
Freq: Every day | ORAL | 0 refills | 30 days | Status: CP | PRN
Start: 2019-01-17 — End: 2019-02-16
  Filled 2019-01-17: qty 510, 30d supply, fill #0

## 2019-01-17 MED ORDER — DOCUSATE SODIUM 100 MG CAPSULE
ORAL_CAPSULE | Freq: Two times a day (BID) | ORAL | 0 refills | 30 days | Status: CP | PRN
Start: 2019-01-17 — End: ?
  Filled 2019-01-17: qty 60, 30d supply, fill #0

## 2019-01-17 MED ORDER — OXYCODONE 5 MG TABLET
ORAL_TABLET | Freq: Four times a day (QID) | ORAL | 0 refills | 8 days | Status: CP | PRN
Start: 2019-01-17 — End: 2019-01-25
  Filled 2019-01-17: qty 60, 8d supply, fill #0

## 2019-01-17 MED ORDER — FUROSEMIDE 20 MG TABLET
ORAL_TABLET | Freq: Every day | ORAL | 11 refills | 30 days | Status: CP
Start: 2019-01-17 — End: 2020-01-17
  Filled 2019-03-08: qty 60, 30d supply, fill #0

## 2019-01-17 MED ORDER — GABAPENTIN 300 MG CAPSULE
ORAL_CAPSULE | Freq: Two times a day (BID) | ORAL | 11 refills | 30 days | Status: CP
Start: 2019-01-17 — End: 2020-01-17

## 2019-01-17 MED ORDER — TACROLIMUS 0.5 MG CAPSULE
ORAL_CAPSULE | Freq: Two times a day (BID) | ORAL | 11 refills | 45 days
Start: 2019-01-17 — End: ?

## 2019-01-17 MED FILL — DOK 100 MG CAPSULE: 30 days supply | Qty: 60 | Fill #0 | Status: AC

## 2019-01-17 MED FILL — OXYCODONE 5 MG TABLET: 8 days supply | Qty: 60 | Fill #0 | Status: AC

## 2019-01-17 MED FILL — POLYETHYLENE GLYCOL 3350 17 GRAM/DOSE ORAL POWDER: 30 days supply | Qty: 510 | Fill #0 | Status: AC

## 2019-01-17 NOTE — Unmapped (Signed)
SRF TEACHING ROUNDS    An interdisciplinary care conference was held today and included the following team members: Jacqulyn Ducking, MD Transplant Surgeon , SRF Surgery Resident , Lisbeth Ply, MD Transplant Nephrologist, Drake Leach, [place] Transplant Physician Assistant, Salem Senate, RN, MSN Transplant Nurse Coordinator, Newton Pigg, RN, MSN  Transplant Nurse Coordinator, Justice Britain, LCSWA Transplant Case Manager and [place] Barkes, Pharm D Pharmacist.    Per team, Appreciate neurology recs-MRI brain/spine today;     Discharge Plan: today pending MRI results; f/u appt with thoracic 12/8    Caryl Ada Inpatient Transplant Nurse Coordinator 01/17/2019 11:54 AM

## 2019-01-17 NOTE — Unmapped (Signed)
Reviewed POC with pt. VSS. Afebrile. Voiding adequate urine output. No BM this shift. Tolerating regular diet without report of nausea/vomitting. Pain controlled with dilaudid PCA. Pt continues to c/o right ear numbness. Neuro consult today. Chest tube removed this shift. No pt questions/concerns at this time.       Problem: Adult Inpatient Plan of Care  Goal: Plan of Care Review  Outcome: Progressing  Goal: Patient-Specific Goal (Individualization)  Outcome: Progressing  Goal: Absence of Hospital-Acquired Illness or Injury  Outcome: Progressing  Goal: Optimal Comfort and Wellbeing  Outcome: Progressing  Goal: Readiness for Transition of Care  Outcome: Progressing  Goal: Rounds/Family Conference  Outcome: Progressing     Problem: Pain Acute  Goal: Optimal Pain Control  Outcome: Progressing     Problem: Noninvasive Ventilation Acute  Goal: Effective Unassisted Ventilation and Oxygenation  Outcome: Progressing     Problem: Obstructive Sleep Apnea Risk or Actual (Comorbidity Management)  Goal: Unobstructed Breathing During Sleep  Outcome: Progressing     Problem: Fall Injury Risk  Goal: Absence of Fall and Fall-Related Injury  Outcome: Progressing     Problem: Wound  Goal: Optimal Wound Healing  Outcome: Progressing     Problem: Skin Injury Risk Increased  Goal: Skin Health and Integrity  Outcome: Progressing     Problem: Self-Care Deficit  Goal: Improved Ability to Complete Activities of Daily Living  Outcome: Progressing

## 2019-01-17 NOTE — Unmapped (Signed)
Pt is A&O x4 and verbalizes understanding of POC.  Pt OOB independently this shift, no falls.  Tolerating regular diet, no complaints of n/v.  Wound dressing over old chest tube site C/D/I with no visible drainage.  Pain managed well with Dilaudid PCA and PRN meds.  Pt reports not having bowel movement in 3 days, MD made aware, pt starting bowel regimen today.  Pt compliant with CPAP overnight.  VSS, no BM this shift, adequate UOP, no complaints or concerns.  Will continue to monitor.    Problem: Adult Inpatient Plan of Care  Goal: Plan of Care Review  Outcome: Progressing  Goal: Patient-Specific Goal (Individualization)  Outcome: Progressing  Goal: Absence of Hospital-Acquired Illness or Injury  Outcome: Progressing  Goal: Optimal Comfort and Wellbeing  Outcome: Progressing  Goal: Readiness for Transition of Care  Outcome: Progressing  Goal: Rounds/Family Conference  Outcome: Progressing     Problem: Pain Acute  Goal: Optimal Pain Control  Outcome: Progressing     Problem: Noninvasive Ventilation Acute  Goal: Effective Unassisted Ventilation and Oxygenation  Outcome: Progressing     Problem: Obstructive Sleep Apnea Risk or Actual (Comorbidity Management)  Goal: Unobstructed Breathing During Sleep  Outcome: Progressing     Problem: Fall Injury Risk  Goal: Absence of Fall and Fall-Related Injury  Outcome: Progressing     Problem: Wound  Goal: Optimal Wound Healing  Outcome: Progressing     Problem: Skin Injury Risk Increased  Goal: Skin Health and Integrity  Outcome: Progressing     Problem: Self-Care Deficit  Goal: Improved Ability to Complete Activities of Daily Living  Outcome: Progressing

## 2019-01-17 NOTE — Unmapped (Signed)
NPPV at bedside this shift for patient use. No other changes or respiratory interventions made at this time. Will continue to monitor the patient's respiratory status.     Problem: Noninvasive Ventilation Acute  Goal: Effective Unassisted Ventilation and Oxygenation  Outcome: Ongoing - Unchanged

## 2019-01-17 NOTE — Unmapped (Signed)
Follow-up Consult Note        Requesting Attending Physician:  Lilyan Punt [facility]*  Service Requesting Consult: Surg Transplant [facility])     Assessment and Plan          Anthony Alvarado is a 58 y.o. male with pmhx significant HTN, DM2, hx of hepatic encephalopathy, cryptogenic cirrhosis and hepatocellular carcinoma. Overnight, his periauricular tingling almost resolved. He has a scheduled appointment with ophthalmology in the outpatient setting. On further questioning, he reported wearing a CPAP mask with straps around his ears. Based on the placement of the straps we believe this could've affected his right ear. With regards to his abnormal findings on exam, his MRI Brain and C-spine did not show any abnormality with normal TSH, T4 and B12  thus not requiring any further evaluation. Patient does not require Neurology follow up as outpatient.    RECOMMENDATIONS:   1. Recommend follow up with Ophthalmology as an outpatient.     This patient was seen and discussed with Dr. Lucina Mellow who agrees with the above assessment and plan.  We will sign off at this time. If you have any further question and concern please page [phone]    Modena Slater??a, MD  PGY-II Neurology Resident     Attending Note Addendum    I saw and evaluated the patient, participating in the key portions of the service. I reviewed the resident???s note. I agree with the resident???s findings and plan.  Brain MRI personally reviewed and unremarkable.  Cervical with spine some mild degenerative disease. On further discussion today, it is possible that the periauricular numbness is due to pressure effects on nerve from CPAP mask strap which crosses his ear.  Serum labs are still indicated to assess for factors that might make peripheral nerves more susceptible to pressure causing dysfunction.    Meah Jiron L. Lucina Mellow, MD       Subjective        Reason for Consult: periauricular tingling and blurry vision Reports improvement in numbness to almost resolution. Reported he slept the night before with his CPAP machine and straps.     Blurry vision a little better from prior.          Objective        Temp:  [36.1 ??C-37.5 ??C] 36.3 ??C  Heart Rate:  [82-94] 82  Resp:  [16-19] 19  BP: (105-133)/(61-77) 110/75  MAP (mmHg):  [87-94] 89  SpO2:  [93 %-96 %] 93 %  I/O this shift:  In: 80 [P.O.:80]  Out: 430 [Urine:430]    Physical Exam:  General Appearance:Well appearing. In no acute distress.  HEENT: Head is atraumatic and normocephalic. Sclera anicteric without injection. Oropharyngeal membranes are moist with no erythema or exudate.  Neck: Supple.  Lungs: Normal work of breathing  Heart: Regular rate and rhythm.  Abdomen: Soft, nontender, nondistended.  Extremities: No clubbing, cyanosis, or edema.  ??  Neurological Examination:   ??  Mental Status: Alert, conversant, able to follow conversation and interview. Spontaneous speech was fluent without word finding pauses, dysarthria, or paraphasic errors. Comprehension was intact to simple and multi-step commands. Memory for recent and remote events was intact.  ??  Cranial Nerves: PERRL 1 mm. Pursuit eye movements were uninterrupted with full range and without more than end-gaze nystagmus. Facial sensation intact bilaterally to light touch on the forehead, cheek, and chin. Face symmetric at rest. Normal facial movement bilaterally, including forehead, eye closure and grimace/smile. Hearing intact to conversation. Sternoclidomastoid R/L: 4/5/5/5. Palate  movement is symmetric. Tongue protrudes midline and tongue movements are normal.  ??  Motor Exam: Normal bulk.  No tremors, myoclonus, or other adventitious movement.  UE R/L: deltoid 4+/5, biceps 4+/5, triceps 5/5, brachioradialis 5/5 and hand grip strong/strong.  LE R/L: quadriceps 5/5, hamstrings 5/5, dorsiflexion 5/5 and plantar flexion 5/5.  ??  ??  Reflexes:  ?? R L   Biceps +3 +3   Triceps +3 +3   Patella +3 +3   Achilles +2 +2 Suprapatellar reflexes on exam with cross adductor  Toes are equivocal on the right. babinsky noted on the left. No Hoffman's present bilaterally.  Two beats clonus BL  ??  Sensory: Sensation normal to light touch, pinprick and temperature sensation to cold in both hands and both feet and to vibration distally in the fingers and toes.   FTN on the Right without dysmetria or ataxia. Some ataxia and off-shooting noted on the left  ??  Cerebellar/Coordination/Gait: Heel-to-shin is normal without ataxia or dysmetria bilaterally.      Medications:  Scheduled medications:   ??? amLODIPine  10 mg Oral Daily   ??? aspirin  81 mg Oral Daily   ??? docusate sodium  100 mg Oral BID   ??? fluticasone propionate  1 spray Each Nare Daily   ??? furosemide  40 mg Oral BID   ??? gabapentin  600 mg Oral BID   ??? heparin (porcine) for subcutaneous use  5,000 Units Subcutaneous [facility]   ??? levothyroxine  137 mcg Oral daily   ??? mycophenolate  360 mg Oral BID   ??? polyethylen glycol  17 g Oral BID   ??? sertraline  25 mg Oral Daily   ??? tacrolimus  1 mg Oral BID     Continuous infusions:   PRN medications: acetaminophen, calcium carbonate, HYDROmorphone, hydrOXYzine, labetaloL, naloxone, ondansetron, oxyCODONE **OR** oxyCODONE     Diagnostic Studies      All Labs Last 24hrs:   Recent Results (from the past 24 hour(s))   CBC    Collection Time: 01/17/19  5:03 AM   Result Value Ref Range    WBC 11.4 (H) 4.5 - 11.0 10*9/L    RBC 3.51 (L) 4.50 - 5.90 10*12/L    HGB 8.9 (L) 13.5 - 17.5 g/dL    HCT 57.8 (L) 46.9 - 53.0 %    MCV 81.2 80.0 - 100.0 fL    MCH 25.5 (L) 26.0 - 34.0 pg    MCHC 31.4 31.0 - 37.0 g/dL    RDW 62.9 52.8 - 41.3 %    MPV 7.5 7.0 - 10.0 fL    Platelet 340 150 - 440 10*9/L   Magnesium Level    Collection Time: 01/17/19  5:03 AM   Result Value Ref Range    Magnesium 1.7 1.6 - 2.2 mg/dL   Phosphorus Level    Collection Time: 01/17/19  5:03 AM   Result Value Ref Range    Phosphorus 4.0 2.9 - 4.7 mg/dL   Tacrolimus Level, Timed Collection Time: 01/17/19  5:03 AM   Result Value Ref Range    Tacrolimus, Timed 6.6 ng/mL   Comprehensive Metabolic Panel    Collection Time: 01/17/19  5:03 AM   Result Value Ref Range    Sodium 132 (L) 135 - 145 mmol/L    Potassium 4.3 3.5 - 5.0 mmol/L    Chloride 96 (L) 98 - 107 mmol/L    Anion Gap 7 7 - 15 mmol/L    CO2 29.0  22.0 - 30.0 mmol/L    BUN 29 (H) 7 - 21 mg/dL    Creatinine 8.65 7.84 - 1.30 mg/dL    BUN/Creatinine Ratio 27     EGFR CKD-EPI Non-African American, Male 63 >=60 mL/min/1.74m2    EGFR CKD-EPI African American, Male 53 >=60 mL/min/1.86m2    Glucose 135 70 - 179 mg/dL    Calcium 8.7 8.5 - 69.6 mg/dL    Albumin 3.1 (L) 3.5 - 5.0 g/dL    Total Protein 5.3 (L) 6.5 - 8.3 g/dL    Total Bilirubin 0.5 0.0 - 1.2 mg/dL    AST 37 19 - 55 U/L    ALT 61 (H) <50 U/L    Alkaline Phosphatase 205 (H) 38 - 126 U/L   TSH    Collection Time: 01/17/19  5:03 AM   Result Value Ref Range    TSH 2.972 0.600 - 3.300 uIU/mL   T4, Free    Collection Time: 01/17/19  5:03 AM   Result Value Ref Range    Free T4 1.35 0.71 - 1.40 ng/dL       MRI Brain and Cervical Spine wwo contrast, personally reviewed:  IMPRESSION:  Sequela of chronic microvascular ischemic changes. No evidence of acute infarct.  ??  Limited examination secondary to motion artifact.          IMPRESSION:  Suboptimal examination due to diffuse motion artifact.  - No acute cervical spine abnormality.  - Multilevel degenerative changes of the cervical spine with varying degrees of canal stenosis (moderate C4-C5 and C5-C6) and foraminal narrowing, as detailed above.

## 2019-01-17 NOTE — Unmapped (Signed)
Transplant Surgery Progress Note    Hospital Day: 13    Assessment & Plan:   Patient is a 58 y.o. male with cryptogenic cirrhosis and hepatocellular carcinoma s/p OLT 09/16/2018, aortic valve stenosis, type 2 diabetes, autoimmune cholangitis with recent drainage of intra-abdominal fluid collection by VIR, admitted recently 11/2-11/4 for thoracentesis with Interventional Pulmonology on 11/3. CXR from this morning shows recurrance of right sided pleural effusion now status post thoracentesis on 11/13.  ??  - Regular diet, ML  **Pleaural Effusion   - Thoracentesis with IP 11/13: F/u fluid cytology/cultures/fluid analysis   - CT chest 11/16: Interval worsening of large right pleural effusion with right middle and lower lobe collapse   -chest tube placement with IP 11/18   -post chest tube cxr with R basilar PTX, loculations   -SRT consulted, now s/p pulmonary decortication with pleurodesis 11/20  -CT removed 11/23, post pull CXR WNL  - Appreciate ICID recommendations. Additional labs ordered. Ivermectin given.  -improved UOP overnight, did not require additional Albumin (albumin 25g 25% x2 11/21)  -Lasix 40mg  BID  -New onset R auricular tingling/numbess and R blurry vision, given unusual combination of neurologic sxs, neurology consulted, recs appreciated  --MRI Brain and Spine WWO contrast 11/24, results pending  - Resume home medication including immunosuppression  - OOB, IS  - Home CPAP  -Transition to PO pain management  - SQH  ??   If you have any questions, concerns or changes in the patient's clinical status, please page the [facility] floor pager. [phone]    Interval Events:     NAEON. Afebrile, VSS. Patient saturating well on room air, pain well controlled. R eye blurriness improved, per patient. R periauricular numbness and tingling persists.    Objective:        Vital Signs:  BP 133/77  - Pulse 85  - Temp 36.1 ??C (Oral)  - Resp 19  - Ht 172.7 cm (5' 8)  - Wt 81.4 kg (179 lb 7.3 oz)  - SpO2 94%  - BMI 27.29 kg/m?? Input/Output:  I/O last 3 completed shifts:  In: 1430 [P.O.:1430]  Out: 4015 [Urine:3975; Chest Tube:40]    Physical Exam:  General: Cooperative, no distress, well appearing   Pulmonary: Normal work of breathing, equal bilateral chest rise. Right CT in place, no air leak noted.  Cardiac: Regular rate, HDS  Abdomen: Soft, non-distended, non-tender. Well healed mercedes incision from   Skin: No jaundice, rashes, erythema, or lesions. Skin color and texture normal.  Neurologic: Alert and interactive, no motor abnormalities noted.  CN intact, numbness of right ear and periauricular area     Labs:  Reviewed    Imaging:  All pertinent imaging personally reviewed.

## 2019-01-17 NOTE — Unmapped (Signed)
Met with caregiver in room-pt just back from MRI series and very drowsy (nurse reported he got few doses of ativan in MRI); reviewed lab and appt schedule with ex-spouse in event pt is discharged home today. Caregiver reported she doesn't drive after dark so would need to take pt home by 3:30pm. Informed her will let SRF team know.     Received page back from Dr. Charisse March that neurology cleared pt and pt will discharge home today. Will notify [facility]  Caryl Ada Inpatient Transplant Nurse Coordinator 01/17/2019 12:37 PM

## 2019-01-20 LAB — ASPERGILLUS SPECIFIC PRECIPITINS
A. FUMIGATUS IGE: <0.1 kU/L
TOTAL IGE (ASP. PRECIP): 8 [IU]/mL

## 2019-01-20 LAB — A. FUM IGE CLASS: Lab: 0

## 2019-01-23 ENCOUNTER — Encounter
Admit: 2019-01-23 | Discharge: 2019-01-23 | Payer: MEDICARE | Attending: Infectious Disease | Primary: Infectious Disease

## 2019-01-23 DIAGNOSIS — Z7982 Long term (current) use of aspirin: Secondary | ICD-10-CM | POA: Diagnosis not present

## 2019-01-23 DIAGNOSIS — Z7983 Long term (current) use of bisphosphonates: Secondary | ICD-10-CM | POA: Diagnosis not present

## 2019-01-23 DIAGNOSIS — K7469 Other cirrhosis of liver: Secondary | ICD-10-CM | POA: Diagnosis not present

## 2019-01-23 DIAGNOSIS — Z6826 Body mass index (BMI) 26.0-26.9, adult: Secondary | ICD-10-CM | POA: Diagnosis not present

## 2019-01-23 DIAGNOSIS — Z944 Liver transplant status: Secondary | ICD-10-CM | POA: Diagnosis not present

## 2019-01-23 DIAGNOSIS — C220 Liver cell carcinoma: Secondary | ICD-10-CM | POA: Diagnosis not present

## 2019-01-23 DIAGNOSIS — J90 Pleural effusion, not elsewhere classified: Secondary | ICD-10-CM | POA: Diagnosis not present

## 2019-01-23 DIAGNOSIS — D7219 Other eosinophilia: Secondary | ICD-10-CM | POA: Diagnosis not present

## 2019-01-23 NOTE — Unmapped (Signed)
[facility] Follow-Up Note    Assessment/Plan:   Anthony Alvarado is a 58 y.o. year old male   Cryptogenic cirrhosis with HCC s/p liver transplant 09/16/18  - Surgical complications: blood loss/hematoma  - Serologies: CMV D-/R+, EBV D+/R+, Toxo D-/R-  - Induction: Basiliximab, methylpred  - Immunosuppression: tacrolimus, myfortic  - Prophylaxis: bactrim  ??  Pertinent Exposure History  Donor Infections: recurrent pneumonias in past   ??  Pertinent Co-morbidities  IDDM2   ??  Drug Intolerances  ??  Infection History  # Recurrent R pleural effusion with peripheral eosinophilia  - 11/3 [place]              - 840 nuc cells, 36500 RBCs, 46% eos              - Cx NG               - exudative  - 11/13 [facility]               - 1160 nuc cells, 66780 RBCs, 7% eos, 33% neut, 23% lymph, 31% mono              - exudative              - Cx NGTD, 1+ PMN, no org  - intermittent mild peripheral eos (0.6-0.8) since transplant, no eos prior to 2019 per careeverywhere   - RF normal <8.6    RECOMMENDATIONS:  no evidence of infection at this time.  repeat CBC with diff  RTC to ID as needed.    Timothy Lasso Duin  Immunocompromised Host Infectious Diseases  Pager [phone]    Subjective:   Interval History:   He has not had fevers recently. He was SOB with DOE. Breathing is a lot improved.  He was admitted on 12/26/18 for SOB and pleural effusion, then readmitted on 11/12 for recurrence of that pleural effusion. He did not have fevers during these episodes.  He was treated empirically with ivermectin during November hospitalization.  He remains on bactrim.   We don't have a recent CBC with differential.  Denies new complaints  appetite is good. Energy level is improving    medications  Current Outpatient Medications   Medication Sig Dispense Refill   ??? acetaminophen (TYLENOL) 325 MG tablet Take 2 tablets (650 mg total) by mouth every six (6) hours as needed for pain or fever (> 38C or 100.50F). 100 tablet 0 ??? alendronate (FOSAMAX) 70 MG tablet Take 1 tablet (70 mg total) by mouth every seven (7) days. (Patient taking differently: Take 70 mg by mouth every seven (7) days. Takes on sunday) 4 tablet 11   ??? amLODIPine (NORVASC) 5 MG tablet Take 2 tablets (10 mg total) by mouth daily. 30 tablet 11   ??? aspirin (ECOTRIN) 81 MG tablet Take 1 tablet (81 mg total) by mouth daily. 30 tablet 11   ??? azelastine (ASTELIN) 137 mcg (0.1 %) nasal spray 1 spray by Each Nare route daily as needed.      ??? blood sugar diagnostic Strp Use as directed Three (3) times a day before meals. 90 each 11   ??? calcium carbonate (TUMS) 200 mg calcium (500 mg) chewable tablet Chew 1 tablet Three (3) times a day as needed for heartburn.      ??? cholecalciferol, vitamin D3, 1,000 unit (25 mcg) tablet Take 2 tablets (2,000 Units total) by mouth daily. 60 tablet 11   ??? docusate sodium (COLACE) 100  MG capsule Take 1 capsule (100 mg total) by mouth two (2) times a day as needed for constipation. 60 capsule 0   ??? EUCRISA 2 % Oint Apply 1 application topically daily as needed.      ??? fluticasone propionate (FLONASE) 50 mcg/actuation nasal spray PLACE ONE OR TWO SPRAYS INTO BOTH NOSTRILS DAILY AS NEEDED.     ??? furosemide (LASIX) 20 MG tablet Take 2 tablets (40 mg total) by mouth daily. 60 tablet 11   ??? gabapentin (NEURONTIN) 300 MG capsule Take 2 capsules (600 mg total) by mouth two (2) times a day. 120 capsule 11   ??? lancets 33 gauge Misc 1 each by Miscellaneous route Three (3) times a day before meals. 100 each 11   ??? levothyroxine (SYNTHROID) 150 MCG tablet Take 1 tablet (150 mcg total) by mouth daily. 30 tablet 11   ??? magnesium oxide-Mg AA chelate (MAGNESIUM, AMINO ACID CHELATE,) 133 mg Tab Take 1 tablet by mouth Two (2) times a day. HOLD until directed to start by your coordinator. (Patient not taking: Reported on 01/05/2019) 60 tablet 11   ??? mycophenolate (MYFORTIC) 180 MG EC tablet Take 2 tablets (360 mg total) by mouth Two (2) times a day. 120 tablet 11 ??? oxyCODONE (ROXICODONE) 5 MG immediate release tablet Take 1-2 tablets (5-10 mg total) by mouth every six (6) hours as needed for pain for up to 7 days. 60 tablet 0   ??? polyethylene glycol (GLYCOLAX) 17 gram/dose powder Mix 1 capful (17g) in 4 to 8 ounces water, juice, soda, coffee, or tea and drink daily as needed. 510 g 0   ??? sertraline (ZOLOFT) 25 MG tablet Take 1 tablet (25 mg total) by mouth daily. 30 tablet 11   ??? sulfamethoxazole-trimethoprim (BACTRIM) 400-80 mg per tablet Take 1 tablet (80 mg of trimethoprim total) by mouth 3 (three) times a week. 12 tablet 5   ??? tacrolimus (PROGRAF) 0.5 MG capsule Take 2 capsules (1 mg total) by mouth two (2) times a day. 180 capsule 11   ??? TiZANidine (ZANAFLEX) 2 MG capsule Take 2 mg by mouth Three (3) times a day as needed for muscle spasms.       No current facility-administered medications for this visit.      Facility-Administered Medications Ordered in Other Visits   Medication Dose Route Frequency Provider Last Rate Last Dose   ??? diazePAM (VALIUM) 5 mg/mL injection                Objective:  Physical Exam:  BP 109/78  - Pulse 71  - Temp 37 ??C (98.6 ??F) (Tympanic)  - Ht 172.7 cm (5' 8)  - Wt 78.8 kg (173 lb 11.2 oz)  - BMI 26.41 kg/m??   heent no thrush, no oral ulcers  Conjunctivae clear  Neck supple  Spine non-tender  No CVA tenderness  chest wounds, see media. c/d/i  Lungs cta  Heart s1,s2, no murmurs  abd soft, nt/nd  Ext no joint effusions  No edema  No rashes  Neuro: AOx3, no abnormal movements noted    LABS:  Lab Results   Component Value Date    WBC 11.4 (H) 01/17/2019    WBC 13.0 (H) 01/16/2019    WBC 17.8 (H) 01/15/2019    WBC 27.9 (H) 01/14/2019    WBC 8.8 01/13/2019    WBC 12.2 (H) 01/03/2019    WBC 10.0 12/15/2018    WBC 9.9 12/12/2018    WBC 9.3 12/08/2018  WBC 8.0 12/05/2018     Lab Results   Component Value Date    Creatinine 1.08 01/17/2019    Creatinine 1.14 01/16/2019    Creatinine 1.29 01/15/2019    Creatinine 1.68 (H) 01/14/2019 Creatinine 1.44 (H) 01/13/2019    Creatinine 1.33 (H) 01/03/2019    Creatinine 0.98 12/15/2018    Creatinine 0.95 12/12/2018    Creatinine 1.13 12/08/2018    Creatinine 1.14 12/05/2018

## 2019-01-23 NOTE — Unmapped (Signed)
Discharge Summary    Admit date: 01/05/2019    Discharge date and time: 01/17/2019 5:00 PM    Discharge to:  Home    Discharge Service: Surg Transplant [facility])    Discharge Attending Physician: No att. providers found    Discharge  Diagnoses: Pleural effusion and Pneumothorax, resolved    Secondary Diagnosis: Active Problems:    Liver transplant recipient (CMS-HCC) POA: Not Applicable    Pleural effusion POA: Yes  Resolved Problems:    * No resolved hospital problems. *      OR Procedures:    THORACENTESIS W/ IMAGING  Date  01/06/2019  -------------------    PLEURAL DRAINAGE, PERC, W INSERTION OF INDWELLING CATHETER; W IMAGING GUIDANCE  Date  01/11/2019  -------------------    Right - THORACOSCOPY, SURGICAL; WITH PLEURODESIS (EG, MECHANICAL OR CHEMICAL)  Right - Decortic Pulm (Separt Proc); Tot  Date  01/13/2019  -------------------     Ancillary Procedures: no procedures    Discharge Day Services:     Subjective   No acute events overnight. Pain Controlled. No fever or chills.    Objective   No data found.  No intake/output data recorded.    General Appearance:   No acute distress  Lungs:                Clear to auscultation bilaterally  Heart:                           Regular rate and rhythm  Abdomen:                Soft, non-tender, non-distended. Previous surgical sites well healed. CT site c/d/i.  Extremities:              Warm and well perfused Hospital Course:  Patient is a??58 y.o.??male??with cryptogenic cirrhosis and hepatocellular carcinoma??s/p??OLT 09/16/2018, aortic valve stenosis, type 2 diabetes, autoimmune cholangitis with recent drainage of intra-abdominal fluid collection by VIR, admitted recently 11/2-11/4 for thoracentesis with Interventional Pulmonology on 11/3. Since his discharge, he had progressive shortness of breath and chest discomfort and CXR on 11/12 showed recurrance of right sided pleural effusion. Interventional Pulmonolgy performed thoracentesis on 11/13 and evacuated 1L of serosangeunous fluid; fluid removal was limited by patient chest pressure with progressive drainage. ECHO performed while inpatient showed resolution of pericardial effusion and EF >55%. Pleural fluid analysis showed eosiniphillic predominance. He redeveloped a pleural effusion, now with loculations.    He underwent pigtail catheter placement by interventional pulmonology on 11/18 with 2L of output. Subsequent Thoracic Surgery consult in the setting of multiple pleural effusions now with loculations and a residual basilar pneumothorax in the right lung determined patient would benefit from operative intervention. He underwent Right pulmonary decortication and pleurodesis on 01/13/2019.    On hospital day 11, the patient had new onset of periauricular numbness and tingling as well as right eye blurriness. Neurology was consulted, who were initially concered for a central neurologic process. MRI brain and spine WWO contrast were ordered, which demonstrated no acute or chronic findings. Determined that symptoms most likely related to wearing CPAP mask at night, given negative imaging findings. He will pursue outpatient follow up with his opthalmologist for workup of his blurry vision. He is being discharged on HD 12 in stable condition at baseline ambulatory status, bowel and bladder function, tolerating a regular diet, and with pain well controlled on PO medication. He will have planned outpatient follow up in one week.  Condition at Discharge: Improved  Discharge Medications:      Medication List      START taking these medications    ??? DOK 100 MG capsule; Generic drug: docusate sodium; Take 1 capsule (100   mg total) by mouth two (2) times a day as needed for constipation.  ??? polyethylene glycol 17 gram/dose powder; Commonly known as: GLYCOLAX;   Mix 1 capful (17g) in 4 to 8 ounces water, juice, soda, coffee, or tea and   drink daily as needed.     CHANGE how you take these medications    ??? alendronate 70 MG tablet; Commonly known as: FOSAMAX; Take 1 tablet (70   mg total) by mouth every seven (7) days.; What changed: additional   instructions  ??? furosemide 20 MG tablet; Commonly known as: LASIX; Take 2 tablets (40 mg   total) by mouth daily.; What changed: how much to take, when to take this  ??? gabapentin 300 MG capsule; Commonly known as: NEURONTIN; Take 2 capsules   (600 mg total) by mouth two (2) times a day.; What changed: how much to   take  ??? tacrolimus 0.5 MG capsule; Commonly known as: PROGRAF; Take 2 capsules   (1 mg total) by mouth two (2) times a day.; What changed: how much to take     CONTINUE taking these medications    ??? acetaminophen 325 MG tablet; Commonly known as: TylenoL; Take 2 tablets   (650 mg total) by mouth every six (6) hours as needed for pain or fever (>   38C or 100.59F).  ??? amLODIPine 5 MG tablet; Commonly known as: NORVASC; Take 2 tablets (10   mg total) by mouth daily.  ??? aspirin 81 MG tablet; Commonly known as: ECOTRIN; Take 1 tablet (81 mg   total) by mouth daily.  ??? azelastine 137 mcg (0.1 %) nasal spray; Commonly known as: ASTELIN  ??? blood sugar diagnostic Strp; Use as directed Three (3) times a day   before meals. ??? calcium carbonate 200 mg calcium (500 mg) chewable tablet; Commonly   known as: TUMS  ??? cholecalciferol (vitamin D3) 1,000 unit (25 mcg) tablet; Take 2 tablets   (2,000 Units total) by mouth daily.  ??? EUCRISA 2 % Oint; Generic drug: crisaborole  ??? fluticasone propionate 50 mcg/actuation nasal spray; Commonly known as:   FLONASE  ??? lancets 33 gauge Misc; 1 each by Miscellaneous route Three (3) times a   day before meals.  ??? levothyroxine 150 MCG tablet; Commonly known as: SYNTHROID; Take 1   tablet (150 mcg total) by mouth daily.  ??? magnesium (amino acid chelate) 133 mg Tab; Generic drug: magnesium   oxide-Mg AA chelate; Take 1 tablet by mouth Two (2) times a day. HOLD   until directed to start by your coordinator.  ??? mycophenolate 180 MG EC tablet; Commonly known as: MYFORTIC; Take 2   tablets (360 mg total) by mouth Two (2) times a day.  ??? oxyCODONE 5 MG immediate release tablet; Commonly known as: ROXICODONE;   Take 1-2 tablets (5-10 mg total) by mouth every six (6) hours as needed   for pain for up to 7 days.  ??? sertraline 25 MG tablet; Commonly known as: ZOLOFT; Take 1 tablet (25 mg   total) by mouth daily.  ??? sulfamethoxazole-trimethoprim 400-80 mg per tablet; Commonly known as:   BACTRIM; Take 1 tablet (80 mg of trimethoprim total) by mouth 3 (three)   times a week.  ??? TiZANidine  2 MG capsule; Commonly known as: ZANAFLEX     STOP taking these medications    ??? oxyCODONE-acetaminophen 5-325 mg per tablet; Commonly known as: PERCOCET         Other Instructions:  Other Instructions     Discharge instructions      Diet: Resume regular diet    Other Instructions:    Contact your transplant coordinator or the Transplant Surgery Office [phone]) during business hours or page the transplant coordinator on call [phone]) after business hours for:    - fever >100.5 degrees F by mouth, any fever with shaking chills, or other signs or symptoms of infection - uncontrolled nausea, vomiting, or diarrhea; inability to have a bowel movement for > 3 days.   - any problem that prevents taking medications as scheduled.   - pain uncontrolled with prescribed medication or new pain or tenderness at the surgical site   - sudden weight gain or increase in blood pressure (greater than 140/85)   - shortness of breath, chest pain / discomfort   - new or increasing jaundice   - urinary symptoms including pain / difficulty / burning or tea-colored urine   - any other new or concerning symptoms   - questions regarding your medications or continuing care      You may shower, but should not immerse wounds in bath or pool for 2-3 weeks. Wash the surgical site with mild soap and water, but do not scrub vigorously.    You may dress wounds with dry gauze and tape to avoid soilage.    Do not drive or operate heavy machinery prior to MD clearance, or at any time while taking narcotics.    Inspect surgical/procedure sites at least twice daily, contact Transplant Coordinator for spreading redness, purulent discharge, or increasing bleeding or drainage, or for separation of wounds.     Maintain a written record of daily vital signs, per Handbook instructions.     Maintain a written record of medications taken and review against the discharge medications sheet (orange paper). Periodically review your Transplant Handbook for important information regarding postoperative care and required precautions.      Labs and Other Follow-ups after Discharge:   Liver  Labs 3x week: CBC, CMP, GGT, Mg, Phos, and Tacrolimus trough level    Transplant Coordinator:  Esmond Harps- phone: [phone] fax: [phone]  Emilio Math- phone: [phone] fax: [phone]  Patsey Berthold- phone: [phone] fax: [phone]  Celine Ahr- phone: [phone] fax: [phone]  [facility] Mariea Stable- phone: [phone] fax: [phone]      Please keep all of your scheduled appointments. Labs and Other Follow-ups after Discharge:  Follow Up instructions and Outpatient Referrals     Discharge instructions            Future Appointments:  Appointments which have been scheduled for you    Jan 31, 2019 12:00 PM  (Arrive by 11:30 AM)  XR CHEST PA AND LATERAL with Toney Reil RM 4  IMG DIAG X-RAY [facility]) [address]  [place] [address]  [phone]      Jan 31, 2019 12:30 PM  (Arrive by 12:00 PM)  Danelle Earthly POST OP with Evert Kohl, MD  [facility] SURGERY ONCOLOGY [facility] REGION) [address]  [place] [address]  [phone]      Feb 01, 2019 10:00 AM  (Arrive by 9:30 AM)  NEW  NON PROCEDURAL with Kathie Dike, MD  [facility]  PAIN MANAGEMENT CENTER [address] [facility] REGION) [address] [place] [address]  [phone]      Mar 20, 2019  8:30 AM  (Arrive by 8:00 AM)  VIDEO VISIT- OTHER with Adalberto Cole, PhD  [facility] TRANSPLANT SURGERY [facility] REGION) [address]  [place] [address]  [phone]      Mar 20, 2019 10:20 AM  (Arrive by 9:50 AM)  RETURN PHARMD with Fleeta Emmer, CPP  [facility] TRANSPLANT SURGERY [facility] REGION) [address]  [place] [address]  [phone]      Mar 20, 2019 11:00 AM  (Arrive by 10:30 AM)  VIDEO VISIT- OTHER with Lanelle Bal, RD/LDN  [facility] NUTRITION SERVICES TRANSPLANT [facility] REGION) [address]  [place] [address]  [phone]      Mar 20, 2019  1:45 PM  (Arrive by 1:15 PM)  RETURN 15 with Chirag Lanney Gins, MD  [facility] TRANSPLANT SURGERY [facility] REGION) [address]  [place] [address]  [phone]      Jun 19, 2019 12:45 PM  (Arrive by 12:15 PM)  RETURN 15 with Chirag Lanney Gins, MD  [facility] TRANSPLANT SURGERY [facility] REGION) [address] [place] [address]  [phone]      Jun 19, 2019  1:00 PM  (Arrive by 12:30 PM)  RETURN PHARMD with Fleeta Emmer, CPP  [facility] TRANSPLANT SURGERY [place] [facility] REGION) [address]  [place] [address]  [phone]

## 2019-01-24 DIAGNOSIS — E612 Magnesium deficiency: Secondary | ICD-10-CM | POA: Diagnosis not present

## 2019-01-24 DIAGNOSIS — Z944 Liver transplant status: Secondary | ICD-10-CM | POA: Diagnosis not present

## 2019-01-24 DIAGNOSIS — Z79899 Other long term (current) drug therapy: Secondary | ICD-10-CM | POA: Diagnosis not present

## 2019-01-25 LAB — CBC W/ DIFFERENTIAL
BANDED NEUTROPHILS ABSOLUTE COUNT: 0.6 10*3/uL — ABNORMAL HIGH (ref 0.0–0.1)
BASOPHILS ABSOLUTE COUNT: 0.2 10*3/uL (ref 0.0–0.2)
EOSINOPHILS ABSOLUTE COUNT: 0.6 10*3/uL — ABNORMAL HIGH (ref 0.0–0.4)
EOSINOPHILS RELATIVE PERCENT: 5 %
HEMATOCRIT: 35 % — ABNORMAL LOW (ref 37.5–51.0)
HEMOGLOBIN: 10.7 g/dL — ABNORMAL LOW (ref 13.0–17.7)
IMMATURE GRANULOCYTES: 5 %
LYMPHOCYTES ABSOLUTE COUNT: 1.8 10*3/uL (ref 0.7–3.1)
LYMPHOCYTES RELATIVE PERCENT: 15 %
MEAN CORPUSCULAR HEMOGLOBIN CONC: 30.6 g/dL — ABNORMAL LOW (ref 31.5–35.7)
MEAN CORPUSCULAR HEMOGLOBIN: 24.9 pg — ABNORMAL LOW (ref 26.6–33.0)
MEAN CORPUSCULAR VOLUME: 82 fL (ref 79–97)
MONOCYTES ABSOLUTE COUNT: 1 10*3/uL — ABNORMAL HIGH (ref 0.1–0.9)
NEUTROPHILS RELATIVE PERCENT: 64 %
PLATELET COUNT: 420 10*3/uL (ref 150–450)
RED BLOOD CELL COUNT: 4.29 x10E6/uL (ref 4.14–5.80)
RED CELL DISTRIBUTION WIDTH: 13.8 % (ref 11.6–15.4)
WHITE BLOOD CELL COUNT: 11.8 10*3/uL — ABNORMAL HIGH (ref 3.4–10.8)

## 2019-01-25 LAB — PHOSPHORUS, SERUM: Phosphate:MCnc:Pt:Ser/Plas:Qn:: 4.5 — ABNORMAL HIGH

## 2019-01-25 LAB — COMPREHENSIVE METABOLIC PANEL
ALBUMIN: 3.8 g/dL (ref 3.8–4.9)
ALT (SGPT): 21 IU/L (ref 0–44)
AST (SGOT): 18 IU/L (ref 0–40)
BLOOD UREA NITROGEN: 25 mg/dL — ABNORMAL HIGH (ref 6–24)
BUN / CREAT RATIO: 24 — ABNORMAL HIGH (ref 9–20)
CALCIUM: 9.4 mg/dL (ref 8.7–10.2)
CHLORIDE: 105 mmol/L (ref 96–106)
CO2: 24 mmol/L (ref 20–29)
CREATININE: 1.05 mg/dL (ref 0.76–1.27)
GLUCOSE: 104 mg/dL — ABNORMAL HIGH (ref 65–99)
POTASSIUM: 5.1 mmol/L (ref 3.5–5.2)
SODIUM: 142 mmol/L (ref 134–144)
TOTAL PROTEIN: 6.7 g/dL (ref 6.0–8.5)

## 2019-01-25 LAB — BILIRUBIN, DIRECT: BILIRUBIN DIRECT: 0.1 mg/dL (ref 0.00–0.40)

## 2019-01-25 LAB — BILIRUBIN DIRECT: Bilirubin.glucuronidated+Bilirubin.albumin bound:MCnc:Pt:Ser/Plas:Qn:: 0.1

## 2019-01-25 LAB — BUN / CREAT RATIO: Urea nitrogen/Creatinine:MRto:Pt:Ser/Plas:Qn:: 24 — ABNORMAL HIGH

## 2019-01-25 LAB — GAMMA GLUTAMYL TRANSFERASE: Gamma glutamyl transferase:CCnc:Pt:Ser/Plas:Qn:: 96 — ABNORMAL HIGH

## 2019-01-25 LAB — MAGNESIUM: Magnesium:MCnc:Pt:Ser/Plas:Qn:: 2.2

## 2019-01-25 LAB — WHITE BLOOD CELL COUNT: Leukocytes:NCnc:Pt:Bld:Qn:Automated count: 11.8 — ABNORMAL HIGH

## 2019-01-26 DIAGNOSIS — Z79899 Other long term (current) drug therapy: Principal | ICD-10-CM

## 2019-01-26 DIAGNOSIS — Z944 Liver transplant status: Principal | ICD-10-CM

## 2019-01-26 LAB — TACROLIMUS BLOOD: Tacrolimus:MCnc:Pt:Bld:Qn:LC/MS/MS: 2.2

## 2019-01-26 MED ORDER — TACROLIMUS 0.5 MG CAPSULE
ORAL_CAPSULE | Freq: Two times a day (BID) | ORAL | 11 refills | 30.00000 days | Status: CP
Start: 2019-01-26 — End: ?
  Filled 2019-02-07: qty 240, 30d supply, fill #0

## 2019-01-26 NOTE — Unmapped (Signed)
Patient's 12/1 labs with very low tac level since dose change while inpatient. Spoke to patient, who confirmed he is taking 1mg  bid and has not missed any doses in the last week. He said his SOB is relieved, but he does get winded some when ambulating longer distances. He continues to endorse surgical site pain from the pulm.procedure. He denies fever or any other sx. Reviewed his tac dose/level with txp pharmD Mincemoyer, who recommended patient return to dose of 2mg  bid. Spoke with patient and relayed dose adjustment. He verbalized understanding and agreed to continue with weekly lab interval for now.

## 2019-01-27 NOTE — Unmapped (Signed)
Tacrolimus dose increase  Last filled 01/13/19  RFTS til 01/30/19

## 2019-01-30 NOTE — Unmapped (Signed)
RFTS update: $0 copay

## 2019-01-30 NOTE — Unmapped (Signed)
[facility] Refill Coordination Note    Specialty Medication(s) to be Shipped:   Transplant:  mycophenolic acid 180mg  and tacrolimus 0.5mg     Other medication(s) to be shipped:   Gabapentin  Bactrim  Alendronate       Anthony Alvarado, DOB: [birth year]  Phone: [phone] (home)       All above HIPAA information was verified with patient.     Was a Nurse, learning disability used for this call? No    Completed refill call assessment today to schedule patient's medication shipment from the [facility] [phone]).       Specialty medication(s) and dose(s) confirmed: Regimen is correct and unchanged.   Changes to medications: [name] reports no changes at this time.  Changes to insurance: No  Questions for the pharmacist: No    Confirmed patient received Welcome Packet with first shipment. The patient will receive a drug information handout for each medication shipped and additional FDA Medication Guides as required.       DISEASE/MEDICATION-SPECIFIC INFORMATION        N/A    SPECIALTY MEDICATION ADHERENCE     Medication Adherence    Patient reported X missed doses in the last month: 0          mycophenolic acid 180mg : 12 days worth of medication on hand.    tacrolimus 0.5mg : 10 days worth of medication on hand.            SHIPPING     Shipping address confirmed in Epic.     Delivery Scheduled: Yes, Expected medication delivery date: 02/08/19.     Medication will be delivered via UPS to the prescription address in Epic WAM.    [place] A Camilah Spillman   [facility] Specialty Technician

## 2019-01-31 ENCOUNTER — Ambulatory Visit: Admit: 2019-01-31 | Discharge: 2019-02-01 | Payer: MEDICARE

## 2019-01-31 ENCOUNTER — Encounter: Admit: 2019-01-31 | Discharge: 2019-02-01 | Payer: MEDICARE

## 2019-01-31 DIAGNOSIS — J869 Pyothorax without fistula: Principal | ICD-10-CM

## 2019-01-31 DIAGNOSIS — Z4823 Encounter for aftercare following liver transplant: Secondary | ICD-10-CM | POA: Diagnosis not present

## 2019-01-31 DIAGNOSIS — E119 Type 2 diabetes mellitus without complications: Secondary | ICD-10-CM | POA: Diagnosis not present

## 2019-01-31 DIAGNOSIS — E612 Magnesium deficiency: Secondary | ICD-10-CM | POA: Diagnosis not present

## 2019-01-31 DIAGNOSIS — Z944 Liver transplant status: Secondary | ICD-10-CM | POA: Diagnosis not present

## 2019-01-31 DIAGNOSIS — J90 Pleural effusion, not elsewhere classified: Secondary | ICD-10-CM | POA: Diagnosis not present

## 2019-01-31 DIAGNOSIS — Z79899 Other long term (current) drug therapy: Secondary | ICD-10-CM | POA: Diagnosis not present

## 2019-01-31 LAB — CBC W/ DIFFERENTIAL
BASOPHILS ABSOLUTE COUNT: 0.1 10*3/uL (ref 0.0–0.2)
BASOPHILS RELATIVE PERCENT: 1 %
EOSINOPHILS ABSOLUTE COUNT: 0.4 10*3/uL (ref 0.0–0.4)
EOSINOPHILS RELATIVE PERCENT: 4 %
HEMATOCRIT: 35.9 % — ABNORMAL LOW (ref 37.5–51.0)
HEMOGLOBIN: 11.2 g/dL — ABNORMAL LOW (ref 13.0–17.7)
IMMATURE GRANULOCYTES: 1 %
LYMPHOCYTES ABSOLUTE COUNT: 1.8 10*3/uL (ref 0.7–3.1)
LYMPHOCYTES RELATIVE PERCENT: 15 %
MEAN CORPUSCULAR HEMOGLOBIN CONC: 31.2 g/dL — ABNORMAL LOW (ref 31.5–35.7)
MEAN CORPUSCULAR HEMOGLOBIN: 25.4 pg — ABNORMAL LOW (ref 26.6–33.0)
MEAN CORPUSCULAR VOLUME: 81 fL (ref 79–97)
MONOCYTES ABSOLUTE COUNT: 1 10*3/uL — ABNORMAL HIGH (ref 0.1–0.9)
NEUTROPHILS ABSOLUTE COUNT: 8.7 10*3/uL — ABNORMAL HIGH (ref 1.4–7.0)
NEUTROPHILS RELATIVE PERCENT: 71 %
PLATELET COUNT: 329 10*3/uL (ref 150–450)
RED CELL DISTRIBUTION WIDTH: 15.1 % (ref 11.6–15.4)
WHITE BLOOD CELL COUNT: 12.1 10*3/uL — ABNORMAL HIGH (ref 3.4–10.8)

## 2019-01-31 LAB — IMMATURE GRANULOCYTES: Granulocytes.immature/100 leukocytes:NFr:Pt:Bld:Qn:Automated count: 1

## 2019-01-31 MED ORDER — GABAPENTIN 300 MG CAPSULE
ORAL_CAPSULE | Freq: Three times a day (TID) | ORAL | 3 refills | 90 days | Status: CP
Start: 2019-01-31 — End: 2020-01-31

## 2019-01-31 NOTE — Unmapped (Signed)
Thank you for coming to see Anthony Alvarado today.     We increased your gabapentin to 600 mg, three times per day to better help with your pain relief.     We would like to see you back in one month to see how your pain has improved.

## 2019-01-31 NOTE — Unmapped (Signed)
Thoracic Surgery Clinic Follow-up Note      Assessment and Plan    Anthony Alvarado is a 58 y.o. male with history of cryptogenic cirrhosis and hepatocellular carcinoma s/o OLT 09/16/2018, aortic valve stenosis, T2DM, and autoimmune cholangitis.  He is s/p right thoracotomy and decortication.      S/p Right Thoracotomy, decortication, pleurodesis:  Compared to pre-operative imaging and at discharge, his CXR appears well improved. He is overall doing well with improvement in his symptoms of dyspnea, but continues to have neuropathic pain along his thoracotomy incision site and associated dermatomes extending along the intercostal spaces medially. This pain is likely due to irritation of the intercostal nerves intraoperatively.     Plan:  -- Increase gabapentin to 600mg  TID  -- Continue tylenol and oxycodone prn  -- F/u appointment and CXR in one month       Subjective    Anthony Alvarado is a 58 y.o. male with a history of cryptogenic cirrhosis and hepatocellular carcinoma s/o OLT 09/16/2018, aortic valve stenosis, T2DM, and autoimmune cholangitis who presents today for post-operative follow up. He states that he is doing well, with improved breathing and exertional tolerance. He describes the persistence of 4/10 aching pain and tingling around the thoracotomy incision site with extension toward the medial chest wall. He states that this pain is worse with movement, or jostling while driving in the car. The pain is not pleuritic and non-tender to palpation. Pain is mildly improved with tylenol, and oxycodone provides him relief to sleep. He currently takes gabapentin 300mg  TID. He denies cough, hemoptysis, subjective fevers, recent illness, constipation or urinary symptoms. He denies any drainage, tenderness, or swelling of his incisions sites.       Objective    Physical Exam: BP 127/78  - Pulse 70  - Temp 36.9 ??C (98.4 ??F) (Temporal)  - Resp 18  - Ht 172.7 cm (5' 8)  - Wt 80.4 kg (177 lb 4.8 oz)  - SpO2 100%  - BMI 26.96 kg/m??   General: awake, alert and oriented x 3   HEENT: NCAT, MMM, schlera anicteric   Neck: supple, trachea midline   Chest: Non labored breathing on room air, slightly diminished breath sounds to RLL. Clear at all other lung sites.    CVS: Normal rate. Regular rhythm.   ABD: soft, NT, ND   Ext: warm and well perfused. No peripheral edema.   Incisions:  Clean dry and intact.  His chest tube sutures were removed today    Data Review:     Imaging: Reviewed by thoracic team.     Chest x-ray 01/31/2019  FINDINGS:   ??  Radiographically clear lungs.  ??  Small loculated right pleural effusion. No pneumothorax.  ??  Unremarkable cardiomediastinal silhouette.   ??  IMPRESSION:  ??  Small loculated right pleural effusion.

## 2019-02-01 ENCOUNTER — Encounter: Admit: 2019-02-01 | Discharge: 2019-02-02 | Payer: MEDICARE | Attending: Anesthesiology | Primary: Anesthesiology

## 2019-02-01 DIAGNOSIS — G8912 Acute post-thoracotomy pain: Principal | ICD-10-CM

## 2019-02-01 DIAGNOSIS — G588 Other specified mononeuropathies: Principal | ICD-10-CM

## 2019-02-01 DIAGNOSIS — R52 Pain, unspecified: Principal | ICD-10-CM

## 2019-02-01 DIAGNOSIS — M7918 Myalgia, other site: Principal | ICD-10-CM

## 2019-02-01 DIAGNOSIS — M792 Neuralgia and neuritis, unspecified: Principal | ICD-10-CM

## 2019-02-01 LAB — COMPREHENSIVE METABOLIC PANEL
ALBUMIN: 4.6 g/dL (ref 3.8–4.9)
ALT (SGPT): 24 IU/L (ref 0–44)
AST (SGOT): 22 IU/L (ref 0–40)
BILIRUBIN TOTAL: 0.3 mg/dL (ref 0.0–1.2)
BLOOD UREA NITROGEN: 22 mg/dL (ref 6–24)
BUN / CREAT RATIO: 19 (ref 9–20)
CALCIUM: 9.8 mg/dL (ref 8.7–10.2)
CHLORIDE: 104 mmol/L (ref 96–106)
CO2: 21 mmol/L (ref 20–29)
GLOBULIN, TOTAL: 2 g/dL (ref 1.5–4.5)
GLUCOSE: 96 mg/dL (ref 65–99)
POTASSIUM: 5.3 mmol/L — ABNORMAL HIGH (ref 3.5–5.2)
SODIUM: 142 mmol/L (ref 134–144)
TOTAL PROTEIN: 6.6 g/dL (ref 6.0–8.5)

## 2019-02-01 LAB — BUN / CREAT RATIO: Urea nitrogen/Creatinine:MRto:Pt:Ser/Plas:Qn:: 19

## 2019-02-01 LAB — PHOSPHORUS, SERUM: Phosphate:MCnc:Pt:Ser/Plas:Qn:: 4.3 — ABNORMAL HIGH

## 2019-02-01 LAB — BILIRUBIN DIRECT: Bilirubin.glucuronidated+Bilirubin.albumin bound:MCnc:Pt:Ser/Plas:Qn:: 0.1

## 2019-02-01 LAB — MAGNESIUM: Magnesium:MCnc:Pt:Ser/Plas:Qn:: 1.9

## 2019-02-01 LAB — GAMMA GLUTAMYL TRANSFERASE: Gamma glutamyl transferase:CCnc:Pt:Ser/Plas:Qn:: 73 — ABNORMAL HIGH

## 2019-02-01 MED ORDER — LIDOCAINE 5 % TOPICAL PATCH
MEDICATED_PATCH | TRANSDERMAL | 2 refills | 30 days | Status: CP
Start: 2019-02-01 — End: ?

## 2019-02-01 NOTE — Unmapped (Addendum)
Today we did the following :  -It was good to see you    -If we have prescribed you a medication, be prepared that your insurance company may require additional paperwork (even for generic medication), typically called a prior authorization.  This sometimes can delay the prescription, but know that our office is very efficient at taking care of these.  If it is significantly delayed, you may call the pharmacy for updates, or call the office.    1. Continue Gabapentin 600mg  three times daily  2. Continue Oxycodone as needed for severe pain per your surgeon  3. We will start Lidocaine patches to apply over the area of most pain    We will plan to see you again in a few weeks to see how you are doing.     Our clinic will contact you to schedule your follow up appointment.     -Please plan to arrive to all appointments at least 30 minutes prior to your scheduled appointment time. Failure to do so may delay your visit.    MyChart Messages:  Please use MyChart for non-urgent symptoms or matters such as general questions, non-urgent prescription refills, or non-urgent scheduling issues. For your safety and best  care please DO NOT use MyChart messages to report emergent or urgent symptoms as messages are only checked during regular business hours.  These messages are checked by the nurses during normal business hours 8:30 am-4:30 pm Monday-Friday every 24-48 hours and are for non-urgent, non-emergent concerns. You may be asked to return for a follow up visit if it is deemed your questions are best handled in the clinic setting.  Please call our nurse line below for more time sensitive issues. If our office is closed you can contact the hospital operator to reach the on call provider available for urgent/emergent issues only. -Because of the high volume of calls we receive and the high demand for our clinical services, we are occupied all day providing care for patients in the clinic. This leaves little time to respond to phone calls, and we are generally unable to discuss patient care advice over the telephone. If you are experiencing a medication side effect or complication, you can call and let us know, but we will typically not make a medication substitution or change over the telephone.     We are generally unable to respond acutely to a flare up of pain, as this is quite common in our patients and needs to be dealt with as part of the long term management plan. Please make an appointment with [place] if you wish to discuss a matter in any detail. Should you still need to call, please do so at [phone].       Thank you for choosing [facility]. It was a pleasure to see you in clinic today. Please contact us with any questions or concerns at [phone].     What are common side effects from pain medications you may be taking?    Do not drive or do anything with responsibility until you know you are tolerating a new medication.    Many pain type medications can be sedating, especially in combination with others.  They can also decrease your breathing especially in combination with other medications.  If a medication is not effective for you, talk to [place] about weaning off (do not stop abruptly on your own), so we do not just continue it.    Opioids/Narcotics (Examples: oxycodone, hydrocodone, tramadol, morphine,  hydromorphone, fentanyl patch)  ?? Constipation  ?? Itching  ?? Nausea  ?? Dizziness or lightheadedness  ?? Drowsiness or sedation  ?? Decreased breathing   ?? Addiction     Anticonvulsants (Examples: Gabapentin, Pregabalin, Carbamazepine, topiramate)  New warning in 2019 that these medications in combination with opioids can cause respiratory depression.  ?? Dizziness or lightheadedness  ?? Drowsiness or sedation  ?? Nausea ?? Leg swelling     Antidepressants (Examples: duloxetine, amitriptyline, nortriptyline, venlafaxine)  ?? Dizziness or lightheadedness  ?? Drowsiness or sedation  ?? Constipation  ?? Urinary retention  ?? Dry mouth  ?? Nausea  ?? Difficulty falling asleep     NSAIDS (Examples: Ibuprofen, naproxen, meloxicam, celecoxib)  -The FDA put out guidelines in 2015 that NSAIDs should not be used regularly due to safety concerns.  OK to use a few times a month, or for 1-2 weeks for a flare of pain.  If you take NSAIDs everyday, we should discuss this and stop it due to safety.  ?? Bleeding  ?? Stomach ulcers  ?? Kidney problems   -Heart Attack  -Stroke     Muscle relaxants (Examples: cyclobenzaprine, tizanidine, baclofen)  ?? Dizziness or lightheadedness  ?? Drowsiness or sedation  ?? Nausea  ?? Low blood pressure  ?? Dry mouth     Many pain medications can lead to a rare, potentially dangerous condition called serotonin syndrome. Symptoms can include tremors, agitation, fever, muscle rigidity/contractions, diarrhea, excessive sweating, palpitations and high blood pressure. If these symptoms arise, please contact us or report to the emergency room for further management.      Your pharmacist can answer any questions you may have in regard to all your medications or possible interactions.

## 2019-02-01 NOTE — Unmapped (Signed)
[facility] [address]  [facility] [address] [address] [place]  [address] [place] [address]  [phone]    Date: February 01, 2019  Patient Name: Anthony Alvarado  MRN: [id number]  PCP: Joaquim Nam  Referring Provider: Bertram Denver, *    I spent 20 minutes on the real-time audio and video with the patient. I spent an additional 26 minutes on pre- and post-visit activities.   The patient consented to this consult.    The patient was physically located in [place] or a state in which I am permitted to provide care. The patient understood that s/he may incur co-pays and cost sharing, and agreed to the telemedicine visit. The visit was completed via phone and/or video, which was appropriate and reasonable under the circumstances given the patient's presentation at the time.    The patient has been advised of the potential risks and limitations of this mode of treatment (including, but not limited to, the absence of in-person examination) and has agreed to be treated using telemedicine. The patient's/patient's family's questions regarding telemedicine have been answered. No vitals or physical exam was performed but the previous exam was copied forward in this note for continuity.     If the phone/video visit was completed in an ambulatory setting, the patient has also been advised to contact their provider???s office for worsening conditions, and seek emergency medical treatment and/or call 911 if the patient deems either necessary.    Visit modifiers:   POS 02 and 95 (virtual visit with video)    -Location of patient during visit: [facility]  -Provider location: [place]  -Names of all people present during visit: patient, Dr. Eustaquio Maize, Dr. Riley Kill, Betsy Coder (scribe)    Assessment:        1. Acute post-thoracotomy pain    2. Pain    3. Myofascial pain    4. Intercostal neuralgia    5. Neuropathic pain Anthony Alvarado is a 58 y.o. male who is being [facility] the [facility] for right sided pain localized near his prior surgical incision s/p right thoracotomy (01/13/19) for decortication, and pleurodesis for a recurrent pleural effusion. Patient has a PMH significant for cryptogenic cirrhosis and hepatocellular carcinoma??s/p??OLT 09/16/2018. He also carries a past medical history including DM2 (diet controlled), HTN, OSA.     Today, patient reports pain localized to his right chest, adjacent to his thoracotomy incision. The pain does not radiate anteriorly, however he describes neuropathic like symptoms near the surgical site. Based on how close we are to his operative date his symptoms appear primarily myofascial, however he may also have a neuropathic component as well. He reports relief with Gabapentin 300mg  TID and this was recently increased yesterday to 600mg  TID. We discussed potentially proceeding with an intercostal nerve block, but he would like to manage conservatively for the time being. He has not yet applied topical medications to the area, so we recommend lidocaine patches and will provide a prescription for those today. As he recently started taking the increased dose of gabapentin, we will see how he does with this regimen for a few weeks and plan to see him again to see how he is doing. If his pain is not improved, we could consider adding Cymbalta for further control of these neuropathic symptoms.  Patient is currently managed on oxycodone 5 mg at bedtime, gabapentin 600 mg TID, and Tylenol 650 mg q6h prn.       Plan:      -  Continue oxycodone 5mg  PRN for severe pain per surgeon  - Continue gabapentin 600 mg TID  - Apply Lidocaine patches near surgical site  - Follow up on 03/15/19    Future Considerations:  Cymbalta  Intercostal nerve block    Requested Prescriptions     Signed Prescriptions Disp Refills   ??? lidocaine (LIDODERM) 5 % patch 30 patch 2 Sig: Place 1 patch on the skin daily. Apply to affected area for 12 hours only each day (then remove patch)     No orders of the defined types were placed in this encounter.         Subjective:      HPI:  [name] is seen in consultation at the request of Bertram Denver, * for evaluation and recommendations regarding his pain localized near previous thoracotomy site s/p thoracotomy on 01/13/19 for decortication in the setting of persistent pleural effusion. Patient has PMH significant for cryptogenic cirrhosis and hepatocellular carcinoma??s/p??OLT 09/16/2018, DM2 (diet controlled), HTN, OSA.     Patient reports abdominal pain which began following his OLT on 09/16/2018. Abdominal pain is localized to his incision site only. Patient most recently underwent a right pulmonary thoracotomy, decortication, and pleurodesis on 01/13/19 and has noted pain to his right chest, adjacent to his incision since that time. The pain is described as an ache, which is occasionally sharp. The pain is associated with numbness. The pain does not radiate anteriorly. The pain near the thoracotomy incision is worse than the incision from the liver transplant.   He denies any redness, swelling or drainage near the incision.     Patient is currently managed on oxycodone 5-10 mg at bedtime, gabapentin 600 mg TID, and Tylenol 650 mg q6h prn. His gabapentin was increased to 600 mg TID yesterday and patient notes he has not had enough time to evaluate whether or not the increase has been beneficial. Has tried lidocaine patches in the past, but not for his new right chest pain.    Current medication regimen:  Oxycodone 5-10 mg q 6 hr prn (taking 5mg  q HS)  Gabapentin 600 mg TID  Tylenol 650 mg q6h prn       Current view: Showing all answers    Legend:         Triggered a BPA  Scoring question                     [facility] New Patient Questionnaire     Question 01/25/2019 12:18 PM EST - Filed by Patient Primary Care Physician Name: Crawford Givens    Referring Physician Name: DR [facility] LIVER [facility]    Reason for today's visit: PAIN MANAGEMENT    Date of onset of your pain: 09/16/2018    What started your pain? LIVER TRANSPLANT    What questions would you like to ask your doctor today?       Please circle the location of your pain.     Please select the words that describe your pain. Aching     Pulsing     Sharp    Please rate your pain at its WORST in the past month. 8    Please rate your pain at its LEAST in the past month. 3    Please rate your pain on AVERAGE in the past month. 4    How did your pain start? Other    Do you have any of the following? Numbness  Tingling     Change in skin sensitivity    Have you had any loss of bowel of bladder control? No    What worsens your problem? Exercise     Sitting/Laying down    How often do you have pain? All the time    What helps your pain? Medication     Nothing    Have you tried any of the following for your pain? Medication    Have you had any of the following tests for your current problem within the past 7 years? MRI     X-Rays     CT Scan    Will your visit involve Workman's Compensation? No    If this is the result of an accident/injury, are you involved in a lawsuit or have a lawyer? No    Please select the phrase(s) which best reflects your treatment goals. Complete resolution of the pain and no medication     Complete resolution of my pain with medications if necessary    Is there anything else we should know about your pain, goals, or your feelings about pain management?       What medications have you tried in the past for your pain? OXYCODONE    Please list all current medications you are taking:       General: Weight Loss/Gain     Night Sweats     Fatigue     Difficulty Sleeping     Daytime Drowsiness    Head, Ears, Eyes, Nose and Throat: Hearing Problems     Ringing in the Ears    Skin:       Pulmonary - (Lungs): Shortness of Breath Endocrine (Hormonal System): Intolerance to heat or cold    Gastrointestinal - (Intestinal):       Genitourinary - (Kidneys and Bladder):       Musculoskeletal System - (Muscles, Joints and Coverings): Muscle Aches/Weakness    Cardiovascular: Shortness of Breath with Exertion    Neurologic: Numbness/Tingling    Psychiatric: Anxiety     Low Concentration     Low Energy    Blood System:       Immune System: Allergies    Social History:     What is your marital status? Separated    How many children do you have? 19 - 15 YEARS OLD - 2 STEP CHILDREN    What are your living arrangements? Living alone    What is your highest level of education? High School    What is your employment status? Disabled    Occupation Curator    What do you do for exercise? WALK    How often do you exercise?       Have you ever used tobacco products? No    How often do you drink alcohol? NO    Have you ever used recreational drugs? NO    Have you ever been in treatment for any kind of substance abuse (alcohol or any kind of drugs)?   NO    Do you currently use recreational drugs?  NO    Do you currently use another person's prescription medications? NO    Have you ever been in treatment for drug or alcohol dependency? No        Previous treatments include: None     Medications tried include:  NSAIDS- none tried  Antidepressants- none tried  Neuroleptics- gabapentin  Muscle relaxants- cyclobenzaprine (Flexeril)  Topicals- None  Short-acting opiates- oxycodone  Long-acting  opiates- None  Anxiolytics- none    Previous interventional procedures include: none    Previous imaging/diagnostic studies include:     MRI Cervical Spine 01/17/19  FINDINGS:   Diffuse motion artifact is present on several sequences, particularly on the sagittal sequences. Patient's head is rotated towards the right.  Bone marrow signal intensity is normal. Normal signal in the spinal cord. The vertebral bodies are normally aligned. Multilevel endplate osteophytes, uncovertebral hypertrophy, and disc height loss.   C1-C2: Trace fluid in right C1-C2 joint. No significant canal stenosis.  C2-C3: Mild facet arthropathy. No significant canal stenosis or foraminal narrowing.  C3-C4: Posterior osteophyte disc complex. Mild canal stenosis. No significant foraminal narrowing.  C4-C5: Posterior osteophyte disc complex with superimposed left subarticular zone protrusion. Moderate canal and severe left subarticular zone stenosis. Mild left foraminal narrowing.  C5-C6: Posterior osteophyte disc complex. Uncovertebral hypertrophy. Moderate canal stenosis. Mild to moderate bilateral foraminal narrowing.??  C6-C7: No significant canal stenosis or foraminal narrowing.  C7-T1: No significant canal stenosis or foraminal narrowing.??  Visualized upper thoracic spine to T3-T4 disc level without significant canal stenosis or foraminal narrowing.  The paraspinal tissues are within normal limits.    IMPRESSION:  Suboptimal examination due to diffuse motion artifact.  - No acute cervical spine abnormality.  - Multilevel degenerative changes of the cervical spine with varying degrees of canal stenosis (moderate C4-C5 and C5-C6) and foraminal narrowing, as detailed above.    XR Lumbar Spine 11/30/18  FINDINGS:   There are 5 nonrib-bearing lumbar vertebra. Partial sacralization of L5. No acute fracture. Normal curvature. Vertebral body heights are maintained. Mild disc space narrowing and endplate osteophyte formation at L3/L4 and L4/L5  Sequela of recent liver transplant. Moderate colonic distention, incompletely evaluated.  ??  IMPRESSION:  Multilevel degenerative changes of the lumbar spine, most prominent at L3-L5. Sacralization of the L5 vertebral body.    MRI Abdomen 11/30/18  IMPRESSION:  - Postoperative sequelae of orthotopic liver transplantation. - T1 hyperintense collection posterior to the right hepatic lobe adjacent to the IVC anastomosis, compatible with postoperative hematoma and grossly unchanged in size when compared with prior recent CTs.  - Loculated collection along the falciform ligament is also grossly unchanged and likely simple loculated ascites. No rim enhancement or restricted diffusion to suggest abscess.  - Mild splenomegaly.    Past Medical History:   Diagnosis Date   ??? Aortic valve stenosis    ??? Autoimmune cholangitis    ??? 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 Cholestatic cirrhosis (CMS-HCC)    ??? Hepatic encephalopathy (CMS-HCC)    ??? Hypertension    ??? Sleep apnea    ??? Type 2 diabetes mellitus (CMS-HCC)      Past Surgical History:   Procedure Laterality Date   ??? APPENDECTOMY     ??? CHG US GUIDE, TISSUE ABLATION N/A 05/03/2018    Procedure: ULTRASOUND GUIDANCE FOR, AND MONITORING OF, PARENCHYMAL TISSUE ABLATION;  Surgeon: Particia Nearing, MD;  [facility];  Service: Transplant   ??? PR CATH PLACE/CORON ANGIO, IMG SUPER/INTERP,R&L HRT CATH, L HRT VENTRIC N/A 03/21/2018    Procedure: Left/Right Heart Catheterization;  Surgeon: Neal Dy, MD;  Location: [facility] CATH;  Service: Cardiology   ??? PR DECORTICATION,PULMONARY,TOTAL Right 01/13/2019    Procedure: Decortic Pulm (Separt Proc); Tot;  Surgeon: Evert Kohl, MD;  Location: MA[facility];  Service: Thoracic   ??? PR EGD FLEXIBLE FOREIGN BODY REMOVAL N/A 12/26/2018  Procedure: UGI ENDOSCOPY; W/REMOVAL FOREIGN BODY;  Surgeon: Vonda Antigua, MD;  Location: [facility];  Service: Gastroenterology   ??? PR LAP,ABLAT 1+ LIVER TUMOR(S),RADIOFREQ N/A 05/03/2018    Procedure: LAPAROSCOPY, SURGICAL, ABLATION OF 1 OR MORE LIVER TUMOR(S); RADIOFREQUENCY;  Surgeon: Particia Nearing, MD;  Location: MAIN OR [facility];  Service: Transplant   ??? PR PERQ DRAINAGE PLEURA INSERT CATH W/IMAGING N/A 01/11/2019 Procedure: PLEURAL DRAINAGE, PERC, W INSERTION OF INDWELLING CATHETER; W IMAGING GUIDANCE;  Surgeon: Jerelyn Charles, MD;  Location: BRONCH PROCEDURE LAB [facility];  Service: Pulmonary   ??? PR THORACENTESIS NEEDLE/CATH PLEURA W/IMAGING N/A 01/06/2019    Procedure: THORACENTESIS W/ IMAGING;  Surgeon: Cleora Fleet, MD;  Location: BRONCH PROCEDURE LAB [facility];  Service: Pulmonary   ??? PR THORACOSCOPY SURG W/PLEURODESIS Right 01/13/2019    Procedure: THORACOSCOPY, SURGICAL; WITH PLEURODESIS (EG, MECHANICAL OR CHEMICAL);  Surgeon: Evert Kohl, MD;  Location: MAIN OR [facility];  Service: Thoracic   ??? PR TRANSPLANT LIVER,ALLOTRANSPLANT Bilateral 09/16/2018    Procedure: LIVER ALLOTRANSPLANTATION; ORTHOTOPIC, PARTIAL OR WHOLE, FROM CADAVER OR LIVING DONOR, ANY AGE;  Surgeon: Florene Glen, MD;  Location: MAIN OR [facility]  Service: Transplant   ??? PR TRANSPLANT,PREP DONOR LIVER, WHOLE N/A 09/16/2018    Procedure: [facility] STD PREP CAD DONOR WHOLE LIVER GFT PRIOR TNSPLNT,INC CHOLE,DISS/REM SURR TISSU WO TRISEG/LOBE SPLT;  Surgeon: Florene Glen, MD;  Location: MAIN OR [place];  Service: Transplant     Family History   Problem Relation Age of Onset   ??? Asthma Mother    ??? COPD Father    ??? Cancer Father    ??? Autoimmune disease Sister         AIH   ??? Heart disease Sister    ??? Colon cancer Maternal Grandfather        Social History:  He reports that he has never smoked. He has never used smokeless tobacco. He reports previous alcohol use. He reports that he does not use drugs.    Allergies as of 02/01/2019 - Reviewed 01/31/2019   Allergen Reaction Noted   ??? Watermelon flavor  04/29/2016      Current Outpatient Medications   Medication Sig Dispense Refill   ??? acetaminophen (TYLENOL) 325 MG tablet Take 2 tablets (650 mg total) by mouth every six (6) hours as needed for pain or fever (> 38C or 100.4F). 100 tablet 0 ??? alendronate (FOSAMAX) 70 MG tablet Take 1 tablet (70 mg total) by mouth every seven (7) days. (Patient taking differently: Take 70 mg by mouth every seven (7) days. Takes on sunday) 4 tablet 11   ??? amLODIPine (NORVASC) 5 MG tablet Take 2 tablets (10 mg total) by mouth daily. 30 tablet 11   ??? aspirin (ECOTRIN) 81 MG tablet Take 1 tablet (81 mg total) by mouth daily. 30 tablet 11   ??? azelastine (ASTELIN) 137 mcg (0.1 %) nasal spray 1 spray by Each Nare route daily as needed.      ??? blood sugar diagnostic Strp Use as directed Three (3) times a day before meals. 90 each 11   ??? calcium carbonate (TUMS) 200 mg calcium (500 mg) chewable tablet Chew 1 tablet Three (3) times a day as needed for heartburn.      ??? cholecalciferol, vitamin D3, 1,000 unit (25 mcg) tablet Take 2 tablets (2,000 Units total) by mouth daily. 60 tablet 11   ??? docusate sodium (COLACE) 100 MG capsule Take 1 capsule (100 mg total) by mouth two (  2) times a day as needed for constipation. 60 capsule 0   ??? fluticasone propionate (FLONASE) 50 mcg/actuation nasal spray PLACE ONE OR TWO SPRAYS INTO BOTH NOSTRILS DAILY AS NEEDED.     ??? furosemide (LASIX) 20 MG tablet Take 2 tablets (40 mg total) by mouth daily. 60 tablet 11   ??? gabapentin (NEURONTIN) 300 MG capsule Take 2 capsules (600 mg total) by mouth Three (3) times a day. 540 capsule 3   ??? glipiZIDE (GLUCOTROL XL) 5 MG 24 hr tablet      ??? lancets 33 gauge Misc 1 each by Miscellaneous route Three (3) times a day before meals. 100 each 11   ??? levothyroxine (SYNTHROID) 150 MCG tablet Take 1 tablet (150 mcg total) by mouth daily. 30 tablet 11   ??? lidocaine (LIDODERM) 5 % patch Place 1 patch on the skin daily. Apply to affected area for 12 hours only each day (then remove patch) 30 patch 2 ??? magnesium oxide-Mg AA chelate (MAGNESIUM, AMINO ACID CHELATE,) 133 mg Tab Take 1 tablet by mouth Two (2) times a day. HOLD until directed to start by your coordinator. (Patient not taking: Reported on 01/05/2019) 60 tablet 11   ??? mycophenolate (MYFORTIC) 180 MG EC tablet Take 2 tablets (360 mg total) by mouth Two (2) times a day. 120 tablet 11   ??? polyethylene glycol (GLYCOLAX) 17 gram/dose powder Mix 1 capful (17g) in 4 to 8 ounces water, juice, soda, coffee, or tea and drink daily as needed. 510 g 0   ??? sertraline (ZOLOFT) 25 MG tablet Take 1 tablet (25 mg total) by mouth daily. 30 tablet 11   ??? sulfamethoxazole-trimethoprim (BACTRIM) 400-80 mg per tablet Take 1 tablet (80 mg of trimethoprim total) by mouth 3 (three) times a week. 12 tablet 5   ??? tacrolimus (PROGRAF) 0.5 MG capsule Take 4 capsules (2 mg total) by mouth two (2) times a day. 240 capsule 11     No current facility-administered medications for this visit.      Facility-Administered Medications Ordered in Other Visits   Medication Dose Route Frequency Provider Last Rate Last Dose   ??? diazePAM (VALIUM) 5 mg/mL injection              Review of Systems:  See above     Objective:      PHYSICAL EXAM:    VITALS: There were no vitals filed for this visit.    GENERAL:  The patient appears to be in no distress. The patient is pleasant and interactive. Patient is a good historian.  No evidence of sedation.  HEENT:    Atraumatic.    RESPIRATORY:   Normal work of breathing.  No supplemental O2.  GASTROINTESTINAL:   Deferred.  NEUROLOGIC:    The patient was alert and oriented, speech fluent, normal language.   MUSCULOSKELETAL:    Deferred.  SKIN:   Deferred.  PSYCHIATRIC:  Appropriate mood and affect.  No pressured speech.       I have reviewed the patient's medical records today, imaging reports, images or laboratory studies.    Documentation assistance was provided by Judd Lien, Scribe, on February 01, 2019 at 9:06 AM for Dr. Clent Jacks, MD. ----------------------------------------------------------------------------------------------------------------------  February 01, 2019 11:05 AM. Documentation assistance provided by the Scribe. I was present during the time the encounter was recorded. The information recorded by the Scribe was done at my direction and has been reviewed and validated by me.  ----------------------------------------------------------------------------------------------------------------------

## 2019-02-02 NOTE — Unmapped (Signed)
PA thru CoverMyMeds Joniel Mallick Key: [id number] ??? Rx #: [id number]  Status Additional Information Required  Drug Lidocaine 5% patches  [employer] [place] [employer] Electronic Request Form (CB)  Original Claim Info  75 PA REQUIRED--QUANTITY LIMIT MAY APPLY

## 2019-02-03 LAB — TACROLIMUS BLOOD: Tacrolimus:MCnc:Pt:Bld:Qn:LC/MS/MS: 5.2

## 2019-02-07 DIAGNOSIS — Z944 Liver transplant status: Secondary | ICD-10-CM | POA: Diagnosis not present

## 2019-02-07 DIAGNOSIS — E612 Magnesium deficiency: Secondary | ICD-10-CM | POA: Diagnosis not present

## 2019-02-07 DIAGNOSIS — Z79899 Other long term (current) drug therapy: Secondary | ICD-10-CM | POA: Diagnosis not present

## 2019-02-07 MED FILL — MYCOPHENOLATE SODIUM 180 MG TABLET,DELAYED RELEASE: 30 days supply | Qty: 120 | Fill #5 | Status: AC

## 2019-02-07 MED FILL — TACROLIMUS 0.5 MG CAPSULE: 30 days supply | Qty: 240 | Fill #0 | Status: AC

## 2019-02-07 MED FILL — SULFAMETHOXAZOLE 400 MG-TRIMETHOPRIM 80 MG TABLET: 28 days supply | Qty: 12 | Fill #4 | Status: AC

## 2019-02-07 MED FILL — ALENDRONATE 70 MG TABLET: ORAL | 28 days supply | Qty: 4 | Fill #2

## 2019-02-07 MED FILL — ALENDRONATE 70 MG TABLET: 28 days supply | Qty: 4 | Fill #2 | Status: AC

## 2019-02-07 MED FILL — SULFAMETHOXAZOLE 400 MG-TRIMETHOPRIM 80 MG TABLET: ORAL | 28 days supply | Qty: 12 | Fill #4

## 2019-02-07 MED FILL — MYCOPHENOLATE SODIUM 180 MG TABLET,DELAYED RELEASE: ORAL | 30 days supply | Qty: 120 | Fill #5

## 2019-02-08 LAB — BILIRUBIN DIRECT: Bilirubin.glucuronidated+Bilirubin.albumin bound:MCnc:Pt:Ser/Plas:Qn:: 0.09

## 2019-02-08 LAB — GLOBULIN, TOTAL: Globulin:MCnc:Pt:Ser:Qn:Calculated: 1.9

## 2019-02-08 LAB — COMPREHENSIVE METABOLIC PANEL
A/G RATIO: 2.5 — ABNORMAL HIGH (ref 1.2–2.2)
ALBUMIN: 4.8 g/dL (ref 3.8–4.9)
ALKALINE PHOSPHATASE: 142 IU/L — ABNORMAL HIGH (ref 39–117)
ALT (SGPT): 15 IU/L (ref 0–44)
AST (SGOT): 15 IU/L (ref 0–40)
BILIRUBIN TOTAL: 0.3 mg/dL (ref 0.0–1.2)
BLOOD UREA NITROGEN: 28 mg/dL — ABNORMAL HIGH (ref 6–24)
BUN / CREAT RATIO: 24 — ABNORMAL HIGH (ref 9–20)
CALCIUM: 9.7 mg/dL (ref 8.7–10.2)
CHLORIDE: 104 mmol/L (ref 96–106)
CO2: 19 mmol/L — ABNORMAL LOW (ref 20–29)
CREATININE: 1.19 mg/dL (ref 0.76–1.27)
GLOBULIN, TOTAL: 1.9 g/dL (ref 1.5–4.5)
GLUCOSE: 97 mg/dL (ref 65–99)
POTASSIUM: 5.6 mmol/L — ABNORMAL HIGH (ref 3.5–5.2)

## 2019-02-08 LAB — CBC W/ DIFFERENTIAL
BANDED NEUTROPHILS ABSOLUTE COUNT: 0 10*3/uL (ref 0.0–0.1)
BASOPHILS ABSOLUTE COUNT: 0.1 10*3/uL (ref 0.0–0.2)
BASOPHILS RELATIVE PERCENT: 1 %
EOSINOPHILS ABSOLUTE COUNT: 0.4 10*3/uL (ref 0.0–0.4)
HEMATOCRIT: 38.2 % (ref 37.5–51.0)
HEMOGLOBIN: 12.1 g/dL — ABNORMAL LOW (ref 13.0–17.7)
IMMATURE GRANULOCYTES: 0 %
LYMPHOCYTES ABSOLUTE COUNT: 2 10*3/uL (ref 0.7–3.1)
LYMPHOCYTES RELATIVE PERCENT: 21 %
MEAN CORPUSCULAR HEMOGLOBIN CONC: 31.7 g/dL (ref 31.5–35.7)
MEAN CORPUSCULAR VOLUME: 82 fL (ref 79–97)
MONOCYTES ABSOLUTE COUNT: 0.8 10*3/uL (ref 0.1–0.9)
MONOCYTES RELATIVE PERCENT: 9 %
NEUTROPHILS ABSOLUTE COUNT: 6.2 10*3/uL (ref 1.4–7.0)
NEUTROPHILS RELATIVE PERCENT: 64 %
PLATELET COUNT: 285 10*3/uL (ref 150–450)
RED BLOOD CELL COUNT: 4.65 x10E6/uL (ref 4.14–5.80)
RED CELL DISTRIBUTION WIDTH: 15.5 % — ABNORMAL HIGH (ref 11.6–15.4)
WHITE BLOOD CELL COUNT: 9.5 10*3/uL (ref 3.4–10.8)

## 2019-02-08 LAB — LYMPHOCYTES ABSOLUTE COUNT: Lymphocytes:NCnc:Pt:Bld:Qn:Automated count: 2

## 2019-02-08 LAB — GAMMA GLUTAMYL TRANSFERASE: Gamma glutamyl transferase:CCnc:Pt:Ser/Plas:Qn:: 61

## 2019-02-08 LAB — MAGNESIUM: Magnesium:MCnc:Pt:Ser/Plas:Qn:: 2

## 2019-02-08 LAB — PHOSPHORUS, SERUM: Phosphate:MCnc:Pt:Ser/Plas:Qn:: 4.3 — ABNORMAL HIGH

## 2019-02-08 NOTE — Unmapped (Signed)
Patient's 11/15 lab results with elevated K. Contacted patient who denied any sx of hyperkalemia, although he continues to experience some chest pain/sob from his pulm.procedure last month. Explained that he should seek urgent med.attn if these develop. Discussed importance of moderating high K foods in his diet and increasing hydration.He verbalized understanding.

## 2019-02-09 LAB — TACROLIMUS BLOOD: Tacrolimus:MCnc:Pt:Bld:Qn:LC/MS/MS: 5

## 2019-02-10 DIAGNOSIS — Z79899 Other long term (current) drug therapy: Principal | ICD-10-CM

## 2019-02-10 DIAGNOSIS — Z944 Liver transplant status: Principal | ICD-10-CM

## 2019-02-10 MED ORDER — TACROLIMUS 0.5 MG CAPSULE
ORAL_CAPSULE | Freq: Two times a day (BID) | ORAL | 11 refills | 30 days | Status: CP
Start: 2019-02-10 — End: ?

## 2019-02-10 NOTE — Unmapped (Signed)
Patient's tac level still subpar. Reviewed with txp pharmacist, Cleone Slim, who recommended increasing his dose to 2.5mg  bid. Patient verbalized understanding and will continue weekly lab monitoring.

## 2019-02-12 NOTE — Unmapped (Signed)
Tacrolimus dose increase   Last filled 02/07/2019  We will re-test 02/26/2018 and route at that time, if patient will need beforehand just let us know and we will see if insurance allows an override

## 2019-02-14 DIAGNOSIS — E612 Magnesium deficiency: Secondary | ICD-10-CM | POA: Diagnosis not present

## 2019-02-14 DIAGNOSIS — Z944 Liver transplant status: Secondary | ICD-10-CM | POA: Diagnosis not present

## 2019-02-14 DIAGNOSIS — Z79899 Other long term (current) drug therapy: Secondary | ICD-10-CM | POA: Diagnosis not present

## 2019-02-15 LAB — COMPREHENSIVE METABOLIC PANEL
ALBUMIN: 4.6 g/dL (ref 3.8–4.9)
ALKALINE PHOSPHATASE: 129 IU/L — ABNORMAL HIGH (ref 39–117)
ALT (SGPT): 14 IU/L (ref 0–44)
AST (SGOT): 16 IU/L (ref 0–40)
BILIRUBIN TOTAL: 0.3 mg/dL (ref 0.0–1.2)
BLOOD UREA NITROGEN: 23 mg/dL (ref 6–24)
BUN / CREAT RATIO: 23 — ABNORMAL HIGH (ref 9–20)
CALCIUM: 9 mg/dL (ref 8.7–10.2)
CHLORIDE: 106 mmol/L (ref 96–106)
CO2: 20 mmol/L (ref 20–29)
CREATININE: 1 mg/dL (ref 0.76–1.27)
GLOBULIN, TOTAL: 1.9 g/dL (ref 1.5–4.5)
GLUCOSE: 91 mg/dL (ref 65–99)
POTASSIUM: 4.7 mmol/L (ref 3.5–5.2)
SODIUM: 142 mmol/L (ref 134–144)
TOTAL PROTEIN: 6.5 g/dL (ref 6.0–8.5)

## 2019-02-15 LAB — CBC W/ DIFFERENTIAL
BANDED NEUTROPHILS ABSOLUTE COUNT: 0 10*3/uL (ref 0.0–0.1)
BASOPHILS ABSOLUTE COUNT: 0.1 10*3/uL (ref 0.0–0.2)
BASOPHILS RELATIVE PERCENT: 1 %
EOSINOPHILS ABSOLUTE COUNT: 0.6 10*3/uL — ABNORMAL HIGH (ref 0.0–0.4)
EOSINOPHILS RELATIVE PERCENT: 7 %
HEMATOCRIT: 38.3 % (ref 37.5–51.0)
IMMATURE GRANULOCYTES: 0 %
LYMPHOCYTES ABSOLUTE COUNT: 1.6 10*3/uL (ref 0.7–3.1)
LYMPHOCYTES RELATIVE PERCENT: 20 %
MEAN CORPUSCULAR HEMOGLOBIN CONC: 32.9 g/dL (ref 31.5–35.7)
MEAN CORPUSCULAR HEMOGLOBIN: 26.9 pg (ref 26.6–33.0)
MEAN CORPUSCULAR VOLUME: 82 fL (ref 79–97)
MONOCYTES ABSOLUTE COUNT: 0.8 10*3/uL (ref 0.1–0.9)
MONOCYTES RELATIVE PERCENT: 11 %
NEUTROPHILS ABSOLUTE COUNT: 4.8 10*3/uL (ref 1.4–7.0)
NEUTROPHILS RELATIVE PERCENT: 61 %
PLATELET COUNT: 210 10*3/uL (ref 150–450)
RED BLOOD CELL COUNT: 4.69 x10E6/uL (ref 4.14–5.80)
RED CELL DISTRIBUTION WIDTH: 16.3 % — ABNORMAL HIGH (ref 11.6–15.4)

## 2019-02-15 LAB — A/G RATIO: Albumin/Globulin:MRto:Pt:Ser/Plas:Qn:: 2.4 — ABNORMAL HIGH

## 2019-02-15 LAB — HEMOGLOBIN: Hemoglobin:MCnc:Pt:Bld:Qn:: 12.6 — ABNORMAL LOW

## 2019-02-15 LAB — MAGNESIUM: Magnesium:MCnc:Pt:Ser/Plas:Qn:: 1.9

## 2019-02-15 LAB — PHOSPHORUS, SERUM: Phosphate:MCnc:Pt:Ser/Plas:Qn:: 3.4

## 2019-02-15 LAB — BILIRUBIN DIRECT: Bilirubin.glucuronidated+Bilirubin.albumin bound:MCnc:Pt:Ser/Plas:Qn:: 0.11

## 2019-02-15 LAB — GAMMA GLUTAMYL TRANSFERASE: Gamma glutamyl transferase:CCnc:Pt:Ser/Plas:Qn:: 46

## 2019-02-16 LAB — TACROLIMUS BLOOD: Tacrolimus:MCnc:Pt:Bld:Qn:LC/MS/MS: 3.2

## 2019-02-20 DIAGNOSIS — Z79899 Other long term (current) drug therapy: Principal | ICD-10-CM

## 2019-02-20 DIAGNOSIS — Z944 Liver transplant status: Principal | ICD-10-CM

## 2019-02-20 DIAGNOSIS — E612 Magnesium deficiency: Secondary | ICD-10-CM | POA: Diagnosis not present

## 2019-02-20 LAB — CBC W/ DIFFERENTIAL
BANDED NEUTROPHILS ABSOLUTE COUNT: 0 10*3/uL (ref 0.0–0.1)
BASOPHILS ABSOLUTE COUNT: 0.1 10*3/uL (ref 0.0–0.2)
BASOPHILS RELATIVE PERCENT: 1 %
EOSINOPHILS ABSOLUTE COUNT: 0.4 10*3/uL (ref 0.0–0.4)
EOSINOPHILS RELATIVE PERCENT: 5 %
HEMOGLOBIN: 13.1 g/dL (ref 13.0–17.7)
IMMATURE GRANULOCYTES: 0 %
LYMPHOCYTES ABSOLUTE COUNT: 1.7 10*3/uL (ref 0.7–3.1)
LYMPHOCYTES RELATIVE PERCENT: 21 %
MEAN CORPUSCULAR VOLUME: 81 fL (ref 79–97)
MONOCYTES ABSOLUTE COUNT: 0.8 10*3/uL (ref 0.1–0.9)
MONOCYTES RELATIVE PERCENT: 10 %
NEUTROPHILS ABSOLUTE COUNT: 5 10*3/uL (ref 1.4–7.0)
NEUTROPHILS RELATIVE PERCENT: 63 %
PLATELET COUNT: 204 10*3/uL (ref 150–450)
RED BLOOD CELL COUNT: 4.86 x10E6/uL (ref 4.14–5.80)
RED CELL DISTRIBUTION WIDTH: 16.1 % — ABNORMAL HIGH (ref 11.6–15.4)
WHITE BLOOD CELL COUNT: 8 10*3/uL (ref 3.4–10.8)

## 2019-02-20 LAB — LYMPHOCYTES ABSOLUTE COUNT: Lymphocytes:NCnc:Pt:Bld:Qn:Automated count: 1.7

## 2019-02-20 MED ORDER — TACROLIMUS 0.5 MG CAPSULE
ORAL_CAPSULE | Freq: Two times a day (BID) | ORAL | 11 refills | 30 days | Status: CP
Start: 2019-02-20 — End: ?

## 2019-02-20 NOTE — Unmapped (Signed)
Patient's 12/22 tac level subpar. Contacted him and confirmed he was taking the increased dose of 2.5mg  bid. He stated the trough was reliable and denied missing any doses, saying he was on his way to repeat labs now. Discussed with txp pharmD Mincemoyer, who recommended he increase his dose to 3mg  bid. Called patient again and relayed dose change. He verbalized understanding and will continue weekly lab monitoring.

## 2019-02-20 NOTE — Unmapped (Signed)
Change in Tacrolimus dosage increase. Refill too soon until 02/22/2019. Will attempt test claim on 02/22/2019 date.

## 2019-02-21 LAB — TACROLIMUS BLOOD: Tacrolimus:MCnc:Pt:Bld:Qn:LC/MS/MS: 7.3

## 2019-02-21 LAB — MAGNESIUM: Magnesium:MCnc:Pt:Ser/Plas:Qn:: 1.8

## 2019-02-21 LAB — COMPREHENSIVE METABOLIC PANEL
A/G RATIO: 2 (ref 1.2–2.2)
ALBUMIN: 4.5 g/dL (ref 3.8–4.9)
ALKALINE PHOSPHATASE: 126 IU/L — ABNORMAL HIGH (ref 39–117)
ALT (SGPT): 23 IU/L (ref 0–44)
AST (SGOT): 17 IU/L (ref 0–40)
BLOOD UREA NITROGEN: 21 mg/dL (ref 6–24)
CALCIUM: 9.3 mg/dL (ref 8.7–10.2)
CO2: 19 mmol/L — ABNORMAL LOW (ref 20–29)
CREATININE: 1.06 mg/dL (ref 0.76–1.27)
GLOBULIN, TOTAL: 2.3 g/dL (ref 1.5–4.5)
GLUCOSE: 108 mg/dL — ABNORMAL HIGH (ref 65–99)
SODIUM: 142 mmol/L (ref 134–144)
TOTAL PROTEIN: 6.8 g/dL (ref 6.0–8.5)

## 2019-02-21 LAB — BILIRUBIN DIRECT: Bilirubin.glucuronidated+Bilirubin.albumin bound:MCnc:Pt:Ser/Plas:Qn:: 0.14

## 2019-02-21 LAB — GAMMA GLUTAMYL TRANSFERASE: Gamma glutamyl transferase:CCnc:Pt:Ser/Plas:Qn:: 43

## 2019-02-21 LAB — PHOSPHORUS, SERUM: Phosphate:MCnc:Pt:Ser/Plas:Qn:: 3.2

## 2019-02-21 LAB — BUN / CREAT RATIO: Urea nitrogen/Creatinine:MRto:Pt:Ser/Plas:Qn:: 20

## 2019-02-22 NOTE — Unmapped (Signed)
Change in Tacrolimus dosage increase. Co-pay $0.00.

## 2019-02-27 DIAGNOSIS — Z944 Liver transplant status: Secondary | ICD-10-CM | POA: Diagnosis not present

## 2019-02-27 DIAGNOSIS — E612 Magnesium deficiency: Secondary | ICD-10-CM | POA: Diagnosis not present

## 2019-02-27 DIAGNOSIS — Z79899 Other long term (current) drug therapy: Secondary | ICD-10-CM | POA: Diagnosis not present

## 2019-02-28 ENCOUNTER — Encounter: Admit: 2019-02-28 | Discharge: 2019-02-28 | Payer: MEDICARE

## 2019-02-28 DIAGNOSIS — Z944 Liver transplant status: Principal | ICD-10-CM

## 2019-02-28 DIAGNOSIS — J869 Pyothorax without fistula: Principal | ICD-10-CM

## 2019-02-28 DIAGNOSIS — Z942 Lung transplant status: Secondary | ICD-10-CM | POA: Diagnosis not present

## 2019-02-28 DIAGNOSIS — J90 Pleural effusion, not elsewhere classified: Secondary | ICD-10-CM | POA: Diagnosis not present

## 2019-02-28 DIAGNOSIS — E119 Type 2 diabetes mellitus without complications: Secondary | ICD-10-CM | POA: Diagnosis not present

## 2019-02-28 LAB — COMPREHENSIVE METABOLIC PANEL
A/G RATIO: 2 (ref 1.2–2.2)
ALBUMIN: 4.4 g/dL (ref 3.8–4.9)
ALT (SGPT): 16 IU/L (ref 0–44)
AST (SGOT): 17 IU/L (ref 0–40)
BILIRUBIN TOTAL: 0.4 mg/dL (ref 0.0–1.2)
BLOOD UREA NITROGEN: 19 mg/dL (ref 6–24)
BUN / CREAT RATIO: 17 (ref 9–20)
CALCIUM: 9.3 mg/dL (ref 8.7–10.2)
CHLORIDE: 104 mmol/L (ref 96–106)
CO2: 23 mmol/L (ref 20–29)
CREATININE: 1.11 mg/dL (ref 0.76–1.27)
GLUCOSE: 109 mg/dL — ABNORMAL HIGH (ref 65–99)
SODIUM: 139 mmol/L (ref 134–144)
TOTAL PROTEIN: 6.6 g/dL (ref 6.0–8.5)

## 2019-02-28 LAB — CBC W/ DIFFERENTIAL
BANDED NEUTROPHILS ABSOLUTE COUNT: 0.1 10*3/uL (ref 0.0–0.1)
BASOPHILS ABSOLUTE COUNT: 0.1 10*3/uL (ref 0.0–0.2)
BASOPHILS RELATIVE PERCENT: 1 %
EOSINOPHILS RELATIVE PERCENT: 4 %
HEMATOCRIT: 39.6 % (ref 37.5–51.0)
HEMOGLOBIN: 12.9 g/dL — ABNORMAL LOW (ref 13.0–17.7)
LYMPHOCYTES ABSOLUTE COUNT: 1.9 10*3/uL (ref 0.7–3.1)
LYMPHOCYTES RELATIVE PERCENT: 21 %
MEAN CORPUSCULAR HEMOGLOBIN CONC: 32.6 g/dL (ref 31.5–35.7)
MEAN CORPUSCULAR HEMOGLOBIN: 26.4 pg — ABNORMAL LOW (ref 26.6–33.0)
MEAN CORPUSCULAR VOLUME: 81 fL (ref 79–97)
MONOCYTES ABSOLUTE COUNT: 1 10*3/uL — ABNORMAL HIGH (ref 0.1–0.9)
MONOCYTES RELATIVE PERCENT: 11 %
NEUTROPHILS ABSOLUTE COUNT: 5.7 10*3/uL (ref 1.4–7.0)
PLATELET COUNT: 230 10*3/uL (ref 150–450)
RED BLOOD CELL COUNT: 4.88 x10E6/uL (ref 4.14–5.80)
RED CELL DISTRIBUTION WIDTH: 15.9 % — ABNORMAL HIGH (ref 11.6–15.4)
WHITE BLOOD CELL COUNT: 9.1 10*3/uL (ref 3.4–10.8)

## 2019-02-28 LAB — A/G RATIO: Albumin/Globulin:MRto:Pt:Ser/Plas:Qn:: 2

## 2019-02-28 LAB — MEAN CORPUSCULAR HEMOGLOBIN CONC: Erythrocyte mean corpuscular hemoglobin concentration:MCnc:Pt:RBC:Qn:Automated count: 32.6

## 2019-02-28 LAB — PHOSPHORUS, SERUM: Phosphate:MCnc:Pt:Ser/Plas:Qn:: 3.5

## 2019-02-28 LAB — TACROLIMUS BLOOD: Tacrolimus:MCnc:Pt:Bld:Qn:LC/MS/MS: 12.3

## 2019-02-28 LAB — BILIRUBIN DIRECT: Bilirubin.glucuronidated+Bilirubin.albumin bound:MCnc:Pt:Ser/Plas:Qn:: 0.12

## 2019-02-28 LAB — GAMMA GLUTAMYL TRANSFERASE: Gamma glutamyl transferase:CCnc:Pt:Ser/Plas:Qn:: 38

## 2019-02-28 LAB — MAGNESIUM: Magnesium:MCnc:Pt:Ser/Plas:Qn:: 1.6

## 2019-02-28 NOTE — Unmapped (Addendum)
[facility] Refill Coordination Note    Specialty Medication(s) to be Shipped:   Transplant:  mycophenolic acid 180mg  and tacrolimus 0.5mg     Other medication(s) to be shipped: furosemide, alendronate, smz-tmp, sertraline,     Anthony Alvarado, DOB: 1960/08/27  Phone: [phone] (home)       All above HIPAA information was verified with patient.     Was a Nurse, learning disability used for this call? No    Completed refill call assessment today to schedule patient's medication shipment from the [facility] [phone]).       Specialty medication(s) and dose(s) confirmed: Regimen is correct and unchanged.   Changes to medications: [name] reports no changes at this time.  Changes to insurance: No  Questions for the pharmacist: No    Confirmed patient received Welcome Packet with first shipment. The patient will receive a drug information handout for each medication shipped and additional FDA Medication Guides as required.       DISEASE/MEDICATION-SPECIFIC INFORMATION        N/A    SPECIALTY MEDICATION ADHERENCE     Medication Adherence    Patient reported X missed doses in the last month: 0  Specialty Medication: Mycophenolate 180mg   Patient is on additional specialty medications: Yes  Additional Specialty Medications: Tacrolimus 0.5mg   Patient Reported Additional Medication X Missed Doses in the Last Month: 0  Patient is on more than two specialty medications: No                Tacrolimus 0.5 mg: 13 days of medicine on hand   Mycophenolate 180 mg: 13 days of medicine on hand *    SHIPPING     Shipping address confirmed in Epic.     Delivery Scheduled: Yes, Expected medication delivery date: 03/09/2019.     Medication will be delivered via UPS to the prescription address in Epic WAM.    [name]   [facility] Specialty Pharmacist

## 2019-02-28 NOTE — Unmapped (Signed)
Thoracic Surgery Clinic Follow-up Note      Assessment and Plan    Anthony Alvarado is a 59 y.o. male with history of cryptogenic cirrhosis and hepatocellular carcinoma s/o OLT 09/16/2018, aortic valve stenosis, T2DM, and autoimmune cholangitis who is s/p right thoracotomy, decortication, and pleurodesis for recurrent R pleural effusion on 01/13/19. He is doing well at home, with incisional pain, shortness of breath, and energy level improving.    Plan:  - Counseled on weaning gabapentin  - Follow up in thoracic surgery clinic PRN      Subjective    Anthony Alvarado is a 59 y.o. male with a history of cryptogenic cirrhosis and hepatocellular carcinoma s/o OLT 09/16/2018, aortic valve stenosis, T2DM, and autoimmune cholangitis who is s/p right thoracotomy, decortication, and pleurodesis for recurrent R pleural effusion on 01/13/19. He returns today for postoperative follow up.    Since he was last seen in thoracic surgery clinic on 12/8, he reports that he was been doing better at home. His thoracotomy incisional pain is improving, though he is not sure if this can be attributed to gabapentin. Otherwise, reports that he is able to walk more now before becoming short of breath. Does describe fatigue after working in his garage for a couple of hours, but overall feels that his energy level is improving. Denies any recent fevers, chills, chest pain, abdominal pain. Able to tolerate PO, with good appetite and regular bowel function.      Objective    Physical Exam:  BP 143/83  - Pulse 71  - Temp 36.7 ??C (Temporal)  - Resp 18  - Ht 172.7 cm (5' 8)  - Wt 84.5 kg (186 lb 3.2 oz)  - SpO2 100%  - BMI 28.31 kg/m??   General: awake, alert and oriented x 3   HEENT: NCAT, MMM, sclera anicteric   Neck: Trachea midline   Chest: Non labored breathing on room air, CTAB.  CVS: Normal rate. Regular rhythm.   ABD: Soft, NT, ND  Ext: Warm and well perfused. No peripheral edema. Skin: Thoracotomy and chest tube incisions on right side well-healed without any erythema, fluctuance, or tenderness, nontender to palpation.    Data Review:     Imaging: Reviewed by thoracic team.     Chest x-ray 02/28/2019  FINDINGS:   ??  No focal consolidation or pulmonary edema.[name]  ??  Trace right pleural effusion. No pneumothorax.  ??  Stable prominent cardiomediastinal silhouette.   ??  IMPRESSION:  ??  No acute airspace disease.  ??  Trace right pleural effusion.

## 2019-03-01 MED ORDER — TACROLIMUS 0.5 MG CAPSULE
ORAL_CAPSULE | Freq: Two times a day (BID) | ORAL | 11 refills | 30 days
Start: 2019-03-01 — End: 2019-03-07

## 2019-03-01 NOTE — Unmapped (Signed)
Patient's 1/4 tac level high after increasing his dose on 12/18. Discussed with txp PharmD Mincemoyer who recommended decreasing his dose back to 2.5mg  bid. Spoke to patient who confirmed the trough was reliable and relayed dose change. He verbalized understanding and will continue weekly lab draws. He inquired if he would still be given IV sedation on [facility]. Let him know coordinator would investigate and arrange for this if not.

## 2019-03-02 ENCOUNTER — Encounter: Admit: 2019-03-02 | Discharge: 2019-03-03 | Payer: MEDICARE

## 2019-03-02 DIAGNOSIS — G8922 Chronic post-thoracotomy pain: Secondary | ICD-10-CM | POA: Diagnosis not present

## 2019-03-02 DIAGNOSIS — Z7984 Long term (current) use of oral hypoglycemic drugs: Secondary | ICD-10-CM | POA: Diagnosis not present

## 2019-03-02 DIAGNOSIS — Z7982 Long term (current) use of aspirin: Secondary | ICD-10-CM | POA: Diagnosis not present

## 2019-03-02 DIAGNOSIS — G4733 Obstructive sleep apnea (adult) (pediatric): Secondary | ICD-10-CM | POA: Diagnosis not present

## 2019-03-02 DIAGNOSIS — G8929 Other chronic pain: Secondary | ICD-10-CM | POA: Diagnosis not present

## 2019-03-02 DIAGNOSIS — E119 Type 2 diabetes mellitus without complications: Secondary | ICD-10-CM | POA: Diagnosis not present

## 2019-03-02 DIAGNOSIS — I10 Essential (primary) hypertension: Secondary | ICD-10-CM | POA: Diagnosis not present

## 2019-03-02 DIAGNOSIS — Z79899 Other long term (current) drug therapy: Secondary | ICD-10-CM | POA: Diagnosis not present

## 2019-03-02 NOTE — Unmapped (Signed)
I have reviewed and agree with this note by Al Corpus, PharmD, CPP.  I was the supervising physician of record.  . Buena Irish, MD

## 2019-03-02 NOTE — Unmapped (Signed)
[facility]  [address], [address]  [place], [place] [address]    I spent 8 minutes on the real-time audio and video with the patient. I spent an additional 12 minutes on pre- and post-visit activities.     The patient was physically located in [place] or a state in which I am permitted to provide care. The patient understood that s/he may incur co-pays and cost sharing, and agreed to the telemedicine visit. The visit was completed via phone and/or video, which was appropriate and reasonable under the circumstances given the patient's presentation at the time.    The patient has been advised of the potential risks and limitations of this mode of treatment (including, but not limited to, the absence of in-person examination) and has agreed to be treated using telemedicine. The patient's/patient's family's questions regarding telemedicine have been answered. No vitals or physical exam was performed but the previous exam was copied forward in this note for continuity.     If the phone/video visit was completed in an ambulatory setting, the patient has also been advised to contact their provider???s office for worsening conditions, and seek emergency medical treatment and/or call 911 if the patient deems either necessary.    Visit modifiers:   POS 02 and 95 (virtual visit with video)    -Location of patient during visit: [place]  -Location of provider: [facility]  -Names of all people present during visit: Al Corpus, PharmD, CPP, Georgiann Hahn    Pharmacist Chronic Pain Medication Management Visit Summary    Assessment:     No diagnosis found.    Anthony Alvarado is a 59 y.o. male who is being followed at the [facility] for right sided pain localized near his prior surgical incision s/p right thoracotomy (01/13/19) for decortication, and pleurodesis for a recurrent pleural effusion. Patient has a PMH significant for cryptogenic cirrhosis and hepatocellular carcinoma??s/p??OLT 09/16/2018. He also carries a past medical history including DM2 (diet controlled), HTN, OSA.   ??  At last visit, the patient reported pain localized to his right chest, adjacent to his thoracotomy incision.  Patient describes neuropathic-like symptoms near the surgical site.  It was thought that based on how close we are to his operative date his symptoms were primarily myofascial, however he may also have a neuropathic component.  He reported relief with gabapentin 300 mg 3 times daily and was recently increased to 600 mg 3 times daily.  We discussed potentially proceeding with an intercostal nerve block, but he would like to manage conservatively for the time being.  We recommended he trial lidocaine patch as prescribed those at that time.  We will see how well he does with increase gabapentin dosage and trialing lidocaine patches.  If his pain is not improved at follow-up we could consider adding Cymbalta.    At today's visit, Anthony Alvarado is reporting good  analgesia from medication regimen without significant adverse effects.  Today he reports that overall his pain has improved since last visit.  He continues to have pain over his incision site that is worst in the evenings, however he feels that it is manageable at this point and would like to avoid adding an additional medication at this time.  He has not taken any oxycodone for several weeks.  He is also working to wean gabapentin and is currently taking 600 mg BID.  He was advised to wean this slowly to avoid withdrawal.  His insurance would not cover prescription lidocaine patches and he has not been able to purchase OTC patches, although he plans to try them.  I advised him to avoid going out to the store at this time due to uncontrolled virus and to have a family member or friend pick them up for him.      The patient does  appear to be utilizing pain medications appropriately and does  report that the medications do improve patient's quality of life and functionality level.    Current Pain Medication Regimen:  Oxycodone 5 mg as needed -surgeon  Gabapentin 600 mg 3 times daily  Lidocaine patch OTC  APAP    Medication Monitoring:   Last pain medication agreement on file and signed on  N/A.  Last urine toxicology:  N/A.    [place] database was reviewed today and is appropriate:  Last fill date for oxycodone was 01/17/19.  There are no aberrant drug related behaviors observed.    Plan:     ?? Continue gabapentin 600 mg BID.  Pt trying to wean and advised to wean slowly decreasing by one capsule every 5-7 days.  ?? Continue Tylenol.  ?? Try OTC lidocaine patches when able to have friend or family member purchase for him.  ?? Urine toxicology screen is not due today.  ?? Treatment agreement renewal is not due today.   ?? Return to clinic to see clinical pharmacist or physician in 1 month.    Future Considerations:  Cymbalta  Intercostal nerve block    Requested Prescriptions      No prescriptions requested or ordered in this encounter       No orders of the defined types were placed in this encounter.      This visit was 8 minutes in duration and greater than 50% was spend in direct face to face counseling and coordination of care regarding pain medication management.    Al Corpus, PharmD, CPP  ______________________________________________________________________    Subjective:     Reason for Visit:  Medication management for Chronic Pain.    Attending Pain Physician/Last Visit Date:  Dr. Eustaquio Maize  on 02/01/19  Last Pain Visit Date: 02/01/19  Last Pain Visit Provider: Dr. Eustaquio Maize    Action at Last Visit:   - Continue oxycodone 5mg  PRN for severe pain per surgeon  - Continue gabapentin 600 mg TID  - Apply Lidocaine patches near surgical site  - Follow up on 03/15/19    Since Last Visit/History of Present Illness:   We last saw the patient in December, since that time patient reports pain is better.    In regards to medications currently taken for pain management the patient is tolerating these medications well and denies associated side effects none. Patient denies misuse, abuse or diversion of medications. Patient reports being stable on this medication regimen and thinks that the medications do improve patient's quality of life and do improve patient's functionality level. Patient reports that the patient is able to perform majority of ADLs on the current regimen.     Objective:     PAST MEDICAL HISTORY:    Active Ambulatory Problems     Diagnosis Date Noted   ??? Diabetes mellitus without complication (CMS-HCC) 05/09/2017   ??? Hypothyroidism 08/09/2006   ??? Cirrhosis of liver without ascites (CMS-HCC) 01/10/2018   ??? OSA (obstructive sleep apnea) 09/14/2011   ??? Essential hypertension 08/09/2006   ??? Encounter for pre-transplant evaluation for chronic liver disease 03/10/2018   ???  Pleural effusion 09/09/2018   ??? Pneumothorax after biopsy 09/11/2018   ??? Liver transplant recipient (CMS-HCC) 09/16/2018   ??? Allergic rhinitis 08/09/2006   ??? Arthralgia 10/25/2017   ??? Asthma 09/14/2011   ??? Bicuspid aortic valve 02/25/2011   ??? Flying phobia 09/18/2013   ??? Mucosal abnormality of stomach 09/27/2018   ??? Nephrolithiasis 07/28/2018   ??? Pure hypercholesterolemia 02/14/2007   ??? Umbilical hernia 08/14/2012   ??? Unspecified hearing loss 08/11/2006     Resolved Ambulatory Problems     Diagnosis Date Noted   ??? No Resolved Ambulatory Problems     Past Medical History:   Diagnosis Date   ??? Aortic valve stenosis    ??? Autoimmune cholangitis    ??? Carrier of hemochromatosis HFE gene mutation    ??? Cholestatic cirrhosis (CMS-HCC)    ??? Hepatic encephalopathy (CMS-HCC)    ??? Hypertension    ??? Sleep apnea    ??? Type 2 diabetes mellitus (CMS-HCC)        Outpatient Encounter Medications as of 03/02/2019   Medication Sig Dispense Refill   ??? acetaminophen (TYLENOL) 325 MG tablet Take 2 tablets (650 mg total) by mouth every six (6) hours as needed for pain or fever (> 38C or 100.36F). 100 tablet 0   ??? alendronate (FOSAMAX) 70 MG tablet Take 1 tablet (70 mg total) by mouth every seven (7) days. (Patient taking differently: Take 70 mg by mouth every seven (7) days. Takes on sunday) 4 tablet 11   ??? amLODIPine (NORVASC) 5 MG tablet Take 2 tablets (10 mg total) by mouth daily. 30 tablet 11   ??? aspirin (ECOTRIN) 81 MG tablet Take 1 tablet (81 mg total) by mouth daily. 30 tablet 11   ??? azelastine (ASTELIN) 137 mcg (0.1 %) nasal spray 1 spray by Each Nare route daily as needed.      ??? blood sugar diagnostic Strp Use as directed Three (3) times a day before meals. 90 each 11   ??? calcium carbonate (TUMS) 200 mg calcium (500 mg) chewable tablet Chew 1 tablet Three (3) times a day as needed for heartburn.      ??? cholecalciferol, vitamin D3, 1,000 unit (25 mcg) tablet Take 2 tablets (2,000 Units total) by mouth daily. 60 tablet 11   ??? docusate sodium (COLACE) 100 MG capsule Take 1 capsule (100 mg total) by mouth two (2) times a day as needed for constipation. 60 capsule 0   ??? fluticasone propionate (FLONASE) 50 mcg/actuation nasal spray PLACE ONE OR TWO SPRAYS INTO BOTH NOSTRILS DAILY AS NEEDED.     ??? furosemide (LASIX) 20 MG tablet Take 2 tablets (40 mg total) by mouth daily. 60 tablet 11   ??? gabapentin (NEURONTIN) 300 MG capsule Take 2 capsules (600 mg total) by mouth Three (3) times a day. 540 capsule 3   ??? glipiZIDE (GLUCOTROL XL) 5 MG 24 hr tablet      ??? lancets 33 gauge Misc 1 each by Miscellaneous route Three (3) times a day before meals. 100 each 11   ??? levothyroxine (SYNTHROID) 150 MCG tablet Take 1 tablet (150 mcg total) by mouth daily. 30 tablet 11   ??? lidocaine (LIDODERM) 5 % patch Place 1 patch on the skin daily. Apply to affected area for 12 hours only each day (then remove patch) 30 patch 2   ??? magnesium oxide-Mg AA chelate (MAGNESIUM, AMINO ACID CHELATE,) 133 mg Tab Take 1 tablet by mouth Two (2) times a day. HOLD until  directed to start by your coordinator. (Patient not taking: Reported on 01/05/2019) 60 tablet 11   ??? mycophenolate (MYFORTIC) 180 MG EC tablet Take 2 tablets (360 mg total) by mouth Two (2) times a day. 120 tablet 11   ??? [EXPIRED] polyethylene glycol (GLYCOLAX) 17 gram/dose powder Mix 1 capful (17g) in 4 to 8 ounces water, juice, soda, coffee, or tea and drink daily as needed. 510 g 0   ??? sertraline (ZOLOFT) 25 MG tablet Take 1 tablet (25 mg total) by mouth daily. 30 tablet 11   ??? sulfamethoxazole-trimethoprim (BACTRIM) 400-80 mg per tablet Take 1 tablet (80 mg of trimethoprim total) by mouth 3 (three) times a week. 12 tablet 5   ??? tacrolimus (PROGRAF) 0.5 MG capsule Take 6 capsules (3 mg total) by mouth two (2) times a day. 360 capsule 11     Facility-Administered Encounter Medications as of 03/02/2019   Medication Dose Route Frequency Provider Last Rate Last Dose   ??? diazePAM (VALIUM) 5 mg/mL injection                  Allergies  Allergies   Allergen Reactions   ??? Watermelon Flavor      Mouth itching       Physical Examination:  Vitals: There were no vitals filed for this visit.  General:  There is no evidence of sedation.  There are no overt pain behaviors observed.    Musculoskeletal:  Patient ambulates without an assistive device       Current view: Showing all answers    Legend:         Triggered a BPA  Scoring question                     [facility] Return Patient Questionnaire     Question 03/01/2019  9:13 AM EST - Filed by Patient    What is the reason for your visit? Post procedure assessment    Date of onset of your pain: 09/09/2018    Please rate your pain at its WORST in the past month. 4    Please rate your pain at its LEAST in the past month. 2    Please rate your pain as it is RIGHT NOW. 3    Please rate your pain on AVERAGE in the past month. 3    Please circle the location of your pain.     Please select the words that describe your pain. Aching     Sharp     Sore    How often do you have pain? Other    When is your pain the worst? Evenings    Which of the following have been negatively affected by your pain? Enjoyment of life     Normal work     Recreational activities     Standing    Since your last visit:     Have you had any of the following? Other    Do you have any new pain you would like to discuss with your doctor? No    How has your pain changed? Better    Are you currently taking any blood-thinners or anticoagulants? No    If you are on Pain Medication ??? Are you having any of the following? I am stable on my current medication regime    If you have had a procedure since your last visit, how much pain relief was obtained? None    If  you have had a procedure since your last visit, were there any complications?       General: Weight Loss/Gain     Night Sweats     Difficulty Sleeping    Cardiovascular: Shortness of Breath with Exertion    Gastrointestinal - (Intestinal): Diarrhea    Skin:       Endocrine (Hormonal System): Intolerance to heat or cold    Musculoskeletal System - (Muscles, Joints and Coverings): Back pain     Muscle Aches/Weakness    Neurologic: Tremors    Psychiatric: Depression     Anxiety

## 2019-03-06 DIAGNOSIS — J90 Pleural effusion, not elsewhere classified: Principal | ICD-10-CM

## 2019-03-06 DIAGNOSIS — E612 Magnesium deficiency: Secondary | ICD-10-CM | POA: Diagnosis not present

## 2019-03-06 DIAGNOSIS — Z79899 Other long term (current) drug therapy: Secondary | ICD-10-CM | POA: Diagnosis not present

## 2019-03-06 DIAGNOSIS — Z944 Liver transplant status: Secondary | ICD-10-CM | POA: Diagnosis not present

## 2019-03-06 NOTE — Unmapped (Signed)
Spoke w/ pt and confirmed d/t of NP appt, for 03/07/19 starting @ 11:00 w/ Dr. Oneida Alar    03/06/19@322 , LK

## 2019-03-06 NOTE — Unmapped (Signed)
Patient requested IV sedation prior to each of his upcoming MRI studies. Per records, he has received IV ativan (1-2mg ) with recent studies. He reports he has severe claustrophobia and has not been able to endure the study with just po medication. Reordered exams with IV sedation and notified TPA to reschedule for patient.

## 2019-03-07 ENCOUNTER — Encounter: Admit: 2019-03-07 | Discharge: 2019-03-08 | Payer: MEDICARE

## 2019-03-07 DIAGNOSIS — Z79899 Other long term (current) drug therapy: Principal | ICD-10-CM

## 2019-03-07 DIAGNOSIS — Z944 Liver transplant status: Principal | ICD-10-CM

## 2019-03-07 DIAGNOSIS — J90 Pleural effusion, not elsewhere classified: Principal | ICD-10-CM

## 2019-03-07 DIAGNOSIS — Z9889 Other specified postprocedural states: Secondary | ICD-10-CM | POA: Diagnosis not present

## 2019-03-07 DIAGNOSIS — E119 Type 2 diabetes mellitus without complications: Secondary | ICD-10-CM | POA: Diagnosis not present

## 2019-03-07 DIAGNOSIS — C220 Liver cell carcinoma: Secondary | ICD-10-CM | POA: Diagnosis not present

## 2019-03-07 DIAGNOSIS — K8309 Other cholangitis: Secondary | ICD-10-CM | POA: Diagnosis not present

## 2019-03-07 DIAGNOSIS — K7469 Other cirrhosis of liver: Secondary | ICD-10-CM | POA: Diagnosis not present

## 2019-03-07 LAB — COMPREHENSIVE METABOLIC PANEL
A/G RATIO: 2.1 (ref 1.2–2.2)
ALBUMIN: 4.5 g/dL (ref 3.8–4.9)
ALKALINE PHOSPHATASE: 121 IU/L — ABNORMAL HIGH (ref 39–117)
ALT (SGPT): 19 IU/L (ref 0–44)
BILIRUBIN TOTAL: 0.3 mg/dL (ref 0.0–1.2)
BLOOD UREA NITROGEN: 20 mg/dL (ref 6–24)
BUN / CREAT RATIO: 19 (ref 9–20)
CALCIUM: 9.4 mg/dL (ref 8.7–10.2)
CHLORIDE: 103 mmol/L (ref 96–106)
CO2: 22 mmol/L (ref 20–29)
GLOBULIN, TOTAL: 2.1 g/dL (ref 1.5–4.5)
GLUCOSE: 103 mg/dL — ABNORMAL HIGH (ref 65–99)
POTASSIUM: 5.6 mmol/L — ABNORMAL HIGH (ref 3.5–5.2)
TOTAL PROTEIN: 6.6 g/dL (ref 6.0–8.5)

## 2019-03-07 LAB — CBC W/ DIFFERENTIAL
BANDED NEUTROPHILS ABSOLUTE COUNT: 0.1 10*3/uL (ref 0.0–0.1)
BASOPHILS ABSOLUTE COUNT: 0.1 10*3/uL (ref 0.0–0.2)
BASOPHILS RELATIVE PERCENT: 1 %
EOSINOPHILS ABSOLUTE COUNT: 0.3 10*3/uL (ref 0.0–0.4)
EOSINOPHILS RELATIVE PERCENT: 4 %
HEMOGLOBIN: 13.4 g/dL (ref 13.0–17.7)
IMMATURE GRANULOCYTES: 1 %
LYMPHOCYTES ABSOLUTE COUNT: 1.8 10*3/uL (ref 0.7–3.1)
LYMPHOCYTES RELATIVE PERCENT: 21 %
MEAN CORPUSCULAR HEMOGLOBIN CONC: 32.2 g/dL (ref 31.5–35.7)
MEAN CORPUSCULAR HEMOGLOBIN: 26.7 pg (ref 26.6–33.0)
MEAN CORPUSCULAR VOLUME: 83 fL (ref 79–97)
MONOCYTES RELATIVE PERCENT: 10 %
NEUTROPHILS ABSOLUTE COUNT: 5.4 10*3/uL (ref 1.4–7.0)
NEUTROPHILS RELATIVE PERCENT: 63 %
PLATELET COUNT: 251 10*3/uL (ref 150–450)
RED BLOOD CELL COUNT: 5.02 x10E6/uL (ref 4.14–5.80)
RED CELL DISTRIBUTION WIDTH: 15.7 % — ABNORMAL HIGH (ref 11.6–15.4)

## 2019-03-07 LAB — BILIRUBIN DIRECT: Bilirubin.glucuronidated+Bilirubin.albumin bound:MCnc:Pt:Ser/Plas:Qn:: 0.11

## 2019-03-07 LAB — MAGNESIUM: Magnesium:MCnc:Pt:Ser/Plas:Qn:: 1.8

## 2019-03-07 LAB — MEAN CORPUSCULAR VOLUME: Erythrocyte mean corpuscular volume:EntVol:Pt:RBC:Qn:Automated count: 83

## 2019-03-07 LAB — GAMMA GLUTAMYL TRANSFERASE: Gamma glutamyl transferase:CCnc:Pt:Ser/Plas:Qn:: 37

## 2019-03-07 LAB — TACROLIMUS BLOOD: Tacrolimus:MCnc:Pt:Bld:Qn:LC/MS/MS: 11.2

## 2019-03-07 LAB — AST (SGOT): Aspartate aminotransferase:CCnc:Pt:Ser/Plas:Qn:: 18

## 2019-03-07 LAB — PHOSPHORUS, SERUM: Phosphate:MCnc:Pt:Ser/Plas:Qn:: 3.2

## 2019-03-07 MED ORDER — TACROLIMUS 0.5 MG CAPSULE
ORAL_CAPSULE | 11 refills | 0 days | Status: CP
Start: 2019-03-07 — End: ?
  Filled 2019-03-08: qty 270, 30d supply, fill #0

## 2019-03-07 NOTE — Unmapped (Signed)
Thoracic Surgery Clinic Follow-up Note      Assessment and Plan    Anthony Alvarado is a 59 y.o. male with history of cryptogenic cirrhosis and hepatocellular carcinoma s/p OLT 09/16/2018, aortic valve stenosis, T2DM, and autoimmune cholangitis who is s/p right thoracotomy, decortication, and pleurodesis for recurrent R pleural effusion on 01/13/19. He has continued to do well at home with no new issues.    Plan:  - Follow up in thoracic surgery clinic PRN  - Will follow up with patient's transplant team      Subjective    Anthony Alvarado is a 59 y.o. male with a history of cryptogenic cirrhosis and hepatocellular carcinoma s/o OLT 09/16/2018, aortic valve stenosis, T2DM, and autoimmune cholangitis who is s/p right thoracotomy, decortication, and pleurodesis for recurrent R pleural effusion on 01/13/19. He returns today for a postoperative visit.    Since he was last seen in thoracic surgery clinic on 1/5, he reports that he has been doing well at home. No new complaints. He is currently working on weaning his gabapentin and continues to see his pain medicine team. Denies any recent fevers, chills, chest pain, shortness of breath, or abdominal pain. Able to tolerate PO, with good appetite and regular bowel function.      Objective    Physical Exam:  BP 146/84  - Pulse 75  - Temp 36.9 ??C (Temporal)  - Resp 18  - Ht 172.7 cm (5' 8)  - Wt 85.5 kg (188 lb 6.4 oz)  - SpO2 100%  - BMI 28.65 kg/m??   General: awake, alert and oriented x 3   HEENT: NCAT, MMM, sclera anicteric, face mask in place   Neck: Trachea midline   Chest: Non labored breathing on room air, CTAB.  CVS: Normal rate. Regular rhythm.   ABD: Soft, NT, ND  Ext: Warm and well perfused. No peripheral edema.   Skin: Thoracotomy and chest tube incisions on right side well-healed without any erythema, fluctuance, or tenderness, nontender to palpation.    Data Review:     Imaging: Reviewed by thoracic team.     Chest x-ray 02/28/2019  FINDINGS: No focal consolidation or pulmonary edema.[name]  ??  Trace right pleural effusion. No pneumothorax.  ??  Stable prominent cardiomediastinal silhouette.   ??  IMPRESSION:  ??  No acute airspace disease.  ??  Trace right pleural effusion.

## 2019-03-08 MED FILL — TACROLIMUS 0.5 MG CAPSULE: 30 days supply | Qty: 270 | Fill #0 | Status: AC

## 2019-03-08 MED FILL — SERTRALINE 25 MG TABLET: 30 days supply | Qty: 30 | Fill #0 | Status: AC

## 2019-03-08 MED FILL — ALENDRONATE 70 MG TABLET: ORAL | 28 days supply | Qty: 4 | Fill #3

## 2019-03-08 MED FILL — MYCOPHENOLATE SODIUM 180 MG TABLET,DELAYED RELEASE: 30 days supply | Qty: 120 | Fill #6 | Status: AC

## 2019-03-08 MED FILL — MYCOPHENOLATE SODIUM 180 MG TABLET,DELAYED RELEASE: ORAL | 30 days supply | Qty: 120 | Fill #6

## 2019-03-08 MED FILL — SERTRALINE 25 MG TABLET: ORAL | 30 days supply | Qty: 30 | Fill #0

## 2019-03-08 MED FILL — ALENDRONATE 70 MG TABLET: 28 days supply | Qty: 4 | Fill #3 | Status: AC

## 2019-03-08 MED FILL — FUROSEMIDE 20 MG TABLET: 30 days supply | Qty: 60 | Fill #0 | Status: AC

## 2019-03-08 NOTE — Unmapped (Addendum)
Patient's tac level supratherapeutic after decreasing his dose to 2.5mg  bid on 1/6. K also elevated to 5.6. Confirmed with patient that he was taking the correct dose, the trough was valid, and encouraged him to look closely at his K intake and maintain adequate hydration. Messaged txp PharmD Mincemoyer who recommended patient take 2.5mg /2mg  daily. Spoke to patient and relayed dose change. He verbalized understanding.[name]

## 2019-03-08 NOTE — Unmapped (Signed)
Change in Tacrolimius dosage increase. Co-pay $10.00.

## 2019-03-11 NOTE — Unmapped (Signed)
Called pt to discuss scheduling MRI w sedation for 1/25 6 mo appt. Explained there was no availability that day but he agreed to have it the next day 1/26 at 9:45. Told him that 46mo appt in April MRI w sedation had been rescheduled to same day and that going forward all MRIs will be scheduled with sedation. Pt was surprised nothing 'pops up' saying he needs sedation with MRIs but he verbalized understanding of all discussed.

## 2019-03-14 DIAGNOSIS — E612 Magnesium deficiency: Secondary | ICD-10-CM | POA: Diagnosis not present

## 2019-03-14 DIAGNOSIS — Z79899 Other long term (current) drug therapy: Secondary | ICD-10-CM | POA: Diagnosis not present

## 2019-03-14 DIAGNOSIS — Z944 Liver transplant status: Secondary | ICD-10-CM | POA: Diagnosis not present

## 2019-03-15 ENCOUNTER — Ambulatory Visit: Admit: 2019-03-15 | Discharge: 2019-03-16 | Payer: MEDICARE | Attending: Family | Primary: Family

## 2019-03-15 DIAGNOSIS — Z20828 Contact with and (suspected) exposure to other viral communicable diseases: Secondary | ICD-10-CM | POA: Diagnosis not present

## 2019-03-15 LAB — COMPREHENSIVE METABOLIC PANEL
A/G RATIO: 2.4 — ABNORMAL HIGH (ref 1.2–2.2)
ALBUMIN: 4.7 g/dL (ref 3.8–4.9)
ALT (SGPT): 20 IU/L (ref 0–44)
AST (SGOT): 19 IU/L (ref 0–40)
BILIRUBIN TOTAL: 0.2 mg/dL (ref 0.0–1.2)
BLOOD UREA NITROGEN: 21 mg/dL (ref 6–24)
BUN / CREAT RATIO: 18 (ref 9–20)
CALCIUM: 9.1 mg/dL (ref 8.7–10.2)
CHLORIDE: 105 mmol/L (ref 96–106)
CO2: 23 mmol/L (ref 20–29)
CREATININE: 1.18 mg/dL (ref 0.76–1.27)
GLOBULIN, TOTAL: 2 g/dL (ref 1.5–4.5)
GLUCOSE: 116 mg/dL — ABNORMAL HIGH (ref 65–99)
POTASSIUM: 5.1 mmol/L (ref 3.5–5.2)
SODIUM: 140 mmol/L (ref 134–144)

## 2019-03-15 LAB — CBC W/ DIFFERENTIAL
BANDED NEUTROPHILS ABSOLUTE COUNT: 0 10*3/uL (ref 0.0–0.1)
BASOPHILS RELATIVE PERCENT: 1 %
EOSINOPHILS RELATIVE PERCENT: 3 %
HEMATOCRIT: 43.1 % (ref 37.5–51.0)
HEMOGLOBIN: 13.8 g/dL (ref 13.0–17.7)
IMMATURE GRANULOCYTES: 0 %
LYMPHOCYTES ABSOLUTE COUNT: 1.3 10*3/uL (ref 0.7–3.1)
LYMPHOCYTES RELATIVE PERCENT: 22 %
MEAN CORPUSCULAR HEMOGLOBIN CONC: 32 g/dL (ref 31.5–35.7)
MEAN CORPUSCULAR HEMOGLOBIN: 26.7 pg (ref 26.6–33.0)
MEAN CORPUSCULAR VOLUME: 84 fL (ref 79–97)
MONOCYTES ABSOLUTE COUNT: 0.7 10*3/uL (ref 0.1–0.9)
MONOCYTES RELATIVE PERCENT: 12 %
NEUTROPHILS ABSOLUTE COUNT: 3.5 10*3/uL (ref 1.4–7.0)
NEUTROPHILS RELATIVE PERCENT: 62 %
PLATELET COUNT: 184 10*3/uL (ref 150–450)
RED BLOOD CELL COUNT: 5.16 x10E6/uL (ref 4.14–5.80)
RED CELL DISTRIBUTION WIDTH: 15.4 % (ref 11.6–15.4)

## 2019-03-15 LAB — ALT (SGPT): Alanine aminotransferase:CCnc:Pt:Ser/Plas:Qn:: 20

## 2019-03-15 LAB — BILIRUBIN DIRECT: Bilirubin.glucuronidated+Bilirubin.albumin bound:MCnc:Pt:Ser/Plas:Qn:: 0.08

## 2019-03-15 LAB — PHOSPHORUS, SERUM: Phosphate:MCnc:Pt:Ser/Plas:Qn:: 2.9

## 2019-03-15 LAB — GAMMA GLUTAMYL TRANSFERASE: Gamma glutamyl transferase:CCnc:Pt:Ser/Plas:Qn:: 55

## 2019-03-15 LAB — MAGNESIUM: Magnesium:MCnc:Pt:Ser/Plas:Qn:: 1.7

## 2019-03-15 LAB — IMMATURE GRANULOCYTES: Granulocytes.immature/100 leukocytes:NFr:Pt:Bld:Qn:Automated count: 0

## 2019-03-15 NOTE — Unmapped (Signed)
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   If Yes: Student is symptomatic: N/A    Student is a [employer] [facility] employee/volunteer: N/A      Patient is experiencing symptoms severe enough that you would like to speak with a provider: No (Symptomatic)      Patient is calling to schedule a COVID test: Yes -- Asked additional question below before attempting to schedule  If Yes: Patient has tested positive in the last 90 days for COVID-19: No -- Attempted to schedule      Advised If you think you might have COVID-19 or might have been exposed to COVID-19, you should practice physical distancing and other measures to reduce disease spread. We do recommend that you and everyone in your house stay at home as much as possible.  Keep six (6) feet of distance between you and other people. Wash your hands frequently and avoid touching your face.  The CDC now recommends wearing cloth face coverings or masks in public settings where other physical distancing measures are difficult to maintain, such as grocery stores and pharmacies. Call your PCP or the COVID Help Line [phone]) from 7a-7p with new or worsening symptoms.      The call was resolved in the following manner: COVID testing scheduled

## 2019-03-15 NOTE — Unmapped (Signed)
Assessment     Anthony Alvarado is a 59 y.o. male presenting to [facility] for COVID testing.     Problem List Items Addressed This Visit     None      Visit Diagnoses     Contact with or exposure to viral disease    -  Primary    Relevant Orders    COVID-19 PCR          Plan     If no testing performed, pt counseled on routine care for respiratory illness.  If testing performed, COVID sent.  Patient directed to Home given findings during today's visit.    Subjective     Anthony Alvarado is a 59 y.o. male who presents [facility] the [facility] with complaints of the following:    Exposure History: In the last 21 days?     Have you traveled outside of [place]? No               Have you been in close contact with someone confirmed by a test to have COVID? (Close contact is within 6 feet for at least 10 minutes) No       Have you worked in a health care facility? No     Lived or worked facility like a nursing home, group home, or assisted living?    No         Are you scheduled to have surgery or a procedure in the next 3 days? No               Are you scheduled to receive cancer chemotherapy within the next 7 days?    No     Have you ever been tested before for COVID-19 with a swab of your nose? Yes: When: 11/20, Where: here   Are you a healthcare worker being tested so to return to work No         Right now,  do you have any of the following that developed over the past 7 days (as stated by patient on intake form):    Subjective fever (felt feverish) No   Chills (especially repeated shaking chills) No   Severe fatigue (felt very tired) Yes, how many days? 15   Muscle aches No   Runny nose No   Sore throat Yes, how many days? n/a   Loss of taste or smell Yes, how many days? 5   Cough (new onset or worsening of chronic cough) No   Shortness of breath No   Nausea or vomiting No   Headache No   Abdominal Pain No   Diarrhea (3 or more loose stools in last 24 hours) No History/Medical Conditions (as stated by patient on intake form):    Do you have any of the following:   Asthma or emphysema or COPD Yes   Cystic Fibrosis No   Diabetes Yes   High Blood Pressure  Yes   Cardiovascular Disease Yes   Chronic Kidney Disease No   Chronic Liver Disease No   Chronic blood disorder like Sickle Cell Disease  No   Weak immune system due to disease or medication Yes   Neurologic condition that limits movement  No   Developmental delay - Moderate to Severe  No   Recent (within past 2 weeks) or current Pregnancy No   Morbid Obesity (>100 pounds over ideal weight) No   Current Smoker No   Former Smoker No  Objective     Given above, testing performed: Yes    Testing Performed:  Test Specimen Type Sent to   COVID-19  NP Swab [facility]       Scribe's Attestation:  Colin Rhein, FNP, obtained and performed the history, physical exam and medical decision making elements that were entered into the chart.  Signed by Salomon Mast serving as Scribe, on 03/15/2019 2:01 PM      The documentation recorded by the scribe accurately reflects the service I personally performed and the decisions made by me. Aida Puffer, FNP  March 15, 2019 3:54 PM

## 2019-03-15 NOTE — Unmapped (Signed)
Patient left VM for coordinator explaining that he has developed a cold and wondered if he would need covid testing before his appt on Monday. Spoke with patient, who reported having a sore throat the last few days, mild cough, nasal congestion, occasional diarrhea, some altered taste sensation, and possible muscle aches, though he has chronic pain. He denied fever/chills or sob. Since there are some overlapping sx with covid infection, gave patient the number for the [facility] for testing before his upcoming appt. Encouraged him to increase his hydration and update coordinator after his testing, and if he receives results locally. He verbalized agreement.

## 2019-03-16 ENCOUNTER — Encounter: Admit: 2019-03-16 | Discharge: 2019-03-17 | Payer: MEDICARE

## 2019-03-16 DIAGNOSIS — J988 Other specified respiratory disorders: Principal | ICD-10-CM

## 2019-03-16 DIAGNOSIS — U071 COVID-19: Principal | ICD-10-CM

## 2019-03-16 DIAGNOSIS — I10 Essential (primary) hypertension: Secondary | ICD-10-CM | POA: Diagnosis not present

## 2019-03-16 DIAGNOSIS — E119 Type 2 diabetes mellitus without complications: Secondary | ICD-10-CM | POA: Diagnosis not present

## 2019-03-16 LAB — TACROLIMUS BLOOD: Tacrolimus:MCnc:Pt:Bld:Qn:LC/MS/MS: 8.9

## 2019-03-16 NOTE — Unmapped (Signed)
Due to your recent positive COVID-19 result, [facility] has the capability to offer [facility] therapy.  The [place] FDA has issued an Emergency Use Authorization to permit the emergency use of the unapproved product BAM for the treatment of mild to moderate coronavirus disease 2019 (COVID19).      This treatment may help to prevent progression on to severe symptoms and/or hospitalization in those who are confirmed to be high-risk COVID (+) and are within 10 days of symptom onset. Could you tell me when your symptoms started 1/14.      Patient is 59 y.o. years old.   Patient is receiving immunosuppressive treatment(e.g., chemotherapy; rituximab; steroid use at .20 mg/day or .2 mg/kg/day prednisone or equivalent for . 14 days): yes and therefore is eligible for infusion     Advised patient that those receiving monoclonal or plasma products should wait 90 days until getting immunized.    Answered all questions utilizing  FAQ sheet and patient has accepted treatment therapy.  Inbasket message sent to [facility] and infusion center and encounter routed.  Chart reviewed, patient has has not been assessed and scheduled for [facility] visit on pending.     Current Outpatient Medications   Medication Sig Dispense Refill   ??? acetaminophen (TYLENOL) 325 MG tablet Take 2 tablets (650 mg total) by mouth every six (6) hours as needed for pain or fever (> 38C or 100.13F). 100 tablet 0   ??? alendronate (FOSAMAX) 70 MG tablet Take 1 tablet (70 mg total) by mouth every seven (7) days. (Patient not taking: Reported on 03/07/2019) 4 tablet 11   ??? amLODIPine (NORVASC) 5 MG tablet Take 2 tablets (10 mg total) by mouth daily. 30 tablet 11   ??? aspirin (ECOTRIN) 81 MG tablet Take 1 tablet (81 mg total) by mouth daily. 30 tablet 11   ??? azelastine (ASTELIN) 137 mcg (0.1 %) nasal spray 1 spray by Each Nare route daily as needed.      ??? blood sugar diagnostic Strp Use as directed Three (3) times a day before meals. 90 each 11 ??? calcium carbonate (TUMS) 200 mg calcium (500 mg) chewable tablet Chew 1 tablet Three (3) times a day as needed for heartburn.      ??? cholecalciferol, vitamin D3, 1,000 unit (25 mcg) tablet Take 2 tablets (2,000 Units total) by mouth daily. 60 tablet 11   ??? docusate sodium (COLACE) 100 MG capsule Take 1 capsule (100 mg total) by mouth two (2) times a day as needed for constipation. 60 capsule 0   ??? fluticasone propionate (FLONASE) 50 mcg/actuation nasal spray PLACE ONE OR TWO SPRAYS INTO BOTH NOSTRILS DAILY AS NEEDED.     ??? furosemide (LASIX) 20 MG tablet Take 2 tablets (40 mg total) by mouth daily. 60 tablet 11   ??? gabapentin (NEURONTIN) 300 MG capsule Take 2 capsules (600 mg total) by mouth Three (3) times a day. 540 capsule 3   ??? glipiZIDE (GLUCOTROL XL) 5 MG 24 hr tablet      ??? lancets 33 gauge Misc 1 each by Miscellaneous route Three (3) times a day before meals. 100 each 11   ??? levothyroxine (SYNTHROID) 150 MCG tablet Take 1 tablet (150 mcg total) by mouth daily. 30 tablet 11   ??? lidocaine (LIDODERM) 5 % patch Place 1 patch on the skin daily. Apply to affected area for 12 hours only each day (then remove patch) 30 patch 2   ??? magnesium oxide-Mg AA chelate (MAGNESIUM, AMINO ACID CHELATE,) 133 mg  Tab Take 1 tablet by mouth Two (2) times a day. HOLD until directed to start by your coordinator. 60 tablet 11   ??? mycophenolate (MYFORTIC) 180 MG EC tablet Take 2 tablets (360 mg total) by mouth Two (2) times a day. 120 tablet 11   ??? sertraline (ZOLOFT) 25 MG tablet Take 1 tablet (25 mg total) by mouth daily. 30 tablet 11   ??? sulfamethoxazole-trimethoprim (BACTRIM) 400-80 mg per tablet Take 1 tablet (80 mg of trimethoprim total) by mouth 3 (three) times a week. 12 tablet 5   ??? tacrolimus (PROGRAF) 0.5 MG capsule Take 5 capsules (2.5 mg total) by mouth in the morning AND 4 capsules (2 mg total) in the evening. 270 capsule 11     No current facility-administered medications for this visit. Facility-Administered Medications Ordered in Other Visits   Medication Dose Route Frequency Provider Last Rate Last Admin   ??? diazePAM (VALIUM) 5 mg/mL injection

## 2019-03-16 NOTE — Unmapped (Signed)
Patient tested yesterday for Covid with positive result. He was c/o mild sx then, but is at high risk for worsening progression as a txp patient with recent lung surgery. Notified Dr.Desai, who approved monoclonal referral as long as recommended by txp ID. Dr.Lachiewicz of whichever therapy he could obtain, adding that he should get a pulse ox, and be told to seek medical attn if his SPO2 drops below 94%. Sent referral Covid Antibody infusion team and msg to Dr.Haithcock, since patient had recent decortication and pleurodesis.Spoke with patient who said he had already been contacted by the [facility] re.his results and reported his sx were about the same, except his sore throat had resolved. He admitted feeling somewhat anxious, which may be making him feel somewhat sob. Encouraged him to relax, rest, hydrate and contact sister to pickup pulse ox for him. Instructed him to seek med attn r/t SPO2 parameters and contact coordinator if does not hear from the Covid team about the antibody infusion. He verbalized understanding. Notified [facility] to reschedule his next week appts for his 6 mos liver txp f/u.

## 2019-03-16 NOTE — Unmapped (Signed)
Patient referred for: COVID - [facility]    Outcome: Scheduled visit.    Scheduling Notes: wants BAM

## 2019-03-16 NOTE — Unmapped (Addendum)
As discussed you are a candidate for monoclonal antibody IV therapy for Covid-19 (bamlanivimab or casirivimab/imdevimab)  which is on EUA (Emergency Use Authorization). I have placed a referral to the Covid therapy coordinators and someone will be calling you.  In the meantime, please continue to isolate/quarantine. Stay home and do not go to work, school, the store, or any gatherings. If your symptoms worsen or you have questions or problems call the [facility] at [phone]  If your symptoms become severe and you cannot manage them at home, please call 911 or go directly to the Emergency Department.     Fact Sheet for Patients, Parents and Caregivers  Emergency Use Authorization (EUA) of Bamlanivimab for Coronavirus Disease 2019 (COVID-19)  You are being given a medicine called bamlanivimab for the treatment of coronavirus disease 2019 (COVID19). This Fact Sheet contains information to help you understand the potential risks and potential benefits of  taking bamlanivimab, which you may receive.  Receiving bamlanivimab may benefit certain people with COVID-19.  Read this Fact Sheet for information about bamlanivimab. Talk to your healthcare provider if you have  questions. It is your choice to receive bamlanivimab or stop it at any time.  What isCOVID-19?  COVID-19 is caused by a virus called a coronavirus. People can get COVID-19 through contact with  another person who has the virus.  COVID-19 illnesses have ranged from very mild (including some with no reported symptoms) to severe,  including illness resulting in death. While information so far suggests that most COVID-19 illness is mild,  serious illness can happen and may cause some of your other medical conditions to become worse. People  of all ages with severe, long-lasting (chronic) medical conditions like heart disease, lung disease, and  diabetes, for example, seem to be at higher risk of being hospitalized for COVID-19.  What are the symptoms of COVID-19?  The symptoms of COVID-19 include fever, cough, and shortness of breath, which may appear 2 to 14 days after  exposure. Serious illness including breathing problems can occur and may cause your other medical conditions to  become worse.  What isbamlanivimab?  Bamlanivimab is an investigational medicine used for the treatment of COVID-19 in non-hospitalized adults and  adolescents 14 years of age and older with mild to moderate symptoms who weigh 88 pounds (40 kg) or more,  and who are at high risk for developing severe COVID-19 symptoms or the need for hospitalization. Bamlanivimab  is investigational because it is still being studied. There is limited information known about the safety or  effectiveness of using bamlanivimab to treat people with COVID-19.  The FDA has authorized the emergency use of bamlanivimab for the treatment of COVID-19 under an Emergency  Use Authorization (EUA). For more information on EUA, see the section ???What is an Emergency Use  Authorization (EUA)???? at the end of this Fact Sheet.  What should I tell my healthcare provider before I receive bamlanivimab?  Tell your healthcare provider about all of your medical conditions, including if you:  ??? Have any allergies  ??? Are pregnant or plan to become pregnant  ??? Are breastfeeding or plan to breastfeed  ??? Have any serious illnesses  ??? Are taking any medications (prescription, over-the-counter, vitamins, and herbal products)  How will I receive bamlanivimab?  ??? Bamlanivimab is given to you through a vein (intravenous or IV) for at least 1 hour.  ??? You will receive one dose of bamlanivimab by IV infusion.  What are the important possible  side effects of bamlanivimab?  Possible side effects of bamlanivimab are:  ??? Allergic reactions. Allergic reactions can happen during and after infusion with bamlanivimab. Tell your  healthcare provider right away if you get any of the following signs and symptoms of allergic reactions: fever,  2 chills, nausea, headache, shortness of breath, low blood pressure, wheezing, swelling of your lips, face, or  throat, rash including hives, itching, muscle aches, and dizziness.  The side effects of getting any medicine by vein may include brief pain, bleeding, bruising of the skin, soreness,  swelling, and possible infection at the infusion site.  These are not all the possible side effects of bamlanivimab. Not a lot of people have been given bamlanivimab.  Serious and unexpected side effects may happen. Bamlanivimab is still being studied so it is possible that all of  the risks are not known at this time.  It is possible that bamlanivimab could interfere with your body's own ability to fight off a future infection of  SARS-CoV-2. Similarly, bamlanivimab may reduce your body???s immune response to a vaccine for SARS-CoV-2.  Specific studies have not been conducted to address these possible risks. Talk to your healthcare provider if you  have any questions.  What other treatment choices are there?  Like bamlanivimab, FDA may allow for the emergency use of other medicines to treat people with COVID-19. Go  to UKRank.es for information on the emergency use of other medicines that  are not approved by FDA to treat people with COVID-19. Your healthcare provider may talk with you about clinical  trials you may be eligible for.  It is your choice to be treated or not to be treated with bamlanivimab. Should you decide not to receive  bamlanivimab or stop it at any time, it will not change your standard medical care.  What if I am pregnant or breastfeeding?  There is limited experience treating pregnant women or breastfeeding mothers with bamlanivimab. For a mother  and unborn baby, the benefit of receiving bamlanivimab may be greater than the risk from the treatment. If you  are pregnant or breastfeeding, discuss your options and specific situation with your healthcare provider.  How do I report side effects with bamlanivimab?  Tell your healthcare provider right away if you have any side effect that bothers you or does not go away.  Report side effects to FDA MedWatch at [employer], call 1-800-FDA-1088, or contact [employer] at [employer] [phone]).  How can I learn more?  ??? Ask your healthcare provider  ??? Visit TVRaw.pl  ??? Visit UKRank.es  ??? Contact your local or state public health department  What is an Emergency Use Authorization (EUA)?  The [place] FDA has made bamlanivimab available under an emergency access mechanism called an  EUA. The EUA is supported by a Surveyor, minerals and Human Service (HHS) declaration that circumstances  exist to justify the emergency use of drugs and biological products during the COVID-19 pandemic.  Bamlanivimab has not undergone the same type of review as an FDA-approved or cleared product. The FDA may  issue an EUA when certain criteria are met, which includes that there are no adequate, approved, and available  alternatives. In addition, the FDA decision is based on the totality of scientific evidence available showing that it is  reasonable to believe that the product meets certain criteria for safety, performance, and labeling and may be  effective in treatment of patients during the COVID-19 pandemic. All of these  criteria must be met to allow for the  product to be used in the treatment of patients during the COVID-19 pandemic.  3  The EUA for bamlanivimab is in effect for the duration of the COVID-19 declaration justifying emergency use of  these products, unless terminated or revoked (after which the product may no longer be used).  Literature issued November 2020  [employer] and [place], [place], [place] [address], [place]  Copyright ?? 2020, [employer] and [employer]. All rights reserved.  5.0-BAM-0000-EUA [id number]

## 2019-03-16 NOTE — Unmapped (Signed)
TeleHealth Phone Encounter  This medical encounter was conducted virtually using [email or web address]  TeleHealth protocols.      I have identified myself to the patient and conveyed my credentials to Anthony Alvarado  In case we get disconnected, patient's phone number is [phone] (home)    Is there someone else in the room? No.      Assessment/Plan:      1. COVID-19        Disposition   Based upon the patient's age and comorbidity of immunosuppression, HTN, DM, patient's positive Covid-19 diagnosis,current clinical evaluation with mild to moderate symptoms, and length of symptoms less than 10 days they are an appropriate candidate for monoclonal antibody IV therapy for Covid-19 (bamlanivimab or casirivimab/imdevimab)  Additionally I feel it is appropriate to manage current symptoms supportively at home. Patient also instructed the following: If your symptoms worsen or you have questions or problems call the [facility] at [phone]  If your symptoms become severe and you cannot manage them at home, please call 911 or go directly to the Emergency Department.  Referral to Covid Therapy coordinator sent    Follow up  Return for Next scheduled follow up.         Subjective:   HPI  Anthony Alvarado is a 59 y.o. male who presents  for a telephone visit with on-going symptoms amidst COVID-19 outbreak. Patient symptoms started on 03/09/19 with cold sx/sore throat and changed sense of smell and taste, slight HA, fatigue, no SOB or CP, no fevers, no chills/sweats, no body aches,  no known contacts with anyone with Covid-19.      ROS  As per HPI.    I have reviewed the problem list, medications, and allergies and have updated/reconciled them if needed.        Objective:     As part of this Telephone Visit, no in-person exam was conducted.       I spent 11 minutes on the phone with the patient on the date of service. I spent an additional 6 minutes on pre- and post-visit activities.     The patient was physically located in [place] or a state in which I am permitted to provide care. The patient and/or parent/guardian understood that s/he may incur co-pays and cost sharing, and agreed to the telemedicine visit. The visit was reasonable and appropriate under the circumstances given the patient's presentation at the time.    The patient and/or parent/guardian has been advised of the potential risks and limitations of this mode of treatment (including, but not limited to, the absence of in-person examination) and has agreed to be treated using telemedicine. The patient's/patient's family's questions regarding telemedicine have been answered.     If the visit was completed in an ambulatory setting, the patient and/or parent/guardian has also been advised to contact their provider???s office for worsening conditions, and seek emergency medical treatment and/or call 911 if the patient deems either necessary.

## 2019-03-17 ENCOUNTER — Encounter: Admit: 2019-03-17 | Discharge: 2019-03-18 | Payer: MEDICARE

## 2019-03-17 DIAGNOSIS — J988 Other specified respiratory disorders: Secondary | ICD-10-CM

## 2019-03-17 DIAGNOSIS — U071 COVID-19: Principal | ICD-10-CM

## 2019-03-17 DIAGNOSIS — Z23 Encounter for immunization: Secondary | ICD-10-CM | POA: Diagnosis not present

## 2019-03-17 NOTE — Unmapped (Signed)
1315 Pt arrived for Bamlanivimab for COVID.     1328 Bamlanivimab 700 mg IV infusing over 1 hr via PIV.    1339 Bamlanivimab paused; patient complained of lower back pain and nausea. 25mg  Benadryl IVP and 20mg  Pepcid IVP given at 1345; at 1354 patient states slight nausea but alleviation of other symptoms. Patient states alleviation of all symptoms at 1402. BP at time of restart 114/72 O2 100%. Patient did state he felt feverish at time of initial reaction; temperature 99.24f temporal. Fever subsided at 1345 after medications given.     1402 Bamlanivimab restarted    1453 Bamlanivimab infusion completed.     1553 1 hr monitoring completed.Flushed with NS. PIV removed. Reviewed AVS with pt.   Pt  was provided with the ???Fact Sheet for Patients, Parents and Caregivers???, has been informed of alternatives to receiving authorized bamlanivimab, and has been Informed that Bamlanivimab is an unapproved drug that is authorized for use under this Emergency Use Authorization  Reverbalized understanding.  Dc'd from Infusion Center.

## 2019-03-17 NOTE — Unmapped (Signed)
Discharge Instructions for Bamlanivimab     After your infusion therapy, you can contact your primary care provider or our Clinical Contact Center [phone] for minor symptoms.     For severe symptoms, please dial 911.     A [facility] nurse will be calling you to check in tomorrow as well.       Those receiving monoclonal or plasma products should wait 90 days until getting immunized.     Thank you for visiting the [facility]. Should you have any questions or concerns about your infusion, please do not hesitate to contact [facility] at [phone] or the nurses' station at [phone].      Fact Sheet for Patients, Parents and Caregivers    Emergency Use Authorization (EUA) of Bamlanivimab for Coronavirus Disease 2019 (COVID-19)    You are being given a medicine called Bamlanivimab for the treatment of coronavirus disease 2019 (COVID19). This Fact Sheet contains information to help you understand the potential risks and potential benefits of taking bamlanivimab, which you may receive.    Receiving Bamlanivimab may benefit certain people with COVID-19.    Read this Fact Sheet for information about Bamlanivimab. Talk to your healthcare provider if you have  questions. It is your choice to receive bamlanivimab or stop it at any time.    What is COVID-19?    COVID-19 is caused by a virus called a coronavirus. People can get COVID-19 through contact with  another person who has the virus.  COVID-19 illnesses have ranged from very mild (including some with no reported symptoms) to severe,  including illness resulting in death. While information so far suggests that most COVID-19 illness is mild,  serious illness can happen and may cause some of your other medical conditions to become worse. People  of all ages with severe, long-lasting (chronic) medical conditions like heart disease, lung disease, and  diabetes, for example, seem to be at higher risk of being hospitalized for COVID-19.    What are the symptoms of COVID-19?    The symptoms of COVID-19 include fever, cough, and shortness of breath, which may appear 2 to 14 days after  exposure. Serious illness including breathing problems can occur and may cause your other medical conditions to  become worse.    What is Bamlanivimab?    Bamlanivimab is an investigational medicine used for the treatment of COVID-19 in non-hospitalized adults and  adolescents 12 years of age and older with mild to moderate symptoms who weigh 88 pounds (40 kg) or more,  and who are at high risk for developing severe COVID-19 symptoms or the need for hospitalization. Bamlanivimab  is investigational because it is still being studied. There is limited information known about the safety or  effectiveness of using bamlanivimab to treat people with COVID-19.    The FDA has authorized the emergency use of bamlanivimab for the treatment of COVID-19 under an Emergency  Use Authorization (EUA). For more information on EUA, see the section ???What is an Emergency Use  Authorization (EUA)???? at the end of this Fact Sheet.    What should I tell my healthcare provider before I receive Bamlanivimab?    Tell your healthcare provider about all of your medical conditions, including if you:  ??? Have any allergies  ??? Are pregnant or plan to become pregnant  ??? Are breastfeeding or plan to breastfeed  ??? Have any serious illnesses  ??? Are taking any medications (prescription, over-the-counter, vitamins, and herbal products)  How will   I receive bamlanivimab?  ??? Bamlanivimab is given to you through a vein (intravenous or IV) for at least 1 hour.  ??? You will receive one dose of bamlanivimab by IV infusion.    What are the important possible side effects of Bamlanivimab?    Possible side effects of bamlanivimab are:    ??? Allergic reactions. Allergic reactions can happen during and after infusion with bamlanivimab. Tell your  healthcare provider right away if you get any of the following signs and symptoms of allergic reactions: fever,  chills, nausea, headache, shortness of breath, low blood pressure, wheezing, swelling of your lips, face, or  throat, rash including hives, itching, muscle aches, and dizziness.    The side effects of getting any medicine by vein may include brief pain, bleeding, bruising of the skin, soreness,  swelling, and possible infection at the infusion site.    These are not all the possible side effects of Bamlanivimab. Not a lot of people have been given bamlanivimab.  Serious and unexpected side effects may happen. Bamlanivimab is still being studied so it is possible that all of  the risks are not known at this time.    It is possible that bamlanivimab could interfere with your body's own ability to fight off a future infection of  SARS-CoV-2. Similarly, Bamlanivimab may reduce your body???s immune response to a vaccine for SARS-CoV-2.  Specific studies have not been conducted to address these possible risks. Talk to your healthcare provider if you  have any questions.    What other treatment choices are there?    Like Bamlanivimab, FDA may allow for the emergency use of other medicines to treat people with COVID-19. Go  to https://www.covid19treatmentguidelines.nih.gov/ for information on the emergency use of other medicines that  are not approved by FDA to treat people with COVID-19. Your healthcare provider may talk with you about clinical  trials you may be eligible for.    It is your choice to be treated or not to be treated with bamlanivimab. Should you decide not to receive  bamlanivimab or stop it at any time, it will not change your standard medical care.    What if I am pregnant or breastfeeding?  There is limited experience treating pregnant women or breastfeeding mothers with bamlanivimab. For a mother  and unborn baby, the benefit of receiving bamlanivimab may be greater than the risk from the treatment. If you  are pregnant or breastfeeding, discuss your options and specific situation with your healthcare provider.    How do I report side effects with bamlanivimab?  Tell your healthcare provider right away if you have any side effect that bothers you or does not go away.  Report side effects to FDA MedWatch at www.fda.gov/medwatch, call 1-800-FDA-1088, or contact [employer] at 1-855-LillyC19 (1-855-545-5921).        CDC Guidelines For When to Be Around Others or Return to Work    When You Can be Around Others  Updated Dec. 1, 2020  Facebook Twitter LinkedIn Syndicate    If you have or think you might have COVID-19, it is important to stay home and away from other people. Staying away from others helps stop the spread of COVID-19. If you have an emergency warning sign (including trouble breathing), get emergency medical care immediately.    I think or know I had COVID-19, and I had symptoms  You can be around others after:    10 days since symptoms first appeared and    24 hours with no fever without the use of fever-reducing medications and  Other symptoms of COVID-19 are improving*  *Loss of taste and smell may persist for weeks or months after recovery and need not delay the end of isolation    Most people do not require testing to decide when they can be around others; however, if your healthcare provider recommends testing, they will let you know when you can resume being around others based on your test results.    Note that these recommendations do not apply to persons with severe COVID-19 or with severely weakened immune systems (immunocompromised). These persons should follow the guidance below for ???I was severely ill with COVID-19 or have a severely weakened immune system (immunocompromised) due to a health condition or medication. When can I be around others????    I tested positive for COVID-19 but had no symptoms    If you continue to have no symptoms, you can be with others after 10 days have passed since you had a positive viral test for COVID-19. Most people do not require testing to decide when they can be around others; however, if your healthcare provider recommends testing, they will let you know when you can resume being around others based on your test results.    If you develop symptoms after testing positive, follow the guidance above for ???I think or know I had COVID-19, and I had symptoms.???    I was severely ill with COVID-19 or have a severely weakened immune system (immunocompromised) due to a health condition or medication. When can I be around others?    People who are severely ill with COVID-19 might need to stay home longer than 10 days and up to 20 days after symptoms first appeared. Persons who are severely immunocompromised may require testing to determine when they can be around others. Talk to your healthcare provider for more information. If testing is available in your community, it may be recommended by your healthcare provider. Your healthcare provider will let you know if you can resume being around other people based on the results of your testing.    Your doctor may work with an infectious disease expert or your local health department to determine whether testing will be necessary before you can be around others.    For Anyone Who Has Been Around a Person with COVID-19    Anyone who has had close contact with someone with COVID-19 should stay home for 14 days after their last exposure to that person.    The best way to protect yourself and others is to stay home for 14 days if you think you???ve been exposed to someone who has COVID-19. Check your local health department???s website for information about options in your area to possibly shorten this quarantine period.  However, anyone who has had close contact with someone with COVID-19 and who meets the following criteria does NOT need to stay home.    Has COVID-19 illness within the previous 3 months and  Has recovered and  Remains without COVID-19 symptoms (for example, cough, shortness of breath)  Confirmed and suspected cases of reinfection of the virus that causes COVID-19  Cases of reinfection of COVID-19 have been reported but are rare. In general, reinfection means a person was infected (got sick) once, recovered, and then later became infected again. Based on what we know from similar viruses, some reinfections are expected.  infected (got sick) once, recovered, and then later became infected again. Based on what we know from similar viruses, some reinfections are expected.

## 2019-03-20 NOTE — Unmapped (Signed)
TRF

## 2019-03-21 ENCOUNTER — Telehealth: Payer: Self-pay | Admitting: *Deleted

## 2019-03-21 NOTE — Telephone Encounter (Signed)
Reports were received from [facility] and Dr. Damita Dunnings asked that I phone the patient to get an update on his Covid symptoms.  Patient says he feels he has a relatively mild case and has received the infusion that was recommended.  He did have a mild reaction to the infusion but with Benadryl, they were able to complete the procedure.  Patient has a virtual appointment scheduled with Dr. Damita Dunnings on Thursday, March 23, 2019.

## 2019-03-22 NOTE — Telephone Encounter (Signed)
Noted. Thanks.

## 2019-03-23 ENCOUNTER — Ambulatory Visit (INDEPENDENT_AMBULATORY_CARE_PROVIDER_SITE_OTHER): Payer: BC Managed Care – PPO | Admitting: Family Medicine

## 2019-03-23 ENCOUNTER — Other Ambulatory Visit: Payer: Self-pay

## 2019-03-23 ENCOUNTER — Encounter: Payer: Self-pay | Admitting: Family Medicine

## 2019-03-23 DIAGNOSIS — U071 COVID-19: Secondary | ICD-10-CM

## 2019-03-23 MED ORDER — TACROLIMUS 1 MG PO CAPS: ORAL_CAPSULE | ORAL | Status: DC

## 2019-03-23 NOTE — Progress Notes (Signed)
Virtual visit completed through WebEx or similar program Patient location: home  Provider location: Financial controller at [facility], office   Pandemic considerations d/w pt.   Limitations and rationale for visit method d/w patient.  Patient agreed to proceed.   CC: f/u.   HPI:  Prev [facility] notes-   [name] is a 59 y.o. male with history of cryptogenic cirrhosis and hepatocellular carcinoma s/o OLT 09/16/2018, aortic valve stenosis, T2DM, and autoimmune cholangitis who is s/p right thoracotomy, decortication, and pleurodesis for recurrent R pleural effusion on 01/13/19. He is doing well at home, with incisional pain, shortness of breath, and energy level improving.  Plan: - Counseled on weaning gabapentin - Follow up in thoracic surgery clinic PRN ============= Discussed the above.  He is slowly decreasing gabapentin.  D/w pt.    In the meantime, no jaundice.  Compliant with baseline meds.  He started feeling like he had a cold on 03/08/2018.  Then had covid positive test.  Then had infusion.  He likely had infusion reaction that improved with benadryl, was able to finish the tx.  He was able to be discharged home.  He has been at home in the meantime.  ST and diarrhea resolved.  No fevers.  No vomiting.  We talked about quarantine guidelines, through 03/25/2019.  We talked about covid vaccine, I'll defer to [facility].  He has some baseline SOB with exertion as expected given relative deconditioning.  Weight is steady per patient report.   He has f/u pending with [facility].    Meds and allergies reviewed.   ROS: Per HPI unless specifically indicated in ROS section   NAD Speech wnl  A/P:  Covid infection, improved, status post infusion..  Still okay for outpatient follow-up.  I would expect him to gradually improve his overall conditioning over the next few days to weeks.  Routine cautions discussed with patient.  He has follow-up with [facility] pending and he will update me as needed.  He has no overt  symptoms of liver failure at this point.

## 2019-03-26 ENCOUNTER — Other Ambulatory Visit: Payer: Self-pay | Admitting: Family Medicine

## 2019-03-26 DIAGNOSIS — U071 COVID-19: Secondary | ICD-10-CM | POA: Insufficient documentation

## 2019-03-26 NOTE — Assessment & Plan Note (Signed)
Covid infection, improved, status post infusion..  Still okay for outpatient follow-up.  I would expect him to gradually improve his overall conditioning over the next few days to weeks.  Routine cautions discussed with patient.  He has follow-up with [facility] pending and he will update me as needed.  He has no overt symptoms of liver failure at this point.

## 2019-03-27 DIAGNOSIS — Z79899 Other long term (current) drug therapy: Secondary | ICD-10-CM | POA: Diagnosis not present

## 2019-03-27 DIAGNOSIS — Z944 Liver transplant status: Secondary | ICD-10-CM | POA: Diagnosis not present

## 2019-03-27 DIAGNOSIS — E612 Magnesium deficiency: Secondary | ICD-10-CM | POA: Diagnosis not present

## 2019-03-28 LAB — CBC W/ DIFFERENTIAL
BANDED NEUTROPHILS ABSOLUTE COUNT: 0 10*3/uL (ref 0.0–0.1)
BASOPHILS ABSOLUTE COUNT: 0.1 10*3/uL (ref 0.0–0.2)
BASOPHILS RELATIVE PERCENT: 1 %
EOSINOPHILS ABSOLUTE COUNT: 0.2 10*3/uL (ref 0.0–0.4)
EOSINOPHILS RELATIVE PERCENT: 2 %
HEMATOCRIT: 42.5 % (ref 37.5–51.0)
HEMOGLOBIN: 14.2 g/dL (ref 13.0–17.7)
IMMATURE GRANULOCYTES: 0 %
LYMPHOCYTES RELATIVE PERCENT: 23 %
MEAN CORPUSCULAR HEMOGLOBIN CONC: 33.4 g/dL (ref 31.5–35.7)
MEAN CORPUSCULAR HEMOGLOBIN: 27 pg (ref 26.6–33.0)
MEAN CORPUSCULAR VOLUME: 81 fL (ref 79–97)
MONOCYTES ABSOLUTE COUNT: 1 10*3/uL — ABNORMAL HIGH (ref 0.1–0.9)
NEUTROPHILS RELATIVE PERCENT: 63 %
PLATELET COUNT: 270 10*3/uL (ref 150–450)
RED BLOOD CELL COUNT: 5.25 x10E6/uL (ref 4.14–5.80)
RED CELL DISTRIBUTION WIDTH: 15.3 % (ref 11.6–15.4)
WHITE BLOOD CELL COUNT: 8.9 10*3/uL (ref 3.4–10.8)

## 2019-03-28 LAB — COMPREHENSIVE METABOLIC PANEL
A/G RATIO: 2.2 (ref 1.2–2.2)
ALBUMIN: 4.8 g/dL (ref 3.8–4.9)
ALKALINE PHOSPHATASE: 124 IU/L — ABNORMAL HIGH (ref 39–117)
ALT (SGPT): 19 IU/L (ref 0–44)
AST (SGOT): 16 IU/L (ref 0–40)
BILIRUBIN TOTAL: 0.3 mg/dL (ref 0.0–1.2)
BLOOD UREA NITROGEN: 23 mg/dL (ref 6–24)
BUN / CREAT RATIO: 23 — ABNORMAL HIGH (ref 9–20)
CALCIUM: 9.5 mg/dL (ref 8.7–10.2)
CHLORIDE: 102 mmol/L (ref 96–106)
CO2: 22 mmol/L (ref 20–29)
CREATININE: 1.02 mg/dL (ref 0.76–1.27)
GLOBULIN, TOTAL: 2.2 g/dL (ref 1.5–4.5)
GLUCOSE: 109 mg/dL — ABNORMAL HIGH (ref 65–99)
POTASSIUM: 4.8 mmol/L (ref 3.5–5.2)
TOTAL PROTEIN: 7 g/dL (ref 6.0–8.5)

## 2019-03-28 LAB — MAGNESIUM: Magnesium:MCnc:Pt:Ser/Plas:Qn:: 1.7

## 2019-03-28 LAB — PHOSPHORUS, SERUM: Phosphate:MCnc:Pt:Ser/Plas:Qn:: 3.4

## 2019-03-28 LAB — GAMMA GLUTAMYL TRANSFERASE: Gamma glutamyl transferase:CCnc:Pt:Ser/Plas:Qn:: 47

## 2019-03-28 LAB — BILIRUBIN DIRECT: Bilirubin.glucuronidated+Bilirubin.albumin bound:MCnc:Pt:Ser/Plas:Qn:: 0.08

## 2019-03-28 LAB — MEAN CORPUSCULAR HEMOGLOBIN CONC: Erythrocyte mean corpuscular hemoglobin concentration:MCnc:Pt:RBC:Qn:Automated count: 33.4

## 2019-03-28 LAB — ALKALINE PHOSPHATASE: Alkaline phosphatase:CCnc:Pt:Ser/Plas:Qn:: 124 — ABNORMAL HIGH

## 2019-03-29 LAB — TACROLIMUS BLOOD: Tacrolimus:MCnc:Pt:Bld:Qn:LC/MS/MS: 7.2

## 2019-03-30 NOTE — Unmapped (Signed)
[facility] Refill Coordination Note    Specialty Medication(s) to be Shipped:   Transplant:  mycophenolic acid 180mg  and tacrolimus 0.5mg     Other medication(s) to be shipped:   Alendronate  Furosemide  Sertraline       Anthony Alvarado, DOB: 1961/02/16  Phone: [phone] (home)       All above HIPAA information was verified with patient.     Was a Nurse, learning disability used for this call? No    Completed refill call assessment today to schedule patient's medication shipment from the [facility] [phone]).       Specialty medication(s) and dose(s) confirmed: Regimen is correct and unchanged.   Changes to medications: Anthony Alvarado reports no changes at this time.  Changes to insurance: No  Questions for the pharmacist: No    Confirmed patient received Welcome Packet with first shipment. The patient will receive a drug information handout for each medication shipped and additional FDA Medication Guides as required.       DISEASE/MEDICATION-SPECIFIC INFORMATION        N/A    SPECIALTY MEDICATION ADHERENCE     Medication Adherence    Patient reported X missed doses in the last month: 0           mycophenolic acid 180mg : 10 days worth of medication on hand.  tacrolimus 0.5mg : 10 days worth of medication on hand.              SHIPPING     Shipping address confirmed in Epic.     Delivery Scheduled: Yes, Expected medication delivery date: 04/05/19.     Medication will be delivered via UPS to the prescription address in Epic WAM.    Anthony Alvarado   [facility] Specialty Technician

## 2019-04-04 DIAGNOSIS — Z944 Liver transplant status: Secondary | ICD-10-CM | POA: Diagnosis not present

## 2019-04-04 DIAGNOSIS — E612 Magnesium deficiency: Secondary | ICD-10-CM | POA: Diagnosis not present

## 2019-04-04 DIAGNOSIS — Z79899 Other long term (current) drug therapy: Secondary | ICD-10-CM | POA: Diagnosis not present

## 2019-04-04 NOTE — Unmapped (Signed)
Anthony Alvarado 's ENTIRE shipment will be delayed as a result of the medication is too soon to refill until 04/07/2019.     I have reached out to the patient and communicated the delay. We will reschedule the medication for the delivery date that the patient agreed upon.  We have confirmed the delivery date as 04/10/2019, via ups. Only mycophenolate was too soon per the insurance but patient requested that we send everything together on 04/07/19 instead.

## 2019-04-05 LAB — CBC W/ DIFFERENTIAL
BANDED NEUTROPHILS ABSOLUTE COUNT: 0 10*3/uL (ref 0.0–0.1)
BASOPHILS ABSOLUTE COUNT: 0.1 10*3/uL (ref 0.0–0.2)
BASOPHILS RELATIVE PERCENT: 1 %
EOSINOPHILS RELATIVE PERCENT: 4 %
HEMATOCRIT: 41.9 % (ref 37.5–51.0)
HEMOGLOBIN: 14.2 g/dL (ref 13.0–17.7)
IMMATURE GRANULOCYTES: 1 %
LYMPHOCYTES ABSOLUTE COUNT: 1.7 10*3/uL (ref 0.7–3.1)
LYMPHOCYTES RELATIVE PERCENT: 22 %
MEAN CORPUSCULAR HEMOGLOBIN CONC: 33.9 g/dL (ref 31.5–35.7)
MEAN CORPUSCULAR HEMOGLOBIN: 27.3 pg (ref 26.6–33.0)
MEAN CORPUSCULAR VOLUME: 80 fL (ref 79–97)
MONOCYTES ABSOLUTE COUNT: 0.9 10*3/uL (ref 0.1–0.9)
MONOCYTES RELATIVE PERCENT: 12 %
NEUTROPHILS ABSOLUTE COUNT: 4.8 10*3/uL (ref 1.4–7.0)
NEUTROPHILS RELATIVE PERCENT: 60 %
PLATELET COUNT: 228 10*3/uL (ref 150–450)
RED BLOOD CELL COUNT: 5.21 x10E6/uL (ref 4.14–5.80)
RED CELL DISTRIBUTION WIDTH: 15.5 % — ABNORMAL HIGH (ref 11.6–15.4)

## 2019-04-05 LAB — GAMMA GLUTAMYL TRANSFERASE: Gamma glutamyl transferase:CCnc:Pt:Ser/Plas:Qn:: 43

## 2019-04-05 LAB — COMPREHENSIVE METABOLIC PANEL
A/G RATIO: 2 (ref 1.2–2.2)
ALBUMIN: 4.7 g/dL (ref 3.8–4.9)
ALKALINE PHOSPHATASE: 123 IU/L — ABNORMAL HIGH (ref 39–117)
AST (SGOT): 21 IU/L (ref 0–40)
BILIRUBIN TOTAL: 0.3 mg/dL (ref 0.0–1.2)
BLOOD UREA NITROGEN: 25 mg/dL — ABNORMAL HIGH (ref 6–24)
BUN / CREAT RATIO: 21 — ABNORMAL HIGH (ref 9–20)
CHLORIDE: 103 mmol/L (ref 96–106)
CO2: 18 mmol/L — ABNORMAL LOW (ref 20–29)
CREATININE: 1.17 mg/dL (ref 0.76–1.27)
GLOBULIN, TOTAL: 2.3 g/dL (ref 1.5–4.5)
GLUCOSE: 128 mg/dL — ABNORMAL HIGH (ref 65–99)
POTASSIUM: 4.9 mmol/L (ref 3.5–5.2)
SODIUM: 140 mmol/L (ref 134–144)
TOTAL PROTEIN: 7 g/dL (ref 6.0–8.5)

## 2019-04-05 LAB — BILIRUBIN DIRECT: Bilirubin.glucuronidated+Bilirubin.albumin bound:MCnc:Pt:Ser/Plas:Qn:: 0.09

## 2019-04-05 LAB — CALCIUM: Calcium:MCnc:Pt:Ser/Plas:Qn:: 9.8

## 2019-04-05 LAB — MAGNESIUM: Magnesium:MCnc:Pt:Ser/Plas:Qn:: 1.7

## 2019-04-05 LAB — MEAN CORPUSCULAR VOLUME: Erythrocyte mean corpuscular volume:EntVol:Pt:RBC:Qn:Automated count: 80

## 2019-04-05 LAB — PHOSPHORUS, SERUM: Phosphate:MCnc:Pt:Ser/Plas:Qn:: 3.6

## 2019-04-06 LAB — TACROLIMUS BLOOD: Tacrolimus:MCnc:Pt:Bld:Qn:LC/MS/MS: 5.6

## 2019-04-07 MED FILL — MYCOPHENOLATE SODIUM 180 MG TABLET,DELAYED RELEASE: 30 days supply | Qty: 120 | Fill #7 | Status: AC

## 2019-04-07 MED FILL — TACROLIMUS 0.5 MG CAPSULE: 30 days supply | Qty: 270 | Fill #1 | Status: AC

## 2019-04-07 MED FILL — FUROSEMIDE 20 MG TABLET: ORAL | 30 days supply | Qty: 60 | Fill #1

## 2019-04-07 MED FILL — FUROSEMIDE 20 MG TABLET: 30 days supply | Qty: 60 | Fill #1 | Status: AC

## 2019-04-07 MED FILL — MYCOPHENOLATE SODIUM 180 MG TABLET,DELAYED RELEASE: ORAL | 30 days supply | Qty: 120 | Fill #7

## 2019-04-07 MED FILL — ALENDRONATE 70 MG TABLET: ORAL | 28 days supply | Qty: 4 | Fill #4

## 2019-04-07 MED FILL — SERTRALINE 25 MG TABLET: 30 days supply | Qty: 30 | Fill #1 | Status: AC

## 2019-04-07 MED FILL — ALENDRONATE 70 MG TABLET: 28 days supply | Qty: 4 | Fill #4 | Status: AC

## 2019-04-07 MED FILL — SERTRALINE 25 MG TABLET: ORAL | 30 days supply | Qty: 30 | Fill #1

## 2019-04-07 MED FILL — TACROLIMUS 0.5 MG CAPSULE, IMMEDIATE-RELEASE: ORAL | 30 days supply | Qty: 270 | Fill #1

## 2019-04-11 DIAGNOSIS — E612 Magnesium deficiency: Secondary | ICD-10-CM | POA: Diagnosis not present

## 2019-04-11 DIAGNOSIS — Z944 Liver transplant status: Secondary | ICD-10-CM | POA: Diagnosis not present

## 2019-04-11 DIAGNOSIS — Z79899 Other long term (current) drug therapy: Secondary | ICD-10-CM | POA: Diagnosis not present

## 2019-04-12 DIAGNOSIS — M792 Neuralgia and neuritis, unspecified: Secondary | ICD-10-CM | POA: Diagnosis not present

## 2019-04-12 DIAGNOSIS — G8912 Acute post-thoracotomy pain: Secondary | ICD-10-CM | POA: Diagnosis not present

## 2019-04-12 DIAGNOSIS — G4733 Obstructive sleep apnea (adult) (pediatric): Secondary | ICD-10-CM | POA: Diagnosis not present

## 2019-04-12 LAB — CBC W/ DIFFERENTIAL
BASOPHILS ABSOLUTE COUNT: 0.1 10*3/uL (ref 0.0–0.2)
BASOPHILS RELATIVE PERCENT: 1 %
EOSINOPHILS ABSOLUTE COUNT: 0.3 10*3/uL (ref 0.0–0.4)
HEMATOCRIT: 41 % (ref 37.5–51.0)
HEMOGLOBIN: 13.6 g/dL (ref 13.0–17.7)
IMMATURE GRANULOCYTES: 1 %
LYMPHOCYTES ABSOLUTE COUNT: 1.8 10*3/uL (ref 0.7–3.1)
LYMPHOCYTES RELATIVE PERCENT: 18 %
MEAN CORPUSCULAR HEMOGLOBIN CONC: 33.2 g/dL (ref 31.5–35.7)
MEAN CORPUSCULAR VOLUME: 82 fL (ref 79–97)
MONOCYTES ABSOLUTE COUNT: 1.2 10*3/uL — ABNORMAL HIGH (ref 0.1–0.9)
MONOCYTES RELATIVE PERCENT: 11 %
NEUTROPHILS ABSOLUTE COUNT: 7 10*3/uL (ref 1.4–7.0)
NEUTROPHILS RELATIVE PERCENT: 66 %
PLATELET COUNT: 199 10*3/uL (ref 150–450)
RED BLOOD CELL COUNT: 5 x10E6/uL (ref 4.14–5.80)
RED CELL DISTRIBUTION WIDTH: 14.8 % (ref 11.6–15.4)
WHITE BLOOD CELL COUNT: 10.4 10*3/uL (ref 3.4–10.8)

## 2019-04-12 LAB — COMPREHENSIVE METABOLIC PANEL
A/G RATIO: 2.2 (ref 1.2–2.2)
ALBUMIN: 4.6 g/dL (ref 3.8–4.9)
ALKALINE PHOSPHATASE: 128 IU/L — ABNORMAL HIGH (ref 39–117)
ALT (SGPT): 22 IU/L (ref 0–44)
AST (SGOT): 19 IU/L (ref 0–40)
BLOOD UREA NITROGEN: 27 mg/dL — ABNORMAL HIGH (ref 6–24)
BUN / CREAT RATIO: 25 — ABNORMAL HIGH (ref 9–20)
CHLORIDE: 104 mmol/L (ref 96–106)
CO2: 20 mmol/L (ref 20–29)
CREATININE: 1.07 mg/dL (ref 0.76–1.27)
GLOBULIN, TOTAL: 2.1 g/dL (ref 1.5–4.5)
GLUCOSE: 115 mg/dL — ABNORMAL HIGH (ref 65–99)
POTASSIUM: 4.6 mmol/L (ref 3.5–5.2)
TOTAL PROTEIN: 6.7 g/dL (ref 6.0–8.5)

## 2019-04-12 LAB — TACROLIMUS BLOOD: Tacrolimus:MCnc:Pt:Bld:Qn:LC/MS/MS: 4.9

## 2019-04-12 LAB — PHOSPHORUS, SERUM: Phosphate:MCnc:Pt:Ser/Plas:Qn:: 3.3

## 2019-04-12 LAB — CHLORIDE: Chloride:SCnc:Pt:Ser/Plas:Qn:: 104

## 2019-04-12 LAB — EOSINOPHILS ABSOLUTE COUNT: Eosinophils:NCnc:Pt:Bld:Qn:Automated count: 0.3

## 2019-04-12 LAB — GAMMA GLUTAMYL TRANSFERASE: Gamma glutamyl transferase:CCnc:Pt:Ser/Plas:Qn:: 39

## 2019-04-12 LAB — MAGNESIUM: Magnesium:MCnc:Pt:Ser/Plas:Qn:: 1.6

## 2019-04-12 LAB — BILIRUBIN DIRECT: Bilirubin.glucuronidated+Bilirubin.albumin bound:MCnc:Pt:Ser/Plas:Qn:: 0.1

## 2019-04-12 NOTE — Unmapped (Signed)
Chronic Pain Follow Up Note    I participated in a real-time audio and video with the patient. The patient consented to this consult.    The patient was physically located in [place] or a state in which I am permitted to provide care. The patient understood that s/he may incur co-pays and cost sharing, and agreed to the telemedicine visit. The visit was completed via phone and/or video, which was appropriate and reasonable under the circumstances given the patient's presentation at the time.    The patient has been advised of the potential risks and limitations of this mode of treatment (including, but not limited to, the absence of in-person examination) and has agreed to be treated using telemedicine. The patient's/patient's family's questions regarding telemedicine have been answered. No vitals or physical exam was performed but the previous exam was copied forward in this note for continuity.     If the phone/video visit was completed in an ambulatory setting, the patient has also been advised to contact their provider???s office for worsening conditions, and seek emergency medical treatment and/or call 911 if the patient deems either necessary.    Visit modifiers:   POS 02 and 95 (virtual visit with video)    -Location of patient during visit: [place]  -Provider location: [place]  -Names of all people present during visit: patient, Dr. Eustaquio Maize    Assessment and Plan    Anthony Alvarado is a 59 y.o. male with past medical history who is being followed at [facility] clinic for right sided pain localized near his prior surgical incision s/p right thoracotomy (01/13/19) for decortication, and pleurodesis for a recurrent pleural effusion. Patient has a PMH significant for cryptogenic cirrhosis and hepatocellular carcinoma??s/p??OLT 09/16/2018.   ??  Today, patient reports excellent analgesia and is in the process of weaning off of the gabapentin.  He takes APAP on occasion and hasn't needed OTC lidocaine.  He feels he is doing much better and we will plan to follow up as needed.       PLAN:  - Continue weaning off of gabapentin   - Continue Lidocaine patch if needed  - APAP prn    - Return if symptoms worsen or fail to improve.    Today I have prescribed:  Requested Prescriptions      No prescriptions requested or ordered in this encounter       No orders of the defined types were placed in this encounter.      Future Considerations:   Cymbalta  Intercostal nerve block       HPI  Anthony Alvarado is a 59 y.o. being followed at [facility] clinic for complaint of chronic pain localized to right sided pain localized near his prior surgical incision s/p right thoracotomy (01/13/19) for decortication, and pleurodesis for a recurrent pleural effusion.     Patient was last seen in January 2021 with Dr. Doylene Canard at which point patient reported improved pain overall and his medications were continued without changes. Since last visit, patient tested positive for COVID-19 on 03/15/19 but has since recovered.    Today, he reports that his pain is much improved.  It continues to be an aching, dull pain in the anterior portion of his lower ribs.  It will occasionally shoot.  He has been weaning off of his gabapentin and is down to 600mg  every other evening.  His pain ranges from 1-4/10.  He hasn't needed the lidocaine patches and occasionally is taking  APAP.    Current analgesic regimen:  Gabapentin currently weaning off  Lidocaine patch OTC  APAP        No questionnaires available.                       Previous Medication Trials:  NSAIDS- none tried  Antidepressants- none tried  Neuroleptics- gabapentin  Muscle relaxants- cyclobenzaprine (Flexeril)  Topicals- None  Short-acting opiates- oxycodone  Long-acting opiates- None  Anxiolytics- none    Previous Interventions  None     Allergies  Allergies   Allergen Reactions   ??? Watermelon Flavor      Mouth itching       Home Medications    Current Outpatient Medications   Medication Sig Dispense Refill   ??? acetaminophen (TYLENOL) 325 MG tablet Take 2 tablets (650 mg total) by mouth every six (6) hours as needed for pain or fever (> 38C or 100.55F). 100 tablet 0   ??? alendronate (FOSAMAX) 70 MG tablet Take 1 tablet (70 mg total) by mouth every seven (7) days. (Patient not taking: Reported on 03/07/2019) 4 tablet 11   ??? amLODIPine (NORVASC) 5 MG tablet Take 2 tablets (10 mg total) by mouth daily. 30 tablet 11   ??? aspirin (ECOTRIN) 81 MG tablet Take 1 tablet (81 mg total) by mouth daily. 30 tablet 11   ??? azelastine (ASTELIN) 137 mcg (0.1 %) nasal spray 1 spray by Each Nare route daily as needed.      ??? blood sugar diagnostic Strp Use as directed Three (3) times a day before meals. 90 each 11   ??? calcium carbonate (TUMS) 200 mg calcium (500 mg) chewable tablet Chew 1 tablet Three (3) times a day as needed for heartburn.      ??? cholecalciferol, vitamin D3, 1,000 unit (25 mcg) tablet Take 2 tablets (2,000 Units total) by mouth daily. 60 tablet 11   ??? docusate sodium (COLACE) 100 MG capsule Take 1 capsule (100 mg total) by mouth two (2) times a day as needed for constipation. 60 capsule 0   ??? fluticasone propionate (FLONASE) 50 mcg/actuation nasal spray PLACE ONE OR TWO SPRAYS INTO BOTH NOSTRILS DAILY AS NEEDED.     ??? furosemide (LASIX) 20 MG tablet Take 2 tablets (40 mg total) by mouth daily. 60 tablet 11   ??? gabapentin (NEURONTIN) 300 MG capsule Take 2 capsules (600 mg total) by mouth Three (3) times a day. 540 capsule 3   ??? glipiZIDE (GLUCOTROL XL) 5 MG 24 hr tablet      ??? lancets 33 gauge Misc 1 each by Miscellaneous route Three (3) times a day before meals. 100 each 11   ??? levothyroxine (SYNTHROID) 150 MCG tablet Take 1 tablet (150 mcg total) by mouth daily. 30 tablet 11   ??? lidocaine (LIDODERM) 5 % patch Place 1 patch on the skin daily. Apply to affected area for 12 hours only each day (then remove patch) 30 patch 2   ??? magnesium oxide-Mg AA chelate (MAGNESIUM, AMINO ACID CHELATE,) 133 mg Tab Take 1 tablet by mouth Two (2) times a day. HOLD until directed to start by your coordinator. 60 tablet 11   ??? mycophenolate (MYFORTIC) 180 MG EC tablet Take 2 tablets (360 mg total) by mouth Two (2) times a day. 120 tablet 11   ??? sertraline (ZOLOFT) 25 MG tablet Take 1 tablet (25 mg total) by mouth daily. 30 tablet 11   ??? tacrolimus (PROGRAF) 0.5 MG capsule  Take 5 capsules (2.5 mg total) by mouth two (2) times a day. 300 capsule 11     No current facility-administered medications for this visit.      Facility-Administered Medications Ordered in Other Visits   Medication Dose Route Frequency Provider Last Rate Last Admin   ??? diazePAM (VALIUM) 5 mg/mL injection                ROS  8 point systems reviewed and negative except as mentioned in HPI      Physical Exam    VITALS: There were no vitals filed for this visit.  Wt Readings from Last 6 Encounters:   03/07/19 85.5 kg (188 lb 6.4 oz)   02/28/19 84.5 kg (186 lb 3.2 oz)   01/31/19 80.4 kg (177 lb 4.8 oz)   01/23/19 78.8 kg (173 lb 11.2 oz)   01/11/19 81.4 kg (179 lb 7.3 oz)   01/05/19 81.4 kg (179 lb 6.4 oz)     PHYSICAL EXAM:     GENERAL:  The patient appears to be in no distress. The patient is pleasant and interactive. Patient is a good historian.  No evidence of sedation.  HEENT:    Atraumatic.    RESPIRATORY:   Normal work of breathing.  No supplemental O2.  GASTROINTESTINAL:   Deferred.  NEUROLOGIC:    The patient was alert and oriented, speech fluent, normal language.   MUSCULOSKELETAL:    Deferred.  SKIN:   Deferred.  PSYCHIATRIC:  Appropriate mood and affect.  No pressured speech.      Documentation assistance was provided by Daleen Bo, Scribe, on April 12, 2019 at 9:34 AM for Dr. Buena Irish, MD.    April 13, 2019 7:06 PM. Documentation assistance provided by the Scribe. I was present during the time the encounter was recorded. The information recorded by the Scribe was done at my direction and has been reviewed and validated by me. -Manases Etchison A. Eustaquio Maize, MD

## 2019-04-13 DIAGNOSIS — Z79899 Other long term (current) drug therapy: Principal | ICD-10-CM

## 2019-04-13 DIAGNOSIS — Z944 Liver transplant status: Principal | ICD-10-CM

## 2019-04-13 MED ORDER — TACROLIMUS 0.5 MG CAPSULE
ORAL_CAPSULE | Freq: Two times a day (BID) | ORAL | 11 refills | 30 days | Status: CP
Start: 2019-04-13 — End: ?

## 2019-04-13 NOTE — Unmapped (Signed)
Patient's 2/16 tac level again resulted subpar. Discussed with txp PharmD Mincemoyer, who recommended increasing his dose to 2.5mg  bid. Spoke to patient and relayed results and dose change. He verbalized understanding.

## 2019-04-14 NOTE — Unmapped (Signed)
Change in Tacrolimus dosage increase. Refill too soon until 04/26/2019. Will attempt test claim on 04/26/2019 date.

## 2019-04-14 NOTE — Unmapped (Signed)
I am glad you are doing so well!  Let us know if you need anything in the future.

## 2019-04-14 NOTE — Unmapped (Signed)
TRANSPLANT SURGERY FOLLOW UP CLINIC NOTE     Assessment and Plan:   Anthony Alvarado is a 59 y.o. male who underwent OLT on 09/16/2018 for cryptogenic cirrhosis with HCC (subsequently found to have A1AT on biopsy)  who presents for 6 month posttransplant follow up    Immunosuppression:   - has been taking 2.5mg  IBID of tacrolimus but beginning to be unaffortable (goal 6-8)  - will transition to envarsus vs. mTOR based on discussion with Transplant hepatologist Dr. Sherryll Burger  -MMF consider decreasing given HCC    History of HCC  - MRI and AFP stable, no concern for disease recurrence    Night Sweats  - investigate testosterone levels as a cause for night sweats/hot flashes. Low end of normal so will repeat with free testosterone and total. May benefit from supplemental trial  - check EBV albeit low risk    Encouraged him to increase physical activity and reiterated transplant dieticians advice with regard to diet modification to protect his kidneys and reduce risk of fatty liver diease.    Immunization History   Administered Date(s) Administered   ??? INFLUENZA TIV (TRI) 86MO+ W/ PRESERV (IM) 11/18/2011   ??? Influenza Vaccine Quad (IIV4 PF) 72mo+ injectable 01/25/2014, 02/11/2015, 01/19/2017, 01/11/2018, 10/28/2018   ??? Influenza Virus Vaccine, unspecified formulation 01/11/2018, 11/23/2018   ??? Influenza Whole 12/27/2007, 10/29/2009   ??? PNEUMOCOCCAL POLYSACCHARIDE 23 01/11/2018   ??? Pneumococcal Conjugate 13-Valent 04/17/2019   ??? Tetanus and diptheria,(adult), adsorbed, 2Lf tetanus toxoid, PF 03/05/1997, 10/09/2008   administered Prevnar today given prior VATS and COVID.    Follow up: 3 months for 9 month post transplant   Labs: weekly    I personally spent 30 minutes face-to-face and non-face-to-face in the care of this patient, which includes all pre, intra, and post visit time on the date of service. Greater than 50% of the time was spent on counseling and the substance of the discussion.    History of Present Illness:   Anthony Alvarado is a 59 y.o. male with cryptogenic cirrhosis and HCC s/p OLT 09/16/2018.  PMH additionally notable for severe aortic valve stenosis, HTN, DMII, OSA and Autoimmune cholangitis, carrier of hemochromatosis HFE gene mutation.      Of note explant remonstrated viable HCC within segment 6 but without vascular, lymph, or bile duct invasion. There was also a high grade dysplastic nodule with cirrhotic changes in segment 5. Explant also potentially consistent with underlying A1AT.     Initial post-operative course complicated by early pneumonia mid august 2020.  He was again hospitalized from 10/6-10/8 for chest pain with negative ECG and CTA. Tpn was 0.039 and downtrended. MRI obtained for [facility] surveillance which showed stable posterior hepatic fluid collection (post-op hematoma unchanged in size) and loculated ascites along the falciform ligament, no enhancement to suggest abscess. VIR performed percutaneous aspiration of perihepatic fluid collection on 10/8. Patient also had new onset back pain with negative plain films - he was seen by ortho who recommended PT as an outpatient.    Admitted again for recurrence of r sided pleural effusion and associated SOB 11/2-11/4. underwent thoracentesis with Interventional Pulmonology on 11/3 with 1L serosanguinous fluid evacuated. Pleural fluid analysis showed eosiniphillic predominance. He redeveloped a pleural effusion 11/18, with loculations, underwent pigtail catheter placement by interventional pulmonology with 2L of output. Subsequent Thoracic Surgery consult in the setting of multiple pleural effusions now with loculations and a residual basilar pneumothorax in the right lung who recommended and completed Right pulmonary  decortication and pleurodesis on 01/13/2019.    From a post-op standpoint he has recovered well from this procedure and has weaned off all pain medications. Unfortunately, in the interim, he contracted COVID treated with BAM in the outpatient setting without significant pulmonary compromise.    He denies acute complaint today. No N/V/D/C. He has continued, since pre transplant to experience night sweats and low stamina. He has occasional headaches from his tacrolimus.    Current Outpatient Medications   Medication Sig Dispense Refill   ??? acetaminophen (TYLENOL) 325 MG tablet Take 2 tablets (650 mg total) by mouth every six (6) hours as needed for pain or fever (> 38C or 100.21F). (Patient not taking: Reported on 04/17/2019) 100 tablet 0   ??? alendronate (FOSAMAX) 70 MG tablet Take 1 tablet (70 mg total) by mouth every seven (7) days. 4 tablet 11   ??? amLODIPine (NORVASC) 5 MG tablet Take 2 tablets (10 mg total) by mouth daily. 30 tablet 11   ??? aspirin (ECOTRIN) 81 MG tablet Take 1 tablet (81 mg total) by mouth daily. 30 tablet 11   ??? azelastine (ASTELIN) 137 mcg (0.1 %) nasal spray 1 spray by Each Nare route daily as needed.      ??? blood sugar diagnostic Strp Use as directed Three (3) times a day before meals. 90 each 11   ??? calcium carbonate (TUMS) 200 mg calcium (500 mg) chewable tablet Chew 1 tablet Three (3) times a day as needed for heartburn.      ??? cholecalciferol, vitamin D3, 1,000 unit (25 mcg) tablet Take 2 tablets (2,000 Units total) by mouth daily. 60 tablet 11   ??? docusate sodium (COLACE) 100 MG capsule Take 1 capsule (100 mg total) by mouth two (2) times a day as needed for constipation. 60 capsule 0   ??? fluticasone propionate (FLONASE) 50 mcg/actuation nasal spray PLACE ONE OR TWO SPRAYS INTO BOTH NOSTRILS DAILY AS NEEDED.     ??? furosemide (LASIX) 20 MG tablet Take 2 tablets (40 mg total) by mouth daily as needed for swelling. 60 tablet 11   ??? lancets 33 gauge Misc 1 each by Miscellaneous route Three (3) times a day before meals. 100 each 11   ??? levothyroxine (SYNTHROID) 150 MCG tablet Take 1 tablet (150 mcg total) by mouth daily. 30 tablet 11   ??? magnesium oxide-Mg AA chelate (MAGNESIUM, AMINO ACID CHELATE,) 133 mg Tab Take 1 tablet by mouth Two (2) times a day. HOLD until directed to start by your coordinator. (Patient not taking: Reported on 04/17/2019) 60 tablet 11   ??? mycophenolate (MYFORTIC) 180 MG EC tablet Take 2 tablets (360 mg total) by mouth Two (2) times a day. 120 tablet 11   ??? pravastatin (PRAVACHOL) 40 MG tablet Take 1 tablet (40 mg total) by mouth every evening. 90 tablet 3   ??? sertraline (ZOLOFT) 25 MG tablet Take 1 tablet (25 mg total) by mouth daily. 30 tablet 11   ??? tacrolimus (ENVARSUS XR) 0.75 mg Tb24 extended release tablet Take 2 tablets (1.5 mg total) by mouth daily with 2 (1 mg) tablets for a total dose of 3.5 mg daily 60 tablet 3   ??? tacrolimus (ENVARSUS XR) 1 mg Tb24 extended release tablet Take 2 tablets (2 mg) by mouth daily with 2 (0.75 mg) tablets for a total dose of 3.5 mg daily 60 tablet 3   ??? tacrolimus (PROGRAF) 0.5 MG capsule Take 5 capsules (2.5 mg total) by mouth two (2) times a  day. 300 capsule 11     Current Facility-Administered Medications   Medication Dose Route Frequency Provider Last Rate Last Admin   ??? diazePAM (VALIUM) injection 5 mg  5 mg Intravenous Q10 Min PRN Pierce Crane, MD         Facility-Administered Medications Ordered in Other Visits   Medication Dose Route Frequency Provider Last Rate Last Admin   ??? diazePAM (VALIUM) 5 mg/mL injection                Physical Exam:  BP 125/84  - Pulse 69  - Temp 36.6 ??C (97.9 ??F) (Tympanic)  - Ht 172.7 cm (5' 8)  - Wt 84.6 kg (186 lb 8 oz)  - BMI 28.36 kg/m??   General Appearance:  No acute distress, well appearing and well nourished.   Head:  Normocephalic, atraumatic.   Eyes:  No scleral icterus.   Pulmonary:    Normal respiratory effort. Decreased breath sounds at right base.   Cardiovascular:  Regular rate and rhythm.   Abdomen:   Scar well healed   Neurologic: Non-focal exam.         Lab Results   Component Value Date    WBC 9.5 04/17/2019    HGB 15.1 04/17/2019    HCT 44.9 04/17/2019    PLT 250 04/17/2019     Lab Results   Component Value Date    NA 140 04/17/2019    K 4.8 04/17/2019    CL 105 04/17/2019    CO2 27.0 04/17/2019    BUN 27 (H) 04/17/2019    CREATININE 0.95 04/17/2019    CALCIUM 9.4 04/17/2019    MG 1.8 04/17/2019    PHOS 3.3 04/17/2019      Lab Results   Component Value Date    ALKPHOS 113 04/17/2019    BILITOT 0.3 04/17/2019    BILIDIR <0.10 04/17/2019    PROT 7.5 04/17/2019    ALBUMIN 4.7 04/17/2019    ALT 22 04/17/2019    AST 24 04/17/2019    GGT 46 04/17/2019      Lab Results   Component Value Date    APTT 29.6 10/12/2018        IMAGING:  Mri Abdomen W Wo Contrast  Result Date: 04/17/2019  Sequelae of liver transplantation. No worrisome hepatic lesions or evidence of metastatic disease to the abdomen.          Gertie Fey, DNP, APRN, FNP-C  [facility]  [address]  [place], [place]  [address]

## 2019-04-15 DIAGNOSIS — C220 Liver cell carcinoma: Principal | ICD-10-CM

## 2019-04-15 DIAGNOSIS — R739 Hyperglycemia, unspecified: Principal | ICD-10-CM

## 2019-04-15 DIAGNOSIS — Z944 Liver transplant status: Principal | ICD-10-CM

## 2019-04-15 DIAGNOSIS — E559 Vitamin D deficiency, unspecified: Principal | ICD-10-CM

## 2019-04-15 DIAGNOSIS — B259 Cytomegaloviral disease, unspecified: Principal | ICD-10-CM

## 2019-04-15 DIAGNOSIS — E612 Magnesium deficiency: Principal | ICD-10-CM

## 2019-04-15 DIAGNOSIS — Z5181 Encounter for therapeutic drug level monitoring: Principal | ICD-10-CM

## 2019-04-17 ENCOUNTER — Encounter: Admit: 2019-04-17 | Discharge: 2019-04-17 | Payer: MEDICARE

## 2019-04-17 ENCOUNTER — Encounter: Admit: 2019-04-17 | Discharge: 2019-04-17 | Payer: MEDICARE | Attending: Nutritionist | Primary: Nutritionist

## 2019-04-17 ENCOUNTER — Encounter: Admit: 2019-04-17 | Discharge: 2019-04-17 | Payer: MEDICARE | Attending: Health Service | Primary: Health Service

## 2019-04-17 DIAGNOSIS — R5383 Other fatigue: Principal | ICD-10-CM

## 2019-04-17 DIAGNOSIS — R61 Generalized hyperhidrosis: Principal | ICD-10-CM

## 2019-04-17 DIAGNOSIS — Z23 Encounter for immunization: Principal | ICD-10-CM

## 2019-04-17 DIAGNOSIS — Z944 Liver transplant status: Principal | ICD-10-CM

## 2019-04-17 DIAGNOSIS — N200 Calculus of kidney: Principal | ICD-10-CM

## 2019-04-17 DIAGNOSIS — E789 Disorder of lipoprotein metabolism, unspecified: Principal | ICD-10-CM

## 2019-04-17 DIAGNOSIS — Z5181 Encounter for therapeutic drug level monitoring: Principal | ICD-10-CM

## 2019-04-17 DIAGNOSIS — E612 Magnesium deficiency: Principal | ICD-10-CM

## 2019-04-17 DIAGNOSIS — Z148 Genetic carrier of other disease: Secondary | ICD-10-CM | POA: Diagnosis not present

## 2019-04-17 DIAGNOSIS — F40240 Claustrophobia: Secondary | ICD-10-CM | POA: Diagnosis not present

## 2019-04-17 DIAGNOSIS — F4323 Adjustment disorder with mixed anxiety and depressed mood: Secondary | ICD-10-CM | POA: Diagnosis not present

## 2019-04-17 DIAGNOSIS — Z7982 Long term (current) use of aspirin: Secondary | ICD-10-CM | POA: Diagnosis not present

## 2019-04-17 DIAGNOSIS — G4733 Obstructive sleep apnea (adult) (pediatric): Secondary | ICD-10-CM | POA: Diagnosis not present

## 2019-04-17 DIAGNOSIS — F40248 Other situational type phobia: Secondary | ICD-10-CM | POA: Diagnosis not present

## 2019-04-17 DIAGNOSIS — Z8505 Personal history of malignant neoplasm of liver: Secondary | ICD-10-CM | POA: Diagnosis not present

## 2019-04-17 DIAGNOSIS — I10 Essential (primary) hypertension: Secondary | ICD-10-CM | POA: Diagnosis not present

## 2019-04-17 DIAGNOSIS — Z8616 Personal history of COVID-19: Secondary | ICD-10-CM | POA: Diagnosis not present

## 2019-04-17 DIAGNOSIS — Z4823 Encounter for aftercare following liver transplant: Secondary | ICD-10-CM | POA: Diagnosis not present

## 2019-04-17 DIAGNOSIS — I350 Nonrheumatic aortic (valve) stenosis: Secondary | ICD-10-CM | POA: Diagnosis not present

## 2019-04-17 DIAGNOSIS — E119 Type 2 diabetes mellitus without complications: Secondary | ICD-10-CM | POA: Diagnosis not present

## 2019-04-17 DIAGNOSIS — Z79899 Other long term (current) drug therapy: Secondary | ICD-10-CM | POA: Diagnosis not present

## 2019-04-17 DIAGNOSIS — Z8719 Personal history of other diseases of the digestive system: Secondary | ICD-10-CM | POA: Diagnosis not present

## 2019-04-17 DIAGNOSIS — Z8701 Personal history of pneumonia (recurrent): Secondary | ICD-10-CM | POA: Diagnosis not present

## 2019-04-17 LAB — LIPID PANEL
CHOLESTEROL: 122 mg/dL (ref 100–199)
HDL CHOLESTEROL: 33 mg/dL — ABNORMAL LOW (ref 40–59)
LDL CHOLESTEROL CALCULATED: 52 mg/dL — ABNORMAL LOW (ref 60–99)
NON-HDL CHOLESTEROL: 89 mg/dL

## 2019-04-17 LAB — CBC W/ AUTO DIFF
BASOPHILS ABSOLUTE COUNT: 0.1 10*9/L (ref 0.0–0.1)
EOSINOPHILS ABSOLUTE COUNT: 0.3 10*9/L (ref 0.0–0.4)
EOSINOPHILS RELATIVE PERCENT: 2.8 %
HEMATOCRIT: 44.9 % (ref 41.0–53.0)
HEMOGLOBIN: 15.1 g/dL (ref 13.5–17.5)
LARGE UNSTAINED CELLS: 2 % (ref 0–4)
LYMPHOCYTES ABSOLUTE COUNT: 1.9 10*9/L (ref 1.5–5.0)
LYMPHOCYTES RELATIVE PERCENT: 20 %
MEAN CORPUSCULAR HEMOGLOBIN CONC: 33.6 g/dL (ref 31.0–37.0)
MEAN CORPUSCULAR HEMOGLOBIN: 27.9 pg (ref 26.0–34.0)
MEAN CORPUSCULAR VOLUME: 82.8 fL (ref 80.0–100.0)
MEAN PLATELET VOLUME: 8.5 fL (ref 7.0–10.0)
MONOCYTES ABSOLUTE COUNT: 0.7 10*9/L (ref 0.2–0.8)
MONOCYTES RELATIVE PERCENT: 7.2 %
NEUTROPHILS RELATIVE PERCENT: 67.3 %
PLATELET COUNT: 250 10*9/L (ref 150–440)
RED BLOOD CELL COUNT: 5.42 10*12/L (ref 4.50–5.90)
RED CELL DISTRIBUTION WIDTH: 15.5 % — ABNORMAL HIGH (ref 12.0–15.0)
WBC ADJUSTED: 9.5 10*9/L (ref 4.5–11.0)

## 2019-04-17 LAB — COMPREHENSIVE METABOLIC PANEL
ALBUMIN: 4.7 g/dL (ref 3.5–5.0)
ALKALINE PHOSPHATASE: 113 U/L (ref 38–126)
ALT (SGPT): 22 U/L (ref <50)
ANION GAP: 8 mmol/L (ref 7–15)
AST (SGOT): 24 U/L (ref 19–55)
BILIRUBIN TOTAL: 0.3 mg/dL (ref 0.0–1.2)
BLOOD UREA NITROGEN: 27 mg/dL — ABNORMAL HIGH (ref 7–21)
BUN / CREAT RATIO: 28
CALCIUM: 9.4 mg/dL (ref 8.5–10.2)
CREATININE: 0.95 mg/dL (ref 0.70–1.30)
EGFR CKD-EPI AA MALE: >90 mL/min/{1.73_m2} (ref >=60)
EGFR CKD-EPI NON-AA MALE: 89 mL/min/{1.73_m2} (ref >=60)
GLUCOSE RANDOM: 119 mg/dL — ABNORMAL HIGH (ref 70–99)
POTASSIUM: 4.8 mmol/L (ref 3.5–5.0)
PROTEIN TOTAL: 7.5 g/dL (ref 6.5–8.3)
SODIUM: 140 mmol/L (ref 135–145)

## 2019-04-17 LAB — GAMMA GLUTAMYL TRANSFERASE: Gamma glutamyl transferase:CCnc:Pt:Ser/Plas:Qn:: 46

## 2019-04-17 LAB — BILIRUBIN DIRECT: Bilirubin.glucuronidated+Bilirubin.albumin bound:MCnc:Pt:Ser/Plas:Qn:: <0.1

## 2019-04-17 LAB — TACROLIMUS, TROUGH: Lab: 6.5

## 2019-04-17 LAB — NEUTROPHILS RELATIVE PERCENT: Neutrophils/100 leukocytes:NFr:Pt:Bld:Qn:Automated count: 67.3

## 2019-04-17 LAB — CALCIUM: Calcium:MCnc:Pt:Ser/Plas:Qn:: 9.4

## 2019-04-17 LAB — CHOLESTEROL/HDL RATIO SCREEN: Lab: 3.7

## 2019-04-17 LAB — PHOSPHORUS: Phosphate:MCnc:Pt:Ser/Plas:Qn:: 3.3

## 2019-04-17 LAB — VITAMIN D, TOTAL (25OH): Lab: 34.9

## 2019-04-17 LAB — AFP-TUMOR MARKER: Alpha-1-Fetoprotein.tumor marker:MCnc:Pt:Ser/Plas:Qn:: 1

## 2019-04-17 LAB — TESTOSTERONE TOTAL: Testosterone:MCnc:Pt:Ser/Plas:Qn:: 196

## 2019-04-17 LAB — MAGNESIUM: Magnesium:MCnc:Pt:Ser/Plas:Qn:: 1.8

## 2019-04-17 LAB — HEMOGLOBIN A1C: Hemoglobin A1c/Hemoglobin.total:MFr:Pt:Bld:Qn:: 6 — ABNORMAL HIGH

## 2019-04-17 MED ORDER — TACROLIMUS XR 1 MG TABLET,EXTENDED RELEASE 24 HR
ORAL_TABLET | Freq: Every day | ORAL | 3 refills | 30 days | Status: CP
Start: 2019-04-17 — End: ?
  Filled 2019-05-04: qty 60, 30d supply, fill #0

## 2019-04-17 MED ORDER — TACROLIMUS XR 0.75 MG TABLET,EXTENDED RELEASE 24 HR
ORAL_TABLET | Freq: Every day | ORAL | 3 refills | 30 days | Status: CP
Start: 2019-04-17 — End: ?

## 2019-04-17 MED ORDER — PRAVASTATIN 40 MG TABLET
ORAL_TABLET | Freq: Every evening | ORAL | 3 refills | 90 days | Status: CP
Start: 2019-04-17 — End: 2020-04-16
  Filled 2019-05-04: qty 90, 90d supply, fill #0

## 2019-04-17 MED ORDER — FUROSEMIDE 20 MG TABLET
ORAL_TABLET | Freq: Every day | ORAL | 11 refills | 30 days | PRN
Start: 2019-04-17 — End: 2020-04-16

## 2019-04-17 NOTE — Unmapped (Signed)
[facility] TRANSPLANT CLINIC PHARMACY NOTE  04/16/2019   Anthony Alvarado  [id number]     Medication changes today:   1. Start pravastatin 40 mg daily  2. Change furosemide to 40 mg daily prn    Education/Adherence tools provided today:  1.provided updated medication list  2. provided additional pill box education  3.  provided additional education on immunosuppression and transplant related medications including reviewing indications of medications, dosing and side effects    Follow up items:  1. goal of understanding indications and dosing of immunosuppression medications  2. Transition to Envarsus once uses up current supply of Prograf (rx sent to [facility] today)  3. Consider decrease Myfortic dose once Envarsus level stable/theraepeutic given hx HCC  4. Testosterone level (pending)    Next visit with pharmacy in 1-3 months prn  ____________________________________________________________________    Anthony Alvarado is a 59 y.o. male s/p orthotopic liver transplant on 09/16/2018 (Liver) 2/2 cryptogenic cirrhosis w/HCC.    Other PMH significant for HTN, T2DM, OSA, aortic stenosis    Seen by pharmacy today for: medication management and pill box fill and adherence education; last seen by pharmacy 2 months ago     CC:  Patient has no complaints today     Vitals:    04/17/19 1237   BP: 125/84   Pulse: 69   Temp: 36.6 ??C (97.9 ??F)       Allergies   Allergen Reactions   ??? Watermelon Flavor      Mouth itching       All medications reviewed and updated.     Medication list includes revisions made during today???s encounter    Outpatient Encounter Medications as of 04/17/2019   Medication Sig Dispense Refill   ??? acetaminophen (TYLENOL) 325 MG tablet Take 2 tablets (650 mg total) by mouth every six (6) hours as needed for pain or fever (> 38C or 100.27F). 100 tablet 0   ??? alendronate (FOSAMAX) 70 MG tablet Take 1 tablet (70 mg total) by mouth every seven (7) days. (Patient not taking: Reported on 03/07/2019) 4 tablet 11   ??? amLODIPine (NORVASC) 5 MG tablet Take 2 tablets (10 mg total) by mouth daily. 30 tablet 11   ??? aspirin (ECOTRIN) 81 MG tablet Take 1 tablet (81 mg total) by mouth daily. 30 tablet 11   ??? azelastine (ASTELIN) 137 mcg (0.1 %) nasal spray 1 spray by Each Nare route daily as needed.      ??? blood sugar diagnostic Strp Use as directed Three (3) times a day before meals. 90 each 11   ??? calcium carbonate (TUMS) 200 mg calcium (500 mg) chewable tablet Chew 1 tablet Three (3) times a day as needed for heartburn.      ??? cholecalciferol, vitamin D3, 1,000 unit (25 mcg) tablet Take 2 tablets (2,000 Units total) by mouth daily. 60 tablet 11   ??? docusate sodium (COLACE) 100 MG capsule Take 1 capsule (100 mg total) by mouth two (2) times a day as needed for constipation. 60 capsule 0   ??? fluticasone propionate (FLONASE) 50 mcg/actuation nasal spray PLACE ONE OR TWO SPRAYS INTO BOTH NOSTRILS DAILY AS NEEDED.     ??? furosemide (LASIX) 20 MG tablet Take 2 tablets (40 mg total) by mouth daily. 60 tablet 11   ??? gabapentin (NEURONTIN) 300 MG capsule Take 2 capsules (600 mg total) by mouth Three (3) times a day. 540 capsule 3   ??? glipiZIDE (GLUCOTROL XL) 5 MG  24 hr tablet      ??? lancets 33 gauge Misc 1 each by Miscellaneous route Three (3) times a day before meals. 100 each 11   ??? levothyroxine (SYNTHROID) 150 MCG tablet Take 1 tablet (150 mcg total) by mouth daily. 30 tablet 11   ??? lidocaine (LIDODERM) 5 % patch Place 1 patch on the skin daily. Apply to affected area for 12 hours only each day (then remove patch) 30 patch 2   ??? magnesium oxide-Mg AA chelate (MAGNESIUM, AMINO ACID CHELATE,) 133 mg Tab Take 1 tablet by mouth Two (2) times a day. HOLD until directed to start by your coordinator. 60 tablet 11   ??? mycophenolate (MYFORTIC) 180 MG EC tablet Take 2 tablets (360 mg total) by mouth Two (2) times a day. 120 tablet 11   ??? sertraline (ZOLOFT) 25 MG tablet Take 1 tablet (25 mg total) by mouth daily. 30 tablet 11   ??? [EXPIRED] sulfamethoxazole-trimethoprim (BACTRIM) 400-80 mg per tablet Take 1 tablet (80 mg of trimethoprim total) by mouth 3 (three) times a week. 12 tablet 5   ??? tacrolimus (PROGRAF) 0.5 MG capsule Take 5 capsules (2.5 mg total) by mouth two (2) times a day. 300 capsule 11     Facility-Administered Encounter Medications as of 04/17/2019   Medication Dose Route Frequency Provider Last Rate Last Admin   ??? diazePAM (VALIUM) 5 mg/mL injection                Induction agent : basiliximab    CURRENT IMMUNOSUPPRESSION: tacrolimus 2.5 mg PO bid  prograf/Envarsus/cyclosporine goal: 6-8   [id number]  mg PO bid    steroid free     Patient complains of mild tremor (unchanged from prior), states that tacrolimus copay is $80/month and is expensive     IMMUNOSUPPRESSION DRUG LEVELS:  Lab Results   Component Value Date    Tacrolimus, Trough 7.9 12/28/2018    Tacrolimus, Trough 11.0 12/27/2018    Tacrolimus, Trough 8.0 12/26/2018    Tacrolimus Lvl 4.9 04/11/2019    Tacrolimus Lvl 5.6 04/04/2019    Tacrolimus Lvl 7.2 03/27/2019     No results found for: CYCLO  No results found for: EVEROLIMUS  No results found for: SIROLIMUS    Prograf level is accurate 12 hour trough    Graft function: stable  DSA: ntd   Explant biopsy: well differentiated HCC with no vascular invasion, focal high grade dysplastic nodule with small cell change in segment V  Biopsies to date: ntd  WBC/ANC:  wnl     Plan: Will maintain current immunosuppression. Will transition to Envarsus once uses up current supply of Prograf given high copay with Prograf (rx sent today). Once transitioned to Envarsus with stable and therapeutic level, consider decreasing Myfortic dose given hx hcc. Continue to monitor.    OI Prophylaxis:   CMV Status: D-/ R+, moderate risk. CMV prophylaxis: valganciclovir 450 mg daily x 3 months (complete)  No results found for: CMVCP  PCP Prophylaxis: bactrim SS 1 tab MWF x 6 months (complete)   Thrush:  completed in hospital  Patient is  off prophylaxis Plan: prophylaxis completed. Continue to monitor.    CV Prophylaxis: asa 81 mg   The 10-year ASCVD risk score Denman George DC Jr., et al., 2013) is: 9.1%  Statin therapy: Indicated; currently on no statin  Plan: start pravastatin 40 mg daily. Continue to monitor     BP: Goal < 140/90. Clinic vitals reported above  Home BP ranges: 130-135s/80s  Current meds include: amlodipine 10 mg daily, furosemide 40 mg daily   Plan: within goal. Change furosemide to 40 mg daily prn swelling. Continue to monitor    Anemia:  H/H:   Lab Results   Component Value Date    HGB 13.6 04/11/2019     Lab Results   Component Value Date    HCT 41.0 04/11/2019     Iron panel:  Lab Results   Component Value Date    IRON 157 01/04/2018    TIBC 305.9 01/04/2018    FERRITIN 116.0 01/04/2018     Lab Results   Component Value Date    Iron Saturation (%) 51 (H) 01/04/2018       Prior ESA use: none post transplant    Plan: stable, within goal. Continue to monitor.     DM:   Lab Results   Component Value Date    A1C 5.9 (H) 12/19/2018   . Goal A1c < 7  History of Dm? Yes: T2DM  Established with endocrinologist/PCP for BG managment? Yes: PCP  Currently on: N/A  Home BS log: did not bring, per pt memory FBG all in low 100s  Diet:did not address  Exercise: working on cars  Hypoglycemia: no  Plan:  no indication for treatment at this time. If warrants treatment in the future, would consider metformin. If at any point would consider changing agents, Januvia is preferred DPP4 through insurance.     Electrolytes: wnl  Meds currently on: none  Plan: Continue to monitor     GI/BM: Pt reports 1-2BM daily with occasional diarrhea  Meds currently on: TUMS prn (takes a few times per week)  Plan: Continue to monitor     Pain: pt reports moderate pain; abdominal pain is below ribcage while mid-back pain may be MSK  Meds currently on: APAP (no recent use), gabapentin 600 mg BID (not taking - self-stopped this past week after tapering off week prior)  Plan: Continue to monitor off gabapentin, removed from med list. Continue to monitor    Bone health:   Vitamin D Level: 38.9 on 12/19/18. Goal > 30.   Last DEXA results: osteoporosis of spine 01/04/18 - Spine T score - 2.6, femur T score -1.3  Current meds include: cholecalciferol 2000 units daily, alendronate 70 mg weekly   Plan: Vitamin D level  within goal,continue supplementation. Continue to monitor.     Women's/Men's Health:  Anthony Alvarado is a 60 y.o. male. Patient reports night sweats  Plan: Testosterone level ordered today. Continue to monitor    Hypothyroidism  Meds currently on: levothyroxine 150 mcg  TSH 2.97 FT4 1.35 on 01/17/19  Plan: Continue to monitor    Anxiety/depression  Meds currently on: sertraline 25 mg daily  Plan: Continue to monitor    Allergies  Meds currently on: azelastin PRN, Flonase PRN  Plan: continue to monitor     Rosacea  Meds currently on: none (previously on metrogel PRN and Eucrisa ointment)  Plan: Continue to monitor    Adherence: Patient has excellent understanding of medications; was able to independently identify names/doses of immunosuppressants and OI meds.  Patient  does fill their own pill box on a regular basis at home.  Patient brought medication card:no  Pill box:did not bring  Plan: Provided basic adherence counseling/intervention    Spent approximately 20 minutes on educating this patient and greater than 50% was spent in direct face to face counseling regarding post transplant medication education. Questions and concerns were  address to patient's satisfaction.    Patient was reviewed with Gertie Fey, FNP who was agreement with the stated plan:     During this visit, the following was completed:   BG log data assessment  BP log data assessment  Labs ordered and evaluated  complex treatment plan >1 DS   Patient education was completed for 11-24 minutes     All questions/concerns were addressed to the patient's satisfaction.  __________________________________________  Jonette Pesa, PharmD  PGY2 Solid Organ Transplant Pharmacy Resident    Cleone Slim, PHARMD, CPP  SOLID ORGAN TRANSPLANT CLINICAL PHARMACIST PRACTITIONER  PAGER [phone]

## 2019-04-17 NOTE — Unmapped (Signed)
Per provider, the patient received Pneumonia 13 valent vaccine.  Patient ID verified with name and date of birth.  All screening questions were answered.  Vaccine(s) were administered as ordered.  See immunization history for documentation.  Patient tolerated the injection(s) well with no issues noted.  Vaccine Information sheet given to the patient.

## 2019-04-17 NOTE — Unmapped (Signed)
Per test claim for Envarsus at the [facility], patient needs Medication Assistance Program for High Copay.

## 2019-04-17 NOTE — Unmapped (Signed)
error 

## 2019-04-17 NOTE — Unmapped (Signed)
POST SEDATION GUIDELINES/DISCHARGE INSTRUCTIONS    The medicines given to you today for your procedure will stay in your body for some time. You may feel dizzy or lose your sense of balance. The use of your muscles may be changed. Your judgment may be affected. Your reaction time, for example, when driving a car, will be slower. You may not see any of these changes in yourself. In general, you should completely recover from these medicines by tomorrow. For your own safety, we have some strict rules for you to follow for the next 12 hours.    The 7D???s  Do not DRIVE.  Do not use appliances or equipment that could be DANGEROUS, like power tools, stove, burners, lawnmowers, garbage disposals.   Watch out for DIZZINESS. Walk slowly and take your time. Sudden changes of position can also cause nausea.   Do not make any important DECISIONS. You may change your mind tomorrow.   Do not DRINK alcoholic beverages. The drugs in your body may cause your reaction to alcohol to be dangerous.   DIET: If you feel nauseated or ???sick to your stomach???, drink clear liquids like 7-Up, broth, apple juice, ginger ale, tea, and cola or eat jello. If these liquids do not make you ???sick to your stomach???, try eating soft foods like potatoes, rice, pasta and cereal. Tomorrow you can eat regular foods.  DISCUSS any questions you may have with your primary care doctor or the doctor who ordered your test today.

## 2019-04-17 NOTE — Unmapped (Signed)
Outpatient Adult Nutrition-Transplant Follow Up    Referring Provider: [name]    Reason for Referral: s/p liver transplant  09/16/2018    PMH:   Patient Active Problem List   Diagnosis   ??? Diabetes mellitus without complication (CMS-HCC)   ??? Hypothyroidism   ??? Cirrhosis of liver without ascites (CMS-HCC)   ??? OSA (obstructive sleep apnea)   ??? Essential hypertension   ??? Encounter for pre-transplant evaluation for chronic liver disease   ??? Pleural effusion   ??? Pneumothorax after biopsy   ??? Liver transplant recipient (CMS-HCC)   ??? Allergic rhinitis   ??? Arthralgia   ??? Asthma   ??? Bicuspid aortic valve   ??? Flying phobia   ??? Mucosal abnormality of stomach   ??? Nephrolithiasis   ??? Pure hypercholesterolemia   ??? Umbilical hernia   ??? Unspecified hearing loss   ??? COVID-19   cirrhosis c/b HE, possible HCC    Anthropometrics:   Estimated body mass index is 28.36 kg/m?? as calculated from the following:    Height as of an earlier encounter on 04/17/19: 172.7 cm (5' 8).    Weight as of an earlier encounter on 04/17/19: 84.6 kg (186 lb 8 oz).  IBW: 72.6 kg  %IBW: 116%   Goal: maintenance    Weight History: 6.4% weight gain since last RD visit. Per pt, is now ~186- 188 lbs, he gained unintentional weight d/t the pandemic  Wt Readings from Last 10 Encounters:   04/17/19 84.6 kg (186 lb 8 oz)   04/17/19 84.6 kg (186 lb 8 oz)   03/07/19 85.5 kg (188 lb 6.4 oz)   02/28/19 84.5 kg (186 lb 3.2 oz)   01/31/19 80.4 kg (177 lb 4.8 oz)   01/23/19 78.8 kg (173 lb 11.2 oz)   01/11/19 81.4 kg (179 lb 7.3 oz)   01/05/19 81.4 kg (179 lb 6.4 oz)   12/28/18 79.5 kg (175 lb 4.3 oz)   12/26/18 78.9 kg (174 lb)     Nutrition-Focused Physical Findings:   Unable to complete at this time due to limiting interactions between patient and provider during infectious outbreak in high risk population.    Relevant Medications, Herbs, Supplements include:  Tums, cholecalciferol, colace, lasix, magnesium oxide, levothyroxine    Relevant Labs: A1c= 5.9 in October 2020    Lab Results   Component Value Date    A1C 6.0 (H) 04/17/2019     Physical Activity: fair per pt, as he still has some pain. He also reports not being able to work out as much with the pandemic and the cold weather.      Dietary Restrictions, Intolerances: none at this time      Allergies:   Allergies   Allergen Reactions   ??? Watermelon Flavor      Mouth itching     Hunger and Satiety: good appetite per pt, nothing bothers him     24-Hour recall/usual intake:   1st - bran flakes w/ scrambled eggs  Snack - none  2nd - tomato soup or chicken broth w/ pinto/lima beans, pizza or spaghetti once a week   Snack - none  3rd - grilled cheese sandwich  Snack - none  Beverages - diet coke a few times per week, ~3-4 bottles of water per day     Other Usual Intake: fish sandwiches on Saturdays, tacos once a week, peanut butter crackers    Nutrition History: Pt denies n/v/d/c. PO intake has improved and he  reports gaining more weight since last visit. Pt contributes weight gain to Covid related depression and boredom. He has been snacking more than he used to do and decreased his water intake.       Estimated Needs:   Estimated Energy Needs: 1860- 2115 kcal/day (22- 25 kcal/kg current weight)  Estimated Protein Needs:  85- 100 g protein/day (1.0- 1.2 g/kg current weight)  Estimated CHO Needs: <45% total kcal  Estimated Fluid Needs:  per MD    Nutrition Assessment: PO intake appears to be excessive at this time. His appetite is much improved and he has been snacking frequently on pretzels and diet soda. His goal is to lose ~6 lbs to get back to 180 lbs, which is where he feels the best. Pt has gained weight mainly in abdomen. Exercise continues to be limited d/t joint pain and cold weather. Discussed strategies to help decrease boredom snacking, and encouraged pt consume more water daily.     Malnutrition Assessment using AND/ASPEN Clinical Characteristics:  Unable to assess at this time as unable to perform updated NFPE    Nutrition Education: general healthful diet     Nutrition Goals:    1. Meet estimated daily needs  2. Balanced macronutrient intake  3. Increase strength/muscle mass    Interventions:    1. Regular diet  2. Continue sodium intake at no more than 2400 mg per day   3. Consume a balanced diet, can decrease focus on protein   4. Increase physical activity  --- aim for walking and strength training for 150 minutes per week  5. Decrease his soda intake to 1 can per day, increase water back up to 6 bottles daily      Materials Provided were: RD contact info    Follow-up: at annual appointment      Length of visit: 16 minutes       Lanelle Bal, RD, LDN  Abdominal Transplant Dietitian   Pager: [phone]

## 2019-04-18 LAB — CMV COMMENT: Lab: 0

## 2019-04-18 LAB — CMV DNA, QUANTITATIVE, PCR

## 2019-04-18 NOTE — Unmapped (Signed)
Patient seen today in clinic for his 6 mos liver txp f/u, delayed d/t prior MRI being schedule without sedation. Patient was driven to clinic by his sister and tolerated his imaging procedure with issue. He reports he has been doing well, since his dx with covid last month and his prior lung surgery. He reports his covid sx were mild, similar to cold sx without fever. He denies having any new dx, aside from occasional diarrhea. Explained this is a common SE of myfortic., but does not cause watery diarrhea. Patient continues to report having drenching night sweats, which preceded txp. He also reports low stamina, back pain with standing and some pulling sensation to his RUQ, which he states have worsened since txp. Mr.Cervantes reports numbness to upper right flank near surgery site.He is not taking any analgesia for this. He denies any swelling, hernia issues, n/v/fever or chills.Patiet reports he is hydrating well, and his creatinine is good. He denies use of tobacco, etoh or illicit drugs. Today's tac level is reported as reliable, he denied missing any doses in the last week. He does endorse monthly copays of $85 which are a financial strain. Will f/u with txp pharm for recommendations. Educated patient for 10 minutes on specifics of post txp self-care, including sun protection, annual dermatology surveillance and importance of exercise/healthy dietary habits for wt.stablization. Patient has gained 15-20 lb since txp. He verbalized understanding of all discussed. Patient also has appts with txp nutrition, pharmacy, and surgery NP.

## 2019-04-18 NOTE — Unmapped (Addendum)
Per NP order placed for EBV PCR and free testosterone to [facility]. Per [facility] lab, ebv could be added onto present lab sample, but no correct sample tube available for free testosterone sendout.

## 2019-04-18 NOTE — Unmapped (Signed)
[facility] Specialty Medication Onboarding    Specialty Medication: ENVARSUS 0.75MG  AND 1MG  TABLETS  Prior Authorization: Not Required   Financial Assistance: Yes - copay card approved as secondary   Final Copay/Day Supply: $0 / 30 DAYS EACH    Insurance Restrictions: Yes - max 1 month supply     Notes to Pharmacist: MEDICATION CHANGE FROM TAC CAPSULES    The triage team has completed the benefits investigation and has determined that the patient is able to fill this medication at [facility]. Please contact the patient to complete the onboarding or follow up with the prescribing physician as needed.

## 2019-04-19 ENCOUNTER — Other Ambulatory Visit: Payer: Self-pay | Admitting: Family Medicine

## 2019-04-19 LAB — EBV VIRAL LOAD RESULT: Lab: NOT DETECTED

## 2019-04-19 NOTE — Unmapped (Signed)
Patient's 6 mos MRI reviewed by Dr.Desai. AFP within normal limits. Will continue with serial HCC monitoring per guidelines.

## 2019-04-24 NOTE — Unmapped (Addendum)
3/1 update: pt also requested 2020 expense report sent to his home. Spoke with SY in order entry and verified that message needs to be sent to specialty pharmacy email to request this. Request sent today for pt to receive this per his request -Massie Maroon    [facility] Clinical Assessment & Refill Coordination Note    Anthony Alvarado, DOB: June 07, 1960  Phone: [phone] (home)     All above HIPAA information was verified with patient.     Was a Nurse, learning disability used for this call? No    Specialty Medication(s):   Transplant: Envarsus 1mg , Envarsus 0.75mg ,  mycophenolic acid 180mg  and tacrolimus 0.5mg      Current Outpatient Medications   Medication Sig Dispense Refill   ??? acetaminophen (TYLENOL) 325 MG tablet Take 2 tablets (650 mg total) by mouth every six (6) hours as needed for pain or fever (> 38C or 100.27F). (Patient not taking: Reported on 04/17/2019) 100 tablet 0   ??? alendronate (FOSAMAX) 70 MG tablet Take 1 tablet (70 mg total) by mouth every seven (7) days. 4 tablet 11   ??? amLODIPine (NORVASC) 5 MG tablet Take 2 tablets (10 mg total) by mouth daily. 30 tablet 11   ??? aspirin (ECOTRIN) 81 MG tablet Take 1 tablet (81 mg total) by mouth daily. 30 tablet 11   ??? azelastine (ASTELIN) 137 mcg (0.1 %) nasal spray 1 spray by Each Nare route daily as needed.      ??? blood sugar diagnostic Strp Use as directed Three (3) times a day before meals. 90 each 11   ??? calcium carbonate (TUMS) 200 mg calcium (500 mg) chewable tablet Chew 1 tablet Three (3) times a day as needed for heartburn.      ??? cholecalciferol, vitamin D3, 1,000 unit (25 mcg) tablet Take 2 tablets (2,000 Units total) by mouth daily. 60 tablet 11   ??? docusate sodium (COLACE) 100 MG capsule Take 1 capsule (100 mg total) by mouth two (2) times a day as needed for constipation. 60 capsule 0   ??? fluticasone propionate (FLONASE) 50 mcg/actuation nasal spray PLACE ONE OR TWO SPRAYS INTO BOTH NOSTRILS DAILY AS NEEDED.     ??? furosemide (LASIX) 20 MG tablet Take 2 tablets (40 mg total) by mouth daily as needed for swelling. 60 tablet 11   ??? lancets 33 gauge Misc 1 each by Miscellaneous route Three (3) times a day before meals. 100 each 11   ??? levothyroxine (SYNTHROID) 150 MCG tablet Take 1 tablet (150 mcg total) by mouth daily. 30 tablet 11   ??? magnesium oxide-Mg AA chelate (MAGNESIUM, AMINO ACID CHELATE,) 133 mg Tab Take 1 tablet by mouth Two (2) times a day. HOLD until directed to start by your coordinator. (Patient not taking: Reported on 04/17/2019) 60 tablet 11   ??? mycophenolate (MYFORTIC) 180 MG EC tablet Take 2 tablets (360 mg total) by mouth Two (2) times a day. 120 tablet 11   ??? pravastatin (PRAVACHOL) 40 MG tablet Take 1 tablet (40 mg total) by mouth every evening. 90 tablet 3   ??? sertraline (ZOLOFT) 25 MG tablet Take 1 tablet (25 mg total) by mouth daily. 30 tablet 11   ??? tacrolimus (ENVARSUS XR) 0.75 mg Tb24 extended release tablet Take 2 tablets (1.5 mg total) by mouth daily with 2 (1 mg) tablets for a total dose of 3.5 mg daily 60 tablet 3   ??? tacrolimus (ENVARSUS XR) 1 mg Tb24 extended release tablet Take 2  tablets (2 mg) by mouth daily with 2 (0.75 mg) tablets for a total dose of 3.5 mg daily 60 tablet 3   ??? tacrolimus (PROGRAF) 0.5 MG capsule Take 5 capsules (2.5 mg total) by mouth two (2) times a day. 300 capsule 11     Current Facility-Administered Medications   Medication Dose Route Frequency Provider Last Rate Last Admin   ??? diazePAM (VALIUM) injection 5 mg  5 mg Intravenous Q10 Min PRN Pierce Crane, MD         Facility-Administered Medications Ordered in Other Visits   Medication Dose Route Frequency Provider Last Rate Last Admin   ??? diazePAM (VALIUM) 5 mg/mL injection                 Changes to medications: see envarsus/tac below    Allergies   Allergen Reactions   ??? Watermelon Flavor      Mouth itching       Changes to allergies: No    SPECIALTY MEDICATION ADHERENCE     envarsus 1mg   : 0 days of medicine on hand will start on next shipment  envarsus 0.75mg   : 0 days of medicine on hand will start on next shipment  tacrolimus 0.5mg   : 20 days of medicine on hand   Mycophenolate 180mg   : 20 days of medicine on hand       Medication Adherence    Patient reported X missed doses in the last month: 0  Specialty Medication: mycophenolate 180mg   Patient is on additional specialty medications: Yes  Additional Specialty Medications: Tacrolimus 0.5mg   Patient Reported Additional Medication X Missed Doses in the Last Month: 0  Patient is on more than two specialty medications: Yes  Specialty Medication: envarsus 1mg   Patient Reported Additional Medication X Missed Doses in the Last Month: 0  Specialty Medication: envarsus 0.75mg   Patient Reported Additional Medication X Missed Doses in the Last Month: 0          Specialty medication(s) dose(s) confirmed: pt is currently on tac but will switch to envarsus on next fill     Are there any concerns with adherence? No    Adherence counseling provided? Not needed    CLINICAL MANAGEMENT AND INTERVENTION      Clinical Benefit Assessment:    Do you feel the medicine is effective or helping your condition? Yes    Clinical Benefit counseling provided? Not needed    Adverse Effects Assessment:    Are you experiencing any side effects? No    Are you experiencing difficulty administering your medicine? No    Quality of Life Assessment:    How many days over the past month did your transplant  keep you from your normal activities? For example, brushing your teeth or getting up in the morning. 0    Have you discussed this with your provider? Not needed    Therapy Appropriateness:    Is therapy appropriate? Yes, therapy is appropriate and should be continued    DISEASE/MEDICATION-SPECIFIC INFORMATION      N/A    PATIENT SPECIFIC NEEDS     ? Does the patient have any physical, cognitive, or cultural barriers? No    ? Is the patient high risk? Yes, patient is taking a REMS drug. Medication is dispensed in compliance with REMS program.     ? Does the patient require a Care Management Plan? No     ? Does the patient require physician intervention or other additional services (i.e. nutrition,  smoking cessation, social work)? No      SHIPPING     Specialty Medication(s) to be Shipped:   na    Other medication(s) to be shipped: na  Pt wants call back in 1.5-2 weeks     Changes to insurance: No    Delivery Scheduled: Patient declined refill at this time due to pt wants call back in 1.5-2 weeks.     Medication will be delivered via na to the confirmed na address in [place].    The patient will receive a drug information handout for each medication shipped and additional FDA Medication Guides as required.  Verified that patient has previously received a Conservation officer, historic buildings.    All of the patient's questions and concerns have been addressed.    [name]   [facility] Specialty Pharmacist

## 2019-04-24 NOTE — Unmapped (Signed)
The following medication was onboarded in this note:  1. Envarus 1 and 0.75mg     [facility]   Patient Onboarding/Medication Counseling    [name] is a 59 y.o. male with liver transplant who I am counseling today on initiation of therapy.  I am speaking to the patient.    Was a Nurse, learning disability used for this call? No    Verified patient's date of birth / HIPAA.    Specialty medication(s) to be sent: na      Non-specialty medications/supplies to be sent: na      Medications not needed at this time: na  See clinical note from today       Envarsus (tacrolimus XR)    Medication & Administration     Dosage: take 3.5mg  total by mouth once daily (2x1mg  tablets and 2x0.75mg  tablets per each dose)     Administration:   ? Take in the morning on empty stomach ??? 1 hour before or 2 hours after breakfast  ? Take with drink of water  ? Do not crush, break, or chew tablets    Adherence/Missed dose instructions:  ? Take a missed dose as soon as you think about it.  ? If it has been more than 15 hours since missed dose, skip the missed dose and go back to your regular time  ? Report all missed doses to transplant coordinator    Goals of Therapy     ? To prevent organ rejection    Side Effects & Monitoring Parameters     ? Common side effects  ? Fatigue  ? Headache  ? Stuffy nose or sore throat  ? Nausea, vomiting, stomach pain, diarrhea, constipation  ? Heartburn  ? Back or joint pain  ? Increased risk of infection    ? The following side effects should be reported to the provider:  ? Allergic reaction  ? Kidney issues (change in quantity or urine passed, blood in urine, or weight gain)  ? High blood pressure (dizziness, change in eyesight, headache)  ? Electrolyte issues (change in mood, confusion, muscle pain, or weakness)  ? Abnormal breathing  ? Shakiness  ? Unexplained bleeding or bruising (gums bleeding, blood in urine, nosebleeds, any abnormal bleeding)  ? Signs of infection    ? Monitoring Parameters  ? Renal function  ? Liver function  ? Glucose levels  ? Blood pressure  ? Tacrolimus trough levels      Contraindications, Warnings, & Precautions     ? Black Box Warning: Infections - immunosuppressant agents increase the risk of infection that may lead to hospitalization or death  ? Black Box Warning: Malignancy - immunosuppressant agents may be associated with the development of malignancies that may lead to hospitalization or death.  Limit or avoid sun and ultraviolet light exposure, use appropriate sun protection  ? Myocardial hypertrophy -avoid use in patients with congenital long QT syndrome  ? Diabetes mellitus - the risk for new-onset diabetes and insulin-dependent post-transplant diabetes mellitus is increased with tacrolimus use after transplantation  ? GI perforation  ? Hyperkalemia  ? Hypertension  ? Nephrotoxicity  ? Neurotoxicity    Drug/Food Interactions     ? Medication list reviewed in Epic. no interactions noted that clinic is not already monitoring.   ? Due to amount and severity of drug interactions, report ALL medications starts, discontinuations, and changes to transplant coordinator prior to making the change  ? Avoid alcohol  ? Avoid grapefruit or grapefruit juice  ?  Avoid live vaccines    Storage, Handling Precautions, & Disposal     ? Store at room temperature  ? Keep away from children and pets      Current Medications (including OTC/herbals), Comorbidities and Allergies     Current Outpatient Medications   Medication Sig Dispense Refill   ??? acetaminophen (TYLENOL) 325 MG tablet Take 2 tablets (650 mg total) by mouth every six (6) hours as needed for pain or fever (> 38C or 100.27F). (Patient not taking: Reported on 04/17/2019) 100 tablet 0   ??? alendronate (FOSAMAX) 70 MG tablet Take 1 tablet (70 mg total) by mouth every seven (7) days. 4 tablet 11   ??? amLODIPine (NORVASC) 5 MG tablet Take 2 tablets (10 mg total) by mouth daily. 30 tablet 11   ??? aspirin (ECOTRIN) 81 MG tablet Take 1 tablet (81 mg total) by mouth daily. 30 tablet 11   ??? azelastine (ASTELIN) 137 mcg (0.1 %) nasal spray 1 spray by Each Nare route daily as needed.      ??? blood sugar diagnostic Strp Use as directed Three (3) times a day before meals. 90 each 11   ??? calcium carbonate (TUMS) 200 mg calcium (500 mg) chewable tablet Chew 1 tablet Three (3) times a day as needed for heartburn.      ??? cholecalciferol, vitamin D3, 1,000 unit (25 mcg) tablet Take 2 tablets (2,000 Units total) by mouth daily. 60 tablet 11   ??? docusate sodium (COLACE) 100 MG capsule Take 1 capsule (100 mg total) by mouth two (2) times a day as needed for constipation. 60 capsule 0   ??? fluticasone propionate (FLONASE) 50 mcg/actuation nasal spray PLACE ONE OR TWO SPRAYS INTO BOTH NOSTRILS DAILY AS NEEDED.     ??? furosemide (LASIX) 20 MG tablet Take 2 tablets (40 mg total) by mouth daily as needed for swelling. 60 tablet 11   ??? lancets 33 gauge Misc 1 each by Miscellaneous route Three (3) times a day before meals. 100 each 11   ??? levothyroxine (SYNTHROID) 150 MCG tablet Take 1 tablet (150 mcg total) by mouth daily. 30 tablet 11   ??? magnesium oxide-Mg AA chelate (MAGNESIUM, AMINO ACID CHELATE,) 133 mg Tab Take 1 tablet by mouth Two (2) times a day. HOLD until directed to start by your coordinator. (Patient not taking: Reported on 04/17/2019) 60 tablet 11   ??? mycophenolate (MYFORTIC) 180 MG EC tablet Take 2 tablets (360 mg total) by mouth Two (2) times a day. 120 tablet 11   ??? pravastatin (PRAVACHOL) 40 MG tablet Take 1 tablet (40 mg total) by mouth every evening. 90 tablet 3   ??? sertraline (ZOLOFT) 25 MG tablet Take 1 tablet (25 mg total) by mouth daily. 30 tablet 11   ??? tacrolimus (ENVARSUS XR) 0.75 mg Tb24 extended release tablet Take 2 tablets (1.5 mg total) by mouth daily with 2 (1 mg) tablets for a total dose of 3.5 mg daily 60 tablet 3   ??? tacrolimus (ENVARSUS XR) 1 mg Tb24 extended release tablet Take 2 tablets (2 mg) by mouth daily with 2 (0.75 mg) tablets for a total dose of 3.5 mg daily 60 tablet 3   ??? tacrolimus (PROGRAF) 0.5 MG capsule Take 5 capsules (2.5 mg total) by mouth two (2) times a day. 300 capsule 11     Current Facility-Administered Medications   Medication Dose Route Frequency Provider Last Rate Last Admin   ??? diazePAM (VALIUM) injection 5 mg  5 mg  Intravenous Q10 Min PRN Pierce Crane, MD         Facility-Administered Medications Ordered in Other Visits   Medication Dose Route Frequency Provider Last Rate Last Admin   ??? diazePAM (VALIUM) 5 mg/mL injection                Allergies   Allergen Reactions   ??? Watermelon Flavor      Mouth itching       Patient Active Problem List   Diagnosis   ??? Diabetes mellitus without complication (CMS-HCC)   ??? Hypothyroidism   ??? Cirrhosis of liver without ascites (CMS-HCC)   ??? OSA (obstructive sleep apnea)   ??? Essential hypertension   ??? Encounter for pre-transplant evaluation for chronic liver disease   ??? Pleural effusion   ??? Pneumothorax after biopsy   ??? Liver transplant recipient (CMS-HCC)   ??? Allergic rhinitis   ??? Arthralgia   ??? Asthma   ??? Bicuspid aortic valve   ??? Flying phobia   ??? Mucosal abnormality of stomach   ??? Nephrolithiasis   ??? Pure hypercholesterolemia   ??? Umbilical hernia   ??? Unspecified hearing loss   ??? COVID-19       Reviewed and up to date in Epic.    Appropriateness of Therapy     Is medication and dose appropriate based on diagnosis? Yes    Prescription has been clinically reviewed: Yes    Baseline Quality of Life Assessment      How many days over the past month did your transplant  keep you from your normal activities? For example, brushing your teeth or getting up in the morning. 0    Financial Information     Medication Assistance provided: Copay Assistance    Anticipated copay of $0 on ins and copay card for each strength reviewed with patient. Verified delivery address.    Delivery Information     Scheduled delivery date: pt wants a call back next week to set up first delivery    Expected start date: pt will contact clinic once med in hand to discuss start date and labwork    Medication will be delivered via na to the na address in [place].  This shipment will not require a signature.      Explained the services we provide at [facility] and that each month we would call to set up refills.  Stressed importance of returning phone calls so that we could ensure they receive their medications in time each month.  Informed patient that we should be setting up refills 7-10 days prior to when they will run out of medication.  A pharmacist will reach out to perform a clinical assessment periodically.  Informed patient that a welcome packet and a drug information handout will be sent.      Patient verbalized understanding of the above information as well as how to contact the pharmacy at [phone] option 4 with any questions/concerns.  The pharmacy is open Monday through Friday 8:30am-4:30pm.  A pharmacist is available 24/7 via pager to answer any clinical questions they may have.    Patient Specific Needs     ? Does the patient have any physical, cognitive, or cultural barriers? No    ? Patient prefers to have medications discussed with  Patient     ? Is the patient or caregiver able to read and understand education materials at a high school level or above? Yes    ?  Patient's primary language is  English     ? Is the patient high risk? Yes, patient is taking a REMS drug. Medication is dispensed in compliance with REMS program.     ? Does the patient require a Care Management Plan? No     ? Does the patient require physician intervention or other additional services (i.e. nutrition, smoking cessation, social work)? No      [name]  [facility] Specialty Pharmacist

## 2019-04-25 DIAGNOSIS — Z5181 Encounter for therapeutic drug level monitoring: Secondary | ICD-10-CM | POA: Diagnosis not present

## 2019-04-25 DIAGNOSIS — Z944 Liver transplant status: Secondary | ICD-10-CM | POA: Diagnosis not present

## 2019-04-25 DIAGNOSIS — R61 Generalized hyperhidrosis: Secondary | ICD-10-CM | POA: Diagnosis not present

## 2019-04-25 DIAGNOSIS — E612 Magnesium deficiency: Secondary | ICD-10-CM | POA: Diagnosis not present

## 2019-04-26 ENCOUNTER — Other Ambulatory Visit: Payer: Self-pay | Admitting: Family Medicine

## 2019-04-26 LAB — COMPREHENSIVE METABOLIC PANEL
A/G RATIO: 2 (ref 1.2–2.2)
ALBUMIN: 4.8 g/dL (ref 3.8–4.9)
ALKALINE PHOSPHATASE: 131 IU/L — ABNORMAL HIGH (ref 39–117)
ALT (SGPT): 24 IU/L (ref 0–44)
AST (SGOT): 21 IU/L (ref 0–40)
BILIRUBIN TOTAL: 0.3 mg/dL (ref 0.0–1.2)
BLOOD UREA NITROGEN: 19 mg/dL (ref 6–24)
BUN / CREAT RATIO: 18 (ref 9–20)
CALCIUM: 9.8 mg/dL (ref 8.7–10.2)
CHLORIDE: 103 mmol/L (ref 96–106)
CO2: 22 mmol/L (ref 20–29)
CREATININE: 1.06 mg/dL (ref 0.76–1.27)
GFR MDRD AF AMER: 90 mL/min/{1.73_m2}
GLUCOSE: 112 mg/dL — ABNORMAL HIGH (ref 65–99)
POTASSIUM: 4.8 mmol/L (ref 3.5–5.2)
SODIUM: 139 mmol/L (ref 134–144)
TOTAL PROTEIN: 7.2 g/dL (ref 6.0–8.5)

## 2019-04-26 LAB — GAMMA GLUTAMYL TRANSFERASE: Gamma glutamyl transferase:CCnc:Pt:Ser/Plas:Qn:: 35

## 2019-04-26 LAB — CBC W/ DIFFERENTIAL
BANDED NEUTROPHILS ABSOLUTE COUNT: 0.1 10*3/uL (ref 0.0–0.1)
BASOPHILS ABSOLUTE COUNT: 0.1 10*3/uL (ref 0.0–0.2)
EOSINOPHILS RELATIVE PERCENT: 3 %
HEMATOCRIT: 41.6 % (ref 37.5–51.0)
HEMOGLOBIN: 14.3 g/dL (ref 13.0–17.7)
IMMATURE GRANULOCYTES: 1 %
LYMPHOCYTES ABSOLUTE COUNT: 1.9 10*3/uL (ref 0.7–3.1)
LYMPHOCYTES RELATIVE PERCENT: 21 %
MEAN CORPUSCULAR HEMOGLOBIN CONC: 34.4 g/dL (ref 31.5–35.7)
MEAN CORPUSCULAR HEMOGLOBIN: 27.7 pg (ref 26.6–33.0)
MEAN CORPUSCULAR VOLUME: 81 fL (ref 79–97)
MONOCYTES ABSOLUTE COUNT: 0.9 10*3/uL (ref 0.1–0.9)
MONOCYTES RELATIVE PERCENT: 11 %
NEUTROPHILS ABSOLUTE COUNT: 5.7 10*3/uL (ref 1.4–7.0)
NEUTROPHILS RELATIVE PERCENT: 63 %
PLATELET COUNT: 236 10*3/uL (ref 150–450)
RED BLOOD CELL COUNT: 5.17 x10E6/uL (ref 4.14–5.80)
RED CELL DISTRIBUTION WIDTH: 14.4 % (ref 11.6–15.4)
WHITE BLOOD CELL COUNT: 8.8 10*3/uL (ref 3.4–10.8)

## 2019-04-26 LAB — EPSTEIN-BARR DNA QUANT, PCR: Epstein Barr virus DNA:NCnc:Pt:Ser/Plas:Qn:Probe.amp.tar: NEGATIVE

## 2019-04-26 LAB — NEUTROPHILS RELATIVE PERCENT: Neutrophils/100 leukocytes:NFr:Pt:Bld:Qn:Automated count: 63

## 2019-04-26 LAB — PHOSPHORUS, SERUM: Phosphate:MCnc:Pt:Ser/Plas:Qn:: 3.5

## 2019-04-26 LAB — SODIUM: Sodium:SCnc:Pt:Ser/Plas:Qn:: 139

## 2019-04-26 LAB — MAGNESIUM: Magnesium:MCnc:Pt:Ser/Plas:Qn:: 1.7

## 2019-04-26 LAB — BILIRUBIN DIRECT: Bilirubin.glucuronidated+Bilirubin.albumin bound:MCnc:Pt:Ser/Plas:Qn:: 0.09

## 2019-04-26 MED ORDER — ONETOUCH ULTRA TEST STRIPS
0.00000 days
Start: 2019-04-26 — End: ?

## 2019-04-26 NOTE — Progress Notes (Signed)
Cardiology Office Note   Date:  05/01/2019   ID:  [name], DOB 1961/02/09, MRN [id number]  PCP:  [name], MD  Cardiologist:   Dorris Carnes, MD   F/U of aortic stenosis.     History of Present Illness: [name] is a 59 y.o. male with a history of aortic stenosis, cirrhosis, HTN.[name]   As pretransplant EV he had cath done   [name] showed 40% D1, 30 to 40% D2; 40 to 50% OM2   LVEF normal   Mild AS     In July he underwent liver transplant at [name] in July showed Peak and mean gradients through the AV were 71 and 37 mm Hg   AVA 1.3cm2.  A limited echo in Nov 2020 showed no effusion.  The pt says his breathing is OK  He denies CP    Notes occsional dizziness       The pt was last in cardiology clinic in march 2020 Current Meds  Medication Sig  . acetaminophen (TYLENOL) 325 MG tablet Take 325 mg by mouth every 6 (six) hours as needed.  [name] amLODipine (NORVASC) 5 MG tablet Take 10 mg by mouth daily.  [name] aspirin EC 81 MG tablet Take 81 mg by mouth daily.  [name] azelastine (ASTELIN) 0.1 % nasal spray PLACE 2 SPRAYS INTO BOTH NOSTRILS TWICE DAILY AS NEEDED  . calcium carbonate (TUMS - DOSED IN MG ELEMENTAL CALCIUM) 500 MG chewable tablet Chew 1 tablet by mouth daily.  . cholecalciferol (VITAMIN D) 25 MCG (1000 UT) tablet Take 1,000 Units by mouth 2 (two) times daily.  . fluticasone (FLONASE) 50 MCG/ACT nasal spray PLACE ONE OR TWO SPRAYS INTO BOTH NOSTRILS DAILY AS NEEDED.  . furosemide (LASIX) 20 MG tablet Take 40 mg by mouth as needed.   [name] levothyroxine (SYNTHROID) 150 MCG tablet Take 1 tablet daily  . metroNIDAZOLE (METROGEL) 1 % gel Apply topically daily.  . mycophenolate (MYFORTIC) 180 MG EC tablet Take 360 mg by mouth 2 (two) times daily.  [name] Lancets [id number] MISC USE TO CHECK SUGAR DAILY  . ONETOUCH ULTRA test strip USE TO CHECK SUGAR DAILY  . tacrolimus (PROGRAF) 1 MG capsule 2.5mg  in the AM and 2.5 mg in the PM per [facility]     Allergies:   Watermelon flavor    Past Medical History:  Diagnosis Date  . Allergy   . Asthma   . Bicuspid aortic valve    sees dr Harrington Challenger  . Cirrhosis ([place])   . Diabetes mellitus without complication ([place])   . Fatty liver    with h/o elevated LFT's  . GERD (gastroesophageal reflux disease)   . Heart murmur   . Hypertension   . Itching    all over last few months  . Jaundice   . Liver transplant recipient [facility])    [facility]/16/2018 [facility]  . Migraine with aura   . OSA (obstructive sleep apnea) 09/14/2011   PSG 11/08/11>>AHI 31.6, SpO2 low 85%. wears CPAP, pt does not know settings  . Other abnormal glucose    diet controlled diabetic  . Thyroid disease     Past Surgical History:  Procedure Laterality Date  . APPENDECTOMY  10/09   Emergency  . BIOPSY THYROID  08/19/07   Attempted, no tissue obtained  . CARDIAC CATHETERIZATION  03/21/2018  . CARDIOVASCULAR STRESS TEST  03/07   Negative 06/05  . DOPPLER ECHOCARDIOGRAPHY  06/04  . ESOPHAGEAL BANDING N/A  05/04/2016   Procedure: ESOPHAGEAL BANDING;  Surgeon: Justin Mayer, MD;  Location: [facility];  Service: Endoscopy;  Laterality: N/A;  . ESOPHAGOGASTRODUODENOSCOPY (EGD) WITH PROPOFOL N/A 05/04/2016   Procedure: ESOPHAGOGASTRODUODENOSCOPY (EGD) WITH PROPOFOL;  Surgeon: Justin Mayer, MD;  Location: [facility];  Service: Endoscopy;  Laterality: N/A;  . LIVER TRANSPLANT     09/16/2018 at [facility] History:  The patient  reports that he has never smoked. He has never used smokeless tobacco. He reports that he does not drink alcohol or use drugs.   Family History:  The patient's family history includes Asthma in his mother; COPD in his father; Cancer in his father; Colon cancer in his maternal grandmother; Heart disease in his maternal grandfather and sister; Liver disease in his sister; Prostate cancer in his maternal uncle.    ROS:  Please see the history of present illness. All other systems are reviewed and  Negative to the above problem except as noted.      PHYSICAL EXAM: VS:  BP 124/84   Pulse 76   Ht 5\' 8"  (1.727 m)   Wt 193 lb (87.5 kg)   SpO2 99%   BMI 29.35 kg/m   GEN: Well nourished, well developed, in no acute distress  HEENT: normal   Mild icterus of sclera Neck: JVP not elevated   No carotid bruits Cardiac: RRR  Gr III/VI systolic murmur base  , rubs, or gallops,no edema  Respiratory:  clear to auscultation bilaterally, normal work of breathing GI: soft, nontender, nondistended, + BS  No hepatomegaly  MS: no deformity Moving all extremities   Skin: warm and dry, no rash  Neuro:  Strength and sensation are intact Psych: euthymic mood, full affect   EKG:  EKG is not ordered today.    Lipid Panel    Component Value Date/Time   CHOL 195 01/11/2017 0821   TRIG 194.0 (H) 01/11/2017 0821   HDL 25.00 (L) 01/11/2017 0821   CHOLHDL 8 01/11/2017 0821   VLDL 38.8 01/11/2017 0821   LDLCALC 131 (H) 01/11/2017 0821   LDLDIRECT 196.0 08/02/2012 0759      Wt Readings from Last 3 Encounters:  04/28/19 193 lb (87.5 kg)  04/27/19 193 lb (87.5 kg)  03/23/19 180 lb (81.6 kg)      ASSESSMENT AND PLAN:  1   Aortic stenosis  Echo in July 2020:   Mean gradient 37 mm Hg   Will repat echo in APril    I am not convincied dizziness is due to valve   Encouraged adequate hydration, following BP  3  Liver dz  S/p Liver Tx    4  CAD   Mild at cath    On ASA  Will need to follow lipids        Plan otherwise for f/u in 1 year     Current medicines are reviewed at length with the patient today.  The patient does not have concerns regarding medicines.  Signed, Dorris Carnes, MD  05/01/2019 12:40 AM    [name], [place], [place]  [address] Phone: [phone]; Fax: [phone]

## 2019-04-26 NOTE — Unmapped (Signed)
Change in Tacrolimus dosage increase. Co-pay $10.00.

## 2019-04-27 ENCOUNTER — Ambulatory Visit (INDEPENDENT_AMBULATORY_CARE_PROVIDER_SITE_OTHER): Payer: BC Managed Care – PPO | Admitting: Family Medicine

## 2019-04-27 ENCOUNTER — Ambulatory Visit: Payer: BC Managed Care – PPO | Admitting: Family Medicine

## 2019-04-27 ENCOUNTER — Other Ambulatory Visit: Payer: Self-pay

## 2019-04-27 ENCOUNTER — Encounter: Payer: Self-pay | Admitting: Family Medicine

## 2019-04-27 VITALS — BP 122/80 | HR 70 | Temp 97.1°F | Ht 68.0 in | Wt 193.0 lb

## 2019-04-27 DIAGNOSIS — E119 Type 2 diabetes mellitus without complications: Secondary | ICD-10-CM | POA: Diagnosis not present

## 2019-04-27 DIAGNOSIS — Z944 Liver transplant status: Secondary | ICD-10-CM

## 2019-04-27 DIAGNOSIS — E039 Hypothyroidism, unspecified: Secondary | ICD-10-CM

## 2019-04-27 LAB — TACROLIMUS BLOOD: Tacrolimus:MCnc:Pt:Bld:Qn:LC/MS/MS: 4.9

## 2019-04-27 LAB — LAB REPORT - SCANNED: A1c: 6

## 2019-04-27 MED ORDER — LEVOTHYROXINE SODIUM 150 MCG PO TABS: ORAL_TABLET | ORAL | Status: DC

## 2019-04-27 MED ORDER — TACROLIMUS 1 MG PO CAPS: ORAL_CAPSULE | ORAL | Status: DC

## 2019-04-27 NOTE — Patient Instructions (Addendum)
Please call about an eye exam.   Thanks for your effort.  Take care.  Glad to see you.  Plan on recheck in about 6 months at a physical, sooner if needed.

## 2019-04-27 NOTE — Progress Notes (Signed)
This visit occurred during the SARS-CoV-2 public health emergency.  Safety protocols were in place, including screening questions prior to the visit, additional usage of staff PPE, and extensive cleaning of exam room while observing appropriate contact time as indicated for disinfecting solutions.  Diabetes:  No meds.   Hypoglycemic episodes: no Hyperglycemic episodes:no Feet problems: no Blood Sugars averaging: usually ~120.   eye exam within last year: due, d/w pt.   A1c 6 on 04/17/19  He still has some fatigue and sweats at night.  He has talked to [facility] about this.  He had lower testosterone level noted and that could be causative.  No FCNAVD.  No jaundice.  No abd pain except for occ twinges and that isn't excalating.  No cough except for some throat clearing.  He has normal LFTs and Cr and Hgb at [facility], d/w pt.  No recent lasix use.    He has some joint aches (hands, L 1st toe, elbows).  He is off prednisone now.  Discussed icing as needed.    Meds, vitals, and allergies reviewed.   ROS: Per HPI unless specifically indicated in ROS section   GEN: nad, alert and oriented HEENT: ncat NECK: supple w/o LA CV: rrr. PULM: ctab, no inc wob ABD: soft, +bs EXT: no edema SKIN: no acute rash  Diabetic foot exam: Normal inspection No skin breakdown No calluses  Normal DP pulses Normal sensation to light touch and monofilament Nails normal

## 2019-04-28 ENCOUNTER — Ambulatory Visit: Payer: BC Managed Care – PPO | Admitting: Internal Medicine

## 2019-04-28 ENCOUNTER — Encounter: Payer: Self-pay | Admitting: Internal Medicine

## 2019-04-28 VITALS — BP 124/84 | HR 76 | Ht 68.0 in | Wt 193.0 lb

## 2019-04-28 DIAGNOSIS — I350 Nonrheumatic aortic (valve) stenosis: Secondary | ICD-10-CM | POA: Diagnosis not present

## 2019-04-28 LAB — FREE TESTOSTERONE(DIRECT): Testosterone.free:MCnc:Pt:Ser/Plas:Qn:: 12.6

## 2019-04-28 NOTE — Unmapped (Addendum)
Patient's tac level subpar this week. Contacted patient, who said he is still taking the 2.5mg  bid tac and has not missed any doses. He said he has not yet received the envarsus. Read pharmacy note, which indicated patient requested delay in shipment until next week. Will continue to monitor tac. Free testosterone and testosterone testing completed at [facility], the latter result was low. Sent to NP Martin-Velez who planned to discuss with Dr.Shah and pharmD Mincemoyer.

## 2019-04-28 NOTE — Patient Instructions (Addendum)
Medication Instructions:  *If you need a refill on your cardiac medications before your next appointment, please call your pharmacy*  Lab Work: If you have labs (blood work) drawn today and your tests are completely normal, you will receive your results only by: [name] MyChart Message (if you have MyChart) OR . A paper copy in the mail If you have any lab test that is abnormal or we need to change your treatment, we will call you to review the results.  Testing/Procedures Your physician has requested that you have an echocardiogram in April. Echocardiography is a painless test that uses sound waves to create images of your heart. It provides your doctor with information about the size and shape of your heart and how well your heart's chambers and valves are working. This procedure takes approximately one hour. There are no restrictions for this procedure.  Follow-Up: At [facility], you and your health needs are our priority.  As part of our continuing mission to provide you with exceptional heart care, we have created designated Provider Care Teams.  These Care Teams include your primary Cardiologist (physician) and Advanced Practice Providers (APPs -  Physician Assistants and Nurse Practitioners) who all work together to provide you with the care you need, when you need it.  We recommend signing up for the patient portal called "MyChart".  Sign up information is provided on this After Visit Summary.  MyChart is used to connect with patients for Virtual Visits (Telemedicine).  Patients are able to view lab/test results, encounter notes, upcoming appointments, etc.  Non-urgent messages can be sent to your provider as well.   To learn more about what you can do with MyChart, go to [email or web address].    Your next appointment:   12 month(s)  The format for your next appointment:   In Person  Provider:   You may see Dorris Carnes, MD or one of the following Advanced Practice Providers on your  designated Care Team:    Richardson Dopp, PA-C  [place], [place]  Daune Perch, [place]

## 2019-04-30 NOTE — Assessment & Plan Note (Addendum)
He still having some occasional joint aches and sweats without clear cause.  I do not know if this is partly related to being off prednisone, where prednisone previously masked some of his symptoms.  I need input from [facility] on this and we will request their help.

## 2019-04-30 NOTE — Assessment & Plan Note (Signed)
His A1c is controlled off medication and we do not need to change his medications at this point.  We can recheck periodically.  Foot care discussed with patient.  Diet and exercise discussed.  He will update me as needed.

## 2019-05-01 DIAGNOSIS — Z5181 Encounter for therapeutic drug level monitoring: Principal | ICD-10-CM

## 2019-05-01 DIAGNOSIS — R7989 Other specified abnormal findings of blood chemistry: Principal | ICD-10-CM

## 2019-05-01 MED ORDER — TESTOSTERONE 1 % (25 MG/2.5 GRAM) TRANSDERMAL GEL PACKET
PACK | 3 refills | 0 days | Status: CP
Start: 2019-05-01 — End: ?

## 2019-05-01 NOTE — Unmapped (Signed)
Patient testosterone level low normal which may be cause of night sweats. Reviewed with Dr. Rush Barer. Will start on low dose topical androgel 1%. Reched testosterone, free, total in 2 weeks. Will monitor BP and any AE.

## 2019-05-01 NOTE — Unmapped (Signed)
Order placed for repeat testosterone 3/23.

## 2019-05-02 DIAGNOSIS — Z944 Liver transplant status: Principal | ICD-10-CM

## 2019-05-02 DIAGNOSIS — E612 Magnesium deficiency: Secondary | ICD-10-CM | POA: Diagnosis not present

## 2019-05-02 DIAGNOSIS — Z79899 Other long term (current) drug therapy: Secondary | ICD-10-CM | POA: Diagnosis not present

## 2019-05-02 NOTE — Unmapped (Signed)
[facility] Clinical Assessment & Refill Coordination Note    Anthony Alvarado, DOB: 10-06-1960  Phone: [phone] (home)     All above HIPAA information was verified with patient.     Was a Nurse, learning disability used for this call? No    Specialty Medication(s):   Transplant: Envarsus 1mg , Envarsus 0.75mg ,  mycophenolic acid 180mg  and tacrolimus 0.5mg      Current Outpatient Medications   Medication Sig Dispense Refill   ??? acetaminophen (TYLENOL) 325 MG tablet Take 2 tablets (650 mg total) by mouth every six (6) hours as needed for pain or fever (> 38C or 100.89F). (Patient not taking: Reported on 04/17/2019) 100 tablet 0   ??? alendronate (FOSAMAX) 70 MG tablet Take 1 tablet (70 mg total) by mouth every seven (7) days. 4 tablet 11   ??? amLODIPine (NORVASC) 5 MG tablet Take 2 tablets (10 mg total) by mouth daily. 30 tablet 11   ??? aspirin (ECOTRIN) 81 MG tablet Take 1 tablet (81 mg total) by mouth daily. 30 tablet 11   ??? azelastine (ASTELIN) 137 mcg (0.1 %) nasal spray 1 spray by Each Nare route daily as needed.      ??? blood sugar diagnostic Strp Use as directed Three (3) times a day before meals. 90 each 11   ??? calcium carbonate (TUMS) 200 mg calcium (500 mg) chewable tablet Chew 1 tablet Three (3) times a day as needed for heartburn.      ??? cholecalciferol, vitamin D3, 1,000 unit (25 mcg) tablet Take 2 tablets (2,000 Units total) by mouth daily. 60 tablet 11   ??? docusate sodium (COLACE) 100 MG capsule Take 1 capsule (100 mg total) by mouth two (2) times a day as needed for constipation. 60 capsule 0   ??? fluticasone propionate (FLONASE) 50 mcg/actuation nasal spray PLACE ONE OR TWO SPRAYS INTO BOTH NOSTRILS DAILY AS NEEDED.     ??? furosemide (LASIX) 20 MG tablet Take 2 tablets (40 mg total) by mouth daily as needed for swelling. 60 tablet 11   ??? lancets 33 gauge Misc 1 each by Miscellaneous route Three (3) times a day before meals. 100 each 11   ??? levothyroxine (SYNTHROID) 150 MCG tablet Take 1 tablet (150 mcg total) by mouth daily. 30 tablet 11   ??? magnesium oxide-Mg AA chelate (MAGNESIUM, AMINO ACID CHELATE,) 133 mg Tab Take 1 tablet by mouth Two (2) times a day. HOLD until directed to start by your coordinator. (Patient not taking: Reported on 04/17/2019) 60 tablet 11   ??? mycophenolate (MYFORTIC) 180 MG EC tablet Take 2 tablets (360 mg total) by mouth Two (2) times a day. 120 tablet 11   ??? pravastatin (PRAVACHOL) 40 MG tablet Take 1 tablet (40 mg total) by mouth every evening. 90 tablet 3   ??? sertraline (ZOLOFT) 25 MG tablet Take 1 tablet (25 mg total) by mouth daily. 30 tablet 11   ??? tacrolimus (ENVARSUS XR) 0.75 mg Tb24 extended release tablet Take 2 tablets (1.5 mg total) by mouth daily with 2 (1 mg) tablets for a total dose of 3.5 mg daily 60 tablet 3   ??? tacrolimus (ENVARSUS XR) 1 mg Tb24 extended release tablet Take 2 tablets (2 mg) by mouth daily with 2 (0.75 mg) tablets for a total dose of 3.5 mg daily 60 tablet 3   ??? tacrolimus (PROGRAF) 0.5 MG capsule Take 5 capsules (2.5 mg total) by mouth two (2) times a day. 300 capsule 11   ???  testosterone (ANDROGEL) 1 % (25 mg/2.5gram) GlPk Apply 2.5g (1 packet) to each shoulder/upper arm daily for a total daily dose of 5g. 60 packet 3     Current Facility-Administered Medications   Medication Dose Route Frequency Provider Last Rate Last Admin   ??? diazePAM (VALIUM) injection 5 mg  5 mg Intravenous Q10 Min PRN Pierce Crane, MD         Facility-Administered Medications Ordered in Other Visits   Medication Dose Route Frequency Provider Last Rate Last Admin   ??? diazePAM (VALIUM) 5 mg/mL injection                 Changes to medications: see envarsus/tac below    Allergies   Allergen Reactions   ??? Watermelon Flavor      Mouth itching       Changes to allergies: No    SPECIALTY MEDICATION ADHERENCE     envarsus 0.75mg   : 0 days of medicine on hand - will start on this fill  envarsus 1mg   : 0 days of medicine on hand - will start on this fill  Tacrolimus 0.5mg   : 14 days of medicine on hand - will stop once started envarsus  Mycophenolate 180mg   : 7 days of medicine on hand     Medication Adherence    Patient reported X missed doses in the last month: 0  Specialty Medication: envarsus 1mg   Patient is on additional specialty medications: Yes  Additional Specialty Medications: envarsus 0.75mg   Patient Reported Additional Medication X Missed Doses in the Last Month: 0  Patient is on more than two specialty medications: Yes  Specialty Medication: tacrolimus 0.5mg   Patient Reported Additional Medication X Missed Doses in the Last Month: 0  Specialty Medication: mycophenolate 180mg   Patient Reported Additional Medication X Missed Doses in the Last Month: 0          Specialty medication(s) dose(s) confirmed: Patient reports changes to the regimen as follows: pt will finish out current tac supply and then switch to envarsus     Are there any concerns with adherence? No    Adherence counseling provided? Not needed    CLINICAL MANAGEMENT AND INTERVENTION      Clinical Benefit Assessment:    Do you feel the medicine is effective or helping your condition? Yes    Clinical Benefit counseling provided? Not needed    Adverse Effects Assessment:    Are you experiencing any side effects? No    Are you experiencing difficulty administering your medicine? No    Quality of Life Assessment:    How many days over the past month did your transplant  keep you from your normal activities? For example, brushing your teeth or getting up in the morning. 0    Have you discussed this with your provider? Not needed    Therapy Appropriateness:    Is therapy appropriate? Yes, therapy is appropriate and should be continued    DISEASE/MEDICATION-SPECIFIC INFORMATION      N/A    PATIENT SPECIFIC NEEDS     ? Does the patient have any physical, cognitive, or cultural barriers? No    ? Is the patient high risk? Yes, patient is taking a REMS drug. Medication is dispensed in compliance with REMS program. ? Does the patient require a Care Management Plan? No     ? Does the patient require physician intervention or other additional services (i.e. nutrition, smoking cessation, social work)? No      SHIPPING  Specialty Medication(s) to be Shipped:   Transplant: Envarsus 1mg , Envarsus 0.75mg  and  mycophenolic acid 180mg     Other medication(s) to be shipped: fosamax, zoloft, pravachol     Changes to insurance: No    Delivery Scheduled: Yes, Expected medication delivery date: 05/05/2019.     Medication will be delivered via UPS to the confirmed prescription address in [facility].    The patient will receive a drug information handout for each medication shipped and additional FDA Medication Guides as required.  Verified that patient has previously received a Conservation officer, historic buildings.    All of the patient's questions and concerns have been addressed.    [name]   [facility] Specialty Pharmacist

## 2019-05-03 DIAGNOSIS — Z944 Liver transplant status: Principal | ICD-10-CM

## 2019-05-03 DIAGNOSIS — E612 Magnesium deficiency: Principal | ICD-10-CM

## 2019-05-03 DIAGNOSIS — Z5181 Encounter for therapeutic drug level monitoring: Principal | ICD-10-CM

## 2019-05-03 LAB — MAGNESIUM: Magnesium:MCnc:Pt:Ser/Plas:Qn:: 1.8

## 2019-05-03 LAB — CBC W/ DIFFERENTIAL
BASOPHILS ABSOLUTE COUNT: 0.1 10*3/uL (ref 0.0–0.2)
BASOPHILS RELATIVE PERCENT: 1 %
EOSINOPHILS ABSOLUTE COUNT: 0.3 10*3/uL (ref 0.0–0.4)
EOSINOPHILS RELATIVE PERCENT: 3 %
HEMATOCRIT: 43.7 % (ref 37.5–51.0)
IMMATURE GRANULOCYTES: 1 %
LYMPHOCYTES RELATIVE PERCENT: 23 %
MEAN CORPUSCULAR HEMOGLOBIN CONC: 33.4 g/dL (ref 31.5–35.7)
MEAN CORPUSCULAR HEMOGLOBIN: 26.9 pg (ref 26.6–33.0)
MEAN CORPUSCULAR VOLUME: 81 fL (ref 79–97)
MONOCYTES ABSOLUTE COUNT: 0.9 10*3/uL (ref 0.1–0.9)
MONOCYTES RELATIVE PERCENT: 9 %
NEUTROPHILS ABSOLUTE COUNT: 6.2 10*3/uL (ref 1.4–7.0)
NEUTROPHILS RELATIVE PERCENT: 63 %
PLATELET COUNT: 249 10*3/uL (ref 150–450)
RED BLOOD CELL COUNT: 5.42 x10E6/uL (ref 4.14–5.80)
RED CELL DISTRIBUTION WIDTH: 14 % (ref 11.6–15.4)
WHITE BLOOD CELL COUNT: 10 10*3/uL (ref 3.4–10.8)

## 2019-05-03 LAB — COMPREHENSIVE METABOLIC PANEL
A/G RATIO: 1.9 (ref 1.2–2.2)
ALBUMIN: 4.7 g/dL (ref 3.8–4.9)
ALKALINE PHOSPHATASE: 139 IU/L — ABNORMAL HIGH (ref 39–117)
ALT (SGPT): 22 IU/L (ref 0–44)
BILIRUBIN TOTAL: 0.3 mg/dL (ref 0.0–1.2)
BLOOD UREA NITROGEN: 24 mg/dL (ref 6–24)
BUN / CREAT RATIO: 22 — ABNORMAL HIGH (ref 9–20)
CALCIUM: 9.9 mg/dL (ref 8.7–10.2)
CO2: 23 mmol/L (ref 20–29)
GLOBULIN, TOTAL: 2.5 g/dL (ref 1.5–4.5)
GLUCOSE: 103 mg/dL — ABNORMAL HIGH (ref 65–99)
POTASSIUM: 5.2 mmol/L (ref 3.5–5.2)
SODIUM: 140 mmol/L (ref 134–144)
TOTAL PROTEIN: 7.2 g/dL (ref 6.0–8.5)

## 2019-05-03 LAB — GAMMA GLUTAMYL TRANSFERASE: Gamma glutamyl transferase:CCnc:Pt:Ser/Plas:Qn:: 34

## 2019-05-03 LAB — BILIRUBIN DIRECT: Bilirubin.glucuronidated+Bilirubin.albumin bound:MCnc:Pt:Ser/Plas:Qn:: 0.1

## 2019-05-03 LAB — PHOSPHORUS, SERUM: Phosphate:MCnc:Pt:Ser/Plas:Qn:: 3.8

## 2019-05-03 LAB — WHITE BLOOD CELL COUNT: Leukocytes:NCnc:Pt:Bld:Qn:Automated count: 10

## 2019-05-03 LAB — BUN / CREAT RATIO: Urea nitrogen/Creatinine:MRto:Pt:Ser/Plas:Qn:: 22 — ABNORMAL HIGH

## 2019-05-03 NOTE — Unmapped (Signed)
Ordered [facility] labs for next week. Will reorder for following week to f/u on testosterone levels after patient started androgel this week.

## 2019-05-04 LAB — TACROLIMUS BLOOD: Tacrolimus:MCnc:Pt:Bld:Qn:LC/MS/MS: 8.1

## 2019-05-04 MED FILL — PRAVASTATIN 40 MG TABLET: 90 days supply | Qty: 90 | Fill #0 | Status: AC

## 2019-05-04 MED FILL — ENVARSUS XR 1 MG TABLET,EXTENDED RELEASE: 30 days supply | Qty: 60 | Fill #0 | Status: AC

## 2019-05-04 MED FILL — ENVARSUS XR 0.75 MG TABLET,EXTENDED RELEASE: 30 days supply | Qty: 60 | Fill #0 | Status: AC

## 2019-05-04 MED FILL — SERTRALINE 25 MG TABLET: ORAL | 30 days supply | Qty: 30 | Fill #2

## 2019-05-04 MED FILL — SERTRALINE 25 MG TABLET: 30 days supply | Qty: 30 | Fill #2 | Status: AC

## 2019-05-04 MED FILL — ENVARSUS XR 1 MG TABLET,EXTENDED RELEASE: ORAL | 30 days supply | Qty: 60 | Fill #0

## 2019-05-04 MED FILL — ALENDRONATE 70 MG TABLET: 28 days supply | Qty: 4 | Fill #5 | Status: AC

## 2019-05-04 MED FILL — ALENDRONATE 70 MG TABLET: ORAL | 28 days supply | Qty: 4 | Fill #5

## 2019-05-04 NOTE — Unmapped (Signed)
Anthony Alvarado 's Mycophenolate shipment will be sent out  as a result of the medication is too soon to refill until 05/07/19.     I have reached out to the patient and left a voicemail message.  We will reschedule the medication for the delivery date that the patient agreed upon.  We via ups. LVM for pt about delivery change on this medication and also included a note in the delivery comments for patient to see when he receives package on Friday, 05/05/19. [employer] is not allowing [place] to fill this medication until the exact date it is due.

## 2019-05-08 MED FILL — MYCOPHENOLATE SODIUM 180 MG TABLET,DELAYED RELEASE: ORAL | 30 days supply | Qty: 120 | Fill #8

## 2019-05-08 MED FILL — MYCOPHENOLATE SODIUM 180 MG TABLET,DELAYED RELEASE: 30 days supply | Qty: 120 | Fill #8 | Status: AC

## 2019-05-09 DIAGNOSIS — Z944 Liver transplant status: Secondary | ICD-10-CM | POA: Diagnosis not present

## 2019-05-09 DIAGNOSIS — Z5181 Encounter for therapeutic drug level monitoring: Secondary | ICD-10-CM | POA: Diagnosis not present

## 2019-05-09 DIAGNOSIS — E612 Magnesium deficiency: Secondary | ICD-10-CM | POA: Diagnosis not present

## 2019-05-10 LAB — COMPREHENSIVE METABOLIC PANEL
ALBUMIN: 4.6 g/dL (ref 3.8–4.9)
ALKALINE PHOSPHATASE: 133 IU/L — ABNORMAL HIGH (ref 39–117)
ALT (SGPT): 22 IU/L (ref 0–44)
AST (SGOT): 17 IU/L (ref 0–40)
BILIRUBIN TOTAL: 0.3 mg/dL (ref 0.0–1.2)
BLOOD UREA NITROGEN: 18 mg/dL (ref 6–24)
BUN / CREAT RATIO: 18 (ref 9–20)
CALCIUM: 9.4 mg/dL (ref 8.7–10.2)
CHLORIDE: 104 mmol/L (ref 96–106)
CO2: 22 mmol/L (ref 20–29)
CREATININE: 1.02 mg/dL (ref 0.76–1.27)
GFR MDRD AF AMER: 95 mL/min/{1.73_m2}
GLOBULIN, TOTAL: 2.3 g/dL (ref 1.5–4.5)
GLUCOSE: 106 mg/dL — ABNORMAL HIGH (ref 65–99)
POTASSIUM: 4.8 mmol/L (ref 3.5–5.2)
SODIUM: 139 mmol/L (ref 134–144)
TOTAL PROTEIN: 6.9 g/dL (ref 6.0–8.5)

## 2019-05-10 LAB — CBC W/ DIFFERENTIAL
BASOPHILS ABSOLUTE COUNT: 0.1 10*3/uL (ref 0.0–0.2)
BASOPHILS RELATIVE PERCENT: 1 %
EOSINOPHILS ABSOLUTE COUNT: 0.3 10*3/uL (ref 0.0–0.4)
EOSINOPHILS RELATIVE PERCENT: 3 %
HEMATOCRIT: 42.9 % (ref 37.5–51.0)
HEMOGLOBIN: 14.3 g/dL (ref 13.0–17.7)
IMMATURE GRANULOCYTES: 0 %
LYMPHOCYTES ABSOLUTE COUNT: 2.1 10*3/uL (ref 0.7–3.1)
LYMPHOCYTES RELATIVE PERCENT: 23 %
MEAN CORPUSCULAR HEMOGLOBIN CONC: 33.3 g/dL (ref 31.5–35.7)
MEAN CORPUSCULAR HEMOGLOBIN: 27.2 pg (ref 26.6–33.0)
MEAN CORPUSCULAR VOLUME: 82 fL (ref 79–97)
NEUTROPHILS ABSOLUTE COUNT: 5.8 10*3/uL (ref 1.4–7.0)
NEUTROPHILS RELATIVE PERCENT: 64 %
PLATELET COUNT: 239 10*3/uL (ref 150–450)
RED BLOOD CELL COUNT: 5.25 x10E6/uL (ref 4.14–5.80)
RED CELL DISTRIBUTION WIDTH: 14 % (ref 11.6–15.4)
WHITE BLOOD CELL COUNT: 9.2 10*3/uL (ref 3.4–10.8)

## 2019-05-10 LAB — GAMMA GLUTAMYL TRANSFERASE: Gamma glutamyl transferase:CCnc:Pt:Ser/Plas:Qn:: 34

## 2019-05-10 LAB — PHOSPHORUS, SERUM: Phosphate:MCnc:Pt:Ser/Plas:Qn:: 3.1

## 2019-05-10 LAB — MAGNESIUM: Magnesium:MCnc:Pt:Ser/Plas:Qn:: 1.7

## 2019-05-10 LAB — BILIRUBIN DIRECT: Bilirubin.glucuronidated+Bilirubin.albumin bound:MCnc:Pt:Ser/Plas:Qn:: 0.09

## 2019-05-10 LAB — ALKALINE PHOSPHATASE: Alkaline phosphatase:CCnc:Pt:Ser/Plas:Qn:: 133 — ABNORMAL HIGH

## 2019-05-10 LAB — MEAN CORPUSCULAR HEMOGLOBIN: Erythrocyte mean corpuscular hemoglobin:EntMass:Pt:RBC:Qn:Automated count: 27.2

## 2019-05-11 LAB — TACROLIMUS BLOOD: Tacrolimus:MCnc:Pt:Bld:Qn:LC/MS/MS: 6.7

## 2019-05-16 DIAGNOSIS — E612 Magnesium deficiency: Secondary | ICD-10-CM | POA: Diagnosis not present

## 2019-05-16 DIAGNOSIS — Z944 Liver transplant status: Secondary | ICD-10-CM | POA: Diagnosis not present

## 2019-05-16 DIAGNOSIS — Z79899 Other long term (current) drug therapy: Secondary | ICD-10-CM | POA: Diagnosis not present

## 2019-05-16 NOTE — Unmapped (Signed)
POST-TRANSPLANT PSYCHOLOGICAL EVALUATION    Patient Name: Anthony Alvarado  Medical Record Number: [id number]  Date of Service: April 17, 2019  Clinical Psychologist: Robert Bellow, Ph.D.  Intern: none  Evaluation Duration and Procedures:  Clinical interview; record review; case consultation  Procedure Code(s): [phone] (60 min psychotherapy with patient and/or family)    *This patient was not seen in person to minimize potential spread of COVID-19, protect patients/family/providers, and reduce PPE utilization. During this time, transplant psychology will be limiting person-to-person contact when possible.*    I spent 60 minutes on the real-time audio and video with the patient on the date of service. I spent an additional 60 minutes on pre- and post-visit activities on the date of service.     The patient was physically located in [place] or a state in which I am permitted to provide care. The patient and/or parent/guardian understood that s/he may incur co-pays and cost sharing, and agreed to the telemedicine visit. The visit was reasonable and appropriate under the circumstances given the patient's presentation at the time.    The patient and/or parent/guardian has been advised of the potential risks and limitations of this mode of treatment (including, but not limited to, the absence of in-person examination) and has agreed to be treated using telemedicine. The patient's/patient's family's questions regarding telemedicine have been answered.     If the visit was completed in an ambulatory setting, the patient and/or parent/guardian has also been advised to contact their provider???s office for worsening conditions, and seek emergency medical treatment and/or call 911 if the patient deems either necessary.    This evaluation note may contain sensitive and confidential information regarding the patient???s psychosocial adjustment to living with a chronic medical condition. DO NOT share this information outside [facility] without written consent from the patient explicitly stating that mental health records may be released.     The limits of confidentiality and the purpose of the evaluation were reviewed. The patient was provided with a verbal description of the nature and purpose of the psychological evaluation. I also reviewed the referral source, specific referral question for this evaluation, foreseeable risks/discomforts, benefits, limits of confidentiality, and mandatory reporting requirements of this provider. The patient was given the opportunity to ask questions and receive answers about the present evaluation. Oral consent was provided by the patient.     BACKGROUND INFORMATION: [name] was seen for a post-transplant psychological evaluation. He is a 59 y.o. Caucasian male from [place], [place]. He is s/p OLT  09/16/18.       BEHAVIORAL OBSERVATIONS:   [name] arrived for his appointment on time. He was interviewed alone. Rapport was easily established. He did not seem motivated to present  himself in an overly favorable light.      MENTAL STATUS EXAM:  Appearance: Appears stated age  Motor: No abnormal movements  Speech/Language: Normal rate, volume, tone, fluency  Mood: good  Affect: Calm, Cooperative and Euthymic  Thought Process: Logical, linear, clear, coherent, goal directed  Thought Content: Denies SI, HI, self harm, delusions, obsessions, paranoid ideation, or ideas of reference  Perceptual Disturbances: Denies auditory and visual hallucinations, behavior not concerning for response to internal stimuli  Orientation: Oriented to person, place, time, and general circumstances  Attention: Able to fully attend without fluctuations in consciousness  Concentration: Able to fully concentrate and attend  Memory: Immediate, short-term, long-term, and recall grossly intact  Fund of Knowledge: Consistent with level of education and development  Insight: Intact  Judgment: Intact  Impulse Control: Intact INTERVIEW: Pt reported doing fairly well since his transplant. His energy is not fully recovered, and he feels he has about 1 hour of stamina per day at this time. He denied any other significant symptoms, aside from occasional sharp pain due to the lung procedure. Pt got COVID in January, did not require hospitalization, and has recovered well since.     Adherence:  Medication Adherence: Good, pt has missed 2 doses in the 7 months since his surgery. He uses a pill box and phone alarm for 9am/9pm and is now fully independent with his medications.   Medication Concerns: denied problems taking medications, concerns about side effects, affordability, problems obtaining medications, and difficulty remembering medications. - Pt noted that one medication is fairly expensive ($80) so he is talking with his pharmacy to reduce cost.  Other Adherence: Good - good attendance to appointments    Lifestyle Issues:  Physical activity:  Fair, pt is trying to walk more but feels limited in rate of stamina recovery.  Nutrition/Appetite:  Good, improved appetite and occasionally eating when anxious  Sleep: Poor- good sleep initiation, poor maintenance. Pt wakes around 3am and has difficulty reinitiating sleep.   Pain (0=no pain; 10=worst pain imaginable): 2/10  Pain Medications:  tylenol if needed; off gabapentin    Substance Issues:  Nicotine/Tobacco: none  Current alcohol use: none  Illicit drug use: Denied  -If yes type: N/A  Licit substance abuse or misuse: Denied    Social Issues:  Support/Caregiving Issues: none. Pt reported good caregiving support. He had extra help from family.   Social Stressors/Changes: none, aside from pt and daughter getting COVID.    Psychological/Adjustment Issues: Pt denied any new mental health concerns. He remains on sertraline 25mg  (since November 2019) with continued benefit.     Regret/Remorse/Guilt Over Transplant: Denied  Feelings about transplant: Pt acknowledged that transplant was life-changing. He recognized that he had a tough time with complications of his lung collapsing, but is doing well now.     Mood Over Past 30 Days (1:NONE to 10:HIGHEST):   --Depression: 3  --Anxiety: 2  --Irritability: low  --Happiness: 6    Depressive Disorder Symptoms: fatigue/loss of energy    Cognitive Symptoms: Yes - pt endorsed being easily distracted, but reported improvement in thought clarity and memory    Psychotic Symptoms: Denied    Anxiety:  Worry: Yes - health and finances  GAD: Denied  Panic: no  Agoraphobia: Denied  Phobias: Yes - h/o claustrophobia, pt requires valium for MRIs, anxiety in elevators but does not avoid; worse as he's gotten older; otherwise it is not impairing    PTSD: Denied    Other DSM-V Diagnoses: no    Coping Style: Functional. Pt copes by remaining active and doing something. He enjoys working on his car and spending time with a small dog and his friends. His goal is to have more energy.       PSYCHOLOGICAL TESTING:  None    INTERVENTION: health and behavior assessment, supportive counseling, psychoeducation      PSYCHIATRIC DIAGNOSES:   None new.  H/o Adjustment disorder with mixed anxiety & depressed mood prior to transplant; h/o Specific phobia (claustrophobia, dental)  IMPRESSIONS, RECOMMENDATIONS, AND PLAN: [name] was seen for a post-transplant psychological evaluation follow-up. He denied any adherence concerns with medications, appointments, or post-op care. He denied psychological adjustment issues and denied symptoms indicative of a psychiatric disorder.  He has maintained on sertraline 25mg  with benefit. It appears that even with post-op complications and slow recovery of energy/stamina, he has adjusted fairly well post-transplant. We discussed strategies to support mood and coping. He denied regret or remorse over transplant and reported positive feelings about his surgery. He denied concerns over social support and caregiving plan after discharge. There are no substance issues. He agreed to reach out to this Clinical research associate with any changes in mood or coping, given history of adjustment concerns.     Should the patient or treatment team notice a change in functioning, please refer back to transplant psychology for further evaluation and treatment.      Recommendations discussed with patient? yes  Agreed upon by patient? yes

## 2019-05-17 LAB — COMPREHENSIVE METABOLIC PANEL
A/G RATIO: 2.1 (ref 1.2–2.2)
ALBUMIN: 4.7 g/dL (ref 3.8–4.9)
ALT (SGPT): 26 IU/L (ref 0–44)
AST (SGOT): 22 IU/L (ref 0–40)
BILIRUBIN TOTAL: 0.3 mg/dL (ref 0.0–1.2)
BLOOD UREA NITROGEN: 19 mg/dL (ref 6–24)
BUN / CREAT RATIO: 18 (ref 9–20)
CALCIUM: 9.4 mg/dL (ref 8.7–10.2)
CHLORIDE: 103 mmol/L (ref 96–106)
CO2: 23 mmol/L (ref 20–29)
CREATININE: 1.07 mg/dL (ref 0.76–1.27)
GLOBULIN, TOTAL: 2.2 g/dL (ref 1.5–4.5)
GLUCOSE: 113 mg/dL — ABNORMAL HIGH (ref 65–99)
POTASSIUM: 5.2 mmol/L (ref 3.5–5.2)
TOTAL PROTEIN: 6.9 g/dL (ref 6.0–8.5)

## 2019-05-17 LAB — GAMMA GLUTAMYL TRANSFERASE: Gamma glutamyl transferase:CCnc:Pt:Ser/Plas:Qn:: 36

## 2019-05-17 LAB — CBC W/ DIFFERENTIAL
BANDED NEUTROPHILS ABSOLUTE COUNT: 0.1 10*3/uL (ref 0.0–0.1)
BASOPHILS RELATIVE PERCENT: 1 %
EOSINOPHILS ABSOLUTE COUNT: 0.3 10*3/uL (ref 0.0–0.4)
EOSINOPHILS RELATIVE PERCENT: 3 %
HEMATOCRIT: 41.7 % (ref 37.5–51.0)
HEMOGLOBIN: 14.3 g/dL (ref 13.0–17.7)
IMMATURE GRANULOCYTES: 1 %
LYMPHOCYTES ABSOLUTE COUNT: 1.8 10*3/uL (ref 0.7–3.1)
LYMPHOCYTES RELATIVE PERCENT: 19 %
MEAN CORPUSCULAR HEMOGLOBIN CONC: 34.3 g/dL (ref 31.5–35.7)
MEAN CORPUSCULAR HEMOGLOBIN: 27.7 pg (ref 26.6–33.0)
MEAN CORPUSCULAR VOLUME: 81 fL (ref 79–97)
MONOCYTES ABSOLUTE COUNT: 1 10*3/uL — ABNORMAL HIGH (ref 0.1–0.9)
MONOCYTES RELATIVE PERCENT: 10 %
NEUTROPHILS ABSOLUTE COUNT: 6.3 10*3/uL (ref 1.4–7.0)
NEUTROPHILS RELATIVE PERCENT: 66 %
PLATELET COUNT: 222 10*3/uL (ref 150–450)
RED BLOOD CELL COUNT: 5.17 x10E6/uL (ref 4.14–5.80)
RED CELL DISTRIBUTION WIDTH: 13.5 % (ref 11.6–15.4)

## 2019-05-17 LAB — NEUTROPHILS ABSOLUTE COUNT: Neutrophils:NCnc:Pt:Bld:Qn:Automated count: 6.3

## 2019-05-17 LAB — BILIRUBIN DIRECT: Bilirubin.glucuronidated+Bilirubin.albumin bound:MCnc:Pt:Ser/Plas:Qn:: 0.11

## 2019-05-17 LAB — MAGNESIUM: Magnesium:MCnc:Pt:Ser/Plas:Qn:: 1.8

## 2019-05-17 LAB — CHLORIDE: Chloride:SCnc:Pt:Ser/Plas:Qn:: 103

## 2019-05-17 LAB — PHOSPHORUS, SERUM: Phosphate:MCnc:Pt:Ser/Plas:Qn:: 3.5

## 2019-05-18 LAB — TACROLIMUS BLOOD: Tacrolimus:MCnc:Pt:Bld:Qn:LC/MS/MS: 5.3

## 2019-05-19 NOTE — Unmapped (Signed)
error 

## 2019-05-22 DIAGNOSIS — Z5181 Encounter for therapeutic drug level monitoring: Principal | ICD-10-CM

## 2019-05-22 DIAGNOSIS — E612 Magnesium deficiency: Principal | ICD-10-CM

## 2019-05-22 DIAGNOSIS — Z944 Liver transplant status: Principal | ICD-10-CM

## 2019-05-22 NOTE — Unmapped (Signed)
Placed standing orders for [facility].

## 2019-05-23 DIAGNOSIS — Z944 Liver transplant status: Secondary | ICD-10-CM | POA: Diagnosis not present

## 2019-05-23 DIAGNOSIS — Z5181 Encounter for therapeutic drug level monitoring: Secondary | ICD-10-CM | POA: Diagnosis not present

## 2019-05-23 DIAGNOSIS — E612 Magnesium deficiency: Secondary | ICD-10-CM | POA: Diagnosis not present

## 2019-05-23 NOTE — Unmapped (Signed)
Addended by: Genia Harold on: 05/23/2019 10:33 AM     Modules accepted: Orders, SmartSet

## 2019-05-24 LAB — CBC W/ DIFFERENTIAL
BASOPHILS ABSOLUTE COUNT: 0.1 10*3/uL (ref 0.0–0.2)
BASOPHILS RELATIVE PERCENT: 1 %
EOSINOPHILS ABSOLUTE COUNT: 0.3 10*3/uL (ref 0.0–0.4)
EOSINOPHILS RELATIVE PERCENT: 3 %
HEMATOCRIT: 41.6 % (ref 37.5–51.0)
HEMOGLOBIN: 13.8 g/dL (ref 13.0–17.7)
IMMATURE GRANULOCYTES: 1 %
LYMPHOCYTES ABSOLUTE COUNT: 1.8 10*3/uL (ref 0.7–3.1)
LYMPHOCYTES RELATIVE PERCENT: 17 %
MEAN CORPUSCULAR HEMOGLOBIN CONC: 33.2 g/dL (ref 31.5–35.7)
MEAN CORPUSCULAR HEMOGLOBIN: 27.7 pg (ref 26.6–33.0)
MEAN CORPUSCULAR VOLUME: 84 fL (ref 79–97)
MONOCYTES ABSOLUTE COUNT: 1.1 10*3/uL — ABNORMAL HIGH (ref 0.1–0.9)
MONOCYTES RELATIVE PERCENT: 10 %
NEUTROPHILS ABSOLUTE COUNT: 7.4 10*3/uL — ABNORMAL HIGH (ref 1.4–7.0)
NEUTROPHILS RELATIVE PERCENT: 68 %
PLATELET COUNT: 203 10*3/uL (ref 150–450)
RED BLOOD CELL COUNT: 4.98 x10E6/uL (ref 4.14–5.80)
WHITE BLOOD CELL COUNT: 10.7 10*3/uL (ref 3.4–10.8)

## 2019-05-24 LAB — COMPREHENSIVE METABOLIC PANEL
ALBUMIN: 4.6 g/dL (ref 3.8–4.9)
ALKALINE PHOSPHATASE: 134 IU/L — ABNORMAL HIGH (ref 39–117)
AST (SGOT): 21 IU/L (ref 0–40)
BILIRUBIN TOTAL: 0.3 mg/dL (ref 0.0–1.2)
BLOOD UREA NITROGEN: 24 mg/dL (ref 6–24)
BUN / CREAT RATIO: 22 — ABNORMAL HIGH (ref 9–20)
CALCIUM: 9 mg/dL (ref 8.7–10.2)
CHLORIDE: 103 mmol/L (ref 96–106)
CO2: 22 mmol/L (ref 20–29)
CREATININE: 1.08 mg/dL (ref 0.76–1.27)
GFR MDRD AF AMER: 88 mL/min/{1.73_m2}
GFR MDRD NON AF AMER: 76 mL/min/{1.73_m2}
GLOBULIN, TOTAL: 2.2 g/dL (ref 1.5–4.5)
GLUCOSE: 110 mg/dL — ABNORMAL HIGH (ref 65–99)
POTASSIUM: 4.8 mmol/L (ref 3.5–5.2)
SODIUM: 138 mmol/L (ref 134–144)
TOTAL PROTEIN: 6.8 g/dL (ref 6.0–8.5)

## 2019-05-24 LAB — EOSINOPHILS ABSOLUTE COUNT: Eosinophils:NCnc:Pt:Bld:Qn:Automated count: 0.3

## 2019-05-24 LAB — PHOSPHORUS, SERUM: Phosphate:MCnc:Pt:Ser/Plas:Qn:: 2.9

## 2019-05-24 LAB — BILIRUBIN DIRECT: Bilirubin.glucuronidated+Bilirubin.albumin bound:MCnc:Pt:Ser/Plas:Qn:: 0.1

## 2019-05-24 LAB — GAMMA GLUTAMYL TRANSFERASE: Gamma glutamyl transferase:CCnc:Pt:Ser/Plas:Qn:: 36

## 2019-05-24 LAB — MAGNESIUM: Magnesium:MCnc:Pt:Ser/Plas:Qn:: 1.8

## 2019-05-24 LAB — GFR MDRD AF AMER: Glomerular filtration rate/1.73 sq M.predicted.black:ArVRat:Pt:Ser/Plas/Bld:Qn:Creatinine-based formula (CKD-EPI): 88

## 2019-05-25 DIAGNOSIS — Z79899 Other long term (current) drug therapy: Principal | ICD-10-CM

## 2019-05-25 DIAGNOSIS — Z944 Liver transplant status: Principal | ICD-10-CM

## 2019-05-25 LAB — TACROLIMUS BLOOD: Tacrolimus:MCnc:Pt:Bld:Qn:LC/MS/MS: 4.2

## 2019-05-25 MED ORDER — TACROLIMUS 0.5 MG CAPSULE, IMMEDIATE-RELEASE
ORAL_CAPSULE | 11 refills | 0 days
Start: 2019-05-25 — End: ?

## 2019-05-25 NOTE — Unmapped (Signed)
Patients latest two tac levels subpar.Reviewed with Np Martin-Velez who recommended he increase his dose to 3mg /2.5mg  daily. Spoke with patient and relayed instructions. He verbalized understanding and will continue with weekly lab routine and is aware he should contact coordinator when regular tac dose is depleted and he begins envarsus.

## 2019-05-25 NOTE — Unmapped (Signed)
Patient's latest two tac levels were subpar. In speaking with him this morning, he said he had not started the envarsus yet, but agreed to call coordinator when he starts. He believed he was taking 2.5mg /2mg  tac daily, but should be taking 2.5mg  bid. He was not in a place where he could check but would return call to coordinator to confirm dose.

## 2019-05-29 DIAGNOSIS — Z5181 Encounter for therapeutic drug level monitoring: Principal | ICD-10-CM

## 2019-05-29 DIAGNOSIS — E612 Magnesium deficiency: Principal | ICD-10-CM

## 2019-05-29 DIAGNOSIS — Z944 Liver transplant status: Principal | ICD-10-CM

## 2019-05-29 NOTE — Unmapped (Signed)
[facility] Refill Coordination Note    Specialty Medication(s) to be Shipped:   Transplant: mycophenolate mofetil 180mg   **Denied Envarsus 0.75mg  and Envarsus 1mg ; wants a call back in 2 weeks**    Other medication(s) to be shipped: alendronate 70mg  and sertraline 25mg      Anthony Alvarado, DOB: 01/27/1961  Phone: [phone] (home)       All above HIPAA information was verified with patient.     Was a Nurse, learning disability used for this call? No    Completed refill call assessment today to schedule patient's medication shipment from the [facility] [phone]).       Specialty medication(s) and dose(s) confirmed: Regimen is correct and unchanged.   Changes to medications: Anthony Alvarado reports no changes at this time.  Changes to insurance: No  Questions for the pharmacist: No    Confirmed patient received Welcome Packet with first shipment. The patient will receive a drug information handout for each medication shipped and additional FDA Medication Guides as required.       DISEASE/MEDICATION-SPECIFIC INFORMATION        N/A    SPECIALTY MEDICATION ADHERENCE     Medication Adherence    Patient reported X missed doses in the last month: 0  Specialty Medication: Mycophenolate 180mg   Patient is on additional specialty medications: No          Mycophenolate 180 mg: 9 days of medicine on hand     SHIPPING     Shipping address confirmed in Epic.     Delivery Scheduled: Yes, Expected medication delivery date: 06/02/2019.     Medication will be delivered via UPS to the prescription address in Epic [facility].    [facility] Specialty Pharmacist

## 2019-05-30 DIAGNOSIS — E612 Magnesium deficiency: Secondary | ICD-10-CM | POA: Diagnosis not present

## 2019-05-30 DIAGNOSIS — Z944 Liver transplant status: Secondary | ICD-10-CM | POA: Diagnosis not present

## 2019-05-30 DIAGNOSIS — Z5181 Encounter for therapeutic drug level monitoring: Secondary | ICD-10-CM | POA: Diagnosis not present

## 2019-05-31 LAB — CBC W/ DIFFERENTIAL
BASOPHILS ABSOLUTE COUNT: 0.1 10*3/uL (ref 0.0–0.2)
BASOPHILS RELATIVE PERCENT: 1 %
EOSINOPHILS ABSOLUTE COUNT: 0.3 10*3/uL (ref 0.0–0.4)
EOSINOPHILS RELATIVE PERCENT: 3 %
HEMATOCRIT: 41.9 % (ref 37.5–51.0)
HEMOGLOBIN: 14.1 g/dL (ref 13.0–17.7)
IMMATURE GRANULOCYTES: 1 %
LYMPHOCYTES ABSOLUTE COUNT: 2.2 10*3/uL (ref 0.7–3.1)
LYMPHOCYTES RELATIVE PERCENT: 21 %
MEAN CORPUSCULAR HEMOGLOBIN CONC: 33.7 g/dL (ref 31.5–35.7)
MEAN CORPUSCULAR VOLUME: 82 fL (ref 79–97)
MONOCYTES ABSOLUTE COUNT: 1 10*3/uL — ABNORMAL HIGH (ref 0.1–0.9)
MONOCYTES RELATIVE PERCENT: 10 %
NEUTROPHILS ABSOLUTE COUNT: 6.5 10*3/uL (ref 1.4–7.0)
NEUTROPHILS RELATIVE PERCENT: 64 %
PLATELET COUNT: 218 10*3/uL (ref 150–450)
RED BLOOD CELL COUNT: 5.13 x10E6/uL (ref 4.14–5.80)
RED CELL DISTRIBUTION WIDTH: 13.8 % (ref 11.6–15.4)
WHITE BLOOD CELL COUNT: 10.2 10*3/uL (ref 3.4–10.8)

## 2019-05-31 LAB — COMPREHENSIVE METABOLIC PANEL
ALBUMIN: 4.8 g/dL (ref 3.8–4.9)
ALKALINE PHOSPHATASE: 131 IU/L — ABNORMAL HIGH (ref 39–117)
ALT (SGPT): 25 IU/L (ref 0–44)
AST (SGOT): 18 IU/L (ref 0–40)
BILIRUBIN TOTAL: 0.3 mg/dL (ref 0.0–1.2)
BLOOD UREA NITROGEN: 21 mg/dL (ref 6–24)
BUN / CREAT RATIO: 21 — ABNORMAL HIGH (ref 9–20)
CALCIUM: 9.3 mg/dL (ref 8.7–10.2)
CHLORIDE: 106 mmol/L (ref 96–106)
CO2: 21 mmol/L (ref 20–29)
CREATININE: 1.02 mg/dL (ref 0.76–1.27)
GFR MDRD AF AMER: 95 mL/min/{1.73_m2}
GFR MDRD NON AF AMER: 82 mL/min/{1.73_m2}
GLOBULIN, TOTAL: 2.3 g/dL (ref 1.5–4.5)
GLUCOSE: 111 mg/dL — ABNORMAL HIGH (ref 65–99)
POTASSIUM: 4.9 mmol/L (ref 3.5–5.2)
SODIUM: 143 mmol/L (ref 134–144)

## 2019-05-31 LAB — TACROLIMUS BLOOD: Tacrolimus:MCnc:Pt:Bld:Qn:LC/MS/MS: 7

## 2019-05-31 LAB — PHOSPHORUS, SERUM: Phosphate:MCnc:Pt:Ser/Plas:Qn:: 3.1

## 2019-05-31 LAB — GAMMA GLUTAMYL TRANSFERASE: Gamma glutamyl transferase:CCnc:Pt:Ser/Plas:Qn:: 35

## 2019-05-31 LAB — BILIRUBIN DIRECT: Bilirubin.glucuronidated+Bilirubin.albumin bound:MCnc:Pt:Ser/Plas:Qn:: 0.12

## 2019-05-31 LAB — MAGNESIUM: Magnesium:MCnc:Pt:Ser/Plas:Qn:: 1.7

## 2019-05-31 LAB — EOSINOPHILS ABSOLUTE COUNT: Eosinophils:NCnc:Pt:Bld:Qn:Automated count: 0.3

## 2019-05-31 LAB — AST (SGOT): Aspartate aminotransferase:CCnc:Pt:Ser/Plas:Qn:: 18

## 2019-06-01 MED FILL — SERTRALINE 25 MG TABLET: ORAL | 30 days supply | Qty: 30 | Fill #3

## 2019-06-01 MED FILL — ALENDRONATE 70 MG TABLET: 28 days supply | Qty: 4 | Fill #6 | Status: AC

## 2019-06-01 MED FILL — SERTRALINE 25 MG TABLET: 30 days supply | Qty: 30 | Fill #3 | Status: AC

## 2019-06-01 MED FILL — ALENDRONATE 70 MG TABLET: ORAL | 28 days supply | Qty: 4 | Fill #6

## 2019-06-01 NOTE — Unmapped (Signed)
Anthony Alvarado 's mycophenolate shipment will be delayed as a result of the medication is too soon to refill until 06/06/19.     I have reached out to the patient and communicated the delivery change. We will reschedule the medication for the delivery date that the patient agreed upon.  We have confirmed the delivery date as 06/07/19, via ups.

## 2019-06-05 ENCOUNTER — Other Ambulatory Visit: Payer: Self-pay

## 2019-06-05 ENCOUNTER — Ambulatory Visit (HOSPITAL_COMMUNITY): Payer: BC Managed Care – PPO | Attending: Internal Medicine

## 2019-06-05 DIAGNOSIS — E612 Magnesium deficiency: Principal | ICD-10-CM

## 2019-06-05 DIAGNOSIS — Z5181 Encounter for therapeutic drug level monitoring: Principal | ICD-10-CM

## 2019-06-05 DIAGNOSIS — Z944 Liver transplant status: Principal | ICD-10-CM

## 2019-06-05 DIAGNOSIS — I350 Nonrheumatic aortic (valve) stenosis: Secondary | ICD-10-CM | POA: Insufficient documentation

## 2019-06-06 DIAGNOSIS — Z944 Liver transplant status: Secondary | ICD-10-CM | POA: Diagnosis not present

## 2019-06-06 DIAGNOSIS — E612 Magnesium deficiency: Secondary | ICD-10-CM | POA: Diagnosis not present

## 2019-06-06 DIAGNOSIS — Z5181 Encounter for therapeutic drug level monitoring: Secondary | ICD-10-CM | POA: Diagnosis not present

## 2019-06-06 MED FILL — MYCOPHENOLATE SODIUM 180 MG TABLET,DELAYED RELEASE: 30 days supply | Qty: 120 | Fill #9 | Status: AC

## 2019-06-06 MED FILL — MYCOPHENOLATE SODIUM 180 MG TABLET,DELAYED RELEASE: ORAL | 30 days supply | Qty: 120 | Fill #9

## 2019-06-07 LAB — MAGNESIUM: Magnesium:MCnc:Pt:Ser/Plas:Qn:: 1.8

## 2019-06-07 LAB — COMPREHENSIVE METABOLIC PANEL
A/G RATIO: 1.8 (ref 1.2–2.2)
ALBUMIN: 4.8 g/dL (ref 3.8–4.9)
ALKALINE PHOSPHATASE: 134 IU/L — ABNORMAL HIGH (ref 39–117)
ALT (SGPT): 26 IU/L (ref 0–44)
AST (SGOT): 19 IU/L (ref 0–40)
BILIRUBIN TOTAL: 0.4 mg/dL (ref 0.0–1.2)
BLOOD UREA NITROGEN: 20 mg/dL (ref 6–24)
BUN / CREAT RATIO: 17 (ref 9–20)
CHLORIDE: 105 mmol/L (ref 96–106)
CO2: 25 mmol/L (ref 20–29)
CREATININE: 1.16 mg/dL (ref 0.76–1.27)
GFR MDRD AF AMER: 80 mL/min/{1.73_m2}
GFR MDRD NON AF AMER: 70 mL/min/{1.73_m2}
GLOBULIN, TOTAL: 2.6 g/dL (ref 1.5–4.5)
GLUCOSE: 120 mg/dL — ABNORMAL HIGH (ref 65–99)
POTASSIUM: 5.3 mmol/L — ABNORMAL HIGH (ref 3.5–5.2)
SODIUM: 141 mmol/L (ref 134–144)
TOTAL PROTEIN: 7.4 g/dL (ref 6.0–8.5)

## 2019-06-07 LAB — BLOOD UREA NITROGEN: Urea nitrogen:MCnc:Pt:Ser/Plas:Qn:: 20

## 2019-06-07 LAB — CBC W/ DIFFERENTIAL
BANDED NEUTROPHILS ABSOLUTE COUNT: 0.1 10*3/uL (ref 0.0–0.1)
BASOPHILS ABSOLUTE COUNT: 0.1 10*3/uL (ref 0.0–0.2)
BASOPHILS RELATIVE PERCENT: 1 %
EOSINOPHILS ABSOLUTE COUNT: 0.3 10*3/uL (ref 0.0–0.4)
EOSINOPHILS RELATIVE PERCENT: 3 %
HEMATOCRIT: 45.2 % (ref 37.5–51.0)
HEMOGLOBIN: 15.2 g/dL (ref 13.0–17.7)
IMMATURE GRANULOCYTES: 1 %
LYMPHOCYTES ABSOLUTE COUNT: 1.9 10*3/uL (ref 0.7–3.1)
LYMPHOCYTES RELATIVE PERCENT: 21 %
MEAN CORPUSCULAR HEMOGLOBIN CONC: 33.6 g/dL (ref 31.5–35.7)
MEAN CORPUSCULAR HEMOGLOBIN: 28.4 pg (ref 26.6–33.0)
MEAN CORPUSCULAR VOLUME: 84 fL (ref 79–97)
MONOCYTES ABSOLUTE COUNT: 0.9 10*3/uL (ref 0.1–0.9)
NEUTROPHILS RELATIVE PERCENT: 64 %
PLATELET COUNT: 234 10*3/uL (ref 150–450)
RED BLOOD CELL COUNT: 5.36 x10E6/uL (ref 4.14–5.80)
RED CELL DISTRIBUTION WIDTH: 14.1 % (ref 11.6–15.4)
WHITE BLOOD CELL COUNT: 9.3 10*3/uL (ref 3.4–10.8)

## 2019-06-07 LAB — PHOSPHORUS, SERUM: Phosphate:MCnc:Pt:Ser/Plas:Qn:: 3.2

## 2019-06-07 LAB — BILIRUBIN DIRECT: Bilirubin.glucuronidated+Bilirubin.albumin bound:MCnc:Pt:Ser/Plas:Qn:: 0.13

## 2019-06-07 LAB — GAMMA GLUTAMYL TRANSFERASE: Gamma glutamyl transferase:CCnc:Pt:Ser/Plas:Qn:: 32

## 2019-06-07 LAB — LYMPHOCYTES RELATIVE PERCENT: Lymphocytes/100 leukocytes:NFr:Pt:Bld:Qn:Automated count: 21

## 2019-06-08 LAB — TACROLIMUS BLOOD: Tacrolimus:MCnc:Pt:Bld:Qn:LC/MS/MS: 6.7

## 2019-06-12 DIAGNOSIS — E612 Magnesium deficiency: Principal | ICD-10-CM

## 2019-06-12 DIAGNOSIS — Z944 Liver transplant status: Principal | ICD-10-CM

## 2019-06-12 DIAGNOSIS — Z5181 Encounter for therapeutic drug level monitoring: Principal | ICD-10-CM

## 2019-06-12 NOTE — Unmapped (Signed)
Patient denied Envarsus medication refills. Patient stated that they have over 3 weeks worth of medication on hand. Moving specialty refill call to appropriate date.

## 2019-06-13 DIAGNOSIS — Z944 Liver transplant status: Secondary | ICD-10-CM | POA: Diagnosis not present

## 2019-06-13 DIAGNOSIS — R7989 Other specified abnormal findings of blood chemistry: Secondary | ICD-10-CM | POA: Diagnosis not present

## 2019-06-13 DIAGNOSIS — E612 Magnesium deficiency: Secondary | ICD-10-CM | POA: Diagnosis not present

## 2019-06-13 DIAGNOSIS — Z5181 Encounter for therapeutic drug level monitoring: Secondary | ICD-10-CM | POA: Diagnosis not present

## 2019-06-14 LAB — LYMPHOCYTES ABSOLUTE COUNT: Lymphocytes:NCnc:Pt:Bld:Qn:Automated count: 2

## 2019-06-14 LAB — CBC W/ DIFFERENTIAL
BASOPHILS ABSOLUTE COUNT: 0.1 10*3/uL (ref 0.0–0.2)
BASOPHILS RELATIVE PERCENT: 1 %
EOSINOPHILS ABSOLUTE COUNT: 0.3 10*3/uL (ref 0.0–0.4)
EOSINOPHILS RELATIVE PERCENT: 2 %
HEMATOCRIT: 42.7 % (ref 37.5–51.0)
HEMOGLOBIN: 14.5 g/dL (ref 13.0–17.7)
IMMATURE GRANULOCYTES: 1 %
LYMPHOCYTES ABSOLUTE COUNT: 2 10*3/uL (ref 0.7–3.1)
LYMPHOCYTES RELATIVE PERCENT: 18 %
MEAN CORPUSCULAR HEMOGLOBIN CONC: 34 g/dL (ref 31.5–35.7)
MEAN CORPUSCULAR HEMOGLOBIN: 27.9 pg (ref 26.6–33.0)
MEAN CORPUSCULAR VOLUME: 82 fL (ref 79–97)
MONOCYTES ABSOLUTE COUNT: 1.1 10*3/uL — ABNORMAL HIGH (ref 0.1–0.9)
MONOCYTES RELATIVE PERCENT: 9 %
NEUTROPHILS ABSOLUTE COUNT: 7.7 10*3/uL — ABNORMAL HIGH (ref 1.4–7.0)
NEUTROPHILS RELATIVE PERCENT: 69 %
PLATELET COUNT: 231 10*3/uL (ref 150–450)
RED CELL DISTRIBUTION WIDTH: 13.9 % (ref 11.6–15.4)
WHITE BLOOD CELL COUNT: 11.2 10*3/uL — ABNORMAL HIGH (ref 3.4–10.8)

## 2019-06-14 LAB — GAMMA GLUTAMYL TRANSFERASE: Gamma glutamyl transferase:CCnc:Pt:Ser/Plas:Qn:: 37

## 2019-06-14 LAB — COMPREHENSIVE METABOLIC PANEL
A/G RATIO: 2.5 — ABNORMAL HIGH (ref 1.2–2.2)
ALBUMIN: 4.9 g/dL (ref 3.8–4.9)
ALT (SGPT): 29 IU/L (ref 0–44)
BILIRUBIN TOTAL: 0.4 mg/dL (ref 0.0–1.2)
BLOOD UREA NITROGEN: 28 mg/dL — ABNORMAL HIGH (ref 6–24)
BUN / CREAT RATIO: 26 — ABNORMAL HIGH (ref 9–20)
CALCIUM: 9.5 mg/dL (ref 8.7–10.2)
CHLORIDE: 101 mmol/L (ref 96–106)
CO2: 20 mmol/L (ref 20–29)
CREATININE: 1.07 mg/dL (ref 0.76–1.27)
GFR MDRD NON AF AMER: 77 mL/min/{1.73_m2}
GLOBULIN, TOTAL: 2 g/dL (ref 1.5–4.5)
GLUCOSE: 122 mg/dL — ABNORMAL HIGH (ref 65–99)
POTASSIUM: 5 mmol/L (ref 3.5–5.2)
SODIUM: 137 mmol/L (ref 134–144)
TOTAL PROTEIN: 6.9 g/dL (ref 6.0–8.5)

## 2019-06-14 LAB — CHLORIDE: Chloride:SCnc:Pt:Ser/Plas:Qn:: 101

## 2019-06-14 LAB — MAGNESIUM: Magnesium:MCnc:Pt:Ser/Plas:Qn:: 1.8

## 2019-06-14 LAB — BILIRUBIN DIRECT: Bilirubin.glucuronidated+Bilirubin.albumin bound:MCnc:Pt:Ser/Plas:Qn:: 0.13

## 2019-06-14 LAB — PHOSPHORUS, SERUM: Phosphate:MCnc:Pt:Ser/Plas:Qn:: 3.6

## 2019-06-15 LAB — TACROLIMUS BLOOD: Tacrolimus:MCnc:Pt:Bld:Qn:LC/MS/MS: 8

## 2019-06-16 DIAGNOSIS — E612 Magnesium deficiency: Principal | ICD-10-CM

## 2019-06-16 DIAGNOSIS — C220 Liver cell carcinoma: Principal | ICD-10-CM

## 2019-06-16 DIAGNOSIS — B259 Cytomegaloviral disease, unspecified: Principal | ICD-10-CM

## 2019-06-16 DIAGNOSIS — Z944 Liver transplant status: Principal | ICD-10-CM

## 2019-06-16 DIAGNOSIS — Z5181 Encounter for therapeutic drug level monitoring: Principal | ICD-10-CM

## 2019-06-18 LAB — TESTOSTERONE,TOTAL,LC/MS/MS: Testosterone:MCnc:Pt:Ser/Plas:Qn:: 214.2 — ABNORMAL LOW

## 2019-06-19 ENCOUNTER — Encounter: Admit: 2019-06-19 | Discharge: 2019-06-20 | Payer: MEDICARE

## 2019-06-19 ENCOUNTER — Encounter: Admit: 2019-06-19 | Discharge: 2019-06-20 | Payer: MEDICARE | Attending: Nutritionist | Primary: Nutritionist

## 2019-06-19 ENCOUNTER — Encounter: Admit: 2019-06-19 | Discharge: 2019-06-20 | Payer: MEDICARE | Attending: Health Service | Primary: Health Service

## 2019-06-19 DIAGNOSIS — Z944 Liver transplant status: Principal | ICD-10-CM

## 2019-06-19 DIAGNOSIS — E612 Magnesium deficiency: Principal | ICD-10-CM

## 2019-06-19 DIAGNOSIS — M549 Dorsalgia, unspecified: Principal | ICD-10-CM

## 2019-06-19 DIAGNOSIS — R300 Dysuria: Principal | ICD-10-CM

## 2019-06-19 DIAGNOSIS — Z79899 Other long term (current) drug therapy: Principal | ICD-10-CM

## 2019-06-19 DIAGNOSIS — Z5181 Encounter for therapeutic drug level monitoring: Principal | ICD-10-CM

## 2019-06-19 DIAGNOSIS — E039 Hypothyroidism, unspecified: Principal | ICD-10-CM

## 2019-06-19 DIAGNOSIS — R7989 Other specified abnormal findings of blood chemistry: Principal | ICD-10-CM

## 2019-06-19 DIAGNOSIS — I10 Essential (primary) hypertension: Principal | ICD-10-CM

## 2019-06-19 DIAGNOSIS — Z7983 Long term (current) use of bisphosphonates: Secondary | ICD-10-CM | POA: Diagnosis not present

## 2019-06-19 DIAGNOSIS — Z7982 Long term (current) use of aspirin: Secondary | ICD-10-CM | POA: Diagnosis not present

## 2019-06-19 DIAGNOSIS — C220 Liver cell carcinoma: Secondary | ICD-10-CM | POA: Diagnosis not present

## 2019-06-19 DIAGNOSIS — B259 Cytomegaloviral disease, unspecified: Secondary | ICD-10-CM | POA: Diagnosis not present

## 2019-06-19 DIAGNOSIS — G4733 Obstructive sleep apnea (adult) (pediatric): Secondary | ICD-10-CM | POA: Diagnosis not present

## 2019-06-19 DIAGNOSIS — K7469 Other cirrhosis of liver: Secondary | ICD-10-CM | POA: Diagnosis not present

## 2019-06-19 DIAGNOSIS — E119 Type 2 diabetes mellitus without complications: Secondary | ICD-10-CM | POA: Diagnosis not present

## 2019-06-19 DIAGNOSIS — Z8505 Personal history of malignant neoplasm of liver: Secondary | ICD-10-CM | POA: Diagnosis not present

## 2019-06-19 LAB — CBC W/ AUTO DIFF
BASOPHILS ABSOLUTE COUNT: 0.1 10*9/L (ref 0.0–0.1)
BASOPHILS RELATIVE PERCENT: 0.6 %
EOSINOPHILS ABSOLUTE COUNT: 0.3 10*9/L (ref 0.0–0.4)
EOSINOPHILS RELATIVE PERCENT: 2.9 %
HEMOGLOBIN: 14.2 g/dL (ref 13.5–17.5)
LARGE UNSTAINED CELLS: 3 % (ref 0–4)
LYMPHOCYTES ABSOLUTE COUNT: 1.7 10*9/L (ref 1.5–5.0)
LYMPHOCYTES RELATIVE PERCENT: 18.4 %
MEAN CORPUSCULAR HEMOGLOBIN: 28.6 pg (ref 26.0–34.0)
MEAN CORPUSCULAR VOLUME: 86.8 fL (ref 80.0–100.0)
MEAN PLATELET VOLUME: 8 fL (ref 7.0–10.0)
MONOCYTES ABSOLUTE COUNT: 0.7 10*9/L (ref 0.2–0.8)
MONOCYTES RELATIVE PERCENT: 7.7 %
NEUTROPHILS ABSOLUTE COUNT: 6.2 10*9/L (ref 2.0–7.5)
NEUTROPHILS RELATIVE PERCENT: 67.8 %
RED BLOOD CELL COUNT: 4.98 10*12/L (ref 4.50–5.90)
RED CELL DISTRIBUTION WIDTH: 14.8 % (ref 12.0–15.0)

## 2019-06-19 LAB — COMPREHENSIVE METABOLIC PANEL
ALBUMIN: 4.8 g/dL (ref 3.5–5.0)
ALKALINE PHOSPHATASE: 97 U/L (ref 38–126)
ALT (SGPT): 31 U/L (ref <50)
ANION GAP: 11 mmol/L (ref 7–15)
AST (SGOT): 24 U/L (ref 19–55)
BILIRUBIN TOTAL: 0.6 mg/dL (ref 0.0–1.2)
BUN / CREAT RATIO: 22
CALCIUM: 9.2 mg/dL (ref 8.5–10.2)
CHLORIDE: 104 mmol/L (ref 98–107)
CO2: 26 mmol/L (ref 22.0–30.0)
CREATININE: 1.01 mg/dL (ref 0.70–1.30)
EGFR CKD-EPI NON-AA MALE: 82 mL/min/{1.73_m2} (ref >=60)
GLUCOSE RANDOM: 123 mg/dL — ABNORMAL HIGH (ref 70–99)
POTASSIUM: 5.4 mmol/L — ABNORMAL HIGH (ref 3.5–5.0)
PROTEIN TOTAL: 7.4 g/dL (ref 6.5–8.3)

## 2019-06-19 LAB — URINALYSIS WITH CULTURE REFLEX
BACTERIA: NONE SEEN /HPF
BILIRUBIN UA: NEGATIVE
BLOOD UA: NEGATIVE
GLUCOSE UA: NEGATIVE
KETONES UA: NEGATIVE
LEUKOCYTE ESTERASE UA: NEGATIVE
NITRITE UA: NEGATIVE
PROTEIN UA: NEGATIVE
RBC UA: <1 /HPF (ref <=3)
SPECIFIC GRAVITY UA: 1.026 (ref 1.003–1.030)
SQUAMOUS EPITHELIAL: <1 /HPF (ref 0–5)
UROBILINOGEN UA: 0.2
WBC UA: <1 /HPF (ref <=2)

## 2019-06-19 LAB — PHOSPHORUS: Phosphate:MCnc:Pt:Ser/Plas:Qn:: 3.6

## 2019-06-19 LAB — ALKALINE PHOSPHATASE: Alkaline phosphatase:CCnc:Pt:Ser/Plas:Qn:: 97

## 2019-06-19 LAB — NITRITE UA: Nitrite:PrThr:Pt:Urine:Ord:Test strip: NEGATIVE

## 2019-06-19 LAB — AFP-TUMOR MARKER: Alpha-1-Fetoprotein.tumor marker:MCnc:Pt:Ser/Plas:Qn:: 1

## 2019-06-19 LAB — THYROID STIMULATING HORMONE: Thyrotropin:ACnc:Pt:Ser/Plas:Qn:: 1.44

## 2019-06-19 LAB — TACROLIMUS, TROUGH: Lab: 9.7

## 2019-06-19 LAB — FREE T4: Thyroxine.free:MCnc:Pt:Ser/Plas:Qn:: 1.29

## 2019-06-19 LAB — BILIRUBIN DIRECT: Bilirubin.glucuronidated+Bilirubin.albumin bound:MCnc:Pt:Ser/Plas:Qn:: <0.1

## 2019-06-19 LAB — GAMMA GLUTAMYL TRANSFERASE: Gamma glutamyl transferase:CCnc:Pt:Ser/Plas:Qn:: 38

## 2019-06-19 LAB — MAGNESIUM: Magnesium:MCnc:Pt:Ser/Plas:Qn:: 1.5 — ABNORMAL LOW

## 2019-06-19 LAB — EOSINOPHILS RELATIVE PERCENT: Eosinophils/100 leukocytes:NFr:Pt:Bld:Qn:Automated count: 2.9

## 2019-06-19 MED ORDER — TESTOSTERONE 1 % (25 MG/2.5 GRAM) TRANSDERMAL GEL PACKET
PACK | 3 refills | 0 days | Status: CP
Start: 2019-06-19 — End: ?

## 2019-06-19 MED ORDER — TACROLIMUS XR 0.75 MG TABLET,EXTENDED RELEASE 24 HR
ORAL_TABLET | 3 refills | 0 days
Start: 2019-06-19 — End: ?

## 2019-06-19 MED ORDER — METFORMIN ER 500 MG TABLET,EXTENDED RELEASE 24 HR
ORAL_TABLET | Freq: Two times a day (BID) | ORAL | 11 refills | 30 days | Status: CP
Start: 2019-06-19 — End: 2020-06-18
  Filled 2019-06-21: qty 60, 30d supply, fill #0

## 2019-06-19 MED ORDER — TACROLIMUS XR 1 MG TABLET,EXTENDED RELEASE 24 HR
ORAL_TABLET | Freq: Every day | ORAL | 11 refills | 30.00000 days | Status: CP
Start: 2019-06-19 — End: ?
  Filled 2019-06-28: qty 90, 30d supply, fill #0

## 2019-06-19 MED ORDER — MYCOPHENOLATE SODIUM 180 MG TABLET,DELAYED RELEASE
ORAL_TABLET | Freq: Two times a day (BID) | ORAL | 11 refills | 30 days | Status: CP
Start: 2019-06-19 — End: 2020-06-18
  Filled 2019-07-06: qty 60, 30d supply, fill #0

## 2019-06-19 NOTE — Unmapped (Signed)
Patient seen in clinic today for his 71mo post liver txp follow up, accompanied by his exwife. He reports he is feeling well overall. He has c/o recent onset (last 4 weeks) of bilateral lumbar back pain at night. He denies any repetitive physical activities that might have caused strain, and denies any dysuria or blood in his urine or stool. He states he has frequent loose stools depending on his diet, but denies any cramping or watery texture. He has gained about 30# post txp and says his stomach does not feel good sometimes, but was unable to articulate what that meant, denying any overt N/abd.pain, gas, or hernias. He has no c/o abd or LE swelling., Since having had covid, he has had no c/o cough SOB, fever or chills. Approved of him receiving the covid vaccine at this point, since it has been 3 mos since infection when he received BAM. Instructed him to update coordinator once received. He also reports he is having sensation of crawling in his left ear.  NP Martin-Velez examined patients ears and finding substantial cerumen, recommended he f/u with his pcp for irrigation and may consider starting Claritin in the event it is r/t allergies.Patient seen by txp pharmD today, who recommended reducing his Myfortic to 180mg  bid and decreasing his Evarsus, which he started on 4/17, since trough today was 9.7. Discussed the importance of wt management for ongoing liver health and NP recommended he start Metformin for rising A1C. He is not working yet, and still feels he is too weak to comply with the demands of a ft job. He saw his cardiologist and reports he was told he may need some intervention for a mod-severe heart valve defect., and will f/u with them in 6 mos. Patient denies use of tobacco, etoh, or illicit drugs.Encouraged him to increase hydration with non-caffeinated low sugar beverages. He verbalized understanding of all discussed.

## 2019-06-19 NOTE — Unmapped (Signed)
[facility] TRANSPLANT CLINIC PHARMACY NOTE  06/19/2019   Anthony Alvarado  [id number]     Medication changes today:   1. Decrease Envarsus to 3 mg daily  2. Decrease Myfortic to 180 mg BID  3. Start metformin XR 500 mg daily x7d then increase to 500 mg BID  4. Increase testosterone to 10g daily    Education/Adherence tools provided today:  1.provided updated medication list  2. provided additional pill box education  3.  provided additional education on immunosuppression and transplant related medications including reviewing indications of medications, dosing and side effects    Follow up items:  1. goal of understanding indications and dosing of immunosuppression medications    Next visit with pharmacy in 1-3 months   ____________________________________________________________________    Anthony Alvarado is a 59 y.o. male s/p orthotopic liver transplant on 09/16/2018 (Liver) 2/2 cryptogenic cirrhosis w/HCC.    Other PMH significant for HTN, T2DM, OSA, aortic stenosis    Seen by pharmacy today for: medication management and pill box fill and adherence education; last seen by pharmacy 2 months ago     CC:  Patient has no complaints today     There were no vitals filed for this visit.    Allergies   Allergen Reactions   ??? Watermelon Flavor      Mouth itching     Medications reviewed in EPIC medication station and updated today by the clinical pharmacist practitioner.    Outpatient Encounter Medications as of 06/19/2019   Medication Sig Dispense Refill   ??? acetaminophen (TYLENOL) 325 MG tablet Take 2 tablets (650 mg total) by mouth every six (6) hours as needed for pain or fever (> 38C or 100.5F). (Patient not taking: Reported on 04/17/2019) 100 tablet 0   ??? alendronate (FOSAMAX) 70 MG tablet Take 1 tablet (70 mg total) by mouth every seven (7) days. 4 tablet 11   ??? amLODIPine (NORVASC) 5 MG tablet Take 2 tablets (10 mg total) by mouth daily. 30 tablet 11   ??? aspirin (ECOTRIN) 81 MG tablet Take 1 tablet (81 mg total) by mouth daily. 30 tablet 11   ??? azelastine (ASTELIN) 137 mcg (0.1 %) nasal spray 1 spray by Each Nare route daily as needed.      ??? blood sugar diagnostic Strp Use as directed Three (3) times a day before meals. 90 each 11   ??? calcium carbonate (TUMS) 200 mg calcium (500 mg) chewable tablet Chew 1 tablet Three (3) times a day as needed for heartburn.      ??? cholecalciferol, vitamin D3, 1,000 unit (25 mcg) tablet Take 2 tablets (2,000 Units total) by mouth daily. 60 tablet 11   ??? docusate sodium (COLACE) 100 MG capsule Take 1 capsule (100 mg total) by mouth two (2) times a day as needed for constipation. 60 capsule 0   ??? fluticasone propionate (FLONASE) 50 mcg/actuation nasal spray PLACE ONE OR TWO SPRAYS INTO BOTH NOSTRILS DAILY AS NEEDED.     ??? furosemide (LASIX) 20 MG tablet Take 2 tablets (40 mg total) by mouth daily as needed for swelling. 60 tablet 11   ??? lancets 33 gauge Misc 1 each by Miscellaneous route Three (3) times a day before meals. 100 each 11   ??? levothyroxine (SYNTHROID) 150 MCG tablet Take 1 tablet (150 mcg total) by mouth daily. 30 tablet 11   ??? magnesium oxide-Mg AA chelate (MAGNESIUM, AMINO ACID CHELATE,) 133 mg Tab Take 1 tablet by mouth Two (2)  times a day. HOLD until directed to start by your coordinator. (Patient not taking: Reported on 04/17/2019) 60 tablet 11   ??? mycophenolate (MYFORTIC) 180 MG EC tablet Take 2 tablets (360 mg total) by mouth Two (2) times a day. 120 tablet 11   ??? pravastatin (PRAVACHOL) 40 MG tablet Take 1 tablet (40 mg total) by mouth every evening. 90 tablet 3   ??? sertraline (ZOLOFT) 25 MG tablet Take 1 tablet (25 mg total) by mouth daily. 30 tablet 11   ??? tacrolimus (ENVARSUS XR) 0.75 mg Tb24 extended release tablet Take 2 tablets (1.5 mg total) by mouth daily with 2 (1 mg) tablets for a total dose of 3.5 mg daily 60 tablet 3   ??? tacrolimus (ENVARSUS XR) 1 mg Tb24 extended release tablet Take 2 tablets (2 mg) by mouth daily with 2 (0.75 mg) tablets for a total dose of 3.5 mg daily 60 tablet 3   ??? tacrolimus (PROGRAF) 0.5 MG capsule Take Six caps (3mg ) in AM and Take Five caps (2.5mg ) at PM 330 capsule 11   ??? testosterone (ANDROGEL) 1 % (25 mg/2.5gram) GlPk Apply 2.5g (1 packet) to each shoulder/upper arm daily for a total daily dose of 5g. 60 packet 3     Facility-Administered Encounter Medications as of 06/19/2019   Medication Dose Route Frequency Provider Last Rate Last Admin   ??? diazePAM (VALIUM) 5 mg/mL injection            ??? diazePAM (VALIUM) injection 5 mg  5 mg Intravenous Q10 Min PRN Pierce Crane, MD       ??? diazePAM (VALIUM) injection 5 mg  5 mg Intravenous Q5 Min PRN Pierce Crane, MD   5 mg at 06/19/19 1123   ??? diazePAM (VALIUM) 5 mg/mL injection                Induction agent : basiliximab    CURRENT IMMUNOSUPPRESSION: Envarsus 3.5 mg PO daily (switched from IR tac due to cost)   prograf/Envarsus/cyclosporine goal: 6-8   myfortic360  mg PO bid    steroid free     Patient complains of mild tremor (unchanged from prior)    IMMUNOSUPPRESSION DRUG LEVELS:  Lab Results   Component Value Date    Tacrolimus, Trough 6.5 04/17/2019    Tacrolimus, Trough 7.9 12/28/2018    Tacrolimus, Trough 11.0 12/27/2018    Tacrolimus Lvl 8.0 06/13/2019    Tacrolimus Lvl 6.7 06/06/2019    Tacrolimus Lvl 7.0 05/30/2019     No results found for: CYCLO  No results found for: EVEROLIMUS  No results found for: SIROLIMUS    Envarsus level is accurate 24 hour trough    Graft function: stable  DSA: ntd   Explant biopsy: well differentiated HCC with no vascular invasion, focal high grade dysplastic nodule with small cell change in segment V  Biopsies to date: ntd  WBC/ANC:  wnl     Plan: Will decrease Envarsus to 3 mg daily and Myfortic to 180 mg BID given HCC history.  Continue to monitor.    OI Prophylaxis:   CMV Status: D-/ R+, moderate risk. CMV prophylaxis: valganciclovir 450 mg daily x 3 months (complete)  No results found for: CMVCP  PCP Prophylaxis: bactrim SS 1 tab MWF x 6 months (complete)   Thrush:  completed in hospital  Patient is  off prophylaxis    Plan: prophylaxis completed. Continue to monitor.    CV Prophylaxis: asa 81 mg   The 10-year  ASCVD risk score Denman George DC Jr., et al., 2013) is: 11.7%  Statin therapy: Indicated; pravastatin 40 mg daily  Plan:  Continue to monitor     BP: Goal < 140/90. Clinic vitals reported above  Home BP ranges: 130-135s/90s  Current meds include: amlodipine 5 mg daily  Plan: diastolic slightly above goal. Increase amlodipine to 10 mg daily.  Continue to monitor    Anemia:  H/H:   Lab Results   Component Value Date    HGB 14.2 06/19/2019     Lab Results   Component Value Date    HCT 43.3 06/19/2019     Iron panel:  Lab Results   Component Value Date    IRON 157 01/04/2018    TIBC 305.9 01/04/2018    FERRITIN 116.0 01/04/2018     Lab Results   Component Value Date    Iron Saturation (%) 51 (H) 01/04/2018       Prior ESA use: none post transplant    Plan: stable, within goal. Continue to monitor.     DM:   Lab Results   Component Value Date    A1C 6.0 (H) 04/17/2019   . Goal A1c < 7  History of Dm? Yes: T2DM  Established with endocrinologist/PCP for BG managment? Yes: PCP  Currently on: N/A  Home BS log: not checking - FBG on labs in 120s  Diet:did not address  Exercise: working on cars  Hypoglycemia: no  Plan:  Start metformin XR 500 mg daily x7d then increase to BID to help w/weight loss and for pre-diabetes.    Electrolytes: wnl  Meds currently on: none  Plan: Continue to monitor     GI/BM: Pt reports 1-2BM daily with occasional diarrhea  Meds currently on: TUMS prn (takes a few times per week)  Plan: Continue to monitor     Pain: pt reports moderate pain; abdominal pain is below ribcage while mid-back pain may be MSK  Meds currently on: APAP (no recent use), gabapentin 600 mg BID (not taking - self-stopped this past week after tapering off week prior)  Plan: Continue to monitor off gabapentin, removed from med list. Continue to monitor    Bone health: Vitamin D Level: 34.9 on 04/17/19. Goal > 30.   Last DEXA results: osteoporosis of spine 01/04/18 - Spine T score - 2.6, femur T score -1.3  Current meds include: cholecalciferol 2000 units daily, alendronate 70 mg weekly   Plan: Vitamin D level  within goal,continue supplementation. Continue to monitor.     Men's Health:  Anthony Alvarado is a 59 y.o. male. Patient reports night sweats   Meds currently on testosterone 5g daily  Plan: Increase testosterone to 10g daily  and recheck level at 1 year visit. Continue to monitor    Hypothyroidism  Meds currently on: levothyroxine 150 mcg  TSH 1.44, FT4 1.29  Plan: Continue to monitor    Anxiety/depression  Meds currently on: sertraline 25 mg daily  Plan: Continue to monitor    Allergies  Meds currently on: azelastin PRN, Flonase PRN  Plan: continue to monitor     Rosacea  Meds currently on: none (previously on metrogel PRN and Eucrisa ointment)  Plan: Continue to monitor    Low testosterone  Meds currently on: testosterone gel 5 g daily  Plan: Cotnin    Adherence: Patient has excellent understanding of medications; was able to independently identify names/doses of immunosuppressants and OI meds.  Patient  does fill their own pill box on  a regular basis at home.  Patient brought medication card:no  Pill box:did not bring  Plan: Provided basic adherence counseling/intervention    I spent a total of 20 minutes face to face with the patient delivering clinical care and providing education/counseling.    Patient was reviewed with Gertie Fey, DNP who was agreement with the stated plan:     During this visit, the following was completed:   BG log data assessment  BP log data assessment  Labs ordered and evaluated  complex treatment plan >1 DS     All questions/concerns were addressed to the patient's satisfaction.  __________________________________________    Cleone Slim, PHARMD, CPP  [employer] [facility] CLINICAL PHARMACIST PRACTITIONER  PAGER [phone]

## 2019-06-19 NOTE — Unmapped (Signed)
Urine was collected and sent to the lab.

## 2019-06-19 NOTE — Unmapped (Signed)
TRANSPLANT SURGERY FOLLOW UP CLINIC NOTE     Assessment and Plan:   Anthony Alvarado is a 59 y.o. male who underwent OLT on 09/16/2018 for cryptogenic cirrhosis with HCC (subsequently found to have A1AT on biopsy)  who presents for 9 month posttransplant follow up    Immunosuppression:   - on envarsus 4/19, above goal decrease to 3mg  daily (goal 6-8)  -MMF decrease from 360mg  BID to 180mg  BID    History of HCC  - MRI and AFP stable, no concern for disease recurrence    Night Sweats  - increase testosterone    Metformin for elevated fasting BG and central obseity.     UA for back pain to r/o stone. WNL.    Follow up with PCP for ear lavage given cerumen impaction, consider debrox drops in interim and claritin.    Immunization History   Administered Date(s) Administered   ??? INFLUENZA TIV (TRI) 59MO+ W/ PRESERV (IM) 11/18/2011   ??? Influenza Vaccine Quad (IIV4 PF) 102mo+ injectable 01/25/2014, 02/11/2015, 01/19/2017, 01/11/2018, 10/28/2018   ??? Influenza Virus Vaccine, unspecified formulation 01/11/2018, 11/23/2018   ??? Influenza Whole 12/27/2007, 10/29/2009   ??? PNEUMOCOCCAL POLYSACCHARIDE 23 01/11/2018   ??? Pneumococcal Conjugate 13-Valent 04/17/2019   ??? Tetanus and diptheria,(adult), adsorbed, 2Lf tetanus toxoid, PF 03/05/1997, 10/09/2008       Follow up: 1 year post transplant visit  Labs: weekly    I personally spent 30 minutes face-to-face and non-face-to-face in the care of this patient, which includes all pre, intra, and post visit time on the date of service. Greater than 50% of the time was spent on counseling and the substance of the discussion.    History of Present Illness:   Anthony Alvarado is a 59 y.o. male with cryptogenic cirrhosis and HCC s/p OLT 09/16/2018.  PMH additionally notable for severe aortic valve stenosis, HTN, DMII, OSA and Autoimmune cholangitis, carrier of hemochromatosis HFE gene mutation.      Of note explant remonstrated viable HCC within segment 6 but without vascular, lymph, or bile duct invasion. There was also a high grade dysplastic nodule with cirrhotic changes in segment 5. Explant also potentially consistent with underlying A1AT.     Initial post-operative course complicated by early pneumonia mid august 2020.  He was again hospitalized from 10/6-10/8 for chest pain with negative ECG and CTA. Tpn was 0.039 and downtrended. MRI obtained for [facility] surveillance which showed stable posterior hepatic fluid collection (post-op hematoma unchanged in size) and loculated ascites along the falciform ligament, no enhancement to suggest abscess. VIR performed percutaneous aspiration of perihepatic fluid collection on 10/8. Patient also had new onset back pain with negative plain films - he was seen by ortho who recommended PT as an outpatient.    Admitted again for recurrence of r sided pleural effusion and associated SOB 11/2-11/4. underwent thoracentesis with Interventional Pulmonology on 11/3 with 1L serosanguinous fluid evacuated. Pleural fluid analysis showed eosiniphillic predominance. He redeveloped a pleural effusion 11/18, with loculations, underwent pigtail catheter placement by interventional pulmonology with 2L of output. Subsequent Thoracic Surgery consult in the setting of multiple pleural effusions now with loculations and a residual basilar pneumothorax in the right lung who recommended and completed Right pulmonary decortication and pleurodesis on 01/13/2019.    From a post-op standpoint he has recovered well from this procedure and has weaned off all pain medications. Unfortunately, in the interim, he contracted COVID treated with BAM in the outpatient setting without significant pulmonary compromise.  He denies acute complaint today. No N/V/D/C. He has continued, since pre transplant to experience night sweats and low stamina. He has occasional headaches from his tacrolimus.    Current Outpatient Medications   Medication Sig Dispense Refill   ??? acetaminophen (TYLENOL) 325 MG tablet Take 2 tablets (650 mg total) by mouth every six (6) hours as needed for pain or fever (> 38C or 100.65F). 100 tablet 0   ??? alendronate (FOSAMAX) 70 MG tablet Take 1 tablet (70 mg total) by mouth every seven (7) days. 4 tablet 11   ??? aspirin (ECOTRIN) 81 MG tablet Take 1 tablet (81 mg total) by mouth daily. 30 tablet 11   ??? azelastine (ASTELIN) 137 mcg (0.1 %) nasal spray 1 spray by Each Nare route daily as needed.      ??? calcium carbonate (TUMS) 200 mg calcium (500 mg) chewable tablet Chew 1 tablet Three (3) times a day as needed for heartburn.      ??? cholecalciferol, vitamin D3, 1,000 unit (25 mcg) tablet Take 2 tablets (2,000 Units total) by mouth daily. 60 tablet 11   ??? fluticasone propionate (FLONASE) 50 mcg/actuation nasal spray PLACE ONE OR TWO SPRAYS INTO BOTH NOSTRILS DAILY AS NEEDED.     ??? levothyroxine (SYNTHROID) 150 MCG tablet Take 1 tablet (150 mcg total) by mouth daily. 30 tablet 11   ??? pravastatin (PRAVACHOL) 40 MG tablet Take 1 tablet (40 mg total) by mouth every evening. 90 tablet 3   ??? sertraline (ZOLOFT) 25 MG tablet Take 1 tablet (25 mg total) by mouth daily. 30 tablet 11   ??? amLODIPine (NORVASC) 10 MG tablet Take 1 tablet (10 mg total) by mouth daily. 30 tablet 11   ??? blood sugar diagnostic Strp Use as directed Three (3) times a day before meals. 90 each 11   ??? lancets 33 gauge Misc 1 each by Miscellaneous route Three (3) times a day before meals. 100 each 11   ??? metFORMIN (GLUCOPHAGE XR) 500 MG 24 hr tablet Take 1 tablet (500 mg total) by mouth two (2) times a day. 60 tablet 11   ??? mycophenolate (MYFORTIC) 180 MG EC tablet Take 1 tablet (180 mg total) by mouth Two (2) times a day. 60 tablet 11   ??? tacrolimus (ENVARSUS XR) 0.75 mg Tb24 extended release tablet Hold 60 tablet 3   ??? tacrolimus (ENVARSUS XR) 1 mg Tb24 extended release tablet Take 3 tablets (3 mg total) by mouth daily. 90 tablet 11   ??? testosterone (ANDROGEL) 1 % (25 mg/2.5gram) GlPk Apply 5g (2 packet) to each shoulder/upper arm daily for a total daily dose of 10g. 120 packet 3     No current facility-administered medications for this visit.     Facility-Administered Medications Ordered in Other Visits   Medication Dose Route Frequency Provider Last Rate Last Admin   ??? diazePAM (VALIUM) 5 mg/mL injection                Physical Exam:  BP 124/88 (BP Site: R Arm, BP Position: Sitting, BP Cuff Size: Large)  - Pulse 68  - Temp 36.5 ??C (97.7 ??F) (Tympanic)  - Ht 172.7 cm (5' 8)  - Wt 90.3 kg (199 lb 1.6 oz)  - BMI 30.27 kg/m??   General Appearance:  No acute distress, well appearing and well nourished.   Head:  Normocephalic, atraumatic.   Eyes:  No scleral icterus.   Pulmonary:    Normal respiratory effort. Decreased breath sounds at right  base.   Cardiovascular:  Regular rate and rhythm.   Abdomen:   Scar well healed   Neurologic: Non-focal exam.         Lab Results   Component Value Date    WBC 9.2 06/19/2019    HGB 14.2 06/19/2019    HCT 43.3 06/19/2019    PLT 237 06/19/2019     Lab Results   Component Value Date    NA 141 06/19/2019    K 5.4 (H) 06/19/2019    CL 104 06/19/2019    CO2 26.0 06/19/2019    BUN 22 (H) 06/19/2019    CREATININE 1.01 06/19/2019    CALCIUM 9.2 06/19/2019    MG 1.5 (L) 06/19/2019    PHOS 3.6 06/19/2019      Lab Results   Component Value Date    ALKPHOS 97 06/19/2019    BILITOT 0.6 06/19/2019    BILIDIR <0.10 06/19/2019    PROT 7.4 06/19/2019    ALBUMIN 4.8 06/19/2019    ALT 31 06/19/2019    AST 24 06/19/2019    GGT 38 06/19/2019      Lab Results   Component Value Date    APTT 29.6 10/12/2018        IMAGING:  Mri Abdomen W Wo Contrast  Result Date: 06/19/2019  IMPRESSION:  Sequelae of liver transplantation. No worrisome hepatic lesions or evidence of metastatic disease to the abdomen.        Gertie Fey, DNP, APRN, FNP-C  [facility]  [address]  [place], [place]  [address]

## 2019-06-20 LAB — CMV DNA, QUANTITATIVE, PCR

## 2019-06-20 LAB — CMV QUANT LOG10: Lab: 0

## 2019-06-20 MED ORDER — AMLODIPINE 10 MG TABLET
ORAL_TABLET | Freq: Every day | ORAL | 11 refills | 30 days | Status: CP
Start: 2019-06-20 — End: 2020-06-19
  Filled 2019-06-28: qty 30, 30d supply, fill #0

## 2019-06-20 NOTE — Unmapped (Signed)
Change in Envarsus dosage decrease. Co-pay $5.67.  Change in Mycophenolate dosage decrease. Refill too soon until 07/06/2019. Will attempt test claim on 07/06/2019 date.

## 2019-06-21 ENCOUNTER — Ambulatory Visit: Admit: 2019-06-21 | Discharge: 2019-06-22 | Payer: MEDICARE | Attending: Dermatology | Primary: Dermatology

## 2019-06-21 DIAGNOSIS — Z79899 Other long term (current) drug therapy: Principal | ICD-10-CM

## 2019-06-21 MED FILL — METFORMIN ER 500 MG TABLET,EXTENDED RELEASE 24 HR: 30 days supply | Qty: 60 | Fill #0 | Status: AC

## 2019-06-21 NOTE — Unmapped (Signed)
Dermatology Note     Assessment and Plan:      Benign Lesions/ Findings:   Lentigo/Lentigines  Nevus/Nevi-Benign Appearing  Seborrheic Keratosis(es) - no irritation noted  - Reassurance provided regarding the benign appearance of lesions noted on exam today; no treatment is indicated in the absence of symptoms/changes.  - Reinforced importance of photoprotective strategies including liberal and frequent sunscreen use of a broad-spectrum SPF 30 or greater, use of protective clothing, and sun avoidance for prevention of cutaneous malignancy and photoaging.  Counseled patient on the importance of regular self-skin monitoring as well as routine clinical skin examinations as scheduled.     Personal history of liver transplant on immunosuppression:  - Discussed maintaining vigilance and counseled on sun protection as above.    ETT rosacea - offered laser, patient declined  -cont metrocream prn for papules    The patient was advised to call for an appointment should any new, changing, or symptomatic lesions develop.     RTC: Return in about 1 year (around 06/20/2020). or sooner as needed   _________________________________________________________________      Chief Complaint     Chief Complaint   Patient presents with   ??? Skin Check     fbse,no concerns,  hx of liver transplant       HPI     Anthony Alvarado is a 59 y.o. male who presents as a patient who is seen in consultation by Everrett Coombe, MD at the request of [name], MD to [facility] for skin check in the setting of immunosuppression..  Currently on immunosuppression after liver transplant last year.  No history of skin cancer in the past.  Denies any new or changing lesions.  Does note he has a history of rosacea and uses MetroCream.  Has several brown spots on his arms and legs.  None are new or changing.  No prior treatment.    The patient denies any other new or changing lesions or areas of concern.     Pertinent Past Medical History No history of skin cancer    Problem List     None            Family History:   Negative for melanoma    Past Medical History, Family History, Social History, Medication List, Allergies, and Problem List were reviewed in the rooming section of Epic.     ROS: Other than symptoms mentioned in the HPI, no fevers, chills, or other skin complaints    Physical Examination     GENERAL: Well-appearing Fitzpatrick skin type II male in no acute distress, resting comfortably.  NEURO: Alert and oriented, answers questions appropriately  SKIN (Full Skin Exam): Examination of the face, eyelids, lips, nose, ears, neck, chest, abdomen, back, arms, legs, hands, feet, palms, soles, nails was performed  - Lentigo/lentigines: Scattered pigmented macules that are tan to brown in color and are somewhat non-uniform in shape and concentrated in the sun-exposed areas of the trunk  - Nevus/nevi: Scattered well-demarcated, regular, pigmented macule(s) and/or papule(s) on the trunk  - Seborrheic Keratosis(es): Stuck-on appearing keratotic papule(s) on the trunk, none irritated with redness, crusting, edema, and/or partial avulsion  -Mid facial erythema without papules    (Approved Template 06/19/2019)

## 2019-06-21 NOTE — Unmapped (Signed)
Basic Skin Care  Your skin plays an important role in keeping the entire body healthy.  Below are some tips on how to try and maximize skin health from the outside in.    1) Bathe in mildly warm water every 1 to 2 days, followed by light drying and an application of a thick moisturizer cream or ointment, preferably one that comes in a tub.  a. Fragrance free moisturizing bars or body washes are preferred such as Purpose, Cetaphil, Dove sensitive skin, Aveeno, or Vanicream products.  b. Use a fragrance free cream or ointment, not a lotion, such as plain petroleum jelly or Vaseline ointment, Aquaphor, Vanicream, Eucerin cream or a generic version, CeraVe Cream, Cetaphil Restoraderm, Aveeno Eczema Therapy and [employer], among others.  c. People with very dry skin often need to put on these creams two, three or four times a day.  As much as possible, use these creams enough to keep the skin from looking dry.  d. Consider using fragrance free/dye free detergent, such as Arm and Hammer for sensitive skin, Tide Free or All Free.     2) If I am prescribing a medication to go on the skin, the medicine goes on first to the areas that need it, followed by a thick cream as above to the entire body.    3) Wynelle Link is a major cause of damage to the skin.  a. I recommend sun protection for all of my patients. I prefer physical barriers such as hats with wide brims that cover the ears, long sleeve clothing with SPF protection including rash guards for swimming. These can be found seasonally at outdoor clothing companies, [employer] and [employer] and online at [email or web address] [email or web address], www.uvskinz.com and BrideEmporium.nl. Avoid peak sun between the hours of 10am to 3pm to minimize sun exposure.    b. I recommend sunscreen for all of my patients older than 26 months of age when in the sun, preferably with broad spectrum coverage and SPF 30 or higher.   i. For sensitive skin: I recommend sunscreens that only contain titanium dioxide and/or zinc oxide in the active ingredients. These do not burn the eyes and appear to be safer than chemical sunscreens. Products includ Vanicream Broad Spectrum 50+, Aveeno Natural Mineral Protection, Neutrogena Pure and Free Baby, [employer] Daily face and body lotion, and EltaMD.  ii. There is no such thing as waterproof sunscreen. All sunscreens should be reapplied after 60-80 minutes of wear.   Iii. For those that prefer not to use cream-based sunscreen, there are alternatives such as SPRAYS and STICKS.  These are also good options if you are sweaty or working out.  Spray on sunscreens often use chemical sunscreens which do protect against the sun. However, these can be difficult to apply correctly, especially if wind is present, and can be more likely to irritate the skin.  I recommend applying it at least twice over the involved areas.  For the stick, I recommend Neutrogena.  It looks like a deodorant stick.  It must be applied 4 times over the same area to protect as much as the cream.      c. I also recommend discussing Vitamin D supplementation with your primary care doctor as patients typically do not get enough Vitamin D through the skin in our area, even without using sunscreen.

## 2019-06-21 NOTE — Unmapped (Addendum)
[facility] Refill Coordination Note    Specialty Medication(s) to be Shipped:   Transplant: Envarsus 1mg  and mycophenolate mofetil 180mg     Other medication(s) to be shipped: sertraline 25mg ,alendronate 70mg      [name], DOB: November 03, 1960  Phone: [phone] (home)       All above HIPAA information was verified with patient.     Was a Nurse, learning disability used for this call? No    Completed refill call assessment today to schedule patient's medication shipment from the [facility] [phone]).       Specialty medication(s) and dose(s) confirmed: Patient reports changes to the regimen as follows: Envarsus 1mg  increased to 3 tablets daily,and Mycophenolate decreased to 1 tablet twice a day   Changes to medications: [name] reports no changes at this time.  Changes to insurance: No  Questions for the pharmacist: No    Confirmed patient received Welcome Packet with first shipment. The patient will receive a drug information handout for each medication shipped and additional FDA Medication Guides as required.       DISEASE/MEDICATION-SPECIFIC INFORMATION        N/A    SPECIALTY MEDICATION ADHERENCE     Medication Adherence    Patient reported X missed doses in the last month: 0  Specialty Medication: envarsus 1mg   Patient is on additional specialty medications: Yes  Additional Specialty Medications: Mycophenolate 180mg   Patient Reported Additional Medication X Missed Doses in the Last Month: 0  Patient is on more than two specialty medications: No  Informant: patient  Reliability of informant: reliable  Patient is at risk for Non-Adherence: No                Mycophenolate 180   mg: 10 days of medicine on hand   Envarsus 1 mg: 10 days of medicine on hand         SHIPPING     Shipping address confirmed in Epic.     Delivery Scheduled: Yes, Expected medication delivery date: 06/29/19- Envarsus; 07/07/19-Mycophenolate.     Medication will be delivered via UPS to the prescription address in Epic W[facility].    Antonietta Barcelona   [facility] Specialty Technician

## 2019-06-22 NOTE — Unmapped (Signed)
Patient's 9 mos serial MRI from 4/26 reviewed by Dr.Desai without recommendation for f/u before his annual MRI.

## 2019-06-26 DIAGNOSIS — E612 Magnesium deficiency: Principal | ICD-10-CM

## 2019-06-26 DIAGNOSIS — Z944 Liver transplant status: Principal | ICD-10-CM

## 2019-06-26 DIAGNOSIS — Z5181 Encounter for therapeutic drug level monitoring: Principal | ICD-10-CM

## 2019-06-27 DIAGNOSIS — Z944 Liver transplant status: Secondary | ICD-10-CM | POA: Diagnosis not present

## 2019-06-27 DIAGNOSIS — E612 Magnesium deficiency: Secondary | ICD-10-CM | POA: Diagnosis not present

## 2019-06-27 DIAGNOSIS — Z5181 Encounter for therapeutic drug level monitoring: Secondary | ICD-10-CM | POA: Diagnosis not present

## 2019-06-28 LAB — COMPREHENSIVE METABOLIC PANEL
ALBUMIN: 5 g/dL — ABNORMAL HIGH (ref 3.8–4.9)
ALKALINE PHOSPHATASE: 129 IU/L — ABNORMAL HIGH (ref 39–117)
ALT (SGPT): 29 IU/L (ref 0–44)
AST (SGOT): 21 IU/L (ref 0–40)
BLOOD UREA NITROGEN: 24 mg/dL (ref 6–24)
BUN / CREAT RATIO: 23 — ABNORMAL HIGH (ref 9–20)
CALCIUM: 9.6 mg/dL (ref 8.7–10.2)
CHLORIDE: 106 mmol/L (ref 96–106)
CO2: 23 mmol/L (ref 20–29)
CREATININE: 1.04 mg/dL (ref 0.76–1.27)
GFR MDRD AF AMER: 92 mL/min/{1.73_m2}
GFR MDRD NON AF AMER: 79 mL/min/{1.73_m2}
GLOBULIN, TOTAL: 2.3 g/dL (ref 1.5–4.5)
GLUCOSE: 122 mg/dL — ABNORMAL HIGH (ref 65–99)
POTASSIUM: 5 mmol/L (ref 3.5–5.2)
SODIUM: 140 mmol/L (ref 134–144)
TOTAL PROTEIN: 7.3 g/dL (ref 6.0–8.5)

## 2019-06-28 LAB — GAMMA GLUTAMYL TRANSFERASE: Gamma glutamyl transferase:CCnc:Pt:Ser/Plas:Qn:: 30

## 2019-06-28 LAB — CBC W/ DIFFERENTIAL
BANDED NEUTROPHILS ABSOLUTE COUNT: 0.1 10*3/uL (ref 0.0–0.1)
BASOPHILS ABSOLUTE COUNT: 0.1 10*3/uL (ref 0.0–0.2)
BASOPHILS RELATIVE PERCENT: 1 %
EOSINOPHILS ABSOLUTE COUNT: 0.2 10*3/uL (ref 0.0–0.4)
EOSINOPHILS RELATIVE PERCENT: 3 %
HEMATOCRIT: 43.4 % (ref 37.5–51.0)
HEMOGLOBIN: 14.3 g/dL (ref 13.0–17.7)
IMMATURE GRANULOCYTES: 1 %
LYMPHOCYTES ABSOLUTE COUNT: 1.9 10*3/uL (ref 0.7–3.1)
LYMPHOCYTES RELATIVE PERCENT: 22 %
MEAN CORPUSCULAR HEMOGLOBIN CONC: 32.9 g/dL (ref 31.5–35.7)
MEAN CORPUSCULAR HEMOGLOBIN: 27.9 pg (ref 26.6–33.0)
MEAN CORPUSCULAR VOLUME: 85 fL (ref 79–97)
MONOCYTES ABSOLUTE COUNT: 0.9 10*3/uL (ref 0.1–0.9)
MONOCYTES RELATIVE PERCENT: 11 %
NEUTROPHILS ABSOLUTE COUNT: 5.5 10*3/uL (ref 1.4–7.0)
PLATELET COUNT: 219 10*3/uL (ref 150–450)
RED BLOOD CELL COUNT: 5.12 x10E6/uL (ref 4.14–5.80)
WHITE BLOOD CELL COUNT: 8.7 10*3/uL (ref 3.4–10.8)

## 2019-06-28 LAB — MAGNESIUM: Magnesium:MCnc:Pt:Ser/Plas:Qn:: 1.5 — ABNORMAL LOW

## 2019-06-28 LAB — BILIRUBIN TOTAL: Bilirubin:MCnc:Pt:Ser/Plas:Qn:: 0.3

## 2019-06-28 LAB — PHOSPHORUS, SERUM: Phosphate:MCnc:Pt:Ser/Plas:Qn:: 3.7

## 2019-06-28 LAB — BILIRUBIN DIRECT: Bilirubin.glucuronidated+Bilirubin.albumin bound:MCnc:Pt:Ser/Plas:Qn:: 0.12

## 2019-06-28 LAB — EOSINOPHILS ABSOLUTE COUNT: Eosinophils:NCnc:Pt:Bld:Qn:Automated count: 0.2

## 2019-06-28 MED FILL — ALENDRONATE 70 MG TABLET: 28 days supply | Qty: 4 | Fill #7 | Status: AC

## 2019-06-28 MED FILL — AMLODIPINE 10 MG TABLET: 30 days supply | Qty: 30 | Fill #0 | Status: AC

## 2019-06-28 MED FILL — ENVARSUS XR 1 MG TABLET,EXTENDED RELEASE: 30 days supply | Qty: 90 | Fill #0 | Status: AC

## 2019-06-28 MED FILL — ALENDRONATE 70 MG TABLET: ORAL | 28 days supply | Qty: 4 | Fill #7

## 2019-06-28 NOTE — Unmapped (Signed)
Anthony Alvarado 's MYCOPHENOLATE shipment will be sent out  as a result of the medication is too soon to refill until 5/13.     I have reached out to the patient and communicated the delivery change. We will reschedule the medication for the delivery date that the patient agreed upon.  We have confirmed the delivery date as 5/14, via ups.

## 2019-06-29 LAB — TACROLIMUS BLOOD: Tacrolimus:MCnc:Pt:Bld:Qn:LC/MS/MS: 8.6

## 2019-06-29 MED FILL — SERTRALINE 25 MG TABLET: ORAL | 30 days supply | Qty: 30 | Fill #4

## 2019-06-29 MED FILL — SERTRALINE 25 MG TABLET: 30 days supply | Qty: 30 | Fill #4 | Status: AC

## 2019-06-29 NOTE — Unmapped (Signed)
Patient tolerating 4/26 mmf reduction well and Envarsus started on 4/17. Per NP Martin-Velez, will see one more week of therapeutic results and reduce lab interal to once every two weeks.

## 2019-07-03 DIAGNOSIS — Z5181 Encounter for therapeutic drug level monitoring: Principal | ICD-10-CM

## 2019-07-03 DIAGNOSIS — E612 Magnesium deficiency: Principal | ICD-10-CM

## 2019-07-03 DIAGNOSIS — Z944 Liver transplant status: Principal | ICD-10-CM

## 2019-07-04 DIAGNOSIS — E612 Magnesium deficiency: Secondary | ICD-10-CM | POA: Diagnosis not present

## 2019-07-04 DIAGNOSIS — Z5181 Encounter for therapeutic drug level monitoring: Secondary | ICD-10-CM | POA: Diagnosis not present

## 2019-07-04 DIAGNOSIS — Z944 Liver transplant status: Secondary | ICD-10-CM | POA: Diagnosis not present

## 2019-07-05 LAB — CBC W/ DIFFERENTIAL
BANDED NEUTROPHILS ABSOLUTE COUNT: 0.1 10*3/uL (ref 0.0–0.1)
BASOPHILS ABSOLUTE COUNT: 0.1 10*3/uL (ref 0.0–0.2)
BASOPHILS RELATIVE PERCENT: 1 %
EOSINOPHILS RELATIVE PERCENT: 3 %
HEMATOCRIT: 43.9 % (ref 37.5–51.0)
HEMOGLOBIN: 14.7 g/dL (ref 13.0–17.7)
IMMATURE GRANULOCYTES: 1 %
LYMPHOCYTES ABSOLUTE COUNT: 2 10*3/uL (ref 0.7–3.1)
LYMPHOCYTES RELATIVE PERCENT: 23 %
MEAN CORPUSCULAR HEMOGLOBIN CONC: 33.5 g/dL (ref 31.5–35.7)
MEAN CORPUSCULAR VOLUME: 84 fL (ref 79–97)
MONOCYTES ABSOLUTE COUNT: 0.9 10*3/uL (ref 0.1–0.9)
MONOCYTES RELATIVE PERCENT: 10 %
NEUTROPHILS ABSOLUTE COUNT: 5.6 10*3/uL (ref 1.4–7.0)
PLATELET COUNT: 235 10*3/uL (ref 150–450)
RED BLOOD CELL COUNT: 5.21 x10E6/uL (ref 4.14–5.80)
RED CELL DISTRIBUTION WIDTH: 13.8 % (ref 11.6–15.4)
WHITE BLOOD CELL COUNT: 8.9 10*3/uL (ref 3.4–10.8)

## 2019-07-05 LAB — COMPREHENSIVE METABOLIC PANEL
A/G RATIO: 2.3 — ABNORMAL HIGH (ref 1.2–2.2)
ALBUMIN: 5.1 g/dL — ABNORMAL HIGH (ref 3.8–4.9)
ALKALINE PHOSPHATASE: 141 IU/L — ABNORMAL HIGH (ref 39–117)
ALT (SGPT): 34 IU/L (ref 0–44)
AST (SGOT): 23 IU/L (ref 0–40)
BLOOD UREA NITROGEN: 29 mg/dL — ABNORMAL HIGH (ref 6–24)
BUN / CREAT RATIO: 27 — ABNORMAL HIGH (ref 9–20)
CALCIUM: 9.4 mg/dL (ref 8.7–10.2)
CHLORIDE: 101 mmol/L (ref 96–106)
CO2: 21 mmol/L (ref 20–29)
GFR MDRD AF AMER: 87 mL/min/{1.73_m2}
GFR MDRD NON AF AMER: 75 mL/min/{1.73_m2}
GLOBULIN, TOTAL: 2.2 g/dL (ref 1.5–4.5)
POTASSIUM: 5.1 mmol/L (ref 3.5–5.2)
SODIUM: 136 mmol/L (ref 134–144)
TOTAL PROTEIN: 7.3 g/dL (ref 6.0–8.5)

## 2019-07-05 LAB — GLOBULIN, TOTAL: Globulin:MCnc:Pt:Ser:Qn:Calculated: 2.2

## 2019-07-05 LAB — BILIRUBIN DIRECT: Bilirubin.glucuronidated+Bilirubin.albumin bound:MCnc:Pt:Ser/Plas:Qn:: 0.14

## 2019-07-05 LAB — MAGNESIUM: Magnesium:MCnc:Pt:Ser/Plas:Qn:: 1.6

## 2019-07-05 LAB — MONOCYTES RELATIVE PERCENT: Monocytes/100 leukocytes:NFr:Pt:Bld:Qn:Automated count: 10

## 2019-07-05 LAB — GAMMA GLUTAMYL TRANSFERASE: Gamma glutamyl transferase:CCnc:Pt:Ser/Plas:Qn:: 37

## 2019-07-05 LAB — PHOSPHORUS, SERUM: Phosphate:MCnc:Pt:Ser/Plas:Qn:: 3.2

## 2019-07-06 MED FILL — MYCOPHENOLATE SODIUM 180 MG TABLET,DELAYED RELEASE: 30 days supply | Qty: 60 | Fill #0 | Status: AC

## 2019-07-06 NOTE — Unmapped (Signed)
Change in Mycophenolate dosage decrease. Co-pay $0.00. Going to patient today.

## 2019-07-07 LAB — TACROLIMUS BLOOD: Tacrolimus:MCnc:Pt:Bld:Qn:LC/MS/MS: 11.8

## 2019-07-10 DIAGNOSIS — Z944 Liver transplant status: Principal | ICD-10-CM

## 2019-07-10 DIAGNOSIS — E612 Magnesium deficiency: Principal | ICD-10-CM

## 2019-07-10 DIAGNOSIS — Z5181 Encounter for therapeutic drug level monitoring: Principal | ICD-10-CM

## 2019-07-10 MED ORDER — ENVARSUS XR 1 MG TABLET,EXTENDED RELEASE
ORAL_TABLET | Freq: Every day | ORAL | 11 refills | 30.00000 days | Status: CP
Start: 2019-07-10 — End: ?

## 2019-07-10 NOTE — Unmapped (Signed)
Patient's 5/11 tac levels supratherapeutic. Reviewed with NP Martin-Velez and txp pharmD Mincemoyer who agreed patient's envarsus should be reduced to 2mg  daily. Spoke with patient who verified he was taking the prescribed dosage and the trough was valid. Relayed dose change. He verbalized understanding and agreed to repeat labs next week and the week following.

## 2019-07-10 NOTE — Unmapped (Signed)
Change in Envarsus dosage decrease. Refill too soon until 07/21/2019. Will attempt test claim on 07/21/2019 date.

## 2019-07-17 DIAGNOSIS — Z944 Liver transplant status: Principal | ICD-10-CM

## 2019-07-17 DIAGNOSIS — Z5181 Encounter for therapeutic drug level monitoring: Principal | ICD-10-CM

## 2019-07-17 DIAGNOSIS — E612 Magnesium deficiency: Principal | ICD-10-CM

## 2019-07-18 DIAGNOSIS — E612 Magnesium deficiency: Secondary | ICD-10-CM | POA: Diagnosis not present

## 2019-07-18 DIAGNOSIS — Z5181 Encounter for therapeutic drug level monitoring: Secondary | ICD-10-CM | POA: Diagnosis not present

## 2019-07-18 DIAGNOSIS — Z944 Liver transplant status: Secondary | ICD-10-CM | POA: Diagnosis not present

## 2019-07-18 LAB — CBC W/ DIFFERENTIAL
BANDED NEUTROPHILS ABSOLUTE COUNT: 0 10*3/uL (ref 0.0–0.1)
BASOPHILS RELATIVE PERCENT: 1 %
EOSINOPHILS RELATIVE PERCENT: 4 %
HEMOGLOBIN: 13.7 g/dL (ref 13.0–17.7)
IMMATURE GRANULOCYTES: 0 %
LYMPHOCYTES ABSOLUTE COUNT: 2 10*3/uL (ref 0.7–3.1)
LYMPHOCYTES RELATIVE PERCENT: 22 %
MEAN CORPUSCULAR HEMOGLOBIN CONC: 33.1 g/dL (ref 31.5–35.7)
MEAN CORPUSCULAR HEMOGLOBIN: 27.8 pg (ref 26.6–33.0)
MEAN CORPUSCULAR VOLUME: 84 fL (ref 79–97)
MONOCYTES ABSOLUTE COUNT: 1 10*3/uL — ABNORMAL HIGH (ref 0.1–0.9)
MONOCYTES RELATIVE PERCENT: 11 %
NEUTROPHILS ABSOLUTE COUNT: 5.7 10*3/uL (ref 1.4–7.0)
NEUTROPHILS RELATIVE PERCENT: 62 %
PLATELET COUNT: 226 10*3/uL (ref 150–450)
RED BLOOD CELL COUNT: 4.92 x10E6/uL (ref 4.14–5.80)
RED CELL DISTRIBUTION WIDTH: 13.8 % (ref 11.6–15.4)
WHITE BLOOD CELL COUNT: 9.2 10*3/uL (ref 3.4–10.8)

## 2019-07-18 LAB — MEAN CORPUSCULAR VOLUME: Erythrocyte mean corpuscular volume:EntVol:Pt:RBC:Qn:Automated count: 84

## 2019-07-19 LAB — COMPREHENSIVE METABOLIC PANEL
A/G RATIO: 2.5 — ABNORMAL HIGH (ref 1.2–2.2)
ALBUMIN: 5 g/dL — ABNORMAL HIGH (ref 3.8–4.9)
ALKALINE PHOSPHATASE: 123 IU/L — ABNORMAL HIGH (ref 48–121)
AST (SGOT): 24 IU/L (ref 0–40)
BILIRUBIN TOTAL: 0.5 mg/dL (ref 0.0–1.2)
BLOOD UREA NITROGEN: 25 mg/dL — ABNORMAL HIGH (ref 6–24)
BUN / CREAT RATIO: 22 — ABNORMAL HIGH (ref 9–20)
CALCIUM: 9.7 mg/dL (ref 8.7–10.2)
CHLORIDE: 104 mmol/L (ref 96–106)
CREATININE: 1.15 mg/dL (ref 0.76–1.27)
GFR MDRD AF AMER: 81 mL/min/{1.73_m2}
GFR MDRD NON AF AMER: 70 mL/min/{1.73_m2}
GLOBULIN, TOTAL: 2 g/dL (ref 1.5–4.5)
GLUCOSE: 114 mg/dL — ABNORMAL HIGH (ref 65–99)
POTASSIUM: 5.2 mmol/L (ref 3.5–5.2)
SODIUM: 139 mmol/L (ref 134–144)
TOTAL PROTEIN: 7 g/dL (ref 6.0–8.5)

## 2019-07-19 LAB — GAMMA GLUTAMYL TRANSFERASE: Gamma glutamyl transferase:CCnc:Pt:Ser/Plas:Qn:: 35

## 2019-07-19 LAB — ALBUMIN: Albumin:MCnc:Pt:Ser/Plas:Qn:: 5 — ABNORMAL HIGH

## 2019-07-19 LAB — BILIRUBIN DIRECT: Bilirubin.glucuronidated+Bilirubin.albumin bound:MCnc:Pt:Ser/Plas:Qn:: 0.15

## 2019-07-19 LAB — PHOSPHORUS, SERUM: Phosphate:MCnc:Pt:Ser/Plas:Qn:: 2.9

## 2019-07-19 LAB — MAGNESIUM: Magnesium:MCnc:Pt:Ser/Plas:Qn:: 1.8

## 2019-07-20 DIAGNOSIS — Z944 Liver transplant status: Principal | ICD-10-CM

## 2019-07-20 LAB — TACROLIMUS BLOOD: Tacrolimus:MCnc:Pt:Bld:Qn:LC/MS/MS: 9.3

## 2019-07-20 MED ORDER — ENVARSUS XR 1 MG TABLET,EXTENDED RELEASE
ORAL_TABLET | Freq: Every day | ORAL | 11 refills | 30 days | Status: CP
Start: 2019-07-20 — End: ?

## 2019-07-20 MED ORDER — TACROLIMUS XR 0.75 MG TABLET,EXTENDED RELEASE 24 HR
ORAL_TABLET | Freq: Every day | ORAL | 11 refills | 30 days | Status: CP
Start: 2019-07-20 — End: ?
  Filled 2019-07-25: qty 30, 30d supply, fill #0

## 2019-07-20 NOTE — Unmapped (Signed)
Patient's tac level above goal after Envarsus dose adjustment on 5/17. Reviewed with NP Martin-Velez who recommended decreasing his dose to 1.75mg  daily. Spoke to patient who had supply of 0.75mg  tabs and relayed dose change. He verbalized understanding and agreed to repeat labs weekly x2 for drug monitoring.

## 2019-07-21 NOTE — Unmapped (Signed)
Change in Envarsus dosage decrease. Co-pay $0.00.

## 2019-07-21 NOTE — Unmapped (Signed)
5/28: speaking with patient today re: dose change envarsus, adding 0.75mg  tablets back -Massie Maroon    [facility] Clinical Assessment & Refill Coordination Note    Anthony Alvarado, DOB: 1960-10-08  Phone: [phone] (home)     All above HIPAA information was verified with patient.     Was a Nurse, learning disability used for this call? No    Specialty Medication(s):   Transplant: Envarsus 1mg , Envarsus 0.75mg  and  mycophenolic acid 180mg      Current Outpatient Medications   Medication Sig Dispense Refill   ??? acetaminophen (TYLENOL) 325 MG tablet Take 2 tablets (650 mg total) by mouth every six (6) hours as needed for pain or fever (> 38C or 100.55F). 100 tablet 0   ??? alendronate (FOSAMAX) 70 MG tablet Take 1 tablet (70 mg total) by mouth every seven (7) days. 4 tablet 11   ??? amLODIPine (NORVASC) 10 MG tablet Take 1 tablet (10 mg total) by mouth daily. 30 tablet 11   ??? aspirin (ECOTRIN) 81 MG tablet Take 1 tablet (81 mg total) by mouth daily. 30 tablet 11   ??? azelastine (ASTELIN) 137 mcg (0.1 %) nasal spray 1 spray by Each Nare route daily as needed.      ??? blood sugar diagnostic Strp Use as directed Three (3) times a day before meals. 90 each 11   ??? calcium carbonate (TUMS) 200 mg calcium (500 mg) chewable tablet Chew 1 tablet Three (3) times a day as needed for heartburn.      ??? cholecalciferol, vitamin D3, 1,000 unit (25 mcg) tablet Take 2 tablets (2,000 Units total) by mouth daily. 60 tablet 11   ??? ENVARSUS XR 1 mg Tb24 extended release tablet Take 1 tablet (1 mg total) by mouth daily with 1 (0.75 mg) tablet for a total dose of 1.75 mg daily 30 tablet 11   ??? fluticasone propionate (FLONASE) 50 mcg/actuation nasal spray PLACE ONE OR TWO SPRAYS INTO BOTH NOSTRILS DAILY AS NEEDED.     ??? lancets 33 gauge Misc 1 each by Miscellaneous route Three (3) times a day before meals. 100 each 11   ??? levothyroxine (SYNTHROID) 150 MCG tablet Take 1 tablet (150 mcg total) by mouth daily. 30 tablet 11   ??? metFORMIN (GLUCOPHAGE XR) 500 MG 24 hr tablet Take 1 tablet (500 mg total) by mouth two (2) times a day. 60 tablet 11   ??? mycophenolate (MYFORTIC) 180 MG EC tablet Take 1 tablet (180 mg total) by mouth Two (2) times a day. 60 tablet 11   ??? pravastatin (PRAVACHOL) 40 MG tablet Take 1 tablet (40 mg total) by mouth every evening. 90 tablet 3   ??? sertraline (ZOLOFT) 25 MG tablet Take 1 tablet (25 mg total) by mouth daily. 30 tablet 11   ??? ENVARSUS XR 0.75 mg Tb24 extended release tablet Take 1 tablet (0.75 mg total) by mouth daily with 1 (1 mg) tablet for a total dose of 1.75 mg daily. 30 tablet 11   ??? testosterone (ANDROGEL) 1 % (25 mg/2.5gram) GlPk Apply 5g (2 packet) to each shoulder/upper arm daily for a total daily dose of 10g. 120 packet 3     No current facility-administered medications for this visit.     Facility-Administered Medications Ordered in Other Visits   Medication Dose Route Frequency Provider Last Rate Last Admin   ??? diazePAM (VALIUM) 5 mg/mL injection  Changes to medications: envarsus now 1.75mg  daily    Allergies   Allergen Reactions   ??? Watermelon Flavor      Mouth itching       Changes to allergies: No    SPECIALTY MEDICATION ADHERENCE     envarsus 1mg   : 8 days of medicine on hand   envarsus 0.75mg   : 8 days of medicine on hand   Mycophenolate 180mg   : 30 days of medicine on hand     Medication Adherence    Patient reported X missed doses in the last month: 0  Specialty Medication: mycophenolate 180mg   Patient is on additional specialty medications: Yes  Additional Specialty Medications: envarsus 1mg   Patient Reported Additional Medication X Missed Doses in the Last Month: 0  Patient is on more than two specialty medications: Yes  Specialty Medication: envarsus 0.75mg   Patient Reported Additional Medication X Missed Doses in the Last Month: 0          Specialty medication(s) dose(s) confirmed: Patient reports changes to the regimen as follows: envarsus is now 1.75mg  daily     Are there any concerns with adherence? No    Adherence counseling provided? Not needed    CLINICAL MANAGEMENT AND INTERVENTION      Clinical Benefit Assessment:    Do you feel the medicine is effective or helping your condition? Yes    Clinical Benefit counseling provided? Not needed    Adverse Effects Assessment:    Are you experiencing any side effects? No    Are you experiencing difficulty administering your medicine? No    Quality of Life Assessment:    How many days over the past month did your transplant  keep you from your normal activities? For example, brushing your teeth or getting up in the morning. 0    Have you discussed this with your provider? Not needed    Therapy Appropriateness:    Is therapy appropriate? Yes, therapy is appropriate and should be continued    DISEASE/MEDICATION-SPECIFIC INFORMATION      N/A    PATIENT SPECIFIC NEEDS     - Does the patient have any physical, cognitive, or cultural barriers? No    - Is the patient high risk? Yes, patient is taking a REMS drug. Medication is dispensed in compliance with REMS program.     - Does the patient require a Care Management Plan? No     - Does the patient require physician intervention or other additional services (i.e. nutrition, smoking cessation, social work)? No      SHIPPING     Specialty Medication(s) to be Shipped:   Transplant: Envarsus 1 0.75mg  and Envarsus zoloft, fosamax, norvasc, metformin, pravacholmg    Other medication(s) to be shipped: pt wants call back in 2 weeks on all other meds     Changes to insurance: No    Delivery Scheduled: Yes, Expected medication delivery date: 07/26/2019.     Medication will be delivered via UPS to the confirmed prescription address in [facility].    The patient will receive a drug information handout for each medication shipped and additional FDA Medication Guides as required.  Verified that patient has previously received a Conservation officer, historic buildings.    All of the patient's questions and concerns have been addressed.    [name]   [facility] Specialty Pharmacist

## 2019-07-24 DIAGNOSIS — Z5181 Encounter for therapeutic drug level monitoring: Principal | ICD-10-CM

## 2019-07-24 DIAGNOSIS — Z944 Liver transplant status: Principal | ICD-10-CM

## 2019-07-24 DIAGNOSIS — E612 Magnesium deficiency: Principal | ICD-10-CM

## 2019-07-25 MED FILL — METFORMIN ER 500 MG TABLET,EXTENDED RELEASE 24 HR: ORAL | 30 days supply | Qty: 60 | Fill #1

## 2019-07-25 MED FILL — AMLODIPINE 10 MG TABLET: ORAL | 30 days supply | Qty: 30 | Fill #1

## 2019-07-25 MED FILL — ALENDRONATE 70 MG TABLET: ORAL | 28 days supply | Qty: 4 | Fill #8

## 2019-07-25 MED FILL — PRAVASTATIN 40 MG TABLET: 90 days supply | Qty: 90 | Fill #1 | Status: AC

## 2019-07-25 MED FILL — AMLODIPINE 10 MG TABLET: 30 days supply | Qty: 30 | Fill #1 | Status: AC

## 2019-07-25 MED FILL — ENVARSUS XR 1 MG TABLET,EXTENDED RELEASE: 30 days supply | Qty: 30 | Fill #0 | Status: AC

## 2019-07-25 MED FILL — ENVARSUS XR 0.75 MG TABLET,EXTENDED RELEASE: 30 days supply | Qty: 30 | Fill #0 | Status: AC

## 2019-07-25 MED FILL — PRAVASTATIN 40 MG TABLET: ORAL | 90 days supply | Qty: 90 | Fill #1

## 2019-07-25 MED FILL — METFORMIN ER 500 MG TABLET,EXTENDED RELEASE 24 HR: 30 days supply | Qty: 60 | Fill #1 | Status: AC

## 2019-07-25 MED FILL — ENVARSUS XR 1 MG TABLET,EXTENDED RELEASE: ORAL | 30 days supply | Qty: 30 | Fill #0

## 2019-07-25 MED FILL — ALENDRONATE 70 MG TABLET: 28 days supply | Qty: 4 | Fill #8 | Status: AC

## 2019-07-26 DIAGNOSIS — Z944 Liver transplant status: Secondary | ICD-10-CM | POA: Diagnosis not present

## 2019-07-26 DIAGNOSIS — Z5181 Encounter for therapeutic drug level monitoring: Secondary | ICD-10-CM | POA: Diagnosis not present

## 2019-07-26 DIAGNOSIS — E612 Magnesium deficiency: Secondary | ICD-10-CM | POA: Diagnosis not present

## 2019-07-26 LAB — CBC W/ DIFFERENTIAL
BANDED NEUTROPHILS ABSOLUTE COUNT: 0.1 10*3/uL (ref 0.0–0.1)
BASOPHILS ABSOLUTE COUNT: 0.1 10*3/uL (ref 0.0–0.2)
BASOPHILS RELATIVE PERCENT: 1 %
EOSINOPHILS ABSOLUTE COUNT: 0.3 10*3/uL (ref 0.0–0.4)
EOSINOPHILS RELATIVE PERCENT: 3 %
IMMATURE GRANULOCYTES: 1 %
LYMPHOCYTES ABSOLUTE COUNT: 1.8 10*3/uL (ref 0.7–3.1)
LYMPHOCYTES RELATIVE PERCENT: 21 %
MEAN CORPUSCULAR HEMOGLOBIN CONC: 34.9 g/dL (ref 31.5–35.7)
MEAN CORPUSCULAR HEMOGLOBIN: 28.9 pg (ref 26.6–33.0)
MEAN CORPUSCULAR VOLUME: 83 fL (ref 79–97)
MONOCYTES ABSOLUTE COUNT: 0.9 10*3/uL (ref 0.1–0.9)
MONOCYTES RELATIVE PERCENT: 11 %
NEUTROPHILS ABSOLUTE COUNT: 5.5 10*3/uL (ref 1.4–7.0)
NEUTROPHILS RELATIVE PERCENT: 63 %
PLATELET COUNT: 231 10*3/uL (ref 150–450)
RED BLOOD CELL COUNT: 5.02 x10E6/uL (ref 4.14–5.80)
RED CELL DISTRIBUTION WIDTH: 13.7 % (ref 11.6–15.4)
WHITE BLOOD CELL COUNT: 8.7 10*3/uL (ref 3.4–10.8)

## 2019-07-26 LAB — MEAN CORPUSCULAR HEMOGLOBIN: Erythrocyte mean corpuscular hemoglobin:EntMass:Pt:RBC:Qn:Automated count: 28.9

## 2019-07-27 LAB — COMPREHENSIVE METABOLIC PANEL
A/G RATIO: 2 (ref 1.2–2.2)
ALBUMIN: 4.7 g/dL (ref 3.8–4.9)
ALT (SGPT): 31 IU/L (ref 0–44)
AST (SGOT): 23 IU/L (ref 0–40)
BILIRUBIN TOTAL: 0.2 mg/dL (ref 0.0–1.2)
BLOOD UREA NITROGEN: 26 mg/dL — ABNORMAL HIGH (ref 6–24)
CALCIUM: 10.1 mg/dL (ref 8.7–10.2)
CHLORIDE: 103 mmol/L (ref 96–106)
CO2: 20 mmol/L (ref 20–29)
GFR MDRD AF AMER: 88 mL/min/{1.73_m2}
GFR MDRD NON AF AMER: 76 mL/min/{1.73_m2}
GLOBULIN, TOTAL: 2.4 g/dL (ref 1.5–4.5)
GLUCOSE: 142 mg/dL — ABNORMAL HIGH (ref 65–99)
POTASSIUM: 4.7 mmol/L (ref 3.5–5.2)
SODIUM: 138 mmol/L (ref 134–144)
TOTAL PROTEIN: 7.1 g/dL (ref 6.0–8.5)

## 2019-07-27 LAB — PHOSPHORUS, SERUM: Phosphate:MCnc:Pt:Ser/Plas:Qn:: 4

## 2019-07-27 LAB — BILIRUBIN DIRECT: Bilirubin.glucuronidated+Bilirubin.albumin bound:MCnc:Pt:Ser/Plas:Qn:: 0.08

## 2019-07-27 LAB — CHLORIDE: Chloride:SCnc:Pt:Ser/Plas:Qn:: 103

## 2019-07-27 LAB — GAMMA GLUTAMYL TRANSFERASE: Gamma glutamyl transferase:CCnc:Pt:Ser/Plas:Qn:: 33

## 2019-07-27 LAB — MAGNESIUM: Magnesium:MCnc:Pt:Ser/Plas:Qn:: 1.8

## 2019-07-27 NOTE — Unmapped (Signed)
Patient's 7/26 annual MRI originally ordered and scheduled without sedation. Contacted MRI and rescheduled study with adult sedation.

## 2019-07-28 LAB — TACROLIMUS BLOOD: Tacrolimus:MCnc:Pt:Bld:Qn:LC/MS/MS: 5.5

## 2019-07-31 DIAGNOSIS — Z944 Liver transplant status: Principal | ICD-10-CM

## 2019-07-31 DIAGNOSIS — Z5181 Encounter for therapeutic drug level monitoring: Principal | ICD-10-CM

## 2019-07-31 DIAGNOSIS — E612 Magnesium deficiency: Principal | ICD-10-CM

## 2019-07-31 MED FILL — SERTRALINE 25 MG TABLET: ORAL | 30 days supply | Qty: 30 | Fill #5

## 2019-07-31 MED FILL — SERTRALINE 25 MG TABLET: 30 days supply | Qty: 30 | Fill #5 | Status: AC

## 2019-08-01 DIAGNOSIS — E612 Magnesium deficiency: Secondary | ICD-10-CM | POA: Diagnosis not present

## 2019-08-01 DIAGNOSIS — Z944 Liver transplant status: Secondary | ICD-10-CM | POA: Diagnosis not present

## 2019-08-01 DIAGNOSIS — Z5181 Encounter for therapeutic drug level monitoring: Secondary | ICD-10-CM | POA: Diagnosis not present

## 2019-08-02 LAB — GAMMA GLUTAMYL TRANSFERASE: Gamma glutamyl transferase:CCnc:Pt:Ser/Plas:Qn:: 35

## 2019-08-02 LAB — CBC W/ DIFFERENTIAL
BANDED NEUTROPHILS ABSOLUTE COUNT: 0.1 10*3/uL (ref 0.0–0.1)
BASOPHILS ABSOLUTE COUNT: 0.1 10*3/uL (ref 0.0–0.2)
BASOPHILS RELATIVE PERCENT: 1 %
EOSINOPHILS ABSOLUTE COUNT: 0.3 10*3/uL (ref 0.0–0.4)
EOSINOPHILS RELATIVE PERCENT: 4 %
HEMATOCRIT: 43.3 % (ref 37.5–51.0)
HEMOGLOBIN: 14.7 g/dL (ref 13.0–17.7)
IMMATURE GRANULOCYTES: 1 %
LYMPHOCYTES ABSOLUTE COUNT: 2.1 10*3/uL (ref 0.7–3.1)
MEAN CORPUSCULAR HEMOGLOBIN CONC: 33.9 g/dL (ref 31.5–35.7)
MEAN CORPUSCULAR HEMOGLOBIN: 28.6 pg (ref 26.6–33.0)
MEAN CORPUSCULAR VOLUME: 84 fL (ref 79–97)
NEUTROPHILS ABSOLUTE COUNT: 5.9 10*3/uL (ref 1.4–7.0)
NEUTROPHILS RELATIVE PERCENT: 62 %
PLATELET COUNT: 240 10*3/uL (ref 150–450)
RED BLOOD CELL COUNT: 5.14 x10E6/uL (ref 4.14–5.80)
RED CELL DISTRIBUTION WIDTH: 13.7 % (ref 11.6–15.4)
WHITE BLOOD CELL COUNT: 9.4 10*3/uL (ref 3.4–10.8)

## 2019-08-02 LAB — COMPREHENSIVE METABOLIC PANEL
A/G RATIO: 2 (ref 1.2–2.2)
ALBUMIN: 4.9 g/dL (ref 3.8–4.9)
ALKALINE PHOSPHATASE: 111 IU/L (ref 48–121)
ALT (SGPT): 35 IU/L (ref 0–44)
AST (SGOT): 26 IU/L (ref 0–40)
BILIRUBIN TOTAL: 0.3 mg/dL (ref 0.0–1.2)
BLOOD UREA NITROGEN: 23 mg/dL (ref 6–24)
BUN / CREAT RATIO: 21 — ABNORMAL HIGH (ref 9–20)
CALCIUM: 10.1 mg/dL (ref 8.7–10.2)
CHLORIDE: 102 mmol/L (ref 96–106)
CREATININE: 1.08 mg/dL (ref 0.76–1.27)
GFR MDRD AF AMER: 88 mL/min/{1.73_m2}
GFR MDRD NON AF AMER: 76 mL/min/{1.73_m2}
GLOBULIN, TOTAL: 2.5 g/dL (ref 1.5–4.5)
POTASSIUM: 4.7 mmol/L (ref 3.5–5.2)
TOTAL PROTEIN: 7.4 g/dL (ref 6.0–8.5)

## 2019-08-02 LAB — BILIRUBIN DIRECT: Bilirubin.glucuronidated+Bilirubin.albumin bound:MCnc:Pt:Ser/Plas:Qn:: 0.1

## 2019-08-02 LAB — AST (SGOT): Aspartate aminotransferase:CCnc:Pt:Ser/Plas:Qn:: 26

## 2019-08-02 LAB — PHOSPHORUS, SERUM: Phosphate:MCnc:Pt:Ser/Plas:Qn:: 4.3 — ABNORMAL HIGH

## 2019-08-02 LAB — MAGNESIUM: Magnesium:MCnc:Pt:Ser/Plas:Qn:: 1.8

## 2019-08-02 LAB — BASOPHILS RELATIVE PERCENT: Basophils/100 leukocytes:NFr:Pt:Bld:Qn:Automated count: 1

## 2019-08-03 LAB — TACROLIMUS BLOOD: Tacrolimus:MCnc:Pt:Bld:Qn:LC/MS/MS: 5.1

## 2019-08-04 NOTE — Unmapped (Signed)
Patient's last two tac levels, including 6/7, were subpar after decreasing envarsus dose to 1.75mg . Reviewed with NP Martin-Velez who recommended no changes d/t proximity to his 1 yr anniversary and his hx of HCC.

## 2019-08-07 DIAGNOSIS — E612 Magnesium deficiency: Principal | ICD-10-CM

## 2019-08-07 DIAGNOSIS — Z944 Liver transplant status: Principal | ICD-10-CM

## 2019-08-07 DIAGNOSIS — Z5181 Encounter for therapeutic drug level monitoring: Principal | ICD-10-CM

## 2019-08-09 DIAGNOSIS — E612 Magnesium deficiency: Secondary | ICD-10-CM | POA: Diagnosis not present

## 2019-08-09 DIAGNOSIS — Z5181 Encounter for therapeutic drug level monitoring: Secondary | ICD-10-CM | POA: Diagnosis not present

## 2019-08-09 DIAGNOSIS — Z944 Liver transplant status: Secondary | ICD-10-CM | POA: Diagnosis not present

## 2019-08-10 LAB — CBC W/ DIFFERENTIAL
BANDED NEUTROPHILS ABSOLUTE COUNT: 0.1 10*3/uL (ref 0.0–0.1)
BASOPHILS ABSOLUTE COUNT: 0.1 10*3/uL (ref 0.0–0.2)
BASOPHILS RELATIVE PERCENT: 1 %
EOSINOPHILS ABSOLUTE COUNT: 0.4 10*3/uL (ref 0.0–0.4)
EOSINOPHILS RELATIVE PERCENT: 4 %
HEMATOCRIT: 44.2 % (ref 37.5–51.0)
HEMOGLOBIN: 14.7 g/dL (ref 13.0–17.7)
IMMATURE GRANULOCYTES: 1 %
LYMPHOCYTES ABSOLUTE COUNT: 2.2 10*3/uL (ref 0.7–3.1)
LYMPHOCYTES RELATIVE PERCENT: 21 %
MEAN CORPUSCULAR HEMOGLOBIN CONC: 33.3 g/dL (ref 31.5–35.7)
MEAN CORPUSCULAR HEMOGLOBIN: 28.4 pg (ref 26.6–33.0)
MEAN CORPUSCULAR VOLUME: 85 fL (ref 79–97)
MONOCYTES ABSOLUTE COUNT: 1.1 10*3/uL — ABNORMAL HIGH (ref 0.1–0.9)
MONOCYTES RELATIVE PERCENT: 10 %
NEUTROPHILS ABSOLUTE COUNT: 6.5 10*3/uL (ref 1.4–7.0)
RED BLOOD CELL COUNT: 5.18 x10E6/uL (ref 4.14–5.80)
RED CELL DISTRIBUTION WIDTH: 13.6 % (ref 11.6–15.4)

## 2019-08-10 LAB — COMPREHENSIVE METABOLIC PANEL
A/G RATIO: 2.1 (ref 1.2–2.2)
ALBUMIN: 4.9 g/dL (ref 3.8–4.9)
ALKALINE PHOSPHATASE: 113 IU/L (ref 48–121)
ALT (SGPT): 34 IU/L (ref 0–44)
BILIRUBIN TOTAL: 0.4 mg/dL (ref 0.0–1.2)
BLOOD UREA NITROGEN: 20 mg/dL (ref 6–24)
BUN / CREAT RATIO: 19 (ref 9–20)
CALCIUM: 9.6 mg/dL (ref 8.7–10.2)
CHLORIDE: 103 mmol/L (ref 96–106)
CO2: 21 mmol/L (ref 20–29)
GFR MDRD AF AMER: 88 mL/min/{1.73_m2}
GFR MDRD NON AF AMER: 76 mL/min/{1.73_m2}
GLOBULIN, TOTAL: 2.3 g/dL (ref 1.5–4.5)
GLUCOSE: 130 mg/dL — ABNORMAL HIGH (ref 65–99)
POTASSIUM: 4.8 mmol/L (ref 3.5–5.2)
SODIUM: 139 mmol/L (ref 134–144)
TOTAL PROTEIN: 7.2 g/dL (ref 6.0–8.5)

## 2019-08-10 LAB — AST (SGOT): Aspartate aminotransferase:CCnc:Pt:Ser/Plas:Qn:: 25

## 2019-08-10 LAB — MAGNESIUM: Magnesium:MCnc:Pt:Ser/Plas:Qn:: 1.7

## 2019-08-10 LAB — BILIRUBIN DIRECT: Bilirubin.glucuronidated+Bilirubin.albumin bound:MCnc:Pt:Ser/Plas:Qn:: 0.11

## 2019-08-10 LAB — GAMMA GLUTAMYL TRANSFERASE: Gamma glutamyl transferase:CCnc:Pt:Ser/Plas:Qn:: 36

## 2019-08-10 LAB — PHOSPHORUS, SERUM: Phosphate:MCnc:Pt:Ser/Plas:Qn:: 3

## 2019-08-10 LAB — MONOCYTES RELATIVE PERCENT: Monocytes/100 leukocytes:NFr:Pt:Bld:Qn:Automated count: 10

## 2019-08-10 NOTE — Unmapped (Signed)
Patient repeated labs again on 6/16, which were stable. Called him and reminded him that he can assumed a two week interval for labs. He verbalized understanding.

## 2019-08-11 LAB — TACROLIMUS BLOOD: Tacrolimus:MCnc:Pt:Bld:Qn:LC/MS/MS: 5.4

## 2019-08-11 NOTE — Unmapped (Unsigned)
The [facility] has made a third and final attempt to reach this patient to refill the following medication: mycophenolate.      We have left voicemails on the following phone numbers: [phone].    Dates contacted: 06/11, 06/14, 06/18  Last scheduled delivery: 07/07/2019    The patient may be at risk of non-compliance with this medication. The patient should call the [facility] at [phone] (option 4) to refill medication.    [name]   [facility] Specialty Technician

## 2019-08-14 DIAGNOSIS — Z944 Liver transplant status: Principal | ICD-10-CM

## 2019-08-14 DIAGNOSIS — Z5181 Encounter for therapeutic drug level monitoring: Principal | ICD-10-CM

## 2019-08-14 DIAGNOSIS — E612 Magnesium deficiency: Principal | ICD-10-CM

## 2019-08-21 DIAGNOSIS — Z5181 Encounter for therapeutic drug level monitoring: Principal | ICD-10-CM

## 2019-08-21 DIAGNOSIS — Z944 Liver transplant status: Principal | ICD-10-CM

## 2019-08-21 DIAGNOSIS — E612 Magnesium deficiency: Principal | ICD-10-CM

## 2019-08-22 NOTE — Unmapped (Signed)
[facility] Refill Coordination Note    Specialty Medication(s) to be Shipped:   Transplant: Envarsus 0.75mg  and Envarsus 1mg     Other medication(s) to be shipped: alendronate 70mg , amlodipine 10mg , metformin 500mg , sertraline 25mg      **Patient denied refills for Mycophenolate at this time**    [name], DOB: 09/01/1960  Phone: [phone] (home)       All above HIPAA information was verified with patient.     Was a Nurse, learning disability used for this call? No    Completed refill call assessment today to schedule patient's medication shipment from the [facility] [phone]).       Specialty medication(s) and dose(s) confirmed: Regimen is correct and unchanged.   Changes to medications: [name] reports no changes at this time.  Changes to insurance: No  Questions for the pharmacist: No    Confirmed patient received Welcome Packet with first shipment. The patient will receive a drug information handout for each medication shipped and additional FDA Medication Guides as required.       DISEASE/MEDICATION-SPECIFIC INFORMATION        N/A    SPECIALTY MEDICATION ADHERENCE     Medication Adherence    Patient reported X missed doses in the last month: 0  Specialty Medication: Envarsus Xr 0.75mg   Patient is on additional specialty medications: Yes  Additional Specialty Medications: Envarsus Xr 1mg   Patient Reported Additional Medication X Missed Doses in the Last Month: 0  Patient is on more than two specialty medications: No  Informant: patient                Envarsus 0.75 mg: 10 days of medicine on hand   Envarsus 1 mg: 10 days of medicine on hand         SHIPPING     Shipping address confirmed in Epic.     Delivery Scheduled: Yes, Expected medication delivery date: 08/30/19.     Medication will be delivered via UPS to the prescription address in Epic WAM.    Jasper Loser   [facility] Specialty Technician

## 2019-08-23 DIAGNOSIS — Z5181 Encounter for therapeutic drug level monitoring: Secondary | ICD-10-CM | POA: Diagnosis not present

## 2019-08-23 DIAGNOSIS — Z944 Liver transplant status: Secondary | ICD-10-CM | POA: Diagnosis not present

## 2019-08-23 DIAGNOSIS — E612 Magnesium deficiency: Secondary | ICD-10-CM | POA: Diagnosis not present

## 2019-08-24 DIAGNOSIS — G4733 Obstructive sleep apnea (adult) (pediatric): Secondary | ICD-10-CM | POA: Diagnosis not present

## 2019-08-24 LAB — COMPREHENSIVE METABOLIC PANEL
A/G RATIO: 2.2 (ref 1.2–2.2)
ALBUMIN: 4.8 g/dL (ref 3.8–4.9)
ALT (SGPT): 37 IU/L (ref 0–44)
AST (SGOT): 26 IU/L (ref 0–40)
BILIRUBIN TOTAL: 0.4 mg/dL (ref 0.0–1.2)
BUN / CREAT RATIO: 22 — ABNORMAL HIGH (ref 9–20)
CALCIUM: 9.8 mg/dL (ref 8.7–10.2)
CO2: 23 mmol/L (ref 20–29)
CREATININE: 1.05 mg/dL (ref 0.76–1.27)
GFR MDRD AF AMER: 91 mL/min/{1.73_m2}
GFR MDRD NON AF AMER: 78 mL/min/{1.73_m2}
GLOBULIN, TOTAL: 2.2 g/dL (ref 1.5–4.5)
GLUCOSE: 123 mg/dL — ABNORMAL HIGH (ref 65–99)
POTASSIUM: 5.2 mmol/L (ref 3.5–5.2)
SODIUM: 140 mmol/L (ref 134–144)
TOTAL PROTEIN: 7 g/dL (ref 6.0–8.5)

## 2019-08-24 LAB — CBC W/ DIFFERENTIAL
BANDED NEUTROPHILS ABSOLUTE COUNT: 0.1 10*3/uL (ref 0.0–0.1)
BASOPHILS ABSOLUTE COUNT: 0.1 10*3/uL (ref 0.0–0.2)
BASOPHILS RELATIVE PERCENT: 1 %
EOSINOPHILS ABSOLUTE COUNT: 0.3 10*3/uL (ref 0.0–0.4)
EOSINOPHILS RELATIVE PERCENT: 4 %
HEMATOCRIT: 43.8 % (ref 37.5–51.0)
HEMOGLOBIN: 14.3 g/dL (ref 13.0–17.7)
LYMPHOCYTES ABSOLUTE COUNT: 2.1 10*3/uL (ref 0.7–3.1)
LYMPHOCYTES RELATIVE PERCENT: 22 %
MEAN CORPUSCULAR HEMOGLOBIN CONC: 32.6 g/dL (ref 31.5–35.7)
MEAN CORPUSCULAR HEMOGLOBIN: 28.3 pg (ref 26.6–33.0)
MEAN CORPUSCULAR VOLUME: 87 fL (ref 79–97)
MONOCYTES ABSOLUTE COUNT: 0.9 10*3/uL (ref 0.1–0.9)
NEUTROPHILS ABSOLUTE COUNT: 6.1 10*3/uL (ref 1.4–7.0)
NEUTROPHILS RELATIVE PERCENT: 63 %
PLATELET COUNT: 204 10*3/uL (ref 150–450)
RED BLOOD CELL COUNT: 5.06 x10E6/uL (ref 4.14–5.80)
RED CELL DISTRIBUTION WIDTH: 13.6 % (ref 11.6–15.4)
WHITE BLOOD CELL COUNT: 9.5 10*3/uL (ref 3.4–10.8)

## 2019-08-24 LAB — BILIRUBIN DIRECT: Bilirubin.glucuronidated+Bilirubin.albumin bound:MCnc:Pt:Ser/Plas:Qn:: 0.1

## 2019-08-24 LAB — GAMMA GLUTAMYL TRANSFERASE: Gamma glutamyl transferase:CCnc:Pt:Ser/Plas:Qn:: 36

## 2019-08-24 LAB — HEMOGLOBIN: Hemoglobin:MCnc:Pt:Bld:Qn:: 14.3

## 2019-08-24 LAB — MAGNESIUM: Magnesium:MCnc:Pt:Ser/Plas:Qn:: 1.8

## 2019-08-24 LAB — TOTAL PROTEIN: Protein:MCnc:Pt:Ser/Plas:Qn:: 7

## 2019-08-24 LAB — PHOSPHORUS, SERUM: Phosphate:MCnc:Pt:Ser/Plas:Qn:: 3.4

## 2019-08-26 LAB — TACROLIMUS BLOOD: Tacrolimus:MCnc:Pt:Bld:Qn:LC/MS/MS: 4.2

## 2019-08-28 DIAGNOSIS — Z944 Liver transplant status: Principal | ICD-10-CM

## 2019-08-28 DIAGNOSIS — Z5181 Encounter for therapeutic drug level monitoring: Principal | ICD-10-CM

## 2019-08-28 DIAGNOSIS — E612 Magnesium deficiency: Principal | ICD-10-CM

## 2019-08-30 MED FILL — METFORMIN ER 500 MG TABLET,EXTENDED RELEASE 24 HR: ORAL | 30 days supply | Qty: 60 | Fill #2

## 2019-08-30 MED FILL — ENVARSUS XR 0.75 MG TABLET,EXTENDED RELEASE: 30 days supply | Qty: 30 | Fill #1 | Status: AC

## 2019-08-30 MED FILL — ENVARSUS XR 0.75 MG TABLET,EXTENDED RELEASE: ORAL | 30 days supply | Qty: 30 | Fill #1

## 2019-08-30 MED FILL — ENVARSUS XR 1 MG TABLET,EXTENDED RELEASE: 30 days supply | Qty: 30 | Fill #1 | Status: AC

## 2019-08-30 MED FILL — SERTRALINE 25 MG TABLET: ORAL | 30 days supply | Qty: 30 | Fill #6

## 2019-08-30 MED FILL — AMLODIPINE 10 MG TABLET: 30 days supply | Qty: 30 | Fill #2 | Status: AC

## 2019-08-30 MED FILL — ALENDRONATE 70 MG TABLET: ORAL | 28 days supply | Qty: 4 | Fill #9

## 2019-08-30 MED FILL — AMLODIPINE 10 MG TABLET: ORAL | 30 days supply | Qty: 30 | Fill #2

## 2019-08-30 MED FILL — SERTRALINE 25 MG TABLET: 30 days supply | Qty: 30 | Fill #6 | Status: AC

## 2019-08-30 MED FILL — ALENDRONATE 70 MG TABLET: 28 days supply | Qty: 4 | Fill #9 | Status: AC

## 2019-08-30 MED FILL — METFORMIN ER 500 MG TABLET,EXTENDED RELEASE 24 HR: 30 days supply | Qty: 60 | Fill #2 | Status: AC

## 2019-08-30 MED FILL — ENVARSUS XR 1 MG TABLET,EXTENDED RELEASE: ORAL | 30 days supply | Qty: 30 | Fill #1

## 2019-08-31 ENCOUNTER — Ambulatory Visit: Payer: BC Managed Care – PPO | Attending: Internal Medicine

## 2019-08-31 DIAGNOSIS — Z23 Encounter for immunization: Secondary | ICD-10-CM

## 2019-08-31 NOTE — Progress Notes (Signed)
   Covid-19 Vaccination Clinic  Name:  [name]    MRN: [id number] DOB: 11-16-1960  08/31/2019  [name] was observed post Covid-19 immunization for 30 minutes based on pre-vaccination screening without incident. He was provided with Vaccine Information Sheet and instruction to access the V-Safe system.   [name] was instructed to call 911 with any severe reactions post vaccine: [name] Difficulty breathing  . Swelling of face and throat  . A fast heartbeat  . A bad rash all over body  . Dizziness and weakness   Immunizations Administered    Name Date Dose VIS Date Route   Pfizer COVID-19 Vaccine 08/31/2019  8:51 AM 0.3 mL 04/19/2018 Intramuscular   Manufacturer: [employer], [employer]   Lot: [id number]   [place]: [id number]

## 2019-09-04 DIAGNOSIS — Z944 Liver transplant status: Principal | ICD-10-CM

## 2019-09-04 DIAGNOSIS — E612 Magnesium deficiency: Principal | ICD-10-CM

## 2019-09-04 DIAGNOSIS — Z5181 Encounter for therapeutic drug level monitoring: Principal | ICD-10-CM

## 2019-09-06 DIAGNOSIS — Z944 Liver transplant status: Secondary | ICD-10-CM | POA: Diagnosis not present

## 2019-09-06 DIAGNOSIS — E612 Magnesium deficiency: Secondary | ICD-10-CM | POA: Diagnosis not present

## 2019-09-06 DIAGNOSIS — Z5181 Encounter for therapeutic drug level monitoring: Secondary | ICD-10-CM | POA: Diagnosis not present

## 2019-09-07 LAB — COMPREHENSIVE METABOLIC PANEL
A/G RATIO: 2.1 (ref 1.2–2.2)
ALBUMIN: 4.9 g/dL (ref 3.8–4.9)
ALKALINE PHOSPHATASE: 95 IU/L (ref 48–121)
ALT (SGPT): 36 IU/L (ref 0–44)
BILIRUBIN TOTAL: 0.3 mg/dL (ref 0.0–1.2)
BLOOD UREA NITROGEN: 29 mg/dL — ABNORMAL HIGH (ref 6–24)
BUN / CREAT RATIO: 28 — ABNORMAL HIGH (ref 9–20)
CALCIUM: 8.8 mg/dL (ref 8.7–10.2)
CHLORIDE: 102 mmol/L (ref 96–106)
CO2: 21 mmol/L (ref 20–29)
CREATININE: 1.04 mg/dL (ref 0.76–1.27)
GFR MDRD AF AMER: 92 mL/min/{1.73_m2}
GLOBULIN, TOTAL: 2.3 g/dL (ref 1.5–4.5)
POTASSIUM: 5.2 mmol/L (ref 3.5–5.2)
SODIUM: 135 mmol/L (ref 134–144)
TOTAL PROTEIN: 7.2 g/dL (ref 6.0–8.5)

## 2019-09-07 LAB — GAMMA GLUTAMYL TRANSFERASE: Gamma glutamyl transferase:CCnc:Pt:Ser/Plas:Qn:: 39

## 2019-09-07 LAB — CBC W/ DIFFERENTIAL
BANDED NEUTROPHILS ABSOLUTE COUNT: 0.1 10*3/uL (ref 0.0–0.1)
BASOPHILS RELATIVE PERCENT: 1 %
EOSINOPHILS ABSOLUTE COUNT: 0.4 10*3/uL (ref 0.0–0.4)
EOSINOPHILS RELATIVE PERCENT: 4 %
HEMATOCRIT: 43.8 % (ref 37.5–51.0)
HEMOGLOBIN: 14.4 g/dL (ref 13.0–17.7)
IMMATURE GRANULOCYTES: 1 %
LYMPHOCYTES ABSOLUTE COUNT: 2.3 10*3/uL (ref 0.7–3.1)
LYMPHOCYTES RELATIVE PERCENT: 22 %
MEAN CORPUSCULAR HEMOGLOBIN CONC: 32.9 g/dL (ref 31.5–35.7)
MEAN CORPUSCULAR HEMOGLOBIN: 28.6 pg (ref 26.6–33.0)
MEAN CORPUSCULAR VOLUME: 87 fL (ref 79–97)
MONOCYTES ABSOLUTE COUNT: 1 10*3/uL — ABNORMAL HIGH (ref 0.1–0.9)
MONOCYTES RELATIVE PERCENT: 10 %
NEUTROPHILS ABSOLUTE COUNT: 6.5 10*3/uL (ref 1.4–7.0)
NEUTROPHILS RELATIVE PERCENT: 62 %
PLATELET COUNT: 215 10*3/uL (ref 150–450)
RED CELL DISTRIBUTION WIDTH: 13.7 % (ref 11.6–15.4)
WHITE BLOOD CELL COUNT: 10.4 10*3/uL (ref 3.4–10.8)

## 2019-09-07 LAB — GFR MDRD NON AF AMER
Glomerular filtration rate/1.73 sq M.predicted.non black:ArVRat:Pt:Ser/Plas/Bld:Qn:Creatinine-based formula (CKD-EPI): 79

## 2019-09-07 LAB — HEMOGLOBIN: Hemoglobin:MCnc:Pt:Bld:Qn:: 14.4

## 2019-09-07 LAB — PHOSPHORUS, SERUM: Phosphate:MCnc:Pt:Ser/Plas:Qn:: 2.7 — ABNORMAL LOW

## 2019-09-07 LAB — BILIRUBIN DIRECT: Bilirubin.glucuronidated+Bilirubin.albumin bound:MCnc:Pt:Ser/Plas:Qn:: 0.09

## 2019-09-07 LAB — MAGNESIUM: Magnesium:MCnc:Pt:Ser/Plas:Qn:: 2.1

## 2019-09-08 LAB — TACROLIMUS BLOOD: Tacrolimus:MCnc:Pt:Bld:Qn:LC/MS/MS: 5.9

## 2019-09-11 DIAGNOSIS — E612 Magnesium deficiency: Principal | ICD-10-CM

## 2019-09-11 DIAGNOSIS — Z944 Liver transplant status: Principal | ICD-10-CM

## 2019-09-11 DIAGNOSIS — Z5181 Encounter for therapeutic drug level monitoring: Principal | ICD-10-CM

## 2019-09-14 DIAGNOSIS — E559 Vitamin D deficiency, unspecified: Principal | ICD-10-CM

## 2019-09-14 DIAGNOSIS — C220 Liver cell carcinoma: Principal | ICD-10-CM

## 2019-09-14 DIAGNOSIS — Z944 Liver transplant status: Principal | ICD-10-CM

## 2019-09-14 DIAGNOSIS — E612 Magnesium deficiency: Principal | ICD-10-CM

## 2019-09-14 DIAGNOSIS — B259 Cytomegaloviral disease, unspecified: Principal | ICD-10-CM

## 2019-09-14 DIAGNOSIS — Z5181 Encounter for therapeutic drug level monitoring: Principal | ICD-10-CM

## 2019-09-14 DIAGNOSIS — R739 Hyperglycemia, unspecified: Principal | ICD-10-CM

## 2019-09-18 ENCOUNTER — Ambulatory Visit: Admit: 2019-09-18 | Discharge: 2019-09-18 | Payer: MEDICARE

## 2019-09-18 ENCOUNTER — Encounter: Admit: 2019-09-18 | Discharge: 2019-09-18 | Payer: MEDICARE

## 2019-09-18 ENCOUNTER — Encounter: Admit: 2019-09-18 | Discharge: 2019-09-18 | Payer: MEDICARE | Attending: Nutritionist | Primary: Nutritionist

## 2019-09-18 DIAGNOSIS — R7989 Other specified abnormal findings of blood chemistry: Principal | ICD-10-CM

## 2019-09-18 DIAGNOSIS — Z5181 Encounter for therapeutic drug level monitoring: Principal | ICD-10-CM

## 2019-09-18 DIAGNOSIS — Z944 Liver transplant status: Principal | ICD-10-CM

## 2019-09-18 DIAGNOSIS — E612 Magnesium deficiency: Principal | ICD-10-CM

## 2019-09-18 DIAGNOSIS — E119 Type 2 diabetes mellitus without complications: Secondary | ICD-10-CM | POA: Diagnosis not present

## 2019-09-18 DIAGNOSIS — M8588 Other specified disorders of bone density and structure, other site: Secondary | ICD-10-CM | POA: Diagnosis not present

## 2019-09-18 DIAGNOSIS — Z7952 Long term (current) use of systemic steroids: Secondary | ICD-10-CM | POA: Diagnosis not present

## 2019-09-18 DIAGNOSIS — E669 Obesity, unspecified: Secondary | ICD-10-CM | POA: Diagnosis not present

## 2019-09-18 DIAGNOSIS — Z8505 Personal history of malignant neoplasm of liver: Secondary | ICD-10-CM | POA: Diagnosis not present

## 2019-09-18 DIAGNOSIS — R0600 Dyspnea, unspecified: Secondary | ICD-10-CM | POA: Diagnosis not present

## 2019-09-18 DIAGNOSIS — Z7983 Long term (current) use of bisphosphonates: Secondary | ICD-10-CM | POA: Diagnosis not present

## 2019-09-18 DIAGNOSIS — Z4823 Encounter for aftercare following liver transplant: Secondary | ICD-10-CM | POA: Diagnosis not present

## 2019-09-18 DIAGNOSIS — R61 Generalized hyperhidrosis: Secondary | ICD-10-CM | POA: Diagnosis not present

## 2019-09-18 DIAGNOSIS — D849 Immunodeficiency, unspecified: Secondary | ICD-10-CM | POA: Diagnosis not present

## 2019-09-18 DIAGNOSIS — M85852 Other specified disorders of bone density and structure, left thigh: Secondary | ICD-10-CM | POA: Diagnosis not present

## 2019-09-18 DIAGNOSIS — K769 Liver disease, unspecified: Secondary | ICD-10-CM | POA: Diagnosis not present

## 2019-09-18 DIAGNOSIS — R5383 Other fatigue: Secondary | ICD-10-CM | POA: Diagnosis not present

## 2019-09-18 LAB — HEMOGLOBIN A1C
ESTIMATED AVERAGE GLUCOSE: 143 mg/dL
Hemoglobin A1c/Hemoglobin.total:MFr:Pt:Bld:Qn:: 6.6 — ABNORMAL HIGH

## 2019-09-18 LAB — CBC W/ AUTO DIFF
BASOPHILS ABSOLUTE COUNT: 0.1 10*9/L (ref 0.0–0.1)
BASOPHILS RELATIVE PERCENT: 0.9 %
EOSINOPHILS ABSOLUTE COUNT: 0.4 10*9/L (ref 0.0–0.4)
EOSINOPHILS RELATIVE PERCENT: 3.8 %
HEMATOCRIT: 45.8 % (ref 41.0–53.0)
HEMOGLOBIN: 15.6 g/dL (ref 13.5–17.5)
LARGE UNSTAINED CELLS: 2 % (ref 0–4)
LYMPHOCYTES ABSOLUTE COUNT: 1.9 10*9/L (ref 1.5–5.0)
LYMPHOCYTES RELATIVE PERCENT: 19.8 %
MEAN CORPUSCULAR HEMOGLOBIN CONC: 34.1 g/dL (ref 31.0–37.0)
MEAN CORPUSCULAR HEMOGLOBIN: 29.8 pg (ref 26.0–34.0)
MEAN CORPUSCULAR VOLUME: 87.4 fL (ref 80.0–100.0)
MEAN PLATELET VOLUME: 8.6 fL (ref 7.0–10.0)
MONOCYTES ABSOLUTE COUNT: 0.8 10*9/L (ref 0.2–0.8)
MONOCYTES RELATIVE PERCENT: 8 %
NEUTROPHILS RELATIVE PERCENT: 65.5 %
PLATELET COUNT: 230 10*9/L (ref 150–440)
RED BLOOD CELL COUNT: 5.23 10*12/L (ref 4.50–5.90)
WBC ADJUSTED: 9.4 10*9/L (ref 4.5–11.0)

## 2019-09-18 LAB — COMPREHENSIVE METABOLIC PANEL
ALBUMIN: 4.6 g/dL (ref 3.4–5.0)
ALT (SGPT): 51 U/L — ABNORMAL HIGH (ref 10–49)
ANION GAP: 7 mmol/L (ref 5–14)
AST (SGOT): 35 U/L — ABNORMAL HIGH (ref <=34)
BILIRUBIN TOTAL: 0.3 mg/dL (ref 0.3–1.2)
BLOOD UREA NITROGEN: 17 mg/dL (ref 9–23)
BUN / CREAT RATIO: 16
CALCIUM: 9.9 mg/dL (ref 8.7–10.4)
CHLORIDE: 102 mmol/L (ref 98–107)
CO2: 27 mmol/L (ref 20.0–31.0)
CREATININE: 1.05 mg/dL
EGFR CKD-EPI AA MALE: >90 mL/min/{1.73_m2} (ref >=60)
EGFR CKD-EPI NON-AA MALE: 78 mL/min/{1.73_m2} (ref >=60)
GLUCOSE RANDOM: 152 mg/dL — ABNORMAL HIGH (ref 70–99)
POTASSIUM: 4.9 mmol/L — ABNORMAL HIGH (ref 3.4–4.5)
PROTEIN TOTAL: 7.8 g/dL (ref 5.7–8.2)
SODIUM: 136 mmol/L (ref 135–145)

## 2019-09-18 LAB — CMV DNA, QUANTITATIVE, PCR

## 2019-09-18 LAB — HEPATITIS B SURFACE ANTIBODY: HEPATITIS B SURFACE ANTIBODY: NONREACTIVE

## 2019-09-18 LAB — TACROLIMUS, TROUGH: Lab: 4.7 — ABNORMAL LOW

## 2019-09-18 LAB — HEPATITIS B SURFACE ANTIBODY QUANT: Hepatitis B virus surface Ab:ACnc:Pt:Ser:Qn:: <8

## 2019-09-18 LAB — PHOSPHORUS: Phosphate:MCnc:Pt:Ser/Plas:Qn:: 2.9

## 2019-09-18 LAB — TESTOSTERONE: TESTOSTERONE TOTAL: 212 ng/dL

## 2019-09-18 LAB — HEPATITIS B SURFACE ANTIGEN: Hepatitis B virus surface Ag:PrThr:Pt:Ser:Ord:: NONREACTIVE

## 2019-09-18 LAB — HEPATITIS B CORE TOTAL ANTIBODY: Hepatitis B virus core Ab:PrThr:Pt:Ser/Plas:Ord:IA: NONREACTIVE

## 2019-09-18 LAB — ALKALINE PHOSPHATASE: Alkaline phosphatase:CCnc:Pt:Ser/Plas:Qn:: 87

## 2019-09-18 LAB — MAGNESIUM: Magnesium:MCnc:Pt:Ser/Plas:Qn:: 1.8

## 2019-09-18 LAB — CMV QUANT: Lab: 0

## 2019-09-18 LAB — GAMMA GLUTAMYL TRANSFERASE: Gamma glutamyl transferase:CCnc:Pt:Ser/Plas:Qn:: 36

## 2019-09-18 LAB — BILIRUBIN DIRECT: Bilirubin.glucuronidated+Bilirubin.albumin bound:MCnc:Pt:Ser/Plas:Qn:: 0.1

## 2019-09-18 LAB — LYMPHOCYTES RELATIVE PERCENT: Lymphocytes/100 leukocytes:NFr:Pt:Bld:Qn:Automated count: 19.8

## 2019-09-18 LAB — TESTOSTERONE TOTAL: Testosterone:MCnc:Pt:Ser/Plas:Qn:: 212

## 2019-09-18 LAB — AFP-TUMOR MARKER: Alpha-1-Fetoprotein.tumor marker:MCnc:Pt:Ser/Plas:Qn:: <2

## 2019-09-18 MED ORDER — ASPIRIN 81 MG TABLET,DELAYED RELEASE
ORAL_TABLET | Freq: Every day | ORAL | 11 refills | 30.00000 days | Status: CP
Start: 2019-09-18 — End: 2020-09-17

## 2019-09-18 MED ORDER — METFORMIN ER 500 MG TABLET,EXTENDED RELEASE 24 HR
ORAL_TABLET | Freq: Two times a day (BID) | ORAL | 11 refills | 30.00000 days | Status: CP
Start: 2019-09-18 — End: 2020-09-17
  Filled 2019-10-02: qty 120, 30d supply, fill #0

## 2019-09-18 MED ORDER — SERTRALINE 50 MG TABLET
ORAL_TABLET | Freq: Every day | ORAL | 11 refills | 30 days | Status: CP
Start: 2019-09-18 — End: 2020-09-17
  Filled 2019-10-02: qty 30, 30d supply, fill #0

## 2019-09-18 MED ADMIN — gadobenate dimeglumine (MULTIHANCE) 529 mg/mL (0.1mmol/0.2mL) solution 9 mL: 9 mL | INTRAVENOUS | @ 14:00:00 | Stop: 2019-09-18

## 2019-09-18 MED ADMIN — diazePAM (VALIUM) injection 5 mg: 5 mg | INTRAVENOUS | @ 14:00:00 | Stop: 2019-09-18

## 2019-09-18 NOTE — Unmapped (Signed)
[facility] PHARMACY NOTE  09/18/2019   Anthony Alvarado  [id number]     Medication changes today:   1. Increase sertraline to 50 mg daily  2. Increase metformin XR to 1g BID    Education/Adherence tools provided today:  1.provided updated medication list  2. provided additional pill box education  3.  provided additional education on immunosuppression and transplant related medications including reviewing indications of medications, dosing and side effects    Follow up items:  1. goal of understanding indications and dosing of immunosuppression medications    Next visit with pharmacy in 1-3 months   ____________________________________________________________________    Anthony Alvarado is a 59 y.o. male s/p orthotopic liver transplant on 09/16/2018 (Liver) 2/2 cryptogenic cirrhosis w/HCC.    Other PMH significant for HTN, T2DM, OSA, aortic stenosis    Seen by pharmacy today for: medication management and pill box fill and adherence education; last seen by pharmacy 3 months ago     CC:  Patient complains of low energy    There were no vitals filed for this visit.    Allergies   Allergen Reactions   ??? Watermelon Flavor      Mouth itching     Medications reviewed in EPIC medication station and updated today by the clinical pharmacist practitioner.    Outpatient Encounter Medications as of 09/18/2019   Medication Sig Dispense Refill   ??? acetaminophen (TYLENOL) 325 MG tablet Take 2 tablets (650 mg total) by mouth every six (6) hours as needed for pain or fever (> 38C or 100.56F). 100 tablet 0   ??? alendronate (FOSAMAX) 70 MG tablet Take 1 tablet (70 mg total) by mouth every seven (7) days. 4 tablet 11   ??? amLODIPine (NORVASC) 10 MG tablet Take 1 tablet (10 mg total) by mouth daily. 30 tablet 11   ??? aspirin (ECOTRIN) 81 MG tablet Take 1 tablet (81 mg total) by mouth daily. 30 tablet 11   ??? azelastine (ASTELIN) 137 mcg (0.1 %) nasal spray 1 spray by Each Nare route daily as needed.      ??? blood sugar diagnostic Strp Use as directed Three (3) times a day before meals. 90 each 11   ??? calcium carbonate (TUMS) 200 mg calcium (500 mg) chewable tablet Chew 1 tablet Three (3) times a day as needed for heartburn.      ??? cholecalciferol, vitamin D3, 1,000 unit (25 mcg) tablet Take 2 tablets (2,000 Units total) by mouth daily. 60 tablet 11   ??? ENVARSUS XR 1 mg Tb24 extended release tablet Take 1 tablet (1 mg total) by mouth daily with 1 (0.75 mg) tablet for a total dose of 1.75 mg daily 30 tablet 11   ??? fluticasone propionate (FLONASE) 50 mcg/actuation nasal spray PLACE ONE OR TWO SPRAYS INTO BOTH NOSTRILS DAILY AS NEEDED.     ??? lancets 33 gauge Misc 1 each by Miscellaneous route Three (3) times a day before meals. 100 each 11   ??? levothyroxine (SYNTHROID) 150 MCG tablet Take 1 tablet (150 mcg total) by mouth daily. 30 tablet 11   ??? metFORMIN (GLUCOPHAGE XR) 500 MG 24 hr tablet Take 1 tablet (500 mg total) by mouth two (2) times a day. 60 tablet 11   ??? mycophenolate (MYFORTIC) 180 MG EC tablet Take 1 tablet (180 mg total) by mouth Two (2) times a day. 60 tablet 11   ??? pravastatin (PRAVACHOL) 40 MG tablet Take 1 tablet (40 mg total) by  mouth every evening. 90 tablet 3   ??? sertraline (ZOLOFT) 25 MG tablet Take 1 tablet (25 mg total) by mouth daily. 30 tablet 11   ??? ENVARSUS XR 0.75 mg Tb24 extended release tablet Take 1 tablet (0.75 mg total) by mouth daily with 1 (1 mg) tablet for a total dose of 1.75 mg daily. 30 tablet 11   ??? testosterone (ANDROGEL) 1 % (25 mg/2.5gram) GlPk Apply 5g (2 packet) to each shoulder/upper arm daily for a total daily dose of 10g. 120 packet 3     Facility-Administered Encounter Medications as of 09/18/2019   Medication Dose Route Frequency Provider Last Rate Last Admin   ??? diazePAM (VALIUM) 5 mg/mL injection            ??? [COMPLETED] diazePAM (VALIUM) injection 5 mg  5 mg Intravenous Once Chirag Lanney Gins, MD   5 mg at 09/18/19 0981   ??? [COMPLETED] gadobenate dimeglumine (MULTIHANCE) 529 mg/mL (0.68mmol/0.2mL) solution 9 mL  9 mL Intravenous Once in imaging Chirag Lanney Gins, MD   9 mL at 09/18/19 1003       Induction agent : basiliximab    CURRENT IMMUNOSUPPRESSION: Envarsus 1.75 mg PO daily (switched from IR tac due to cost)   prograf/Envarsus/cyclosporine goal: 4-6   [id number]  mg PO bid    steroid free     Patient complains of mild tremor (unchanged from prior)    IMMUNOSUPPRESSION DRUG LEVELS:  Lab Results   Component Value Date    Tacrolimus, Trough 9.7 06/19/2019    Tacrolimus, Trough 6.5 04/17/2019    Tacrolimus, Trough 7.9 12/28/2018    Tacrolimus Lvl 5.9 09/06/2019    Tacrolimus Lvl 4.2 08/23/2019    Tacrolimus Lvl 5.4 08/09/2019     No results found for: CYCLO  No results found for: EVEROLIMUS  No results found for: SIROLIMUS    Envarsus level is accurate 24 hour trough    Graft function: slightly elevated LFTs today  DSA: ntd   Explant biopsy: well differentiated HCC with no vascular invasion, focal high grade dysplastic nodule with small cell change in segment V  Biopsies to date: ntd  WBC/ANC:  wnl     Plan: Will maintain current immunosuppression.  Continue to monitor.    OI Prophylaxis:   CMV Status: D-/ R+, moderate risk. CMV prophylaxis: valganciclovir 450 mg daily x 3 months (complete)  No results found for: CMVCP  PCP Prophylaxis: bactrim SS 1 tab MWF x 6 months (complete)   Thrush:  completed in hospital  Patient is  off prophylaxis    Plan: prophylaxis completed. Continue to monitor.    CV Prophylaxis: asa 81 mg   The 10-year ASCVD risk score Denman George DC Jr., et al., 2013) is: 11.6%  Statin therapy: Indicated; pravastatin 40 mg daily  Plan:  Continue to monitor     BP: Goal < 140/90. Clinic vitals reported above  Home BP ranges: not checking at home but is at goal in clinic today  Current meds include: amlodipine 10 mg daily  Plan: within goal.  Continue to monitor    Anemia:  H/H:   Lab Results   Component Value Date    HGB 15.6 09/18/2019     Lab Results Component Value Date    HCT 45.8 09/18/2019     Iron panel:  Lab Results   Component Value Date    IRON 157 01/04/2018    TIBC 305.9 01/04/2018    FERRITIN 116.0 01/04/2018  Lab Results   Component Value Date    Iron Saturation (%) 51 (H) 01/04/2018       Prior ESA use: none post transplant    Plan: stable, within goal. Continue to monitor.     DM:   Lab Results   Component Value Date    A1C 6.6 (H) 09/18/2019   . Goal A1c < 7  History of Dm? Yes: T2DM  Established with endocrinologist/PCP for BG managment? Yes: PCP  Currently on: metformin XR 500 mg BID  Home BS log: not checking   Diet: refer to today's dietitian note  Exercise: working on cars  Hypoglycemia: no  Plan:  Increase metformin XR to 1g BID. Defer further management to PCP.  Encouraged healthy eating habits and incorporation of exercise    Electrolytes: wnl  Meds currently on: none  Plan: Continue to monitor     GI/BM: Pt reports 1-2BM daily with occasional diarrhea  Meds currently on: TUMS prn (takes a few times per week)  Plan: Continue to monitor     Pain: pt reports moderate joint pain pain;   Meds currently on: APAP (no recent use)  Plan:. Continue to monitor    Bone health:   Vitamin D Level: 34.9 on 04/17/19. Goal > 30.   Last DEXA results: 09/18/19 lumbar T score -1.7, femoral neck T score -1.4  Meds currently on: alendronate 70 mg weekly, cholealciferol 2,000 units daily  Plan: Vitamin D level  within goal,continue supplementation. Continue to monitor.     Men's Health:  Anthony Alvarado is a 60 y.o. male. Patient reports night sweats   Meds currently on testosterone 10g daily  Plan: Continue to monitor    Hypothyroidism  Meds currently on: levothyroxine 150 mcg  Plan: Continue to monitor    Anxiety/depression - reports low energy, poor mood  Meds currently on: sertraline 25 mg daily  Plan: Increase sertraline to 50 mg daily.  Continue to monitor    Allergies  Meds currently on: azelastin PRN, Flonase PRN  Plan: continue to monitor Adherence: Patient has excellent understanding of medications; was able to independently identify names/doses of immunosuppressants and OI meds.  Patient  does fill their own pill box on a regular basis at home.  Patient brought medication card:no  Pill box:did not bring  Plan: Provided basic adherence counseling/intervention    I spent a total of 20 minutes face to face with the patient delivering clinical care and providing education/counseling.    Patient was reviewed with Gertie Fey, DNP who was agreement with the stated plan:     During this visit, the following was completed:   BG log data assessment  BP log data assessment  Labs ordered and evaluated  complex treatment plan >1 DS     All questions/concerns were addressed to the patient's satisfaction.  __________________________________________    Cleone Slim, PHARMD, CPP  [facility] TR[facility] CLINICAL PHARMACIST PRACTITIONER  PAGER [phone]

## 2019-09-18 NOTE — Unmapped (Signed)
I spent 25 minutes on the phone with the patient on the date of service. I spent an additional 10 minutes on pre- and post-visit activities on the date of service.     The patient was physically located in [place] or a state in which I am permitted to provide care. The patient and/or parent/guardian understood that s/he may incur co-pays and cost sharing, and agreed to the telemedicine visit. The visit was reasonable and appropriate under the circumstances given the patient's presentation at the time.    The patient and/or parent/guardian has been advised of the potential risks and limitations of this mode of treatment (including, but not limited to, the absence of in-person examination) and has agreed to be treated using telemedicine. The patient's/patient's family's questions regarding telemedicine have been answered.     If the visit was completed in an ambulatory setting, the patient and/or parent/guardian has also been advised to contact their provider???s office for worsening conditions, and seek emergency medical treatment and/or call 911 if the patient deems either necessary.    **THIS PATIENT WAS NOT SEEN IN PERSON TO MINIMIZE POTENTIAL SPREAD OF COVID-19, PROTECT PATIENTS/PROVIDERS, AND REDUCE PPE UTILIZATION.**      ANNUAL POST-TRANSPLANT LIVER TRANSPLANT   SOCIAL WORK FOLLOW UP ASSESSMENT       Patient Name: Anthony Alvarado  Medical Record Number: [id number]  Date of Service: September 18, 2019     PRESENT:   [name] is scheduled for his one year post transplant psychosocial follow up.     BACKGROUND INFORMATION: [name] is a 59 y.o.. Caucasian, male post liver transplant. Patient received a liver transplant on 09/16/2018 (Liver).     BEHAVIORAL OBSERVATIONS:   He was interviewed alone.     MENTAL STATUS EXAM:  Appearance: unable to assess  Speech/Language: Normal rate, volume, tone, fluency  Mood: stable   Affect: Calm, Cooperative and Mood congruent  Orientation: Oriented to person, place, time, and general circumstances  Concentration: Able to fully concentrate and attend  Memory: Immediate, short-term, long-term, and recall grossly intact  Insight: Intact  Judgment: Intact      LIVING SITUATION:  Housing: patient lives alone   Marital status:separated  Children/dependents: 1 bio children; 2 step-children     Adherence:  Medication Adherence: Excellent  Medication Concerns: denied problems taking medications, concerns about side effects, affordability, problems obtaining medications, and difficulty remembering medications  Other Adherence: Excellent     Side Effects: none  Insurance Coverage: MISC; INSURANCE COVERAGE: A[employer] plan    Lifestyle Issues:  Physical activity:  Fair  Nutrition/Appetite:  see RD note   Sleep: Fair; finds it hard to fall back to sleep at times - describes self as a 'worrier' at baseline   Pain (0=no pain; 10=worst pain imaginable): 3/10  Pain Medications:  not on any prescribed medications and describes ongoing discomfort - describes himself as someone that does not like to take any additional medication than what is required      Substance Issues:  Nicotine/Tobacco: denied   Current alcohol use: denied   Illicit drug use: Denied  -If yes type: N/A  Illicit substance abuse or misuse: Denied    Social Issues:  Support/Caregiving Issues: Tammy/ex and sister and adult children continue to assist as needed   Social Stressors/Changes: Denied   Transportation issues: Denied   Insurance Issues: Denied  Source of Income: continues to be on disability through November 2021; discussed resources     Adjustment Issues:  Feelings  about transplant: overall good but feels 'adjusting to my new baseline'   PTSD: No; reports at baseline a 'worrier' which has been affecting his sleep; we discussed a referral to Transplant Psychology and he would like to hold off for now but agrees to reach out to CSW if needed. Shares that he intends on speaking with the Physicians during annual appointments today about his 'medication not working as well over the past 2 months' and his heart issues.     What would you change about transplant? Denies.      GAD-7 Score: 4       PHQ-9 Score: 0      Coping Style: Functional    ASSESSMENT:    As usual, [name] is a very pleasant man who is still amazed that it has been a year since his transplant. We discuss the difficult times with his readmissions through November 2020. He has ongoing heart valve issues that are 'moderate/severe' and patient is curious to know if this is contributing to his fatigue and breathing issues when ambulating. He intends on discussing the above with the Physicians today along with 'my medications not working as well as they did two months ago'.     He admits to be a 'worrier' at baseline and thinks that this has continued to affect his sleep. At this time, he would to wait on seeking mental health counseling or medication. Reminded patient of CSW role and assistance through Transplant Psychology and he shares that he will keep this option open.     His support system remains intact. He is receiving disability and states that it was granted through November 2021. CSW will provide him resources regarding insurance and disability following a transplant as we discussed that he should be prepared for it not to continue. He is unsure if he will return to employment.     [name] is aware that CSW can be contacted at anytime for assistance. He was appreciative of discussion.                  RECOMMENDATIONS:   1. See above     Carole Binning, MSW   Clinical Social Worker  [facility]   Completed: 09/18/19

## 2019-09-18 NOTE — Unmapped (Signed)
Outpatient Adult Nutrition-Transplant Follow Up    Referring Provider: [name]    Reason for Referral: s/p liver transplant  09/16/2018    PMH:   Patient Active Problem List   Diagnosis   ??? Diabetes mellitus without complication (CMS-HCC)   ??? Hypothyroidism   ??? Cirrhosis of liver without ascites (CMS-HCC)   ??? OSA (obstructive sleep apnea)   ??? Essential hypertension   ??? Encounter for pre-transplant evaluation for chronic liver disease   ??? Pleural effusion   ??? Pneumothorax after biopsy   ??? Liver transplant recipient (CMS-HCC)   ??? Allergic rhinitis   ??? Arthralgia   ??? Asthma   ??? Bicuspid aortic valve   ??? Flying phobia   ??? Mucosal abnormality of stomach   ??? Nephrolithiasis   ??? Pure hypercholesterolemia   ??? Umbilical hernia   ??? Unspecified hearing loss   ??? COVID-19   cirrhosis c/b HE, possible HCC    Anthropometrics:   Estimated body mass index is 30.27 kg/m?? as calculated from the following:    Height as of 06/19/19: 172.7 cm (5' 8).    Weight as of 06/19/19: 90.3 kg (199 lb 1.6 oz).  IBW: 72.6 kg  %IBW: 116%   Goal: 180 lbs    Weight History: Patient reports staying stable ~200 lbs for a few months; this is a 7.5% increase since February 2021.     Wt Readings from Last 10 Encounters:   06/19/19 90.3 kg (199 lb 1.6 oz)   06/19/19 90.3 kg (199 lb 1.6 oz)   04/17/19 84.6 kg (186 lb 8 oz)   04/17/19 84.6 kg (186 lb 8 oz)   03/07/19 85.5 kg (188 lb 6.4 oz)   02/28/19 84.5 kg (186 lb 3.2 oz)   01/31/19 80.4 kg (177 lb 4.8 oz)   01/23/19 78.8 kg (173 lb 11.2 oz)   01/11/19 81.4 kg (179 lb 7.3 oz)   01/05/19 81.4 kg (179 lb 6.4 oz)     Nutrition-Focused Physical Findings:   Unable to complete at this time due to limiting interactions between patient and provider during infectious outbreak in high risk population.    Relevant Medications, Herbs, Supplements include:  Tums, cholecalciferol, lasix, metformin, levothyroxine    Relevant Labs: A1c= 6.6 in July 2021    Lab Results   Component Value Date    A1C 6.6 (H) 09/18/2019     Physical Activity: exercise has not improved since last visit, he reports getting winded walking 200- 300 ft so cannot do much.     Dietary Restrictions, Intolerances: none at this time      Allergies:   Allergies   Allergen Reactions   ??? Watermelon Flavor      Mouth itching     Hunger and Satiety: good appetite per pt, nothing bothers him     24-Hour recall/usual intake:    1st - skips due to medication timing    Snack - none  2nd - tomato soup or chicken broth w/ pinto/lima beans, pizza or spaghetti once a week   Snack - cashews or pretzels   3rd 5/6pm- grilled cheese sandwich  Beverages - diet coke ~1-2x per day, up from a few cans per week, less water daily     Nutrition History: Pt denies n/v/d/c. PO intake has stabilized and he reports gaining more weight since last visit. Pt contributes weight gain to boredom and lack of exercise. He has been snacking more than he used to do and decreased  his water intake. Encouraged patient to decrease diet soda intake and add back in water throughout the day. Also recommended starting to label read given elevated blood sugars and weight gain.      Estimated Needs:    Estimated Energy Needs: 1860- 2115 kcal/day (22- 25 kcal/kg goal weight)  Estimated Protein Needs: 70- 85 g protein/day (0.8- 1.0 g/kg goal weight)  Estimated CHO Needs: <45% total kcal  Estimated Fluid Needs:  per MD    Nutrition Assessment: PO intake appears to be excessive at this time. He continues to snack frequently on pretzels and diet soda. His goal is to lose weight to get back to 180 lbs, which is where he feels the best. He has gained ~ 14 lbs in the past 6 months and he reports this gain is in abdomen. Exercise continues to be limited d/t shortness of breath. Patient plans to discussed this with medical team. Discussed strategies to help decrease boredom snacking, and encouraged pt consume more water daily.     Malnutrition Assessment using AND/ASPEN Clinical Characteristics:  Unable to assess at this time as unable to perform updated NFPE    Nutrition Education: brief review of carbohydrate counting     Nutrition Goals:    1. Meet estimated daily needs  2. Carbohydrate controlled diet   3. Increase strength/muscle mass    Interventions:     1. Monitor carbohydrate intake, aim for ~60 grams carb per meal   2. Continue sodium intake at no more than 2400 mg per day   3. Decrease snacking, and when snacking always pair a carb with a protein or heart healthy fat   4. Increase physical activity  --- aim for walking or strength training for 150 minutes per week  5. Decrease his soda intake to 1 can per day, increase water back up to 6 bottles daily      Materials Provided were: [facility] wellness website link    Follow-up: PRN     I am located on-site and the patient is located on-site for this visit.          I spent 17 minutes on the real-time audio and video with the patient on the date of service. I spent an additional 25 minutes on pre- and post-visit activities on the date of service.     The patient was physically located in [place] or a state in which I am permitted to provide care. The patient and/or parent/guardian understood that s/he may incur co-pays and cost sharing, and agreed to the telemedicine visit. The visit was reasonable and appropriate under the circumstances given the patient's presentation at the time.    The patient and/or parent/guardian has been advised of the potential risks and limitations of this mode of treatment (including, but not limited to, the absence of in-person examination) and has agreed to be treated using telemedicine. The patient's/patient's family's questions regarding telemedicine have been answered.     If the visit was completed in an ambulatory setting, the patient and/or parent/guardian has also been advised to contact their provider???s office for worsening conditions, and seek emergency medical treatment and/or call 911 if the patient deems either necessary.    Lanelle Bal, RD, LDN  Abdominal Transplant Dietitian   Pager: [phone]

## 2019-09-18 NOTE — Unmapped (Signed)
FOLLOW UP ANNUAL LIVER CLINIC NOTE     Patient Name: Anthony Alvarado  Medical Record Number: [id number]  Date of Service: 09/19/2019    Referring Physician: [name]   Current complaint: Follow up Annual Liver    Assessment/Plan:     Anthony Alvarado is a 59 y.o. male who underwent liver transplant on 09/16/2018 for cryptogenic cirrhosis with HCC.  Recent testing/images with slight elevation to LFTs today. Patient has been taking 1.75 mg daily of Envarsus and 180mg  BID of myfortic; immunosuppressive levels were within goal range (4-6) but on the lower end.       History of HCC  - MRI and AFP <2 no concern for disease recurrence  -would like to discontinue MMF if LFTs stabilize     Obesity/DM  - hemoglobin A1c now concerning for diabetes 6.6  - escalate metformin dose 1g BID    Night Sweats/Stamina  - reports he has not been compliant with testosterone dosing but feels it alleviates symptoms when he does routinely take it  - reinforced recommendation for daily use  - will follow up PRN, future management per PCP    Dyspnea on Exertion/Fatigue  - Appreciate local cardiology management given worsening of murmur on physical exam. Can refer to [facility] valvular clinic PRN.  - Order PFTs today given easy DOE, presumed more likely 2/2 aortic stenosis but given prior pulmonary issues, low threshold to investigate.   - euthyroid, PCP to continue monitoring      HEALTH MAINTENANCE:   - Dermatology: This patient is at increased risk for the development of skin cancers due to immunosuppressant medications. We recommend yearly surveillance.  - Colonoscopy: Until age 69, we recommend routine surveillance.   - Dental: no issues  - Mental health: increase zoloft today. Reports he is not quite at his baseline.  - DEXA scan improved from pre-transplant baseline. Vit D pending.  Immunization History   Administered Date(s) Administered   ??? COVID-19 VACC,MRNA,(PFIZER)(PF)(IM) 08/31/2019   ??? INFLUENZA TIV (TRI) 21MO+ W/ PRESERV (IM) 11/18/2011   ??? Influenza Vaccine Quad (IIV4 PF) 37mo+ injectable 01/25/2014, 02/11/2015, 01/19/2017, 01/11/2018, 10/28/2018   ??? Influenza Virus Vaccine, unspecified formulation 01/11/2018, 11/23/2018   ??? Influenza Whole 12/27/2007, 10/29/2009   ??? PNEUMOCOCCAL POLYSACCHARIDE 23 01/11/2018   ??? Pneumococcal Conjugate 13-Valent 04/17/2019   ??? Tetanus and diptheria,(adult), adsorbed, 2Lf tetanus toxoid, PF 03/05/1997, 10/09/2008     Reeducated patient on the importance of preventing illness, infection, and opportunistic cancers and the rationale behind vaccines and routine imaging, given his status as an immunocompromised person.     Return to clinic: 1 year, MRI in 6 months  Labs: 2 weeks, then transition to monthly    I personally spent 40 minutes face-to-face and non-face-to-face in the care of this patient, which includes all pre, intra, and post visit time on the date of service. Greater than 50% of the time was spent on counseling and the substance of the discussion.    Subjective:     HPI: Anthony Alvarado is a 59 y.o. male who underwent liver transplant on 09/16/2018 for cryptogenic cirrhosis with HCC.  PMH additionally notable for severe aortic valve stenosis, HTN, DMII, OSA and Autoimmune cholangitis, carrier of hemochromatosis HFE gene mutation.      Of note explant remonstrated viable HCC within segment 6 but without vascular, lymph, or bile duct invasion. There was also a high grade dysplastic nodule with cirrhotic changes in segment 5. Explant also potentially consistent with  underlying A1AT.     Initial post-operative course complicated by early pneumonia mid august 2020.  He was again hospitalized from October 2020 for chest pain; MRI obtained for [facility] surveillance which showed stable posterior hepatic fluid collection (post-op hematoma unchanged in size) and loculated ascites along the falciform ligament, no enhancement to suggest abscess. VIR performed percutaneous aspiration of perihepatic fluid collection on 10/8. Patient also had new onset back pain with negative plain films - he was seen by ortho who recommended PT as an outpatient.    Admitted again for recurrence of r sided pleural effusion and associated SOB November 2020. underwent thoracentesis with Interventional Pulmonology on 11/3 with 1L serosanguinous fluid evacuated. Pleural fluid analysis showed eosiniphillic predominance. He redeveloped a pleural effusion 11/18, with loculations, underwent pigtail catheter placement by interventional pulmonology with 2L of output. Subsequent Thoracic Surgery consult in the setting of multiple pleural effusions now with loculations and a residual basilar pneumothorax in the right lung who recommended and completed Right pulmonary decortication and pleurodesis on 01/13/2019.     Unfortunately, he contracted COVID treated with BAM in the outpatient setting, he was able to maintain pulmonary function and has since recovered and received his first COVID Vaccine. He has additionally complained about poor stamina, night sweats, and inability to lose weight.     Denies fever, chills, chest pain, SOB, N/V/D, constipation or abdominal pain. No acute complaint today.    Past Medical History:   Diagnosis Date   ??? Aortic valve stenosis    ??? Autoimmune cholangitis    ??? 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 Cholestatic cirrhosis (CMS-HCC)    ??? Hepatic encephalopathy (CMS-HCC)    ??? Hypertension    ??? Sleep apnea    ??? Type 2 diabetes mellitus (CMS-HCC)        Past Surgical History:   Procedure Laterality Date   ??? APPENDECTOMY     ??? CHG US GUIDE, TISSUE ABLATION N/A 05/03/2018    Procedure: ULTRASOUND GUIDANCE FOR, AND MONITORING OF, PARENCHYMAL TISSUE ABLATION;  Surgeon: Particia Nearing, MD;  Location: MAIN OR [facility];  Service: Transplant   ??? PR CATH PLACE/CORON ANGIO, IMG SUPER/INTERP,R&L HRT CATH, L HRT VENTRIC N/A 03/21/2018    Procedure: Left/Right Heart Catheterization;  Surgeon: Neal Dy, MD; Location: [facility] CATH;  Service: Cardiology   ??? PR DECORTICATION,PULMONARY,TOTAL Right 01/13/2019    Procedure: Decortic Pulm (Separt Proc); Tot;  Surgeon: Evert Kohl, MD;  Location: MAIN OR [facility];  Service: Thoracic   ??? PR EGD FLEXIBLE FOREIGN BODY REMOVAL N/A 12/26/2018    Procedure: UGI ENDOSCOPY; W/REMOVAL FOREIGN BODY;  Surgeon: Vonda Antigua, MD;  [facility];  Service: Gastroenterology   ??? PR LAP,ABLAT 1+ LIVER TUMOR(S),RADIOFREQ N/A 05/03/2018    Procedure: LAPAROSCOPY, SURGICAL, ABLATION OF 1 OR MORE LIVER TUMOR(S); RADIOFREQUENCY;  Surgeon: Particia Nearing, MD;  Location: MAIN OR [facility];  Service: Transplant   ??? PR PERQ DRAINAGE PLEURA INSERT CATH W/IMAGING N/A 01/11/2019    Procedure: PLEURAL DRAINAGE, PERC, W INSERTION OF INDWELLING CATHETER; W IMAGING GUIDANCE;  Surgeon: Jerelyn Charles, MD;  Location: BRONCH PROCEDURE LAB [facility];  Service: Pulmonary   ??? PR THORACENTESIS NEEDLE/CATH PLEURA W/IMAGING N/A 01/06/2019    Procedure: THORACENTESIS W/ IMAGING;  Surgeon: Cleora Fleet, MD;  Location: BRONCH PROCEDURE LAB [facility];  Service: Pulmonary   ??? PR THORACOSCOPY SURG W/PLEURODESIS Right 01/13/2019    Procedure: THORACOSCOPY, SURGICAL; WITH PLEURODESIS (EG, MECHANICAL  OR CHEMICAL);  Surgeon: Evert Kohl, MD;  Location: MAIN OR [facility];  Service: Thoracic   ??? PR TRANSPLANT LIVER,ALLOTRANSPLANT Bilateral 09/16/2018    Procedure: LIVER ALLOTRANSPLANTATION; ORTHOTOPIC, PARTIAL OR WHOLE, FROM CADAVER OR LIVING DONOR, ANY AGE;  Surgeon: Florene Glen, MD;  Location: MAIN OR [facility];  Service: Transplant   ??? PR TRANSPLANT,PREP DONOR LIVER, WHOLE N/A 09/16/2018    Procedure: [facility] STD PREP CAD DONOR WHOLE LIVER GFT PRIOR TNSPLNT,INC CHOLE,DISS/REM SURR TISSU WO TRISEG/LOBE SPLT;  Surgeon: Florene Glen, MD;  Location: MAIN OR [facility];  Service: Transplant   ??? SKIN BIOPSY         Family History   Problem Relation Age of Onset   ??? Asthma Mother ??? COPD Father    ??? Cancer Father    ??? Autoimmune disease Sister         AIH   ??? Heart disease Sister    ??? Colon cancer Maternal Grandfather    ??? Melanoma Neg Hx    ??? Basal cell carcinoma Neg Hx    ??? Squamous cell carcinoma Neg Hx        Social History     Socioeconomic History   ??? Marital status: Legally Separated     Spouse name: Not on file   ??? Number of children: Not on file   ??? Years of education: Not on file   ??? Highest education level: Not on file   Occupational History   ??? Not on file   Tobacco Use   ??? Smoking status: Never Smoker   ??? Smokeless tobacco: Never Used   Vaping Use   ??? Vaping Use: Never used   Substance and Sexual Activity   ??? Alcohol use: Not Currently   ??? Drug use: Never   ??? Sexual activity: Not on file   Other Topics Concern   ??? Do you use sunscreen? Yes   ??? Tanning bed use? No   ??? Are you easily burned? Yes   ??? Excessive sun exposure? No   ??? Blistering sunburns? Yes   Social History Narrative   ??? Not on file     Social Determinants of Health     Financial Resource Strain:    ??? Difficulty of Paying Living Expenses:    Food Insecurity:    ??? Worried About Programme researcher, broadcasting/film/video in the Last Year:    ??? Barista in the Last Year:    Transportation Needs:    ??? Freight forwarder (Medical):    ??? Lack of Transportation (Non-Medical):    Physical Activity:    ??? Days of Exercise per Week:    ??? Minutes of Exercise per Session:    Stress:    ??? Feeling of Stress :    Social Connections:    ??? Frequency of Communication with Friends and Family:    ??? Frequency of Social Gatherings with Friends and Family:    ??? Attends Religious Services:    ??? Database administrator or Organizations:    ??? Attends Engineer, structural:    ??? Marital Status:        REVIEW OF SYSTEMS:   The balance of 10/12 systems is negative with the exception of HPI.    Objective:     MEDICATIONS:  Allergies   Allergen Reactions   ??? Watermelon Flavor      Mouth itching       Current Outpatient Medications   Medication  Sig Dispense Refill   ??? acetaminophen (TYLENOL) 325 MG tablet Take 2 tablets (650 mg total) by mouth every six (6) hours as needed for pain or fever (> 38C or 100.14F). 100 tablet 0   ??? alendronate (FOSAMAX) 70 MG tablet Take 1 tablet (70 mg total) by mouth every seven (7) days. 4 tablet 11   ??? amLODIPine (NORVASC) 10 MG tablet Take 1 tablet (10 mg total) by mouth daily. 30 tablet 11   ??? aspirin (ECOTRIN) 81 MG tablet Take 1 tablet (81 mg total) by mouth daily. 30 tablet 11   ??? azelastine (ASTELIN) 137 mcg (0.1 %) nasal spray 1 spray by Each Nare route daily as needed.      ??? blood sugar diagnostic Strp Use as directed Three (3) times a day before meals. 90 each 11   ??? calcium carbonate (TUMS) 200 mg calcium (500 mg) chewable tablet Chew 1 tablet Three (3) times a day as needed for heartburn.      ??? cholecalciferol, vitamin D3, 1,000 unit (25 mcg) tablet Take 2 tablets (2,000 Units total) by mouth daily. 60 tablet 11   ??? ENVARSUS XR 1 mg Tb24 extended release tablet Take 1 tablet (1 mg total) by mouth daily with 1 (0.75 mg) tablet for a total dose of 1.75 mg daily 30 tablet 11   ??? fluticasone propionate (FLONASE) 50 mcg/actuation nasal spray PLACE ONE OR TWO SPRAYS INTO BOTH NOSTRILS DAILY AS NEEDED.     ??? lancets 33 gauge Misc 1 each by Miscellaneous route Three (3) times a day before meals. 100 each 11   ??? levothyroxine (SYNTHROID) 150 MCG tablet Take 1 tablet (150 mcg total) by mouth daily. 30 tablet 11   ??? metFORMIN (GLUCOPHAGE XR) 500 MG 24 hr tablet Take 2 tablets (1,000 mg total) by mouth two (2) times a day. 120 tablet 11   ??? mycophenolate (MYFORTIC) 180 MG EC tablet Take 1 tablet (180 mg total) by mouth Two (2) times a day. 60 tablet 11   ??? pravastatin (PRAVACHOL) 40 MG tablet Take 1 tablet (40 mg total) by mouth every evening. 90 tablet 3   ??? sertraline (ZOLOFT) 50 MG tablet Take 1 tablet (50 mg total) by mouth daily. 30 tablet 11   ??? ENVARSUS XR 0.75 mg Tb24 extended release tablet Take 1 tablet (0.75 mg total) by mouth daily with 1 (1 mg) tablet for a total dose of 1.75 mg daily. 30 tablet 11   ??? testosterone (ANDROGEL) 1 % (25 mg/2.5gram) GlPk Apply 5g (2 packet) to each shoulder/upper arm daily for a total daily dose of 10g. 120 packet 3     No current facility-administered medications for this visit.     Facility-Administered Medications Ordered in Other Visits   Medication Dose Route Frequency Provider Last Rate Last Admin   ??? diazePAM (VALIUM) 5 mg/mL injection                  PHYSICAL EXAM:  BP 117/86  - Pulse 73  - Temp 36.8 ??C (98.3 ??F) (Tympanic)  - Ht 172.7 cm (5' 8)  - Wt 93.7 kg (206 lb 8 oz)  - SpO2 100%  - BMI 31.40 kg/m??   Wt Readings from Last 6 Encounters:   09/18/19 93.7 kg (206 lb 8 oz)   09/18/19 93.7 kg (206 lb 8 oz)   06/19/19 90.3 kg (199 lb 1.6 oz)   06/19/19 90.3 kg (199 lb 1.6 oz)   04/17/19 84.6 kg (  186 lb 8 oz)   04/17/19 84.6 kg (186 lb 8 oz)       General Appearance:  NAD, well appearing and well nourished. obese   HEENT:  [place]/AT. Well hydrated moist mucous membranes of the oral cavity. No scleral icterus. No cervical lymphadenopathy.   Pulmonary:    Normal respiratory effort. CTAB, without wheezes/crackles/rhonchi. Good air movement.    Cardiovascular:  Regular rate and rhythm, loud holosystolic murmur with gallop. regular rate.   Extremities No edema.    Abdomen:   Normoactive bowel sounds, abdomen soft, non-tender and not distended, no Hepatosplenomegaly or masses. Abdominal scar well healed without hernia.    Musculoskeletal: No joint tenderness, full ROM. Normal gait.    Skin: Skin color, texture, turgor normal, no rashes or lesions.   Neurologic: Grossly intact.   Psychiatric: Judgement and insight appropriate.        LAB RESULTS Personally reviewed  All lab results last 24 hours:    Recent Results (from the past 48 hour(s))   Hemoglobin A1c    Collection Time: 09/18/19  7:53 AM   Result Value Ref Range    Hemoglobin A1C 6.6 (H) 4.8 - 5.6 %    Estimated Average Glucose 143 mg/dL CMV DNA, quantitative, PCR    Collection Time: 09/18/19  7:53 AM   Result Value Ref Range    CMV Viral Ld Not Detected Not Detected    CMV Quant      CMV Quant Log10      CMV Comment     Hepatitis B Surface Antigen    Collection Time: 09/18/19  7:53 AM   Result Value Ref Range    Hep B Surface Ag Nonreactive Nonreactive   Hepatitis B Core Antibody, total    Collection Time: 09/18/19  7:53 AM   Result Value Ref Range    Hep B Core Total Ab Nonreactive Nonreactive   Hepatitis B Surface Antibody    Collection Time: 09/18/19  7:53 AM   Result Value Ref Range    Hep B S Ab Nonreactive Nonreactive, [place]    Hep B Surf Ab Quant <8.00 <8.00 m(IU)/mL   AFP tumor marker    Collection Time: 09/18/19  7:53 AM   Result Value Ref Range    AFP-Tumor Marker <2 <=8 ng/mL   Tacrolimus Level, Trough    Collection Time: 09/18/19  7:53 AM   Result Value Ref Range    Tacrolimus, Trough 4.7 (L) 5.0 - 15.0 ng/mL   Gamma GT    Collection Time: 09/18/19  7:53 AM   Result Value Ref Range    GGT 36 0 - 73 U/L   Magnesium Level    Collection Time: 09/18/19  7:53 AM   Result Value Ref Range    Magnesium 1.8 1.6 - 2.6 mg/dL   Phosphorus Level    Collection Time: 09/18/19  7:53 AM   Result Value Ref Range    Phosphorus 2.9 2.4 - 5.1 mg/dL   Bilirubin, Direct    Collection Time: 09/18/19  7:53 AM   Result Value Ref Range    Bilirubin, Direct 0.10 0.00 - 0.30 mg/dL   Comprehensive Metabolic Panel    Collection Time: 09/18/19  7:53 AM   Result Value Ref Range    Sodium 136 135 - 145 mmol/L    Potassium 4.9 (H) 3.4 - 4.5 mmol/L    Chloride 102 98 - 107 mmol/L    Anion Gap 7 5 - 14 mmol/L  CO2 27.0 20.0 - 31.0 mmol/L    BUN 17 9 - 23 mg/dL    Creatinine 9.60 4.54 - 1.10 mg/dL    BUN/Creatinine Ratio 16     EGFR CKD-EPI Non-African American, Male 29 >=60 mL/min/1.45m2    EGFR CKD-EPI African American, Male >90 >=60 mL/min/1.56m2    Glucose 152 (H) 70 - 99 mg/dL    Calcium 9.9 8.7 - 09.8 mg/dL    Albumin 4.6 3.4 - 5.0 g/dL    Total Protein 7.8 5.7 - 8.2 g/dL    Total Bilirubin 0.3 0.3 - 1.2 mg/dL    AST 35 (H) <=11 U/L    ALT 51 (H) 10 - 49 U/L    Alkaline Phosphatase 87 46 - 116 U/L   CBC w/ Differential    Collection Time: 09/18/19  7:53 AM   Result Value Ref Range    WBC 9.4 4.5 - 11.0 10*9/L    RBC 5.23 4.50 - 5.90 10*12/L    HGB 15.6 13.5 - 17.5 g/dL    HCT 91.4 78.2 - 95.6 %    MCV 87.4 80.0 - 100.0 fL    MCH 29.8 26.0 - 34.0 pg    MCHC 34.1 31.0 - 37.0 g/dL    RDW 21.3 08.6 - 57.8 %    MPV 8.6 7.0 - 10.0 fL    Platelet 230 150 - 440 10*9/L    Neutrophils % 65.5 %    Lymphocytes % 19.8 %    Monocytes % 8.0 %    Eosinophils % 3.8 %    Basophils % 0.9 %    Absolute Neutrophils 6.2 2.0 - 7.5 10*9/L    Absolute Lymphocytes 1.9 1.5 - 5.0 10*9/L    Absolute Monocytes 0.8 0.2 - 0.8 10*9/L    Absolute Eosinophils 0.4 0.0 - 0.4 10*9/L    Absolute Basophils 0.1 0.0 - 0.1 10*9/L    Large Unstained Cells 2 0 - 4 %   Testosterone    Collection Time: 09/18/19  7:53 AM   Result Value Ref Range    Testosterone 212 188 - 684 ng/dL         IMAGING Personally reviewed   MRI Abdomen W Wo Contrast  Result Date: 09/18/2019  Unchanged sequelae of liver transplantation without evidence of metastatic disease.    Dexa Bone Density Skeletal  Result Date: 09/18/2019  Lumbar spine:  Osteopenia.  The measurement has increased significantly since prior study. Left proximal femur: The femoral neck density indicates osteopenia, but the total femoral density is normal.  The measurement has not changed significantly since prior study.      _______________________________________________        Gertie Fey, DNP, APRN, FNP-C  [facility]  [address]  [place], [place]  [address]

## 2019-09-19 NOTE — Unmapped (Signed)
Addended by: Genia Harold on: 09/19/2019 02:48 PM     Modules accepted: Orders

## 2019-09-20 ENCOUNTER — Other Ambulatory Visit: Payer: Self-pay | Admitting: Family Medicine

## 2019-09-20 DIAGNOSIS — E039 Hypothyroidism, unspecified: Secondary | ICD-10-CM

## 2019-09-21 ENCOUNTER — Ambulatory Visit: Payer: BC Managed Care – PPO | Attending: Internal Medicine

## 2019-09-21 DIAGNOSIS — Z23 Encounter for immunization: Secondary | ICD-10-CM

## 2019-09-21 NOTE — Unmapped (Signed)
Patient seen in clinic today by multiple providers for his first liver transplant anniversary. He rpeorts he feels well overall, without c/o n/v/d/swelling or chronic pain, but he does endorse more pain to his joints over the last year, low energy, and increased dyspnea. Patient does have documented hx of mod-severe cardiac valvular dysfunction, which NP Martin-Velez auscultated. He should be following up with cardiology in the next month or two and was encouraged by NP to come to valv clinic at [facility] if surgery was deemed necessary. PharmD reassured him that there no specific meds likely responsible for these sx but increased his metformin for better glucose control. Per NP, stamina and sob could be related to pulmonary scarring from his vats procedure or possibly r/t Jan.covid infection. Per her recommendation, pfts ordered and TPA notified to assist with scheduling. Patient has had significant wt gain since transplant, which was discussed with him. He was reassured he no longer had wt lifting restrictions from a txp perspective, if this was a form of exercise he would choose. Final plan for for patient to f/u with cardiology/pfts for SOB. If joint pain does not improve with better glucose control, stronger efforts to manage his wt better, and consistent use of his testosterone replacement, then he would be referred to rheumatology. Patient will receive his 2nd covid vaccine this week. Shingrix deferred until after completion of covid series. Spent 10 minutes in discussion r/t post-txp care and health maintenance. Patient verbalized understanding of all discussed.

## 2019-09-21 NOTE — Progress Notes (Signed)
   Covid-19 Vaccination Clinic  Name:  [name]    MRN: [id number] DOB: [birth year]  09/21/2019  Mr. Pinder was observed post Covid-19 immunization for 30 minutes based on pre-vaccination screening without incident. He was provided with Vaccine Information Sheet and instruction to access the V-Safe system.   Mr. Curl was instructed to call 911 with any severe reactions post vaccine: [name] Difficulty breathing  . Swelling of face and throat  . A fast heartbeat  . A bad rash all over body  . Dizziness and weakness   Immunizations Administered    Name Date Dose VIS Date Route   Pfizer COVID-19 Vaccine 09/21/2019  8:59 AM 0.3 mL 04/19/2018 Intramuscular   Manufacturer: [place]   Lot: [id number]   [place]: [id number]

## 2019-09-22 LAB — VITAMIN D, TOTAL (25OH): Lab: 38.7

## 2019-09-25 DIAGNOSIS — Z944 Liver transplant status: Principal | ICD-10-CM

## 2019-09-25 DIAGNOSIS — E612 Magnesium deficiency: Principal | ICD-10-CM

## 2019-09-25 DIAGNOSIS — Z5181 Encounter for therapeutic drug level monitoring: Principal | ICD-10-CM

## 2019-09-26 NOTE — Unmapped (Signed)
TRF

## 2019-09-27 ENCOUNTER — Ambulatory Visit: Admit: 2019-09-27 | Discharge: 2019-09-28 | Payer: MEDICARE

## 2019-09-27 DIAGNOSIS — R0602 Shortness of breath: Secondary | ICD-10-CM | POA: Diagnosis not present

## 2019-09-27 DIAGNOSIS — R0600 Dyspnea, unspecified: Secondary | ICD-10-CM | POA: Diagnosis not present

## 2019-09-28 NOTE — Unmapped (Signed)
[facility] Refill Coordination Note    Specialty Medication(s) to be Shipped:   Transplant: Envarsus 0.75mg  and Envarsus 1mg    *pt will call back for mycophenolate, and will call back when down to 10 days- he requests a call back in 2 weeks for it.    Other medication(s) to be shipped: alendronate   Amlodipine  Metformin  Sertraline       Anthony Alvarado, DOB: 10-Nov-1960  Phone: [phone] (home)       All above HIPAA information was verified with patient.     Was a Nurse, learning disability used for this call? No    Completed refill call assessment today to schedule patient's medication shipment from the [facility] [phone]).       Specialty medication(s) and dose(s) confirmed: Regimen is correct and unchanged.   Changes to medications: Anthony Alvarado reports no changes at this time.  Changes to insurance: No  Questions for the pharmacist: No    Confirmed patient received Welcome Packet with first shipment. The patient will receive a drug information handout for each medication shipped and additional FDA Medication Guides as required.       DISEASE/MEDICATION-SPECIFIC INFORMATION        N/A    SPECIALTY MEDICATION ADHERENCE     Medication Adherence    Patient reported X missed doses in the last month: 0  Specialty Medication: envarsus xr 1mg   Patient is on additional specialty medications: Yes  Additional Specialty Medications: envarsus xr 0.75mg   Patient Reported Additional Medication X Missed Doses in the Last Month: 0                envarsus 0.75mg   : 7 days of medicine on hand   envarsus 1mg   : 7 days of medicine on hand         SHIPPING     Shipping address confirmed in Epic.     Delivery Scheduled: Yes, Expected medication delivery date: 8/10.     Medication will be delivered via UPS to the prescription address in Epic WAM.    [name]   [facility] Specialty Technician

## 2019-10-02 DIAGNOSIS — Z5181 Encounter for therapeutic drug level monitoring: Principal | ICD-10-CM

## 2019-10-02 DIAGNOSIS — Z944 Liver transplant status: Principal | ICD-10-CM

## 2019-10-02 DIAGNOSIS — E612 Magnesium deficiency: Principal | ICD-10-CM

## 2019-10-02 MED FILL — AMLODIPINE 10 MG TABLET: ORAL | 30 days supply | Qty: 30 | Fill #3

## 2019-10-02 MED FILL — ENVARSUS XR 1 MG TABLET,EXTENDED RELEASE: 30 days supply | Qty: 30 | Fill #2 | Status: AC

## 2019-10-02 MED FILL — METFORMIN ER 500 MG TABLET,EXTENDED RELEASE 24 HR: 30 days supply | Qty: 120 | Fill #0 | Status: AC

## 2019-10-02 MED FILL — ENVARSUS XR 0.75 MG TABLET,EXTENDED RELEASE: ORAL | 30 days supply | Qty: 30 | Fill #2

## 2019-10-02 MED FILL — ENVARSUS XR 1 MG TABLET,EXTENDED RELEASE: ORAL | 30 days supply | Qty: 30 | Fill #2

## 2019-10-02 MED FILL — ENVARSUS XR 0.75 MG TABLET,EXTENDED RELEASE: 30 days supply | Qty: 30 | Fill #2 | Status: AC

## 2019-10-02 MED FILL — ALENDRONATE 70 MG TABLET: ORAL | 28 days supply | Qty: 4 | Fill #10

## 2019-10-02 MED FILL — AMLODIPINE 10 MG TABLET: 30 days supply | Qty: 30 | Fill #3 | Status: AC

## 2019-10-02 MED FILL — ALENDRONATE 70 MG TABLET: 28 days supply | Qty: 4 | Fill #10 | Status: AC

## 2019-10-02 MED FILL — SERTRALINE 50 MG TABLET: 30 days supply | Qty: 30 | Fill #0 | Status: AC

## 2019-10-02 NOTE — Unmapped (Signed)
Patient's 7/26 dexascan noted osteopeniabut improvement from prior. Patient supplemented with Vit D and calcium and those levels are WNL. Reviewed by txp pharmD Mincemoyer, who made no recommendations. Patient's results sent to PCP for future management. Forwarded note to pcp with dexascan report and recent lab results.

## 2019-10-03 DIAGNOSIS — E612 Magnesium deficiency: Secondary | ICD-10-CM | POA: Diagnosis not present

## 2019-10-03 DIAGNOSIS — Z944 Liver transplant status: Secondary | ICD-10-CM | POA: Diagnosis not present

## 2019-10-03 DIAGNOSIS — Z5181 Encounter for therapeutic drug level monitoring: Secondary | ICD-10-CM | POA: Diagnosis not present

## 2019-10-04 LAB — CBC W/ DIFFERENTIAL
BANDED NEUTROPHILS ABSOLUTE COUNT: 0.1 10*3/uL (ref 0.0–0.1)
BASOPHILS RELATIVE PERCENT: 1 %
EOSINOPHILS RELATIVE PERCENT: 4 %
HEMATOCRIT: 43.2 % (ref 37.5–51.0)
HEMOGLOBIN: 14.7 g/dL (ref 13.0–17.7)
IMMATURE GRANULOCYTES: 1 %
LYMPHOCYTES ABSOLUTE COUNT: 2 10*3/uL (ref 0.7–3.1)
MEAN CORPUSCULAR HEMOGLOBIN CONC: 34 g/dL (ref 31.5–35.7)
MEAN CORPUSCULAR HEMOGLOBIN: 29.4 pg (ref 26.6–33.0)
MEAN CORPUSCULAR VOLUME: 86 fL (ref 79–97)
MONOCYTES ABSOLUTE COUNT: 1 10*3/uL — ABNORMAL HIGH (ref 0.1–0.9)
MONOCYTES RELATIVE PERCENT: 12 %
NEUTROPHILS ABSOLUTE COUNT: 5.4 10*3/uL (ref 1.4–7.0)
NEUTROPHILS RELATIVE PERCENT: 60 %
PLATELET COUNT: 225 10*3/uL (ref 150–450)
RED BLOOD CELL COUNT: 5 x10E6/uL (ref 4.14–5.80)
RED CELL DISTRIBUTION WIDTH: 13.4 % (ref 11.6–15.4)
WHITE BLOOD CELL COUNT: 8.9 10*3/uL (ref 3.4–10.8)

## 2019-10-04 LAB — COMPREHENSIVE METABOLIC PANEL
A/G RATIO: 1.8 (ref 1.2–2.2)
ALBUMIN: 4.9 g/dL (ref 3.8–4.9)
ALKALINE PHOSPHATASE: 94 IU/L (ref 48–121)
ALT (SGPT): 51 IU/L — ABNORMAL HIGH (ref 0–44)
BILIRUBIN TOTAL: 0.4 mg/dL (ref 0.0–1.2)
BLOOD UREA NITROGEN: 22 mg/dL (ref 6–24)
BUN / CREAT RATIO: 18 (ref 9–20)
CALCIUM: 10.3 mg/dL — ABNORMAL HIGH (ref 8.7–10.2)
CHLORIDE: 100 mmol/L (ref 96–106)
CO2: 23 mmol/L (ref 20–29)
CREATININE: 1.21 mg/dL (ref 0.76–1.27)
GFR MDRD AF AMER: 76 mL/min/{1.73_m2}
GFR MDRD NON AF AMER: 66 mL/min/{1.73_m2}
GLOBULIN, TOTAL: 2.7 g/dL (ref 1.5–4.5)
GLUCOSE: 146 mg/dL — ABNORMAL HIGH (ref 65–99)
SODIUM: 138 mmol/L (ref 134–144)
TOTAL PROTEIN: 7.6 g/dL (ref 6.0–8.5)

## 2019-10-04 LAB — BASOPHILS ABSOLUTE COUNT: Basophils:NCnc:Pt:Bld:Qn:Automated count: 0.1

## 2019-10-04 LAB — ALBUMIN: Albumin:MCnc:Pt:Ser/Plas:Qn:: 4.9

## 2019-10-04 LAB — MAGNESIUM: Magnesium:MCnc:Pt:Ser/Plas:Qn:: 1.6

## 2019-10-04 LAB — PHOSPHORUS, SERUM: Phosphate:MCnc:Pt:Ser/Plas:Qn:: 3.8

## 2019-10-04 LAB — GAMMA GLUTAMYL TRANSFERASE: Gamma glutamyl transferase:CCnc:Pt:Ser/Plas:Qn:: 46

## 2019-10-04 LAB — BILIRUBIN DIRECT: Bilirubin.glucuronidated+Bilirubin.albumin bound:MCnc:Pt:Ser/Plas:Qn:: 0.11

## 2019-10-07 LAB — TACROLIMUS BLOOD: Tacrolimus:MCnc:Pt:Bld:Qn:LC/MS/MS: 5.6

## 2019-10-09 DIAGNOSIS — E612 Magnesium deficiency: Principal | ICD-10-CM

## 2019-10-09 DIAGNOSIS — Z944 Liver transplant status: Principal | ICD-10-CM

## 2019-10-09 DIAGNOSIS — Z5181 Encounter for therapeutic drug level monitoring: Principal | ICD-10-CM

## 2019-10-16 DIAGNOSIS — E612 Magnesium deficiency: Principal | ICD-10-CM

## 2019-10-16 DIAGNOSIS — Z5181 Encounter for therapeutic drug level monitoring: Principal | ICD-10-CM

## 2019-10-16 DIAGNOSIS — Z944 Liver transplant status: Principal | ICD-10-CM

## 2019-10-17 NOTE — Unmapped (Signed)
[facility] Refill Coordination Note    Specialty Medication(s) to be Shipped:   Transplant:  mycophenolic acid 180mg     Other medication(s) to be shipped: No additional medications requested for fill at this time     Anthony Alvarado, DOB: 1960/11/29  Phone: [phone] (home)       All above HIPAA information was verified with patient.     Was a Nurse, learning disability used for this call? No    Completed refill call assessment today to schedule patient's medication shipment from the [facility] [phone]).       Specialty medication(s) and dose(s) confirmed: Regimen is correct and unchanged.   Changes to medications: [name] reports no changes at this time.  Changes to insurance: No  Questions for the pharmacist: No    Confirmed patient received Welcome Packet with first shipment. The patient will receive a drug information handout for each medication shipped and additional FDA Medication Guides as required.       DISEASE/MEDICATION-SPECIFIC INFORMATION        N/A    SPECIALTY MEDICATION ADHERENCE     Medication Adherence    Patient reported X missed doses in the last month: 0      mycophenolic acid 180mg : 5 days worth of medication on hand.            SHIPPING     Shipping address confirmed in Epic.     Delivery Scheduled: Yes, Expected medication delivery date: 10/19/19.     Medication will be delivered via UPS to the prescription address in Epic WAM.    Anthony Alvarado   [name] Specialty Technician

## 2019-10-18 MED FILL — MYCOPHENOLATE SODIUM 180 MG TABLET,DELAYED RELEASE: ORAL | 30 days supply | Qty: 60 | Fill #1

## 2019-10-18 MED FILL — MYCOPHENOLATE SODIUM 180 MG TABLET,DELAYED RELEASE: 30 days supply | Qty: 60 | Fill #1 | Status: AC

## 2019-10-22 NOTE — Unmapped (Signed)
Patient's 8/4 pft report reviewed by NP Martin-Velez who recommended f/u with cardiology.

## 2019-10-23 DIAGNOSIS — Z5181 Encounter for therapeutic drug level monitoring: Principal | ICD-10-CM

## 2019-10-23 DIAGNOSIS — Z944 Liver transplant status: Principal | ICD-10-CM

## 2019-10-23 DIAGNOSIS — E612 Magnesium deficiency: Principal | ICD-10-CM

## 2019-10-30 DIAGNOSIS — E612 Magnesium deficiency: Principal | ICD-10-CM

## 2019-10-30 DIAGNOSIS — Z944 Liver transplant status: Principal | ICD-10-CM

## 2019-10-30 DIAGNOSIS — Z5181 Encounter for therapeutic drug level monitoring: Principal | ICD-10-CM

## 2019-10-31 NOTE — Unmapped (Signed)
Patient sent message to his primary coordinator:  ----- Message -----       From:Chisom Trudi Ida       Sent:10/31/2019 11:50 AM EDT         ZO:XWRUE Veva Holes, RN    Subject:Non-Urgent Medical Question    I WAS WONDERING IF I SHOULD GET MY TETANUS SHOT,I HAVE AN UPCOMING VISIT WITH MY PRIMARY CARE DOCTOR.    Sent message to patient:  Hi.   Vernona Rieger will return tomorrow.     Receiving a tetanus shot would be fine for you to receive. Our vaccine record shows that you received the last one October 09, 2008.     Our transplant providers are encouraging patients to receive the 3rd COVID vaccine as it was approved in immunocompromised patients. Our records show that you received the Pfizer vaccine previously. So, the 3rd vaccine/booster would be Pfizer as well. The 3rd one just has to be 28 days after the second vaccine.     Take care,  Wende Mott liver

## 2019-11-01 ENCOUNTER — Other Ambulatory Visit: Payer: Self-pay | Admitting: Family Medicine

## 2019-11-01 DIAGNOSIS — E612 Magnesium deficiency: Secondary | ICD-10-CM | POA: Diagnosis not present

## 2019-11-01 DIAGNOSIS — Z5181 Encounter for therapeutic drug level monitoring: Secondary | ICD-10-CM | POA: Diagnosis not present

## 2019-11-01 DIAGNOSIS — E119 Type 2 diabetes mellitus without complications: Secondary | ICD-10-CM

## 2019-11-01 DIAGNOSIS — Z944 Liver transplant status: Secondary | ICD-10-CM | POA: Diagnosis not present

## 2019-11-02 LAB — COMPREHENSIVE METABOLIC PANEL
A/G RATIO: 2.1 (ref 1.2–2.2)
ALBUMIN: 4.7 g/dL (ref 3.8–4.9)
ALKALINE PHOSPHATASE: 81 IU/L (ref 48–121)
ALT (SGPT): 51 IU/L — ABNORMAL HIGH (ref 0–44)
BILIRUBIN TOTAL: 0.3 mg/dL (ref 0.0–1.2)
BLOOD UREA NITROGEN: 23 mg/dL (ref 6–24)
BUN / CREAT RATIO: 21 — ABNORMAL HIGH (ref 9–20)
CALCIUM: 9.1 mg/dL (ref 8.7–10.2)
CO2: 21 mmol/L (ref 20–29)
CREATININE: 1.11 mg/dL (ref 0.76–1.27)
GFR MDRD AF AMER: 85 mL/min/{1.73_m2}
GFR MDRD NON AF AMER: 73 mL/min/{1.73_m2}
GLOBULIN, TOTAL: 2.2 g/dL (ref 1.5–4.5)
GLUCOSE: 117 mg/dL — ABNORMAL HIGH (ref 65–99)
POTASSIUM: 5 mmol/L (ref 3.5–5.2)
SODIUM: 138 mmol/L (ref 134–144)
TOTAL PROTEIN: 6.9 g/dL (ref 6.0–8.5)

## 2019-11-02 LAB — CBC W/ DIFFERENTIAL
BANDED NEUTROPHILS ABSOLUTE COUNT: 0.1 10*3/uL (ref 0.0–0.1)
BASOPHILS ABSOLUTE COUNT: 0.1 10*3/uL (ref 0.0–0.2)
BASOPHILS RELATIVE PERCENT: 1 %
EOSINOPHILS ABSOLUTE COUNT: 0.3 10*3/uL (ref 0.0–0.4)
EOSINOPHILS RELATIVE PERCENT: 4 %
HEMATOCRIT: 42.1 % (ref 37.5–51.0)
IMMATURE GRANULOCYTES: 1 %
LYMPHOCYTES ABSOLUTE COUNT: 2 10*3/uL (ref 0.7–3.1)
LYMPHOCYTES RELATIVE PERCENT: 24 %
MEAN CORPUSCULAR HEMOGLOBIN CONC: 34.2 g/dL (ref 31.5–35.7)
MEAN CORPUSCULAR HEMOGLOBIN: 30.1 pg (ref 26.6–33.0)
MEAN CORPUSCULAR VOLUME: 88 fL (ref 79–97)
MONOCYTES RELATIVE PERCENT: 11 %
NEUTROPHILS ABSOLUTE COUNT: 5 10*3/uL (ref 1.4–7.0)
NEUTROPHILS RELATIVE PERCENT: 59 %
PLATELET COUNT: 191 10*3/uL (ref 150–450)
RED CELL DISTRIBUTION WIDTH: 13.3 % (ref 11.6–15.4)
WHITE BLOOD CELL COUNT: 8.3 10*3/uL (ref 3.4–10.8)

## 2019-11-02 LAB — ALBUMIN: Albumin:MCnc:Pt:Ser/Plas:Qn:: 4.7

## 2019-11-02 LAB — GAMMA GLUTAMYL TRANSFERASE: Gamma glutamyl transferase:CCnc:Pt:Ser/Plas:Qn:: 38

## 2019-11-02 LAB — BILIRUBIN DIRECT: Bilirubin.glucuronidated+Bilirubin.albumin bound:MCnc:Pt:Ser/Plas:Qn:: 0.12

## 2019-11-02 LAB — BASOPHILS RELATIVE PERCENT: Basophils/100 leukocytes:NFr:Pt:Bld:Qn:Automated count: 1

## 2019-11-02 LAB — MAGNESIUM: Magnesium:MCnc:Pt:Ser/Plas:Qn:: 1.8

## 2019-11-02 LAB — PHOSPHORUS, SERUM: Phosphate:MCnc:Pt:Ser/Plas:Qn:: 3

## 2019-11-02 LAB — TACROLIMUS BLOOD: Tacrolimus:MCnc:Pt:Bld:Qn:LC/MS/MS: 5.4

## 2019-11-03 ENCOUNTER — Other Ambulatory Visit (INDEPENDENT_AMBULATORY_CARE_PROVIDER_SITE_OTHER): Payer: BC Managed Care – PPO

## 2019-11-03 ENCOUNTER — Other Ambulatory Visit: Payer: Self-pay

## 2019-11-03 DIAGNOSIS — E119 Type 2 diabetes mellitus without complications: Secondary | ICD-10-CM | POA: Diagnosis not present

## 2019-11-03 LAB — COMPREHENSIVE METABOLIC PANEL
ALT: 45 U/L (ref 0–53)
AST: 27 U/L (ref 0–37)
Albumin: 4.7 g/dL (ref 3.5–5.2)
Alkaline Phosphatase: 76 U/L (ref 39–117)
BUN: 23 mg/dL (ref 6–23)
CO2: 27 mEq/L (ref 19–32)
Calcium: 9.4 mg/dL (ref 8.4–10.5)
Chloride: 103 mEq/L (ref 96–112)
Creatinine, Ser: 1.08 mg/dL (ref 0.40–1.50)
GFR: 70.37 mL/min (ref >60.00)
Glucose, Bld: 119 mg/dL — ABNORMAL HIGH (ref 70–99)
Potassium: 4.7 mEq/L (ref 3.5–5.1)
Sodium: 136 mEq/L (ref 135–145)
Total Bilirubin: 0.4 mg/dL (ref 0.2–1.2)
Total Protein: 7.1 g/dL (ref 6.0–8.3)

## 2019-11-03 LAB — CBC WITH DIFFERENTIAL/PLATELET
Basophils Absolute: 0.1 K/uL (ref 0.0–0.1)
Basophils Relative: 1.1 % (ref 0.0–3.0)
Eosinophils Absolute: 0.3 K/uL (ref 0.0–0.7)
Eosinophils Relative: 3.7 % (ref 0.0–5.0)
HCT: 43 % (ref 39.0–52.0)
Hemoglobin: 14.4 g/dL (ref 13.0–17.0)
Lymphocytes Relative: 23.6 % (ref 12.0–46.0)
Lymphs Abs: 2 K/uL (ref 0.7–4.0)
MCHC: 33.5 g/dL (ref 30.0–36.0)
MCV: 87.8 fl (ref 78.0–100.0)
Monocytes Absolute: 0.8 K/uL (ref 0.1–1.0)
Monocytes Relative: 9.4 % (ref 3.0–12.0)
Neutro Abs: 5.3 K/uL (ref 1.4–7.7)
Neutrophils Relative %: 62.2 % (ref 43.0–77.0)
Platelets: 199 K/uL (ref 150.0–400.0)
RBC: 4.9 Mil/uL (ref 4.22–5.81)
RDW: 13.4 % (ref 11.5–15.5)
WBC: 8.6 K/uL (ref 4.0–10.5)

## 2019-11-03 LAB — MICROALBUMIN / CREATININE URINE RATIO
Creatinine,U: 177.7 mg/dL
Microalb Creat Ratio: 2.1 mg/g (ref 0.0–30.0)
Microalb, Ur: 3.7 mg/dL — ABNORMAL HIGH (ref 0.0–1.9)

## 2019-11-03 LAB — LIPID PANEL
Cholesterol: 75 mg/dL (ref 0–200)
HDL: 30.2 mg/dL — ABNORMAL LOW (ref >39.00)
NonHDL: 44.51
Total CHOL/HDL Ratio: 2
Triglycerides: 202 mg/dL — ABNORMAL HIGH (ref 0.0–149.0)
VLDL: 40.4 mg/dL — ABNORMAL HIGH (ref 0.0–40.0)

## 2019-11-03 LAB — HEMOGLOBIN A1C: Hgb A1c MFr Bld: 6.4 % (ref 4.6–6.5)

## 2019-11-03 LAB — TSH: TSH: 1.06 u[IU]/mL (ref 0.35–4.50)

## 2019-11-03 LAB — LDL CHOLESTEROL, DIRECT: Direct LDL: 25 mg/dL

## 2019-11-03 NOTE — Addendum Note (Signed)
Addended by: Ellamae Sia on: 11/03/2019 10:00 AM   Modules accepted: Orders

## 2019-11-06 DIAGNOSIS — E612 Magnesium deficiency: Principal | ICD-10-CM

## 2019-11-06 DIAGNOSIS — Z944 Liver transplant status: Principal | ICD-10-CM

## 2019-11-06 DIAGNOSIS — Z5181 Encounter for therapeutic drug level monitoring: Principal | ICD-10-CM

## 2019-11-06 MED FILL — SERTRALINE 50 MG TABLET: ORAL | 30 days supply | Qty: 30 | Fill #1

## 2019-11-06 MED FILL — AMLODIPINE 10 MG TABLET: ORAL | 30 days supply | Qty: 30 | Fill #4

## 2019-11-06 MED FILL — METFORMIN ER 500 MG TABLET,EXTENDED RELEASE 24 HR: 30 days supply | Qty: 120 | Fill #1 | Status: AC

## 2019-11-06 MED FILL — METFORMIN ER 500 MG TABLET,EXTENDED RELEASE 24 HR: ORAL | 30 days supply | Qty: 120 | Fill #1

## 2019-11-06 MED FILL — PRAVASTATIN 40 MG TABLET: ORAL | 90 days supply | Qty: 90 | Fill #2

## 2019-11-06 MED FILL — SERTRALINE 50 MG TABLET: 30 days supply | Qty: 30 | Fill #1 | Status: AC

## 2019-11-06 MED FILL — PRAVASTATIN 40 MG TABLET: 90 days supply | Qty: 90 | Fill #2 | Status: AC

## 2019-11-06 MED FILL — AMLODIPINE 10 MG TABLET: 30 days supply | Qty: 30 | Fill #4 | Status: AC

## 2019-11-06 NOTE — Unmapped (Signed)
[facility] Refill Coordination Note    Specialty Medication(s) to be Shipped:   Transplant: Envarsus 0.75mg mg, Envarsus 1mg  and  mycophenolic acid 180mg     Other medication(s) to be shipped: No additional medications requested for fill at this time     Anthony Alvarado, DOB: Feb 09, 1961  Phone: [phone] (home)       All above HIPAA information was verified with patient.     Was a Nurse, learning disability used for this call? No    Completed refill call assessment today to schedule patient's medication shipment from the [facility] [phone]).       Specialty medication(s) and dose(s) confirmed: Regimen is correct and unchanged.   Changes to medications: [name] reports no changes at this time.  Changes to insurance: No  Questions for the pharmacist: No    Confirmed patient received Welcome Packet with first shipment. The patient will receive a drug information handout for each medication shipped and additional FDA Medication Guides as required.       DISEASE/MEDICATION-SPECIFIC INFORMATION        N/A    SPECIALTY MEDICATION ADHERENCE     Medication Adherence    Patient reported X missed doses in the last month: 0  Specialty Medication: Envarsus 0.75mg   Patient is on additional specialty medications: Yes  Additional Specialty Medications: Envarsus 1mg   Patient Reported Additional Medication X Missed Doses in the Last Month: 0  Patient is on more than two specialty medications: Yes  Specialty Medication: Mycophenolate 180mg   Patient Reported Additional Medication X Missed Doses in the Last Month: 0  Informant: patient                Envarsus 0.75 mg: 14 days of medicine on hand   Envarsus 1 mg: 14 days of medicine on hand   Mycophenolate 180 mg: 14 days of medicine on hand         SHIPPING     Shipping address confirmed in Epic.     Delivery Scheduled: Yes, Expected medication delivery date: 11/14/19.     Medication will be delivered via UPS to the prescription address in Epic WAM.    Jasper Loser   [facility] Specialty Technician

## 2019-11-07 ENCOUNTER — Encounter: Payer: BC Managed Care – PPO | Admitting: Family Medicine

## 2019-11-10 ENCOUNTER — Ambulatory Visit (INDEPENDENT_AMBULATORY_CARE_PROVIDER_SITE_OTHER): Payer: BC Managed Care – PPO | Admitting: Family Medicine

## 2019-11-10 ENCOUNTER — Other Ambulatory Visit: Payer: Self-pay

## 2019-11-10 ENCOUNTER — Encounter: Payer: Self-pay | Admitting: Family Medicine

## 2019-11-10 VITALS — BP 126/82 | HR 71 | Temp 97.5°F | Ht 68.0 in | Wt 205.5 lb

## 2019-11-10 DIAGNOSIS — Z0000 Encounter for general adult medical examination without abnormal findings: Secondary | ICD-10-CM

## 2019-11-10 DIAGNOSIS — E119 Type 2 diabetes mellitus without complications: Secondary | ICD-10-CM

## 2019-11-10 DIAGNOSIS — E785 Hyperlipidemia, unspecified: Secondary | ICD-10-CM

## 2019-11-10 DIAGNOSIS — Z23 Encounter for immunization: Secondary | ICD-10-CM | POA: Diagnosis not present

## 2019-11-10 DIAGNOSIS — Z7189 Other specified counseling: Secondary | ICD-10-CM

## 2019-11-10 DIAGNOSIS — I10 Essential (primary) hypertension: Secondary | ICD-10-CM

## 2019-11-10 DIAGNOSIS — E039 Hypothyroidism, unspecified: Secondary | ICD-10-CM

## 2019-11-10 DIAGNOSIS — Z944 Liver transplant status: Secondary | ICD-10-CM

## 2019-11-10 MED ORDER — AMLODIPINE BESYLATE 10 MG PO TABS
5.0000 mg | ORAL_TABLET | Freq: Every day | ORAL | Status: DC
Start: 2019-11-10 — End: 2020-07-12

## 2019-11-10 MED ORDER — PRAVASTATIN SODIUM 40 MG PO TABS
20.0000 mg | ORAL_TABLET | Freq: Every day | ORAL | Status: DC
Start: 2019-11-10 — End: 2020-05-20

## 2019-11-10 MED ORDER — MYCOPHENOLATE SODIUM 180 MG PO TBEC
180.0000 mg | DELAYED_RELEASE_TABLET | Freq: Two times a day (BID) | ORAL | Status: DC
Start: 2019-11-10 — End: 2020-11-18

## 2019-11-10 NOTE — Patient Instructions (Addendum)
I would try cutting the pravastatin and the amlodipine in half.  See if the aches and the lightheadedness get better.  Either way, let me know.   Take care.  Glad to see you. I'll update [facility].  If you notice fluttering, check your pulse and see what you notice.   Flu and tetanus shot today.

## 2019-11-10 NOTE — Progress Notes (Signed)
This visit occurred during the SARS-CoV-2 public health emergency.  Safety protocols were in place, including screening questions prior to the visit, additional usage of staff PPE, and extensive cleaning of exam room while observing appropriate contact time as indicated for disinfecting solutions.  CPE- See plan.  Routine anticipatory guidance given to patient.  See health maintenance.  The possibility exists that previously documented standard health maintenance information may have been brought forward from a previous encounter into this note.  If needed, that same information has been updated to reflect the current situation based on today's encounter.    Tetanus 2021 Flu shot 2021 Shingles and PNA shot not due yet. D/w pt.  FH prostate cancer- PSA screening d/w pt.  I will see about getting done with next set of labs.   FH colon cancer. Colonoscopy wnl 2014 Living will d/w pt. Wife and his daughter Marye Round would be designated equally.  HCV and HIV screening prev done.  DM.  On metformin with A1c 6.4.  Sugar otherwise 100s.  MALB positive.    S/p liver transplant.  He still has some SOB with exertion, walking distance.  He had pulmonary eval with [facility] already.  He has cardiology f/u pending.  He has rare limited fluttering in the chest, not sustained.  No juandice. No fevers.   LFTs wnl. He has altered sensation on abd skin after surgery.    He has some L jaw pain.  Minimal cerumen on L side, B TMs wnl.  D/w pt about trying icing.    Hypertension:  Using medication without problems or lightheadedness: occ lightheaded.   Chest pain with exertion:no Edema:no Short of breath: see above.    Elevated Cholesterol: Using medications without problems: yes Muscle aches: some aches in the AM but better as the day goes on.   Diet compliance: yes Exercise: yes  He still has occ RLS sx at night.  Normal HGB and TSH.    PMH and SH reviewed  Meds, vitals, and allergies reviewed.   ROS:  Per HPI.  Unless specifically indicated otherwise in HPI, the patient denies:  General: fever. Eyes: acute vision changes ENT: sore throat Cardiovascular: chest pain Respiratory: SOB GI: vomiting GU: dysuria Musculoskeletal: acute back pain Derm: acute rash Neuro: acute motor dysfunction Psych: worsening mood Endocrine: polydipsia Heme: bleeding Allergy: hayfever  GEN: nad, alert and oriented HEENT: ncat, tympanic membranes within normal limits bilaterally.  Temporal area not tender to palpation.  Normal TMJ range of motion. NECK: supple w/o LA CV: rrr. Soft SEM noted.   PULM: ctab, no inc wob ABD: soft, +bs EXT: no edema SKIN: no acute rash

## 2019-11-12 ENCOUNTER — Telehealth: Payer: Self-pay | Admitting: Family Medicine

## 2019-11-12 DIAGNOSIS — E785 Hyperlipidemia, unspecified: Secondary | ICD-10-CM | POA: Insufficient documentation

## 2019-11-12 NOTE — Assessment & Plan Note (Signed)
He will hold pravastatin and see if the aches get better.  See after visit summary.

## 2019-11-12 NOTE — Assessment & Plan Note (Addendum)
He still has some SOB with exertion, walking distance.  He had pulmonary eval with [facility] already.  He has cardiology f/u pending.  He has rare limited fluttering in the chest, not sustained.  No juandice. No fevers.   LFTs wnl. He has altered sensation on abd skin after surgery.   He is on antirejection medications per [facility].  I will defer.  He agrees.

## 2019-11-12 NOTE — Assessment & Plan Note (Signed)
Continue levothyroxine.  TSH normal. ?

## 2019-11-12 NOTE — Assessment & Plan Note (Signed)
Living will d/w pt.  Wife and his daughter Brittany would be designated equally.   

## 2019-11-12 NOTE — Telephone Encounter (Signed)
Please check with [facility] liver transplant clinic.  See if they are willing to get a PSA with his next set of labs there.  If not then let me know and I can order it for his next set of labs here.  Thanks.

## 2019-11-12 NOTE — Assessment & Plan Note (Signed)
Occasionally lightheaded, he will cut amlodipine in half and see if he feels better with that.  See after visit summary.

## 2019-11-12 NOTE — Assessment & Plan Note (Addendum)
On metformin with A1c 6.4.  Sugar otherwise 100s.  MALB positive.   Defer starting ACE inhibitor at this point as we need to taper his amlodipine.  See after visit summary.

## 2019-11-12 NOTE — Assessment & Plan Note (Signed)
Tetanus 2021 Flu shot 2021 Shingles and PNA shot not due yet. D/w pt.  FH prostate cancer- PSA screening d/w pt.  I will see about getting done with next set of labs.   FH colon cancer. Colonoscopy wnl 2014 Living will d/w pt. Wife and his daughter Marye Round would be designated equally.  HCV and HIV screening prev done.

## 2019-11-13 ENCOUNTER — Encounter: Payer: Self-pay | Admitting: Family Medicine

## 2019-11-13 DIAGNOSIS — Z5181 Encounter for therapeutic drug level monitoring: Principal | ICD-10-CM

## 2019-11-13 DIAGNOSIS — E612 Magnesium deficiency: Principal | ICD-10-CM

## 2019-11-13 DIAGNOSIS — Z944 Liver transplant status: Principal | ICD-10-CM

## 2019-11-13 DIAGNOSIS — Z125 Encounter for screening for malignant neoplasm of prostate: Principal | ICD-10-CM

## 2019-11-13 MED FILL — ENVARSUS XR 0.75 MG TABLET,EXTENDED RELEASE: 30 days supply | Qty: 30 | Fill #3 | Status: AC

## 2019-11-13 MED FILL — ENVARSUS XR 1 MG TABLET,EXTENDED RELEASE: 30 days supply | Qty: 30 | Fill #3 | Status: AC

## 2019-11-13 MED FILL — MYCOPHENOLATE SODIUM 180 MG TABLET,DELAYED RELEASE: 30 days supply | Qty: 60 | Fill #2 | Status: AC

## 2019-11-13 MED FILL — MYCOPHENOLATE SODIUM 180 MG TABLET,DELAYED RELEASE: ORAL | 30 days supply | Qty: 60 | Fill #2

## 2019-11-13 MED FILL — ENVARSUS XR 1 MG TABLET,EXTENDED RELEASE: ORAL | 30 days supply | Qty: 30 | Fill #3

## 2019-11-13 MED FILL — ENVARSUS XR 0.75 MG TABLET,EXTENDED RELEASE: ORAL | 30 days supply | Qty: 30 | Fill #3

## 2019-11-13 NOTE — Telephone Encounter (Addendum)
Noted.  Thanks.  Message sent to patient.

## 2019-11-13 NOTE — Unmapped (Addendum)
Patient's pcp's office, Dr.Duncan, left VM requesting we add PSA to patient's next lab set for them. NP Martin-Velez approved. Added lab and spoke to patient, instructing him to request the PSA along with his txp labs when he goes to [facility] next. He verbalized understanding.

## 2019-11-13 NOTE — Telephone Encounter (Signed)
Mickel Baas returned call.  Mickel Baas said they will get a PSA.

## 2019-11-13 NOTE — Telephone Encounter (Addendum)
Left detailed message on voicemail of Harless Nakayama, transplant coordinator for Mr. Swoyer.  [phone]

## 2019-11-16 NOTE — Progress Notes (Signed)
Cardiology Office Note   Date:  11/17/2019   ID:  [name], DOB 01/10/1961, MRN [id number]  PCP:  [name], MD  Cardiologist:   Dorris Carnes, MD   F/U of aortic stenosis.     History of Present Illness: [name] is a 59 y.o. male with a history of aortic stenosis, cirrhosis, HTN.[name]   As pretransplant evaluation in 2019 L heart cath  done   [facility] showed 40% D1, 30 to 40% D2; 40 to 50% OM2   LVEF normal   Mild AS       Echo in July 2020 showed Peak and mean gradients through the AV were 71 and 37 mm Hg   AVA 1.3cm2.  A limited echo in Nov 2020 showed no effusion.  I saw the pt in clinic in march 2020 Since seen he denies CP   Says he gets SOB with activity   Walking in from parking lot.  No PND   No LE edemaa  Rare dizziness Does not occasional fluttering   Not associated with dizziness   Spells last about 30 seconds.    Note he was seen in Tx clinic at [place] up for spirometry which he had in Aug 2021    FEV1 was 2.59 L   FEF 25 to 75  1.71 L/s    Current Meds  Medication Sig  . acetaminophen (TYLENOL) 325 MG tablet Take 325 mg by mouth every 6 (six) hours as needed.  [name] alendronate (FOSAMAX) 70 MG tablet Take 70 mg by mouth once a week. Take with a full glass of water on an empty stomach.  [name] amLODipine (NORVASC) 10 MG tablet Take 0.5 tablets (5 mg total) by mouth daily.  [name] aspirin EC 81 MG tablet Take 81 mg by mouth daily.  [name] azelastine (ASTELIN) 0.1 % nasal spray PLACE 2 SPRAYS INTO BOTH NOSTRILS TWICE DAILY AS NEEDED  . calcium carbonate (TUMS - DOSED IN MG ELEMENTAL CALCIUM) 500 MG chewable tablet Chew 1 tablet by mouth daily.  . cholecalciferol (VITAMIN D) 25 MCG (1000 UT) tablet Take 1,000 Units by mouth 2 (two) times daily.  . fluticasone (FLONASE) 50 MCG/ACT nasal spray PLACE ONE OR TWO SPRAYS INTO BOTH NOSTRILS DAILY AS NEEDED.  . furosemide (LASIX) 20 MG tablet Take 40 mg by mouth as needed.   [name] levothyroxine (SYNTHROID) 150 MCG tablet TAKE 1 TABLET DAILY  EXCEPT FOR 1.5 TABLETS ON SUNDAYS.  [name] metFORMIN (GLUCOPHAGE-XR) 500 MG 24 hr tablet Take 1,000 mg by mouth in the morning and at bedtime.  . metroNIDAZOLE (METROGEL) 1 % gel Apply topically daily.  . mycophenolate (MYFORTIC) 180 MG EC tablet Take 1 tablet (180 mg total) by mouth 2 (two) times daily.  [name] Lancets 95A MISC USE TO CHECK SUGAR DAILY  . ONETOUCH ULTRA test strip USE TO CHECK SUGAR DAILY  . pravastatin (PRAVACHOL) 40 MG tablet Take 0.5 tablets (20 mg total) by mouth daily.  . sertraline (ZOLOFT) 50 MG tablet Take 50 mg by mouth daily.  . Tacrolimus ER (ENVARSUS XR) 0.75 MG TB24 Take 1 tablet by mouth. Total of 1.75mg  a day  . Tacrolimus ER (ENVARSUS XR) 1 MG TB24 Take 1 tablet by mouth. Total of 1.75mg  a day  . Testosterone (ANDROGEL TD) Place onto the skin. Apply 5 grams daily     Allergies:   Watermelon flavor   Past Medical History:  Diagnosis Date  . Allergy   .  Asthma   . Bicuspid aortic valve    sees dr Harrington Challenger  . Cirrhosis ([place])   . Diabetes mellitus without complication ([place])   . Fatty liver    with h/o elevated LFT's  . GERD (gastroesophageal reflux disease)   . Heart murmur   . Hypertension   . Itching    all over last few months  . Jaundice   . Liver transplant recipient [facility])    09/16/2018 at [facility]  . Migraine with aura   . OSA (obstructive sleep apnea) 09/14/2011   PSG 11/08/11>>AHI 31.6, SpO2 low 85%. wears CPAP, pt does not know settings  . Other abnormal glucose    diet controlled diabetic  . Thyroid disease     Past Surgical History:  Procedure Laterality Date  . APPENDECTOMY  10/09   Emergency  . BIOPSY THYROID  08/19/07   Attempted, no tissue obtained  . CARDIAC CATHETERIZATION  03/21/2018  . CARDIOVASCULAR STRESS TEST  03/07   Negative 06/05  . DOPPLER ECHOCARDIOGRAPHY  06/04  . ESOPHAGEAL BANDING N/A 05/04/2016   Procedure: ESOPHAGEAL BANDING;  Surgeon: [name], MD;  Location: [facility];  Service: Endoscopy;   Laterality: N/A;  . ESOPHAGOGASTRODUODENOSCOPY (EGD) WITH PROPOFOL N/A 05/04/2016   Procedure: ESOPHAGOGASTRODUODENOSCOPY (EGD) WITH PROPOFOL;  Surgeon: [name], MD;  Location: [facility];  Service: Endoscopy;  Laterality: N/A;  . LIVER TRANSPLANT     09/16/2018 at [place] History:  The patient  reports that he has never smoked. He has never used smokeless tobacco. He reports that he does not drink alcohol and does not use drugs.   Family History:  The patient's family history includes Asthma in his mother; COPD in his father; Cancer in his father; Colon cancer in his maternal grandmother; Heart disease in his maternal grandfather and sister; Liver disease in his sister; Prostate cancer in his maternal uncle.    ROS:  Please see the history of present illness. All other systems are reviewed and  Negative to the above problem except as noted.    PHYSICAL EXAM: VS:  BP (!) 142/78   Pulse 76   Ht 5\' 8"  (1.727 m)   Wt 212 lb 12.8 oz (96.5 kg)   SpO2 98%   BMI 32.36 kg/m   GEN: Well nourished, well developed, in no acute distress  Neck: JVP not elevated   No carotid bruits Cardiac: RRR  Gr III/VI systolic murmur base  No LE  edema  Respiratory:  clear to auscultation bilaterally,  GI: soft, nontender, nondistended, + BS  No hepatomegaly  MS: no deformity Moving all extremities   Skin: warm and dry, no rash  Neuro:  Strength and sensation are intact Psych: euthymic mood, full affect   EKG:  EKG is  ordered today.    SR 76 bpm  LVH.  Nonspecific ST changes    Compared to echo repot from 2019, mean gradient through AV is decreased but LVOT/AV VTI ratio is the same consistent with moderate to severe AS. 2. Left ventricular ejection fraction, by estimation, is 55 to 60%. The left ventricle has normal function. The left ventricle demonstrates regional wall motion abnormalities (see scoring diagram/findings for description). There is moderate left ventricular hypertrophy.  Left ventricular diastolic parameters are consistent with Grade I diastolic dysfunction (impaired relaxation). 3. Right ventricular systolic function is normal. The right ventricular size is normal. 4. The mitral valve is normal in structure. Trivial mitral valve regurgitation. 5. AV  is thickened, calcified with restricted motion. Peak and mean gradients through the valve are 38 adn 25 mm Hg respectively. LVOT / AV VTI ratio is 9.28 consistent with moderate to severe AS. [name] The aortic valve is abnormal. Aortic valve regurgitation is not visualized. Moderate to severe aortic valve stenosis. 6. The inferior vena cava is normal in size with greater than 50% respiratory variability, suggesting right atrial pressure of 3 mmHg.   Lipid Panel    Component Value Date/Time   CHOL 75 11/03/2019 0849   TRIG 202.0 (H) 11/03/2019 0849   HDL 30.20 (L) 11/03/2019 0849   CHOLHDL 2 11/03/2019 0849   VLDL 40.4 (H) 11/03/2019 0849   LDLCALC 131 (H) 01/11/2017 0821   LDLDIRECT 25.0 11/03/2019 0849      Wt Readings from Last 3 Encounters:  11/17/19 212 lb 12.8 oz (96.5 kg)  11/10/19 205 lb 8 oz (93.2 kg)  04/28/19 193 lb (87.5 kg)      ASSESSMENT AND PLAN:  1   Dypsnea  Pt symptomatic with dyspnea with walking   Volume status on exam appears OK   Echo with mod to severe AS   Mean gradient not severely elevated     Note he did have COVID infection   ? If this represents pulm residua.  Spirometry OK but DLCO not done    Will review echoes  2Aortic stenosis  As noted above for dyspnea  3  CAD   Mild at cath in 2019   Follow   I do not think dyspnea is anginal equivalent  Will review echoes   4  Lipids  LDL 25   Continue statin              Current medicines are reviewed at length with the patient today.  The patient does not have concerns regarding medicines.  Signed, Dorris Carnes, MD  11/17/2019 10:18 PM    [address], [place], [place]   [address] Phone: [phone]; Fax: [phone]

## 2019-11-17 ENCOUNTER — Encounter: Payer: Self-pay | Admitting: Internal Medicine

## 2019-11-17 ENCOUNTER — Other Ambulatory Visit: Payer: Self-pay

## 2019-11-17 ENCOUNTER — Ambulatory Visit: Payer: BC Managed Care – PPO | Admitting: Internal Medicine

## 2019-11-17 VITALS — BP 142/78 | HR 76 | Ht 68.0 in | Wt 212.8 lb

## 2019-11-17 DIAGNOSIS — I350 Nonrheumatic aortic (valve) stenosis: Secondary | ICD-10-CM

## 2019-11-17 NOTE — Patient Instructions (Signed)
Medication Instructions:  No changes *If you need a refill on your cardiac medications before your next appointment, please call your pharmacy*   Lab Work: No changes If you have labs (blood work) drawn today and your tests are completely normal, you will receive your results only by: [name] MyChart Message (if you have MyChart) OR . A paper copy in the mail If you have any lab test that is abnormal or we need to change your treatment, we will call you to review the results.   Testing/Procedures: none   Follow-Up: To be determined after [name] reviews with pulmonary and the valve team.  Other Instructions

## 2019-11-20 DIAGNOSIS — Z944 Liver transplant status: Principal | ICD-10-CM

## 2019-11-20 DIAGNOSIS — Z5181 Encounter for therapeutic drug level monitoring: Principal | ICD-10-CM

## 2019-11-20 DIAGNOSIS — E612 Magnesium deficiency: Principal | ICD-10-CM

## 2019-11-23 DIAGNOSIS — G4733 Obstructive sleep apnea (adult) (pediatric): Secondary | ICD-10-CM | POA: Diagnosis not present

## 2019-11-27 DIAGNOSIS — Z944 Liver transplant status: Principal | ICD-10-CM

## 2019-11-27 DIAGNOSIS — E612 Magnesium deficiency: Principal | ICD-10-CM

## 2019-11-27 DIAGNOSIS — Z5181 Encounter for therapeutic drug level monitoring: Principal | ICD-10-CM

## 2019-12-04 DIAGNOSIS — E612 Magnesium deficiency: Principal | ICD-10-CM

## 2019-12-04 DIAGNOSIS — Z944 Liver transplant status: Principal | ICD-10-CM

## 2019-12-04 DIAGNOSIS — Z5181 Encounter for therapeutic drug level monitoring: Principal | ICD-10-CM

## 2019-12-04 MED FILL — METFORMIN ER 500 MG TABLET,EXTENDED RELEASE 24 HR: 30 days supply | Qty: 120 | Fill #2 | Status: AC

## 2019-12-04 MED FILL — ALENDRONATE 70 MG TABLET: ORAL | 28 days supply | Qty: 4 | Fill #11

## 2019-12-04 MED FILL — AMLODIPINE 10 MG TABLET: 30 days supply | Qty: 30 | Fill #5 | Status: AC

## 2019-12-04 MED FILL — SERTRALINE 50 MG TABLET: ORAL | 30 days supply | Qty: 30 | Fill #2

## 2019-12-04 MED FILL — METFORMIN ER 500 MG TABLET,EXTENDED RELEASE 24 HR: ORAL | 30 days supply | Qty: 120 | Fill #2

## 2019-12-04 MED FILL — SERTRALINE 50 MG TABLET: 30 days supply | Qty: 30 | Fill #2 | Status: AC

## 2019-12-04 MED FILL — AMLODIPINE 10 MG TABLET: ORAL | 30 days supply | Qty: 30 | Fill #5

## 2019-12-04 MED FILL — ALENDRONATE 70 MG TABLET: 28 days supply | Qty: 4 | Fill #11 | Status: AC

## 2019-12-04 NOTE — Unmapped (Signed)
[facility] Clinical Assessment & Refill Coordination Note    Anthony Alvarado, DOB: 11-02-1960  Phone: [phone] (home)     All above HIPAA information was verified with patient.     Was a Nurse, learning disability used for this call? No    Specialty Medication(s):   Transplant: Envarsus 1mg , Envarsus 0.75mg  and  mycophenolic acid 180mg      Current Outpatient Medications   Medication Sig Dispense Refill   ??? alendronate (FOSAMAX) 70 MG tablet Take 1 tablet (70 mg total) by mouth every seven (7) days. 4 tablet 11   ??? amLODIPine (NORVASC) 10 MG tablet Take 1 tablet (10 mg total) by mouth daily. 30 tablet 11   ??? aspirin (ECOTRIN) 81 MG tablet Take 1 tablet (81 mg total) by mouth daily. 30 tablet 11   ??? azelastine (ASTELIN) 137 mcg (0.1 %) nasal spray 1 spray by Each Nare route daily as needed.      ??? blood sugar diagnostic Strp Use as directed Three (3) times a day before meals. 90 each 11   ??? calcium carbonate (TUMS) 200 mg calcium (500 mg) chewable tablet Chew 1 tablet Three (3) times a day as needed for heartburn.      ??? cholecalciferol, vitamin D3, 1,000 unit (25 mcg) tablet Take 2 tablets (2,000 Units total) by mouth daily. 60 tablet 11   ??? ENVARSUS XR 1 mg Tb24 extended release tablet Take 1 tablet (1 mg total) by mouth daily with 1 (0.75 mg) tablet for a total dose of 1.75 mg daily 30 tablet 11   ??? fluticasone propionate (FLONASE) 50 mcg/actuation nasal spray PLACE ONE OR TWO SPRAYS INTO BOTH NOSTRILS DAILY AS NEEDED.     ??? lancets 33 gauge Misc 1 each by Miscellaneous route Three (3) times a day before meals. 100 each 11   ??? levothyroxine (SYNTHROID) 150 MCG tablet Take 1 tablet (150 mcg total) by mouth daily. 30 tablet 11   ??? metFORMIN (GLUCOPHAGE XR) 500 MG 24 hr tablet Take 2 tablets (1,000 mg total) by mouth two (2) times a day. 120 tablet 11   ??? mycophenolate (MYFORTIC) 180 MG EC tablet Take 1 tablet (180 mg total) by mouth Two (2) times a day. 60 tablet 11   ??? pravastatin (PRAVACHOL) 40 MG tablet Take 1 tablet (40 mg total) by mouth every evening. 90 tablet 3   ??? sertraline (ZOLOFT) 50 MG tablet Take 1 tablet (50 mg total) by mouth daily. 30 tablet 11   ??? ENVARSUS XR 0.75 mg Tb24 extended release tablet Take 1 tablet (0.75 mg total) by mouth daily with 1 (1 mg) tablet for a total dose of 1.75 mg daily. 30 tablet 11   ??? testosterone (ANDROGEL) 1 % (25 mg/2.5gram) GlPk Apply 5g (2 packet) to each shoulder/upper arm daily for a total daily dose of 10g. 120 packet 3     No current facility-administered medications for this visit.     Facility-Administered Medications Ordered in Other Visits   Medication Dose Route Frequency Provider Last Rate Last Admin   ??? diazePAM (VALIUM) 5 mg/mL injection                 Changes to medications: [name] reports no changes at this time.    Allergies   Allergen Reactions   ??? Watermelon Flavor      Mouth itching       Changes to allergies: No    SPECIALTY MEDICATION ADHERENCE  Envarsus Xr 1 mg: 13 days of medicine on hand   Envarsus Xr 0.75 mg: 13 days of medicine on hand   Mycophenolate 180 mg: 13 days of medicine on hand       Medication Adherence    Patient reported X missed doses in the last month: 0  Specialty Medication: Envarsus Xr 1mg   Patient is on additional specialty medications: Yes  Additional Specialty Medications: Evnarsus Xr 0.75mg   Patient Reported Additional Medication X Missed Doses in the Last Month: 0  Patient is on more than two specialty medications: Yes  Specialty Medication: Mycophenolate 180mg   Patient Reported Additional Medication X Missed Doses in the Last Month: 1          Specialty medication(s) dose(s) confirmed: Regimen is correct and unchanged.     Are there any concerns with adherence? No    Adherence counseling provided? Not needed    CLINICAL MANAGEMENT AND INTERVENTION      Clinical Benefit Assessment:    Do you feel the medicine is effective or helping your condition? Yes    Clinical Benefit counseling provided? Not needed    Adverse Effects Assessment:    Are you experiencing any side effects? No    Are you experiencing difficulty administering your medicine? No    Quality of Life Assessment:    How many days over the past month did your liver transplant  keep you from your normal activities? For example, brushing your teeth or getting up in the morning. 0    Have you discussed this with your provider? Not needed    Therapy Appropriateness:    Is therapy appropriate? Yes, therapy is appropriate and should be continued    DISEASE/MEDICATION-SPECIFIC INFORMATION      N/A    PATIENT SPECIFIC NEEDS     - Does the patient have any physical, cognitive, or cultural barriers? No    - Is the patient high risk? Yes, patient is taking a REMS drug. Medication is dispensed in compliance with REMS program    - Does the patient require a Care Management Plan? No     - Does the patient require physician intervention or other additional services (i.e. nutrition, smoking cessation, social work)? No      SHIPPING     Specialty Medication(s) to be Shipped:   Transplant: Envarsus 0.75mg , Envarsus 1mg  and  mycophenolic acid 180mg     Other medication(s) to be shipped: No additional medications requested for fill at this time     Changes to insurance: No    Delivery Scheduled: Yes, Expected medication delivery date: 12/13/19.     Medication will be delivered via UPS to the confirmed prescription address in [facility].    The patient will receive a drug information handout for each medication shipped and additional FDA Medication Guides as required.  Verified that patient has previously received a Conservation officer, historic buildings.    All of the patient's questions and concerns have been addressed.    [name]   [facility] Specialty Pharmacist

## 2019-12-05 DIAGNOSIS — E612 Magnesium deficiency: Secondary | ICD-10-CM | POA: Diagnosis not present

## 2019-12-05 DIAGNOSIS — Z944 Liver transplant status: Secondary | ICD-10-CM | POA: Diagnosis not present

## 2019-12-05 DIAGNOSIS — Z5181 Encounter for therapeutic drug level monitoring: Secondary | ICD-10-CM | POA: Diagnosis not present

## 2019-12-05 LAB — CBC W/ DIFFERENTIAL
BASOPHILS ABSOLUTE COUNT: 0.1 10*3/uL (ref 0.0–0.2)
BASOPHILS RELATIVE PERCENT: 1 %
EOSINOPHILS RELATIVE PERCENT: 5 %
HEMATOCRIT: 41.8 % (ref 37.5–51.0)
HEMOGLOBIN: 14.2 g/dL (ref 13.0–17.7)
IMMATURE GRANULOCYTES: 1 %
LYMPHOCYTES ABSOLUTE COUNT: 2.1 10*3/uL (ref 0.7–3.1)
LYMPHOCYTES RELATIVE PERCENT: 22 %
MEAN CORPUSCULAR HEMOGLOBIN CONC: 34 g/dL (ref 31.5–35.7)
MEAN CORPUSCULAR HEMOGLOBIN: 29.7 pg (ref 26.6–33.0)
MEAN CORPUSCULAR VOLUME: 87 fL (ref 79–97)
MONOCYTES ABSOLUTE COUNT: 0.8 10*3/uL (ref 0.1–0.9)
MONOCYTES RELATIVE PERCENT: 9 %
NEUTROPHILS ABSOLUTE COUNT: 5.8 10*3/uL (ref 1.4–7.0)
PLATELET COUNT: 223 10*3/uL (ref 150–450)
RED BLOOD CELL COUNT: 4.78 x10E6/uL (ref 4.14–5.80)
RED CELL DISTRIBUTION WIDTH: 13.1 % (ref 11.6–15.4)
WHITE BLOOD CELL COUNT: 9.3 10*3/uL (ref 3.4–10.8)

## 2019-12-05 LAB — BASOPHILS RELATIVE PERCENT: Basophils/100 leukocytes:NFr:Pt:Bld:Qn:Automated count: 1

## 2019-12-06 LAB — COMPREHENSIVE METABOLIC PANEL
ALBUMIN: 4.7 g/dL (ref 3.8–4.9)
ALKALINE PHOSPHATASE: 80 IU/L (ref 44–121)
ALT (SGPT): 55 IU/L — ABNORMAL HIGH (ref 0–44)
AST (SGOT): 38 IU/L (ref 0–40)
BILIRUBIN TOTAL: 0.6 mg/dL (ref 0.0–1.2)
BLOOD UREA NITROGEN: 21 mg/dL (ref 6–24)
BUN / CREAT RATIO: 20 (ref 9–20)
CALCIUM: 9.8 mg/dL (ref 8.7–10.2)
CHLORIDE: 97 mmol/L (ref 96–106)
CO2: 22 mmol/L (ref 20–29)
CREATININE: 1.05 mg/dL (ref 0.76–1.27)
GFR MDRD AF AMER: 91 mL/min/{1.73_m2}
GFR MDRD NON AF AMER: 78 mL/min/{1.73_m2}
GLOBULIN, TOTAL: 2.5 g/dL (ref 1.5–4.5)
GLUCOSE: 121 mg/dL — ABNORMAL HIGH (ref 65–99)
SODIUM: 134 mmol/L (ref 134–144)
TOTAL PROTEIN: 7.2 g/dL (ref 6.0–8.5)

## 2019-12-06 LAB — MAGNESIUM: Magnesium:MCnc:Pt:Ser/Plas:Qn:: 1.7

## 2019-12-06 LAB — PHOSPHORUS, SERUM: Phosphate:MCnc:Pt:Ser/Plas:Qn:: 3.7

## 2019-12-06 LAB — BILIRUBIN DIRECT: Bilirubin.glucuronidated+Bilirubin.albumin bound:MCnc:Pt:Ser/Plas:Qn:: 0.13

## 2019-12-06 LAB — TOTAL PROTEIN: Protein:MCnc:Pt:Ser/Plas:Qn:: 7.2

## 2019-12-06 LAB — GAMMA GLUTAMYL TRANSFERASE: Gamma glutamyl transferase:CCnc:Pt:Ser/Plas:Qn:: 56

## 2019-12-07 LAB — TACROLIMUS BLOOD: Tacrolimus:MCnc:Pt:Bld:Qn:LC/MS/MS: 7

## 2019-12-11 DIAGNOSIS — Z944 Liver transplant status: Principal | ICD-10-CM

## 2019-12-11 DIAGNOSIS — Z5181 Encounter for therapeutic drug level monitoring: Principal | ICD-10-CM

## 2019-12-11 DIAGNOSIS — E612 Magnesium deficiency: Principal | ICD-10-CM

## 2019-12-12 MED FILL — MYCOPHENOLATE SODIUM 180 MG TABLET,DELAYED RELEASE: 30 days supply | Qty: 60 | Fill #3 | Status: AC

## 2019-12-12 MED FILL — MYCOPHENOLATE SODIUM 180 MG TABLET,DELAYED RELEASE: ORAL | 30 days supply | Qty: 60 | Fill #3

## 2019-12-12 MED FILL — ENVARSUS XR 1 MG TABLET,EXTENDED RELEASE: ORAL | 30 days supply | Qty: 30 | Fill #4

## 2019-12-12 MED FILL — ENVARSUS XR 0.75 MG TABLET,EXTENDED RELEASE: ORAL | 30 days supply | Qty: 30 | Fill #4

## 2019-12-12 MED FILL — ENVARSUS XR 1 MG TABLET,EXTENDED RELEASE: 30 days supply | Qty: 30 | Fill #4 | Status: AC

## 2019-12-12 MED FILL — ENVARSUS XR 0.75 MG TABLET,EXTENDED RELEASE: 30 days supply | Qty: 30 | Fill #4 | Status: AC

## 2019-12-18 ENCOUNTER — Other Ambulatory Visit: Payer: Self-pay | Admitting: Family Medicine

## 2019-12-18 DIAGNOSIS — Z5181 Encounter for therapeutic drug level monitoring: Principal | ICD-10-CM

## 2019-12-18 DIAGNOSIS — E612 Magnesium deficiency: Principal | ICD-10-CM

## 2019-12-18 DIAGNOSIS — Z944 Liver transplant status: Principal | ICD-10-CM

## 2019-12-18 DIAGNOSIS — E039 Hypothyroidism, unspecified: Secondary | ICD-10-CM

## 2019-12-25 DIAGNOSIS — Z944 Liver transplant status: Principal | ICD-10-CM

## 2019-12-25 DIAGNOSIS — E612 Magnesium deficiency: Principal | ICD-10-CM

## 2019-12-25 DIAGNOSIS — Z5181 Encounter for therapeutic drug level monitoring: Principal | ICD-10-CM

## 2020-01-01 DIAGNOSIS — E612 Magnesium deficiency: Principal | ICD-10-CM

## 2020-01-01 DIAGNOSIS — Z944 Liver transplant status: Principal | ICD-10-CM

## 2020-01-01 DIAGNOSIS — Z5181 Encounter for therapeutic drug level monitoring: Principal | ICD-10-CM

## 2020-01-03 DIAGNOSIS — Z5181 Encounter for therapeutic drug level monitoring: Secondary | ICD-10-CM | POA: Diagnosis not present

## 2020-01-03 DIAGNOSIS — E612 Magnesium deficiency: Secondary | ICD-10-CM | POA: Diagnosis not present

## 2020-01-03 DIAGNOSIS — Z944 Liver transplant status: Secondary | ICD-10-CM | POA: Diagnosis not present

## 2020-01-04 LAB — COMPREHENSIVE METABOLIC PANEL
A/G RATIO: 1.9 (ref 1.2–2.2)
ALBUMIN: 4.7 g/dL (ref 3.8–4.9)
ALKALINE PHOSPHATASE: 83 IU/L (ref 44–121)
ALT (SGPT): 52 IU/L — ABNORMAL HIGH (ref 0–44)
AST (SGOT): 32 IU/L (ref 0–40)
BILIRUBIN TOTAL: 0.3 mg/dL (ref 0.0–1.2)
BLOOD UREA NITROGEN: 19 mg/dL (ref 6–24)
BUN / CREAT RATIO: 18 (ref 9–20)
CALCIUM: 9.4 mg/dL (ref 8.7–10.2)
CHLORIDE: 102 mmol/L (ref 96–106)
CO2: 22 mmol/L (ref 20–29)
CREATININE: 1.04 mg/dL (ref 0.76–1.27)
GFR MDRD AF AMER: 92 mL/min/{1.73_m2}
GFR MDRD NON AF AMER: 79 mL/min/{1.73_m2}
GLOBULIN, TOTAL: 2.5 g/dL (ref 1.5–4.5)
GLUCOSE: 125 mg/dL — ABNORMAL HIGH (ref 65–99)
POTASSIUM: 4.6 mmol/L (ref 3.5–5.2)
SODIUM: 138 mmol/L (ref 134–144)
TOTAL PROTEIN: 7.2 g/dL (ref 6.0–8.5)

## 2020-01-04 LAB — CBC W/ DIFFERENTIAL
BANDED NEUTROPHILS ABSOLUTE COUNT: 0.1 10*3/uL (ref 0.0–0.1)
BASOPHILS ABSOLUTE COUNT: 0.1 10*3/uL (ref 0.0–0.2)
BASOPHILS RELATIVE PERCENT: 1 %
EOSINOPHILS ABSOLUTE COUNT: 0.3 10*3/uL (ref 0.0–0.4)
EOSINOPHILS RELATIVE PERCENT: 4 %
HEMATOCRIT: 42.7 % (ref 37.5–51.0)
HEMOGLOBIN: 14.2 g/dL (ref 13.0–17.7)
IMMATURE GRANULOCYTES: 1 %
LYMPHOCYTES ABSOLUTE COUNT: 1.9 10*3/uL (ref 0.7–3.1)
LYMPHOCYTES RELATIVE PERCENT: 20 %
MEAN CORPUSCULAR HEMOGLOBIN CONC: 33.3 g/dL (ref 31.5–35.7)
MEAN CORPUSCULAR HEMOGLOBIN: 29.3 pg (ref 26.6–33.0)
MEAN CORPUSCULAR VOLUME: 88 fL (ref 79–97)
MONOCYTES ABSOLUTE COUNT: 0.9 10*3/uL (ref 0.1–0.9)
MONOCYTES RELATIVE PERCENT: 9 %
NEUTROPHILS ABSOLUTE COUNT: 6.3 10*3/uL (ref 1.4–7.0)
NEUTROPHILS RELATIVE PERCENT: 65 %
PLATELET COUNT: 223 10*3/uL (ref 150–450)
RED BLOOD CELL COUNT: 4.84 x10E6/uL (ref 4.14–5.80)
RED CELL DISTRIBUTION WIDTH: 13 % (ref 11.6–15.4)
WHITE BLOOD CELL COUNT: 9.5 10*3/uL (ref 3.4–10.8)

## 2020-01-04 LAB — MAGNESIUM: MAGNESIUM: 1.8 mg/dL (ref 1.6–2.3)

## 2020-01-04 LAB — PHOSPHORUS: PHOSPHORUS, SERUM: 3 mg/dL (ref 2.8–4.1)

## 2020-01-04 LAB — BILIRUBIN, DIRECT: BILIRUBIN DIRECT: 0.12 mg/dL (ref 0.00–0.40)

## 2020-01-04 LAB — GAMMA GT: GAMMA GLUTAMYL TRANSFERASE: 47 IU/L (ref 0–65)

## 2020-01-08 DIAGNOSIS — Z5181 Encounter for therapeutic drug level monitoring: Principal | ICD-10-CM

## 2020-01-08 DIAGNOSIS — Z944 Liver transplant status: Principal | ICD-10-CM

## 2020-01-08 DIAGNOSIS — E612 Magnesium deficiency: Principal | ICD-10-CM

## 2020-01-09 DIAGNOSIS — Z944 Liver transplant status: Principal | ICD-10-CM

## 2020-01-09 DIAGNOSIS — C220 Liver cell carcinoma: Principal | ICD-10-CM

## 2020-01-09 LAB — TACROLIMUS LEVEL: TACROLIMUS BLOOD: 2.2 ng/mL (ref 2.0–20.0)

## 2020-01-09 NOTE — Unmapped (Signed)
Patient's tac level with 11/10 results subpar. Spoke to patient who did not remember missing any doses of IS, but said he would repeat labs again in 2 weeks. Ordered his 18mos MRI and made scheduling request for TPA.

## 2020-01-10 DIAGNOSIS — E119 Type 2 diabetes mellitus without complications: Secondary | ICD-10-CM | POA: Diagnosis not present

## 2020-01-10 LAB — HM DIABETES EYE EXAM

## 2020-01-10 MED ORDER — ALENDRONATE 70 MG TABLET
ORAL_TABLET | ORAL | 11 refills | 28 days
Start: 2020-01-10 — End: 2021-01-09

## 2020-01-10 NOTE — Unmapped (Signed)
[facility] Refill Coordination Note    Specialty Medication(s) to be Shipped:   Transplant: Envarsus XR 0.75mg  and Envarsus XR 1mg  and General Specialty: mycophenolate 180mg     Other medication(s) to be shipped: Alendronate 70mg  ( refill requested), Amlodopine 10mg , Metformin 500mg , Sertaline 50mg      Anthony Alvarado, DOB: 1960-05-31  Phone: [phone] (home)       All above HIPAA information was verified with patient.     Was a Nurse, learning disability used for this call? No    Completed refill call assessment today to schedule patient's medication shipment from the [facility] [phone]).       Specialty medication(s) and dose(s) confirmed: Regimen is correct and unchanged.   Changes to medications: [name] reports no changes at this time.  Changes to insurance: No  Questions for the pharmacist: No    Confirmed patient received Welcome Packet with first shipment. The patient will receive a drug information handout for each medication shipped and additional FDA Medication Guides as required.       DISEASE/MEDICATION-SPECIFIC INFORMATION        N/A    SPECIALTY MEDICATION ADHERENCE     Medication Adherence    Patient reported X missed doses in the last month: 0  Specialty Medication: Envarsus XR 0.75mg   Patient is on additional specialty medications: Yes  Additional Specialty Medications: Envarsus XR 1mg   Patient Reported Additional Medication X Missed Doses in the Last Month: 0  Patient is on more than two specialty medications: Yes  Specialty Medication: Mycophenolate 180mg   Patient Reported Additional Medication X Missed Doses in the Last Month: 0                Envarsus XR 0.75 mg: 14 days of medicine on hand   Envarsus XR 1 mg: 14 days of medicine on hand   Mycophenolate 180 mg: 14 days of medicine on hand          SHIPPING     Shipping address confirmed in Epic.     Delivery Scheduled: Yes, Expected medication delivery date: 01/12/20.     Medication will be delivered via UPS to the prescription address in Epic [facility].    Nancy Nordmann [facility] Specialty Technician

## 2020-01-11 MED ORDER — ALENDRONATE 70 MG TABLET
ORAL_TABLET | ORAL | 11 refills | 28 days | Status: CP
Start: 2020-01-11 — End: 2021-01-10
  Filled 2020-01-11: qty 4, 28d supply, fill #0

## 2020-01-11 MED FILL — MYCOPHENOLATE SODIUM 180 MG TABLET,DELAYED RELEASE: 30 days supply | Qty: 60 | Fill #4 | Status: AC

## 2020-01-11 MED FILL — ALENDRONATE 70 MG TABLET: 28 days supply | Qty: 4 | Fill #0 | Status: AC

## 2020-01-11 MED FILL — AMLODIPINE 10 MG TABLET: ORAL | 30 days supply | Qty: 30 | Fill #6

## 2020-01-11 MED FILL — ENVARSUS XR 1 MG TABLET,EXTENDED RELEASE: 30 days supply | Qty: 30 | Fill #5 | Status: AC

## 2020-01-11 MED FILL — ENVARSUS XR 0.75 MG TABLET,EXTENDED RELEASE: ORAL | 30 days supply | Qty: 30 | Fill #5

## 2020-01-11 MED FILL — SERTRALINE 50 MG TABLET: 30 days supply | Qty: 30 | Fill #3 | Status: AC

## 2020-01-11 MED FILL — METFORMIN ER 500 MG TABLET,EXTENDED RELEASE 24 HR: 30 days supply | Qty: 120 | Fill #3 | Status: AC

## 2020-01-11 MED FILL — AMLODIPINE 10 MG TABLET: 30 days supply | Qty: 30 | Fill #6 | Status: AC

## 2020-01-11 MED FILL — METFORMIN ER 500 MG TABLET,EXTENDED RELEASE 24 HR: ORAL | 30 days supply | Qty: 120 | Fill #3

## 2020-01-11 MED FILL — SERTRALINE 50 MG TABLET: ORAL | 30 days supply | Qty: 30 | Fill #3

## 2020-01-11 MED FILL — ENVARSUS XR 0.75 MG TABLET,EXTENDED RELEASE: 30 days supply | Qty: 30 | Fill #5 | Status: AC

## 2020-01-11 MED FILL — MYCOPHENOLATE SODIUM 180 MG TABLET,DELAYED RELEASE: ORAL | 30 days supply | Qty: 60 | Fill #4

## 2020-01-11 MED FILL — ENVARSUS XR 1 MG TABLET,EXTENDED RELEASE: ORAL | 30 days supply | Qty: 30 | Fill #5

## 2020-01-15 DIAGNOSIS — E612 Magnesium deficiency: Principal | ICD-10-CM

## 2020-01-15 DIAGNOSIS — Z944 Liver transplant status: Principal | ICD-10-CM

## 2020-01-15 DIAGNOSIS — Z5181 Encounter for therapeutic drug level monitoring: Principal | ICD-10-CM

## 2020-01-25 DIAGNOSIS — Z944 Liver transplant status: Secondary | ICD-10-CM | POA: Diagnosis not present

## 2020-01-25 DIAGNOSIS — Z5181 Encounter for therapeutic drug level monitoring: Secondary | ICD-10-CM | POA: Diagnosis not present

## 2020-01-25 DIAGNOSIS — E612 Magnesium deficiency: Secondary | ICD-10-CM | POA: Diagnosis not present

## 2020-01-26 LAB — COMPREHENSIVE METABOLIC PANEL
A/G RATIO: 1.8 (ref 1.2–2.2)
ALBUMIN: 4.8 g/dL (ref 3.8–4.9)
ALKALINE PHOSPHATASE: 80 IU/L (ref 44–121)
ALT (SGPT): 57 IU/L — ABNORMAL HIGH (ref 0–44)
AST (SGOT): 35 IU/L (ref 0–40)
BILIRUBIN TOTAL: 0.3 mg/dL (ref 0.0–1.2)
BLOOD UREA NITROGEN: 19 mg/dL (ref 6–24)
BUN / CREAT RATIO: 18 (ref 9–20)
CALCIUM: 9.4 mg/dL (ref 8.7–10.2)
CHLORIDE: 97 mmol/L (ref 96–106)
CO2: 22 mmol/L (ref 20–29)
CREATININE: 1.04 mg/dL (ref 0.76–1.27)
GFR MDRD AF AMER: 92 mL/min/{1.73_m2}
GFR MDRD NON AF AMER: 79 mL/min/{1.73_m2}
GLOBULIN, TOTAL: 2.6 g/dL (ref 1.5–4.5)
GLUCOSE: 132 mg/dL — ABNORMAL HIGH (ref 65–99)
POTASSIUM: 4.6 mmol/L (ref 3.5–5.2)
SODIUM: 137 mmol/L (ref 134–144)
TOTAL PROTEIN: 7.4 g/dL (ref 6.0–8.5)

## 2020-01-26 LAB — CBC W/ DIFFERENTIAL
BANDED NEUTROPHILS ABSOLUTE COUNT: 0.1 10*3/uL (ref 0.0–0.1)
BASOPHILS ABSOLUTE COUNT: 0.1 10*3/uL (ref 0.0–0.2)
BASOPHILS RELATIVE PERCENT: 1 %
EOSINOPHILS ABSOLUTE COUNT: 0.4 10*3/uL (ref 0.0–0.4)
EOSINOPHILS RELATIVE PERCENT: 4 %
HEMATOCRIT: 42.2 % (ref 37.5–51.0)
HEMOGLOBIN: 14.6 g/dL (ref 13.0–17.7)
IMMATURE GRANULOCYTES: 1 %
LYMPHOCYTES ABSOLUTE COUNT: 2.7 10*3/uL (ref 0.7–3.1)
LYMPHOCYTES RELATIVE PERCENT: 26 %
MEAN CORPUSCULAR HEMOGLOBIN CONC: 34.6 g/dL (ref 31.5–35.7)
MEAN CORPUSCULAR HEMOGLOBIN: 30.2 pg (ref 26.6–33.0)
MEAN CORPUSCULAR VOLUME: 87 fL (ref 79–97)
MONOCYTES ABSOLUTE COUNT: 0.9 10*3/uL (ref 0.1–0.9)
MONOCYTES RELATIVE PERCENT: 8 %
NEUTROPHILS ABSOLUTE COUNT: 6.4 10*3/uL (ref 1.4–7.0)
NEUTROPHILS RELATIVE PERCENT: 60 %
PLATELET COUNT: 233 10*3/uL (ref 150–450)
RED BLOOD CELL COUNT: 4.84 x10E6/uL (ref 4.14–5.80)
RED CELL DISTRIBUTION WIDTH: 13.1 % (ref 11.6–15.4)
WHITE BLOOD CELL COUNT: 10.5 10*3/uL (ref 3.4–10.8)

## 2020-01-26 LAB — GAMMA GT: GAMMA GLUTAMYL TRANSFERASE: 59 IU/L (ref 0–65)

## 2020-01-26 LAB — PHOSPHORUS: PHOSPHORUS, SERUM: 2.6 mg/dL — ABNORMAL LOW (ref 2.8–4.1)

## 2020-01-26 LAB — BILIRUBIN, DIRECT: BILIRUBIN DIRECT: 0.11 mg/dL (ref 0.00–0.40)

## 2020-01-26 LAB — MAGNESIUM: MAGNESIUM: 1.7 mg/dL (ref 1.6–2.3)

## 2020-01-29 LAB — TACROLIMUS LEVEL: TACROLIMUS BLOOD: 7 ng/mL (ref 2.0–20.0)

## 2020-01-30 NOTE — Unmapped (Signed)
Scheduled pt for 18 mo post tx abd mri w sedation at [facility] on 03/18/20 as requested. Called pt, gave him details of appt, and he verbalized understanding.

## 2020-02-05 NOTE — Unmapped (Signed)
[facility] Refill Coordination Note  Medication: Envarsus, Myfortic    Unable to reach patient to schedule shipment for medication being filled at [facility]. Left voicemail on phone.  As this is the 3rd unsuccessful attempt to reach the patient, no additional phone call attempts will be made at this time.      Phone numbers attempted: [phone]  Last scheduled delivery: 01/11/20      Please call the [facility] at [phone] (option 4) should you have any further questions.      Thanks,  [facility] Specialty Team

## 2020-02-07 DIAGNOSIS — R7989 Other specified abnormal findings of blood chemistry: Principal | ICD-10-CM

## 2020-02-07 MED ORDER — TESTOSTERONE 1 % (25 MG/2.5 GRAM) TRANSDERMAL GEL PACKET
PACK | 0 refills | 0 days | Status: CP
Start: 2020-02-07 — End: ?

## 2020-02-07 NOTE — Unmapped (Signed)
[facility] Refill Coordination Note    Specialty Medication(s) to be Shipped:   Transplant: Envarsus XR 0.75mg mg and Envarsus XR 1mg  and General Specialty: mycophenolate 180mg     Other medication(s) to be shipped: Alendronate 70mg , Amlodine 10mg , Metformin 500mg , Sertaline 50mg , Pravastatin 40mg      Anthony Alvarado, DOB: 1960/11/03  Phone: [phone] (home)       All above HIPAA information was verified with patient.     Was a Nurse, learning disability used for this call? No    Completed refill call assessment today to schedule patient's medication shipment from the [facility] [phone]).       Specialty medication(s) and dose(s) confirmed: Regimen is correct and unchanged.   Changes to medications: [name] reports no changes at this time.  Changes to insurance: No  Questions for the pharmacist: No    Confirmed patient received Welcome Packet with first shipment. The patient will receive a drug information handout for each medication shipped and additional FDA Medication Guides as required.       DISEASE/MEDICATION-SPECIFIC INFORMATION        N/A    SPECIALTY MEDICATION ADHERENCE     Medication Adherence    Patient reported X missed doses in the last month: 0  Specialty Medication: Envarsus XR 0.75mg   Patient is on additional specialty medications: Yes  Additional Specialty Medications: Envarsus XR 1mg   Patient Reported Additional Medication X Missed Doses in the Last Month: 0  Patient is on more than two specialty medications: Yes  Specialty Medication: Mycophenolate 180mg   Patient Reported Additional Medication X Missed Doses in the Last Month: 0                Envarsus XR  0.75 mg: 7 days of medicine on hand   Envarsus XR 1 mg: 7 days of medicine on hand   Mycophenolate 180 mg: 7 days of medicine on hand         SHIPPING     Shipping address confirmed in Epic.     Delivery Scheduled: Yes, Expected medication delivery date: 02/12/20.     Medication will be delivered via UPS to the prescription address in Epic [facility].    Nancy Nordmann [facility] Specialty Technician

## 2020-02-09 MED FILL — ENVARSUS XR 1 MG TABLET,EXTENDED RELEASE: 30 days supply | Qty: 30 | Fill #6 | Status: AC

## 2020-02-09 MED FILL — ALENDRONATE 70 MG TABLET: 28 days supply | Qty: 4 | Fill #1 | Status: AC

## 2020-02-09 MED FILL — ALENDRONATE 70 MG TABLET: ORAL | 28 days supply | Qty: 4 | Fill #1

## 2020-02-09 MED FILL — ENVARSUS XR 1 MG TABLET,EXTENDED RELEASE: ORAL | 30 days supply | Qty: 30 | Fill #6

## 2020-02-09 MED FILL — SERTRALINE 50 MG TABLET: 30 days supply | Qty: 30 | Fill #4 | Status: AC

## 2020-02-09 MED FILL — PRAVASTATIN 40 MG TABLET: ORAL | 90 days supply | Qty: 90 | Fill #3

## 2020-02-09 MED FILL — METFORMIN ER 500 MG TABLET,EXTENDED RELEASE 24 HR: 30 days supply | Qty: 120 | Fill #4 | Status: AC

## 2020-02-09 MED FILL — AMLODIPINE 10 MG TABLET: ORAL | 30 days supply | Qty: 30 | Fill #7

## 2020-02-09 MED FILL — SERTRALINE 50 MG TABLET: ORAL | 30 days supply | Qty: 30 | Fill #4

## 2020-02-09 MED FILL — ENVARSUS XR 0.75 MG TABLET,EXTENDED RELEASE: ORAL | 30 days supply | Qty: 30 | Fill #6

## 2020-02-09 MED FILL — AMLODIPINE 10 MG TABLET: 30 days supply | Qty: 30 | Fill #7 | Status: AC

## 2020-02-09 MED FILL — PRAVASTATIN 40 MG TABLET: 90 days supply | Qty: 90 | Fill #3 | Status: AC

## 2020-02-09 MED FILL — ENVARSUS XR 0.75 MG TABLET,EXTENDED RELEASE: 30 days supply | Qty: 30 | Fill #6 | Status: AC

## 2020-02-09 MED FILL — MYCOPHENOLATE SODIUM 180 MG TABLET,DELAYED RELEASE: 30 days supply | Qty: 60 | Fill #5 | Status: AC

## 2020-02-09 MED FILL — METFORMIN ER 500 MG TABLET,EXTENDED RELEASE 24 HR: ORAL | 30 days supply | Qty: 120 | Fill #4

## 2020-02-09 MED FILL — MYCOPHENOLATE SODIUM 180 MG TABLET,DELAYED RELEASE: ORAL | 30 days supply | Qty: 60 | Fill #5

## 2020-02-14 DIAGNOSIS — E612 Magnesium deficiency: Principal | ICD-10-CM

## 2020-02-14 DIAGNOSIS — C220 Liver cell carcinoma: Principal | ICD-10-CM

## 2020-02-14 DIAGNOSIS — Z944 Liver transplant status: Principal | ICD-10-CM

## 2020-02-14 DIAGNOSIS — Z5181 Encounter for therapeutic drug level monitoring: Principal | ICD-10-CM

## 2020-02-14 NOTE — Unmapped (Addendum)
NP requested another testosterone level be run on patient, since therapy started a year ago and refill requested by his pharmacy. Placed order with LC. Will forward results to his pcp for future management. Contacted patient and requested he remind LC of testosterone order with liver txp labs, then next time he goes. He verbalized understanding.

## 2020-02-19 DIAGNOSIS — Z944 Liver transplant status: Principal | ICD-10-CM

## 2020-02-19 DIAGNOSIS — Z5181 Encounter for therapeutic drug level monitoring: Principal | ICD-10-CM

## 2020-02-19 DIAGNOSIS — E612 Magnesium deficiency: Principal | ICD-10-CM

## 2020-02-21 DIAGNOSIS — G4733 Obstructive sleep apnea (adult) (pediatric): Secondary | ICD-10-CM | POA: Diagnosis not present

## 2020-02-22 DIAGNOSIS — R7989 Other specified abnormal findings of blood chemistry: Secondary | ICD-10-CM | POA: Diagnosis not present

## 2020-02-22 DIAGNOSIS — E612 Magnesium deficiency: Secondary | ICD-10-CM | POA: Diagnosis not present

## 2020-02-22 DIAGNOSIS — Z944 Liver transplant status: Secondary | ICD-10-CM | POA: Diagnosis not present

## 2020-02-22 DIAGNOSIS — C220 Liver cell carcinoma: Secondary | ICD-10-CM | POA: Diagnosis not present

## 2020-02-22 DIAGNOSIS — Z5181 Encounter for therapeutic drug level monitoring: Secondary | ICD-10-CM | POA: Diagnosis not present

## 2020-02-23 LAB — CBC W/ DIFFERENTIAL
BANDED NEUTROPHILS ABSOLUTE COUNT: 0.1 10*3/uL (ref 0.0–0.1)
BASOPHILS ABSOLUTE COUNT: 0.1 10*3/uL (ref 0.0–0.2)
BASOPHILS RELATIVE PERCENT: 1 %
EOSINOPHILS ABSOLUTE COUNT: 0.4 10*3/uL (ref 0.0–0.4)
EOSINOPHILS RELATIVE PERCENT: 4 %
HEMATOCRIT: 41.1 % (ref 37.5–51.0)
HEMOGLOBIN: 14.2 g/dL (ref 13.0–17.7)
IMMATURE GRANULOCYTES: 1 %
LYMPHOCYTES ABSOLUTE COUNT: 2 10*3/uL (ref 0.7–3.1)
LYMPHOCYTES RELATIVE PERCENT: 23 %
MEAN CORPUSCULAR HEMOGLOBIN CONC: 34.5 g/dL (ref 31.5–35.7)
MEAN CORPUSCULAR HEMOGLOBIN: 29.5 pg (ref 26.6–33.0)
MEAN CORPUSCULAR VOLUME: 85 fL (ref 79–97)
MONOCYTES ABSOLUTE COUNT: 0.8 10*3/uL (ref 0.1–0.9)
MONOCYTES RELATIVE PERCENT: 9 %
NEUTROPHILS ABSOLUTE COUNT: 5.6 10*3/uL (ref 1.4–7.0)
NEUTROPHILS RELATIVE PERCENT: 62 %
PLATELET COUNT: 225 10*3/uL (ref 150–450)
RED BLOOD CELL COUNT: 4.82 x10E6/uL (ref 4.14–5.80)
RED CELL DISTRIBUTION WIDTH: 12.8 % (ref 11.6–15.4)
WHITE BLOOD CELL COUNT: 9 10*3/uL (ref 3.4–10.8)

## 2020-02-23 LAB — COMPREHENSIVE METABOLIC PANEL
A/G RATIO: 2.1 (ref 1.2–2.2)
ALBUMIN: 4.7 g/dL (ref 3.8–4.9)
ALKALINE PHOSPHATASE: 74 IU/L (ref 44–121)
ALT (SGPT): 64 IU/L — ABNORMAL HIGH (ref 0–44)
AST (SGOT): 45 IU/L — ABNORMAL HIGH (ref 0–40)
BILIRUBIN TOTAL: 0.4 mg/dL (ref 0.0–1.2)
BLOOD UREA NITROGEN: 14 mg/dL (ref 6–24)
BUN / CREAT RATIO: 15 (ref 9–20)
CALCIUM: 9 mg/dL (ref 8.7–10.2)
CHLORIDE: 102 mmol/L (ref 96–106)
CO2: 20 mmol/L (ref 20–29)
CREATININE: 0.94 mg/dL (ref 0.76–1.27)
GLOBULIN, TOTAL: 2.2 g/dL (ref 1.5–4.5)
GLUCOSE: 125 mg/dL — ABNORMAL HIGH (ref 65–99)
POTASSIUM: 4.7 mmol/L (ref 3.5–5.2)
SODIUM: 137 mmol/L (ref 134–144)
TOTAL PROTEIN: 6.9 g/dL (ref 6.0–8.5)

## 2020-02-23 LAB — PHOSPHORUS: PHOSPHORUS, SERUM: 2.3 mg/dL — ABNORMAL LOW (ref 2.8–4.1)

## 2020-02-23 LAB — MAGNESIUM: MAGNESIUM: 1.8 mg/dL (ref 1.6–2.3)

## 2020-02-23 LAB — AFP TUMOR MARKER: AFP-TUMOR MARKER: <0.9 ng/mL (ref 0.0–8.3)

## 2020-02-23 LAB — BILIRUBIN, DIRECT: BILIRUBIN DIRECT: 0.15 mg/dL (ref 0.00–0.40)

## 2020-02-23 LAB — GAMMA GT: GAMMA GLUTAMYL TRANSFERASE: 55 IU/L (ref 0–65)

## 2020-02-26 DIAGNOSIS — E612 Magnesium deficiency: Principal | ICD-10-CM

## 2020-02-26 DIAGNOSIS — Z5181 Encounter for therapeutic drug level monitoring: Principal | ICD-10-CM

## 2020-02-26 DIAGNOSIS — Z944 Liver transplant status: Principal | ICD-10-CM

## 2020-02-27 LAB — TACROLIMUS LEVEL: TACROLIMUS BLOOD: 5.1 ng/mL (ref 2.0–20.0)

## 2020-02-28 NOTE — Unmapped (Signed)
No testosterone result received with 12/30 results despite placing order. Contacted [facility] and added on total testosterone order. They agreed to ck sample and send authorization request if able to complete.

## 2020-02-29 LAB — TESTOSTERONE: TESTOSTERONE TOTAL: 288 ng/dL (ref 264–916)

## 2020-02-29 NOTE — Unmapped (Signed)
Received refill request for patient's testosterone replacement therapy. Per NP Martin-Velez, retested his testosterone level which was WNL. Sent results and replacement recommendation to patient's pcp for future management.

## 2020-03-04 DIAGNOSIS — Z944 Liver transplant status: Principal | ICD-10-CM

## 2020-03-04 DIAGNOSIS — E612 Magnesium deficiency: Principal | ICD-10-CM

## 2020-03-04 DIAGNOSIS — Z5181 Encounter for therapeutic drug level monitoring: Principal | ICD-10-CM

## 2020-03-05 ENCOUNTER — Other Ambulatory Visit: Payer: Self-pay | Admitting: Family Medicine

## 2020-03-11 DIAGNOSIS — E612 Magnesium deficiency: Principal | ICD-10-CM

## 2020-03-11 DIAGNOSIS — Z944 Liver transplant status: Principal | ICD-10-CM

## 2020-03-11 DIAGNOSIS — Z5181 Encounter for therapeutic drug level monitoring: Principal | ICD-10-CM

## 2020-03-14 LAB — SPECIMEN STATUS REPORT

## 2020-03-15 NOTE — Unmapped (Signed)
[facility] Refill Coordination Note    Specialty Medication(s) to be Shipped:   Transplant: Envarsus 1mg , Envarsus 0.75mg  and  mycophenolic acid 180mg     Other medication(s) to be shipped: alendronate, amlodipine, metformin, sertraline     Anthony Alvarado, DOB: [birth year]  Phone: [phone] (home)       All above HIPAA information was verified with patient.     Was a Nurse, learning disability used for this call? No    Completed refill call assessment today to schedule patient's medication shipment from the [facility] [phone]).       Specialty medication(s) and dose(s) confirmed: Regimen is correct and unchanged.   Changes to medications: Anthony Alvarado reports no changes at this time.  Changes to insurance: No  Questions for the pharmacist: No    Confirmed patient received Welcome Packet with first shipment. The patient will receive a drug information handout for each medication shipped and additional FDA Medication Guides as required.       DISEASE/MEDICATION-SPECIFIC INFORMATION        N/A    SPECIALTY MEDICATION ADHERENCE     Medication Adherence    Patient reported X missed doses in the last month: 0  Specialty Medication: Envarsus Xr 1mg   Patient is on additional specialty medications: Yes  Additional Specialty Medications: Envarsus Xr 0.75mg   Patient Reported Additional Medication X Missed Doses in the Last Month: 0  Patient is on more than two specialty medications: Yes  Specialty Medication: Mycophenolate 180mg   Patient Reported Additional Medication X Missed Doses in the Last Month: 0                Envarsus Xr 0.75 mg: 7 days of medicine on hand   Envarsus Xr 1 mg: 7 days of medicine on hand   Mycophenolate 180 mg: 7 days of medicine on hand        SHIPPING     Shipping address confirmed in Epic.     Delivery Scheduled: Yes, Expected medication delivery date: 03/19/20.     Medication will be delivered via UPS to the prescription address in Epic W[facility].    Anthony Alvarado   [facility] Specialty Pharmacist

## 2020-03-18 DIAGNOSIS — Z5181 Encounter for therapeutic drug level monitoring: Principal | ICD-10-CM

## 2020-03-18 DIAGNOSIS — E612 Magnesium deficiency: Principal | ICD-10-CM

## 2020-03-18 DIAGNOSIS — Z944 Liver transplant status: Principal | ICD-10-CM

## 2020-03-18 MED FILL — ENVARSUS XR 1 MG TABLET,EXTENDED RELEASE: ORAL | 30 days supply | Qty: 30 | Fill #7

## 2020-03-18 MED FILL — ENVARSUS XR 0.75 MG TABLET,EXTENDED RELEASE: ORAL | 30 days supply | Qty: 30 | Fill #7

## 2020-03-18 MED FILL — MYCOPHENOLATE SODIUM 180 MG TABLET,DELAYED RELEASE: ORAL | 30 days supply | Qty: 60 | Fill #6

## 2020-03-18 MED FILL — AMLODIPINE 10 MG TABLET: ORAL | 30 days supply | Qty: 30 | Fill #8

## 2020-03-18 MED FILL — METFORMIN ER 500 MG TABLET,EXTENDED RELEASE 24 HR: ORAL | 30 days supply | Qty: 120 | Fill #5

## 2020-03-18 MED FILL — SERTRALINE 50 MG TABLET: ORAL | 30 days supply | Qty: 30 | Fill #5

## 2020-03-18 MED FILL — ALENDRONATE 70 MG TABLET: ORAL | 28 days supply | Qty: 4 | Fill #2

## 2020-03-19 DIAGNOSIS — U071 COVID-19: Secondary | ICD-10-CM | POA: Diagnosis not present

## 2020-03-19 DIAGNOSIS — D849 Immunodeficiency, unspecified: Principal | ICD-10-CM

## 2020-03-19 NOTE — Unmapped (Signed)
Patient messaged [facility] via MyChart explaining that he started developing cold sx last Thur.evening but tested Covid neg.on Sat. He retested today and was covid+. Spoke with patient who said he has been having sx of sore throat, nasal/chest congestion with cough, and body aches. Negative for fever, sob or diarrhea. Reviewed with pharmd [facility], who recommended referral for covid therapeutics team. Referral placed. Notified patient of referral placement and requested return call to [facility] if he has not been contacted by tomorrow morning. He verbalized agreement.

## 2020-03-19 NOTE — Unmapped (Signed)
Telemedicine PHONE Visit    Viviano Simas FNP    I have identified myself to the patient and conveyed my credentials to pt. YES  In case we get disconnected, patient's phone number is available YES  Patient has signed informed consent on file in medical record. YES  Is there someone else in the room? NO  HPI:  Anthony Alvarado is 60 y.o. and presents today in the [facility] with +COVID-19 on 03/19/20 for evaluation for Paxlovid/ mAB infusion.  Symptoms began 03/14/2020 with cough, ST, body aches, malaise and fatigue.  He is vaccines in the past year.    PMH significant for liver transplant on tacrolimus; DM, HTN on amlodipine; HLD on Pravachol and other medical comorbidities.    The PCP for this patient is Joaquim Nam, MD.      ADULT PAXLOVID/MAB INFUSION CRITERIA    Patient meets one of more of the following:    Inclusion Criteria -Treatment (all must be met):  ??? Diagnosed with COVID-19  ??? 1 or more mild-to-moderate COVID-19 symptom (e.g., fever, cough, sore throat, muscle pain, etc)  AND  ??? Meets one of the criteria for risk of severe disease progression: Transplant patient on immunosuppression, obesity BMI 31          Exclusion Criteria: None      Medication reconciliation process was completed.     The patient is on one or more of the following medications. Amlodipne, Tacrolimus    The patient is not able to stop the medication during their full paxlovid treatment course.     The patient was counseled on the dangers of these medications if mixed with Paxlovid.     Contraindicated ??? Specific Medications  Listed alphabetically by generic name   Abemaciclib  Afluzosin  Amiodarone  Amlodipine  Apalutamide*  Apixaban  Atorvastatin  Bedaquiline  Bosentan*  Carbamazepine*  Ceritinib  Clarithromycin  Clozapine  Colchicine  Cyclosporine  Dasatinib*  Digoxin  Diltiazem  Dronedarone  Encorafenib* Fentanyl   Felodipine  Flecainide  Ibrutinib*  Ivosidenib*  Ketoconazole (systemic)  Lovastatin  Lurasidone  Methadone  Midazolam (oral)  Neratinib*  NIcardipine   Nifedipine   Nilotinib*  Pethidine  Phenobarbital*  Phenytoin*  Pimozide  Piroxicam  Propafenone Propoxyphene  Quetiapine  Quinidine  Ranolazine  Rifabutin  Rifampin*  Rivaroxaban  Rosuvastatin  Sildenafil (Revatio??) ??? only when prescribed at doses for Pulmonary Arterial Hypertension  Simvastatin  Sirolimus  St. John's Wort (hypericum perforatum)*  Tacrolimus  Triazolam  Venetoclax*  Vinblastine*  Vincristine*  Voriconazole  Warfarin   Contraindicated ??? Medication Classes   Ergot Derivatives     Patient is not on steroids. Is taking Tacrolimis    Patient does NOT qualify for Paxlovid based on my medication review.  .    If patient does not meed criteria for Paxlovid, referral message was sent to the [facility] for Remdesivir or Sotrovimab infusion request.        PLAN:     1. COVID-19 Infection and meest high risk criteria as above: (BMI) > or = to 25, Diabetes, Asthma, HTN, OSA, s/p Liver Transplant.  On  day 6 from the onset of symptoms    ?? Referral for mAB evaluation to COVID therapeutic team.        The patient reports they are currently: at home. I spent 18 minutes on the phone with the patient on the date of service. I spent an additional 10 minutes on pre- and  post-visit activities on the date of service.     The patient was physically located in [place] or a state in which I am permitted to provide care. The patient and/or parent/guardian understood that s/he may incur co-pays and cost sharing, and agreed to the telemedicine visit. The visit was reasonable and appropriate under the circumstances given the patient's presentation at the time.    The patient and/or parent/guardian has been advised of the potential risks and limitations of this mode of treatment (including, but not limited to, the absence of in-person examination) and has agreed to be treated using telemedicine. The patient's/patient's family's questions regarding telemedicine have been answered.     If the visit was completed in an ambulatory setting, the patient and/or parent/guardian has also been advised to contact their provider???s office for worsening conditions, and seek emergency medical treatment and/or call 911 if the patient deems either necessary.

## 2020-03-19 NOTE — Unmapped (Signed)
COVID Infusion Therapy Questionnaire    Have you received a positive COVID test result (excluding antibody testing) AND are currently in an outpatient setting? Yes  Do you have at least one mild or moderate COVID symptom that began no more than 5 days ago? Yes  What date did you test COVID+ 03/19/2020  What date did your symptoms start 03/14/2020  Which of the following COVID symptoms are you experiencing: Cough and Sore throat  Do you (1) have new oxygen requirements or (2) increased oxygen requirement due to COVID-19? No   Patient is under 51 years of age? No      Vaccination status:  ??? Patient has received a COVID-19 vaccine: Yes  ??? Manufacturer: Pfizer  ??? # of doses received: 2 doses  ??? What date did you receive your last dose (mm/dd/yyyy): 08/2019      Screening for COVID Therapeutics (18 and up)  ??? Over the age of 39: No  ??? Pregnant: No  ??? Recent delivery within 6 weeks: No  ??? BMI 35 or above: No  o Patient BMI: 31.41  ??? Immunosuppressive disease (e.g., cancer, not in remission; solid organ transplant; HIV; CD4 <200 cells/m3): Yes  ??? Currently or in the past 3 months receiving immunosuppressive treatment (e.g., chemotherapy; rituximab; steroid use at least 20 mg/day or at least 2 mg/kg/day prednisone or equivalent for at least 14 days): Yes  ??? Diabetes if BMI > 30: Yes  ??? Cardiovascular disease if BMI >30: yesHypertension if BMI >30: Yes  ??? Chronic lung disease (e.g., COPD, cystic fibrosis, pulmonary arterial hypertension - requiring daily inhaled steroids plus a second controller medicine or history of ICU admission for asthma): No  ??? Neurodevelopmental disorders affecting respiratory function (e.g., cerebral palsy, etc): No  ??? Sickle cell disease: No   ??? Congenital heart disease causing significant hemodynamic compromise: No  ??? Medical related technological dependence (e.g., tracheostomy, gastrostomy, or positive pressure ventilation [not related to COVID-19]): No      Resolution  Patient qualifies - Because you meet the prioritized eligibility criteria at [facility] at this time, we can offer treatment.  These treatments may help to prevent progression of mild-to-moderate symptoms to severe symptoms and/or hospitalization in those who are confirmed high-risk COVID (+) and are within 5 days of symptom onset. The U.S. FDA has issued an Emergency Use Authorization to permit the emergency use of these unapproved treatments. At this time there are no approved and available alternative treatments for out-patients. The options offered are based on drug and appointment availability. Paxlovid is an oral antiviral medication and is currently the best treatment. Due to many drug interactions we require a provider level evaluation to determine if you can receive this drug. That provider will send the prescription to a [facility] pharmacy if this treatment will work for you. Marella Bile is a one-time IV infusion of monoclonal antibodies. Remdesivir is an antiviral medication that is also an infusion. It was formerly given only to hospitalized patients. This medication is now available for out-patients, given by IV, 3 doses over 3 consecutive days. While this medication was approved by the FDA for treatment of hospitalized patients, outpatient treatment is considered off label. You will have standard copays, deductibles or coinsurance out-of-pocket cost sharing for these services as directed by your medical insurance plan. (Use available documentation to answer any questions.)  Patient Agreed to therapy. Scheduling notified.  Patient County: [place]

## 2020-03-20 ENCOUNTER — Encounter: Admit: 2020-03-20 | Discharge: 2020-03-21 | Payer: MEDICARE

## 2020-03-20 DIAGNOSIS — U071 COVID-19: Principal | ICD-10-CM

## 2020-03-20 MED ADMIN — sotrovimab 500 mg in sodium chloride (NS) 0.9 % 100 mL IVPB: 500 mg | INTRAVENOUS | @ 16:00:00 | Stop: 2020-03-20

## 2020-03-20 NOTE — Unmapped (Signed)
Error

## 2020-03-20 NOTE — Unmapped (Signed)
Post Infusion Contacts    After your monoclonal antibody therapy, you can contact your primary care provider or our Clinical Contact Center [phone] for minor symptoms.   For severe symptoms, please dial 911.     A [facility] nurse will be calling you to check in tomorrow as well.     Infusion-related reactions have been observed with administration of sotrovimab Signs and symptoms of infusion related reactions may include:   fever, chills, nausea, headache, bronchospasm, (tightness in chest or airways) hypotension (low blood pressure or felling faint), angioedema (swelling of mouth, tongue or throat or face), throat irritation, rash including urticaria (hives or red raised rash), pruritus (itching), myalgia (muscle pains or aches), dizziness.     CALL 911 for SHORTNESS OF BREATH OR CHEST PAIN    Post Infusion:  Anthony Alvarado has been informed of the following as noted below:  - After this monoclonal antibody infusion you will continue to self-isolate and use infection control measures according to CDC guidelines to include: wear mask, isolate, social distance, avoid sharing personal items, clean and disinfect high touch surfaces, and frequent handwashing.  - Follow up with your Primary Care Provider (PCP) to schedule to receive COVID-19 vaccine.  Please note that you will not be able to receive it until 90 days post infusion.  - If have you have any additional COVID-19 tests completed, you will remain positive for approximately 80-90 days after the onset of the initial COVID-19 symptoms.  - Please call and schedule a follow up appointment with primary care provider after 14 days from your COVID-19 diagnosis.  If develops worsening symptoms call them sooner for evaluation, or if serious symptoms, seek assistance at the nearest emergency department.

## 2020-03-20 NOTE — Unmapped (Signed)
Monoclonal Antibody Infusion Provider Visit    Reason for visit:  Anthony Alvarado, 60 y.o. male was referred by [facility] for Monoclonal Antibody infusion treatment for COVID-19 infection.    HPI:  COVID-19 symptoms were first noted on 1/20 and include cough, sore throat.  COVID-19 positive diagnosis confirmed with test on 03/19/2020 and have been verified.    COVID-19 vaccine: have received both doses of vaccination Pfizer 2nd dose 08/2019.    Assessment:  1. COVID-19 Infection and meet high risk criteria by the FDA administration guidelines with the following high risk criteria:Body mass index (BMI) > or = to 25, Diabetes, Asthma, HTN, OSA, s/p Liver Transplant.  Today is day 6 from the onset of symptoms.    Plan:  1. I have personally discussed the following regarding treatment with Monoclonal Antibody Infusion:   a. It has been received emergency use authorization (EUA) by the FDA for the treatment of mild to moderate COVID-19.  b. Benefits of treatment:  Monoclonal Antibodies have been used and tolerated by patients in the treatment of cancer, inflammatory and autoimmune processes for a long time.  With the EUA for the treatment of COVID-19, patients treated with monoclonal antibodies may help reduce disease progression, hospitalization and long term complications in patients meeting the high risk criteria when completed within 10 days of the onset of symptoms.  c.  Treatment Protocol:  Process will take ~ 2 - 2.5 hours.  An IV will be started; the infusion of the Monoclonal Antibody will be administered over 23 mins with evaluation of vital signs and direct observation for the duration of the infusion and one hour post infusion.  d.  Potential side effects to include:  Fever, chills, nausea, headache, bronchospasm, hypotension, angioedema, throat irritation, rash including urticaria, pruritis, myalgia, dizziness.  e.  Post Infusion:  Anthony Alvarado was informed that they will need to continue to self-isolate and use infection control measures according to CDC guidelines to include: wear mask, isolate, social distance, avoid sharing personal items, clean and disinfect high touch surfaces, and frequent handwashing.    2.   All questions have been answered and Anthony Alvarado has verbally consented for Monoclonal Antibody Infusion.  Patient will receive sotrovimab.  3.   Anthony Alvarado was provided with a copy of the Fact Sheet for Patients, Parents and Caregivers Emergency Use Authorization (EUA) of Sotrovimab for Coronavirus Disease 2019 (COVID-19).  4.   Instructed that they have completed the initial COVID-19 vaccine series.  They should check with their PCP to determine if they meet the criteria for a COVID-19 Booster vaccination, and if they are eligible for a booster, they will need to wait until 90 days post infusion to schedule.  5.   Instructed to call and schedule a follow up appointment with primary care provider after 14 days from your COVID-19 diagnosis.  If develops worsening symptoms call them sooner for evaluation, or if serious symptoms, seek assistance at the nearest emergency department.  6.   I personally spent 15 minutes face-to-face and non-face-to-face in the care of this patient, which includes review of medical history, medications, laboratory and other diagnostic results, evaluation, education, and entering treatment orders on the date of service.    Physical Exam:  VITAL SIGNS:   Vitals:    03/20/20 0912 03/20/20 1045 03/20/20 1125   BP:  151/94 147/95   Pulse:  73 69   Temp:  36.3 ??C (97.4 ??F) 36.6 ??C (97.9 ??F)  TempSrc:  Temporal Temporal   SpO2:  96% 99%   Weight: 93.4 kg (206 lb)       General:  Resting comfortably in NAD  Skin:  Warm & dry  Neuro:  Alert & oriented X 3.  Cooperative.  HEENT:  Normocephalic, atraumatic.   Resp:  Resp even and nonlabored.  Lungs sounds clear throughout  CV:  S1S2 RRR, no murmurs, gallops or rubs  GI:  Soft, nontender, nondistended, NABS  Ext:  No clubbing, cyanosis, or edema.  Moving all extremities.  Neck:  Supple, no thyromegaly.  No JVD  Pulses:  2+ and symmetrical upper and lower extremities  Heme: No signs of bleeding or excessive bruising.    Allergies   Allergen Reactions   ??? Watermelon Flavor      Mouth itching       Medications:   Prior to Admission medications    Medication Sig Start Date End Date Taking? Authorizing Provider   alendronate (FOSAMAX) 70 MG tablet Take 1 tablet (70 mg total) by mouth every seven (7) days. 01/11/20 01/10/21  Lorel Monaco Chargualaf, CPP   amLODIPine (NORVASC) 10 MG tablet Take 1 tablet (10 mg total) by mouth daily. 06/20/19 06/19/20  Lorel Monaco Chargualaf, CPP   aspirin (ECOTRIN) 81 MG tablet Take 1 tablet (81 mg total) by mouth daily. 09/18/19 09/17/20  Lorel Monaco Chargualaf, CPP   azelastine (ASTELIN) 137 mcg (0.1 %) nasal spray 1 spray by Each Nare route daily as needed.  09/22/16   Historical Provider, MD   blood sugar diagnostic (ONETOUCH ULTRA TEST) Strp USE TO CHECK SUGAR DAILY 04/26/19   Historical Provider, MD   blood sugar diagnostic Strp Use as directed Three (3) times a day before meals. 09/22/18   Drake Leach, PA   calcium carbonate (TUMS) 200 mg calcium (500 mg) chewable tablet Chew 1 tablet Three (3) times a day as needed for heartburn.     Historical Provider, MD   ENVARSUS XR 1 mg Tb24 extended release tablet Take 1 tablet (1 mg total) by mouth daily with 1 (0.75 mg) tablet for a total dose of 1.75 mg daily 07/20/19   Bertram Denver, FNP   fluticasone propionate (FLONASE) 50 mcg/actuation nasal spray PLACE ONE OR TWO SPRAYS INTO BOTH NOSTRILS DAILY AS NEEDED. 09/22/16   Historical Provider, MD   lancets 33 gauge Misc 1 each by Miscellaneous route Three (3) times a day before meals. 09/22/18   Drake Leach, PA   levothyroxine (SYNTHROID) 150 MCG tablet Take 1 tablet (150 mcg total) by mouth daily. 09/24/18 09/24/19  Chirag Lanney Gins, MD   metFORMIN (GLUCOPHAGE XR) 500 MG 24 hr tablet Take 2 tablets (1,000 mg total) by mouth two (2) times a day. 09/18/19 09/17/20  Jordan Likes, CPP   mycophenolate (MYFORTIC) 180 MG EC tablet Take 1 tablet (180 mg total) by mouth Two (2) times a day. 06/19/19 06/18/20  Bertram Denver, FNP   pravastatin (PRAVACHOL) 40 MG tablet Take 1 tablet (40 mg total) by mouth every evening. 04/17/19 05/09/20  Chirag Lanney Gins, MD   sertraline (ZOLOFT) 50 MG tablet Take 1 tablet (50 mg total) by mouth daily. 09/18/19 09/17/20  Lorel Monaco Chargualaf, CPP   ENVARSUS XR 0.75 mg Tb24 extended release tablet Take 1 tablet (0.75 mg total) by mouth daily with 1 (1 mg) tablet for a total dose of 1.75 mg daily. 07/20/19   Bertram Denver, FNP   testosterone (ANDROGEL) 1 % (  25 mg/2.5gram) GlPk APPLY 2.5G (1 PACKET) TO EACH SHOULDER/UPPER ARM DAILY FOR A TOTAL DAILY DOSE OF 5G 02/07/20   Bertram Denver, FNP     Past Medical History:   Diagnosis Date   ??? Aortic valve stenosis    ??? Autoimmune cholangitis    ??? 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 Cholestatic cirrhosis (CMS-HCC)    ??? Hepatic encephalopathy (CMS-HCC)    ??? Hypertension    ??? Sleep apnea    ??? Type 2 diabetes mellitus (CMS-HCC)      Past Surgical History:   Procedure Laterality Date   ??? APPENDECTOMY     ??? CHG US GUIDE, TISSUE ABLATION N/A 05/03/2018    Procedure: ULTRASOUND GUIDANCE FOR, AND MONITORING OF, PARENCHYMAL TISSUE ABLATION;  Surgeon: Particia Nearing, MD;  Location: MAIN OR [facility];  Service: Transplant   ??? PR CATH PLACE/CORON ANGIO, IMG SUPER/INTERP,R&L HRT CATH, L HRT VENTRIC N/A 03/21/2018    Procedure: Left/Right Heart Catheterization;  Surgeon: Neal Dy, MD;  Location: [facility] CATH;  Service: Cardiology   ??? PR DECORTICATION,PULMONARY,TOTAL Right 01/13/2019    Procedure: Decortic Pulm (Separt Proc); Tot;  Surgeon: Evert Kohl, MD;  Location: MAIN OR [facility];  Service: Thoracic   ??? PR EGD FLEXIBLE FOREIGN BODY REMOVAL N/A 12/26/2018    Procedure: UGI ENDOSCOPY; W/REMOVAL FOREIGN BODY;  Surgeon: Vonda Antigua, MD;  Location: [facility];  Service: Gastroenterology   ??? PR LAP,ABLAT 1+ LIVER TUMOR(S),RADIOFREQ N/A 05/03/2018    Procedure: LAPAROSCOPY, SURGICAL, ABLATION OF 1 OR MORE LIVER TUMOR(S); RADIOFREQUENCY;  Surgeon: Particia Nearing, MD;  Location: MAIN OR [facility]  Service: Transplant   ??? PR PERQ DRAINAGE PLEURA INSERT CATH W/IMAGING N/A 01/11/2019    Procedure: PLEURAL DRAINAGE, PERC, W INSERTION OF INDWELLING CATHETER; W IMAGING GUIDANCE;  Surgeon: Jerelyn Charles, MD;  Location: BRONCH PROCEDURE LAB [facility];  Service: Pulmonary   ??? PR THORACENTESIS NEEDLE/CATH PLEURA W/IMAGING N/A 01/06/2019    Procedure: THORACENTESIS W/ IMAGING;  Surgeon: Cleora Fleet, MD;  Location: BRONCH PROCEDURE LAB [facility];  Service: Pulmonary   ??? PR THORACOSCOPY SURG W/PLEURODESIS Right 01/13/2019    Procedure: THORACOSCOPY, SURGICAL; WITH PLEURODESIS (EG, MECHANICAL OR CHEMICAL);  Surgeon: Evert Kohl, MD;  Location: MAIN OR [facility];  Service: Thoracic   ??? PR TRANSPLANT LIVER,ALLOTRANSPLANT Bilateral 09/16/2018    Procedure: LIVER ALLOTRANSPLANTATION; ORTHOTOPIC, PARTIAL OR WHOLE, FROM CADAVER OR LIVING DONOR, ANY AGE;  Surgeon: Florene Glen, MD;  Location: MAIN OR [facility];  Service: Transplant   ??? PR TRANSPLANT,PREP DONOR LIVER, WHOLE N/A 09/16/2018    Procedure: [facility] STD PREP CAD DONOR WHOLE LIVER GFT PRIOR TNSPLNT,INC CHOLE,DISS/REM SURR TISSU WO TRISEG/LOBE SPLT;  Surgeon: Florene Glen, MD;  Location: MAIN OR [facility];  Service: Transplant   ??? SKIN BIOPSY       Social History:  Social History     Tobacco Use   Smoking Status Never Smoker   Smokeless Tobacco Never Used     Social History     Substance and Sexual Activity   Alcohol Use Not Currently     Social History     Substance and Sexual Activity   Drug Use Never     Family History   Problem Relation Age of Onset   ??? Asthma Mother    ??? COPD Father    ??? Cancer Father    ??? Autoimmune disease Sister  AIH   ??? Heart disease Sister    ??? Colon cancer Maternal Grandfather    ??? Melanoma Neg Hx    ??? Basal cell carcinoma Neg Hx    ??? Squamous cell carcinoma Neg Hx      Kerem Gilmer B Kaimana Neuzil, DNP, FNP-BC  03/20/2020, 11:39 AM

## 2020-03-25 ENCOUNTER — Encounter: Admit: 2020-03-25 | Discharge: 2020-03-26 | Payer: MEDICARE

## 2020-03-25 DIAGNOSIS — C220 Liver cell carcinoma: Principal | ICD-10-CM

## 2020-03-25 DIAGNOSIS — Z944 Liver transplant status: Principal | ICD-10-CM

## 2020-03-25 DIAGNOSIS — Z5181 Encounter for therapeutic drug level monitoring: Principal | ICD-10-CM

## 2020-03-25 DIAGNOSIS — E612 Magnesium deficiency: Principal | ICD-10-CM

## 2020-03-25 DIAGNOSIS — K760 Fatty (change of) liver, not elsewhere classified: Secondary | ICD-10-CM | POA: Diagnosis not present

## 2020-03-25 DIAGNOSIS — Z8505 Personal history of malignant neoplasm of liver: Secondary | ICD-10-CM | POA: Diagnosis not present

## 2020-03-25 MED ADMIN — gadobenate dimeglumine (MULTIHANCE) 529 mg/mL (0.1mmol/0.2mL) solution 9 mL: 9 mL | INTRAVENOUS | @ 15:00:00 | Stop: 2020-03-25

## 2020-03-25 MED ADMIN — diazePAM (VALIUM) injection 5 mg: 5 mg | INTRAVENOUS | @ 14:00:00 | Stop: 2020-03-25

## 2020-03-25 NOTE — Unmapped (Addendum)
Patient's 18mos MRI completed and reviewed today by NP Martin-Velez, without recommendation for f/u surveillance before next annual, as per previous HCC monitoring guidelines. Patient's AFP on 12/30 WNL.     Forwarded patient's MRI report and previous lipid panel results to pcp for ongoing management of his cholesterol and statin medication.

## 2020-04-01 DIAGNOSIS — E612 Magnesium deficiency: Principal | ICD-10-CM

## 2020-04-01 DIAGNOSIS — Z5181 Encounter for therapeutic drug level monitoring: Principal | ICD-10-CM

## 2020-04-01 DIAGNOSIS — Z944 Liver transplant status: Principal | ICD-10-CM

## 2020-04-02 DIAGNOSIS — Z5181 Encounter for therapeutic drug level monitoring: Secondary | ICD-10-CM | POA: Diagnosis not present

## 2020-04-02 DIAGNOSIS — E612 Magnesium deficiency: Secondary | ICD-10-CM | POA: Diagnosis not present

## 2020-04-02 DIAGNOSIS — Z944 Liver transplant status: Secondary | ICD-10-CM | POA: Diagnosis not present

## 2020-04-03 LAB — COMPREHENSIVE METABOLIC PANEL
A/G RATIO: 1.8 (ref 1.2–2.2)
ALBUMIN: 4.9 g/dL (ref 3.8–4.9)
ALKALINE PHOSPHATASE: 77 IU/L (ref 44–121)
ALT (SGPT): 63 IU/L — ABNORMAL HIGH (ref 0–44)
AST (SGOT): 38 IU/L (ref 0–40)
BILIRUBIN TOTAL: 0.5 mg/dL (ref 0.0–1.2)
BLOOD UREA NITROGEN: 21 mg/dL (ref 6–24)
BUN / CREAT RATIO: 19 (ref 9–20)
CALCIUM: 9.9 mg/dL (ref 8.7–10.2)
CHLORIDE: 98 mmol/L (ref 96–106)
CO2: 20 mmol/L (ref 20–29)
CREATININE: 1.1 mg/dL (ref 0.76–1.27)
GLOBULIN, TOTAL: 2.7 g/dL (ref 1.5–4.5)
GLUCOSE: 129 mg/dL — ABNORMAL HIGH (ref 65–99)
POTASSIUM: 4.7 mmol/L (ref 3.5–5.2)
SODIUM: 136 mmol/L (ref 134–144)
TOTAL PROTEIN: 7.6 g/dL (ref 6.0–8.5)

## 2020-04-03 LAB — MAGNESIUM: MAGNESIUM: 1.7 mg/dL (ref 1.6–2.3)

## 2020-04-03 LAB — CBC W/ DIFFERENTIAL
BANDED NEUTROPHILS ABSOLUTE COUNT: 0.1 10*3/uL (ref 0.0–0.1)
BASOPHILS ABSOLUTE COUNT: 0.1 10*3/uL (ref 0.0–0.2)
BASOPHILS RELATIVE PERCENT: 1 %
EOSINOPHILS ABSOLUTE COUNT: 0.4 10*3/uL (ref 0.0–0.4)
EOSINOPHILS RELATIVE PERCENT: 4 %
HEMATOCRIT: 42.7 % (ref 37.5–51.0)
HEMOGLOBIN: 14.6 g/dL (ref 13.0–17.7)
IMMATURE GRANULOCYTES: 1 %
LYMPHOCYTES ABSOLUTE COUNT: 2.2 10*3/uL (ref 0.7–3.1)
LYMPHOCYTES RELATIVE PERCENT: 23 %
MEAN CORPUSCULAR HEMOGLOBIN CONC: 34.2 g/dL (ref 31.5–35.7)
MEAN CORPUSCULAR HEMOGLOBIN: 30 pg (ref 26.6–33.0)
MEAN CORPUSCULAR VOLUME: 88 fL (ref 79–97)
MONOCYTES ABSOLUTE COUNT: 0.9 10*3/uL (ref 0.1–0.9)
MONOCYTES RELATIVE PERCENT: 9 %
NEUTROPHILS ABSOLUTE COUNT: 6.4 10*3/uL (ref 1.4–7.0)
NEUTROPHILS RELATIVE PERCENT: 62 %
PLATELET COUNT: 233 10*3/uL (ref 150–450)
RED BLOOD CELL COUNT: 4.87 x10E6/uL (ref 4.14–5.80)
RED CELL DISTRIBUTION WIDTH: 13.1 % (ref 11.6–15.4)
WHITE BLOOD CELL COUNT: 10 10*3/uL (ref 3.4–10.8)

## 2020-04-03 LAB — BILIRUBIN, DIRECT: BILIRUBIN DIRECT: 0.14 mg/dL (ref 0.00–0.40)

## 2020-04-03 LAB — PHOSPHORUS: PHOSPHORUS, SERUM: 3.5 mg/dL (ref 2.8–4.1)

## 2020-04-03 LAB — GAMMA GT: GAMMA GLUTAMYL TRANSFERASE: 71 IU/L — ABNORMAL HIGH (ref 0–65)

## 2020-04-06 LAB — TACROLIMUS LEVEL: TACROLIMUS BLOOD: 4.4 ng/mL (ref 2.0–20.0)

## 2020-04-08 DIAGNOSIS — Z944 Liver transplant status: Principal | ICD-10-CM

## 2020-04-08 DIAGNOSIS — E612 Magnesium deficiency: Principal | ICD-10-CM

## 2020-04-08 DIAGNOSIS — Z5181 Encounter for therapeutic drug level monitoring: Principal | ICD-10-CM

## 2020-04-09 NOTE — Unmapped (Signed)
error 

## 2020-04-10 NOTE — Unmapped (Signed)
[facility] Refill Coordination Note    Specialty Medication(s) to be Shipped:   Transplant: Envarsus XR 0.75mg  and Envarsus XR 1mg  and General Specialty: mycophenolate 180mg     Other medication(s) to be shipped: Alendronate 70mg , Amlodipine 10mg , Metformin 500mg , Sertraline 50mg      Anthony Alvarado, DOB: September 07, 1960  Phone: [phone] (home)       All above HIPAA information was verified with patient.     Was a Nurse, learning disability used for this call? No    Completed refill call assessment today to schedule patient's medication shipment from the [facility] [phone]).       Specialty medication(s) and dose(s) confirmed: Regimen is correct and unchanged.   Changes to medications: Anthony Alvarado reports no changes at this time.  Changes to insurance: No  Questions for the pharmacist: No    Confirmed patient received Welcome Packet with first shipment. The patient will receive a drug information handout for each medication shipped and additional FDA Medication Guides as required.       DISEASE/MEDICATION-SPECIFIC INFORMATION        N/A    SPECIALTY MEDICATION ADHERENCE     Medication Adherence    Patient reported X missed doses in the last month: 1  Specialty Medication: Envarsus 0.75mg   Patient is on additional specialty medications: Yes  Additional Specialty Medications: Envarsus 1mg   Patient Reported Additional Medication X Missed Doses in the Last Month: 1  Patient is on more than two specialty medications: Yes  Specialty Medication: Mycophenolate 180mg   Patient Reported Additional Medication X Missed Doses in the Last Month: 1                Envarsus XR 0.75 mg: 7-9 days of medicine on hand   Envarsus XR 1 mg: 7-9 days of medicine on hand   Mycophenolate 180 mg: 7-9 days of medicine on hand         SHIPPING     Shipping address confirmed in Epic.     Delivery Scheduled: Yes, Expected medication delivery date: 04/15/20.     Medication will be delivered via UPS to the prescription address in Epic [facility].    Nancy Nordmann [facility] Specialty Technician

## 2020-04-12 MED FILL — METFORMIN ER 500 MG TABLET,EXTENDED RELEASE 24 HR: ORAL | 30 days supply | Qty: 120 | Fill #6

## 2020-04-12 MED FILL — ENVARSUS XR 0.75 MG TABLET,EXTENDED RELEASE: ORAL | 30 days supply | Qty: 30 | Fill #8

## 2020-04-12 MED FILL — ALENDRONATE 70 MG TABLET: ORAL | 28 days supply | Qty: 4 | Fill #3

## 2020-04-12 MED FILL — MYCOPHENOLATE SODIUM 180 MG TABLET,DELAYED RELEASE: ORAL | 30 days supply | Qty: 60 | Fill #7

## 2020-04-12 MED FILL — ENVARSUS XR 1 MG TABLET,EXTENDED RELEASE: ORAL | 30 days supply | Qty: 30 | Fill #8

## 2020-04-12 MED FILL — AMLODIPINE 10 MG TABLET: ORAL | 30 days supply | Qty: 30 | Fill #9

## 2020-04-12 MED FILL — SERTRALINE 50 MG TABLET: ORAL | 30 days supply | Qty: 30 | Fill #6

## 2020-04-15 DIAGNOSIS — E612 Magnesium deficiency: Principal | ICD-10-CM

## 2020-04-15 DIAGNOSIS — Z944 Liver transplant status: Principal | ICD-10-CM

## 2020-04-15 DIAGNOSIS — Z5181 Encounter for therapeutic drug level monitoring: Principal | ICD-10-CM

## 2020-04-22 DIAGNOSIS — Z944 Liver transplant status: Principal | ICD-10-CM

## 2020-04-22 DIAGNOSIS — Z5181 Encounter for therapeutic drug level monitoring: Principal | ICD-10-CM

## 2020-04-22 DIAGNOSIS — E612 Magnesium deficiency: Principal | ICD-10-CM

## 2020-04-26 ENCOUNTER — Other Ambulatory Visit: Payer: Self-pay | Admitting: Family Medicine

## 2020-04-29 DIAGNOSIS — Z5181 Encounter for therapeutic drug level monitoring: Principal | ICD-10-CM

## 2020-04-29 DIAGNOSIS — Z944 Liver transplant status: Principal | ICD-10-CM

## 2020-04-29 DIAGNOSIS — E612 Magnesium deficiency: Principal | ICD-10-CM

## 2020-05-03 DIAGNOSIS — Z5181 Encounter for therapeutic drug level monitoring: Secondary | ICD-10-CM | POA: Diagnosis not present

## 2020-05-03 DIAGNOSIS — E612 Magnesium deficiency: Secondary | ICD-10-CM | POA: Diagnosis not present

## 2020-05-03 DIAGNOSIS — Z944 Liver transplant status: Secondary | ICD-10-CM | POA: Diagnosis not present

## 2020-05-04 LAB — COMPREHENSIVE METABOLIC PANEL
A/G RATIO: 1.8 (ref 1.2–2.2)
ALBUMIN: 4.7 g/dL (ref 3.8–4.9)
ALKALINE PHOSPHATASE: 70 IU/L (ref 44–121)
ALT (SGPT): 75 IU/L — ABNORMAL HIGH (ref 0–44)
AST (SGOT): 49 IU/L — ABNORMAL HIGH (ref 0–40)
BILIRUBIN TOTAL: 0.3 mg/dL (ref 0.0–1.2)
BLOOD UREA NITROGEN: 15 mg/dL (ref 6–24)
BUN / CREAT RATIO: 15 (ref 9–20)
CALCIUM: 9.2 mg/dL (ref 8.7–10.2)
CHLORIDE: 104 mmol/L (ref 96–106)
CO2: 20 mmol/L (ref 20–29)
CREATININE: 1.03 mg/dL (ref 0.76–1.27)
GLOBULIN, TOTAL: 2.6 g/dL (ref 1.5–4.5)
GLUCOSE: 141 mg/dL — ABNORMAL HIGH (ref 65–99)
POTASSIUM: 5.1 mmol/L (ref 3.5–5.2)
SODIUM: 141 mmol/L (ref 134–144)
TOTAL PROTEIN: 7.3 g/dL (ref 6.0–8.5)

## 2020-05-04 LAB — CBC W/ DIFFERENTIAL
BANDED NEUTROPHILS ABSOLUTE COUNT: 0.1 10*3/uL (ref 0.0–0.1)
BASOPHILS ABSOLUTE COUNT: 0.1 10*3/uL (ref 0.0–0.2)
BASOPHILS RELATIVE PERCENT: 1 %
EOSINOPHILS ABSOLUTE COUNT: 0.4 10*3/uL (ref 0.0–0.4)
EOSINOPHILS RELATIVE PERCENT: 5 %
HEMATOCRIT: 43.2 % (ref 37.5–51.0)
HEMOGLOBIN: 14.5 g/dL (ref 13.0–17.7)
IMMATURE GRANULOCYTES: 1 %
LYMPHOCYTES ABSOLUTE COUNT: 2.1 10*3/uL (ref 0.7–3.1)
LYMPHOCYTES RELATIVE PERCENT: 24 %
MEAN CORPUSCULAR HEMOGLOBIN CONC: 33.6 g/dL (ref 31.5–35.7)
MEAN CORPUSCULAR HEMOGLOBIN: 30.1 pg (ref 26.6–33.0)
MEAN CORPUSCULAR VOLUME: 90 fL (ref 79–97)
MONOCYTES ABSOLUTE COUNT: 0.9 10*3/uL (ref 0.1–0.9)
MONOCYTES RELATIVE PERCENT: 10 %
NEUTROPHILS ABSOLUTE COUNT: 5.3 10*3/uL (ref 1.4–7.0)
NEUTROPHILS RELATIVE PERCENT: 59 %
PLATELET COUNT: 233 10*3/uL (ref 150–450)
RED BLOOD CELL COUNT: 4.82 x10E6/uL (ref 4.14–5.80)
RED CELL DISTRIBUTION WIDTH: 13 % (ref 11.6–15.4)
WHITE BLOOD CELL COUNT: 8.8 10*3/uL (ref 3.4–10.8)

## 2020-05-04 LAB — GAMMA GT: GAMMA GLUTAMYL TRANSFERASE: 74 IU/L — ABNORMAL HIGH (ref 0–65)

## 2020-05-04 LAB — BILIRUBIN, DIRECT: BILIRUBIN DIRECT: 0.14 mg/dL (ref 0.00–0.40)

## 2020-05-04 LAB — PHOSPHORUS: PHOSPHORUS, SERUM: 2.4 mg/dL — ABNORMAL LOW (ref 2.8–4.1)

## 2020-05-04 LAB — MAGNESIUM: MAGNESIUM: 2 mg/dL (ref 1.6–2.3)

## 2020-05-06 DIAGNOSIS — Z944 Liver transplant status: Principal | ICD-10-CM

## 2020-05-06 DIAGNOSIS — Z5181 Encounter for therapeutic drug level monitoring: Principal | ICD-10-CM

## 2020-05-06 DIAGNOSIS — E612 Magnesium deficiency: Principal | ICD-10-CM

## 2020-05-06 LAB — TACROLIMUS LEVEL: TACROLIMUS BLOOD: 3.5 ng/mL (ref 2.0–20.0)

## 2020-05-06 MED ORDER — PRAVASTATIN 40 MG TABLET
ORAL_TABLET | Freq: Every evening | ORAL | 3 refills | 90 days
Start: 2020-05-06 — End: 2021-05-06

## 2020-05-06 NOTE — Unmapped (Signed)
Pt request for RX Refill pravastatin (PRAVACHOL) 40 MG tablet

## 2020-05-06 NOTE — Unmapped (Signed)
[facility] Clinical Assessment & Refill Coordination Note    Anthony Alvarado, [place]: March 12, 1960  Phone: [phone] (home)     All above HIPAA information was verified with patient.     Was a Nurse, learning disability used for this call? No    Specialty Medication(s):   Transplant: Envarsus 0.75mg , Envarsus 1mg  and  mycophenolic acid 180mg      Current Outpatient Medications   Medication Sig Dispense Refill   ??? alendronate (FOSAMAX) 70 MG tablet Take 1 tablet (70 mg total) by mouth every seven (7) days. 4 tablet 11   ??? amLODIPine (NORVASC) 10 MG tablet Take 1 tablet (10 mg total) by mouth daily. 30 tablet 11   ??? aspirin (ECOTRIN) 81 MG tablet Take 1 tablet (81 mg total) by mouth daily. 30 tablet 11   ??? azelastine (ASTELIN) 137 mcg (0.1 %) nasal spray 1 spray by Each Nare route daily as needed.      ??? blood sugar diagnostic (ONETOUCH ULTRA TEST) Strp USE TO CHECK SUGAR DAILY     ??? blood sugar diagnostic Strp Use as directed Three (3) times a day before meals. 90 each 11   ??? calcium carbonate (TUMS) 200 mg calcium (500 mg) chewable tablet Chew 1 tablet Three (3) times a day as needed for heartburn.      ??? ENVARSUS XR 1 mg Tb24 extended release tablet Take 1 tablet (1 mg total) by mouth daily with 1 (0.75 mg) tablet for a total dose of 1.75 mg daily 30 tablet 11   ??? fluticasone propionate (FLONASE) 50 mcg/actuation nasal spray PLACE ONE OR TWO SPRAYS INTO BOTH NOSTRILS DAILY AS NEEDED.     ??? lancets 33 gauge Misc 1 each by Miscellaneous route Three (3) times a day before meals. 100 each 11   ??? levothyroxine (SYNTHROID) 150 MCG tablet Take 1 tablet (150 mcg total) by mouth daily. 30 tablet 11   ??? metFORMIN (GLUCOPHAGE-XR) 500 MG 24 hr tablet Take 2 tablets (1,000 mg total) by mouth two (2) times a day. 120 tablet 11   ??? mycophenolate (MYFORTIC) 180 MG EC tablet Take 1 tablet (180 mg total) by mouth Two (2) times a day. 60 tablet 11   ??? pravastatin (PRAVACHOL) 40 MG tablet Take 1 tablet (40 mg total) by mouth every evening. 90 tablet 3   ??? sertraline (ZOLOFT) 50 MG tablet Take 1 tablet (50 mg total) by mouth daily. 30 tablet 11   ??? ENVARSUS XR 0.75 mg Tb24 extended release tablet Take 1 tablet (0.75 mg total) by mouth daily with 1 (1 mg) tablet for a total dose of 1.75 mg daily. 30 tablet 11   ??? testosterone (ANDROGEL) 1 % (25 mg/2.5gram) GlPk APPLY 2.5G (1 PACKET) TO EACH SHOULDER/UPPER ARM DAILY FOR A TOTAL DAILY DOSE OF 5G 150 packet 0     No current facility-administered medications for this visit.     Facility-Administered Medications Ordered in Other Visits   Medication Dose Route Frequency Provider Last Rate Last Admin   ??? diazePAM (VALIUM) 5 mg/mL injection                 Changes to medications: [name] reports no changes at this time.    Allergies   Allergen Reactions   ??? Watermelon Flavor      Mouth itching       Changes to allergies: No    SPECIALTY MEDICATION ADHERENCE     Envarsus Xr 0.75 mg: 7 days of  medicine on hand   Envarsus Xr 1 mg: 7 days of medicine on hand   Mycophenolate 180 mg: 7 days of medicine on hand       Medication Adherence    Patient reported X missed doses in the last month: 0  Specialty Medication: Envarsus Xr 0.75mg   Patient is on additional specialty medications: Yes  Additional Specialty Medications: Envarsus Xr 1mg   Patient Reported Additional Medication X Missed Doses in the Last Month: 0  Patient is on more than two specialty medications: Yes  Specialty Medication: Mycophenolate 180mg   Patient Reported Additional Medication X Missed Doses in the Last Month: 0          Specialty medication(s) dose(s) confirmed: Regimen is correct and unchanged.     Are there any concerns with adherence? No    Adherence counseling provided? Not needed    CLINICAL MANAGEMENT AND INTERVENTION      Clinical Benefit Assessment:    Do you feel the medicine is effective or helping your condition? Yes    Clinical Benefit counseling provided? Not needed    Adverse Effects Assessment:    Are you experiencing any side effects? No    Are you experiencing difficulty administering your medicine? No    Quality of Life Assessment:    How many days over the past month did your liver transplant  keep you from your normal activities? For example, brushing your teeth or getting up in the morning. 0    Have you discussed this with your provider? Not needed    Therapy Appropriateness:    Is therapy appropriate? Yes, therapy is appropriate and should be continued    DISEASE/MEDICATION-SPECIFIC INFORMATION      N/A    PATIENT SPECIFIC NEEDS     - Does the patient have any physical, cognitive, or cultural barriers? No    - Is the patient high risk? Yes, patient is taking a REMS drug. Medication is dispensed in compliance with REMS program    - Does the patient require a Care Management Plan? No     - Does the patient require physician intervention or other additional services (i.e. nutrition, smoking cessation, social work)? No      SHIPPING     Specialty Medication(s) to be Shipped:   Transplant: Envarsus 0.75mg , Envarsus 1mg  and  mycophenolic acid 180mg     Other medication(s) to be shipped: alendronate, amlodipine, metformin, sertraline, pravastatin     Changes to insurance: No    Delivery Scheduled: Yes, Expected medication delivery date: 05/09/20.     Medication will be delivered via UPS to the confirmed prescription address in [facility].    The patient will receive a drug information handout for each medication shipped and additional FDA Medication Guides as required.  Verified that patient has previously received a Conservation officer, historic buildings.    All of the patient's questions and concerns have been addressed.    [name]   [facility] Specialty Pharmacist

## 2020-05-08 NOTE — Unmapped (Signed)
Anthony Alvarado 's entire shipment will be delayed as a result of the medication is too soon to refill until 05/10/2020.     I have reached out to the patient  at [phone] and communicated the delivery change. We will reschedule the medication for the delivery date that the patient agreed upon.  We have confirmed the delivery date as 05/13/2020, via ups.

## 2020-05-10 MED FILL — AMLODIPINE 10 MG TABLET: ORAL | 30 days supply | Qty: 30 | Fill #10

## 2020-05-10 MED FILL — SERTRALINE 50 MG TABLET: ORAL | 30 days supply | Qty: 30 | Fill #7

## 2020-05-10 MED FILL — ALENDRONATE 70 MG TABLET: ORAL | 28 days supply | Qty: 4 | Fill #4

## 2020-05-10 MED FILL — ENVARSUS XR 1 MG TABLET,EXTENDED RELEASE: ORAL | 30 days supply | Qty: 30 | Fill #9

## 2020-05-10 MED FILL — MYCOPHENOLATE SODIUM 180 MG TABLET,DELAYED RELEASE: ORAL | 30 days supply | Qty: 60 | Fill #8

## 2020-05-10 MED FILL — ENVARSUS XR 0.75 MG TABLET,EXTENDED RELEASE: ORAL | 30 days supply | Qty: 30 | Fill #9

## 2020-05-10 MED FILL — METFORMIN ER 500 MG TABLET,EXTENDED RELEASE 24 HR: ORAL | 30 days supply | Qty: 120 | Fill #7

## 2020-05-13 ENCOUNTER — Other Ambulatory Visit: Payer: Self-pay | Admitting: Family Medicine

## 2020-05-13 DIAGNOSIS — Z5181 Encounter for therapeutic drug level monitoring: Principal | ICD-10-CM

## 2020-05-13 DIAGNOSIS — E612 Magnesium deficiency: Principal | ICD-10-CM

## 2020-05-13 DIAGNOSIS — Z944 Liver transplant status: Principal | ICD-10-CM

## 2020-05-19 NOTE — Progress Notes (Signed)
Cardiology Office Note   Date:  05/20/2020   ID:  [name], [name] June 15, 1960, MRN [id number]  PCP:  [name], MD  Cardiologist:   Dorris Carnes, MD   F/U of aortic stenosis.     History of Present Illness: [name] is a 60 y.o. male with a history of aortic stenosis, cirrhosis, HTN.[name]   As pretransplant evaluation in 2019 L heart cath  done   [facility] showed 40% D1, 30 to 40% D2; 40 to 50% OM2   LVEF normal   Mild AS     Echo in July 2020 showed Peak and mean gradients through the AV were 71 and 37 mm Hg   AVA 1.3cm2.  A limited echo in Nov 2020 showed no effusion. he was seen in Tx clinic at [facility] for spirometry which he had in Aug 2021    FEV1 was 2.59 L   FEF 25 to 75  1.71 L/s    I saw the pt in Sept 2021    Pt got COVID again in Jan 2022     The pt denies CP  Does say that he gets SOB walking , like coming in from parking lot   Says it has been this way for awhile  Says he gets dizzy at times   Unpredictable   Has had since his 76s  PT says he got COVID infection again in Jan 2022   Current Meds  Medication Sig  . acetaminophen (TYLENOL) 325 MG tablet Take 325 mg by mouth every 6 (six) hours as needed for mild pain.  [name] alendronate (FOSAMAX) 70 MG tablet Take 70 mg by mouth once a week. Take with a full glass of water on an empty stomach.  [name] amLODipine (NORVASC) 10 MG tablet Take 0.5 tablets (5 mg total) by mouth daily.  [name] aspirin EC 81 MG tablet Take 81 mg by mouth daily.  [name] azelastine (ASTELIN) 0.1 % nasal spray PLACE 2 SPRAYS INTO BOTH NOSTRILS TWICE DAILY AS NEEDED  . calcium carbonate (TUMS - DOSED IN MG ELEMENTAL CALCIUM) 500 MG chewable tablet Chew 1 tablet by mouth daily.  . cholecalciferol (VITAMIN D) 25 MCG (1000 UT) tablet Take 1,000 Units by mouth 2 (two) times daily.  . fluticasone (FLONASE) 50 MCG/ACT nasal spray PLACE ONE OR TWO SPRAYS INTO BOTH NOSTRILS DAILY AS NEEDED.  [name] levothyroxine (SYNTHROID) 150 MCG tablet TAKE 1 TABLET DAILY EXCEPT  FOR 1.5 TABLETS ON SUNDAYS.  [name] metFORMIN (GLUCOPHAGE-XR) 500 MG 24 hr tablet Take 1,000 mg by mouth in the morning and at bedtime.  . metroNIDAZOLE (METROGEL) 1 % gel Apply topically daily.  . mycophenolate (MYFORTIC) 180 MG EC tablet Take 1 tablet (180 mg total) by mouth 2 (two) times daily.  [name] Lancets 10C MISC USE TO CHECK SUGAR DAILY  . ONETOUCH ULTRA test strip USE TO CHECK SUGAR DAILY  . sertraline (ZOLOFT) 50 MG tablet Take 50 mg by mouth daily.  . Tacrolimus ER (ENVARSUS XR) 0.75 MG TB24 Take 1 tablet by mouth. Total of 1.75mg  a day  . Tacrolimus ER (ENVARSUS XR) 1 MG TB24 Take 1 tablet by mouth. Total of 1.75mg  a day  . Testosterone (ANDROGEL TD) Place onto the skin. Apply 5 grams daily     Allergies:   Watermelon flavor   Past Medical History:  Diagnosis Date  . Allergy   . Asthma   . Bicuspid aortic valve    sees  dr Harrington Challenger  . Cirrhosis ([place])   . Diabetes mellitus without complication ([place])   . Fatty liver    with h/o elevated LFT's  . GERD (gastroesophageal reflux disease)   . Heart murmur   . Hypertension   . Itching    all over last few months  . Jaundice   . Liver transplant recipient [facility])    [facility]  . Migraine with aura   . OSA (obstructive sleep apnea) 09/14/2011   PSG 11/08/11>>AHI 31.6, SpO2 low 85%. wears CPAP, pt does not know settings  . Other abnormal glucose    diet controlled diabetic  . Thyroid disease     Past Surgical History:  Procedure Laterality Date  . APPENDECTOMY  10/09   Emergency  . BIOPSY THYROID  08/19/07   Attempted, no tissue obtained  . CARDIAC CATHETERIZATION  03/21/2018  . CARDIOVASCULAR STRESS TEST  03/07   Negative 06/05  . DOPPLER ECHOCARDIOGRAPHY  06/04  . ESOPHAGEAL BANDING N/A 05/04/2016   Procedure: ESOPHAGEAL BANDING;  Surgeon: [name], MD;  Location: [facility];  Service: Endoscopy;  Laterality: N/A;  . ESOPHAGOGASTRODUODENOSCOPY (EGD) WITH PROPOFOL N/A 05/04/2016   Procedure:  ESOPHAGOGASTRODUODENOSCOPY (EGD) WITH PROPOFOL;  Surgeon: [name], MD;  Location: [facility];  Service: Endoscopy;  Laterality: N/A;  . LIVER TRANSPLANT     09/16/2018 at [place] History:  The patient  reports that he has never smoked. He has never used smokeless tobacco. He reports that he does not drink alcohol and does not use drugs.   Family History:  The patient's family history includes Asthma in his mother; COPD in his father; Cancer in his father; Colon cancer in his maternal grandmother; Heart disease in his maternal grandfather and sister; Liver disease in his sister; Prostate cancer in his maternal uncle.    ROS:  Please see the history of present illness. All other systems are reviewed and  Negative to the above problem except as noted.    PHYSICAL EXAM: VS:  BP 124/84   Pulse 78   Ht 5' 8.5" (1.74 m)   Wt 213 lb (96.6 kg)   SpO2 98%   BMI 31.92 kg/m   GEN: Obese 60 yo in no acute distress  Neck: JVP not elevated   No carotid bruits Cardiac: RRR  Gr III/VI systolic murmur RUSB  No LE  edema  Respiratory:  clear to auscultation bilaterally,  GI: soft, nontender, nondistended, + BS  No hepatomegaly  MS: no deformity Moving all extremities   Skin: warm and dry, no rash  Neuro:  Strength and sensation are intact Psych: euthymic mood, full affect   EKG:  EKG is  ordered today.    SR 76 bpm  LVH.  Nonspecific ST changes    Compared to echo repot from 2019, mean gradient through AV is decreased but LVOT/AV VTI ratio is the same consistent with moderate to severe AS. 2. Left ventricular ejection fraction, by estimation, is 55 to 60%. The left ventricle has normal function. The left ventricle demonstrates regional wall motion abnormalities (see scoring diagram/findings for description). There is moderate left ventricular hypertrophy. Left ventricular diastolic parameters are consistent with Grade I diastolic dysfunction (impaired relaxation). 3. Right  ventricular systolic function is normal. The right ventricular size is normal. 4. The mitral valve is normal in structure. Trivial mitral valve regurgitation. 5. AV is thickened, calcified with restricted motion. Peak and mean gradients through the valve are 38  adn 25 mm Hg respectively. LVOT / AV VTI ratio is 9.28 consistent with moderate to severe AS. [name] The aortic valve is abnormal. Aortic valve regurgitation is not visualized. Moderate to severe aortic valve stenosis. 6. The inferior vena cava is normal in size with greater than 50% respiratory variability, suggesting right atrial pressure of 3 mmHg.   Lipid Panel    Component Value Date/Time   CHOL 145 05/20/2020 0921   TRIG 565 (HH) 05/20/2020 0921   HDL 32 (L) 05/20/2020 0921   CHOLHDL 4.5 05/20/2020 0921   CHOLHDL 2 11/03/2019 0849   VLDL 40.4 (H) 11/03/2019 0849   LDLCALC 35 05/20/2020 0921   LDLDIRECT 25.0 11/03/2019 0849      Wt Readings from Last 3 Encounters:  05/20/20 213 lb (96.6 kg)  11/17/19 212 lb 12.8 oz (96.5 kg)  11/10/19 205 lb 8 oz (93.2 kg)      ASSESSMENT AND PLAN:  1  Aortic stenosis   Will repeat echo to reeval AV   Pt with dyspnea.   He says it has been chronic but may be more prominent than 1 year ago 2  CAD   Mild at cath in 2019  I am not convinced symptoms reflect angina  Follow  3  Lipids Will get lipids today  HE is off of pravasttin  4  Dizziness   Follow   I did not check orthostatics  Stay hydrated  5   Hx liver transplant  Follows at [facility]             Current medicines are reviewed at length with the patient today.  The patient does not have concerns regarding medicines.  Signed, Dorris Carnes, MD  05/20/2020 7:10 PM    [address], [place], [place]  [address] Phone: [phone]; Fax: [phone]

## 2020-05-20 ENCOUNTER — Other Ambulatory Visit: Payer: Self-pay

## 2020-05-20 ENCOUNTER — Ambulatory Visit: Payer: BC Managed Care – PPO | Admitting: Internal Medicine

## 2020-05-20 ENCOUNTER — Encounter: Payer: Self-pay | Admitting: Internal Medicine

## 2020-05-20 VITALS — BP 124/84 | HR 78 | Ht 68.5 in | Wt 213.0 lb

## 2020-05-20 DIAGNOSIS — E612 Magnesium deficiency: Principal | ICD-10-CM

## 2020-05-20 DIAGNOSIS — Z944 Liver transplant status: Principal | ICD-10-CM

## 2020-05-20 DIAGNOSIS — Z5181 Encounter for therapeutic drug level monitoring: Principal | ICD-10-CM

## 2020-05-20 DIAGNOSIS — I350 Nonrheumatic aortic (valve) stenosis: Secondary | ICD-10-CM | POA: Diagnosis not present

## 2020-05-20 DIAGNOSIS — E785 Hyperlipidemia, unspecified: Secondary | ICD-10-CM | POA: Diagnosis not present

## 2020-05-20 LAB — HEPATIC FUNCTION PANEL
ALT: 57 IU/L — ABNORMAL HIGH (ref 0–44)
AST: 36 IU/L (ref 0–40)
Albumin: 4.8 g/dL (ref 3.8–4.9)
Alkaline Phosphatase: 74 IU/L (ref 44–121)
Bilirubin Total: 0.3 mg/dL (ref 0.0–1.2)
Bilirubin, Direct: 0.11 mg/dL (ref 0.00–0.40)
Total Protein: 7.6 g/dL (ref 6.0–8.5)

## 2020-05-20 LAB — LIPID PANEL
Chol/HDL Ratio: 4.5 ratio (ref 0.0–5.0)
Cholesterol, Total: 145 mg/dL (ref 100–199)
HDL: 32 mg/dL — ABNORMAL LOW (ref >39)
LDL Chol Calc (NIH): 35 mg/dL (ref 0–99)
Triglycerides: 565 mg/dL (ref 0–149)
VLDL Cholesterol Cal: 78 mg/dL — ABNORMAL HIGH (ref 5–40)

## 2020-05-20 NOTE — Patient Instructions (Signed)
Medication Instructions:  No changes *If you need a refill on your cardiac medications before your next appointment, please call your pharmacy*   Lab Work: Today: LIPIDS/LIVER FUNCTION  If you have labs (blood work) drawn today and your tests are completely normal, you will receive your results only by: [name] MyChart Message (if you have MyChart) OR . A paper copy in the mail If you have any lab test that is abnormal or we need to change your treatment, we will call you to review the results.   Testing/Procedures: Your physician has requested that you have an echocardiogram. Echocardiography is a painless test that uses sound waves to create images of your heart. It provides your doctor with information about the size and shape of your heart and how well your heart's chambers and valves are working. This procedure takes approximately one hour. There are no restrictions for this procedure.   Follow-Up: At [facility], you and your health needs are our priority.  As part of our continuing mission to provide you with exceptional heart care, we have created designated Provider Care Teams.  These Care Teams include your primary Cardiologist (physician) and Advanced Practice Providers (APPs -  Physician Assistants and Nurse Practitioners) who all work together to provide you with the care you need, when you need it.   Your next appointment:   9 month(s)  The format for your next appointment:   In Person  Provider:   You may see Dorris Carnes, MD or one of the following Advanced Practice Providers on your designated Care Team:    Richardson Dopp, PA-C  Robbie Lis, [place]   Other Instructions

## 2020-05-21 DIAGNOSIS — G4733 Obstructive sleep apnea (adult) (pediatric): Secondary | ICD-10-CM | POA: Diagnosis not present

## 2020-05-27 DIAGNOSIS — E612 Magnesium deficiency: Principal | ICD-10-CM

## 2020-05-27 DIAGNOSIS — Z944 Liver transplant status: Principal | ICD-10-CM

## 2020-05-27 DIAGNOSIS — Z5181 Encounter for therapeutic drug level monitoring: Principal | ICD-10-CM

## 2020-05-28 ENCOUNTER — Other Ambulatory Visit: Payer: Self-pay | Admitting: *Deleted

## 2020-05-28 DIAGNOSIS — E785 Hyperlipidemia, unspecified: Secondary | ICD-10-CM

## 2020-05-30 NOTE — Unmapped (Signed)
[facility] Refill Coordination Note    Specialty Medication(s) to be Shipped:   Transplant: Envarsus 0.75mg , Envarsus 1mg  and mycophenolate mofetil 180mg     Other medication(s) to be shipped: metformin, sertraline, alendronate and amlodipine     Anthony Alvarado, DOB: 1960-11-08  Phone: [phone] (home)       All above HIPAA information was verified with patient.     Was a Nurse, learning disability used for this call? No    Completed refill call assessment today to schedule patient's medication shipment from the [facility] [phone]).       Specialty medication(s) and dose(s) confirmed: Regimen is correct and unchanged.   Changes to medications: Anthony Alvarado reports no changes at this time.  Changes to insurance: No  Questions for the pharmacist: No    Confirmed patient received a Conservation officer, historic buildings and a Surveyor, mining with first shipment. The patient will receive a drug information handout for each medication shipped and additional FDA Medication Guides as required.       DISEASE/MEDICATION-SPECIFIC INFORMATION        N/A    SPECIALTY MEDICATION ADHERENCE     Medication Adherence    Patient reported X missed doses in the last month: 2  Specialty Medication: Envarsus 0.75mg   Patient is on additional specialty medications: Yes  Additional Specialty Medications: Envarsus 1mg   Patient Reported Additional Medication X Missed Doses in the Last Month: 2  Patient is on more than two specialty medications: Yes  Specialty Medication: Mycophenolate 180mg   Patient Reported Additional Medication X Missed Doses in the Last Month: 2        Envarsus 0.75 mg: 14 days of medicine on hand   Envarsus 1 mg: 14 days of medicine on hand   Mycophenolate 180 mg: 14 days of medicine on hand     SHIPPING     Shipping address confirmed in Epic.     Delivery Scheduled: Yes, Expected medication delivery date: 06/11/2020.     Medication will be delivered via UPS to the prescription address in Epic W[facility].    Lorelei Pont [facility] Specialty Technician

## 2020-06-03 DIAGNOSIS — Z5181 Encounter for therapeutic drug level monitoring: Principal | ICD-10-CM

## 2020-06-03 DIAGNOSIS — E612 Magnesium deficiency: Principal | ICD-10-CM

## 2020-06-03 DIAGNOSIS — Z944 Liver transplant status: Principal | ICD-10-CM

## 2020-06-06 DIAGNOSIS — E612 Magnesium deficiency: Secondary | ICD-10-CM | POA: Diagnosis not present

## 2020-06-06 DIAGNOSIS — Z944 Liver transplant status: Secondary | ICD-10-CM | POA: Diagnosis not present

## 2020-06-06 DIAGNOSIS — Z5181 Encounter for therapeutic drug level monitoring: Secondary | ICD-10-CM | POA: Diagnosis not present

## 2020-06-07 LAB — CBC W/ DIFFERENTIAL
BANDED NEUTROPHILS ABSOLUTE COUNT: 0 10*3/uL (ref 0.0–0.1)
BASOPHILS ABSOLUTE COUNT: 0.1 10*3/uL (ref 0.0–0.2)
BASOPHILS RELATIVE PERCENT: 1 %
EOSINOPHILS ABSOLUTE COUNT: 0.4 10*3/uL (ref 0.0–0.4)
EOSINOPHILS RELATIVE PERCENT: 4 %
HEMATOCRIT: 44.8 % (ref 37.5–51.0)
HEMOGLOBIN: 15.1 g/dL (ref 13.0–17.7)
IMMATURE GRANULOCYTES: 1 %
LYMPHOCYTES ABSOLUTE COUNT: 2.1 10*3/uL (ref 0.7–3.1)
LYMPHOCYTES RELATIVE PERCENT: 24 %
MEAN CORPUSCULAR HEMOGLOBIN CONC: 33.7 g/dL (ref 31.5–35.7)
MEAN CORPUSCULAR HEMOGLOBIN: 30 pg (ref 26.6–33.0)
MEAN CORPUSCULAR VOLUME: 89 fL (ref 79–97)
MONOCYTES ABSOLUTE COUNT: 0.7 10*3/uL (ref 0.1–0.9)
MONOCYTES RELATIVE PERCENT: 9 %
NEUTROPHILS ABSOLUTE COUNT: 5.3 10*3/uL (ref 1.4–7.0)
NEUTROPHILS RELATIVE PERCENT: 61 %
PLATELET COUNT: 229 10*3/uL (ref 150–450)
RED BLOOD CELL COUNT: 5.04 x10E6/uL (ref 4.14–5.80)
RED CELL DISTRIBUTION WIDTH: 13.1 % (ref 11.6–15.4)
WHITE BLOOD CELL COUNT: 8.7 10*3/uL (ref 3.4–10.8)

## 2020-06-07 LAB — BILIRUBIN, DIRECT: BILIRUBIN DIRECT: 0.11 mg/dL (ref 0.00–0.40)

## 2020-06-07 LAB — PHOSPHORUS: PHOSPHORUS, SERUM: 3.2 mg/dL (ref 2.8–4.1)

## 2020-06-07 LAB — COMPREHENSIVE METABOLIC PANEL
A/G RATIO: 1.9 (ref 1.2–2.2)
ALBUMIN: 4.9 g/dL (ref 3.8–4.9)
ALKALINE PHOSPHATASE: 78 IU/L (ref 44–121)
ALT (SGPT): 70 IU/L — ABNORMAL HIGH (ref 0–44)
AST (SGOT): 40 IU/L (ref 0–40)
BILIRUBIN TOTAL: 0.3 mg/dL (ref 0.0–1.2)
BLOOD UREA NITROGEN: 20 mg/dL (ref 6–24)
BUN / CREAT RATIO: 18 (ref 9–20)
CALCIUM: 9.7 mg/dL (ref 8.7–10.2)
CHLORIDE: 98 mmol/L (ref 96–106)
CO2: 21 mmol/L (ref 20–29)
CREATININE: 1.13 mg/dL (ref 0.76–1.27)
GLOBULIN, TOTAL: 2.6 g/dL (ref 1.5–4.5)
GLUCOSE: 135 mg/dL — ABNORMAL HIGH (ref 65–99)
POTASSIUM: 4.6 mmol/L (ref 3.5–5.2)
SODIUM: 134 mmol/L (ref 134–144)
TOTAL PROTEIN: 7.5 g/dL (ref 6.0–8.5)

## 2020-06-07 LAB — GAMMA GT: GAMMA GLUTAMYL TRANSFERASE: 60 IU/L (ref 0–65)

## 2020-06-07 LAB — MAGNESIUM: MAGNESIUM: 1.7 mg/dL (ref 1.6–2.3)

## 2020-06-09 LAB — TACROLIMUS LEVEL: TACROLIMUS BLOOD: 3.5 ng/mL (ref 2.0–20.0)

## 2020-06-10 DIAGNOSIS — Z79899 Other long term (current) drug therapy: Principal | ICD-10-CM

## 2020-06-10 DIAGNOSIS — Z944 Liver transplant status: Principal | ICD-10-CM

## 2020-06-10 DIAGNOSIS — E612 Magnesium deficiency: Principal | ICD-10-CM

## 2020-06-10 DIAGNOSIS — Z5181 Encounter for therapeutic drug level monitoring: Principal | ICD-10-CM

## 2020-06-10 MED ORDER — ENVARSUS XR 1 MG TABLET,EXTENDED RELEASE
ORAL_TABLET | Freq: Every day | ORAL | 11 refills | 30 days | Status: CP
Start: 2020-06-10 — End: ?
  Filled 2020-06-10: qty 60, 30d supply, fill #0

## 2020-06-10 MED ORDER — MYCOPHENOLATE MOFETIL 250 MG CAPSULE
ORAL_CAPSULE | Freq: Two times a day (BID) | ORAL | 11 refills | 30.00000 days | Status: CP
Start: 2020-06-10 — End: 2020-06-10
  Filled 2020-06-10: qty 60, 30d supply, fill #0

## 2020-06-10 MED FILL — SERTRALINE 50 MG TABLET: ORAL | 30 days supply | Qty: 30 | Fill #8

## 2020-06-10 MED FILL — METFORMIN ER 500 MG TABLET,EXTENDED RELEASE 24 HR: ORAL | 30 days supply | Qty: 120 | Fill #8

## 2020-06-10 MED FILL — AMLODIPINE 10 MG TABLET: ORAL | 30 days supply | Qty: 30 | Fill #11

## 2020-06-10 MED FILL — ALENDRONATE 70 MG TABLET: ORAL | 28 days supply | Qty: 4 | Fill #5

## 2020-06-10 NOTE — Unmapped (Signed)
Patient's 4/14 tac level again subpar. Spoke with patient who confirmed he was taking the prescribed 1.75mg  Envarsus dose and that the troughs were reliable. He also c/o myfortic copay now costing him $90 monthly, which was not affordable. Discussed all with txp pharmd Christena Deem, who recommended he increase his Envarsus dose to 2mg  daily and switch to cellcept 250mg  bid, adding that if the latter was not more affordable, then will send to MAPs team. Relayed envarsus dose change to patient and plan to switch to generic cellcept to see if it is affordable. Also requested he repeat labs weekly x 2, beginning next week, to confirm envarsus is at therapeutic level.

## 2020-06-10 NOTE — Unmapped (Signed)
This onboarding is for the following medication:  1) Cellcept      [facility]   Patient Onboarding/Medication Counseling    [name] is a 60 y.o. male with a liver transplant who I am counseling today on initiation of therapy.  I am speaking to the patient.    Was a Nurse, learning disability used for this call? No    Verified patient's date of birth / HIPAA.    Specialty medication(s) to be sent: Transplant: mycophenolate mofetil 250mg       Non-specialty medications/supplies to be sent: none      Medications not needed at this time: none     The patient declined counseling on missed dose instructions, goals of therapy, warnings and precautions, drug/food interactions and storage, handling precautions, and disposal because perviously was on mycophenolate 180mg , now using 250mg  mycophenolate becasue it is less expensive. The information in the declined sections below are for informational purposes only and was not discussed with patient.       Cellcept (mycopheonlate mofetil)    Medication & Administration     Dosage: Take 1 capsule (250mg ) by mouth two times daily    Administration: Take by mouth with or without food. Taking with food can minimize GI side effects. Swallow capsules whole, do not crush or chew.    Adherence/Missed dose instructions:  Take a missed dose as soon as you remember it . If it is close to the time of your next dose, skip the missed dose and resume your normal schedule.Never take 2 doses to try and catch up from a missed dose.    Goals of Therapy     Prevent organ rejection    Side Effects & Monitoring Parameters     ??? Feeling tired or weak  ??? Shakiness  ??? Trouble sleeping  ??? Diarrhea, abdominal pain, nausea, vomiting, constipation or decreased appetite  ??? Decreases in blood counts   ??? Back or joint pain  ??? Hypertension or hypotension  ??? High blood sugar  ??? Headache  ??? Skin rash    The following side effects should be reported to the provider:  ??? Reduced immune function ??? report signs of infection such as fever; chills; body aches; very bad sore throat; ear or sinus pain; cough; more sputum or change in color of sputum; pain with passing urine; wound that will not heal, etc.  Also at a slightly higher risk of some malignancies (mainly skin and blood cancers) due to this reduced immune function.  ??? Allergic reaction (rash, hives, swelling, shortness of breath)  ??? High blood sugar (confusion, feeling sleepy, more thirst, more hungry, passing urine more often, flushing, fast breathing, or breath that smells like fruit)  ??? Electrolyte issues (mood changes, confusion, muscle pain or weakness, a heartbeat that does not feel normal, seizures, not hungry, or very bad upset stomach or throwing up)  ??? High or low blood pressure (bad headache or dizziness, passing out, or change in eyesight)  ??? Kidney issues (unable to pass urine, change in how much urine is passed, blood in the urine, or a big weight gain)  ??? Skin (oozing, heat, swelling, redness, or pain), UTI and other infections   ??? Chest pain or pressure  ??? Abnormal heartbeat  ??? Unexplained bleeding or bruising  ??? Abnormal burning, numbness, or tingling  ??? Muscle cramps,  ??? Yellowing of skin or eyes    Monitoring parameters  ??? Pregnancy   ??? CBC   ??? Renal and  hepatic function    Contraindications, Warnings, & Precautions     ??? *This is a REMS drug and an FDA-approved patient medication guide will be printed with each dispensation  ??? Black Box Warning: Infections   ??? Black Box Warning: Lymphoproliferative disorders - risk of development of lymphoma and skin malignancy is increased  ??? Black Box Warning: Use during pregnancy is associated with increased risks of first trimester pregnancy loss and congenital malformations.   ??? Black Box Warning: Females of reproductive potential should use contraception during treatment and for 6 weeks after therapy is discontinued  ??? CNS depression  ??? New or reactivated viral infections  ??? Neutropenia  ??? Male patients and/or their male partners should use effective contraception during treatment of the male patient and for at least 3 months after last dose.  ??? Breastfeeding is not recommended during therapy and for 6 weeks after last dose    Drug/Food Interactions     ??? Medication list reviewed in Epic. The patient was instructed to inform the care team before taking any new medications or supplements. No drug interactions identified.   ??? Separate doses of antacids and this medication  ??? Check with your doctor before getting any vaccinations    Storage, Handling Precautions, & Disposal     ??? Store at room temperature in a dry place  ??? This medication is considered hazardous. Wash hands after handling and store out of reach or others, including children and pets.        Current Medications (including OTC/herbals), Comorbidities and Allergies     Current Outpatient Medications   Medication Sig Dispense Refill   ??? alendronate (FOSAMAX) 70 MG tablet Take 1 tablet (70 mg total) by mouth every seven (7) days. 4 tablet 11   ??? amLODIPine (NORVASC) 10 MG tablet Take 1 tablet (10 mg total) by mouth daily. 30 tablet 11   ??? aspirin (ECOTRIN) 81 MG tablet Take 1 tablet (81 mg total) by mouth daily. 30 tablet 11   ??? azelastine (ASTELIN) 137 mcg (0.1 %) nasal spray 1 spray by Each Nare route daily as needed.      ??? blood sugar diagnostic (ONETOUCH ULTRA TEST) Strp USE TO CHECK SUGAR DAILY     ??? blood sugar diagnostic Strp Use as directed Three (3) times a day before meals. 90 each 11   ??? calcium carbonate (TUMS) 200 mg calcium (500 mg) chewable tablet Chew 1 tablet Three (3) times a day as needed for heartburn.      ??? ENVARSUS XR 1 mg Tb24 extended release tablet Take 2 tablets (2 mg total) by mouth daily. 60 tablet 11   ??? fluticasone propionate (FLONASE) 50 mcg/actuation nasal spray PLACE ONE OR TWO SPRAYS INTO BOTH NOSTRILS DAILY AS NEEDED.     ??? lancets 33 gauge Misc 1 each by Miscellaneous route Three (3) times a day before meals. 100 each 11   ??? levothyroxine (SYNTHROID) 150 MCG tablet Take 1 tablet (150 mcg total) by mouth daily. 30 tablet 11   ??? metFORMIN (GLUCOPHAGE-XR) 500 MG 24 hr tablet Take 2 tablets (1,000 mg total) by mouth two (2) times a day. 120 tablet 11   ??? mycophenolate (CELLCEPT) 250 mg capsule Take 1 capsule (250 mg total) by mouth Two (2) times a day. 60 capsule 11   ??? pravastatin (PRAVACHOL) 40 MG tablet Take 1 tablet (40 mg total) by mouth every evening. 90 tablet 3   ??? sertraline (ZOLOFT) 50 MG tablet Take  1 tablet (50 mg total) by mouth daily. 30 tablet 11   ??? testosterone (ANDROGEL) 1 % (25 mg/2.5gram) GlPk APPLY 2.5G (1 PACKET) TO EACH SHOULDER/UPPER ARM DAILY FOR A TOTAL DAILY DOSE OF 5G 150 packet 0     No current facility-administered medications for this visit.     Facility-Administered Medications Ordered in Other Visits   Medication Dose Route Frequency Provider Last Rate Last Admin   ??? diazePAM (VALIUM) 5 mg/mL injection                Allergies   Allergen Reactions   ??? Watermelon Flavor      Mouth itching       Patient Active Problem List   Diagnosis   ??? Diabetes mellitus without complication (CMS-HCC)   ??? Hypothyroidism   ??? Cirrhosis of liver without ascites (CMS-HCC)   ??? OSA (obstructive sleep apnea)   ??? Essential hypertension   ??? Encounter for pre-transplant evaluation for chronic liver disease   ??? Pleural effusion   ??? Pneumothorax after biopsy   ??? Liver transplant recipient (CMS-HCC)   ??? Allergic rhinitis   ??? Arthralgia   ??? Asthma   ??? Bicuspid aortic valve   ??? Flying phobia   ??? Mucosal abnormality of stomach   ??? Nephrolithiasis   ??? Pure hypercholesterolemia   ??? Umbilical hernia   ??? Unspecified hearing loss   ??? COVID-19       Reviewed and up to date in Epic.    Appropriateness of Therapy     Acute infections noted within Epic:  No active infections  Patient reported infection: None    Is medication and dose appropriate based on diagnosis and infection status? Yes    Prescription has been clinically reviewed: Yes      Baseline Quality of Life Assessment      How many days over the past month did your liver transplant  keep you from your normal activities? For example, brushing your teeth or getting up in the morning. 0    Financial Information     Medication Assistance provided: None Required    Anticipated copay of $0 reviewed with patient. Verified delivery address.    Delivery Information     Scheduled delivery date: 06/11/20    Expected start date: 06/11/20    Medication will be delivered via UPS to the prescription address in [facility].  This shipment will not require a signature.      Explained the services we provide at [facility] and that each month we would call to set up refills.  Stressed importance of returning phone calls so that we could ensure they receive their medications in time each month.  Informed patient that we should be setting up refills 7-10 days prior to when they will run out of medication.  A pharmacist will reach out to perform a clinical assessment periodically.  Informed patient that a welcome packet, containing information about our pharmacy and other support services, a Notice of Privacy Practices, and a drug information handout will be sent.      Patient verbalized understanding of the above information as well as how to contact the pharmacy at [phone] option 4 with any questions/concerns.  The pharmacy is open Monday through Friday 8:30am-4:30pm.  A pharmacist is available 24/7 via pager to answer any clinical questions they may have.    Patient Specific Needs     - Does the patient have any physical, cognitive, or cultural  barriers? No    - Patient prefers to have medications discussed with  Patient     - Is the patient or caregiver able to read and understand education materials at a high school level or above? Yes    - Patient's primary language is  English     - Is the patient high risk? Yes, patient is taking a REMS drug. Medication is dispensed in compliance with REMS program    - Does the patient require a Care Management Plan? No     - Does the patient require physician intervention or other additional services (i.e. nutrition, smoking cessation, social work)? No      [name]  [facility] Specialty Pharmacist

## 2020-06-10 NOTE — Unmapped (Addendum)
Clinical Assessment Needed For: Dose Change  Medication: Envarsus XR 1mg  tablet  Last Fill Date/Day Supply: 05/10/2020 / 30 days  Copay $0  Was previous dose already scheduled to fill: Yes    Notes to Pharmacist: Scheduled originally to fill today 04/18 along with mycophenolate.    [facility] Specialty Medication Onboarding    Specialty Medication: Mycophenolate 250mg  capsule  Prior Authorization: Not Required   Financial Assistance: No - copay  <$25  Final Copay/Day Supply: $0 / 30 days    Insurance Restrictions: None     Notes to Pharmacist: Per Rx comments: Myfortic copay too high, switching to cellcept.    The triage team has completed the benefits investigation and has determined that the patient is able to fill this medication at [facility]. Please contact the patient to complete the onboarding or follow up with the prescribing physician as needed.

## 2020-06-17 ENCOUNTER — Other Ambulatory Visit: Payer: Self-pay | Admitting: Family Medicine

## 2020-06-17 DIAGNOSIS — Z5181 Encounter for therapeutic drug level monitoring: Principal | ICD-10-CM

## 2020-06-17 DIAGNOSIS — Z944 Liver transplant status: Principal | ICD-10-CM

## 2020-06-17 DIAGNOSIS — E612 Magnesium deficiency: Principal | ICD-10-CM

## 2020-06-17 DIAGNOSIS — E039 Hypothyroidism, unspecified: Secondary | ICD-10-CM

## 2020-06-18 ENCOUNTER — Ambulatory Visit (HOSPITAL_COMMUNITY): Payer: BC Managed Care – PPO | Attending: Cardiology

## 2020-06-18 ENCOUNTER — Other Ambulatory Visit: Payer: Self-pay

## 2020-06-18 DIAGNOSIS — E785 Hyperlipidemia, unspecified: Secondary | ICD-10-CM | POA: Diagnosis not present

## 2020-06-18 DIAGNOSIS — I350 Nonrheumatic aortic (valve) stenosis: Secondary | ICD-10-CM

## 2020-06-18 LAB — ECHOCARDIOGRAM COMPLETE
AR max vel: 0.84 cm2
AV Area VTI: 1.16 cm2
AV Area mean vel: 0.84 cm2
AV Mean grad: 27 mmHg
AV Peak grad: 47.1 mmHg
Ao pk vel: 3.43 m/s
Area-P 1/2: 3.17 cm2
S' Lateral: 3.1 cm

## 2020-06-20 ENCOUNTER — Other Ambulatory Visit: Payer: Self-pay

## 2020-06-20 DIAGNOSIS — I350 Nonrheumatic aortic (valve) stenosis: Secondary | ICD-10-CM

## 2020-06-24 DIAGNOSIS — Z944 Liver transplant status: Principal | ICD-10-CM

## 2020-06-24 DIAGNOSIS — E612 Magnesium deficiency: Principal | ICD-10-CM

## 2020-06-24 DIAGNOSIS — Z5181 Encounter for therapeutic drug level monitoring: Principal | ICD-10-CM

## 2020-06-25 DIAGNOSIS — E612 Magnesium deficiency: Secondary | ICD-10-CM | POA: Diagnosis not present

## 2020-06-25 DIAGNOSIS — Z944 Liver transplant status: Secondary | ICD-10-CM | POA: Diagnosis not present

## 2020-06-25 DIAGNOSIS — Z5181 Encounter for therapeutic drug level monitoring: Secondary | ICD-10-CM | POA: Diagnosis not present

## 2020-06-25 LAB — CBC W/ DIFFERENTIAL
BANDED NEUTROPHILS ABSOLUTE COUNT: 0.1 10*3/uL (ref 0.0–0.1)
BASOPHILS ABSOLUTE COUNT: 0.1 10*3/uL (ref 0.0–0.2)
BASOPHILS RELATIVE PERCENT: 1 %
EOSINOPHILS ABSOLUTE COUNT: 0.4 10*3/uL (ref 0.0–0.4)
EOSINOPHILS RELATIVE PERCENT: 5 %
HEMATOCRIT: 44.3 % (ref 37.5–51.0)
HEMOGLOBIN: 14.6 g/dL (ref 13.0–17.7)
IMMATURE GRANULOCYTES: 1 %
LYMPHOCYTES ABSOLUTE COUNT: 2.1 10*3/uL (ref 0.7–3.1)
LYMPHOCYTES RELATIVE PERCENT: 23 %
MEAN CORPUSCULAR HEMOGLOBIN CONC: 33 g/dL (ref 31.5–35.7)
MEAN CORPUSCULAR HEMOGLOBIN: 29.7 pg (ref 26.6–33.0)
MEAN CORPUSCULAR VOLUME: 90 fL (ref 79–97)
MONOCYTES ABSOLUTE COUNT: 0.9 10*3/uL (ref 0.1–0.9)
MONOCYTES RELATIVE PERCENT: 10 %
NEUTROPHILS ABSOLUTE COUNT: 5.4 10*3/uL (ref 1.4–7.0)
NEUTROPHILS RELATIVE PERCENT: 60 %
PLATELET COUNT: 231 10*3/uL (ref 150–450)
RED BLOOD CELL COUNT: 4.91 x10E6/uL (ref 4.14–5.80)
RED CELL DISTRIBUTION WIDTH: 13 % (ref 11.6–15.4)
WHITE BLOOD CELL COUNT: 9 10*3/uL (ref 3.4–10.8)

## 2020-06-26 LAB — PHOSPHORUS: PHOSPHORUS, SERUM: 2.7 mg/dL — ABNORMAL LOW (ref 2.8–4.1)

## 2020-06-26 LAB — COMPREHENSIVE METABOLIC PANEL
A/G RATIO: 1.7 (ref 1.2–2.2)
ALBUMIN: 4.7 g/dL (ref 3.8–4.9)
ALKALINE PHOSPHATASE: 77 IU/L (ref 44–121)
ALT (SGPT): 64 IU/L — ABNORMAL HIGH (ref 0–44)
AST (SGOT): 45 IU/L — ABNORMAL HIGH (ref 0–40)
BILIRUBIN TOTAL: 0.4 mg/dL (ref 0.0–1.2)
BLOOD UREA NITROGEN: 18 mg/dL (ref 6–24)
BUN / CREAT RATIO: 16 (ref 9–20)
CALCIUM: 9.8 mg/dL (ref 8.7–10.2)
CHLORIDE: 103 mmol/L (ref 96–106)
CO2: 20 mmol/L (ref 20–29)
CREATININE: 1.1 mg/dL (ref 0.76–1.27)
GLOBULIN, TOTAL: 2.8 g/dL (ref 1.5–4.5)
GLUCOSE: 141 mg/dL — ABNORMAL HIGH (ref 65–99)
POTASSIUM: 4.9 mmol/L (ref 3.5–5.2)
SODIUM: 139 mmol/L (ref 134–144)
TOTAL PROTEIN: 7.5 g/dL (ref 6.0–8.5)

## 2020-06-26 LAB — MAGNESIUM: MAGNESIUM: 1.9 mg/dL (ref 1.6–2.3)

## 2020-06-26 LAB — GAMMA GT: GAMMA GLUTAMYL TRANSFERASE: 73 IU/L — ABNORMAL HIGH (ref 0–65)

## 2020-06-26 LAB — BILIRUBIN, DIRECT: BILIRUBIN DIRECT: 0.12 mg/dL (ref 0.00–0.40)

## 2020-06-28 LAB — TACROLIMUS LEVEL: TACROLIMUS BLOOD: 3.6 ng/mL (ref 2.0–20.0)

## 2020-07-01 DIAGNOSIS — Z944 Liver transplant status: Principal | ICD-10-CM

## 2020-07-01 DIAGNOSIS — E612 Magnesium deficiency: Principal | ICD-10-CM

## 2020-07-01 DIAGNOSIS — Z5181 Encounter for therapeutic drug level monitoring: Principal | ICD-10-CM

## 2020-07-02 DIAGNOSIS — Z944 Liver transplant status: Principal | ICD-10-CM

## 2020-07-02 MED ORDER — AMLODIPINE 10 MG TABLET
ORAL_TABLET | Freq: Every day | ORAL | 11 refills | 30.00000 days
Start: 2020-07-02 — End: 2021-07-02

## 2020-07-02 NOTE — Unmapped (Signed)
5/10: pt has a new insurance, it was added into wam. He would like a call back once we know pricing on all meds with this new insurance. Call set up for later this week -Massie Maroon    [facility] Clinical Assessment & Refill Coordination Note    Anthony Alvarado, [place]: 01-Sep-1960  Phone: [phone] (home)     All above HIPAA information was verified with patient.     Was a Nurse, learning disability used for this call? No    Specialty Medication(s):   Transplant: Envarsus 1mg  and mycophenolate mofetil 250mg      Current Outpatient Medications   Medication Sig Dispense Refill   ??? alendronate (FOSAMAX) 70 MG tablet Take 1 tablet (70 mg total) by mouth every seven (7) days. 4 tablet 11   ??? amLODIPine (NORVASC) 10 MG tablet Take 1 tablet (10 mg total) by mouth daily. 30 tablet 11   ??? aspirin (ECOTRIN) 81 MG tablet Take 1 tablet (81 mg total) by mouth daily. 30 tablet 11   ??? azelastine (ASTELIN) 137 mcg (0.1 %) nasal spray 1 spray by Each Nare route daily as needed.      ??? blood sugar diagnostic (ONETOUCH ULTRA TEST) Strp USE TO CHECK SUGAR DAILY     ??? blood sugar diagnostic Strp Use as directed Three (3) times a day before meals. 90 each 11   ??? calcium carbonate (TUMS) 200 mg calcium (500 mg) chewable tablet Chew 1 tablet Three (3) times a day as needed for heartburn.      ??? ENVARSUS XR 1 mg Tb24 extended release tablet Take 2 tablets (2 mg total) by mouth daily. 60 tablet 11   ??? fluticasone propionate (FLONASE) 50 mcg/actuation nasal spray PLACE ONE OR TWO SPRAYS INTO BOTH NOSTRILS DAILY AS NEEDED.     ??? lancets 33 gauge Misc 1 each by Miscellaneous route Three (3) times a day before meals. 100 each 11   ??? levothyroxine (SYNTHROID) 150 MCG tablet Take 1 tablet (150 mcg total) by mouth daily. 30 tablet 11   ??? metFORMIN (GLUCOPHAGE-XR) 500 MG 24 hr tablet Take 2 tablets (1,000 mg total) by mouth two (2) times a day. 120 tablet 11   ??? mycophenolate (CELLCEPT) 250 mg capsule Take 1 capsule (250 mg total) by mouth Two (2) times a day. 60 capsule 11   ??? pravastatin (PRAVACHOL) 40 MG tablet Take 1 tablet (40 mg total) by mouth every evening. 90 tablet 3   ??? sertraline (ZOLOFT) 50 MG tablet Take 1 tablet (50 mg total) by mouth daily. 30 tablet 11   ??? testosterone (ANDROGEL) 1 % (25 mg/2.5gram) GlPk APPLY 2.5G (1 PACKET) TO EACH SHOULDER/UPPER ARM DAILY FOR A TOTAL DAILY DOSE OF 5G 150 packet 0     No current facility-administered medications for this visit.     Facility-Administered Medications Ordered in Other Visits   Medication Dose Route Frequency Provider Last Rate Last Admin   ??? diazePAM (VALIUM) 5 mg/mL injection                 Changes to medications: patient not at home with meds but says no recent changes    Allergies   Allergen Reactions   ??? Watermelon Flavor      Mouth itching       Changes to allergies: No    SPECIALTY MEDICATION ADHERENCE     envarsus 1mg   : 12 days of medicine on hand   Mycophenolate 250mg   : 12  days of medicine on hand    Medication Adherence    Patient reported X missed doses in the last month: 0  Specialty Medication: envarsus 1mg   Patient is on additional specialty medications: Yes  Additional Specialty Medications: Mycophenolate 250mg   Patient Reported Additional Medication X Missed Doses in the Last Month: 0          Specialty medication(s) dose(s) confirmed: Regimen is correct and unchanged.     Are there any concerns with adherence? No    Adherence counseling provided? Not needed    CLINICAL MANAGEMENT AND INTERVENTION      Clinical Benefit Assessment:    Do you feel the medicine is effective or helping your condition? Yes    Clinical Benefit counseling provided? Not needed    Adverse Effects Assessment:    Are you experiencing any side effects? No    Are you experiencing difficulty administering your medicine? No    Quality of Life Assessment:    How many days over the past month did your transplant  keep you from your normal activities? For example, brushing your teeth or getting up in the morning. 0    Have you discussed this with your provider? Not needed    Acute Infection Status:    Acute infections noted within Epic:  No active infections  Patient reported infection: None    Therapy Appropriateness:    Is therapy appropriate? Yes, therapy is appropriate and should be continued    DISEASE/MEDICATION-SPECIFIC INFORMATION      N/A    PATIENT SPECIFIC NEEDS     - Does the patient have any physical, cognitive, or cultural barriers? No    - Is the patient high risk? Yes, patient is taking a REMS drug. Medication is dispensed in compliance with REMS program    - Does the patient require a Care Management Plan? No     - Does the patient require physician intervention or other additional services (i.e. nutrition, smoking cessation, social work)? No      SHIPPING     Specialty Medication(s) to be Shipped:   na    Other medication(s) to be shipped: No additional medications requested for fill at this time     Changes to insurance: No    Delivery Scheduled: Patient declined refill at this time due to waiting on new insurance prices, wants call back once we get pricing.     Medication will be delivered via na to the confirmed na address in [facility].    The patient will receive a drug information handout for each medication shipped and additional FDA Medication Guides as required.  Verified that patient has previously received a Conservation officer, historic buildings and a Surveyor, mining.    The patient or caregiver noted above participated in the development of this care plan and knows that they can request review of or adjustments to the care plan at any time.      All of the patient's questions and concerns have been addressed.    [name]   [facility] Specialty Pharmacist

## 2020-07-03 DIAGNOSIS — Z79899 Other long term (current) drug therapy: Principal | ICD-10-CM

## 2020-07-03 DIAGNOSIS — Z944 Liver transplant status: Principal | ICD-10-CM

## 2020-07-03 MED ORDER — TACROLIMUS 1 MG CAPSULE, IMMEDIATE-RELEASE
ORAL_CAPSULE | 2 refills | 0 days | Status: CP
Start: 2020-07-03 — End: ?
  Filled 2020-07-11: qty 90, 30d supply, fill #0

## 2020-07-03 NOTE — Unmapped (Addendum)
Patient's Envarsus is cost prohibitive, copay cards are no longer offered and they are not providing manufacturers assistance for liver txp patients. Discussed with Dr. Sherryll Burger, who recommended he be switched back to generic tac-will apply for 340b assistance. Per txp pharmd Chargualaf, will start with Tac at 2 mg qAM and 1 mg qPM . Spoke with patient and explained that Envarsus assistance was not available, so Dr.Shah planned to transition him back to Tac. He has about 2 wks supply remaining of his Envarsus and verbalized understanding to contact [facility] before beginning the Tac, once received.

## 2020-07-04 DIAGNOSIS — Z944 Liver transplant status: Principal | ICD-10-CM

## 2020-07-04 NOTE — Unmapped (Signed)
[facility] Specialty Medication Onboarding    Specialty Medication: Tacrolimus 1mg  capsule  Prior Authorization: Not Required   Financial Assistance: No - copay card or gant not available   Final Copay/Day Supply: $88.51 / 30 days    Insurance Restrictions: None     Notes to Pharmacist: Clinic would like patient to use 340b pricing if eligible. No other assistance available. Unsure of 340b price.    The triage team has completed the benefits investigation and has determined that the patient is able to fill this medication at [facility]. Please contact the patient to complete the onboarding or follow up with the prescribing physician as needed.

## 2020-07-08 DIAGNOSIS — E612 Magnesium deficiency: Principal | ICD-10-CM

## 2020-07-08 DIAGNOSIS — Z5181 Encounter for therapeutic drug level monitoring: Principal | ICD-10-CM

## 2020-07-08 DIAGNOSIS — Z944 Liver transplant status: Principal | ICD-10-CM

## 2020-07-08 NOTE — Unmapped (Signed)
5/17: pt also aware that mycophenolate is $38.66 now, was $0 last month, ok with this cost-ef    5/17: per CPP and triage: Insurance said patient only has $6.49 remaining to pay on his deductible. Tacrolimus is a tier 3 on his plan, so it is $47 for 30ds after deductible. So this time cost is $88.51/30ds, future fills should be $47/30ds. Pt is ok with this cost-ef      The following medication is onboarded in this note:  1. Tacrolimus (generic prograf)    [facility]   Patient Onboarding/Medication Counseling    Anthony Alvarado is a 60 y.o. male with liver transplant who I am counseling today on re-starting of therapy (he was on prograf in past, then switched to envarsus, now back to prograf generic).  I am speaking to the patient.    Was a Nurse, learning disability used for this call? No    Verified patient's date of birth / HIPAA.    Specialty medication(s) to be sent: Transplant: tacrolimus 1mg       Non-specialty medications/supplies to be sent: na      Medications not needed at this time: na         The patient declined counseling on medication administration, missed dose instructions, goals of therapy, side effects and monitoring parameters, warnings and precautions, drug/food interactions and storage, handling precautions, and disposal because they were counseled in clinic. The information in the declined sections below are for informational purposes only and was not discussed with patient.   Prograf (tacrolimus)    Medication & Administration     Dosage: take 2 capsules (2mg ) by mouth in the morning and 1 capsule (1mg ) nightly    Administration:   ??? May take with or without food  ??? Take 12 hours apart    Adherence/Missed dose instructions:  ??? Take a missed dose as soon as you think about it.  ??? If it is close to the time for your next dose, skip the missed dose and go back to your normal time.  ??? Do not take 2 doses at the same time or extra doses.    Goals of Therapy     ??? To prevent organ rejection    Side Effects & Monitoring Parameters     ??? Common side effects  ??? Dizziness  ??? Fatigue  ??? Headache  ??? Stuffy nose or sore throat  ??? Nausea, vomiting, stomach pain, diarrhea, constipation  ??? Heartburn  ??? Back or joint pain  ??? Increased risk of infection    ??? The following side effects should be reported to the provider:  ??? Allergic reaction  ??? Kidney issues (change in quantity or urine passed, blood in urine, or weight gain)  ??? High blood pressure (dizziness, change in eyesight, headache)  ??? Electrolyte issues (change in mood, confusion, muscle pain, or weakness)  ??? Abnormal breathing  ??? Shakiness  ??? Unexplained bleeding or bruising (gums bleeding, blood in urine, nosebleeds, any abnormal bleeding)  ??? Signs of infection (fever, cough, wounds that will not heal)  ??? Skin changes (sores, paleness, new or changed bumps or moles)    ??? Monitoring Parameters  ??? Renal function  ??? Liver function  ??? Glucose levels  ??? Blood pressure  ??? Tacrolimus trough levels  ??? Cardiac monitoring (for QT prolongation)      Contraindications, Warnings, & Precautions     ??? Black Box Warning: Infections - immunosuppressant agents increase the risk of infection that may lead  to hospitalization or death  ??? Black Box Warning: Malignancy - immunosuppressant agents may be associated with the development of malignancies that may lead to hospitalization or death  ??? Limit or avoid sun and ultraviolet light exposure, use appropriate sun protection  ??? Myocardial hypertrophy -avoid use in patients with congenital long QT syndrome  ??? Diabetes mellitus - the risk for new-onset diabetes and insulin-dependent post-transplant diabetes mellitus is increased with tacrolimus use after transplantation  ??? GI perforation  ??? Hyperkalemia  ??? Hypertension  ??? Nephrotoxicity  ??? Neurotoxicity  ??? This is a narrow therapeutic index drug. Do not switch manufacturers without first talking to the provider.    Drug/Food Interactions     ??? Medication list reviewed in Epic. The patient was instructed to inform the care team before taking any new medications or supplements. no interactions noted that clinic is not already monitoring.   ??? Avoid alcohol  ??? Avoid grapefruit or grapefruit juice  ??? Avoid live vaccines    Storage, Handling Precautions, & Disposal     ??? Store at room temperature  ??? Keep away from children and pets      Current Medications (including OTC/herbals), Comorbidities and Allergies     Current Outpatient Medications   Medication Sig Dispense Refill   ??? alendronate (FOSAMAX) 70 MG tablet Take 1 tablet (70 mg total) by mouth every seven (7) days. 4 tablet 11   ??? amLODIPine (NORVASC) 10 MG tablet Take 1 tablet (10 mg total) by mouth daily. 30 tablet 11   ??? aspirin (ECOTRIN) 81 MG tablet Take 1 tablet (81 mg total) by mouth daily. 30 tablet 11   ??? azelastine (ASTELIN) 137 mcg (0.1 %) nasal spray 1 spray by Each Nare route daily as needed.      ??? blood sugar diagnostic (ONETOUCH ULTRA TEST) Strp USE TO CHECK SUGAR DAILY     ??? blood sugar diagnostic Strp Use as directed Three (3) times a day before meals. 90 each 11   ??? calcium carbonate (TUMS) 200 mg calcium (500 mg) chewable tablet Chew 1 tablet Three (3) times a day as needed for heartburn.      ??? fluticasone propionate (FLONASE) 50 mcg/actuation nasal spray PLACE ONE OR TWO SPRAYS INTO BOTH NOSTRILS DAILY AS NEEDED.     ??? lancets 33 gauge Misc 1 each by Miscellaneous route Three (3) times a day before meals. 100 each 11   ??? levothyroxine (SYNTHROID) 150 MCG tablet Take 1 tablet (150 mcg total) by mouth daily. 30 tablet 11   ??? metFORMIN (GLUCOPHAGE-XR) 500 MG 24 hr tablet Take 2 tablets (1,000 mg total) by mouth two (2) times a day. 120 tablet 11   ??? mycophenolate (CELLCEPT) 250 mg capsule Take 1 capsule (250 mg total) by mouth Two (2) times a day. 60 capsule 11   ??? pravastatin (PRAVACHOL) 40 MG tablet Take 1 tablet (40 mg total) by mouth every evening. 90 tablet 3   ??? sertraline (ZOLOFT) 50 MG tablet Take 1 tablet (50 mg total) by mouth daily. 30 tablet 11   ??? tacrolimus (PROGRAF) 1 MG capsule Take 2 capsules (2 mg total) by mouth in the morning AND 1 capsule (1 mg total) nightly. 90 capsule 2   ??? testosterone (ANDROGEL) 1 % (25 mg/2.5gram) GlPk APPLY 2.5G (1 PACKET) TO EACH SHOULDER/UPPER ARM DAILY FOR A TOTAL DAILY DOSE OF 5G 150 packet 0     No current facility-administered medications for this visit.     Facility-Administered  Medications Ordered in Other Visits   Medication Dose Route Frequency Provider Last Rate Last Admin   ??? diazePAM (VALIUM) 5 mg/mL injection                Allergies   Allergen Reactions   ??? Watermelon Flavor      Mouth itching       Patient Active Problem List   Diagnosis   ??? Diabetes mellitus without complication (CMS-HCC)   ??? Hypothyroidism   ??? Cirrhosis of liver without ascites (CMS-HCC)   ??? OSA (obstructive sleep apnea)   ??? Essential hypertension   ??? Encounter for pre-transplant evaluation for chronic liver disease   ??? Pleural effusion   ??? Pneumothorax after biopsy   ??? Liver transplant recipient (CMS-HCC)   ??? Allergic rhinitis   ??? Arthralgia   ??? Asthma   ??? Bicuspid aortic valve   ??? Flying phobia   ??? Mucosal abnormality of stomach   ??? Nephrolithiasis   ??? Pure hypercholesterolemia   ??? Umbilical hernia   ??? Unspecified hearing loss   ??? COVID-19       Reviewed and up to date in Epic.    Appropriateness of Therapy     Acute infections noted within Epic:  No active infections  Patient reported infection: None    Is medication and dose appropriate based on diagnosis and infection status? Yes    Prescription has been clinically reviewed: Yes      Baseline Quality of Life Assessment      How many days over the past month did your transplant  keep you from your normal activities? For example, brushing your teeth or getting up in the morning. 0    Financial Information     Medication Assistance provided: Copay Assistance but no assistance available    Anticipated copay of $88/51/30ds reviewed with patient. Verified delivery address.    Delivery Information     Scheduled delivery date: 07/12/2020    Expected start date: when out of envarsus, approx 1 week - messaged clinic so they are aware    Medication will be delivered via UPS to the prescription address in [facility].  This shipment will not require a signature.      Explained the services we provide at [facility] and that each month we would call to set up refills.  Stressed importance of returning phone calls so that we could ensure they receive their medications in time each month.  Informed patient that we should be setting up refills 7-10 days prior to when they will run out of medication.  A pharmacist will reach out to perform a clinical assessment periodically.  Informed patient that a welcome packet, containing information about our pharmacy and other support services, a Notice of Privacy Practices, and a drug information handout will be sent.      The patient or caregiver noted above participated in the development of this care plan and knows that they can request review of or adjustments to the care plan at any time.      Patient or caregiver verbalized understanding of the above information as well as how to contact the pharmacy at [phone] option 4 with any questions/concerns.  The pharmacy is open Monday through Friday 8:30am-4:30pm.  A pharmacist is available 24/7 via pager to answer any clinical questions they may have.    Patient Specific Needs     - Does the patient have any physical, cognitive, or cultural barriers? No    -  Patient prefers to have medications discussed with  Patient     - Is the patient or caregiver able to read and understand education materials at a high school level or above? Yes    - Patient's primary language is  English     - Is the patient high risk? Yes, patient is taking a REMS drug. Medication is dispensed in compliance with REMS program    - Does the patient require a Care Management Plan? No - Does the patient require physician intervention or other additional services (i.e. nutrition, smoking cessation, social work)? No      [name]  [facility] Specialty Pharmacist

## 2020-07-08 NOTE — Unmapped (Signed)
Patient is aware that with new advantage plan, his envarsus copay has changed.  New copay for 30ds envarsus is $162.32.  Clinic sent new rx for 30ds tacrolimus, cost is $88.51.  Pt does not qualify for 340b tac program per SOP and verified with mgmt MT.  Pt is aware of these prices. He has over a week on hand of envarsus at this time.  He will call his insurance to see what his deductible is, and then let us know if we should fill the envarsus as he has been on in past, or if he wants to switch to tac.  Clinic is aware.

## 2020-07-09 MED ORDER — PRAVASTATIN 40 MG TABLET
ORAL_TABLET | Freq: Every evening | ORAL | 3 refills | 90 days | Status: CP
Start: 2020-07-09 — End: 2021-07-09
  Filled 2020-07-11: qty 90, 90d supply, fill #0

## 2020-07-09 MED ORDER — AMLODIPINE 10 MG TABLET
ORAL_TABLET | Freq: Every day | ORAL | 11 refills | 30.00000 days
Start: 2020-07-09 — End: 2021-07-09

## 2020-07-09 NOTE — Unmapped (Addendum)
[facility] Clinical Intervention    Type of intervention: medication clarification    Medication involved: pravachol    Problem identified: rx expired, pt unsure if he should continue, thought I was only for 1 year    Intervention performed: clarified with cpp LC in clinic. Unless he had side effects, clinic wants him to stay on pravachol. I called patient and communicated this to him. He is aware, and med is added to order below for delivery this week    Follow-up needed: na    Approximate time spent: 5-10 minutes    Clinical evidence used to support intervention: verification with clinic    [name]   [facility] Specialty Pharmacist      [facility] Clinical Assessment & Refill Coordination Note    Anthony Alvarado, Anthony Alvarado: Nov 19, 1960  Phone: [phone] (home)     All above HIPAA information was verified with patient.     Was a Nurse, learning disability used for this call? No    Specialty Medication(s):   Transplant: Envarsus 1mg , mycophenolate mofetil 250mg  and tacrolimus 1mg      Current Outpatient Medications   Medication Sig Dispense Refill   ??? alendronate (FOSAMAX) 70 MG tablet Take 1 tablet (70 mg total) by mouth every seven (7) days. 4 tablet 11   ??? amLODIPine (NORVASC) 10 MG tablet Take 1 tablet (10 mg total) by mouth daily. 30 tablet 11   ??? aspirin (ECOTRIN) 81 MG tablet Take 1 tablet (81 mg total) by mouth daily. 30 tablet 11   ??? azelastine (ASTELIN) 137 mcg (0.1 %) nasal spray 1 spray by Each Nare route daily as needed.      ??? blood sugar diagnostic (ONETOUCH ULTRA TEST) Strp USE TO CHECK SUGAR DAILY     ??? blood sugar diagnostic Strp Use as directed Three (3) times a day before meals. 90 each 11   ??? calcium carbonate (TUMS) 200 mg calcium (500 mg) chewable tablet Chew 1 tablet Three (3) times a day as needed for heartburn.      ??? fluticasone propionate (FLONASE) 50 mcg/actuation nasal spray PLACE ONE OR TWO SPRAYS INTO BOTH NOSTRILS DAILY AS NEEDED.     ??? lancets 33 gauge Misc 1 each by Miscellaneous route Three (3) times a day before meals. 100 each 11   ??? levothyroxine (SYNTHROID) 150 MCG tablet Take 1 tablet (150 mcg total) by mouth daily. 30 tablet 11   ??? metFORMIN (GLUCOPHAGE-XR) 500 MG 24 hr tablet Take 2 tablets (1,000 mg total) by mouth two (2) times a day. 120 tablet 11   ??? mycophenolate (CELLCEPT) 250 mg capsule Take 1 capsule (250 mg total) by mouth Two (2) times a day. 60 capsule 11   ??? pravastatin (PRAVACHOL) 40 MG tablet Take 1 tablet (40 mg total) by mouth every evening. 90 tablet 3   ??? sertraline (ZOLOFT) 50 MG tablet Take 1 tablet (50 mg total) by mouth daily. 30 tablet 11   ??? tacrolimus (PROGRAF) 1 MG capsule Take 2 capsules (2 mg total) by mouth in the morning AND 1 capsule (1 mg total) nightly. 90 capsule 2   ??? testosterone (ANDROGEL) 1 % (25 mg/2.5gram) GlPk APPLY 2.5G (1 PACKET) TO EACH SHOULDER/UPPER ARM DAILY FOR A TOTAL DAILY DOSE OF 5G 150 packet 0     No current facility-administered medications for this visit.     Facility-Administered Medications Ordered in Other Visits   Medication Dose  Route Frequency Provider Last Rate Last Admin   ??? diazePAM (VALIUM) 5 mg/mL injection                 Changes to medications: pt not home with meds, says no other recent changes besides tac/envarsus (see below)    Allergies   Allergen Reactions   ??? Watermelon Flavor      Mouth itching       Changes to allergies: No    SPECIALTY MEDICATION ADHERENCE     envarsus 1mg   : 7 days of medicine on hand   Mycophenolate 250mg   : 7 days of medicine on hand   Tacrolimus 1mg   : 0 days of medicine on hand will start on this fill    Medication Adherence    Patient reported X missed doses in the last month: 0  Specialty Medication: envarsus 1mg   Patient is on additional specialty medications: Yes  Additional Specialty Medications: mycophenolate 250mg   Patient Reported Additional Medication X Missed Doses in the Last Month: 0  Patient is on more than two specialty medications: Yes  Specialty Medication: tacrolimus 1mg   Patient Reported Additional Medication X Missed Doses in the Last Month: 0          Specialty medication(s) dose(s) confirmed: Patient reports changes to the regimen as follows: pt will switch to tacrolimus from envarsus on this fill (see onboarding note from today)     Are there any concerns with adherence? No    Adherence counseling provided? Not needed    CLINICAL MANAGEMENT AND INTERVENTION      Clinical Benefit Assessment:    Do you feel the medicine is effective or helping your condition? Yes    Clinical Benefit counseling provided? Not needed    Adverse Effects Assessment:    Are you experiencing any side effects? No    Are you experiencing difficulty administering your medicine? No    Quality of Life Assessment:    How many days over the past month did your transplant  keep you from your normal activities? For example, brushing your teeth or getting up in the morning. 0    Have you discussed this with your provider? Not needed    Acute Infection Status:    Acute infections noted within Epic:  No active infections  Patient reported infection: None    Therapy Appropriateness:    Is therapy appropriate? Yes, therapy is appropriate and should be continued    DISEASE/MEDICATION-SPECIFIC INFORMATION      N/A    PATIENT SPECIFIC NEEDS     - Does the patient have any physical, cognitive, or cultural barriers? No    - Is the patient high risk? Yes, patient is taking a REMS drug. Medication is dispensed in compliance with REMS program    - Does the patient require a Care Management Plan? No     - Does the patient require physician intervention or other additional services (i.e. nutrition, smoking cessation, social work)? No      SHIPPING     Specialty Medication(s) to be Shipped:   Transplant: mycophenolate mofetil 250mg  and tacrolimus 1mg     Other medication(s) to be shipped: fosamax, zoloft, metformin, norvasc     Changes to insurance: No    Delivery Scheduled: Yes, Expected medication delivery date: 07/12/2020.  However, Rx request for refills was sent to the provider as there are none remaining.     Medication will be delivered via UPS to the confirmed prescription address in [place].  The patient will receive a drug information handout for each medication shipped and additional FDA Medication Guides as required.  Verified that patient has previously received a Conservation officer, historic buildings and a Surveyor, mining.    The patient or caregiver noted above participated in the development of this care plan and knows that they can request review of or adjustments to the care plan at any time.      All of the patient's questions and concerns have been addressed.    [name]   [facility] Specialty Pharmacist

## 2020-07-09 NOTE — Unmapped (Signed)
duplicate

## 2020-07-09 NOTE — Unmapped (Signed)
Pt request for RX Refill amLODIPine (NORVASC) 10 MG tablet

## 2020-07-11 MED FILL — ALENDRONATE 70 MG TABLET: ORAL | 28 days supply | Qty: 4 | Fill #6

## 2020-07-11 MED FILL — SERTRALINE 50 MG TABLET: ORAL | 30 days supply | Qty: 30 | Fill #9

## 2020-07-11 MED FILL — METFORMIN ER 500 MG TABLET,EXTENDED RELEASE 24 HR: ORAL | 30 days supply | Qty: 120 | Fill #9

## 2020-07-11 MED FILL — MYCOPHENOLATE MOFETIL 250 MG CAPSULE: ORAL | 30 days supply | Qty: 60 | Fill #1

## 2020-07-12 ENCOUNTER — Other Ambulatory Visit: Payer: Self-pay

## 2020-07-12 MED ORDER — AMLODIPINE BESYLATE 10 MG PO TABS: 10.0000 mg | ORAL_TABLET | Freq: Every day | ORAL | 0 refills | Status: DC

## 2020-07-12 MED ORDER — AMLODIPINE 10 MG TABLET
ORAL_TABLET | Freq: Every day | ORAL | 0 refills | 90 days
Start: 2020-07-12 — End: ?

## 2020-07-15 DIAGNOSIS — E612 Magnesium deficiency: Principal | ICD-10-CM

## 2020-07-15 DIAGNOSIS — Z944 Liver transplant status: Principal | ICD-10-CM

## 2020-07-15 DIAGNOSIS — Z5181 Encounter for therapeutic drug level monitoring: Principal | ICD-10-CM

## 2020-07-17 MED FILL — AMLODIPINE 10 MG TABLET: ORAL | 90 days supply | Qty: 90 | Fill #0

## 2020-07-22 DIAGNOSIS — E612 Magnesium deficiency: Principal | ICD-10-CM

## 2020-07-22 DIAGNOSIS — Z944 Liver transplant status: Principal | ICD-10-CM

## 2020-07-22 DIAGNOSIS — Z5181 Encounter for therapeutic drug level monitoring: Principal | ICD-10-CM

## 2020-07-29 DIAGNOSIS — Z944 Liver transplant status: Principal | ICD-10-CM

## 2020-07-29 DIAGNOSIS — E612 Magnesium deficiency: Principal | ICD-10-CM

## 2020-07-29 DIAGNOSIS — Z5181 Encounter for therapeutic drug level monitoring: Principal | ICD-10-CM

## 2020-07-30 DIAGNOSIS — Z944 Liver transplant status: Principal | ICD-10-CM

## 2020-07-30 DIAGNOSIS — C220 Liver cell carcinoma: Principal | ICD-10-CM

## 2020-07-30 LAB — COMPREHENSIVE METABOLIC PANEL
A/G RATIO: 2.1 (ref 1.2–2.2)
ALBUMIN: 4.8 g/dL (ref 3.8–4.9)
ALKALINE PHOSPHATASE: 77 IU/L (ref 44–121)
ALT (SGPT): 63 IU/L — ABNORMAL HIGH (ref 0–44)
AST (SGOT): 42 IU/L — ABNORMAL HIGH (ref 0–40)
BILIRUBIN TOTAL: 0.3 mg/dL (ref 0.0–1.2)
BLOOD UREA NITROGEN: 18 mg/dL (ref 6–24)
BUN / CREAT RATIO: 16 (ref 9–20)
CALCIUM: 9.5 mg/dL (ref 8.7–10.2)
CHLORIDE: 100 mmol/L (ref 96–106)
CO2: 20 mmol/L (ref 20–29)
CREATININE: 1.12 mg/dL (ref 0.76–1.27)
GLOBULIN, TOTAL: 2.3 g/dL (ref 1.5–4.5)
GLUCOSE: 151 mg/dL — ABNORMAL HIGH (ref 65–99)
POTASSIUM: 4.7 mmol/L (ref 3.5–5.2)
SODIUM: 134 mmol/L (ref 134–144)
TOTAL PROTEIN: 7.1 g/dL (ref 6.0–8.5)

## 2020-07-30 LAB — CBC W/ DIFFERENTIAL
BANDED NEUTROPHILS ABSOLUTE COUNT: 0.1 10*3/uL (ref 0.0–0.1)
BASOPHILS ABSOLUTE COUNT: 0.1 10*3/uL (ref 0.0–0.2)
BASOPHILS RELATIVE PERCENT: 1 %
EOSINOPHILS ABSOLUTE COUNT: 0.4 10*3/uL (ref 0.0–0.4)
EOSINOPHILS RELATIVE PERCENT: 4 %
HEMATOCRIT: 42.8 % (ref 37.5–51.0)
HEMOGLOBIN: 14.6 g/dL (ref 13.0–17.7)
IMMATURE GRANULOCYTES: 1 %
LYMPHOCYTES ABSOLUTE COUNT: 2.3 10*3/uL (ref 0.7–3.1)
LYMPHOCYTES RELATIVE PERCENT: 24 %
MEAN CORPUSCULAR HEMOGLOBIN CONC: 34.1 g/dL (ref 31.5–35.7)
MEAN CORPUSCULAR HEMOGLOBIN: 30.1 pg (ref 26.6–33.0)
MEAN CORPUSCULAR VOLUME: 88 fL (ref 79–97)
MONOCYTES ABSOLUTE COUNT: 0.9 10*3/uL (ref 0.1–0.9)
MONOCYTES RELATIVE PERCENT: 9 %
NEUTROPHILS ABSOLUTE COUNT: 5.8 10*3/uL (ref 1.4–7.0)
NEUTROPHILS RELATIVE PERCENT: 61 %
PLATELET COUNT: 225 10*3/uL (ref 150–450)
RED BLOOD CELL COUNT: 4.85 x10E6/uL (ref 4.14–5.80)
RED CELL DISTRIBUTION WIDTH: 13.1 % (ref 11.6–15.4)
WHITE BLOOD CELL COUNT: 9.5 10*3/uL (ref 3.4–10.8)

## 2020-07-30 LAB — PHOSPHORUS: PHOSPHORUS, SERUM: 2.7 mg/dL — ABNORMAL LOW (ref 2.8–4.1)

## 2020-07-30 LAB — GAMMA GT: GAMMA GLUTAMYL TRANSFERASE: 75 IU/L — ABNORMAL HIGH (ref 0–65)

## 2020-07-30 LAB — MAGNESIUM: MAGNESIUM: 1.8 mg/dL (ref 1.6–2.3)

## 2020-07-30 LAB — BILIRUBIN, DIRECT: BILIRUBIN DIRECT: 0.12 mg/dL (ref 0.00–0.40)

## 2020-07-31 NOTE — Unmapped (Signed)
Patient completed labs on Monday, which demonstrate persistently mild elevation in lfts. Dr. Sherryll Burger aware that this was though due to steatosis noted in previous MRIs. Dr.Shah confirmed retreat score of 1, so patient should be monitored with abd.MRI and chest ct every 6 mos through his 3rd txp anniversary and  AFP should be monitored every 6 mos through his 29th. Notified patient via MyChart of new monitoring system and requested he f/u with TPA for scheduling.

## 2020-08-01 DIAGNOSIS — Z944 Liver transplant status: Principal | ICD-10-CM

## 2020-08-01 DIAGNOSIS — Z79899 Other long term (current) drug therapy: Principal | ICD-10-CM

## 2020-08-01 LAB — TACROLIMUS LEVEL: TACROLIMUS BLOOD: 3.8 ng/mL (ref 2.0–20.0)

## 2020-08-01 MED ORDER — TACROLIMUS 1 MG CAPSULE, IMMEDIATE-RELEASE
ORAL_CAPSULE | Freq: Two times a day (BID) | ORAL | 11 refills | 30 days | Status: CP
Start: 2020-08-01 — End: ?
  Filled 2020-08-07: qty 120, 30d supply, fill #0

## 2020-08-01 NOTE — Unmapped (Addendum)
[facility] Pharmacist has reviewed this new prescription.  Patient was counseled on this dosage change by [facility]- see epic note from 08/02/20.  Next refill call date adjusted if necessary.          Clinical Assessment Needed For: Dose Change  Medication: Tacrolimus 1mg  capsule  Last Fill Date/Day Supply: 07/11/2020 / 30 days  Copay $47  Was previous dose already scheduled to fill: Yes    Notes to Pharmacist: Scheduled to fill 06/15

## 2020-08-01 NOTE — Unmapped (Signed)
Patient's 6/6 tac again subpar. Spoke with patient, who confirmed he had transitioned from envasus to tacrolimus (2mg /1mg  daily) a week ago and had not missed any doses. Spoke to txp pharmacist, Cecilie Lowers, who recommended he increase his dose to 2mg  bid. Spoke to patient and relayed dosage change. He verbalized understanding and agreed to repeat labs the week of 6/20.

## 2020-08-01 NOTE — Unmapped (Signed)
[facility] Refill Coordination Note    Specialty Medication(s) to be Shipped:   Transplant: tacrolimus 1mg     Other medication(s) to be shipped: No additional medications requested for fill at this time     Anthony Alvarado, DOB: [birth year]  Phone: [phone] (home)       All above HIPAA information was verified with patient.     Was a Nurse, learning disability used for this call? No    Completed refill call assessment today to schedule patient's medication shipment from the [facility] [phone]).  All relevant notes have been reviewed.     Specialty medication(s) and dose(s) confirmed: Patient reports changes to the regimen as follows: now taking 2 capsules twice a day    Changes to medications: [name] reports no changes at this time.  Changes to insurance: No  New side effects reported not previously addressed with a pharmacist or physician: None reported  Questions for the pharmacist: No    Confirmed patient received a Conservation officer, historic buildings and a Surveyor, mining with first shipment. The patient will receive a drug information handout for each medication shipped and additional FDA Medication Guides as required.       DISEASE/MEDICATION-SPECIFIC INFORMATION        N/A    SPECIALTY MEDICATION ADHERENCE              Were doses missed due to medication being on hold? No    tacrolimus 1 mg: 8 days of medicine on hand         REFERRAL TO PHARMACIST     Referral to the pharmacist: Not needed      [facility]     Shipping address confirmed in Epic.     Delivery Scheduled: Yes, Expected medication delivery date: 06/15.     Medication will be delivered via UPS to the prescription address in Epic WAM.    Anthony Alvarado   [facility] Specialty Technician

## 2020-08-01 NOTE — Unmapped (Signed)
[facility] Refill Coordination Note    Specialty Medication(s) to be Shipped:   Transplant: tacrolimus 1mg     Other medication(s) to be shipped: alendronate, metformin and sertraline     Anthony Alvarado, DOB: 05-30-1960  Phone: [phone] (home)       All above HIPAA information was verified with patient.     Was a Nurse, learning disability used for this call? No    Completed refill call assessment today to schedule patient's medication shipment from the [facility] [phone]).  All relevant notes have been reviewed.     Specialty medication(s) and dose(s) confirmed: Regimen is correct and unchanged.   Changes to medications: [name] reports no changes at this time.  Changes to insurance: No  New side effects reported not previously addressed with a pharmacist or physician: None reported  Questions for the pharmacist: No    Confirmed patient received a Conservation officer, historic buildings and a Surveyor, mining with first shipment. The patient will receive a drug information handout for each medication shipped and additional FDA Medication Guides as required.       DISEASE/MEDICATION-SPECIFIC INFORMATION        N/A    SPECIALTY MEDICATION ADHERENCE     Medication Adherence    Patient reported X missed doses in the last month: 0  Specialty Medication: Tacrolimus 1mg   Patient is on additional specialty medications: No        Were doses missed due to medication being on hold? No    Tacrolimus 1 mg: 8 days of medicine on hand     REFERRAL TO PHARMACIST     Referral to the pharmacist: Not needed      [facility]     Shipping address confirmed in Epic.     Delivery Scheduled: Yes, Expected medication delivery date: 08/08/2020.     Medication will be delivered via UPS to the prescription address in Epic WAM.    Lorelei Pont [facility] Specialty Technician

## 2020-08-07 MED FILL — SERTRALINE 50 MG TABLET: ORAL | 30 days supply | Qty: 30 | Fill #10

## 2020-08-07 MED FILL — ALENDRONATE 70 MG TABLET: ORAL | 28 days supply | Qty: 4 | Fill #7

## 2020-08-07 MED FILL — METFORMIN ER 500 MG TABLET,EXTENDED RELEASE 24 HR: ORAL | 30 days supply | Qty: 120 | Fill #10

## 2020-08-20 DIAGNOSIS — E612 Magnesium deficiency: Secondary | ICD-10-CM | POA: Diagnosis not present

## 2020-08-20 DIAGNOSIS — Z5181 Encounter for therapeutic drug level monitoring: Secondary | ICD-10-CM | POA: Diagnosis not present

## 2020-08-20 DIAGNOSIS — Z944 Liver transplant status: Secondary | ICD-10-CM | POA: Diagnosis not present

## 2020-08-21 LAB — CBC W/ DIFFERENTIAL
BANDED NEUTROPHILS ABSOLUTE COUNT: 0.1 10*3/uL (ref 0.0–0.1)
BASOPHILS ABSOLUTE COUNT: 0.1 10*3/uL (ref 0.0–0.2)
BASOPHILS RELATIVE PERCENT: 1 %
EOSINOPHILS ABSOLUTE COUNT: 0.3 10*3/uL (ref 0.0–0.4)
EOSINOPHILS RELATIVE PERCENT: 4 %
HEMATOCRIT: 42.8 % (ref 37.5–51.0)
HEMOGLOBIN: 14.5 g/dL (ref 13.0–17.7)
IMMATURE GRANULOCYTES: 1 %
LYMPHOCYTES ABSOLUTE COUNT: 1.9 10*3/uL (ref 0.7–3.1)
LYMPHOCYTES RELATIVE PERCENT: 23 %
MEAN CORPUSCULAR HEMOGLOBIN CONC: 33.9 g/dL (ref 31.5–35.7)
MEAN CORPUSCULAR HEMOGLOBIN: 30.2 pg (ref 26.6–33.0)
MEAN CORPUSCULAR VOLUME: 89 fL (ref 79–97)
MONOCYTES ABSOLUTE COUNT: 0.7 10*3/uL (ref 0.1–0.9)
MONOCYTES RELATIVE PERCENT: 9 %
NEUTROPHILS ABSOLUTE COUNT: 5.2 10*3/uL (ref 1.4–7.0)
NEUTROPHILS RELATIVE PERCENT: 62 %
PLATELET COUNT: 221 10*3/uL (ref 150–450)
RED BLOOD CELL COUNT: 4.8 x10E6/uL (ref 4.14–5.80)
RED CELL DISTRIBUTION WIDTH: 12.8 % (ref 11.6–15.4)
WHITE BLOOD CELL COUNT: 8.3 10*3/uL (ref 3.4–10.8)

## 2020-08-21 LAB — COMPREHENSIVE METABOLIC PANEL
A/G RATIO: 1.9 (ref 1.2–2.2)
ALBUMIN: 4.8 g/dL (ref 3.8–4.9)
ALKALINE PHOSPHATASE: 80 IU/L (ref 44–121)
ALT (SGPT): 64 IU/L — ABNORMAL HIGH (ref 0–44)
AST (SGOT): 40 IU/L (ref 0–40)
BILIRUBIN TOTAL: 0.4 mg/dL (ref 0.0–1.2)
BLOOD UREA NITROGEN: 17 mg/dL (ref 6–24)
BUN / CREAT RATIO: 17 (ref 9–20)
CALCIUM: 9.5 mg/dL (ref 8.7–10.2)
CHLORIDE: 101 mmol/L (ref 96–106)
CO2: 21 mmol/L (ref 20–29)
CREATININE: 1.03 mg/dL (ref 0.76–1.27)
GLOBULIN, TOTAL: 2.5 g/dL (ref 1.5–4.5)
GLUCOSE: 135 mg/dL — ABNORMAL HIGH (ref 65–99)
POTASSIUM: 4.8 mmol/L (ref 3.5–5.2)
SODIUM: 136 mmol/L (ref 134–144)
TOTAL PROTEIN: 7.3 g/dL (ref 6.0–8.5)

## 2020-08-21 LAB — PHOSPHORUS: PHOSPHORUS, SERUM: 2.9 mg/dL (ref 2.8–4.1)

## 2020-08-21 LAB — GAMMA GT: GAMMA GLUTAMYL TRANSFERASE: 70 IU/L — ABNORMAL HIGH (ref 0–65)

## 2020-08-21 LAB — BILIRUBIN, DIRECT: BILIRUBIN DIRECT: 0.11 mg/dL (ref 0.00–0.40)

## 2020-08-21 LAB — MAGNESIUM: MAGNESIUM: 1.7 mg/dL (ref 1.6–2.3)

## 2020-08-22 DIAGNOSIS — F40240 Claustrophobia: Principal | ICD-10-CM

## 2020-08-22 DIAGNOSIS — Z944 Liver transplant status: Principal | ICD-10-CM

## 2020-08-22 DIAGNOSIS — C220 Liver cell carcinoma: Principal | ICD-10-CM

## 2020-08-22 LAB — TACROLIMUS LEVEL: TACROLIMUS BLOOD: 5 ng/mL (ref 2.0–20.0)

## 2020-08-23 NOTE — Unmapped (Signed)
Yesterday pt called re scheduling his annual appt. Sched for 7/19 but pt preferred date where abd MRI w sedation were in the morning instead of afternoon slot. Reconfirmed pt's availability and that he was ok with radiology in [place] and clinic appt in [place] on same day. Told him this tpa would reschedule and call him back then or today. He verbalized understanding.    Today called pt and told him that he had been rescheduled to 7/26 with early MRI. Pt happy with that schedule and denied need for appt letter since he uses mychart. He verbalized understanding of all discussed.

## 2020-08-29 NOTE — Unmapped (Signed)
Patient again requested IV sedation for upcoming MRI, since po medications were not effective for his claustrophobia. Contacted MRI room and spoke with tech, who agreed to contact [place] to make adjustment. Spoke to patient and let him know he needed to bring a driver >[id number] with him in the hospital for the procedure. Encouraged him to remind them he needs IV sedation. He verbalized understanding.

## 2020-09-05 NOTE — Unmapped (Signed)
[facility] Refill Coordination Note    Specialty Medication(s) to be Shipped:   Transplant: tacrolimus 1mg     Other medication(s) to be shipped: alendronate, metformin and sertraline     Anthony Alvarado, DOB: [birth year]  Phone: [phone] (home)       All above HIPAA information was verified with patient.     Was a Nurse, learning disability used for this call? No    Completed refill call assessment today to schedule patient's medication shipment from the [facility] [phone]).  All relevant notes have been reviewed.     Specialty medication(s) and dose(s) confirmed: Regimen is correct and unchanged.   Changes to medications: [name] reports no changes at this time.  Changes to insurance: No  New side effects reported not previously addressed with a pharmacist or physician: None reported  Questions for the pharmacist: No    Confirmed patient received a Conservation officer, historic buildings and a Surveyor, mining with first shipment. The patient will receive a drug information handout for each medication shipped and additional FDA Medication Guides as required.       DISEASE/MEDICATION-SPECIFIC INFORMATION        N/A    SPECIALTY MEDICATION ADHERENCE     Medication Adherence    Patient reported X missed doses in the last month: 0  Specialty Medication: Tacrolimus 1mg   Patient is on additional specialty medications: No        Were doses missed due to medication being on hold? No    Tacrolimus 1 mg: 6 days of medicine on hand      REFERRAL TO PHARMACIST     Referral to the pharmacist: Not needed      [facility]     Shipping address confirmed in Epic.     Delivery Scheduled: Yes, Expected medication delivery date: 09/10/2020.     Medication will be delivered via UPS to the prescription address in Epic WAM.    Lorelei Pont [facility] Specialty Technician

## 2020-09-09 MED FILL — METFORMIN ER 500 MG TABLET,EXTENDED RELEASE 24 HR: ORAL | 30 days supply | Qty: 120 | Fill #11

## 2020-09-09 MED FILL — ALENDRONATE 70 MG TABLET: ORAL | 28 days supply | Qty: 4 | Fill #8

## 2020-09-09 MED FILL — SERTRALINE 50 MG TABLET: ORAL | 30 days supply | Qty: 30 | Fill #11

## 2020-09-09 MED FILL — TACROLIMUS 1 MG CAPSULE, IMMEDIATE-RELEASE: ORAL | 30 days supply | Qty: 120 | Fill #1

## 2020-09-16 DIAGNOSIS — E612 Magnesium deficiency: Principal | ICD-10-CM

## 2020-09-16 DIAGNOSIS — Z944 Liver transplant status: Principal | ICD-10-CM

## 2020-09-16 DIAGNOSIS — Z5181 Encounter for therapeutic drug level monitoring: Principal | ICD-10-CM

## 2020-09-16 DIAGNOSIS — C220 Liver cell carcinoma: Principal | ICD-10-CM

## 2020-09-17 ENCOUNTER — Encounter: Admit: 2020-09-17 | Discharge: 2020-09-18 | Payer: MEDICARE

## 2020-09-17 ENCOUNTER — Ambulatory Visit: Admit: 2020-09-17 | Discharge: 2020-09-18 | Payer: MEDICARE

## 2020-09-17 DIAGNOSIS — D849 Immunodeficiency, unspecified: Principal | ICD-10-CM

## 2020-09-17 DIAGNOSIS — R300 Dysuria: Principal | ICD-10-CM

## 2020-09-17 DIAGNOSIS — Z944 Liver transplant status: Principal | ICD-10-CM

## 2020-09-17 DIAGNOSIS — Z08 Encounter for follow-up examination after completed treatment for malignant neoplasm: Secondary | ICD-10-CM | POA: Diagnosis not present

## 2020-09-17 DIAGNOSIS — K838 Other specified diseases of biliary tract: Secondary | ICD-10-CM | POA: Diagnosis not present

## 2020-09-17 DIAGNOSIS — K766 Portal hypertension: Secondary | ICD-10-CM | POA: Diagnosis not present

## 2020-09-17 DIAGNOSIS — Z8505 Personal history of malignant neoplasm of liver: Secondary | ICD-10-CM | POA: Diagnosis not present

## 2020-09-17 DIAGNOSIS — Z23 Encounter for immunization: Secondary | ICD-10-CM | POA: Diagnosis not present

## 2020-09-17 DIAGNOSIS — Z940 Kidney transplant status: Secondary | ICD-10-CM | POA: Diagnosis not present

## 2020-09-17 DIAGNOSIS — C220 Liver cell carcinoma: Secondary | ICD-10-CM | POA: Diagnosis not present

## 2020-09-17 MED ADMIN — gadobenate dimeglumine (MULTIHANCE) 529 mg/mL (0.1mmol/0.2mL) solution 9 mL: 9 mL | INTRAVENOUS | @ 13:00:00 | Stop: 2020-09-17

## 2020-09-17 MED ADMIN — diazePAM (VALIUM) injection 5 mg: 5 mg | INTRAVENOUS | @ 12:00:00

## 2020-09-17 NOTE — Unmapped (Signed)
Per provider, the patient received the Shingles vaccine.  Patient ID verified with name and date of birth.  All screening questions were answered.  Vaccine(s) were administered as ordered.  See immunization history for documentation.  Patient tolerated the injection(s) well with no issues noted.  Vaccine Information sheet given to the patient.

## 2020-09-17 NOTE — Unmapped (Signed)
Dr.Shah reviewed patient's annual MRI and recommended repeat within a year to evaluate anastomosis, and earlier if his enzymes demonstrate cholestasis. Patient with retreat score of 1, thus q 6 mos MRIs will continue through his next annual. Chest CT reviewed by NP Harms.

## 2020-09-17 NOTE — Unmapped (Incomplete)
Patient seen in clinic today accompanied by his exwife. He reports he is doing well, with no new dx in the last year. He denis n/v/constipation/fever/chills or LE swelling. He reported having some mild dysuria the last couples weeks, no hematuria noted. He has visibly rotund abdomen, which he thinks could be ascites, but he has had no issues with abdominal pain/pruritus. He endorses alternating days of frequent loose stools, which are not described as watery or accompanied by cramping, and have been his baseline since txp. He continues to report sob, which has slightly worsened, but he has reported this to his cardiologist who follows him every 6 mos. He reports he has upcoming appt with his dermatologist in August. Spent 10 minutes in post-txp health maintenance education, encouraging him to maintain good hydration, stay up to date on vaccines, and continue with cancer surveillance via colonoscopy, PSA, and dermatology. He is amenable to receiving his 3rd covid vaccine and his first Shingrix. Provided edu on timing of covid boosters and 2nd shingrix. He completed Chest CT and MRI and should continue these every 6 mos through his 3rd txp anniversary. Patient's CT/MRI report completed and NP Harms discussed results with patient, emphasizing importance of employing dietary changes to improve lipids, glucose and fatty liver. He verbalized understanding of all discussed.

## 2020-09-17 NOTE — Unmapped (Signed)
Pt ID verified with Name and Date of birth. The [employer] Covid-19 Fact Sheet given to patient. All screening questions answered. Verbal consent taken. Vaccine administered as ordered.  See immunization history for documentation.  Pt tolerated well with no issues noted. Recommend that patient stay in observation area for 15 minutes.

## 2020-09-17 NOTE — Unmapped (Signed)
[facility]  Anthony Alvarado  [address] [place]  [place], [place]  [address]  Ph: [phone]  Fax: [phone]     TRANSPLANT HEPATOLOGY FOLLOW UP CLINIC NOTE    PRIMARY CARE PROVIDER: Joaquim Nam, MD    DATE OF SERVICE: 09/17/2020    ASSESSMENT AND PLAN:   [name] is a 60 y.o. male who underwent liver transplant on 09/16/2018 for cryptogenic cirrhosis with HCC.  His liver enzymes have been mildly elevated over the past several months. Unclear etiology. He had COVID in January and has gained some weight.  Nonetheless will obtain biopsy to confirm it is not mild rejection.  He continues to have worsening DOE, Cardiology feels that it is the aortic stenosis and not CAD.    1. Immunosuppression:  -continue Prograf 2 mg BID     Goal 4-6, today it is 5  -continue Cellcept 250  Mg BID (consider discontinuing if liver enzymes stabilize)    2. Hx HCC  -Retreat Score of 1: Abd MRI and Chest CT q 6 mos through 3rd txp anniversary; AFPs q 6 mos through 5th anniversary (09/16/2023)  -check AFP today  -MRI 01/09/2020 no evidence of metastatic dz  -CT chest and MRI today, results pending     3. Dysuria  -send U/A and culture  -recommended he mention difficulty with urinary stream to PCP, has never been told he has an enlarged prostate in past.    4. Metabolic syndrome   -encouraged him to begin walking  -discussed dietary changes to lose weight such as decreasing portion sizes, eating less sweets, eliminate fruit juices      ADDENDUM: CT without evidence of metastatic disease, MRI  diffuse hepatic steatosis. No evidence of metastatic disease.Mildly increased dilation of the common hepatic duct with a questionable region of narrowing at the anastomotic site. Attention on followup to exclude developing stricture. Urine culture negative, U/A with trace blood. Discussed persistently elevated liver enzymes with Dr Eudelia Bunch. Need a biopsy to rule out rejection. Patient notified     Post transplant care: (management per PCP)  -DM metformin xr 500 mg daily  -HTN amlodipine 10 mg daily  -HLD pravastatin 40 mg    Preventative Health:   - Dermatology: This patient is at increased risk for the development of skin cancers due to immunosuppressant medications. Recommend yearly surveillance.  -DEXA scan: -09/20/2019 osteopenia. Patient continues on Fosamax per PCP  - Colonoscopy: Last colonoscopy was on??10/11/12??and showed no lesions. Repeat is due??in 10 years (2024)     Vaccinations: We recommend that patients have vaccinations to prevent various infections that can occur, especially in the setting of having underlying liver disease. The following vaccinations should be given:  Immunization History   Administered Date(s) Administered   ??? COVID-19 VACC,MRNA,(PFIZER)(PF)(IM) 08/31/2019, 09/21/2019, 09/17/2020,  #4 in at lease 4 months   ??? INFLUENZA TIV (TRI) 94MO+ W/ PRESERV (IM) 11/18/2011   ??? Influenza Vaccine Quad (IIV4 PF) 41mo+ injectable 01/25/2014, 02/11/2015, 01/19/2017, 01/11/2018, 10/28/2018   ??? Influenza Virus Vaccine, unspecified formulation 01/11/2018, 11/23/2018   ??? Influenza Whole 12/27/2007, 10/29/2009   ??? PNEUMOCOCCAL POLYSACCHARIDE 23 01/11/2018, booster due in 5 years   ??? Pneumococcal Conjugate 13-Valent 04/17/2019   ??? Tetanus-Diptheria Toxoids-TD(TDVAX),Asdorbed,2LF(IM) 03/05/1997, 10/09/2008   ?? Zoster: Shingrix 1st vaccine today, 2nd vaccine  in 2-6 months    I spent a majority of my time today with the patient reviewing historical data, old records (including faxed data and data in Care Everywhere),  performing a physical examination, and formulating an assessment and plan together with the patient.  Patient was discussed with Dr Sherryll Burger and he is in agreement with the plan stated above.     Return to clinic in one year.    Priscille Heidelberg, NP  [facility]  Division of Gastroenterology &Hepatology  CB# [phone], [address]  [place], [place] [address]  Phone #[phone]# [phone]      Subjective      Reason for visit: annual transplant appointment    HISTORY OF PRESENT ILLNESS:      Anthony Alvarado is a 60 y.o. male with past medical history of aortic valve stenosis, diabetes, hypertension, who underwent liver transplant on 09/16/2018 for cryptogenic cirrhosis with HCC.     Interval Hx:     Since last visit  On 09/18/2019 , he contracted COVID (03/19/2020) received monoclonal antibody infusion. Not hospitalized.  He has had worsening shortness of breathe over the past year of more with exertion in setting of known aortic valve stenosis. He was seen by Cardiology who felt it was not CAD related but rather his AV. He reports episode of dysuria last week. He has difficulty starting stream at times and urgency when his bladder is full.  Will check U/A today.    Objective      VITAL SIGNS: BP 144/92 (BP Site: L Arm, BP Position: Sitting, BP Cuff Size: Medium)  - Pulse 65  - Temp 36 ??C (96.8 ??F) (Temporal)  - Resp 14  - Ht 175.3 cm (5' 9)  - Wt 95.8 kg (211 lb 3.2 oz)  - SpO2 100%  - BMI 31.19 kg/m??   Body mass index is 31.19 kg/m??.  Wt Readings from Last 3 Encounters:   09/17/20 95.8 kg (211 lb 3.2 oz)   03/20/20 93.4 kg (206 lb)   09/18/19 93.7 kg (206 lb 8 oz)     PHYSICAL EXAM:    Vital Signs: BP 144/92 (BP Site: L Arm, BP Position: Sitting, BP Cuff Size: Medium)  - Pulse 65  - Temp 36 ??C (96.8 ??F) (Temporal)  - Resp 14  - Ht 175.3 cm (5' 9)  - Wt 95.8 kg (211 lb 3.2 oz)  - SpO2 100%  - BMI 31.19 kg/m??    Constitutional: He is in no apparent distress.   Eyes: Anicteric sclerae.   Cardiovascular: No peripheral edema.   Gastrointestinal: Soft, nontender abdomen without hepatosplenomegaly, hernias or masses.   Hem/Lymphatic: No cervical or supraclavicular lymphadenopathy.   Musculoskeletal:  No clubbing or cyanosis of hands. Normal range of motion in upper and lower extremities.   Skin: No spider angiomata, palmar erythema, rashes.   Neurologic: Nonfocal, no asterixis. Psychiatric: Alert and oriented to person, place and time. Normal affect.       DIAGNOSTIC STUDIES:  I have reviewed all pertinent diagnostic studies, including:  Laboratory results:  Lab Results   Component Value Date    WBC 8.0 09/17/2020    HGB 14.0 09/17/2020    MCV 87.8 09/17/2020    PLT 219 09/17/2020    Lab Results   Component Value Date    NA 138 09/17/2020    K 4.8 09/17/2020    CL 106 09/17/2020    CREATININE 1.02 09/17/2020    GLU 159 (H) 09/17/2020    CALCIUM 8.8 09/17/2020      Lab Results   Component Value Date    ALBUMIN 4.2 09/17/2020    AST 46 (H)  09/17/2020    ALT 75 (H) 09/17/2020    ALKPHOS 66 09/17/2020    BILITOT 0.4 09/17/2020    Lab Results   Component Value Date    INR 1.19 10/12/2018        Lab Results   Component Value Date    CHOL 122 04/17/2019    TRIG 185 (H) 04/17/2019    A1C 6.6 (H) 09/18/2019     Lab Results   Component Value Date    HEPAIGG Nonreactive 01/04/2018    HBSAG Nonreactive 09/18/2019    HEPBSAB Nonreactive 09/18/2019    HEPBCAB Nonreactive 09/18/2019    HEPCAB Nonreactive 09/15/2018    HIV Nonreactive 01/07/2019       Lab Results   Component Value Date    TACROLIMUS 5.0 09/17/2020    TACROLIMUS 5.0 08/20/2020    TACROLIMUS 3.8 07/29/2020    TACROLIMUS 3.6 06/25/2020     Studies:  -09/17/2020 CT chest  No evidence of pulmonary metastasis.  -09/17/2020 MRI abdomen  --Sequela of liver transplantation with diffuse hepatic steatosis. No evidence of metastatic disease.  ??--Mildly increased dilation of the common hepatic duct with a questionable region of narrowing at the anastomotic site. Attention on followup to exclude developing stricture.   --Sequela of portal venous hypertension including splenomegaly and small caliber upper abdominal varices.  ??--Additional chronic and incidental findings, as above.

## 2020-09-18 DIAGNOSIS — R748 Abnormal levels of other serum enzymes: Principal | ICD-10-CM

## 2020-09-18 NOTE — Unmapped (Signed)
Received message from NP Harms today mentioning that after review of patient's labs with Dr.Fix, he recommended an IR liver bx to assure that the transaminitis is definitely r/t fatty liver. Placed order and contacted patient to let him know, encouraging him to update [facility] if he has not heard from them in the next week re.an appt. He verbalized understanding.

## 2020-09-18 NOTE — Unmapped (Signed)
Hi Mr Anthony Alvarado,  Your liver enzymes have been mildly elevated for several months. I would like to get a liver biopsy to evaluate cause. Please notify Emilio Math if you do not hear from anyone to schedule biopsy by next week.  Let me know if you have any further questions.   Rosey Bath

## 2020-09-23 NOTE — Unmapped (Signed)
error 

## 2020-09-23 NOTE — Unmapped (Signed)
Reached out to IR scheduler to inquire if liver bx had yet been approved by IR medical staff. Appt scheduled for 8/10. Spoke with patient, afterward, who was aware of appt. Reviewed importance of stopping aspiring 5 days before procedure (after 8/5 dose) and to restart the day following, unless instructed otherwise. Also reviewed NPO status 8 hrs before. He verbalized understanding.

## 2020-09-26 NOTE — Unmapped (Signed)
TRF

## 2020-09-30 NOTE — Unmapped (Signed)
VIR pre procedure call completed. Registration instructions for [facility] provided. Home medications and allergies verified. ASA last taken 09/26/20, instructed to hold diabetic meds the day of procedure. COVID screening completed.     NPO guidelines reviewed. Pt OK to take sips of clear liquids with AM meds up to 2 hours prior to procedure.  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 Pt verbalized understanding. All questions answered.

## 2020-10-01 ENCOUNTER — Other Ambulatory Visit: Payer: Self-pay | Admitting: Family Medicine

## 2020-10-01 MED ORDER — AMLODIPINE 10 MG TABLET
ORAL_TABLET | Freq: Every day | ORAL | 0 refills | 90 days
Start: 2020-10-01 — End: ?

## 2020-10-01 MED ORDER — SERTRALINE 50 MG TABLET
ORAL_TABLET | Freq: Every day | ORAL | 11 refills | 30 days
Start: 2020-10-01 — End: 2021-10-01

## 2020-10-01 MED ORDER — METFORMIN ER 500 MG TABLET,EXTENDED RELEASE 24 HR
ORAL_TABLET | Freq: Two times a day (BID) | ORAL | 11 refills | 30 days
Start: 2020-10-01 — End: 2021-10-01

## 2020-10-01 NOTE — Unmapped (Signed)
Rx refill request came in through the [facility] Clinical Pool

## 2020-10-01 NOTE — Unmapped (Signed)
[facility] Refill Coordination Note    Specialty Medication(s) to be Shipped:   Transplant: tacrolimus 1mg     Other medication(s) to be shipped: sertraline, pravastatin, alendronate, amlodipine and metformin     Anthony Alvarado, DOB: [birth year]  Phone: [phone] (home)       All above HIPAA information was verified with patient.     Was a Nurse, learning disability used for this call? No    Completed refill call assessment today to schedule patient's medication shipment from the [facility] [phone]).  All relevant notes have been reviewed.     Specialty medication(s) and dose(s) confirmed: Regimen is correct and unchanged.   Changes to medications: Anthony Alvarado reports no changes at this time.  Changes to insurance: No  New side effects reported not previously addressed with a pharmacist or physician: None reported  Questions for the pharmacist: No    Confirmed patient received a Conservation officer, historic buildings and a Surveyor, mining with first shipment. The patient will receive a drug information handout for each medication shipped and additional FDA Medication Guides as required.       DISEASE/MEDICATION-SPECIFIC INFORMATION        N/A    SPECIALTY MEDICATION ADHERENCE     Medication Adherence    Patient reported X missed doses in the last month: 2  Specialty Medication: Tacrolimus 1mg   Patient is on additional specialty medications: No        Were doses missed due to medication being on hold? No    Tacrolimus 1 mg: 8 days of medicine on hand     REFERRAL TO PHARMACIST     Referral to the pharmacist: Not needed      [facility]     Shipping address confirmed in Epic.     Delivery Scheduled: Yes, Expected medication delivery date: 10/08/2020.     Medication will be delivered via UPS to the prescription address in Epic WAM.    Lorelei Pont [facility] Specialty Technician

## 2020-10-01 NOTE — Unmapped (Shared)
[place] INTERVENTIONAL RADIOLOGY - Pre Procedure H/P      Assessment/Plan:    Anthony Alvarado is a 60 y.o. male who will undergo nontargeted percutaneous liver biopsy of OLT with moderate sedation in Interventional Radiology.    --This procedure has been fully reviewed with the patient/patient???s authorized representative. The risks, benefits and alternatives have been explained, and the patient/patient???s authorized representative has consented to the procedure.  --{stblood:31512}  --{stDNR:31507}      HPI: Anthony Alvarado is a 60 y.o. male underwent liver transplant on 09/16/2018??for cryptogenic cirrhosis with HCC.  His liver enzymes have been mildly elevated over the past several months. Unclear etiology.  Patient is here today for nontargeted percutaneous liver biopsy.  No pressures needed per transplant hepatology.      Allergies:   Allergies   Allergen Reactions   ??? Watermelon Flavor      Mouth itching       Medications:  {stmeds:31525}    ASA Grade: {asa grade vir:23729}    PE:    There were no vitals filed for this visit.  General: male in NAD.  Airway assessment: {VIR airway assessment:23727}  Lungs: Respirations nonlabored

## 2020-10-02 ENCOUNTER — Ambulatory Visit: Admit: 2020-10-02 | Discharge: 2020-10-02 | Payer: MEDICARE

## 2020-10-02 DIAGNOSIS — R748 Abnormal levels of other serum enzymes: Principal | ICD-10-CM

## 2020-10-02 DIAGNOSIS — K7400 Hepatic fibrosis, unspecified: Secondary | ICD-10-CM | POA: Diagnosis not present

## 2020-10-02 DIAGNOSIS — K760 Fatty (change of) liver, not elsewhere classified: Secondary | ICD-10-CM | POA: Diagnosis not present

## 2020-10-02 DIAGNOSIS — Z8616 Personal history of COVID-19: Secondary | ICD-10-CM | POA: Diagnosis not present

## 2020-10-02 DIAGNOSIS — R7989 Other specified abnormal findings of blood chemistry: Secondary | ICD-10-CM | POA: Diagnosis not present

## 2020-10-02 DIAGNOSIS — Z944 Liver transplant status: Secondary | ICD-10-CM | POA: Diagnosis not present

## 2020-10-02 DIAGNOSIS — R945 Abnormal results of liver function studies: Secondary | ICD-10-CM | POA: Diagnosis not present

## 2020-10-02 MED ORDER — AMLODIPINE 10 MG TABLET
ORAL_TABLET | Freq: Every morning | ORAL | 0 refills | 90.00000 days
Start: 2020-10-02 — End: ?

## 2020-10-02 MED ADMIN — lidocaine (XYLOCAINE) 10 mg/mL (1 %) injection: @ 13:00:00 | Stop: 2020-10-02

## 2020-10-02 MED ADMIN — gelatin sponge,absorb-porcine (GELFOAM) sponge: @ 13:00:00 | Stop: 2020-10-02

## 2020-10-02 MED ADMIN — midazolam (VERSED) injection: INTRAVENOUS | @ 13:00:00 | Stop: 2020-10-02

## 2020-10-02 MED ADMIN — fentaNYL (PF) (SUBLIMAZE) injection: INTRAVENOUS | @ 13:00:00 | Stop: 2020-10-02

## 2020-10-02 MED ADMIN — sodium chloride (NS) 0.9 % infusion: INTRAVENOUS | @ 13:00:00 | Stop: 2020-10-02

## 2020-10-02 NOTE — Unmapped (Signed)
VIR Post-Procedure Note    Procedure Name: Nonfocal liver biopsy    Pre-Op Diagnosis & indication: Elevated LFTs    Post-Op Diagnosis: Same as pre-operative diagnosis    VIR Attending: Everett Graff, MD    VIR Fellow/Resident/PA/NP: None    Time out: Prior to the procedure, a time out was performed with all team members present. During the time out, the patient, procedure and procedure site when applicable were verbally verified by the team members and Dr. Everett Graff.    Description of procedure: Successful nonfocal liver biopsy with no complication. Gelfoam track embolization    Plan: Bedrest x2 hrs    Sedation:Moderate sedation    Estimated Blood Loss: Minimal  Complications: None    See detailed procedure note with images in PACS [facility]).    The patient tolerated the procedure well without incident or complication.    Interventional Radiologist: Everett Graff, MD  Date/Time: 10/02/2020 8:29 AM

## 2020-10-03 ENCOUNTER — Encounter: Payer: Self-pay | Admitting: Family Medicine

## 2020-10-03 NOTE — Unmapped (Signed)
Post procedure phone call complete. Pt states his discomfort he was having yesterday has improved today. Advised to call back with additional questions or concerns.

## 2020-10-06 ENCOUNTER — Other Ambulatory Visit: Payer: Self-pay | Admitting: Family Medicine

## 2020-10-06 MED ORDER — SERTRALINE HCL 50 MG PO TABS: 50.0000 mg | ORAL_TABLET | Freq: Every day | ORAL | 3 refills | Status: DC

## 2020-10-06 MED ORDER — METFORMIN HCL ER 500 MG PO TB24: 1000.0000 mg | ORAL_TABLET | Freq: Two times a day (BID) | ORAL | 3 refills | Status: DC

## 2020-10-06 MED ORDER — METFORMIN ER 500 MG TABLET,EXTENDED RELEASE 24 HR
ORAL_TABLET | Freq: Two times a day (BID) | ORAL | 3 refills | 90 days
Start: 2020-10-06 — End: ?

## 2020-10-06 MED ORDER — SERTRALINE 50 MG TABLET
ORAL_TABLET | Freq: Every day | ORAL | 3 refills | 90 days
Start: 2020-10-06 — End: ?

## 2020-10-07 MED FILL — ALENDRONATE 70 MG TABLET: ORAL | 28 days supply | Qty: 4 | Fill #9

## 2020-10-07 MED FILL — SERTRALINE 50 MG TABLET: ORAL | 90 days supply | Qty: 90 | Fill #0

## 2020-10-07 MED FILL — METFORMIN ER 500 MG TABLET,EXTENDED RELEASE 24 HR: ORAL | 90 days supply | Qty: 360 | Fill #0

## 2020-10-07 MED FILL — TACROLIMUS 1 MG CAPSULE, IMMEDIATE-RELEASE: ORAL | 30 days supply | Qty: 120 | Fill #2

## 2020-10-07 MED FILL — PRAVASTATIN 40 MG TABLET: ORAL | 90 days supply | Qty: 90 | Fill #1

## 2020-10-07 MED FILL — AMLODIPINE 10 MG TABLET: ORAL | 90 days supply | Qty: 90 | Fill #0

## 2020-10-09 NOTE — Unmapped (Signed)
Patient's 8/10 liver bx reviewed by Dr.Shah, who commented that it confirms a diagnosis of NASH, requesting that patient be queried re.etoh consumption. If he denies, then encourage him to work on control of metabolic factors including glucose, lipids and then finally weight loss, offering appt with nutritionist.   Spoke to patient who denied any etoh intake. Relayed physician recommendations and suggested he request that his pcp tests his A1C and cholesterol. He said he will be seeing his pcp next month and agreed to discuss concerns with him then. He also said he would think about opportunity to speak with txp team nutritionist and update [facility] if he wanted an appt.

## 2020-10-12 NOTE — Unmapped (Signed)
Patient seen in clinic today for his annual liver transplant f/up visit. He is accompanied today by his ex-wife. He completed MRI and Chest CT today in keeping with RETREAT score 1 guidelines. He required IV sedation for MRI d/t claustrophobia. Today he denies any n/v/constipaton, but states he typically has loose stools. He repors he mild sob, which he has discussed with his cardiologist, Dr.Ross, whom he sees every 6 mos. Per Dr.Ross, his cardiac issues are slightly worsened, but no intervention recommended at this point.He does appear to have gained weight centrally from his last visit, which he feels might be r/t fluid retention. No swelling to LE noted, but would let him know MRI today would show ascites if present. He denies fever/chills but reports he has occasional pain/burning with urination, so had him provider urine sample for testing. Spent about 10 minutes in post-txp health maintenance education including infection prophy and cancer screening. He sees his pcp about 1x yearly and said he has an upcoming appt in Sept, and a dermatology appt in August. He had last colonoscopy noted in 2014, with recommended repeat in 10 yrs. Discussed the importance of healthy weight, lifestyle and exercise, since his lfts have been persistently mildly elevated, considered lkely fatty liver related. Patient reports he takes all his IS regularly without missing. Patient seen by NP Harms, who approved of his getting another Covid booster and 1st of Shingrix series. Encouraged patient to repeat Covid booster and Shingrix vaccine locally in 4 and 2-6 mos respectively, and that [facility] would contact him with any concerns r/t imaging. He verbalized understanding of all discussed.

## 2020-10-16 NOTE — Unmapped (Signed)
Patient's 8/10 liver bx reviewed in Pathology Conference today by Dr.Trembath in the presence of Drs.Damian Leavell and Restaurant manager, fast food. Patient's primary hepatologist, Dr.Shah, currently out of office. Dr.Trembath noted robust macrosteatosis and portal inflammation with some mild ACR at 2/9. A bit of pericellular and portal fibrosis with a bit of ballooning regeneration was also seen, but overall considered a picture of steatosis and not steatohepatitis. Lab trends reviewed and both hepatologists present indicated there was no need to start prednisone for rejection component, but patient should focus on dietary and lifestyle changes.

## 2020-10-24 DIAGNOSIS — E612 Magnesium deficiency: Secondary | ICD-10-CM | POA: Diagnosis not present

## 2020-10-24 DIAGNOSIS — Z944 Liver transplant status: Secondary | ICD-10-CM | POA: Diagnosis not present

## 2020-10-24 DIAGNOSIS — Z5181 Encounter for therapeutic drug level monitoring: Secondary | ICD-10-CM | POA: Diagnosis not present

## 2020-10-25 LAB — COMPREHENSIVE METABOLIC PANEL
A/G RATIO: 2 (ref 1.2–2.2)
ALBUMIN: 5 g/dL — ABNORMAL HIGH (ref 3.8–4.9)
ALKALINE PHOSPHATASE: 76 IU/L (ref 44–121)
ALT (SGPT): 73 IU/L — ABNORMAL HIGH (ref 0–44)
AST (SGOT): 45 IU/L — ABNORMAL HIGH (ref 0–40)
BILIRUBIN TOTAL: 0.5 mg/dL (ref 0.0–1.2)
BLOOD UREA NITROGEN: 19 mg/dL (ref 6–24)
BUN / CREAT RATIO: 17 (ref 9–20)
CALCIUM: 9.4 mg/dL (ref 8.7–10.2)
CHLORIDE: 100 mmol/L (ref 96–106)
CO2: 20 mmol/L (ref 20–29)
CREATININE: 1.14 mg/dL (ref 0.76–1.27)
GLOBULIN, TOTAL: 2.5 g/dL (ref 1.5–4.5)
GLUCOSE: 138 mg/dL — ABNORMAL HIGH (ref 65–99)
POTASSIUM: 4.8 mmol/L (ref 3.5–5.2)
SODIUM: 137 mmol/L (ref 134–144)
TOTAL PROTEIN: 7.5 g/dL (ref 6.0–8.5)

## 2020-10-25 LAB — MAGNESIUM: MAGNESIUM: 1.7 mg/dL (ref 1.6–2.3)

## 2020-10-25 LAB — CBC W/ DIFFERENTIAL
BANDED NEUTROPHILS ABSOLUTE COUNT: 0.1 10*3/uL (ref 0.0–0.1)
BASOPHILS ABSOLUTE COUNT: 0.1 10*3/uL (ref 0.0–0.2)
BASOPHILS RELATIVE PERCENT: 1 %
EOSINOPHILS ABSOLUTE COUNT: 0.3 10*3/uL (ref 0.0–0.4)
EOSINOPHILS RELATIVE PERCENT: 3 %
HEMATOCRIT: 43.6 % (ref 37.5–51.0)
HEMOGLOBIN: 14.7 g/dL (ref 13.0–17.7)
IMMATURE GRANULOCYTES: 1 %
LYMPHOCYTES ABSOLUTE COUNT: 2.1 10*3/uL (ref 0.7–3.1)
LYMPHOCYTES RELATIVE PERCENT: 22 %
MEAN CORPUSCULAR HEMOGLOBIN CONC: 33.7 g/dL (ref 31.5–35.7)
MEAN CORPUSCULAR HEMOGLOBIN: 29.8 pg (ref 26.6–33.0)
MEAN CORPUSCULAR VOLUME: 88 fL (ref 79–97)
MONOCYTES ABSOLUTE COUNT: 0.8 10*3/uL (ref 0.1–0.9)
MONOCYTES RELATIVE PERCENT: 9 %
NEUTROPHILS ABSOLUTE COUNT: 6.3 10*3/uL (ref 1.4–7.0)
NEUTROPHILS RELATIVE PERCENT: 64 %
PLATELET COUNT: 232 10*3/uL (ref 150–450)
RED BLOOD CELL COUNT: 4.94 x10E6/uL (ref 4.14–5.80)
RED CELL DISTRIBUTION WIDTH: 12.9 % (ref 11.6–15.4)
WHITE BLOOD CELL COUNT: 9.7 10*3/uL (ref 3.4–10.8)

## 2020-10-25 LAB — BILIRUBIN, DIRECT: BILIRUBIN DIRECT: 0.17 mg/dL (ref 0.00–0.40)

## 2020-10-25 LAB — PHOSPHORUS: PHOSPHORUS, SERUM: 3 mg/dL (ref 2.8–4.1)

## 2020-10-25 LAB — GAMMA GT: GAMMA GLUTAMYL TRANSFERASE: 81 IU/L — ABNORMAL HIGH (ref 0–65)

## 2020-10-26 LAB — TACROLIMUS LEVEL: TACROLIMUS BLOOD: 6.6 ng/mL (ref 2.0–20.0)

## 2020-10-30 NOTE — Unmapped (Signed)
[facility] Refill Coordination Note    Specialty Medication(s) to be Shipped:   Transplant: tacrolimus 1mg     Other medication(s) to be shipped: alendronate     Anthony Alvarado, DOB: Aug 12, 1960  Phone: [phone] (home)       All above HIPAA information was verified with patient.     Was a Nurse, learning disability used for this call? No    Completed refill call assessment today to schedule patient's medication shipment from the [facility] [phone]).  All relevant notes have been reviewed.     Specialty medication(s) and dose(s) confirmed: Regimen is correct and unchanged.   Changes to medications: Anthony Alvarado reports no changes at this time.  Changes to insurance: No  New side effects reported not previously addressed with a pharmacist or physician: None reported  Questions for the pharmacist: No    Confirmed patient received a Conservation officer, historic buildings and a Surveyor, mining with first shipment. The patient will receive a drug information handout for each medication shipped and additional FDA Medication Guides as required.       DISEASE/MEDICATION-SPECIFIC INFORMATION        N/A    SPECIALTY MEDICATION ADHERENCE     Medication Adherence    Patient reported X missed doses in the last month: 2  Specialty Medication: Tacrolimus 1mg   Patient is on additional specialty medications: No        Were doses missed due to medication being on hold? No    Tacrolimus 1 mg: 8 days of medicine on hand     REFERRAL TO PHARMACIST     Referral to the pharmacist: Not needed      [facility]     Shipping address confirmed in Epic.     Delivery Scheduled: Yes, Expected medication delivery date: 11/04/2020.     Medication will be delivered via UPS to the prescription address in Epic WAM.    Lorelei Pont [facility] Specialty Technician

## 2020-11-01 MED FILL — ALENDRONATE 70 MG TABLET: ORAL | 28 days supply | Qty: 4 | Fill #10

## 2020-11-01 MED FILL — TACROLIMUS 1 MG CAPSULE, IMMEDIATE-RELEASE: ORAL | 30 days supply | Qty: 120 | Fill #3

## 2020-11-03 ENCOUNTER — Other Ambulatory Visit: Payer: Self-pay | Admitting: Family Medicine

## 2020-11-03 DIAGNOSIS — Z944 Liver transplant status: Secondary | ICD-10-CM

## 2020-11-03 DIAGNOSIS — E119 Type 2 diabetes mellitus without complications: Secondary | ICD-10-CM

## 2020-11-03 DIAGNOSIS — I10 Essential (primary) hypertension: Secondary | ICD-10-CM

## 2020-11-03 DIAGNOSIS — E291 Testicular hypofunction: Secondary | ICD-10-CM

## 2020-11-03 DIAGNOSIS — E039 Hypothyroidism, unspecified: Secondary | ICD-10-CM

## 2020-11-03 DIAGNOSIS — Z125 Encounter for screening for malignant neoplasm of prostate: Secondary | ICD-10-CM

## 2020-11-13 ENCOUNTER — Other Ambulatory Visit: Payer: BC Managed Care – PPO

## 2020-11-18 ENCOUNTER — Other Ambulatory Visit: Payer: Self-pay

## 2020-11-18 ENCOUNTER — Encounter: Payer: Self-pay | Admitting: Family Medicine

## 2020-11-18 ENCOUNTER — Ambulatory Visit: Payer: BC Managed Care – PPO | Admitting: Family Medicine

## 2020-11-18 ENCOUNTER — Ambulatory Visit (INDEPENDENT_AMBULATORY_CARE_PROVIDER_SITE_OTHER): Payer: Medicare Other | Admitting: Family Medicine

## 2020-11-18 VITALS — BP 124/82 | HR 69 | Temp 97.8°F | Ht 69.0 in | Wt 209.0 lb

## 2020-11-18 DIAGNOSIS — E039 Hypothyroidism, unspecified: Secondary | ICD-10-CM | POA: Diagnosis not present

## 2020-11-18 DIAGNOSIS — Z7189 Other specified counseling: Secondary | ICD-10-CM

## 2020-11-18 DIAGNOSIS — Z944 Liver transplant status: Secondary | ICD-10-CM

## 2020-11-18 DIAGNOSIS — Z125 Encounter for screening for malignant neoplasm of prostate: Secondary | ICD-10-CM

## 2020-11-18 DIAGNOSIS — Z23 Encounter for immunization: Secondary | ICD-10-CM | POA: Diagnosis not present

## 2020-11-18 DIAGNOSIS — E119 Type 2 diabetes mellitus without complications: Secondary | ICD-10-CM | POA: Diagnosis not present

## 2020-11-18 DIAGNOSIS — Q231 Congenital insufficiency of aortic valve: Secondary | ICD-10-CM | POA: Diagnosis not present

## 2020-11-18 DIAGNOSIS — E291 Testicular hypofunction: Secondary | ICD-10-CM

## 2020-11-18 DIAGNOSIS — G4733 Obstructive sleep apnea (adult) (pediatric): Secondary | ICD-10-CM | POA: Diagnosis not present

## 2020-11-18 DIAGNOSIS — Z0000 Encounter for general adult medical examination without abnormal findings: Secondary | ICD-10-CM

## 2020-11-18 DIAGNOSIS — I10 Essential (primary) hypertension: Secondary | ICD-10-CM | POA: Diagnosis not present

## 2020-11-18 LAB — CBC WITH DIFFERENTIAL/PLATELET
Basophils Absolute: 0.1 10*3/uL (ref 0.0–0.1)
Basophils Relative: 1 % (ref 0.0–3.0)
Eosinophils Absolute: 0.3 10*3/uL (ref 0.0–0.7)
Eosinophils Relative: 3.2 % (ref 0.0–5.0)
HCT: 42.3 % (ref 39.0–52.0)
Hemoglobin: 14.6 g/dL (ref 13.0–17.0)
Lymphocytes Relative: 22.4 % (ref 12.0–46.0)
Lymphs Abs: 2 10*3/uL (ref 0.7–4.0)
MCHC: 34.5 g/dL (ref 30.0–36.0)
MCV: 88.9 fl (ref 78.0–100.0)
Monocytes Absolute: 0.8 10*3/uL (ref 0.1–1.0)
Monocytes Relative: 8.8 % (ref 3.0–12.0)
Neutro Abs: 5.7 10*3/uL (ref 1.4–7.7)
Neutrophils Relative %: 64.6 % (ref 43.0–77.0)
Platelets: 224 10*3/uL (ref 150.0–400.0)
RBC: 4.76 Mil/uL (ref 4.22–5.81)
RDW: 13.5 % (ref 11.5–15.5)
WBC: 8.8 10*3/uL (ref 4.0–10.5)

## 2020-11-18 LAB — COMPREHENSIVE METABOLIC PANEL
ALT: 71 U/L — ABNORMAL HIGH (ref 0–53)
AST: 40 U/L — ABNORMAL HIGH (ref 0–37)
Albumin: 4.8 g/dL (ref 3.5–5.2)
Alkaline Phosphatase: 65 U/L (ref 39–117)
BUN: 18 mg/dL (ref 6–23)
CO2: 25 mEq/L (ref 19–32)
Calcium: 9.5 mg/dL (ref 8.4–10.5)
Chloride: 101 mEq/L (ref 96–112)
Creatinine, Ser: 1.06 mg/dL (ref 0.40–1.50)
GFR: 77.38 mL/min (ref >60.00)
Glucose, Bld: 138 mg/dL — ABNORMAL HIGH (ref 70–99)
Potassium: 4.8 mEq/L (ref 3.5–5.1)
Sodium: 137 mEq/L (ref 135–145)
Total Bilirubin: 0.5 mg/dL (ref 0.2–1.2)
Total Protein: 7.6 g/dL (ref 6.0–8.3)

## 2020-11-18 LAB — MICROALBUMIN / CREATININE URINE RATIO
Creatinine,U: 79.8 mg/dL
Microalb Creat Ratio: 2.6 mg/g (ref 0.0–30.0)
Microalb, Ur: 2.1 mg/dL — ABNORMAL HIGH (ref 0.0–1.9)

## 2020-11-18 LAB — LIPID PANEL
Cholesterol: 108 mg/dL (ref 0–200)
HDL: 33.5 mg/dL — ABNORMAL LOW (ref >39.00)
NonHDL: 74.4
Total CHOL/HDL Ratio: 3
Triglycerides: 397 mg/dL — ABNORMAL HIGH (ref 0.0–149.0)
VLDL: 79.4 mg/dL — ABNORMAL HIGH (ref 0.0–40.0)

## 2020-11-18 LAB — TESTOSTERONE: Testosterone: 186.29 ng/dL — ABNORMAL LOW (ref 300.00–890.00)

## 2020-11-18 LAB — VITAMIN D 25 HYDROXY (VIT D DEFICIENCY, FRACTURES): VITD: 29.18 ng/mL — ABNORMAL LOW (ref 30.00–100.00)

## 2020-11-18 LAB — PSA: PSA: 0.96 ng/mL (ref 0.10–4.00)

## 2020-11-18 LAB — TSH: TSH: 2.07 u[IU]/mL (ref 0.35–5.50)

## 2020-11-18 LAB — LDL CHOLESTEROL, DIRECT: Direct LDL: 35 mg/dL

## 2020-11-18 LAB — HEMOGLOBIN A1C: Hgb A1c MFr Bld: 7.4 % — ABNORMAL HIGH (ref 4.6–6.5)

## 2020-11-18 MED ORDER — LEVOTHYROXINE SODIUM 150 MCG PO TABS: 150.0000 ug | ORAL_TABLET | Freq: Every day | ORAL | Status: DC

## 2020-11-18 MED ORDER — AMLODIPINE BESYLATE 10 MG PO TABS: ORAL_TABLET | ORAL | 3 refills | Status: DC

## 2020-11-18 MED ORDER — AMLODIPINE 10 MG TABLET
ORAL_TABLET | Freq: Every morning | ORAL | 3 refills | 90 days
Start: 2020-11-18 — End: ?

## 2020-11-18 NOTE — Progress Notes (Signed)
This visit occurred during the SARS-CoV-2 public health emergency.  Safety protocols were in place, including screening questions prior to the visit, additional usage of staff PPE, and extensive cleaning of exam room while observing appropriate contact time as indicated for disinfecting solutions.  I have personally reviewed the Medicare Annual Wellness questionnaire and have noted 1. The patient's medical and social history 2. Their use of alcohol, tobacco or illicit drugs 3. Their current medications and supplements 4. The patient's functional ability including ADL's, fall risks, home safety risks and hearing or visual             impairment. 5. Diet and physical activities 6. Evidence for depression or mood disorders  The patients weight, height, BMI have been recorded in the chart and visual acuity is per eye clinic.  I have made referrals, counseling and provided education to the patient based review of the above and I have provided the pt with a written personalized care plan for preventive services.  Provider list updated- see scanned forms.  Routine anticipatory guidance given to patient.  See health maintenance. The possibility exists that previously documented standard health maintenance information may have been brought forward from a previous encounter into this note.  If needed, that same information has been updated to reflect the current situation based on today's encounter.    Flu 2022 Shingles 2022 PNA up-to-date Tetanus 2021 COVID-vaccine previously done Colonoscopy 2014 Prostate cancer screening pending 2022.   Advance directive-wife and daughter Marye Round) equally designated if patient were incapacitated Cognitive function addressed- see scanned forms- and if abnormal then additional documentation follows.   Hearing and vision screen in chart.  EKG in chart.   In addition to [facility] Wellness, follow up visit for the below conditions:  S/p liver transplant.  Per  [facility].  No jaundice.  No abd pain.  Some inc in abd girth.  He had imaging per [facility] this summer and he had inc abd girth at that point.    On fosamax per outside clinic.  I'll defer, he agrees.    OSA on CPAP, compliant.  D/w pt about seeing Dr. Halford Chessman again.  Referral placed.    Diabetes:  Using medications without difficulties: yes Hypoglycemic episodes: no Hyperglycemic episodes: no Feet problems: no Blood Sugars averaging: ~120s eye exam within last year: yes Labs pending.   Hypertension:    Using medication without problems or lightheadedness: occ lightheaded but with normal BP at the time- he has checked on this.   Chest pain with exertion: no Edema:no Short of breath: he has echo pending re: AS- he can get some SOB with carrying weight for a distance.    Hypothyroidism.  He had been taking 1 tab a day, not the extra 1/2 tab on Sundays.   Off testosterone in the meantime due to cost.  He couldn't tell a change on/off med.  D/w pt.  See notes on labs.    Mood is still good and he thought sertraline was still helping some.  No ADE on med.  Would continue as is.  PMH and SH reviewed  Meds, vitals, and allergies reviewed.   ROS: Per HPI.  Unless specifically indicated otherwise in HPI, the patient denies:  General: fever. Eyes: acute vision changes ENT: sore throat Cardiovascular: chest pain Respiratory: SOB GI: vomiting GU: dysuria Musculoskeletal: acute back pain Derm: acute rash Neuro: acute motor dysfunction Psych: worsening mood Endocrine: polydipsia Heme: bleeding Allergy: hayfever  GEN: nad, alert and oriented HEENT: ncat NECK:  supple w/o LA CV: rrr. SEM noted at baseline.  Old finding. PULM: ctab, no inc wob ABD: soft, +bs, protuberant abdomen but not tender to palpation and no jaundice.  Old healed abdominal scar noted. EXT: no edema SKIN: no acute rash No jaundice.    Diabetic foot exam: Normal inspection No skin breakdown No calluses  Normal DP  pulses Normal sensation to light touch and monofilament Nails normal

## 2020-11-18 NOTE — Patient Instructions (Signed)
Flu shot today.  Covid vaccine in 1 month.  Shingles shot in 2 months.   Take care.  Glad to see you. Go to the lab on the way out.   If you have mychart we'll likely use that to update you.

## 2020-11-20 DIAGNOSIS — Z0000 Encounter for general adult medical examination without abnormal findings: Secondary | ICD-10-CM | POA: Insufficient documentation

## 2020-11-20 NOTE — Assessment & Plan Note (Signed)
Flu 2022 Shingles 2022 PNA up-to-date Tetanus 2021 COVID-vaccine previously done Colonoscopy 2014 Prostate cancer screening pending 2022.   Advance directive-wife and daughter Marye Round) equally designated if patient were incapacitated Cognitive function addressed- see scanned forms- and if abnormal then additional documentation follows.   Hearing and vision screen in chart.  EKG in chart.

## 2020-11-20 NOTE — Assessment & Plan Note (Signed)
Continue work on diet and exercise.  Continue metformin.  See notes on labs.

## 2020-11-20 NOTE — Assessment & Plan Note (Signed)
Advance directive-wife and daughter Marye Round) equally designated if patient were incapacitated

## 2020-11-20 NOTE — Assessment & Plan Note (Signed)
OSA on CPAP, compliant.  D/w pt about seeing Dr. Halford Chessman again.  Referral placed.

## 2020-11-20 NOTE — Assessment & Plan Note (Signed)
Follow-up echo pending.  I will defer.  He agrees.

## 2020-11-20 NOTE — Assessment & Plan Note (Signed)
Blood pressures controlled.  Continue amlodipine.  He has follow-up echo pending.

## 2020-11-20 NOTE — Assessment & Plan Note (Signed)
S/p liver transplant.  Per [facility].  No jaundice.  No abd pain.  Some inc in abd girth.  He had imaging per [facility] this summer and he had inc abd girth at that point.  See notes on labs.  We opted to check his labs first before doing anything else since he had previous imaging done at [facility].

## 2020-11-25 ENCOUNTER — Other Ambulatory Visit: Payer: Self-pay

## 2020-11-25 ENCOUNTER — Ambulatory Visit (HOSPITAL_COMMUNITY): Payer: Medicare Other | Attending: Internal Medicine

## 2020-11-25 DIAGNOSIS — I350 Nonrheumatic aortic (valve) stenosis: Secondary | ICD-10-CM | POA: Diagnosis not present

## 2020-11-25 LAB — ECHOCARDIOGRAM COMPLETE
AR max vel: 0.81 cm2
AV Area VTI: 0.82 cm2
AV Area mean vel: 0.8 cm2
AV Mean grad: 31 mmHg
AV Peak grad: 53.9 mmHg
Ao pk vel: 3.67 m/s
Area-P 1/2: 2.24 cm2
S' Lateral: 3.3 cm

## 2020-11-26 NOTE — Unmapped (Signed)
[facility] Clinical Assessment & Refill Coordination Note    Anthony Alvarado, [place]: Oct 27, 1960  Phone: [phone] (home)     All above HIPAA information was verified with patient.     Was a Nurse, learning disability used for this call? No    Specialty Medication(s):   Transplant: tacrolimus 1mg      Current Outpatient Medications   Medication Sig Dispense Refill   ??? alendronate (FOSAMAX) 70 MG tablet Take 1 tablet (70 mg total) by mouth every seven (7) days. 4 tablet 11   ??? amLODIPine (NORVASC) 10 MG tablet Take 1 tablet (10 mg total) by mouth daily. 30 tablet 11   ??? amLODIPine (NORVASC) 10 MG tablet Take 1 tablet (10 mg total) by mouth every morning. 90 tablet 3   ??? aspirin (ECOTRIN) 81 MG tablet Take 1 tablet (81 mg total) by mouth daily. 30 tablet 11   ??? azelastine (ASTELIN) 137 mcg (0.1 %) nasal spray 1 spray by Each Nare route daily as needed.      ??? blood sugar diagnostic (ONETOUCH ULTRA TEST) Strp USE TO CHECK SUGAR DAILY     ??? blood sugar diagnostic Strp Use as directed Three (3) times a day before meals. 90 each 11   ??? calcium carbonate (TUMS) 200 mg calcium (500 mg) chewable tablet Chew 1 tablet daily.     ??? fluticasone propionate (FLONASE) 50 mcg/actuation nasal spray PLACE ONE OR TWO SPRAYS INTO BOTH NOSTRILS DAILY AS NEEDED.     ??? lancets 33 gauge Misc 1 each by Miscellaneous route Three (3) times a day before meals. 100 each 11   ??? levothyroxine (SYNTHROID) 150 MCG tablet Take 1 tablet (150 mcg total) by mouth daily. 30 tablet 11   ??? metFORMIN (GLUCOPHAGE-XR) 500 MG 24 hr tablet Take 2 tablets (1,000 mg total) by mouth in the morning and at bedtime. 360 tablet 3   ??? mycophenolate (CELLCEPT) 250 mg capsule Take 1 capsule (250 mg total) by mouth Two (2) times a day. 60 capsule 11   ??? pravastatin (PRAVACHOL) 40 MG tablet Take 1 tablet (40 mg total) by mouth every evening. 90 tablet 3   ??? sertraline (ZOLOFT) 50 MG tablet Take 1 tablet (50 mg total) by mouth daily. 90 tablet 3   ??? tacrolimus (PROGRAF) 1 MG capsule Take 2 capsules (2 mg total) by mouth two (2) times a day. 120 capsule 11   ??? testosterone (ANDROGEL) 1 % (25 mg/2.5gram) GlPk APPLY 2.5G (1 PACKET) TO EACH SHOULDER/UPPER ARM DAILY FOR A TOTAL DAILY DOSE OF 5G 150 packet 0     No current facility-administered medications for this visit.     Facility-Administered Medications Ordered in Other Visits   Medication Dose Route Frequency Provider Last Rate Last Admin   ??? diazePAM (VALIUM) 5 mg/mL injection                 Changes to medications: [name] reports no changes at this time.    Allergies   Allergen Reactions   ??? Watermelon Flavor      Mouth itching       Changes to allergies: No    SPECIALTY MEDICATION ADHERENCE     Tacrolimus 1mg   : 8 days of medicine on hand     Medication Adherence    Patient reported X missed doses in the last month: 0  Specialty Medication: tacrolimus 1mg           Specialty medication(s) dose(s) confirmed: Regimen is correct  and unchanged.     Are there any concerns with adherence? No    Adherence counseling provided? Not needed    CLINICAL MANAGEMENT AND INTERVENTION      Clinical Benefit Assessment:    Do you feel the medicine is effective or helping your condition? Yes    Clinical Benefit counseling provided? Not needed    Adverse Effects Assessment:    Are you experiencing any side effects? No    Are you experiencing difficulty administering your medicine? No    Quality of Life Assessment:         How many days over the past month did your transplant  keep you from your normal activities? For example, brushing your teeth or getting up in the morning. 0    Have you discussed this with your provider? Not needed    Acute Infection Status:    Acute infections noted within Epic:  No active infections  Patient reported infection: None    Therapy Appropriateness:    Is therapy appropriate and patient progressing towards therapeutic goals? Yes, therapy is appropriate and should be continued    DISEASE/MEDICATION-SPECIFIC INFORMATION      N/A    PATIENT SPECIFIC NEEDS     - Does the patient have any physical, cognitive, or cultural barriers? No    - Is the patient high risk? Yes, patient is taking a REMS drug. Medication is dispensed in compliance with REMS program not from [facility]    - Does the patient require a Care Management Plan? No     - Does the patient require physician intervention or other additional services (i.e. nutrition, smoking cessation, social work)? No      SHIPPING     Specialty Medication(s) to be Shipped:   Transplant: tacrolimus 1mg     Other medication(s) to be shipped: alendronate     Changes to insurance: No    Delivery Scheduled: Yes, Expected medication delivery date: 12/02/2020.     Medication will be delivered via UPS to the confirmed prescription address in [place].    The patient will receive a drug information handout for each medication shipped and additional FDA Medication Guides as required.  Verified that patient has previously received a Conservation officer, historic buildings and a Surveyor, mining.    The patient or caregiver noted above participated in the development of this care plan and knows that they can request review of or adjustments to the care plan at any time.      All of the patient's questions and concerns have been addressed.    [name]   [facility] Specialty Pharmacist

## 2020-11-29 ENCOUNTER — Ambulatory Visit: Admit: 2020-11-29 | Discharge: 2020-11-30 | Payer: MEDICARE | Attending: Dermatology | Primary: Dermatology

## 2020-11-29 DIAGNOSIS — L709 Acne, unspecified: Principal | ICD-10-CM

## 2020-11-29 DIAGNOSIS — L281 Prurigo nodularis: Principal | ICD-10-CM

## 2020-11-29 MED ORDER — CLOBETASOL 0.05 % TOPICAL OINTMENT
Freq: Two times a day (BID) | TOPICAL | 1 refills | 0.00000 days | Status: CP
Start: 2020-11-29 — End: 2021-11-29

## 2020-11-29 MED ORDER — CLINDAMYCIN 1 % LOTION
Freq: Every day | TOPICAL | 2 refills | 0.00000 days | Status: CP
Start: 2020-11-29 — End: 2021-11-29

## 2020-11-29 MED FILL — ALENDRONATE 70 MG TABLET: ORAL | 28 days supply | Qty: 4 | Fill #11

## 2020-11-29 MED FILL — TACROLIMUS 1 MG CAPSULE, IMMEDIATE-RELEASE: ORAL | 30 days supply | Qty: 120 | Fill #4

## 2020-11-29 NOTE — Unmapped (Signed)
Dermatology Note     Assessment and Plan:      Benign Lesions/ Findings:   Lentigo/Lentigines  Seborrheic Keratosis(es) - no irritation noted  - Reassurance provided regarding the benign appearance of lesions noted on exam today; no treatment is indicated in the absence of symptoms/changes.  - Reinforced importance of photoprotective strategies including liberal and frequent sunscreen use of a broad-spectrum SPF 30 or greater, use of protective clothing, and sun avoidance for prevention of cutaneous malignancy and photoaging.  Counseled patient on the importance of regular self-skin monitoring as well as routine clinical skin examinations as scheduled.     Prurigo nodularis  - Start clobetasoL (TEMOVATE) 0.05 % ointment; Apply topically Two (2) times a day. To itchy spots  - Recommended occluding lesions after applying topical for greater efficacy   - Recommended to avoid picking or scratching lesions      Acne  - Start clindamycin (CLEOCIN T) 1 % lotion; Apply topically daily. To face for acne    ETT rosacea - not discussed today   - Offered laser but patient declined  - Continue metrocream PRN for papules    Personal history of liver transplant on immunosuppression  - Discussed maintaining vigilance and counseled on sun protection as above.    The patient was advised to call for an appointment should any new, changing, or symptomatic lesions develop.     RTC: Return in about 1 year (around 11/29/2021) for FBSE. or sooner as needed   _________________________________________________________________      Chief Complaint     FBSE and lesions of concern     HPI     Anthony Alvarado is a 60 y.o. male who presents as a returning patient (last seen by Dr. Carles Collet on 06/21/2019) to [facility] for a full body skin exam. Patient reports several lesions of concern. At last visit, patient was reassured on the benign nature of his lesions.     Today patient reports a lesion on the scalp that has been present for about a week.     Additionally he notes lesions on his legs that turn into boils.     The patient denies any other new or changing lesions or areas of concern.     Pertinent Past Medical History     No history of skin cancer   Hx of liver transplant     Family History:   Negative for melanoma    Past Medical History, Family History, Social History, Medication List, Allergies, and Problem List were reviewed in the rooming section of Epic.     ROS: Other than symptoms mentioned in the HPI, no fevers, chills, or other skin complaints    Physical Examination     GENERAL: Well-appearing male in no acute distress, resting comfortably.  NEURO: Alert and oriented, answers questions appropriately  PSYCH: Normal mood and affect  SKIN (Full Skin Exam): Examination of the face, eyelids, lips, nose, ears, neck, chest, abdomen, back, arms, legs, hands, feet, palms, soles, nails was performed, including scalp, including buttocks  - Lentigo/lentigines: Scattered pigmented macules that are tan to brown in color and are somewhat non-uniform in shape and concentrated in the sun-exposed areas of the face and upper extremities  - Seborrheic Keratosis(es): Stuck-on appearing keratotic papule(s) on the scattered diffusely, none irritated with redness, crusting, edema, and/or partial avulsion  - Few erythematous papules on the forehead and bilateral cheeks  - Lichenified papules with central excoriations on the scalp, left leg, and right flank  All areas not commented on are within normal limits or unremarkable    Scribe's Attestation: Shari Heritage, MD obtained and performed the history, physical exam and medical decision making elements that were entered into the chart. Signed by April Lannette Donath, Scribe, on November 29, 2020 at 10:18 AM.     ----------------------------------------------------------------------------------------------------------------------  December 04, 2020 12:10 PM. Documentation assistance provided by the Scribe. I was present during the time the encounter was recorded. The information recorded by the Scribe was done at my direction and has been reviewed and validated by me.  ----------------------------------------------------------------------------------------------------------------------      (Approved Template 11/06/2019)

## 2020-11-29 NOTE — Unmapped (Addendum)
For prurigo nodularis:  - Start applying clobetasol ointment twice daily and cover with a saran wrap.     For acne:   - Start applying clindamycin lotion to the face daily      Patient Education        Skin Cancer Prevention: Care Instructions  Your Care Instructions     Skin cancer is the abnormal growth of cells in the skin. It usually appears as a growth that changes in color, shape, or size. This can be a sore that does not heal or a change in a mole. Skin cancer is almost always curable when found early and treated. So it is important to see your doctor if you have any of these changes in your skin.  Skin cancer is the most common type of cancer. It often appears on areas of the body that have been exposed to the sun, such as the head, face, neck, back, chest, or shoulders.  Follow-up care is a key part of your treatment and safety. Be sure to make and go to all appointments, and call your doctor if you are having problems. It's also a good idea to know your test results and keep a list of the medicines you take.  How can you care for yourself at home?  Wear a wide-brimmed hat and long sleeves and pants if you are going to be outdoors for a long time.  Avoid the sun between 10 a.m. and 4 p.m., which is the peak time for UV rays.  Wear sunscreen on exposed skin. Make sure to use a broad-spectrum sunscreen that has a sun protection factor (SPF) of 30 or higher. Use it every day, even when it is cloudy.  Do not use tanning booths or sunlamps.  Use lip balm or cream that has sun protection factor (SPF) to protect your lips from getting sunburned.  Wear sunglasses that block UV rays.  When should you call for help?  Watch closely for changes in your health, and be sure to contact your doctor if:    You see a change in your skin, such as a growth or mole that:  Grows bigger. This may happen very slowly.  Changes color.  Changes shape.  Starts to bleed easily.     You have swollen glands in your armpits, groin, or neck.     You do not get better as expected.   Where can you learn more?  Go to [facility] at [email or web address].Armed forces logistics/support/administrative officer in the Menu. Enter P392 in the search box to learn more about Skin Cancer Prevention: Care Instructions.  Current as of: January 08, 2020               Content Version: 13.4  ?? 2006-2022 [employer].   Care instructions adapted under license by [facility]. If you have questions about a medical condition or this instruction, always ask your healthcare professional. [employer] disclaims any warranty or liability for your use of this information.

## 2020-12-04 ENCOUNTER — Telehealth: Payer: Self-pay

## 2020-12-04 DIAGNOSIS — I350 Nonrheumatic aortic (valve) stenosis: Secondary | ICD-10-CM

## 2020-12-04 NOTE — Telephone Encounter (Signed)
-----   Message from Fay Records, MD sent at 12/03/2020  5:35 PM EDT ----- Reviewed with patient  I do think the valve has progressed and is severely stenotic now.   When I saw him in clinic he has had some SOB but it is more prominent I have told him about the structural team and that I'd like to refer him for evaluation Pt is agreeable with plan

## 2020-12-04 NOTE — Telephone Encounter (Signed)
Referral placed for Structural Heart Team.

## 2020-12-13 DIAGNOSIS — Z944 Liver transplant status: Secondary | ICD-10-CM | POA: Diagnosis not present

## 2020-12-13 DIAGNOSIS — E612 Magnesium deficiency: Secondary | ICD-10-CM | POA: Diagnosis not present

## 2020-12-13 DIAGNOSIS — Z5181 Encounter for therapeutic drug level monitoring: Secondary | ICD-10-CM | POA: Diagnosis not present

## 2020-12-14 LAB — CBC W/ DIFFERENTIAL
BANDED NEUTROPHILS ABSOLUTE COUNT: 0.1 10*3/uL (ref 0.0–0.1)
BASOPHILS ABSOLUTE COUNT: 0.1 10*3/uL (ref 0.0–0.2)
BASOPHILS RELATIVE PERCENT: 1 %
EOSINOPHILS ABSOLUTE COUNT: 0.4 10*3/uL (ref 0.0–0.4)
EOSINOPHILS RELATIVE PERCENT: 4 %
HEMATOCRIT: 42.7 % (ref 37.5–51.0)
HEMOGLOBIN: 14.7 g/dL (ref 13.0–17.7)
IMMATURE GRANULOCYTES: 1 %
LYMPHOCYTES ABSOLUTE COUNT: 2.2 10*3/uL (ref 0.7–3.1)
LYMPHOCYTES RELATIVE PERCENT: 23 %
MEAN CORPUSCULAR HEMOGLOBIN CONC: 34.4 g/dL (ref 31.5–35.7)
MEAN CORPUSCULAR HEMOGLOBIN: 30.6 pg (ref 26.6–33.0)
MEAN CORPUSCULAR VOLUME: 89 fL (ref 79–97)
MONOCYTES ABSOLUTE COUNT: 0.8 10*3/uL (ref 0.1–0.9)
MONOCYTES RELATIVE PERCENT: 9 %
NEUTROPHILS ABSOLUTE COUNT: 5.8 10*3/uL (ref 1.4–7.0)
NEUTROPHILS RELATIVE PERCENT: 62 %
PLATELET COUNT: 238 10*3/uL (ref 150–450)
RED BLOOD CELL COUNT: 4.8 x10E6/uL (ref 4.14–5.80)
RED CELL DISTRIBUTION WIDTH: 12.8 % (ref 11.6–15.4)
WHITE BLOOD CELL COUNT: 9.4 10*3/uL (ref 3.4–10.8)

## 2020-12-14 LAB — COMPREHENSIVE METABOLIC PANEL
A/G RATIO: 2 (ref 1.2–2.2)
ALBUMIN: 5.1 g/dL — ABNORMAL HIGH (ref 3.8–4.9)
ALKALINE PHOSPHATASE: 84 IU/L (ref 44–121)
ALT (SGPT): 78 IU/L — ABNORMAL HIGH (ref 0–44)
AST (SGOT): 51 IU/L — ABNORMAL HIGH (ref 0–40)
BILIRUBIN TOTAL: 0.3 mg/dL (ref 0.0–1.2)
BLOOD UREA NITROGEN: 14 mg/dL (ref 6–24)
BUN / CREAT RATIO: 13 (ref 9–20)
CALCIUM: 9.7 mg/dL (ref 8.7–10.2)
CHLORIDE: 103 mmol/L (ref 96–106)
CO2: 23 mmol/L (ref 20–29)
CREATININE: 1.08 mg/dL (ref 0.76–1.27)
GLOBULIN, TOTAL: 2.6 g/dL (ref 1.5–4.5)
GLUCOSE: 154 mg/dL — ABNORMAL HIGH (ref 70–99)
POTASSIUM: 4.8 mmol/L (ref 3.5–5.2)
SODIUM: 139 mmol/L (ref 134–144)
TOTAL PROTEIN: 7.7 g/dL (ref 6.0–8.5)

## 2020-12-14 LAB — BILIRUBIN, DIRECT: BILIRUBIN DIRECT: 0.12 mg/dL (ref 0.00–0.40)

## 2020-12-14 LAB — GAMMA GT: GAMMA GLUTAMYL TRANSFERASE: 78 IU/L — ABNORMAL HIGH (ref 0–65)

## 2020-12-14 LAB — MAGNESIUM: MAGNESIUM: 1.7 mg/dL (ref 1.6–2.3)

## 2020-12-14 LAB — PHOSPHORUS: PHOSPHORUS, SERUM: 3.1 mg/dL (ref 2.8–4.1)

## 2020-12-16 ENCOUNTER — Ambulatory Visit: Payer: Medicare Other | Admitting: Cardiovascular Disease

## 2020-12-16 ENCOUNTER — Other Ambulatory Visit: Payer: Self-pay

## 2020-12-16 ENCOUNTER — Encounter: Payer: Self-pay | Admitting: Cardiovascular Disease

## 2020-12-16 VITALS — BP 128/90 | HR 73 | Ht 69.0 in | Wt 208.0 lb

## 2020-12-16 DIAGNOSIS — Z01818 Encounter for other preprocedural examination: Secondary | ICD-10-CM

## 2020-12-16 DIAGNOSIS — Z01812 Encounter for preprocedural laboratory examination: Secondary | ICD-10-CM

## 2020-12-16 DIAGNOSIS — I350 Nonrheumatic aortic (valve) stenosis: Secondary | ICD-10-CM

## 2020-12-16 LAB — TACROLIMUS LEVEL: TACROLIMUS BLOOD: 5 ng/mL (ref 2.0–20.0)

## 2020-12-16 NOTE — H&P (View-Only) (Signed)
Structural Heart Clinic Consult Note  Chief Complaint  Patient presents with   New Patient (Initial Visit)    Aortic stenosis   History of Present Illness: 60 yo male with history of diabetes mellitus, GERD, HTN, liver cirrhosis with prior liver transplant, sleep apnea, CAD and bicuspid aortic with severe aortic stenosis who is here today as a new consult, referred by [name], for further discussion regarding his aortic stenosis and possible TAVR. He has DM and is on metformin only. He is known to have mild CAD by cardiac cath in 2020. He underwent liver transplant at [facility] in 2020 for cirrhosis not related to alcohol abuse. He has been followed for moderate aortic stenosis with likely bicuspid aortic valve. Most recent echo 11/25/20 with LVEF=60-65% with moderate LVH. The aortic valve leaflets are thickened and calcified and do not open well. The right and left cusps are fused. Mean gradient 31 mm Hg, peak gradient 53.9 mmHg, SVI 32, AVA 0.8 cm2, DVI 0.22. This is consistent with severe aortic stenosis.   He tells me today that he has had progressive dyspnea on exertion. He has no lower extremity edema, chest pain. He has had dizziness for years but no change recently. He lives in [place], [place] alone. He has one child and is separated from his wife. He is on disability. He has no active dental issues. No recent dental visit.   Primary Care Physician: [name], MD Primary Cardiologist: Harrington Challenger Referring Cardiologist: Harrington Challenger  Past Medical History:  Diagnosis Date   Allergy    Asthma    Bicuspid aortic valve    sees dr Harrington Challenger   Cirrhosis [facility])    Diabetes mellitus without complication ([place])    Fatty liver    with h/o elevated LFT's   GERD (gastroesophageal reflux disease)    Heart murmur    Hypertension    Itching    all over last few months   Jaundice    Liver transplant recipient [facility])    09/16/2018 at [facility]   Migraine with aura    OSA (obstructive sleep apnea) 09/14/2011   PSG  11/08/11>>AHI 31.6, SpO2 low 85%. wears CPAP, pt does not know settings   Other abnormal glucose    diet controlled diabetic   Thyroid disease     Past Surgical History:  Procedure Laterality Date   APPENDECTOMY  10/09   Emergency   BIOPSY THYROID  08/19/07   Attempted, no tissue obtained   CARDIAC CATHETERIZATION  03/21/2018   CARDIOVASCULAR STRESS TEST  03/07   Negative 06/05   DOPPLER ECHOCARDIOGRAPHY  06/04   ESOPHAGEAL BANDING N/A 05/04/2016   Procedure: ESOPHAGEAL BANDING;  Surgeon: [name], MD;  Location: [facility] ENDOSCOPY;  Service: Endoscopy;  Laterality: N/A;   ESOPHAGOGASTRODUODENOSCOPY (EGD) WITH PROPOFOL N/A 05/04/2016   Procedure: ESOPHAGOGASTRODUODENOSCOPY (EGD) WITH PROPOFOL;  Surgeon: [name], MD;  Location: [facility] ENDOSCOPY;  Service: Endoscopy;  Laterality: N/A;   LIVER TRANSPLANT     09/16/2018 at [facility]    Current Outpatient Medications  Medication Sig Dispense Refill   acetaminophen (TYLENOL) 325 MG tablet Take 325 mg by mouth every 6 (six) hours as needed for mild pain.     alendronate (FOSAMAX) 70 MG tablet Take 70 mg by mouth once a week. Take with a full glass of water on an empty stomach.     amLODipine (NORVASC) 10 MG tablet Take 1 tablet (10 mg total) by mouth in the morning. 90 tablet 3  aspirin EC 81 MG tablet Take 81 mg by mouth daily.     azelastine (ASTELIN) 0.1 % nasal spray PLACE 2 SPRAYS INTO BOTH NOSTRILS TWICE DAILY AS NEEDED 30 mL 1   calcium carbonate (TUMS - DOSED IN MG ELEMENTAL CALCIUM) 500 MG chewable tablet Chew 1 tablet by mouth daily.     cholecalciferol (VITAMIN D) 25 MCG (1000 UT) tablet Take 1,000 Units by mouth 2 (two) times daily.     fluticasone (FLONASE) 50 MCG/ACT nasal spray PLACE ONE OR TWO SPRAYS INTO BOTH NOSTRILS DAILY AS NEEDED. 48 g 3   levothyroxine (SYNTHROID) 150 MCG tablet Take 1 tablet (150 mcg total) by mouth daily before breakfast.     metFORMIN (GLUCOPHAGE-XR) 500 MG 24 hr tablet Take 2 tablets (1,000 mg total) by  mouth in the morning and at bedtime. 360 tablet 3   metroNIDAZOLE (METROGEL) 1 % gel Apply topically daily.     mycophenolate (CELLCEPT) 250 MG capsule Take by mouth.     OneTouch Delica Lancets 59R MISC USE TO CHECK SUGAR DAILY 100 each 3   ONETOUCH ULTRA test strip USE TO CHECK SUGAR DAILY 100 strip 0   sertraline (ZOLOFT) 50 MG tablet Take 1 tablet (50 mg total) by mouth daily. 90 tablet 3   tacrolimus (PROGRAF) 1 MG capsule Take by mouth.     No current facility-administered medications for this visit.    Allergies  Allergen Reactions   Watermelon Flavor     Mouth itching    Social History   Socioeconomic History   Marital status: Legally Separated    Spouse name: Not on file   Number of children: 1   Years of education: Not on file   Highest education level: Not on file  Occupational History   Occupation: Sports coach: [employer]    Comment: Manages "front" and writes orders --in [place]   Occupation: Disabled  Tobacco Use   Smoking status: Never   Smokeless tobacco: Never  Vaping Use   Vaping Use: Never used  Substance and Sexual Activity   Alcohol use: No   Drug use: No   Sexual activity: Not on file  Other Topics Concern   Not on file  Social History Narrative   Married, 1989   1 child and 2 stepchildren   Prev ran a Programmer, systems Determinants of Radio broadcast assistant Strain: Not on file  Food Insecurity: Not on file  Transportation Needs: Not on file  Physical Activity: Not on file  Stress: Not on file  Social Connections: Not on file  Intimate Partner Violence: Not on file    Family History  Problem Relation Age of Onset   Asthma Mother    Cancer Father        Died when pt was 61 of CA with mets, site unknown, COPD   COPD Father    Heart disease Sister        Heart stopped   Liver disease Sister        "gene" for liver disease   Colon cancer Maternal Grandmother    Heart disease Maternal Grandfather         MI, 63 YOA   Prostate cancer Maternal Uncle    Esophageal cancer Neg Hx    Rectal cancer Neg Hx    Stomach cancer Neg Hx     Review of Systems:  As stated in the HPI and otherwise negative.   BP  128/90 (BP Location: Left Arm, Patient Position: Sitting, Cuff Size: Normal)   Pulse 73   Ht 5\' 9"  (1.753 m)   Wt 208 lb (94.3 kg)   BMI 30.72 kg/m   Physical Examination: General: Well developed, well nourished, NAD  HEENT: OP clear, mucus membranes moist  SKIN: warm, dry. No rashes. Neuro: No focal deficits  Musculoskeletal: Muscle strength 5/5 all ext  Psychiatric: Mood and affect normal  Neck: No JVD, no carotid bruits, no thyromegaly, no lymphadenopathy.  Lungs:Clear bilaterally, no wheezes, rhonci, crackles Cardiovascular: Regular rate and rhythm. Loud, harsh, late peaking systolic murmur.  Abdomen:Soft. Bowel sounds present. Non-tender.  Extremities: No lower extremity edema. Pulses are 2 + in the bilateral DP/PT.  EKG:  EKG is ordered today. The ekg ordered today demonstrates Sinus  Echo 103/22:  1. Left ventricular ejection fraction, by estimation, is 60 to 65%. The  left ventricle has normal function. The left ventricle has no regional  wall motion abnormalities. There is moderate left ventricular hypertrophy.  Left ventricular diastolic  parameters were normal.   2. Right ventricular systolic function is normal. The right ventricular  size is normal.   3. Left atrial size was mildly dilated.   4. The mitral valve is abnormal. Trivial mitral valve regurgitation. No  evidence of mitral stenosis. Moderate mitral annular calcification.   5. Gradients lower than TTE done 06/18/20 DVI lower prevoiusly 0.31 AVA  similar Fused right and left cusps. The aortic valve is calcified. There  is severe calcifcation of the aortic valve. There is severe thickening of  the aortic valve. Aortic valve  regurgitation is not visualized. Moderate to severe aortic valve stenosis.   6. There  is moderate dilatation of the aortic root, measuring 42 mm.   7. The inferior vena cava is normal in size with greater than 50%  respiratory variability, suggesting right atrial pressure of 3 mmHg.   FINDINGS   Left Ventricle: Left ventricular ejection fraction, by estimation, is 60  to 65%. The left ventricle has normal function. The left ventricle has no  regional wall motion abnormalities. The left ventricular internal cavity  size was normal in size. There is   moderate left ventricular hypertrophy. Left ventricular diastolic  parameters were normal.   Right Ventricle: The right ventricular size is normal. No increase in  right ventricular wall thickness. Right ventricular systolic function is  normal.   Left Atrium: Left atrial size was mildly dilated.   Right Atrium: Right atrial size was normal in size.   Pericardium: There is no evidence of pericardial effusion.   Mitral Valve: The mitral valve is abnormal. There is mild thickening of  the mitral valve leaflet(s). There is mild calcification of the mitral  valve leaflet(s). Moderate mitral annular calcification. Trivial mitral  valve regurgitation. No evidence of  mitral valve stenosis.   Tricuspid Valve: The tricuspid valve is normal in structure. Tricuspid  valve regurgitation is not demonstrated. No evidence of tricuspid  stenosis.   Aortic Valve: Gradients lower than TTE done 06/18/20 DVI lower prevoiusly  0.31 AVA similar Fused right and left cusps. The aortic valve is  calcified. There is severe calcifcation of the aortic valve. There is  severe thickening of the aortic valve. Aortic  valve regurgitation is not visualized. Moderate to severe aortic stenosis  is present. Aortic valve mean gradient measures 31.0 mmHg. Aortic valve  peak gradient measures 53.9 mmHg. Aortic valve area, by VTI measures 0.82  cm.   Pulmonic Valve: The  pulmonic valve was normal in structure. Pulmonic valve  regurgitation is not  visualized. No evidence of pulmonic stenosis.   Aorta: The ascending aorta was not well visualized. There is moderate  dilatation of the aortic root, measuring 42 mm.   Venous: The inferior vena cava is normal in size with greater than 50%  respiratory variability, suggesting right atrial pressure of 3 mmHg.   IAS/Shunts: No atrial level shunt detected by color flow Doppler.      LEFT VENTRICLE  PLAX 2D  LVIDd:         4.70 cm  Diastology  LVIDs:         3.30 cm  LV e' medial:    5.77 cm/s  LV PW:         1.10 cm  LV E/e' medial:  16.6  LV IVS:        1.60 cm  LV e' lateral:   9.57 cm/s  LVOT diam:     2.20 cm  LV E/e' lateral: 10.0  LV SV:         67  LV SV Index:   32  LVOT Area:     3.80 cm      LEFT ATRIUM             Index       RIGHT ATRIUM           Index  LA diam:        4.00 cm 1.90 cm/m  RA Pressure: 3.00 mmHg  LA Vol (A2C):   67.1 ml 31.88 ml/m RA Area:     19.30 cm  LA Vol (A4C):   48.4 ml 22.99 ml/m RA Volume:   58.70 ml  27.89 ml/m  LA Biplane Vol: 62.3 ml 29.60 ml/m   AORTIC VALVE  AV Area (Vmax):    0.81 cm  AV Area (Vmean):   0.80 cm  AV Area (VTI):     0.82 cm  AV Vmax:           367.00 cm/s  AV Vmean:          259.500 cm/s  AV VTI:            0.822 m  AV Peak Grad:      53.9 mmHg  AV Mean Grad:      31.0 mmHg  LVOT Vmax:         78.20 cm/s  LVOT Vmean:        54.600 cm/s  LVOT VTI:          0.177 m  LVOT/AV VTI ratio: 0.22     AORTA  Ao Root diam: 4.20 cm  Ao Asc diam:  3.80 cm   MITRAL VALVE                TRICUSPID VALVE  MV Area (PHT): 2.24 cm     Estimated RAP:  3.00 mmHg  MV Decel Time: 338 msec  MV E velocity: 95.90 cm/s   SHUNTS  MV A velocity: 113.00 cm/s  Systemic VTI:  0.18 m  MV E/A ratio:  0.85         Systemic Diam: 2.20 cm   Recent Labs: 11/18/2020: ALT 71; BUN 18; Creatinine, Ser 1.06; Hemoglobin 14.6; Platelets 224.0; Potassium 4.8; Sodium 137; TSH 2.07    Wt Readings from Last 3 Encounters:  12/16/20 208 lb (94.3  kg)  11/18/20 209 lb (94.8 kg)  05/20/20 213 lb (96.6 kg)  Other studies Reviewed: Additional studies/ records that were reviewed today include: Echo images, EKG, office notes Review of the above records demonstrates: severe bicuspid aortic stenosis   STS Risk Score: Risk of Mortality: 1.015% Renal Failure: 1.730% Permanent Stroke: 0.823% Prolonged Ventilation: 3.153% DSW Infection: 0.129% Reoperation: 3.198% Morbidity or Mortality: 7.052% Short Length of Stay: 59.916% Long Length of Stay: 2.319%  Assessment and Plan:   1. Severe Aortic Valve Stenosis: He has severe, stage D aortic valve stenosis. I have personally reviewed the echo images. The aortic valve is thickened, calcified with limited leaflet mobility. The aortic valve is likely bicuspid. I think he would benefit from AVR. Given his cirrhosis and liver transplant, he may not be a good candidate for conventional AVR by surgical approach. I do not think his advanced liver disease is captured adequately in his STS risk score. I think he should be a good candidate for TAVR. Following testing, will have him seen  in the CT surgery office to review surgical risk.   I have reviewed the natural history of aortic stenosis with the patient and their family members  who are present today. We have discussed the limitations of medical therapy and the poor prognosis associated with symptomatic aortic stenosis. We have reviewed potential treatment options, including palliative medical therapy, conventional surgical aortic valve replacement, and transcatheter aortic valve replacement. We discussed treatment options in the context of the patient's specific comorbid medical conditions.   He would like to proceed with planning for TAVR. I will arrange a right and left heart catheterization at [facility] 12/20/20 at 10:30am. Risks and benefits of the cath procedure and the valve procedure are reviewed with the patient. After the cath, he will  have a cardiac CT, CTA of the chest/abdomen and pelvis and PT assessment and will then be referred to see one of the CT surgeons on our TAVR team.      Current medicines are reviewed at length with the patient today.  The patient does not have concerns regarding medicines.  The following changes have been made:  no change  Labs/ tests ordered today include:   Orders Placed This Encounter  Procedures   EKG 12-Lead      Disposition:   F/U with the valve team.    Signed, Lauree Chandler, MD 12/16/2020 10:47 AM    [facility] [place], [place], [place]  [address] Phone: [phone]; Fax: [phone]

## 2020-12-16 NOTE — Patient Instructions (Addendum)
Medication Instructions:  No Changes *If you need a refill on your cardiac medications before your next appointment, please call your pharmacy*   Lab Work: IN CARE EVERYWHERE FROM 12/13/20  Testing/Procedures: Your physician has requested that you have a cardiac catheterization. Cardiac catheterization is used to diagnose and/or treat various heart conditions. Doctors may recommend this procedure for a number of different reasons. The most common reason is to evaluate chest pain. Chest pain can be a symptom of coronary artery disease (CAD), and cardiac catheterization can show whether plaque is narrowing or blocking your heart's arteries. This procedure is also used to evaluate the valves, as well as measure the blood flow and oxygen levels in different parts of your heart. For further information please visit [email or web address]. Please follow instruction sheet, as given.   Follow-Up: Per Structural Heart Valve Team  Other Instructions  [facility] OFF[facility] [place], [address] [place] [address] Dept: [phone] Loc: [place]  12/16/2020  You are scheduled for a Cardiac Catheterization on Friday, October 28 with Dr. Lauree Chandler.  1. Please arrive at the [facility] (Main Entrance A) at [facility]: [address] [place], [place] [address] at 8:30 AM (This time is two hours before your procedure to ensure your preparation). Free valet parking service is available.   Special note: Every effort is made to have your procedure done on time. Please understand that emergencies sometimes delay scheduled procedures.  2. Diet: Do not eat solid foods after midnight.  The patient may have clear liquids until 5am upon the day of the procedure.  3. Labs: You will need to have blood drawn today.  You do not need to be fasting.  4. Medication instructions in preparation for  your procedure:   Contrast Allergy: No   Do not take Diabetes Med Glucophage (Metformin) on the day of the procedure and HOLD 48 HOURS AFTER THE PROCEDURE.  On the morning of your procedure, take your Aspirin and any morning medicines NOT listed above.  You may use sips of water.  5. Plan for one night stay--bring personal belongings. 6. Bring a current list of your medications and current insurance cards. 7. You MUST have a responsible person to drive you home. 8. Someone MUST be with you the first 24 hours after you arrive home or your discharge will be delayed. 9. Please wear clothes that are easy to get on and off and wear slip-on shoes.  Thank you for allowing [place] to care for you!   -- [place] [facility]

## 2020-12-16 NOTE — Progress Notes (Signed)
Structural Heart Clinic Consult Note  Chief Complaint  Patient presents with   New Patient (Initial Visit)    Aortic stenosis   History of Present Illness: 60 yo male with history of diabetes mellitus, GERD, HTN, liver cirrhosis with prior liver transplant, sleep apnea, CAD and bicuspid aortic with severe aortic stenosis who is here today as a new consult, referred by [name], for further discussion regarding his aortic stenosis and possible TAVR. He has DM and is on metformin only. He is known to have mild CAD by cardiac cath in 2020. He underwent liver transplant at [facility] in 2020 for cirrhosis not related to alcohol abuse. He has been followed for moderate aortic stenosis with likely bicuspid aortic valve. Most recent echo 11/25/20 with LVEF=60-65% with moderate LVH. The aortic valve leaflets are thickened and calcified and do not open well. The right and left cusps are fused. Mean gradient 31 mm Hg, peak gradient 53.9 mmHg, SVI 32, AVA 0.8 cm2, DVI 0.22. This is consistent with severe aortic stenosis.   He tells me today that he has had progressive dyspnea on exertion. He has no lower extremity edema, chest pain. He has had dizziness for years but no change recently. He lives in [place], [place] alone. He has one child and is separated from his wife. He is on disability. He has no active dental issues. No recent dental visit.   Primary Care Physician: [name], MD Primary Cardiologist: Harrington Challenger Referring Cardiologist: Harrington Challenger  Past Medical History:  Diagnosis Date   Allergy    Asthma    Bicuspid aortic valve    sees dr Harrington Challenger   Cirrhosis [facility])    Diabetes mellitus without complication ([place])    Fatty liver    with h/o elevated LFT's   GERD (gastroesophageal reflux disease)    Heart murmur    Hypertension    Itching    all over last few months   Jaundice    Liver transplant recipient [facility])    09/16/2018 at [facility]   Migraine with aura    OSA (obstructive sleep apnea) 09/14/2011   PSG  11/08/11>>AHI 31.6, SpO2 low 85%. wears CPAP, pt does not know settings   Other abnormal glucose    diet controlled diabetic   Thyroid disease     Past Surgical History:  Procedure Laterality Date   APPENDECTOMY  10/09   Emergency   BIOPSY THYROID  08/19/07   Attempted, no tissue obtained   CARDIAC CATHETERIZATION  03/21/2018   CARDIOVASCULAR STRESS TEST  03/07   Negative 06/05   DOPPLER ECHOCARDIOGRAPHY  06/04   ESOPHAGEAL BANDING N/A 05/04/2016   Procedure: ESOPHAGEAL BANDING;  Surgeon: [name], MD;  Location: [facility];  Service: Endoscopy;  Laterality: N/A;   ESOPHAGOGASTRODUODENOSCOPY (EGD) WITH PROPOFOL N/A 05/04/2016   Procedure: ESOPHAGOGASTRODUODENOSCOPY (EGD) WITH PROPOFOL;  Surgeon: [name], MD;  Location: [facility];  Service: Endoscopy;  Laterality: N/A;   LIVER TRANSPLANT     09/16/2018 at [facility]    Current Outpatient Medications  Medication Sig Dispense Refill   acetaminophen (TYLENOL) 325 MG tablet Take 325 mg by mouth every 6 (six) hours as needed for mild pain.     alendronate (FOSAMAX) 70 MG tablet Take 70 mg by mouth once a week. Take with a full glass of water on an empty stomach.     amLODipine (NORVASC) 10 MG tablet Take 1 tablet (10 mg total) by mouth in the morning. 90 tablet 3  aspirin EC 81 MG tablet Take 81 mg by mouth daily.     azelastine (ASTELIN) 0.1 % nasal spray PLACE 2 SPRAYS INTO BOTH NOSTRILS TWICE DAILY AS NEEDED 30 mL 1   calcium carbonate (TUMS - DOSED IN MG ELEMENTAL CALCIUM) 500 MG chewable tablet Chew 1 tablet by mouth daily.     cholecalciferol (VITAMIN D) 25 MCG (1000 UT) tablet Take 1,000 Units by mouth 2 (two) times daily.     fluticasone (FLONASE) 50 MCG/ACT nasal spray PLACE ONE OR TWO SPRAYS INTO BOTH NOSTRILS DAILY AS NEEDED. 48 g 3   levothyroxine (SYNTHROID) 150 MCG tablet Take 1 tablet (150 mcg total) by mouth daily before breakfast.     metFORMIN (GLUCOPHAGE-XR) 500 MG 24 hr tablet Take 2 tablets (1,000 mg total) by  mouth in the morning and at bedtime. 360 tablet 3   metroNIDAZOLE (METROGEL) 1 % gel Apply topically daily.     mycophenolate (CELLCEPT) 250 MG capsule Take by mouth.     OneTouch Delica Lancets 68T MISC USE TO CHECK SUGAR DAILY 100 each 3   ONETOUCH ULTRA test strip USE TO CHECK SUGAR DAILY 100 strip 0   sertraline (ZOLOFT) 50 MG tablet Take 1 tablet (50 mg total) by mouth daily. 90 tablet 3   tacrolimus (PROGRAF) 1 MG capsule Take by mouth.     No current facility-administered medications for this visit.    Allergies  Allergen Reactions   Watermelon Flavor     Mouth itching    Social History   Socioeconomic History   Marital status: Legally Separated    Spouse name: Not on file   Number of children: 1   Years of education: Not on file   Highest education level: Not on file  Occupational History   Occupation: Sports coach: [employer]    Comment: Manages "front" and writes orders --in [place]   Occupation: Disabled  Tobacco Use   Smoking status: Never   Smokeless tobacco: Never  Vaping Use   Vaping Use: Never used  Substance and Sexual Activity   Alcohol use: No   Drug use: No   Sexual activity: Not on file  Other Topics Concern   Not on file  Social History Narrative   Married, 1989   1 child and 2 stepchildren   Prev ran a Programmer, systems Determinants of Radio broadcast assistant Strain: Not on file  Food Insecurity: Not on file  Transportation Needs: Not on file  Physical Activity: Not on file  Stress: Not on file  Social Connections: Not on file  Intimate Partner Violence: Not on file    Family History  Problem Relation Age of Onset   Asthma Mother    Cancer Father        Died when pt was 6 of CA with mets, site unknown, COPD   COPD Father    Heart disease Sister        Heart stopped   Liver disease Sister        "gene" for liver disease   Colon cancer Maternal Grandmother    Heart disease Maternal Grandfather         MI, 47 YOA   Prostate cancer Maternal Uncle    Esophageal cancer Neg Hx    Rectal cancer Neg Hx    Stomach cancer Neg Hx     Review of Systems:  As stated in the HPI and otherwise negative.   BP  128/90 (BP Location: Left Arm, Patient Position: Sitting, Cuff Size: Normal)   Pulse 73   Ht 5\' 9"  (1.753 m)   Wt 208 lb (94.3 kg)   BMI 30.72 kg/m   Physical Examination: General: Well developed, well nourished, NAD  HEENT: OP clear, mucus membranes moist  SKIN: warm, dry. No rashes. Neuro: No focal deficits  Musculoskeletal: Muscle strength 5/5 all ext  Psychiatric: Mood and affect normal  Neck: No JVD, no carotid bruits, no thyromegaly, no lymphadenopathy.  Lungs:Clear bilaterally, no wheezes, rhonci, crackles Cardiovascular: Regular rate and rhythm. Loud, harsh, late peaking systolic murmur.  Abdomen:Soft. Bowel sounds present. Non-tender.  Extremities: No lower extremity edema. Pulses are 2 + in the bilateral DP/PT.  EKG:  EKG is ordered today. The ekg ordered today demonstrates Sinus  Echo 103/22:  1. Left ventricular ejection fraction, by estimation, is 60 to 65%. The  left ventricle has normal function. The left ventricle has no regional  wall motion abnormalities. There is moderate left ventricular hypertrophy.  Left ventricular diastolic  parameters were normal.   2. Right ventricular systolic function is normal. The right ventricular  size is normal.   3. Left atrial size was mildly dilated.   4. The mitral valve is abnormal. Trivial mitral valve regurgitation. No  evidence of mitral stenosis. Moderate mitral annular calcification.   5. Gradients lower than TTE done 06/18/20 DVI lower prevoiusly 0.31 AVA  similar Fused right and left cusps. The aortic valve is calcified. There  is severe calcifcation of the aortic valve. There is severe thickening of  the aortic valve. Aortic valve  regurgitation is not visualized. Moderate to severe aortic valve stenosis.   6. There  is moderate dilatation of the aortic root, measuring 42 mm.   7. The inferior vena cava is normal in size with greater than 50%  respiratory variability, suggesting right atrial pressure of 3 mmHg.   FINDINGS   Left Ventricle: Left ventricular ejection fraction, by estimation, is 60  to 65%. The left ventricle has normal function. The left ventricle has no  regional wall motion abnormalities. The left ventricular internal cavity  size was normal in size. There is   moderate left ventricular hypertrophy. Left ventricular diastolic  parameters were normal.   Right Ventricle: The right ventricular size is normal. No increase in  right ventricular wall thickness. Right ventricular systolic function is  normal.   Left Atrium: Left atrial size was mildly dilated.   Right Atrium: Right atrial size was normal in size.   Pericardium: There is no evidence of pericardial effusion.   Mitral Valve: The mitral valve is abnormal. There is mild thickening of  the mitral valve leaflet(s). There is mild calcification of the mitral  valve leaflet(s). Moderate mitral annular calcification. Trivial mitral  valve regurgitation. No evidence of  mitral valve stenosis.   Tricuspid Valve: The tricuspid valve is normal in structure. Tricuspid  valve regurgitation is not demonstrated. No evidence of tricuspid  stenosis.   Aortic Valve: Gradients lower than TTE done 06/18/20 DVI lower prevoiusly  0.31 AVA similar Fused right and left cusps. The aortic valve is  calcified. There is severe calcifcation of the aortic valve. There is  severe thickening of the aortic valve. Aortic  valve regurgitation is not visualized. Moderate to severe aortic stenosis  is present. Aortic valve mean gradient measures 31.0 mmHg. Aortic valve  peak gradient measures 53.9 mmHg. Aortic valve area, by VTI measures 0.82  cm.   Pulmonic Valve: The  pulmonic valve was normal in structure. Pulmonic valve  regurgitation is not  visualized. No evidence of pulmonic stenosis.   Aorta: The ascending aorta was not well visualized. There is moderate  dilatation of the aortic root, measuring 42 mm.   Venous: The inferior vena cava is normal in size with greater than 50%  respiratory variability, suggesting right atrial pressure of 3 mmHg.   IAS/Shunts: No atrial level shunt detected by color flow Doppler.      LEFT VENTRICLE  PLAX 2D  LVIDd:         4.70 cm  Diastology  LVIDs:         3.30 cm  LV e' medial:    5.77 cm/s  LV PW:         1.10 cm  LV E/e' medial:  16.6  LV IVS:        1.60 cm  LV e' lateral:   9.57 cm/s  LVOT diam:     2.20 cm  LV E/e' lateral: 10.0  LV SV:         67  LV SV Index:   32  LVOT Area:     3.80 cm      LEFT ATRIUM             Index       RIGHT ATRIUM           Index  LA diam:        4.00 cm 1.90 cm/m  RA Pressure: 3.00 mmHg  LA Vol (A2C):   67.1 ml 31.88 ml/m RA Area:     19.30 cm  LA Vol (A4C):   48.4 ml 22.99 ml/m RA Volume:   58.70 ml  27.89 ml/m  LA Biplane Vol: 62.3 ml 29.60 ml/m   AORTIC VALVE  AV Area (Vmax):    0.81 cm  AV Area (Vmean):   0.80 cm  AV Area (VTI):     0.82 cm  AV Vmax:           367.00 cm/s  AV Vmean:          259.500 cm/s  AV VTI:            0.822 m  AV Peak Grad:      53.9 mmHg  AV Mean Grad:      31.0 mmHg  LVOT Vmax:         78.20 cm/s  LVOT Vmean:        54.600 cm/s  LVOT VTI:          0.177 m  LVOT/AV VTI ratio: 0.22     AORTA  Ao Root diam: 4.20 cm  Ao Asc diam:  3.80 cm   MITRAL VALVE                TRICUSPID VALVE  MV Area (PHT): 2.24 cm     Estimated RAP:  3.00 mmHg  MV Decel Time: 338 msec  MV E velocity: 95.90 cm/s   SHUNTS  MV A velocity: 113.00 cm/s  Systemic VTI:  0.18 m  MV E/A ratio:  0.85         Systemic Diam: 2.20 cm   Recent Labs: 11/18/2020: ALT 71; BUN 18; Creatinine, Ser 1.06; Hemoglobin 14.6; Platelets 224.0; Potassium 4.8; Sodium 137; TSH 2.07    Wt Readings from Last 3 Encounters:  12/16/20 208 lb (94.3  kg)  11/18/20 209 lb (94.8 kg)  05/20/20 213 lb (96.6 kg)  Other studies Reviewed: Additional studies/ records that were reviewed today include: Echo images, EKG, office notes Review of the above records demonstrates: severe bicuspid aortic stenosis   STS Risk Score: Risk of Mortality: 1.015% Renal Failure: 1.730% Permanent Stroke: 0.823% Prolonged Ventilation: 3.153% DSW Infection: 0.129% Reoperation: 3.198% Morbidity or Mortality: 7.052% Short Length of Stay: 59.916% Long Length of Stay: 2.319%  Assessment and Plan:   1. Severe Aortic Valve Stenosis: He has severe, stage D aortic valve stenosis. I have personally reviewed the echo images. The aortic valve is thickened, calcified with limited leaflet mobility. The aortic valve is likely bicuspid. I think he would benefit from AVR. Given his cirrhosis and liver transplant, he may not be a good candidate for conventional AVR by surgical approach. I do not think his advanced liver disease is captured adequately in his STS risk score. I think he should be a good candidate for TAVR. Following testing, will have him seen  in the CT surgery office to review surgical risk.   I have reviewed the natural history of aortic stenosis with the patient and their family members  who are present today. We have discussed the limitations of medical therapy and the poor prognosis associated with symptomatic aortic stenosis. We have reviewed potential treatment options, including palliative medical therapy, conventional surgical aortic valve replacement, and transcatheter aortic valve replacement. We discussed treatment options in the context of the patient's specific comorbid medical conditions.   He would like to proceed with planning for TAVR. I will arrange a right and left heart catheterization at [facility] 12/20/20 at 10:30am. Risks and benefits of the cath procedure and the valve procedure are reviewed with the patient. After the cath, he will  have a cardiac CT, CTA of the chest/abdomen and pelvis and PT assessment and will then be referred to see one of the CT surgeons on our TAVR team.      Current medicines are reviewed at length with the patient today.  The patient does not have concerns regarding medicines.  The following changes have been made:  no change  Labs/ tests ordered today include:   Orders Placed This Encounter  Procedures   EKG 12-Lead      Disposition:   F/U with the valve team.    Signed, Lauree Chandler, MD 12/16/2020 10:47 AM    [facility] [address], [place], [place]  [address] Phone: [phone]; Fax: [phone]

## 2020-12-18 ENCOUNTER — Telehealth: Payer: Self-pay | Admitting: *Deleted

## 2020-12-18 NOTE — Telephone Encounter (Signed)
Patient scheduled sleep consult 12/27/20 with Dr. Halford Chessman.  Patient sleep study 11/09/11. Patient LOV 06/03/16. Spoke with Patient. Patient bringing SD card to OV.

## 2020-12-19 ENCOUNTER — Telehealth: Payer: Self-pay | Admitting: *Deleted

## 2020-12-19 NOTE — Telephone Encounter (Addendum)
Cardiac catheterization scheduled at [facility] for: Friday December 20, 2020 10:30 AM [facility] Main Entrance A [facility]) at: 8:30 AM   No solid food after midnight prior to cath, clear liquids until 5 AM day of procedure.  Medication instructions: Hold: Metformin-day of procedure and 48 hours post procedure  Except hold medication usual morning medications can be taken pre-cath with sips of water including aspirin 81 mg.    Confirmed patient has responsible adult to drive home post procedure and be with patient first 24 hours after arriving home.  [facility] does allow one visitor to accompany you and wait in the hospital waiting room while you are there for your procedure. You and your visitor will be asked to wear a mask once you enter the hospital.   Patient reports does not currently have any new symptoms concerning for COVID-19 and no household members with COVID-19 like illness.   Reviewed procedure/mask/visitor instructions with patient.

## 2020-12-19 NOTE — Telephone Encounter (Signed)
Care Everywhere 12/13/20 CBC/Chem.

## 2020-12-20 ENCOUNTER — Ambulatory Visit (HOSPITAL_COMMUNITY)
Admission: RE | Admit: 2020-12-20 | Discharge: 2020-12-20 | Disposition: A | Discharge status: Home / Self Care | Payer: Medicare Other | Attending: Cardiovascular Disease | Admitting: Cardiovascular Disease

## 2020-12-20 ENCOUNTER — Other Ambulatory Visit: Payer: Self-pay

## 2020-12-20 ENCOUNTER — Encounter (HOSPITAL_COMMUNITY)
Admission: RE | Disposition: A | Discharge status: Home / Self Care | Payer: Self-pay | Source: Home / Self Care | Attending: Cardiovascular Disease

## 2020-12-20 DIAGNOSIS — I2510 Atherosclerotic heart disease of native coronary artery without angina pectoris: Secondary | ICD-10-CM | POA: Diagnosis not present

## 2020-12-20 DIAGNOSIS — K7460 Unspecified cirrhosis of liver: Secondary | ICD-10-CM | POA: Diagnosis not present

## 2020-12-20 DIAGNOSIS — G4733 Obstructive sleep apnea (adult) (pediatric): Secondary | ICD-10-CM | POA: Insufficient documentation

## 2020-12-20 DIAGNOSIS — I350 Nonrheumatic aortic (valve) stenosis: Secondary | ICD-10-CM | POA: Diagnosis not present

## 2020-12-20 DIAGNOSIS — E119 Type 2 diabetes mellitus without complications: Secondary | ICD-10-CM | POA: Insufficient documentation

## 2020-12-20 DIAGNOSIS — Z7984 Long term (current) use of oral hypoglycemic drugs: Secondary | ICD-10-CM | POA: Insufficient documentation

## 2020-12-20 DIAGNOSIS — Z8249 Family history of ischemic heart disease and other diseases of the circulatory system: Secondary | ICD-10-CM | POA: Diagnosis not present

## 2020-12-20 DIAGNOSIS — Z944 Liver transplant status: Secondary | ICD-10-CM | POA: Diagnosis not present

## 2020-12-20 DIAGNOSIS — Z7982 Long term (current) use of aspirin: Secondary | ICD-10-CM | POA: Insufficient documentation

## 2020-12-20 DIAGNOSIS — Z79899 Other long term (current) drug therapy: Secondary | ICD-10-CM | POA: Diagnosis not present

## 2020-12-20 DIAGNOSIS — I10 Essential (primary) hypertension: Secondary | ICD-10-CM | POA: Diagnosis not present

## 2020-12-20 DIAGNOSIS — E079 Disorder of thyroid, unspecified: Secondary | ICD-10-CM | POA: Diagnosis not present

## 2020-12-20 HISTORY — PX: RIGHT/LEFT HEART CATH AND CORONARY ANGIOGRAPHY: CATH118266

## 2020-12-20 LAB — POCT I-STAT 7, (LYTES, BLD GAS, ICA,H+H)
Acid-base deficit: 4 mmol/L — ABNORMAL HIGH (ref 0.0–2.0)
Bicarbonate: 21.9 mmol/L (ref 20.0–28.0)
Calcium, Ion: 1.14 mmol/L — ABNORMAL LOW (ref 1.15–1.40)
HCT: 40 % (ref 39.0–52.0)
Hemoglobin: 13.6 g/dL (ref 13.0–17.0)
O2 Saturation: 98 %
Potassium: 4.4 mmol/L (ref 3.5–5.1)
Sodium: 141 mmol/L (ref 135–145)
TCO2: 23 mmol/L (ref 22–32)
pCO2 arterial: 41.9 mmHg (ref 32.0–48.0)
pH, Arterial: 7.326 — ABNORMAL LOW (ref 7.350–7.450)
pO2, Arterial: 107 mmHg (ref 83.0–108.0)

## 2020-12-20 LAB — POCT I-STAT EG7
Acid-base deficit: 2 mmol/L (ref 0.0–2.0)
Bicarbonate: 25 mmol/L (ref 20.0–28.0)
Calcium, Ion: 1.24 mmol/L (ref 1.15–1.40)
HCT: 41 % (ref 39.0–52.0)
Hemoglobin: 13.9 g/dL (ref 13.0–17.0)
O2 Saturation: 72 %
Potassium: 4.6 mmol/L (ref 3.5–5.1)
Sodium: 139 mmol/L (ref 135–145)
TCO2: 26 mmol/L (ref 22–32)
pCO2, Ven: 48.4 mmHg (ref 44.0–60.0)
pH, Ven: 7.321 (ref 7.250–7.430)
pO2, Ven: 41 mmHg (ref 32.0–45.0)

## 2020-12-20 LAB — GLUCOSE, CAPILLARY: Glucose-Capillary: 163 mg/dL — ABNORMAL HIGH (ref 70–99)

## 2020-12-20 SURGERY — RIGHT/LEFT HEART CATH AND CORONARY ANGIOGRAPHY
Anesthesia: LOCAL

## 2020-12-20 MED ORDER — VERAPAMIL HCL 2.5 MG/ML IV SOLN
INTRAVENOUS | Status: AC
  Filled 2020-12-20: qty 2

## 2020-12-20 MED ORDER — ASPIRIN 81 MG PO CHEW: 81.0000 mg | CHEWABLE_TABLET | ORAL | Status: DC

## 2020-12-20 MED ORDER — VERAPAMIL HCL 2.5 MG/ML IV SOLN
INTRAVENOUS | Status: DC | PRN
  Administered 2020-12-20: 10 mL via INTRA_ARTERIAL

## 2020-12-20 MED ORDER — HEPARIN SODIUM (PORCINE) 1000 UNIT/ML IJ SOLN
INTRAMUSCULAR | Status: DC | PRN
  Administered 2020-12-20: 4000 [IU] via INTRAVENOUS

## 2020-12-20 MED ORDER — HEPARIN SODIUM (PORCINE) 1000 UNIT/ML IJ SOLN
INTRAMUSCULAR | Status: AC
  Filled 2020-12-20: qty 1

## 2020-12-20 MED ORDER — SODIUM CHLORIDE 0.9 % IV SOLN: 250.0000 mL | INTRAVENOUS | Status: DC | PRN

## 2020-12-20 MED ORDER — MIDAZOLAM HCL 2 MG/2ML IJ SOLN
INTRAMUSCULAR | Status: DC | PRN
  Administered 2020-12-20: 2 mg via INTRAVENOUS

## 2020-12-20 MED ORDER — MIDAZOLAM HCL 2 MG/2ML IJ SOLN
INTRAMUSCULAR | Status: AC
  Filled 2020-12-20: qty 2

## 2020-12-20 MED ORDER — SODIUM CHLORIDE 0.9% FLUSH: 3.0000 mL | Freq: Two times a day (BID) | INTRAVENOUS | Status: DC

## 2020-12-20 MED ORDER — SODIUM CHLORIDE 0.9% FLUSH: 3.0000 mL | INTRAVENOUS | Status: DC | PRN

## 2020-12-20 MED ORDER — LIDOCAINE HCL (PF) 1 % IJ SOLN
INTRAMUSCULAR | Status: DC | PRN
  Administered 2020-12-20: 4 mL

## 2020-12-20 MED ORDER — FENTANYL CITRATE (PF) 100 MCG/2ML IJ SOLN
INTRAMUSCULAR | Status: DC | PRN
  Administered 2020-12-20: 50 ug via INTRAVENOUS

## 2020-12-20 MED ORDER — FENTANYL CITRATE (PF) 100 MCG/2ML IJ SOLN
INTRAMUSCULAR | Status: AC
  Filled 2020-12-20: qty 2

## 2020-12-20 MED ORDER — IOHEXOL 350 MG/ML SOLN
INTRAVENOUS | Status: DC | PRN
  Administered 2020-12-20: 45 mL

## 2020-12-20 MED ORDER — SODIUM CHLORIDE 0.9 % WEIGHT BASED INFUSION
3.0000 mL/kg/h | INTRAVENOUS | Status: AC
  Administered 2020-12-20: 3 mL/kg/h via INTRAVENOUS

## 2020-12-20 MED ORDER — HEPARIN (PORCINE) IN NACL 1000-0.9 UT/500ML-% IV SOLN
INTRAVENOUS | Status: AC
  Filled 2020-12-20: qty 1000

## 2020-12-20 MED ORDER — LIDOCAINE HCL (PF) 1 % IJ SOLN
INTRAMUSCULAR | Status: AC
  Filled 2020-12-20: qty 30

## 2020-12-20 MED ORDER — HEPARIN (PORCINE) IN NACL 1000-0.9 UT/500ML-% IV SOLN
INTRAVENOUS | Status: DC | PRN
  Administered 2020-12-20 (×2): 500 mL

## 2020-12-20 MED ORDER — SODIUM CHLORIDE 0.9 % WEIGHT BASED INFUSION
1.0000 mL/kg/h | INTRAVENOUS | Status: DC
  Administered 2020-12-20: 1 mL/kg/h via INTRAVENOUS

## 2020-12-20 SURGICAL SUPPLY — 13 items
CATH 5FR JL3.5 JR4 ANG PIG MP (CATHETERS) ×2 IMPLANT
CATH BALLN WEDGE 5F 110CM (CATHETERS) ×2 IMPLANT
DEVICE RAD COMP TR BAND LRG (VASCULAR PRODUCTS) ×2 IMPLANT
GLIDESHEATH SLEND SS 6F .021 (SHEATH) ×2 IMPLANT
GUIDEWIRE .025 260CM (WIRE) ×2 IMPLANT
GUIDEWIRE INQWIRE 1.5J.035X260 (WIRE) ×1 IMPLANT
INQWIRE 1.5J .035X260CM (WIRE) ×2
KIT HEART LEFT (KITS) ×2 IMPLANT
PACK CARDIAC CATHETERIZATION (CUSTOM PROCEDURE TRAY) ×2 IMPLANT
SHEATH GLIDE SLENDER 4/5FR (SHEATH) ×2 IMPLANT
SYR MEDRAD MARK 7 150ML (SYRINGE) ×2 IMPLANT
TRANSDUCER W/STOPCOCK (MISCELLANEOUS) ×2 IMPLANT
TUBING CIL FLEX 10 FLL-RA (TUBING) ×2 IMPLANT

## 2020-12-20 NOTE — Progress Notes (Signed)
Discharge instructions reviewed with pt and his ex wife both voice understanding.

## 2020-12-20 NOTE — Interval H&P Note (Signed)
History and Physical Interval Note:  12/20/2020 9:27 AM  Danelle Berry  has presented today for surgery, with the diagnosis of aortic stenosis.  The various methods of treatment have been discussed with the patient and family. After consideration of risks, benefits and other options for treatment, the patient has consented to  Procedure(s): RIGHT/LEFT HEART CATH AND CORONARY ANGIOGRAPHY (N/A) as a surgical intervention.  The patient's history has been reviewed, patient examined, no change in status, stable for surgery.  I have reviewed the patient's chart and labs.  Questions were answered to the patient's satisfaction.    Cath Lab Visit (complete for each Cath Lab visit)  Clinical Evaluation Leading to the Procedure:   ACS: No.  Non-ACS:    Anginal Classification: CCS II  Anti-ischemic medical therapy: Minimal Therapy (1 class of medications)  Non-Invasive Test Results: No non-invasive testing performed  Prior CABG: No previous CABG        Lauree Chandler

## 2020-12-20 NOTE — Discharge Instructions (Addendum)
Hold metformin for 48 hours post cath. Resume on Monday.   Radial Site Care  This sheet gives you information about how to care for yourself after your procedure. Your health care provider may also give you more specific instructions. If you have problems or questions, contact your health care provider. What can I expect after the procedure? After the procedure, it is common to have: Bruising and tenderness at the catheter insertion area. Follow these instructions at home: Medicines Take over-the-counter and prescription medicines only as told by your health care provider. Insertion site care Follow instructions from your health care provider about how to take care of your insertion site. Make sure you: Wash your hands with soap and water before you remove your bandage (dressing). If soap and water are not available, use hand sanitizer. May remove dressing in 24 hours. Check your insertion site every day for signs of infection. Check for: Redness, swelling, or pain. Fluid or blood. Pus or a bad smell. Warmth. Do no take baths, swim, or use a hot tub for 5 days. You may shower 24-48 hours after the procedure. Remove the dressing and gently wash the site with plain soap and water. Pat the area dry with a clean towel. Do not rub the site. That could cause bleeding. Do not apply powder or lotion to the site. Activity  For 24 hours after the procedure, or as directed by your health care provider: Do not flex or bend the affected arm. Do not push or pull heavy objects with the affected arm. Do not drive yourself home from the hospital or clinic. You may drive 24 hours after the procedure. Do not operate machinery or power tools. KEEP ARM ELEVATED THE REMAINDER OF THE DAY. Do not push, pull or lift anything that is heavier than 10 lb for 5 days. Ask your health care provider when it is okay to: Return to work or school. Resume usual physical activities or sports. Resume sexual  activity. General instructions If the catheter site starts to bleed, raise your arm and put firm pressure on the site. If the bleeding does not stop, get help right away. This is a medical emergency. DRINK PLENTY OF FLUIDS FOR THE NEXT 2-3 DAYS. No alcohol consumption for 24 hours after receiving sedation. If you went home on the same day as your procedure, a responsible adult should be with you for the first 24 hours after you arrive home. Keep all follow-up visits as told by your health care provider. This is important. Contact a health care provider if: You have a fever. You have redness, swelling, or yellow drainage around your insertion site. Get help right away if: You have unusual pain at the radial site. The catheter insertion area swells very fast. The insertion area is bleeding, and the bleeding does not stop when you hold steady pressure on the area. Your arm or hand becomes pale, cool, tingly, or numb. These symptoms may represent a serious problem that is an emergency. Do not wait to see if the symptoms will go away. Get medical help right away. Call your local emergency services (911 in the [place] Do not drive yourself to the hospital. Summary After the procedure, it is common to have bruising and tenderness at the site. Follow instructions from your health care provider about how to take care of your radial site wound. Check the wound every day for signs of infection.  This information is not intended to replace advice given to you by your  health care provider. Make sure you discuss any questions you have with your health care provider. Document Revised: 03/17/2017 Document Reviewed: 03/17/2017 Elsevier Patient Education  2020 [employer].

## 2020-12-23 ENCOUNTER — Encounter (HOSPITAL_COMMUNITY): Payer: Self-pay | Admitting: Cardiovascular Disease

## 2020-12-23 ENCOUNTER — Other Ambulatory Visit: Payer: Self-pay | Admitting: Cardiology

## 2020-12-23 DIAGNOSIS — I350 Nonrheumatic aortic (valve) stenosis: Secondary | ICD-10-CM

## 2020-12-24 ENCOUNTER — Encounter: Payer: Self-pay | Admitting: Cardiology

## 2020-12-24 MED ORDER — ALENDRONATE 70 MG TABLET
ORAL_TABLET | ORAL | 11 refills | 28.00000 days | Status: CN
Start: 2020-12-24 — End: 2021-12-24

## 2020-12-24 NOTE — Unmapped (Signed)
Pt request for RX Refill alendronate (FOSAMAX) 70 MG tablet

## 2020-12-24 NOTE — Unmapped (Signed)
[facility] Refill Coordination Note    Specialty Medication(s) to be Shipped:   Transplant: tacrolimus 1mg     Other medication(s) to be shipped:     Fosamax  amlodipine  metformin  Pravastatin  sertraline       Anthony Alvarado, [name]: 05/18/1960  Phone: [phone] (home)       All above HIPAA information was verified with patient.     Was a Nurse, learning disability used for this call? No    Completed refill call assessment today to schedule patient's medication shipment from the [facility] [phone]).  All relevant notes have been reviewed.     Specialty medication(s) and dose(s) confirmed: Regimen is correct and unchanged.   Changes to medications: Anthony Alvarado reports no changes at this time.  Changes to insurance: No  New side effects reported not previously addressed with a pharmacist or physician: None reported  Questions for the pharmacist: No    Confirmed patient received a Conservation officer, historic buildings and a Surveyor, mining with first shipment. The patient will receive a drug information handout for each medication shipped and additional FDA Medication Guides as required.       DISEASE/MEDICATION-SPECIFIC INFORMATION        N/A    SPECIALTY MEDICATION ADHERENCE     Medication Adherence    Patient reported X missed doses in the last month: 0  Specialty Medication: tacrolimus (PROGRAF) 1 MG capsule  Patient is on additional specialty medications: No      tacrolimus 1mg   8 days worth of medication on hand.             Were doses missed due to medication being on hold? No      REFERRAL TO PHARMACIST     Referral to the pharmacist: Not needed      [facility]     Shipping address confirmed in Epic.     Delivery Scheduled: Yes, Expected medication delivery date: 12/26/20.     Medication will be delivered via UPS to the prescription address in Epic WAM.    Anthony Alvarado   [facility] Specialty Technician

## 2020-12-25 DIAGNOSIS — M810 Age-related osteoporosis without current pathological fracture: Principal | ICD-10-CM

## 2020-12-25 MED ORDER — ALENDRONATE 70 MG TABLET
ORAL_TABLET | ORAL | 2 refills | 28 days | Status: CP
Start: 2020-12-25 — End: 2021-03-25
  Filled 2020-12-25: qty 4, 28d supply, fill #0

## 2020-12-25 MED FILL — METFORMIN ER 500 MG TABLET,EXTENDED RELEASE 24 HR: ORAL | 90 days supply | Qty: 360 | Fill #1

## 2020-12-25 MED FILL — TACROLIMUS 1 MG CAPSULE, IMMEDIATE-RELEASE: ORAL | 30 days supply | Qty: 120 | Fill #5

## 2020-12-25 MED FILL — PRAVASTATIN 40 MG TABLET: ORAL | 90 days supply | Qty: 90 | Fill #2

## 2020-12-25 MED FILL — AMLODIPINE 10 MG TABLET: ORAL | 90 days supply | Qty: 90 | Fill #0

## 2020-12-25 MED FILL — SERTRALINE 50 MG TABLET: ORAL | 90 days supply | Qty: 90 | Fill #1

## 2020-12-25 NOTE — Unmapped (Signed)
Received refill request for patient's alendronate. Three month refill approved by PharmD [employer] with plan for patient to reach out to pcp for refills. Spoke with patient and explained that his pcp would need to manage future refills on this medication along with his bone health. Inquired how he was doing with dietary changes. He said that he needs to make more improvements and would like to speak with the dietician. Let him know that he would receive appt notification via MyChart. He verbalized understanding.

## 2020-12-27 ENCOUNTER — Other Ambulatory Visit: Payer: Self-pay

## 2020-12-27 ENCOUNTER — Encounter: Payer: Self-pay | Admitting: Pulmonary Disease

## 2020-12-27 ENCOUNTER — Ambulatory Visit: Payer: Medicare Other | Admitting: Pulmonary Disease

## 2020-12-27 VITALS — BP 126/72 | HR 72 | Ht 69.0 in | Wt 206.8 lb

## 2020-12-27 DIAGNOSIS — Z9989 Dependence on other enabling machines and devices: Secondary | ICD-10-CM | POA: Diagnosis not present

## 2020-12-27 DIAGNOSIS — G4733 Obstructive sleep apnea (adult) (pediatric): Secondary | ICD-10-CM | POA: Diagnosis not present

## 2020-12-27 DIAGNOSIS — R0609 Other forms of dyspnea: Secondary | ICD-10-CM

## 2020-12-27 NOTE — Patient Instructions (Signed)
Will have [employer] arrange for new CPAP mask and CPAP mask refitting  Follow up in 6 months

## 2020-12-27 NOTE — Progress Notes (Signed)
[name]  Chief Complaint  Patient presents with   Establish Care    Former [facility] patient for OSA. States he has been using his cpap machine, provided SD card today. Denies any new concerns.     Past Surgical History:  He  has a past surgical history that includes Cardiovascular stress test (03/07); doppler echocardiography (06/04); Biopsy thyroid (08/19/07); Appendectomy (10/09); Esophagogastroduodenoscopy (egd) with propofol (N/A, 05/04/2016); esophageal banding (N/A, 05/04/2016); Cardiac catheterization (03/21/2018); Liver transplant; and RIGHT/LEFT HEART CATH AND CORONARY ANGIOGRAPHY (N/A, 12/20/2020).  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 male with obstructive sleep apnea.      Subjective:   I last saw him in 2018.  He still has S9 device.  Uses nightly.  Has full face mask.  Gets mask leak.  Feels pressure too high at times.  Had liver transplant in 2020.  Had pleural effusion and pneumothorax after surgery.  Found to have progression of AS.  Setting up TAVR.  Physical Exam:   Appearance - well kempt   ENMT - no sinus tenderness, no oral exudate, no LAN, Mallampati 4 airway, no stridor  Respiratory - equal breath sounds bilaterally, no wheezing or rales  CV - s1s2 regular rate and rhythm, 3/6 SM  Ext - no clubbing, no edema  Skin - no rashes  Psych - normal mood and affect   Pulmonary testing:    Chest Imaging:  CT chest 09/17/20 >> lungs clear  Sleep Tests:  PSG 11/08/11>>AHI 31.6, SpO2 low 85%. Auto CPAP 09/28/20 to 12/26/20 >> used on 90 of 90 nights with average 8 hrs 46 min.  Average AHI 0.9 with median CPAP 8  and 95 th percentile CPAP 11 cm H2O  Cardiac Tests:  Echo 11/25/20 >> EF 60 to 65%, mod LVH, mod/severe AS  Social History:  He  reports that he has never smoked. He has never used smokeless tobacco. He reports that he does not drink alcohol and does not use drugs.  Family History:  His family history includes Asthma in his mother; COPD in his father; Cancer in his father; Colon cancer in his maternal grandmother; Heart disease in his maternal grandfather and sister; Liver disease in his sister; Prostate cancer in his maternal uncle.     Assessment/Plan:   Obstructive sleep apnea. - he is compliant with CPAP and reports benefit from therapy - uses [employer] for his DME - his current device is more than 58 yrs old - will arrange for new auto CPAP 5 to 10 cm H2O - will arrange for mask refitting  Dyspnea on exertion. - likely from aortic stenosis - he is being scheduled for TAVR - will reassess after procedure  Cryptogenic cirrhosis with hepatocellular carcinoma s/p liver transplant. - [facility]  Time Spent Involved in Patient Care on Day of Examination:  33 minutes  Follow up:   Patient Instructions  Will have [employer] arrange for new CPAP mask and CPAP mask refitting  Follow up in 6 months  Medication List:   Allergies as of 12/27/2020       Reactions  Watermelon Flavor    Mouth itching        Medication List        Accurate as of December 27, 2020 11:14 AM. If you have any questions, ask your nurse or doctor.          acetaminophen 500 MG tablet Commonly known as: TYLENOL Take 500 mg by mouth every 6 (six) hours as needed for moderate pain or headache.   albuterol 108 (90 Base) MCG/ACT inhaler Commonly known as: VENTOLIN HFA Inhale 2 puffs into the lungs every 6 (six) hours as needed for wheezing or shortness of breath.   alendronate 70 MG tablet Commonly known as: FOSAMAX Take 70 mg by mouth every Sunday. Take with a full glass of  water on an empty stomach.   amLODipine 10 MG tablet Commonly known as: NORVASC Take 1 tablet (10 mg total) by mouth in the morning.   aspirin EC 81 MG tablet Take 81 mg by mouth daily.   azelastine 0.1 % nasal spray Commonly known as: ASTELIN PLACE 2 SPRAYS INTO BOTH NOSTRILS TWICE DAILY AS NEEDED   calcium carbonate 500 MG chewable tablet Commonly known as: TUMS - dosed in mg elemental calcium Chew 1 tablet by mouth daily.   fluticasone 50 MCG/ACT nasal spray Commonly known as: FLONASE PLACE ONE OR TWO SPRAYS INTO BOTH NOSTRILS DAILY AS NEEDED.   levothyroxine 150 MCG tablet Commonly known as: SYNTHROID Take 1 tablet (150 mcg total) by mouth daily before breakfast.   metFORMIN 500 MG 24 hr tablet Commonly known as: GLUCOPHAGE-XR Take 2 tablets (1,000 mg total) by mouth in the morning and at bedtime.   metroNIDAZOLE 1 % gel Commonly known as: METROGEL Apply 1 application topically daily as needed (rosacea).   mycophenolate 250 MG capsule Commonly known as: CELLCEPT Take 250 mg by mouth 2 (two) times daily.   OneTouch Delica Lancets 84T Misc USE TO CHECK SUGAR DAILY   OneTouch Ultra test strip Generic drug: glucose blood USE TO CHECK SUGAR DAILY   pravastatin 40 MG tablet Commonly known as: PRAVACHOL Take 40 mg by mouth every evening.   sertraline 50 MG tablet Commonly known as: ZOLOFT Take 1 tablet (50 mg total) by mouth daily.   tacrolimus 1 MG capsule Commonly known as: PROGRAF Take 2 mg by mouth 2 (two) times daily.   Vitamin D 50 MCG (2000 UT) tablet Take 2,000 Units by mouth daily.        Signature:  Chesley Mires, MD [place] Pager - [phone] 12/27/2020, 11:14 AM

## 2021-01-01 ENCOUNTER — Other Ambulatory Visit: Payer: Self-pay | Admitting: Cardiology

## 2021-01-01 ENCOUNTER — Ambulatory Visit (HOSPITAL_COMMUNITY)
Admission: RE | Admit: 2021-01-01 | Discharge: 2021-01-01 | Disposition: A | Discharge status: Home / Self Care | Payer: Medicare Other | Source: Ambulatory Visit | Attending: Cardiology | Admitting: Cardiology

## 2021-01-01 ENCOUNTER — Other Ambulatory Visit: Payer: Self-pay

## 2021-01-01 DIAGNOSIS — Z01818 Encounter for other preprocedural examination: Secondary | ICD-10-CM | POA: Diagnosis not present

## 2021-01-01 DIAGNOSIS — I350 Nonrheumatic aortic (valve) stenosis: Secondary | ICD-10-CM

## 2021-01-01 DIAGNOSIS — I358 Other nonrheumatic aortic valve disorders: Secondary | ICD-10-CM | POA: Diagnosis not present

## 2021-01-01 DIAGNOSIS — R079 Chest pain, unspecified: Secondary | ICD-10-CM

## 2021-01-01 DIAGNOSIS — I700 Atherosclerosis of aorta: Secondary | ICD-10-CM | POA: Diagnosis not present

## 2021-01-01 DIAGNOSIS — D1800 Hemangioma unspecified site: Secondary | ICD-10-CM | POA: Diagnosis not present

## 2021-01-01 DIAGNOSIS — K7460 Unspecified cirrhosis of liver: Secondary | ICD-10-CM | POA: Diagnosis not present

## 2021-01-01 DIAGNOSIS — I2699 Other pulmonary embolism without acute cor pulmonale: Secondary | ICD-10-CM | POA: Diagnosis not present

## 2021-01-01 MED ORDER — APIXABAN 5 MG PO TABS: 5.0000 mg | ORAL_TABLET | Freq: Two times a day (BID) | ORAL | 3 refills | Status: DC

## 2021-01-01 MED ORDER — IOHEXOL 350 MG/ML SOLN
100.0000 mL | Freq: Once | INTRAVENOUS | Status: AC | PRN
  Administered 2021-01-01: 100 mL via INTRAVENOUS

## 2021-01-01 NOTE — Progress Notes (Signed)
/   [facility]   MULTIDISCIPLINARY HEART VALVE TEAM   Pt currently undergoing TAVR workup. Had pre-operative CT imagining today which showed multiple nonobstructive pulmonary emboli in the pulmonary arterial tree bilaterally, as above. Additional emboli are suspected, but the quality of pulmonary artery opacification on examination, which was arterially timed, is sub optimal. Further evaluation with dedicated PE protocol CT scan is recommended at this time.  Patient scheduled for urgent OP pulmonary embolism CT imaging tomorrow at [facility]. Patient also started on Eliquis mg PO BID. Patient contacted, understands and agrees to plan as described above.   Will set up follow up appointment next week with myself. Last Hb and creatinine from 11/18/20 were stable.  Kathyrn Drown NP-C Structural Heart Team  Pager: [phone]

## 2021-01-02 ENCOUNTER — Encounter (HOSPITAL_COMMUNITY): Payer: Self-pay

## 2021-01-02 ENCOUNTER — Ambulatory Visit (HOSPITAL_COMMUNITY)
Admission: RE | Admit: 2021-01-02 | Discharge: 2021-01-02 | Disposition: A | Discharge status: Home / Self Care | Payer: Medicare Other | Source: Ambulatory Visit | Attending: Cardiology | Admitting: Cardiology

## 2021-01-02 DIAGNOSIS — I358 Other nonrheumatic aortic valve disorders: Secondary | ICD-10-CM | POA: Diagnosis not present

## 2021-01-02 DIAGNOSIS — I700 Atherosclerosis of aorta: Secondary | ICD-10-CM | POA: Diagnosis not present

## 2021-01-02 DIAGNOSIS — R079 Chest pain, unspecified: Secondary | ICD-10-CM | POA: Insufficient documentation

## 2021-01-02 LAB — POCT I-STAT CREATININE: Creatinine, Ser: 1.2 mg/dL (ref 0.61–1.24)

## 2021-01-02 MED ORDER — IOHEXOL 350 MG/ML SOLN
100.0000 mL | Freq: Once | INTRAVENOUS | Status: AC | PRN
  Administered 2021-01-02: 100 mL via INTRAVENOUS

## 2021-01-02 NOTE — Progress Notes (Incomplete)
CT angio Chest report called to Kathyrn Drown NP 01/02/21 1015am with read back. Glenford Peers NP stated to let the patient go and let him know she will call him.

## 2021-01-03 ENCOUNTER — Telehealth: Admit: 2021-01-03 | Discharge: 2021-01-04 | Payer: MEDICARE | Attending: Nutritionist | Primary: Nutritionist

## 2021-01-03 DIAGNOSIS — Z944 Liver transplant status: Principal | ICD-10-CM

## 2021-01-03 NOTE — Unmapped (Signed)
[facility] Outpatient Nutrition Services   Medical Nutrition Therapy Consultation       Visit Type:    Return Assessment    Referral Reason: :  Liver Transplant 09/16/18      Anthony Alvarado is a 60 y.o. male seen for medical nutrition therapy for elevated triglycerides, hyperglycemia and weight loss nutrition counseling. His active problem list, medication list and allergies were reviewed.     His interim medical history is significant for elevated Hemoglobin A1c, high triglycerides and obesity.    Anthropometrics   Estimated body mass index is 31.19 kg/m?? as calculated from the following:    Height as of 09/17/20: 175.3 cm (5' 9).    Weight as of 09/17/20: 95.8 kg (211 lb 3.2 oz).    Wt Readings from Last 5 Encounters:   09/17/20 95.8 kg (211 lb 3.2 oz)   03/20/20 93.4 kg (206 lb)   09/18/19 93.7 kg (206 lb 8 oz)   09/18/19 93.7 kg (206 lb 8 oz)   06/19/19 90.3 kg (199 lb 1.6 oz)      Usual body weight: 208 lbs per patient in the past year, his goal is to get back to ~185 lbs    Ideal Body Weight:   72.7 kg       Nutrition Risk Screening:     Nutrition Focused Physical Exam:  Unable to complete at this time due to virtual visit     Malnutrition Screening:   Malnutrition assessment not yet completed at this time due to inability to complete nutrition focused physical exam (NFPE).      Biochemical Data, Medical Tests and Procedures:  All pertinent labs and imaging reviewed by Lanelle Bal at 9:43 AM 01/03/2021.    Lab Results   Component Value Date    Hemoglobin A1C 6.6 (H) 09/18/2019    Hemoglobin A1C 6.0 (H) 04/17/2019    Hemoglobin A1C 5.9 (H) 12/19/2018      No results found for: VITAMINA  Lab Results   Component Value Date    Vitamin D Total (25OH) 38.7 09/18/2019    Vitamin D Total (25OH) 34.9 04/17/2019    Vitamin D Total (25OH) 38.9 12/19/2018     No results found for: VITAME  No results found for: CRP  No results found for: ZINC  No results found for: COPPER    Lab Results   Component Value Date BUN 14 12/13/2020    CREATININE 1.08 12/13/2020    GFRAA 92 01/25/2020    GFRNONAA 79 01/25/2020    NA 139 12/13/2020    K 4.8 12/13/2020    CL 103 12/13/2020    CO2 23 12/13/2020    CALCIUM 9.7 12/13/2020    PHOS 2.9 09/17/2020    ALBUMIN 4.2 09/17/2020       Medications and Vitamin/Mineral Supplementation:   All nutritionally pertinent medications reviewed on 01/03/2021.   Nutritionally pertinent medications include: Tums, levothyroxine, metformin  He is not taking nutrition supplements.    Current Outpatient Medications   Medication Sig Dispense Refill   ??? alendronate (FOSAMAX) 70 MG tablet Take 1 tablet (70 mg total) by mouth every seven (7) days. 4 tablet 2   ??? amLODIPine (NORVASC) 10 MG tablet Take 1 tablet (10 mg total) by mouth daily. 30 tablet 11   ??? amLODIPine (NORVASC) 10 MG tablet Take 1 tablet (10 mg total) by mouth every morning. 90 tablet 3   ??? aspirin (ECOTRIN) 81 MG tablet Take 1 tablet (81 mg  total) by mouth daily. 30 tablet 11   ??? azelastine (ASTELIN) 137 mcg (0.1 %) nasal spray 1 spray by Each Nare route daily as needed.      ??? blood sugar diagnostic (ONETOUCH ULTRA TEST) Strp USE TO CHECK SUGAR DAILY     ??? blood sugar diagnostic Strp Use as directed Three (3) times a day before meals. 90 each 11   ??? calcium carbonate (TUMS) 200 mg calcium (500 mg) chewable tablet Chew 1 tablet daily.     ??? clindamycin (CLEOCIN T) 1 % lotion Apply topically to face for acne daily 60 mL 2   ??? clobetasoL (TEMOVATE) 0.05 % ointment Apply topically Two (2) times a day to itchy spots 60 g 1   ??? fluticasone propionate (FLONASE) 50 mcg/actuation nasal spray PLACE ONE OR TWO SPRAYS INTO BOTH NOSTRILS DAILY AS NEEDED.     ??? lancets 33 gauge Misc 1 each by Miscellaneous route Three (3) times a day before meals. 100 each 11   ??? levothyroxine (SYNTHROID) 150 MCG tablet Take 1 tablet (150 mcg total) by mouth daily. 30 tablet 11   ??? metFORMIN (GLUCOPHAGE-XR) 500 MG 24 hr tablet Take 2 tablets (1,000 mg total) by mouth in the morning and at bedtime. 360 tablet 3   ??? mycophenolate (CELLCEPT) 250 mg capsule Take 1 capsule (250 mg total) by mouth Two (2) times a day. 60 capsule 11   ??? pravastatin (PRAVACHOL) 40 MG tablet Take 1 tablet (40 mg total) by mouth every evening. 90 tablet 3   ??? sertraline (ZOLOFT) 50 MG tablet Take 1 tablet (50 mg total) by mouth daily. 90 tablet 3   ??? tacrolimus (PROGRAF) 1 MG capsule Take 2 capsules (2 mg total) by mouth two (2) times a day. 120 capsule 11   ??? testosterone (ANDROGEL) 1 % (25 mg/2.5gram) GlPk APPLY 2.5G (1 PACKET) TO EACH SHOULDER/UPPER ARM DAILY FOR A TOTAL DAILY DOSE OF 5G 150 packet 0   ??? testosterone (ANDROGEL) 1 % (25 mg/2.5gram) GlPk Place on the skin. (Patient not taking: Reported on 11/28/2020)       No current facility-administered medications for this visit.     Facility-Administered Medications Ordered in Other Visits   Medication Dose Route Frequency Provider Last Rate Last Admin   ??? diazePAM (VALIUM) 5 mg/mL injection                Nutrition History:     Dietary Restrictions: He reported food allergies to watermelon.    Gastrointestinal Issues: Denied issues     Hunger and Satiety: Denied issues.     Food Safety and Access: No to little issues noted.     Diet Recall:   Time Intake   Breakfast Bran flakes or bacon/sausage biscuit with hash browns from fast food restaurant    Snack (AM)    Lunch Hamburger with pinto or lima beans    Snack (PM)    Dinner Salads with ranch dressing    Snack (HS)      Food-Related History:  Beverages:  Water, diet soda   Dining Out:  Fast food a few times per week   Usual Food Choices: fried flounder     Physical Activity:  Physical activity level is light with some exercise.    Patient reports not exercising or walking as much he would like. He reports needing an aortic repair sometime in the future so cannot strenuously exercise, but does [place] a recumbent bike occasionally.  Daily Estimated Nutrient Needs:  Energy: 1600- 1815 kcals [22-25 kcal/kg using ideal body weight, 72.7 kg (01/03/21 1516)]  Protein: 60- 75 gm [0.8-1 gm/kg using ideal body weight, 72.7 kg (01/03/21 1516)]  Fluid:   [1 mL/kcal (maintenance)]  Cholesterol: <200 mg    Nutrition Goals & Evaluation      1. Meet estimated daily needs (Met)  2. Normal vitamin levels (New)  3. Balanced macronutrient intake (New)  4. Hemoglobin A1c <7% (New and Met)    Nutrition goals reviewed, and relevant barriers identified and addressed: desire/motivation. He is evaluated to have fair willingness and ability to achieve nutrition goals.     Nutrition Assessment     Per the patient's diet recall, nutrition intake is exceeding his estimated nutrition needs and is high in fat and simple carbohydrates. Discussed importance of changing portion sizes and food selection to improve labs and overall nutrition. Patient currently eats restaurant foods and fried foods daily, and does not consume vegetables or fruits on a regular basis. Patient had several questions about how to help improve taste/flavor of vegetables so he will like them more. Discussed different cooking methods as well as healthy seasonings to help decrease blandness.   Discussed major diet principles to help decrease triglycerides and lose weight. Patient interested in discussion and provided examples of things he could do to help himself eat healthier. Patient is going to be reliant on diet to improve his labs and weight, as his exercise capability is limited. Encouraged patient to make 1 change every 2 weeks to make sure it sticks and he does get overwhelmed with lots of changes at once. Also encouraged him to be more mindful with eating and to slow down whenever possible.      Nutrition Intervention      Nutrition Education: high triglyceride nutrition therapy, diabetes nutrition therapy     Materials Provided were: Handout explaining prescribed diet sent via MyChart     Nutrition Plan:   ??? Adhere to a heart healthy, diabetic meal plan   o Aim for 4 vegetable servings, 3 lean protein servings and 5 whole grain/whole wheat starch servings per day. Limit sweets and fried foods to no more than 1x per week each   o Limit restaurant food, use air fryer or other non-fat cooking options   o Hydrate - drink at least 64 fl oz water per day   ??? Increase intake of non-fried fish/seafood to 2x per week   ??? Monitor portion sizes, stick to 1 serving per food item especially snacks and sauces   ??? Slow down with eating, chew food at least 10 times before swallowing   ??? Increase exercise to 30 minutes, 5 days per week (or as recommended by cardiologist)    Follow up will occur if needed.      Biochemical data, medical tests, procedures will be assessed at time of follow-up.         Recommendations for Care Team:  Monitor triglycerides, glucose and weight           The patient reports they are currently: at home. I spent 39 minutes on the real-time audio and video visit with the patient on the date of service. I spent an additional 32 minutes on pre- and post-visit activities on the date of service.     The patient was not located and I was located within 250 yards of a hospital based location during the real-time audio and video visit. The patient was physically  located in [place] or a state in which I am permitted to provide care. The patient and/or parent/guardian understood that s/he may incur co-pays and cost sharing, and agreed to the telemedicine visit. The visit was reasonable and appropriate under the circumstances given the patient's presentation at the time.    The patient and/or parent/guardian has been advised of the potential risks and limitations of this mode of treatment (including, but not limited to, the absence of in-person examination) and has agreed to be treated using telemedicine. The patient's/patient's family's questions regarding telemedicine have been answered.    If the visit was completed in an ambulatory setting, the patient and/or parent/guardian has also been advised to contact their provider???s office for worsening conditions, and seek emergency medical treatment and/or call 911 if the patient deems either necessary.        Lanelle Bal, RD, LDN  Abdominal Transplant Dietitian   Pager: [phone]

## 2021-01-06 ENCOUNTER — Ambulatory Visit: Payer: Medicare Other | Admitting: Cardiology

## 2021-01-06 ENCOUNTER — Other Ambulatory Visit: Payer: Self-pay

## 2021-01-06 ENCOUNTER — Encounter: Payer: Self-pay | Admitting: Cardiology

## 2021-01-06 VITALS — BP 102/60 | HR 78 | Ht 69.0 in | Wt 206.0 lb

## 2021-01-06 DIAGNOSIS — Z79899 Other long term (current) drug therapy: Secondary | ICD-10-CM

## 2021-01-06 DIAGNOSIS — I10 Essential (primary) hypertension: Secondary | ICD-10-CM

## 2021-01-06 DIAGNOSIS — E785 Hyperlipidemia, unspecified: Secondary | ICD-10-CM

## 2021-01-06 DIAGNOSIS — I2694 Multiple subsegmental pulmonary emboli without acute cor pulmonale: Secondary | ICD-10-CM

## 2021-01-06 DIAGNOSIS — I350 Nonrheumatic aortic (valve) stenosis: Secondary | ICD-10-CM

## 2021-01-06 DIAGNOSIS — Z944 Liver transplant status: Secondary | ICD-10-CM | POA: Diagnosis not present

## 2021-01-06 LAB — CBC
Hematocrit: 42.9 % (ref 37.5–51.0)
Hemoglobin: 14.6 g/dL (ref 13.0–17.7)
MCH: 30.5 pg (ref 26.6–33.0)
MCHC: 34 g/dL (ref 31.5–35.7)
MCV: 90 fL (ref 79–97)
Platelets: 237 10*3/uL (ref 150–450)
RBC: 4.79 x10E6/uL (ref 4.14–5.80)
RDW: 14.1 % (ref 11.6–15.4)
WBC: 10.4 10*3/uL (ref 3.4–10.8)

## 2021-01-06 NOTE — Patient Instructions (Signed)
Medication Instructions:  Your physician recommends that you continue on your current medications as directed. Please refer to the Current Medication list given to you today.  *If you need a refill on your cardiac medications before your next appointment, please call your pharmacy*   Lab Work: TODAY: CBC If you have labs (blood work) drawn today and your tests are completely normal, you will receive your results only by: [place] (if you have MyChart) OR A paper copy in the mail If you have any lab test that is abnormal or we need to change your treatment, we will call you to review the results.   Testing/Procedures: NONE   Follow-Up: At [facility], you and your health needs are our priority.  As part of our continuing mission to provide you with exceptional heart care, we have created designated Provider Care Teams.  These Care Teams include your primary Cardiologist (physician) and Advanced Practice Providers (APPs -  Physician Assistants and Nurse Practitioners) who all work together to provide you with the care you need, when you need it.  We recommend signing up for the patient portal called "MyChart".  Sign up information is provided on this After Visit Summary.  MyChart is used to connect with patients for Virtual Visits (Telemedicine).  Patients are able to view lab/test results, encounter notes, upcoming appointments, etc.  Non-urgent messages can be sent to your provider as well.   To learn more about what you can do with MyChart, go to [email or web address].    Your next appointment:   KEEP SCHEDULED FOLLOW UP WITH DR. BARTLE

## 2021-01-06 NOTE — Progress Notes (Signed)
HEART AND [place]                                     Cardiology Office Note:    Date:  01/06/2021   ID:  [name], DOB 09-01-1960, MRN [id number]  PCP:  [name], MD  [facility] Cardiologist:  Dorris Carnes, MD  [facility] Electrophysiologist:  None   Referring MD: [name], MD   Chief Complaint  Patient presents with   Follow-up   History of Present Illness:    [name] is a 60 y.o. male with a hx of diabetes mellitus, GERD, HTN, liver cirrhosis with prior liver transplant, sleep apnea, mild nonobstructive CAD per cath 2020 and bicuspid aortic with severe aortic stenosis who was referred to Dr. Angelena Form for further evaluation of his aortic stenosis and possible TAVR. He underwent liver transplant at [facility] in 2020 for cirrhosis not related to alcohol abuse. He has been followed for moderate aortic stenosis with likely bicuspid aortic valve. Most recent echocardiogram 11/25/20 showed an  LVEF at 60-65% with moderate LVH, aortic valve leaflets were thickened and calcified with the right and left cusps being fused. Mean gradient was at 31 mm Hg, peak gradient 53.9 mmHg, SVI 32, AVA 0.8 cm2, DVI 0.22, consistent with severe aortic stenosis.   On Dr. Camillia Herter evaluation, he reported progressive dyspnea on exertion. He had persistent dizziness however this was noted to be stable. After further review of his echo imaging, he was felt to be a good candidate for TAVR, mainly due to his liver disease. Plan was to pursue CT imaging and refer to Dr. Cyndia Bent for CT surgery risk.   He underwent CT imaging workup 01/01/21 which showed multiple nonobstructive pulmonary emboli in the pulmonary arterial tree bilaterally, as above. Additional emboli were suspected, but the quality of pulmonary artery opacification on examination, which was arterially timed, was felt to be sub optimal. Further evaluation with dedicated PE protocol CT  scan was recommended. He was subsequently scheduled for an urgent OP pulmonary embolism CT imaging at [facility] that showed no central obstruction. He was started on Eliquis mg PO BID and scheduled for follow up with myself today.   He reports that he has not had any changes in his breathing>>continues to have DOE. Denies chest pain, dizziness, or syncope. We discussed CT findings of hepatic cirrhosis with hypervascular area which hepatocellular carcinoma could not be excluded. He continues to follow closely with [facility] transplant tea. Will contact Dr. Pauline Good and transplant coordinator Jeanne Ivan. He is tolerating Eliquis with no overt s/s of bleeding. Will obtain labs today.   Past Medical History:  Diagnosis Date   Allergy    Asthma    Bicuspid aortic valve    sees dr Harrington Challenger   Cirrhosis [facility])    Diabetes mellitus without complication ([place])    Fatty liver    with h/o elevated LFT's   GERD (gastroesophageal reflux disease)    Heart murmur    Hypertension    Itching    all over last few months   Jaundice    Liver transplant recipient ([place])    09/16/2018 at [facility]   Migraine with aura    OSA (obstructive sleep apnea) 09/14/2011   PSG 11/08/11>>AHI 31.6, SpO2 low 85%. wears CPAP, pt does not know settings   Other abnormal  glucose    diet controlled diabetic   Thyroid disease     Past Surgical History:  Procedure Laterality Date   APPENDECTOMY  10/09   Emergency   BIOPSY THYROID  08/19/07   Attempted, no tissue obtained   CARDIAC CATHETERIZATION  03/21/2018   CARDIOVASCULAR STRESS TEST  03/07   Negative 06/05   DOPPLER ECHOCARDIOGRAPHY  06/04   ESOPHAGEAL BANDING N/A 05/04/2016   Procedure: ESOPHAGEAL BANDING;  Surgeon: [name], MD;  Location: [facility] ENDOSCOPY;  Service: Endoscopy;  Laterality: N/A;   ESOPHAGOGASTRODUODENOSCOPY (EGD) WITH PROPOFOL N/A 05/04/2016   Procedure: ESOPHAGOGASTRODUODENOSCOPY (EGD) WITH PROPOFOL;  Surgeon: [name], MD;  Location: [facility]  ENDOSCOPY;  Service: Endoscopy;  Laterality: N/A;   LIVER TRANSPLANT     09/16/2018 at [facility]   RIGHT/LEFT HEART CATH AND CORONARY ANGIOGRAPHY N/A 12/20/2020   Procedure: RIGHT/LEFT HEART CATH AND CORONARY ANGIOGRAPHY;  Surgeon: [name], MD;  Location: [place] [facility];  Service: Cardiovascular;  Laterality: N/A;   Current Medications: Current Meds  Medication Sig   acetaminophen (TYLENOL) 500 MG tablet Take 500 mg by mouth every 6 (six) hours as needed for moderate pain or headache.   albuterol (VENTOLIN HFA) 108 (90 Base) MCG/ACT inhaler Inhale 2 puffs into the lungs every 6 (six) hours as needed for wheezing or shortness of breath.   alendronate (FOSAMAX) 70 MG tablet Take 70 mg by mouth every Sunday. Take with a full glass of water on an empty stomach.   amLODipine (NORVASC) 10 MG tablet Take 1 tablet (10 mg total) by mouth in the morning.   apixaban (ELIQUIS) 5 MG TABS tablet Take 1 tablet (5 mg total) by mouth 2 (two) times daily.   aspirin EC 81 MG tablet Take 81 mg by mouth daily.   azelastine (ASTELIN) 0.1 % nasal spray PLACE 2 SPRAYS INTO BOTH NOSTRILS TWICE DAILY AS NEEDED   calcium carbonate (TUMS - DOSED IN MG ELEMENTAL CALCIUM) 500 MG chewable tablet Chew 1 tablet by mouth daily.   Cholecalciferol (VITAMIN D) 50 MCG (2000 UT) tablet Take 2,000 Units by mouth daily.   fluticasone (FLONASE) 50 MCG/ACT nasal spray PLACE ONE OR TWO SPRAYS INTO BOTH NOSTRILS DAILY AS NEEDED.   levothyroxine (SYNTHROID) 150 MCG tablet Take 1 tablet (150 mcg total) by mouth daily before breakfast.   metFORMIN (GLUCOPHAGE-XR) 500 MG 24 hr tablet Take 2 tablets (1,000 mg total) by mouth in the morning and at bedtime.   metroNIDAZOLE (METROGEL) 1 % gel Apply 1 application topically daily as needed (rosacea).   mycophenolate (CELLCEPT) 250 MG capsule Take 250 mg by mouth 2 (two) times daily.   OneTouch Delica Lancets 16W MISC USE TO CHECK SUGAR DAILY   ONETOUCH ULTRA test strip USE TO CHECK  SUGAR DAILY   pravastatin (PRAVACHOL) 40 MG tablet Take 40 mg by mouth every evening.   sertraline (ZOLOFT) 50 MG tablet Take 1 tablet (50 mg total) by mouth daily.   tacrolimus (PROGRAF) 1 MG capsule Take 2 mg by mouth 2 (two) times daily.     Allergies:   Watermelon flavor   Social History   Socioeconomic History   Marital status: Legally Separated    Spouse name: Not on file   Number of children: 1   Years of education: Not on file   Highest education level: Not on file  Occupational History   Occupation: Dispensing optician    Employer: [employer]    Comment: Manages "front" and writes orders --  in [place]   Occupation: Disabled  Tobacco Use   Smoking status: Never   Smokeless tobacco: Never  Vaping Use   Vaping Use: Never used  Substance and Sexual Activity   Alcohol use: No   Drug use: No   Sexual activity: Not on file  Other Topics Concern   Not on file  Social History Narrative   Married, 1989   1 child and 2 stepchildren   Prev ran a Programmer, systems Determinants of Radio broadcast assistant Strain: Not on file  Food Insecurity: Not on file  Transportation Needs: Not on file  Physical Activity: Not on file  Stress: Not on file  Social Connections: Not on file     Family History: The patient's family history includes Asthma in his mother; COPD in his father; Cancer in his father; Colon cancer in his maternal grandmother; Heart disease in his maternal grandfather and sister; Liver disease in his sister; Prostate cancer in his maternal uncle. There is no history of Esophageal cancer, Rectal cancer, or Stomach cancer.  ROS:   Please see the history of present illness.    All other systems reviewed and are negative.  EKGs/Labs/Other Studies Reviewed:    The following studies were reviewed today:  CT coronary 01/01/21:   IMPRESSION: 1.  Sievers type 1 bicuspid AV with calcium score 4920   2. Annular area 895 mm2 too large for currently available  TAVR valves   3.  Dilated aortic root 4.0 cm   4.  Coronary arteries sufficient height above annulus for deployment   5. Optimum angiographic angle for deployment LAO 5 Cranial 5 degrees  CT chest 01/01/21:   IMPRESSION: 1. Vascular findings and measurements pertinent to potential TAVR procedure, as detailed above. 2. Severe thickening calcification of the aortic valve, compatible with reported clinical history of severe aortic stenosis. 3. Multiple nonobstructive pulmonary emboli in the pulmonary arterial tree bilaterally, as above. Additional emboli are suspected, but the quality of pulmonary artery opacification on today's examination which was arterially timed is sub optimal. Further evaluation with dedicated PE protocol CT scan is recommended at this time. 4. Hepatic cirrhosis with severe hepatic steatosis. Notably, there is a hypervascular area in segment 5 of the liver. This may simply represent an area of focal fatty sparing, however, an aggressive lesion such as hepatocellular carcinoma is not excluded. Correlation with AFP levels and follow-up abdominal MRI with and without IV gadolinium is recommended in the near future to better evaluate this finding and exclude neoplasm. 5. Aortic atherosclerosis, in addition to 2 vessel coronary artery disease. There is also ectasia of the ascending thoracic aorta (4.1 cm in diameter). Recommend annual imaging followup by CTA or MRA. This recommendation follows 2010 ACCF/AHA/AATS/ACR/ASA/SCA/SCAI/SIR/STS/SVM Guidelines for the Diagnosis and Management of Patients with Thoracic Aortic Disease. Circulation. 2010; 121: A768-T157. Aortic aneurysm NOS (ICD10-I71.9). 6. Additional incidental findings, as above.   Echo 11/25/20:  1. Left ventricular ejection fraction, by estimation, is 60 to 65%. The  left ventricle has normal function. The left ventricle has no regional  wall motion abnormalities. There is moderate left ventricular  hypertrophy.  Left ventricular diastolic  parameters were normal.   2. Right ventricular systolic function is normal. The right ventricular  size is normal.   3. Left atrial size was mildly dilated.   4. The mitral valve is abnormal. Trivial mitral valve regurgitation. No  evidence of mitral stenosis. Moderate mitral annular calcification.   5. Gradients lower than  TTE done 06/18/20 DVI lower prevoiusly 0.31 AVA  similar Fused right and left cusps. The aortic valve is calcified. There  is severe calcifcation of the aortic valve. There is severe thickening of  the aortic valve. Aortic valve  regurgitation is not visualized. Moderate to severe aortic valve stenosis.   6. There is moderate dilatation of the aortic root, measuring 42 mm.   7. The inferior vena cava is normal in size with greater than 50%  respiratory variability, suggesting right atrial pressure of 3 mmHg.    EKG:  EKG is not ordered today.    Recent Labs: 11/18/2020: ALT 71; BUN 18; Platelets 224.0; TSH 2.07 12/20/2020: Hemoglobin 13.9; Potassium 4.6; Sodium 139 01/02/2021: Creatinine, Ser 1.20   Recent Lipid Panel    Component Value Date/Time   CHOL 108 11/18/2020 0958   CHOL 145 05/20/2020 0921   TRIG 397.0 (H) 11/18/2020 0958   HDL 33.50 (L) 11/18/2020 0958   HDL 32 (L) 05/20/2020 0921   CHOLHDL 3 11/18/2020 0958   VLDL 79.4 (H) 11/18/2020 0958   LDLCALC 35 05/20/2020 0921   LDLDIRECT 35.0 11/18/2020 0958   Physical Exam:    VS:  BP 102/60   Pulse 78   Ht 5\' 9"  (1.753 m)   Wt 206 lb (93.4 kg)   SpO2 99%   BMI 30.42 kg/m     Wt Readings from Last 3 Encounters:  01/06/21 206 lb (93.4 kg)  12/27/20 206 lb 12.8 oz (93.8 kg)  12/20/20 208 lb (94.3 kg)    General: Well developed, well nourished, NAD Lungs:Clear to ausculation bilaterally. No wheezes, rales, or rhonchi. Breathing is unlabored. Cardiovascular: RRR with S1 S2. + murmur Abdomen: Soft, non-tender, distended Extremities: No edema. Neuro:  Alert and oriented. No focal deficits. No facial asymmetry. MAE spontaneously. Psych: Responds to questions appropriately with normal affect.    ASSESSMENT/PLAN:    1. Severe aortic stenosis: Currently undergoing further workup for possible TAVR. Unfortunately, he will not be a candidate based on annular area measuring 851mm2>>>too large for currently available TAVR valves. He sees Dr. Cyndia Bent on 01/29/21. Also concerning with new scattered PE found on CT imaging. Started on Eliquis for at least 3 months. Scans also with questionable hepatic lesion>>see below.   2. Pulmonary Embolism: As above, found to have multiple nonobstructive pulmonary emboli in the pulmonary arterial tree bilaterally, as above in 01/01/21. Additional emboli were suspected, but the quality of pulmonary artery opacification on examination, which was arterially timed, was felt to be sub optimal. Further evaluation with dedicated PE protocol CT scan was recommended. He was subsequently scheduled for an urgent OP pulmonary embolism CT imaging at [facility] that showed no central obstruction. He was started on Eliquis mg PO BID. Doing well today with no new DOE. Obtain CBC. 30 day Eliquis card given.   3. Hx of liver transplant: Follows with [facility], Dr. Pauline Good. Pre TAVR imaging showed hepatic cirrhosis with severe hepatic steatosis. Notably, there was a hypervascular area in segment 5 of the liver which may represent an area of focal fatty sparing, however, an aggressive lesion such as hepatocellular carcinoma could not excluded. Correlation with AFP levels and follow-up abdominal MRI with and without IV gadolinium was recommended in the near future to better evaluate this finding and exclude neoplasm. Will need further evaluation per transplant team. Will contact to see if they prefer [place] to image or refer to [facility]   Medication Adjustments/Labs and Tests Ordered: Current medicines are reviewed  at length with the patient today.   Concerns regarding medicines are outlined above.  Orders Placed This Encounter  Procedures   CBC   No orders of the defined types were placed in this encounter.   Patient Instructions  Medication Instructions:  Your physician recommends that you continue on your current medications as directed. Please refer to the Current Medication list given to you today.  *If you need a refill on your cardiac medications before your next appointment, please call your pharmacy*   Lab Work: TODAY: CBC If you have labs (blood work) drawn today and your tests are completely normal, you will receive your results only by: [place] (if you have MyChart) OR A paper copy in the mail If you have any lab test that is abnormal or we need to change your treatment, we will call you to review the results.   Testing/Procedures: NONE   Follow-Up: At [facility], you and your health needs are our priority.  As part of our continuing mission to provide you with exceptional heart care, we have created designated Provider Care Teams.  These Care Teams include your primary Cardiologist (physician) and Advanced Practice Providers (APPs -  Physician Assistants and Nurse Practitioners) who all work together to provide you with the care you need, when you need it.  We recommend signing up for the patient portal called "MyChart".  Sign up information is provided on this After Visit Summary.  MyChart is used to connect with patients for Virtual Visits (Telemedicine).  Patients are able to view lab/test results, encounter notes, upcoming appointments, etc.  Non-urgent messages can be sent to your provider as well.   To learn more about what you can do with MyChart, go to [email or web address].    Your next appointment:   KEEP SCHEDULED FOLLOW UP WITH DR. Oren Binet, NP  01/06/2021 12:07 PM    [facility]

## 2021-01-07 NOTE — Unmapped (Signed)
Received msg from Dr.Jill McDaniel([phone]) from [facility] requesting review of patient's recent CT scan which showed an area of hyperactivity on the liver, to see if it was comparable to previous imaging.

## 2021-01-14 DIAGNOSIS — E612 Magnesium deficiency: Secondary | ICD-10-CM | POA: Diagnosis not present

## 2021-01-14 DIAGNOSIS — Z944 Liver transplant status: Secondary | ICD-10-CM | POA: Diagnosis not present

## 2021-01-14 DIAGNOSIS — Z5181 Encounter for therapeutic drug level monitoring: Secondary | ICD-10-CM | POA: Diagnosis not present

## 2021-01-15 LAB — CBC W/ DIFFERENTIAL
BANDED NEUTROPHILS ABSOLUTE COUNT: 0.1 10*3/uL (ref 0.0–0.1)
BASOPHILS ABSOLUTE COUNT: 0.1 10*3/uL (ref 0.0–0.2)
BASOPHILS RELATIVE PERCENT: 1 %
EOSINOPHILS ABSOLUTE COUNT: 0.3 10*3/uL (ref 0.0–0.4)
EOSINOPHILS RELATIVE PERCENT: 4 %
HEMATOCRIT: 43.6 % (ref 37.5–51.0)
HEMOGLOBIN: 14.5 g/dL (ref 13.0–17.7)
IMMATURE GRANULOCYTES: 1 %
LYMPHOCYTES ABSOLUTE COUNT: 2.1 10*3/uL (ref 0.7–3.1)
LYMPHOCYTES RELATIVE PERCENT: 23 %
MEAN CORPUSCULAR HEMOGLOBIN CONC: 33.3 g/dL (ref 31.5–35.7)
MEAN CORPUSCULAR HEMOGLOBIN: 30 pg (ref 26.6–33.0)
MEAN CORPUSCULAR VOLUME: 90 fL (ref 79–97)
MONOCYTES ABSOLUTE COUNT: 0.8 10*3/uL (ref 0.1–0.9)
MONOCYTES RELATIVE PERCENT: 9 %
NEUTROPHILS ABSOLUTE COUNT: 5.6 10*3/uL (ref 1.4–7.0)
NEUTROPHILS RELATIVE PERCENT: 62 %
PLATELET COUNT: 239 10*3/uL (ref 150–450)
RED BLOOD CELL COUNT: 4.83 x10E6/uL (ref 4.14–5.80)
RED CELL DISTRIBUTION WIDTH: 12.7 % (ref 11.6–15.4)
WHITE BLOOD CELL COUNT: 9 10*3/uL (ref 3.4–10.8)

## 2021-01-15 LAB — COMPREHENSIVE METABOLIC PANEL
A/G RATIO: 2 (ref 1.2–2.2)
ALBUMIN: 5 g/dL — ABNORMAL HIGH (ref 3.8–4.9)
ALKALINE PHOSPHATASE: 80 IU/L (ref 44–121)
ALT (SGPT): 70 IU/L — ABNORMAL HIGH (ref 0–44)
AST (SGOT): 45 IU/L — ABNORMAL HIGH (ref 0–40)
BILIRUBIN TOTAL: 0.3 mg/dL (ref 0.0–1.2)
BLOOD UREA NITROGEN: 22 mg/dL (ref 6–24)
BUN / CREAT RATIO: 21 — ABNORMAL HIGH (ref 9–20)
CALCIUM: 9.4 mg/dL (ref 8.7–10.2)
CHLORIDE: 101 mmol/L (ref 96–106)
CO2: 24 mmol/L (ref 20–29)
CREATININE: 1.03 mg/dL (ref 0.76–1.27)
GLOBULIN, TOTAL: 2.5 g/dL (ref 1.5–4.5)
GLUCOSE: 150 mg/dL — ABNORMAL HIGH (ref 70–99)
POTASSIUM: 4.8 mmol/L (ref 3.5–5.2)
SODIUM: 138 mmol/L (ref 134–144)
TOTAL PROTEIN: 7.5 g/dL (ref 6.0–8.5)

## 2021-01-15 LAB — GAMMA GT: GAMMA GLUTAMYL TRANSFERASE: 76 IU/L — ABNORMAL HIGH (ref 0–65)

## 2021-01-15 LAB — MAGNESIUM: MAGNESIUM: 1.7 mg/dL (ref 1.6–2.3)

## 2021-01-15 LAB — PHOSPHORUS: PHOSPHORUS, SERUM: 2.8 mg/dL (ref 2.8–4.1)

## 2021-01-15 LAB — BILIRUBIN, DIRECT: BILIRUBIN DIRECT: 0.14 mg/dL (ref 0.00–0.40)

## 2021-01-17 LAB — TACROLIMUS LEVEL: TACROLIMUS BLOOD: 6.3 ng/mL (ref 2.0–20.0)

## 2021-01-22 NOTE — Unmapped (Signed)
[facility] Refill Coordination Note    Specialty Medication(s) to be Shipped:   Transplant: tacrolimus 1mg     Other medication(s) to be shipped: Fosamax [address] [facility]     [address] [place], [place]: 10-Sep-1960  Phone: [phone] (home)       All above HIPAA information was verified with patient.     Was a Nurse, learning disability used for this call? No    Completed refill call assessment today to schedule patient's medication shipment from the [facility] [phone]).  All relevant notes have been reviewed.     Specialty medication(s) and dose(s) confirmed: Regimen is correct and unchanged.   Changes to medications: [name] reports no changes at this time.  Changes to insurance: No  New side effects reported not previously addressed with a pharmacist or physician: None reported  Questions for the pharmacist: No    Confirmed patient received a Conservation officer, historic buildings and a Surveyor, mining with first shipment. The patient will receive a drug information handout for each medication shipped and additional FDA Medication Guides as required.       DISEASE/MEDICATION-SPECIFIC INFORMATION        N/A    SPECIALTY MEDICATION ADHERENCE     Medication Adherence    Patient reported X missed doses in the last month: 0  Specialty Medication: tacrolimus 1 MG  Patient is on additional specialty medications: No  Patient is on more than two specialty medications: No  Any gaps in refill history greater than 2 weeks in the last 3 months: no  Demonstrates understanding of importance of adherence: yes  Informant: patient  Reliability of informant: reliable  Confirmed plan for next specialty medication refill: delivery by pharmacy  Refills needed for supportive medications: not needed              Were doses missed due to medication being on hold? No    tacrolimus 1 MG : 9 days of medicine on hand        REFERRAL TO PHARMACIST     Referral to the pharmacist: Not needed      [facility]     Shipping address confirmed in Epic. Delivery Scheduled: Yes, Expected medication delivery date: 01/28/21.     Medication will be delivered via UPS to the prescription address in Epic WAM.    Yolonda Kida   [facility] Specialty Technician

## 2021-01-27 MED FILL — TACROLIMUS 1 MG CAPSULE, IMMEDIATE-RELEASE: ORAL | 30 days supply | Qty: 120 | Fill #6

## 2021-01-27 MED FILL — ALENDRONATE 70 MG TABLET: ORAL | 28 days supply | Qty: 4 | Fill #1

## 2021-01-29 ENCOUNTER — Other Ambulatory Visit: Payer: Self-pay

## 2021-01-29 ENCOUNTER — Institutional Professional Consult (permissible substitution): Payer: Medicare Other | Admitting: Surgery

## 2021-01-29 VITALS — BP 129/88 | HR 77 | Resp 20 | Ht 69.0 in | Wt 205.0 lb

## 2021-01-29 DIAGNOSIS — I2510 Atherosclerotic heart disease of native coronary artery without angina pectoris: Secondary | ICD-10-CM | POA: Diagnosis not present

## 2021-01-29 DIAGNOSIS — I350 Nonrheumatic aortic (valve) stenosis: Secondary | ICD-10-CM | POA: Diagnosis not present

## 2021-01-29 NOTE — Unmapped (Signed)
Lvm for pt re 03/24/21 mri w sedation and chest ct in [place]. Gave him thorough appt details including npo and left call-back number if he has any questions.

## 2021-01-30 ENCOUNTER — Encounter: Payer: Self-pay | Admitting: *Deleted

## 2021-01-30 ENCOUNTER — Other Ambulatory Visit: Payer: Self-pay | Admitting: *Deleted

## 2021-01-30 DIAGNOSIS — I350 Nonrheumatic aortic (valve) stenosis: Secondary | ICD-10-CM

## 2021-01-30 DIAGNOSIS — I2510 Atherosclerotic heart disease of native coronary artery without angina pectoris: Secondary | ICD-10-CM

## 2021-02-02 ENCOUNTER — Encounter: Payer: Self-pay | Admitting: Surgery

## 2021-02-02 NOTE — Progress Notes (Signed)
Cardiothoracic Surgery Consultation  PCP is [name], MD Referring Provider is [name]*  Chief Complaint  Patient presents with   Coronary Artery Disease    Initial surgical consult, Echo 10/3, 10/25, CT chest 11/10    HPI:  The patient is a 60 year old gentleman with history of diabetes, hypertension, cirrhosis of the liver status post liver transplant on 09/16/2018 at [facility], OSA on CPAP, coronary artery disease, and bicuspid aortic valve disease with severe stenosis who has been followed by [name] and was referred to the structural heart valve clinic for treatment of his aortic stenosis.  He had mild coronary disease by catheterization 2020 prior to his transplant.  He has been followed for moderate aortic stenosis.  His most recent echocardiogram on 11/25/2020 showed a thickened and calcified aortic valve with restricted leaflet mobility.  The mean gradient was 31 mmHg with a peak gradient of 54 mmHg.  Stroke-volume index was 32.  Aortic valve area was 0.8 cm with a dimensionless index of 0.22 consistent with severe aortic stenosis.  Left ventricular ejection fraction was 60 to 65% with moderate LVH.  He reports progressive exertional shortness of breath and fatigue.  He has had no chest pain or pressure.  He denies any peripheral edema.  He has had intermittent dizziness for several years.  He was being worked up for consideration of TAVR and CTA of the chest showed multiple nonobstructive pulmonary emboli bilaterally.  He therefore underwent a dedicated PE protocol CT which showed no central pulmonary emboli.  He was started on Eliquis with a plan for 3 months of anticoagulation.  The CT scan also showed signs of hepatic cirrhosis with a hypervascular area of concern peripherally within segment 5.  It was recommended that he have an abdominal MRI with and without IV gadolinium to rule out neoplasm.  This has apparently been scheduled through his transplant physician  at [facility] Dr. [name].  Past Medical History:  Diagnosis Date   Allergy    Asthma    Bicuspid aortic valve    sees dr Harrington Challenger   Cirrhosis [facility])    Diabetes mellitus without complication ([place])    Fatty liver    with h/o elevated LFT's   GERD (gastroesophageal reflux disease)    Heart murmur    Hypertension    Itching    all over last few months   Jaundice    Liver transplant recipient ([place])    09/16/2018 at [facility]   Migraine with aura    OSA (obstructive sleep apnea) 09/14/2011   PSG 11/08/11>>AHI 31.6, SpO2 low 85%. wears CPAP, pt does not know settings   Other abnormal glucose    diet controlled diabetic   Thyroid disease     Past Surgical History:  Procedure Laterality Date   APPENDECTOMY  10/09   Emergency   BIOPSY THYROID  08/19/07   Attempted, no tissue obtained   CARDIAC CATHETERIZATION  03/21/2018   CARDIOVASCULAR STRESS TEST  03/07   Negative 06/05   DOPPLER ECHOCARDIOGRAPHY  06/04   ESOPHAGEAL BANDING N/A 05/04/2016   Procedure: ESOPHAGEAL BANDING;  Surgeon: [name], MD;  Location: [facility];  Service: Endoscopy;  Laterality: N/A;   ESOPHAGOGASTRODUODENOSCOPY (EGD) WITH PROPOFOL N/A 05/04/2016   Procedure: ESOPHAGOGASTRODUODENOSCOPY (EGD) WITH PROPOFOL;  Surgeon: [name], MD;  Location: [facility];  Service: Endoscopy;  Laterality: N/A;   LIVER TRANSPLANT     09/16/2018 at [facility]   RIGHT/LEFT HEART CATH AND CORONARY ANGIOGRAPHY  N/A 12/20/2020   Procedure: RIGHT/LEFT HEART CATH AND CORONARY ANGIOGRAPHY;  Surgeon: [name], MD;  Location: [place] [facility];  Service: Cardiovascular;  Laterality: N/A;    Family History  Problem Relation Age of Onset   Asthma Mother    Cancer Father        Died when pt was 62 of CA with mets, site unknown, COPD   COPD Father    Heart disease Sister        Heart stopped   Liver disease Sister        "gene" for liver disease   Colon cancer Maternal Grandmother    Heart disease Maternal Grandfather         MI, 58 YOA   Prostate cancer Maternal Uncle    Esophageal cancer Neg Hx    Rectal cancer Neg Hx    Stomach cancer Neg Hx     Social History Social History   Tobacco Use   Smoking status: Never   Smokeless tobacco: Never  Vaping Use   Vaping Use: Never used  Substance Use Topics   Alcohol use: No   Drug use: No    Current Outpatient Medications  Medication Sig Dispense Refill   acetaminophen (TYLENOL) 500 MG tablet Take 500 mg by mouth every 6 (six) hours as needed for moderate pain or headache.     albuterol (VENTOLIN HFA) 108 (90 Base) MCG/ACT inhaler Inhale 2 puffs into the lungs every 6 (six) hours as needed for wheezing or shortness of breath.     alendronate (FOSAMAX) 70 MG tablet Take 70 mg by mouth every Sunday. Take with a full glass of water on an empty stomach.     amLODipine (NORVASC) 10 MG tablet Take 1 tablet (10 mg total) by mouth in the morning. 90 tablet 3   apixaban (ELIQUIS) 5 MG TABS tablet Take 1 tablet (5 mg total) by mouth 2 (two) times daily. 60 tablet 3   aspirin EC 81 MG tablet Take 81 mg by mouth daily.     azelastine (ASTELIN) 0.1 % nasal spray PLACE 2 SPRAYS INTO BOTH NOSTRILS TWICE DAILY AS NEEDED 30 mL 1   calcium carbonate (TUMS - DOSED IN MG ELEMENTAL CALCIUM) 500 MG chewable tablet Chew 1 tablet by mouth daily.     Cholecalciferol (VITAMIN D) 50 MCG (2000 UT) tablet Take 2,000 Units by mouth daily.     fluticasone (FLONASE) 50 MCG/ACT nasal spray PLACE ONE OR TWO SPRAYS INTO BOTH NOSTRILS DAILY AS NEEDED. 48 g 3   levothyroxine (SYNTHROID) 150 MCG tablet Take 1 tablet (150 mcg total) by mouth daily before breakfast.     metFORMIN (GLUCOPHAGE-XR) 500 MG 24 hr tablet Take 2 tablets (1,000 mg total) by mouth in the morning and at bedtime. 360 tablet 3   metroNIDAZOLE (METROGEL) 1 % gel Apply 1 application topically daily as needed (rosacea).     mycophenolate (CELLCEPT) 250 MG capsule Take 250 mg by mouth 2 (two) times daily.     OneTouch Delica  Lancets 15Q MISC USE TO CHECK SUGAR DAILY 100 each 3   ONETOUCH ULTRA test strip USE TO CHECK SUGAR DAILY 100 strip 0   pravastatin (PRAVACHOL) 40 MG tablet Take 40 mg by mouth every evening.     sertraline (ZOLOFT) 50 MG tablet Take 1 tablet (50 mg total) by mouth daily. 90 tablet 3   tacrolimus (PROGRAF) 1 MG capsule Take 2 mg by mouth 2 (two) times daily.  No current facility-administered medications for this visit.    Allergies  Allergen Reactions   Watermelon Flavor     Mouth itching    Review of Systems  Constitutional:  Positive for activity change and fatigue.  HENT:  Positive for hearing loss. Negative for dental problem.   Eyes: Negative.   Respiratory:  Positive for apnea and shortness of breath.        Uses CPAP  Cardiovascular:  Negative for chest pain and leg swelling.  Gastrointestinal: Negative.   Endocrine: Negative.   Genitourinary: Negative.   Musculoskeletal: Negative.   Skin: Negative.   Allergic/Immunologic: Positive for immunocompromised state.       For liver transplant  Neurological:  Positive for dizziness.  Hematological: Negative.   Psychiatric/Behavioral: Negative.     BP 129/88 (BP Location: Left Arm, Patient Position: Sitting)   Pulse 77   Resp 20   Ht 5\' 9"  (1.753 m)   Wt 205 lb (93 kg)   SpO2 96% Comment: RA  BMI 30.27 kg/m  Physical Exam Constitutional:      Appearance: Normal appearance. He is normal weight.  HENT:     Head: Normocephalic and atraumatic.     Mouth/Throat:     Mouth: Mucous membranes are moist.     Pharynx: Oropharynx is clear.  Eyes:     Extraocular Movements: Extraocular movements intact.     Pupils: Pupils are equal, round, and reactive to light.  Cardiovascular:     Rate and Rhythm: Normal rate and regular rhythm.     Pulses: Normal pulses.     Heart sounds: Murmur heard.     Comments: 3/6 systolic murmur RSB, no diastolic murmur Pulmonary:     Effort: Pulmonary effort is normal.     Breath sounds:  Normal breath sounds.  Abdominal:     General: Bowel sounds are normal. There is no distension.     Tenderness: There is no abdominal tenderness.  Musculoskeletal:        General: No swelling.     Cervical back: Normal range of motion and neck supple.  Skin:    General: Skin is warm and dry.     Coloration: Skin is not jaundiced.  Neurological:     General: No focal deficit present.     Mental Status: He is alert and oriented to person, place, and time.  Psychiatric:        Mood and Affect: Mood normal.        Behavior: Behavior normal.     Diagnostic Tests:  ECHOCARDIOGRAM REPORT         Patient Name:   [name] Date of Exam: 11/25/2020  Medical Rec #:  [id number]      Height:       69.0 in  Accession #:    [id number]     Weight:       209.0 lb  Date of Birth:  July 29, 1960      BSA:          2.105 m  Patient Age:    89 years       BP:           124/82 mmHg  Patient Gender: M              HR:           62 bpm.  Exam Location:  [place]   Procedure: 2D Echo, Cardiac Doppler and Color Doppler   Indications:  I35.0 AS     History:        Patient has prior history of Echocardiogram examinations,  most                  recent 06/18/2020. AS; Risk Factors:Hypertension and                  Dyslipidemia.     Sonographer:    Coralyn Helling RDCS  Referring Phys: [id number] PAULA V ROSS   IMPRESSIONS     1. Left ventricular ejection fraction, by estimation, is 60 to 65%. The  left ventricle has normal function. The left ventricle has no regional  wall motion abnormalities. There is moderate left ventricular hypertrophy.  Left ventricular diastolic  parameters were normal.   2. Right ventricular systolic function is normal. The right ventricular  size is normal.   3. Left atrial size was mildly dilated.   4. The mitral valve is abnormal. Trivial mitral valve regurgitation. No  evidence of mitral stenosis. Moderate mitral annular calcification.   5. Gradients lower  than TTE done 06/18/20 DVI lower prevoiusly 0.31 AVA  similar Fused right and left cusps. The aortic valve is calcified. There  is severe calcifcation of the aortic valve. There is severe thickening of  the aortic valve. Aortic valve  regurgitation is not visualized. Moderate to severe aortic valve stenosis.   6. There is moderate dilatation of the aortic root, measuring 42 mm.   7. The inferior vena cava is normal in size with greater than 50%  respiratory variability, suggesting right atrial pressure of 3 mmHg.   FINDINGS   Left Ventricle: Left ventricular ejection fraction, by estimation, is 60  to 65%. The left ventricle has normal function. The left ventricle has no  regional wall motion abnormalities. The left ventricular internal cavity  size was normal in size. There is   moderate left ventricular hypertrophy. Left ventricular diastolic  parameters were normal.   Right Ventricle: The right ventricular size is normal. No increase in  right ventricular wall thickness. Right ventricular systolic function is  normal.   Left Atrium: Left atrial size was mildly dilated.   Right Atrium: Right atrial size was normal in size.   Pericardium: There is no evidence of pericardial effusion.   Mitral Valve: The mitral valve is abnormal. There is mild thickening of  the mitral valve leaflet(s). There is mild calcification of the mitral  valve leaflet(s). Moderate mitral annular calcification. Trivial mitral  valve regurgitation. No evidence of  mitral valve stenosis.   Tricuspid Valve: The tricuspid valve is normal in structure. Tricuspid  valve regurgitation is not demonstrated. No evidence of tricuspid  stenosis.   Aortic Valve: Gradients lower than TTE done 06/18/20 DVI lower prevoiusly  0.31 AVA similar Fused right and left cusps. The aortic valve is  calcified. There is severe calcifcation of the aortic valve. There is  severe thickening of the aortic valve. Aortic  valve  regurgitation is not visualized. Moderate to severe aortic stenosis  is present. Aortic valve mean gradient measures 31.0 mmHg. Aortic valve  peak gradient measures 53.9 mmHg. Aortic valve area, by VTI measures 0.82  cm.   Pulmonic Valve: The pulmonic valve was normal in structure. Pulmonic valve  regurgitation is not visualized. No evidence of pulmonic stenosis.   Aorta: The ascending aorta was not well visualized. There is moderate  dilatation of the aortic root, measuring 42 mm.   Venous: The inferior vena cava is normal in  size with greater than 50%  respiratory variability, suggesting right atrial pressure of 3 mmHg.   IAS/Shunts: No atrial level shunt detected by color flow Doppler.      LEFT VENTRICLE  PLAX 2D  LVIDd:         4.70 cm  Diastology  LVIDs:         3.30 cm  LV e' medial:    5.77 cm/s  LV PW:         1.10 cm  LV E/e' medial:  16.6  LV IVS:        1.60 cm  LV e' lateral:   9.57 cm/s  LVOT diam:     2.20 cm  LV E/e' lateral: 10.0  LV SV:         67  LV SV Index:   32  LVOT Area:     3.80 cm      LEFT ATRIUM             Index       RIGHT ATRIUM           Index  LA diam:        4.00 cm 1.90 cm/m  RA Pressure: 3.00 mmHg  LA Vol (A2C):   67.1 ml 31.88 ml/m RA Area:     19.30 cm  LA Vol (A4C):   48.4 ml 22.99 ml/m RA Volume:   58.70 ml  27.89 ml/m  LA Biplane Vol: 62.3 ml 29.60 ml/m   AORTIC VALVE  AV Area (Vmax):    0.81 cm  AV Area (Vmean):   0.80 cm  AV Area (VTI):     0.82 cm  AV Vmax:           367.00 cm/s  AV Vmean:          259.500 cm/s  AV VTI:            0.822 m  AV Peak Grad:      53.9 mmHg  AV Mean Grad:      31.0 mmHg  LVOT Vmax:         78.20 cm/s  LVOT Vmean:        54.600 cm/s  LVOT VTI:          0.177 m  LVOT/AV VTI ratio: 0.22     AORTA  Ao Root diam: 4.20 cm  Ao Asc diam:  3.80 cm   MITRAL VALVE                TRICUSPID VALVE  MV Area (PHT): 2.24 cm     Estimated RAP:  3.00 mmHg  MV Decel Time: 338 msec  MV E velocity:  95.90 cm/s   SHUNTS  MV A velocity: 113.00 cm/s  Systemic VTI:  0.18 m  MV E/A ratio:  0.85         Systemic Diam: 2.20 cm   Jenkins Rouge MD  Electronically signed by Jenkins Rouge MD  Signature Date/Time: 11/25/2020/9:39:22 AM         Final     Physicians  Panel Physicians Referring Physician Case Authorizing Physician  [name], MD (Primary)     Procedures  RIGHT/LEFT HEART CATH AND CORONARY ANGIOGRAPHY   Conclusion      Prox RCA lesion is 20% stenosed.   Mid RCA to Dist RCA lesion is 20% stenosed.   RPDA lesion is 20% stenosed.   Mid LAD lesion is 80% stenosed.   Severe mid LAD  stenosis Mild non-obstructive disease in the RCA and Circumflex Severe aortic stenosis (mean gradient 50.6 mmHg, peak to peak gradient 66 mmHg, AVA 0.78 cm2).    Recommendations: He has a severe mid LAD stenosis (not critical) and severe AS with bicuspid aortic valve. Ideally he would be treated with single vessel CABG (LIMA to LAD) and surgical AVR given his young age and given the pathology associated with bicuspid aortic valves. Given his prior liver transplantation, he will be higher risk for CABG/AVR. If he is not felt to be a good candidate for surgery, will plan PCI of the LAD followed by TAVR. Will review with the structural heart team and plan for him to see Dr. Cyndia Bent in the CT surgery office following his CT scans.    Indications  Coronary artery disease involving native coronary artery of native heart without angina pectoris [I25.10 (ICD-10-CM)]  Severe aortic stenosis [I35.0 (ICD-10-CM)]   Procedural Details  Technical Details Indication: 60 yo male with history of liver transplant, bicuspid AV now with severe AS, CAD. Planning for TAVR vs AVR  Procedure: The risks, benefits, complications, treatment options, and expected outcomes were discussed with the patient. The patient and/or family concurred with the proposed plan, giving informed consent. The patient was brought  to the cath lab after IV hydration was given. The patient was sedated with Versed and Fentanyl. The IV catheter in the right antecubital vein was changed for a 5 [place] sheath. Right heart catheterization performed with a balloon tipped catheter. The right wrist was prepped and draped in a sterile fashion. 1% lidocaine was used for local anesthesia. Using the modified Seldinger access technique, a 5 French sheath was placed in the right radial artery. 3 mg Verapamil was given through the sheath. 4000 units IV heparin was given. Standard diagnostic catheters were used to perform selective coronary angiography. I crossed the aortic valve with the JR4 catheter and a J wire. The sheath was removed from the right radial artery and a Terumo hemostasis band was applied at the arteriotomy site on the right wrist.      Estimated blood loss <50 mL.   During this procedure medications were administered to achieve and maintain moderate conscious sedation while the patient's heart rate, blood pressure, and oxygen saturation were continuously monitored and I was present face-to-face 100% of this time.   Medications (Filter: Administrations occurring from 1114 to 1216 on 12/20/20) fentaNYL (SUBLIMAZE) injection (mcg) Total dose:  50 mcg Date/Time Rate/Dose/Volume Action   12/20/20 1130 50 mcg Given    midazolam (VERSED) injection (mg) Total dose:  2 mg Date/Time Rate/Dose/Volume Action   12/20/20 1130 2 mg Given    Heparin (Porcine) in NaCl 1000-0.9 UT/500ML-% SOLN (mL) Total volume:  1,000 mL Date/Time Rate/Dose/Volume Action   12/20/20 1130 500 mL Given   1130 500 mL Given    lidocaine (PF) (XYLOCAINE) 1 % injection (mL) Total volume:  4 mL Date/Time Rate/Dose/Volume Action   12/20/20 1139 4 mL Given    Radial Cocktail/Verapamil only (mL) Total volume:  10 mL Date/Time Rate/Dose/Volume Action   12/20/20 1142 10 mL Given    heparin sodium (porcine) injection (Units) Total dose:  4,000  Units Date/Time Rate/Dose/Volume Action   12/20/20 1152 4,000 Units Given    iohexol (OMNIPAQUE) 350 MG/ML injection (mL) Total volume:  45 mL Date/Time Rate/Dose/Volume Action   12/20/20 1206 45 mL Given    Sedation Time  Sedation Time Physician-1: 34 minutes 22 seconds Contrast  Medication Name Total Dose  iohexol (OMNIPAQUE) 350 MG/ML injection 45 mL   Radiation/Fluoro  Fluoro time: 6.3 (min) DAP: 20821 (mGycm2) Cumulative Air Kerma: 378 (mGy) Coronary Findings  Diagnostic Dominance: Right Left Anterior Descending  Mid LAD lesion is 80% stenosed.    Right Coronary Artery  Prox RCA lesion is 20% stenosed.  Mid RCA to Dist RCA lesion is 20% stenosed.    Right Posterior Descending Artery  RPDA lesion is 20% stenosed.    Intervention   No interventions have been documented.   Coronary Diagrams  Diagnostic Dominance: Right Intervention  Implants     No implant documentation for this case.   Syngo Images   Show images for CARDIAC CATHETERIZATION Images on Long Term Storage   Show images for Shareef, Eddinger to Procedure Log  Procedure Log    Hemo Data  Flowsheet Row Most Recent Value  Fick Cardiac Output 5.37 L/min  Fick Cardiac Output Index 2.56 (L/min)/BSA  Aortic Mean Gradient 50.58 mmHg  Aortic Peak Gradient 66 mmHg  Aortic Valve Area 0.78  Aortic Value Area Index 0.37 cm2/BSA  RA A Wave 3 mmHg  RA V Wave 2 mmHg  RA Mean 2 mmHg  RV Systolic Pressure 22 mmHg  RV Diastolic Pressure 2 mmHg  RV EDP 4 mmHg  PA Systolic Pressure 23 mmHg  PA Diastolic Pressure 8 mmHg  PA Mean 16 mmHg  PW A Wave 9 mmHg  PW V Wave 10 mmHg  PW Mean 8 mmHg  AO Systolic Pressure 324 mmHg  AO Diastolic Pressure 80 mmHg  AO Mean 401 mmHg  LV Systolic Pressure 027 mmHg  LV Diastolic Pressure 3 mmHg  LV EDP 7 mmHg  AOp Systolic Pressure 253 mmHg  AOp Diastolic Pressure 79 mmHg  AOp Mean Pressure 97 mmHg  LVp Systolic Pressure 664 mmHg  LVp Diastolic  Pressure 3 mmHg  LVp EDP Pressure 7 mmHg  QP/QS 1  TPVR Index 6.25 HRUI  TSVR Index 39.06 HRUI  PVR SVR Ratio 0.08  TPVR/TSVR Ratio 0.16    ADDENDUM REPORT: 01/01/2021 12:54   CLINICAL DATA:  Aortic stenosis   EXAM: Cardiac TAVR CT   TECHNIQUE: The patient was scanned on a Siemens Force 403 slice scanner. A 120 kV retrospective scan was triggered in the descending thoracic aorta at 111 HU's. Gantry rotation speed was 270 msecs and collimation was .9 mm. No beta blockade or nitro were given. The 3D data set was reconstructed in 5% intervals of the R-R cycle. Systolic and diastolic phases were analyzed on a dedicated work station using MPR, MIP and VRT modes. The patient received 80 cc of contrast.   FINDINGS: Aortic Valve: Bicuspid AV Sievers type 1 with fused right and left cusps Calcium score 4920   Aorta: Normal arch vessels no coarctation mild calcific atherosclerosis   Sinotubular Junction: 30 mm   Ascending Thoracic Aorta: 40 mm   Aortic Arch: 30 mm   Descending Thoracic Aorta: 24 mm   Sinus of Valsalva Measurements:   Long Axis: 41.5 mm   Short Axis 33.5 mm   Coronary Artery Height above Annulus:   Left Main: 14.4 mm above annulus   Right Coronary: 16.7 mm above annulus   Virtual Basal Annulus Measurements:   Maximum/Minimum Diameter: 38 mm x 28 mm   Perimeter: 108 mm   Area: 895 mm2   Coronary Arteries: Sufficient height above annulus for deployment   Optimum Fluoroscopic Angle for Delivery: LAO 5 Cranial 5 degrees   IMPRESSION: 1.  Sievers type 1 bicuspid AV with calcium score 4920   2. Annular area 895 mm2 too large for currently available TAVR valves   3.  Dilated aortic root 4.0 cm   4.  Coronary arteries sufficient height above annulus for deployment   5. Optimum angiographic angle for deployment LAO 5 Cranial 5 degrees   Jenkins Rouge     Electronically Signed   By: Jenkins Rouge M.D.   On: 01/01/2021 12:54    Addended by  Josue Hector, MD on 01/01/2021 12:56 PM   Study Result  Narrative & Impression  EXAM: OVER-READ INTERPRETATION  CT CHEST   The following report is an over-read performed by radiologist Dr. Vinnie Langton of [facility], [place] on 01/01/2021. This over-read does not include interpretation of cardiac or coronary anatomy or pathology. The coronary calcium score/coronary CTA interpretation by the cardiologist is attached.   COMPARISON:  None.   FINDINGS: Extracardiac findings will be described separately under dictation for contemporaneously obtained CTA chest, abdomen and pelvis.   IMPRESSION: Please see separate dictation for contemporaneously obtained CTA chest, abdomen and pelvis dated 01/01/2021 for full description of relevant extracardiac findings.   Electronically Signed: By: Vinnie Langton M.D. On: 01/01/2021 12:23   Impression:  This 60 year old gentleman has a Scientist, research (physical sciences) type I bicuspid aortic valve with severe calcification and thickening and stage D, severe, symptomatic aortic stenosis with New York Heart Association class ll symptoms of exertional fatigue and shortness of breath consistent with chronic diastolic congestive heart failure.  His cardiac catheterization shows an 80% proximal to mid LAD stenosis.  His gated cardiac CTA showed an annular area of 895 mm which is too large for TAVR.  Given his young age and the fact that his aortic annular area is too large for TAVR I think that open surgical aortic valve replacement and bypass of the LAD would be the best treatment for him.  He will need to complete his 39-month course of Eliquis for bilateral pulmonary emboli.  Then his Eliquis can be stopped at least 5 days prior to surgery.  I reviewed the cardiac catheterization and echo images with him and answered all of his questions. I discussed the operative procedure with the patient and family including alternatives, benefits and risks; including but not limited to  bleeding, blood transfusion, infection, stroke, myocardial infarction, graft failure, heart block requiring a permanent pacemaker, organ dysfunction, and death.  [name] understands and agrees to proceed.     Plan:  We will plan to proceed with aortic valve replacement and coronary bypass graft surgery using a bioprosthetic valve after he has completed a 69-month course of Eliquis.  This should be around the end of January or first part of February.  I spent 60 minutes performing this consultation and > 50% of this time was spent face to face counseling and coordinating the care of this patient's severe symptomatic aortic stenosis and single-vessel coronary artery disease.   Gaye Pollack, MD [facility] [phone]

## 2021-02-06 DIAGNOSIS — G4733 Obstructive sleep apnea (adult) (pediatric): Secondary | ICD-10-CM | POA: Diagnosis not present

## 2021-02-10 DIAGNOSIS — Z5181 Encounter for therapeutic drug level monitoring: Secondary | ICD-10-CM | POA: Diagnosis not present

## 2021-02-10 DIAGNOSIS — E612 Magnesium deficiency: Secondary | ICD-10-CM | POA: Diagnosis not present

## 2021-02-10 DIAGNOSIS — Z944 Liver transplant status: Secondary | ICD-10-CM | POA: Diagnosis not present

## 2021-02-11 ENCOUNTER — Telehealth: Payer: Self-pay | Admitting: Pulmonary Disease

## 2021-02-11 LAB — CBC W/ DIFFERENTIAL
BANDED NEUTROPHILS ABSOLUTE COUNT: 0.1 10*3/uL (ref 0.0–0.1)
BASOPHILS ABSOLUTE COUNT: 0.1 10*3/uL (ref 0.0–0.2)
BASOPHILS RELATIVE PERCENT: 1 %
EOSINOPHILS ABSOLUTE COUNT: 0.4 10*3/uL (ref 0.0–0.4)
EOSINOPHILS RELATIVE PERCENT: 4 %
HEMATOCRIT: 42.5 % (ref 37.5–51.0)
HEMOGLOBIN: 14.5 g/dL (ref 13.0–17.7)
IMMATURE GRANULOCYTES: 1 %
LYMPHOCYTES ABSOLUTE COUNT: 2.2 10*3/uL (ref 0.7–3.1)
LYMPHOCYTES RELATIVE PERCENT: 22 %
MEAN CORPUSCULAR HEMOGLOBIN CONC: 34.1 g/dL (ref 31.5–35.7)
MEAN CORPUSCULAR HEMOGLOBIN: 29.9 pg (ref 26.6–33.0)
MEAN CORPUSCULAR VOLUME: 88 fL (ref 79–97)
MONOCYTES ABSOLUTE COUNT: 0.9 10*3/uL (ref 0.1–0.9)
MONOCYTES RELATIVE PERCENT: 8 %
NEUTROPHILS ABSOLUTE COUNT: 6.8 10*3/uL (ref 1.4–7.0)
NEUTROPHILS RELATIVE PERCENT: 64 %
PLATELET COUNT: 256 10*3/uL (ref 150–450)
RED BLOOD CELL COUNT: 4.85 x10E6/uL (ref 4.14–5.80)
RED CELL DISTRIBUTION WIDTH: 12.2 % (ref 11.6–15.4)
WHITE BLOOD CELL COUNT: 10.4 10*3/uL (ref 3.4–10.8)

## 2021-02-11 LAB — COMPREHENSIVE METABOLIC PANEL
A/G RATIO: 1.6 (ref 1.2–2.2)
ALBUMIN: 4.6 g/dL (ref 3.8–4.9)
ALKALINE PHOSPHATASE: 87 IU/L (ref 44–121)
ALT (SGPT): 72 IU/L — ABNORMAL HIGH (ref 0–44)
AST (SGOT): 39 IU/L (ref 0–40)
BILIRUBIN TOTAL: 0.4 mg/dL (ref 0.0–1.2)
BLOOD UREA NITROGEN: 21 mg/dL (ref 6–24)
BUN / CREAT RATIO: 18 (ref 9–20)
CALCIUM: 9.8 mg/dL (ref 8.7–10.2)
CHLORIDE: 101 mmol/L (ref 96–106)
CO2: 22 mmol/L (ref 20–29)
CREATININE: 1.17 mg/dL (ref 0.76–1.27)
GLOBULIN, TOTAL: 2.8 g/dL (ref 1.5–4.5)
GLUCOSE: 175 mg/dL — ABNORMAL HIGH (ref 70–99)
POTASSIUM: 4.8 mmol/L (ref 3.5–5.2)
SODIUM: 137 mmol/L (ref 134–144)
TOTAL PROTEIN: 7.4 g/dL (ref 6.0–8.5)

## 2021-02-11 LAB — MAGNESIUM: MAGNESIUM: 1.6 mg/dL (ref 1.6–2.3)

## 2021-02-11 LAB — BILIRUBIN, DIRECT: BILIRUBIN DIRECT: 0.16 mg/dL (ref 0.00–0.40)

## 2021-02-11 LAB — PHOSPHORUS: PHOSPHORUS, SERUM: 3.1 mg/dL (ref 2.8–4.1)

## 2021-02-11 LAB — GAMMA GT: GAMMA GLUTAMYL TRANSFERASE: 83 IU/L — ABNORMAL HIGH (ref 0–65)

## 2021-02-11 NOTE — Telephone Encounter (Signed)
ATC patient to get him scheduled for a follow up after CPAP set up, LMTCB   When patient calls back please schedule him for an OV with Dr. Halford Chessman between 03/09/21- 05/07/21

## 2021-02-12 LAB — TACROLIMUS LEVEL: TACROLIMUS BLOOD: 6.1 ng/mL (ref 2.0–20.0)

## 2021-02-19 NOTE — Unmapped (Signed)
[facility] Refill Coordination Note    Specialty Medication(s) to be Shipped:   Transplant: tacrolimus 1mg     Other medication(s) to be shipped: alendronate     [name], DOB: [birth year]  Phone: [phone] (home)       All above HIPAA information was verified with patient.     Was a Nurse, learning disability used for this call? No    Completed refill call assessment today to schedule patient's medication shipment from the [facility] [phone]).  All relevant notes have been reviewed.     Specialty medication(s) and dose(s) confirmed: Regimen is correct and unchanged.   Changes to medications: [name] reports no changes at this time.  Changes to insurance: No  New side effects reported not previously addressed with a pharmacist or physician: None reported  Questions for the pharmacist: No    Confirmed patient received a Conservation officer, historic buildings and a Surveyor, mining with first shipment. The patient will receive a drug information handout for each medication shipped and additional FDA Medication Guides as required.       DISEASE/MEDICATION-SPECIFIC INFORMATION        N/A    SPECIALTY MEDICATION ADHERENCE     Medication Adherence    Patient reported X missed doses in the last month: 1  Specialty Medication: Tacrolimus 1mg   Patient is on additional specialty medications: No        Were doses missed due to medication being on hold? No    Tacrolimus 1 mg: 9 days of medicine on hand     REFERRAL TO PHARMACIST     Referral to the pharmacist: Not needed      [facility]     Shipping address confirmed in Epic.     Delivery Scheduled: Yes, Expected medication delivery date: 02/21/2021.     Medication will be delivered via UPS to the prescription address in Epic WAM.    [facility] Specialty Technician

## 2021-02-20 MED FILL — TACROLIMUS 1 MG CAPSULE, IMMEDIATE-RELEASE: ORAL | 30 days supply | Qty: 120 | Fill #7

## 2021-02-20 MED FILL — ALENDRONATE 70 MG TABLET: ORAL | 28 days supply | Qty: 4 | Fill #2

## 2021-02-26 DIAGNOSIS — E119 Type 2 diabetes mellitus without complications: Secondary | ICD-10-CM | POA: Diagnosis not present

## 2021-03-04 DIAGNOSIS — Z944 Liver transplant status: Principal | ICD-10-CM

## 2021-03-04 DIAGNOSIS — Z5181 Encounter for therapeutic drug level monitoring: Principal | ICD-10-CM

## 2021-03-04 DIAGNOSIS — C220 Liver cell carcinoma: Principal | ICD-10-CM

## 2021-03-04 DIAGNOSIS — E612 Magnesium deficiency: Principal | ICD-10-CM

## 2021-03-04 DIAGNOSIS — G4733 Obstructive sleep apnea (adult) (pediatric): Secondary | ICD-10-CM | POA: Diagnosis not present

## 2021-03-05 NOTE — Unmapped (Signed)
Patient scheduled for serial MRI/CT on 1/30, per Retreat score 1 guidelines. Standing [facility] orders refreshed, including AFPs.

## 2021-03-09 DIAGNOSIS — G4733 Obstructive sleep apnea (adult) (pediatric): Secondary | ICD-10-CM | POA: Diagnosis not present

## 2021-03-10 DIAGNOSIS — E612 Magnesium deficiency: Principal | ICD-10-CM

## 2021-03-10 DIAGNOSIS — Z5181 Encounter for therapeutic drug level monitoring: Principal | ICD-10-CM

## 2021-03-10 DIAGNOSIS — Z944 Liver transplant status: Principal | ICD-10-CM

## 2021-03-13 DIAGNOSIS — M810 Age-related osteoporosis without current pathological fracture: Principal | ICD-10-CM

## 2021-03-13 MED ORDER — ALENDRONATE 70 MG TABLET
ORAL_TABLET | ORAL | 2 refills | 28.00000 days
Start: 2021-03-13 — End: 2021-06-11

## 2021-03-14 NOTE — Unmapped (Signed)
[facility] Refill Coordination Note    Specialty Medication(s) to be Shipped:   Transplant: tacrolimus 1mg     Other medication(s) to be shipped: metformin, sertraline, pravastatin, alendronate, amlodipine     Anthony Alvarado, DOB: 10/10/1960  Phone: [phone] (home)       All above HIPAA information was verified with patient.     Was a Nurse, learning disability used for this call? No    Completed refill call assessment today to schedule patient's medication shipment from the [facility] [phone]).  All relevant notes have been reviewed.     Specialty medication(s) and dose(s) confirmed: Regimen is correct and unchanged.   Changes to medications: [name] reports no changes at this time.  Changes to insurance: No  New side effects reported not previously addressed with a pharmacist or physician: None reported  Questions for the pharmacist: No    Confirmed patient received a Conservation officer, historic buildings and a Surveyor, mining with first shipment. The patient will receive a drug information handout for each medication shipped and additional FDA Medication Guides as required.       DISEASE/MEDICATION-SPECIFIC INFORMATION        N/A    SPECIALTY MEDICATION ADHERENCE     Medication Adherence    Patient reported X missed doses in the last month: 0  Specialty Medication: tacrolimus 1 MG  Patient is on additional specialty medications: No              Were doses missed due to medication being on hold? No    Tacrolimus 1 mg: 7-10 days of medicine on hand        REFERRAL TO PHARMACIST     Referral to the pharmacist: Not needed      [facility]     Shipping address confirmed in Epic.     Delivery Scheduled: Yes, Expected medication delivery date: 03/20/21.     Medication will be delivered via UPS to the prescription address in Epic WAM.    Unk Lightning   [facility] Specialty Technician

## 2021-03-17 ENCOUNTER — Encounter: Payer: Self-pay | Admitting: Family Medicine

## 2021-03-17 ENCOUNTER — Other Ambulatory Visit: Payer: Self-pay

## 2021-03-17 ENCOUNTER — Ambulatory Visit (INDEPENDENT_AMBULATORY_CARE_PROVIDER_SITE_OTHER)
Admission: RE | Admit: 2021-03-17 | Discharge: 2021-03-17 | Disposition: A | Discharge status: Home / Self Care | Payer: Medicare Other | Source: Ambulatory Visit | Attending: Family Medicine | Admitting: Family Medicine

## 2021-03-17 ENCOUNTER — Ambulatory Visit (INDEPENDENT_AMBULATORY_CARE_PROVIDER_SITE_OTHER): Payer: Medicare Other | Admitting: Family Medicine

## 2021-03-17 VITALS — BP 122/82 | HR 72 | Temp 97.7°F | Ht 69.0 in | Wt 206.0 lb

## 2021-03-17 DIAGNOSIS — Z944 Liver transplant status: Principal | ICD-10-CM

## 2021-03-17 DIAGNOSIS — E612 Magnesium deficiency: Principal | ICD-10-CM

## 2021-03-17 DIAGNOSIS — Z5181 Encounter for therapeutic drug level monitoring: Principal | ICD-10-CM

## 2021-03-17 DIAGNOSIS — M19032 Primary osteoarthritis, left wrist: Secondary | ICD-10-CM | POA: Diagnosis not present

## 2021-03-17 DIAGNOSIS — Q231 Congenital insufficiency of aortic valve: Secondary | ICD-10-CM | POA: Diagnosis not present

## 2021-03-17 DIAGNOSIS — M79642 Pain in left hand: Secondary | ICD-10-CM

## 2021-03-17 DIAGNOSIS — E119 Type 2 diabetes mellitus without complications: Secondary | ICD-10-CM

## 2021-03-17 DIAGNOSIS — M8580 Other specified disorders of bone density and structure, unspecified site: Secondary | ICD-10-CM | POA: Diagnosis not present

## 2021-03-17 LAB — POCT GLYCOSYLATED HEMOGLOBIN (HGB A1C): Hemoglobin A1C: 8.2 % — AB (ref 4.0–5.6)

## 2021-03-17 MED ORDER — METFORMIN HCL ER 500 MG PO TB24: 1000.0000 mg | ORAL_TABLET | Freq: Two times a day (BID) | ORAL | 3 refills | Status: DC

## 2021-03-17 MED ORDER — EMPAGLIFLOZIN 10 MG PO TABS: 10.0000 mg | ORAL_TABLET | Freq: Every day | ORAL | 5 refills | Status: DC

## 2021-03-17 NOTE — Patient Instructions (Addendum)
Please ask the front about getting a record release from [facility].  Go to the lab on the way out.   If you have mychart we'll likely use that to update you.     Start jardiance for your sugar.  Please update me about your  sugar in about 10 days, sooner if needed.   Take care.  Glad to see you.  Plan on recheck in about 3 months but we can make plans after your surgery if needed.

## 2021-03-17 NOTE — Progress Notes (Signed)
This visit occurred during the SARS-CoV-2 public health emergency.  Safety protocols were in place, including screening questions prior to the visit, additional usage of staff PPE, and extensive cleaning of exam room while observing appropriate contact time as indicated for disinfecting solutions.  Diabetes:  Using medications without difficulties: metformin 2 tabs twice a day.   Hypoglycemic episodes: no Hyperglycemic episodes: no Feet problems: no Blood Sugars averaging: occ checked, ~150 eye exam within last year: done about 2 weeks ago, [facility].   A1c d/w pt at OV.    He has had some L hand pain with grip, extensor side pain.  Episodic.  He is L handed.  No pain or swelling now.  No tingling.    He has valve replacement pending.  He can SOB with exertion, not at rest.  This has been progressive.  D/w pt about rationale for bypass with valve replacement.    D/w pt about me managing his fosmax going forward.  He had been on med for 2.5 years so far.   Meds, vitals, and allergies reviewed.  ROS: Per HPI unless specifically indicated in ROS section   GEN: nad, alert and oriented HEENT: ncat NECK: supple w/o LA CV: rrr. SEM noted.   PULM: ctab, no inc wob ABD: soft, +bs EXT: no edema SKIN: no acute rash  31 minutes were devoted to patient care in this encounter (this includes time spent reviewing the patient's file/history, interviewing and examining the patient, counseling/reviewing plan with patient).

## 2021-03-19 DIAGNOSIS — M810 Age-related osteoporosis without current pathological fracture: Principal | ICD-10-CM

## 2021-03-19 DIAGNOSIS — M79642 Pain in left hand: Secondary | ICD-10-CM | POA: Insufficient documentation

## 2021-03-19 DIAGNOSIS — M8580 Other specified disorders of bone density and structure, unspecified site: Secondary | ICD-10-CM | POA: Insufficient documentation

## 2021-03-19 MED ORDER — ALENDRONATE 70 MG TABLET
ORAL_TABLET | ORAL | 2 refills | 28.00000 days | Status: CN
Start: 2021-03-19 — End: 2021-06-17

## 2021-03-19 MED FILL — PRAVASTATIN 40 MG TABLET: ORAL | 90 days supply | Qty: 90 | Fill #3

## 2021-03-19 MED FILL — METFORMIN ER 500 MG TABLET,EXTENDED RELEASE 24 HR: ORAL | 90 days supply | Qty: 360 | Fill #2

## 2021-03-19 MED FILL — AMLODIPINE 10 MG TABLET: ORAL | 90 days supply | Qty: 90 | Fill #1

## 2021-03-19 MED FILL — SERTRALINE 50 MG TABLET: ORAL | 90 days supply | Qty: 90 | Fill #2

## 2021-03-19 MED FILL — TACROLIMUS 1 MG CAPSULE, IMMEDIATE-RELEASE: ORAL | 30 days supply | Qty: 120 | Fill #8

## 2021-03-19 NOTE — Assessment & Plan Note (Signed)
We can recheck after his surgery, we can set that up after the fact. Start jardiance, rationale for use and routine cautions discussed with patient.  I asked him to update me about his sugar in about 10 days, sooner if needed.  He agrees with plan.  Continue metformin.

## 2021-03-19 NOTE — Unmapped (Addendum)
Refill not appropriate. Please send to PCP. Last script was to bridge pt.

## 2021-03-19 NOTE — Assessment & Plan Note (Signed)
D/w pt about me managing his fosmax going forward.  He had been on med for 2.5 years so far.  No change in medication at this point.

## 2021-03-19 NOTE — Assessment & Plan Note (Signed)
I suspect he has arthritic changes in the hand but is reasonable to check plain films.  See notes on imaging.

## 2021-03-19 NOTE — Assessment & Plan Note (Signed)
With valve replacement pending, with shortness of breath on exertion.  We talked about the rationale for bypass with valve replacement concurrently.

## 2021-03-21 ENCOUNTER — Other Ambulatory Visit: Payer: Self-pay | Admitting: Family Medicine

## 2021-03-21 DIAGNOSIS — E039 Hypothyroidism, unspecified: Secondary | ICD-10-CM

## 2021-03-24 ENCOUNTER — Ambulatory Visit: Admit: 2021-03-24 | Discharge: 2021-03-24 | Payer: MEDICARE

## 2021-03-24 DIAGNOSIS — Z5181 Encounter for therapeutic drug level monitoring: Principal | ICD-10-CM

## 2021-03-24 DIAGNOSIS — E612 Magnesium deficiency: Principal | ICD-10-CM

## 2021-03-24 DIAGNOSIS — Z944 Liver transplant status: Principal | ICD-10-CM

## 2021-03-24 DIAGNOSIS — C220 Liver cell carcinoma: Secondary | ICD-10-CM | POA: Diagnosis not present

## 2021-03-24 DIAGNOSIS — I77810 Thoracic aortic ectasia: Secondary | ICD-10-CM | POA: Diagnosis not present

## 2021-03-24 DIAGNOSIS — R161 Splenomegaly, not elsewhere classified: Secondary | ICD-10-CM | POA: Diagnosis not present

## 2021-03-24 DIAGNOSIS — K760 Fatty (change of) liver, not elsewhere classified: Secondary | ICD-10-CM | POA: Diagnosis not present

## 2021-03-24 DIAGNOSIS — K766 Portal hypertension: Secondary | ICD-10-CM | POA: Diagnosis not present

## 2021-03-24 MED ADMIN — gadobenate dimeglumine (MULTIHANCE) 529 mg/mL (0.1mmol/0.2mL) solution 9 mL: 9 mL | INTRAVENOUS | @ 14:00:00 | Stop: 2021-03-24

## 2021-03-25 NOTE — Unmapped (Signed)
Dr. Sherryll Burger reviewed pt's 03/24/21 MRI and mentioned to let patient know there is no HCC. Dr. Sherryll Burger mentioned the concern about this being called fibrosis that would be due to his NASH that was seen on August 2022 biopsy. Dr. Sherryll Burger wants him to work on weight loss, healthy eating, health maintenance.     Talked with patient and mentioned the above information from Dr. Sherryll Burger that he reviewed your MRI and it shows no HCC; mentioned there was concern of fibrosis that would be related to fatty liver seen on his biopsy August 2022. Patient confirmed this was familiar information. Discussed that Dr. Sherryll Burger is encouraging him to work on weight loss, eating healthy, being active and health maintenance.   When offered for him to see dietitian, he mentioned seeing her in Oct/November.   When offered another appt, he mentioned he is having open heart surgery due to one blockage and having his aortic valve replaced next week on Feb 9.   Patient said he has started to eat a little better and he wanted to be able to get through the surgery to determine further resources as needed; he is unsure if he will be in cardiac rehab.     Mentioned this will be routed to his coordinator who is back next week and we will let Dr. Sherryll Burger know about his surgery next week.

## 2021-03-26 DIAGNOSIS — Z8505 Personal history of malignant neoplasm of liver: Principal | ICD-10-CM

## 2021-03-26 DIAGNOSIS — C220 Liver cell carcinoma: Principal | ICD-10-CM

## 2021-03-26 DIAGNOSIS — Z944 Liver transplant status: Principal | ICD-10-CM

## 2021-03-27 NOTE — Unmapped (Signed)
Patient's recent chest CT Reviewed by NP Harms with no concerns (MRI previously reviewed by Dr. Sherryll Burger) - orders entered for repeat hcc surveillance imaging with CT chest and MRI abdomen WITH SEDATION due to patient h/o claustorphobia ~July 2023 and request entered in cehcklist for TPA to schedule.

## 2021-03-31 DIAGNOSIS — Z5181 Encounter for therapeutic drug level monitoring: Principal | ICD-10-CM

## 2021-03-31 DIAGNOSIS — E612 Magnesium deficiency: Principal | ICD-10-CM

## 2021-03-31 DIAGNOSIS — Z944 Liver transplant status: Principal | ICD-10-CM

## 2021-03-31 NOTE — Progress Notes (Signed)
Surgical Instructions    Your procedure is scheduled on Thursday, February 9th, 2023.   Report to [facility] Main Entrance "A" at 05:30 A.M., then check in with the Admitting office.  Call this number if you have problems the morning of surgery:  [phone]   If you have any questions prior to your surgery date call [phone]: Open Monday-Friday 8am-4pm    Remember:  Do not eat or drink after midnight the night before your surgery     Take these medicines the morning of surgery with A SIP OF WATER:   amLODipine (NORVASC)  levothyroxine (SYNTHROID) sertraline (ZOLOFT)  If needed:  acetaminophen (TYLENOL)  albuterol (VENTOLIN HFA)  azelastine (ASTELIN)  fluticasone (FLONASE)   Ask MD if you must hold tacrolimus (PROGRAF) prior your surgery  Follow your surgeon's instructions on when to stop Aspirin and Eliquis.  If no instructions were given by your surgeon then you will need to call the office to get those instructions.     As of today, STOP taking any Aspirin (unless otherwise instructed by your surgeon) Aleve, Naproxen, Ibuprofen, Motrin, Advil, Goody's, BC's, all herbal medications, fish oil, and all vitamins.   WHAT DO I DO ABOUT MY DIABETES MEDICATION?   Do not take empagliflozin (JARDIANCE) the day before surgery and the morning of surgery.  Do not take metFORMIN (GLUCOPHAGE-XR) the morning of surgery   HOW TO MANAGE YOUR DIABETES BEFORE AND AFTER SURGERY  Why is it important to control my blood sugar before and after surgery? Improving blood sugar levels before and after surgery helps healing and can limit problems. A way of improving blood sugar control is eating a healthy diet by:  Eating less sugar and carbohydrates  Increasing activity/exercise  Talking with your doctor about reaching your blood sugar goals High blood sugars (greater than 180 mg/dL) can raise your risk of infections and slow your recovery, so you will need to focus on controlling  your diabetes during the weeks before surgery. Make sure that the doctor who takes care of your diabetes knows about your planned surgery including the date and location.  How do I manage my blood sugar before surgery? Check your blood sugar at least 4 times a day, starting 2 days before surgery, to make sure that the level is not too high or low.  Check your blood sugar the morning of your surgery when you wake up and every 2 hours until you get to the Short Stay unit.  If your blood sugar is less than 70 mg/dL, you will need to treat for low blood sugar: Do not take insulin. Treat a low blood sugar (less than 70 mg/dL) with  cup of clear juice (cranberry or apple), 4 glucose tablets, OR glucose gel. Recheck blood sugar in 15 minutes after treatment (to make sure it is greater than 70 mg/dL). If your blood sugar is not greater than 70 mg/dL on recheck, call [phone] for further instructions. Report your blood sugar to the short stay nurse when you get to Short Stay.  If you are admitted to the hospital after surgery: Your blood sugar will be checked by the staff and you will probably be given insulin after surgery (instead of oral diabetes medicines) to make sure you have good blood sugar levels. The goal for blood sugar control after surgery is 80-180 mg/dL.    The day of surgery:  Do not wear jewelry  Do not wear lotions, powders, colognes, or deodorant. Men may shave face and  neck. Do not bring valuables to the hospital.   [facility] is not responsible for any belongings or valuables. .   Do NOT Smoke (Tobacco/Vaping)  24 hours prior to your procedure  If you use a CPAP at night, you may bring your mask for your overnight stay.   Contacts, glasses, hearing aids, dentures or partials may not be worn into surgery, please bring cases for these belongings   For patients admitted to the hospital, discharge time will be determined by your treatment team.   Patients discharged  the day of surgery will not be allowed to drive home, and someone needs to stay with them for 24 hours.  NO VISITORS WILL BE ALLOWED IN PRE-OP WHERE PATIENTS ARE PREPPED FOR SURGERY.  ONLY 1 SUPPORT PERSON MAY BE PRESENT IN THE WAITING ROOM WHILE YOU ARE IN SURGERY.  IF YOU ARE TO BE ADMITTED, ONCE YOU ARE IN YOUR ROOM YOU WILL BE ALLOWED TWO (2) VISITORS. 1 (ONE) VISITOR MAY STAY OVERNIGHT BUT MUST ARRIVE TO THE ROOM BY 8pm.  Minor children may have two parents present. Special consideration for safety and communication needs will be reviewed on a case by case basis.  Special instructions:    Oral Hygiene is also important to reduce your risk of infection.  Remember - BRUSH YOUR TEETH THE MORNING OF SURGERY WITH YOUR REGULAR TOOTHPASTE   [place]- Preparing For Surgery  Before surgery, you can play an important role. Because skin is not sterile, your skin needs to be as free of germs as possible. You can reduce the number of germs on your skin by washing with CHG (chlorahexidine gluconate) Soap before surgery.  CHG is an antiseptic cleaner which kills germs and bonds with the skin to continue killing germs even after washing.     Please do not use if you have an allergy to CHG or antibacterial soaps. If your skin becomes reddened/irritated stop using the CHG.  Do not shave (including legs and underarms) for at least 48 hours prior to first CHG shower. It is OK to shave your face.  Please follow these instructions carefully.     Shower the NIGHT BEFORE SURGERY and the MORNING OF SURGERY with CHG Soap.   If you chose to wash your hair, wash your hair first as usual with your normal shampoo. After you shampoo, rinse your hair and body thoroughly to remove the shampoo.  Then [employer] and genitals (private parts) with your normal soap and rinse thoroughly to remove soap.  After that Use CHG Soap as you would any other liquid soap. You can apply CHG directly to the skin and wash gently with a  scrungie or a clean washcloth.   Apply the CHG Soap to your body ONLY FROM THE NECK DOWN.  Do not use on open wounds or open sores. Avoid contact with your eyes, ears, mouth and genitals (private parts). Wash Face and genitals (private parts)  with your normal soap.   Wash thoroughly, paying special attention to the area where your surgery will be performed.  Thoroughly rinse your body with warm water from the neck down.  DO NOT shower/wash with your normal soap after using and rinsing off the CHG Soap.  Pat yourself dry with a CLEAN TOWEL.  Wear CLEAN PAJAMAS to bed the night before surgery  Place CLEAN SHEETS on your bed the night before your surgery  DO NOT SLEEP WITH PETS.   Day of Surgery:  Take a shower with  CHG soap. Wear Clean/Comfortable clothing the morning of surgery Do not apply any deodorants/lotions.   Remember to brush your teeth WITH YOUR REGULAR TOOTHPASTE.    COVID testing  If you are going to stay overnight or be admitted after your procedure/surgery and require a pre-op COVID test, please follow these instructions after your COVID test   You are not required to quarantine however you are required to wear a well-fitting mask when you are out and around people not in your household.  If your mask becomes wet or soiled, replace with a new one.  Wash your hands often with soap and water for 20 seconds or clean your hands with an alcohol-based hand sanitizer that contains at least 60% alcohol.  Do not share personal items.  Notify your provider: if you are in close contact with someone who has COVID  or if you develop a fever of 100.4 or greater, sneezing, cough, sore throat, shortness of breath or body aches.    Please read over the following fact sheets that you were given.

## 2021-04-01 ENCOUNTER — Ambulatory Visit (HOSPITAL_COMMUNITY)
Admission: RE | Admit: 2021-04-01 | Discharge: 2021-04-01 | Disposition: A | Discharge status: Home / Self Care | Payer: Medicare Other | Source: Ambulatory Visit | Attending: Surgery | Admitting: Surgery

## 2021-04-01 ENCOUNTER — Encounter (HOSPITAL_COMMUNITY): Payer: Self-pay

## 2021-04-01 ENCOUNTER — Encounter (HOSPITAL_COMMUNITY)
Admission: RE | Admit: 2021-04-01 | Discharge: 2021-04-01 | Disposition: A | Discharge status: Home / Self Care | Payer: Medicare Other | Source: Ambulatory Visit | Attending: Surgery | Admitting: Surgery

## 2021-04-01 ENCOUNTER — Ambulatory Visit (HOSPITAL_BASED_OUTPATIENT_CLINIC_OR_DEPARTMENT_OTHER)
Admission: RE | Admit: 2021-04-01 | Discharge: 2021-04-01 | Disposition: A | Discharge status: Home / Self Care | Payer: Medicare Other | Source: Ambulatory Visit | Attending: Surgery | Admitting: Surgery

## 2021-04-01 ENCOUNTER — Other Ambulatory Visit: Payer: Self-pay

## 2021-04-01 DIAGNOSIS — I110 Hypertensive heart disease with heart failure: Secondary | ICD-10-CM | POA: Diagnosis not present

## 2021-04-01 DIAGNOSIS — I350 Nonrheumatic aortic (valve) stenosis: Secondary | ICD-10-CM

## 2021-04-01 DIAGNOSIS — Z01818 Encounter for other preprocedural examination: Secondary | ICD-10-CM | POA: Diagnosis not present

## 2021-04-01 DIAGNOSIS — Z79890 Hormone replacement therapy (postmenopausal): Secondary | ICD-10-CM | POA: Diagnosis not present

## 2021-04-01 DIAGNOSIS — Z8249 Family history of ischemic heart disease and other diseases of the circulatory system: Secondary | ICD-10-CM | POA: Diagnosis not present

## 2021-04-01 DIAGNOSIS — I10 Essential (primary) hypertension: Secondary | ICD-10-CM | POA: Diagnosis not present

## 2021-04-01 DIAGNOSIS — E039 Hypothyroidism, unspecified: Secondary | ICD-10-CM | POA: Diagnosis not present

## 2021-04-01 DIAGNOSIS — I2510 Atherosclerotic heart disease of native coronary artery without angina pectoris: Secondary | ICD-10-CM

## 2021-04-01 DIAGNOSIS — Z7901 Long term (current) use of anticoagulants: Secondary | ICD-10-CM | POA: Diagnosis not present

## 2021-04-01 DIAGNOSIS — M8580 Other specified disorders of bone density and structure, unspecified site: Secondary | ICD-10-CM | POA: Diagnosis not present

## 2021-04-01 DIAGNOSIS — I358 Other nonrheumatic aortic valve disorders: Secondary | ICD-10-CM | POA: Diagnosis not present

## 2021-04-01 DIAGNOSIS — I340 Nonrheumatic mitral (valve) insufficiency: Secondary | ICD-10-CM | POA: Diagnosis not present

## 2021-04-01 DIAGNOSIS — R918 Other nonspecific abnormal finding of lung field: Secondary | ICD-10-CM | POA: Diagnosis not present

## 2021-04-01 DIAGNOSIS — E7800 Pure hypercholesterolemia, unspecified: Secondary | ICD-10-CM | POA: Diagnosis not present

## 2021-04-01 DIAGNOSIS — D62 Acute posthemorrhagic anemia: Secondary | ICD-10-CM | POA: Diagnosis not present

## 2021-04-01 DIAGNOSIS — Z825 Family history of asthma and other chronic lower respiratory diseases: Secondary | ICD-10-CM | POA: Diagnosis not present

## 2021-04-01 DIAGNOSIS — J9811 Atelectasis: Secondary | ICD-10-CM | POA: Diagnosis not present

## 2021-04-01 DIAGNOSIS — I5032 Chronic diastolic (congestive) heart failure: Secondary | ICD-10-CM | POA: Diagnosis not present

## 2021-04-01 DIAGNOSIS — E119 Type 2 diabetes mellitus without complications: Secondary | ICD-10-CM | POA: Diagnosis not present

## 2021-04-01 DIAGNOSIS — Z7984 Long term (current) use of oral hypoglycemic drugs: Secondary | ICD-10-CM | POA: Diagnosis not present

## 2021-04-01 DIAGNOSIS — Z20822 Contact with and (suspected) exposure to covid-19: Secondary | ICD-10-CM | POA: Diagnosis not present

## 2021-04-01 DIAGNOSIS — I517 Cardiomegaly: Secondary | ICD-10-CM | POA: Diagnosis not present

## 2021-04-01 DIAGNOSIS — Z86711 Personal history of pulmonary embolism: Secondary | ICD-10-CM | POA: Diagnosis not present

## 2021-04-01 DIAGNOSIS — E785 Hyperlipidemia, unspecified: Secondary | ICD-10-CM | POA: Diagnosis not present

## 2021-04-01 DIAGNOSIS — E8770 Fluid overload, unspecified: Secondary | ICD-10-CM | POA: Diagnosis not present

## 2021-04-01 DIAGNOSIS — Z7983 Long term (current) use of bisphosphonates: Secondary | ICD-10-CM | POA: Diagnosis not present

## 2021-04-01 DIAGNOSIS — G4733 Obstructive sleep apnea (adult) (pediatric): Secondary | ICD-10-CM | POA: Diagnosis not present

## 2021-04-01 DIAGNOSIS — Z800 Family history of malignant neoplasm of digestive organs: Secondary | ICD-10-CM | POA: Diagnosis not present

## 2021-04-01 DIAGNOSIS — Z8042 Family history of malignant neoplasm of prostate: Secondary | ICD-10-CM | POA: Diagnosis not present

## 2021-04-01 DIAGNOSIS — Z944 Liver transplant status: Secondary | ICD-10-CM | POA: Diagnosis not present

## 2021-04-01 DIAGNOSIS — Z7982 Long term (current) use of aspirin: Secondary | ICD-10-CM | POA: Diagnosis not present

## 2021-04-01 LAB — COMPREHENSIVE METABOLIC PANEL
ALT: 75 U/L — ABNORMAL HIGH (ref 0–44)
AST: 51 U/L — ABNORMAL HIGH (ref 15–41)
Albumin: 4.3 g/dL (ref 3.5–5.0)
Alkaline Phosphatase: 63 U/L (ref 38–126)
Anion gap: 9 (ref 5–15)
BUN: 22 mg/dL — ABNORMAL HIGH (ref 6–20)
CO2: 21 mmol/L — ABNORMAL LOW (ref 22–32)
Calcium: 10 mg/dL (ref 8.9–10.3)
Chloride: 104 mmol/L (ref 98–111)
Creatinine, Ser: 1.06 mg/dL (ref 0.61–1.24)
GFR, Estimated: >60 mL/min (ref >60)
Glucose, Bld: 132 mg/dL — ABNORMAL HIGH (ref 70–99)
Potassium: 4.7 mmol/L (ref 3.5–5.1)
Sodium: 134 mmol/L — ABNORMAL LOW (ref 135–145)
Total Bilirubin: 0.7 mg/dL (ref 0.3–1.2)
Total Protein: 7.6 g/dL (ref 6.5–8.1)

## 2021-04-01 LAB — URINALYSIS, ROUTINE W REFLEX MICROSCOPIC
Bacteria, UA: NONE SEEN
Bilirubin Urine: NEGATIVE
Glucose, UA: >=500 mg/dL — AB
Hgb urine dipstick: NEGATIVE
Ketones, ur: NEGATIVE mg/dL
Leukocytes,Ua: NEGATIVE
Nitrite: NEGATIVE
Protein, ur: NEGATIVE mg/dL
Specific Gravity, Urine: 1.022 (ref 1.005–1.030)
pH: 5 (ref 5.0–8.0)

## 2021-04-01 LAB — CBC
HCT: 44 % (ref 39.0–52.0)
Hemoglobin: 15.1 g/dL (ref 13.0–17.0)
MCH: 30.5 pg (ref 26.0–34.0)
MCHC: 34.3 g/dL (ref 30.0–36.0)
MCV: 88.9 fL (ref 80.0–100.0)
Platelets: 249 10*3/uL (ref 150–400)
RBC: 4.95 MIL/uL (ref 4.22–5.81)
RDW: 12.6 % (ref 11.5–15.5)
WBC: 10.9 10*3/uL — ABNORMAL HIGH (ref 4.0–10.5)
nRBC: 0 % (ref 0.0–0.2)

## 2021-04-01 LAB — TYPE AND SCREEN
ABO/RH(D): AB POS
Antibody Screen: NEGATIVE

## 2021-04-01 LAB — HEMOGLOBIN A1C
Hgb A1c MFr Bld: 7.4 % — ABNORMAL HIGH (ref 4.8–5.6)
Mean Plasma Glucose: 165.68 mg/dL

## 2021-04-01 LAB — PROTIME-INR
INR: 1 (ref 0.8–1.2)
Prothrombin Time: 13.2 seconds (ref 11.4–15.2)

## 2021-04-01 LAB — SURGICAL PCR SCREEN
MRSA, PCR: POSITIVE — AB
Staphylococcus aureus: POSITIVE — AB

## 2021-04-01 LAB — GLUCOSE, CAPILLARY: Glucose-Capillary: 139 mg/dL — ABNORMAL HIGH (ref 70–99)

## 2021-04-01 LAB — SARS CORONAVIRUS 2 BY RT PCR (HOSPITAL ORDER, PERFORMED IN ~~LOC~~ HOSPITAL LAB): SARS Coronavirus 2: NEGATIVE

## 2021-04-01 LAB — APTT: aPTT: 28 seconds (ref 24–36)

## 2021-04-01 NOTE — Progress Notes (Signed)
PCP - Dr. Elsie Stain Cardiologist - [name] and Dr. Angelena Form  Chest x-ray - 04/01/21 EKG - 04/01/21 Stress Test -  Doppler studies 04/01/21 ECHO - 11/25/20 Cardiac Cath - 12/20/20  Sleep Study - yes CPAP - yes - settings unknown  Fasting Blood Sugar - 130s Checks Blood Sugar 1x per month   Blood Thinner Instructions: Instruct patient to stop taking clopidogrel (Plavix) and other NSAIDs at least 7 days prior to surgery Aspirin Instructions: Follow your surgeon's instructions on when to stop Aspirin.  If no instructions were given by your surgeon then you will need to call the office to get those instructions.    Pt instructed to ask about anti-rejection medications as well.   [facility]col -  PRE-SURGERY Ensure or G2-   COVID TEST- 04/01/21   Anesthesia review: yes   Patient denies shortness of breath, fever, cough and chest pain at PAT appointment   All instructions explained to the patient, with a verbal understanding of the material. Patient agrees to go over the instructions while at home for a better understanding. Patient also instructed to self quarantine after being tested for COVID-19. The opportunity to ask questions was provided.

## 2021-04-02 MED ORDER — PHENYLEPHRINE HCL-NACL 20-0.9 MG/250ML-% IV SOLN
30.0000 ug/min | INTRAVENOUS | Status: DC
  Filled 2021-04-02: qty 250

## 2021-04-02 MED ORDER — PLASMA-LYTE A IV SOLN
INTRAVENOUS | Status: DC
  Filled 2021-04-02: qty 2.5

## 2021-04-02 MED ORDER — CEFAZOLIN SODIUM-DEXTROSE 2-4 GM/100ML-% IV SOLN
2.0000 g | INTRAVENOUS | Status: AC
  Administered 2021-04-03: 2 g via INTRAVENOUS
  Filled 2021-04-02: qty 100

## 2021-04-02 MED ORDER — MANNITOL 20 % IV SOLN
INTRAVENOUS | Status: DC
  Filled 2021-04-02: qty 13

## 2021-04-02 MED ORDER — TRANEXAMIC ACID (OHS) PUMP PRIME SOLUTION
2.0000 mg/kg | INTRAVENOUS | Status: DC
  Filled 2021-04-02: qty 1.86

## 2021-04-02 MED ORDER — TRANEXAMIC ACID 1000 MG/10ML IV SOLN
1.5000 mg/kg/h | INTRAVENOUS | Status: AC
  Administered 2021-04-03: 1.5 mg/kg/h via INTRAVENOUS
  Filled 2021-04-02: qty 25

## 2021-04-02 MED ORDER — TRANEXAMIC ACID (OHS) BOLUS VIA INFUSION
15.0000 mg/kg | INTRAVENOUS | Status: AC
  Administered 2021-04-03: 1392 mg via INTRAVENOUS
  Filled 2021-04-02: qty 1392

## 2021-04-02 MED ORDER — MILRINONE LACTATE IN DEXTROSE 20-5 MG/100ML-% IV SOLN
0.3000 ug/kg/min | INTRAVENOUS | Status: DC
  Filled 2021-04-02: qty 100

## 2021-04-02 MED ORDER — HEPARIN 30,000 UNITS/1000 ML (OHS) CELLSAVER SOLUTION
Status: DC
  Filled 2021-04-02: qty 1000

## 2021-04-02 MED ORDER — NITROGLYCERIN IN D5W 200-5 MCG/ML-% IV SOLN
2.0000 ug/min | INTRAVENOUS | Status: DC
  Filled 2021-04-02: qty 250

## 2021-04-02 MED ORDER — INSULIN REGULAR(HUMAN) IN NACL 100-0.9 UT/100ML-% IV SOLN
INTRAVENOUS | Status: AC
  Administered 2021-04-03: 8 [IU]/h via INTRAVENOUS
  Filled 2021-04-02: qty 100

## 2021-04-02 MED ORDER — NOREPINEPHRINE 4 MG/250ML-% IV SOLN
0.0000 ug/min | INTRAVENOUS | Status: DC
  Filled 2021-04-02: qty 250

## 2021-04-02 MED ORDER — POTASSIUM CHLORIDE 2 MEQ/ML IV SOLN
80.0000 meq | INTRAVENOUS | Status: DC
  Filled 2021-04-02: qty 40

## 2021-04-02 MED ORDER — VANCOMYCIN HCL 1500 MG/300ML IV SOLN
1500.0000 mg | INTRAVENOUS | Status: AC
  Administered 2021-04-03: 1500 mg via INTRAVENOUS
  Filled 2021-04-02: qty 300

## 2021-04-02 MED ORDER — EPINEPHRINE HCL 5 MG/250ML IV SOLN IN NS
0.0000 ug/min | INTRAVENOUS | Status: DC
  Filled 2021-04-02: qty 250

## 2021-04-02 MED ORDER — DEXMEDETOMIDINE HCL IN NACL 400 MCG/100ML IV SOLN
0.1000 ug/kg/h | INTRAVENOUS | Status: AC
  Administered 2021-04-03: .4 ug/kg/h via INTRAVENOUS
  Filled 2021-04-02: qty 100

## 2021-04-02 NOTE — Anesthesia Preprocedure Evaluation (Addendum)
Anesthesia Evaluation  Patient identified by MRN, date of birth, ID band Patient awake    Reviewed: Allergy & Precautions, NPO status , Patient's Chart, lab work & pertinent test results  Airway Mallampati: II  TM Distance: >3 FB Neck ROM: Full    Dental  (+) Teeth Intact, Dental Advisory Given, Chipped,    Pulmonary asthma , sleep apnea and Continuous Positive Airway Pressure Ventilation ,    Pulmonary exam normal breath sounds clear to auscultation       Cardiovascular hypertension, Pt. on medications + CAD  Normal cardiovascular exam+ Valvular Problems/Murmurs (Bicuspid AV with severe AS) AS  Rhythm:Regular Rate:Normal + Systolic murmurs Echo 10/26/24: 1. Left ventricular ejection fraction, by estimation, is 60 to 65%. The  left ventricle has normal function. The left ventricle has no regional  wall motion abnormalities. There is moderate left ventricular hypertrophy.  Left ventricular diastolic  parameters were normal.  2. Right ventricular systolic function is normal. The right ventricular  size is normal.  3. Left atrial size was mildly dilated.  4. The mitral valve is abnormal. Trivial mitral valve regurgitation. No  evidence of mitral stenosis. Moderate mitral annular calcification.  5. Gradients lower than TTE done 06/18/20 DVI lower prevoiusly 0.31 AVA  similar Fused right and left cusps. The aortic valve is calcified. There  is severe calcifcation of the aortic valve. There is severe thickening of  the aortic valve. Aortic valve  regurgitation is not visualized. Moderate to severe aortic valve stenosis.  6. There is moderate dilatation of the aortic root, measuring 42 mm.  7. The inferior vena cava is normal in size with greater than 50%  respiratory variability, suggesting right atrial pressure of 3 mmHg.   Cath 12/20/20:   Prox RCA lesion is 20% stenosed.   Mid RCA to Dist RCA lesion is 20% stenosed.    RPDA lesion is 20% stenosed.   Mid LAD lesion is 80% stenosed.  1.Severe mid LAD stenosis 2.Mild non-obstructive disease in the RCA and Circumflex 3.Severe aortic stenosis (mean gradient 50.6 mmHg, peak to peak gradient 66 mmHg, AVA 0.78 cm2).     Neuro/Psych  Headaches, PSYCHIATRIC DISORDERS Anxiety    GI/Hepatic GERD  ,S/p Liver transplant    Endo/Other  diabetes, Type 2, Oral Hypoglycemic AgentsHypothyroidism Obesity   Renal/GU negative Renal ROS     Musculoskeletal negative musculoskeletal ROS (+)   Abdominal   Peds  Hematology  (+) Blood dyscrasia (Eliquis), ,   Anesthesia Other Findings   Reproductive/Obstetrics                            Anesthesia Physical Anesthesia Plan  ASA: 4  Anesthesia Plan: General   Post-op Pain Management:    Induction: Intravenous  PONV Risk Score and Plan: 2 and Treatment may vary due to age or medical condition and Midazolam  Airway Management Planned: Oral ETT  Additional Equipment: Arterial line, CVP, PA Cath, TEE and Ultrasound Guidance Line Placement  Intra-op Plan:   Post-operative Plan: Post-operative intubation/ventilation  Informed Consent: I have reviewed the patients History and Physical, chart, labs and discussed the procedure including the risks, benefits and alternatives for the proposed anesthesia with the patient or authorized representative who has indicated his/her understanding and acceptance.     Dental advisory given  Plan Discussed with: CRNA  Anesthesia Plan Comments:        Anesthesia Quick Evaluation

## 2021-04-02 NOTE — H&P (Signed)
[address]       [name] [address]             [phone]      Cardiothoracic Surgery Admission History and Physical   PCP is [name], MD Referring Provider is [name]*       Chief Complaint  Patient presents with   Coronary Artery Disease            HPI:   The patient is a 61 year old gentleman with history of diabetes, hypertension, cirrhosis of the liver status post liver transplant on 09/16/2018 at [facility], OSA on CPAP, coronary artery disease, and bicuspid aortic valve disease with severe stenosis who has been followed by [name] and was referred to the structural heart valve clinic for treatment of his aortic stenosis.  He had mild coronary disease by catheterization 2020 prior to his transplant.  He has been followed for moderate aortic stenosis.  His most recent echocardiogram on 11/25/2020 showed a thickened and calcified aortic valve with restricted leaflet mobility.  The mean gradient was 31 mmHg with a peak gradient of 54 mmHg.  Stroke-volume index was 32.  Aortic valve area was 0.8 cm with a dimensionless index of 0.22 consistent with severe aortic stenosis.  Left ventricular ejection fraction was 60 to 65% with moderate LVH.   He reports progressive exertional shortness of breath and fatigue.  He has had no chest pain or pressure.  He denies any peripheral edema.  He has had intermittent dizziness for several years.   He was being worked up for consideration of TAVR and CTA of the chest showed multiple nonobstructive pulmonary emboli bilaterally.  He therefore underwent a dedicated PE protocol CT which showed no central pulmonary emboli.  He was started on Eliquis with a plan for 3 months of anticoagulation.   The CT scan also showed signs of hepatic cirrhosis with a hypervascular area of concern peripherally within segment 5.  It was recommended that he have an abdominal MRI with and without IV gadolinium to rule out  neoplasm.  This has apparently been scheduled through his transplant physician at [facility] Dr. [name].       Past Medical History:  Diagnosis Date   Allergy     Asthma     Bicuspid aortic valve      sees dr Harrington Challenger   Cirrhosis [facility])     Diabetes mellitus without complication ([place])     Fatty liver      with h/o elevated LFT's   GERD (gastroesophageal reflux disease)     Heart murmur     Hypertension     Itching      all over last few months   Jaundice     Liver transplant recipient ([name])      09/16/2018 at [facility]   Migraine with aura     OSA (obstructive sleep apnea) 09/14/2011    PSG 11/08/11>>AHI 31.6, SpO2 low 85%. wears CPAP, pt does not know settings   Other abnormal glucose      diet controlled diabetic   Thyroid disease             Past Surgical History:  Procedure Laterality Date   APPENDECTOMY   10/09    Emergency   BIOPSY THYROID   08/19/07    Attempted, no tissue obtained   CARDIAC CATHETERIZATION   03/21/2018   CARDIOVASCULAR STRESS TEST   03/07    Negative 06/05  DOPPLER ECHOCARDIOGRAPHY   06/04   ESOPHAGEAL BANDING N/A 05/04/2016    Procedure: ESOPHAGEAL BANDING;  Surgeon: [name], MD;  Location: [facility];  Service: Endoscopy;  Laterality: N/A;   ESOPHAGOGASTRODUODENOSCOPY (EGD) WITH PROPOFOL N/A 05/04/2016    Procedure: ESOPHAGOGASTRODUODENOSCOPY (EGD) WITH PROPOFOL;  Surgeon: [name], MD;  Location: [facility];  Service: Endoscopy;  Laterality: N/A;   LIVER TRANSPLANT        09/16/2018 at [facility]   RIGHT/LEFT HEART CATH AND CORONARY ANGIOGRAPHY N/A 12/20/2020    Procedure: RIGHT/LEFT HEART CATH AND CORONARY ANGIOGRAPHY;  Surgeon: [name], MD;  Location: [facility];  Service: Cardiovascular;  Laterality: N/A;           Family History  Problem Relation Age of Onset   Asthma Mother     Cancer Father          Died when pt was 46 of CA with mets, site unknown, COPD   COPD Father     Heart disease Sister          Heart  stopped   Liver disease Sister          "gene" for liver disease   Colon cancer Maternal Grandmother     Heart disease Maternal Grandfather          MI, 61 YOA   Prostate cancer Maternal Uncle     Esophageal cancer Neg Hx     Rectal cancer Neg Hx     Stomach cancer Neg Hx        Social History Social History        Tobacco Use   Smoking status: Never   Smokeless tobacco: Never  Vaping Use   Vaping Use: Never used  Substance Use Topics   Alcohol use: No   Drug use: No            Current Outpatient Medications  Medication Sig Dispense Refill   acetaminophen (TYLENOL) 500 MG tablet Take 500 mg by mouth every 6 (six) hours as needed for moderate pain or headache.       albuterol (VENTOLIN HFA) 108 (90 Base) MCG/ACT inhaler Inhale 2 puffs into the lungs every 6 (six) hours as needed for wheezing or shortness of breath.       alendronate (FOSAMAX) 70 MG tablet Take 70 mg by mouth every Sunday. Take with a full glass of water on an empty stomach.       amLODipine (NORVASC) 10 MG tablet Take 1 tablet (10 mg total) by mouth in the morning. 90 tablet 3   apixaban (ELIQUIS) 5 MG TABS tablet Take 1 tablet (5 mg total) by mouth 2 (two) times daily. 60 tablet 3   aspirin EC 81 MG tablet Take 81 mg by mouth daily.       azelastine (ASTELIN) 0.1 % nasal spray PLACE 2 SPRAYS INTO BOTH NOSTRILS TWICE DAILY AS NEEDED 30 mL 1   calcium carbonate (TUMS - DOSED IN MG ELEMENTAL CALCIUM) 500 MG chewable tablet Chew 1 tablet by mouth daily.       Cholecalciferol (VITAMIN D) 50 MCG (2000 UT) tablet Take 2,000 Units by mouth daily.       fluticasone (FLONASE) 50 MCG/ACT nasal spray PLACE ONE OR TWO SPRAYS INTO BOTH NOSTRILS DAILY AS NEEDED. 48 g 3   levothyroxine (SYNTHROID) 150 MCG tablet Take 1 tablet (150 mcg total) by mouth daily before breakfast.       metFORMIN (GLUCOPHAGE-XR) 500 MG  24 hr tablet Take 2 tablets (1,000 mg total) by mouth in the morning and at bedtime. 360 tablet 3   metroNIDAZOLE  (METROGEL) 1 % gel Apply 1 application topically daily as needed (rosacea).       mycophenolate (CELLCEPT) 250 MG capsule Take 250 mg by mouth 2 (two) times daily.       OneTouch Delica Lancets 73Z MISC USE TO CHECK SUGAR DAILY 100 each 3   ONETOUCH ULTRA test strip USE TO CHECK SUGAR DAILY 100 strip 0   pravastatin (PRAVACHOL) 40 MG tablet Take 40 mg by mouth every evening.       sertraline (ZOLOFT) 50 MG tablet Take 1 tablet (50 mg total) by mouth daily. 90 tablet 3   tacrolimus (PROGRAF) 1 MG capsule Take 2 mg by mouth 2 (two) times daily.        No current facility-administered medications for this visit.           Allergies  Allergen Reactions   Watermelon Flavor        Mouth itching      Review of Systems  Constitutional:  Positive for activity change and fatigue.  HENT:  Positive for hearing loss. Negative for dental problem.   Eyes: Negative.   Respiratory:  Positive for apnea and shortness of breath.        Uses CPAP  Cardiovascular:  Negative for chest pain and leg swelling.  Gastrointestinal: Negative.   Endocrine: Negative.   Genitourinary: Negative.   Musculoskeletal: Negative.   Skin: Negative.   Allergic/Immunologic: Positive for immunocompromised state.       For liver transplant  Neurological:  Positive for dizziness.  Hematological: Negative.   Psychiatric/Behavioral: Negative.      BP 129/88 (BP Location: Left Arm, Patient Position: Sitting)    Pulse 77    Resp 20    Ht 5\' 9"  (1.753 m)    Wt 205 lb (93 kg)    SpO2 96% Comment: RA   BMI 30.27 kg/m  Physical Exam Constitutional:      Appearance: Normal appearance. He is normal weight.  HENT:     Head: Normocephalic and atraumatic.     Mouth/Throat:     Mouth: Mucous membranes are moist.     Pharynx: Oropharynx is clear.  Eyes:     Extraocular Movements: Extraocular movements intact.     Pupils: Pupils are equal, round, and reactive to light.  Cardiovascular:     Rate and Rhythm: Normal rate and  regular rhythm.     Pulses: Normal pulses.     Heart sounds: Murmur heard.     Comments: 3/6 systolic murmur RSB, no diastolic murmur Pulmonary:     Effort: Pulmonary effort is normal.     Breath sounds: Normal breath sounds.  Abdominal:     General: Bowel sounds are normal. There is no distension.     Tenderness: There is no abdominal tenderness.  Musculoskeletal:        General: No swelling.     Cervical back: Normal range of motion and neck supple.  Skin:    General: Skin is warm and dry.     Coloration: Skin is not jaundiced.  Neurological:     General: No focal deficit present.     Mental Status: He is alert and oriented to person, place, and time.  Psychiatric:        Mood and Affect: Mood normal.        Behavior: Behavior normal.  Diagnostic Tests:   ECHOCARDIOGRAM REPORT         Patient Name:   [name] Date of Exam: 11/25/2020  Medical Rec #:  [id number]      Height:       69.0 in  Accession #:    [id number]     Weight:       209.0 lb  Date of Birth:  1960-09-02      BSA:          2.105 m  Patient Age:    61 years       BP:           124/82 mmHg  Patient Gender: M              HR:           62 bpm.  Exam Location:  [place]   Procedure: 2D Echo, Cardiac Doppler and Color Doppler   Indications:    I35.0 AS     History:        Patient has prior history of Echocardiogram examinations,  most                  recent 06/18/2020. AS; Risk Factors:Hypertension and                  Dyslipidemia.     Sonographer:    Coralyn Helling RDCS  Referring Phys: [id number] PAULA V ROSS   IMPRESSIONS     1. Left ventricular ejection fraction, by estimation, is 60 to 65%. The  left ventricle has normal function. The left ventricle has no regional  wall motion abnormalities. There is moderate left ventricular hypertrophy.  Left ventricular diastolic  parameters were normal.   2. Right ventricular systolic function is normal. The right ventricular  size is  normal.   3. Left atrial size was mildly dilated.   4. The mitral valve is abnormal. Trivial mitral valve regurgitation. No  evidence of mitral stenosis. Moderate mitral annular calcification.   5. Gradients lower than TTE done 06/18/20 DVI lower prevoiusly 0.31 AVA  similar Fused right and left cusps. The aortic valve is calcified. There  is severe calcifcation of the aortic valve. There is severe thickening of  the aortic valve. Aortic valve  regurgitation is not visualized. Moderate to severe aortic valve stenosis.   6. There is moderate dilatation of the aortic root, measuring 42 mm.   7. The inferior vena cava is normal in size with greater than 50%  respiratory variability, suggesting right atrial pressure of 3 mmHg.   FINDINGS   Left Ventricle: Left ventricular ejection fraction, by estimation, is 60  to 65%. The left ventricle has normal function. The left ventricle has no  regional wall motion abnormalities. The left ventricular internal cavity  size was normal in size. There is   moderate left ventricular hypertrophy. Left ventricular diastolic  parameters were normal.   Right Ventricle: The right ventricular size is normal. No increase in  right ventricular wall thickness. Right ventricular systolic function is  normal.   Left Atrium: Left atrial size was mildly dilated.   Right Atrium: Right atrial size was normal in size.   Pericardium: There is no evidence of pericardial effusion.   Mitral Valve: The mitral valve is abnormal. There is mild thickening of  the mitral valve leaflet(s). There is mild calcification of the mitral  valve leaflet(s). Moderate mitral annular calcification. Trivial mitral  valve regurgitation. No evidence of  mitral valve stenosis.   Tricuspid Valve: The tricuspid valve is normal in structure. Tricuspid  valve regurgitation is not demonstrated. No evidence of tricuspid  stenosis.   Aortic Valve: Gradients lower than TTE done 06/18/20 DVI  lower prevoiusly  0.31 AVA similar Fused right and left cusps. The aortic valve is  calcified. There is severe calcifcation of the aortic valve. There is  severe thickening of the aortic valve. Aortic  valve regurgitation is not visualized. Moderate to severe aortic stenosis  is present. Aortic valve mean gradient measures 31.0 mmHg. Aortic valve  peak gradient measures 53.9 mmHg. Aortic valve area, by VTI measures 0.82  cm.   Pulmonic Valve: The pulmonic valve was normal in structure. Pulmonic valve  regurgitation is not visualized. No evidence of pulmonic stenosis.   Aorta: The ascending aorta was not well visualized. There is moderate  dilatation of the aortic root, measuring 42 mm.   Venous: The inferior vena cava is normal in size with greater than 50%  respiratory variability, suggesting right atrial pressure of 3 mmHg.   IAS/Shunts: No atrial level shunt detected by color flow Doppler.      LEFT VENTRICLE  PLAX 2D  LVIDd:         4.70 cm  Diastology  LVIDs:         3.30 cm  LV e' medial:    5.77 cm/s  LV PW:         1.10 cm  LV E/e' medial:  16.6  LV IVS:        1.60 cm  LV e' lateral:   9.57 cm/s  LVOT diam:     2.20 cm  LV E/e' lateral: 10.0  LV SV:         67  LV SV Index:   32  LVOT Area:     3.80 cm      LEFT ATRIUM             Index       RIGHT ATRIUM           Index  LA diam:        4.00 cm 1.90 cm/m  RA Pressure: 3.00 mmHg  LA Vol (A2C):   67.1 ml 31.88 ml/m RA Area:     19.30 cm  LA Vol (A4C):   48.4 ml 22.99 ml/m RA Volume:   58.70 ml  27.89 ml/m  LA Biplane Vol: 62.3 ml 29.60 ml/m   AORTIC VALVE  AV Area (Vmax):    0.81 cm  AV Area (Vmean):   0.80 cm  AV Area (VTI):     0.82 cm  AV Vmax:           367.00 cm/s  AV Vmean:          259.500 cm/s  AV VTI:            0.822 m  AV Peak Grad:      53.9 mmHg  AV Mean Grad:      31.0 mmHg  LVOT Vmax:         78.20 cm/s  LVOT Vmean:        54.600 cm/s  LVOT VTI:          0.177 m  LVOT/AV VTI ratio:  0.22     AORTA  Ao Root diam: 4.20 cm  Ao Asc diam:  3.80 cm   MITRAL VALVE  TRICUSPID VALVE  MV Area (PHT): 2.24 cm     Estimated RAP:  3.00 mmHg  MV Decel Time: 338 msec  MV E velocity: 95.90 cm/s   SHUNTS  MV A velocity: 113.00 cm/s  Systemic VTI:  0.18 m  MV E/A ratio:  0.85         Systemic Diam: 2.20 cm   Jenkins Rouge MD  Electronically signed by Jenkins Rouge MD  Signature Date/Time: 11/25/2020/9:39:22 AM         Final       Physicians   Panel Physicians Referring Physician Case Authorizing Physician  [name], MD (Primary)        Procedures   RIGHT/LEFT HEART CATH AND CORONARY ANGIOGRAPHY    Conclusion       Prox RCA lesion is 20% stenosed.   Mid RCA to Dist RCA lesion is 20% stenosed.   RPDA lesion is 20% stenosed.   Mid LAD lesion is 80% stenosed.   Severe mid LAD stenosis Mild non-obstructive disease in the RCA and Circumflex Severe aortic stenosis (mean gradient 50.6 mmHg, peak to peak gradient 66 mmHg, AVA 0.78 cm2).    Recommendations: He has a severe mid LAD stenosis (not critical) and severe AS with bicuspid aortic valve. Ideally he would be treated with single vessel CABG (LIMA to LAD) and surgical AVR given his young age and given the pathology associated with bicuspid aortic valves. Given his prior liver transplantation, he will be higher risk for CABG/AVR. If he is not felt to be a good candidate for surgery, will plan PCI of the LAD followed by TAVR. Will review with the structural heart team and plan for him to see Dr. Cyndia Bent in the CT surgery office following his CT scans.    Indications   Coronary artery disease involving native coronary artery of native heart without angina pectoris [I25.10 (ICD-10-CM)]  Severe aortic stenosis [I35.0 (ICD-10-CM)]    Procedural Details   Technical Details Indication: 61 yo male with history of liver transplant, bicuspid AV now with severe AS, CAD. Planning for TAVR vs  AVR  Procedure: The risks, benefits, complications, treatment options, and expected outcomes were discussed with the patient. The patient and/or family concurred with the proposed plan, giving informed consent. The patient was brought to the cath lab after IV hydration was given. The patient was sedated with Versed and Fentanyl. The IV catheter in the right antecubital vein was changed for a 5 [place] sheath. Right heart catheterization performed with a balloon tipped catheter. The right wrist was prepped and draped in a sterile fashion. 1% lidocaine was used for local anesthesia. Using the modified Seldinger access technique, a 5 French sheath was placed in the right radial artery. 3 mg Verapamil was given through the sheath. 4000 units IV heparin was given. Standard diagnostic catheters were used to perform selective coronary angiography. I crossed the aortic valve with the JR4 catheter and a J wire. The sheath was removed from the right radial artery and a Terumo hemostasis band was applied at the arteriotomy site on the right wrist.      Estimated blood loss <50 mL.   During this procedure medications were administered to achieve and maintain moderate conscious sedation while the patient's heart rate, blood pressure, and oxygen saturation were continuously monitored and I was present face-to-face 100% of this time.    Medications (Filter: Administrations occurring from 1114 to 1216 on 12/20/20) fentaNYL (SUBLIMAZE) injection (mcg) Total dose:  50 mcg Date/Time  Rate/Dose/Volume Action    12/20/20 1130 50 mcg Given      midazolam (VERSED) injection (mg) Total dose:  2 mg Date/Time Rate/Dose/Volume Action    12/20/20 1130 2 mg Given      Heparin (Porcine) in NaCl 1000-0.9 UT/500ML-% SOLN (mL) Total volume:  1,000 mL Date/Time Rate/Dose/Volume Action    12/20/20 1130 500 mL Given    1130 500 mL Given      lidocaine (PF) (XYLOCAINE) 1 % injection (mL) Total volume:  4 mL Date/Time  Rate/Dose/Volume Action    12/20/20 1139 4 mL Given      Radial Cocktail/Verapamil only (mL) Total volume:  10 mL Date/Time Rate/Dose/Volume Action    12/20/20 1142 10 mL Given      heparin sodium (porcine) injection (Units) Total dose:  4,000 Units Date/Time Rate/Dose/Volume Action    12/20/20 1152 4,000 Units Given      iohexol (OMNIPAQUE) 350 MG/ML injection (mL) Total volume:  45 mL Date/Time Rate/Dose/Volume Action    12/20/20 1206 45 mL Given      Sedation Time   Sedation Time Physician-1: 34 minutes 22 seconds Contrast   Medication Name Total Dose  iohexol (OMNIPAQUE) 350 MG/ML injection 45 mL    Radiation/Fluoro   Fluoro time: 6.3 (min) DAP: 20821 (mGycm2) Cumulative Air Kerma: 378 (mGy) Coronary Findings   Diagnostic Dominance: Right Left Anterior Descending  Mid LAD lesion is 80% stenosed.    Right Coronary Artery  Prox RCA lesion is 20% stenosed.  Mid RCA to Dist RCA lesion is 20% stenosed.    Right Posterior Descending Artery  RPDA lesion is 20% stenosed.    Intervention    No interventions have been documented.    Coronary Diagrams   Diagnostic Dominance: Right Intervention   Implants      No implant documentation for this case.    Syngo Images    Show images for CARDIAC CATHETERIZATION Images on Long Term Storage    Show images for Kota, Ciancio to Procedure Log   Procedure Log    Hemo Data   Flowsheet Row Most Recent Value  Fick Cardiac Output 5.37 L/min  Fick Cardiac Output Index 2.56 (L/min)/BSA  Aortic Mean Gradient 50.58 mmHg  Aortic Peak Gradient 66 mmHg  Aortic Valve Area 0.78  Aortic Value Area Index 0.37 cm2/BSA  RA A Wave 3 mmHg  RA V Wave 2 mmHg  RA Mean 2 mmHg  RV Systolic Pressure 22 mmHg  RV Diastolic Pressure 2 mmHg  RV EDP 4 mmHg  PA Systolic Pressure 23 mmHg  PA Diastolic Pressure 8 mmHg  PA Mean 16 mmHg  PW A Wave 9 mmHg  PW V Wave 10 mmHg  PW Mean 8 mmHg  AO Systolic Pressure 093 mmHg  AO  Diastolic Pressure 80 mmHg  AO Mean 818 mmHg  LV Systolic Pressure 299 mmHg  LV Diastolic Pressure 3 mmHg  LV EDP 7 mmHg  AOp Systolic Pressure 371 mmHg  AOp Diastolic Pressure 79 mmHg  AOp Mean Pressure 97 mmHg  LVp Systolic Pressure 696 mmHg  LVp Diastolic Pressure 3 mmHg  LVp EDP Pressure 7 mmHg  QP/QS 1  TPVR Index 6.25 HRUI  TSVR Index 39.06 HRUI  PVR SVR Ratio 0.08  TPVR/TSVR Ratio 0.16      ADDENDUM REPORT: 01/01/2021 12:54   CLINICAL DATA:  Aortic stenosis   EXAM: Cardiac TAVR CT   TECHNIQUE: The patient was scanned on a Siemens Force 789 slice scanner. A 120  kV retrospective scan was triggered in the descending thoracic aorta at 111 HU's. Gantry rotation speed was 270 msecs and collimation was .9 mm. No beta blockade or nitro were given. The 3D data set was reconstructed in 5% intervals of the R-R cycle. Systolic and diastolic phases were analyzed on a dedicated work station using MPR, MIP and VRT modes. The patient received 80 cc of contrast.   FINDINGS: Aortic Valve: Bicuspid AV Sievers type 1 with fused right and left cusps Calcium score 4920   Aorta: Normal arch vessels no coarctation mild calcific atherosclerosis   Sinotubular Junction: 30 mm   Ascending Thoracic Aorta: 40 mm   Aortic Arch: 30 mm   Descending Thoracic Aorta: 24 mm   Sinus of Valsalva Measurements:   Long Axis: 41.5 mm   Short Axis 33.5 mm   Coronary Artery Height above Annulus:   Left Main: 14.4 mm above annulus   Right Coronary: 16.7 mm above annulus   Virtual Basal Annulus Measurements:   Maximum/Minimum Diameter: 38 mm x 28 mm   Perimeter: 108 mm   Area: 895 mm2   Coronary Arteries: Sufficient height above annulus for deployment   Optimum Fluoroscopic Angle for Delivery: LAO 5 Cranial 5 degrees   IMPRESSION: 1.  Sievers type 1 bicuspid AV with calcium score 4920   2. Annular area 895 mm2 too large for currently available TAVR valves   3.  Dilated aortic  root 4.0 cm   4.  Coronary arteries sufficient height above annulus for deployment   5. Optimum angiographic angle for deployment LAO 5 Cranial 5 degrees   Jenkins Rouge     Electronically Signed   By: Jenkins Rouge M.D.   On: 01/01/2021 12:54    Addended by Josue Hector, MD on 01/01/2021 12:56 PM    Study Result   Narrative & Impression  EXAM: OVER-READ INTERPRETATION  CT CHEST   The following report is an over-read performed by radiologist Dr. Vinnie Langton of [facility], [place] on 01/01/2021. This over-read does not include interpretation of cardiac or coronary anatomy or pathology. The coronary calcium score/coronary CTA interpretation by the cardiologist is attached.   COMPARISON:  None.   FINDINGS: Extracardiac findings will be described separately under dictation for contemporaneously obtained CTA chest, abdomen and pelvis.   IMPRESSION: Please see separate dictation for contemporaneously obtained CTA chest, abdomen and pelvis dated 01/01/2021 for full description of relevant extracardiac findings.   Electronically Signed: By: Vinnie Langton M.D. On: 01/01/2021 12:23    Impression:   This 61 year old gentleman has a Scientist, research (physical sciences) type I bicuspid aortic valve with severe calcification and thickening and stage D, severe, symptomatic aortic stenosis with New York Heart Association class ll symptoms of exertional fatigue and shortness of breath consistent with chronic diastolic congestive heart failure.  His cardiac catheterization shows an 80% proximal to mid LAD stenosis.  His gated cardiac CTA showed an annular area of 895 mm which is too large for TAVR.  Given his young age and the fact that his aortic annular area is too large for TAVR I think that open surgical aortic valve replacement and bypass of the LAD would be the best treatment for him.  He will need to complete his 37-month course of Eliquis for bilateral pulmonary emboli.  Then his Eliquis can be  stopped at least 5 days prior to surgery.  I reviewed the cardiac catheterization and echo images with him and answered all of his questions. I discussed the  operative procedure with the patient and family including alternatives, benefits and risks; including but not limited to bleeding, blood transfusion, infection, stroke, myocardial infarction, graft failure, heart block requiring a permanent pacemaker, organ dysfunction, and death.  Danelle Berry understands and agrees to proceed.      Plan:   Aortic valve replacement and coronary bypass graft surgery using a bioprosthetic valve.       Gaye Pollack, MD [facility] [phone]

## 2021-04-03 ENCOUNTER — Inpatient Hospital Stay (HOSPITAL_COMMUNITY): Payer: Medicare Other | Admitting: Physician Assistant

## 2021-04-03 ENCOUNTER — Inpatient Hospital Stay (HOSPITAL_COMMUNITY): Payer: Medicare Other | Admitting: Anesthesiology

## 2021-04-03 ENCOUNTER — Other Ambulatory Visit: Payer: Self-pay

## 2021-04-03 ENCOUNTER — Encounter (HOSPITAL_COMMUNITY): Payer: Self-pay | Admitting: Surgery

## 2021-04-03 ENCOUNTER — Inpatient Hospital Stay (HOSPITAL_COMMUNITY)
Admission: RE | Admit: 2021-04-03 | Discharge: 2021-04-07 | DRG: 220 | Disposition: A | Discharge status: Home Health | Payer: Medicare Other | Attending: Surgery | Admitting: Surgery

## 2021-04-03 ENCOUNTER — Inpatient Hospital Stay (HOSPITAL_COMMUNITY): Payer: Medicare Other

## 2021-04-03 ENCOUNTER — Encounter (HOSPITAL_COMMUNITY)
Admission: RE | Disposition: A | Discharge status: Home Health | Payer: Self-pay | Source: Home / Self Care | Attending: Surgery

## 2021-04-03 DIAGNOSIS — D62 Acute posthemorrhagic anemia: Secondary | ICD-10-CM | POA: Diagnosis not present

## 2021-04-03 DIAGNOSIS — Z86711 Personal history of pulmonary embolism: Secondary | ICD-10-CM

## 2021-04-03 DIAGNOSIS — D72829 Elevated white blood cell count, unspecified: Secondary | ICD-10-CM | POA: Diagnosis not present

## 2021-04-03 DIAGNOSIS — Z7984 Long term (current) use of oral hypoglycemic drugs: Secondary | ICD-10-CM

## 2021-04-03 DIAGNOSIS — Z91018 Allergy to other foods: Secondary | ICD-10-CM

## 2021-04-03 DIAGNOSIS — I10 Essential (primary) hypertension: Secondary | ICD-10-CM

## 2021-04-03 DIAGNOSIS — E785 Hyperlipidemia, unspecified: Secondary | ICD-10-CM | POA: Diagnosis present

## 2021-04-03 DIAGNOSIS — Z79624 Long term (current) use of inhibitors of nucleotide synthesis: Secondary | ICD-10-CM

## 2021-04-03 DIAGNOSIS — M8580 Other specified disorders of bone density and structure, unspecified site: Secondary | ICD-10-CM | POA: Diagnosis present

## 2021-04-03 DIAGNOSIS — E039 Hypothyroidism, unspecified: Secondary | ICD-10-CM | POA: Diagnosis present

## 2021-04-03 DIAGNOSIS — I110 Hypertensive heart disease with heart failure: Secondary | ICD-10-CM | POA: Diagnosis present

## 2021-04-03 DIAGNOSIS — Z8249 Family history of ischemic heart disease and other diseases of the circulatory system: Secondary | ICD-10-CM

## 2021-04-03 DIAGNOSIS — Z8042 Family history of malignant neoplasm of prostate: Secondary | ICD-10-CM | POA: Diagnosis not present

## 2021-04-03 DIAGNOSIS — Z20822 Contact with and (suspected) exposure to covid-19: Secondary | ICD-10-CM | POA: Diagnosis present

## 2021-04-03 DIAGNOSIS — Z951 Presence of aortocoronary bypass graft: Secondary | ICD-10-CM

## 2021-04-03 DIAGNOSIS — I350 Nonrheumatic aortic (valve) stenosis: Principal | ICD-10-CM

## 2021-04-03 DIAGNOSIS — Z825 Family history of asthma and other chronic lower respiratory diseases: Secondary | ICD-10-CM | POA: Diagnosis not present

## 2021-04-03 DIAGNOSIS — Z7901 Long term (current) use of anticoagulants: Secondary | ICD-10-CM | POA: Diagnosis not present

## 2021-04-03 DIAGNOSIS — Z79621 Long term (current) use of calcineurin inhibitor: Secondary | ICD-10-CM

## 2021-04-03 DIAGNOSIS — I5032 Chronic diastolic (congestive) heart failure: Secondary | ICD-10-CM | POA: Diagnosis present

## 2021-04-03 DIAGNOSIS — E7800 Pure hypercholesterolemia, unspecified: Secondary | ICD-10-CM | POA: Diagnosis present

## 2021-04-03 DIAGNOSIS — E8770 Fluid overload, unspecified: Secondary | ICD-10-CM | POA: Diagnosis not present

## 2021-04-03 DIAGNOSIS — Z944 Liver transplant status: Secondary | ICD-10-CM

## 2021-04-03 DIAGNOSIS — Z7983 Long term (current) use of bisphosphonates: Secondary | ICD-10-CM | POA: Diagnosis not present

## 2021-04-03 DIAGNOSIS — Z7982 Long term (current) use of aspirin: Secondary | ICD-10-CM

## 2021-04-03 DIAGNOSIS — Z79890 Hormone replacement therapy (postmenopausal): Secondary | ICD-10-CM

## 2021-04-03 DIAGNOSIS — E119 Type 2 diabetes mellitus without complications: Secondary | ICD-10-CM

## 2021-04-03 DIAGNOSIS — I2510 Atherosclerotic heart disease of native coronary artery without angina pectoris: Secondary | ICD-10-CM

## 2021-04-03 DIAGNOSIS — Z952 Presence of prosthetic heart valve: Secondary | ICD-10-CM

## 2021-04-03 DIAGNOSIS — Z9102 Food additives allergy status: Secondary | ICD-10-CM

## 2021-04-03 DIAGNOSIS — Z800 Family history of malignant neoplasm of digestive organs: Secondary | ICD-10-CM | POA: Diagnosis not present

## 2021-04-03 DIAGNOSIS — Z09 Encounter for follow-up examination after completed treatment for conditions other than malignant neoplasm: Secondary | ICD-10-CM

## 2021-04-03 DIAGNOSIS — K219 Gastro-esophageal reflux disease without esophagitis: Secondary | ICD-10-CM | POA: Diagnosis present

## 2021-04-03 DIAGNOSIS — G4733 Obstructive sleep apnea (adult) (pediatric): Secondary | ICD-10-CM | POA: Diagnosis present

## 2021-04-03 DIAGNOSIS — Z79899 Other long term (current) drug therapy: Secondary | ICD-10-CM

## 2021-04-03 HISTORY — PX: TEE WITHOUT CARDIOVERSION: SHX5443

## 2021-04-03 HISTORY — PX: CORONARY ARTERY BYPASS GRAFT: SHX141

## 2021-04-03 HISTORY — PX: AORTIC VALVE REPLACEMENT: SHX41

## 2021-04-03 LAB — POCT I-STAT 7, (LYTES, BLD GAS, ICA,H+H)
Acid-base deficit: 2 mmol/L (ref 0.0–2.0)
Acid-base deficit: 2 mmol/L (ref 0.0–2.0)
Acid-base deficit: 1 mmol/L (ref 0.0–2.0)
Acid-base deficit: 1 mmol/L (ref 0.0–2.0)
Acid-base deficit: 1 mmol/L (ref 0.0–2.0)
Acid-base deficit: 4 mmol/L — ABNORMAL HIGH (ref 0.0–2.0)
Acid-base deficit: 3 mmol/L — ABNORMAL HIGH (ref 0.0–2.0)
Acid-base deficit: 3 mmol/L — ABNORMAL HIGH (ref 0.0–2.0)
Acid-base deficit: 3 mmol/L — ABNORMAL HIGH (ref 0.0–2.0)
Bicarbonate: 23.2 mmol/L (ref 20.0–28.0)
Bicarbonate: 23.6 mmol/L (ref 20.0–28.0)
Bicarbonate: 24.2 mmol/L (ref 20.0–28.0)
Bicarbonate: 24.6 mmol/L (ref 20.0–28.0)
Bicarbonate: 22 mmol/L (ref 20.0–28.0)
Bicarbonate: 25.1 mmol/L (ref 20.0–28.0)
Bicarbonate: 25 mmol/L (ref 20.0–28.0)
Bicarbonate: 23.5 mmol/L (ref 20.0–28.0)
Bicarbonate: 23.3 mmol/L (ref 20.0–28.0)
Calcium, Ion: 0.99 mmol/L — ABNORMAL LOW (ref 1.15–1.40)
Calcium, Ion: 1.07 mmol/L — ABNORMAL LOW (ref 1.15–1.40)
Calcium, Ion: 1.09 mmol/L — ABNORMAL LOW (ref 1.15–1.40)
Calcium, Ion: 1.12 mmol/L — ABNORMAL LOW (ref 1.15–1.40)
Calcium, Ion: 1.07 mmol/L — ABNORMAL LOW (ref 1.15–1.40)
Calcium, Ion: 1.1 mmol/L — ABNORMAL LOW (ref 1.15–1.40)
Calcium, Ion: 1.14 mmol/L — ABNORMAL LOW (ref 1.15–1.40)
Calcium, Ion: 1.08 mmol/L — ABNORMAL LOW (ref 1.15–1.40)
Calcium, Ion: 1.1 mmol/L — ABNORMAL LOW (ref 1.15–1.40)
HCT: 29 % — ABNORMAL LOW (ref 39.0–52.0)
HCT: 31 % — ABNORMAL LOW (ref 39.0–52.0)
HCT: 32 % — ABNORMAL LOW (ref 39.0–52.0)
HCT: 32 % — ABNORMAL LOW (ref 39.0–52.0)
HCT: 28 % — ABNORMAL LOW (ref 39.0–52.0)
HCT: 31 % — ABNORMAL LOW (ref 39.0–52.0)
HCT: 35 % — ABNORMAL LOW (ref 39.0–52.0)
HCT: 35 % — ABNORMAL LOW (ref 39.0–52.0)
HCT: 33 % — ABNORMAL LOW (ref 39.0–52.0)
Hemoglobin: 10.5 g/dL — ABNORMAL LOW (ref 13.0–17.0)
Hemoglobin: 9.9 g/dL — ABNORMAL LOW (ref 13.0–17.0)
Hemoglobin: 10.9 g/dL — ABNORMAL LOW (ref 13.0–17.0)
Hemoglobin: 10.9 g/dL — ABNORMAL LOW (ref 13.0–17.0)
Hemoglobin: 10.5 g/dL — ABNORMAL LOW (ref 13.0–17.0)
Hemoglobin: 9.5 g/dL — ABNORMAL LOW (ref 13.0–17.0)
Hemoglobin: 11.9 g/dL — ABNORMAL LOW (ref 13.0–17.0)
Hemoglobin: 11.9 g/dL — ABNORMAL LOW (ref 13.0–17.0)
Hemoglobin: 11.2 g/dL — ABNORMAL LOW (ref 13.0–17.0)
O2 Saturation: 100 %
O2 Saturation: 100 %
O2 Saturation: 100 %
O2 Saturation: 100 %
O2 Saturation: 100 %
O2 Saturation: 100 %
O2 Saturation: 98 %
O2 Saturation: 99 %
O2 Saturation: 99 %
Patient temperature: 36.6
Patient temperature: 36.5
Potassium: 4.8 mmol/L (ref 3.5–5.1)
Potassium: 5.2 mmol/L — ABNORMAL HIGH (ref 3.5–5.1)
Potassium: 5.1 mmol/L (ref 3.5–5.1)
Potassium: 5.6 mmol/L — ABNORMAL HIGH (ref 3.5–5.1)
Potassium: 5.2 mmol/L — ABNORMAL HIGH (ref 3.5–5.1)
Potassium: 6 mmol/L — ABNORMAL HIGH (ref 3.5–5.1)
Potassium: 4.6 mmol/L (ref 3.5–5.1)
Potassium: 4.6 mmol/L (ref 3.5–5.1)
Potassium: 4.5 mmol/L (ref 3.5–5.1)
Sodium: 136 mmol/L (ref 135–145)
Sodium: 138 mmol/L (ref 135–145)
Sodium: 137 mmol/L (ref 135–145)
Sodium: 137 mmol/L (ref 135–145)
Sodium: 136 mmol/L (ref 135–145)
Sodium: 137 mmol/L (ref 135–145)
Sodium: 139 mmol/L (ref 135–145)
Sodium: 139 mmol/L (ref 135–145)
Sodium: 139 mmol/L (ref 135–145)
TCO2: 24 mmol/L (ref 22–32)
TCO2: 25 mmol/L (ref 22–32)
TCO2: 25 mmol/L (ref 22–32)
TCO2: 26 mmol/L (ref 22–32)
TCO2: 23 mmol/L (ref 22–32)
TCO2: 26 mmol/L (ref 22–32)
TCO2: 27 mmol/L (ref 22–32)
TCO2: 25 mmol/L (ref 22–32)
TCO2: 25 mmol/L (ref 22–32)
pCO2 arterial: 42.8 mmHg (ref 32.0–48.0)
pCO2 arterial: 43.3 mmHg (ref 32.0–48.0)
pCO2 arterial: 40.6 mmHg (ref 32.0–48.0)
pCO2 arterial: 46.4 mmHg (ref 32.0–48.0)
pCO2 arterial: 43 mmHg (ref 32.0–48.0)
pCO2 arterial: 46.1 mmHg (ref 32.0–48.0)
pCO2 arterial: 54.9 mmHg — ABNORMAL HIGH (ref 32.0–48.0)
pCO2 arterial: 44.4 mmHg (ref 32.0–48.0)
pCO2 arterial: 43.8 mmHg (ref 32.0–48.0)
pH, Arterial: 7.342 — ABNORMAL LOW (ref 7.350–7.450)
pH, Arterial: 7.345 — ABNORMAL LOW (ref 7.350–7.450)
pH, Arterial: 7.333 — ABNORMAL LOW (ref 7.350–7.450)
pH, Arterial: 7.383 (ref 7.350–7.450)
pH, Arterial: 7.317 — ABNORMAL LOW (ref 7.350–7.450)
pH, Arterial: 7.344 — ABNORMAL LOW (ref 7.350–7.450)
pH, Arterial: 7.264 — ABNORMAL LOW (ref 7.350–7.450)
pH, Arterial: 7.33 — ABNORMAL LOW (ref 7.350–7.450)
pH, Arterial: 7.335 — ABNORMAL LOW (ref 7.350–7.450)
pO2, Arterial: 329 mmHg — ABNORMAL HIGH (ref 83.0–108.0)
pO2, Arterial: 371 mmHg — ABNORMAL HIGH (ref 83.0–108.0)
pO2, Arterial: 251 mmHg — ABNORMAL HIGH (ref 83.0–108.0)
pO2, Arterial: 292 mmHg — ABNORMAL HIGH (ref 83.0–108.0)
pO2, Arterial: 212 mmHg — ABNORMAL HIGH (ref 83.0–108.0)
pO2, Arterial: 435 mmHg — ABNORMAL HIGH (ref 83.0–108.0)
pO2, Arterial: 118 mmHg — ABNORMAL HIGH (ref 83.0–108.0)
pO2, Arterial: 137 mmHg — ABNORMAL HIGH (ref 83.0–108.0)
pO2, Arterial: 130 mmHg — ABNORMAL HIGH (ref 83.0–108.0)

## 2021-04-03 LAB — POCT I-STAT, CHEM 8
BUN: 22 mg/dL — ABNORMAL HIGH (ref 6–20)
BUN: 22 mg/dL — ABNORMAL HIGH (ref 6–20)
BUN: 20 mg/dL (ref 6–20)
BUN: 22 mg/dL — ABNORMAL HIGH (ref 6–20)
BUN: 23 mg/dL — ABNORMAL HIGH (ref 6–20)
Calcium, Ion: 1.24 mmol/L (ref 1.15–1.40)
Calcium, Ion: 1.23 mmol/L (ref 1.15–1.40)
Calcium, Ion: 1.07 mmol/L — ABNORMAL LOW (ref 1.15–1.40)
Calcium, Ion: 1.12 mmol/L — ABNORMAL LOW (ref 1.15–1.40)
Calcium, Ion: 1.04 mmol/L — ABNORMAL LOW (ref 1.15–1.40)
Chloride: 105 mmol/L (ref 98–111)
Chloride: 105 mmol/L (ref 98–111)
Chloride: 104 mmol/L (ref 98–111)
Chloride: 106 mmol/L (ref 98–111)
Chloride: 102 mmol/L (ref 98–111)
Creatinine, Ser: 0.8 mg/dL (ref 0.61–1.24)
Creatinine, Ser: 0.8 mg/dL (ref 0.61–1.24)
Creatinine, Ser: 0.8 mg/dL (ref 0.61–1.24)
Creatinine, Ser: 0.8 mg/dL (ref 0.61–1.24)
Creatinine, Ser: 0.8 mg/dL (ref 0.61–1.24)
Glucose, Bld: 195 mg/dL — ABNORMAL HIGH (ref 70–99)
Glucose, Bld: 161 mg/dL — ABNORMAL HIGH (ref 70–99)
Glucose, Bld: 114 mg/dL — ABNORMAL HIGH (ref 70–99)
Glucose, Bld: 147 mg/dL — ABNORMAL HIGH (ref 70–99)
Glucose, Bld: 201 mg/dL — ABNORMAL HIGH (ref 70–99)
HCT: 39 % (ref 39.0–52.0)
HCT: 39 % (ref 39.0–52.0)
HCT: 33 % — ABNORMAL LOW (ref 39.0–52.0)
HCT: 34 % — ABNORMAL LOW (ref 39.0–52.0)
HCT: 33 % — ABNORMAL LOW (ref 39.0–52.0)
Hemoglobin: 13.3 g/dL (ref 13.0–17.0)
Hemoglobin: 13.3 g/dL (ref 13.0–17.0)
Hemoglobin: 11.2 g/dL — ABNORMAL LOW (ref 13.0–17.0)
Hemoglobin: 11.6 g/dL — ABNORMAL LOW (ref 13.0–17.0)
Hemoglobin: 11.2 g/dL — ABNORMAL LOW (ref 13.0–17.0)
Potassium: 4.6 mmol/L (ref 3.5–5.1)
Potassium: 4.4 mmol/L (ref 3.5–5.1)
Potassium: 5.2 mmol/L — ABNORMAL HIGH (ref 3.5–5.1)
Potassium: 5.6 mmol/L — ABNORMAL HIGH (ref 3.5–5.1)
Potassium: 5.2 mmol/L — ABNORMAL HIGH (ref 3.5–5.1)
Sodium: 137 mmol/L (ref 135–145)
Sodium: 137 mmol/L (ref 135–145)
Sodium: 136 mmol/L (ref 135–145)
Sodium: 137 mmol/L (ref 135–145)
Sodium: 136 mmol/L (ref 135–145)
TCO2: 25 mmol/L (ref 22–32)
TCO2: 22 mmol/L (ref 22–32)
TCO2: 23 mmol/L (ref 22–32)
TCO2: 24 mmol/L (ref 22–32)
TCO2: 25 mmol/L (ref 22–32)

## 2021-04-03 LAB — BASIC METABOLIC PANEL
Anion gap: 7 (ref 5–15)
BUN: 18 mg/dL (ref 6–20)
CO2: 22 mmol/L (ref 22–32)
Calcium: 7.4 mg/dL — ABNORMAL LOW (ref 8.9–10.3)
Chloride: 107 mmol/L (ref 98–111)
Creatinine, Ser: 1.04 mg/dL (ref 0.61–1.24)
GFR, Estimated: >60 mL/min (ref >60)
Glucose, Bld: 127 mg/dL — ABNORMAL HIGH (ref 70–99)
Potassium: 4.5 mmol/L (ref 3.5–5.1)
Sodium: 136 mmol/L (ref 135–145)

## 2021-04-03 LAB — CBC
HCT: 35.8 % — ABNORMAL LOW (ref 39.0–52.0)
HCT: 34.2 % — ABNORMAL LOW (ref 39.0–52.0)
Hemoglobin: 12 g/dL — ABNORMAL LOW (ref 13.0–17.0)
Hemoglobin: 11.7 g/dL — ABNORMAL LOW (ref 13.0–17.0)
MCH: 29.9 pg (ref 26.0–34.0)
MCH: 30.3 pg (ref 26.0–34.0)
MCHC: 33.5 g/dL (ref 30.0–36.0)
MCHC: 34.2 g/dL (ref 30.0–36.0)
MCV: 89.1 fL (ref 80.0–100.0)
MCV: 88.6 fL (ref 80.0–100.0)
Platelets: 160 10*3/uL (ref 150–400)
Platelets: 176 10*3/uL (ref 150–400)
RBC: 4.02 MIL/uL — ABNORMAL LOW (ref 4.22–5.81)
RBC: 3.86 MIL/uL — ABNORMAL LOW (ref 4.22–5.81)
RDW: 12.6 % (ref 11.5–15.5)
RDW: 12.7 % (ref 11.5–15.5)
WBC: 17.8 10*3/uL — ABNORMAL HIGH (ref 4.0–10.5)
WBC: 15.9 10*3/uL — ABNORMAL HIGH (ref 4.0–10.5)
nRBC: 0 % (ref 0.0–0.2)
nRBC: 0 % (ref 0.0–0.2)

## 2021-04-03 LAB — BLOOD GAS, ARTERIAL
Acid-base deficit: 3.4 mmol/L — ABNORMAL HIGH (ref 0.0–2.0)
Bicarbonate: 21.2 mmol/L (ref 20.0–28.0)
Drawn by: 60286
FIO2: 21
O2 Saturation: 97.9 %
Patient temperature: 37
pCO2 arterial: 38.8 mmHg (ref 32.0–48.0)
pH, Arterial: 7.356 (ref 7.350–7.450)
pO2, Arterial: 101 mmHg (ref 83.0–108.0)

## 2021-04-03 LAB — HEMOGLOBIN AND HEMATOCRIT, BLOOD
HCT: 31.1 % — ABNORMAL LOW (ref 39.0–52.0)
Hemoglobin: 10.9 g/dL — ABNORMAL LOW (ref 13.0–17.0)

## 2021-04-03 LAB — POCT I-STAT EG7
Acid-base deficit: 4 mmol/L — ABNORMAL HIGH (ref 0.0–2.0)
Bicarbonate: 22.6 mmol/L (ref 20.0–28.0)
Calcium, Ion: 1.09 mmol/L — ABNORMAL LOW (ref 1.15–1.40)
HCT: 30 % — ABNORMAL LOW (ref 39.0–52.0)
Hemoglobin: 10.2 g/dL — ABNORMAL LOW (ref 13.0–17.0)
O2 Saturation: 70 %
Potassium: 4.1 mmol/L (ref 3.5–5.1)
Sodium: 138 mmol/L (ref 135–145)
TCO2: 24 mmol/L (ref 22–32)
pCO2, Ven: 47.7 mmHg (ref 44.0–60.0)
pH, Ven: 7.283 (ref 7.250–7.430)
pO2, Ven: 41 mmHg (ref 32.0–45.0)

## 2021-04-03 LAB — APTT: aPTT: 27 seconds (ref 24–36)

## 2021-04-03 LAB — PROTIME-INR
INR: 1.2 (ref 0.8–1.2)
Prothrombin Time: 15.6 seconds — ABNORMAL HIGH (ref 11.4–15.2)

## 2021-04-03 LAB — GLUCOSE, CAPILLARY
Glucose-Capillary: 192 mg/dL — ABNORMAL HIGH (ref 70–99)
Glucose-Capillary: 97 mg/dL (ref 70–99)
Glucose-Capillary: 120 mg/dL — ABNORMAL HIGH (ref 70–99)
Glucose-Capillary: 98 mg/dL (ref 70–99)
Glucose-Capillary: 144 mg/dL — ABNORMAL HIGH (ref 70–99)
Glucose-Capillary: 129 mg/dL — ABNORMAL HIGH (ref 70–99)
Glucose-Capillary: 120 mg/dL — ABNORMAL HIGH (ref 70–99)
Glucose-Capillary: 145 mg/dL — ABNORMAL HIGH (ref 70–99)
Glucose-Capillary: 126 mg/dL — ABNORMAL HIGH (ref 70–99)
Glucose-Capillary: 112 mg/dL — ABNORMAL HIGH (ref 70–99)

## 2021-04-03 LAB — ABO/RH: ABO/RH(D): AB POS

## 2021-04-03 LAB — PLATELET COUNT: Platelets: 156 10*3/uL (ref 150–400)

## 2021-04-03 SURGERY — REPLACEMENT, AORTIC VALVE, OPEN
Anesthesia: General | Site: Chest

## 2021-04-03 MED ORDER — PHENYLEPHRINE HCL-NACL 20-0.9 MG/250ML-% IV SOLN: 0.0000 ug/min | INTRAVENOUS | Status: DC

## 2021-04-03 MED ORDER — METOPROLOL TARTRATE 12.5 MG HALF TABLET
12.5000 mg | ORAL_TABLET | Freq: Once | ORAL | Status: AC
  Administered 2021-04-03: 12.5 mg via ORAL
  Filled 2021-04-03: qty 1

## 2021-04-03 MED ORDER — ALBUMIN HUMAN 5 % IV SOLN: INTRAVENOUS | Status: DC | PRN

## 2021-04-03 MED ORDER — LEVOTHYROXINE SODIUM 75 MCG PO TABS
150.0000 ug | ORAL_TABLET | Freq: Every day | ORAL | Status: DC
  Administered 2021-04-04 – 2021-04-07 (×4): 150 ug via ORAL
  Filled 2021-04-03 (×2): qty 1
  Filled 2021-04-03 (×2): qty 2

## 2021-04-03 MED ORDER — SERTRALINE HCL 50 MG PO TABS
50.0000 mg | ORAL_TABLET | Freq: Every day | ORAL | Status: DC
  Administered 2021-04-04 – 2021-04-07 (×4): 50 mg via ORAL
  Filled 2021-04-03 (×4): qty 1

## 2021-04-03 MED ORDER — MIDAZOLAM HCL 2 MG/2ML IJ SOLN: 2.0000 mg | INTRAMUSCULAR | Status: DC | PRN

## 2021-04-03 MED ORDER — SODIUM CHLORIDE 0.45 % IV SOLN: INTRAVENOUS | Status: DC | PRN

## 2021-04-03 MED ORDER — ASPIRIN 81 MG PO CHEW: 324.0000 mg | CHEWABLE_TABLET | Freq: Every day | ORAL | Status: DC

## 2021-04-03 MED ORDER — LACTATED RINGERS IV SOLN
INTRAVENOUS | Status: DC | PRN
Start: 2021-04-03 — End: 2021-04-03

## 2021-04-03 MED ORDER — FAMOTIDINE IN NACL 20-0.9 MG/50ML-% IV SOLN
20.0000 mg | Freq: Two times a day (BID) | INTRAVENOUS | Status: AC
  Administered 2021-04-03 (×2): 20 mg via INTRAVENOUS
  Filled 2021-04-03 (×2): qty 50

## 2021-04-03 MED ORDER — MORPHINE SULFATE (PF) 2 MG/ML IV SOLN
1.0000 mg | INTRAVENOUS | Status: DC | PRN
  Administered 2021-04-03 – 2021-04-04 (×7): 2 mg via INTRAVENOUS
  Filled 2021-04-03 (×7): qty 1

## 2021-04-03 MED ORDER — NITROGLYCERIN IN D5W 200-5 MCG/ML-% IV SOLN: 0.0000 ug/min | INTRAVENOUS | Status: DC

## 2021-04-03 MED ORDER — MIDAZOLAM HCL (PF) 10 MG/2ML IJ SOLN
INTRAMUSCULAR | Status: AC
  Filled 2021-04-03: qty 2

## 2021-04-03 MED ORDER — CHLORHEXIDINE GLUCONATE 0.12 % MT SOLN
15.0000 mL | OROMUCOSAL | Status: AC
  Administered 2021-04-03: 15 mL via OROMUCOSAL
  Filled 2021-04-03: qty 15

## 2021-04-03 MED ORDER — SODIUM CHLORIDE 0.9% FLUSH: 3.0000 mL | INTRAVENOUS | Status: DC | PRN

## 2021-04-03 MED ORDER — LACTATED RINGERS IV SOLN: INTRAVENOUS | Status: DC

## 2021-04-03 MED ORDER — ACETAMINOPHEN 160 MG/5ML PO SOLN: 650.0000 mg | Freq: Once | ORAL | Status: AC

## 2021-04-03 MED ORDER — VANCOMYCIN HCL IN DEXTROSE 1-5 GM/200ML-% IV SOLN
1000.0000 mg | Freq: Once | INTRAVENOUS | Status: AC
  Administered 2021-04-03: 1000 mg via INTRAVENOUS
  Filled 2021-04-03: qty 200

## 2021-04-03 MED ORDER — METOPROLOL TARTRATE 5 MG/5ML IV SOLN: 2.5000 mg | INTRAVENOUS | Status: DC | PRN

## 2021-04-03 MED ORDER — POTASSIUM CHLORIDE 10 MEQ/50ML IV SOLN: 10.0000 meq | INTRAVENOUS | Status: AC

## 2021-04-03 MED ORDER — DOCUSATE SODIUM 100 MG PO CAPS
200.0000 mg | ORAL_CAPSULE | Freq: Every day | ORAL | Status: DC
  Administered 2021-04-04 – 2021-04-05 (×2): 200 mg via ORAL
  Filled 2021-04-03 (×2): qty 2

## 2021-04-03 MED ORDER — METOPROLOL TARTRATE 12.5 MG HALF TABLET
12.5000 mg | ORAL_TABLET | Freq: Two times a day (BID) | ORAL | Status: DC
  Administered 2021-04-04 – 2021-04-05 (×3): 12.5 mg via ORAL
  Filled 2021-04-03 (×3): qty 1

## 2021-04-03 MED ORDER — SODIUM BICARBONATE 8.4 % IV SOLN
50.0000 meq | Freq: Once | INTRAVENOUS | Status: AC
  Administered 2021-04-03: 50 meq via INTRAVENOUS

## 2021-04-03 MED ORDER — CHLORHEXIDINE GLUCONATE 0.12 % MT SOLN
15.0000 mL | Freq: Once | OROMUCOSAL | Status: AC
  Administered 2021-04-03: 15 mL via OROMUCOSAL

## 2021-04-03 MED ORDER — PANTOPRAZOLE SODIUM 40 MG PO TBEC
40.0000 mg | DELAYED_RELEASE_TABLET | Freq: Every day | ORAL | Status: DC
  Administered 2021-04-05: 40 mg via ORAL
  Filled 2021-04-03: qty 1

## 2021-04-03 MED ORDER — ASPIRIN EC 325 MG PO TBEC
325.0000 mg | DELAYED_RELEASE_TABLET | Freq: Every day | ORAL | Status: DC
  Administered 2021-04-04 – 2021-04-05 (×2): 325 mg via ORAL
  Filled 2021-04-03 (×2): qty 1

## 2021-04-03 MED ORDER — ONDANSETRON HCL 4 MG/2ML IJ SOLN
4.0000 mg | Freq: Four times a day (QID) | INTRAMUSCULAR | Status: DC | PRN
  Administered 2021-04-04 – 2021-04-05 (×2): 4 mg via INTRAVENOUS
  Filled 2021-04-03 (×2): qty 2

## 2021-04-03 MED ORDER — HEMOSTATIC AGENTS (NO CHARGE) OPTIME
TOPICAL | Status: DC | PRN
  Administered 2021-04-03 (×2): 1 via TOPICAL

## 2021-04-03 MED ORDER — ACETAMINOPHEN 650 MG RE SUPP
650.0000 mg | Freq: Once | RECTAL | Status: AC
  Administered 2021-04-03: 650 mg via RECTAL

## 2021-04-03 MED ORDER — PROPOFOL 10 MG/ML IV BOLUS
INTRAVENOUS | Status: AC
  Filled 2021-04-03: qty 20

## 2021-04-03 MED ORDER — ROCURONIUM BROMIDE 10 MG/ML (PF) SYRINGE
PREFILLED_SYRINGE | INTRAVENOUS | Status: DC | PRN
  Administered 2021-04-03: 40 mg via INTRAVENOUS
  Administered 2021-04-03: 50 mg via INTRAVENOUS
  Administered 2021-04-03: 100 mg via INTRAVENOUS
  Administered 2021-04-03 (×2): 50 mg via INTRAVENOUS

## 2021-04-03 MED ORDER — CEFAZOLIN SODIUM-DEXTROSE 2-4 GM/100ML-% IV SOLN
2.0000 g | Freq: Three times a day (TID) | INTRAVENOUS | Status: AC
  Administered 2021-04-03 – 2021-04-05 (×6): 2 g via INTRAVENOUS
  Filled 2021-04-03 (×6): qty 100

## 2021-04-03 MED ORDER — OXYCODONE HCL 5 MG PO TABS
5.0000 mg | ORAL_TABLET | ORAL | Status: DC | PRN
  Administered 2021-04-03 – 2021-04-04 (×3): 10 mg via ORAL
  Filled 2021-04-03 (×3): qty 2

## 2021-04-03 MED ORDER — FENTANYL CITRATE (PF) 250 MCG/5ML IJ SOLN
INTRAMUSCULAR | Status: DC | PRN
  Administered 2021-04-03: 100 ug via INTRAVENOUS
  Administered 2021-04-03 (×2): 50 ug via INTRAVENOUS
  Administered 2021-04-03: 100 ug via INTRAVENOUS
  Administered 2021-04-03: 200 ug via INTRAVENOUS
  Administered 2021-04-03: 50 ug via INTRAVENOUS
  Administered 2021-04-03: 150 ug via INTRAVENOUS
  Administered 2021-04-03 (×2): 100 ug via INTRAVENOUS
  Administered 2021-04-03: 50 ug via INTRAVENOUS

## 2021-04-03 MED ORDER — MAGNESIUM SULFATE 4 GM/100ML IV SOLN
4.0000 g | Freq: Once | INTRAVENOUS | Status: AC
  Administered 2021-04-03: 4 g via INTRAVENOUS
  Filled 2021-04-03: qty 100

## 2021-04-03 MED ORDER — BISACODYL 5 MG PO TBEC
10.0000 mg | DELAYED_RELEASE_TABLET | Freq: Every day | ORAL | Status: DC
  Administered 2021-04-04 – 2021-04-05 (×2): 10 mg via ORAL
  Filled 2021-04-03 (×2): qty 2

## 2021-04-03 MED ORDER — THROMBIN 20000 UNITS EX SOLR
CUTANEOUS | Status: DC | PRN
  Administered 2021-04-03 (×3): 4 mL via TOPICAL

## 2021-04-03 MED ORDER — THROMBIN (RECOMBINANT) 20000 UNITS EX SOLR
CUTANEOUS | Status: AC
  Filled 2021-04-03: qty 20000

## 2021-04-03 MED ORDER — 0.9 % SODIUM CHLORIDE (POUR BTL) OPTIME
TOPICAL | Status: DC | PRN
  Administered 2021-04-03: 5000 mL

## 2021-04-03 MED ORDER — DEXMEDETOMIDINE HCL IN NACL 400 MCG/100ML IV SOLN
0.0000 ug/kg/h | INTRAVENOUS | Status: DC
  Administered 2021-04-03: 0.2 ug/kg/h via INTRAVENOUS
  Filled 2021-04-03: qty 100

## 2021-04-03 MED ORDER — TRAMADOL HCL 50 MG PO TABS
50.0000 mg | ORAL_TABLET | ORAL | Status: DC | PRN
  Administered 2021-04-03 – 2021-04-05 (×3): 100 mg via ORAL
  Filled 2021-04-03 (×3): qty 2

## 2021-04-03 MED ORDER — CHLORHEXIDINE GLUCONATE 4 % EX LIQD: 30.0000 mL | CUTANEOUS | Status: DC

## 2021-04-03 MED ORDER — PROTAMINE SULFATE 10 MG/ML IV SOLN
INTRAVENOUS | Status: AC
  Filled 2021-04-03: qty 5

## 2021-04-03 MED ORDER — FENTANYL CITRATE (PF) 250 MCG/5ML IJ SOLN
INTRAMUSCULAR | Status: AC
  Filled 2021-04-03: qty 25

## 2021-04-03 MED ORDER — PLASMA-LYTE A IV SOLN
INTRAVENOUS | Status: DC | PRN
  Administered 2021-04-03: 500 mL via INTRAVASCULAR

## 2021-04-03 MED ORDER — PHENYLEPHRINE 40 MCG/ML (10ML) SYRINGE FOR IV PUSH (FOR BLOOD PRESSURE SUPPORT)
PREFILLED_SYRINGE | INTRAVENOUS | Status: AC
  Filled 2021-04-03: qty 10

## 2021-04-03 MED ORDER — METOPROLOL TARTRATE 25 MG/10 ML ORAL SUSPENSION: 12.5000 mg | Freq: Two times a day (BID) | ORAL | Status: DC

## 2021-04-03 MED ORDER — ACETAMINOPHEN 160 MG/5ML PO SOLN: 1000.0000 mg | Freq: Four times a day (QID) | ORAL | Status: DC

## 2021-04-03 MED ORDER — PROTAMINE SULFATE 10 MG/ML IV SOLN
INTRAVENOUS | Status: AC
  Filled 2021-04-03: qty 25

## 2021-04-03 MED ORDER — PROTAMINE SULFATE 10 MG/ML IV SOLN
INTRAVENOUS | Status: DC | PRN
  Administered 2021-04-03: 280 mg via INTRAVENOUS

## 2021-04-03 MED ORDER — SODIUM CHLORIDE 0.9 % IV SOLN
250.0000 mL | INTRAVENOUS | Status: DC
  Administered 2021-04-03: 250 mL via INTRAVENOUS

## 2021-04-03 MED ORDER — SODIUM CHLORIDE 0.9% FLUSH
3.0000 mL | Freq: Two times a day (BID) | INTRAVENOUS | Status: DC
  Administered 2021-04-04 – 2021-04-05 (×3): 3 mL via INTRAVENOUS

## 2021-04-03 MED ORDER — INSULIN REGULAR(HUMAN) IN NACL 100-0.9 UT/100ML-% IV SOLN: INTRAVENOUS | Status: DC

## 2021-04-03 MED ORDER — BISACODYL 10 MG RE SUPP: 10.0000 mg | Freq: Every day | RECTAL | Status: DC

## 2021-04-03 MED ORDER — ~~LOC~~ CARDIAC SURGERY, PATIENT & FAMILY EDUCATION
Freq: Once | Status: DC
  Filled 2021-04-03: qty 1

## 2021-04-03 MED ORDER — ROCURONIUM BROMIDE 10 MG/ML (PF) SYRINGE
PREFILLED_SYRINGE | INTRAVENOUS | Status: AC
  Filled 2021-04-03: qty 20

## 2021-04-03 MED ORDER — MYCOPHENOLATE MOFETIL 250 MG PO CAPS
250.0000 mg | ORAL_CAPSULE | Freq: Two times a day (BID) | ORAL | Status: DC
  Administered 2021-04-03 – 2021-04-07 (×8): 250 mg via ORAL
  Filled 2021-04-03 (×11): qty 1

## 2021-04-03 MED ORDER — DEXTROSE 50 % IV SOLN: 0.0000 mL | INTRAVENOUS | Status: DC | PRN

## 2021-04-03 MED ORDER — LACTATED RINGERS IV SOLN: 500.0000 mL | Freq: Once | INTRAVENOUS | Status: DC | PRN

## 2021-04-03 MED ORDER — MIDAZOLAM HCL 2 MG/2ML IJ SOLN
INTRAMUSCULAR | Status: DC | PRN
Start: 2021-04-03 — End: 2021-04-03
  Administered 2021-04-03 (×2): 1 mg via INTRAVENOUS
  Administered 2021-04-03: 2 mg via INTRAVENOUS
  Administered 2021-04-03 (×2): 1 mg via INTRAVENOUS
  Administered 2021-04-03 (×2): 2 mg via INTRAVENOUS

## 2021-04-03 MED ORDER — SODIUM CHLORIDE 0.9 % IV SOLN: INTRAVENOUS | Status: DC

## 2021-04-03 MED ORDER — CHLORHEXIDINE GLUCONATE 0.12% ORAL RINSE (MEDLINE KIT)
15.0000 mL | Freq: Two times a day (BID) | OROMUCOSAL | Status: DC
  Administered 2021-04-03 – 2021-04-04 (×2): 15 mL via OROMUCOSAL

## 2021-04-03 MED ORDER — SODIUM CHLORIDE (PF) 0.9 % IJ SOLN
INTRAMUSCULAR | Status: AC
  Filled 2021-04-03: qty 20

## 2021-04-03 MED ORDER — PHENYLEPHRINE HCL-NACL 20-0.9 MG/250ML-% IV SOLN
INTRAVENOUS | Status: DC | PRN
  Administered 2021-04-03: 20 ug/min via INTRAVENOUS

## 2021-04-03 MED ORDER — ORAL CARE MOUTH RINSE
15.0000 mL | OROMUCOSAL | Status: DC
  Administered 2021-04-03 – 2021-04-04 (×11): 15 mL via OROMUCOSAL

## 2021-04-03 MED ORDER — LACTATED RINGERS IV SOLN: INTRAVENOUS | Status: DC | PRN

## 2021-04-03 MED ORDER — PROPOFOL 10 MG/ML IV BOLUS
INTRAVENOUS | Status: DC | PRN
Start: 2021-04-03 — End: 2021-04-03
  Administered 2021-04-03: 70 mg via INTRAVENOUS

## 2021-04-03 MED ORDER — HEPARIN SODIUM (PORCINE) 1000 UNIT/ML IJ SOLN
INTRAMUSCULAR | Status: AC
  Filled 2021-04-03: qty 1

## 2021-04-03 MED ORDER — ROCURONIUM BROMIDE 10 MG/ML (PF) SYRINGE
PREFILLED_SYRINGE | INTRAVENOUS | Status: AC
  Filled 2021-04-03: qty 10

## 2021-04-03 MED ORDER — CHLORHEXIDINE GLUCONATE CLOTH 2 % EX PADS
6.0000 | MEDICATED_PAD | Freq: Every day | CUTANEOUS | Status: DC
  Administered 2021-04-04 – 2021-04-05 (×2): 6 via TOPICAL

## 2021-04-03 MED ORDER — PHENYLEPHRINE 40 MCG/ML (10ML) SYRINGE FOR IV PUSH (FOR BLOOD PRESSURE SUPPORT)
PREFILLED_SYRINGE | INTRAVENOUS | Status: DC | PRN
  Administered 2021-04-03 (×4): 40 ug via INTRAVENOUS
  Administered 2021-04-03: 80 ug via INTRAVENOUS

## 2021-04-03 MED ORDER — HEPARIN SODIUM (PORCINE) 1000 UNIT/ML IJ SOLN
INTRAMUSCULAR | Status: DC | PRN
  Administered 2021-04-03: 28000 [IU] via INTRAVENOUS

## 2021-04-03 MED ORDER — ACETAMINOPHEN 500 MG PO TABS
1000.0000 mg | ORAL_TABLET | Freq: Four times a day (QID) | ORAL | Status: DC
  Administered 2021-04-04 (×4): 1000 mg via ORAL
  Filled 2021-04-03 (×4): qty 2

## 2021-04-03 MED ORDER — ALBUMIN HUMAN 5 % IV SOLN
250.0000 mL | INTRAVENOUS | Status: DC | PRN
  Administered 2021-04-03 (×3): 12.5 g via INTRAVENOUS
  Filled 2021-04-03: qty 250

## 2021-04-03 MED ORDER — TACROLIMUS 1 MG PO CAPS
2.0000 mg | ORAL_CAPSULE | Freq: Two times a day (BID) | ORAL | Status: DC
  Administered 2021-04-04 – 2021-04-07 (×6): 2 mg via ORAL
  Filled 2021-04-03 (×6): qty 2

## 2021-04-03 SURGICAL SUPPLY — 118 items
ADAPTER CARDIO PERF ANTE/RETRO (ADAPTER) ×3 IMPLANT
BAG DECANTER FOR FLEXI CONT (MISCELLANEOUS) ×3 IMPLANT
BLADE CLIPPER SURG (BLADE) ×3 IMPLANT
BLADE STERNUM SYSTEM 6 (BLADE) ×3 IMPLANT
BLADE SURG 15 STRL LF DISP TIS (BLADE) ×2 IMPLANT
BLADE SURG 15 STRL SS (BLADE) ×3
BNDG ELASTIC 4X5.8 VLCR STR LF (GAUZE/BANDAGES/DRESSINGS) ×2 IMPLANT
BNDG ELASTIC 6X5.8 VLCR STR LF (GAUZE/BANDAGES/DRESSINGS) ×2 IMPLANT
BNDG GAUZE ELAST 4 BULKY (GAUZE/BANDAGES/DRESSINGS) ×2 IMPLANT
CANISTER SUCT 3000ML PPV (MISCELLANEOUS) ×3 IMPLANT
CANNULA GUNDRY RCSP 15FR (MISCELLANEOUS) ×3 IMPLANT
CATH HEART VENT LEFT (CATHETERS) ×2 IMPLANT
CATH ROBINSON RED A/P 18FR (CATHETERS) ×9 IMPLANT
CATH THORACIC 28FR (CATHETERS) ×3 IMPLANT
CATH THORACIC 36FR (CATHETERS) ×3 IMPLANT
CATH THORACIC 36FR RT ANG (CATHETERS) ×3 IMPLANT
CLIP VESOCCLUDE MED 24/CT (CLIP) IMPLANT
CLIP VESOCCLUDE SM WIDE 24/CT (CLIP) IMPLANT
CNTNR URN SCR LID CUP LEK RST (MISCELLANEOUS) ×2 IMPLANT
CONT SPEC 4OZ STRL OR WHT (MISCELLANEOUS) ×3
CONTAINER PROTECT SURGISLUSH (MISCELLANEOUS) ×6 IMPLANT
COVER SURGICAL LIGHT HANDLE (MISCELLANEOUS) ×3 IMPLANT
DEVICE SUT CK QUICK LOAD MINI (Prosthesis & Implant Heart) ×3 IMPLANT
DRAPE CARDIOVASCULAR INCISE (DRAPES) ×3
DRAPE SRG 135X102X78XABS (DRAPES) ×2 IMPLANT
DRAPE WARM FLUID 44X44 (DRAPES) ×3 IMPLANT
DRSG COVADERM 4X14 (GAUZE/BANDAGES/DRESSINGS) ×3 IMPLANT
ELECT BLADE 4.0 EZ CLEAN MEGAD (MISCELLANEOUS) ×3
ELECT CAUTERY BLADE 6.4 (BLADE) ×3 IMPLANT
ELECT REM PT RETURN 9FT ADLT (ELECTROSURGICAL) ×6
ELECTRODE BLDE 4.0 EZ CLN MEGD (MISCELLANEOUS) IMPLANT
ELECTRODE REM PT RTRN 9FT ADLT (ELECTROSURGICAL) ×4 IMPLANT
FELT TEFLON 1X6 (MISCELLANEOUS) ×6 IMPLANT
GAUZE 4X4 16PLY ~~LOC~~+RFID DBL (SPONGE) ×3 IMPLANT
GAUZE SPONGE 4X4 12PLY STRL (GAUZE/BANDAGES/DRESSINGS) ×5 IMPLANT
GAUZE SPONGE 4X4 12PLY STRL LF (GAUZE/BANDAGES/DRESSINGS) ×1 IMPLANT
GLOVE SURG ENC MOIS LTX SZ6 (GLOVE) IMPLANT
GLOVE SURG ENC MOIS LTX SZ6.5 (GLOVE) IMPLANT
GLOVE SURG ENC MOIS LTX SZ7 (GLOVE) IMPLANT
GLOVE SURG ENC MOIS LTX SZ7.5 (GLOVE) IMPLANT
GLOVE SURG MICRO LTX SZ6.5 (GLOVE) ×1 IMPLANT
GLOVE SURG MICRO LTX SZ7 (GLOVE) ×7 IMPLANT
GLOVE SURG ORTHO LTX SZ7.5 (GLOVE) IMPLANT
GLOVE SURG UNDER POLY LF SZ6 (GLOVE) IMPLANT
GLOVE SURG UNDER POLY LF SZ6.5 (GLOVE) IMPLANT
GLOVE SURG UNDER POLY LF SZ7 (GLOVE) IMPLANT
GOWN STRL REUS W/ TWL LRG LVL3 (GOWN DISPOSABLE) ×8 IMPLANT
GOWN STRL REUS W/ TWL XL LVL3 (GOWN DISPOSABLE) ×2 IMPLANT
GOWN STRL REUS W/TWL LRG LVL3 (GOWN DISPOSABLE) ×12
GOWN STRL REUS W/TWL XL LVL3 (GOWN DISPOSABLE) ×6
HEMOSTAT POWDER SURGIFOAM 1G (HEMOSTASIS) ×9 IMPLANT
HEMOSTAT SURGICEL 2X14 (HEMOSTASIS) ×4 IMPLANT
INSERT FOGARTY 61MM (MISCELLANEOUS) IMPLANT
INSERT FOGARTY XLG (MISCELLANEOUS) ×1 IMPLANT
IV ADAPTER SYR DOUBLE MALE LL (MISCELLANEOUS) ×1 IMPLANT
KIT BASIN OR (CUSTOM PROCEDURE TRAY) ×3 IMPLANT
KIT CATH CPB BARTLE (MISCELLANEOUS) ×3 IMPLANT
KIT DEVICE SUT COR-KNOT MIS 5 (INSTRUMENTS) ×1 IMPLANT
KIT SUCTION CATH 14FR (SUCTIONS) ×3 IMPLANT
KIT TURNOVER KIT B (KITS) ×3 IMPLANT
KIT VASOVIEW HEMOPRO 2 VH 4000 (KITS) ×2 IMPLANT
NS IRRIG 1000ML POUR BTL (IV SOLUTION) ×17 IMPLANT
PACK E OPEN HEART (SUTURE) ×3 IMPLANT
PACK OPEN HEART (CUSTOM PROCEDURE TRAY) ×3 IMPLANT
PAD ARMBOARD 7.5X6 YLW CONV (MISCELLANEOUS) ×6 IMPLANT
PAD ELECT DEFIB RADIOL ZOLL (MISCELLANEOUS) ×3 IMPLANT
PENCIL BUTTON HOLSTER BLD 10FT (ELECTRODE) ×3 IMPLANT
POSITIONER HEAD DONUT 9IN (MISCELLANEOUS) ×3 IMPLANT
PUNCH AORTIC ROTATE 4.0MM (MISCELLANEOUS) IMPLANT
PUNCH AORTIC ROTATE 4.5MM 8IN (MISCELLANEOUS) ×2 IMPLANT
PUNCH AORTIC ROTATE 5MM 8IN (MISCELLANEOUS) IMPLANT
SET MPS 3-ND DEL (MISCELLANEOUS) ×1 IMPLANT
SPONGE INTESTINAL PEANUT (DISPOSABLE) IMPLANT
SPONGE T-LAP 18X18 ~~LOC~~+RFID (SPONGE) ×12 IMPLANT
SPONGE T-LAP 4X18 ~~LOC~~+RFID (SPONGE) ×6 IMPLANT
SUPPORT HEART JANKE-BARRON (MISCELLANEOUS) ×3 IMPLANT
SUT BONE WAX W31G (SUTURE) ×3 IMPLANT
SUT EB EXC GRN/WHT 2-0 V-5 (SUTURE) ×6 IMPLANT
SUT ETHIBON EXCEL 2-0 V-5 (SUTURE) ×2 IMPLANT
SUT ETHIBOND V-5 VALVE (SUTURE) ×2 IMPLANT
SUT MNCRL AB 4-0 PS2 18 (SUTURE) IMPLANT
SUT PROLENE 3 0 SH DA (SUTURE) IMPLANT
SUT PROLENE 3 0 SH1 36 (SUTURE) ×3 IMPLANT
SUT PROLENE 4 0 RB 1 (SUTURE) ×12
SUT PROLENE 4 0 SH DA (SUTURE) IMPLANT
SUT PROLENE 4-0 RB1 .5 CRCL 36 (SUTURE) ×6 IMPLANT
SUT PROLENE 5 0 C 1 36 (SUTURE) IMPLANT
SUT PROLENE 6 0 C 1 30 (SUTURE) IMPLANT
SUT PROLENE 7 0 BV 1 (SUTURE) IMPLANT
SUT PROLENE 7 0 BV1 MDA (SUTURE) ×3 IMPLANT
SUT PROLENE 8 0 BV175 6 (SUTURE) IMPLANT
SUT SILK  1 MH (SUTURE)
SUT SILK 1 MH (SUTURE) IMPLANT
SUT SILK 2 0 SH (SUTURE) IMPLANT
SUT STEEL 6MS V (SUTURE) IMPLANT
SUT STEEL STERNAL CCS#1 18IN (SUTURE) ×1 IMPLANT
SUT STEEL SZ 6 DBL 3X14 BALL (SUTURE) ×3 IMPLANT
SUT TEM PAC WIRE 2 0 SH (SUTURE) ×4 IMPLANT
SUT VIC AB 1 CTX 36 (SUTURE) ×6
SUT VIC AB 1 CTX36XBRD ANBCTR (SUTURE) ×4 IMPLANT
SUT VIC AB 2-0 CT1 27 (SUTURE)
SUT VIC AB 2-0 CT1 TAPERPNT 27 (SUTURE) IMPLANT
SUT VIC AB 2-0 CTX 27 (SUTURE) IMPLANT
SUT VIC AB 3-0 SH 27 (SUTURE)
SUT VIC AB 3-0 SH 27X BRD (SUTURE) IMPLANT
SUT VIC AB 3-0 X1 27 (SUTURE) IMPLANT
SUT VICRYL 4-0 PS2 18IN ABS (SUTURE) IMPLANT
SYSTEM SAHARA CHEST DRAIN ATS (WOUND CARE) ×3 IMPLANT
TOWEL GREEN STERILE (TOWEL DISPOSABLE) ×3 IMPLANT
TOWEL GREEN STERILE FF (TOWEL DISPOSABLE) ×3 IMPLANT
TRAY FOLEY SLVR 16FR TEMP STAT (SET/KITS/TRAYS/PACK) ×3 IMPLANT
TUBE SUCT INTRACARD DLP 20F (MISCELLANEOUS) ×1 IMPLANT
TUBING LAP HI FLOW INSUFFLATIO (TUBING) ×2 IMPLANT
UNDERPAD 30X36 HEAVY ABSORB (UNDERPADS AND DIAPERS) ×3 IMPLANT
VALVE AORTIC SZ27 INSP/RESIL (Valve) ×1 IMPLANT
VENT LEFT HEART 12002 (CATHETERS) ×3
WATER STERILE IRR 1000ML POUR (IV SOLUTION) ×6 IMPLANT
YANKAUER SUCT BULB TIP NO VENT (SUCTIONS) ×2 IMPLANT

## 2021-04-03 NOTE — Op Note (Signed)
CARDIOVASCULAR SURGERY OPERATIVE NOTE  04/03/2021  Surgeon:  Gaye Pollack, MD  First Assistant: Jadene Pierini, PA-C:   An experienced assistant was required given the complexity of this surgery and the standard of surgical care. The assistant was needed for exposure, dissection, suctioning, retraction of delicate tissues and sutures, instrument exchange and for overall help during this procedure.   Preoperative Diagnosis:  Severe aortic stenosis and single-vessel coronary artery disease   Postoperative Diagnosis:  Same   Procedure:  Median Sternotomy Extracorporeal circulation 3.   Coronary artery bypass grafting x 1  Left internal mammary artery graft to the LAD 4.   Aortic valve replacement using a 27 mm Edwards INSPIRIS RESILIA pericardial valve.  Anesthesia:  General Endotracheal   Clinical History/Surgical Indication:  This 61 year old gentleman has a Scientist, research (physical sciences) type I bicuspid aortic valve with severe calcification and thickening and stage D, severe, symptomatic aortic stenosis with New York Heart Association class ll symptoms of exertional fatigue and shortness of breath consistent with chronic diastolic congestive heart failure.  His cardiac catheterization shows an 80% proximal to mid LAD stenosis.  His gated cardiac CTA showed an annular area of 895 mm which is too large for TAVR.  Given his young age and the fact that his aortic annular area is too large for TAVR I think that open surgical aortic valve replacement and bypass of the LAD would be the best treatment for him.  He will need to complete his 34-month course of Eliquis for bilateral pulmonary emboli.  Then his Eliquis can be stopped at least 5 days prior to surgery.  I reviewed the cardiac catheterization and echo images with him and answered all of his questions. I discussed the operative procedure with the patient and family  including alternatives, benefits and risks; including but not limited to bleeding, blood transfusion, infection, stroke, myocardial infarction, graft failure, heart block requiring a permanent pacemaker, organ dysfunction, and death.  Danelle Berry understands and agrees to proceed.       Preparation:  The patient was seen in the preoperative holding area and the correct patient, correct operation were confirmed with the patient after reviewing the medical record and catheterization. The consent was signed by me. Preoperative antibiotics were given. A pulmonary arterial line and radial arterial line were placed by the anesthesia team. The patient was taken back to the operating room and positioned supine on the operating room table. After being placed under general endotracheal anesthesia by the anesthesia team a foley catheter was placed. The neck, chest, abdomen, and both legs were prepped with betadine soap and solution and draped in the usual sterile manner. A surgical time-out was taken and the correct patient and operative procedure were confirmed with the nursing and anesthesia staff.   Cardiopulmonary Bypass:  A median sternotomy was performed. The pericardium was opened in the midline. The pericardial cavity was obliterated with adhesions probably related to blood in the pericardial from his prior liver transplant. When the midline fascia over the xyphoid process was opened the liver was right there and firmly adherent. This limited opening of the sternal retractor due to concerns about splitting the liver tissue and should be considered if any redo sternotomy is performed in the future. Right ventricular function appeared normal. The ascending aorta was of normal size and had no palpable plaque. There were no contraindications to aortic cannulation or cross-clamping. The patient was fully systemically heparinized and the ACT was maintained > 400 sec. The proximal aortic arch was  cannulated with  a 24 F aortic cannula for arterial inflow. Venous cannulation was performed via the right atrial appendage using a two-staged venous cannula. An antegrade cardioplegia/vent cannula was inserted into the mid-ascending aorta. The patient was placed on cardiopulmonary bypass and the remainder of the pericardial adhesions were divided to free the heart.  A left ventricular vent was placed through the right superior pulmonary vein.  A retrograde cardioplegia cannula was inserted into the right atrium and advanced into the coronary sinus without difficulty.  Aortic occlusion was performed with a single cross-clamp. Systemic cooling to 32 degrees Centigrade and topical cooling of the heart with iced saline were used.  Antegrade and retrograde cold KBC cardioplegia was used to induce diastolic arrest and was then given at about 60 minute intervals throughout the period of arrest to maintain myocardial temperature at or below 10 degrees centigrade. A temperature probe was inserted into the interventricular septum and an insulating pad was placed in the pericardium.   Left internal mammary artery harvest:  The left side of the sternum was retracted using the Rultract retractor. The left internal mammary artery was harvested as a pedicle graft. All side branches were clipped. It was a medium-sized vessel of good quality with excellent blood flow. It was ligated distally and divided. It was sprayed with topical papaverine solution to prevent vasospasm.   Coronary arteries:  The coronary arteries were examined.  LAD:  medium caliber vessel with diffuse segmental disease LCX:  no stenosis RCA:  no stenosis   Grafts:  LIMA to the LAD: 1.75 mm. It was sewn end to side using 8-0 prolene continuous suture.   Aortic Valve Replacement:   A transverse aortotomy was performed 1 cm above the take-off of the right coronary artery. The native valve was bicuspid type 1 with a single raphae between the right and left  cusps with severely calcified leaflets and moderate annular calcification. The ostia of the coronary arteries were in normal position and were not obstructed. The native valve leaflets were excised and the annulus was decalcified with rongeurs. Care was taken to remove all particulate debris. The left ventricle was directly inspected for debris and then irrigated with ice saline solution. The annulus was sized and a size 27 mm Edwards INSPIRIS RESILIA pericardial valve was chosen. The model number was [id number] and the serial number was [id number].  While the valve was being prepared 2-0 Ethibond pledgeted horizontal mattress sutures were placed around the annulus with the pledgets in a sub-annular position. The sutures were placed through the sewing ring and the valve lowered into place. The sutures were tied using CorKnots. The valve seated nicely and the coronary ostia were not obstructed. The prosthetic valve leaflets moved normally and there was no sub-valvular obstruction. The aortotomy was closed using 4-0 Prolene suture in 2 layers with felt strips to reinforce the closure.      Completion:  The patient was rewarmed to 37 degrees Centigrade. The clamp was removed from the LIMA pedicle and there was rapid warming of the septum and return of ventricular fibrillation. The crossclamp was removed with a time of 126 minutes. There was spontaneous return of sinus rhythm. The distal and proximal anastomoses were checked for hemostasis. The position of the grafts was satisfactory. Two temporary epicardial pacing wires were placed on the right atrium and two on the right ventricle. The patient was weaned from CPB without difficulty on no inotropes. CPB time was 161 minutes. Cardiac output was 5 LPM. TEE  showed a normally functioning aortic valve prosthesis with no paravalvular leak or central regurgitation. LVEF was normal. Heparin was fully reversed with protamine and the aortic and venous cannulas removed. Hemostasis  was achieved. Mediastinal and left pleural drainage tubes were placed. The sternum was closed with double #6 stainless steel wires. The fascia was closed with continuous # 1 vicryl suture. The subcutaneous tissue was closed with 2-0 vicryl continuous suture. The skin was closed with 3-0 vicryl subcuticular suture. All sponge, needle, and instrument counts were reported correct at the end of the case. Dry sterile dressings were placed over the incisions and around the chest tubes which were connected to pleurevac suction. The patient was then transported to the surgical intensive care unit in stable condition.

## 2021-04-03 NOTE — Interval H&P Note (Signed)
History and Physical Interval Note:  04/03/2021 7:05 AM  [name]  has presented today for surgery, with the diagnosis of CAD SEVERE AS.  The various methods of treatment have been discussed with the patient and family. After consideration of risks, benefits and other options for treatment, the patient has consented to  Procedure(s): AORTIC VALVE REPLACEMENT (AVR) (N/A) CORONARY ARTERY BYPASS GRAFTING (CABG) (N/A) TRANSESOPHAGEAL ECHOCARDIOGRAM (TEE) (N/A) as a surgical intervention.  The patient's history has been reviewed, patient examined, no change in status, stable for surgery.  I have reviewed the patient's chart and labs.  Questions were answered to the patient's satisfaction.     Gaye Pollack

## 2021-04-03 NOTE — Anesthesia Procedure Notes (Signed)
Central Venous Catheter Insertion Performed by: Santa Lighter, MD, anesthesiologist Start/End2/10/2021 7:20 AM, 04/03/2021 7:25 AM Patient location: Pre-op. Preanesthetic checklist: patient identified, IV checked, site marked, risks and benefits discussed, surgical consent, monitors and equipment checked, pre-op evaluation and timeout performed Position: Trendelenburg Hand hygiene performed  and maximum sterile barriers used  Total catheter length 100. PA cath was placed.Swan type:thermodilution PA Cath depth:55 Procedure performed without using ultrasound guided technique. Attempts: 1 Patient tolerated the procedure well with no immediate complications.

## 2021-04-03 NOTE — Progress Notes (Signed)
°  Transition of Care [facility]) Screening Note   Patient Details  Name: [name] Date of Birth: 10/03/1960   Transition of Care [facility]) CM/SW Contact:    Milas Gain, [place] Phone Number: 04/03/2021, 4:32 PM    Transition of Care Department [facility]) has reviewed patient and no TOC needs have been identified at this time. We will continue to monitor patient advancement through interdisciplinary progression rounds. If new patient transition needs arise, please place a TOC consult.

## 2021-04-03 NOTE — Anesthesia Procedure Notes (Signed)
Central Venous Catheter Insertion Performed by: Santa Lighter, MD, anesthesiologist Start/End2/10/2021 7:10 AM, 04/03/2021 7:20 AM Patient location: Pre-op. Preanesthetic checklist: patient identified, IV checked, site marked, risks and benefits discussed, surgical consent, monitors and equipment checked, pre-op evaluation, timeout performed and anesthesia consent Position: Trendelenburg Lidocaine 1% used for infiltration and patient sedated Hand hygiene performed , maximum sterile barriers used  and Seldinger technique used Catheter size: 9 Fr Central line was placed.MAC introducer Procedure performed using ultrasound guided technique. Ultrasound Notes:anatomy identified, needle tip was noted to be adjacent to the nerve/plexus identified, no ultrasound evidence of intravascular and/or intraneural injection and image(s) printed for medical record Attempts: 1 Following insertion, line sutured, dressing applied and Biopatch. Post procedure assessment: free fluid flow, blood return through all ports and no air  Patient tolerated the procedure well with no immediate complications.

## 2021-04-03 NOTE — Procedures (Signed)
Extubation Procedure Note  Patient Details:   Name: [name] DOB: 24-Nov-1960 MRN: [id number]   Airway Documentation:    Vent end date: 04/03/21 Vent end time: 1825   Evaluation  O2 sats: stable throughout Complications: No apparent complications Patient did tolerate procedure well. Bilateral Breath Sounds: Clear, Diminished   Yes  Patient extubated to 4L [place]. Patient able to speak. Patient had a positive cuff leak prior to extubation. Patient does not have stridor at this time.   Lynann Bologna 04/03/2021, 6:29 PM

## 2021-04-03 NOTE — Anesthesia Procedure Notes (Signed)
Procedure Name: Intubation Date/Time: 04/03/2021 7:49 AM Performed by: Lorie Phenix, CRNA Pre-anesthesia Checklist: Patient identified, Emergency Drugs available, Suction available and Patient being monitored Patient Re-evaluated:Patient Re-evaluated prior to induction Oxygen Delivery Method: Circle system utilized Preoxygenation: Pre-oxygenation with 100% oxygen Induction Type: IV induction Ventilation: Mask ventilation without difficulty and Oral airway inserted - appropriate to patient size Laryngoscope Size: Mac and 4 Grade View: Grade III Tube type: Oral Tube size: 8.0 mm Number of attempts: 1 Airway Equipment and Method: Stylet Placement Confirmation: ETT inserted through vocal cords under direct vision, positive ETCO2 and breath sounds checked- equal and bilateral Secured at: 23 cm Tube secured with: Tape Dental Injury: Teeth and Oropharynx as per pre-operative assessment

## 2021-04-03 NOTE — Transfer of Care (Signed)
Immediate Anesthesia Transfer of Care Note  Patient: [name]  Procedure(s) Performed: AORTIC VALVE REPLACEMENT (AVR) USING 27 MM INSPIRIS RESILIA  AORTIC VALVE (Chest) CORONARY ARTERY BYPASS GRAFTING (CABG) x ONE ON CARDIOPULMONARY BYPASS. LIMA TO LAD (Chest) TRANSESOPHAGEAL ECHOCARDIOGRAM (TEE) APPLICATION OF CELL SAVER  Patient Location: SICU  Anesthesia Type:General  Level of Consciousness: Patient remains intubated per anesthesia plan  Airway & Oxygen Therapy: Patient remains intubated per anesthesia plan and Patient placed on Ventilator (see vital sign flow sheet for setting)  Post-op Assessment: Report given to RN and Post -op Vital signs reviewed and stable  Post vital signs: Reviewed and stable  Last Vitals:  Vitals Value Taken Time  BP 102/84 04/03/21 1345  Temp    Pulse    Resp 22 04/03/21 1353  SpO2 98 % 04/03/21 1353  Vitals shown include unvalidated device data.  Last Pain:  Vitals:   04/03/21 0658  TempSrc: Oral  PainSc: 0-No pain      Patients Stated Pain Goal: 0 (67/67/20 9470)  Complications: No notable events documented.

## 2021-04-03 NOTE — Anesthesia Procedure Notes (Signed)
Arterial Line Insertion Start/End2/10/2021 7:00 AM, 04/03/2021 7:05 AM Performed by: CRNA  Patient location: Pre-op. Preanesthetic checklist: patient identified, IV checked, site marked, risks and benefits discussed, surgical consent, monitors and equipment checked, pre-op evaluation, timeout performed and anesthesia consent Lidocaine 1% used for infiltration Right, radial was placed Catheter size: 20 G Hand hygiene performed  and maximum sterile barriers used   Attempts: 1 Procedure performed without using ultrasound guided technique. Following insertion, dressing applied and Biopatch. Post procedure assessment: normal  Patient tolerated the procedure well with no immediate complications.

## 2021-04-03 NOTE — Progress Notes (Signed)
NIF: -25 VC: 1.2

## 2021-04-03 NOTE — Progress Notes (Signed)
EVENING ROUNDS NOTE :     [address]       [place] [address]             [phone]                 Day of Surgery Procedure(s) (LRB): AORTIC VALVE REPLACEMENT (AVR) USING 27 MM INSPIRIS RESILIA  AORTIC VALVE (N/A) CORONARY ARTERY BYPASS GRAFTING (CABG) x ONE ON CARDIOPULMONARY BYPASS. LIMA TO LAD (N/A) TRANSESOPHAGEAL ECHOCARDIOGRAM (TEE) (N/A) APPLICATION OF CELL SAVER (N/A)   Total Length of Stay:  LOS: 0 days  Events:   Slowly waking up Minimal CT output On 8 of Neo Weaning to exubate    BP 102/84    Pulse 77    Temp 97.9 F (36.6 C)    Resp 15    Ht 5\' 8"  (1.727 m)    Wt 92.5 kg    SpO2 100%    BMI 31.02 kg/m   PAP: (16-268)/(0-246) 21/9 CO:  [3.4 L/min-3.8 L/min] 3.8 L/min CI:  [1.6 L/min/m2-1.9 L/min/m2] 1.9 L/min/m2  Vent Mode: SIMV;PRVC;PSV FiO2 (%):  [50 %] 50 % Set Rate:  [12 bmp-18 bmp] 18 bmp Vt Set:  [550 mL] 550 mL PEEP:  [5 cmH20] 5 cmH20 Pressure Support:  [10 cmH20] 10 cmH20 Plateau Pressure:  [18 cmH20-19 cmH20] 18 cmH20   sodium chloride     [START ON 04/04/2021] sodium chloride 10 mL/hr at 04/03/21 1600   sodium chloride 10 mL/hr at 04/03/21 1359   albumin human 12.5 g (04/03/21 1510)    ceFAZolin (ANCEF) IV Stopped (04/03/21 1544)   dexmedetomidine (PRECEDEX) IV infusion 0.2 mcg/kg/hr (04/03/21 1600)   famotidine (PEPCID) IV Stopped (04/03/21 1435)   insulin 2.2 Units/hr (04/03/21 1600)   lactated ringers     lactated ringers     lactated ringers 20 mL/hr at 04/03/21 1415   magnesium sulfate 20 mL/hr at 04/03/21 1600   nitroGLYCERIN     phenylephrine (NEO-SYNEPHRINE) Adult infusion 5.3333 mcg/min (04/03/21 1600)   vancomycin      No intake/output data recorded.   CBC Latest Ref Rng & Units 04/03/2021 04/03/2021 04/03/2021  WBC 4.0 - 10.5 K/uL - 17.8(H) -  Hemoglobin 13.0 - 17.0 g/dL 11.9(L) 12.0(L) 11.2(L)  Hematocrit 39.0 - 52.0 % 35.0(L) 35.8(L) 33.0(L)  Platelets 150 - 400 K/uL - 160 -    BMP Latest Ref Rng &  Units 04/03/2021 04/03/2021 04/03/2021  Glucose 70 - 99 mg/dL - 201(H) -  BUN 6 - 20 mg/dL - 23(H) -  Creatinine 0.61 - 1.24 mg/dL - 0.80 -  Sodium 135 - 145 mmol/L 139 136 137  Potassium 3.5 - 5.1 mmol/L 4.6 5.2(H) 5.2(H)  Chloride 98 - 111 mmol/L - 102 -  CO2 22 - 32 mmol/L - - -  Calcium 8.9 - 10.3 mg/dL - - -    ABG    Component Value Date/Time   PHART 7.264 (L) 04/03/2021 1355   PCO2ART 54.9 (H) 04/03/2021 1355   PO2ART 118 (H) 04/03/2021 1355   HCO3 25.0 04/03/2021 1355   TCO2 27 04/03/2021 1355   ACIDBASEDEF 3.0 (H) 04/03/2021 1355   O2SAT 98.0 04/03/2021 1355       Melodie Bouillon, MD 04/03/2021 5:02 PM

## 2021-04-03 NOTE — Hospital Course (Addendum)
°   PCP is [name], MD Referring Provider is Lauree Chandler D   HPI:   The patient is a 61 year old gentleman with history of diabetes, hypertension, cirrhosis of the liver status post liver transplant on 09/16/2018 at [facility], OSA on CPAP, coronary artery disease, and bicuspid aortic valve disease with severe stenosis who has been followed by [name] and was referred to the structural heart valve clinic for treatment of his aortic stenosis.  He had mild coronary disease by catheterization 2020 prior to his transplant.  He has been followed for moderate aortic stenosis.  His most recent echocardiogram on 11/25/2020 showed a thickened and calcified aortic valve with restricted leaflet mobility.  The mean gradient was 31 mmHg with a peak gradient of 54 mmHg.  Stroke-volume index was 32.  Aortic valve area was 0.8 cm with a dimensionless index of 0.22 consistent with severe aortic stenosis.  Left ventricular ejection fraction was 60 to 65% with moderate LVH.   He reports progressive exertional shortness of breath and fatigue.  He has had no chest pain or pressure.  He denies any peripheral edema.  He has had intermittent dizziness for several years.   He was being worked up for consideration of TAVR and CTA of the chest showed multiple nonobstructive pulmonary emboli bilaterally.  He therefore underwent a dedicated PE protocol CT which showed no central pulmonary emboli.  He was started on Eliquis with a plan for 3 months of anticoagulation.   The CT scan also showed signs of hepatic cirrhosis with a hypervascular area of concern peripherally within segment 5.  It was recommended that he have an abdominal MRI with and without IV gadolinium to rule out neoplasm.  This has apparently been scheduled through his transplant physician at [facility] Dr. [name].  Patient overall relevant studies were reviewed by Dr. Cyndia Bent including a right and left heart catheterization with coronary angiography which  revealed a significant mid LAD lesion of 80%.  The patient was felt to be a candidate for aortic valve replacement and CABG.  He was admitted this hospitalization electively for the procedure.  Hospital course:  The patient was admitted electively and taken to the operating room at which time he underwent aortic valve replacement using a 27 mm Inspiris Resilia aortic valve.  Additionally underwent CABG x1 with a LIMA to LAD graft.  He tolerated the procedure well was taken to the surgical intensive care unit in stable condition.  Postoperative hospital course:  The patient was extubated using standard post cardiac surgical protocols without difficulty.  He has maintained stable hemodynamics and all routine lines, monitors and drainage devices have been discontinued in the standard fashion.  He has been resumed on his CellCept and Prograf for liver transplant.  He has an expected postoperative volume overload and is being managed with diuretics.  Blood sugars have been under standard control using routine protocols.  He is maintaining normal sinus rhythm. On postop day 2, he was transferred to 4E progressive care.  His appetite remained poor for a few days.  He did have return of appropriate bowel function.  Ambulation and pulmonary hygiene were encouraged.  By the morning of the third postoperative day, he was maintaining acceptable oxygen saturation on room air.  Incisions were healing well without evidence of infection.  He was tolerating routine cardiac rehab modalities.  Overall at the time of discharge the patient was felt to be quite stable.

## 2021-04-03 NOTE — Progress Notes (Signed)
°  Echocardiogram Echocardiogram Transesophageal has been performed.  [name] 04/03/2021, 8:47 AM

## 2021-04-03 NOTE — Brief Op Note (Signed)
04/03/2021  1:49 PM  PATIENT:  [name]  61 y.o. male  PRE-OPERATIVE DIAGNOSIS:  CAD SEVERE AS  POST-OPERATIVE DIAGNOSIS:  CAD SEVERE AS  PROCEDURE:  Procedure(s): AORTIC VALVE REPLACEMENT (AVR) USING 27 MM INSPIRIS RESILIA  AORTIC VALVE (N/A) CORONARY ARTERY BYPASS GRAFTING (CABG) x ONE ON CARDIOPULMONARY BYPASS. LIMA TO LAD (N/A) TRANSESOPHAGEAL ECHOCARDIOGRAM (TEE) (N/A) APPLICATION OF CELL SAVER (N/A)  SURGEON:  Surgeon(s) and Role:    * Bartle, Fernande Boyden, MD - Primary  PHYSICIAN ASSISTANT: Cortland Crehan PA-C  ASSISTANTS: STAFF   ANESTHESIA:   general  EBL:  1123 mL   BLOOD ADMINISTERED:none  DRAINS:  LEFT PLEURAL AND MEDIASTINAL CHEST TUBES    LOCAL MEDICATIONS USED:  NONE  SPECIMEN:  Source of Specimen:  AORTIC VALVE LEAFLETS  DISPOSITION OF SPECIMEN:  PATHOLOGY  COUNTS:  YES  TOURNIQUET:  * No tourniquets in log *  DICTATION: .Dragon Dictation  PLAN OF CARE: Admit to inpatient   PATIENT DISPOSITION:  ICU - intubated and hemodynamically stable.   Delay start of Pharmacological VTE agent (>24hrs) due to surgical blood loss or risk of bleeding: yes  COMPLICATIONS: NO KNOWN

## 2021-04-03 NOTE — Plan of Care (Signed)
°  Problem: Education: Goal: Knowledge of General Education information will improve Description: Including pain rating scale, medication(s)/side effects and non-pharmacologic comfort measures Outcome: Progressing   Problem: Health Behavior/Discharge Planning: Goal: Ability to manage health-related needs will improve Outcome: Progressing   Problem: Clinical Measurements: Goal: Ability to maintain clinical measurements within normal limits will improve Outcome: Progressing Goal: Will remain free from infection Outcome: Progressing Goal: Diagnostic test results will improve Outcome: Progressing Goal: Respiratory complications will improve Outcome: Progressing Goal: Cardiovascular complication will be avoided Outcome: Progressing   Problem: Activity: Goal: Risk for activity intolerance will decrease Outcome: Progressing   Problem: Nutrition: Goal: Adequate nutrition will be maintained Outcome: Progressing   Problem: Coping: Goal: Level of anxiety will decrease Outcome: Progressing   Problem: Pain Managment: Goal: General experience of comfort will improve Outcome: Progressing   Problem: Safety: Goal: Ability to remain free from injury will improve Outcome: Progressing   Problem: Skin Integrity: Goal: Risk for impaired skin integrity will decrease Outcome: Progressing   Problem: Education: Goal: Will demonstrate proper wound care and an understanding of methods to prevent future damage Outcome: Progressing Goal: Knowledge of disease or condition will improve Outcome: Progressing Goal: Knowledge of the prescribed therapeutic regimen will improve Outcome: Progressing Goal: Individualized Educational Video(s) Outcome: Progressing   Problem: Activity: Goal: Risk for activity intolerance will decrease Outcome: Progressing   Problem: Cardiac: Goal: Will achieve and/or maintain hemodynamic stability Outcome: Progressing   Problem: Clinical Measurements: Goal:  Postoperative complications will be avoided or minimized Outcome: Progressing   Problem: Respiratory: Goal: Respiratory status will improve Outcome: Progressing   Problem: Skin Integrity: Goal: Wound healing without signs and symptoms of infection Outcome: Progressing Goal: Risk for impaired skin integrity will decrease Outcome: Progressing   Problem: Urinary Elimination: Goal: Ability to achieve and maintain adequate renal perfusion and functioning will improve Outcome: Progressing

## 2021-04-04 ENCOUNTER — Inpatient Hospital Stay (HOSPITAL_COMMUNITY): Payer: Medicare Other

## 2021-04-04 ENCOUNTER — Encounter (HOSPITAL_COMMUNITY): Payer: Self-pay | Admitting: Surgery

## 2021-04-04 ENCOUNTER — Other Ambulatory Visit: Payer: Self-pay | Admitting: Student

## 2021-04-04 DIAGNOSIS — Z952 Presence of prosthetic heart valve: Secondary | ICD-10-CM

## 2021-04-04 DIAGNOSIS — I350 Nonrheumatic aortic (valve) stenosis: Secondary | ICD-10-CM

## 2021-04-04 LAB — BASIC METABOLIC PANEL
Anion gap: 8 (ref 5–15)
Anion gap: 8 (ref 5–15)
BUN: 17 mg/dL (ref 6–20)
BUN: 16 mg/dL (ref 6–20)
CO2: 22 mmol/L (ref 22–32)
CO2: 26 mmol/L (ref 22–32)
Calcium: 7.4 mg/dL — ABNORMAL LOW (ref 8.9–10.3)
Calcium: 7.4 mg/dL — ABNORMAL LOW (ref 8.9–10.3)
Chloride: 105 mmol/L (ref 98–111)
Chloride: 101 mmol/L (ref 98–111)
Creatinine, Ser: 0.94 mg/dL (ref 0.61–1.24)
Creatinine, Ser: 1.06 mg/dL (ref 0.61–1.24)
GFR, Estimated: >60 mL/min (ref >60)
GFR, Estimated: >60 mL/min (ref >60)
Glucose, Bld: 120 mg/dL — ABNORMAL HIGH (ref 70–99)
Glucose, Bld: 155 mg/dL — ABNORMAL HIGH (ref 70–99)
Potassium: 4.6 mmol/L (ref 3.5–5.1)
Potassium: 4.2 mmol/L (ref 3.5–5.1)
Sodium: 135 mmol/L (ref 135–145)
Sodium: 135 mmol/L (ref 135–145)

## 2021-04-04 LAB — CBC
HCT: 31.9 % — ABNORMAL LOW (ref 39.0–52.0)
HCT: 31.2 % — ABNORMAL LOW (ref 39.0–52.0)
Hemoglobin: 10.6 g/dL — ABNORMAL LOW (ref 13.0–17.0)
Hemoglobin: 10.2 g/dL — ABNORMAL LOW (ref 13.0–17.0)
MCH: 29.9 pg (ref 26.0–34.0)
MCH: 29.9 pg (ref 26.0–34.0)
MCHC: 33.2 g/dL (ref 30.0–36.0)
MCHC: 32.7 g/dL (ref 30.0–36.0)
MCV: 89.9 fL (ref 80.0–100.0)
MCV: 91.5 fL (ref 80.0–100.0)
Platelets: 149 10*3/uL — ABNORMAL LOW (ref 150–400)
Platelets: 132 10*3/uL — ABNORMAL LOW (ref 150–400)
RBC: 3.55 MIL/uL — ABNORMAL LOW (ref 4.22–5.81)
RBC: 3.41 MIL/uL — ABNORMAL LOW (ref 4.22–5.81)
RDW: 12.8 % (ref 11.5–15.5)
RDW: 13.2 % (ref 11.5–15.5)
WBC: 13.3 10*3/uL — ABNORMAL HIGH (ref 4.0–10.5)
WBC: 11.5 10*3/uL — ABNORMAL HIGH (ref 4.0–10.5)
nRBC: 0 % (ref 0.0–0.2)
nRBC: 0.2 % (ref 0.0–0.2)

## 2021-04-04 LAB — GLUCOSE, CAPILLARY
Glucose-Capillary: 110 mg/dL — ABNORMAL HIGH (ref 70–99)
Glucose-Capillary: 110 mg/dL — ABNORMAL HIGH (ref 70–99)
Glucose-Capillary: 116 mg/dL — ABNORMAL HIGH (ref 70–99)
Glucose-Capillary: 122 mg/dL — ABNORMAL HIGH (ref 70–99)
Glucose-Capillary: 129 mg/dL — ABNORMAL HIGH (ref 70–99)
Glucose-Capillary: 130 mg/dL — ABNORMAL HIGH (ref 70–99)
Glucose-Capillary: 153 mg/dL — ABNORMAL HIGH (ref 70–99)
Glucose-Capillary: 179 mg/dL — ABNORMAL HIGH (ref 70–99)
Glucose-Capillary: 190 mg/dL — ABNORMAL HIGH (ref 70–99)

## 2021-04-04 LAB — MAGNESIUM
Magnesium: 2.5 mg/dL — ABNORMAL HIGH (ref 1.7–2.4)
Magnesium: 2.2 mg/dL (ref 1.7–2.4)

## 2021-04-04 LAB — COOXEMETRY PANEL
Carboxyhemoglobin: 1.4 % (ref 0.5–1.5)
Methemoglobin: 0.9 % (ref 0.0–1.5)
O2 Saturation: 63 %
Total hemoglobin: 10.9 g/dL — ABNORMAL LOW (ref 12.0–16.0)

## 2021-04-04 LAB — SURGICAL PATHOLOGY

## 2021-04-04 MED ORDER — ENOXAPARIN SODIUM 40 MG/0.4ML IJ SOSY
40.0000 mg | PREFILLED_SYRINGE | Freq: Every day | INTRAMUSCULAR | Status: DC
  Administered 2021-04-04 – 2021-04-06 (×3): 40 mg via SUBCUTANEOUS
  Filled 2021-04-04 (×3): qty 0.4

## 2021-04-04 MED ORDER — ORAL CARE MOUTH RINSE
15.0000 mL | Freq: Two times a day (BID) | OROMUCOSAL | Status: DC
  Administered 2021-04-04 – 2021-04-06 (×4): 15 mL via OROMUCOSAL

## 2021-04-04 MED ORDER — INSULIN DETEMIR 100 UNIT/ML ~~LOC~~ SOLN: 30.0000 [IU] | Freq: Every day | SUBCUTANEOUS | Status: DC

## 2021-04-04 MED ORDER — FUROSEMIDE 10 MG/ML IJ SOLN
40.0000 mg | Freq: Once | INTRAMUSCULAR | Status: AC
  Administered 2021-04-04: 40 mg via INTRAVENOUS
  Filled 2021-04-04: qty 4

## 2021-04-04 MED ORDER — INSULIN ASPART 100 UNIT/ML IJ SOLN
0.0000 [IU] | INTRAMUSCULAR | Status: DC
  Administered 2021-04-04 (×2): 4 [IU] via SUBCUTANEOUS
  Administered 2021-04-04 – 2021-04-05 (×4): 2 [IU] via SUBCUTANEOUS

## 2021-04-04 MED ORDER — FUROSEMIDE 10 MG/ML IJ SOLN
40.0000 mg | Freq: Once | INTRAMUSCULAR | Status: AC
Start: 2021-04-04 — End: 2021-04-04
  Administered 2021-04-04: 40 mg via INTRAVENOUS
  Filled 2021-04-04: qty 4

## 2021-04-04 MED ORDER — POTASSIUM CHLORIDE CRYS ER 20 MEQ PO TBCR
40.0000 meq | EXTENDED_RELEASE_TABLET | Freq: Once | ORAL | Status: AC
  Administered 2021-04-04: 40 meq via ORAL
  Filled 2021-04-04: qty 2

## 2021-04-04 MED ORDER — INSULIN DETEMIR 100 UNIT/ML ~~LOC~~ SOLN
30.0000 [IU] | Freq: Every day | SUBCUTANEOUS | Status: DC
  Administered 2021-04-04 – 2021-04-07 (×4): 30 [IU] via SUBCUTANEOUS
  Filled 2021-04-04 (×4): qty 0.3

## 2021-04-04 MED FILL — Thrombin (Recombinant) For Soln 20000 Unit: CUTANEOUS | Qty: 1 | Status: AC

## 2021-04-04 NOTE — Discharge Summary (Signed)
[address]       [place] [address]             [phone]    Physician Discharge Summary  Patient ID: [name] MRN: [id number] DOB/AGE: [birth year] 61 y.o.  Admit date: 04/03/2021 Discharge date: 04/08/2021  Admission Diagnoses:  Patient Active Problem List   Diagnosis Date Noted   S/P AVR (aortic valve replacement) 04/03/2021   Osteopenia 03/19/2021   Left hand pain 03/19/2021   Coronary artery disease involving native coronary artery of native heart without angina pectoris    Severe aortic stenosis    Welcome to Medicare preventive visit 11/20/2020   HLD (hyperlipidemia) 11/12/2019   Liver transplant recipient [facility]) 09/18/2018   Nephrolithiasis 07/28/2018   Arthralgia 10/25/2017   Diabetes mellitus without complication ([place]) 10/62/6948   Mucosal abnormality of stomach    Advance care planning 02/12/2015   Flying phobia 54/59/7035   Umbilical hernia 00/59/8182   Asthma 09/14/2011   OSA (obstructive sleep apnea) 09/14/2011   FH: prostate cancer 02/25/2011   Routine general medical examination at a health care facility 02/25/2011   Bicuspid aortic valve 02/25/2011   Rash 09/25/2009   PURE HYPERCHOLESTEROLEMIA 02/14/2007   LOSS, HEARING NOS 08/11/2006   Hypothyroidism 08/09/2006   Essential hypertension 08/09/2006   ALLERGIC RHINITIS 08/09/2006     Discharge Diagnoses:  Patient Active Problem List   Diagnosis Date Noted   S/P AVR (aortic valve replacement) 04/03/2021   Osteopenia 03/19/2021   Left hand pain 03/19/2021   Coronary artery disease involving native coronary artery of native heart without angina pectoris    Severe aortic stenosis    Welcome to Medicare preventive visit 11/20/2020   HLD (hyperlipidemia) 11/12/2019   Liver transplant recipient [facility]) 09/18/2018   Nephrolithiasis 07/28/2018   Arthralgia 10/25/2017   Diabetes mellitus without complication ([place]) 99/37/1696   Mucosal abnormality of stomach    Advance care  planning 02/12/2015   Flying phobia 78/59/8101   Umbilical hernia 75/59/2585   Asthma 09/14/2011   OSA (obstructive sleep apnea) 09/14/2011   FH: prostate cancer 02/25/2011   Routine general medical examination at a health care facility 02/25/2011   Bicuspid aortic valve 02/25/2011   Rash 09/25/2009   PURE HYPERCHOLESTEROLEMIA 02/14/2007   LOSS, HEARING NOS 08/11/2006   Hypothyroidism 08/09/2006   Essential hypertension 08/09/2006   ALLERGIC RHINITIS 08/09/2006     Discharged Condition: good     PCP is [name], MD Referring Provider is [name]   HPI:   The patient is a 61 year old gentleman with history of diabetes, hypertension, cirrhosis of the liver status post liver transplant on 09/16/2018 at [facility], OSA on CPAP, coronary artery disease, and bicuspid aortic valve disease with severe stenosis who has been followed by [name] and was referred to the structural heart valve clinic for treatment of his aortic stenosis.  He had mild coronary disease by catheterization 2020 prior to his transplant.  He has been followed for moderate aortic stenosis.  His most recent echocardiogram on 11/25/2020 showed a thickened and calcified aortic valve with restricted leaflet mobility.  The mean gradient was 31 mmHg with a peak gradient of 54 mmHg.  Stroke-volume index was 32.  Aortic valve area was 0.8 cm with a dimensionless index of 0.22 consistent with severe aortic stenosis.  Left ventricular ejection fraction was 60 to 65% with moderate LVH.   He reports progressive exertional shortness of breath and fatigue.  He  has had no chest pain or pressure.  He denies any peripheral edema.  He has had intermittent dizziness for several years.   He was being worked up for consideration of TAVR and CTA of the chest showed multiple nonobstructive pulmonary emboli bilaterally.  He therefore underwent a dedicated PE protocol CT which showed no central pulmonary emboli.  He was started on  Eliquis with a plan for 3 months of anticoagulation.   The CT scan also showed signs of hepatic cirrhosis with a hypervascular area of concern peripherally within segment 5.  It was recommended that he have an abdominal MRI with and without IV gadolinium to rule out neoplasm.  This has apparently been scheduled through his transplant physician at [facility] Dr. [name].  Patient overall relevant studies were reviewed by Dr. Cyndia Bent including a right and left heart catheterization with coronary angiography which revealed a significant mid LAD lesion of 80%.  The patient was felt to be a candidate for aortic valve replacement and CABG.  He was admitted this hospitalization electively for the procedure.  Hospital course:  The patient was admitted electively and taken to the operating room at which time he underwent aortic valve replacement using a 27 mm Inspiris Resilia aortic valve.  Additionally underwent CABG x1 with a LIMA to LAD graft.  He tolerated the procedure well was taken to the surgical intensive care unit in stable condition.  Postoperative hospital course:  The patient was extubated using standard post cardiac surgical protocols without difficulty.  He has maintained stable hemodynamics and all routine lines, monitors and drainage devices have been discontinued in the standard fashion.  He has been resumed on his CellCept and Prograf for liver transplant.  He has an expected postoperative volume overload and is being managed with diuretics.  Blood sugars have been under standard control using routine protocols.  He is maintaining normal sinus rhythm. On postop day 2, he was transferred to 4E progressive care.  His appetite remained poor for a few days.  He did have return of appropriate bowel function.  Ambulation and pulmonary hygiene were encouraged.  By the morning of the third postoperative day, he was maintaining acceptable oxygen saturation on room air.  Incisions were healing well without  evidence of infection.  He was tolerating routine cardiac rehab modalities.  Overall at the time of discharge the patient was felt to be quite stable.   Consults: None  Significant Diagnostic Studies:  DG Chest 2 View  Result Date: 04/02/2021 CLINICAL DATA:  Pre admit prior to open heart surgery. EXAM: CHEST - 2 VIEW COMPARISON:  September 08, 2018 FINDINGS: The heart, hila, and mediastinum are unchanged and unremarkable. No pneumothorax. No nodules or masses. No focal infiltrates. IMPRESSION: No active cardiopulmonary disease. Electronically Signed   By: Dorise Bullion III M.D.   On: 04/02/2021 06:29   DG Chest Port 1 View  Result Date: 04/05/2021 CLINICAL DATA:  Status post aortic valve replacement EXAM: PORTABLE CHEST 1 VIEW COMPARISON:  04/04/2021 FINDINGS: Unchanged mild cardiomegaly. Lungs are clear. No significant pulmonary vascular congestion. Postsurgical changes of valve replacement again seen. Interval removal of Swan-Ganz catheter. Right IJ sheath remains in place. Retained epicardial leads again seen. Flu IMPRESSION: Unchanged mild cardiomegaly. Electronically Signed   By: Miachel Roux M.D.   On: 04/05/2021 12:40   DG Chest Port 1 View  Result Date: 04/04/2021 CLINICAL DATA:  S/P AVR (aortic valve replacement) Z95.2 (ICD-10-CM) S/P CABG x 1 Z95.1 (ICD-10-CM) Surgery follow-up Z09 (ICD-10-CM)  EXAM: PORTABLE CHEST 1 VIEW COMPARISON:  April 03, 2021. FINDINGS: Low lung volumes. Streaky bibasilar opacities. Possible trace bilateral pleural effusions. No visible pneumothorax on this semi erect radiograph. Similar position a right IJ approach Swan-Ganz catheter. Mediastinal drain and left chest tube in similar position. Possible kinking of the left chest tube is similar. Interval extubation. Enlarged cardiac silhouette, aortic valve replacement, and median sternotomy. IMPRESSION: 1. Low lung volumes with streaky bibasilar opacities, likely atelectasis. 2. Possible trace bilateral pleural  effusions. 3. Support devices as detailed above. Electronically Signed   By: Margaretha Sheffield M.D.   On: 04/04/2021 08:03   DG Chest Port 1 View  Result Date: 04/03/2021 CLINICAL DATA:  Status post CABG and AVR. EXAM: PORTABLE CHEST 1 VIEW COMPARISON:  Chest x-ray dated April 01, 2021. FINDINGS: Endotracheal tube in position with the tip 4.0 cm above the level of the carina. Enteric tube entering the stomach with the tip below the field of view. Right internal jugular Swan-Ganz catheter with the tip in the main pulmonary outflow tract. Mediastinal and left chest tubes are in good position. Interval CABG and AVR. Normal heart size. Normal pulmonary vascularity. Mild bibasilar atelectasis. Possible trace right pleural effusion. No pneumothorax. No acute osseous abnormality. IMPRESSION: 1. Appropriate positioning of the lines and tubes. 2. Mild bibasilar atelectasis. Possible trace right pleural effusion. Electronically Signed   By: Titus Dubin M.D.   On: 04/03/2021 15:00   DG Hand Complete Left  Result Date: 03/17/2021 CLINICAL DATA:  Left hand pain. EXAM: LEFT HAND - COMPLETE 3+ VIEW COMPARISON:  None. FINDINGS: Mild triscaphe and thumb carpometacarpal joint space narrowing. No cortical erosion. Mild distal trapezium subchondral degenerative cystic change at the thumb carpometacarpal joint. Mild approximate 2 mm lucency with mild surrounding sclerosis likely degenerative cystic change within the distal aspect of third metacarpal head. No acute fracture or dislocation. IMPRESSION: Mild thumb carpometacarpal, triscaphe, and third metacarpophalangeal joint osteoarthritis. Electronically Signed   By: Yvonne Kendall M.D.   On: 03/17/2021 18:15   ECHO INTRAOPERATIVE TEE  Result Date: 04/07/2021  *INTRAOPERATIVE TRANSESOPHAGEAL REPORT *  Patient Name:   FREMONT SKALICKY Date of Exam: 04/03/2021 Medical Rec #:  [id number]      Height:       68.0 in Accession #:    [id number]     Weight:       204.0 lb Date of  Birth:  Aug 14, 1960      BSA:          2.06 m Patient Age:    75 years       BP:           127`/69 mmHg Patient Gender: M              HR:           60 bpm. Exam Location:  Anesthesiology Transesophogeal exam was perform intraoperatively during surgical procedure. Patient was closely monitored under general anesthesia during the entirety of examination. Indications:     Coronary Artery Disease, Aortic Valve Disease Sonographer:     Bernadene Person RDCS Performing Phys: [id number] BARTLE Diagnosing Phys: Hoy Morn MD Complications: No known complications during this procedure. POST-OP IMPRESSIONS _ Left Ventricle: The left ventricle is unchanged from pre-bypass. has normal systolic function, with an ejection fraction of 60%. _ Right Ventricle: The right ventricle appears unchanged from pre-bypass. _ Aorta: The aorta appears unchanged from pre-bypass. _ Left Atrial Appendage: The left atrial appendage appears unchanged from pre-bypass.  _ Aortic Valve: A pericardial bioprosthetic valve was placed, leaflets are freely mobile and leaflets thin Manufactured by; Edwards Size; 71mm. _ Mitral Valve: There is mild regurgitation. _ Tricuspid Valve: The tricuspid valve appears unchanged from pre-bypass. _ Pulmonic Valve: The pulmonic valve appears unchanged from pre-bypass. _ Interatrial Septum: The interatrial septum appears unchanged from pre-bypass. _ Pericardium: The pericardium appears unchanged from pre-bypass. _ Comments: Post-bypass images reviewed with surgeon. PRE-OP FINDINGS  Left Ventricle: The left ventricle has normal systolic function, with an ejection fraction of 60-65%. The cavity size was normal. There is mild concentric left ventricular hypertrophy. Right Ventricle: The right ventricle has normal systolic function. The cavity was normal. There is no increase in right ventricular wall thickness. Left Atrium: Left atrial size was dilated. No left atrial/left atrial appendage thrombus was detected. Left  atrial appendage velocity is normal at greater than 40 cm/s. Right Atrium: Right atrial size was normal in size. Interatrial Septum: No atrial level shunt detected by color flow Doppler. Pericardium: There is no evidence of pericardial effusion. Mitral Valve: The mitral valve is normal in structure. Mitral valve regurgitation is trivial by color flow Doppler. There is mild thickening and mild calcification present. Tricuspid Valve: The tricuspid valve was normal in structure. Tricuspid valve regurgitation was not visualized by color flow Doppler. Aortic Valve: The aortic valve is bicuspid Aortic valve regurgitation was not visualized by color flow Doppler. There is severe stenosis of the aortic valve, with a calculated valve area of 0.87 cm. Pulmonic Valve: The pulmonic valve was normal in structure. Pulmonic valve regurgitation is not visualized by color flow Doppler. Aorta: The aortic root, ascending aorta and aortic arch are normal in size and structure. Pulmonary Artery: The pulmonary artery is of normal size. +--------------+--------++  LEFT VENTRICLE            +--------------+--------++  PLAX 2D                   +--------------+--------++  LVIDd:         3.70 cm    +--------------+--------++  LVIDs:         2.30 cm    +--------------+--------++  LVOT diam:     2.90 cm    +--------------+--------++  LV SV:         40 ml      +--------------+--------++  LV SV Index:   18.74      +--------------+--------++  LVOT Area:     6.61 cm   +--------------+--------++                            +--------------+--------++ +------------------+------------++  AORTIC VALVE                      +------------------+------------++  AV Area (Vmax):    0.92 cm       +------------------+------------++  AV Area (Vmean):   0.78 cm       +------------------+------------++  AV Area (VTI):     0.87 cm       +------------------+------------++  AV Vmax:           406.00 cm/s    +------------------+------------++  AV Vmean:           327.000 cm/s   +------------------+------------++  AV VTI:            1.080 m        +------------------+------------++  AV Peak Grad:  65.9 mmHg      +------------------+------------++  AV Mean Grad:      46.0 mmHg      +------------------+------------++  LVOT Vmax:         56.50 cm/s     +------------------+------------++  LVOT Vmean:        38.600 cm/s    +------------------+------------++  LVOT VTI:          0.143 m        +------------------+------------++  LVOT/AV VTI ratio: 0.13           +------------------+------------++  +--------------+-------+  SHUNTS                  +--------------+-------+  Systemic VTI:  0.14 m   +--------------+-------+  Systemic Diam: 2.90 cm  +--------------+-------+  Hoy Morn MD Electronically signed by Hoy Morn MD Signature Date/Time: 04/07/2021/8:57:03 AM    Final    VAS US DOPPLER PRE CABG  Result Date: 04/01/2021 PREOPERATIVE VASCULAR EVALUATION Patient Name:  TAVYN KURKA  Date of Exam:   04/01/2021 Medical Rec #: [id number]       Accession #:    [id number] Date of Birth: [birth year]       Patient Gender: M Patient Age:   64 years Exam Location[facility]  [facility] Procedure:      VAS US DOPPLER PRE CABG Referring Phys: Gilford Raid --------------------------------------------------------------------------------  Indications:      Pre-AVR. Risk Factors:     Hypertension, hyperlipidemia, Diabetes, no history of smoking,                   coronary artery disease. Other Factors:    AO stenosis. Comparison Study: No previous exams Performing Technologist: Rogelia Rohrer RVT/RDMS  Examination Guidelines: A complete evaluation includes B-mode imaging, spectral Doppler, color Doppler, and power Doppler as needed of all accessible portions of each vessel. Bilateral testing is considered an integral part of a complete examination. Limited examinations for reoccurring indications may be performed as noted.  Right Carotid Findings:  +----------+--------+--------+--------+--------+------------------+             PSV cm/s EDV cm/s Stenosis Describe Comments            +----------+--------+--------+--------+--------+------------------+  CCA Prox   48       13                                             +----------+--------+--------+--------+--------+------------------+  CCA Distal 58       22                         intimal thickening  +----------+--------+--------+--------+--------+------------------+  ICA Prox   58       18                                             +----------+--------+--------+--------+--------+------------------+  ICA Distal 50       22                                             +----------+--------+--------+--------+--------+------------------+  ECA  52       11                                             +----------+--------+--------+--------+--------+------------------+ +----------+--------+-------+--------+------------+             PSV cm/s EDV cms Describe Arm Pressure  +----------+--------+-------+--------+------------+  Subclavian 63                                      +----------+--------+-------+--------+------------+ +---------+--------+--+--------+--+  Vertebral PSV cm/s 28 EDV cm/s 13  +---------+--------+--+--------+--+ Left Carotid Findings: +----------+--------+--------+--------+--------+------------------+             PSV cm/s EDV cm/s Stenosis Describe Comments            +----------+--------+--------+--------+--------+------------------+  CCA Prox   79       18                                             +----------+--------+--------+--------+--------+------------------+  CCA Distal 68       25                         intimal thickening  +----------+--------+--------+--------+--------+------------------+  ICA Prox   59       24                                             +----------+--------+--------+--------+--------+------------------+  ICA Distal 57       27                                              +----------+--------+--------+--------+--------+------------------+  ECA        60       15                                             +----------+--------+--------+--------+--------+------------------+ +----------+--------+--------+--------+------------+  Subclavian PSV cm/s EDV cm/s Describe Arm Pressure  +----------+--------+--------+--------+------------+             62                                       +----------+--------+--------+--------+------------+ +---------+--------+--+--------+--+  Vertebral PSV cm/s 31 EDV cm/s 10  +---------+--------+--+--------+--+  Right Doppler Findings: +----------+--------+-----+---------+--------+  Site       Pressure Index Doppler   Comments  +----------+--------+-----+---------+--------+  Subclavian 135            triphasic           +----------+--------+-----+---------+--------+  Radial                    triphasic           +----------+--------+-----+---------+--------+  Ulnar  triphasic           +----------+--------+-----+---------+--------+  Left Doppler Findings: +----------+--------+-----+---------+--------+  Site       Pressure Index Doppler   Comments  +----------+--------+-----+---------+--------+  Subclavian 143                                +----------+--------+-----+---------+--------+  Radial                    triphasic           +----------+--------+-----+---------+--------+  Ulnar                     triphasic           +----------+--------+-----+---------+--------+  Summary: Right Carotid: The extracranial vessels were near-normal with only minimal wall                thickening or plaque. Left Carotid: The extracranial vessels were near-normal with only minimal wall               thickening or plaque. Vertebrals:  Bilateral vertebral arteries demonstrate antegrade flow. Subclavians: Normal flow hemodynamics were seen in bilateral subclavian              arteries. Bilateral Extremity: Doppler waveforms remain within normal  limits with compression bilaterally for the radial arteries. Doppler waveforms remain within normal limits with compression bilaterally for the ulnar arteries.  Electronically signed by Servando Snare MD on 04/01/2021 at 2:37:20 PM.    Final      Treatments: surgery:  04/03/2021   Surgeon:  Gaye Pollack, MD   First Assistant: Jadene Pierini, PA-C:   An experienced assistant was required given the complexity of this surgery and the standard of surgical care. The assistant was needed for exposure, dissection, suctioning, retraction of delicate tissues and sutures, instrument exchange and for overall help during this procedure.     Preoperative Diagnosis:  Severe aortic stenosis and single-vessel coronary artery disease     Postoperative Diagnosis:  Same     Procedure:   Median Sternotomy Extracorporeal circulation 3.   Coronary artery bypass grafting x 1   Left internal mammary artery graft to the LAD 4.   Aortic valve replacement using a 27 mm Edwards INSPIRIS RESILIA pericardial valve.   Anesthesia:  General Endotracheal    Discharge Exam: Blood pressure 111/79, pulse 78, temperature 98.1 F (36.7 C), temperature source Oral, resp. rate 19, height 5\' 9"  (1.753 m), weight 89.4 kg, SpO2 96 %.  General appearance: alert, cooperative, and no distress Heart: regular rate and rhythm Lungs: clear to auscultation bilaterally Abdomen: benign Extremities: no edema Wound: incis healing well  Discharge Medications:  The patient has been discharged on: 1.Beta Blocker:  Yes [ y  ]                              No   [   ]                              If No, reason:  2.Ace Inhibitor/ARB: Yes [   ]                                     No  [  n  ]                                     If No, reason:BP well controlled   3.Statin:   Yes [ y  ]                  No  [   ]                  If No, reason:  4.Ecasa:  Yes  [  y ]                  No   [   ]                  If No,  reason:  Patient had ACS upon admission:n  Plavix/P2Y12 inhibitor: Yes [   ]                                      No  [n   ]     Discharge Instructions     Amb Referral to Cardiac Rehabilitation   Complete by: As directed    Diagnosis:  CABG Valve Replacement     Valve: Aortic   CABG X ___: 1   After initial evaluation and assessments completed: Virtual Based Care may be provided alone or in conjunction with Phase 2 Cardiac Rehab based on patient barriers.: Yes   Discharge patient   Complete by: As directed    Discharge disposition: 01-Home or Self Care   Discharge patient date: 04/07/2021      Allergies as of 04/07/2021       Reactions   Watermelon Flavor    Mouth itching        Medication List     STOP taking these medications    amLODipine 10 MG tablet Commonly known as: NORVASC   apixaban 5 MG Tabs tablet Commonly known as: ELIQUIS       TAKE these medications    acetaminophen 500 MG tablet Commonly known as: TYLENOL Take 500 mg by mouth every 6 (six) hours as needed for moderate pain or headache.   albuterol 108 (90 Base) MCG/ACT inhaler Commonly known as: VENTOLIN HFA Inhale 2 puffs into the lungs every 6 (six) hours as needed for wheezing or shortness of breath.   alendronate 70 MG tablet Commonly known as: FOSAMAX Take 70 mg by mouth every Sunday. Take with a full glass of water on an empty stomach.   aspirin 325 MG EC tablet Take 1 tablet (325 mg total) by mouth daily. What changed:  medication strength how much to take   azelastine 0.1 % nasal spray Commonly known as: ASTELIN PLACE 2 SPRAYS INTO BOTH NOSTRILS TWICE DAILY AS NEEDED   calcium carbonate 500 MG chewable tablet Commonly known as: TUMS - dosed in mg elemental calcium Chew 1 tablet by mouth daily.   empagliflozin 10 MG Tabs tablet Commonly known as: JARDIANCE Take 1 tablet (10 mg total) by mouth daily before breakfast.   ferrous [id number] C-folic acid  capsule Commonly known as: TRINSICON / FOLTRIN Take 1 capsule by mouth 2 (two) times daily after a meal.   fluticasone 50 MCG/ACT nasal spray Commonly known as: FLONASE PLACE ONE OR TWO SPRAYS INTO BOTH NOSTRILS DAILY AS  NEEDED.   levothyroxine 150 MCG tablet Commonly known as: SYNTHROID Take 1 tablet (150 mcg total) by mouth daily before breakfast. What changed: See the new instructions.   metFORMIN 500 MG 24 hr tablet Commonly known as: GLUCOPHAGE-XR Take 2 tablets (1,000 mg total) by mouth in the morning and at bedtime.   metoprolol tartrate 25 MG tablet Commonly known as: LOPRESSOR Take 1 tablet (25 mg total) by mouth 2 (two) times daily.   metroNIDAZOLE 1 % gel Commonly known as: METROGEL Apply 1 application topically daily as needed (rosacea).   mycophenolate 250 MG capsule Commonly known as: CELLCEPT Take 250 mg by mouth 2 (two) times daily.   OneTouch Delica Lancets 95J Misc USE TO CHECK SUGAR DAILY   OneTouch Ultra test strip Generic drug: glucose blood USE TO CHECK SUGAR DAILY   oxyCODONE 5 MG immediate release tablet Commonly known as: Oxy IR/ROXICODONE Take 1 tablet (5 mg total) by mouth every 6 (six) hours as needed for up to 7 days for severe pain.   pravastatin 40 MG tablet Commonly known as: PRAVACHOL Take 40 mg by mouth every evening.   sertraline 50 MG tablet Commonly known as: ZOLOFT Take 1 tablet (50 mg total) by mouth daily.   tacrolimus 1 MG capsule Commonly known as: PROGRAF Take 2 mg by mouth 2 (two) times daily.   Vitamin D 50 MCG (2000 UT) tablet Take 2,000 Units by mouth daily.        Follow-up Information     Gaye Pollack, MD Follow up.   Specialty: Cardiothoracic Surgery Why: Please see discharge paperwork for follow-up appointment with surgeon and nurse for suture removal.  On the date you see Dr. Cyndia Bent obtain a chest x-ray at [place] 1/2-hour prior to appointment.  It is located in the same office complex on  the first floor. Contact information: [address] [place] [address] [phone]         [name], MD Follow up.   Specialty: Cardiology Why: Please see discharge paperwork for follow-up appointment with cardiology. Contact information: [address] [place]  [address] [phone]         [facility] Follow up.   Specialty: Home Health Services Why: ([facility])-HH referral from surgeon office pre-op for any [facility] needs- they will follow up post discharge. Contact information: [name] [address] [phone]                 Signed: @mec @ 04/08/2021, 1:08 PM

## 2021-04-04 NOTE — Progress Notes (Signed)
Ordered 6 week post-op Echo following AVR per protocol. Primary Cardiologist: [name] CT Surgeon: Dr. Everardo Beals, PA-C 04/04/2021 2:35 PM

## 2021-04-04 NOTE — Progress Notes (Signed)
Patients home CPAP inspected and setup for patient at bedside. 3L oxygen bled into his home machine. Patient will place himself on/off when ready for CPAP.

## 2021-04-04 NOTE — Progress Notes (Signed)
Patient ID: [name], male   DOB: Sep 24, 1960, 61 y.o.   MRN: [id number] TCTS Evening Rounds:  Hemodynamically stable in sinus rhythm. Diuresed well today. Ambulated down the hall.  Chest tubes out.  Will give him another dose of lasix this evening.

## 2021-04-04 NOTE — Progress Notes (Signed)
1 Day Post-Op Procedure(s) (LRB): AORTIC VALVE REPLACEMENT (AVR) USING 27 MM INSPIRIS RESILIA  AORTIC VALVE (N/A) CORONARY ARTERY BYPASS GRAFTING (CABG) x ONE ON CARDIOPULMONARY BYPASS. LIMA TO LAD (N/A) TRANSESOPHAGEAL ECHOCARDIOGRAM (TEE) (N/A) APPLICATION OF CELL SAVER (N/A) Subjective: Sore but otherwise feels ok  Objective: Vital signs in last 24 hours: Temp:  [97.3 F (36.3 C)-99.9 F (37.7 C)] 99.5 F (37.5 C) (02/10 0700) Pulse Rate:  [80-87] 87 (02/10 0400) Cardiac Rhythm: A-V Sequential paced (02/10 0400) Resp:  [0-32] 22 (02/10 0700) BP: (102-138)/(70-92) 129/70 (02/10 0700) SpO2:  [93 %-100 %] 100 % (02/10 0700) Arterial Line BP: (87-150)/(55-82) 136/66 (02/10 0700) FiO2 (%):  [40 %-50 %] 40 % (02/09 1746) Weight:  [85.1 kg] 85.1 kg (02/10 0500)  Hemodynamic parameters for last 24 hours: PAP: (20-37)/(8-21) 32/12 CVP:  [6 mmHg-19 mmHg] 6 mmHg CO:  [3.4 L/min-6 L/min] 6 L/min CI:  [1.6 L/min/m2-2.9 L/min/m2] 2.9 L/min/m2  Intake/Output from previous day: 02/09 0701 - 02/10 0700 In: 5011.1 [P.O.:50; I.V.:2947.2; Blood:730; IV OEUMPNTIR:4431] Out: 5400 [Urine:2865; Blood:1123; Chest QQPY:195] Intake/Output this shift: No intake/output data recorded.  General appearance: alert and cooperative Neurologic: intact Heart: regular rate and rhythm Lungs: clear to auscultation bilaterally Extremities: edema mild Wound: dressing dry  Lab Results: Recent Labs    04/03/21 1803 04/03/21 1809 04/03/21 2021 04/04/21 0225  WBC 15.9*  --   --  13.3*  HGB 11.7*   < > 11.2* 10.6*  HCT 34.2*   < > 33.0* 31.9*  PLT 176  --   --  149*   < > = values in this interval not displayed.   BMET:  Recent Labs    04/03/21 1803 04/03/21 1809 04/03/21 2021 04/04/21 0225  NA 136   < > 139 135  K 4.5   < > 4.5 4.6  CL 107  --   --  105  CO2 22  --   --  22  GLUCOSE 127*  --   --  120*  BUN 18  --   --  17  CREATININE 1.04  --   --  0.94  CALCIUM 7.4*  --   --  7.4*   < >  = values in this interval not displayed.    PT/INR:  Recent Labs    04/03/21 1352  LABPROT 15.6*  INR 1.2   ABG    Component Value Date/Time   PHART 7.335 (L) 04/03/2021 2021   HCO3 23.3 04/03/2021 2021   TCO2 25 04/03/2021 2021   ACIDBASEDEF 3.0 (H) 04/03/2021 2021   O2SAT 63.0 04/04/2021 0214   CBG (last 3)  Recent Labs    04/04/21 0219 04/04/21 0409 04/04/21 0621  GLUCAP 116* 130* 110*   CXR: ok  ECG: sinus, no acute changes or heart block.  Assessment/Plan: S/P Procedure(s) (LRB): AORTIC VALVE REPLACEMENT (AVR) USING 27 MM INSPIRIS RESILIA  AORTIC VALVE (N/A) CORONARY ARTERY BYPASS GRAFTING (CABG) x ONE ON CARDIOPULMONARY BYPASS. LIMA TO LAD (N/A) TRANSESOPHAGEAL ECHOCARDIOGRAM (TEE) (N/A) APPLICATION OF CELL SAVER (N/A)  POD 1 Hemodynamically stable in sinus rhythm. Continue Lopressor and titrate up as needed.  S/p liver transplant: continue Cellcept and Prograf.  Volume excess: Wt is 17 lbs below recorded preop wt from yesterday (obviously something is not accurate). Start diuresis.  DM: glucose under control. Transition to Levemir and SSI.  DC swan, arterial line.   OOB to chair. Probably remove chest tubes later today.  IS.   LOS: 1 day  Gaye Pollack 04/04/2021

## 2021-04-05 ENCOUNTER — Inpatient Hospital Stay (HOSPITAL_COMMUNITY): Payer: Medicare Other

## 2021-04-05 LAB — BASIC METABOLIC PANEL
Anion gap: 9 (ref 5–15)
BUN: 16 mg/dL (ref 6–20)
CO2: 28 mmol/L (ref 22–32)
Calcium: 7.6 mg/dL — ABNORMAL LOW (ref 8.9–10.3)
Chloride: 97 mmol/L — ABNORMAL LOW (ref 98–111)
Creatinine, Ser: 1.12 mg/dL (ref 0.61–1.24)
GFR, Estimated: >60 mL/min (ref >60)
Glucose, Bld: 142 mg/dL — ABNORMAL HIGH (ref 70–99)
Potassium: 4.7 mmol/L (ref 3.5–5.1)
Sodium: 134 mmol/L — ABNORMAL LOW (ref 135–145)

## 2021-04-05 LAB — GLUCOSE, CAPILLARY
Glucose-Capillary: 187 mg/dL — ABNORMAL HIGH (ref 70–99)
Glucose-Capillary: 142 mg/dL — ABNORMAL HIGH (ref 70–99)
Glucose-Capillary: 137 mg/dL — ABNORMAL HIGH (ref 70–99)
Glucose-Capillary: 123 mg/dL — ABNORMAL HIGH (ref 70–99)
Glucose-Capillary: 140 mg/dL — ABNORMAL HIGH (ref 70–99)
Glucose-Capillary: 166 mg/dL — ABNORMAL HIGH (ref 70–99)

## 2021-04-05 LAB — CBC
HCT: 30.3 % — ABNORMAL LOW (ref 39.0–52.0)
Hemoglobin: 9.9 g/dL — ABNORMAL LOW (ref 13.0–17.0)
MCH: 29.9 pg (ref 26.0–34.0)
MCHC: 32.7 g/dL (ref 30.0–36.0)
MCV: 91.5 fL (ref 80.0–100.0)
Platelets: 132 10*3/uL — ABNORMAL LOW (ref 150–400)
RBC: 3.31 MIL/uL — ABNORMAL LOW (ref 4.22–5.81)
RDW: 13.2 % (ref 11.5–15.5)
WBC: 12.8 10*3/uL — ABNORMAL HIGH (ref 4.0–10.5)
nRBC: 0 % (ref 0.0–0.2)

## 2021-04-05 MED ORDER — METOPROLOL TARTRATE 12.5 MG HALF TABLET
12.5000 mg | ORAL_TABLET | Freq: Two times a day (BID) | ORAL | Status: DC
  Administered 2021-04-05: 12.5 mg via ORAL
  Filled 2021-04-05: qty 1

## 2021-04-05 MED ORDER — ~~LOC~~ CARDIAC SURGERY, PATIENT & FAMILY EDUCATION: Freq: Once | Status: AC

## 2021-04-05 MED ORDER — METOPROLOL TARTRATE 12.5 MG HALF TABLET: 12.5000 mg | ORAL_TABLET | Freq: Two times a day (BID) | ORAL | Status: DC

## 2021-04-05 MED ORDER — PANTOPRAZOLE SODIUM 40 MG PO TBEC
40.0000 mg | DELAYED_RELEASE_TABLET | Freq: Every day | ORAL | Status: DC
  Administered 2021-04-06 – 2021-04-07 (×2): 40 mg via ORAL
  Filled 2021-04-05 (×2): qty 1

## 2021-04-05 MED ORDER — VITAMIN D 25 MCG (1000 UNIT) PO TABS
2000.0000 [IU] | ORAL_TABLET | Freq: Every day | ORAL | Status: DC
  Administered 2021-04-05 – 2021-04-07 (×3): 2000 [IU] via ORAL
  Filled 2021-04-05 (×3): qty 2

## 2021-04-05 MED ORDER — ASPIRIN EC 325 MG PO TBEC
325.0000 mg | DELAYED_RELEASE_TABLET | Freq: Every day | ORAL | Status: DC
  Administered 2021-04-06 – 2021-04-07 (×2): 325 mg via ORAL
  Filled 2021-04-05 (×2): qty 1

## 2021-04-05 MED ORDER — BISACODYL 10 MG RE SUPP: 10.0000 mg | Freq: Every day | RECTAL | Status: DC | PRN

## 2021-04-05 MED ORDER — SODIUM CHLORIDE 0.9% FLUSH
3.0000 mL | Freq: Two times a day (BID) | INTRAVENOUS | Status: DC
  Administered 2021-04-05 – 2021-04-07 (×5): 3 mL via INTRAVENOUS

## 2021-04-05 MED ORDER — TRAMADOL HCL 50 MG PO TABS
50.0000 mg | ORAL_TABLET | ORAL | Status: DC | PRN
  Administered 2021-04-06: 50 mg via ORAL
  Filled 2021-04-05: qty 1

## 2021-04-05 MED ORDER — ONDANSETRON HCL 4 MG PO TABS: 4.0000 mg | ORAL_TABLET | Freq: Four times a day (QID) | ORAL | Status: DC | PRN

## 2021-04-05 MED ORDER — SODIUM CHLORIDE 0.9% FLUSH: 3.0000 mL | INTRAVENOUS | Status: DC | PRN

## 2021-04-05 MED ORDER — BISACODYL 5 MG PO TBEC: 10.0000 mg | DELAYED_RELEASE_TABLET | Freq: Every day | ORAL | Status: DC | PRN

## 2021-04-05 MED ORDER — DOCUSATE SODIUM 100 MG PO CAPS
200.0000 mg | ORAL_CAPSULE | Freq: Every day | ORAL | Status: DC
  Administered 2021-04-07: 200 mg via ORAL
  Filled 2021-04-05 (×2): qty 2

## 2021-04-05 MED ORDER — METOCLOPRAMIDE HCL 5 MG/ML IJ SOLN
10.0000 mg | Freq: Four times a day (QID) | INTRAMUSCULAR | Status: AC
  Administered 2021-04-05 – 2021-04-06 (×4): 10 mg via INTRAVENOUS
  Filled 2021-04-05 (×4): qty 2

## 2021-04-05 MED ORDER — SODIUM CHLORIDE 0.9 % IV SOLN: 250.0000 mL | INTRAVENOUS | Status: DC | PRN

## 2021-04-05 MED ORDER — PRAVASTATIN SODIUM 40 MG PO TABS
40.0000 mg | ORAL_TABLET | Freq: Every evening | ORAL | Status: DC
  Administered 2021-04-05 – 2021-04-06 (×2): 40 mg via ORAL
  Filled 2021-04-05 (×2): qty 1

## 2021-04-05 MED ORDER — OXYCODONE HCL 5 MG PO TABS: 5.0000 mg | ORAL_TABLET | ORAL | Status: DC | PRN

## 2021-04-05 MED ORDER — ONDANSETRON HCL 4 MG/2ML IJ SOLN
4.0000 mg | Freq: Four times a day (QID) | INTRAMUSCULAR | Status: DC | PRN
  Administered 2021-04-05 (×2): 4 mg via INTRAVENOUS
  Filled 2021-04-05 (×2): qty 2

## 2021-04-05 MED ORDER — INSULIN ASPART 100 UNIT/ML IJ SOLN
0.0000 [IU] | Freq: Three times a day (TID) | INTRAMUSCULAR | Status: DC
  Administered 2021-04-05 – 2021-04-07 (×5): 2 [IU] via SUBCUTANEOUS

## 2021-04-05 NOTE — Anesthesia Postprocedure Evaluation (Signed)
Anesthesia Post Note  Patient: [name]  Procedure(s) Performed: AORTIC VALVE REPLACEMENT (AVR) USING 27 MM INSPIRIS RESILIA  AORTIC VALVE (Chest) CORONARY ARTERY BYPASS GRAFTING (CABG) x ONE ON CARDIOPULMONARY BYPASS. LIMA TO LAD (Chest) TRANSESOPHAGEAL ECHOCARDIOGRAM (TEE) APPLICATION OF CELL SAVER     Patient location during evaluation: SICU Anesthesia Type: General Level of consciousness: sedated Pain management: pain level controlled Vital Signs Assessment: post-procedure vital signs reviewed and stable Respiratory status: patient remains intubated per anesthesia plan Cardiovascular status: stable Postop Assessment: no apparent nausea or vomiting Anesthetic complications: no   No notable events documented.  Last Vitals:  Vitals:   04/05/21 0600 04/05/21 0749  BP: 130/84   Pulse:    Resp: (!) 23   Temp:  36.8 C  SpO2: 97%     Last Pain:  Vitals:   04/05/21 0749  TempSrc: Oral  PainSc:                  Santa Lighter

## 2021-04-05 NOTE — Progress Notes (Signed)
Pt admitted from 2 heart. CCMD called CHG bath given , Pacing wires taped , midline incision with dressing clean dry and intact. Patient oriented to unit and room to include call light and phone.  All needs addressed.  Call light within reach.  Dyann Kief, RN     04/05/21 1140  Vitals  Temp 98.1 F (36.7 C)  Temp Source Oral  BP 138/77  MAP (mmHg) 95  BP Location Right Arm  BP Method Automatic  Patient Position (if appropriate) Sitting  Pulse Rate 83  Pulse Rate Source Monitor  ECG Heart Rate 84  Resp (!) 21  Level of Consciousness  Level of Consciousness Alert  MEWS COLOR  MEWS Score Color Green  Oxygen Therapy  SpO2 99 %  O2 Device Room Air  O2 Flow Rate (L/min) 0 L/min  Pain Assessment  Pain Scale 0-10  Pain Score 3  MEWS Score  MEWS Temp 0  MEWS Systolic 0  MEWS Pulse 0  MEWS RR 1  MEWS LOC 0  MEWS Score 1

## 2021-04-05 NOTE — Progress Notes (Signed)
Cardiac Rehab 1400 Order received and chart reviewed. Attempted to get pt to ambulate. He was in bed and c/o of feeling nauseated. He states that he had taken something for nausea. Pt states that he did not feel like walking and he would like to wait. I encouraged him to walk with staff two more times today and three times tomorrow. We will follow pt on Monday.

## 2021-04-05 NOTE — Progress Notes (Signed)
2 Days Post-Op Procedure(s) (LRB): AORTIC VALVE REPLACEMENT (AVR) USING 27 MM INSPIRIS RESILIA  AORTIC VALVE (N/A) CORONARY ARTERY BYPASS GRAFTING (CABG) x ONE ON CARDIOPULMONARY BYPASS. LIMA TO LAD (N/A) TRANSESOPHAGEAL ECHOCARDIOGRAM (TEE) (N/A) APPLICATION OF CELL SAVER (N/A) Subjective: Some nausea. Passing flatus but no BM yet  Objective: Vital signs in last 24 hours: Temp:  [98.2 F (36.8 C)-98.6 F (37 C)] 98.2 F (36.8 C) (02/11 0749) Pulse Rate:  [75-78] 78 (02/10 2106) Cardiac Rhythm: Normal sinus rhythm (02/11 0400) Resp:  [13-24] 23 (02/11 0600) BP: (107-143)/(57-107) 130/84 (02/11 0600) SpO2:  [75 %-99 %] 97 % (02/11 0600) Weight:  [92.3 kg] 92.3 kg (02/11 0550)  Hemodynamic parameters for last 24 hours:    Intake/Output from previous day: 02/10 0701 - 02/11 0700 In: 1084.7 [P.O.:500; I.V.:284.7; IV Piggyback:300] Out: 4260 [Urine:4105; Chest Tube:155] Intake/Output this shift: No intake/output data recorded.  General appearance: alert and cooperative Neurologic: intact Heart: regular rate and rhythm, S1, S2 normal, no murmur Lungs: clear to auscultation bilaterally Abdomen: soft, non-tender; bowel sounds normal Extremities: no edema Wound: dressing dry   Lab Results: Recent Labs    04/04/21 1700 04/05/21 0438  WBC 11.5* 12.8*  HGB 10.2* 9.9*  HCT 31.2* 30.3*  PLT 132* 132*   BMET:  Recent Labs    04/04/21 1700 04/05/21 0438  NA 135 134*  K 4.2 4.7  CL 101 97*  CO2 26 28  GLUCOSE 155* 142*  BUN 16 16  CREATININE 1.06 1.12  CALCIUM 7.4* 7.6*    PT/INR:  Recent Labs    04/03/21 1352  LABPROT 15.6*  INR 1.2   ABG    Component Value Date/Time   PHART 7.335 (L) 04/03/2021 2021   HCO3 23.3 04/03/2021 2021   TCO2 25 04/03/2021 2021   ACIDBASEDEF 3.0 (H) 04/03/2021 2021   O2SAT 63.0 04/04/2021 0214   CBG (last 3)  Recent Labs    04/04/21 2318 04/05/21 0438 04/05/21 0750  GLUCAP 179* 142* 137*   CXR:  ok  Assessment/Plan: S/P Procedure(s) (LRB): AORTIC VALVE REPLACEMENT (AVR) USING 27 MM INSPIRIS RESILIA  AORTIC VALVE (N/A) CORONARY ARTERY BYPASS GRAFTING (CABG) x ONE ON CARDIOPULMONARY BYPASS. LIMA TO LAD (N/A) TRANSESOPHAGEAL ECHOCARDIOGRAM (TEE) (N/A) APPLICATION OF CELL SAVER (N/A)  POD 2 Hemodynamically stable in sinus rhythm. Continue Lopressor.  Wt is at preop. -3100 cc yesterday. No further diuresis.  DM: glucose under good control on Levemir and SSI.  Nausea: add Reglan. Should improve if bowels get moving.  DC sleeve, foley  Transfer to 4E and continue IS, ambulation.   LOS: 2 days    Gaye Pollack 04/05/2021

## 2021-04-06 LAB — BASIC METABOLIC PANEL
Anion gap: 12 (ref 5–15)
BUN: 24 mg/dL — ABNORMAL HIGH (ref 6–20)
CO2: 23 mmol/L (ref 22–32)
Calcium: 8.1 mg/dL — ABNORMAL LOW (ref 8.9–10.3)
Chloride: 100 mmol/L (ref 98–111)
Creatinine, Ser: 1.15 mg/dL (ref 0.61–1.24)
GFR, Estimated: >60 mL/min (ref >60)
Glucose, Bld: 159 mg/dL — ABNORMAL HIGH (ref 70–99)
Potassium: 4.4 mmol/L (ref 3.5–5.1)
Sodium: 135 mmol/L (ref 135–145)

## 2021-04-06 LAB — CBC
HCT: 30.5 % — ABNORMAL LOW (ref 39.0–52.0)
Hemoglobin: 10 g/dL — ABNORMAL LOW (ref 13.0–17.0)
MCH: 30.4 pg (ref 26.0–34.0)
MCHC: 32.8 g/dL (ref 30.0–36.0)
MCV: 92.7 fL (ref 80.0–100.0)
Platelets: 189 10*3/uL (ref 150–400)
RBC: 3.29 MIL/uL — ABNORMAL LOW (ref 4.22–5.81)
RDW: 13.4 % (ref 11.5–15.5)
WBC: 14.6 10*3/uL — ABNORMAL HIGH (ref 4.0–10.5)
nRBC: 0 % (ref 0.0–0.2)

## 2021-04-06 LAB — GLUCOSE, CAPILLARY
Glucose-Capillary: 147 mg/dL — ABNORMAL HIGH (ref 70–99)
Glucose-Capillary: 159 mg/dL — ABNORMAL HIGH (ref 70–99)
Glucose-Capillary: 143 mg/dL — ABNORMAL HIGH (ref 70–99)
Glucose-Capillary: 122 mg/dL — ABNORMAL HIGH (ref 70–99)
Glucose-Capillary: 144 mg/dL — ABNORMAL HIGH (ref 70–99)

## 2021-04-06 MED ORDER — METOPROLOL TARTRATE 25 MG PO TABS
25.0000 mg | ORAL_TABLET | Freq: Two times a day (BID) | ORAL | Status: DC
  Administered 2021-04-06 – 2021-04-07 (×3): 25 mg via ORAL
  Filled 2021-04-06 (×3): qty 1

## 2021-04-06 NOTE — Progress Notes (Addendum)
° °   °  [address]       [place] [address]             [phone]      3 Days Post-Op Procedure(s) (LRB): AORTIC VALVE REPLACEMENT (AVR) USING 27 MM INSPIRIS RESILIA  AORTIC VALVE (N/A) CORONARY ARTERY BYPASS GRAFTING (CABG) x ONE ON CARDIOPULMONARY BYPASS. LIMA TO LAD (N/A) TRANSESOPHAGEAL ECHOCARDIOGRAM (TEE) (N/A) APPLICATION OF CELL SAVER (N/A) Subjective: Transferred to 4E yesterday.  Had nausea most of the day and didn't feel like walking.  Nausea improved but still no appetite. No abdominal pain. Had a BM last night.   Objective: Vital signs in last 24 hours: Temp:  [98.1 F (36.7 C)-98.6 F (37 C)] 98.6 F (37 C) (02/12 0747) Pulse Rate:  [79-93] 84 (02/12 0747) Cardiac Rhythm: Normal sinus rhythm (02/11 1902) Resp:  [17-21] 20 (02/12 0747) BP: (124-143)/(75-92) 138/75 (02/12 0747) SpO2:  [92 %-99 %] 97 % (02/12 0747) Weight:  [90.7 kg-92.3 kg] 90.7 kg (02/12 0300)     Intake/Output from previous day: 02/11 0701 - 02/12 0700 In: 120 [P.O.:120] Out: 790 [Urine:790] Intake/Output this shift: No intake/output data recorded.  General appearance: alert, cooperative, and mild distress Neurologic: intact Heart: Stable SR Lungs: breath sounds are clear. Abdomen: mild distension but he said this is normal for him. No tenderness. Extremities: no peripheral edema Wound: the sternal dressing is dry.  Lab Results: Recent Labs    04/05/21 0438 04/06/21 0649  WBC 12.8* 14.6*  HGB 9.9* 10.0*  HCT 30.3* 30.5*  PLT 132* 189   BMET:  Recent Labs    04/05/21 0438 04/06/21 0649  NA 134* 135  K 4.7 4.4  CL 97* 100  CO2 28 23  GLUCOSE 142* 159*  BUN 16 24*  CREATININE 1.12 1.15  CALCIUM 7.6* 8.1*    PT/INR:  Recent Labs    04/03/21 1352  LABPROT 15.6*  INR 1.2   ABG    Component Value Date/Time   PHART 7.335 (L) 04/03/2021 2021   HCO3 23.3 04/03/2021 2021   TCO2 25 04/03/2021 2021   ACIDBASEDEF 3.0 (H) 04/03/2021 2021   O2SAT  63.0 04/04/2021 0214   CBG (last 3)  Recent Labs    04/05/21 1601 04/05/21 2120 04/06/21 0605  GLUCAP 140* 166* 147*    Assessment/Plan: S/P Procedure(s) (LRB): AORTIC VALVE REPLACEMENT (AVR) USING 27 MM INSPIRIS RESILIA  AORTIC VALVE (N/A) CORONARY ARTERY BYPASS GRAFTING (CABG) x ONE ON CARDIOPULMONARY BYPASS. LIMA TO LAD (N/A) TRANSESOPHAGEAL ECHOCARDIOGRAM (TEE) (N/A) APPLICATION OF CELL SAVER (N/A)  -POD3 AVR and CABG x1. Stable VS and cardiac rhythm. On ASA, statin, low-dose metoprolol. Increasing metoprolol to 25mg  BID for BP 130's -140. Advance activity as tolerated. Plan to remove pacer wires tomorrow.   -ENDO- H/O DM. Glucose controlled with BID Levemir and SSI.  Synthroid resumed.   -GI-nausea and poor appetite-abd exam is benign. H/O liver transplant, Prograf and Cellcept have been resumed.   -HEME- mild expected acute blood loss anemia- Hct stable. Mild leukocytosis, no fever. Monitor.  -RENAL- normal function. Wt is below pre-op. No further diuretics for now.    LOS: 3 days    Malon Kindle [phone] 04/06/2021   Chart reviewed, patient examined, agree with above. He feels better and is ambulating well. Had normal BM today. Able to eat some broth and pudding. Continue mobilization, IS, Plan home Tuesday if he continues to progress.

## 2021-04-06 NOTE — Plan of Care (Signed)
°  Problem: Education: Goal: Knowledge of General Education information will improve Description: Including pain rating scale, medication(s)/side effects and non-pharmacologic comfort measures Outcome: Progressing   Problem: Health Behavior/Discharge Planning: Goal: Ability to manage health-related needs will improve Outcome: Progressing   Problem: Clinical Measurements: Goal: Ability to maintain clinical measurements within normal limits will improve Outcome: Progressing Goal: Will remain free from infection Outcome: Progressing Goal: Diagnostic test results will improve Outcome: Progressing Goal: Respiratory complications will improve Outcome: Progressing Goal: Cardiovascular complication will be avoided Outcome: Progressing   Problem: Activity: Goal: Risk for activity intolerance will decrease Outcome: Progressing   Problem: Nutrition: Goal: Adequate nutrition will be maintained Outcome: Progressing   Problem: Coping: Goal: Level of anxiety will decrease Outcome: Progressing   Problem: Pain Managment: Goal: General experience of comfort will improve Outcome: Progressing   Problem: Safety: Goal: Ability to remain free from injury will improve Outcome: Progressing   Problem: Skin Integrity: Goal: Risk for impaired skin integrity will decrease Outcome: Progressing   Problem: Education: Goal: Will demonstrate proper wound care and an understanding of methods to prevent future damage Outcome: Progressing Goal: Knowledge of disease or condition will improve Outcome: Progressing Goal: Knowledge of the prescribed therapeutic regimen will improve Outcome: Progressing Goal: Individualized Educational Video(s) Outcome: Progressing   Problem: Activity: Goal: Risk for activity intolerance will decrease Outcome: Progressing   Problem: Cardiac: Goal: Will achieve and/or maintain hemodynamic stability Outcome: Progressing   Problem: Clinical Measurements: Goal:  Postoperative complications will be avoided or minimized Outcome: Progressing   Problem: Respiratory: Goal: Respiratory status will improve Outcome: Progressing   Problem: Skin Integrity: Goal: Wound healing without signs and symptoms of infection Outcome: Progressing Goal: Risk for impaired skin integrity will decrease Outcome: Progressing   Problem: Urinary Elimination: Goal: Ability to achieve and maintain adequate renal perfusion and functioning will improve Outcome: Progressing   Problem: Education: Goal: Will demonstrate proper wound care and an understanding of methods to prevent future damage Outcome: Progressing Goal: Knowledge of disease or condition will improve Outcome: Progressing Goal: Knowledge of the prescribed therapeutic regimen will improve Outcome: Progressing Goal: Individualized Educational Video(s) Outcome: Progressing   Problem: Activity: Goal: Risk for activity intolerance will decrease Outcome: Progressing   Problem: Cardiac: Goal: Will achieve and/or maintain hemodynamic stability Outcome: Progressing   Problem: Clinical Measurements: Goal: Postoperative complications will be avoided or minimized Outcome: Progressing   Problem: Respiratory: Goal: Respiratory status will improve Outcome: Progressing   Problem: Skin Integrity: Goal: Wound healing without signs and symptoms of infection Outcome: Progressing Goal: Risk for impaired skin integrity will decrease Outcome: Progressing   Problem: Urinary Elimination: Goal: Ability to achieve and maintain adequate renal perfusion and functioning will improve Outcome: Progressing

## 2021-04-07 DIAGNOSIS — Z944 Liver transplant status: Principal | ICD-10-CM

## 2021-04-07 DIAGNOSIS — Z5181 Encounter for therapeutic drug level monitoring: Principal | ICD-10-CM

## 2021-04-07 DIAGNOSIS — E612 Magnesium deficiency: Principal | ICD-10-CM

## 2021-04-07 LAB — BASIC METABOLIC PANEL
Anion gap: 10 (ref 5–15)
BUN: 28 mg/dL — ABNORMAL HIGH (ref 6–20)
CO2: 23 mmol/L (ref 22–32)
Calcium: 8.1 mg/dL — ABNORMAL LOW (ref 8.9–10.3)
Chloride: 101 mmol/L (ref 98–111)
Creatinine, Ser: 1.03 mg/dL (ref 0.61–1.24)
GFR, Estimated: >60 mL/min (ref >60)
Glucose, Bld: 158 mg/dL — ABNORMAL HIGH (ref 70–99)
Potassium: 4.3 mmol/L (ref 3.5–5.1)
Sodium: 134 mmol/L — ABNORMAL LOW (ref 135–145)

## 2021-04-07 LAB — CBC
HCT: 27.6 % — ABNORMAL LOW (ref 39.0–52.0)
Hemoglobin: 8.8 g/dL — ABNORMAL LOW (ref 13.0–17.0)
MCH: 29.8 pg (ref 26.0–34.0)
MCHC: 31.9 g/dL (ref 30.0–36.0)
MCV: 93.6 fL (ref 80.0–100.0)
Platelets: 259 10*3/uL (ref 150–400)
RBC: 2.95 MIL/uL — ABNORMAL LOW (ref 4.22–5.81)
RDW: 13.3 % (ref 11.5–15.5)
WBC: 13.7 10*3/uL — ABNORMAL HIGH (ref 4.0–10.5)
nRBC: 0.1 % (ref 0.0–0.2)

## 2021-04-07 LAB — ECHO INTRAOPERATIVE TEE
AR max vel: 0.92 cm2
AV Area VTI: 0.87 cm2
AV Area mean vel: 0.78 cm2
AV Mean grad: 46 mmHg
AV Peak grad: 65.9 mmHg
Ao pk vel: 4.06 m/s
Height: 68 in
S' Lateral: 2.3 cm
Weight: 3264 oz

## 2021-04-07 LAB — GLUCOSE, CAPILLARY
Glucose-Capillary: 151 mg/dL — ABNORMAL HIGH (ref 70–99)
Glucose-Capillary: 137 mg/dL — ABNORMAL HIGH (ref 70–99)

## 2021-04-07 MED ORDER — LEVOTHYROXINE SODIUM 150 MCG PO TABS: 150.0000 ug | ORAL_TABLET | Freq: Every day | ORAL | Status: DC

## 2021-04-07 MED ORDER — OXYCODONE HCL 5 MG PO TABS: 5.0000 mg | ORAL_TABLET | Freq: Four times a day (QID) | ORAL | 0 refills | Status: AC | PRN

## 2021-04-07 MED ORDER — FE FUMARATE-B12-VIT C-FA-IFC PO CAPS
1.0000 | ORAL_CAPSULE | Freq: Two times a day (BID) | ORAL | Status: DC
  Administered 2021-04-07: 1 via ORAL
  Filled 2021-04-07: qty 1

## 2021-04-07 MED ORDER — FE FUMARATE-B12-VIT C-FA-IFC PO CAPS: 1.0000 | ORAL_CAPSULE | Freq: Two times a day (BID) | ORAL | 1 refills | Status: DC

## 2021-04-07 MED ORDER — METOPROLOL TARTRATE 25 MG PO TABS: 25.0000 mg | ORAL_TABLET | Freq: Two times a day (BID) | ORAL | 1 refills | Status: DC

## 2021-04-07 MED ORDER — ASPIRIN 325 MG PO TBEC: 325.0000 mg | DELAYED_RELEASE_TABLET | Freq: Every day | ORAL | Status: AC

## 2021-04-07 NOTE — Progress Notes (Signed)
CARDIAC REHAB PHASE I   PRE:  Rate/Rhythm: 75 SR  BP:  Sitting: 127/83      SaO2: 95 RA  MODE:  Ambulation: 200 ft   POST:  Rate/Rhythm: 83 SR  BP:  Sitting: 131/77    SaO2: 94 RA   Pt ambulated 21ft in hallway standby assist with front wheel walker. Pt states minor dizziness. Denies CP and SOB. Pt denies DME needs for home.   [id number] Rufina Falco, RN BSN 04/07/2021 11:06 AM

## 2021-04-07 NOTE — Progress Notes (Signed)
CARDIAC REHAB PHASE I   D/c education completed with pt and wife. Pt educated on importance of site care and monitoring incisions daily. Encouraged continued IS use, walks, and sternal precautions. Pt given in-the-tube sheet along with heart healthy and diabetic diets. Reviewed restrictions, and exercise guidelines. Will refer to CRP II [place].  [id number] Rufina Falco, RN BSN 04/07/2021 1:35 PM

## 2021-04-07 NOTE — TOC Transition Note (Signed)
Transition of Care (TOC) - CM/SW Discharge Note Marvetta Gibbons RN, BSN Transitions of Care Unit 4E- RN Case Manager See Treatment Team for direct phone #    Patient Details  Name: [name] MRN: [id number] Date of Birth: [birth year]  Transition of [facility] CM/SW Contact:  Dawayne Patricia, RN Phone Number: 04/07/2021, 12:47 PM   Clinical Narrative:    Pt stable for transition home today with spouse, Pt denies any DME needs for home, reports he has RW if needed.  Wife to transport home.   CM was notified by [facility] that they received preop referral for any HH needs- [facility] will follow up with pt with surgeon protocol for [facility] post discharge.    Final next level of care: [place] Barriers to Discharge: No Barriers Identified   Patient Goals and CMS Choice    HH protocol per Surgeon office    Discharge Placement                 Home w/ [facility]      Discharge Plan and Services                DME Arranged: N/A DME Agency: NA         HH Agency: [place] Date [place]: 04/07/21 Time HH Agency Contacted: 1000 Representative spoke with at [place]: [place] ([place]) Interventions     Readmission Risk Interventions Readmission Risk Prevention Plan 04/07/2021  Transportation Screening Complete  PCP or Specialist Appt within 5-7 Days Complete  Home Care Screening Complete  Medication Review (RN CM) Complete  Some recent data might be hidden

## 2021-04-07 NOTE — Care Management Important Message (Signed)
Important Message  Patient Details  Name: ESTEVON FLUKE MRN: [id number] Date of Birth: 06/08/1960   Medicare Important Message Given:  Yes     Shelda Altes 04/07/2021, 9:01 AM

## 2021-04-07 NOTE — Progress Notes (Signed)
EPW removed as per orders. Pt tolerated well. Bedrest until 0910.

## 2021-04-07 NOTE — Progress Notes (Signed)
[address]       [place] [address]             [phone]      4 Days Post-Op Procedure(s) (LRB): AORTIC VALVE REPLACEMENT (AVR) USING 27 MM INSPIRIS RESILIA  AORTIC VALVE (N/A) CORONARY ARTERY BYPASS GRAFTING (CABG) x ONE ON CARDIOPULMONARY BYPASS. LIMA TO LAD (N/A) TRANSESOPHAGEAL ECHOCARDIOGRAM (TEE) (N/A) APPLICATION OF CELL SAVER (N/A) Subjective: Conts to make good progress  Objective: Vital signs in last 24 hours: Temp:  [97.8 F (36.6 C)-99.8 F (37.7 C)] 98.4 F (36.9 C) (02/13 0454) Pulse Rate:  [76-89] 81 (02/13 0454) Cardiac Rhythm: Normal sinus rhythm (02/12 1900) Resp:  [14-20] 20 (02/13 0454) BP: (118-138)/(74-89) 132/80 (02/13 0454) SpO2:  [92 %-98 %] 92 % (02/13 0454) Weight:  [89.4 kg] 89.4 kg (02/13 0458)  Hemodynamic parameters for last 24 hours:    Intake/Output from previous day: 02/12 0701 - 02/13 0700 In: 100 [P.O.:100] Out: 300 [Urine:300] Intake/Output this shift: No intake/output data recorded.  General appearance: alert, cooperative, and no distress Heart: regular rate and rhythm Lungs: clear to auscultation bilaterally Abdomen: benign Extremities: no edema Wound: incis healing well  Lab Results: Recent Labs    04/06/21 0649 04/07/21 0124  WBC 14.6* 13.7*  HGB 10.0* 8.8*  HCT 30.5* 27.6*  PLT 189 259   BMET:  Recent Labs    04/06/21 0649 04/07/21 0124  NA 135 134*  K 4.4 4.3  CL 100 101  CO2 23 23  GLUCOSE 159* 158*  BUN 24* 28*  CREATININE 1.15 1.03  CALCIUM 8.1* 8.1*    PT/INR: No results for input(s): LABPROT, INR in the last 72 hours. ABG    Component Value Date/Time   PHART 7.335 (L) 04/03/2021 2021   HCO3 23.3 04/03/2021 2021   TCO2 25 04/03/2021 2021   ACIDBASEDEF 3.0 (H) 04/03/2021 2021   O2SAT 63.0 04/04/2021 0214   CBG (last 3)  Recent Labs    04/06/21 1554 04/06/21 2036 04/07/21 0622  GLUCAP 122* 144* 151*    Meds Scheduled Meds:  aspirin EC  325 mg Oral  Daily   cholecalciferol  2,000 Units Oral Daily   docusate sodium  200 mg Oral Daily   enoxaparin (LOVENOX) injection  40 mg Subcutaneous QHS   insulin aspart  0-24 Units Subcutaneous TID WC   insulin detemir  30 Units Subcutaneous Daily   levothyroxine  150 mcg Oral QAC breakfast   mouth rinse  15 mL Mouth Rinse BID   metoprolol tartrate  25 mg Oral BID   mycophenolate  250 mg Oral BID   pantoprazole  40 mg Oral QAC breakfast   pravastatin  40 mg Oral QPM   sertraline  50 mg Oral Daily   sodium chloride flush  3 mL Intravenous Q12H   tacrolimus  2 mg Oral BID   Continuous Infusions:  sodium chloride     PRN Meds:.sodium chloride, bisacodyl **OR** bisacodyl, ondansetron **OR** ondansetron (ZOFRAN) IV, oxyCODONE, sodium chloride flush, traMADol  Xrays No results found.  Assessment/Plan: S/P Procedure(s) (LRB): AORTIC VALVE REPLACEMENT (AVR) USING 27 MM INSPIRIS RESILIA  AORTIC VALVE (N/A) CORONARY ARTERY BYPASS GRAFTING (CABG) x ONE ON CARDIOPULMONARY BYPASS. LIMA TO LAD (N/A) TRANSESOPHAGEAL ECHOCARDIOGRAM (TEE) (N/A) APPLICATION OF CELL SAVER (N/A) POD#4  1 Tmax 99.8, VSS, sinus rhythm 2 sats good on RA, , CPAP at night 3 weight below preop, voiding, normal renal fxn 4 leukocytosis trending down 5  H/H down a little further, not in transfusion threshold, will add trinsicon 6 BS ok control, home meds at D/C, primary management outpatient 7 looks good, will d/c pacer wires this am and poss d/c later today v tomorrow, he didn't ambulate yesterday    LOS: 4 days    John Giovanni PA-C Pager [phone] 04/07/2021

## 2021-04-07 NOTE — Progress Notes (Signed)
Discharge orders received.  IV and telemetry removed.  Discharge education reviewed, pt and s.o. express understanding.  Pt discharged to home in care of daughter and s.o.

## 2021-04-08 MED FILL — Lidocaine HCl Local Preservative Free (PF) Inj 2%: INTRAMUSCULAR | Qty: 15 | Status: AC

## 2021-04-08 MED FILL — Potassium Chloride Inj 2 mEq/ML: INTRAVENOUS | Qty: 40 | Status: AC

## 2021-04-08 MED FILL — Heparin Sodium (Porcine) Inj 1000 Unit/ML: Qty: 1000 | Status: AC

## 2021-04-09 DIAGNOSIS — G4733 Obstructive sleep apnea (adult) (pediatric): Secondary | ICD-10-CM | POA: Diagnosis not present

## 2021-04-09 MED FILL — Sodium Chloride IV Soln 0.9%: INTRAVENOUS | Qty: 1000 | Status: AC

## 2021-04-09 MED FILL — Heparin Sodium (Porcine) Inj 1000 Unit/ML: INTRAMUSCULAR | Qty: 20 | Status: AC

## 2021-04-09 MED FILL — Electrolyte-R (PH 7.4) Solution: INTRAVENOUS | Qty: 3000 | Status: AC

## 2021-04-09 MED FILL — Sodium Bicarbonate IV Soln 8.4%: INTRAVENOUS | Qty: 100 | Status: AC

## 2021-04-09 MED FILL — Mannitol IV Soln 20%: INTRAVENOUS | Qty: 500 | Status: AC

## 2021-04-10 ENCOUNTER — Other Ambulatory Visit: Payer: Self-pay

## 2021-04-10 ENCOUNTER — Ambulatory Visit (INDEPENDENT_AMBULATORY_CARE_PROVIDER_SITE_OTHER): Payer: Self-pay

## 2021-04-10 DIAGNOSIS — Z4802 Encounter for removal of sutures: Secondary | ICD-10-CM

## 2021-04-10 NOTE — Unmapped (Signed)
[facility] Refill Coordination Note    Specialty Medication(s) to be Shipped:   Transplant: tacrolimus 1mg     Other medication(s) to be shipped: No additional medications requested for fill at this time     [name], DOB: 1960-08-12  Phone: [phone] (home)       All above HIPAA information was verified with patient.     Was a Nurse, learning disability used for this call? No    Completed refill call assessment today to schedule patient's medication shipment from the [facility] [phone]).  All relevant notes have been reviewed.     Specialty medication(s) and dose(s) confirmed: Regimen is correct and unchanged.   Changes to medications: [name] reports no changes at this time.  Changes to insurance: No  New side effects reported not previously addressed with a pharmacist or physician: None reported  Questions for the pharmacist: No    Confirmed patient received a Conservation officer, historic buildings and a Surveyor, mining with first shipment. The patient will receive a drug information handout for each medication shipped and additional FDA Medication Guides as required.       DISEASE/MEDICATION-SPECIFIC INFORMATION        N/A    SPECIALTY MEDICATION ADHERENCE     Medication Adherence    Patient reported X missed doses in the last month: 0  Specialty Medication: Tacrolimus 1mg   Patient is on additional specialty medications: No        Were doses missed due to medication being on hold? No    Tacrolimus 1 mg: 10 days of medicine on hand     REFERRAL TO PHARMACIST     Referral to the pharmacist: Not needed      [facility]     Shipping address confirmed in Epic.     Delivery Scheduled: Yes, Expected medication delivery date: 04/18/2021.     Medication will be delivered via UPS to the prescription address in Epic WAM.    Lorelei Pont [facility] Specialty Technician

## 2021-04-10 NOTE — Progress Notes (Signed)
Patient arrived for nurse visit to remove sutures post- procedure CABG/ AVR with Dr. Cyndia Bent 04/03/21.  3 Sutures removed from upper abdomen with no signs/ symptoms of infection noted.  Patient tolerated procedure well.  Patient/ family instructed to keep the incision sites clean and dry.  Patient/ family acknowledged instructions given.

## 2021-04-11 ENCOUNTER — Encounter: Payer: Self-pay | Admitting: Family Medicine

## 2021-04-11 DIAGNOSIS — Z944 Liver transplant status: Secondary | ICD-10-CM | POA: Diagnosis not present

## 2021-04-11 DIAGNOSIS — Z952 Presence of prosthetic heart valve: Secondary | ICD-10-CM | POA: Diagnosis not present

## 2021-04-11 DIAGNOSIS — R7401 Elevation of levels of liver transaminase levels: Secondary | ICD-10-CM | POA: Diagnosis not present

## 2021-04-11 DIAGNOSIS — G4733 Obstructive sleep apnea (adult) (pediatric): Secondary | ICD-10-CM | POA: Diagnosis not present

## 2021-04-11 DIAGNOSIS — Z7984 Long term (current) use of oral hypoglycemic drugs: Secondary | ICD-10-CM | POA: Diagnosis not present

## 2021-04-11 DIAGNOSIS — I2510 Atherosclerotic heart disease of native coronary artery without angina pectoris: Secondary | ICD-10-CM | POA: Diagnosis not present

## 2021-04-11 DIAGNOSIS — E119 Type 2 diabetes mellitus without complications: Secondary | ICD-10-CM | POA: Diagnosis not present

## 2021-04-11 DIAGNOSIS — Z79620 Long term (current) use of immunosuppressive biologic: Secondary | ICD-10-CM | POA: Diagnosis not present

## 2021-04-11 DIAGNOSIS — Z48812 Encounter for surgical aftercare following surgery on the circulatory system: Secondary | ICD-10-CM | POA: Diagnosis not present

## 2021-04-11 DIAGNOSIS — M6281 Muscle weakness (generalized): Secondary | ICD-10-CM | POA: Diagnosis not present

## 2021-04-12 DIAGNOSIS — G4733 Obstructive sleep apnea (adult) (pediatric): Secondary | ICD-10-CM | POA: Diagnosis not present

## 2021-04-12 DIAGNOSIS — Z48812 Encounter for surgical aftercare following surgery on the circulatory system: Secondary | ICD-10-CM | POA: Diagnosis not present

## 2021-04-12 DIAGNOSIS — I2510 Atherosclerotic heart disease of native coronary artery without angina pectoris: Secondary | ICD-10-CM | POA: Diagnosis not present

## 2021-04-12 DIAGNOSIS — E119 Type 2 diabetes mellitus without complications: Secondary | ICD-10-CM | POA: Diagnosis not present

## 2021-04-12 DIAGNOSIS — Z952 Presence of prosthetic heart valve: Secondary | ICD-10-CM | POA: Diagnosis not present

## 2021-04-12 DIAGNOSIS — Z79620 Long term (current) use of immunosuppressive biologic: Secondary | ICD-10-CM | POA: Diagnosis not present

## 2021-04-12 DIAGNOSIS — M6281 Muscle weakness (generalized): Secondary | ICD-10-CM | POA: Diagnosis not present

## 2021-04-12 DIAGNOSIS — Z944 Liver transplant status: Secondary | ICD-10-CM | POA: Diagnosis not present

## 2021-04-12 DIAGNOSIS — Z7984 Long term (current) use of oral hypoglycemic drugs: Secondary | ICD-10-CM | POA: Diagnosis not present

## 2021-04-12 DIAGNOSIS — R7401 Elevation of levels of liver transaminase levels: Secondary | ICD-10-CM | POA: Diagnosis not present

## 2021-04-13 ENCOUNTER — Other Ambulatory Visit: Payer: Self-pay | Admitting: Family Medicine

## 2021-04-13 MED ORDER — ALENDRONATE SODIUM 70 MG PO TABS: 70.0000 mg | ORAL_TABLET | ORAL | 3 refills | Status: DC

## 2021-04-13 MED ORDER — ALENDRONATE 70 MG TABLET
ORAL_TABLET | ORAL | 3 refills | 84.00000 days
Start: 2021-04-13 — End: ?

## 2021-04-14 DIAGNOSIS — Z944 Liver transplant status: Principal | ICD-10-CM

## 2021-04-14 DIAGNOSIS — E612 Magnesium deficiency: Principal | ICD-10-CM

## 2021-04-14 DIAGNOSIS — Z5181 Encounter for therapeutic drug level monitoring: Principal | ICD-10-CM

## 2021-04-14 DIAGNOSIS — G4733 Obstructive sleep apnea (adult) (pediatric): Secondary | ICD-10-CM | POA: Diagnosis not present

## 2021-04-14 DIAGNOSIS — I2510 Atherosclerotic heart disease of native coronary artery without angina pectoris: Secondary | ICD-10-CM | POA: Diagnosis not present

## 2021-04-14 DIAGNOSIS — R7401 Elevation of levels of liver transaminase levels: Secondary | ICD-10-CM | POA: Diagnosis not present

## 2021-04-14 DIAGNOSIS — Z79620 Long term (current) use of immunosuppressive biologic: Secondary | ICD-10-CM | POA: Diagnosis not present

## 2021-04-14 DIAGNOSIS — Z7984 Long term (current) use of oral hypoglycemic drugs: Secondary | ICD-10-CM | POA: Diagnosis not present

## 2021-04-14 DIAGNOSIS — Z952 Presence of prosthetic heart valve: Secondary | ICD-10-CM | POA: Diagnosis not present

## 2021-04-14 DIAGNOSIS — Z48812 Encounter for surgical aftercare following surgery on the circulatory system: Secondary | ICD-10-CM | POA: Diagnosis not present

## 2021-04-14 DIAGNOSIS — E119 Type 2 diabetes mellitus without complications: Secondary | ICD-10-CM | POA: Diagnosis not present

## 2021-04-14 DIAGNOSIS — M6281 Muscle weakness (generalized): Secondary | ICD-10-CM | POA: Diagnosis not present

## 2021-04-15 DIAGNOSIS — Z952 Presence of prosthetic heart valve: Secondary | ICD-10-CM | POA: Diagnosis not present

## 2021-04-15 DIAGNOSIS — M6281 Muscle weakness (generalized): Secondary | ICD-10-CM | POA: Diagnosis not present

## 2021-04-15 DIAGNOSIS — Z944 Liver transplant status: Secondary | ICD-10-CM | POA: Diagnosis not present

## 2021-04-15 DIAGNOSIS — Z48812 Encounter for surgical aftercare following surgery on the circulatory system: Secondary | ICD-10-CM | POA: Diagnosis not present

## 2021-04-15 DIAGNOSIS — R7401 Elevation of levels of liver transaminase levels: Secondary | ICD-10-CM | POA: Diagnosis not present

## 2021-04-15 DIAGNOSIS — Z79620 Long term (current) use of immunosuppressive biologic: Secondary | ICD-10-CM | POA: Diagnosis not present

## 2021-04-15 DIAGNOSIS — E119 Type 2 diabetes mellitus without complications: Secondary | ICD-10-CM | POA: Diagnosis not present

## 2021-04-15 DIAGNOSIS — Z7984 Long term (current) use of oral hypoglycemic drugs: Secondary | ICD-10-CM | POA: Diagnosis not present

## 2021-04-15 DIAGNOSIS — I2510 Atherosclerotic heart disease of native coronary artery without angina pectoris: Secondary | ICD-10-CM | POA: Diagnosis not present

## 2021-04-15 DIAGNOSIS — G4733 Obstructive sleep apnea (adult) (pediatric): Secondary | ICD-10-CM | POA: Diagnosis not present

## 2021-04-16 ENCOUNTER — Telehealth (HOSPITAL_COMMUNITY): Payer: Self-pay

## 2021-04-16 DIAGNOSIS — R7401 Elevation of levels of liver transaminase levels: Secondary | ICD-10-CM | POA: Diagnosis not present

## 2021-04-16 DIAGNOSIS — M6281 Muscle weakness (generalized): Secondary | ICD-10-CM | POA: Diagnosis not present

## 2021-04-16 DIAGNOSIS — Z7984 Long term (current) use of oral hypoglycemic drugs: Secondary | ICD-10-CM | POA: Diagnosis not present

## 2021-04-16 DIAGNOSIS — Z48812 Encounter for surgical aftercare following surgery on the circulatory system: Secondary | ICD-10-CM | POA: Diagnosis not present

## 2021-04-16 DIAGNOSIS — E119 Type 2 diabetes mellitus without complications: Secondary | ICD-10-CM | POA: Diagnosis not present

## 2021-04-16 DIAGNOSIS — Z952 Presence of prosthetic heart valve: Secondary | ICD-10-CM | POA: Diagnosis not present

## 2021-04-16 DIAGNOSIS — I2510 Atherosclerotic heart disease of native coronary artery without angina pectoris: Secondary | ICD-10-CM | POA: Diagnosis not present

## 2021-04-16 DIAGNOSIS — G4733 Obstructive sleep apnea (adult) (pediatric): Secondary | ICD-10-CM | POA: Diagnosis not present

## 2021-04-16 DIAGNOSIS — Z79620 Long term (current) use of immunosuppressive biologic: Secondary | ICD-10-CM | POA: Diagnosis not present

## 2021-04-16 DIAGNOSIS — Z944 Liver transplant status: Secondary | ICD-10-CM | POA: Diagnosis not present

## 2021-04-16 NOTE — Telephone Encounter (Signed)
Pt insurance is active and benefits verified through [facility] Medicare. Co-pay $0.00, DED $0.00/$0.00 met, out of pocket $4,500.00/$137.61 met, co-insurance 0%. No pre-authorization required. Passport, 04/16/21 @ 2:52PM, JOI#[id number]   Will contact patient to see if he is interested in the Cardiac Rehab Program. If interested, patient will need to complete follow up appt. Once completed, patient will be contacted for scheduling upon review by the RN Navigator.

## 2021-04-16 NOTE — Progress Notes (Signed)
Cardiology Office Note    Date:  04/23/2021   ID:  [name], DOB 10-May-1960, MRN [id number]   PCP:  [name], MD   [place]  Cardiologist:  Dorris Carnes, MD   Advanced Practice Provider:  No care team member to display Electrophysiologist:  None   [id number]}   No chief complaint on file.   History of Present Illness:  [name] is a 61 y.o. male  with history of diabetes, hypertension, cirrhosis of the liver status post liver transplant on 09/16/2018 at [facility], OSA on CPAP, coronary artery disease(mild on cath prior to transplant), and bicuspid aortic valve disease with severe aortic stenosis.  He was being worked up for consideration of TAVR and CTA of the chest showed multiple nonobstructive pulmonary emboli bilaterally.  He therefore underwent a dedicated PE protocol CT which showed no central pulmonary emboli.  He was started on Eliquis with a plan for 3 months of anticoagulation.a right and left heart catheterization with coronary angiography which revealed a significant mid LAD lesion of 80%.    Patient underwent CABG x 1 and  AVR Inspiris Resilia  pericardial AV 04/03/21 by Dr. Cyndia Bent.   He comes in for post hospital f/u. Walking 1/4 mile/day.Some DOE or movement it takes to lay down but can lay flat without a problem. Labs 04/18/21 Hgb up to 9.8(was 8.8) K 5.4, Crt 1.07.   Past Medical History:  Diagnosis Date   Allergy    Asthma    Bicuspid aortic valve    sees dr Harrington Challenger   Cirrhosis [facility])    Diabetes mellitus without complication ([place])    DM2 (diabetes mellitus, type 2) ([place])    Fatty liver    with h/o elevated LFT's   GERD (gastroesophageal reflux disease)    Heart murmur    Hypertension    Itching    all over last few months   Jaundice    Liver transplant recipient ([place])    09/16/2018 at [facility]   Migraine with aura    OSA (obstructive sleep apnea) 09/14/2011   PSG 11/08/11>>AHI 31.6, SpO2 low 85%. wears CPAP, pt does not know  settings   Thyroid disease     Past Surgical History:  Procedure Laterality Date   AORTIC VALVE REPLACEMENT N/A 04/03/2021   Procedure: AORTIC VALVE REPLACEMENT (AVR) USING 27 MM INSPIRIS RESILIA  AORTIC VALVE;  Surgeon: Gaye Pollack, MD;  Location: [place];  Service: Open Heart Surgery;  Laterality: N/A;   APPENDECTOMY  11/2007   Emergency   BIOPSY THYROID  08/19/2007   Attempted, no tissue obtained   CARDIAC CATHETERIZATION  03/21/2018   CARDIOVASCULAR STRESS TEST  04/2005   Negative 06/05   CHOLECYSTECTOMY     CORONARY ARTERY BYPASS GRAFT N/A 04/03/2021   Procedure: CORONARY ARTERY BYPASS GRAFTING (CABG) x ONE ON CARDIOPULMONARY BYPASS. LIMA TO LAD;  Surgeon: Gaye Pollack, MD;  Location: [facility] OR;  Service: Open Heart Surgery;  Laterality: N/A;   DOPPLER ECHOCARDIOGRAPHY  07/2002   ESOPHAGEAL BANDING N/A 05/04/2016   Procedure: ESOPHAGEAL BANDING;  Surgeon: [name], MD;  Location: [facility];  Service: Endoscopy;  Laterality: N/A;   ESOPHAGOGASTRODUODENOSCOPY (EGD) WITH PROPOFOL N/A 05/04/2016   Procedure: ESOPHAGOGASTRODUODENOSCOPY (EGD) WITH PROPOFOL;  Surgeon: [name], MD;  Location: [facility];  Service: Endoscopy;  Laterality: N/A;   LIVER TRANSPLANT     09/16/2018 at [facility]   RIGHT/LEFT HEART CATH AND CORONARY ANGIOGRAPHY N/A  12/20/2020   Procedure: RIGHT/LEFT HEART CATH AND CORONARY ANGIOGRAPHY;  Surgeon: [name], MD;  Location: [place] CV LAB;  Service: Cardiovascular;  Laterality: N/A;   TEE WITHOUT CARDIOVERSION N/A 04/03/2021   Procedure: TRANSESOPHAGEAL ECHOCARDIOGRAM (TEE);  Surgeon: Gaye Pollack, MD;  Location: [place];  Service: Open Heart Surgery;  Laterality: N/A;    Current Medications: Current Meds  Medication Sig   acetaminophen (TYLENOL) 500 MG tablet Take 500 mg by mouth every 6 (six) hours as needed for moderate pain or headache.   albuterol (VENTOLIN HFA) 108 (90 Base) MCG/ACT inhaler Inhale 2 puffs into the lungs every 6  (six) hours as needed for wheezing or shortness of breath.   alendronate (FOSAMAX) 70 MG tablet Take 1 tablet (70 mg total) by mouth every Sunday. Take with a full glass of water on an empty stomach.   aspirin EC 325 MG EC tablet Take 1 tablet (325 mg total) by mouth daily.   azelastine (ASTELIN) 0.1 % nasal spray PLACE 2 SPRAYS INTO BOTH NOSTRILS TWICE DAILY AS NEEDED   calcium carbonate (TUMS - DOSED IN MG ELEMENTAL CALCIUM) 500 MG chewable tablet Chew 1 tablet by mouth daily.   Cholecalciferol (VITAMIN D) 50 MCG (2000 UT) tablet Take 2,000 Units by mouth daily.   empagliflozin (JARDIANCE) 10 MG TABS tablet Take 1 tablet (10 mg total) by mouth daily before breakfast.   ferrous GLOVFIEP-P29-JJOACZY C-folic acid (TRINSICON / FOLTRIN) capsule Take 1 capsule by mouth 2 (two) times daily after a meal.   fluticasone (FLONASE) 50 MCG/ACT nasal spray PLACE ONE OR TWO SPRAYS INTO BOTH NOSTRILS DAILY AS NEEDED.   levothyroxine (SYNTHROID) 150 MCG tablet Take 1 tablet (150 mcg total) by mouth daily before breakfast.   metFORMIN (GLUCOPHAGE-XR) 500 MG 24 hr tablet Take 2 tablets (1,000 mg total) by mouth in the morning and at bedtime.   metoprolol tartrate (LOPRESSOR) 25 MG tablet Take 1 tablet (25 mg total) by mouth 2 (two) times daily.   metroNIDAZOLE (METROGEL) 1 % gel Apply 1 application topically daily as needed (rosacea).   mycophenolate (CELLCEPT) 250 MG capsule Take 250 mg by mouth 2 (two) times daily.   OneTouch Delica Lancets 60Y MISC USE TO CHECK SUGAR DAILY   ONETOUCH ULTRA test strip USE TO CHECK SUGAR DAILY   pravastatin (PRAVACHOL) 40 MG tablet Take 40 mg by mouth every evening.   sertraline (ZOLOFT) 50 MG tablet Take 1 tablet (50 mg total) by mouth daily.   tacrolimus (PROGRAF) 1 MG capsule Take 2 mg by mouth 2 (two) times daily.     Allergies:   Watermelon flavor   Social History   Socioeconomic History   Marital status: Legally Separated    Spouse name: Not on file   Number of  children: 1   Years of education: Not on file   Highest education level: Not on file  Occupational History   Occupation: Sports coach: [employer]    Comment: Manages "front" and writes orders --in [place]   Occupation: Disabled  Tobacco Use   Smoking status: Never    Passive exposure: Past   Smokeless tobacco: Never  Vaping Use   Vaping Use: Never used  Substance and Sexual Activity   Alcohol use: No   Drug use: No   Sexual activity: Not on file  Other Topics Concern   Not on file  Social History Narrative   Married, 1989   1 child and 2 stepchildren  Prev ran a car shop   Social Determinants of Radio broadcast assistant Strain: Not on file  Food Insecurity: Not on file  Transportation Needs: Not on file  Physical Activity: Not on file  Stress: Not on file  Social Connections: Not on file     Family History:  The patient's  family history includes Asthma in his mother; COPD in his father; Cancer in his father; Colon cancer in his maternal grandmother; Heart disease in his maternal grandfather and sister; Liver disease in his sister; Prostate cancer in his maternal uncle.   ROS:   Please see the history of present illness.    ROS All other systems reviewed and are negative.   PHYSICAL EXAM:   VS:  BP 136/70    Pulse 77    Ht 5' 8.5" (1.74 m)    Wt 200 lb 3.2 oz (90.8 kg)    SpO2 95%    BMI 30.00 kg/m   Physical Exam  GEN: Well nourished, well developed, in no acute distress  Neck: no JVD, carotid bruits, or masses Cardiac:incisions healing well, RRR; no murmurs, rubs, or gallops  Respiratory:  decreased breath sounds at bases but cleared with cough, otherwise clear to auscultation bilaterally, normal work of breathing GI: soft, nontender, nondistended, + BS Ext: without cyanosis, clubbing, or edema, Good distal pulses bilaterally Neuro:  Alert and Oriented x 3, Psych: euthymic mood, full affect  Wt Readings from Last 3 Encounters:   04/23/21 200 lb 3.2 oz (90.8 kg)  04/07/21 197 lb 3.2 oz (89.4 kg)  04/01/21 204 lb 8 oz (92.8 kg)      Studies/Labs Reviewed:   EKG:  EKG is not ordered today.     Recent Labs: 11/18/2020: TSH 2.07 04/01/2021: ALT 75 04/04/2021: Magnesium 2.2 04/07/2021: BUN 28; Creatinine, Ser 1.03; Hemoglobin 8.8; Platelets 259; Potassium 4.3; Sodium 134   Lipid Panel    Component Value Date/Time   CHOL 108 11/18/2020 0958   CHOL 145 05/20/2020 0921   TRIG 397.0 (H) 11/18/2020 0958   HDL 33.50 (L) 11/18/2020 0958   HDL 32 (L) 05/20/2020 0921   CHOLHDL 3 11/18/2020 0958   VLDL 79.4 (H) 11/18/2020 0958   LDLCALC 35 05/20/2020 0921   LDLDIRECT 35.0 11/18/2020 0958    Additional studies/ records that were reviewed today include:   CT coronary 01/01/21:    IMPRESSION: 1.  Sievers type 1 bicuspid AV with calcium score 4920   2. Annular area 895 mm2 too large for currently available TAVR valves   3.  Dilated aortic root 4.0 cm   4.  Coronary arteries sufficient height above annulus for deployment   5. Optimum angiographic angle for deployment LAO 5 Cranial 5 degrees   CT chest 01/01/21:   IMPRESSION: 1. Vascular findings and measurements pertinent to potential TAVR procedure, as detailed above. 2. Severe thickening calcification of the aortic valve, compatible with reported clinical history of severe aortic stenosis. 3. Multiple nonobstructive pulmonary emboli in the pulmonary arterial tree bilaterally, as above. Additional emboli are suspected, but the quality of pulmonary artery opacification on today's examination which was arterially timed is sub optimal. Further evaluation with dedicated PE protocol CT scan is recommended at this time. 4. Hepatic cirrhosis with severe hepatic steatosis. Notably, there is a hypervascular area in segment 5 of the liver. This may simply represent an area of focal fatty sparing, however, an aggressive lesion such as hepatocellular carcinoma is not  excluded. Correlation with AFP levels and  follow-up abdominal MRI with and without IV gadolinium is recommended in the near future to better evaluate this finding and exclude neoplasm. 5. Aortic atherosclerosis, in addition to 2 vessel coronary artery disease. There is also ectasia of the ascending thoracic aorta (4.1 cm in diameter). Recommend annual imaging followup by CTA or MRA. This recommendation follows 2010 ACCF/AHA/AATS/ACR/ASA/SCA/SCAI/SIR/STS/SVM Guidelines for the Diagnosis and Management of Patients with Thoracic Aortic Disease. Circulation. 2010; 121: K160-F093. Aortic aneurysm NOS (ICD10-I71.9). 6. Additional incidental findings, as above.   Echo 11/25/20:  1. Left ventricular ejection fraction, by estimation, is 60 to 65%. The  left ventricle has normal function. The left ventricle has no regional  wall motion abnormalities. There is moderate left ventricular hypertrophy.  Left ventricular diastolic  parameters were normal.   2. Right ventricular systolic function is normal. The right ventricular  size is normal.   3. Left atrial size was mildly dilated.   4. The mitral valve is abnormal. Trivial mitral valve regurgitation. No  evidence of mitral stenosis. Moderate mitral annular calcification.   5. Gradients lower than TTE done 06/18/20 DVI lower prevoiusly 0.31 AVA  similar Fused right and left cusps. The aortic valve is calcified. There  is severe calcifcation of the aortic valve. There is severe thickening of  the aortic valve. Aortic valve  regurgitation is not visualized. Moderate to severe aortic valve stenosis.   6. There is moderate dilatation of the aortic root, measuring 42 mm.   7. The inferior vena cava is normal in size with greater than 50%  respiratory variability, suggesting right atrial pressure of 3 mmHg.     12/20/20 cath   Prox RCA lesion is 20% stenosed.   Mid RCA to Dist RCA lesion is 20% stenosed.   RPDA lesion is 20% stenosed.   Mid LAD  lesion is 80% stenosed.   Severe mid LAD stenosis Mild non-obstructive disease in the RCA and Circumflex Severe aortic stenosis (mean gradient 50.6 mmHg, peak to peak gradient 66 mmHg, AVA 0.78 cm2).    Recommendations: He has a severe mid LAD stenosis (not critical) and severe AS with bicuspid aortic valve. Ideally he would be treated with single vessel CABG (LIMA to LAD) and surgical AVR given his young age and given the pathology associated with bicuspid aortic valves. Given his prior liver transplantation, he will be higher risk for CABG/AVR. If he is not felt to be a good candidate for surgery, will plan PCI of the LAD followed by TAVR. Will review with the structural heart team and plan for him to see Dr. Cyndia Bent in the CT surgery office following his CT scans.     Risk Assessment/Calculations:         ASSESSMENT:    1. S/P AVR (aortic valve replacement)   2. S/P CABG x 1   3. Essential hypertension   4. Hyperlipidemia, unspecified hyperlipidemia type   5. History of pulmonary embolism   6. Liver transplant recipient [facility])      PLAN:  In order of problems listed above:  S/P CABG x 1 and AVR 04/03/21-walking 1/4 mile daily. Plans to do cardiac rehab. Continue ASA, metoprolol and Pravachol, some mild dyspnea on exertion and cough with decreased breath sounds that cleared with cough-increase incentive spirometry. For CXR next week with Dr. Cyndia Bent  HTN BP well controlled on metoprolol labs in care everywhere K 5.4-recheck today, Hgb improved.  HLD on pravachol   Pulm Embolism incidental finding on CT f/u CT no central obstruction treated  with eliquis for 3 months now off.  History of liver transplant followed at [place] Making/Informed Consent        Medication Adjustments/Labs and Tests Ordered: Current medicines are reviewed at length with the patient today.  Concerns regarding medicines are outlined above.  Medication changes, Labs and Tests ordered today  are listed in the Patient Instructions below. Patient Instructions  Medication Instructions:  Your physician recommends that you continue on your current medications as directed. Please refer to the Current Medication list given to you today.  *If you need a refill on your cardiac medications before your next appointment, please call your pharmacy*   Lab Work: TODAY:  BMET  If you have labs (blood work) drawn today and your tests are completely normal, you will receive your results only by: [place] (if you have MyChart) OR A paper copy in the mail If you have any lab test that is abnormal or we need to change your treatment, we will call you to review the results.   Testing/Procedures:    Follow-Up: At [facility], you and your health needs are our priority.  As part of our continuing mission to provide you with exceptional heart care, we have created designated Provider Care Teams.  These Care Teams include your primary Cardiologist (physician) and Advanced Practice Providers (APPs -  Physician Assistants and Nurse Practitioners) who all work together to provide you with the care you need, when you need it.  We recommend signing up for the patient portal called "MyChart".  Sign up information is provided on this After Visit Summary.  MyChart is used to connect with patients for Virtual Visits (Telemedicine).  Patients are able to view lab/test results, encounter notes, upcoming appointments, etc.  Non-urgent messages can be sent to your provider as well.   To learn more about what you can do with MyChart, go to [email or web address].    Your next appointment:   08/06/2021 arrive at 8:00  The format for your next appointment:   In Person  Provider:   Dorris Carnes, MD    Other Instructions Use your Spirometer   Signed, Ermalinda Barrios, PA-C  04/23/2021 12:13 PM    [address], [place], [place]  [address] Phone: [phone]; Fax:  [phone]

## 2021-04-17 DIAGNOSIS — I2510 Atherosclerotic heart disease of native coronary artery without angina pectoris: Secondary | ICD-10-CM | POA: Diagnosis not present

## 2021-04-17 DIAGNOSIS — Z79620 Long term (current) use of immunosuppressive biologic: Secondary | ICD-10-CM | POA: Diagnosis not present

## 2021-04-17 DIAGNOSIS — E119 Type 2 diabetes mellitus without complications: Secondary | ICD-10-CM | POA: Diagnosis not present

## 2021-04-17 DIAGNOSIS — R7401 Elevation of levels of liver transaminase levels: Secondary | ICD-10-CM | POA: Diagnosis not present

## 2021-04-17 DIAGNOSIS — Z944 Liver transplant status: Secondary | ICD-10-CM | POA: Diagnosis not present

## 2021-04-17 DIAGNOSIS — Z48812 Encounter for surgical aftercare following surgery on the circulatory system: Secondary | ICD-10-CM | POA: Diagnosis not present

## 2021-04-17 DIAGNOSIS — M6281 Muscle weakness (generalized): Secondary | ICD-10-CM | POA: Diagnosis not present

## 2021-04-17 DIAGNOSIS — Z7984 Long term (current) use of oral hypoglycemic drugs: Secondary | ICD-10-CM | POA: Diagnosis not present

## 2021-04-17 DIAGNOSIS — Z952 Presence of prosthetic heart valve: Secondary | ICD-10-CM | POA: Diagnosis not present

## 2021-04-17 DIAGNOSIS — G4733 Obstructive sleep apnea (adult) (pediatric): Secondary | ICD-10-CM | POA: Diagnosis not present

## 2021-04-17 MED FILL — TACROLIMUS 1 MG CAPSULE, IMMEDIATE-RELEASE: ORAL | 30 days supply | Qty: 120 | Fill #9

## 2021-04-18 DIAGNOSIS — Z5181 Encounter for therapeutic drug level monitoring: Secondary | ICD-10-CM | POA: Diagnosis not present

## 2021-04-18 DIAGNOSIS — Z944 Liver transplant status: Secondary | ICD-10-CM | POA: Diagnosis not present

## 2021-04-18 DIAGNOSIS — E612 Magnesium deficiency: Secondary | ICD-10-CM | POA: Diagnosis not present

## 2021-04-19 LAB — CBC W/ DIFFERENTIAL
BANDED NEUTROPHILS ABSOLUTE COUNT: 0.1 10*3/uL (ref 0.0–0.1)
BASOPHILS ABSOLUTE COUNT: 0.1 10*3/uL (ref 0.0–0.2)
BASOPHILS RELATIVE PERCENT: 1 %
EOSINOPHILS ABSOLUTE COUNT: 0.5 10*3/uL — ABNORMAL HIGH (ref 0.0–0.4)
EOSINOPHILS RELATIVE PERCENT: 5 %
HEMATOCRIT: 31.4 % — ABNORMAL LOW (ref 37.5–51.0)
HEMOGLOBIN: 9.8 g/dL — ABNORMAL LOW (ref 13.0–17.7)
IMMATURE GRANULOCYTES: 1 %
LYMPHOCYTES ABSOLUTE COUNT: 1.7 10*3/uL (ref 0.7–3.1)
LYMPHOCYTES RELATIVE PERCENT: 16 %
MEAN CORPUSCULAR HEMOGLOBIN CONC: 31.2 g/dL — ABNORMAL LOW (ref 31.5–35.7)
MEAN CORPUSCULAR HEMOGLOBIN: 28.3 pg (ref 26.6–33.0)
MEAN CORPUSCULAR VOLUME: 91 fL (ref 79–97)
MONOCYTES ABSOLUTE COUNT: 0.9 10*3/uL (ref 0.1–0.9)
MONOCYTES RELATIVE PERCENT: 8 %
NEUTROPHILS ABSOLUTE COUNT: 7.2 10*3/uL — ABNORMAL HIGH (ref 1.4–7.0)
NEUTROPHILS RELATIVE PERCENT: 69 %
PLATELET COUNT: 554 10*3/uL — ABNORMAL HIGH (ref 150–450)
RED BLOOD CELL COUNT: 3.46 x10E6/uL — ABNORMAL LOW (ref 4.14–5.80)
RED CELL DISTRIBUTION WIDTH: 13.2 % (ref 11.6–15.4)
WHITE BLOOD CELL COUNT: 10.5 10*3/uL (ref 3.4–10.8)

## 2021-04-19 LAB — COMPREHENSIVE METABOLIC PANEL
A/G RATIO: 1.7 (ref 1.2–2.2)
ALBUMIN: 4.2 g/dL (ref 3.8–4.9)
ALKALINE PHOSPHATASE: 102 IU/L (ref 44–121)
ALT (SGPT): 28 IU/L (ref 0–44)
AST (SGOT): 23 IU/L (ref 0–40)
BILIRUBIN TOTAL (MG/DL) IN SER/PLAS: 0.3 mg/dL (ref 0.0–1.2)
BLOOD UREA NITROGEN: 13 mg/dL (ref 6–24)
BUN / CREAT RATIO: 12 (ref 9–20)
CALCIUM: 9 mg/dL (ref 8.7–10.2)
CHLORIDE: 107 mmol/L — ABNORMAL HIGH (ref 96–106)
CO2: 18 mmol/L — ABNORMAL LOW (ref 20–29)
CREATININE: 1.07 mg/dL (ref 0.76–1.27)
GLOBULIN, TOTAL: 2.5 g/dL (ref 1.5–4.5)
GLUCOSE: 135 mg/dL — ABNORMAL HIGH (ref 70–99)
POTASSIUM: 5.4 mmol/L — ABNORMAL HIGH (ref 3.5–5.2)
SODIUM: 142 mmol/L (ref 134–144)
TOTAL PROTEIN: 6.7 g/dL (ref 6.0–8.5)

## 2021-04-19 LAB — GAMMA GT: GAMMA GLUTAMYL TRANSFERASE: 74 IU/L — ABNORMAL HIGH (ref 0–65)

## 2021-04-19 LAB — PHOSPHORUS: PHOSPHORUS, SERUM: 3.9 mg/dL (ref 2.8–4.1)

## 2021-04-19 LAB — BILIRUBIN, DIRECT: BILIRUBIN DIRECT: 0.19 mg/dL (ref 0.00–0.40)

## 2021-04-19 LAB — MAGNESIUM: MAGNESIUM: 1.9 mg/dL (ref 1.6–2.3)

## 2021-04-21 DIAGNOSIS — Z944 Liver transplant status: Principal | ICD-10-CM

## 2021-04-21 DIAGNOSIS — Z5181 Encounter for therapeutic drug level monitoring: Principal | ICD-10-CM

## 2021-04-21 DIAGNOSIS — E612 Magnesium deficiency: Principal | ICD-10-CM

## 2021-04-21 LAB — TACROLIMUS LEVEL: TACROLIMUS BLOOD: 3.4 ng/mL (ref 2.0–20.0)

## 2021-04-21 NOTE — Unmapped (Signed)
Patient's labs from 2/24 with dramatic drop in Hgb/Hct. Patient messaged TNC that he had cardiac valve replacement/cabg earlier this month. Contacted patient today to discuss labs. He reports he is feeling well, but still has some sob with activity. Explained this could likely be r/t his drop in anemia from the procedure. He will f/up with his cardiologist on Wed this week. Encouraged him to reduce his dietary K, since serum potassium was elevate to 5.4. He verbalized understanding and planned to repeat labs in 1 mo to monitor his cbc. Let him know TNC would forward his lab results to his cardiologist.

## 2021-04-22 ENCOUNTER — Telehealth (HOSPITAL_COMMUNITY): Payer: Self-pay | Admitting: Family Medicine

## 2021-04-23 ENCOUNTER — Encounter: Payer: Self-pay | Admitting: Physician Assistant

## 2021-04-23 ENCOUNTER — Ambulatory Visit: Payer: Medicare Other | Admitting: Physician Assistant

## 2021-04-23 ENCOUNTER — Telehealth: Payer: Self-pay | Admitting: Family Medicine

## 2021-04-23 ENCOUNTER — Other Ambulatory Visit: Payer: Self-pay

## 2021-04-23 VITALS — BP 136/70 | HR 77 | Ht 68.5 in | Wt 200.2 lb

## 2021-04-23 DIAGNOSIS — Z952 Presence of prosthetic heart valve: Secondary | ICD-10-CM | POA: Diagnosis not present

## 2021-04-23 DIAGNOSIS — Z951 Presence of aortocoronary bypass graft: Secondary | ICD-10-CM

## 2021-04-23 DIAGNOSIS — Z944 Liver transplant status: Secondary | ICD-10-CM

## 2021-04-23 DIAGNOSIS — I10 Essential (primary) hypertension: Secondary | ICD-10-CM | POA: Diagnosis not present

## 2021-04-23 DIAGNOSIS — E785 Hyperlipidemia, unspecified: Secondary | ICD-10-CM

## 2021-04-23 DIAGNOSIS — Z86711 Personal history of pulmonary embolism: Secondary | ICD-10-CM

## 2021-04-23 LAB — BASIC METABOLIC PANEL
BUN/Creatinine Ratio: 19 (ref 9–20)
BUN: 21 mg/dL (ref 6–24)
CO2: 21 mmol/L (ref 20–29)
Calcium: 9.8 mg/dL (ref 8.7–10.2)
Chloride: 100 mmol/L (ref 96–106)
Creatinine, Ser: 1.11 mg/dL (ref 0.76–1.27)
Glucose: 90 mg/dL (ref 70–99)
Potassium: 5.4 mmol/L — ABNORMAL HIGH (ref 3.5–5.2)
Sodium: 137 mmol/L (ref 134–144)
eGFR: 77 mL/min/{1.73_m2} (ref >59)

## 2021-04-23 NOTE — Telephone Encounter (Signed)
Dr. Cyndia Bent- I greatly appreciate your help with this patient.  He has f/u with you scheduled for 04/30/21.  I got a note from [facility] about his HGB of 9.8 on 04/18/21.  This is improved from 8.8 on 04/07/21.  I think this is from his expected and resolving post op anemia.   ? ?If you can recheck his CBC (or let me know if I need to do so in the future) then I would greatly appreciate it.  Thanks.  ?

## 2021-04-23 NOTE — Patient Instructions (Addendum)
Medication Instructions:  ?Your physician recommends that you continue on your current medications as directed. Please refer to the Current Medication list given to you today. ? ?*If you need a refill on your cardiac medications before your next appointment, please call your pharmacy* ? ? ?Lab Work: ?TODAY:  BMET ? ?If you have labs (blood work) drawn today and your tests are completely normal, you will receive your results only by: ?MyChart Message (if you have MyChart) OR ?A paper copy in the mail ?If you have any lab test that is abnormal or we need to change your treatment, we will call you to review the results. ? ? ?Testing/Procedures: ? ? ? ?Follow-Up: ?At [facility], you and your health needs are our priority.  As part of our continuing mission to provide you with exceptional heart care, we have created designated Provider Care Teams.  These Care Teams include your primary Cardiologist (physician) and Advanced Practice Providers (APPs -  Physician Assistants and Nurse Practitioners) who all work together to provide you with the care you need, when you need it. ? ?We recommend signing up for the patient portal called "MyChart".  Sign up information is provided on this After Visit Summary.  MyChart is used to connect with patients for Virtual Visits (Telemedicine).  Patients are able to view lab/test results, encounter notes, upcoming appointments, etc.  Non-urgent messages can be sent to your provider as well.   ?To learn more about what you can do with MyChart, go to [email or web address].   ? ?Your next appointment:   ?08/06/2021 arrive at 8:00 ? ?The format for your next appointment:   ?In Person ? ?Provider:   ?Dorris Carnes, MD  ? ? ?Other Instructions ?Use your Spirometer ?

## 2021-04-24 ENCOUNTER — Other Ambulatory Visit: Payer: Self-pay

## 2021-04-24 DIAGNOSIS — I10 Essential (primary) hypertension: Secondary | ICD-10-CM

## 2021-04-24 NOTE — Unmapped (Signed)
Called pt to schedule annual appt. Explained it would be in July and he needs abd MRI w sedation and Chest CT along with clinic appt. Found out his availability was any day in July except Tuesdays and he can start early. Went over MRI questions with pt since he recently had bypass and aortic valve replacement. Told him this tpa would call him back when scheduled, and he verbalized understanding.    Called Shanda Bumps in MRI since questions said to if certain questions answered with yes. She said nothing special was needed.

## 2021-04-28 DIAGNOSIS — Z944 Liver transplant status: Principal | ICD-10-CM

## 2021-04-28 DIAGNOSIS — Z5181 Encounter for therapeutic drug level monitoring: Principal | ICD-10-CM

## 2021-04-28 DIAGNOSIS — E612 Magnesium deficiency: Principal | ICD-10-CM

## 2021-04-28 DIAGNOSIS — Z79620 Long term (current) use of immunosuppressive biologic: Secondary | ICD-10-CM | POA: Diagnosis not present

## 2021-04-28 DIAGNOSIS — Z952 Presence of prosthetic heart valve: Secondary | ICD-10-CM | POA: Diagnosis not present

## 2021-04-28 DIAGNOSIS — M6281 Muscle weakness (generalized): Secondary | ICD-10-CM | POA: Diagnosis not present

## 2021-04-28 DIAGNOSIS — E119 Type 2 diabetes mellitus without complications: Secondary | ICD-10-CM | POA: Diagnosis not present

## 2021-04-28 DIAGNOSIS — Z48812 Encounter for surgical aftercare following surgery on the circulatory system: Secondary | ICD-10-CM | POA: Diagnosis not present

## 2021-04-28 DIAGNOSIS — Z7984 Long term (current) use of oral hypoglycemic drugs: Secondary | ICD-10-CM | POA: Diagnosis not present

## 2021-04-28 DIAGNOSIS — R7401 Elevation of levels of liver transaminase levels: Secondary | ICD-10-CM | POA: Diagnosis not present

## 2021-04-28 DIAGNOSIS — G4733 Obstructive sleep apnea (adult) (pediatric): Secondary | ICD-10-CM | POA: Diagnosis not present

## 2021-04-28 DIAGNOSIS — I2510 Atherosclerotic heart disease of native coronary artery without angina pectoris: Secondary | ICD-10-CM | POA: Diagnosis not present

## 2021-04-29 ENCOUNTER — Other Ambulatory Visit: Payer: Self-pay | Admitting: Surgery

## 2021-04-29 ENCOUNTER — Other Ambulatory Visit: Payer: Self-pay | Admitting: Surgical

## 2021-04-29 DIAGNOSIS — Z952 Presence of prosthetic heart valve: Secondary | ICD-10-CM

## 2021-04-30 ENCOUNTER — Encounter: Payer: Self-pay | Admitting: Surgery

## 2021-04-30 ENCOUNTER — Other Ambulatory Visit: Payer: Self-pay | Admitting: Surgical

## 2021-04-30 ENCOUNTER — Ambulatory Visit (INDEPENDENT_AMBULATORY_CARE_PROVIDER_SITE_OTHER): Payer: Self-pay | Admitting: Surgery

## 2021-04-30 ENCOUNTER — Other Ambulatory Visit: Payer: Self-pay

## 2021-04-30 ENCOUNTER — Ambulatory Visit
Admission: RE | Admit: 2021-04-30 | Discharge: 2021-04-30 | Disposition: A | Discharge status: Home / Self Care | Payer: Medicare Other | Source: Ambulatory Visit | Attending: Surgery | Admitting: Surgery

## 2021-04-30 VITALS — BP 107/75 | HR 70 | Resp 20 | Ht 69.0 in | Wt 198.0 lb

## 2021-04-30 DIAGNOSIS — R918 Other nonspecific abnormal finding of lung field: Secondary | ICD-10-CM | POA: Diagnosis not present

## 2021-04-30 DIAGNOSIS — Z951 Presence of aortocoronary bypass graft: Secondary | ICD-10-CM | POA: Diagnosis not present

## 2021-04-30 DIAGNOSIS — Z952 Presence of prosthetic heart valve: Secondary | ICD-10-CM

## 2021-04-30 DIAGNOSIS — I350 Nonrheumatic aortic (valve) stenosis: Secondary | ICD-10-CM

## 2021-04-30 DIAGNOSIS — J90 Pleural effusion, not elsewhere classified: Secondary | ICD-10-CM | POA: Diagnosis not present

## 2021-04-30 DIAGNOSIS — I2510 Atherosclerotic heart disease of native coronary artery without angina pectoris: Secondary | ICD-10-CM

## 2021-04-30 DIAGNOSIS — I700 Atherosclerosis of aorta: Secondary | ICD-10-CM | POA: Diagnosis not present

## 2021-04-30 NOTE — Progress Notes (Signed)
? ? ?HPI: ?Patient returns for routine postoperative follow-up having undergone coronary bypass graft surgery x1 using a left internal mammary artery graft to the LAD and aortic valve replacement using a 27 mm Edwards pericardial valve on 04/03/2021.  He has a history of prior liver transplant. ?The patient's early postoperative recovery while in the hospital was notable for an uncomplicated postoperative course. ?Since hospital discharge the patient reports that he has been walking daily without chest pain.  He has mild exertional dyspnea with walking longer distances but that is improving.  He has no peripheral edema.  He has been eating well. ? ? ?Current Outpatient Medications  ?Medication Sig Dispense Refill  ? acetaminophen (TYLENOL) 500 MG tablet Take 500 mg by mouth every 6 (six) hours as needed for moderate pain or headache.    ? albuterol (VENTOLIN HFA) 108 (90 Base) MCG/ACT inhaler Inhale 2 puffs into the lungs every 6 (six) hours as needed for wheezing or shortness of breath.    ? alendronate (FOSAMAX) 70 MG tablet Take 1 tablet (70 mg total) by mouth every Sunday. Take with a full glass of water on an empty stomach. 13 tablet 3  ? aspirin EC 325 MG EC tablet Take 1 tablet (325 mg total) by mouth daily.    ? azelastine (ASTELIN) 0.1 % nasal spray PLACE 2 SPRAYS INTO BOTH NOSTRILS TWICE DAILY AS NEEDED 30 mL 1  ? calcium carbonate (TUMS - DOSED IN MG ELEMENTAL CALCIUM) 500 MG chewable tablet Chew 1 tablet by mouth daily.    ? Cholecalciferol (VITAMIN D) 50 MCG (2000 UT) tablet Take 2,000 Units by mouth daily.    ? empagliflozin (JARDIANCE) 10 MG TABS tablet Take 1 tablet (10 mg total) by mouth daily before breakfast. 30 tablet 5  ? ferrous [id number] C-folic acid (TRINSICON / FOLTRIN) capsule Take 1 capsule by mouth 2 (two) times daily after a meal. 60 capsule 1  ? fluticasone (FLONASE) 50 MCG/ACT nasal spray PLACE ONE OR TWO SPRAYS INTO BOTH NOSTRILS DAILY AS NEEDED. 48 g 3  ? levothyroxine  (SYNTHROID) 150 MCG tablet Take 1 tablet (150 mcg total) by mouth daily before breakfast.    ? metFORMIN (GLUCOPHAGE-XR) 500 MG 24 hr tablet Take 2 tablets (1,000 mg total) by mouth in the morning and at bedtime. 360 tablet 3  ? metoprolol tartrate (LOPRESSOR) 25 MG tablet Take 1 tablet (25 mg total) by mouth 2 (two) times daily. 60 tablet 1  ? metroNIDAZOLE (METROGEL) 1 % gel Apply 1 application topically daily as needed (rosacea).    ? mycophenolate (CELLCEPT) 250 MG capsule Take 250 mg by mouth 2 (two) times daily.    ? OneTouch Delica Lancets 47M MISC USE TO CHECK SUGAR DAILY 100 each 3  ? ONETOUCH ULTRA test strip USE TO CHECK SUGAR DAILY 100 strip 0  ? pravastatin (PRAVACHOL) 40 MG tablet Take 40 mg by mouth every evening.    ? sertraline (ZOLOFT) 50 MG tablet Take 1 tablet (50 mg total) by mouth daily. 90 tablet 3  ? tacrolimus (PROGRAF) 1 MG capsule Take 2 mg by mouth 2 (two) times daily.    ? ?No current facility-administered medications for this visit.  ? ? ?Physical Exam: ?BP 107/75   Pulse 70   Resp 20   Ht '5\' 9"'$  (1.753 m)   Wt 198 lb (89.8 kg)   SpO2 95% Comment: RA  BMI 29.24 kg/m?  ?He looks well. ?Cardiac exam shows regular rate and rhythm with normal heart  sounds.  There is no murmur. ?Lungs are clear. ?The chest incision is healing well and the sternum is stable. ?There is no peripheral edema. ? ?Diagnostic Tests: ? ?Narrative & Impression  ?CLINICAL DATA:  61 year old male status post CABG and aortic valve ?replacement. ?  ?EXAM: ?CHEST - 2 VIEW ?  ?COMPARISON:  Chest x-ray 04/05/2021. ?  ?FINDINGS: ?Mild blunting of the left costophrenic sulcus, compatible with a ?small left pleural effusion. No right pleural effusion. Lung volumes ?are normal. No consolidative airspace disease. No pneumothorax. No ?pulmonary nodule or mass noted. Pulmonary vasculature and the ?cardiomediastinal silhouette are within normal limits. ?Atherosclerosis in the thoracic aorta. Status post median sternotomy ?for  CABG and aortic valve replacement with what appears to be a ?stented bioprosthesis. ?  ?IMPRESSION: ?1. Small residual left pleural effusion. ?  ?  ?Electronically Signed ?  By: Vinnie Langton M.D. ?  On: 04/30/2021 08:28  ? ? ?Impression: ? ?He is doing well 1 month following his surgery.  I told him he could return to driving a car but should refrain from lifting anything heavier than 10 pounds for 3 months postoperatively.  I encouraged him to continue walking as much as possible.  He said that he had contacted cardiac rehab but had not heard back from them yet.  He is scheduled for a follow-up baseline echocardiogram at the end of March and will continue to follow-up with cardiology and his transplant doctors after that. ? ?Plan: ? ?He will continue to follow-up with his PCP, cardiology and his transplant physicians.  He will return to see me if he has any problems with his incisions. ? ? ?Gaye Pollack, MD ?[facility] ?[phone] ?

## 2021-05-05 DIAGNOSIS — E612 Magnesium deficiency: Principal | ICD-10-CM

## 2021-05-05 DIAGNOSIS — Z944 Liver transplant status: Principal | ICD-10-CM

## 2021-05-05 DIAGNOSIS — Z5181 Encounter for therapeutic drug level monitoring: Principal | ICD-10-CM

## 2021-05-06 MED ORDER — ALENDRONATE 70 MG TABLET
ORAL_TABLET | ORAL | 2 refills | 28.00000 days | Status: CN
Start: 2021-05-06 — End: 2021-08-04

## 2021-05-06 NOTE — Unmapped (Signed)
[facility] Clinical Assessment & Refill Coordination Note    Anthony Alvarado, Anthony Alvarado: 1960/06/22  Phone: [phone] (home)     All above HIPAA information was verified with patient.     Was a Nurse, learning disability used for this call? No    Specialty Medication(s):   Transplant: tacrolimus 1mg      Current Outpatient Medications   Medication Sig Dispense Refill   ??? alendronate (FOSAMAX) 70 MG tablet Take 1 tablet (70 mg total) by mouth every seven (7) days. 4 tablet 2   ??? aspirin 325 MG tablet Take 325 mg by mouth daily.     ??? azelastine (ASTELIN) 137 mcg (0.1 %) nasal spray 1 spray by Each Nare route daily as needed.      ??? blood sugar diagnostic (ONETOUCH ULTRA TEST) Strp USE TO CHECK SUGAR DAILY     ??? blood sugar diagnostic Strp Use as directed Three (3) times a day before meals. 90 each 11   ??? calcium carbonate (TUMS) 200 mg calcium (500 mg) chewable tablet Chew 1 tablet daily.     ??? clindamycin (CLEOCIN T) 1 % lotion Apply topically to face for acne daily 60 mL 2   ??? clobetasoL (TEMOVATE) 0.05 % ointment Apply topically Two (2) times a day to itchy spots 60 g 1   ??? FEROCON 110-0.5 mg capsule TAKE 1 CAPSULE BY MOUTH 2 (TWO) TIMES DAILY AFTER A MEAL. NOT COVERED BY INSURANCE.     ??? fluticasone propionate (FLONASE) 50 mcg/actuation nasal spray PLACE ONE OR TWO SPRAYS INTO BOTH NOSTRILS DAILY AS NEEDED.     ??? JARDIANCE 10 mg tablet TAKE 1 TABLET BY MOUTH DAILY BEFORE BREAKFAST.     ??? lancets 33 gauge Misc 1 each by Miscellaneous route Three (3) times a day before meals. 100 each 11   ??? levothyroxine (SYNTHROID) 150 MCG tablet Take 1 tablet (150 mcg total) by mouth daily. 30 tablet 11   ??? metFORMIN (GLUCOPHAGE-XR) 500 MG 24 hr tablet Take 2 tablets (1,000 mg total) by mouth in the morning and at bedtime. 360 tablet 3   ??? metoprolol tartrate (LOPRESSOR) 25 MG tablet Take 25 mg by mouth Two (2) times a day.     ??? mycophenolate (CELLCEPT) 250 mg capsule Take 1 capsule (250 mg total) by mouth Two (2) times a day. 60 capsule 11   ??? pravastatin (PRAVACHOL) 40 MG tablet Take 1 tablet (40 mg total) by mouth every evening. 90 tablet 3   ??? sertraline (ZOLOFT) 50 MG tablet Take 1 tablet (50 mg total) by mouth daily. 90 tablet 3   ??? tacrolimus (PROGRAF) 1 MG capsule Take 2 capsules (2 mg total) by mouth two (2) times a day. 120 capsule 11   ??? testosterone (ANDROGEL) 1 % (25 mg/2.5gram) GlPk APPLY 2.5G (1 PACKET) TO EACH SHOULDER/UPPER ARM DAILY FOR A TOTAL DAILY DOSE OF 5G 150 packet 0     No current facility-administered medications for this visit.     Facility-Administered Medications Ordered in Other Visits   Medication Dose Route Frequency Provider Last Rate Last Admin   ??? diazePAM (VALIUM) 5 mg/mL injection                 Changes to medications: Anthony Alvarado reports no changes at this time.    Allergies   Allergen Reactions   ??? Watermelon Flavor      Mouth itching       Changes to allergies: No  SPECIALTY MEDICATION ADHERENCE     Tacrolimus 1mg   : 10 days of medicine on hand       Medication Adherence    Patient reported X missed doses in the last month: 0  Specialty Medication: tacrolimus 1mg           Specialty medication(s) dose(s) confirmed: Regimen is correct and unchanged.     Are there any concerns with adherence? No    Adherence counseling provided? Not needed    CLINICAL MANAGEMENT AND INTERVENTION      Clinical Benefit Assessment:    Do you feel the medicine is effective or helping your condition? Yes    Clinical Benefit counseling provided? Not needed    Adverse Effects Assessment:    Are you experiencing any side effects? No    Are you experiencing difficulty administering your medicine? No    Quality of Life Assessment:         How many days over the past month did your transplant  keep you from your normal activities? For example, brushing your teeth or getting up in the morning. 0    Have you discussed this with your provider? Not needed    Acute Infection Status:    Acute infections noted within Epic:  No active infections  Patient reported infection: None    Therapy Appropriateness:    Is therapy appropriate and patient progressing towards therapeutic goals? Yes, therapy is appropriate and should be continued    DISEASE/MEDICATION-SPECIFIC INFORMATION      N/A    PATIENT SPECIFIC NEEDS     - Does the patient have any physical, cognitive, or cultural barriers? No    - Is the patient high risk? Yes, patient is taking a REMS drug. Medication is dispensed in compliance with REMS program not from [facility]sc    - Does the patient require a Care Management Plan? No           SHIPPING     Specialty Medication(s) to be Shipped:   Transplant: tacrolimus 1mg     Other medication(s) to be shipped: fosamax     Changes to insurance: No    Delivery Scheduled: Yes, Expected medication delivery date: 05/13/2021.  However, Rx request for refills was sent to the provider as there are none remaining.     Medication will be delivered via UPS to the confirmed prescription address in [facility].    The patient will receive a drug information handout for each medication shipped and additional FDA Medication Guides as required.  Verified that patient has previously received a Conservation officer, historic buildings and a Surveyor, mining.    The patient or caregiver noted above participated in the development of this care plan and knows that they can request review of or adjustments to the care plan at any time.      All of the patient's questions and concerns have been addressed.    Anthony Alvarado   [facility] Specialty Pharmacist

## 2021-05-07 DIAGNOSIS — G4733 Obstructive sleep apnea (adult) (pediatric): Secondary | ICD-10-CM | POA: Diagnosis not present

## 2021-05-08 ENCOUNTER — Other Ambulatory Visit: Payer: Medicare Other

## 2021-05-08 ENCOUNTER — Other Ambulatory Visit: Payer: Self-pay

## 2021-05-08 DIAGNOSIS — I10 Essential (primary) hypertension: Secondary | ICD-10-CM | POA: Diagnosis not present

## 2021-05-08 LAB — BASIC METABOLIC PANEL
BUN/Creatinine Ratio: 20 (ref 9–20)
BUN: 20 mg/dL (ref 6–24)
CO2: 23 mmol/L (ref 20–29)
Calcium: 9.5 mg/dL (ref 8.7–10.2)
Chloride: 100 mmol/L (ref 96–106)
Creatinine, Ser: 1 mg/dL (ref 0.76–1.27)
Glucose: 122 mg/dL — ABNORMAL HIGH (ref 70–99)
Potassium: 5 mmol/L (ref 3.5–5.2)
Sodium: 138 mmol/L (ref 134–144)
eGFR: 87 mL/min/{1.73_m2} (ref >59)

## 2021-05-12 DIAGNOSIS — Z5181 Encounter for therapeutic drug level monitoring: Principal | ICD-10-CM

## 2021-05-12 DIAGNOSIS — Z944 Liver transplant status: Principal | ICD-10-CM

## 2021-05-12 DIAGNOSIS — E612 Magnesium deficiency: Principal | ICD-10-CM

## 2021-05-12 MED FILL — TACROLIMUS 1 MG CAPSULE, IMMEDIATE-RELEASE: ORAL | 30 days supply | Qty: 120 | Fill #10

## 2021-05-15 ENCOUNTER — Other Ambulatory Visit: Payer: Self-pay

## 2021-05-15 ENCOUNTER — Ambulatory Visit (HOSPITAL_COMMUNITY): Payer: Medicare Other | Attending: Cardiovascular Disease

## 2021-05-15 DIAGNOSIS — I350 Nonrheumatic aortic (valve) stenosis: Secondary | ICD-10-CM | POA: Insufficient documentation

## 2021-05-15 DIAGNOSIS — Z952 Presence of prosthetic heart valve: Secondary | ICD-10-CM | POA: Insufficient documentation

## 2021-05-15 LAB — ECHOCARDIOGRAM COMPLETE
AV Mean grad: 6 mmHg
AV Peak grad: 11 mmHg
Ao pk vel: 1.66 m/s
Area-P 1/2: 3.17 cm2
S' Lateral: 3.2 cm

## 2021-05-17 DIAGNOSIS — G4733 Obstructive sleep apnea (adult) (pediatric): Secondary | ICD-10-CM | POA: Diagnosis not present

## 2021-05-19 DIAGNOSIS — Z944 Liver transplant status: Principal | ICD-10-CM

## 2021-05-19 DIAGNOSIS — Z5181 Encounter for therapeutic drug level monitoring: Principal | ICD-10-CM

## 2021-05-19 DIAGNOSIS — E612 Magnesium deficiency: Principal | ICD-10-CM

## 2021-05-20 ENCOUNTER — Telehealth (HOSPITAL_COMMUNITY): Payer: Self-pay | Admitting: Family Medicine

## 2021-05-21 ENCOUNTER — Telehealth (HOSPITAL_COMMUNITY): Payer: Self-pay

## 2021-05-22 ENCOUNTER — Encounter: Payer: Self-pay | Admitting: Family Medicine

## 2021-05-22 MED ORDER — ALENDRONATE SODIUM 70 MG PO TABS: 70.0000 mg | ORAL_TABLET | ORAL | 3 refills | Status: DC

## 2021-05-23 ENCOUNTER — Encounter: Payer: Self-pay | Admitting: Internal Medicine

## 2021-05-23 DIAGNOSIS — Z5181 Encounter for therapeutic drug level monitoring: Secondary | ICD-10-CM | POA: Diagnosis not present

## 2021-05-23 DIAGNOSIS — Z944 Liver transplant status: Secondary | ICD-10-CM | POA: Diagnosis not present

## 2021-05-23 DIAGNOSIS — C220 Liver cell carcinoma: Secondary | ICD-10-CM | POA: Diagnosis not present

## 2021-05-23 DIAGNOSIS — E612 Magnesium deficiency: Secondary | ICD-10-CM | POA: Diagnosis not present

## 2021-05-24 LAB — COMPREHENSIVE METABOLIC PANEL
A/G RATIO: 1.6 (ref 1.2–2.2)
ALBUMIN: 4.5 g/dL (ref 3.8–4.9)
ALKALINE PHOSPHATASE: 104 IU/L (ref 44–121)
ALT (SGPT): 43 IU/L (ref 0–44)
AST (SGOT): 31 IU/L (ref 0–40)
BILIRUBIN TOTAL (MG/DL) IN SER/PLAS: <0.2 mg/dL (ref 0.0–1.2)
BLOOD UREA NITROGEN: 17 mg/dL (ref 6–24)
BUN / CREAT RATIO: 17 (ref 9–20)
CALCIUM: 9.1 mg/dL (ref 8.7–10.2)
CHLORIDE: 103 mmol/L (ref 96–106)
CO2: 22 mmol/L (ref 20–29)
CREATININE: 1.03 mg/dL (ref 0.76–1.27)
GLOBULIN, TOTAL: 2.8 g/dL (ref 1.5–4.5)
GLUCOSE: 126 mg/dL — ABNORMAL HIGH (ref 70–99)
POTASSIUM: 4.9 mmol/L (ref 3.5–5.2)
SODIUM: 139 mmol/L (ref 134–144)
TOTAL PROTEIN: 7.3 g/dL (ref 6.0–8.5)

## 2021-05-24 LAB — CBC W/ DIFFERENTIAL
BANDED NEUTROPHILS ABSOLUTE COUNT: 0.1 10*3/uL (ref 0.0–0.1)
BASOPHILS ABSOLUTE COUNT: 0.1 10*3/uL (ref 0.0–0.2)
BASOPHILS RELATIVE PERCENT: 1 %
EOSINOPHILS ABSOLUTE COUNT: 1.2 10*3/uL — ABNORMAL HIGH (ref 0.0–0.4)
EOSINOPHILS RELATIVE PERCENT: 12 %
HEMATOCRIT: 43 % (ref 37.5–51.0)
HEMOGLOBIN: 13.8 g/dL (ref 13.0–17.7)
IMMATURE GRANULOCYTES: 1 %
LYMPHOCYTES ABSOLUTE COUNT: 2.1 10*3/uL (ref 0.7–3.1)
LYMPHOCYTES RELATIVE PERCENT: 21 %
MEAN CORPUSCULAR HEMOGLOBIN CONC: 32.1 g/dL (ref 31.5–35.7)
MEAN CORPUSCULAR HEMOGLOBIN: 27.6 pg (ref 26.6–33.0)
MEAN CORPUSCULAR VOLUME: 86 fL (ref 79–97)
MONOCYTES ABSOLUTE COUNT: 0.7 10*3/uL (ref 0.1–0.9)
MONOCYTES RELATIVE PERCENT: 7 %
NEUTROPHILS ABSOLUTE COUNT: 6 10*3/uL (ref 1.4–7.0)
NEUTROPHILS RELATIVE PERCENT: 58 %
PLATELET COUNT: 320 10*3/uL (ref 150–450)
RED BLOOD CELL COUNT: 5 x10E6/uL (ref 4.14–5.80)
RED CELL DISTRIBUTION WIDTH: 13.9 % (ref 11.6–15.4)
WHITE BLOOD CELL COUNT: 10.2 10*3/uL (ref 3.4–10.8)

## 2021-05-24 LAB — BILIRUBIN, DIRECT: BILIRUBIN DIRECT: 0.1 mg/dL (ref 0.00–0.40)

## 2021-05-24 LAB — PHOSPHORUS: PHOSPHORUS, SERUM: 2.9 mg/dL (ref 2.8–4.1)

## 2021-05-24 LAB — GAMMA GT: GAMMA GLUTAMYL TRANSFERASE: 51 IU/L (ref 0–65)

## 2021-05-24 LAB — MAGNESIUM: MAGNESIUM: 1.9 mg/dL (ref 1.6–2.3)

## 2021-05-25 MED ORDER — ALENDRONATE 70 MG TABLET
ORAL_TABLET | ORAL | 3 refills | 91 days
Start: 2021-05-25 — End: ?

## 2021-05-26 DIAGNOSIS — E612 Magnesium deficiency: Principal | ICD-10-CM

## 2021-05-26 DIAGNOSIS — C220 Liver cell carcinoma: Principal | ICD-10-CM

## 2021-05-26 DIAGNOSIS — Z5181 Encounter for therapeutic drug level monitoring: Principal | ICD-10-CM

## 2021-05-26 DIAGNOSIS — Z944 Liver transplant status: Principal | ICD-10-CM

## 2021-05-26 LAB — TACROLIMUS LEVEL: TACROLIMUS (FK506),BLOOD: 7.1 ng/mL (ref 2.0–20.0)

## 2021-05-26 NOTE — Unmapped (Signed)
Contacted [facility] and added on AFP testing to 3/31 sample, since it has not been processed by [facility] as ordered on 03/04/21.

## 2021-05-26 NOTE — Unmapped (Signed)
error 

## 2021-05-27 LAB — SPECIMEN STATUS REPORT

## 2021-05-27 LAB — AFP TUMOR MARKER: AFP-TUMOR MARKER: <1.8 ng/mL (ref 0.0–8.4)

## 2021-05-27 MED FILL — ALENDRONATE 70 MG TABLET: ORAL | 84 days supply | Qty: 12 | Fill #0

## 2021-05-28 ENCOUNTER — Other Ambulatory Visit: Payer: Self-pay | Admitting: Surgical

## 2021-06-02 DIAGNOSIS — Z944 Liver transplant status: Principal | ICD-10-CM

## 2021-06-02 DIAGNOSIS — Z5181 Encounter for therapeutic drug level monitoring: Principal | ICD-10-CM

## 2021-06-02 DIAGNOSIS — E612 Magnesium deficiency: Principal | ICD-10-CM

## 2021-06-04 NOTE — Unmapped (Signed)
[facility] Refill Coordination Note    Specialty Medication(s) to be Shipped:   Transplant: tacrolimus 1mg     Other medication(s) to be shipped: No additional medications requested for fill at this time     Anthony Alvarado, DOB: May 12, 1960  Phone: [phone] (home)       All above HIPAA information was verified with patient.     Was a Nurse, learning disability used for this call? No    Completed refill call assessment today to schedule patient's medication shipment from the [facility] [phone]).  All relevant notes have been reviewed.     Specialty medication(s) and dose(s) confirmed: Regimen is correct and unchanged.   Changes to medications: Anthony Alvarado reports no changes at this time.  Changes to insurance: No  New side effects reported not previously addressed with a pharmacist or physician: None reported  Questions for the pharmacist: No    Confirmed patient received a Conservation officer, historic buildings and a Surveyor, mining with first shipment. The patient will receive a drug information handout for each medication shipped and additional FDA Medication Guides as required.       DISEASE/MEDICATION-SPECIFIC INFORMATION        N/A    SPECIALTY MEDICATION ADHERENCE     Medication Adherence    Patient reported X missed doses in the last month: 0  Specialty Medication: tacrolimus 1 MG capsule (PROGRAF)  Patient is on additional specialty medications: No        tacrolimus 1mg  10 days worth of medication on hand.        Were doses missed due to medication being on hold? No      REFERRAL TO PHARMACIST     Referral to the pharmacist: Not needed      [facility]     Shipping address confirmed in Epic.     Delivery Scheduled: Yes, Expected medication delivery date: 06/11/21.     Medication will be delivered via UPS to the prescription address in Epic WAM.    Anthony Alvarado   [facility] Specialty Technician

## 2021-06-07 ENCOUNTER — Other Ambulatory Visit: Payer: Self-pay | Admitting: Surgical

## 2021-06-07 DIAGNOSIS — G4733 Obstructive sleep apnea (adult) (pediatric): Secondary | ICD-10-CM | POA: Diagnosis not present

## 2021-06-09 DIAGNOSIS — Z944 Liver transplant status: Principal | ICD-10-CM

## 2021-06-09 DIAGNOSIS — Z5181 Encounter for therapeutic drug level monitoring: Principal | ICD-10-CM

## 2021-06-09 DIAGNOSIS — E612 Magnesium deficiency: Principal | ICD-10-CM

## 2021-06-10 MED FILL — TACROLIMUS 1 MG CAPSULE, IMMEDIATE-RELEASE: ORAL | 30 days supply | Qty: 120 | Fill #11

## 2021-06-14 ENCOUNTER — Other Ambulatory Visit: Payer: Self-pay | Admitting: Surgical

## 2021-06-16 ENCOUNTER — Other Ambulatory Visit: Payer: Self-pay | Admitting: Family Medicine

## 2021-06-16 DIAGNOSIS — Z5181 Encounter for therapeutic drug level monitoring: Principal | ICD-10-CM

## 2021-06-16 DIAGNOSIS — Z944 Liver transplant status: Principal | ICD-10-CM

## 2021-06-16 DIAGNOSIS — E612 Magnesium deficiency: Principal | ICD-10-CM

## 2021-06-23 DIAGNOSIS — Z5181 Encounter for therapeutic drug level monitoring: Principal | ICD-10-CM

## 2021-06-23 DIAGNOSIS — Z944 Liver transplant status: Principal | ICD-10-CM

## 2021-06-23 DIAGNOSIS — E612 Magnesium deficiency: Principal | ICD-10-CM

## 2021-06-27 ENCOUNTER — Telehealth (HOSPITAL_COMMUNITY): Payer: Self-pay

## 2021-06-27 MED ORDER — PRAVASTATIN 40 MG TABLET
ORAL_TABLET | Freq: Every evening | ORAL | 3 refills | 90 days | Status: CP
Start: 2021-06-27 — End: 2022-06-27
  Filled 2021-06-27: qty 90, 90d supply, fill #0

## 2021-06-27 NOTE — Unmapped (Signed)
The patient is requesting a medication refill

## 2021-06-27 NOTE — Telephone Encounter (Signed)
Called patient to see if he was interested in participating in the Cardiac Rehab Program. Patient stated yes. Patient will come in for orientation on 07/03/21 @ 9AM and will attend the 830AM exercise class. ?  ?Tourist information centre manager. ?

## 2021-06-30 DIAGNOSIS — Z944 Liver transplant status: Principal | ICD-10-CM

## 2021-06-30 DIAGNOSIS — E612 Magnesium deficiency: Principal | ICD-10-CM

## 2021-06-30 DIAGNOSIS — Z5181 Encounter for therapeutic drug level monitoring: Principal | ICD-10-CM

## 2021-07-01 ENCOUNTER — Telehealth (HOSPITAL_COMMUNITY): Payer: Self-pay

## 2021-07-02 ENCOUNTER — Other Ambulatory Visit (HOSPITAL_COMMUNITY): Payer: Self-pay

## 2021-07-02 DIAGNOSIS — Z944 Liver transplant status: Principal | ICD-10-CM

## 2021-07-02 DIAGNOSIS — Z796 Long-term use of immunosuppressant medication: Principal | ICD-10-CM

## 2021-07-02 MED ORDER — TACROLIMUS 1 MG CAPSULE, IMMEDIATE-RELEASE
ORAL_CAPSULE | Freq: Two times a day (BID) | ORAL | 11 refills | 30 days
Start: 2021-07-02 — End: ?

## 2021-07-02 NOTE — Unmapped (Signed)
[facility] Refill Coordination Note    Specialty Medication(s) to be Shipped:   Transplant: tacrolimus 1mg     Other medication(s) to be shipped: metformin and sertraline     Anthony Alvarado, DOB: 11-Oct-1960  Phone: [phone] (home)       All above HIPAA information was verified with patient.     Was a Nurse, learning disability used for this call? No    Completed refill call assessment today to schedule patient's medication shipment from the [facility] [phone]).  All relevant notes have been reviewed.     Specialty medication(s) and dose(s) confirmed: Regimen is correct and unchanged.   Changes to medications: Anthony Alvarado reports no changes at this time.  Changes to insurance: No  New side effects reported not previously addressed with a pharmacist or physician: None reported  Questions for the pharmacist: No    Confirmed patient received a Conservation officer, historic buildings and a Surveyor, mining with first shipment. The patient will receive a drug information handout for each medication shipped and additional FDA Medication Guides as required.       DISEASE/MEDICATION-SPECIFIC INFORMATION        N/A    SPECIALTY MEDICATION ADHERENCE     Medication Adherence    Patient reported X missed doses in the last month: 0  Specialty Medication: Tacrolimus 1mg   Patient is on additional specialty medications: No        Were doses missed due to medication being on hold? No    Tacrolimus 1 mg: 14 days of medicine on hand     REFERRAL TO PHARMACIST     Referral to the pharmacist: Not needed      [facility]     Shipping address confirmed in Epic.     Delivery Scheduled: Yes, Expected medication delivery date: 07/08/2021.     Medication will be delivered via UPS to the prescription address in Epic WAM.    [facility] Special[facility] Technician

## 2021-07-03 ENCOUNTER — Encounter (HOSPITAL_COMMUNITY)
Admission: RE | Admit: 2021-07-03 | Discharge: 2021-07-03 | Disposition: A | Discharge status: Home / Self Care | Payer: Medicare Other | Source: Ambulatory Visit | Attending: Internal Medicine | Admitting: Internal Medicine

## 2021-07-03 ENCOUNTER — Encounter (HOSPITAL_COMMUNITY): Payer: Self-pay

## 2021-07-03 VITALS — BP 142/84 | HR 64 | Ht 69.25 in | Wt 203.9 lb

## 2021-07-03 DIAGNOSIS — Z951 Presence of aortocoronary bypass graft: Secondary | ICD-10-CM | POA: Insufficient documentation

## 2021-07-03 DIAGNOSIS — Z48812 Encounter for surgical aftercare following surgery on the circulatory system: Secondary | ICD-10-CM | POA: Insufficient documentation

## 2021-07-03 DIAGNOSIS — Z952 Presence of prosthetic heart valve: Secondary | ICD-10-CM | POA: Insufficient documentation

## 2021-07-03 HISTORY — DX: Atherosclerotic heart disease of native coronary artery without angina pectoris: I25.10

## 2021-07-03 NOTE — Progress Notes (Signed)
Cardiac Individual Treatment Plan ? ?Patient Details  ?Name: [name] ?MRN: [id number] ?Date of Birth: 1960/12/28 ?Referring Provider:   ?Flowsheet Row CARDIAC REHAB PHASE II ORIENTATION from 07/03/2021 in [place]  ?Referring Provider Dorris Carnes, MD  ? ?  ? ? ?Initial Encounter Date:  ?Flowsheet Row CARDIAC REHAB PHASE II ORIENTATION from 07/03/2021 in [place]  ?Date 07/03/21  ? ?  ? ? ?Visit Diagnosis: 04/03/21 S/P AVR (aortic valve replacement) ? ?04/03/21 S/P CABG x 1 ? ?Patient's Home Medications on Admission: ? ?Current Outpatient Medications:  ?  acetaminophen (TYLENOL) 500 MG tablet, Take 500 mg by mouth every 6 (six) hours as needed for moderate pain or headache., Disp: , Rfl:  ?  albuterol (VENTOLIN HFA) 108 (90 Base) MCG/ACT inhaler, Inhale 2 puffs into the lungs every 6 (six) hours as needed for wheezing or shortness of breath., Disp: , Rfl:  ?  alendronate (FOSAMAX) 70 MG tablet, Take 1 tablet (70 mg total) by mouth every Sunday. Take with a full glass of water on an empty stomach., Disp: 13 tablet, Rfl: 3 ?  aspirin EC 325 MG EC tablet, Take 1 tablet (325 mg total) by mouth daily., Disp: , Rfl:  ?  azelastine (ASTELIN) 0.1 % nasal spray, PLACE 2 SPRAYS INTO BOTH NOSTRILS TWICE DAILY AS NEEDED, Disp: 30 mL, Rfl: 1 ?  calcium carbonate (TUMS - DOSED IN MG ELEMENTAL CALCIUM) 500 MG chewable tablet, Chew 1 tablet by mouth daily., Disp: , Rfl:  ?  Cholecalciferol (VITAMIN D) 50 MCG (2000 UT) tablet, Take 2,000 Units by mouth daily., Disp: , Rfl:  ?  empagliflozin (JARDIANCE) 10 MG TABS tablet, Take 1 tablet (10 mg total) by mouth daily before breakfast., Disp: 30 tablet, Rfl: 5 ?  fluticasone (FLONASE) 50 MCG/ACT nasal spray, PLACE ONE OR TWO SPRAYS INTO BOTH NOSTRILS DAILY AS NEEDED., Disp: 48 g, Rfl: 3 ?  levothyroxine (SYNTHROID) 150 MCG tablet, Take 1 tablet (150 mcg total) by mouth daily before breakfast., Disp: , Rfl:  ?  metFORMIN  (GLUCOPHAGE-XR) 500 MG 24 hr tablet, Take 2 tablets (1,000 mg total) by mouth in the morning and at bedtime., Disp: 360 tablet, Rfl: 3 ?  metoprolol tartrate (LOPRESSOR) 25 MG tablet, TAKE 1 TABLET BY MOUTH TWICE A DAY, Disp: 60 tablet, Rfl: 1 ?  metroNIDAZOLE (METROGEL) 1 % gel, Apply 1 application topically daily as needed (rosacea)., Disp: , Rfl:  ?  mycophenolate (CELLCEPT) 250 MG capsule, Take 250 mg by mouth 2 (two) times daily., Disp: , Rfl:  ?  NON FORMULARY, Pt uses a cpap nightly, Disp: , Rfl:  ?  pravastatin (PRAVACHOL) 40 MG tablet, Take 40 mg by mouth every evening., Disp: , Rfl:  ?  sertraline (ZOLOFT) 50 MG tablet, Take 1 tablet (50 mg total) by mouth daily., Disp: 90 tablet, Rfl: 3 ?  tacrolimus (PROGRAF) 1 MG capsule, Take 2 mg by mouth 2 (two) times daily., Disp: , Rfl:  ?  ferrous [id number] C-folic acid (TRINSICON / FOLTRIN) capsule, Take 1 capsule by mouth 2 (two) times daily after a meal. (Patient not taking: Reported on 07/02/2021), Disp: 60 capsule, Rfl: 1 ?  OneTouch Delica Lancets 92Z MISC, USE TO CHECK SUGAR DAILY, Disp: 100 each, Rfl: 3 ?  ONETOUCH ULTRA test strip, USE TO CHECK SUGAR DAILY, Disp: 100 strip, Rfl: 0 ? ?Past Medical History: ?Past Medical History:  ?Diagnosis Date  ? Allergy   ? Asthma   ?  Bicuspid aortic valve   ? sees dr Harrington Challenger  ? Cirrhosis ([place])   ? Coronary artery disease   ? Diabetes mellitus without complication ([place])   ? DM2 (diabetes mellitus, type 2) ([place])   ? Fatty liver   ? with h/o elevated LFT's  ? GERD (gastroesophageal reflux disease)   ? Heart murmur   ? Hypertension   ? Itching   ? all over last few months  ? Jaundice   ? Liver transplant recipient [facility])   ? 09/16/2018 at [facility]  ? Migraine with aura   ? OSA (obstructive sleep apnea) 09/14/2011  ? PSG 11/08/11>>AHI 31.6, SpO2 low 85%. wears CPAP, pt does not know settings  ? Thyroid disease   ? ? ?Tobacco Use: ?Social History  ? ?Tobacco Use  ?Smoking Status Never  ? Passive exposure: Past  ?Smokeless  Tobacco Never  ? ? ?Labs: ?Review Flowsheet   ? ?  ?  Latest Ref Rng & Units 12/20/2020 03/17/2021 04/01/2021 04/03/2021  ?Labs for ITP Cardiac and Pulmonary Rehab  ?Hemoglobin A1c 4.8 - 5.6 %  8.2   7.4     ?PH, Arterial 7.350 - 7.450 7.326     7.335    ? 7.330    ? 7.264    ? 7.344    ? 7.317    ? 7.333    ? 7.383    ? 7.342    ? 7.345    ? 7.356    ?PCO2 arterial 32.0 - 48.0 mmHg 41.9     43.8    ? 44.4    ? 54.9    ? 46.1    ? 43.0    ? 46.4    ? 40.6    ? 42.8    ? 43.3    ? 38.8    ?Bicarbonate 20.0 - 28.0 mmol/L 25.0    ? 21.9     23.3    ? 23.5    ? 25.0    ? 25.1    ? 22.0    ? 24.6    ? 24.2    ? 23.2    ? 22.6    ? 23.6    ? 21.2    ?TCO2 22 - 32 mmol/L 26    ? 23     25    ? 25    ? 27    ? 25    ? 26    ? 23    ? 24    ? 26    ? 25    ? 23    ? 24    ? 24    ? 25    ? 22    ? 25    ?Acid-base deficit 0.0 - 2.0 mmol/L 2.0    ? 4.0     3.0    ? 3.0    ? 3.0    ? 1.0    ? 4.0    ? 1.0    ? 1.0    ? 2.0    ? 4.0    ? 2.0    ? 3.4    ?O2 Saturation % 72.0    ? 98.0     99.0    ? 99.0    ? 98.0    ? 100.0    ? 100.0    ? 100.0    ? 100.0    ? 100.0    ? 70.0    ?  100.0    ? 97.9    ? ?  04/04/2021  ?Labs for ITP Cardiac and Pulmonary Rehab  ?Hemoglobin A1c   ?PH, Arterial   ?PCO2 arterial   ?Bicarbonate   ?TCO2   ?Acid-base deficit   ?O2 Saturation 63.0    ?  ? ? Multiple values from one day are sorted in reverse-chronological order  ?  ?  ? ? ?Capillary Blood Glucose: ?Lab Results  ?Component Value Date  ? GLUCAP 137 (H) 04/07/2021  ? GLUCAP 151 (H) 04/07/2021  ? GLUCAP 144 (H) 04/06/2021  ? GLUCAP 122 (H) 04/06/2021  ? GLUCAP 143 (H) 04/06/2021  ? ? ? ?Exercise Target Goals: ?Exercise Program Goal: ?Individual exercise prescription set using results from initial 6 min walk test and THRR while considering  patient?s activity barriers and safety.  ? ?Exercise Prescription Goal: ?Starting with aerobic activity 30 plus minutes a day, 3 days per week for initial exercise prescription. Provide home exercise  prescription and guidelines that participant acknowledges understanding prior to discharge. ? ?Activity Barriers & Risk Stratification: ? Activity Barriers & Cardiac Risk Stratification - 07/03/21 1036   ? ?  ? Activity Barriers & Cardiac Risk Stratification  ? Activity Barriers Arthritis;Neck/Spine Problems;Deconditioning;Shortness of Breath;Balance Concerns   ? Cardiac Risk Stratification High   ? ?  ?  ? ?  ? ? ?6 Minute Walk: ? 6 Minute Walk   ? ? [place] Name 07/03/21 1035  ?  ?  ?  ? 6 Minute Walk  ? Phase Initial    ? Distance 1536 feet    ? Walk Time 6 minutes    ? # of Rest Breaks 0    ? MPH 2.91    ? METS 3.71    ? RPE 11    ? Perceived Dyspnea  0    ? VO2 Peak 13    ? Symptoms Yes (comment)    ? Comments Lighthededness    ? Resting HR 67 bpm    ? Resting BP 142/84    ? Resting Oxygen Saturation  100 %    ? Exercise Oxygen Saturation  during 6 min walk 98 %    ? Max Ex. HR 76 bpm    ? Max Ex. BP 156/80    ? 2 Minute Post BP 140/80    ? ?  ?  ? ?  ? ? ?Oxygen Initial Assessment: ? ? ?Oxygen Re-Evaluation: ? ? ?Oxygen Discharge (Final Oxygen Re-Evaluation): ? ? ?Initial Exercise Prescription: ? Initial Exercise Prescription - 07/03/21 1000   ? ?  ? Date of Initial Exercise RX and Referring Provider  ? Date 07/03/21   ? Referring Provider Dorris Carnes, MD   ? Expected Discharge Date 08/29/21   ?  ? Recumb[place] Bike  ? Level 2   ? RPM 60   ? Minutes 15   ? METs 2.5   ?  ? T5 Nustep  ? Level 2   ? SPM 80   ? Minutes 15   ? METs 2.5   ?  ? Prescription Details  ? Frequency (times per week) 3   ? Duration Progress to 30 minutes of continuous aerobic without signs/symptoms of physical distress   ?  ? Intensity  ? THRR 40-80% of Max Heartrate 65-129   ? Ratings of Perceived Exertion 11-13   ? Perceived Dyspnea 0-4   ?  ? Progression  ? Progression Continue progressive overload as per policy without signs/symptoms or physical  distress.   ?  ? Resistance Training  ? Training Prescription Yes   ? Weight 4 lbs   ? Reps 10-15    ? ?  ?  ? ?  ? ? ?Perform Capillary Blood Glucose checks as needed. ? ?Exercise Prescription Changes: ? ? ?Exercise Comments: ? ? ?Exercise Goals and Review: ? ? Exercise Goals   ? ? [place] Name 07/03/21 1037  ?  ?

## 2021-07-03 NOTE — Progress Notes (Signed)
Cardiac Rehab Medication Review by a Pharmacist ? ?Does the patient  feel that his/her medications are working for him/her?  yes ? ?Has the patient been experiencing any side effects to the medications prescribed?  no ? ?Does the patient measure his/her own blood pressure or blood glucose at home?  yes  ? ?Does the patient have any problems obtaining medications due to transportation or finances?   no ? ?Understanding of regimen: good ?Understanding of indications: good ?Potential of compliance: good ? ? ? ?Nurse comments: [name] is taking his medications as prescribed and has a good understanding of what his medications are for. [name] checks his CBG's once a week. [name] reports he has been getting some elevated readings at home. Encouraged [name] to bring his home BP monitor next week for comparison. ? ? ? ?Harrell Gave RN ?07/03/2021 9:29 AM ?  ?

## 2021-07-07 ENCOUNTER — Encounter (HOSPITAL_COMMUNITY)
Admission: RE | Admit: 2021-07-07 | Discharge: 2021-07-07 | Disposition: A | Discharge status: Home / Self Care | Payer: Medicare Other | Source: Ambulatory Visit | Attending: Internal Medicine | Admitting: Internal Medicine

## 2021-07-07 DIAGNOSIS — Z796 Long-term use of immunosuppressant medication: Principal | ICD-10-CM

## 2021-07-07 DIAGNOSIS — Z5181 Encounter for therapeutic drug level monitoring: Principal | ICD-10-CM

## 2021-07-07 DIAGNOSIS — Z944 Liver transplant status: Principal | ICD-10-CM

## 2021-07-07 DIAGNOSIS — E612 Magnesium deficiency: Principal | ICD-10-CM

## 2021-07-07 DIAGNOSIS — Z952 Presence of prosthetic heart valve: Secondary | ICD-10-CM | POA: Diagnosis not present

## 2021-07-07 DIAGNOSIS — Z48812 Encounter for surgical aftercare following surgery on the circulatory system: Secondary | ICD-10-CM | POA: Diagnosis not present

## 2021-07-07 DIAGNOSIS — Z951 Presence of aortocoronary bypass graft: Secondary | ICD-10-CM | POA: Diagnosis not present

## 2021-07-07 DIAGNOSIS — G4733 Obstructive sleep apnea (adult) (pediatric): Secondary | ICD-10-CM | POA: Diagnosis not present

## 2021-07-07 LAB — GLUCOSE, CAPILLARY
Glucose-Capillary: 161 mg/dL — ABNORMAL HIGH (ref 70–99)
Glucose-Capillary: 181 mg/dL — ABNORMAL HIGH (ref 70–99)

## 2021-07-07 MED ORDER — TACROLIMUS 1 MG CAPSULE, IMMEDIATE-RELEASE
ORAL_CAPSULE | Freq: Two times a day (BID) | ORAL | 11 refills | 30 days | Status: CP
Start: 2021-07-07 — End: ?
  Filled 2021-07-07: qty 120, 30d supply, fill #0

## 2021-07-07 MED FILL — METFORMIN ER 500 MG TABLET,EXTENDED RELEASE 24 HR: ORAL | 90 days supply | Qty: 360 | Fill #3

## 2021-07-07 MED FILL — SERTRALINE 50 MG TABLET: ORAL | 90 days supply | Qty: 90 | Fill #3

## 2021-07-07 NOTE — Progress Notes (Signed)
Daily Session Note ? ?Patient Details  ?Name: [name] ?MRN: [id number] ?Date of Birth: 12-11-1960 ?Referring Provider:   ?Flowsheet Row CARDIAC REHAB PHASE II ORIENTATION from 07/03/2021 in [place]  ?Referring Provider Dorris Carnes, MD  ? ?  ? ? ?Encounter Date: 07/07/2021 ? ?Check In: ? Session Check In - 07/07/21 0827   ? ?  ? Check-In  ? Supervising physician immediately available to respond to emergencies [facility] immediately available   ? Physician(s) Dr. Sloan Leiter   ? Location [facility]   ? Staff Present Barnet Pall, RN, Milus Glazier, MS, ACSM-CEP, CCRP, Exercise Physiologist;Kaylee Rosana Hoes, MS, ACSM-CEP, Exercise Physiologist;Jetta Gilford Rile BS, ACSM EP-C, Exercise Physiologist;Olinty Owasa, MS, ACSM CEP, Exercise Physiologist   ? Virtual Visit No   ? Medication changes reported     No   ? Fall or balance concerns reported    No   ? Tobacco Cessation No Change   ? Current number of cigarettes/nicotine per day     0   ? Warm-up and Cool-down Performed as group-led instruction   ? Resistance Training Performed Yes   ? VAD Patient? No   ? PAD/SET Patient? No   ?  ? Pain Assessment  ? Currently in Pain? No/denies   ? Pain Score 0-No pain   ? Multiple Pain Sites No   ? ?  ?  ? ?  ? ? ?Capillary Blood Glucose: ?Results for orders placed or performed during the hospital encounter of 07/07/21 (from the past 24 hour(s))  ?Glucose, capillary     Status: Abnormal  ? Collection Time: 07/07/21  9:11 AM  ?Result Value Ref Range  ? Glucose-Capillary 181 (H) 70 - 99 mg/dL  ? ? ? Exercise Prescription Changes - 07/07/21 0900   ? ?  ? Response to Exercise  ? Blood Pressure (Admit) 122/68   ? Blood Pressure (Exercise) 142/72   ? Blood Pressure (Exit) 130/80   ? Heart Rate (Admit) 67 bpm   ? Heart Rate (Exercise) 81 bpm   ? Heart Rate (Exit) 69 bpm   ? Rating of Perceived Exertion (Exercise) 13   ? Symptoms None   ? Comments Pt's first day in the CRP2 program    ? Duration Continue with 30 min of aerobic exercise without signs/symptoms of physical distress.   ? Intensity THRR unchanged   ?  ? Progression  ? Progression Continue to progress workloads to maintain intensity without signs/symptoms of physical distress.   ? Average METs 1.85   ?  ? Resistance Training  ? Training Prescription Yes   ? Weight 4 lbs   ? Reps 10-15   ? Time 10 Minutes   ?  ? Interval Training  ? Interval Training No   ?  ? Recumbant Bike  ? Level 2   ? Minutes 15   ? METs 1.5   ?  ? T5 Nustep  ? Level 2   ? Minutes 15   ? METs 2.2   ? ?  ?  ? ?  ? ? ?Social History  ? ?Tobacco Use  ?Smoking Status Never  ? Passive exposure: Past  ?Smokeless Tobacco Never  ? ? ?Goals Met:  ?Exercise tolerated well ?No report of concerns or symptoms today ?Strength training completed today ? ?Goals Unmet:  ?Not Applicable ? ?Comments: [name] started cardiac rehab today.  Pt tolerated light exercise without difficulty. VSS, telemetry-Sinus Rhythm, asymptomatic.  Medication list reconciled. Pt denies  barriers to medicaiton compliance.  PSYCHOSOCIAL ASSESSMENT:  PHQ-0. Pt exhibits positive coping skills, hopeful outlook with supportive family. No psychosocial needs identified at this time, no psychosocial interventions necessary.    Pt enjoys working on cars and racing.   Pt oriented to exercise equipment and routine.    Understanding verbalized. Barnet Pall, RN,BSN ?07/07/2021 10:05 AM  ? ? ?Dr. Fransico Him is Medical Director for Cardiac Rehab at [facility]. ?

## 2021-07-09 ENCOUNTER — Encounter (HOSPITAL_COMMUNITY)
Admission: RE | Admit: 2021-07-09 | Discharge: 2021-07-09 | Disposition: A | Discharge status: Home / Self Care | Payer: Medicare Other | Source: Ambulatory Visit | Attending: Internal Medicine | Admitting: Internal Medicine

## 2021-07-09 DIAGNOSIS — Z952 Presence of prosthetic heart valve: Secondary | ICD-10-CM | POA: Diagnosis not present

## 2021-07-09 DIAGNOSIS — Z951 Presence of aortocoronary bypass graft: Secondary | ICD-10-CM

## 2021-07-09 DIAGNOSIS — Z48812 Encounter for surgical aftercare following surgery on the circulatory system: Secondary | ICD-10-CM | POA: Diagnosis not present

## 2021-07-09 LAB — GLUCOSE, CAPILLARY
Glucose-Capillary: 160 mg/dL — ABNORMAL HIGH (ref 70–99)
Glucose-Capillary: 149 mg/dL — ABNORMAL HIGH (ref 70–99)

## 2021-07-11 ENCOUNTER — Encounter (HOSPITAL_COMMUNITY)
Admission: RE | Admit: 2021-07-11 | Discharge: 2021-07-11 | Disposition: A | Discharge status: Home / Self Care | Payer: Medicare Other | Source: Ambulatory Visit | Attending: Internal Medicine | Admitting: Internal Medicine

## 2021-07-11 DIAGNOSIS — Z952 Presence of prosthetic heart valve: Secondary | ICD-10-CM

## 2021-07-11 DIAGNOSIS — Z951 Presence of aortocoronary bypass graft: Secondary | ICD-10-CM | POA: Diagnosis not present

## 2021-07-11 DIAGNOSIS — Z48812 Encounter for surgical aftercare following surgery on the circulatory system: Secondary | ICD-10-CM | POA: Diagnosis not present

## 2021-07-14 ENCOUNTER — Encounter (HOSPITAL_COMMUNITY)
Admission: RE | Admit: 2021-07-14 | Discharge: 2021-07-14 | Disposition: A | Discharge status: Home / Self Care | Payer: Medicare Other | Source: Ambulatory Visit | Attending: Internal Medicine | Admitting: Internal Medicine

## 2021-07-14 DIAGNOSIS — Z5181 Encounter for therapeutic drug level monitoring: Principal | ICD-10-CM

## 2021-07-14 DIAGNOSIS — E612 Magnesium deficiency: Principal | ICD-10-CM

## 2021-07-14 DIAGNOSIS — Z944 Liver transplant status: Principal | ICD-10-CM

## 2021-07-14 DIAGNOSIS — Z951 Presence of aortocoronary bypass graft: Secondary | ICD-10-CM | POA: Diagnosis not present

## 2021-07-14 DIAGNOSIS — Z48812 Encounter for surgical aftercare following surgery on the circulatory system: Secondary | ICD-10-CM | POA: Diagnosis not present

## 2021-07-14 DIAGNOSIS — Z952 Presence of prosthetic heart valve: Secondary | ICD-10-CM

## 2021-07-14 NOTE — Progress Notes (Signed)
[name] 61 y.o. male Nutrition Note [name] is motivated to make lifestyle changes to aid with cardiac/pulmonary rehab. Patient has medical history of s/p CABG x1, s/p aortic valve replacement, HTN, CAD, OSA, hypothyroidism, DM2, liver transplant recipient. He lives alone and works part time in [employer]; he previously worked as a Dealer for many years. He does eat out frequently but he is trying to reduce red meat and fried foods.   Labs: A1c 7.4, triglycerides 397, Hdl 33.5, VLDL 79.4  24 hour Recall Breakfast: bran flakes with 2% milk, occasional- eggs, bacon or sausage Lunch: did not discuss Dinner: out to eat or fast food OR mexican-tacos  Beverages: water, diet pepsi  Nutrition Diagnosis Excessive carbohydrate intake related to consumption of convenience foods and sugary beverages as evidenced by A1C 7.4 and triglycerides 397 Excessive mineral intake (sodium) related to overconsumption of highly processed and restaurant foods as evidenced by diet recall and diagnosis of HTN  Nutrition Intervention Pt's individual nutrition plan reviewed with pt. Benefits of adopting Heart Healthy diet discussed.  Continue client-centered nutrition education by RD, as part of interdisciplinary care.  Monitor/Evaluation: Patient reports motivation to make lifestyle changes for adherence to heart healthy diet recommendation, blood sugar control, and weight management. We discussed reduction of saturated fat intake and fried foods and increasing fiber through vegetable intake. Patient does report eating out frequently resulting in high sodium food choices and a preference towards beef. Patient amicable to RD suggestions and verbalizes understanding. Will follow-up as needed.   10 minutes spent in review of topics related to a heart healthy diet including sodium intake, blood sugar control, weight management, and fiber intake.  Goal(s) Make 1/2 the plate vegetables at lunch and dinner; consider  side salads, frozen/steamer bags,etc Continue to reduce red meat, processed meats, fried foods,etc  to <1x/week. May implement chicken, fish, [place], eggs,etc Pt to identify and limit food sources of saturated fat, trans fat, refined carbohydrates and sodium Pt to identify food quantities necessary to achieve weight loss of 6-24 lb at graduation from cardiac rehab.  Pt able to name foods that affect blood glucose. Continue to limit simple sugars, refined carbohydrates, sugary beverages, etc.  Pt to describe the benefit of including lean protein/plant proteins, fruits, vegetables, whole grains, nuts/seeds, and low-fat dairy products in a heart healthy meal plan. Pt to practice mindful and intuitive eating exercises  Plan:  Will provide client-centered nutrition education as part of interdisciplinary care Monitor and evaluate progress toward nutrition goal with team.   Lihanna Biever [place], MS, RDN, LDN

## 2021-07-15 ENCOUNTER — Other Ambulatory Visit: Payer: Self-pay | Admitting: Family Medicine

## 2021-07-16 ENCOUNTER — Encounter (HOSPITAL_COMMUNITY)
Admission: RE | Admit: 2021-07-16 | Discharge: 2021-07-16 | Disposition: A | Discharge status: Home / Self Care | Payer: Medicare Other | Source: Ambulatory Visit | Attending: Internal Medicine | Admitting: Internal Medicine

## 2021-07-16 DIAGNOSIS — Z951 Presence of aortocoronary bypass graft: Secondary | ICD-10-CM | POA: Diagnosis not present

## 2021-07-16 DIAGNOSIS — Z952 Presence of prosthetic heart valve: Secondary | ICD-10-CM

## 2021-07-16 DIAGNOSIS — Z48812 Encounter for surgical aftercare following surgery on the circulatory system: Secondary | ICD-10-CM | POA: Diagnosis not present

## 2021-07-18 ENCOUNTER — Encounter (HOSPITAL_COMMUNITY)
Admission: RE | Admit: 2021-07-18 | Discharge: 2021-07-18 | Disposition: A | Discharge status: Home / Self Care | Payer: Medicare Other | Source: Ambulatory Visit | Attending: Internal Medicine | Admitting: Internal Medicine

## 2021-07-18 DIAGNOSIS — Z951 Presence of aortocoronary bypass graft: Secondary | ICD-10-CM | POA: Diagnosis not present

## 2021-07-18 DIAGNOSIS — Z952 Presence of prosthetic heart valve: Secondary | ICD-10-CM

## 2021-07-18 DIAGNOSIS — Z48812 Encounter for surgical aftercare following surgery on the circulatory system: Secondary | ICD-10-CM | POA: Diagnosis not present

## 2021-07-21 DIAGNOSIS — Z5181 Encounter for therapeutic drug level monitoring: Principal | ICD-10-CM

## 2021-07-21 DIAGNOSIS — Z944 Liver transplant status: Principal | ICD-10-CM

## 2021-07-21 DIAGNOSIS — E612 Magnesium deficiency: Principal | ICD-10-CM

## 2021-07-23 ENCOUNTER — Encounter (HOSPITAL_COMMUNITY)
Admission: RE | Admit: 2021-07-23 | Discharge: 2021-07-23 | Disposition: A | Discharge status: Home / Self Care | Payer: Medicare Other | Source: Ambulatory Visit | Attending: Internal Medicine | Admitting: Internal Medicine

## 2021-07-23 DIAGNOSIS — Z796 Long-term use of immunosuppressant medication: Principal | ICD-10-CM

## 2021-07-23 DIAGNOSIS — Z944 Liver transplant status: Principal | ICD-10-CM

## 2021-07-23 DIAGNOSIS — Z48812 Encounter for surgical aftercare following surgery on the circulatory system: Secondary | ICD-10-CM | POA: Diagnosis not present

## 2021-07-23 DIAGNOSIS — Z951 Presence of aortocoronary bypass graft: Secondary | ICD-10-CM

## 2021-07-23 DIAGNOSIS — Z952 Presence of prosthetic heart valve: Secondary | ICD-10-CM | POA: Diagnosis not present

## 2021-07-23 MED ORDER — MYCOPHENOLATE MOFETIL 250 MG CAPSULE
ORAL_CAPSULE | Freq: Two times a day (BID) | ORAL | 11 refills | 30 days | Status: CP
Start: 2021-07-23 — End: ?

## 2021-07-24 DIAGNOSIS — E612 Magnesium deficiency: Secondary | ICD-10-CM | POA: Diagnosis not present

## 2021-07-24 DIAGNOSIS — Z5181 Encounter for therapeutic drug level monitoring: Secondary | ICD-10-CM | POA: Diagnosis not present

## 2021-07-24 DIAGNOSIS — Z944 Liver transplant status: Secondary | ICD-10-CM | POA: Diagnosis not present

## 2021-07-24 NOTE — Progress Notes (Signed)
I am not sure why a capillary glucose was done

## 2021-07-25 ENCOUNTER — Encounter (HOSPITAL_COMMUNITY)
Admission: RE | Admit: 2021-07-25 | Discharge: 2021-07-25 | Disposition: A | Discharge status: Home / Self Care | Payer: Medicare Other | Source: Ambulatory Visit | Attending: Internal Medicine | Admitting: Internal Medicine

## 2021-07-25 DIAGNOSIS — Z952 Presence of prosthetic heart valve: Secondary | ICD-10-CM | POA: Diagnosis not present

## 2021-07-25 DIAGNOSIS — Z951 Presence of aortocoronary bypass graft: Secondary | ICD-10-CM | POA: Diagnosis not present

## 2021-07-25 LAB — CBC W/ DIFFERENTIAL
BANDED NEUTROPHILS ABSOLUTE COUNT: 0.1 10*3/uL (ref 0.0–0.1)
BASOPHILS ABSOLUTE COUNT: 0.1 10*3/uL (ref 0.0–0.2)
BASOPHILS RELATIVE PERCENT: 1 %
EOSINOPHILS ABSOLUTE COUNT: 0.4 10*3/uL (ref 0.0–0.4)
EOSINOPHILS RELATIVE PERCENT: 4 %
HEMATOCRIT: 45.8 % (ref 37.5–51.0)
HEMOGLOBIN: 15.3 g/dL (ref 13.0–17.7)
IMMATURE GRANULOCYTES: 1 %
LYMPHOCYTES ABSOLUTE COUNT: 2.2 10*3/uL (ref 0.7–3.1)
LYMPHOCYTES RELATIVE PERCENT: 24 %
MEAN CORPUSCULAR HEMOGLOBIN CONC: 33.4 g/dL (ref 31.5–35.7)
MEAN CORPUSCULAR HEMOGLOBIN: 28.3 pg (ref 26.6–33.0)
MEAN CORPUSCULAR VOLUME: 85 fL (ref 79–97)
MONOCYTES ABSOLUTE COUNT: 0.8 10*3/uL (ref 0.1–0.9)
MONOCYTES RELATIVE PERCENT: 9 %
NEUTROPHILS ABSOLUTE COUNT: 5.5 10*3/uL (ref 1.4–7.0)
NEUTROPHILS RELATIVE PERCENT: 61 %
PLATELET COUNT: 242 10*3/uL (ref 150–450)
RED BLOOD CELL COUNT: 5.41 x10E6/uL (ref 4.14–5.80)
RED CELL DISTRIBUTION WIDTH: 15 % (ref 11.6–15.4)
WHITE BLOOD CELL COUNT: 8.9 10*3/uL (ref 3.4–10.8)

## 2021-07-25 LAB — COMPREHENSIVE METABOLIC PANEL
A/G RATIO: 1.8 (ref 1.2–2.2)
ALBUMIN: 5.1 g/dL — ABNORMAL HIGH (ref 3.8–4.9)
ALKALINE PHOSPHATASE: 89 IU/L (ref 44–121)
ALT (SGPT): 53 IU/L — ABNORMAL HIGH (ref 0–44)
AST (SGOT): 32 IU/L (ref 0–40)
BILIRUBIN TOTAL (MG/DL) IN SER/PLAS: 0.3 mg/dL (ref 0.0–1.2)
BLOOD UREA NITROGEN: 27 mg/dL — ABNORMAL HIGH (ref 6–24)
BUN / CREAT RATIO: 23 — ABNORMAL HIGH (ref 9–20)
CALCIUM: 10.3 mg/dL — ABNORMAL HIGH (ref 8.7–10.2)
CHLORIDE: 95 mmol/L — ABNORMAL LOW (ref 96–106)
CO2: 16 mmol/L — ABNORMAL LOW (ref 20–29)
CREATININE: 1.15 mg/dL (ref 0.76–1.27)
GLOBULIN, TOTAL: 2.9 g/dL (ref 1.5–4.5)
GLUCOSE: 118 mg/dL — ABNORMAL HIGH (ref 70–99)
POTASSIUM: 4.8 mmol/L (ref 3.5–5.2)
SODIUM: 133 mmol/L — ABNORMAL LOW (ref 134–144)
TOTAL PROTEIN: 8 g/dL (ref 6.0–8.5)

## 2021-07-25 LAB — PHOSPHORUS: PHOSPHORUS, SERUM: 3.7 mg/dL (ref 2.8–4.1)

## 2021-07-25 LAB — GAMMA GT: GAMMA GLUTAMYL TRANSFERASE: 51 IU/L (ref 0–65)

## 2021-07-25 LAB — MAGNESIUM: MAGNESIUM: 1.9 mg/dL (ref 1.6–2.3)

## 2021-07-25 LAB — BILIRUBIN, DIRECT: BILIRUBIN DIRECT: <0.1 mg/dL (ref 0.00–0.40)

## 2021-07-26 LAB — TACROLIMUS LEVEL: TACROLIMUS BLOOD: 6 ng/mL (ref 2.0–20.0)

## 2021-07-28 ENCOUNTER — Encounter (HOSPITAL_COMMUNITY)
Admission: RE | Admit: 2021-07-28 | Discharge: 2021-07-28 | Disposition: A | Discharge status: Home / Self Care | Payer: Medicare Other | Source: Ambulatory Visit | Attending: Internal Medicine | Admitting: Internal Medicine

## 2021-07-28 DIAGNOSIS — E612 Magnesium deficiency: Principal | ICD-10-CM

## 2021-07-28 DIAGNOSIS — Z944 Liver transplant status: Principal | ICD-10-CM

## 2021-07-28 DIAGNOSIS — Z5181 Encounter for therapeutic drug level monitoring: Principal | ICD-10-CM

## 2021-07-28 DIAGNOSIS — Z952 Presence of prosthetic heart valve: Secondary | ICD-10-CM

## 2021-07-28 DIAGNOSIS — Z951 Presence of aortocoronary bypass graft: Secondary | ICD-10-CM

## 2021-07-29 DIAGNOSIS — Z796 Long-term use of immunosuppressant medication: Principal | ICD-10-CM

## 2021-07-29 DIAGNOSIS — Z944 Liver transplant status: Principal | ICD-10-CM

## 2021-07-29 MED ORDER — MYCOPHENOLATE MOFETIL 250 MG CAPSULE
ORAL_CAPSULE | Freq: Two times a day (BID) | ORAL | 11 refills | 30 days | Status: CP
Start: 2021-07-29 — End: ?
  Filled 2021-07-31: qty 60, 30d supply, fill #0

## 2021-07-29 NOTE — Progress Notes (Signed)
Cardiac Individual Treatment Plan  Patient Details  Name: ZADEN SAKO MRN: [id number] Date of Birth: Mar 01, 1960 Referring Provider:   Flowsheet Row CARDIAC REHAB PHASE II ORIENTATION from 07/03/2021 in [place]  Referring Provider Dorris Carnes, MD       Initial Encounter Date:  [place] PHASE II ORIENTATION from 07/03/2021 in [place]  Date 07/03/21       Visit Diagnosis: 04/03/21 S/P CABG x 1  04/03/21 S/P AVR (aortic valve replacement)  Patient's Home Medications on Admission:  Current Outpatient Medications:    acetaminophen (TYLENOL) 500 MG tablet, Take 500 mg by mouth every 6 (six) hours as needed for moderate pain or headache., Disp: , Rfl:    albuterol (VENTOLIN HFA) 108 (90 Base) MCG/ACT inhaler, Inhale 2 puffs into the lungs every 6 (six) hours as needed for wheezing or shortness of breath., Disp: , Rfl:    alendronate (FOSAMAX) 70 MG tablet, Take 1 tablet (70 mg total) by mouth every Sunday. Take with a full glass of water on an empty stomach., Disp: 13 tablet, Rfl: 3   aspirin EC 325 MG EC tablet, Take 1 tablet (325 mg total) by mouth daily., Disp: , Rfl:    azelastine (ASTELIN) 0.1 % nasal spray, PLACE 2 SPRAYS INTO BOTH NOSTRILS TWICE DAILY AS NEEDED, Disp: 30 mL, Rfl: 1   calcium carbonate (TUMS - DOSED IN MG ELEMENTAL CALCIUM) 500 MG chewable tablet, Chew 1 tablet by mouth daily., Disp: , Rfl:    Cholecalciferol (VITAMIN D) 50 MCG (2000 UT) tablet, Take 2,000 Units by mouth daily., Disp: , Rfl:    empagliflozin (JARDIANCE) 10 MG TABS tablet, Take 1 tablet (10 mg total) by mouth daily before breakfast., Disp: 30 tablet, Rfl: 5   ferrous [id number] C-folic acid (TRINSICON / FOLTRIN) capsule, Take 1 capsule by mouth 2 (two) times daily after a meal. (Patient not taking: Reported on 07/02/2021), Disp: 60 capsule, Rfl: 1   fluticasone (FLONASE) 50 MCG/ACT nasal spray, PLACE ONE  OR TWO SPRAYS INTO BOTH NOSTRILS DAILY AS NEEDED., Disp: 48 g, Rfl: 3   levothyroxine (SYNTHROID) 150 MCG tablet, Take 1 tablet (150 mcg total) by mouth daily before breakfast., Disp: , Rfl:    metFORMIN (GLUCOPHAGE-XR) 500 MG 24 hr tablet, Take 2 tablets (1,000 mg total) by mouth in the morning and at bedtime., Disp: 360 tablet, Rfl: 3   metoprolol tartrate (LOPRESSOR) 25 MG tablet, TAKE 1 TABLET BY MOUTH TWICE A DAY, Disp: 60 tablet, Rfl: 1   metroNIDAZOLE (METROGEL) 1 % gel, Apply 1 application topically daily as needed (rosacea)., Disp: , Rfl:    mycophenolate (CELLCEPT) 250 MG capsule, Take 250 mg by mouth 2 (two) times daily., Disp: , Rfl:    NON FORMULARY, Pt uses a cpap nightly, Disp: , Rfl:    OneTouch Delica Lancets 15V MISC, USE TO CHECK SUGAR DAILY, Disp: 100 each, Rfl: 3   ONETOUCH ULTRA test strip, USE TO CHECK SUGAR DAILY, Disp: 100 strip, Rfl: 0   pravastatin (PRAVACHOL) 40 MG tablet, Take 40 mg by mouth every evening., Disp: , Rfl:    sertraline (ZOLOFT) 50 MG tablet, Take 1 tablet (50 mg total) by mouth daily., Disp: 90 tablet, Rfl: 3   tacrolimus (PROGRAF) 1 MG capsule, Take 2 mg by mouth 2 (two) times daily., Disp: , Rfl:   Past Medical History: Past Medical History:  Diagnosis Date   Allergy    Asthma  Bicuspid aortic valve    sees dr Harrington Challenger   Cirrhosis [facility])    Coronary artery disease    Diabetes mellitus without complication ([place])    DM2 (diabetes mellitus, type 2) ([place])    Fatty liver    with h/o elevated LFT's   GERD (gastroesophageal reflux disease)    Heart murmur    Hypertension    Itching    all over last few months   Jaundice    Liver transplant recipient ([place])    09/16/2018 at [facility]   Migraine with aura    OSA (obstructive sleep apnea) 09/14/2011   PSG 11/08/11>>AHI 31.6, SpO2 low 85%. wears CPAP, pt does not know settings   Thyroid disease     Tobacco Use: Social History   Tobacco Use  Smoking Status Never   Passive exposure: Past  Smokeless  Tobacco Never    Labs: Review Flowsheet        Latest Ref Rng & Units 12/20/2020 03/17/2021 04/01/2021 04/03/2021  Labs for ITP Cardiac and Pulmonary Rehab  Hemoglobin A1c 4.8 - 5.6 %  8.2   7.4     PH, Arterial 7.350 - 7.450 7.326     7.335     7.330     7.264     7.344     7.317     7.333     7.383     7.342     7.345     7.356    PCO2 arterial 32.0 - 48.0 mmHg 41.9     43.8     44.4     54.9     46.1     43.0     46.4     40.6     42.8     43.3     38.8    Bicarbonate 20.0 - 28.0 mmol/L 25.0     21.9     23.3     23.5     25.0     25.1     22.0     24.6     24.2     23.2     22.6     23.6     21.2    TCO2 22 - 32 mmol/L '26     23     25     25     27     25     26     23     24     26     25     23     24     24     25     22     25    '$ Acid-base deficit 0.0 - 2.0 mmol/L 2.0     4.0     3.0     3.0     3.0     1.0     4.0     1.0     1.0     2.0     4.0     2.0     3.4    O2 Saturation % 72.0     98.0     99.0     99.0     98.0     100.0     100.0     100.0     100.0     100.0     70.0  100.0     97.9       04/04/2021  Labs for ITP Cardiac and Pulmonary Rehab  Hemoglobin A1c   PH, Arterial   PCO2 arterial   Bicarbonate   TCO2   Acid-base deficit   O2 Saturation 63.0        Multiple values from one day are sorted in reverse-chronological order        Capillary Blood Glucose: Lab Results  Component Value Date   GLUCAP 149 (H) 07/09/2021   GLUCAP 160 (H) 07/09/2021   GLUCAP 181 (H) 07/07/2021   GLUCAP 161 (H) 07/07/2021   GLUCAP 137 (H) 04/07/2021     Exercise Target Goals: Exercise Program Goal: Individual exercise prescription set using results from initial 6 min walk test and THRR while considering  patient's activity barriers and safety.   Exercise Prescription Goal: Starting with aerobic activity 30 plus minutes a day, 3 days per week for initial exercise prescription. Provide home exercise  prescription and guidelines that participant acknowledges understanding prior to discharge.  Activity Barriers & Risk Stratification:  Activity Barriers & Cardiac Risk Stratification - 07/03/21 1036       Activity Barriers & Cardiac Risk Stratification   Activity Barriers Arthritis;Neck/Spine Problems;Deconditioning;Shortness of Breath;Balance Concerns    Cardiac Risk Stratification High             6 Minute Walk:  6 Minute Walk     Row Name 07/03/21 1035         6 Minute Walk   Phase Initial     Distance 1536 feet     Walk Time 6 minutes     # of Rest Breaks 0     MPH 2.91     METS 3.71     RPE 11     Perceived Dyspnea  0     VO2 Peak 13     Symptoms Yes (comment)     Comments Lighthededness     Resting HR 67 bpm     Resting BP 142/84     Resting Oxygen Saturation  100 %     Exercise Oxygen Saturation  during 6 min walk 98 %     Max Ex. HR 76 bpm     Max Ex. BP 156/80     2 Minute Post BP 140/80              Oxygen Initial Assessment:   Oxygen Re-Evaluation:   Oxygen Discharge (Final Oxygen Re-Evaluation):   Initial Exercise Prescription:  Initial Exercise Prescription - 07/03/21 1000       Date of Initial Exercise RX and Referring Provider   Date 07/03/21    Referring Provider Dorris Carnes, MD    Expected Discharge Date 08/29/21      Recumbant Bike   Level 2    RPM 60    Minutes 15    METs 2.5      T5 Nustep   Level 2    SPM 80    Minutes 15    METs 2.5      Prescription Details   Frequency (times per week) 3    Duration Progress to 30 minutes of continuous aerobic without signs/symptoms of physical distress      Intensity   THRR 40-80% of Max Heartrate 65-129    Ratings of Perceived Exertion 11-13    Perceived Dyspnea 0-4      Progression   Progression Continue progressive overload as per policy without signs/symptoms or physical  distress.      Resistance Training   Training Prescription Yes    Weight 4 lbs    Reps 10-15              Perform Capillary Blood Glucose checks as needed.  Exercise Prescription Changes:   Exercise Prescription Changes     Row Name 07/07/21 0900 07/16/21 1000           Response to Exercise   Blood Pressure (Admit) 122/68 124/80      Blood Pressure (Exercise) 142/72 125/62      Blood Pressure (Exit) 130/80 122/78      Heart Rate (Admit) 67 bpm 64 bpm      Heart Rate (Exercise) 81 bpm 81 bpm      Heart Rate (Exit) 69 bpm 65 bpm      Rating of Perceived Exertion (Exercise) 13 12      Symptoms None None      Comments Pt's first day in the CRP2 program Reviewed METs      Duration Continue with 30 min of aerobic exercise without signs/symptoms of physical distress. Continue with 30 min of aerobic exercise without signs/symptoms of physical distress.      Intensity THRR unchanged THRR unchanged        Progression   Progression Continue to progress workloads to maintain intensity without signs/symptoms of physical distress. Continue to progress workloads to maintain intensity without signs/symptoms of physical distress.      Average METs 1.85 2.4        Resistance Training   Training Prescription Yes No      Weight 4 lbs No weights on Wednesdays      Reps 10-15 --      Time 10 Minutes --        Interval Training   Interval Training No No        Recumbant Bike   Level 2 3      Minutes 15 15      METs 1.5 2.5        T5 Nustep   Level 2 3      Minutes 15 15      METs 2.2 2.4               Exercise Comments:   Exercise Comments     Row Name 07/07/21 0956 07/16/21 1055         Exercise Comments Pt's first day in the CRP2 program. No complaints with todays session. Reviewed METs. Pt increased workloads on nustep and recumbent bike.               Exercise Goals and Review:   Exercise Goals     Row Name 07/03/21 1037             Exercise Goals   Increase Physical Activity Yes       Intervention Provide advice, education, support and  counseling about physical activity/exercise needs.;Develop an individualized exercise prescription for aerobic and resistive training based on initial evaluation findings, risk stratification, comorbidities and participant's personal goals.       Expected Outcomes Short Term: Attend rehab on a regular basis to increase amount of physical activity.;Long Term: Add in home exercise to make exercise part of routine and to increase amount of physical activity.;Long Term: Exercising regularly at least 3-5 days a week.       Increase Strength and Stamina Yes       Intervention Provide advice, education, support and  counseling about physical activity/exercise needs.;Develop an individualized exercise prescription for aerobic and resistive training based on initial evaluation findings, risk stratification, comorbidities and participant's personal goals.       Expected Outcomes Short Term: Increase workloads from initial exercise prescription for resistance, speed, and METs.;Short Term: Perform resistance training exercises routinely during rehab and add in resistance training at home;Long Term: Improve cardiorespiratory fitness, muscular endurance and strength as measured by increased METs and functional capacity (6MWT)       Able to understand and use rate of perceived exertion (RPE) scale Yes       Intervention Provide education and explanation on how to use RPE scale       Expected Outcomes Short Term: Able to use RPE daily in rehab to express subjective intensity level;Long Term:  Able to use RPE to guide intensity level when exercising independently       Knowledge and understanding of Target Heart Rate Range (THRR) Yes       Intervention Provide education and explanation of THRR including how the numbers were predicted and where they are located for reference       Expected Outcomes Short Term: Able to state/look up THRR;Short Term: Able to use daily as guideline for intensity in rehab;Long Term: Able to use  THRR to govern intensity when exercising independently       Understanding of Exercise Prescription Yes       Intervention Provide education, explanation, and written materials on patient's individual exercise prescription       Expected Outcomes Short Term: Able to explain program exercise prescription;Long Term: Able to explain home exercise prescription to exercise independently                Exercise Goals Re-Evaluation :  Exercise Goals Re-Evaluation     [place] Name 07/07/21 0954             Exercise Goal Re-Evaluation   Exercise Goals Review Increase Physical Activity;Increase Strength and Stamina;Able to understand and use rate of perceived exertion (RPE) scale;Knowledge and understanding of Target Heart Rate Range (THRR);Understanding of Exercise Prescription       Comments Pt's first day in the CRP2 program. Pt understnads the exercise Rx, RPE scale and THRR.       Expected Outcomes Will continue to monitor patient and progress exercise workloads as tolerated.                 Discharge Exercise Prescription (Final Exercise Prescription Changes):  Exercise Prescription Changes - 07/16/21 1000       Response to Exercise   Blood Pressure (Admit) 124/80    Blood Pressure (Exercise) 125/62    Blood Pressure (Exit) 122/78    Heart Rate (Admit) 64 bpm    Heart Rate (Exercise) 81 bpm    Heart Rate (Exit) 65 bpm    Rating of Perceived Exertion (Exercise) 12    Symptoms None    Comments Reviewed METs    Duration Continue with 30 min of aerobic exercise without signs/symptoms of physical distress.    Intensity THRR unchanged      Progression   Progression Continue to progress workloads to maintain intensity without signs/symptoms of physical distress.    Average METs 2.4      Resistance Training   Training Prescription No    Weight No weights on Wednesdays      Interval Training   Interval Training No      Recumbant Bike   Level 3  Minutes 15    METs 2.5       T5 Nustep   Level 3    Minutes 15    METs 2.4             Nutrition:  Target Goals: Understanding of nutrition guidelines, daily intake of sodium '1500mg'$ , cholesterol '200mg'$ , calories 30% from fat and 7% or less from saturated fats, daily to have 5 or more servings of fruits and vegetables.  Biometrics:  Pre Biometrics - 07/03/21 0900       Pre Biometrics   Waist Circumference 46 inches    Hip Circumference 39.5 inches    Waist to Hip Ratio 1.16 %    Triceps Skinfold 10 mm    % Body Fat 29.2 %    Grip Strength 44 kg    Flexibility 12.5 in    Single Leg Stand 30 seconds              Nutrition Therapy Plan and Nutrition Goals:  Nutrition Therapy & Goals - 07/14/21 0944       Nutrition Therapy   Diet Heart Healthy/ Carbohydrate Consistent    Drug/Food Interactions Statins/Certain Fruits      Personal Nutrition Goals   Nutrition Goal Patient to implement lean protein/plant protein, fruits, vegetables, whole grains, and low fat dairy as part of a heart healthy meal plan.    Personal Goal #2 Patient to limit food sources of saturated fat, trans fat, sodium and refined carbohydrates.    Personal Goal #3 Patient to reduce sodium intake to <2300 mg per day.      Intervention Plan   Intervention Prescribe, educate and counsel regarding individualized specific dietary modifications aiming towards targeted core components such as weight, hypertension, lipid management, diabetes, heart failure and other comorbidities.    Expected Outcomes Short Term Goal: Understand basic principles of dietary content, such as calories, fat, sodium, cholesterol and nutrients.;Short Term Goal: A plan has been developed with personal nutrition goals set during dietitian appointment.;Long Term Goal: Adherence to prescribed nutrition plan.             Nutrition Assessments:  Nutrition Assessments - 07/07/21 1208       Rate Your Plate Scores   Pre Score 59             MEDIFICTS Score Key: ?70 Need to make dietary changes  40-70 Heart Healthy Diet ? 40 Therapeutic Level Cholesterol Diet  Flowsheet Row CARDIAC REHAB PHASE II EXERCISE from 07/07/2021 in [place]  Picture Your Plate Total Score on Admission 59      Picture Your Plate Scores: <14 Unhealthy dietary pattern with much room for improvement. 41-50 Dietary pattern unlikely to meet recommendations for good health and room for improvement. 51-60 More healthful dietary pattern, with some room for improvement.  >60 Healthy dietary pattern, although there may be some specific behaviors that could be improved.    Nutrition Goals Re-Evaluation:   Nutrition Goals Discharge (Final Nutrition Goals Re-Evaluation):   Psychosocial: Target Goals: Acknowledge presence or absence of significant depression and/or stress, maximize coping skills, provide positive support system. Participant is able to verbalize types and ability to use techniques and skills needed for reducing stress and depression.  Initial Review & Psychosocial Screening:  Initial Psych Review & Screening - 07/03/21 1458       Initial Review   Current issues with Current Stress Concerns;Current Anxiety/Panic    Source of Stress Concerns Chronic Illness;Financial  Comments [place] has anxiety and is on medication      Family Dynamics   Good Support System? Yes   Keishon lives alon he has a daughter for support     Barriers   Psychosocial barriers to participate in program The patient should benefit from training in stress management and relaxation.      Screening Interventions   Interventions Encouraged to exercise    Expected Outcomes Long Term Goal: Stressors or current issues are controlled or eliminated.             Quality of Life Scores:  Quality of Life - 07/03/21 1032       Quality of Life   Select Quality of Life      Quality of Life Scores   Health/Function Pre 20.4 %     Socioeconomic Pre 21.81 %    Psych/Spiritual Pre 26.57 %    Family Pre 22.8 %    GLOBAL Pre 22.3 %            Scores of 19 and below usually indicate a poorer quality of life in these areas.  A difference of  2-3 points is a clinically meaningful difference.  A difference of 2-3 points in the total score of the Quality of Life Index has been associated with significant improvement in overall quality of life, self-image, physical symptoms, and general health in studies assessing change in quality of life.  PHQ-9: Review Flowsheet        07/03/2021 11/18/2020 01/19/2017  Depression screen PHQ 2/9  Decreased Interest 0 0 0  Down, Depressed, Hopeless 0 0 0  PHQ - 2 Score 0 0 0  Altered sleeping  0   Tired, decreased energy  0   Change in appetite  0   Feeling bad or failure about yourself   0   Trouble concentrating  0   Moving slowly or fidgety/restless  0   Suicidal thoughts  0   PHQ-9 Score  0   Difficult doing work/chores  Not difficult at all          Interpretation of Total Score  Total Score Depression Severity:  1-4 = Minimal depression, 5-9 = Mild depression, 10-14 = Moderate depression, 15-19 = Moderately severe depression, 20-27 = Severe depression   Psychosocial Evaluation and Intervention:   Psychosocial Re-Evaluation:  Psychosocial Re-Evaluation     Row Name 07/08/21 0758 07/29/21 0801           Psychosocial Re-Evaluation   Current issues with Current Stress Concerns;Current Anxiety/Panic Current Stress Concerns;Current Anxiety/Panic      Comments Codey started exercise at cardiac rehab on 07/07/21. [place] did well with exercise. Vital signs and CBG's were stable Hanzel has not voiced any increased concerns or stressors during exercise at cardiac rehab.      Expected Outcomes Leotis Pain will have decreased anxiety and stress concerns upon completion of phase 2 cardiac rehab. Leotis Pain will have decreased anxiety and stress concerns upon completion of phase 2  cardiac rehab.      Interventions Encouraged to attend Cardiac Rehabilitation for the exercise Encouraged to attend Cardiac Rehabilitation for the exercise      Continue Psychosocial Services  Follow up required by staff Follow up required by staff        Initial Review   Source of Stress Concerns Chronic Illness;Financial Chronic Illness;Financial      Comments Will continue to monitor and offer support as needed Will continue to monitor and offer support as  needed               Psychosocial Discharge (Final Psychosocial Re-Evaluation):  Psychosocial Re-Evaluation - 07/29/21 0801       Psychosocial Re-Evaluation   Current issues with Current Stress Concerns;Current Anxiety/Panic    Comments [place] has not voiced any increased concerns or stressors during exercise at cardiac rehab.    Expected Outcomes Leotis Pain will have decreased anxiety and stress concerns upon completion of phase 2 cardiac rehab.    Interventions Encouraged to attend Cardiac Rehabilitation for the exercise    Continue Psychosocial Services  Follow up required by staff      Initial Review   Source of Stress Concerns Chronic Illness;Financial    Comments Will continue to monitor and offer support as needed             Vocational Rehabilitation: Provide vocational rehab assistance to qualifying candidates.   Vocational Rehab Evaluation & Intervention:  Vocational Rehab - 07/03/21 1501       Initial Vocational Rehab Evaluation & Intervention   Assessment shows need for Vocational Rehabilitation No   Dejohn is disabled and does not need vocational rehab at this time            Education: Education Goals: Education classes will be provided on a weekly basis, covering required topics. Participant will state understanding/return demonstration of topics presented.  Learning Barriers/Preferences:  Learning Barriers/Preferences - 07/03/21 1034       Learning Barriers/Preferences   Learning Barriers  Sight   wears glasses   Learning Preferences Computer/Internet;Individual Instruction;Group Instruction;Pictoral;Skilled Demonstration;Video             Education Topics: Hypertension, Hypertension Reduction -Define heart disease and high blood pressure. Discus how high blood pressure affects the body and ways to reduce high blood pressure.   Exercise and Your Heart -Discuss why it is important to exercise, the FITT principles of exercise, normal and abnormal responses to exercise, and how to exercise safely.   Angina -Discuss definition of angina, causes of angina, treatment of angina, and how to decrease risk of having angina.   Cardiac Medications -Review what the following cardiac medications are used for, how they affect the body, and side effects that may occur when taking the medications.  Medications include Aspirin, Beta blockers, calcium channel blockers, ACE Inhibitors, angiotensin receptor blockers, diuretics, digoxin, and antihyperlipidemics.   Congestive Heart Failure -Discuss the definition of CHF, how to live with CHF, the signs and symptoms of CHF, and how keep track of weight and sodium intake.   Heart Disease and Intimacy -Discus the effect sexual activity has on the heart, how changes occur during intimacy as we age, and safety during sexual activity.   Smoking Cessation / COPD -Discuss different methods to quit smoking, the health benefits of quitting smoking, and the definition of COPD.   Nutrition I: Fats -Discuss the types of cholesterol, what cholesterol does to the heart, and how cholesterol levels can be controlled.   Nutrition II: Labels -Discuss the different components of food labels and how to read food label   Heart Parts/Heart Disease and PAD -Discuss the anatomy of the heart, the pathway of blood circulation through the heart, and these are affected by heart disease.   Stress I: Signs and Symptoms -Discuss the causes of stress, how  stress may lead to anxiety and depression, and ways to limit stress.   Stress II: Relaxation -Discuss different types of relaxation techniques to limit stress.   Warning Signs  of Stroke / TIA -Discuss definition of a stroke, what the signs and symptoms are of a stroke, and how to identify when someone is having stroke.   Knowledge Questionnaire Score:  Knowledge Questionnaire Score - 07/03/21 1033       Knowledge Questionnaire Score   Pre Score 19/24             Core Components/Risk Factors/Patient Goals at Admission:  Personal Goals and Risk Factors at Admission - 07/03/21 1033       Core Components/Risk Factors/Patient Goals on Admission    Weight Management Yes;Obesity;Weight Loss    Intervention Weight Management: Develop a combined nutrition and exercise program designed to reach desired caloric intake, while maintaining appropriate intake of nutrient and fiber, sodium and fats, and appropriate energy expenditure required for the weight goal.;Weight Management: Provide education and appropriate resources to help participant work on and attain dietary goals.;Weight Management/Obesity: Establish reasonable short term and long term weight goals.;Obesity: Provide education and appropriate resources to help participant work on and attain dietary goals.    Admit Weight 203 lb 14.8 oz (92.5 kg)    Expected Outcomes Short Term: Continue to assess and modify interventions until short term weight is achieved;Long Term: Adherence to nutrition and physical activity/exercise program aimed toward attainment of established weight goal;Weight Maintenance: Understanding of the daily nutrition guidelines, which includes 25-35% calories from fat, 7% or less cal from saturated fats, less than '200mg'$  cholesterol, less than 1.5gm of sodium, & 5 or more servings of fruits and vegetables daily;Weight Loss: Understanding of general recommendations for a balanced deficit meal plan, which promotes 1-2 lb  weight loss per week and includes a negative energy balance of [phone] kcal/d;Understanding recommendations for meals to include 15-35% energy as protein, 25-35% energy from fat, 35-60% energy from carbohydrates, less than '200mg'$  of dietary cholesterol, 20-35 gm of total fiber daily;Understanding of distribution of calorie intake throughout the day with the consumption of 4-5 meals/snacks    Diabetes Yes    Intervention Provide education about signs/symptoms and action to take for hypo/hyperglycemia.;Provide education about proper nutrition, including hydration, and aerobic/resistive exercise prescription along with prescribed medications to achieve blood glucose in normal ranges: Fasting glucose 65-99 mg/dL    Expected Outcomes Short Term: Participant verbalizes understanding of the signs/symptoms and immediate care of hyper/hypoglycemia, proper foot care and importance of medication, aerobic/resistive exercise and nutrition plan for blood glucose control.;Long Term: Attainment of HbA1C < 7%.    Hypertension Yes    Intervention Provide education on lifestyle modifcations including regular physical activity/exercise, weight management, moderate sodium restriction and increased consumption of fresh fruit, vegetables, and low fat dairy, alcohol moderation, and smoking cessation.;Monitor prescription use compliance.    Expected Outcomes Short Term: Continued assessment and intervention until BP is < 140/61m HG in hypertensive participants. < 130/872mHG in hypertensive participants with diabetes, heart failure or chronic kidney disease.;Long Term: Maintenance of blood pressure at goal levels.    Lipids Yes    Intervention Provide education and support for participant on nutrition & aerobic/resistive exercise along with prescribed medications to achieve LDL '70mg'$ , HDL >'40mg'$ .    Expected Outcomes Short Term: Participant states understanding of desired cholesterol values and is compliant with medications  prescribed. Participant is following exercise prescription and nutrition guidelines.;Long Term: Cholesterol controlled with medications as prescribed, with individualized exercise RX and with personalized nutrition plan. Value goals: LDL < '70mg'$ , HDL > 40 mg.    Stress Yes    Intervention Offer individual and/or small  group education and counseling on adjustment to heart disease, stress management and health-related lifestyle change. Teach and support self-help strategies.;Refer participants experiencing significant psychosocial distress to appropriate mental health specialists for further evaluation and treatment. When possible, include family members and significant others in education/counseling sessions.    Expected Outcomes Short Term: Participant demonstrates changes in health-related behavior, relaxation and other stress management skills, ability to obtain effective social support, and compliance with psychotropic medications if prescribed.;Long Term: Emotional wellbeing is indicated by absence of clinically significant psychosocial distress or social isolation.             Core Components/Risk Factors/Patient Goals Review:   Goals and Risk Factor Review     Row Name 07/29/21 0803             Core Components/Risk Factors/Patient Goals Review   Personal Goals Review Weight Management/Obesity;Stress;Hypertension;Lipids;Diabetes       Review Guenther has been  doing well with exercise, vital signs and CBG's have been stable.       Expected Outcomes Amareon will continue to participate in phase 2 cardiac rehab for exercise, nutrition and lifestyle modifications                Core Components/Risk Factors/Patient Goals at Discharge (Final Review):   Goals and Risk Factor Review - 07/29/21 0803       Core Components/Risk Factors/Patient Goals Review   Personal Goals Review Weight Management/Obesity;Stress;Hypertension;Lipids;Diabetes    Review Cottrell has been  doing well with  exercise, vital signs and CBG's have been stable.    Expected Outcomes Gatsby will continue to participate in phase 2 cardiac rehab for exercise, nutrition and lifestyle modifications             ITP Comments:  ITP Comments     Row Name 07/03/21 1230 07/08/21 0756 07/29/21 0800       ITP Comments Dr Fransico Him MD, Medical Director 30 Day ITP Review. Rashied started cardiac rehab on 07/08/21 and did well with exercise. 30 Day ITP Review. Efrain has good attendance and participation in phase 2 cardiac rehab.              Comments: See ITP comments.Harrell Gave RN BSN

## 2021-07-30 ENCOUNTER — Encounter (HOSPITAL_COMMUNITY)
Admission: RE | Admit: 2021-07-30 | Discharge: 2021-07-30 | Disposition: A | Discharge status: Home / Self Care | Payer: Medicare Other | Source: Ambulatory Visit | Attending: Internal Medicine | Admitting: Internal Medicine

## 2021-07-30 DIAGNOSIS — Z951 Presence of aortocoronary bypass graft: Secondary | ICD-10-CM | POA: Diagnosis not present

## 2021-07-30 DIAGNOSIS — Z952 Presence of prosthetic heart valve: Secondary | ICD-10-CM

## 2021-07-30 NOTE — Unmapped (Signed)
[facility] Clinical Assessment & Refill Coordination Note    Anthony Alvarado, [place]: 1960-12-18  Phone: [phone] (home)     All above HIPAA information was verified with patient.     Was a Nurse, learning disability used for this call? No    Specialty Medication(s):   Mycophenolate 250mg   Tacrolimus 1mg   cellcept 250mg  from mfg     Current Outpatient Medications   Medication Sig Dispense Refill   ??? alendronate (FOSAMAX) 70 MG tablet Take 1 tablet (70 mg total) by mouth every seven (7) days. 4 tablet 2   ??? alendronate (FOSAMAX) 70 MG tablet Take 1 tablet (70 mg total) by mouth Every Sunday. Take with a full glass of water on an empty stomach. 13 tablet 3   ??? aspirin 325 MG tablet Take 325 mg by mouth daily.     ??? azelastine (ASTELIN) 137 mcg (0.1 %) nasal spray 1 spray by Each Nare route daily as needed.      ??? blood sugar diagnostic (ONETOUCH ULTRA TEST) Strp USE TO CHECK SUGAR DAILY     ??? blood sugar diagnostic Strp Use as directed Three (3) times a day before meals. 90 each 11   ??? calcium carbonate (TUMS) 200 mg calcium (500 mg) chewable tablet Chew 1 tablet daily.     ??? clindamycin (CLEOCIN T) 1 % lotion Apply topically to face for acne daily 60 mL 2   ??? clobetasoL (TEMOVATE) 0.05 % ointment Apply topically Two (2) times a day to itchy spots 60 g 1   ??? FEROCON 110-0.5 mg capsule TAKE 1 CAPSULE BY MOUTH 2 (TWO) TIMES DAILY AFTER A MEAL. NOT COVERED BY INSURANCE.     ??? fluticasone propionate (FLONASE) 50 mcg/actuation nasal spray PLACE ONE OR TWO SPRAYS INTO BOTH NOSTRILS DAILY AS NEEDED.     ??? JARDIANCE 10 mg tablet TAKE 1 TABLET BY MOUTH DAILY BEFORE BREAKFAST.     ??? lancets 33 gauge Misc 1 each by Miscellaneous route Three (3) times a day before meals. 100 each 11   ??? levothyroxine (SYNTHROID) 150 MCG tablet Take 1 tablet (150 mcg total) by mouth daily. 30 tablet 11   ??? metFORMIN (GLUCOPHAGE-XR) 500 MG 24 hr tablet Take 2 tablets (1,000 mg total) by mouth in the morning and at bedtime. 360 tablet 3   ??? metoprolol tartrate (LOPRESSOR) 25 MG tablet Take 25 mg by mouth Two (2) times a day.     ??? mycophenolate (CELLCEPT) 250 mg capsule Take 1 capsule (250 mg total) by mouth Two (2) times a day. 60 capsule 11   ??? pravastatin (PRAVACHOL) 40 MG tablet Take 1 tablet (40 mg total) by mouth every evening. 90 tablet 3   ??? sertraline (ZOLOFT) 50 MG tablet Take 1 tablet (50 mg total) by mouth daily. 90 tablet 3   ??? tacrolimus (PROGRAF) 1 MG capsule Take 2 capsules (2 mg total) by mouth two (2) times a day. 120 capsule 11   ??? testosterone (ANDROGEL) 1 % (25 mg/2.5gram) GlPk APPLY 2.5G (1 PACKET) TO EACH SHOULDER/UPPER ARM DAILY FOR A TOTAL DAILY DOSE OF 5G 150 packet 0     No current facility-administered medications for this visit.     Facility-Administered Medications Ordered in Other Visits   Medication Dose Route Frequency Provider Last Rate Last Admin   ??? diazePAM (VALIUM) 5 mg/mL injection                 Changes to medications: [name]  reports no changes at this time.    Allergies   Allergen Reactions   ??? Watermelon Flavor      Mouth itching       Changes to allergies: No    SPECIALTY MEDICATION ADHERENCE     Tacrolimus 1mg   : 20 days of medicine on hand   Mycophenolate 250mg   : 0 days of medicine on hand   cellcept 250mg   : 7 days of medicine on hand from mfg       Medication Adherence    Patient reported X missed doses in the last month: 0  Specialty Medication: tacrolimus 1mg   Patient is on additional specialty medications: Yes  Additional Specialty Medications: Cellcept 250mg   Patient Reported Additional Medication X Missed Doses in the Last Month: 0  Patient is on more than two specialty medications: Yes  Specialty Medication: mycophenolate 250mg   Patient Reported Additional Medication X Missed Doses in the Last Month: 0          Specialty medication(s) dose(s) confirmed: Regimen is correct and unchanged.     Are there any concerns with adherence? No    Adherence counseling provided? Not needed    CLINICAL MANAGEMENT AND INTERVENTION      Clinical Benefit Assessment:    Do you feel the medicine is effective or helping your condition? Yes    Clinical Benefit counseling provided? Not needed    Adverse Effects Assessment:    Are you experiencing any side effects? No    Are you experiencing difficulty administering your medicine? No    Quality of Life Assessment:         How many days over the past month did your transplant  keep you from your normal activities? For example, brushing your teeth or getting up in the morning. 0    Have you discussed this with your provider? Not needed    Acute Infection Status:    Acute infections noted within Epic:  No active infections  Patient reported infection: None    Therapy Appropriateness:    Is therapy appropriate and patient progressing towards therapeutic goals? Yes, therapy is appropriate and should be continued    DISEASE/MEDICATION-SPECIFIC INFORMATION      N/A    PATIENT SPECIFIC NEEDS     - Does the patient have any physical, cognitive, or cultural barriers? No    - Is the patient high risk? Yes, patient is taking a REMS drug. Medication is dispensed in compliance with REMS program    - Does the patient require a Care Management Plan? No     SOCIAL DETERMINANTS OF HEALTH     At the [facility], we have learned that life circumstances - like trouble affording food, housing, utilities, or transportation can affect the health of many of our patients.   That is why we wanted to ask: are you currently experiencing any life circumstances that are negatively impacting your health and/or quality of life? No    Social Determinants of Psychologist, prison and probation services Strain: Not on file   Internet Connectivity: Not on file   Food Insecurity: Not on file   Tobacco Use: Low Risk    ??? Smoking Tobacco Use: Never   ??? Smokeless Tobacco Use: Never   ??? Passive Exposure: Not on file   Housing/Utilities: Not on file   Alcohol Use: Not on file   Transportation Needs: Not on file   Substance Use: Not on file   Health Literacy: Not  on file   Physical Activity: Not on file   Interpersonal Safety: Not on file   Stress: Not on file   Intimate Partner Violence: Not on file   Depression: Not on file   Social Connections: Not on file       Would you be willing to receive help with any of the needs that you have identified today? Not applicable       SHIPPING     Specialty Medication(s) to be Shipped:   Mycophenolate 250mg     Other medication(s) to be shipped: No additional medications requested for fill at this time   Call set up for 1 week out for all other meds per patient request     Changes to insurance: No    Delivery Scheduled: Yes, Expected medication delivery date: 08/01/2021.     Medication will be delivered via UPS to the confirmed prescription address in [facility].    The patient will receive a drug information handout for each medication shipped and additional FDA Medication Guides as required.  Verified that patient has previously received a Conservation officer, historic buildings and a Surveyor, mining.    The patient or caregiver noted above participated in the development of this care plan and knows that they can request review of or adjustments to the care plan at any time.      All of the patient's questions and concerns have been addressed.    [name]   [facility] Specialty Pharmacist

## 2021-07-30 NOTE — Unmapped (Signed)
The following medication is re-onboarded in this note:  1. Mycophenolate $6.79 / 30 days    [facility]   Patient Onboarding/Medication Counseling    Anthony Alvarado is a 61 y.o. male with liver transplant who I am counseling today on continuation of therapy.  I am speaking to the patient.    Was a Nurse, learning disability used for this call? No    Verified patient's date of birth / HIPAA.    Specialty medication(s) to be sent: na- see delivery information on clinical note from today      Non-specialty medications/supplies to be sent: na      Medications not needed at this time: na         The patient declined counseling on medication administration, missed dose instructions, goals of therapy, side effects and monitoring parameters, warnings and precautions, drug/food interactions and storage, handling precautions, and disposal because they have taken the medication previously. The information in the declined sections below are for informational purposes only and was not discussed with patient.   Cellcept (mycopheonlate mofetil)    Medication & Administration     Dosage: Take 1 capsules (250mg  total) by mouth twice daily    Administration:   ??? Take by mouth with or without food.   ??? Taking with food can minimize GI side effects.   ??? Swallow capsules whole, do not crush or chew.  ??? Oral suspension should be shaken well prior to administration.  Do not mix with other medications and discard any unused portion 60 days after constitution.      Adherence/Missed dose instructions:  Take a missed dose as soon as you remember it . If it is close to the time of your next dose, skip the missed dose and resume your normal schedule.Never take 2 doses to try and catch up from a missed dose.    Goals of Therapy     Prevent organ rejection    Side Effects & Monitoring Parameters     ??? Feeling tired or weak  ??? Shakiness  ??? Trouble sleeping  ??? Diarrhea, abdominal pain, nausea, vomiting, constipation or decreased appetite  ??? Decreases in blood counts   ??? Back or joint pain  ??? Hypertension or hypotension  ??? High blood sugar  ??? Headache  ??? Skin rash    The following side effects should be reported to the provider:  ??? Reduced immune function - report signs of infection such as fever; chills; body aches; very bad sore throat; ear or sinus pain; cough; more sputum or change in color of sputum; pain with passing urine; wound that will not heal, etc.  Also at a slightly higher risk of some malignancies (mainly skin and blood cancers) due to this reduced immune function.  ??? Allergic reaction (rash, hives, swelling, shortness of breath)  ??? High blood sugar (confusion, feeling sleepy, more thirst, more hungry, passing urine more often, flushing, fast breathing, or breath that smells like fruit)  ??? Electrolyte issues (mood changes, confusion, muscle pain or weakness, a heartbeat that does not feel normal, seizures, not hungry, or very bad upset stomach or throwing up)  ??? High or low blood pressure (bad headache or dizziness, passing out, or change in eyesight)  ??? Kidney issues (unable to pass urine, change in how much urine is passed, blood in the urine, or a big weight gain)  ??? Skin (oozing, heat, swelling, redness, or pain), UTI and other infections   ??? Chest pain or pressure  ???  Abnormal heartbeat  ??? Unexplained bleeding or bruising  ??? Abnormal burning, numbness, or tingling  ??? Muscle cramps,  ??? Yellowing of skin or eyes    Monitoring parameters  ??? Pregnancy   ??? CBC   ??? Renal and hepatic function    Contraindications, Warnings, & Precautions     ??? *This is a REMS drug and an FDA-approved patient medication guide will be printed with each dispensation  ??? Black Box Warning: Infections   ??? Black Box Warning: Lymphoproliferative disorders - risk of development of lymphoma and skin malignancy is increased  ??? Black Box Warning: Use during pregnancy is associated with increased risks of first trimester pregnancy loss and congenital malformations.   ??? Black Box Warning: Females of reproductive potential should use contraception during treatment and for 6 weeks after therapy is discontinued  ??? Is patient using an effective method of contraception? Not Applicable  ??? If yes, method of contraception: patient is male  ??? CNS depression  ??? New or reactivated viral infections  ??? Neutropenia  ??? Male patients and/or their male partners should use effective contraception during treatment of the male patient and for at least 3 months after last dose.  ??? Breastfeeding is not recommended during therapy and for 6 weeks after last dose    Drug/Food Interactions     ??? Medication list reviewed in Epic. The patient was instructed to inform the care team before taking any new medications or supplements. no interactions noted that clinic is not already monitoring.   ??? Separate doses of antacids and this medication  ??? Check with your doctor before getting any vaccinations    Storage, Handling Precautions, & Disposal     ??? Store at room temperature in a dry place  ??? This medication is considered hazardous. Wash hands after handling and store out of reach or others, including children and pets.        Current Medications (including OTC/herbals), Comorbidities and Allergies     Current Outpatient Medications   Medication Sig Dispense Refill   ??? alendronate (FOSAMAX) 70 MG tablet Take 1 tablet (70 mg total) by mouth every seven (7) days. 4 tablet 2   ??? alendronate (FOSAMAX) 70 MG tablet Take 1 tablet (70 mg total) by mouth Every Sunday. Take with a full glass of water on an empty stomach. 13 tablet 3   ??? aspirin 325 MG tablet Take 325 mg by mouth daily.     ??? azelastine (ASTELIN) 137 mcg (0.1 %) nasal spray 1 spray by Each Nare route daily as needed.      ??? blood sugar diagnostic (ONETOUCH ULTRA TEST) Strp USE TO CHECK SUGAR DAILY     ??? blood sugar diagnostic Strp Use as directed Three (3) times a day before meals. 90 each 11   ??? calcium carbonate (TUMS) 200 mg calcium (500 mg) chewable tablet Chew 1 tablet daily.     ??? clindamycin (CLEOCIN T) 1 % lotion Apply topically to face for acne daily 60 mL 2   ??? clobetasoL (TEMOVATE) 0.05 % ointment Apply topically Two (2) times a day to itchy spots 60 g 1   ??? FEROCON 110-0.5 mg capsule TAKE 1 CAPSULE BY MOUTH 2 (TWO) TIMES DAILY AFTER A MEAL. NOT COVERED BY INSURANCE.     ??? fluticasone propionate (FLONASE) 50 mcg/actuation nasal spray PLACE ONE OR TWO SPRAYS INTO BOTH NOSTRILS DAILY AS NEEDED.     ??? JARDIANCE 10 mg tablet TAKE 1 TABLET BY MOUTH  DAILY BEFORE BREAKFAST.     ??? lancets 33 gauge Misc 1 each by Miscellaneous route Three (3) times a day before meals. 100 each 11   ??? levothyroxine (SYNTHROID) 150 MCG tablet Take 1 tablet (150 mcg total) by mouth daily. 30 tablet 11   ??? metFORMIN (GLUCOPHAGE-XR) 500 MG 24 hr tablet Take 2 tablets (1,000 mg total) by mouth in the morning and at bedtime. 360 tablet 3   ??? metoprolol tartrate (LOPRESSOR) 25 MG tablet Take 25 mg by mouth Two (2) times a day.     ??? mycophenolate (CELLCEPT) 250 mg capsule Take 1 capsule (250 mg total) by mouth Two (2) times a day. 60 capsule 11   ??? pravastatin (PRAVACHOL) 40 MG tablet Take 1 tablet (40 mg total) by mouth every evening. 90 tablet 3   ??? sertraline (ZOLOFT) 50 MG tablet Take 1 tablet (50 mg total) by mouth daily. 90 tablet 3   ??? tacrolimus (PROGRAF) 1 MG capsule Take 2 capsules (2 mg total) by mouth two (2) times a day. 120 capsule 11   ??? testosterone (ANDROGEL) 1 % (25 mg/2.5gram) GlPk APPLY 2.5G (1 PACKET) TO EACH SHOULDER/UPPER ARM DAILY FOR A TOTAL DAILY DOSE OF 5G 150 packet 0     No current facility-administered medications for this visit.     Facility-Administered Medications Ordered in Other Visits   Medication Dose Route Frequency Provider Last Rate Last Admin   ??? diazePAM (VALIUM) 5 mg/mL injection                Allergies   Allergen Reactions   ??? Watermelon Flavor      Mouth itching       Patient Active Problem List   Diagnosis   ??? Diabetes mellitus without complication (CMS-HCC)   ??? Hypothyroidism   ??? Cirrhosis of liver without ascites (CMS-HCC)   ??? OSA (obstructive sleep apnea)   ??? Essential hypertension   ??? Encounter for pre-transplant evaluation for chronic liver disease   ??? Pleural effusion   ??? Pneumothorax after biopsy   ??? Liver transplant recipient (CMS-HCC)   ??? Allergic rhinitis   ??? Arthralgia   ??? Asthma   ??? Bicuspid aortic valve   ??? Flying phobia   ??? Mucosal abnormality of stomach   ??? Nephrolithiasis   ??? Pure hypercholesterolemia   ??? Umbilical hernia   ??? Unspecified hearing loss   ??? COVID-19       Reviewed and up to date in Epic.    Appropriateness of Therapy     Acute infections noted within Epic:  No active infections  Patient reported infection: None    Is medication and dose appropriate based on diagnosis and infection status? Yes    Prescription has been clinically reviewed: Yes      Baseline Quality of Life Assessment      How many days over the past month did your transplant  keep you from your normal activities? For example, brushing your teeth or getting up in the morning. 0    Financial Information     Medication Assistance provided: None Required    Anticipated copay of $$6.79 / 30 days reviewed with patient. Verified delivery address.    Delivery Information     Scheduled delivery date: see delivery info in clinical note from today    Expected start date: pt is currently already taking    Medication will be delivered via na to the na address in [place].  This shipment  will not require a signature.      Explained the services we provide at [facility] and that each month we would call to set up refills.  Stressed importance of returning phone calls so that we could ensure they receive their medications in time each month.  Informed patient that we should be setting up refills 7-10 days prior to when they will run out of medication.  A pharmacist will reach out to perform a clinical assessment periodically.  Informed patient that a welcome packet, containing information about our pharmacy and other support services, a Notice of Privacy Practices, and a drug information handout will be sent.      The patient or caregiver noted above participated in the development of this care plan and knows that they can request review of or adjustments to the care plan at any time.      Patient or caregiver verbalized understanding of the above information as well as how to contact the pharmacy at [phone] option 4 with any questions/concerns.  The pharmacy is open Monday through Friday 8:30am-4:30pm.  A pharmacist is available 24/7 via pager to answer any clinical questions they may have.    Patient Specific Needs     - Does the patient have any physical, cognitive, or cultural barriers? No    - Does the patient have adequate living arrangements? (i.e. the ability to store and take their medication appropriately) Yes    - Did you identify any home environmental safety or security hazards? No    - Patient prefers to have medications discussed with  Patient     - Is the patient or caregiver able to read and understand education materials at a high school level or above? Yes    - Patient's primary language is  English     - Is the patient high risk? Yes, patient is taking a REMS drug. Medication is dispensed in compliance with REMS program    SOCIAL DETERMINANTS OF HEALTH     At the [facility], we have learned that life circumstances - like trouble affording food, housing, utilities, or transportation can affect the health of many of our patients.   That is why we wanted to ask: are you currently experiencing any life circumstances that are negatively impacting your health and/or quality of life? No    Social Determinants of Psychologist, prison and probation services Strain: Not on file   Internet Connectivity: Not on file   Food Insecurity: Not on file   Tobacco Use: Low Risk    ??? Smoking Tobacco Use: Never   ??? Smokeless Tobacco Use: Never   ??? Passive Exposure: Not on file   Housing/Utilities: Not on file   Alcohol Use: Not on file   Transportation Needs: Not on file   Substance Use: Not on file   Health Literacy: Not on file   Physical Activity: Not on file   Interpersonal Safety: Not on file   Stress: Not on file   Intimate Partner Violence: Not on file   Depression: Not on file   Social Connections: Not on file       Would you be willing to receive help with any of the needs that you have identified today? Not applicable       [name]  [facility] Specialty Pharmacist

## 2021-07-30 NOTE — Unmapped (Signed)
Patient messaged [facility] via Mychart stating that his pharmacy needed a note of medical necessity to refill his cellcept. Sent script to [facility] with request for mfr assistance investigation. Per pharmacy, since copy is just over $6, he does not qualify, but they can fill his Rx from [facility]. Notified patient, who was already aware and had been contacted by [facility] for shipment tomorrow.

## 2021-07-30 NOTE — Unmapped (Signed)
[facility] Specialty Medication Onboarding    Specialty Medication: Mycophenolate 250mg  capsule  Prior Authorization: Not Required   Financial Assistance: No - copay  <$25  Final Copay/Day Supply: $6.79 / 30 days    Insurance Restrictions: None     Notes to Pharmacist: Re-Onboarding. LF 06/2020    The triage team has completed the benefits investigation and has determined that the patient is able to fill this medication at [facility]. Please contact the patient to complete the onboarding or follow up with the prescribing physician as needed.

## 2021-08-01 ENCOUNTER — Encounter (HOSPITAL_COMMUNITY)
Admission: RE | Admit: 2021-08-01 | Discharge: 2021-08-01 | Disposition: A | Discharge status: Home / Self Care | Payer: Medicare Other | Source: Ambulatory Visit | Attending: Internal Medicine | Admitting: Internal Medicine

## 2021-08-01 DIAGNOSIS — Z951 Presence of aortocoronary bypass graft: Secondary | ICD-10-CM | POA: Diagnosis not present

## 2021-08-01 DIAGNOSIS — Z952 Presence of prosthetic heart valve: Secondary | ICD-10-CM

## 2021-08-04 ENCOUNTER — Encounter (HOSPITAL_COMMUNITY)
Admission: RE | Admit: 2021-08-04 | Discharge: 2021-08-04 | Disposition: A | Discharge status: Home / Self Care | Payer: Medicare Other | Source: Ambulatory Visit | Attending: Internal Medicine | Admitting: Internal Medicine

## 2021-08-04 DIAGNOSIS — Z5181 Encounter for therapeutic drug level monitoring: Principal | ICD-10-CM

## 2021-08-04 DIAGNOSIS — Z944 Liver transplant status: Principal | ICD-10-CM

## 2021-08-04 DIAGNOSIS — E612 Magnesium deficiency: Principal | ICD-10-CM

## 2021-08-04 DIAGNOSIS — Z952 Presence of prosthetic heart valve: Secondary | ICD-10-CM

## 2021-08-04 DIAGNOSIS — Z951 Presence of aortocoronary bypass graft: Secondary | ICD-10-CM | POA: Diagnosis not present

## 2021-08-05 NOTE — Progress Notes (Signed)
Cardiology Office Note   Date:  08/12/2021   ID:  [name] 12-25-1960, MRN [id number]  PCP:  [name], MD  Cardiologist:   Dorris Carnes, MD   F/U of aortic stenosis.     History of Present Illness: [name] is a 61 y.o. male with a history of aortic stenosis, cirrhosis, HTN.[name]   As pretransplant evaluation in 2019 L heart cath  done   [facility] showed 40% D1, 30 to 40% D2; 40 to 50% OM2   LVEF normal   Mild AS     Echo in July 2020 showed Peak and mean gradients through the AV were 71 and 37 mm Hg   AVA 1.3cm2.  A limited echo in Nov 2020 showed no effusion. he was seen in Tx clinic at [place] up for spirometry which he had in Aug 2021    FEV1 was 2.59 L   FEF 25 to 75  1.71 L/s    I saw the pt in 2022.  AN echo in October 2022 showed LVEF normal   Mod LVH   Mean grad 31 mm I  DVI 0.22.  CTA showed multiple multilple nonobstructie PE   Started on Eliquis e underwent CABG x 1(LIMA to LAD) and AVR with Inspiriris Resilia pericardial valve on 04/03/21   Seen by Gerrianne Scale on march 2023  Pt says he is doing better with the rehab   But if goes up hill get dizzy, light headed   [place]     Overall says his breathing is about the same as before surgery   Disappointed that he did not get a better response  Thought is breathing would be better .   Pt also notes since surgery he has had headaches that involve whole head     Breakfast:   Bran flakes or eggs/sausage    Lunch:   Fast food  Home Dinner  Veggies Beans   Meat Diet pepsi and water  Rare sweet     Current Meds  Medication Sig   acetaminophen (TYLENOL) 500 MG tablet Take 500 mg by mouth every 6 (six) hours as needed for moderate pain or headache.   albuterol (VENTOLIN HFA) 108 (90 Base) MCG/ACT inhaler Inhale 2 puffs into the lungs every 6 (six) hours as needed for wheezing or shortness of breath.   alendronate (FOSAMAX) 70 MG tablet Take 1 tablet (70 mg total) by mouth every Sunday. Take with a full glass of water on  an empty stomach.   aspirin EC 325 MG EC tablet Take 1 tablet (325 mg total) by mouth daily.   azelastine (ASTELIN) 0.1 % nasal spray PLACE 2 SPRAYS INTO BOTH NOSTRILS TWICE DAILY AS NEEDED   calcium carbonate (TUMS - DOSED IN MG ELEMENTAL CALCIUM) 500 MG chewable tablet Chew 1 tablet by mouth daily.   Cholecalciferol (VITAMIN D) 50 MCG (2000 UT) tablet Take 2,000 Units by mouth daily.   ferrous [id number] C-folic acid (TRINSICON / FOLTRIN) capsule Take 1 capsule by mouth 2 (two) times daily after a meal.   fluticasone (FLONASE) 50 MCG/ACT nasal spray PLACE ONE OR TWO SPRAYS INTO BOTH NOSTRILS DAILY AS NEEDED.   levothyroxine (SYNTHROID) 150 MCG tablet Take 1 tablet (150 mcg total) by mouth daily before breakfast.   metFORMIN (GLUCOPHAGE-XR) 500 MG 24 hr tablet Take 2 tablets (1,000 mg total) by mouth in the morning and at bedtime.   metoprolol tartrate (LOPRESSOR) 25 MG tablet TAKE 1  TABLET BY MOUTH TWICE A DAY   metroNIDAZOLE (METROGEL) 1 % gel Apply 1 application topically daily as needed (rosacea).   mycophenolate (CELLCEPT) 250 MG capsule Take 250 mg by mouth 2 (two) times daily.   NON FORMULARY Pt uses a cpap nightly   OneTouch Delica Lancets 62B MISC USE TO CHECK SUGAR DAILY   ONETOUCH ULTRA test strip USE TO CHECK SUGAR DAILY   pravastatin (PRAVACHOL) 40 MG tablet Take 40 mg by mouth every evening.   sertraline (ZOLOFT) 50 MG tablet Take 1 tablet (50 mg total) by mouth daily.   tacrolimus (PROGRAF) 1 MG capsule Take 2 mg by mouth 2 (two) times daily.     Allergies:   Watermelon flavor   Past Medical History:  Diagnosis Date   Allergy    Asthma    Bicuspid aortic valve    sees dr Harrington Challenger   Cirrhosis [facility])    Coronary artery disease    Diabetes mellitus without complication ([place])    DM2 (diabetes mellitus, type 2) ([place])    Fatty liver    with h/o elevated LFT's   GERD (gastroesophageal reflux disease)    Heart murmur    Hypertension    Itching    all over last few  months   Jaundice    Liver transplant recipient ([place])    09/16/2018 at [facility]   Migraine with aura    OSA (obstructive sleep apnea) 09/14/2011   PSG 11/08/11>>AHI 31.6, SpO2 low 85%. wears CPAP, pt does not know settings   Thyroid disease     Past Surgical History:  Procedure Laterality Date   AORTIC VALVE REPLACEMENT N/A 04/03/2021   Procedure: AORTIC VALVE REPLACEMENT (AVR) USING 27 MM INSPIRIS RESILIA  AORTIC VALVE;  Surgeon: Gaye Pollack, MD;  Location: [place];  Service: Open Heart Surgery;  Laterality: N/A;   APPENDECTOMY  11/2007   Emergency   BIOPSY THYROID  08/19/2007   Attempted, no tissue obtained   CARDIAC CATHETERIZATION  03/21/2018   CARDIOVASCULAR STRESS TEST  04/2005   Negative 06/05   CHOLECYSTECTOMY     CORONARY ARTERY BYPASS GRAFT N/A 04/03/2021   Procedure: CORONARY ARTERY BYPASS GRAFTING (CABG) x ONE ON CARDIOPULMONARY BYPASS. LIMA TO LAD;  Surgeon: Gaye Pollack, MD;  Location: [facility] OR;  Service: Open Heart Surgery;  Laterality: N/A;   DOPPLER ECHOCARDIOGRAPHY  07/2002   ESOPHAGEAL BANDING N/A 05/04/2016   Procedure: ESOPHAGEAL BANDING;  Surgeon: [name], MD;  Location: [facility];  Service: Endoscopy;  Laterality: N/A;   ESOPHAGOGASTRODUODENOSCOPY (EGD) WITH PROPOFOL N/A 05/04/2016   Procedure: ESOPHAGOGASTRODUODENOSCOPY (EGD) WITH PROPOFOL;  Surgeon: [name], MD;  Location: [facility];  Service: Endoscopy;  Laterality: N/A;   LIVER TRANSPLANT     09/16/2018 at [facility]   RIGHT/LEFT HEART CATH AND CORONARY ANGIOGRAPHY N/A 12/20/2020   Procedure: RIGHT/LEFT HEART CATH AND CORONARY ANGIOGRAPHY;  Surgeon: [name], MD;  Location: [place] [facility];  Service: Cardiovascular;  Laterality: N/A;   TEE WITHOUT CARDIOVERSION N/A 04/03/2021   Procedure: TRANSESOPHAGEAL ECHOCARDIOGRAM (TEE);  Surgeon: Gaye Pollack, MD;  Location: [place];  Service: Open Heart Surgery;  Laterality: N/A;     Social History:  The patient  reports that he has never  smoked. He has been exposed to tobacco smoke. He has never used smokeless tobacco. He reports that he does not drink alcohol and does not use drugs.   Family History:  The patient's family history includes Asthma in his  mother; COPD in his father; Cancer in his father; Colon cancer in his maternal grandmother; Heart disease in his maternal grandfather and sister; Liver disease in his sister; Prostate cancer in his maternal uncle.    ROS:  Please see the history of present illness. All other systems are reviewed and  Negative to the above problem except as noted.    PHYSICAL EXAM: VS:  BP (!) 146/86   Pulse 66   Ht 5' 9.25" (1.759 m)   Wt 206 lb 12.8 oz (93.8 kg)   SpO2 98%   BMI 30.32 kg/m   GEN: Obese 61 yo in no acute distress  Neck: JVP not elevated   No carotid bruits Cardiac: RRR  Gr I/VI systolic murmur base   No LE  edema  Respiratory:  clear to auscultation bilaterally,  GI: soft, nontender, sl distended, + BS  No hepatomegaly  MS: no deformity Moving all extremities   Skin: warm and dry, no rash  Neuro:  Strength and sensation are intact Psych: euthymic mood, full affect   EKG:  EKG is  ordered today.    SR 76 bpm  LVH.  Nonspecific ST changes    Echo   05/15/21 Left ventricular ejection fraction, by estimation, is 55 to 60%. The left ventricle has normal function. The left ventricle has no regional wall motion abnormalities. There is moderate concentric left ventricular hypertrophy. Left ventricular diastolic parameters are consistent with Grade II diastolic dysfunction (pseudonormalization). Elevated left atrial pressure. The average left ventricular global longitudinal strain is -16.3 %. The global longitudinal strain is abnormal. 1. Right ventricular systolic function is normal. The right ventricular size is normal. Tricuspid regurgitation signal is inadequate for assessing PA pressure. 2. 3. Left atrial size was mildly dilated. 4. The mitral valve is normal in  structure. No evidence of mitral valve regurgitation. The aortic valve has been repaired/replaced. Aortic valve regurgitation is not visualized. There is a 27 mm Inspiris Resilia bioprosthetic valve present in the aortic position. Procedure Date: 04/03/21. Echo findings are consistent with normal structure and function of the aortic valve prosthesis. Aortic valve mean gradient measures 6.0 mmHg. Aortic valve Vmax measures 1.66 m/s. 5. Aortic dilatation noted. There is borderline dilatation of the aortic root, measuring 38 mm. There is mild dilatation of the ascending aorta, measuring 41 mm.  LHeart cath   12/20/20   Prox RCA lesion is 20% stenosed.   Mid RCA to Dist RCA lesion is 20% stenosed.   RPDA lesion is 20% stenosed.   Mid LAD lesion is 80% stenosed.   Severe mid LAD stenosis Mild non-obstructive disease in the RCA and Circumflex Severe aortic stenosis (mean gradient 50.6 mmHg, peak to peak gradient 66 mmHg, AVA 0.78 cm2).    Recommendations: He has a severe mid LAD stenosis (not critical) and severe AS with bicuspid aortic valve. Ideally he would be treated with single vessel CABG (LIMA to LAD) and surgical AVR given his young age and given the pathology associated with bicuspid aortic valves. Given his prior liver transplantation, he will be higher risk for CABG/AVR. If he is not felt to be a good candidate for surgery, will plan PCI of the LAD followed by TAVR. Will review with the structural heart team and plan for him to see Dr. Cyndia Bent in the CT surgery office following his CT scans.   Lipid Panel    Component Value Date/Time   CHOL 122 08/06/2021 0852   TRIG 446 (H) 08/06/2021 0852   HDL  31 (L) 08/06/2021 0852   CHOLHDL 3.9 08/06/2021 0852   CHOLHDL 3 11/18/2020 0958   VLDL 79.4 (H) 11/18/2020 0958   LDLCALC 29 08/06/2021 0852   LDLDIRECT 35.0 11/18/2020 0958      Wt Readings from Last 3 Encounters:  08/06/21 206 lb 12.8 oz (93.8 kg)  07/03/21 203 lb 14.8 oz (92.5 kg)   04/30/21 198 lb (89.8 kg)      ASSESSMENT AND PLAN:  1  Aortic stenosis  Pt s/p AVR   Echo in March looks good   Valve functioning well .    Follow  2  CAD   Pt s/p CABG x 1 with LIMA to LAD for 80% stenosis    He still has SOB   I am not convinced it represents angina     3  Dyspnea   Volume status appears OK   Valve functioning OK   .  Moving air well on exam.   Question if dyspnea related to Tx    [facility]  4  HTN  BP is mildly increased   Follow for now      5. LIpids    LDL excellent in 2022   Will repeat panel  6  Will Hx liver transplant  Follows at [facility] transplant clinc    Has appt soon        Current medicines are reviewed at length with the patient today.  The patient does not have concerns regarding medicines.  Signed, Dorris Carnes, MD  08/12/2021 9:12 PM    [facility] [address], [place], [place]  [address] Phone: [phone]; Fax: [phone]

## 2021-08-06 ENCOUNTER — Ambulatory Visit: Payer: Medicare Other | Admitting: Internal Medicine

## 2021-08-06 ENCOUNTER — Encounter: Payer: Self-pay | Admitting: Internal Medicine

## 2021-08-06 ENCOUNTER — Telehealth: Payer: Self-pay

## 2021-08-06 ENCOUNTER — Encounter (HOSPITAL_COMMUNITY): Payer: Medicare Other

## 2021-08-06 VITALS — BP 146/86 | HR 66 | Ht 69.25 in | Wt 206.8 lb

## 2021-08-06 DIAGNOSIS — I350 Nonrheumatic aortic (valve) stenosis: Secondary | ICD-10-CM

## 2021-08-06 DIAGNOSIS — E119 Type 2 diabetes mellitus without complications: Secondary | ICD-10-CM | POA: Diagnosis not present

## 2021-08-06 DIAGNOSIS — E785 Hyperlipidemia, unspecified: Secondary | ICD-10-CM

## 2021-08-06 DIAGNOSIS — I10 Essential (primary) hypertension: Secondary | ICD-10-CM | POA: Diagnosis not present

## 2021-08-06 DIAGNOSIS — E109 Type 1 diabetes mellitus without complications: Secondary | ICD-10-CM | POA: Diagnosis not present

## 2021-08-06 DIAGNOSIS — Z79899 Other long term (current) drug therapy: Secondary | ICD-10-CM

## 2021-08-06 LAB — COMPREHENSIVE METABOLIC PANEL
ALT: 60 IU/L — ABNORMAL HIGH (ref 0–44)
AST: 37 IU/L (ref 0–40)
Albumin/Globulin Ratio: 1.7 (ref 1.2–2.2)
Albumin: 4.8 g/dL (ref 3.8–4.9)
Alkaline Phosphatase: 86 IU/L (ref 44–121)
BUN/Creatinine Ratio: 16 (ref 9–20)
BUN: 19 mg/dL (ref 6–24)
Bilirubin Total: 0.3 mg/dL (ref 0.0–1.2)
CO2: 23 mmol/L (ref 20–29)
Calcium: 9.4 mg/dL (ref 8.7–10.2)
Chloride: 101 mmol/L (ref 96–106)
Creatinine, Ser: 1.18 mg/dL (ref 0.76–1.27)
Globulin, Total: 2.8 g/dL (ref 1.5–4.5)
Glucose: 140 mg/dL — ABNORMAL HIGH (ref 70–99)
Potassium: 5.3 mmol/L — ABNORMAL HIGH (ref 3.5–5.2)
Sodium: 137 mmol/L (ref 134–144)
Total Protein: 7.6 g/dL (ref 6.0–8.5)
eGFR: 71 mL/min/{1.73_m2} (ref >59)

## 2021-08-06 LAB — CBC
Hematocrit: 45.9 % (ref 37.5–51.0)
Hemoglobin: 15.7 g/dL (ref 13.0–17.7)
MCH: 28.9 pg (ref 26.6–33.0)
MCHC: 34.2 g/dL (ref 31.5–35.7)
MCV: 85 fL (ref 79–97)
Platelets: 243 10*3/uL (ref 150–450)
RBC: 5.43 x10E6/uL (ref 4.14–5.80)
RDW: 15.5 % — ABNORMAL HIGH (ref 11.6–15.4)
WBC: 9.2 10*3/uL (ref 3.4–10.8)

## 2021-08-06 LAB — LIPID PANEL
Chol/HDL Ratio: 3.9 ratio (ref 0.0–5.0)
Cholesterol, Total: 122 mg/dL (ref 100–199)
HDL: 31 mg/dL — ABNORMAL LOW (ref >39)
LDL Chol Calc (NIH): 29 mg/dL (ref 0–99)
Triglycerides: 446 mg/dL — ABNORMAL HIGH (ref 0–149)
VLDL Cholesterol Cal: 62 mg/dL — ABNORMAL HIGH (ref 5–40)

## 2021-08-06 LAB — TSH: TSH: 6.51 u[IU]/mL — ABNORMAL HIGH (ref 0.450–4.500)

## 2021-08-06 NOTE — Patient Instructions (Addendum)
Medication Instructions:   *If you need a refill on your cardiac medications before your next appointment, please call your pharmacy*   Lab Work: CBC, CMET, LIPID, TSH, HGBA1C  If you have labs (blood work) drawn today and your tests are completely normal, you will receive your results only by: [place] (if you have MyChart) OR A paper copy in the mail If you have any lab test that is abnormal or we need to change your treatment, we will call you to review the results.   Testing/Procedures:    Follow-Up: At [facility], you and your health needs are our priority.  As part of our continuing mission to provide you with exceptional heart care, we have created designated Provider Care Teams.  These Care Teams include your primary Cardiologist (physician) and Advanced Practice Providers (APPs -  Physician Assistants and Nurse Practitioners) who all work together to provide you with the care you need, when you need it.  We recommend signing up for the patient portal called "MyChart".  Sign up information is provided on this After Visit Summary.  MyChart is used to connect with patients for Virtual Visits (Telemedicine).  Patients are able to view lab/test results, encounter notes, upcoming appointments, etc.  Non-urgent messages can be sent to your provider as well.   To learn more about what you can do with MyChart, go to [email or web address].    Your next appointment:   1 year(s)  The format for your next appointment:   In Person  Provider:   Dorris Carnes, MD     Other Instructions   Important Information About Sugar

## 2021-08-06 NOTE — Telephone Encounter (Signed)
Lab add on request faxed to [facility] for the pt to also have a HGBA1C with today's labs.

## 2021-08-06 NOTE — Unmapped (Signed)
[facility] Refill Coordination Note    Specialty Medication(s) to be Shipped:   Transplant: tacrolimus 1mg     Other medication(s) to be shipped:  alendronate      Anthony Alvarado, DOB: October 06, 1960  Phone: [phone] (home)       All above HIPAA information was verified with patient.     Was a Nurse, learning disability used for this call? No    Completed refill call assessment today to schedule patient's medication shipment from the [facility] [phone]).  All relevant notes have been reviewed.     Specialty medication(s) and dose(s) confirmed: Regimen is correct and unchanged.   Changes to medications: [name] reports no changes at this time.  Changes to insurance: No  New side effects reported not previously addressed with a pharmacist or physician: None reported  Questions for the pharmacist: No    Confirmed patient received a Conservation officer, historic buildings and a Surveyor, mining with first shipment. The patient will receive a drug information handout for each medication shipped and additional FDA Medication Guides as required.       DISEASE/MEDICATION-SPECIFIC INFORMATION        N/A    SPECIALTY MEDICATION ADHERENCE     Medication Adherence    Patient reported X missed doses in the last month: 0  Specialty Medication: tacrolimus 1 mg  Patient is on additional specialty medications: No              Were doses missed due to medication being on hold? No    tacrolimus 1 mg: 7 days of medicine on hand       REFERRAL TO PHARMACIST     Referral to the pharmacist: Not needed      [facility]     Shipping address confirmed in Epic.     Delivery Scheduled: Yes, Expected medication delivery date: 08/08/21.     Medication will be delivered via UPS to the prescription address in Epic WAM.    [name]   [facility] Specialty Technician

## 2021-08-07 DIAGNOSIS — G4733 Obstructive sleep apnea (adult) (pediatric): Secondary | ICD-10-CM | POA: Diagnosis not present

## 2021-08-07 LAB — HGB A1C W/O EAG: Hgb A1c MFr Bld: 7.6 % — ABNORMAL HIGH (ref 4.8–5.6)

## 2021-08-07 LAB — SPECIMEN STATUS REPORT

## 2021-08-07 MED FILL — TACROLIMUS 1 MG CAPSULE, IMMEDIATE-RELEASE: ORAL | 30 days supply | Qty: 120 | Fill #1

## 2021-08-07 MED FILL — ALENDRONATE 70 MG TABLET: ORAL | 84 days supply | Qty: 12 | Fill #1

## 2021-08-08 ENCOUNTER — Encounter (HOSPITAL_COMMUNITY)
Admission: RE | Admit: 2021-08-08 | Discharge: 2021-08-08 | Disposition: A | Discharge status: Home / Self Care | Payer: Medicare Other | Source: Ambulatory Visit | Attending: Internal Medicine | Admitting: Internal Medicine

## 2021-08-08 DIAGNOSIS — Z951 Presence of aortocoronary bypass graft: Secondary | ICD-10-CM | POA: Diagnosis not present

## 2021-08-08 DIAGNOSIS — Z952 Presence of prosthetic heart valve: Secondary | ICD-10-CM | POA: Diagnosis not present

## 2021-08-08 NOTE — Progress Notes (Signed)
Reviewed home exercise Rx with patient today.  Encouraged warm-up, cool-down, and stretching. Reviewed THRR of 65-129 and keeping RPE between 11-13. Encouraged to hydrate with activity.  Reviewed weather parameters for temperature and humidity for safe exercise outdoors. Reviewed S/S to terminate exercise and when to call 911 vs MD. Reviewed the use of NTG and pt was encouraged to carry at all times. Pt encouraged to always carry a cell phone for safety when exercising outdoors. Encouraged to know blood sugar prior to exercise and carry a rapid acting glucose snack. Pt verbalized understanding of the home exercise Rx and was provided a copy.   Lesly Rubenstein MS, ACSM-CEP, CCRP

## 2021-08-10 ENCOUNTER — Other Ambulatory Visit: Payer: Self-pay | Admitting: Family Medicine

## 2021-08-11 ENCOUNTER — Encounter (HOSPITAL_COMMUNITY)
Admission: RE | Admit: 2021-08-11 | Discharge: 2021-08-11 | Disposition: A | Discharge status: Home / Self Care | Payer: Medicare Other | Source: Ambulatory Visit | Attending: Internal Medicine | Admitting: Internal Medicine

## 2021-08-11 DIAGNOSIS — Z5181 Encounter for therapeutic drug level monitoring: Principal | ICD-10-CM

## 2021-08-11 DIAGNOSIS — E612 Magnesium deficiency: Principal | ICD-10-CM

## 2021-08-11 DIAGNOSIS — Z944 Liver transplant status: Principal | ICD-10-CM

## 2021-08-11 DIAGNOSIS — Z952 Presence of prosthetic heart valve: Secondary | ICD-10-CM

## 2021-08-11 DIAGNOSIS — Z951 Presence of aortocoronary bypass graft: Secondary | ICD-10-CM | POA: Diagnosis not present

## 2021-08-13 ENCOUNTER — Encounter (HOSPITAL_COMMUNITY)
Admission: RE | Admit: 2021-08-13 | Discharge: 2021-08-13 | Disposition: A | Discharge status: Home / Self Care | Payer: Medicare Other | Source: Ambulatory Visit | Attending: Internal Medicine | Admitting: Internal Medicine

## 2021-08-13 DIAGNOSIS — Z952 Presence of prosthetic heart valve: Secondary | ICD-10-CM | POA: Diagnosis not present

## 2021-08-13 DIAGNOSIS — Z951 Presence of aortocoronary bypass graft: Secondary | ICD-10-CM | POA: Diagnosis not present

## 2021-08-15 ENCOUNTER — Encounter (HOSPITAL_COMMUNITY)
Admission: RE | Admit: 2021-08-15 | Discharge: 2021-08-15 | Disposition: A | Discharge status: Home / Self Care | Payer: Medicare Other | Source: Ambulatory Visit | Attending: Internal Medicine | Admitting: Internal Medicine

## 2021-08-15 DIAGNOSIS — Z952 Presence of prosthetic heart valve: Secondary | ICD-10-CM | POA: Diagnosis not present

## 2021-08-15 DIAGNOSIS — Z951 Presence of aortocoronary bypass graft: Secondary | ICD-10-CM | POA: Diagnosis not present

## 2021-08-18 ENCOUNTER — Encounter (HOSPITAL_COMMUNITY)
Admission: RE | Admit: 2021-08-18 | Discharge: 2021-08-18 | Disposition: A | Discharge status: Home / Self Care | Payer: Medicare Other | Source: Ambulatory Visit | Attending: Internal Medicine | Admitting: Internal Medicine

## 2021-08-18 DIAGNOSIS — Z944 Liver transplant status: Principal | ICD-10-CM

## 2021-08-18 DIAGNOSIS — Z5181 Encounter for therapeutic drug level monitoring: Principal | ICD-10-CM

## 2021-08-18 DIAGNOSIS — E612 Magnesium deficiency: Principal | ICD-10-CM

## 2021-08-18 DIAGNOSIS — C220 Liver cell carcinoma: Principal | ICD-10-CM

## 2021-08-18 DIAGNOSIS — Z951 Presence of aortocoronary bypass graft: Secondary | ICD-10-CM | POA: Diagnosis not present

## 2021-08-18 DIAGNOSIS — Z952 Presence of prosthetic heart valve: Secondary | ICD-10-CM | POA: Diagnosis not present

## 2021-08-20 ENCOUNTER — Encounter (HOSPITAL_COMMUNITY)
Admission: RE | Admit: 2021-08-20 | Discharge: 2021-08-20 | Disposition: A | Discharge status: Home / Self Care | Payer: Medicare Other | Source: Ambulatory Visit | Attending: Internal Medicine | Admitting: Internal Medicine

## 2021-08-20 DIAGNOSIS — Z952 Presence of prosthetic heart valve: Secondary | ICD-10-CM | POA: Diagnosis not present

## 2021-08-20 DIAGNOSIS — Z951 Presence of aortocoronary bypass graft: Secondary | ICD-10-CM

## 2021-08-20 NOTE — Progress Notes (Signed)
Cardiac Individual Treatment Plan  Patient Details  Name: Justin WOLLMAN MRN: [id number] Date of Birth: 27-Sep-1960 Referring Provider:   Flowsheet Row CARDIAC REHAB PHASE II ORIENTATION from 07/03/2021 in [place]  Referring Provider Dorris Carnes, MD       Initial Encounter Date:  [place] PHASE II ORIENTATION from 07/03/2021 in [place]  Date 07/03/21       Visit Diagnosis: 04/03/21 S/P CABG x 1  04/03/21 S/P AVR (aortic valve replacement)  Patient's Home Medications on Admission:  Current Outpatient Medications:    acetaminophen (TYLENOL) 500 MG tablet, Take 500 mg by mouth every 6 (six) hours as needed for moderate pain or headache., Disp: , Rfl:    albuterol (VENTOLIN HFA) 108 (90 Base) MCG/ACT inhaler, Inhale 2 puffs into the lungs every 6 (six) hours as needed for wheezing or shortness of breath., Disp: , Rfl:    alendronate (FOSAMAX) 70 MG tablet, Take 1 tablet (70 mg total) by mouth every Sunday. Take with a full glass of water on an empty stomach., Disp: 13 tablet, Rfl: 3   aspirin EC 325 MG EC tablet, Take 1 tablet (325 mg total) by mouth daily., Disp: , Rfl:    azelastine (ASTELIN) 0.1 % nasal spray, PLACE 2 SPRAYS INTO BOTH NOSTRILS TWICE DAILY AS NEEDED, Disp: 30 mL, Rfl: 1   calcium carbonate (TUMS - DOSED IN MG ELEMENTAL CALCIUM) 500 MG chewable tablet, Chew 1 tablet by mouth daily., Disp: , Rfl:    Cholecalciferol (VITAMIN D) 50 MCG (2000 UT) tablet, Take 2,000 Units by mouth daily., Disp: , Rfl:    empagliflozin (JARDIANCE) 10 MG TABS tablet, Take 1 tablet (10 mg total) by mouth daily before breakfast. (Patient not taking: Reported on 08/06/2021), Disp: 30 tablet, Rfl: 5   ferrous [id number] C-folic acid (TRINSICON / FOLTRIN) capsule, Take 1 capsule by mouth 2 (two) times daily after a meal., Disp: 60 capsule, Rfl: 1   fluticasone (FLONASE) 50 MCG/ACT nasal spray, PLACE ONE  OR TWO SPRAYS INTO BOTH NOSTRILS DAILY AS NEEDED., Disp: 48 g, Rfl: 3   levothyroxine (SYNTHROID) 150 MCG tablet, Take 1 tablet (150 mcg total) by mouth daily before breakfast., Disp: , Rfl:    metFORMIN (GLUCOPHAGE-XR) 500 MG 24 hr tablet, Take 2 tablets (1,000 mg total) by mouth in the morning and at bedtime., Disp: 360 tablet, Rfl: 3   metoprolol tartrate (LOPRESSOR) 25 MG tablet, TAKE 1 TABLET BY MOUTH TWICE A DAY, Disp: 60 tablet, Rfl: 1   metroNIDAZOLE (METROGEL) 1 % gel, Apply 1 application topically daily as needed (rosacea)., Disp: , Rfl:    mycophenolate (CELLCEPT) 250 MG capsule, Take 250 mg by mouth 2 (two) times daily., Disp: , Rfl:    NON FORMULARY, Pt uses a cpap nightly, Disp: , Rfl:    OneTouch Delica Lancets 73U MISC, USE TO CHECK SUGAR DAILY, Disp: 100 each, Rfl: 3   ONETOUCH ULTRA test strip, USE TO CHECK SUGAR DAILY, Disp: 100 strip, Rfl: 0   pravastatin (PRAVACHOL) 40 MG tablet, Take 40 mg by mouth every evening., Disp: , Rfl:    sertraline (ZOLOFT) 50 MG tablet, Take 1 tablet (50 mg total) by mouth daily., Disp: 90 tablet, Rfl: 3   tacrolimus (PROGRAF) 1 MG capsule, Take 2 mg by mouth 2 (two) times daily., Disp: , Rfl:   Past Medical History: Past Medical History:  Diagnosis Date   Allergy    Asthma  Bicuspid aortic valve    sees dr Harrington Challenger   Cirrhosis [facility])    Coronary artery disease    Diabetes mellitus without complication ([place])    DM2 (diabetes mellitus, type 2) ([place])    Fatty liver    with h/o elevated LFT's   GERD (gastroesophageal reflux disease)    Heart murmur    Hypertension    Itching    all over last few months   Jaundice    Liver transplant recipient ([place])    09/16/2018 at [facility]   Migraine with aura    OSA (obstructive sleep apnea) 09/14/2011   PSG 11/08/11>>AHI 31.6, SpO2 low 85%. wears CPAP, pt does not know settings   Thyroid disease     Tobacco Use: Social History   Tobacco Use  Smoking Status Never   Passive exposure: Past  Smokeless  Tobacco Never    Labs: Review Flowsheet  More data exists      Latest Ref Rng & Units 03/17/2021 04/01/2021 04/03/2021 04/04/2021  Labs for ITP Cardiac and Pulmonary Rehab  Cholestrol 100 - 199 mg/dL - - - -  LDL (calc) 0 - 99 mg/dL - - - -  HDL-C >39 mg/dL - - - -  Trlycerides 0 - 149 mg/dL - - - -  Hemoglobin A1c 4.8 - 5.6 % 8.2  7.4  - -  PH, Arterial 7.350 - 7.450 - - 7.335  7.330  7.264  7.344  7.317  7.333  7.383  7.342  7.345  7.356  -  PCO2 arterial 32.0 - 48.0 mmHg - - 43.8  44.4  54.9  46.1  43.0  46.4  40.6  42.8  43.3  38.8  -  Bicarbonate 20.0 - 28.0 mmol/L - - 23.3  23.5  25.0  25.1  22.0  24.6  24.2  23.2  22.6  23.6  21.2  -  TCO2 22 - 32 mmol/L - - '25  25  27  25  26  23  24  26  25  23  24  24  25  22  25  '$ -  Acid-base deficit 0.0 - 2.0 mmol/L - - 3.0  3.0  3.0  1.0  4.0  1.0  1.0  2.0  4.0  2.0  3.4  -  O2 Saturation % - - 99.0  99.0  98.0  100.0  100.0  100.0  100.0  100.0  70.0  100.0  97.9  63.0       08/06/2021  Labs for ITP Cardiac and Pulmonary Rehab  Cholestrol 122   LDL (calc) 29   HDL-C 31   Trlycerides 446   Hemoglobin A1c 7.6   PH, Arterial -  PCO2 arterial -  Bicarbonate -  TCO2 -  Acid-base deficit -  O2 Saturation -    Capillary Blood Glucose: Lab Results  Component Value Date   GLUCAP 149 (H) 07/09/2021   GLUCAP 160 (H) 07/09/2021   GLUCAP 181 (H) 07/07/2021   GLUCAP 161 (H) 07/07/2021   GLUCAP 137 (H) 04/07/2021     Exercise Target Goals: Exercise Program Goal: Individual exercise prescription set using results from initial 6 min walk test and THRR while considering  patient's activity barriers and safety.   Exercise Prescription Goal: Initial exercise prescription builds to 30-45 minutes a day of aerobic activity, 2-3 days per week.  Home exercise guidelines will be given to patient during program as part of exercise prescription that the participant will acknowledge.  Activity  Barriers & Risk Stratification:  Activity Barriers &  Cardiac Risk Stratification - 07/03/21 1036       Activity Barriers & Cardiac Risk Stratification   Activity Barriers Arthritis;Neck/Spine Problems;Deconditioning;Shortness of Breath;Balance Concerns    Cardiac Risk Stratification High             6 Minute Walk:  6 Minute Walk     Row Name 07/03/21 1035         6 Minute Walk   Phase Initial     Distance 1536 feet     Walk Time 6 minutes     # of Rest Breaks 0     MPH 2.91     METS 3.71     RPE 11     Perceived Dyspnea  0     VO2 Peak 13     Symptoms Yes (comment)     Comments Lighthededness     Resting HR 67 bpm     Resting BP 142/84     Resting Oxygen Saturation  100 %     Exercise Oxygen Saturation  during 6 min walk 98 %     Max Ex. HR 76 bpm     Max Ex. BP 156/80     2 Minute Post BP 140/80              Oxygen Initial Assessment:   Oxygen Re-Evaluation:   Oxygen Discharge (Final Oxygen Re-Evaluation):   Initial Exercise Prescription:  Initial Exercise Prescription - 07/03/21 1000       Date of Initial Exercise RX and Referring Provider   Date 07/03/21    Referring Provider Dorris Carnes, MD    Expected Discharge Date 08/29/21      Recumbant Bike   Level 2    RPM 60    Minutes 15    METs 2.5      T5 Nustep   Level 2    SPM 80    Minutes 15    METs 2.5      Prescription Details   Frequency (times per week) 3    Duration Progress to 30 minutes of continuous aerobic without signs/symptoms of physical distress      Intensity   THRR 40-80% of Max Heartrate 65-129    Ratings of Perceived Exertion 11-13    Perceived Dyspnea 0-4      Progression   Progression Continue progressive overload as per policy without signs/symptoms or physical distress.      Resistance Training   Training Prescription Yes    Weight 4 lbs    Reps 10-15             Perform Capillary Blood Glucose checks as needed.  Exercise Prescription Changes:   Exercise Prescription Changes     Row Name  07/07/21 0900 07/16/21 1000 08/04/21 1400 08/08/21 1400       Response to Exercise   Blood Pressure (Admit) 122/68 124/80 142/84 128/70    Blood Pressure (Exercise) 142/72 125/62 142/82 122/78    Blood Pressure (Exit) 130/80 122/78 120/68 124/76    Heart Rate (Admit) 67 bpm 64 bpm 65 bpm 67 bpm    Heart Rate (Exercise) 81 bpm 81 bpm 94 bpm 87 bpm    Heart Rate (Exit) 69 bpm 65 bpm 72 bpm 70 bpm    Rating of Perceived Exertion (Exercise) '13 12 12 13    '$ Symptoms None None None None    Comments Pt's first day in the CRP2 program Reviewed  METs Reviewed METs and goals Reviewed Home exercise Rx    Duration Continue with 30 min of aerobic exercise without signs/symptoms of physical distress. Continue with 30 min of aerobic exercise without signs/symptoms of physical distress. Continue with 30 min of aerobic exercise without signs/symptoms of physical distress. Continue with 30 min of aerobic exercise without signs/symptoms of physical distress.    Intensity THRR unchanged THRR unchanged THRR unchanged THRR unchanged      Progression   Progression Continue to progress workloads to maintain intensity without signs/symptoms of physical distress. Continue to progress workloads to maintain intensity without signs/symptoms of physical distress. Continue to progress workloads to maintain intensity without signs/symptoms of physical distress. Continue to progress workloads to maintain intensity without signs/symptoms of physical distress.    Average METs 1.85 2.4 2.9 3      Resistance Training   Training Prescription Yes No Yes Yes    Weight 4 lbs No weights on Wednesdays 5 lb wts 5 lb wts    Reps 10-15 -- 10-15 10-15    Time 10 Minutes -- 10 Minutes 10 Minutes      Interval Training   Interval Training No No No No      Recumbant Bike   Level '2 3 4 4    '$ Minutes '15 15 15 15    '$ METs 1.5 2.5 3 3.3      T5 Nustep   Level '2 3 4 4    '$ Minutes '15 15 15 15    '$ METs 2.2 2.4 2.8 2.7      Home Exercise  Plan   Plans to continue exercise at -- -- -- Home (comment)    Frequency -- -- -- Add 2 additional days to program exercise sessions.    Initial Home Exercises Provided -- -- -- 08/08/21             Exercise Comments:   Exercise Comments     Row Name 07/07/21 0956 07/16/21 1055 08/04/21 1548 08/08/21 1143     Exercise Comments Pt's first day in the CRP2 program. No complaints with todays session. Reviewed METs. Pt increased workloads on nustep and recumbent bike. Reviewed METs and goals. Pt is making goos progress and voices he sees improvement in strength and stamina. Pt also voices that he is enyoing the CRP2 program. Reviewed home exercise Rx. Pt is not walking at home with any regularity. Pt encouraged to walk 2 x/week for 30 minutes. This will give pt 5 days of exercise (150 minutes). Pt verbalized understanding of the home exercise Rx and was provided a copy.             Exercise Goals and Review:   Exercise Goals     Row Name 07/03/21 1037             Exercise Goals   Increase Physical Activity Yes       Intervention Provide advice, education, support and counseling about physical activity/exercise needs.;Develop an individualized exercise prescription for aerobic and resistive training based on initial evaluation findings, risk stratification, comorbidities and participant's personal goals.       Expected Outcomes Short Term: Attend rehab on a regular basis to increase amount of physical activity.;Long Term: Add in home exercise to make exercise part of routine and to increase amount of physical activity.;Long Term: Exercising regularly at least 3-5 days a week.       Increase Strength and Stamina Yes       Intervention Provide advice, education,  support and counseling about physical activity/exercise needs.;Develop an individualized exercise prescription for aerobic and resistive training based on initial evaluation findings, risk stratification, comorbidities and  participant's personal goals.       Expected Outcomes Short Term: Increase workloads from initial exercise prescription for resistance, speed, and METs.;Short Term: Perform resistance training exercises routinely during rehab and add in resistance training at home;Long Term: Improve cardiorespiratory fitness, muscular endurance and strength as measured by increased METs and functional capacity (6MWT)       Able to understand and use rate of perceived exertion (RPE) scale Yes       Intervention Provide education and explanation on how to use RPE scale       Expected Outcomes Short Term: Able to use RPE daily in rehab to express subjective intensity level;Long Term:  Able to use RPE to guide intensity level when exercising independently       Knowledge and understanding of Target Heart Rate Range (THRR) Yes       Intervention Provide education and explanation of THRR including how the numbers were predicted and where they are located for reference       Expected Outcomes Short Term: Able to state/look up THRR;Short Term: Able to use daily as guideline for intensity in rehab;Long Term: Able to use THRR to govern intensity when exercising independently       Understanding of Exercise Prescription Yes       Intervention Provide education, explanation, and written materials on patient's individual exercise prescription       Expected Outcomes Short Term: Able to explain program exercise prescription;Long Term: Able to explain home exercise prescription to exercise independently                Exercise Goals Re-Evaluation :  Exercise Goals Re-Evaluation     Row Name 07/07/21 0954 08/04/21 1546           Exercise Goal Re-Evaluation   Exercise Goals Review Increase Physical Activity;Increase Strength and Stamina;Able to understand and use rate of perceived exertion (RPE) scale;Knowledge and understanding of Target Heart Rate Range (THRR);Understanding of Exercise Prescription Increase Physical  Activity;Increase Strength and Stamina;Able to understand and use rate of perceived exertion (RPE) scale;Knowledge and understanding of Target Heart Rate Range (THRR);Understanding of Exercise Prescription      Comments Pt's first day in the CRP2 program. Pt understnads the exercise Rx, RPE scale and THRR. Reviewed METs and goals. Pt voices he has improved strength and stamina which was his short term goal. Pt still has some SOB and that has not improved as much as he would like, which was also a goal.      Expected Outcomes Will continue to monitor patient and progress exercise workloads as tolerated. Will continue to monitor patient and progress exercise workloads as tolerated.               Discharge Exercise Prescription (Final Exercise Prescription Changes):  Exercise Prescription Changes - 08/08/21 1400       Response to Exercise   Blood Pressure (Admit) 128/70    Blood Pressure (Exercise) 122/78    Blood Pressure (Exit) 124/76    Heart Rate (Admit) 67 bpm    Heart Rate (Exercise) 87 bpm    Heart Rate (Exit) 70 bpm    Rating of Perceived Exertion (Exercise) 13    Symptoms None    Comments Reviewed Home exercise Rx    Duration Continue with 30 min of aerobic exercise without signs/symptoms of physical  distress.    Intensity THRR unchanged      Progression   Progression Continue to progress workloads to maintain intensity without signs/symptoms of physical distress.    Average METs 3      Resistance Training   Training Prescription Yes    Weight 5 lb wts    Reps 10-15    Time 10 Minutes      Interval Training   Interval Training No      Recumbant Bike   Level 4    Minutes 15    METs 3.3      T5 Nustep   Level 4    Minutes 15    METs 2.7      Home Exercise Plan   Plans to continue exercise at Home (comment)    Frequency Add 2 additional days to program exercise sessions.    Initial Home Exercises Provided 08/08/21             Nutrition:  Target Goals:  Understanding of nutrition guidelines, daily intake of sodium '1500mg'$ , cholesterol '200mg'$ , calories 30% from fat and 7% or less from saturated fats, daily to have 5 or more servings of fruits and vegetables.  Biometrics:  Pre Biometrics - 07/03/21 0900       Pre Biometrics   Waist Circumference 46 inches    Hip Circumference 39.5 inches    Waist to Hip Ratio 1.16 %    Triceps Skinfold 10 mm    % Body Fat 29.2 %    Grip Strength 44 kg    Flexibility 12.5 in    Single Leg Stand 30 seconds              Nutrition Therapy Plan and Nutrition Goals:  Nutrition Therapy & Goals - 08/15/21 0959       Nutrition Therapy   Diet Heart Healthy/ Carbohydrate Consistent    Drug/Food Interactions Statins/Certain Fruits      Personal Nutrition Goals   Nutrition Goal Patient to implement lean protein/plant protein, fruits, vegetables, whole grains, and low fat dairy as part of a heart healthy meal plan.   in progress   Personal Goal #2 Patient to limit food sources of saturated fat, trans fat, sodium and refined carbohydrates.   in progress   Personal Goal #3 Patient to reduce sodium intake to <2300 mg per day.    Comments Patient continues to make progress with dietary changes. Patient continues to monitor blood sugar. He has reduced after dinner snacking and reports am/fasting blood sugars improved to 130-140s.      Intervention Plan   Intervention Prescribe, educate and counsel regarding individualized specific dietary modifications aiming towards targeted core components such as weight, hypertension, lipid management, diabetes, heart failure and other comorbidities.    Expected Outcomes Short Term Goal: Understand basic principles of dietary content, such as calories, fat, sodium, cholesterol and nutrients.;Short Term Goal: A plan has been developed with personal nutrition goals set during dietitian appointment.;Long Term Goal: Adherence to prescribed nutrition plan.              Nutrition Assessments:  Nutrition Assessments - 07/07/21 1208       Rate Your Plate Scores   Pre Score 59            MEDIFICTS Score Key: ?70 Need to make dietary changes  40-70 Heart Healthy Diet ? 40 Therapeutic Level Cholesterol Diet   Flowsheet Row CARDIAC REHAB PHASE II EXERCISE from 07/07/2021 in [place]  [facility]  Picture Your Plate Total Score on Admission 59      Picture Your Plate Scores: <10 Unhealthy dietary pattern with much room for improvement. 41-50 Dietary pattern unlikely to meet recommendations for good health and room for improvement. 51-60 More healthful dietary pattern, with some room for improvement.  >60 Healthy dietary pattern, although there may be some specific behaviors that could be improved.    Nutrition Goals Re-Evaluation:  Nutrition Goals Re-Evaluation     [place] Name 08/01/21 0918 08/15/21 0959           Goals   Current Weight 209 lb 7 oz (95 kg) 206 lb 2.1 oz (93.5 kg)      Expected Outcome Goals in progress. Patient continues to work on reducing fast food intake/saturated fat intake; however, intake remains variable. Discussed and gave handouts for healthy convenience foods and reviewed leaner protein options and high fiber food choices. Patient continues to make progress with dietary changes. Patient continues to monitor blood sugar. He has reduced after dinner snacking and reports am/fasting blood sugars improved to 130-140s.               Nutrition Goals Re-Evaluation:  Nutrition Goals Re-Evaluation     [place] Name 08/01/21 0918 08/15/21 0959           Goals   Current Weight 209 lb 7 oz (95 kg) 206 lb 2.1 oz (93.5 kg)      Expected Outcome Goals in progress. Patient continues to work on reducing fast food intake/saturated fat intake; however, intake remains variable. Discussed and gave handouts for healthy convenience foods and reviewed leaner protein options and high fiber food choices. Patient  continues to make progress with dietary changes. Patient continues to monitor blood sugar. He has reduced after dinner snacking and reports am/fasting blood sugars improved to 130-140s.               Nutrition Goals Discharge (Final Nutrition Goals Re-Evaluation):  Nutrition Goals Re-Evaluation - 08/15/21 0959       Goals   Current Weight 206 lb 2.1 oz (93.5 kg)    Expected Outcome Patient continues to make progress with dietary changes. Patient continues to monitor blood sugar. He has reduced after dinner snacking and reports am/fasting blood sugars improved to 130-140s.             Psychosocial: Target Goals: Acknowledge presence or absence of significant depression and/or stress, maximize coping skills, provide positive support system. Participant is able to verbalize types and ability to use techniques and skills needed for reducing stress and depression.  Initial Review & Psychosocial Screening:  Initial Psych Review & Screening - 07/03/21 1458       Initial Review   Current issues with Current Stress Concerns;Current Anxiety/Panic    Source of Stress Concerns Chronic Illness;Financial    Comments Mannix has anxiety and is on medication      Family Dynamics   Good Support System? Yes   Tyreke lives alon he has a daughter for support     Barriers   Psychosocial barriers to participate in program The patient should benefit from training in stress management and relaxation.      Screening Interventions   Interventions Encouraged to exercise    Expected Outcomes Long Term Goal: Stressors or current issues are controlled or eliminated.             Quality of Life Scores:  Quality of Life - 07/03/21 1032  Quality of Life   Select Quality of Life      Quality of Life Scores   Health/Function Pre 20.4 %    Socioeconomic Pre 21.81 %    Psych/Spiritual Pre 26.57 %    Family Pre 22.8 %    GLOBAL Pre 22.3 %            Scores of 19 and below usually  indicate a poorer quality of life in these areas.  A difference of  2-3 points is a clinically meaningful difference.  A difference of 2-3 points in the total score of the Quality of Life Index has been associated with significant improvement in overall quality of life, self-image, physical symptoms, and general health in studies assessing change in quality of life.  PHQ-9: Review Flowsheet       07/03/2021 11/18/2020 01/19/2017  Depression screen PHQ 2/9  Decreased Interest 0 0 0  Down, Depressed, Hopeless 0 0 0  PHQ - 2 Score 0 0 0  Altered sleeping - 0 -  Tired, decreased energy - 0 -  Change in appetite - 0 -  Feeling bad or failure about yourself  - 0 -  Trouble concentrating - 0 -  Moving slowly or fidgety/restless - 0 -  Suicidal thoughts - 0 -  PHQ-9 Score - 0 -  Difficult doing work/chores - Not difficult at all -   Interpretation of Total Score  Total Score Depression Severity:  1-4 = Minimal depression, 5-9 = Mild depression, 10-14 = Moderate depression, 15-19 = Moderately severe depression, 20-27 = Severe depression   Psychosocial Evaluation and Intervention:   Psychosocial Re-Evaluation:  Psychosocial Re-Evaluation     Row Name 07/08/21 0758 07/29/21 0801 08/20/21 1700         Psychosocial Re-Evaluation   Current issues with Current Stress Concerns;Current Anxiety/Panic Current Stress Concerns;Current Anxiety/Panic Current Stress Concerns;Current Anxiety/Panic     Comments Vrishank started exercise at cardiac rehab on 07/07/21. Leverett did well with exercise. Vital signs and CBG's were stable Jarmar has not voiced any increased concerns or stressors during exercise at cardiac rehab. Dannell has not voiced any increased concerns or stressors during exercise at cardiac rehab. Saburo will complete phase 2 cardiac rehab on 08/29/21     Expected Outcomes Leotis Pain will have decreased anxiety and stress concerns upon completion of phase 2 cardiac rehab. Le[place]is Pain will have decreased  anxiety and stress concerns upon completion of phase 2 cardiac rehab. [place]is Pain will have decreased anxiety and stress concerns upon completion of phase 2 cardiac rehab.     Interventions Encouraged to attend Cardiac Rehabilitation for the exercise Encouraged to attend Cardiac Rehabilitation for the exercise Encouraged to attend Cardiac Rehabilitation for the exercise     Continue Psychosocial Services  Follow up required by staff Follow up required by staff No Follow up required       Initial Review   Source of Stress Concerns Chronic Illness;Financial Chronic Illness;Financial Chronic Illness;Financial     Comments Will continue to monitor and offer support as needed Will continue to monitor and offer support as needed Will continue to monitor and offer support as needed              Psychosocial Discharge (Final Psychosocial Re-Evaluation):  Psychosocial Re-Evaluation - 08/20/21 1700       Psychosocial Re-Evaluation   Current issues with Current Stress Concerns;Current Anxiety/Panic    Comments Bearett has not voiced any increased concerns or stressors during exercise at cardiac rehab. Zackrey will  complete phase 2 cardiac rehab on 08/29/21    Expected Outcomes Leotis Pain will have decreased anxiety and stress concerns upon completion of phase 2 cardiac rehab.    Interventions Encouraged to attend Cardiac Rehabilitation for the exercise    Continue Psychosocial Services  No Follow up required      Initial Review   Source of Stress Concerns Chronic Illness;Financial    Comments Will continue to monitor and offer support as needed             Vocational Rehabilitation: Provide vocational rehab assistance to qualifying candidates.   Vocational Rehab Evaluation & Intervention:  Vocational Rehab - 07/03/21 1501       Initial Vocational Rehab Evaluation & Intervention   Assessment shows need for Vocational Rehabilitation No   Slayter is disabled and does not need vocational rehab at  this time            Education: Education Goals: Education classes will be provided on a weekly basis, covering required topics. Participant will state understanding/return demonstration of topics presented.    Education - 08/20/21 0900       Education   Cardiac Education Topics Pritikin    Sales executive    Weekly Topic Satisfying Salads and Dressings    Instruction Review Code 1- Verbalizes Understanding    Class Start Time 0813    Class Stop Time 0900    Class Time Calculation (min) 47 min             Core Videos: Exercise    Move It!  Clinical staff conducted group or individual video education with verbal and written material and guidebook.  Patient learns the recommended Pritikin exercise program. Exercise with the goal of living a long, healthy life. Some of the health benefits of exercise include controlled diabetes, healthier blood pressure levels, improved cholesterol levels, improved heart and lung capacity, improved sleep, and better body composition. Everyone should speak with their doctor before starting or changing an exercise routine.  Biomechanical Limitations Clinical staff conducted group or individual video education with verbal and written material and guidebook.  Patient learns how biomechanical limitations can impact exercise and how we can mitigate and possibly overcome limitations to have an impactful and balanced exercise routine.  Body Composition Clinical staff conducted group or individual video education with verbal and written material and guidebook.  Patient learns that body composition (ratio of muscle mass to fat mass) is a key component to assessing overall fitness, rather than body weight alone. Increased fat mass, especially visceral belly fat, can put [place] at increased risk for metabolic syndrome, type 2 diabetes, heart disease, and even death. It is recommended to combine diet and exercise  (cardiovascular and resistance training) to improve your body composition. Seek guidance from your physician and exercise physiologist before implementing an exercise routine.  Exercise Action Plan Clinical staff conducted group or individual video education with verbal and written material and guidebook.  Patient learns the recommended strategies to achieve and enjoy long-term exercise adherence, including variety, self-motivation, self-efficacy, and positive decision making. Benefits of exercise include fitness, good health, weight management, more energy, better sleep, less stress, and overall well-being.  Medical   Heart Disease Risk Reduction Clinical staff conducted group or individual video education with verbal and written material and guidebook.  Patient learns our heart is our most vital organ as it circulates oxygen, nutrients, white blood cells, and hormones throughout  the entire body, and carries waste away. Data supports a plant-based eating plan like the Pritikin Program for its effectiveness in slowing progression of and reversing heart disease. The video provides a number of recommendations to address heart disease.   Metabolic Syndrome and Belly Fat  Clinical staff conducted group or individual video education with verbal and written material and guidebook.  Patient learns what metabolic syndrome is, how it leads to heart disease, and how one can reverse it and keep it from coming back. You have metabolic syndrome if you have 3 of the following 5 criteria: abdominal obesity, high blood pressure, high triglycerides, low HDL cholesterol, and high blood sugar.  Hypertension and Heart Disease Clinical staff conducted group or individual video education with verbal and written material and guidebook.  Patient learns that high blood pressure, or hypertension, is very common in the [place]. Hypertension is largely due to excessive salt intake, but other important risk factors  include being overweight, physical inactivity, drinking too much alcohol, smoking, and not eating enough potassium from fruits and vegetables. High blood pressure is a leading risk factor for heart attack, stroke, congestive heart failure, dementia, kidney failure, and premature death. Long-term effects of excessive salt intake include stiffening of the arteries and thickening of heart muscle and organ damage. Recommendations include ways to reduce hypertension and the risk of heart disease.  Diseases of Our Time - Focusing on Diabetes Clinical staff conducted group or individual video education with verbal and written material and guidebook.  Patient learns why the best way to stop diseases of our time is prevention, through food and other lifestyle changes. Medicine (such as prescription pills and surgeries) is often only a Band-Aid on the problem, not a long-term solution. Most common diseases of our time include obesity, type 2 diabetes, hypertension, heart disease, and cancer. The Pritikin Program is recommended and has been proven to help reduce, reverse, and/or prevent the damaging effects of metabolic syndrome.  Nutrition   Overview of the Pritikin Eating Plan  Clinical staff conducted group or individual video education with verbal and written material and guidebook.  Patient learns about the [place] for disease risk reduction. The [place] emphasizes a wide variety of unrefined, minimally-processed carbohydrates, like fruits, vegetables, whole grains, and legumes. Go, Caution, and Stop food choices are explained. Plant-based and lean animal proteins are emphasized. Rationale provided for low sodium intake for blood pressure control, low added sugars for blood sugar stabilization, and low added fats and oils for coronary artery disease risk reduction and weight management.  Calorie Density  Clinical staff conducted group or individual video education with verbal and  written material and guidebook.  Patient learns about calorie density and how it impacts the Pritikin Eating Plan. Knowing the characteristics of the food you choose will help you decide whether those foods will lead to weight gain or weight loss, and whether you want to consume more or less of them. Weight loss is usually a side effect of the Pritikin Eating Plan because of its focus on low calorie-dense foods.  Label Reading  Clinical staff conducted group or individual video education with verbal and written material and guidebook.  Patient learns about the Pritikin recommended label reading guidelines and corresponding recommendations regarding calorie density, added sugars, sodium content, and whole grains.  Dining Out - Part 1  Clinical staff conducted group or individual video education with verbal and written material and guidebook.  Patient learns that restaurant meals can be  sabotaging because they can be so high in calories, fat, sodium, and/or sugar. Patient learns recommended strategies on how to positively address this and avoid unhealthy pitfalls.  Facts on Fats  Clinical staff conducted group or individual video education with verbal and written material and guidebook.  Patient learns that lifestyle modifications can be just as effective, if not more so, as many medications for lowering your risk of heart disease. A Pritikin lifestyle can help to reduce your risk of inflammation and atherosclerosis (cholesterol build-up, or plaque, in the artery walls). Lifestyle interventions such as dietary choices and physical activity address the cause of atherosclerosis. A review of the types of fats and their impact on blood cholesterol levels, along with dietary recommendations to reduce fat intake is also included.  Nutrition Action Plan  Clinical staff conducted group or individual video education with verbal and written material and guidebook.  Patient learns how to incorporate Pritikin  recommendations into their lifestyle. Recommendations include planning and keeping personal health goals in mind as an important part of their success.  Healthy Mind-Set    Healthy Minds, Bodies, Hearts  Clinical staff conducted group or individual video education with verbal and written material and guidebook.  Patient learns how to identify when they are stressed. Video will discuss the impact of that stress, as well as the many benefits of stress management. Patient will also be introduced to stress management techniques. The way we think, act, and feel has an impact on our hearts.  How Our Thoughts Can Heal Our Hearts  Clinical staff conducted group or individual video education with verbal and written material and guidebook.  Patient learns that negative thoughts can cause depression and anxiety. This can result in negative lifestyle behavior and serious health problems. Cognitive behavioral therapy is an effective method to help control our thoughts in order to change and improve our emotional outlook.  Additional Videos:  Exercise    Improving Performance  Clinical staff conducted group or individual video education with verbal and written material and guidebook.  Patient learns to use a non-linear approach by alternating intensity levels and lengths of time spent exercising to help burn more calories and lose more body fat. Cardiovascular exercise helps improve heart health, metabolism, hormonal balance, blood sugar control, and recovery from fatigue. Resistance training improves strength, endurance, balance, coordination, reaction time, metabolism, and muscle mass. Flexibility exercise improves circulation, posture, and balance. Seek guidance from your physician and exercise physiologist before implementing an exercise routine and learn your capabilities and proper form for all exercise.  Introduction to Yoga  Clinical staff conducted group or individual video education with verbal and  written material and guidebook.  Patient learns about yoga, a discipline of the coming together of mind, breath, and body. The benefits of yoga include improved flexibility, improved range of motion, better posture and core strength, increased lung function, weight loss, and positive self-image. Yoga's heart health benefits include lowered blood pressure, healthier heart rate, decreased cholesterol and triglyceride levels, improved immune function, and reduced stress. Seek guidance from your physician and exercise physiologist before implementing an exercise routine and learn your capabilities and proper form for all exercise.  Medical   Aging: Enhancing Your Quality of Life  Clinical staff conducted group or individual video education with verbal and written material and guidebook.  Patient learns key strategies and recommendations to stay in good physical health and enhance quality of life, such as prevention strategies, having an advocate, securing a Health Care Proxy and Power  of Attorney, and keeping a list of medications and system for tracking them. It also discusses how to avoid risk for bone loss.  Biology of Weight Control  Clinical staff conducted group or individual video education with verbal and written material and guidebook.  Patient learns that weight gain occurs because we consume more calories than we burn (eating more, moving less). Even if your body weight is normal, you may have higher ratios of fat compared to muscle mass. Too much body fat puts you at increased risk for cardiovascular disease, heart attack, stroke, type 2 diabetes, and obesity-related cancers. In addition to exercise, following the [place] can help reduce your risk.  Decoding Lab Results  Clinical staff conducted group or individual video education with verbal and written material and guidebook.  Patient learns that lab test reflects one measurement whose values change over time and are influenced  by many factors, including medication, stress, sleep, exercise, food, hydration, pre-existing medical conditions, and more. It is recommended to use the knowledge from this video to become more involved with your lab results and evaluate your numbers to speak with your doctor.   Diseases of Our Time - Overview  Clinical staff conducted group or individual video education with verbal and written material and guidebook.  Patient learns that according to the CDC, 50% to 70% of chronic diseases (such as obesity, type 2 diabetes, elevated lipids, hypertension, and heart disease) are avoidable through lifestyle improvements including healthier food choices, listening to satiety cues, and increased physical activity.  Sleep Disorders Clinical staff conducted group or individual video education with verbal and written material and guidebook.  Patient learns how good quality and duration of sleep are important to overall health and well-being. Patient also learns about sleep disorders and how they impact health along with recommendations to address them, including discussing with a physician.  Nutrition  Dining Out - Part 2 Clinical staff conducted group or individual video education with verbal and written material and guidebook.  Patient learns how to plan ahead and communicate in order to maximize their dining experience in a healthy and nutritious manner. Included are recommended food choices based on the type of restaurant the patient is visiting.   Fueling a Best boy conducted group or individual video education with verbal and written material and guidebook.  There is a strong connection between our food choices and our health. Diseases like obesity and type 2 diabetes are very prevalent and are in large-part due to lifestyle choices. The Pritikin Eating Plan provides plenty of food and hunger-curbing satisfaction. It is easy to follow, affordable, and helps reduce health  risks.  Menu Workshop  Clinical staff conducted group or individual video education with verbal and written material and guidebook.  Patient learns that restaurant meals can sabotage health goals because they are often packed with calories, fat, sodium, and sugar. Recommendations include strategies to plan ahead and to communicate with the manager, chef, or server to help order a healthier meal.  Planning Your Eating Strategy  Clinical staff conducted group or individual video education with verbal and written material and guidebook.  Patient learns about the [place] and its benefit of reducing the risk of disease. The [place] does not focus on calories. Instead, it emphasizes high-quality, nutrient-rich foods. By knowing the characteristics of the foods, we choose, we can determine their calorie density and make informed decisions.  Targeting Your Nutrition Priorities  Clinical staff conducted group or  individual video education with verbal and written material and guidebook.  Patient learns that lifestyle habits have a tremendous impact on disease risk and progression. This video provides eating and physical activity recommendations based on your personal health goals, such as reducing LDL cholesterol, losing weight, preventing or controlling type 2 diabetes, and reducing high blood pressure.  Vitamins and Minerals  Clinical staff conducted group or individual video education with verbal and written material and guidebook.  Patient learns different ways to obtain key vitamins and minerals, including through a recommended healthy diet. It is important to discuss all supplements you take with your doctor.   Healthy Mind-Set    Smoking Cessation  Clinical staff conducted group or individual video education with verbal and written material and guidebook.  Patient learns that cigarette smoking and tobacco addiction pose a serious health risk which affects millions of  people. Stopping smoking will significantly reduce the risk of heart disease, lung disease, and many forms of cancer. Recommended strategies for quitting are covered, including working with your doctor to develop a successful plan.  Culinary   Becoming a Financial trader conducted group or individual video education with verbal and written material and guidebook.  Patient learns that cooking at home can be healthy, cost-effective, quick, and puts them in control. Keys to cooking healthy recipes will include looking at your recipe, assessing your equipment needs, planning ahead, making it simple, choosing cost-effective seasonal ingredients, and limiting the use of added fats, salts, and sugars.  Cooking - Breakfast and Snacks  Clinical staff conducted group or individual video education with verbal and written material and guidebook.  Patient learns how important breakfast is to satiety and nutrition through the entire day. Recommendations include key foods to eat during breakfast to help stabilize blood sugar levels and to prevent overeating at meals later in the day. Planning ahead is also a key component.  Cooking - Human resources officer conducted group or individual video education with verbal and written material and guidebook.  Patient learns eating strategies to improve overall health, including an approach to cook more at home. Recommendations include thinking of animal protein as a side on your plate rather than center stage and focusing instead on lower calorie dense options like vegetables, fruits, whole grains, and plant-based proteins, such as beans. Making sauces in large quantities to freeze for later and leaving the skin on your vegetables are also recommended to maximize your experience.  Cooking - Healthy Salads and Dressing Clinical staff conducted group or individual video education with verbal and written material and guidebook.  Patient learns that  vegetables, fruits, whole grains, and legumes are the foundations of the [place]. Recommendations include how to incorporate each of these in flavorful and healthy salads, and how to create homemade salad dressings. Proper handling of ingredients is also covered. Cooking - Soups and [employer] - Soups and Desserts Clinical staff conducted group or individual video education with verbal and written material and guidebook.  Patient learns that Pritikin soups and desserts make for easy, nutritious, and delicious snacks and meal components that are low in sodium, fat, sugar, and calorie density, while high in vitamins, minerals, and filling fiber. Recommendations include simple and healthy ideas for soups and desserts.   Overview     The Pritikin Solution Program Overview Clinical staff conducted group or individual video education with verbal and written material and guidebook.  Patient learns that the results of the Pritikin  Program have been documented in more than 100 articles published in peer-reviewed journals, and the benefits include reducing risk factors for (and, in some cases, even reversing) high cholesterol, high blood pressure, type 2 diabetes, obesity, and more! An overview of the three key pillars of the Pritikin Program will be covered: eating well, doing regular exercise, and having a healthy mind-set.  WORKSHOPS  Exercise: Exercise Basics: Building Your Action Plan Clinical staff led group instruction and group discussion with PowerPoint presentation and patient guidebook. To enhance the learning environment the use of posters, models and videos may be added. At the conclusion of this workshop, patients will comprehend the difference between physical activity and exercise, as well as the benefits of incorporating both, into their routine. Patients will understand the FITT (Frequency, Intensity, Time, and Type) principle and how to use it to build an exercise action  plan. In addition, safety concerns and other considerations for exercise and cardiac rehab will be addressed by the presenter. The purpose of this lesson is to promote a comprehensive and effective weekly exercise routine in order to improve patients' overall level of fitness.   Managing Heart Disease: Your Path to a Healthier Heart Clinical staff led group instruction and group discussion with PowerPoint presentation and patient guidebook. To enhance the learning environment the use of posters, models and videos may be added.At the conclusion of this workshop, patients will understand the anatomy and physiology of the heart. Additionally, they will understand how Pritikin's three pillars impact the risk factors, the progression, and the management of heart disease.  The purpose of this lesson is to provide a high-level overview of the heart, heart disease, and how the Pritikin lifestyle positively impacts risk factors.  Exercise Biomechanics Clinical staff led group instruction and group discussion with PowerPoint presentation and patient guidebook. To enhance the learning environment the use of posters, models and videos may be added. Patients will learn how the structural parts of their bodies function and how these functions impact their daily activities, movement, and exercise. Patients will learn how to promote a neutral spine, learn how to manage pain, and identify ways to improve their physical movement in order to promote healthy living. The purpose of this lesson is to expose patients to common physical limitations that impact physical activity. Participants will learn practical ways to adapt and manage aches and pains, and to minimize their effect on regular exercise. Patients will learn how to maintain good posture while sitting, walking, and lifting.  Balance Training and Fall Prevention  Clinical staff led group instruction and group discussion with PowerPoint presentation and  patient guidebook. To enhance the learning environment the use of posters, models and videos may be added. At the conclusion of this workshop, patients will understand the importance of their sensorimotor skills (vision, proprioception, and the vestibular system) in maintaining their ability to balance as they age. Patients will apply a variety of balancing exercises that are appropriate for their current level of function. Patients will understand the common causes for poor balance, possible solutions to these problems, and ways to modify their physical environment in order to minimize their fall risk. The purpose of this lesson is to teach patients about the importance of maintaining balance as they age and ways to minimize their risk of falling.  WORKSHOPS   Nutrition:  Fueling a Scientist, research (physical sciences) led group instruction and group discussion with PowerPoint presentation and patient guidebook. To enhance the learning environment the use of posters, models and videos  may be added. Patients will review the foundational principles of the [place] and understand what constitutes a serving size in each of the food groups. Patients will also learn Pritikin-friendly foods that are better choices when away from home and review make-ahead meal and snack options. Calorie density will be reviewed and applied to three nutrition priorities: weight maintenance, weight loss, and weight gain. The purpose of this lesson is to reinforce (in a group setting) the key concepts around what patients are recommended to eat and how to apply these guidelines when away from home by planning and selecting Pritikin-friendly options. Patients will understand how calorie density may be adjusted for different weight management goals.  Mindful Eating  Clinical staff led group instruction and group discussion with PowerPoint presentation and patient guidebook. To enhance the learning environment the use of posters,  models and videos may be added. Patients will briefly review the concepts of the [place] and the importance of low-calorie dense foods. The concept of mindful eating will be introduced as well as the importance of paying attention to internal hunger signals. Triggers for non-hunger eating and techniques for dealing with triggers will be explored. The purpose of this lesson is to provide patients with the opportunity to review the basic principles of the [place], discuss the value of eating mindfully and how to measure internal cues of hunger and fullness using the Hunger Scale. Patients will also discuss reasons for non-hunger eating and learn strategies to use for controlling emotional eating.  Targeting Your Nutrition Priorities Clinical staff led group instruction and group discussion with PowerPoint presentation and patient guidebook. To enhance the learning environment the use of posters, models and videos may be added. Patients will learn how to determine their genetic susceptibility to disease by reviewing their family history. Patients will gain insight into the importance of diet as part of an overall healthy lifestyle in mitigating the impact of genetics and other environmental insults. The purpose of this lesson is to provide patients with the opportunity to assess their personal nutrition priorities by looking at their family history, their own health history and current risk factors. Patients will also be able to discuss ways of prioritizing and modifying the [place] for their highest risk areas  Menu  Clinical staff led group instruction and group discussion with PowerPoint presentation and patient guidebook. To enhance the learning environment the use of posters, models and videos may be added. Using menus brought in from [employer], or printed from [employer], patients will apply the [place] dining out guidelines that were presented in the  [employer] video. Patients will also be able to practice these guidelines in a variety of provided scenarios. The purpose of this lesson is to provide patients with the opportunity to practice hands-on learning of the [place] with actual menus and practice scenarios.  Label Reading Clinical staff led group instruction and group discussion with PowerPoint presentation and patient guidebook. To enhance the learning environment the use of posters, models and videos may be added. Patients will review and discuss the [employer] label reading guidelines presented in [employer]'s Label Reading Educational series video. Using fool labels brought in from local grocery stores and markets, patients will apply the label reading guidelines and determine if the packaged food meet the [place] guidelines. The purpose of this lesson is to provide patients with the opportunity to review, discuss, and practice hands-on learning of the [place] Label Reading guidelines  with actual packaged food labels. [place] Workshops are designed to teach patients ways to prepare quick, simple, and affordable recipes at home. The importance of nutrition's role in chronic disease risk reduction is reflected in its emphasis in the overall [employer] program. By learning how to prepare essential core [employer] Eating Plan recipes, patients will increase control over what they eat; be able to customize the flavor of foods without the use of added salt, sugar, or fat; and improve the quality of the food they consume. By learning a set of core recipes which are easily assembled, quickly prepared, and affordable, patients are more likely to prepare more healthy foods at home. These workshops focus on convenient breakfasts, simple entres, side dishes, and desserts which can be prepared with minimal effort and are consistent with nutrition recommendations for cardiovascular risk  reduction. Cooking [employer] are taught by a Engineer, materials (RD) who has been trained by the [employer]. The chef or RD has a clear understanding of the importance of minimizing - if not completely eliminating - added fat, sugar, and sodium in recipes. Throughout the series of [place] Workshop sessions, patients will learn about healthy ingredients and efficient methods of cooking to build confidence in their capability to prepare    Cooking School weekly topics:  Adding Flavor- Sodium-Free  Fast and Healthy Breakfasts  Powerhouse Plant-Based Proteins  Satisfying Salads and Dressings  Simple Sides and Sauces  International Cuisine-Spotlight on the [place] Zones  Delicious Desserts  Savory Soups  Efficiency Cooking - Meals in a Snap  Tasty Appetizers and Snacks  Comforting Weekend Breakfasts  One-Pot Wonders   Fast Evening Meals  Easy [place] (Psychosocial): New Thoughts, New Behaviors Clinical staff led group instruction and group discussion with PowerPoint presentation and patient guidebook. To enhance the learning environment the use of posters, models and videos may be added. Patients will learn and practice techniques for developing effective health and lifestyle goals. Patients will be able to effectively apply the goal setting process learned to develop at least one new personal goal.  The purpose of this lesson is to expose patients to a new skill set of behavior modification techniques such as techniques setting SMART goals, overcoming barriers, and achieving new thoughts and new behaviors.  Managing Moods and Relationships Clinical staff led group instruction and group discussion with PowerPoint presentation and patient guidebook. To enhance the learning environment the use of posters, models and videos may be added. Patients will learn how emotional and chronic stress  factors can impact their health and relationships. They will learn healthy ways to manage their moods and utilize positive coping mechanisms. In addition, ICR patients will learn ways to improve communication skills. The purpose of this lesson is to expose patients to ways of understanding how one's mood and health are intimately connected. Developing a healthy outlook can help build positive relationships and connections with others. Patients will understand the importance of utilizing effective communication skills that include actively listening and being heard. They will learn and understand the importance of the "4 Cs" and especially Connections in fostering of a Healthy Mind-Set.  Healthy Sleep for a Healthy Heart Clinical staff led group instruction and group discussion with PowerPoint presentation and patient guidebook. To enhance the learning environment the use of posters, models and videos may be added. At the conclusion of this workshop, patients will be able to demonstrate  knowledge of the importance of sleep to overall health, well-being, and quality of life. They will understand the symptoms of, and treatments for, common sleep disorders. Patients will also be able to identify daytime and nighttime behaviors which impact sleep, and they will be able to apply these tools to help manage sleep-related challenges. The purpose of this lesson is to provide patients with a general overview of sleep and outline the importance of quality sleep. Patients will learn about a few of the most common sleep disorders. Patients will also be introduced to the concept of "sleep hygiene," and discover ways to self-manage certain sleeping problems through simple daily behavior changes. Finally, the workshop will motivate patients by clarifying the links between quality sleep and their goals of heart-healthy living.   Recognizing and Reducing Stress Clinical staff led group instruction and group discussion with  PowerPoint presentation and patient guidebook. To enhance the learning environment the use of posters, models and videos may be added. At the conclusion of this workshop, patients will be able to understand the types of stress reactions, differentiate between acute and chronic stress, and recognize the impact that chronic stress has on their health. They will also be able to apply different coping mechanisms, such as reframing negative self-talk. Patients will have the opportunity to practice a variety of stress management techniques, such as deep abdominal breathing, progressive muscle relaxation, and/or guided imagery.  The purpose of this lesson is to educate patients on the role of stress in their lives and to provide healthy techniques for coping with it.  Learning Barriers/Preferences:  Learning Barriers/Preferences - 07/03/21 1034       Learning Barriers/Preferences   Learning Barriers Sight   wears glasses   Learning Preferences Computer/Internet;Individual Instruction;Group Instruction;Pictoral;Skilled Demonstration;Video             Education Topics:  Knowledge Questionnaire Score:  Knowledge Questionnaire Score - 07/03/21 1033       Knowledge Questionnaire Score   Pre Score 19/24             Core Components/Risk Factors/Patient Goals at Admission:  Personal Goals and Risk Factors at Admission - 07/03/21 1033       Core Components/Risk Factors/Patient Goals on Admission    Weight Management Yes;Obesity;Weight Loss    Intervention Weight Management: Develop a combined nutrition and exercise program designed to reach desired caloric intake, while maintaining appropriate intake of nutrient and fiber, sodium and fats, and appropriate energy expenditure required for the weight goal.;Weight Management: Provide education and appropriate resources to help participant work on and attain dietary goals.;Weight Management/Obesity: Establish reasonable short term and long term  weight goals.;Obesity: Provide education and appropriate resources to help participant work on and attain dietary goals.    Admit Weight 203 lb 14.8 oz (92.5 kg)    Expected Outcomes Short Term: Continue to assess and modify interventions until short term weight is achieved;Long Term: Adherence to nutrition and physical activity/exercise program aimed toward attainment of established weight goal;Weight Maintenance: Understanding of the daily nutrition guidelines, which includes 25-35% calories from fat, 7% or less cal from saturated fats, less than '200mg'$  cholesterol, less than 1.5gm of sodium, & 5 or more servings of fruits and vegetables daily;Weight Loss: Understanding of general recommendations for a balanced deficit meal plan, which promotes 1-2 lb weight loss per week and includes a negative energy balance of [phone] kcal/d;Understanding recommendations for meals to include 15-35% energy as protein, 25-35% energy from fat, 35-60% energy from carbohydrates, less than '200mg'$  of  dietary cholesterol, 20-35 gm of total fiber daily;Understanding of distribution of calorie intake throughout the day with the consumption of 4-5 meals/snacks    Diabetes Yes    Intervention Provide education about signs/symptoms and action to take for hypo/hyperglycemia.;Provide education about proper nutrition, including hydration, and aerobic/resistive exercise prescription along with prescribed medications to achieve blood glucose in normal ranges: Fasting glucose 65-99 mg/dL    Expected Outcomes Short Term: Participant verbalizes understanding of the signs/symptoms and immediate care of hyper/hypoglycemia, proper foot care and importance of medication, aerobic/resistive exercise and nutrition plan for blood glucose control.;Long Term: Attainment of HbA1C < 7%.    Hypertension Yes    Intervention Provide education on lifestyle modifcations including regular physical activity/exercise, weight management, moderate sodium  restriction and increased consumption of fresh fruit, vegetables, and low fat dairy, alcohol moderation, and smoking cessation.;Monitor prescription use compliance.    Expected Outcomes Short Term: Continued assessment and intervention until BP is < 140/41m HG in hypertensive participants. < 130/84mHG in hypertensive participants with diabetes, heart failure or chronic kidney disease.;Long Term: Maintenance of blood pressure at goal levels.    Lipids Yes    Intervention Provide education and support for participant on nutrition & aerobic/resistive exercise along with prescribed medications to achieve LDL '70mg'$ , HDL >'40mg'$ .    Expected Outcomes Short Term: Participant states understanding of desired cholesterol values and is compliant with medications prescribed. Participant is following exercise prescription and nutrition guidelines.;Long Term: Cholesterol controlled with medications as prescribed, with individualized exercise RX and with personalized nutrition plan. Value goals: LDL < '70mg'$ , HDL > 40 mg.    Stress Yes    Intervention Offer individual and/or small group education and counseling on adjustment to heart disease, stress management and health-related lifestyle change. Teach and support self-help strategies.;Refer participants experiencing significant psychosocial distress to appropriate mental health specialists for further evaluation and treatment. When possible, include family members and significant others in education/counseling sessions.    Expected Outcomes Short Term: Participant demonstrates changes in health-related behavior, relaxation and other stress management skills, ability to obtain effective social support, and compliance with psychotropic medications if prescribed.;Long Term: Emotional wellbeing is indicated by absence of clinically significant psychosocial distress or social isolation.             Core Components/Risk Factors/Patient Goals Review:   Goals and Risk  Factor Review     Row Name 07/29/21 0803 08/20/21 1701           Core Components/Risk Factors/Patient Goals Review   Personal Goals Review Weight Management/Obesity;Stress;Hypertension;Lipids;Diabetes Weight Management/Obesity;Stress;Hypertension;Lipids;Diabetes      Review WeAyoubas been  doing well with exercise, vital signs and CBG's have been stable. WeHortonas been  doing well with exercise, vital signs and CBG's have been stable. WeAvaneeshill complete phase 2 cardiac rehab on 08/29/21      Expected Outcomes WeFerdinandill continue to participate in phase 2 cardiac rehab for exercise, nutrition and lifestyle modifications WeAdenill continue to participate in phase 2 cardiac rehab for exercise, nutrition and lifestyle modifications               Core Components/Risk Factors/Patient Goals at Discharge (Final Review):   Goals and Risk Factor Review - 08/20/21 1701       Core Components/Risk Factors/Patient Goals Review   Personal Goals Review Weight Management/Obesity;Stress;Hypertension;Lipids;Diabetes    Review WeBethelas been  doing well with exercise, vital signs and CBG's have been stable. WeDelorisill complete phase 2 cardiac rehab on  08/29/21    Expected Outcomes Gustave will continue to participate in phase 2 cardiac rehab for exercise, nutrition and lifestyle modifications             ITP Comments:  ITP Comments     Row Name 07/03/21 1230 07/08/21 0756 07/29/21 0800 08/20/21 1659     ITP Comments Dr Fransico Him MD, Medical Director 30 Day ITP Review. Zachari started cardiac rehab on 07/08/21 and did well with exercise. 30 Day ITP Review. Darryel has good attendance and participation in phase 2 cardiac rehab. 30 Day ITP Review. Jun has good attendance and participation in phase 2 cardiac rehab. Karmello will complete phase 2 cardiac rehab on 08/29/21             Comments:See ITP comments.Harrell Gave RN BSN

## 2021-08-22 ENCOUNTER — Encounter (HOSPITAL_COMMUNITY)
Admission: RE | Admit: 2021-08-22 | Discharge: 2021-08-22 | Disposition: A | Discharge status: Home / Self Care | Payer: Medicare Other | Source: Ambulatory Visit | Attending: Internal Medicine | Admitting: Internal Medicine

## 2021-08-22 DIAGNOSIS — Z952 Presence of prosthetic heart valve: Secondary | ICD-10-CM

## 2021-08-22 DIAGNOSIS — Z951 Presence of aortocoronary bypass graft: Secondary | ICD-10-CM

## 2021-08-25 ENCOUNTER — Encounter (HOSPITAL_COMMUNITY)
Admission: RE | Admit: 2021-08-25 | Discharge: 2021-08-25 | Disposition: A | Discharge status: Home / Self Care | Payer: Medicare Other | Source: Ambulatory Visit | Attending: Internal Medicine | Admitting: Internal Medicine

## 2021-08-25 VITALS — Ht 69.25 in | Wt 202.8 lb

## 2021-08-25 DIAGNOSIS — Z48812 Encounter for surgical aftercare following surgery on the circulatory system: Secondary | ICD-10-CM | POA: Diagnosis not present

## 2021-08-25 DIAGNOSIS — Z952 Presence of prosthetic heart valve: Secondary | ICD-10-CM | POA: Insufficient documentation

## 2021-08-25 DIAGNOSIS — Z951 Presence of aortocoronary bypass graft: Secondary | ICD-10-CM | POA: Diagnosis not present

## 2021-08-27 ENCOUNTER — Encounter (HOSPITAL_COMMUNITY)
Admission: RE | Admit: 2021-08-27 | Discharge: 2021-08-27 | Disposition: A | Discharge status: Home / Self Care | Payer: Medicare Other | Source: Ambulatory Visit | Attending: Internal Medicine | Admitting: Internal Medicine

## 2021-08-27 DIAGNOSIS — Z951 Presence of aortocoronary bypass graft: Secondary | ICD-10-CM

## 2021-08-27 DIAGNOSIS — Z952 Presence of prosthetic heart valve: Secondary | ICD-10-CM

## 2021-08-27 DIAGNOSIS — Z48812 Encounter for surgical aftercare following surgery on the circulatory system: Secondary | ICD-10-CM | POA: Diagnosis not present

## 2021-08-27 NOTE — Unmapped (Signed)
Called to verify patient's MRI scheduled with sedation.  Reminded patient to have an adult driver accompany patient to appointment.  Sedation used explained and all questions answered.

## 2021-08-27 NOTE — Unmapped (Signed)
[facility]  [facility]  [address]  [place], [place]  [address]  Ph: [phone]  Fax: [phone]     TRANSPLANT HEPATOLOGY FOLLOW UP CLINIC NOTE    PRIMARY CARE PROVIDER: Joaquim Nam, MD    DATE OF SERVICE: 08/28/2021    ASSESSMENT AND PLAN:   Anthony Alvarado is a 61 y.o. male who underwent liver transplant on 09/16/2018 for cryptogenic cirrhosis with HCC.  His liver enzymes have been mildly elevated over the past several months. Unclear etiology. He had COVID in January and has gained some weight.  Nonetheless will obtain biopsy to confirm it is not mild rejection.  He continues to have worsening DOE, Cardiology feels that it is the aortic stenosis and not CAD.    Labs today show mild elevations in transaminases. Plan to recheck in one month. Chest CT today without metastatic disease or other acute pulmonary process.  Moderate calcified atherosclerotic disease is evident within the coronary arterial distribution.  MRI of abdomen today reports diffuse hepatic steatosis, findings suggestive of fibrosis, area of peripheral arterial phase hyperenhancement within segment 5/6.  Findings are favored to be perfusional changes.  No new hepatic lesions are seen.  There are no indications of his liver being related to his shortness of breath and chest CT today did not show any pulmonary abnormalities.  He was wondering if his abdomen could be pressing on his diaphragms and causing shortness of breath.  His abdomen is soft, no ascites palpable on exam.  We discussed importance of weight loss and optimizing his metabolic risk factors to prevent worsening liver disease as well as coronary disease.  A referral is in place for [facility] medical weight loss center.  He will not need any further chest CTs but due to findings on abdominal MRI will repeat in 6 months.    1. Immunosuppression:  -continue Prograf 2 mg BID, goal 4-6  -continue Cellcept 250  Mg BID (consider discontinuing if liver enzymes stabilize)    2. Hx HCC  -[place] Score of 1: Abd MRI and Chest CT q 6 mos through 3rd txp anniversary; AFPs q 6 mos through 5th anniversary (09/16/2023)  -check AFP today  -CT chest  today : no metastatic disease, ectasia of ascending aorta 4.3cm, 11/202 4 cm  -MRI abd W/Wo contrast today: diffuse hepatic steatosis, ?fibrosis, wedge-shaped area of peripheral arterial phase hyperenhancement with hepatic seg 5/6, decreased in prominence compared to prior,  findings favored to be perfusional changes, no new lesions. Plan to repeat in 6 months    3. S/p AVR+CABGx1 11/2020   -tomorrow is last day of Cardiac rehab, encouraged him to begin walking, he may join [employer]      Metabolic syndrome  (management per PCP)  -DM metformin xr 500 mg daily  -HTN amlodipine 10 mg daily  -HLD pravastatin 40 mg  --referral to medical weight management    Preventative Health:   - Dermatology: This patient is at increased risk for the development of skin cancers due to immunosuppressant medications. Recommend yearly surveillance.  -DEXA scan: -09/20/2019 osteopenia. Patient continues on Fosamax per PCP  - Colonoscopy: Last colonoscopy was on 10/11/12 and showed no lesions. Repeat is due in 10 years (2024)     Vaccinations: We recommend that patients have vaccinations to prevent various infections that can occur, especially in the setting of having underlying liver disease. The following vaccinations should be given:  -Hepatitis A: Havrix #1 today, #2 due in 6  months  -Hepatitis B: Heplisav #1 today, #2 due in one month #3 due in 6 months  -Influenza (yearly): UTD  -Pneumococcal: PPSV23 (12/2017) and PCV13 (03/2019), eligible for Prevnar 20  -Zoster: Shingrix (age > 50)  01/2021, 08/2020  -SARS-CoV-2: Pfizer x4, bivalent 03/10/2021    Return to clinic in one year.    Priscille Heidelberg, NP  [facility]  Division of Gastroenterology &Hepatology    I personally spent 40 minutes face-to-face and non-face-to-face in the care of this patient, which includes all pre, intra, and post visit time on the date of service.  All documented time was specific to the E/M visit and does not include any procedures that may have been performed.    Subjective      Reason for visit: annual transplant appointment    HISTORY OF PRESENT ILLNESS:      Anthony Alvarado is a 61 y.o. male with past medical history of aortic valve stenosis, diabetes, hypertension, who underwent liver transplant on 09/16/2018 for cryptogenic cirrhosis with HCC.     Interval Hx:     Since last visit  On 08/24/2020, since that time he underwent a liver biopsy on 10/02/2020 which showed microsteatosis and portal inflammation with mild ACR. Overall considered steatosis and no steroids were indicated.On 12/20/2020 he underwent aortic valve replacement  and CABG x1. Postoperative course was uneventful. He has his last day of cardiac rehab tomorrow. He still has mild shortness of breath with exertion.  He has discussed this with his cardiologist's office visit on 08/06/2021.  Her note reports his echo in March showed his valve was functioning well and she does not think his shortness of breath represents angina.  Today ask whether or not his abdomen is causing his shortness of breath.    Objective      VITAL SIGNS: There were no vitals taken for this visit.  There is no height or weight on file to calculate BMI.  Wt Readings from Last 3 Encounters:   09/17/20 95.8 kg (211 lb 3.2 oz)   03/20/20 93.4 kg (206 lb)   09/18/19 93.7 kg (206 lb 8 oz)     PHYSICAL EXAM:  Constitutional: Alert, no acute distress, with good coloring  HENT: conjunctiva clear, anicteric, nares without discharge, neck supple  CV: Regular rate and rhythm  Lung: respirations even and unlabored  Abdomen: soft, non-distended, non-tender.  No ascites.   Extremities: No edema, well perfused  Neuro: No focal deficits. No asterixis.   Mental Status: Thought organized, appropriate affect, engaged in conversation    DIAGNOSTIC STUDIES:  I have reviewed all pertinent diagnostic studies, including:  Laboratory results:  Lab Results   Component Value Date    WBC 8.9 07/24/2021    HGB 15.3 07/24/2021    MCV 85 07/24/2021    PLT 242 07/24/2021    Lab Results   Component Value Date    NA 133 (L) 07/24/2021    K 4.8 07/24/2021    CL 95 (L) 07/24/2021    CREATININE 1.15 07/24/2021    GLU 159 (H) 09/17/2020    CALCIUM 10.3 (H) 07/24/2021      Lab Results   Component Value Date    ALBUMIN 4.2 09/17/2020    AST 32 07/24/2021    ALT 53 (H) 07/24/2021    ALKPHOS 89 07/24/2021    BILITOT 0.3 07/24/2021    Lab Results   Component Value Date    INR 1.19 10/12/2018  Lab Results   Component Value Date    TACROLIMUS 6.0 07/24/2021     09/2020 liver biopsy  A: Allograft liver, status post orthotopic liver transplant (09/16/2018), core needle biopsy:  - Severe steatosis (75%) with focal pericellular/perisinusoidal fibrosis  - Focal mild mixed portal inflammation with ductitis, compatible with focal mild cellular rejection  - Mildly increased intrahepatocellular iron deposition, grade 1 on a scale of 0-4  - PAS-diastase stain negative for alpha-1-antitrypsin-like globules

## 2021-08-27 NOTE — Progress Notes (Signed)
**Note [name]-Identified via Obfuscation** Discharge Progress Report  Patient Details  Name: [name] [name] [name] MRN: [id number] Date of Birth: 02-May-1960 Referring Provider:   Flowsheet Row CARDIAC REHAB PHASE II ORIENTATION from 07/03/2021 in [place]  Referring Provider Dorris Carnes, MD        Number of Visits: 22  Reason for Discharge:  Patient reached a stable level of exercise. Patient independent in their exercise. Patient has met [name] [name] and personal goals.  Smoking History:  Social History   Tobacco Use  Smoking Status Never   Passive exposure: Past  Smokeless Tobacco Never    Diagnosis:  04/03/21 S/P CABG x 1  04/03/21 S/P AVR (aortic valve replacement)  ADL UCSD:   Initial Exercise Prescription:  Initial Exercise Prescription - 07/03/21 1000       Date of Initial Exercise RX and Referring Provider   Date 07/03/21    Referring Provider Dorris Carnes, MD    Expected Discharge Date 08/29/21      Recumbant Bike   Level 2    RPM 60    Minutes 15    METs 2.5      T5 Nustep   Level 2    SPM 80    Minutes 15    METs 2.5      Prescription Details   Frequency (times per week) 3    Duration Progress to 30 minutes of continuous aerobic without signs/symptoms of physical distress      Intensity   THRR 40-80% of Max Heartrate 65-129    Ratings of Perceived Exertion 11-13    Perceived Dyspnea 0-4      Progression   Progression Continue progressive overload as per policy without signs/symptoms or physical distress.      Resistance Training   Training Prescription Yes    Weight 4 lbs    Reps 10-15             Discharge Exercise Prescription (Final Exercise Prescription Changes):  Exercise Prescription Changes - 08/29/21 1600       Response to Exercise   Blood Pressure (Admit) 119/82    Blood Pressure (Exercise) 140/70    Blood Pressure (Exit) 124/78    Heart Rate (Admit) 69 bpm    Heart Rate (Exercise) 91 bpm    Heart Rate (Exit) 72 bpm    Rating of  Perceived Exertion (Exercise) 13    Symptoms None    Comments Pt graduated from [name] CRP2 today    Duration Continue with 30 min of aerobic exercise without signs/symptoms of physical distress.    Intensity THRR unchanged      Progression   Progression Continue to progress workloads to maintain intensity without signs/symptoms of physical distress.    Average METs 3.1      Resistance Training   Training Prescription Yes    Weight 5 lb wts    Reps 10-15    Time 10 Minutes      Interval Training   Interval Training No      Recumbant Bike   Level 4    Minutes 15    METs 3.1      T5 Nustep   Level 4    Minutes 15    METs 3      Home Exercise Plan   Plans to continue exercise at Home (comment)    Frequency Add 2 additional days to [name] [name] exercise sessions.    Initial Home Exercises Provided 08/08/21  Functional Capacity:  6 Minute Walk     Row Name 07/03/21 1035 08/22/21 0936       6 Minute Walk   Phase Initial Discharge    Distance 1536 feet 1800 feet    Distance % Change -- 17.19 %    Distance Feet Change -- 264 ft    Walk Time 6 minutes 6 minutes    # of Rest Breaks 0 0    MPH 2.91 3.41    METS 3.71 3.74    RPE 11 11    Perceived Dyspnea  0 0    VO2 Peak 13 13.1    Symptoms Yes (comment) No    Comments Lighthededness --    Resting HR 67 bpm 66 bpm    Resting BP 142/84 120/70    Resting Oxygen Saturation  100 % 99 %    Exercise Oxygen Saturation  during 6 min walk 98 % 98 %    Max Ex. HR 76 bpm 89 bpm    Max Ex. BP 156/80 144/80    2 Minute Post BP 140/80 122/84             Psychological, QOL, Others - Outcomes: PHQ 2/9:    08/27/2021   10:13 AM 07/03/2021    3:05 PM 11/18/2020    9:03 AM 01/19/2017   10:52 AM  Depression screen PHQ 2/9  Decreased Interest 0 0 0 0  Down, Depressed, Hopeless 0 0 0 0  PHQ - 2 Score 0 0 0 0  Altered sleeping   0   Tired, decreased energy   0   Change in appetite   0   Feeling bad or failure  about yourself    0   Trouble concentrating   0   Moving slowly or fidgety/restless   0   Suicidal thoughts   0   PHQ-9 Score   0   Difficult doing work/chores   Not difficult at all     Quality of Life:  Quality of Life - 08/25/21 1421       Quality of Life   Select Quality of Life      Quality of Life Scores   Health/Function Pre 20.4 %    Health/Function Post 21.6 %    Health/Function % Change 5.88 %    Socioeconomic Pre 21.81 %    Socioeconomic Post 24.43 %    Socioeconomic % Change  12.01 %    Psych/Spiritual Pre 26.57 %    Psych/Spiritual Post 26.57 %    Psych/Spiritual % Change 0 %    Family Pre 22.8 %    Family Post 26.4 %    Family % Change 15.79 %    GLOBAL Pre 22.3 %    GLOBAL Post 23.91 %    GLOBAL % Change 7.22 %             Personal Goals: Goals established at orientation with interventions provided to work toward goal.  Personal Goals and Risk Factors at Admission - 07/03/21 1033       Core Components/Risk Factors/Patient Goals on Admission    Weight Management Yes;Obesity;Weight Loss    Intervention Weight Management: Develop a combined nutrition and exercise [name] [name] designed to reach desired caloric intake, while maintaining appropriate intake of nutrient and fiber, sodium and fats, and appropriate energy expenditure required for [name] weight goal.;Weight Management: Provide education and appropriate resources to help participant work on and attain dietary goals.;Weight Management/Obesity: Establish reasonable short term  and long term weight goals.;Obesity: Provide education and appropriate resources to help participant work on and attain dietary goals.    Admit Weight 203 lb 14.8 oz (92.5 kg)    Expected Outcomes Short Term: Continue to assess and modify interventions until short term weight is achieved;Long Term: Adherence to nutrition and physical activity/exercise [name] [name] aimed toward attainment of established weight goal;Weight Maintenance:  Understanding of [name] daily nutrition guidelines, which includes 25-35% calories from fat, 7% or less cal from saturated fats, less than 27m cholesterol, less than 1.5gm of sodium, & 5 or more servings of fruits and vegetables daily;Weight Loss: Understanding of general recommendations for a balanced deficit meal plan, which promotes 1-2 lb weight loss per week and includes a negative energy balance of [phone] kcal/d;Understanding recommendations for meals to include 15-35% energy as protein, 25-35% energy from fat, 35-60% energy from carbohydrates, less than 2052mof dietary cholesterol, 20-35 gm of total fiber daily;Understanding of distribution of calorie intake throughout [name] day with [name] consumption of 4-5 meals/snacks    Diabetes Yes    Intervention Provide education about signs/symptoms and action to take for hypo/hyperglycemia.;Provide education about proper nutrition, including hydration, and aerobic/resistive exercise prescription along with prescribed medications to achieve blood glucose in normal ranges: Fasting glucose 65-99 mg/dL    Expected Outcomes Short Term: Participant verbalizes understanding of [name] signs/symptoms and immediate care of hyper/hypoglycemia, proper foot care and importance of medication, aerobic/resistive exercise and nutrition plan for blood glucose control.;Long Term: Attainment of HbA1C < 7%.    Hypertension Yes    Intervention Provide education on lifestyle modifcations including regular physical activity/exercise, weight management, moderate sodium restriction and increased consumption of fresh fruit, vegetables, and low fat dairy, alcohol moderation, and smoking cessation.;Monitor prescription use compliance.    Expected Outcomes Short Term: Continued assessment and intervention until BP is < 140/9050mG in hypertensive participants. < 130/41m38m in hypertensive participants with diabetes, heart failure or chronic kidney disease.;Long Term: Maintenance of blood  pressure at goal levels.    Lipids Yes    Intervention Provide education and support for participant on nutrition & aerobic/resistive exercise along with prescribed medications to achieve LDL <70mg50mL >40mg.65mExpected Outcomes Short Term: Participant states understanding of desired cholesterol values and is compliant with medications prescribed. Participant is following exercise prescription and nutrition guidelines.;Long Term: Cholesterol controlled with medications as prescribed, with individualized exercise RX and with personalized nutrition plan. Value goals: LDL < 70mg, 89m> 40 mg.    Stress Yes    Intervention Offer individual and/or small group education and counseling on adjustment to heart disease, stress management and health-related lifestyle change. Teach and support self-help strategies.;Refer participants experiencing significant psychosocial distress to appropriate mental health specialists for further evaluation and treatment. When possible, include family members and significant others in education/counseling sessions.    Expected Outcomes Short Term: Participant demonstrates changes in health-related behavior, relaxation and other stress management skills, ability to obtain effective social support, and compliance with psychotropic medications if prescribed.;Long Term: Emotional wellbeing is indicated by absence of clinically significant psychosocial distress or social isolation.              Personal Goals Discharge:  Goals and Risk Factor Review     Row Name 07/29/21 0803 08/20/21 1701           Core Components/Risk Factors/Patient Goals Review   Personal Goals Review Weight Management/Obesity;Stress;Hypertension;Lipids;Diabetes Weight Management/Obesity;Stress;Hypertension;Lipids;Diabetes      Review [name] [name] [name]  doing  well with exercise, vital signs and CBG's have been stable. [name] [name] [name] has been  doing well with exercise, vital signs and CBG's have been stable.  [name] [name] [name] will complete phase 2 cardiac rehab on 08/29/21      Expected Outcomes [name] [name] [name] will continue to participate in phase 2 cardiac rehab for exercise, nutrition and lifestyle modifications [name] [name] [name] will continue to participate in phase 2 cardiac rehab for exercise, nutrition and lifestyle modifications               Exercise Goals and Review:  Exercise Goals     Row Name 07/03/21 1037             Exercise Goals   Increase Physical Activity Yes       Intervention Provide advice, education, support and counseling about physical activity/exercise needs.;Develop an individualized exercise prescription for aerobic and resistive training based on initial evaluation findings, risk stratification, comorbidities and participant's personal goals.       Expected Outcomes Short Term: Attend rehab on a regular basis to increase amount of physical activity.;Long Term: Add in home exercise to make exercise part of routine and to increase amount of physical activity.;Long Term: Exercising regularly at least 3-5 days a week.       Increase Strength and Stamina Yes       Intervention Provide advice, education, support and counseling about physical activity/exercise needs.;Develop an individualized exercise prescription for aerobic and resistive training based on initial evaluation findings, risk stratification, comorbidities and participant's personal goals.       Expected Outcomes Short Term: Increase workloads from initial exercise prescription for resistance, speed, and METs.;Short Term: Perform resistance training exercises routinely during rehab and add in resistance training at home;Long Term: Improve cardiorespiratory fitness, muscular endurance and strength as measured by increased METs and functional capacity (6MWT)       Able to understand and use rate of perceived exertion (RPE) scale Yes       Intervention Provide education and explanation on how to use RPE scale       Expected Outcomes Short  Term: Able to use RPE daily in rehab to express subjective intensity level;Long Term:  Able to use RPE to guide intensity level when exercising independently       Knowledge and understanding of Target Heart Rate Range (THRR) Yes       Intervention Provide education and explanation of THRR including how [name] numbers were predicted and where they are located for reference       Expected Outcomes Short Term: Able to state/look up THRR;Short Term: Able to use daily as guideline for intensity in rehab;Long Term: Able to use THRR to govern intensity when exercising independently       Understanding of Exercise Prescription Yes       Intervention Provide education, explanation, and written materials on patient's individual exercise prescription       Expected Outcomes Short Term: Able to explain [name] [name] exercise prescription;Long Term: Able to explain home exercise prescription to exercise independently                Exercise Goals Re-Evaluation:  Exercise Goals Re-Evaluation     Row Name 07/07/21 0954 08/04/21 1546 08/29/21 1620         Exercise Goal Re-Evaluation   Exercise Goals Review Increase Physical Activity;Increase Strength and Stamina;Able to understand and use rate of perceived exertion (RPE) scale;Knowledge and understanding of Target Heart Rate Range (THRR);Understanding of Exercise Prescription Increase Physical Activity;Increase Strength and Stamina;Able  to understand and use rate of perceived exertion (RPE) scale;Knowledge and understanding of Target Heart Rate Range (THRR);Understanding of Exercise Prescription Increase Physical Activity;Increase Strength and Stamina;Able to understand and use rate of perceived exertion (RPE) scale;Knowledge and understanding of Target Heart Rate Range (THRR);Understanding of Exercise Prescription     Comments Pt's first day in [name] CRP2 [name] [name]. Pt understnads [name] exercise Rx, RPE scale and THRR. Reviewed METs and goals. Pt voices he has improved  strength and stamina which was his short term goal. Pt still has some SOB and that has not improved as much as he would like, which was also a goal. Pt graduated form [name] [name] [name] [name] today. Pt had a peak MEt level of 3.3. Pt plans to continue his exercise at [name] [name] [name] [name]. 5-7x/week for 30 minutes encouraged. Pt voiced he wouldd also supplement his exercise with walking at home.     Expected Outcomes Will continue to monitor patient and progress exercise workloads as tolerated. Will continue to monitor patient and progress exercise workloads as tolerated. Pt will continue to exercise on his own at [name] [name] [name].              Nutrition & Weight - Outcomes:  Pre Biometrics - 07/03/21 0900       Pre Biometrics   Waist Circumference 46 inches    Hip Circumference 39.5 inches    Waist to Hip Ratio 1.16 %    Triceps Skinfold 10 mm    % Body Fat 29.2 %    Grip Strength 44 kg    Flexibility 12.5 in    Single Leg Stand 30 seconds             Post Biometrics - 08/25/21 1412        Post  Biometrics   Height 5' 9.25" (1.759 m)    Weight 92 kg    Waist Circumference 46 inches    Hip Circumference 40 inches    Waist to Hip Ratio 1.15 %    BMI (Calculated) 29.73    Triceps Skinfold 9 mm    % Body Fat 28.7 %    Grip Strength 44 kg    Flexibility 14 in    Single Leg Stand 30 seconds             Nutrition:  Nutrition Therapy & Goals - 08/29/21 1112       Nutrition Therapy   Diet Heart Healthy/ Carbohydrate Consistent    Drug/Food Interactions Statins/Certain Fruits      Personal Nutrition Goals   Nutrition Goal Patient to implement lean protein/plant protein, fruits, vegetables, whole grains, and low fat dairy as part of a heart healthy meal plan.   in progress   Personal Goal #2 Patient to limit food sources of saturated fat, trans fat, sodium and refined carbohydrates.   in progress   Personal Goal #3 Patient to reduce sodium intake to <2300 mg per day.    Comments Patient  continues to make progress with dietary changes. Patient graduates today. He has attended multipled [name] [name] [name] and reports improved nutrition knowledge. Dietary changes remains inconsistent; he continues eating out regularly and over snacking on carbohydrates at night.      Intervention Plan   Intervention Prescribe, educate and counsel regarding individualized specific dietary modifications aiming towards targeted core components such as weight, hypertension, lipid management, diabetes, heart failure and other comorbidities.    Expected Outcomes Short Term Goal: Understand basic principles of dietary content, such as calories,  fat, sodium, cholesterol and nutrients.;Long Term Goal: Adherence to prescribed nutrition plan.             Nutrition Discharge:  Nutrition Assessments - 08/25/21 1454       Rate Your Plate Scores   Post Score 56             Education Questionnaire Score:  Knowledge Questionnaire Score - 08/25/21 1415       Knowledge Questionnaire Score   Post Score 22/24             Goals reviewed with patient; copy given to patient.Pt graduates from  Intensive/Traditional cardiac rehab [name] [name] today with completion of  22  exercise and education sessions. Pt maintained good attendance and progressed nicely during their participation in rehab as evidenced by increased MET level.   Medication list reconciled. Repeat  PHQ score- 0 .  Pt has made significant lifestyle changes and should be commended for their success.  [name] [name] [name]  achieved his goals during cardiac rehab.   Pt plans to continue exercise at [name] [name] [name] [name]. [name] [name] [name] increased his distance on his post exercise walk test by 264 feet. We are proud of [name] [name] [name]'s progress! Harrell Gave RN BSN

## 2021-08-28 ENCOUNTER — Ambulatory Visit: Admit: 2021-08-28 | Discharge: 2021-08-28 | Payer: MEDICARE

## 2021-08-28 DIAGNOSIS — Z8505 Personal history of malignant neoplasm of liver: Secondary | ICD-10-CM | POA: Diagnosis not present

## 2021-08-28 DIAGNOSIS — Z8616 Personal history of COVID-19: Secondary | ICD-10-CM | POA: Diagnosis not present

## 2021-08-28 DIAGNOSIS — K760 Fatty (change of) liver, not elsewhere classified: Secondary | ICD-10-CM | POA: Diagnosis not present

## 2021-08-28 DIAGNOSIS — C220 Liver cell carcinoma: Secondary | ICD-10-CM | POA: Diagnosis not present

## 2021-08-28 DIAGNOSIS — Z944 Liver transplant status: Secondary | ICD-10-CM | POA: Diagnosis not present

## 2021-08-28 DIAGNOSIS — R748 Abnormal levels of other serum enzymes: Secondary | ICD-10-CM | POA: Diagnosis not present

## 2021-08-28 DIAGNOSIS — D849 Immunodeficiency, unspecified: Secondary | ICD-10-CM | POA: Diagnosis not present

## 2021-08-28 DIAGNOSIS — D8489 Other immunodeficiencies: Secondary | ICD-10-CM | POA: Diagnosis not present

## 2021-08-28 DIAGNOSIS — Z79621 Long term (current) use of calcineurin inhibitor: Secondary | ICD-10-CM | POA: Diagnosis not present

## 2021-08-28 DIAGNOSIS — Z7983 Long term (current) use of bisphosphonates: Secondary | ICD-10-CM | POA: Diagnosis not present

## 2021-08-28 DIAGNOSIS — E119 Type 2 diabetes mellitus without complications: Secondary | ICD-10-CM | POA: Diagnosis not present

## 2021-08-28 DIAGNOSIS — Z5181 Encounter for therapeutic drug level monitoring: Secondary | ICD-10-CM | POA: Diagnosis not present

## 2021-08-28 DIAGNOSIS — Z23 Encounter for immunization: Secondary | ICD-10-CM | POA: Diagnosis not present

## 2021-08-28 DIAGNOSIS — Z951 Presence of aortocoronary bypass graft: Secondary | ICD-10-CM | POA: Diagnosis not present

## 2021-08-28 DIAGNOSIS — Z952 Presence of prosthetic heart valve: Secondary | ICD-10-CM | POA: Diagnosis not present

## 2021-08-28 DIAGNOSIS — Z7984 Long term (current) use of oral hypoglycemic drugs: Secondary | ICD-10-CM | POA: Diagnosis not present

## 2021-08-28 DIAGNOSIS — Z7969 Long term (current) use of other immunomodulators and immunosuppressants: Secondary | ICD-10-CM | POA: Diagnosis not present

## 2021-08-28 DIAGNOSIS — E785 Hyperlipidemia, unspecified: Secondary | ICD-10-CM | POA: Diagnosis not present

## 2021-08-28 DIAGNOSIS — I10 Essential (primary) hypertension: Secondary | ICD-10-CM | POA: Diagnosis not present

## 2021-08-28 DIAGNOSIS — I77819 Aortic ectasia, unspecified site: Secondary | ICD-10-CM | POA: Diagnosis not present

## 2021-08-28 DIAGNOSIS — R0602 Shortness of breath: Secondary | ICD-10-CM | POA: Diagnosis not present

## 2021-08-28 DIAGNOSIS — I350 Nonrheumatic aortic (valve) stenosis: Secondary | ICD-10-CM | POA: Diagnosis not present

## 2021-08-28 DIAGNOSIS — I77810 Thoracic aortic ectasia: Secondary | ICD-10-CM | POA: Diagnosis not present

## 2021-08-28 DIAGNOSIS — E612 Magnesium deficiency: Secondary | ICD-10-CM | POA: Diagnosis not present

## 2021-08-28 DIAGNOSIS — M8580 Other specified disorders of bone density and structure, unspecified site: Secondary | ICD-10-CM | POA: Diagnosis not present

## 2021-08-28 LAB — CBC W/ AUTO DIFF
BASOPHILS ABSOLUTE COUNT: 0.1 10*9/L (ref 0.0–0.1)
BASOPHILS RELATIVE PERCENT: 1.2 %
EOSINOPHILS ABSOLUTE COUNT: 0.3 10*9/L (ref 0.0–0.5)
EOSINOPHILS RELATIVE PERCENT: 4 %
HEMATOCRIT: 41.7 % (ref 39.0–48.0)
HEMOGLOBIN: 14.4 g/dL (ref 12.9–16.5)
LYMPHOCYTES ABSOLUTE COUNT: 2 10*9/L (ref 1.1–3.6)
LYMPHOCYTES RELATIVE PERCENT: 22.9 %
MEAN CORPUSCULAR HEMOGLOBIN CONC: 34.5 g/dL (ref 32.0–36.0)
MEAN CORPUSCULAR HEMOGLOBIN: 29.1 pg (ref 25.9–32.4)
MEAN CORPUSCULAR VOLUME: 84.5 fL (ref 77.6–95.7)
MEAN PLATELET VOLUME: 7.5 fL (ref 6.8–10.7)
MONOCYTES ABSOLUTE COUNT: 0.8 10*9/L (ref 0.3–0.8)
MONOCYTES RELATIVE PERCENT: 8.6 %
NEUTROPHILS ABSOLUTE COUNT: 5.6 10*9/L (ref 1.8–7.8)
NEUTROPHILS RELATIVE PERCENT: 63.3 %
PLATELET COUNT: 223 10*9/L (ref 150–450)
RED BLOOD CELL COUNT: 4.93 10*12/L (ref 4.26–5.60)
RED CELL DISTRIBUTION WIDTH: 16.2 % — ABNORMAL HIGH (ref 12.2–15.2)
WBC ADJUSTED: 8.8 10*9/L (ref 3.6–11.2)

## 2021-08-28 LAB — COMPREHENSIVE METABOLIC PANEL
ALBUMIN: 4.2 g/dL (ref 3.4–5.0)
ALKALINE PHOSPHATASE: 79 U/L (ref 46–116)
ALT (SGPT): 72 U/L — ABNORMAL HIGH (ref 10–49)
ANION GAP: 8 mmol/L (ref 5–14)
AST (SGOT): 39 U/L — ABNORMAL HIGH (ref <=34)
BILIRUBIN TOTAL: 0.4 mg/dL (ref 0.3–1.2)
BLOOD UREA NITROGEN: 29 mg/dL — ABNORMAL HIGH (ref 9–23)
BUN / CREAT RATIO: 25
CALCIUM: 9.1 mg/dL (ref 8.7–10.4)
CHLORIDE: 104 mmol/L (ref 98–107)
CO2: 26 mmol/L (ref 20.0–31.0)
CREATININE: 1.18 mg/dL — ABNORMAL HIGH
EGFR CKD-EPI (2021) MALE: 71 mL/min/{1.73_m2} (ref >=60)
GLUCOSE RANDOM: 140 mg/dL — ABNORMAL HIGH (ref 70–99)
POTASSIUM: 4.9 mmol/L (ref 3.5–5.1)
PROTEIN TOTAL: 7.3 g/dL (ref 5.7–8.2)
SODIUM: 138 mmol/L (ref 135–145)

## 2021-08-28 LAB — BILIRUBIN, DIRECT: BILIRUBIN DIRECT: 0.1 mg/dL (ref 0.00–0.30)

## 2021-08-28 LAB — MAGNESIUM: MAGNESIUM: 1.4 mg/dL — ABNORMAL LOW (ref 1.6–2.6)

## 2021-08-28 LAB — TACROLIMUS LEVEL, TROUGH: TACROLIMUS, TROUGH: 5.1 ng/mL (ref 5.0–15.0)

## 2021-08-28 LAB — PHOSPHORUS: PHOSPHORUS: 3.3 mg/dL (ref 2.4–5.1)

## 2021-08-28 LAB — GAMMA GT: GAMMA GLUTAMYL TRANSFERASE: 64 U/L

## 2021-08-28 LAB — AFP TUMOR MARKER: AFP-TUMOR MARKER: <2 ng/mL (ref <=8)

## 2021-08-28 MED ADMIN — gadobenate dimeglumine (MULTIHANCE) 529 mg/mL (0.1mmol/0.2mL) solution 9.5 mL: 9.5 mL | INTRAVENOUS | @ 13:00:00 | Stop: 2021-08-28

## 2021-08-28 MED ADMIN — LORazepam (ATIVAN) injection 1 mg: 1 mg | INTRAVENOUS | @ 13:00:00 | Stop: 2021-08-28

## 2021-08-28 NOTE — Unmapped (Signed)
Per provider, the patient received Hepatitis A and Hepatitis B vaccines.  Patient ID verified with name and date of birth.  All screening questions were answered.  Vaccine(s) were administered as ordered.  See immunization history for documentation.  Patient tolerated the injection(s) well with no issues noted.  Vaccine Information sheets given to the patient.

## 2021-08-28 NOTE — Unmapped (Addendum)
Havrix (hepatitis A) vaccine due in 6 month  Heplisav (hepatitis B) in one month, and a third in 6 months

## 2021-08-28 NOTE — Unmapped (Signed)
Patient seen in clinic today for his annual liver transplant follow up. He reports he is doing well since his heart valve replacement and bypass surgery in Feb this year. He is currently in cardiac rehab, finishing up tomorrow. He denies n/v/d/constipaton/LE swelling or hernia issues. He denies any change in his experience of SOB, but it persists despite cardiac surgery. Nothing relevant to this noted on CT, which was reviewed by NP Harms. However, she will forward report to Dr. Tenny Craw, which noted aortic ectasia. MRI read, for Retreat Score 1,  pending at this time. Patient refrains from tobacco, etoh, and illicit drugs. He is hydrating appropriately with caffeine-free fluids, and his weight has been stable over the last year. He does have trunkal obesity, June '23 Hgb A1c from [facility] was 7.6, and past MRI noted fatty liver. Patient is willing to come to [facility] for medical wt loss, so referral placed today. Spent about 15 min on post-liver transplant patient education. Patient has been seen this year by dermatology and had f/up with his PCP, Dr.Duncan, who manages his bone health. He is due for colonoscopy next year. Since he tested HBV ('21) and HAV ('19) non-immune, both vaccines were given in clinic today with instructions on when to repeat 2nd vaccines in their series. Patient's liver enzymes mildly elevated today, so per NP Harms, he should repeat in 1 mo. Patient verbalized understanding of all discussed and agreed to contact [facility] if he is not contacted by endocrine office at the end of 2 weeks.

## 2021-08-29 ENCOUNTER — Encounter (HOSPITAL_COMMUNITY)
Admission: RE | Admit: 2021-08-29 | Discharge: 2021-08-29 | Disposition: A | Discharge status: Home / Self Care | Payer: Medicare Other | Source: Ambulatory Visit | Attending: Internal Medicine | Admitting: Internal Medicine

## 2021-08-29 ENCOUNTER — Telehealth: Payer: Self-pay | Admitting: Internal Medicine

## 2021-08-29 DIAGNOSIS — Z48812 Encounter for surgical aftercare following surgery on the circulatory system: Secondary | ICD-10-CM | POA: Diagnosis not present

## 2021-08-29 DIAGNOSIS — Z951 Presence of aortocoronary bypass graft: Secondary | ICD-10-CM

## 2021-08-29 DIAGNOSIS — R7989 Other specified abnormal findings of blood chemistry: Secondary | ICD-10-CM

## 2021-08-29 DIAGNOSIS — Z952 Presence of prosthetic heart valve: Secondary | ICD-10-CM

## 2021-08-29 MED ORDER — LEVOTHYROXINE SODIUM 175 MCG PO TABS: 175.0000 ug | ORAL_TABLET | Freq: Every day | ORAL | 3 refills | Status: DC

## 2021-08-29 MED ORDER — LEVOTHYROXINE 175 MCG TABLET
ORAL_TABLET | Freq: Every day | ORAL | 3 refills | 90 days
Start: 2021-08-29 — End: ?

## 2021-08-29 NOTE — Telephone Encounter (Unsigned)
The patient has been notified of the result and verbalized understanding.  Updated Rx synthroid 175 mcg QD sent to preferred pharmacy. Repeat TSH scheduled for 9/7. All questions (if any) were answered. Cleon Gustin, RN 08/29/2021 11:31 AM

## 2021-08-29 NOTE — Telephone Encounter (Signed)
  Pt is returning call for his lab result. He gave phone# [phone]

## 2021-09-02 NOTE — Unmapped (Signed)
[facility] Refill Coordination Note    Specialty Medication(s) to be Shipped:   Transplant: mycophenolate mofetil 250mg  and tacrolimus 1mg     Other medication(s) to be shipped: No additional medications requested for fill at this time     Anthony Alvarado, DOB: 12/18/1960  Phone: [phone] (home)       All above HIPAA information was verified with patient.     Was a Nurse, learning disability used for this call? No    Completed refill call assessment today to schedule patient's medication shipment from the [facility] [phone]).  All relevant notes have been reviewed.     Specialty medication(s) and dose(s) confirmed: Regimen is correct and unchanged.   Changes to medications: Anthony Alvarado reports no changes at this time.  Changes to insurance: No  New side effects reported not previously addressed with a pharmacist or physician: None reported  Questions for the pharmacist: No    Confirmed patient received a Conservation officer, historic buildings and a Surveyor, mining with first shipment. The patient will receive a drug information handout for each medication shipped and additional FDA Medication Guides as required.       DISEASE/MEDICATION-SPECIFIC INFORMATION        N/A    SPECIALTY MEDICATION ADHERENCE     Medication Adherence    Patient reported X missed doses in the last month: 0  Specialty Medication: tacrolimus 1 mg  Patient is on additional specialty medications: Yes  Additional Specialty Medications: Mycophenolate 250 mg   Patient Reported Additional Medication X Missed Doses in the Last Month: 0  Patient is on more than two specialty medications: No              Were doses missed due to medication being on hold? No    Tacrolimus  1 mg: 10 days of medicine on hand   Mycophenolate  250 mg: 2 days of medicine on hand       REFERRAL TO PHARMACIST     Referral to the pharmacist: Not needed      [facility]     Shipping address confirmed in Epic.     Delivery Scheduled: Yes, Expected medication delivery date: 09/03/21.     Medication will be delivered via UPS to the prescription address in Epic WAM.    Anthony Alvarado   [facility] Specialty Technician

## 2021-09-03 DIAGNOSIS — G4733 Obstructive sleep apnea (adult) (pediatric): Secondary | ICD-10-CM | POA: Diagnosis not present

## 2021-09-03 MED FILL — MYCOPHENOLATE MOFETIL 250 MG CAPSULE: ORAL | 30 days supply | Qty: 60 | Fill #1

## 2021-09-03 MED FILL — TACROLIMUS 1 MG CAPSULE, IMMEDIATE-RELEASE: ORAL | 30 days supply | Qty: 120 | Fill #2

## 2021-09-05 DIAGNOSIS — R935 Abnormal findings on diagnostic imaging of other abdominal regions, including retroperitoneum: Principal | ICD-10-CM

## 2021-09-05 DIAGNOSIS — C220 Liver cell carcinoma: Principal | ICD-10-CM

## 2021-09-05 DIAGNOSIS — Z944 Liver transplant status: Principal | ICD-10-CM

## 2021-09-05 NOTE — Unmapped (Signed)
Patient's 7/6 HCC imaging reviewed by NP Harms, who recommended no further CT imaging, but repeat Abd MRi in 6 mos for f/up on area of hyperenhancement in segment 5/6. Contacted patient and discussed recommendations/results with him, including findings of fatty liver and need for repeat MRI. Explained that wt management clinic at [facility] had a long wait time, so suggested he speak to his pcp about this or obtain a local provider for this focus. He verbalized understanding.

## 2021-09-06 DIAGNOSIS — G4733 Obstructive sleep apnea (adult) (pediatric): Secondary | ICD-10-CM | POA: Diagnosis not present

## 2021-09-19 ENCOUNTER — Ambulatory Visit (INDEPENDENT_AMBULATORY_CARE_PROVIDER_SITE_OTHER): Payer: Medicare Other | Admitting: Family Medicine

## 2021-09-19 ENCOUNTER — Encounter: Payer: Self-pay | Admitting: Family Medicine

## 2021-09-19 VITALS — BP 128/70 | HR 75 | Temp 97.7°F | Wt 207.0 lb

## 2021-09-19 DIAGNOSIS — E039 Hypothyroidism, unspecified: Secondary | ICD-10-CM

## 2021-09-19 DIAGNOSIS — M7050 Other bursitis of knee, unspecified knee: Secondary | ICD-10-CM

## 2021-09-19 DIAGNOSIS — E119 Type 2 diabetes mellitus without complications: Secondary | ICD-10-CM

## 2021-09-19 NOTE — Progress Notes (Unsigned)
Diabetes:  Using medications without difficulties:Needs help paying for jardiance.   Hypoglycemic episodes:no Hyperglycemic episodes:no Feet problems:no Blood Sugars averaging: 138 this AM, 120-200 eye exam within last year: due this November, d/w pt.   Last A1c d/w pt.   D/w pt about endo eval.   He had been off jardiance in the meantime.    TSH was elevated and levothyroxine was increased to 137mg.    L knee puffy "like a pack of jelly" on the front.  Not really painful.  No trauma.  No redness or bruising.    H/o pulmonary embolism noted, done with anticoagulation.    Meds, vitals, and allergies reviewed.   ROS: Per HPI unless specifically indicated in ROS section   GEN: nad, alert and oriented HEENT: ncat NECK: supple w/o LA CV: rrr. PULM: ctab, no inc wob ABD: soft, +bs EXT: no edema SKIN: no acute rash

## 2021-09-19 NOTE — Patient Instructions (Addendum)
Go to the lab on the way out.   If you have mychart we'll likely use that to update you.    Let me check on options for jardiance.   Take care.  Glad to see you. I would try icing your knee for 5 minutes at a time.  Likely infrapatellar bursitis.

## 2021-09-20 LAB — TSH: TSH: 4.09 mIU/L (ref 0.40–4.50)

## 2021-09-21 DIAGNOSIS — M7050 Other bursitis of knee, unspecified knee: Secondary | ICD-10-CM | POA: Insufficient documentation

## 2021-09-21 NOTE — Assessment & Plan Note (Signed)
Reasonable to see when he will be able to get in with endocrinology.  Referral placed.  I will ask for pharmacy input on Jardiance.  Continue metformin in the meantime.  It may be that getting back on Jardiance is all he needs as that would likely improve his sugars.  Continue work on diet.  Discussed.

## 2021-09-21 NOTE — Assessment & Plan Note (Signed)
He is not having pain and it does not look infected so I think it would be reasonable not to aspirate the bursa now.  Routine cautions given to patient and rationale discussed.  He agreed with plan.  He can try icing as needed and update me as needed in the future.

## 2021-09-21 NOTE — Assessment & Plan Note (Signed)
Recheck TSH pending.

## 2021-09-24 ENCOUNTER — Telehealth: Payer: Self-pay | Admitting: Family Medicine

## 2021-09-24 NOTE — Chronic Care Management (AMB) (Signed)
  Chronic Care Management   Outreach Note  09/24/2021 Name: VALLEN CALABRESE MRN: [id number] DOB: [birth year]  Referred by: [name], MD Reason for referral : No chief complaint on file.   An unsuccessful telephone outreach was attempted today. The patient was referred to the pharmacist for assistance with care management and care coordination.   Follow Up Plan:   Tatjana Dellinger Upstream Scheduler

## 2021-09-25 ENCOUNTER — Other Ambulatory Visit: Payer: Self-pay | Admitting: Family Medicine

## 2021-09-25 MED ORDER — SERTRALINE 50 MG TABLET
ORAL_TABLET | Freq: Every day | ORAL | 3 refills | 90.00000 days
Start: 2021-09-25 — End: ?

## 2021-09-25 MED ORDER — METFORMIN ER 500 MG TABLET,EXTENDED RELEASE 24 HR
ORAL_TABLET | Freq: Two times a day (BID) | ORAL | 3 refills | 90 days
Start: 2021-09-25 — End: ?

## 2021-09-25 NOTE — Unmapped (Signed)
[facility] Refill Coordination Note    Specialty Medication(s) to be Shipped:   Transplant: mycophenolate mofetil 250mg  and tacrolimus 1mg     Other medication(s) to be shipped:  metformin , pravastatin , and sertraline      Anthony Alvarado, DOB: July 31, 1960  Phone: [phone] (home)       All above HIPAA information was verified with patient.     Was a Nurse, learning disability used for this call? No    Completed refill call assessment today to schedule patient's medication shipment from the [facility] [phone]).  All relevant notes have been reviewed.     Specialty medication(s) and dose(s) confirmed: Regimen is correct and unchanged.   Changes to medications: [name] reports no changes at this time.  Changes to insurance: No  New side effects reported not previously addressed with a pharmacist or physician: None reported  Questions for the pharmacist: No    Confirmed patient received a Conservation officer, historic buildings and a Surveyor, mining with first shipment. The patient will receive a drug information handout for each medication shipped and additional FDA Medication Guides as required.       DISEASE/MEDICATION-SPECIFIC INFORMATION        N/A    SPECIALTY MEDICATION ADHERENCE     Medication Adherence    Specialty Medication: mycophenolate 250 mg  Patient is on additional specialty medications: Yes  Additional Specialty Medications: Tacrolimus 1 mg   Patient Reported Additional Medication X Missed Doses in the Last Month: 0  Patient is on more than two specialty medications: No              Were doses missed due to medication being on hold? No    mycophenolate 250 mg: 8 days of medicine on hand   Tacrolimus  1 mg: 8 days of medicine on hand     REFERRAL TO PHARMACIST     Referral to the pharmacist: Not needed      [facility]     Shipping address confirmed in Epic.     Delivery Scheduled: Yes, Expected medication delivery date: 09/30/21.     Medication will be delivered via UPS to the prescription address in Epic WAM.    [name]   [facility] Specialty Technician

## 2021-09-27 ENCOUNTER — Other Ambulatory Visit: Payer: Self-pay | Admitting: Family Medicine

## 2021-09-28 NOTE — Unmapped (Signed)
TRF

## 2021-09-29 ENCOUNTER — Telehealth: Payer: Self-pay | Admitting: Family Medicine

## 2021-09-29 MED FILL — MYCOPHENOLATE MOFETIL 250 MG CAPSULE: ORAL | 30 days supply | Qty: 60 | Fill #2

## 2021-09-29 MED FILL — TACROLIMUS 1 MG CAPSULE, IMMEDIATE-RELEASE: ORAL | 30 days supply | Qty: 120 | Fill #3

## 2021-09-29 MED FILL — PRAVASTATIN 40 MG TABLET: ORAL | 90 days supply | Qty: 90 | Fill #1

## 2021-09-29 MED FILL — SERTRALINE 50 MG TABLET: ORAL | 90 days supply | Qty: 90 | Fill #0

## 2021-09-29 MED FILL — METFORMIN ER 500 MG TABLET,EXTENDED RELEASE 24 HR: ORAL | 90 days supply | Qty: 360 | Fill #0

## 2021-09-29 NOTE — Progress Notes (Signed)
  Chronic Care Management   Note  09/29/2021 Name: DARRIOUS YOUMAN MRN: [id number] DOB: 1961-02-14  ACIE CUSTIS is a 61 y.o. year old male who is a primary care patient of [name], MD. I reached out to Danelle Berry by phone today in response to a referral sent by Mr. Alfonso Patten Giammarco's PCP, [name], MD.   Mr. Delaughter was given information about Chronic Care Management services today including:  CCM service includes personalized support from designated clinical staff supervised by his physician, including individualized plan of care and coordination with other care providers 24/7 contact phone numbers for assistance for urgent and routine care needs. Service will only be billed when office clinical staff spend 20 minutes or more in a month to coordinate care. Only one practitioner may furnish and bill the service in a calendar month. The patient may stop CCM services at any time (effective at the end of the month) by phone call to the office staff.   Patient agreed to services and verbal consent obtained.   Follow up plan:REFERRAL/NO COPAY   [place]

## 2021-10-03 ENCOUNTER — Telehealth: Payer: Self-pay

## 2021-10-03 NOTE — Chronic Care Management (AMB) (Signed)
Chronic Care Management Pharmacy Assistant   Name: LATRAVION GRAVES  MRN: [id number] DOB: 26-Jan-1961  NIJAH ORLICH is an 61 y.o. year old male who presents for his initial CCM visit with the clinical pharmacist.  Reason for Encounter: Initial Questions   Conditions to be addressed/monitored: CAD, HTN, HLD, and DMII   Recent office visits:  09/19/21-Graham Duncan,MD(PCP)-knee aspiration, unable to afford jardiance, Labs(TSH normal)  Recent consult visits:  09/05/21-Laura Cramer,RN([facility] -transplant) telemedicine -referral for future imaging  08/06/21-Paula Ross,MD(cardio)-f/u  CAD,EKG,labs(CBC is normal Electrolytes:  Potassium is minimally increased  Follow  Stay hydrated   Kidney function is normalLDL is excellent   Triglycerids are high 446   Watch carbs TSH is minimall elevated Could try increasing synthroid  to 175 mg     Follow up TSH in 2 months) 04/30/21-Bryan Bartle,MD(cardio)-routine post op,no medication changes 04/23/21-Cardio- hospital f/u, labs BMET(Labs normal except K still high)  Hospital visits:  None in previous 6 months  Medications: Outpatient Encounter Medications as of 10/03/2021  Medication Sig   acetaminophen (TYLENOL) 500 MG tablet Take 500 mg by mouth every 6 (six) hours as needed for moderate pain or headache.   albuterol (VENTOLIN HFA) 108 (90 Base) MCG/ACT inhaler Inhale 2 puffs into the lungs every 6 (six) hours as needed for wheezing or shortness of breath.   alendronate (FOSAMAX) 70 MG tablet Take 1 tablet (70 mg total) by mouth every Sunday. Take with a full glass of water on an empty stomach.   aspirin EC 325 MG EC tablet Take 1 tablet (325 mg total) by mouth daily.   azelastine (ASTELIN) 0.1 % nasal spray PLACE 2 SPRAYS INTO BOTH NOSTRILS TWICE DAILY AS NEEDED   calcium carbonate (TUMS - DOSED IN MG ELEMENTAL CALCIUM) 500 MG chewable tablet Chew 1 tablet by mouth daily.   Cholecalciferol (VITAMIN D) 50 MCG (2000 UT) tablet Take 2,000 Units by mouth  daily.   empagliflozin (JARDIANCE) 10 MG TABS tablet Take 1 tablet (10 mg total) by mouth daily before breakfast.   fluticasone (FLONASE) 50 MCG/ACT nasal spray PLACE ONE OR TWO SPRAYS INTO BOTH NOSTRILS DAILY AS NEEDED.   levothyroxine (SYNTHROID) 175 MCG tablet Take 1 tablet (175 mcg total) by mouth daily before breakfast.   metFORMIN (GLUCOPHAGE-XR) 500 MG 24 hr tablet Take 2 tablets (1,000 mg total) by mouth in the morning and at bedtime.   metoprolol tartrate (LOPRESSOR) 25 MG tablet TAKE 1 TABLET BY MOUTH TWICE A DAY   metroNIDAZOLE (METROGEL) 1 % gel Apply 1 application topically daily as needed (rosacea).   mycophenolate (CELLCEPT) 250 MG capsule Take 250 mg by mouth 2 (two) times daily.   NON FORMULARY Pt uses a cpap nightly   OneTouch Delica Lancets 85I MISC USE TO CHECK SUGAR DAILY   ONETOUCH ULTRA test strip USE TO CHECK SUGAR DAILY   pravastatin (PRAVACHOL) 40 MG tablet Take 40 mg by mouth every evening.   sertraline (ZOLOFT) 50 MG tablet Take 1 tablet (50 mg total) by mouth daily.   tacrolimus (PROGRAF) 1 MG capsule Take 2 mg by mouth 2 (two) times daily.   No facility-administered encounter medications on file as of 10/03/2021.    Lab Results  Component Value Date/Time   HGBA1C 7.6 (H) 08/06/2021 08:52 AM   HGBA1C 7.4 (H) 04/01/2021 10:50 AM   HGBA1C 5.1 01/04/2018 12:00 AM   MICROALBUR 2.1 (H) 11/18/2020 09:58 AM   MICROALBUR 3.7 (H) 11/03/2019 09:58 AM     BP Readings  from Last 3 Encounters:  09/19/21 128/70  08/06/21 (!) 146/86  07/03/21 (!) 142/84    Patient contacted to confirm in office appointment with Charlene Brooke, Pharm D, on 10/06/21 at 9:30am.  Do you have any problems getting your medications? Yes If yes what types of problems are you experiencing? Financial barriers with [facility]  What is your top health concern you would like to discuss at your upcoming visit?  Medication cost and is it necessary to be on Jardiance   Have you seen any other  providers since your last visit with PCP? No  CCM referral has been placed prior to visit?  Yes   Star Rating Drugs:  Medication:  Last Fill: Day Supply Jardiance '10mg'$  06/17/21 30       unable to afford Metformin xr '500mg'$  07/07/21 90 Pravastatin '40mg'$  06/27/21  90   Care Gaps: Annual wellness visit in last year? Yes Most Recent BP reading:128/70  75-P  09/19/21  If Diabetic: Most recent A1C reading:7.6  08/06/21 Last eye exam / retinopathy screening:2021 Last diabetic foot exam:UTD  Charlene Brooke, CPP notified  Avel Sensor, [place]  [phone]

## 2021-10-06 ENCOUNTER — Ambulatory Visit (INDEPENDENT_AMBULATORY_CARE_PROVIDER_SITE_OTHER): Payer: Medicare Other | Admitting: Pharmacist

## 2021-10-06 DIAGNOSIS — I10 Essential (primary) hypertension: Secondary | ICD-10-CM

## 2021-10-06 DIAGNOSIS — I2510 Atherosclerotic heart disease of native coronary artery without angina pectoris: Secondary | ICD-10-CM

## 2021-10-06 DIAGNOSIS — E039 Hypothyroidism, unspecified: Secondary | ICD-10-CM

## 2021-10-06 DIAGNOSIS — E119 Type 2 diabetes mellitus without complications: Secondary | ICD-10-CM

## 2021-10-06 DIAGNOSIS — E785 Hyperlipidemia, unspecified: Secondary | ICD-10-CM

## 2021-10-06 DIAGNOSIS — M8580 Other specified disorders of bone density and structure, unspecified site: Secondary | ICD-10-CM

## 2021-10-06 NOTE — Patient Instructions (Signed)
Visit Information  Phone number for Pharmacist: [phone]   Goals Addressed   None     Care Plan : [place]  Updates made by Charlton Haws, RPH since 10/06/2021 12:00 AM     Problem: Hypertension, Hyperlipidemia, Diabetes, and Coronary Artery Disease   Priority: High     Long-Range Goal: Disease mgmt   Start Date: 10/06/2021  Expected End Date: 10/07/2022  This Visit's Progress: On track  Priority: High  Note:   Current Barriers:  Unable to independently afford treatment regimen  Pharmacist Clinical Goal(s):  Patient will verbalize ability to afford treatment regimen through collaboration with PharmD and provider.   Interventions: 1:1 collaboration with [name], MD regarding development and update of comprehensive plan of care as evidenced by provider attestation and co-signature Inter-disciplinary care team collaboration (see longitudinal plan of care) Comprehensive medication review performed; medication list updated in electronic medical record  Hypertension (BP goal <130/80) -Controlled -Current home readings: n/a -Current treatment: Metoprolol tartrate 25 mg BID - Appropriate, Effective, Safe, Accessible -Medications previously tried: n/a  -Educated on BP goals and benefits of medications for prevention of heart attack, stroke and kidney damage; -Counseled to monitor BP at home periodically -Recommended to continue current medication  Hyperlipidemia: (LDL goal < 70) -Controlled - LDL 29 (07/2021) at goal, TRIG 446 elevated, pt believes he was not fasting for this labwork -Hx CAD (CABG 03/2021) -Current treatment: Pravastatin 40 mg daily - Appropriate, Effective, Safe, Accessible Aspirin 325 mg daily - Appropriate, Effective, Safe, Accessible -Medications previously tried: n/a  -Educated on Cholesterol goals;   -Counseled on low triglyceride diet -Recommend to continue current medication  Diabetes (A1c goal <7%) -Not ideally  controlled - A1c 7.6% (07/2021), pt has been off Jardiance since April-May 2023 due to high cost in donut hole -Current home glucose readings fasting glucose: 150-160; previously 120-130 on Jardiance -Current medications: Jardiance 10 mg daily (not taking) - Appropriate, Effective, Safe, Inaccessible Metformin XR 500 mg - 2 tab BID - Appropriate, Effective, Safe, Accessible -Medications previously tried: n/a  -Current exercise: limited since transplant as he cannot be in the sun as much (he used to walk outside) -Educated on A1c and blood sugar goals; -Assessed patient finances. He will qualify for Jardiance PAP, completed forms today and faxed to [place] to continue current medication; restart Jardiance once received  Hypothyroidism (Goal TSH: 0.4-4.5 -Controlled - TSH at goal; pt takes levothyroxine with other medications in AM -Current treatment: Levothyroxine 175 mcg daily - Appropriate, Effective, Safe, Accessible -Counseled to take medication as he has been -Recommended to continue current medication  Osteopenia (Goal prevent fractures) -Controlled -Last DEXA Scan: 08/2019 - osteopenia per chart. Results unavailable. Fosamax started 11/2018 -Current treatment  Alendronate 75 mg weekly - Appropriate, Effective, Safe, Accessible -Medications previously tried: n/a  -Recommend weight-bearing and muscle strengthening exercises for building and maintaining bone density. -Recommended to continue current medication  Health Maintenance -Vaccine gaps: None -Hx Liver transplant, on Cellcept and Prograf. Follows with [facility] transplant -Hx low testosterone, previously on testosterone 1% gel packets but stopped due to high cost. Advised to discuss resuming supplementation with PCP at annual visit - Rx outreach cost is $30 for 30 packets  Patient Goals/Self-Care Activities Patient will:  - take medications as prescribed as evidenced by patient report and record review focus on  medication adherence by pill box check glucose 2-3x weekly, document, and provide at future appointments check blood pressure 2-3x weekly, document, and provide at  future appointments collaborate with provider on medication access solutions      Patient verbalizes understanding of instructions and care plan provided today and agrees to view in [place]. Active MyChart status and patient understanding of how to access instructions and care plan via MyChart confirmed with patient.    Telephone follow up appointment with pharmacy team member scheduled for: 4 months  Charlene Brooke, PharmD, [facility] Clinical Pharmacist [facility] [phone]

## 2021-10-06 NOTE — Progress Notes (Signed)
Chronic Care Management Pharmacy Note  10/06/2021 Name:  [name] MRN:  [id number] DOB:  [birth year]  Summary: CCM Initial visit -Reviewed medications; pt affirms compliance (except Jardiance - stopped due to high cost) -Reviewed Jardiance PAP - pt will qualify. Completed forms and faxed to [facility] cares -Pt reports he was previously on testosterone (topical gel packets) but stopped due to high cost, he is interested in resuming supplementation and states he was referred back to PCP for this  Recommendations/Changes made from today's visit: -Pursue Jardiance PAP -Advised pt to discuss testosterone supplementation with PCP; he will likely need new testosterone labwork (last done 10/2020)  Plan: -Sipsey will follow up with [employer] re: Vania Rea -Pharmacist follow up televisit scheduled for Dec 2023    Subjective: [name] is an 61 y.o. year old male who is a primary patient of Damita Dunnings, Elveria Rising, MD.  The CCM team was consulted for assistance with disease management and care coordination needs.    Engaged with patient face to face for initial visit in response to provider referral for pharmacy case management and/or care coordination services.   Consent to Services:  The patient was given the following information about Chronic Care Management services today, agreed to services, and gave verbal consent: 1. CCM service includes personalized support from designated clinical staff supervised by the primary care provider, including individualized plan of care and coordination with other care providers 2. 24/7 contact phone numbers for assistance for urgent and routine care needs. 3. Service will only be billed when office clinical staff spend 20 minutes or more in a month to coordinate care. 4. Only one practitioner may furnish and bill the service in a calendar month. 5.The patient may stop CCM services at any time (effective at the end of the month) by phone call to the  office staff. 6. The patient will be responsible for cost sharing (co-pay) of up to 20% of the service fee (after annual deductible is met). Patient agreed to services and consent obtained.  Patient Care Team: [name], MD as PCP - General Fay Records, MD as PCP - Cardiology (Cardiology) Charlton Haws, [facility] as Pharmacist (Pharmacist)  Recent office visits: 09/19/21 Dr Damita Dunnings OV: f/u - needs help with Jardiance cost. Referred to endocrine.  Recent consult visits: 09/05/21-Laura Cramer,RN([facility] -transplant) telemedicine -referral for future imaging  08/06/21-Paula Ross,MD(cardio)-f/u CAD,EKG,labs(CBC is normal Electrolytes:  Potassium is minimally increased  Follow  Stay hydrated   Kidney function is normalLDL is excellent   Triglycerids are high 446   Watch carbs TSH is minimall elevated Could try increasing synthroid  to 175 mg     Follow up TSH in 2 months) 04/30/21-Bryan Bartle,MD(cardio)-routine post op,no medication changes 04/23/21-Cardio- hospital f/u, labs BMET(Labs normal except K still high)  Hospital visits: None in previous 6 months   Objective:  Lab Results  Component Value Date   CREATININE 1.18 08/06/2021   BUN 19 08/06/2021   GFR 77.38 11/18/2020   EGFR 71 08/06/2021   GFRNONAA >60 04/07/2021   GFRAA >60 05/12/2016   NA 137 08/06/2021   K 5.3 (H) 08/06/2021   CALCIUM 9.4 08/06/2021   CO2 23 08/06/2021   GLUCOSE 140 (H) 08/06/2021    Lab Results  Component Value Date/Time   HGBA1C 7.6 (H) 08/06/2021 08:52 AM   HGBA1C 7.4 (H) 04/01/2021 10:50 AM   HGBA1C 5.1 01/04/2018 12:00 AM   GFR 77.38 11/18/2020 09:58 AM   GFR 70.37 11/03/2019  08:49 AM   MICROALBUR 2.1 (H) 11/18/2020 09:58 AM   MICROALBUR 3.7 (H) 11/03/2019 09:58 AM    Last diabetic Eye exam:  Lab Results  Component Value Date/Time   HMDIABEYEEXA No Retinopathy 01/10/2020 12:00 AM    Last diabetic Foot exam: No results found for: "HMDIABFOOTEX"   Lab Results  Component Value Date    CHOL 122 08/06/2021   HDL 31 (L) 08/06/2021   LDLCALC 29 08/06/2021   LDLDIRECT 35.0 11/18/2020   TRIG 446 (H) 08/06/2021   CHOLHDL 3.9 08/06/2021       Latest Ref Rng & Units 08/06/2021    8:52 AM 04/01/2021   10:49 AM 11/18/2020    9:58 AM  Hepatic Function  Total Protein 6.0 - 8.5 g/dL 7.6  7.6  7.6   Albumin 3.8 - 4.9 g/dL 4.8  4.3  4.8   AST 0 - 40 IU/L 37  51  40   ALT 0 - 44 IU/L 60  75  71   Alk Phosphatase 44 - 121 IU/L 86  63  65   Total Bilirubin 0.0 - 1.2 mg/dL 0.3  0.7  0.5     Lab Results  Component Value Date/Time   TSH 4.09 09/19/2021 02:59 PM   TSH 6.510 (H) 08/06/2021 08:52 AM       Latest Ref Rng & Units 08/06/2021    8:52 AM 04/07/2021    1:24 AM 04/06/2021    6:49 AM  CBC  WBC 3.4 - 10.8 x10E3/uL 9.2  13.7  14.6   Hemoglobin 13.0 - 17.7 g/dL 15.7  8.8  10.0   Hematocrit 37.5 - 51.0 % 45.9  27.6  30.5   Platelets 150 - 450 x10E3/uL 243  259  189     Lab Results  Component Value Date/Time   VD25OH 29.18 (L) 11/18/2020 09:58 AM    Clinical ASCVD: Yes  The ASCVD Risk score (Arnett DK, et al., 2019) failed to calculate for the following reasons:   The valid total cholesterol range is 130 to 320 mg/dL       08/27/2021   10:13 AM 07/03/2021    3:05 PM 11/18/2020    9:03 AM  Depression screen PHQ 2/9  Decreased Interest 0 0 0  Down, Depressed, Hopeless 0 0 0  PHQ - 2 Score 0 0 0  Altered sleeping   0  Tired, decreased energy   0  Change in appetite   0  Feeling bad or failure about yourself    0  Trouble concentrating   0  Moving slowly or fidgety/restless   0  Suicidal thoughts   0  PHQ-9 Score   0  Difficult doing work/chores   Not difficult at all     Social History   Tobacco Use  Smoking Status Never   Passive exposure: Past  Smokeless Tobacco Never   BP Readings from Last 3 Encounters:  09/19/21 128/70  08/06/21 (!) 146/86  07/03/21 (!) 142/84   Pulse Readings from Last 3 Encounters:  09/19/21 75  08/06/21 66  07/03/21 64    Wt Readings from Last 3 Encounters:  09/19/21 207 lb (93.9 kg)  08/25/21 202 lb 13.2 oz (92 kg)  08/06/21 206 lb 12.8 oz (93.8 kg)   BMI Readings from Last 3 Encounters:  09/19/21 30.35 kg/m  08/25/21 29.74 kg/m  08/06/21 30.32 kg/m    Assessment/Interventions: Review of patient past medical history, allergies, medications, health status, including review of consultants reports, laboratory  and other test data, was performed as part of comprehensive evaluation and provision of chronic care management services.   SDOH:  (Social Determinants of Health) assessments and interventions performed: No - done July 2023  SDOH Screenings   Alcohol Screen: Not on file  Depression (PHQ2-9): Low Risk  (08/27/2021)   Depression (PHQ2-9)    PHQ-2 Score: 0  Financial Resource Strain: Not on file  Food Insecurity: Not on file  Housing: Not on file  Physical Activity: Not on file  Social Connections: Not on file  Stress: Not on file  Tobacco Use: Low Risk  (09/19/2021)   Patient History    Smoking Tobacco Use: Never    Smokeless Tobacco Use: Never    Passive Exposure: Past  Transportation Needs: Not on file    [place]  Allergies  Allergen Reactions   Watermelon Flavor Itching    Mouth itching    Medications Reviewed Today     Reviewed by Charlton Haws, [place] (Pharmacist) on 10/06/21 at 1009  Med List Status: <None>   Medication Order Taking? Sig Documenting Provider Last Dose Status Informant  acetaminophen (TYLENOL) 500 MG tablet [id number] Yes Take 500 mg by mouth every 6 (six) hours as needed for moderate pain or headache. [provider] Taking Active Self  albuterol (VENTOLIN HFA) 108 (90 Base) MCG/ACT inhaler [id number] Yes Inhale 2 puffs into the lungs every 6 (six) hours as needed for wheezing or shortness of breath. [provider] Taking Active Self  alendronate (FOSAMAX) 70 MG tablet [id number] Yes Take 1 tablet (70 mg total) by mouth every  Sunday. Take with a full glass of water on an empty stomach. [name], MD Taking Active Self  aspirin EC 325 MG EC tablet [id number] Yes Take 1 tablet (325 mg total) by mouth daily. John Giovanni, PA-C Taking Active Self  azelastine (ASTELIN) 0.1 % nasal spray [id number] Yes PLACE 2 SPRAYS INTO BOTH NOSTRILS TWICE DAILY AS NEEDED [name], MD Taking Active Self  calcium carbonate (TUMS - DOSED IN MG ELEMENTAL CALCIUM) 500 MG chewable tablet [id number] Yes Chew 1 tablet by mouth daily. [provider] Taking Active Self  Cholecalciferol (VITAMIN D) 50 MCG (2000 UT) tablet [id number] Yes Take 2,000 Units by mouth daily. [provider] Taking Active Self  empagliflozin (JARDIANCE) 10 MG TABS tablet [id number] No Take 1 tablet (10 mg total) by mouth daily before breakfast.  Patient not taking: Reported on 10/06/2021   [name], MD Not Taking Active Self           Med Note Luna Glasgow Oct 06, 2021 10:09 AM) Pending BI Cares approval  fluticasone (FLONASE) 50 MCG/ACT nasal spray [id number] Yes PLACE ONE OR TWO SPRAYS INTO BOTH NOSTRILS DAILY AS NEEDED. [name], MD Taking Active Self  levothyroxine (SYNTHROID) 175 MCG tablet [id number] Yes Take 1 tablet (175 mcg total) by mouth daily before breakfast. Fay Records, MD Taking Active   metFORMIN (GLUCOPHAGE-XR) 500 MG 24 hr tablet [id number] Yes Take 2 tablets (1,000 mg total) by mouth in the morning and at bedtime. [name], MD Taking Active   metoprolol tartrate (LOPRESSOR) 25 MG tablet [id number] Yes TAKE 1 TABLET BY MOUTH TWICE A DAY [name], MD Taking Active   metroNIDAZOLE (METROGEL) 1 % gel [id number] Yes Apply 1 application topically daily as needed (rosacea). [provider] Taking Active Self  mycophenolate (CELLCEPT) 250 MG capsule [id number] Yes Take  250 mg by mouth 2 (two) times daily. [provider] Taking Active Self  Baruch Gouty [id number] Yes Pt uses  a cpap nightly [provider] Taking Active Self  Jonetta Speak Lancets 17E MISC [id number] Yes USE TO [place] [name], MD Taking Active Self  [facility] ULTRA test strip [id number] Yes USE TO CHECK SUGAR DAILY [name], MD Taking Active Self  pravastatin (PRAVACHOL) 40 MG tablet [id number] Yes Take 40 mg by mouth every evening. [provider] Taking Active Self  sertraline (ZOLOFT) 50 MG tablet [id number] Yes Take 1 tablet (50 mg total) by mouth daily. [name], MD Taking Active   tacrolimus (PROGRAF) 1 MG capsule [id number] Yes Take 2 mg by mouth 2 (two) times daily. [provider] Taking Active Self            Patient Active Problem List   Diagnosis Date Noted   Knee bursitis 09/21/2021   S/P AVR (aortic valve replacement) 04/03/2021   Osteopenia 03/19/2021   Left hand pain 03/19/2021   Coronary artery disease involving native coronary artery of native heart without angina pectoris    Severe aortic stenosis    Welcome to Medicare preventive visit 11/20/2020   HLD (hyperlipidemia) 11/12/2019   Liver transplant recipient [facility]) 09/18/2018   Nephrolithiasis 07/28/2018   Arthralgia 10/25/2017   Diabetes mellitus without complication ([place]) 96/28/3662   Mucosal abnormality of stomach    Advance care planning 02/12/2015   Flying phobia 94/76/5465   Umbilical hernia 03/54/6568   Asthma 09/14/2011   OSA (obstructive sleep apnea) 09/14/2011   FH: prostate cancer 02/25/2011   Routine general medical examination at a health care facility 02/25/2011   Bicuspid aortic valve 02/25/2011   Rash 09/25/2009   PURE HYPERCHOLESTEROLEMIA 02/14/2007   LOSS, HEARING NOS 08/11/2006   Hypothyroidism 08/09/2006   Essential hypertension 08/09/2006   ALLERGIC RHINITIS 08/09/2006    Immunization History  Administered Date(s) Administered   Hepatitis A, Adult 08/28/2021   Hepb-cpg 08/28/2021   Influenza Split 11/18/2011   Influenza Whole  12/27/2007, 10/29/2009   Influenza,inj,Quad PF,6+ Mos 01/25/2014, 02/11/2015, 01/19/2017, 01/11/2018, 10/28/2018, 11/10/2019, 11/18/2020   Influenza-Unspecified 01/11/2018, 11/23/2018   PFIZER Comirnaty(Gray Top)Covid-19 Tri-Sucrose Vaccine 09/17/2020   PFIZER(Purple Top)SARS-COV-2 Vaccination 08/31/2019, 09/21/2019, 09/17/2020   [place] Covid Bivalent Pediatric Vaccine(75mo to <567yr 03/10/2021   Pneumococcal Conjugate-13 04/17/2019   Pneumococcal Polysaccharide-23 01/11/2018   Td 03/05/1997, 10/09/2008   Tdap 11/10/2019   Zoster Recombinat (Shingrix) 09/17/2020, 02/03/2021    Conditions to be addressed/monitored:  Hypertension, Hyperlipidemia, Diabetes, and Coronary Artery Disease  Care Plan : CCM Pharmacy Care Plan  Updates made by FoCharlton HawsRPSalisbury Millsince 10/06/2021 12:00 AM     Problem: Hypertension, Hyperlipidemia, Diabetes, and Coronary Artery Disease   Priority: High     Long-Range Goal: Disease mgmt   Start Date: 10/06/2021  Expected End Date: 10/07/2022  This Visit's Progress: On track  Priority: High  Note:   Current Barriers:  Unable to independently afford treatment regimen  Pharmacist Clinical Goal(s):  Patient will verbalize ability to afford treatment regimen through collaboration with PharmD and provider.   Interventions: 1:1 collaboration with DuTonia GhentMD regarding development and update of comprehensive plan of care as evidenced by provider attestation and co-signature Inter-disciplinary care team collaboration (see longitudinal plan of care) Comprehensive medication review performed; medication list updated in electronic medical record  Hypertension (BP goal <130/80) -Controlled -Current home readings: n/a -Current treatment: Metoprolol tartrate 25 mg BID -  Appropriate, Effective, Safe, Accessible -Medications previously tried: n/a  -Educated on BP goals and benefits of medications for prevention of heart attack, stroke and kidney  damage; -Counseled to monitor BP at home periodically -Recommended to continue current medication  Hyperlipidemia: (LDL goal < 70) -Controlled - LDL 29 (07/2021) at goal, TRIG 446 elevated, pt believes he was not fasting for this labwork -Hx CAD (CABG 03/2021) -Current treatment: Pravastatin 40 mg daily - Appropriate, Effective, Safe, Accessible Aspirin 325 mg daily - Appropriate, Effective, Safe, Accessible -Medications previously tried: n/a  -Educated on Cholesterol goals;   -Counseled on low triglyceride diet -Recommend to continue current medication  Diabetes (A1c goal <7%) -Not ideally controlled - A1c 7.6% (07/2021), pt has been off Jardiance since April-May 2023 due to high cost in donut hole -Current home glucose readings fasting glucose: 150-160; previously 120-130 on Jardiance -Current medications: Jardiance 10 mg daily (not taking) - Appropriate, Effective, Safe, Inaccessible Metformin XR 500 mg - 2 tab BID - Appropriate, Effective, Safe, Accessible -Medications previously tried: n/a  -Current exercise: limited since transplant as he cannot be in the sun as much (he used to walk outside) -Educated on A1c and blood sugar goals; -Assessed patient finances. He will qualify for Jardiance PAP, completed forms today and faxed to [place] to continue current medication; restart Jardiance once received  Hypothyroidism (Goal TSH: 0.4-4.5 -Controlled - TSH at goal; pt takes levothyroxine with other medications in AM -Current treatment: Levothyroxine 175 mcg daily - Appropriate, Effective, Safe, Accessible -Counseled to take medication as he has been -Recommended to continue current medication  Osteopenia (Goal prevent fractures) -Controlled -Last DEXA Scan: 08/2019 - osteopenia per chart. Results unavailable. Fosamax started 11/2018 -Current treatment  Alendronate 75 mg weekly - Appropriate, Effective, Safe, Accessible -Medications previously tried: n/a  -Recommend  weight-bearing and muscle strengthening exercises for building and maintaining bone density. -Recommended to continue current medication  Health Maintenance -Vaccine gaps: None -Hx Liver transplant, on Cellcept and Prograf. Follows with [facility] transplant -Hx low testosterone, previously on testosterone 1% gel packets but stopped due to high cost. Advised to discuss resuming supplementation with PCP at annual visit - Rx outreach cost is $30 for 30 packets  Patient Goals/Self-Care Activities Patient will:  - take medications as prescribed as evidenced by patient report and record review focus on medication adherence by pill box check glucose 2-3x weekly, document, and provide at future appointments check blood pressure 2-3x weekly, document, and provide at future appointments collaborate with provider on medication access solutions      Medication Assistance:  Jardiance - [employer] C[employer] PAP pending  Compliance/Adherence/Medication fill history: Care Gaps: Eye exam (due 01/09/21)  Star-Rating Drugs: Metformin - PDC 94% Pravastatin - PDC 94% Jardiance - PDC 59% (stopped filling in donut hole)  Medication Access: Within the past 30 days, how often has patient missed a dose of medication? 0 Is a pillbox or other method used to improve adherence? Yes  Factors that may affect medication adherence? no barriers identified Are meds synced by current pharmacy? No  Are meds delivered by current pharmacy? No  Does patient experience delays in picking up medications due to transportation concerns? No   [facility] Services Reviewed: Is patient disadvantaged to use [facility]?: No  Current Rx insurance plan: [facility] Name and location of Current pharmacy[facility]  [id number] [place] [place]: [phone]: [place]- [phone]  [address] [place]  [address] Phone: [phone] Fax: [phone]  [facility] services reviewed with patient today?: No  Patient requests to transfer care to [facility] Pharmacy?: No  Reason patient declined to change pharmacies: Loyalty to other pharmacy/Patient preference   Care Plan and Follow Up Patient Decision:  Patient agrees to Care Plan and Follow-up.  Plan: Telephone follow up appointment with care management team member scheduled for:  4 months  Charlene Brooke, PharmD, BCACP Clinical Pharmacist [facility] [phone]

## 2021-10-07 ENCOUNTER — Telehealth: Payer: Self-pay | Admitting: Family Medicine

## 2021-10-07 DIAGNOSIS — G4733 Obstructive sleep apnea (adult) (pediatric): Secondary | ICD-10-CM | POA: Diagnosis not present

## 2021-10-07 NOTE — Telephone Encounter (Signed)
Please update patient about testosterone use.  I would get started on jardiance first and get his sugar improved before considering restarting testosterone.  And he would need some repeat labs first.  I would set a follow up appointment about 3 months after restarting jardiance and then go from there.  Thanks.

## 2021-10-07 NOTE — Telephone Encounter (Signed)
Called and spoke with patient. Patient should be starting Jardiance soon. Wanted to go ahead and schedule his AWV for 01/01/22.

## 2021-10-10 ENCOUNTER — Telehealth: Payer: Self-pay

## 2021-10-10 NOTE — Progress Notes (Signed)
Contacted manufacturer for update on assistance for [employer]. Patient is approved 10/08/21 through 02/22/22. First shipment week of 8/21.  Charlene Brooke, CPP notified  Avel Sensor, [place]  [phone]

## 2021-10-11 DIAGNOSIS — G4733 Obstructive sleep apnea (adult) (pediatric): Secondary | ICD-10-CM | POA: Diagnosis not present

## 2021-10-13 ENCOUNTER — Other Ambulatory Visit: Payer: Self-pay | Admitting: Family Medicine

## 2021-10-13 DIAGNOSIS — E039 Hypothyroidism, unspecified: Secondary | ICD-10-CM

## 2021-10-23 DIAGNOSIS — Z7984 Long term (current) use of oral hypoglycemic drugs: Secondary | ICD-10-CM

## 2021-10-23 DIAGNOSIS — E785 Hyperlipidemia, unspecified: Secondary | ICD-10-CM | POA: Diagnosis not present

## 2021-10-23 DIAGNOSIS — I10 Essential (primary) hypertension: Secondary | ICD-10-CM

## 2021-10-23 DIAGNOSIS — E039 Hypothyroidism, unspecified: Secondary | ICD-10-CM

## 2021-10-23 DIAGNOSIS — E1159 Type 2 diabetes mellitus with other circulatory complications: Secondary | ICD-10-CM

## 2021-10-23 DIAGNOSIS — M8580 Other specified disorders of bone density and structure, unspecified site: Secondary | ICD-10-CM | POA: Diagnosis not present

## 2021-10-28 ENCOUNTER — Other Ambulatory Visit: Payer: Self-pay | Admitting: Family Medicine

## 2021-10-30 ENCOUNTER — Encounter: Payer: Medicare Other | Attending: Internal Medicine

## 2021-10-30 DIAGNOSIS — R7989 Other specified abnormal findings of blood chemistry: Secondary | ICD-10-CM | POA: Diagnosis not present

## 2021-10-30 LAB — TSH: TSH: 1.01 u[IU]/mL (ref 0.450–4.500)

## 2021-10-30 MED FILL — TACROLIMUS 1 MG CAPSULE, IMMEDIATE-RELEASE: ORAL | 30 days supply | Qty: 120 | Fill #4

## 2021-10-30 MED FILL — ALENDRONATE 70 MG TABLET: ORAL | 84 days supply | Qty: 12 | Fill #2

## 2021-10-30 MED FILL — MYCOPHENOLATE MOFETIL 250 MG CAPSULE: ORAL | 30 days supply | Qty: 60 | Fill #3

## 2021-10-30 NOTE — Unmapped (Signed)
[facility] Specialty Pharmacy Refill Coordination Note    Specialty Medication(s) to be Shipped:   Transplant: mycophenolate mofetil 250mg  and tacrolimus 1mg     Other medication(s) to be shipped:  alendronate 70 MG tablet (FOSAMAX)     [name], DOB: 1960-04-10  Phone: [phone] (home)       All above HIPAA information was verified with patient.     Was a Nurse, learning disability used for this call? No    Completed refill call assessment today to schedule patient's medication shipment from the [facility] [phone]).  All relevant notes have been reviewed.     Specialty medication(s) and dose(s) confirmed: Regimen is correct and unchanged.   Changes to medications: [name] reports no changes at this time.  Changes to insurance: No  New side effects reported not previously addressed with a pharmacist or physician: None reported  Questions for the pharmacist: No    Confirmed patient received a Conservation officer, historic buildings and a Surveyor, mining with first shipment. The patient will receive a drug information handout for each medication shipped and additional FDA Medication Guides as required.       DISEASE/MEDICATION-SPECIFIC INFORMATION        N/A    SPECIALTY MEDICATION ADHERENCE     Medication Adherence    Patient reported X missed doses in the last month: 0  Specialty Medication: mycophenolate 250 mg  Patient is on additional specialty medications: Yes  Additional Specialty Medications: Tacrolimus 1 mg   Patient Reported Additional Medication X Missed Doses in the Last Month: 0  Patient is on more than two specialty medications: No                                Were doses missed due to medication being on hold? No    mycophenolate 250 mg: 5 days of medicine on hand   Tacrolimus  1 mg: 5 days of medicine on hand     REFERRAL TO PHARMACIST     Referral to the pharmacist: Not needed      [facility]     Shipping address confirmed in Epic.     Delivery Scheduled: Yes, Expected medication delivery date: 10/31/21.     Medication will be delivered via UPS to the prescription address in Epic WAM.    [place] A Jabriel Vanduyne   [facility] Shared [facility] Specialty Technician

## 2021-11-07 DIAGNOSIS — G4733 Obstructive sleep apnea (adult) (pediatric): Secondary | ICD-10-CM | POA: Diagnosis not present

## 2021-11-10 DIAGNOSIS — C220 Liver cell carcinoma: Principal | ICD-10-CM

## 2021-11-10 DIAGNOSIS — Z944 Liver transplant status: Principal | ICD-10-CM

## 2021-11-17 NOTE — Unmapped (Signed)
[facility] Specialty Pharmacy Refill Coordination Note    Specialty Medication(s) to be Shipped:   Transplant: mycophenolate mofetil 250mg  and tacrolimus 1mg     Other medication(s) to be shipped: No additional medications requested for fill at this time     Anthony Alvarado, DOB: May 20, 1960  Phone: [phone] (home)       All above HIPAA information was verified with patient.     Was a Nurse, learning disability used for this call? No    Completed refill call assessment today to schedule patient's medication shipment from the [facility] [phone]).  All relevant notes have been reviewed.     Specialty medication(s) and dose(s) confirmed: Patient reports changes to the regimen as follows: pt reports missing 1 dose of each medication    Changes to medications: Anthony Alvarado reports no changes at this time.  Changes to insurance: No  New side effects reported not previously addressed with a pharmacist or physician: None reported  Questions for the pharmacist: No    Confirmed patient received a Conservation officer, historic buildings and a Surveyor, mining with first shipment. The patient will receive a drug information handout for each medication shipped and additional FDA Medication Guides as required.       DISEASE/MEDICATION-SPECIFIC INFORMATION        N/A    SPECIALTY MEDICATION ADHERENCE     Medication Adherence    Patient reported X missed doses in the last month: 1  Specialty Medication: mycophenolate 250 mg capsule (CELLCEPT)  Patient is on additional specialty medications: Yes  Additional Specialty Medications: tacrolimus 1 MG capsule (PROGRAF)  Patient Reported Additional Medication X Missed Doses in the Last Month: 1  Patient is on more than two specialty medications: No                                Were doses missed due to medication being on hold? No    tacrolimus 1  mg: 7 days of medicine on hand   mycophenolate 250 mg: 7 days of medicine on hand       REFERRAL TO PHARMACIST     Referral to the pharmacist: Not needed      [facility]     Shipping address confirmed in Epic.     Delivery Scheduled: Yes, Expected medication delivery date: 11/25/21.     Medication will be delivered via UPS to the prescription address in Epic WAM.    Anthony Alvarado   [facility] Shared [facility] Specialty [facility] Special[facility] Pharmacy Refill Coordination Note

## 2021-11-24 MED FILL — MYCOPHENOLATE MOFETIL 250 MG CAPSULE: ORAL | 30 days supply | Qty: 60 | Fill #4

## 2021-11-24 MED FILL — TACROLIMUS 1 MG CAPSULE, IMMEDIATE-RELEASE: ORAL | 30 days supply | Qty: 120 | Fill #5

## 2021-11-29 ENCOUNTER — Other Ambulatory Visit: Payer: Self-pay | Admitting: Family Medicine

## 2021-12-05 ENCOUNTER — Telehealth: Payer: Self-pay

## 2021-12-05 NOTE — Progress Notes (Unsigned)
   Subjective:  This encounter was created in error - please disregard. Patient unable to completed visit at this time. Will call back to reschedule

## 2021-12-05 NOTE — Telephone Encounter (Signed)
Contacted patient on preferred number listed in notes for scheduled AWV.  Patient stated unable to complete visit at this time will call back to reschedule 

## 2021-12-15 NOTE — Unmapped (Signed)
[facility] Refill Coordination Note    Specialty Medication(s) to be Shipped:   Transplant: mycophenolate mofetil 250mg  and tacrolimus 1mg     Other medication(s) to be shipped:  Pravastatin, sertraline, metformin     Anthony Alvarado, DOB: April 20, 1960  Phone: [phone] (home)       All above HIPAA information was verified with patient.     Was a Nurse, learning disability used for this call? No    Completed refill call assessment today to schedule patient's medication shipment from the [facility] [phone]).  All relevant notes have been reviewed.     Specialty medication(s) and dose(s) confirmed: Patient reports changes to the regimen as follows: pt reports missing 1 dose of each medication    Changes to medications: [name] reports no changes at this time.  Changes to insurance: No  New side effects reported not previously addressed with a pharmacist or physician: None reported  Questions for the pharmacist: No    Confirmed patient received a Conservation officer, historic buildings and a Surveyor, mining with first shipment. The patient will receive a drug information handout for each medication shipped and additional FDA Medication Guides as required.       DISEASE/MEDICATION-SPECIFIC INFORMATION        N/A    SPECIALTY MEDICATION ADHERENCE     Medication Adherence    Patient reported X missed doses in the last month: 0  Specialty Medication: tacrolimus 1 MG capsule (PROGRAF)  Patient is on additional specialty medications: Yes  Additional Specialty Medications: mycophenolate 250 mg capsule (CELLCEPT)  Patient Reported Additional Medication X Missed Doses in the Last Month: 0  Patient is on more than two specialty medications: No                                Were doses missed due to medication being on hold? No    tacrolimus 1  mg: 10 days of medicine on hand   mycophenolate 250 mg: 10 days of medicine on hand       REFERRAL TO PHARMACIST     Referral to the pharmacist: Not needed      [facility] Shipping address confirmed in Epic.     Delivery Scheduled: Yes, Expected medication delivery date: 12/24/21.     Medication will be delivered via UPS to the prescription address in Epic WAM.    Anthony Alvarado   [facility] Specialty Technician

## 2021-12-16 NOTE — Unmapped (Signed)
[name] 's entire shipment will be delayed as a result of the medication is too soon to refill until 12/17/2021 (Mycophenolate and Tac).     I have reached out to the patient  at [phone] and communicated the delivery change. We will reschedule the medication for the delivery date that the patient agreed upon.  We have confirmed the delivery date as 12/18/2021, via ups.

## 2021-12-17 MED FILL — MYCOPHENOLATE MOFETIL 250 MG CAPSULE: ORAL | 30 days supply | Qty: 60 | Fill #5

## 2021-12-17 MED FILL — SERTRALINE 50 MG TABLET: ORAL | 90 days supply | Qty: 90 | Fill #1

## 2021-12-17 MED FILL — TACROLIMUS 1 MG CAPSULE, IMMEDIATE-RELEASE: ORAL | 30 days supply | Qty: 120 | Fill #6

## 2021-12-17 MED FILL — METFORMIN ER 500 MG TABLET,EXTENDED RELEASE 24 HR: ORAL | 90 days supply | Qty: 360 | Fill #1

## 2021-12-17 MED FILL — PRAVASTATIN 40 MG TABLET: ORAL | 90 days supply | Qty: 90 | Fill #2

## 2021-12-25 DIAGNOSIS — E612 Magnesium deficiency: Secondary | ICD-10-CM | POA: Diagnosis not present

## 2021-12-25 DIAGNOSIS — Z5181 Encounter for therapeutic drug level monitoring: Secondary | ICD-10-CM | POA: Diagnosis not present

## 2021-12-25 DIAGNOSIS — Z944 Liver transplant status: Secondary | ICD-10-CM | POA: Diagnosis not present

## 2021-12-25 DIAGNOSIS — C220 Liver cell carcinoma: Secondary | ICD-10-CM | POA: Diagnosis not present

## 2021-12-26 ENCOUNTER — Telehealth: Payer: Self-pay | Admitting: Family Medicine

## 2021-12-26 LAB — CBC W/ DIFFERENTIAL
BANDED NEUTROPHILS ABSOLUTE COUNT: 0.1 10*3/uL (ref 0.0–0.1)
BASOPHILS ABSOLUTE COUNT: 0.1 10*3/uL (ref 0.0–0.2)
BASOPHILS RELATIVE PERCENT: 1 %
EOSINOPHILS ABSOLUTE COUNT: 0.4 10*3/uL (ref 0.0–0.4)
EOSINOPHILS RELATIVE PERCENT: 4 %
HEMATOCRIT: 44.2 % (ref 37.5–51.0)
HEMOGLOBIN: 15.1 g/dL (ref 13.0–17.7)
IMMATURE GRANULOCYTES: 1 %
LYMPHOCYTES ABSOLUTE COUNT: 1.8 10*3/uL (ref 0.7–3.1)
LYMPHOCYTES RELATIVE PERCENT: 19 %
MEAN CORPUSCULAR HEMOGLOBIN CONC: 34.2 g/dL (ref 31.5–35.7)
MEAN CORPUSCULAR HEMOGLOBIN: 31.3 pg (ref 26.6–33.0)
MEAN CORPUSCULAR VOLUME: 92 fL (ref 79–97)
MONOCYTES ABSOLUTE COUNT: 0.9 10*3/uL (ref 0.1–0.9)
MONOCYTES RELATIVE PERCENT: 9 %
NEUTROPHILS ABSOLUTE COUNT: 6.4 10*3/uL (ref 1.4–7.0)
NEUTROPHILS RELATIVE PERCENT: 66 %
PLATELET COUNT: 226 10*3/uL (ref 150–450)
RED BLOOD CELL COUNT: 4.83 x10E6/uL (ref 4.14–5.80)
RED CELL DISTRIBUTION WIDTH: 12.8 % (ref 11.6–15.4)
WHITE BLOOD CELL COUNT: 9.6 10*3/uL (ref 3.4–10.8)

## 2021-12-26 LAB — COMPREHENSIVE METABOLIC PANEL
A/G RATIO: 1.8 (ref 1.2–2.2)
ALBUMIN: 4.7 g/dL (ref 3.8–4.9)
ALKALINE PHOSPHATASE: 83 IU/L (ref 44–121)
ALT (SGPT): 56 IU/L — ABNORMAL HIGH (ref 0–44)
AST (SGOT): 34 IU/L (ref 0–40)
BILIRUBIN TOTAL (MG/DL) IN SER/PLAS: 0.2 mg/dL (ref 0.0–1.2)
BLOOD UREA NITROGEN: 19 mg/dL (ref 6–24)
BUN / CREAT RATIO: 15 (ref 9–20)
CALCIUM: 9.3 mg/dL (ref 8.7–10.2)
CHLORIDE: 102 mmol/L (ref 96–106)
CO2: 20 mmol/L (ref 20–29)
CREATININE: 1.24 mg/dL (ref 0.76–1.27)
GLOBULIN, TOTAL: 2.6 g/dL (ref 1.5–4.5)
GLUCOSE: 148 mg/dL — ABNORMAL HIGH (ref 70–99)
POTASSIUM: 4.6 mmol/L (ref 3.5–5.2)
SODIUM: 137 mmol/L (ref 134–144)
TOTAL PROTEIN: 7.3 g/dL (ref 6.0–8.5)

## 2021-12-26 LAB — MAGNESIUM: MAGNESIUM: 1.7 mg/dL (ref 1.6–2.3)

## 2021-12-26 LAB — AFP TUMOR MARKER: AFP-TUMOR MARKER: <1.8 ng/mL (ref 0.0–8.4)

## 2021-12-26 LAB — GAMMA GT: GAMMA GLUTAMYL TRANSFERASE: 61 IU/L (ref 0–65)

## 2021-12-26 LAB — BILIRUBIN, DIRECT: BILIRUBIN DIRECT: 0.11 mg/dL (ref 0.00–0.40)

## 2021-12-26 LAB — PHOSPHORUS: PHOSPHORUS, SERUM: 3.1 mg/dL (ref 2.8–4.1)

## 2021-12-26 NOTE — Telephone Encounter (Signed)
Type of forms received:prescription renewal  Routed OH:KGOVPCH I  Paperwork received by : Gwynn Burly   Individual made aware of 3-5 business day turn around (Y/N):Y  Form completed and patient made aware of charges(Y/N):Y   Faxed to :   Form location: Place in PCP  folder

## 2021-12-27 LAB — TACROLIMUS LEVEL: TACROLIMUS BLOOD: 6 ng/mL (ref 2.0–20.0)

## 2021-12-29 NOTE — Telephone Encounter (Signed)
Forms placed in Dr. Josefine Class inbox

## 2021-12-31 ENCOUNTER — Ambulatory Visit (INDEPENDENT_AMBULATORY_CARE_PROVIDER_SITE_OTHER): Payer: Medicare Other

## 2021-12-31 VITALS — Wt 207.0 lb

## 2021-12-31 DIAGNOSIS — Z0000 Encounter for general adult medical examination without abnormal findings: Secondary | ICD-10-CM

## 2021-12-31 NOTE — Patient Instructions (Signed)
[name] , Thank you for taking time to come for your Medicare Wellness Visit. I appreciate your ongoing commitment to your health goals. Please review the following plan we discussed and let me know if I can assist you in the future.   Screening recommendations/referrals: Colonoscopy: 10/11/12 Recommended yearly ophthalmology/optometry visit for glaucoma screening and checkup Recommended yearly dental visit for hygiene and checkup  Vaccinations: Influenza vaccine: 11/18/20 Pneumococcal vaccine: 04/17/19 Tdap vaccine: 11/10/19 Shingles vaccine: Shingrix 09/17/20, 02/03/21   Covid-19: 08/31/19, 09/21/19, 09/17/20, 03/10/21  Advanced directives: no  Conditions/risks identified: none  Next appointment: Follow up in one year for your annual wellness visit 01/04/23 @ 9:15 am by phone  Preventive Care 40-64 Years, Male Preventive care refers to lifestyle choices and visits with your health care provider that can promote health and wellness. What does preventive care include? A yearly physical exam. This is also called an annual well check. Dental exams once or twice a year. Routine eye exams. Ask your health care provider how often you should have your eyes checked. Personal lifestyle choices, including: Daily care of your teeth and gums. Regular physical activity. Eating a healthy diet. Avoiding tobacco and drug use. Limiting alcohol use. Practicing safe sex. Taking low-dose aspirin every day starting at age 69. What happens during an annual well check? The services and screenings done by your health care provider during your annual well check will depend on your age, overall health, lifestyle risk factors, and family history of disease. Counseling  Your health care provider may ask you questions about your: Alcohol use. Tobacco use. Drug use. Emotional well-being. Home and relationship well-being. Sexual activity. Eating habits. Work and work Statistician. Screening  You may have  the following tests or measurements: Height, weight, and BMI. Blood pressure. Lipid and cholesterol levels. These may be checked every 5 years, or more frequently if you are over 11 years old. Skin check. Lung cancer screening. You may have this screening every year starting at age 61 if you have a 30-pack-year history of smoking and currently smoke or have quit within the past 15 years. Fecal occult blood test (FOBT) of the stool. You may have this test every year starting at age 77. Flexible sigmoidoscopy or colonoscopy. You may have a sigmoidoscopy every 5 years or a colonoscopy every 10 years starting at age 82. Prostate cancer screening. Recommendations will vary depending on your family history and other risks. Hepatitis C blood test. Hepatitis B blood test. Sexually transmitted disease (STD) testing. Diabetes screening. This is done by checking your blood sugar (glucose) after you have not eaten for a while (fasting). You may have this done every 1-3 years. Discuss your test results, treatment options, and if necessary, the need for more tests with your health care provider. Vaccines  Your health care provider may recommend certain vaccines, such as: Influenza vaccine. This is recommended every year. Tetanus, diphtheria, and acellular pertussis (Tdap, Td) vaccine. You may need a Td booster every 10 years. Zoster vaccine. You may need this after age 36. Pneumococcal 13-valent conjugate (PCV13) vaccine. You may need this if you have certain conditions and have not been vaccinated. Pneumococcal polysaccharide (PPSV23) vaccine. You may need one or two doses if you smoke cigarettes or if you have certain conditions. Talk to your health care provider about which screenings and vaccines you need and how often you need them. This information is not intended to replace advice given to you by your health care provider. Make sure you discuss  any questions you have with your health care  provider. Document Released: 03/08/2015 Document Revised: 10/30/2015 Document Reviewed: 12/11/2014 Elsevier Interactive Patient Education  2017 [place] Prevention in the Home Falls can cause injuries. They can happen to people of all ages. There are many things you can do to make your home safe and to help prevent falls. What can I do on the outside of my home? Regularly fix the edges of walkways and driveways and fix any cracks. Remove anything that might make you trip as you walk through a door, such as a raised step or threshold. Trim any bushes or trees on the path to your home. Use bright outdoor lighting. Clear any walking paths of anything that might make someone trip, such as rocks or tools. Regularly check to see if handrails are loose or broken. Make sure that both sides of any steps have handrails. Any raised decks and porches should have guardrails on the edges. Have any leaves, snow, or ice cleared regularly. Use sand or salt on walking paths during winter. Clean up any spills in your garage right away. This includes oil or grease spills. What can I do in the bathroom? Use night lights. Install grab bars by the toilet and in the tub and shower. Do not use towel bars as grab bars. Use non-skid mats or decals in the tub or shower. If you need to sit down in the shower, use a plastic, non-slip stool. Keep the floor dry. Clean up any water that spills on the floor as soon as it happens. Remove soap buildup in the tub or shower regularly. Attach bath mats securely with double-sided non-slip rug tape. Do not have throw rugs and other things on the floor that can make you trip. What can I do in the bedroom? Use night lights. Make sure that you have a light by your bed that is easy to reach. Do not use any sheets or blankets that are too big for your bed. They should not hang down onto the floor. Have a firm chair that has side arms. You can use this for support while  you get dressed. Do not have throw rugs and other things on the floor that can make you trip. What can I do in the kitchen? Clean up any spills right away. Avoid walking on wet floors. Keep items that you use a lot in easy-to-reach places. If you need to reach something above you, use a strong step stool that has a grab bar. Keep electrical cords out of the way. Do not use floor polish or wax that makes floors slippery. If you must use wax, use non-skid floor wax. Do not have throw rugs and other things on the floor that can make you trip. What can I do with my stairs? Do not leave any items on the stairs. Make sure that there are handrails on both sides of the stairs and use them. Fix handrails that are broken or loose. Make sure that handrails are as long as the stairways. Check any carpeting to make sure that it is firmly attached to the stairs. Fix any carpet that is loose or worn. Avoid having throw rugs at the top or bottom of the stairs. If you do have throw rugs, attach them to the floor with carpet tape. Make sure that you have a light switch at the top of the stairs and the bottom of the stairs. If you do not have them, ask someone to add  them for you. What else can I do to help prevent falls? Wear shoes that: Do not have high heels. Have rubber bottoms. Are comfortable and fit you well. Are closed at the toe. Do not wear sandals. If you use a stepladder: Make sure that it is fully opened. Do not climb a closed stepladder. Make sure that both sides of the stepladder are locked into place. Ask someone to hold it for you, if possible. Clearly mark and make sure that you can see: Any grab bars or handrails. First and last steps. Where the edge of each step is. Use tools that help you move around (mobility aids) if they are needed. These include: Canes. Walkers. Scooters. Crutches. Turn on the lights when you go into a dark area. Replace any light bulbs as soon as they burn  out. Set up your furniture so you have a clear path. Avoid moving your furniture around. If any of your floors are uneven, fix them. If there are any pets around you, be aware of where they are. Review your medicines with your doctor. Some medicines can make you feel dizzy. This can increase your chance of falling. Ask your doctor what other things that you can do to help prevent falls. This information is not intended to replace advice given to you by your health care provider. Make sure you discuss any questions you have with your health care provider. Document Released: 12/06/2008 Document Revised: 07/18/2015 Document Reviewed: 03/16/2014 Elsevier Interactive Patient Education  2017 [employer].

## 2021-12-31 NOTE — Progress Notes (Signed)
Virtual Visit via Telephone Note  I connected with  [name] on 12/31/21 at  9:15 AM EST by telephone and verified that I am speaking with the correct person using two identifiers.  Location: Patient: home Provider: [place] Persons participating in the virtual visit: [place]   I discussed the limitations, risks, security and privacy concerns of performing an evaluation and management service by telephone and the availability of in person appointments. The patient expressed understanding and agreed to proceed.  Interactive audio and video telecommunications were attempted between this nurse and patient, however failed, due to patient having technical difficulties OR patient did not have access to video capability.  We continued and completed visit with audio only.  Some vital signs may be absent or patient reported.   Dionisio David, LPN  Subjective:   [name] is a 61 y.o. male who presents for Medicare Annual/Subsequent preventive examination.  Review of Systems     Cardiac Risk Factors include: advanced age (>82mn, >>34women);male gender;hypertension     Objective:    There were no vitals filed for this visit. There is no height or weight on file to calculate BMI.     12/31/2021    9:22 AM 04/03/2021    7:00 AM 04/01/2021   11:03 AM 12/20/2020    9:00 AM 05/12/2016   11:28 AM 05/04/2016    7:35 AM 04/29/2016   10:23 AM  Advanced Directives  Does Patient Have a Medical Advance Directive? No No No No No No No  Would patient like information on creating a medical advance directive? No - Patient declined No - Patient declined No - Patient declined No - Patient declined Yes (MAU/Ambulatory/Procedural Areas - Information given) No - Patient declined No - Patient declined    Current Medications (verified) Outpatient Encounter Medications as of 12/31/2021  Medication Sig   acetaminophen (TYLENOL) 500 MG tablet Take 500 mg by mouth every 6  (six) hours as needed for moderate pain or headache.   albuterol (VENTOLIN HFA) 108 (90 Base) MCG/ACT inhaler Inhale 2 puffs into the lungs every 6 (six) hours as needed for wheezing or shortness of breath.   alendronate (FOSAMAX) 70 MG tablet Take 1 tablet (70 mg total) by mouth every Sunday. Take with a full glass of water on an empty stomach.   aspirin EC 325 MG EC tablet Take 1 tablet (325 mg total) by mouth daily.   azelastine (ASTELIN) 0.1 % nasal spray PLACE 2 SPRAYS INTO BOTH NOSTRILS TWICE DAILY AS NEEDED   calcium carbonate (TUMS - DOSED IN MG ELEMENTAL CALCIUM) 500 MG chewable tablet Chew 1 tablet by mouth daily.   Cholecalciferol (VITAMIN D) 50 MCG (2000 UT) tablet Take 2,000 Units by mouth daily.   empagliflozin (JARDIANCE) 10 MG TABS tablet Take 1 tablet (10 mg total) by mouth daily before breakfast.   fluticasone (FLONASE) 50 MCG/ACT nasal spray PLACE ONE OR TWO SPRAYS INTO BOTH NOSTRILS DAILY AS NEEDED.   levothyroxine (SYNTHROID) 175 MCG tablet Take 1 tablet (175 mcg total) by mouth daily before breakfast.   metFORMIN (GLUCOPHAGE-XR) 500 MG 24 hr tablet Take 2 tablets (1,000 mg total) by mouth in the morning and at bedtime.   metoprolol tartrate (LOPRESSOR) 25 MG tablet TAKE 1 TABLET BY MOUTH TWICE A DAY   metroNIDAZOLE (METROGEL) 1 % gel Apply 1 application topically daily as needed (rosacea).   mycophenolate (CELLCEPT) 250 MG capsule Take 250 mg by mouth 2 (two) times  daily.   NON FORMULARY Pt uses a cpap nightly   OneTouch Delica Lancets 25K MISC USE TO CHECK SUGAR DAILY   ONETOUCH ULTRA test strip USE TO CHECK SUGAR DAILY   pravastatin (PRAVACHOL) 40 MG tablet Take 40 mg by mouth every evening.   sertraline (ZOLOFT) 50 MG tablet Take 1 tablet (50 mg total) by mouth daily.   tacrolimus (PROGRAF) 1 MG capsule Take 2 mg by mouth 2 (two) times daily.   No facility-administered encounter medications on file as of 12/31/2021.    Allergies (verified) Watermelon flavor    History: Past Medical History:  Diagnosis Date   Allergy    Asthma    Bicuspid aortic valve    sees dr Harrington Challenger   Cirrhosis [facility])    Coronary artery disease    DM2 (diabetes mellitus, type 2) ([place])    Fatty liver    with h/o elevated LFT's   GERD (gastroesophageal reflux disease)    Heart murmur    Hypertension    Itching    all over last few months   Jaundice    Liver transplant recipient ([place])    [facility]/16/[facility]   Migraine with aura    OSA (obstructive sleep apnea) 09/14/2011   PSG 11/08/11>>AHI 31.6, SpO2 low 85%. wears CPAP, pt does not know settings   Pulmonary embolism ([place])    2022   Thyroid disease    Past Surgical History:  Procedure Laterality Date   AORTIC VALVE REPLACEMENT N/A 04/03/2021   Procedure: AORTIC VALVE REPLACEMENT (AVR) USING 27 MM INSPIRIS RESILIA  AORTIC VALVE;  Surgeon: Gaye Pollack, MD;  Location: [place];  Service: Open Heart Surgery;  Laterality: N/A;   APPENDECTOMY  11/2007   Emergency   BIOPSY THYROID  08/19/2007   Attempted, no tissue obtained   CARDIAC CATHETERIZATION  03/21/2018   CARDIOVASCULAR STRESS TEST  04/2005   Negative 06/05   CHOLECYSTECTOMY     CORONARY ARTERY BYPASS GRAFT N/A 04/03/2021   Procedure: CORONARY ARTERY BYPASS GRAFTING (CABG) x ONE ON CARDIOPULMONARY BYPASS. LIMA TO LAD;  Surgeon: Gaye Pollack, MD;  Location: [facility] OR;  Service: Open Heart Surgery;  Laterality: N/A;   DOPPLER ECHOCARDIOGRAPHY  07/2002   ESOPHAGEAL BANDING N/A 05/04/2016   Procedure: ESOPHAGEAL BANDING;  Surgeon: [name], MD;  Location: [facility];  Service: Endoscopy;  Laterality: N/A;   ESOPHAGOGASTRODUODENOSCOPY (EGD) WITH PROPOFOL N/A 05/04/2016   Procedure: ESOPHAGOGASTRODUODENOSCOPY (EGD) WITH PROPOFOL;  Surgeon: [name], MD;  Location: [facility];  Service: Endoscopy;  Laterality: N/A;   LIVER TRANSPLANT     09/16/2018 at [facility]   RIGHT/LEFT HEART CATH AND CORONARY ANGIOGRAPHY N/A 12/20/2020   Procedure: RIGHT/LEFT HEART CATH  AND CORONARY ANGIOGRAPHY;  Surgeon: [name], MD;  Location: [place] [facility];  Service: Cardiovascular;  Laterality: N/A;   TEE WITHOUT CARDIOVERSION N/A 04/03/2021   Procedure: TRANSESOPHAGEAL ECHOCARDIOGRAM (TEE);  Surgeon: Gaye Pollack, MD;  Location: [place];  Service: Open Heart Surgery;  Laterality: N/A;   Family History  Problem Relation Age of Onset   Asthma Mother    Cancer Father        Died when pt was 14 of CA with mets, site unknown, COPD   COPD Father    Heart disease Sister        Heart stopped   Liver disease Sister        "gene" for liver disease   Colon cancer Maternal Grandmother  Heart disease Maternal Grandfather        MI, 63 YOA   Prostate cancer Maternal Uncle    Esophageal cancer Neg Hx    Rectal cancer Neg Hx    Stomach cancer Neg Hx    Social History   Socioeconomic History   Marital status: Legally Separated    Spouse name: Not on file   Number of children: 1   Years of education: 12   Highest education level: High school graduate  Occupational History   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 [place]   Occupation: Disabled  Tobacco Use   Smoking status: Never    Passive exposure: Past   Smokeless tobacco: Never  Vaping Use   Vaping Use: Never used  Substance and Sexual Activity   Alcohol use: No   Drug use: No   Sexual activity: Not on file  Other Topics Concern   Not on file  Social History Narrative   Separated   1 child and 2 stepchildren   Prev ran a car shop   Social Determinants of Health   Financial Resource Strain: [place]  (12/31/2021)   Overall Financial Resource Strain (CARDIA)    Difficulty of Paying Living Expenses: Not very hard  Food Insecurity: No Food Insecurity (12/31/2021)   Hunger Vital Sign    Worried About Running Out of Food in the Last Year: Never true    [place] in the Last Year: Never true  Transportation  Needs: No Transportation Needs (12/31/2021)   PRAPARE - Hydrologist (Medical): No    Lack of Transportation (Non-Medical): No  Physical Activity: Inactive (12/31/2021)   Exercise Vital Sign    Days of Exercise per Week: 0 days    Minutes of Exercise per Session: 0 min  Stress: No Stress Concern Present (12/31/2021)   [place]    Feeling of Stress : Not at all  Social Connections: Moderately Isolated (12/31/2021)   Social Connection and Isolation Panel [NHANES]    Frequency of Communication with Friends and Family: More than three times a week    Frequency of Social Gatherings with Friends and Family: Once a week    Attends Religious Services: More than 4 times per year    Active Member of [employer] or Organizations: No    Attends Music therapist: Never    Marital Status: Separated    Tobacco Counseling Counseling given: Not Answered   Clinical Intake:  Pre-visit preparation completed: Yes  Pain : No/denies pain     Nutritional Risks: None Diabetes: Yes CBG done?: No Did pt. bring in CBG monitor from home?: No  How often do you need to have someone help you when you read instructions, pamphlets, or other written materials from your doctor or pharmacy?: 1 - Never  Diabetic?yes Nutrition Risk Assessment:  Has the patient had any N/V/D within the last 2 months?  No  Does the patient have any non-healing wounds?  No  Has the patient had any unintentional weight loss or weight gain?  No   Diabetes:  Is the patient diabetic?  Yes  If diabetic, was a CBG obtained today?  No  Did the patient bring in their glucometer from home?  No  How often do you monitor your CBG's? never.   Financial Strains and Diabetes Management:  Are you  having any financial strains with the device, your supplies or your medication? No .  Does the patient want to be seen by Chronic Care  Management for management of their diabetes?  No  Would the patient like to be referred to a Nutritionist or for Diabetic Management?  No   Diabetic Exams:  Diabetic Eye Exam: Completed 01/10/20. Overdue for diabetic eye exam. Pt has been advised about the importance in completing this exam.   Diabetic Foot Exam: Completed 11/18/20. Pt has been advised about the importance in completing this exam.   Interpreter Needed?: No  Information entered by :: Kirke Shaggy, LPN   Activities of Daily Living    12/31/2021    9:23 AM 04/01/2021   11:05 AM  In your present state of health, do you have any difficulty performing the following activities:  Hearing? 0   Vision? 0   Difficulty concentrating or making decisions? 0   Walking or climbing stairs? 1   Dressing or bathing? 0   Doing errands, shopping? 0 0  Preparing Food and eating ? N   Using the Toilet? N   In the past six months, have you accidently leaked urine? N   Do you have problems with loss of bowel control? N   Managing your Medications? N   Managing your Finances? N   Housekeeping or managing your Housekeeping? N     Patient Care Team: [name], MD as PCP - General Fay Records, MD as PCP - Cardiology (Cardiology) Charlton Haws, [facility] as Pharmacist (Pharmacist)  Indicate any recent Medical Services you may have received from other than [facility] providers in the past year (date may be approximate).     Assessment:   This is a routine wellness examination for [place].  Hearing/Vision screen Hearing Screening - Comments:: No aids Vision Screening - Comments:: Readers- [facility]  Dietary issues and exercise activities discussed: Current Exercise Habits: The patient does not participate in regular exercise at present   Goals Addressed             This Visit's Progress    DIET - EAT MORE FRUITS AND VEGETABLES         Depression Screen    12/31/2021    9:21 AM 08/27/2021   10:13 AM  07/03/2021    3:05 PM 11/18/2020    9:03 AM 01/19/2017   10:52 AM  PHQ 2/9 Scores  PHQ - 2 Score 0 0 0 0 0  PHQ- 9 Score 0   0   Exception Documentation     Other- indicate reason in comment box  Not completed     Stress    Fall Risk    12/31/2021    9:23 AM 07/03/2021   10:30 AM 11/18/2020    9:03 AM  [place] in the past year? 0 0 0  Number falls in past yr: 0 0 0  Injury with Fall? 0 0 0  Risk for fall due to : No Fall Risks Other (Comment) No Fall Risks  Risk for fall due to: Comment  Pt does report lightheadedness after walk test   Follow up Falls prevention discussed;Falls evaluation completed Falls evaluation completed Falls evaluation completed    FALL RISK PREVENTION PERTAINING TO THE HOME:  Any stairs in or around the home? Yes  If so, are there any without handrails? No  Home free of loose throw rugs in walkways, pet beds, electrical cords, etc? Yes  Adequate lighting in your home to reduce risk of falls? Yes   ASSISTIVE DEVICES UTILIZED TO PREVENT FALLS:  Life alert? No  Use of a cane, walker or w/c? No  Grab bars in the bathroom? No  Shower chair or bench in shower? No  Elevated toilet seat or a handicapped toilet? Yes   Cognitive Function:        12/31/2021    9:24 AM  6CIT Screen  What Year? 0 points  What month? 0 points  What time? 0 points  Count back from 20 0 points  Months in reverse 0 points  Repeat phrase 0 points  Total Score 0 points    Immunizations Immunization History  Administered Date(s) Administered   Hepatitis A, Adult 08/28/2021   Hepb-cpg 08/28/2021   Influenza Split 11/18/2011   Influenza Whole 12/27/2007, 10/29/2009   Influenza,inj,Quad PF,6+ Mos 01/25/2014, 02/11/2015, 01/19/2017, 01/11/2018, 10/28/2018, 11/10/2019, 11/18/2020   Influenza-Unspecified 01/11/2018, 11/23/2018   PFIZER Comirnaty(Gray Top)Covid-19 Tri-Sucrose Vaccine 09/17/2020   PFIZER(Purple Top)SARS-COV-2 Vaccination 08/31/2019, 09/21/2019,  09/17/2020   [place] Pediatric Vaccine(33mo to <531yr 03/10/2021   Pneumococcal Conjugate-13 04/17/2019   Pneumococcal Polysaccharide-23 01/11/2018   Td 03/05/1997, 10/09/2008   Tdap 11/10/2019   Zoster Recombinat (Shingrix) 09/17/2020, 02/03/2021    TDAP status: Up to date  Flu Vaccine status: Due, Education has been provided regarding the importance of this vaccine. Advised may receive this vaccine at local pharmacy or Health Dept. Aware to provide a copy of the vaccination record if obtained from local pharmacy or Health Dept. Verbalized acceptance and understanding.  Pneumococcal vaccine status: Up to date  Covid-19 vaccine status: Completed vaccines  Qualifies for Shingles Vaccine? Yes   Zostavax completed No   Shingrix Completed?: Yes  Screening Tests Health Maintenance  Topic Date Due   OPHTHALMOLOGY EXAM  01/09/2021   COVID-19 Vaccine (5 - Pfizer risk series) 05/05/2021   INFLUENZA VACCINE  09/23/2021   Diabetic kidney evaluation - Urine ACR  11/18/2021   FOOT EXAM  11/18/2021   HEMOGLOBIN A1C  02/05/2022   Diabetic kidney evaluation - GFR measurement  08/07/2022   COLONOSCOPY (Pts [id number] coverage will need to be confirmed)  10/12/2022   Medicare Annual Wellness (AWV)  01/01/2023   TETANUS/TDAP  11/09/2029   Hepatitis C Screening  Completed   HIV Screening  Completed   Zoster Vaccines- Shingrix  Completed   HPV VACCINES  Aged Out    Health Maintenance  Health Maintenance Due  Topic Date Due   OPHTHALMOLOGY EXAM  01/09/2021   COVID-19 Vaccine (5 - Pfizer risk series) 05/05/2021   INFLUENZA VACCINE  09/23/2021   Diabetic kidney evaluation - Urine ACR  11/18/2021   FOOT EXAM  11/18/2021    Colorectal cancer screening: Type of screening: Colonoscopy. Completed 10/11/12. Repeat every 10 years  Lung Cancer Screening: (Low Dose CT Chest recommended if Age 63-102-80ars, 30 pack-year currently smoking OR have quit w/in 15years.) does not  qualify.   Additional Screening:  Hepatitis C Screening: does qualify; Completed 04/09/16  Vision Screening: Recommended annual ophthalmology exams for early detection of glaucoma and other disorders of the eye. Is the patient up to date with their annual eye exam?  Yes  Who is the provider or what is the name of the office in which the patient attends annual eye exams? [facility] is not [facility] with a provider, would they like to be referred to a provider to establish care? No .  Dental Screening: Recommended annual dental exams for proper oral hygiene  Community Resource Referral / Chronic Care Management: CRR required this visit?  No   CCM required this visit?  No      Plan:     I have personally reviewed and noted the following in the patient's chart:   Medical and social history Use of alcohol, tobacco or illicit drugs  Current medications and supplements including opioid prescriptions. Patient is not currently taking opioid prescriptions. Functional ability and status Nutritional status Physical activity Advanced directives List of other physicians Hospitalizations, surgeries, and ER visits in previous 12 months Vitals Screenings to include cognitive, depression, and falls Referrals and appointments  In addition, I have reviewed and discussed with patient certain preventive protocols, quality metrics, and best practice recommendations. A written personalized care plan for preventive services as well as general preventive health recommendations were provided to patient.     Dionisio David, LPN   12/29/5206   Nurse Notes: none

## 2022-01-01 ENCOUNTER — Encounter: Payer: Self-pay | Admitting: Family Medicine

## 2022-01-01 ENCOUNTER — Ambulatory Visit (INDEPENDENT_AMBULATORY_CARE_PROVIDER_SITE_OTHER): Payer: Medicare Other | Admitting: Family Medicine

## 2022-01-01 VITALS — BP 134/82 | HR 66 | Temp 97.3°F | Ht 69.0 in | Wt 207.0 lb

## 2022-01-01 DIAGNOSIS — E039 Hypothyroidism, unspecified: Secondary | ICD-10-CM

## 2022-01-01 DIAGNOSIS — M899 Disorder of bone, unspecified: Secondary | ICD-10-CM | POA: Diagnosis not present

## 2022-01-01 DIAGNOSIS — E785 Hyperlipidemia, unspecified: Secondary | ICD-10-CM | POA: Diagnosis not present

## 2022-01-01 DIAGNOSIS — Z125 Encounter for screening for malignant neoplasm of prostate: Secondary | ICD-10-CM

## 2022-01-01 DIAGNOSIS — M8580 Other specified disorders of bone density and structure, unspecified site: Secondary | ICD-10-CM | POA: Diagnosis not present

## 2022-01-01 DIAGNOSIS — Z23 Encounter for immunization: Secondary | ICD-10-CM | POA: Diagnosis not present

## 2022-01-01 DIAGNOSIS — Z7189 Other specified counseling: Secondary | ICD-10-CM

## 2022-01-01 DIAGNOSIS — E119 Type 2 diabetes mellitus without complications: Secondary | ICD-10-CM

## 2022-01-01 DIAGNOSIS — Z944 Liver transplant status: Secondary | ICD-10-CM | POA: Diagnosis not present

## 2022-01-01 DIAGNOSIS — Z0000 Encounter for general adult medical examination without abnormal findings: Secondary | ICD-10-CM

## 2022-01-01 DIAGNOSIS — R21 Rash and other nonspecific skin eruption: Secondary | ICD-10-CM

## 2022-01-01 NOTE — Progress Notes (Signed)
Diabetes:  Using medications without difficulties: yes Hypoglycemic episodes: no Hyperglycemic episodes:no Feet problems:no Blood Sugars averaging: not checked often.  eye exam within last year: done this year, [facility].   He is on jardiance in the meantime, awaiting patient assistance.   Metformin 2 tabs BID.    Metrogel used prn for rosacea.  No ADE on med.   Liver transplant status d/w pt.  Still on tacrolimus and cellcept at baseline.  Followed by [facility].  Outside labs d/w pt.  No abd pain except for occ discomfort, not consistent, it self resolved.  No jaundice.  No FCNAVD.  No black or bloody stools.    Mood d/w pt.  Mood is "okay."  Compliant with SSRI.  No ADE on med.    Elevated Cholesterol: Using medications without problems: yes Muscle aches: likely not from statin, some aches at baseline.   Diet compliance: d/w pt.   Exercise: d/w pt. He wants to get back to walking more.    Hypothyroidism.  TSH wnl recently.  No dysphagia.  Compliant.   On alendronate at baseline.  Occ missed dose, d/w pt about scheduling and routine cautions.  No ADE on med.    Tetanus 2021 Flu shot 2023 Shingles prev done PNA prev done Covid vaccine prev done.   FH prostate cancer- PSA screening d/w pt, pending.   FH colon cancer.  Colonoscopy wnl 2014 Living will d/w pt.  Wife and his daughter Marye Round would be designated equally.   HCV and HIV screening prev done.  Has routine cardiology f/u.  No CP.  Not SOB.    PMH and SH reviewed  Meds, vitals, and allergies reviewed.   ROS: Per HPI unless specifically indicated in ROS section   GEN: nad, alert and oriented HEENT: ncat NECK: supple w/o LA CV: rrr. PULM: ctab, no inc wob ABD: soft, +bs, healed abd well scars noted.   EXT: no edema SKIN: no acute rash  Diabetic foot exam: Normal inspection No skin breakdown No calluses  Normal DP pulses Normal sensation to light touch and monofilament Nails normal

## 2022-01-01 NOTE — Patient Instructions (Signed)
Go to the lab on the way out.   If you have mychart we'll likely use that to update you.    Update me as needed.   We can make follow up plans after I see your labs.

## 2022-01-02 LAB — VITAMIN D 25 HYDROXY (VIT D DEFICIENCY, FRACTURES): Vit D, 25-Hydroxy: 32.8 ng/mL (ref 30.0–100.0)

## 2022-01-02 LAB — PSA: Prostate Specific Ag, Serum: 0.7 ng/mL (ref 0.0–4.0)

## 2022-01-02 LAB — LIPID PANEL
Chol/HDL Ratio: 4 ratio (ref 0.0–5.0)
Cholesterol, Total: 113 mg/dL (ref 100–199)
HDL: 28 mg/dL — ABNORMAL LOW (ref >39)
LDL Chol Calc (NIH): 22 mg/dL (ref 0–99)
Triglycerides: 465 mg/dL — ABNORMAL HIGH (ref 0–149)
VLDL Cholesterol Cal: 63 mg/dL — ABNORMAL HIGH (ref 5–40)

## 2022-01-02 LAB — HEMOGLOBIN A1C
Est. average glucose Bld gHb Est-mCnc: 160 mg/dL
Hgb A1c MFr Bld: 7.2 % — ABNORMAL HIGH (ref 4.8–5.6)

## 2022-01-02 NOTE — Telephone Encounter (Signed)
Ppw was faxed yesterday

## 2022-01-04 DIAGNOSIS — Z0000 Encounter for general adult medical examination without abnormal findings: Secondary | ICD-10-CM | POA: Insufficient documentation

## 2022-01-04 NOTE — Assessment & Plan Note (Signed)
Living will d/w pt.  Wife and his daughter [name] would be designated equally.

## 2022-01-04 NOTE — Assessment & Plan Note (Signed)
Continue alendronate.  No ADE on med.  Discussed scheduling medication.

## 2022-01-04 NOTE — Assessment & Plan Note (Signed)
Continue MetroGel as needed.

## 2022-01-04 NOTE — Assessment & Plan Note (Signed)
Continue levothyroxine.  Recent TSH normal.

## 2022-01-04 NOTE — Assessment & Plan Note (Signed)
Continue pravastatin.  See notes on labs.  Discussed diet and exercise.

## 2022-01-04 NOTE — Assessment & Plan Note (Signed)
He is on jardiance in the meantime, awaiting patient assistance.   Metformin 2 tabs BID.   No change in medication.  Continue work on diet and exercise.

## 2022-01-04 NOTE — Assessment & Plan Note (Signed)
Tetanus 2021 Flu shot 2023 Shingles prev done PNA prev done Covid vaccine prev done.   FH prostate cancer- PSA screening d/w pt, pending.   FH colon cancer.  Colonoscopy wnl 2014 Living will d/w pt.  Wife and his daughter Marye Round would be designated equally.   HCV and HIV screening prev done.

## 2022-01-04 NOTE — Assessment & Plan Note (Signed)
Not jaundiced or having abdominal pain.  Continue with tacrolimus and CellCept.  Labs discussed with patient.  He will update me as needed.  He is going to follow-up with transplant center.

## 2022-01-12 ENCOUNTER — Encounter: Payer: Self-pay | Admitting: Family Medicine

## 2022-01-12 DIAGNOSIS — G4733 Obstructive sleep apnea (adult) (pediatric): Secondary | ICD-10-CM | POA: Diagnosis not present

## 2022-01-13 NOTE — Unmapped (Signed)
[facility] Shared [facility] Clinical Assessment & Refill Coordination Note    Anthony Alvarado, [place]: 06-Jun-1960  Phone: [phone] (home)     All above HIPAA information was verified with patient.     Was a Nurse, learning disability used for this call? No    Specialty Medication(s):   Transplant: mycophenolate mofetil 250mg  and tacrolimus 1mg      Current Outpatient Medications   Medication Sig Dispense Refill    alendronate (FOSAMAX) 70 MG tablet Take 1 tablet (70 mg total) by mouth every seven (7) days. 4 tablet 2    alendronate (FOSAMAX) 70 MG tablet Take 1 tablet (70 mg total) by mouth Every Sunday. Take with a full glass of water on an empty stomach. (Patient taking differently: Take 1 tablet (70 mg total) by mouth Every Sunday. Pt reports that he has missed a few doses.) 13 tablet 3    aspirin 325 MG tablet Take 1 tablet (325 mg total) by mouth daily.      azelastine (ASTELIN) 137 mcg (0.1 %) nasal spray 1 spray into each nostril daily as needed.      blood sugar diagnostic (ONETOUCH ULTRA TEST) Strp USE TO CHECK SUGAR DAILY      blood sugar diagnostic Strp Use as directed Three (3) times a day before meals. 90 each 11    calcium carbonate (TUMS) 200 mg calcium (500 mg) chewable tablet Chew 1 tablet (200 mg of elem calcium total) daily. Pt reports taking every other day.      FEROCON 110-0.5 mg capsule TAKE 1 CAPSULE BY MOUTH 2 (TWO) TIMES DAILY AFTER A MEAL. NOT COVERED BY INSURANCE.      fluticasone propionate (FLONASE) 50 mcg/actuation nasal spray PLACE ONE OR TWO SPRAYS INTO BOTH NOSTRILS DAILY AS NEEDED.      JARDIANCE 10 mg tablet Pt reports that he has not taken this medication in about 1 month due to the price.      lancets 33 gauge Misc 1 each by Miscellaneous route Three (3) times a day before meals. 100 each 11    levothyroxine (SYNTHROID) 150 MCG tablet Take 1 tablet (150 mcg total) by mouth daily. 30 tablet 11    metFORMIN (GLUCOPHAGE-XR) 500 MG 24 hr tablet Take 2 tablets (1,000 mg total) by mouth every morning AND 2 tablets (1,000 mg total) at bedtime. 360 tablet 3    metoprolol tartrate (LOPRESSOR) 25 MG tablet Take 1 tablet (25 mg total) by mouth in the morning.      mycophenolate (CELLCEPT) 250 mg capsule Take 1 capsule (250 mg total) by mouth Two (2) times a day. 60 capsule 11    pravastatin (PRAVACHOL) 40 MG tablet Take 1 tablet (40 mg total) by mouth every evening. 90 tablet 3    sertraline (ZOLOFT) 50 MG tablet Take 1 tablet (50 mg total) by mouth daily. 90 tablet 3    tacrolimus (PROGRAF) 1 MG capsule Take 2 capsules (2 mg total) by mouth two (2) times a day. 120 capsule 11    testosterone (ANDROGEL) 1 % (25 mg/2.5gram) GlPk APPLY 2.5G (1 PACKET) TO EACH SHOULDER/UPPER ARM DAILY FOR A TOTAL DAILY DOSE OF 5G 150 packet 0     No current facility-administered medications for this visit.     Facility-Administered Medications Ordered in Other Visits   Medication Dose Route Frequency Provider Last Rate Last Admin    diazePAM (VALIUM) 5 mg/mL injection  Changes to medications: [name] reports no changes at this time.    Allergies   Allergen Reactions    Watermelon Flavor      Mouth itching       Changes to allergies: No    SPECIALTY MEDICATION ADHERENCE     Mycophenolate 250 mg: 7 days of medicine on hand   Tacrolimus 1 mg: 7 days of medicine on hand     Medication Adherence    Patient reported X missed doses in the last month: 1  Specialty Medication: Mycophenolate 250mg   Patient is on additional specialty medications: Yes  Additional Specialty Medications: Tacrolimus 1mg   Patient Reported Additional Medication X Missed Doses in the Last Month: 0  Patient is on more than two specialty medications: No                            Specialty medication(s) dose(s) confirmed: Regimen is correct and unchanged.     Are there any concerns with adherence? No    Adherence counseling provided? Not needed    CLINICAL MANAGEMENT AND INTERVENTION      Clinical Benefit Assessment:    Do you feel the medicine is effective or helping your condition? Yes    Clinical Benefit counseling provided? Not needed    Adverse Effects Assessment:    Are you experiencing any side effects? No    Are you experiencing difficulty administering your medicine? No    Quality of Life Assessment:    Quality of Life    Rheumatology  Oncology  Dermatology  Cystic Fibrosis          How many days over the past month did your liver transplant  keep you from your normal activities? For example, brushing your teeth or getting up in the morning. 0    Have you discussed this with your provider? Not needed    Acute Infection Status:    Acute infections noted within Epic:  No active infections  Patient reported infection: None    Therapy Appropriateness:    Is therapy appropriate and patient progressing towards therapeutic goals? Yes, therapy is appropriate and should be continued    DISEASE/MEDICATION-SPECIFIC INFORMATION      N/A    Solid Organ Transplant: Not Applicable    PATIENT SPECIFIC NEEDS     Does the patient have any physical, cognitive, or cultural barriers? No    Is the patient high risk? Yes, patient is taking a REMS drug. Medication is dispensed in compliance with REMS program    Did the patient require a clinical intervention? No    Does the patient require physician intervention or other additional services (i.e., nutrition, smoking cessation, social work)? No    SOCIAL DETERMINANTS OF HEALTH     At the [facility], we have learned that life circumstances - like trouble affording food, housing, utilities, or transportation can affect the health of many of our patients.   That is why we wanted to ask: are you currently experiencing any life circumstances that are negatively impacting your health and/or quality of life? No    Social Determinants of Health     Financial Resource Strain: Not on file   Internet Connectivity: Not on file   Food Insecurity: Not on file   Tobacco Use: Low Risk  (08/28/2021)    Patient History Smoking Tobacco Use: Never     Smokeless Tobacco Use: Never     Passive Exposure: Not on  file   Housing/Utilities: Not on file   Alcohol Use: Not on file   Transportation Needs: Not on file   Substance Use: Not on file   Health Literacy: Not on file   Physical Activity: Not on file   Interpersonal Safety: Not on file   Stress: Not on file   Intimate Partner Violence: Not on file   Depression: Not on file   Social Connections: Not on file       Would you be willing to receive help with any of the needs that you have identified today? Not applicable       SHIPPING     Specialty Medication(s) to be Shipped:   Transplant: mycophenolate mofetil 250mg  and tacrolimus 1mg     Other medication(s) to be shipped: No additional medications requested for fill at this time     Changes to insurance: No    Delivery Scheduled: Yes, Expected medication delivery date: 01/20/22.     Medication will be delivered via UPS to the confirmed prescription address in [facility].    The patient will receive a drug information handout for each medication shipped and additional FDA Medication Guides as required.  Verified that patient has previously received a Conservation officer, historic buildings and a Surveyor, mining.    The patient or caregiver noted above participated in the development of this care plan and knows that they can request review of or adjustments to the care plan at any time.      All of the patient's questions and concerns have been addressed.    [name], [facility]   [facility] Shared [facility] Specialty Pharmacist

## 2022-01-19 MED FILL — TACROLIMUS 1 MG CAPSULE, IMMEDIATE-RELEASE: ORAL | 30 days supply | Qty: 120 | Fill #7

## 2022-01-19 MED FILL — MYCOPHENOLATE MOFETIL 250 MG CAPSULE: ORAL | 30 days supply | Qty: 60 | Fill #6

## 2022-02-03 ENCOUNTER — Telehealth: Payer: Self-pay

## 2022-02-03 NOTE — Telephone Encounter (Addendum)
Contacted [employer] and missing information is a new RX for the Jardiance. Pharmacy is unable to read the date. The manufacturer has everything else they need for renewal 2024.  Charlene Brooke, CPP notified  Avel Sensor, [place]  [phone]

## 2022-02-03 NOTE — Telephone Encounter (Signed)
Per chart review office staff has already re-faxed completed [employer] application on 15/90/17. We will follow up with [facility] C[facility] for next steps.

## 2022-02-03 NOTE — Chronic Care Management (AMB) (Addendum)
Chronic Care Management Pharmacy Assistant   Name: [name]  MRN: [id number] DOB: 1960-07-01  Reason for Encounter:  Diabetes Disease State   Recent office visits:  03/03/21-Graham Duncan,MD(PCP)-AWV, new A1c 7.2 (Your A1c is still elevated but better than 5 months ago.  Your triglycerides are still elevated but the treatment here would be to continue your current medications and continue working on your sugar.  Your PSA is normal, as is your vitamin D.)  DM foot UTD, discussed screenings, vaccines. F/u 6 months with labs.  Recent consult visits:  None since last CCM contact   Hospital visits:  None in previous 6 months  Medications: Outpatient Encounter Medications as of 02/03/2022  Medication Sig   acetaminophen (TYLENOL) 500 MG tablet Take 500 mg by mouth every 6 (six) hours as needed for moderate pain or headache.   albuterol (VENTOLIN HFA) 108 (90 Base) MCG/ACT inhaler Inhale 2 puffs into the lungs every 6 (six) hours as needed for wheezing or shortness of breath.   alendronate (FOSAMAX) 70 MG tablet Take 1 tablet (70 mg total) by mouth every Sunday. Take with a full glass of water on an empty stomach.   aspirin EC 325 MG EC tablet Take 1 tablet (325 mg total) by mouth daily.   azelastine (ASTELIN) 0.1 % nasal spray PLACE 2 SPRAYS INTO BOTH NOSTRILS TWICE DAILY AS NEEDED   calcium carbonate (TUMS - DOSED IN MG ELEMENTAL CALCIUM) 500 MG chewable tablet Chew 1 tablet by mouth daily.   Cholecalciferol (VITAMIN D) 50 MCG (2000 UT) tablet Take 2,000 Units by mouth daily.   empagliflozin (JARDIANCE) 10 MG TABS tablet Take 1 tablet (10 mg total) by mouth daily before breakfast.   fluticasone (FLONASE) 50 MCG/ACT nasal spray PLACE ONE OR TWO SPRAYS INTO BOTH NOSTRILS DAILY AS NEEDED.   levothyroxine (SYNTHROID) 175 MCG tablet Take 1 tablet (175 mcg total) by mouth daily before breakfast.   metFORMIN (GLUCOPHAGE-XR) 500 MG 24 hr tablet Take 2 tablets (1,000 mg total) by mouth in the  morning and at bedtime.   metoprolol tartrate (LOPRESSOR) 25 MG tablet TAKE 1 TABLET BY MOUTH TWICE A DAY   metroNIDAZOLE (METROGEL) 1 % gel Apply 1 application topically daily as needed (rosacea).   mycophenolate (CELLCEPT) 250 MG capsule Take 250 mg by mouth 2 (two) times daily.   NON FORMULARY Pt uses a cpap nightly   OneTouch Delica Lancets 85I MISC USE TO CHECK SUGAR DAILY   ONETOUCH ULTRA test strip USE TO CHECK SUGAR DAILY   pravastatin (PRAVACHOL) 40 MG tablet Take 40 mg by mouth every evening.   sertraline (ZOLOFT) 50 MG tablet Take 1 tablet (50 mg total) by mouth daily.   tacrolimus (PROGRAF) 1 MG capsule Take 2 mg by mouth 2 (two) times daily.   No facility-administered encounter medications on file as of 02/03/2022.     Recent Relevant Labs: Lab Results  Component Value Date/Time   HGBA1C 7.2 (H) 01/01/2022 10:28 AM   HGBA1C 7.6 (H) 08/06/2021 08:52 AM   HGBA1C 5.1 01/04/2018 12:00 AM   MICROALBUR 2.1 (H) 11/18/2020 09:58 AM   MICROALBUR 3.7 (H) 11/03/2019 09:58 AM    Kidney Function Lab Results  Component Value Date/Time   CREATININE 1.18 08/06/2021 08:52 AM   CREATININE 1.00 05/08/2021 09:36 AM   CREATININE 0.94 05/04/2017 12:00 AM   GFR 77.38 11/18/2020 09:58 AM   GFRNONAA >60 04/07/2021 01:24 AM   GFRAA >60 05/12/2016 11:17 AM  Contacted patient on 02/03/22 to discuss diabetes disease state.   Current antihyperglycemic regimen:   Jardiance '10mg'$  -take 1 tablet before breakfast  (manufacturer)  Metformin xr '500mg'$ - take 2 tablets twice daily     Patient verbally confirms he is taking the above medications as directed. Yes  What diet changes have been made to improve diabetes control? Patient reports working on adding fresh frozen vegetables to meals   What recent interventions/DTPs have been made to improve glycemic control:  Patient received Jardiance from manufacturer and began to take. Patient reports he got denial letter for renewal of Jardiance  2024 stating missing information like MD signature, RX, etc. Discussed with the patient that we would fill out new renewal forms. He agreed to return to office for signature when ready.  Have there been any recent hospitalizations or ED visits since last visit with CPP? No  Patient denies hypoglycemic symptoms, including Pale, Sweaty, Shaky, Hungry, Nervous/irritable, and Vision changes  Patient denies hyperglycemic symptoms, including excessive thirst, fatigue, and polyuria  How often are you checking your blood sugar?  Infrequently, discussed traking more often and patient agreed  What are your blood sugars ranging?  Fasting: none available  After meals: 02/03/22     2 hours after lunch   183   During the week, how often does your blood glucose drop below 70? Never  Are you checking your feet daily/regularly? Yes  Adherence Review: Is the patient currently on a STATIN medication? Yes Is the patient currently on ACE/ARB medication? No Does the patient have >5 day gap between last estimated fill dates? No  Care Gaps: Annual wellness visit in last year? Yes Most Recent BP reading: 134/82  01/01/22  If Diabetic: Most recent A1C reading:7.2 Last eye exam / retinopathy screening:2021 Last diabetic foot exam:UTD  Summary of recommendations from last CCM Pharmacy visit (Date:10/06/21) Summary: CCM Initial visit -Reviewed medications; pt affirms compliance (except Jardiance - stopped due to high cost) -Reviewed Jardiance PAP - pt will qualify. Completed forms and faxed to [facility] cares -Pt reports he was previously on testosterone (topical gel packets) but stopped due to high cost, he is interested in resuming supplementation and states he was referred back to PCP for this   Recommendations/Changes made from today's visit: -Pursue Jardiance PAP -Advised pt to discuss testosterone supplementation with PCP; he will likely need new testosterone labwork (last done 10/2020)  Star Rating Drugs:   Medication:  Last Fill: Day Supply Jardiance '10mg'$   Manufacturer   Metformin '500mg'$  12/16/21 90 Pravastatin '40mg'$  12/16/21 90   No appointments scheduled within the next 30 days.   Charlene Brooke, CPP notified  Avel Sensor, [place]  [phone]

## 2022-02-03 NOTE — Telephone Encounter (Signed)
Faxed prescription page to [facility] with legible date.

## 2022-02-04 ENCOUNTER — Telehealth: Payer: Medicare Other

## 2022-02-04 NOTE — Telephone Encounter (Signed)
Patient notified.  Charlene Brooke, CPP notified  Avel Sensor, [place]  [phone]

## 2022-02-06 NOTE — Telephone Encounter (Signed)
Received fax from [facility] - they denied his enrollment stating "patient has private drug coverage" but patient does not have private drug coverage he has Medicare. On the Fax dated 12/12 we sent forms stating he does not have private coverage.  Will follow up with [employer] to discuss.

## 2022-02-09 NOTE — Telephone Encounter (Signed)
Please alert patient he has been approved. He may received more letters in the mail saying he was denied but anything dated before 02/09/22 he can disregard.

## 2022-02-09 NOTE — Telephone Encounter (Signed)
Contacted [employer] and Samantha  representative corrected the insurance on assistance form. Patient approved for year 2024 for Jardiance.  Charlene Brooke, CPP notified  Avel Sensor, [place]  [phone]

## 2022-02-09 NOTE — Telephone Encounter (Signed)
Patient contacted and appreciates all the help.    [name], [name]  [phone]

## 2022-02-17 NOTE — Unmapped (Signed)
[facility] Refill Coordination Note    Specialty Medication(s) to be Shipped:   Transplant: mycophenolate mofetil 250mg  and tacrolimus 1mg     Other medication(s) to be shipped: No additional medications requested for fill at this time     Anthony Alvarado, DOB: [birth year]  Phone: [phone] (home)       All above HIPAA information was verified with patient.     Was a Nurse, learning disability used for this call? No    Completed refill call assessment today to schedule patient's medication shipment from the [facility] [phone]).  All relevant notes have been reviewed.     Specialty medication(s) and dose(s) confirmed: Regimen is correct and unchanged.   Changes to medications: Anthony Alvarado reports no changes at this time.  Changes to insurance: No  New side effects reported not previously addressed with a pharmacist or physician: None reported  Questions for the pharmacist: No    Confirmed patient received a Conservation officer, historic buildings and a Surveyor, mining with first shipment. The patient will receive a drug information handout for each medication shipped and additional FDA Medication Guides as required.       DISEASE/MEDICATION-SPECIFIC INFORMATION        N/A    SPECIALTY MEDICATION ADHERENCE     Medication Adherence    Patient reported X missed doses in the last month: 0  Specialty Medication: tacrolimus 1 MG capsule (PROGRAF)  Patient is on additional specialty medications: Yes  Additional Specialty Medications: mycophenolate 250 mg capsule (CELLCEPT)  Patient Reported Additional Medication X Missed Doses in the Last Month: 0  Patient is on more than two specialty medications: No                          Were doses missed due to medication being on hold? No    Mycophenolate 250mg   : 7 days of medicine on hand   Tacrolimus 1mg   : 6 days of medicine on hand       REFERRAL TO PHARMACIST     Referral to the pharmacist: Not needed      [facility]     Shipping address confirmed in Epic. Delivery Scheduled: Yes, Expected medication delivery date: 02/20/2022.     Medication will be delivered via UPS to the prescription address in Epic WAM.    [name], PharmD   [facility] Specialty Pharmacist

## 2022-02-19 MED FILL — MYCOPHENOLATE MOFETIL 250 MG CAPSULE: ORAL | 30 days supply | Qty: 60 | Fill #7

## 2022-02-19 MED FILL — TACROLIMUS 1 MG CAPSULE, IMMEDIATE-RELEASE: ORAL | 30 days supply | Qty: 120 | Fill #8

## 2022-03-12 DIAGNOSIS — Z5181 Encounter for therapeutic drug level monitoring: Principal | ICD-10-CM

## 2022-03-12 DIAGNOSIS — E612 Magnesium deficiency: Principal | ICD-10-CM

## 2022-03-12 DIAGNOSIS — Z944 Liver transplant status: Principal | ICD-10-CM

## 2022-03-12 NOTE — Unmapped (Signed)
[facility] Refill Coordination Note    Specialty Medication(s) to be Shipped:   Transplant: mycophenolate mofetil 250mg  and tacrolimus 1mg     Other medication(s) to be shipped:  metformin , pravastatin and sertraline      Anthony Alvarado, DOB: 1960-05-10  Phone: [phone] (home)       All above HIPAA information was verified with patient.     Was a Nurse, learning disability used for this call? No    Completed refill call assessment today to schedule patient's medication shipment from the [facility] [phone]).  All relevant notes have been reviewed.     Specialty medication(s) and dose(s) confirmed: Regimen is correct and unchanged.   Changes to medications: [name] reports no changes at this time.  Changes to insurance: No  New side effects reported not previously addressed with a pharmacist or physician: None reported  Questions for the pharmacist: No    Confirmed patient received a Conservation officer, historic buildings and a Surveyor, mining with first shipment. The patient will receive a drug information handout for each medication shipped and additional FDA Medication Guides as required.       DISEASE/MEDICATION-SPECIFIC INFORMATION        N/A    SPECIALTY MEDICATION ADHERENCE     Medication Adherence    Patient reported X missed doses in the last month: 0  Specialty Medication: mycophenolate 250 mg capsule (CELLCEPT)  Patient is on additional specialty medications: Yes  Additional Specialty Medications: tacrolimus 1 MG capsule (PROGRAF)  Patient Reported Additional Medication X Missed Doses in the Last Month: 0  Patient is on more than two specialty medications: No                                Were doses missed due to medication being on hold? No    mycophenolate 250 mg: 12 days of medicine on hand   tacrolimus 1 mg: 12 days of medicine on hand       REFERRAL TO PHARMACIST     Referral to the pharmacist: Not needed      [facility]     Shipping address confirmed in Epic.     Delivery Scheduled: Yes, Expected medication delivery date: 03/19/22.     Medication will be delivered via UPS to the prescription address in Epic WAM.    [name]   [facility] Specialty Technician

## 2022-03-12 NOTE — Unmapped (Signed)
Called pt to schedule routine surveillance abd mri with sedation. Pt scheduled for 2/5 at [facility] at 8:45 am. Discussed npo and pt will wait to eat until after MRI. He denied need for appt letter and verbalized understanding of all discussed.     Sent mychart message to pt that he needs to stop drinking liquids 2 hours before as well.

## 2022-03-13 NOTE — Unmapped (Signed)
Patient scheduled for MRCP early next month. Spoke to him and reminded him to have a driver when he comes for the study. Also, instructed him to draw labs at that time. He verbalized understanding.

## 2022-03-16 DIAGNOSIS — Z944 Liver transplant status: Principal | ICD-10-CM

## 2022-03-16 DIAGNOSIS — E612 Magnesium deficiency: Principal | ICD-10-CM

## 2022-03-16 DIAGNOSIS — Z5181 Encounter for therapeutic drug level monitoring: Principal | ICD-10-CM

## 2022-03-18 MED FILL — TACROLIMUS 1 MG CAPSULE, IMMEDIATE-RELEASE: ORAL | 30 days supply | Qty: 120 | Fill #9

## 2022-03-18 MED FILL — MYCOPHENOLATE MOFETIL 250 MG CAPSULE: ORAL | 30 days supply | Qty: 60 | Fill #8

## 2022-03-18 MED FILL — PRAVASTATIN 40 MG TABLET: ORAL | 90 days supply | Qty: 90 | Fill #3

## 2022-03-18 MED FILL — SERTRALINE 50 MG TABLET: ORAL | 90 days supply | Qty: 90 | Fill #2

## 2022-03-18 MED FILL — METFORMIN ER 500 MG TABLET,EXTENDED RELEASE 24 HR: ORAL | 90 days supply | Qty: 360 | Fill #2

## 2022-03-23 DIAGNOSIS — E612 Magnesium deficiency: Principal | ICD-10-CM

## 2022-03-23 DIAGNOSIS — Z944 Liver transplant status: Principal | ICD-10-CM

## 2022-03-23 DIAGNOSIS — Z5181 Encounter for therapeutic drug level monitoring: Principal | ICD-10-CM

## 2022-03-24 DIAGNOSIS — E119 Type 2 diabetes mellitus without complications: Secondary | ICD-10-CM | POA: Diagnosis not present

## 2022-03-24 LAB — HM DIABETES EYE EXAM

## 2022-03-30 ENCOUNTER — Ambulatory Visit: Admit: 2022-03-30 | Discharge: 2022-03-30 | Payer: MEDICARE

## 2022-03-30 DIAGNOSIS — Z944 Liver transplant status: Principal | ICD-10-CM

## 2022-03-30 DIAGNOSIS — E612 Magnesium deficiency: Principal | ICD-10-CM

## 2022-03-30 DIAGNOSIS — Z5181 Encounter for therapeutic drug level monitoring: Principal | ICD-10-CM

## 2022-03-30 DIAGNOSIS — C220 Liver cell carcinoma: Secondary | ICD-10-CM | POA: Diagnosis not present

## 2022-03-30 DIAGNOSIS — R935 Abnormal findings on diagnostic imaging of other abdominal regions, including retroperitoneum: Secondary | ICD-10-CM | POA: Diagnosis not present

## 2022-03-30 MED ADMIN — gadobenate dimeglumine (MULTIHANCE) 529 mg/mL (0.1mmol/0.2mL) solution 10 mL: 10 mL | INTRAVENOUS | @ 14:00:00 | Stop: 2022-03-30

## 2022-03-30 MED ADMIN — LORazepam (ATIVAN) injection 1 mg: 1 mg | INTRAVENOUS | @ 14:00:00 | Stop: 2022-03-30

## 2022-03-31 DIAGNOSIS — Z5181 Encounter for therapeutic drug level monitoring: Principal | ICD-10-CM

## 2022-03-31 DIAGNOSIS — E612 Magnesium deficiency: Principal | ICD-10-CM

## 2022-03-31 DIAGNOSIS — Z944 Liver transplant status: Principal | ICD-10-CM

## 2022-03-31 DIAGNOSIS — C220 Liver cell carcinoma: Principal | ICD-10-CM

## 2022-04-01 LAB — COMPREHENSIVE METABOLIC PANEL
A/G RATIO: 1.6 (ref 1.2–2.2)
ALBUMIN: 4.6 g/dL (ref 3.8–4.9)
ALKALINE PHOSPHATASE: 98 IU/L (ref 44–121)
ALT (SGPT): 52 IU/L — ABNORMAL HIGH (ref 0–44)
AST (SGOT): 31 IU/L (ref 0–40)
BILIRUBIN TOTAL (MG/DL) IN SER/PLAS: 0.4 mg/dL (ref 0.0–1.2)
BLOOD UREA NITROGEN: 21 mg/dL (ref 6–24)
BUN / CREAT RATIO: 18 (ref 9–20)
CALCIUM: 9.2 mg/dL (ref 8.7–10.2)
CHLORIDE: 101 mmol/L (ref 96–106)
CO2: 19 mmol/L — ABNORMAL LOW (ref 20–29)
CREATININE: 1.15 mg/dL (ref 0.76–1.27)
GLOBULIN, TOTAL: 2.8 g/dL (ref 1.5–4.5)
GLUCOSE: 142 mg/dL — ABNORMAL HIGH (ref 70–99)
POTASSIUM: 4.5 mmol/L (ref 3.5–5.2)
SODIUM: 138 mmol/L (ref 134–144)
TOTAL PROTEIN: 7.4 g/dL (ref 6.0–8.5)

## 2022-04-01 LAB — AFP TUMOR MARKER: AFP-TUMOR MARKER: <1.8 ng/mL (ref 0.0–8.4)

## 2022-04-01 LAB — CBC W/ DIFFERENTIAL
BANDED NEUTROPHILS ABSOLUTE COUNT: 0.1 10*3/uL (ref 0.0–0.1)
BASOPHILS ABSOLUTE COUNT: 0.1 10*3/uL (ref 0.0–0.2)
BASOPHILS RELATIVE PERCENT: 1 %
EOSINOPHILS ABSOLUTE COUNT: 0.4 10*3/uL (ref 0.0–0.4)
EOSINOPHILS RELATIVE PERCENT: 4 %
HEMATOCRIT: 44.1 % (ref 37.5–51.0)
HEMOGLOBIN: 14.8 g/dL (ref 13.0–17.7)
IMMATURE GRANULOCYTES: 1 %
LYMPHOCYTES ABSOLUTE COUNT: 1.6 10*3/uL (ref 0.7–3.1)
LYMPHOCYTES RELATIVE PERCENT: 15 %
MEAN CORPUSCULAR HEMOGLOBIN CONC: 33.6 g/dL (ref 31.5–35.7)
MEAN CORPUSCULAR HEMOGLOBIN: 30.4 pg (ref 26.6–33.0)
MEAN CORPUSCULAR VOLUME: 91 fL (ref 79–97)
MONOCYTES ABSOLUTE COUNT: 1.2 10*3/uL — ABNORMAL HIGH (ref 0.1–0.9)
MONOCYTES RELATIVE PERCENT: 12 %
NEUTROPHILS ABSOLUTE COUNT: 6.9 10*3/uL (ref 1.4–7.0)
NEUTROPHILS RELATIVE PERCENT: 67 %
PLATELET COUNT: 231 10*3/uL (ref 150–450)
RED BLOOD CELL COUNT: 4.87 x10E6/uL (ref 4.14–5.80)
RED CELL DISTRIBUTION WIDTH: 13.1 % (ref 11.6–15.4)
WHITE BLOOD CELL COUNT: 10.2 10*3/uL (ref 3.4–10.8)

## 2022-04-01 LAB — PHOSPHORUS: PHOSPHORUS, SERUM: 2.7 mg/dL — ABNORMAL LOW (ref 2.8–4.1)

## 2022-04-01 LAB — BILIRUBIN, DIRECT: BILIRUBIN DIRECT: 0.15 mg/dL (ref 0.00–0.40)

## 2022-04-01 LAB — MAGNESIUM: MAGNESIUM: 1.6 mg/dL (ref 1.6–2.3)

## 2022-04-01 LAB — GAMMA GT: GAMMA GLUTAMYL TRANSFERASE: 53 IU/L (ref 0–65)

## 2022-04-02 LAB — TACROLIMUS LEVEL: TACROLIMUS BLOOD: 5.3 ng/mL (ref 2.0–20.0)

## 2022-04-06 ENCOUNTER — Ambulatory Visit: Admit: 2022-04-06 | Discharge: 2022-04-07 | Payer: MEDICARE

## 2022-04-06 DIAGNOSIS — E612 Magnesium deficiency: Principal | ICD-10-CM

## 2022-04-06 DIAGNOSIS — E669 Obesity, unspecified: Principal | ICD-10-CM

## 2022-04-06 DIAGNOSIS — Z944 Liver transplant status: Principal | ICD-10-CM

## 2022-04-06 DIAGNOSIS — Z5181 Encounter for therapeutic drug level monitoring: Principal | ICD-10-CM

## 2022-04-06 DIAGNOSIS — E119 Type 2 diabetes mellitus without complications: Principal | ICD-10-CM

## 2022-04-06 LAB — HEMOGLOBIN A1C
ESTIMATED AVERAGE GLUCOSE: 166 mg/dL
HEMOGLOBIN A1C: 7.4 % — ABNORMAL HIGH (ref 4.8–5.6)

## 2022-04-06 MED ORDER — OZEMPIC 0.25 MG OR 0.5 MG (2 MG/3 ML) SUBCUTANEOUS PEN INJECTOR
11 refills | 0 days | Status: CP
Start: 2022-04-06 — End: ?
  Filled 2022-06-04: qty 15, 112d supply, fill #0

## 2022-04-06 NOTE — Unmapped (Signed)
1) We will start Ozempic 0.25 mg once a week injection. After 1 month, if doing well, then we can increase to the next dose.    2) Continue your metformin and jardiance for now.    3) Try to incorporate 1 mile of walking at least 5 days a week. Focus on the mornings while you have energy.    4) Try to replace a fast food meal with a home meal at least once a week.

## 2022-04-06 NOTE — Unmapped (Signed)
Pt did not bring any devices for clinic download. POC Glucose  done today. PP: 1030. 172mg /dL.

## 2022-04-06 NOTE — Unmapped (Addendum)
Dr. Sherryll Burger reviewed patient's 2/5 MRI to monitor area of hyperenhancement in segment 5/6,noted on CT last year. He denied need for further MRI monitoring, but will continue monitoring AFP q 6 mos until July '25. Spoke to patient and relayed findings/recommendations. He verbalized understanding.

## 2022-04-06 NOTE — Unmapped (Signed)
ASSESSMENT and PLAN:   32 M s/p liver transplant 2022, recent MRI liver with some steatosis in transplant, obesity, T2DM  Referred by transplant team to discuss strategies to optimize metabolic health.  A1C in 01/01/22 7.2%   Weight loss coupled with improvement in poor diet (frequent fast food and snacking) and increased exercise will be most beneficial. We discussed options for weight loss and GLP1Ra may be good option. There is some evidence that may be beneficial for hepatic steatosis (although very little data in transplant patients). Eventually, this may allow him to stop jardiance. Would always keep MTF on board.    1. Liver replaced by transplant (CMS-HCC)  2. Obesity, unspecified classification, unspecified obesity type, unspecified whether serious comorbidity present  3. Type 2 diabetes mellitus without complication, without long-term current use of insulin (CMS-HCC)  - Ambulatory referral to Endocrinology  - Hemoglobin A1c  - start ozempic 0.25 and titrate up  - continue MTF 1000 BID  - continue jardiance 10 mg daily  - CPP appt in 4-6 weeks for med titration  - set achievable targets for diet (replace one fast food meal a week) and try to walk 5 days a week  - has appt with CDE for nutrition/diabetic diet help  - if GLP1 not feasible, could also consider pioglitazone although that would not help with weight    Return for In-person.4-5 months  60 minutes spent in patient care    SUBJECTIVE:   Anthony Alvarado is a 62 y.o. male who presents for initial evaluation of post-liver transplant related type 2 diabetes.  PMH: obesity, s/p AVR 2023  Transplant doing well on cellcept and prograf    DM history:  2018: had pre-DM before liver transplant and controlled with diet, transiently on insulin but able to stop  2020: Liver transplant and since transplant has been on MTF and jardiance    Weight: since transplant has gained about 25 pounds; minimal exercise due to habit  Diet: cereal for breakfast, fast food at least once a day because too tired to cook    PCP Dr. 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 Take with a full glass of water on an empty stomach. (Patient taking differently: Take 1 tablet (70 mg total) by mouth Every Sunday. Pt reports that he has missed a few doses.) 13 tablet 3    aspirin 325 MG tablet Take 1 tablet (325 mg total) by mouth daily.      azelastine (ASTELIN) 137 mcg (0.1 %) nasal spray 1 spray into each nostril daily as needed.      blood sugar  diagnostic (ONETOUCH ULTRA TEST) Strp USE TO CHECK SUGAR DAILY      blood sugar diagnostic Strp Use as directed Three (3) times a day before meals. 90 each 11    calcium carbonate (TUMS) 200 mg calcium (500 mg) chewable tablet Chew 1 tablet (200 mg of elem calcium total) daily. Pt reports taking every other day.      FEROCON 110-0.5 mg capsule TAKE 1 CAPSULE BY MOUTH 2 (TWO) TIMES DAILY AFTER A MEAL. NOT COVERED BY INSURANCE.      fluticasone propionate (FLONASE) 50 mcg/actuation nasal spray PLACE ONE OR TWO SPRAYS INTO BOTH NOSTRILS DAILY AS NEEDED.      JARDIANCE 10 mg tablet Pt reports that he has not taken this medication in about 1 month due to the price.      lancets 33 gauge Misc 1 each by Miscellaneous route Three (3) times a day before meals. 100 each 11    metFORMIN (GLUCOPHAGE-XR) 500 MG 24 hr tablet Take 2 tablets (1,000 mg total) by mouth every morning AND 2 tablets (1,000 mg total) at bedtime. 360 tablet 3    metoprolol tartrate (LOPRESSOR) 25 MG tablet Take 1 tablet (25 mg total) by mouth in the morning.      mycophenolate (CELLCEPT) 250 mg capsule Take 1 capsule (250 mg total) by mouth Two (2) times a day. 60 capsule 11    pravastatin (PRAVACHOL) 40 MG tablet Take 1 tablet (40 mg total) by mouth every evening. 90 tablet 3    sertraline (ZOLOFT) 50 MG tablet Take 1 tablet (50 mg total) by mouth daily. 90 tablet 3    tacrolimus (PROGRAF) 1 MG capsule Take 2 capsules (2 mg total) by mouth two (2) times a day. 120 capsule 11    testosterone (ANDROGEL) 1 % (25 mg/2.5gram) GlPk APPLY 2.5G (1 PACKET) TO EACH SHOULDER/UPPER ARM DAILY FOR A TOTAL DAILY DOSE OF 5G 150 packet 0    levothyroxine (SYNTHROID) 150 MCG tablet Take 1 tablet (150 mcg total) by mouth daily. 30 tablet 11    semaglutide (OZEMPIC) 0.25 mg or 0.5 mg (2 mg/3 mL) PnIj Start with 0.25 mg weekly for 4 weeks. If feeling well, then increase to 0.5 mg weekly. 3 mL 11     No current facility-administered medications for this visit.     Facility-Administered Medications Ordered in Other Visits   Medication Dose Route Frequency Provider Last Rate Last Admin    diazePAM (VALIUM) 5 mg/mL injection                Allergies   Allergen Reactions    Watermelon Flavor      Mouth itching       Social History  Social History     Socioeconomic History    Marital status: Legally Separated   Tobacco Use    Smoking status: Never    Smokeless tobacco: Never   Vaping Use    Vaping status: Never Used   Substance and Sexual Activity    Alcohol use: Not Currently    Drug use: Never   Other Topics Concern    Do you use sunscreen? Yes    Tanning bed use? No    Are you easily burned? Yes    Excessive sun exposure? No    Blistering sunburns? Yes     Social Determinants of Health     Financial Resource Strain: Low Risk  (12/31/2021)    Received from [facility]    Overall Financial Resource Strain (  CARDIA)     Difficulty of Paying Living Expenses: Not very hard   Food Insecurity: No Food Insecurity (12/31/2021)    Received from [facility]    Hunger Vital Sign     Worried About Running Out of Food in the Last Year: Never true     Ran Out of Food in the Last Year: Never true   Transportation Needs: No Transportation Needs (12/31/2021)    Received from [facility] - Transportation     Lack of Transportation (Medical): No     Lack of Transportation (Non-Medical): No   Physical Activity: Inactive (12/31/2021)    Received from [facility]    Exercise Vital Sign     Days of Exercise per Week: 0 days     Minutes of Exercise per Session: 0 min   Stress: No Stress Concern Present (12/31/2021)    Received from [facility] - Occupational Stress Questionnaire     Feeling of Stress : Not at all   Social Connections: Moderately Isolated (12/31/2021)    Received from [facility]    Social Connection and Isolation Panel [NHANES]     Frequency of Communication with Friends and Family: More than three times a week     Frequency of Social Gatherings with Friends and Family: Once a week     Attends Religious Services: More than 4 times per year     Active Member of [employer] or Organizations: No     Attends Engineer, structural: Never     Marital Status: Separated        Family History  No DM in family    Social Hx:  On disability  Smoke: none  EtOH: none       Review of Systems  A 10 systems reviewed and were negative except for pertinent items noted in the HPI and below:    Eyes: denies blurred vision; last dilated eye exam showed no diabetic retinopathy  CVS: denies CP   PULM:  denies SOB     OBJECTVE:     Vitals:    04/06/22 1312   BP: 141/90   Pulse: 79      Gen:  In no apparent distress, appears consistent with stated age   Eyes:  Anicteric, EOMI  EENT: OP clear, good dentition  Neck: supple with no palpable thyroid abnormalities  Lymph: no palpable cervical or supraclavicular lymphadenopathy  CVS: RRR, no r/m/g; DP/PT pulses 1+ equal and symmetric  Pulm: CTAB, equal and symmetric respiratory effort  Abd: soft, non-tender, non-distended   MUSK: normal station and gait  Psych:  Alert and oriented x 4; mood is not down, depressed or anxious    Lab Review    DIABETES MELLITUS RESULTS:    Hemoglobin A1C (%)   Date Value   09/18/2019 6.6 (H)   04/17/2019 6.0 (H)   12/19/2018 5.9 (H)     Glucose, POC (mg/dL)   Date Value   16/11/9602 172   10/02/2020 136   01/13/2019 90         Lab Results   Component Value Date    CREATININE 1.15 03/31/2022     Lab Results   Component Value Date    CHOL 122 04/17/2019     Lab Results   Component Value Date    HDL 33 (L) 04/17/2019     No components found for: LDLCALC, DIRECTLDL  Lab Results  Component Value Date    TRIG 185 (H) 04/17/2019     Lab Results   Component Value Date    TSH 1.440 06/19/2019     Creatinine (mg/dL)   Date Value   60/63/0160 1.15   12/25/2021 1.24   08/28/2021 1.18 (H)   07/24/2021 1.15

## 2022-04-13 ENCOUNTER — Telehealth: Payer: Self-pay | Admitting: Family Medicine

## 2022-04-13 DIAGNOSIS — Z944 Liver transplant status: Principal | ICD-10-CM

## 2022-04-13 DIAGNOSIS — E612 Magnesium deficiency: Principal | ICD-10-CM

## 2022-04-13 DIAGNOSIS — Z5181 Encounter for therapeutic drug level monitoring: Principal | ICD-10-CM

## 2022-04-13 DIAGNOSIS — G4733 Obstructive sleep apnea (adult) (pediatric): Secondary | ICD-10-CM | POA: Diagnosis not present

## 2022-04-13 NOTE — Telephone Encounter (Signed)
Pt called in stated he would need a new prescription for Jardiance  stated there was issues with the initials and date . Please advise [phone]

## 2022-04-13 NOTE — Unmapped (Signed)
[facility] Refill Coordination Note    Specialty Medication(s) to be Shipped:   Transplant: mycophenolate mofetil 250mg  and tacrolimus 1mg     Other medication(s) to be shipped: No additional medications requested for fill at this time     Anthony Alvarado, DOB: 1960/09/15  Phone: [phone] (home)       All above HIPAA information was verified with patient.     Was a Nurse, learning disability used for this call? No    Completed refill call assessment today to schedule patient's medication shipment from the [facility] [id number]).  All relevant notes have been reviewed.     Specialty medication(s) and dose(s) confirmed: Regimen is correct and unchanged.   Changes to medications: Anthony Alvarado reports no changes at this time.  Changes to insurance: No  New side effects reported not previously addressed with a pharmacist or physician: None reported  Questions for the pharmacist: No    Confirmed patient received a Conservation officer, historic buildings and a Surveyor, mining with first shipment. The patient will receive a drug information handout for each medication shipped and additional FDA Medication Guides as required.       DISEASE/MEDICATION-SPECIFIC INFORMATION        N/A    SPECIALTY MEDICATION ADHERENCE     Medication Adherence    Patient reported X missed doses in the last month: 0  Specialty Medication: tacrolimus 1 MG capsule (PROGRAF)  Patient is on additional specialty medications: Yes  Additional Specialty Medications: mycophenolate 250 mg capsule (CELLCEPT)  Patient Reported Additional Medication X Missed Doses in the Last Month: 0  Patient is on more than two specialty medications: No              Were doses missed due to medication being on hold? No    mycophenolate 250 mg: 6 days of medicine on hand   tacrolimus 1 mg: 6 days of medicine on hand       REFERRAL TO PHARMACIST     Referral to the pharmacist: Not needed      [facility]     Shipping address confirmed in Epic.     Delivery Scheduled: Yes, Expected medication delivery date: 04/15/22.     Medication will be delivered via UPS to the prescription address in Epic WAM.    [place] A Audriella Blakeley   [facility] Specialty Technician

## 2022-04-13 NOTE — Unmapped (Unsigned)
Time in/out:  Total time:  Anthony Nielsen, MD    Assessment     Anthony Alvarado is a 62 y.o. male who presents for f/u of Uncontrolled Diabetes, Type 2  Diagnosed with diabetes ***.  Last visit on *** with ***.  Anthony Alvarado is currently utilizing Current Doses/Devices***  to manage their diabetes care.      s/p liver transplant 2022, recent MRI liver with some steatosis in transplant, obesity,     Current Medications:    Ozempic 0.25mg   Metformin 1000 BID  Jardiance 10mg  qd    Lab Results   Component Value Date    A1C 7.4 (H) 04/06/2022           {Blank single:19197::Glucometer,Libre 2,Libre 3,Dexcom G6,Dexcom G7}   report downloaded and reviewed. ***       Plan     Educational Interventions:      Plan:      Next follow up:      Subjective:       Desired Leaning Objective  --       Current Dosing      Dexcom/Pump Data         Objective:        There were no vitals taken for this visit.        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 Take with a full glass of water on an empty stomach. (Patient taking differently: Take 1 tablet (70 mg total) by mouth Every Sunday. Pt reports that he has missed a few doses.), Disp: 13 tablet, Rfl: 3    aspirin 325 MG tablet, Take 1 tablet (325 mg total) by mouth daily., Disp: , Rfl:     azelastine (ASTELIN) 137 mcg (0.1 %) nasal spray, 1 spray into each nostril daily as needed., Disp: , Rfl:     blood sugar diagnostic (ONETOUCH ULTRA TEST) Strp, USE TO CHECK SUGAR DAILY, Disp: , Rfl:     blood sugar diagnostic Strp, Use as directed Three (3) times a day before meals., Disp: 90 each, Rfl: 11    calcium carbonate (TUMS) 200 mg calcium (500 mg) chewable tablet, Chew 1 tablet (200 mg of elem calcium total) daily. Pt reports taking every other day., Disp: , Rfl:     FEROCON 110-0.5 mg capsule, TAKE 1 CAPSULE BY MOUTH 2 (TWO) TIMES DAILY AFTER A MEAL. NOT COVERED BY INSURANCE., Disp: , Rfl:     fluticasone propionate (FLONASE) 50 mcg/actuation nasal spray, PLACE ONE OR TWO SPRAYS INTO BOTH NOSTRILS DAILY AS NEEDED., Disp: , Rfl:     JARDIANCE 10 mg tablet, Pt reports that he has not taken this medication in about 1 month due to the price., Disp: , Rfl:     lancets 33 gauge Misc, 1 each by Miscellaneous route Three (3) times a day before meals., Disp: 100 each, Rfl: 11    levothyroxine (  SYNTHROID) 150 MCG tablet, Take 1 tablet (150 mcg total) by mouth daily., Disp: 30 tablet, Rfl: 11    metFORMIN (GLUCOPHAGE-XR) 500 MG 24 hr tablet, Take 2 tablets (1,000 mg total) by mouth every morning AND 2 tablets (1,000 mg total) at bedtime., Disp: 360 tablet, Rfl: 3    metoprolol tartrate (LOPRESSOR) 25 MG tablet, Take 1 tablet (25 mg total) by mouth in the morning., Disp: , Rfl:     mycophenolate (CELLCEPT) 250 mg capsule, Take 1 capsule (250 mg total) by mouth Two (2) times a day., Disp: 60 capsule, Rfl: 11    pravastatin (PRAVACHOL) 40 MG tablet, Take 1 tablet (40 mg total) by mouth every evening., Disp: 90 tablet, Rfl: 3    semaglutide (OZEMPIC) 0.25 mg or 0.5 mg (2 mg/3 mL) PnIj, Start with 0.25 mg weekly for 4 weeks. If feeling well, then increase to 0.5 mg weekly., Disp: 3 mL, Rfl: 11    sertraline (ZOLOFT) 50 MG tablet, Take 1 tablet (50 mg total) by mouth daily., Disp: 90 tablet, Rfl: 3    tacrolimus (PROGRAF) 1 MG capsule, Take 2 capsules (2 mg total) by mouth two (2) times a day., Disp: 120 capsule, Rfl: 11    testosterone (ANDROGEL) 1 % (25 mg/2.5gram) GlPk, APPLY 2.5G (1 PACKET) TO EACH SHOULDER/UPPER ARM DAILY FOR A TOTAL DAILY DOSE OF 5G, Disp: 150 packet, Rfl: 0  No current facility-administered medications for this visit.    Facility-Administered Medications Ordered in Other Visits:     diazePAM (VALIUM) 5 mg/mL injection, , , ,     Allergies   Allergen Reactions    Watermelon Flavor      Mouth itching       Family History   Problem Relation Age of Onset    Asthma Mother     Hearing loss Mother     COPD Father     Cancer Father     Autoimmune disease Sister         AIH    Heart disease Sister     Colon cancer Maternal Grandfather     Liver disease Sister     Thyroid disease Sister     Thyroid disease Sister     Melanoma Neg Hx     Basal cell carcinoma Neg Hx     Squamous cell carcinoma Neg Hx          Lab Results   Component Value Date    A1C 7.4 (H) 04/06/2022    A1C 6.6 (H) 09/18/2019    A1C 6.0 (H) 04/17/2019    A1C 5.9 (H) 12/19/2018       Wt Readings from Last 3 Encounters:   04/06/22 93.9 kg (207 lb)   08/28/21 92.4 kg (203 lb 9.6 oz)   09/17/20 95.8 kg (211 lb 3.2 oz)       Lab Review    Lab Results   Component Value Date    NA 138 03/31/2022    K 4.5 03/31/2022    CL 101 03/31/2022    CO2 19 (L) 03/31/2022    BUN 21 03/31/2022    CREATININE 1.15 03/31/2022    GFRNONAA 79 01/25/2020    GFRAA 92 01/25/2020    CALCIUM 9.2 03/31/2022    ALBUMIN 4.2 08/28/2021    PHOS 3.3 08/28/2021       Lab Results   Component Value Date    CHOL 122 04/17/2019     Lab Results   Component Value Date  LDL 52 (L) 04/17/2019     Lab Results   Component Value Date    HDL 33 (L) 04/17/2019     Lab Results   Component Value Date    TRIG 185 (H) 04/17/2019     Lab Results   Component Value Date    ALT 52 (H) 03/31/2022     Lab Results   Component Value Date    TSH 1.440 06/19/2019     No results found for: CREATUR, MALBQUALUR, ACRATIO  No results found for: ALBQTUR, ALBCRERATIO, ALBCRERAT  No results found for: LABCREA, MICROALBQTUR, MSHCGMOM, MALBCRERAT  No results found for: PROTEINUR, LABCREA, PCRATIOUR      Cherene Altes RN CDCES

## 2022-04-14 DIAGNOSIS — U071 Positive self-administered antigen test for COVID-19: Principal | ICD-10-CM

## 2022-04-14 DIAGNOSIS — D849 Immunodeficiency, unspecified: Principal | ICD-10-CM

## 2022-04-14 DIAGNOSIS — Z944 Liver transplant status: Principal | ICD-10-CM

## 2022-04-14 MED ORDER — MOLNUPIRAVIR 200 MG CAPSULE (EUA)
ORAL_CAPSULE | Freq: Two times a day (BID) | ORAL | 0 refills | 5 days | Status: CP
Start: 2022-04-14 — End: 2022-04-19

## 2022-04-14 MED FILL — MYCOPHENOLATE MOFETIL 250 MG CAPSULE: ORAL | 30 days supply | Qty: 60 | Fill #9

## 2022-04-14 MED FILL — TACROLIMUS 1 MG CAPSULE, IMMEDIATE-RELEASE: ORAL | 30 days supply | Qty: 120 | Fill #10

## 2022-04-14 NOTE — Telephone Encounter (Signed)
Patient gets Jardiance through [employer]. PCP faxed Rx for Jardiance to [facility] on 03/14/22.   Jardiance 10 mg - 1 tablet by mouth daily. #90 with 3 refills.  Will call [facility] to update Rx with current date.

## 2022-04-14 NOTE — Unmapped (Signed)
Patient contacted [facility] to report that he started having cold sx on Friday with sore throat, nasal and mild chest congestion, but no fever or sob. He reports he performed 2 home tests, which were both positive. Discussed importance of increased hydration and sx mgmt and efaxed rx for molnupiravir to his local pharmacy to help decrease severity of infection. Explained that some insurances are no longer getting govt reimbursement, and it may prove to expensive. Reassured him that most infections can be managed by treating with otc sx relief meds, but if he should develop worsening sx, fever or sob, he should be seen urgently. He verbalized understanding.

## 2022-04-14 NOTE — Telephone Encounter (Cosign Needed)
Contacted [facility] and provided new RX for Jardiance 6m take 1 tablet daily #90  3 RF   VAvel Sensor CDeer ParkAssistant [phone]

## 2022-04-20 DIAGNOSIS — E612 Magnesium deficiency: Principal | ICD-10-CM

## 2022-04-20 DIAGNOSIS — Z944 Liver transplant status: Principal | ICD-10-CM

## 2022-04-20 DIAGNOSIS — Z5181 Encounter for therapeutic drug level monitoring: Principal | ICD-10-CM

## 2022-04-27 DIAGNOSIS — E612 Magnesium deficiency: Principal | ICD-10-CM

## 2022-04-27 DIAGNOSIS — Z944 Liver transplant status: Principal | ICD-10-CM

## 2022-04-27 DIAGNOSIS — Z5181 Encounter for therapeutic drug level monitoring: Principal | ICD-10-CM

## 2022-04-29 ENCOUNTER — Ambulatory Visit (INDEPENDENT_AMBULATORY_CARE_PROVIDER_SITE_OTHER): Payer: Medicare Other | Admitting: Family Medicine

## 2022-04-29 ENCOUNTER — Encounter: Payer: Self-pay | Admitting: Family Medicine

## 2022-04-29 VITALS — BP 122/76 | HR 70 | Temp 98.2°F | Ht 69.0 in | Wt 203.0 lb

## 2022-04-29 DIAGNOSIS — J4521 Mild intermittent asthma with (acute) exacerbation: Secondary | ICD-10-CM | POA: Diagnosis not present

## 2022-04-29 DIAGNOSIS — D849 Immunodeficiency, unspecified: Secondary | ICD-10-CM | POA: Diagnosis not present

## 2022-04-29 LAB — DECEASED DONOR CL I&II, LOW RES
DONOR LOW RES DRW #1: 52
DONOR LOW RES DRW #2: 53
DONOR LOW RES HLA A #1: 2
DONOR LOW RES HLA A #2: 3
DONOR LOW RES HLA B #1: 13
DONOR LOW RES HLA B #2: 60
DONOR LOW RES HLA BW #1: 4
DONOR LOW RES HLA BW #2: 6
DONOR LOW RES HLA C #1: 10
DONOR LOW RES HLA C #2: 6
DONOR LOW RES HLA DQ #1: 2
DONOR LOW RES HLA DQ #2: 8
DONOR LOW RES HLA DR #1: 17
DONOR LOW RES HLA DR #2: 4

## 2022-04-29 MED ORDER — AMOXICILLIN 500 MG PO CAPS: 1000.0000 mg | ORAL_CAPSULE | Freq: Two times a day (BID) | ORAL | 0 refills | Status: DC

## 2022-04-29 NOTE — Assessment & Plan Note (Signed)
Acute, given ongoing symptoms greater than 7 to 10 days as well as patient's immunocompromise status, we will treat for possible initial viral infection with current bacterial superinfection.  Treat with azithromycin 5-day course. He can use albuterol 2 puffs inhaled every 4-6 hours as needed for wheeze. No clear indication at this point for prednisone taper but he will contact me if his breathing does not improve with treatment with antibiotics.  Return and ER precautions provided

## 2022-04-29 NOTE — Progress Notes (Signed)
Patient ID: GILDARDO LARDNER, male    DOB: 10/15/1960, 62 y.o.   MRN: [id number]  This visit was conducted in person.  BP 122/76 (BP Location: Left Arm, Patient Position: Sitting, Cuff Size: Normal)   Pulse 70   Temp 98.2 F (36.8 C) (Temporal)   Ht '5\' 9"'$  (1.753 m)   Wt 203 lb (92.1 kg)   SpO2 97%   BMI 29.98 kg/m    CC:  Chief Complaint  Patient presents with   Cough    Congestion and sore throat since Saturday. Patient has been taking coricidin cold and flu for symptoms. Patient stated that he was positive for covid on 04/08/22.     Subjective:   HPI: [name] is a 62 y.o. male  with DM, OSA, Cadpresenting on 04/29/2022 for Cough (Congestion and sore throat since Saturday. Patient has been taking coricidin cold and flu for symptoms. Patient stated that he was positive for covid on 04/08/22. )   Date of onset 5 days  Initial symptoms included  ST, congestion Symptoms progressed to cough, productive  Wheeze,  no SOB.  Some body ache and soreness under shoulder blade. No fever.  No face pain, no ear pain.    Sick contacts none COVID testing:   2/20 lately your symptoms from COVID/2023 positive test... resolved symptoms.     She has tried to treat with  coricidin cold  Has not needed the albuterol.      Has history of allergies and asthma  On immunosuppressant for liver transplant Non-smoker.       Relevant past medical, surgical, family and social history reviewed and updated as indicated. Interim medical history since our last visit reviewed. Allergies and medications reviewed and updated. Outpatient Medications Prior to Visit  Medication Sig Dispense Refill   acetaminophen (TYLENOL) 500 MG tablet Take 500 mg by mouth every 6 (six) hours as needed for moderate pain or headache.     albuterol (VENTOLIN HFA) 108 (90 Base) MCG/ACT inhaler Inhale 2 puffs into the lungs every 6 (six) hours as needed for wheezing or shortness of breath.     alendronate (FOSAMAX)  70 MG tablet Take 1 tablet (70 mg total) by mouth every Sunday. Take with a full glass of water on an empty stomach. 13 tablet 3   aspirin EC 325 MG EC tablet Take 1 tablet (325 mg total) by mouth daily.     azelastine (ASTELIN) 0.1 % nasal spray PLACE 2 SPRAYS INTO BOTH NOSTRILS TWICE DAILY AS NEEDED 30 mL 1   calcium carbonate (TUMS - DOSED IN MG ELEMENTAL CALCIUM) 500 MG chewable tablet Chew 1 tablet by mouth daily.     Cholecalciferol (VITAMIN D) 50 MCG (2000 UT) tablet Take 2,000 Units by mouth daily.     empagliflozin (JARDIANCE) 10 MG TABS tablet Take 1 tablet (10 mg total) by mouth daily before breakfast. 30 tablet 5   fluticasone (FLONASE) 50 MCG/ACT nasal spray PLACE ONE OR TWO SPRAYS INTO BOTH NOSTRILS DAILY AS NEEDED. 48 g 3   levothyroxine (SYNTHROID) 175 MCG tablet Take 1 tablet (175 mcg total) by mouth daily before breakfast. 90 tablet 3   metFORMIN (GLUCOPHAGE-XR) 500 MG 24 hr tablet Take 2 tablets (1,000 mg total) by mouth in the morning and at bedtime. 360 tablet 3   metoprolol tartrate (LOPRESSOR) 25 MG tablet TAKE 1 TABLET BY MOUTH TWICE A DAY 180 tablet 1   metroNIDAZOLE (METROGEL) 1 % gel Apply 1 application topically  daily as needed (rosacea).     mycophenolate (CELLCEPT) 250 MG capsule Take 250 mg by mouth 2 (two) times daily.     NON FORMULARY Pt uses a cpap nightly     OneTouch Delica Lancets 99991111 MISC USE TO CHECK SUGAR DAILY 100 each 3   ONETOUCH ULTRA test strip USE TO CHECK SUGAR DAILY 100 strip 0   pravastatin (PRAVACHOL) 40 MG tablet Take 40 mg by mouth every evening.     sertraline (ZOLOFT) 50 MG tablet Take 1 tablet (50 mg total) by mouth daily. 90 tablet 3   tacrolimus (PROGRAF) 1 MG capsule Take 2 mg by mouth 2 (two) times daily.     No facility-administered medications prior to visit.     Per HPI unless specifically indicated in ROS section below Review of Systems  Constitutional:  Negative for fatigue and fever.  HENT:  Positive for congestion. Negative  for ear pain.   Eyes:  Negative for pain.  Respiratory:  Positive for cough. Negative for shortness of breath.   Cardiovascular:  Negative for chest pain, palpitations and leg swelling.  Gastrointestinal:  Negative for abdominal pain.  Genitourinary:  Negative for dysuria.  Musculoskeletal:  Negative for arthralgias.  Neurological:  Negative for syncope, light-headedness and headaches.  Psychiatric/Behavioral:  Negative for dysphoric mood.    Objective:  BP 122/76 (BP Location: Left Arm, Patient Position: Sitting, Cuff Size: Normal)   Pulse 70   Temp 98.2 F (36.8 C) (Temporal)   Ht '5\' 9"'$  (1.753 m)   Wt 203 lb (92.1 kg)   SpO2 97%   BMI 29.98 kg/m   Wt Readings from Last 3 Encounters:  04/29/22 203 lb (92.1 kg)  01/01/22 207 lb (93.9 kg)  12/31/21 207 lb (93.9 kg)      Physical Exam Constitutional:      Appearance: He is well-developed. He is obese.  HENT:     Head: Normocephalic.     Right Ear: Hearing normal.     Left Ear: Hearing normal.     Nose: Congestion present.  Neck:     Thyroid: No thyroid mass or thyromegaly.     Vascular: No carotid bruit.     Trachea: Trachea normal.  Cardiovascular:     Rate and Rhythm: Normal rate and regular rhythm.     Pulses: Normal pulses.     Heart sounds: Heart sounds not distant. No murmur heard.    No friction rub. No gallop.     Comments: No peripheral edema Pulmonary:     Effort: Pulmonary effort is normal. No respiratory distress.     Breath sounds: Normal breath sounds.  Skin:    General: Skin is warm and dry.     Findings: No rash.  Psychiatric:        Speech: Speech normal.        Behavior: Behavior normal.        Thought Content: Thought content normal.       Results for orders placed or performed in visit on 01/01/22  Hemoglobin A1c  Result Value Ref Range   Hgb A1c MFr Bld 7.2 (H) 4.8 - 5.6 %   Est. average glucose Bld gHb Est-mCnc 160 mg/dL  Lipid panel  Result Value Ref Range   Cholesterol, Total 113  100 - 199 mg/dL   Triglycerides 465 (H) 0 - 149 mg/dL   HDL 28 (L) >39 mg/dL   VLDL Cholesterol Cal 63 (H) 5 - 40 mg/dL   LDL Chol  Calc (NIH) 22 0 - 99 mg/dL   Chol/HDL Ratio 4.0 0.0 - 5.0 ratio  PSA  Result Value Ref Range   Prostate Specific Ag, Serum 0.7 0.0 - 4.0 ng/mL  VITAMIN D 25 Hydroxy (Vit-D Deficiency, Fractures)  Result Value Ref Range   Vit D, 25-Hydroxy 32.8 30.0 - 100.0 ng/mL    Assessment and Plan  Mild intermittent asthma with exacerbation Assessment & Plan: Acute, given ongoing symptoms greater than 7 to 10 days as well as patient's immunocompromise status, we will treat for possible initial viral infection with current bacterial superinfection.  Treat with azithromycin 5-day course. He can use albuterol 2 puffs inhaled every 4-6 hours as needed for wheeze. No clear indication at this point for prednisone taper but he will contact me if his breathing does not improve with treatment with antibiotics.  Return and ER precautions provided   Immunocompromised ([place])  Other orders -     Amoxicillin; Take 2 capsules (1,000 mg total) by mouth 2 (two) times daily.  Dispense: 40 capsule; Refill: 0    No follow-ups on file.   Eliezer Lofts, MD

## 2022-04-30 ENCOUNTER — Telehealth: Payer: Self-pay

## 2022-04-30 NOTE — Telephone Encounter (Signed)
[place] Night - Client Nonclinical Telephone Record  [facility] Client [place] Night - Client Client Site [place] - Night Provider Elsie Stain "Brigitte Pulse MD Contact Type Call Who Is Calling Pharmacy Call Type Pharmacy Message Only Reason for Call Request for medication pre authorization Initial Comment Caller is Ricky with [facility]. He sent an Rx request for a mutual Pt [name] that needs processed. Additional Comment Callback [phone] Message He needs to confirm if the request has been received yet and if the Pt is current with office and last apt, if possible. Office hours and fax provided. Disp. Time Disposition Final User 04/29/2022 5:56:30 PM General Information Provided Yes Bishop, Caleb Call Closed By: Vidal Schwalbe Transaction Date/Time: 04/29/2022 5:51:37 PM (ET    Please see S drive also.

## 2022-05-01 NOTE — Telephone Encounter (Signed)
Called [employer] about form that was faxed over about CPAP supplies. Advised them that we did not order the CPAP; all of this goes thru his pulmonary doctor. They are going to reach out to pulmonary.

## 2022-05-04 DIAGNOSIS — Z944 Liver transplant status: Principal | ICD-10-CM

## 2022-05-04 DIAGNOSIS — E612 Magnesium deficiency: Principal | ICD-10-CM

## 2022-05-04 DIAGNOSIS — Z5181 Encounter for therapeutic drug level monitoring: Principal | ICD-10-CM

## 2022-05-09 DIAGNOSIS — R69 Illness, unspecified: Principal | ICD-10-CM

## 2022-05-11 DIAGNOSIS — E612 Magnesium deficiency: Principal | ICD-10-CM

## 2022-05-11 DIAGNOSIS — Z944 Liver transplant status: Principal | ICD-10-CM

## 2022-05-11 DIAGNOSIS — Z5181 Encounter for therapeutic drug level monitoring: Principal | ICD-10-CM

## 2022-05-11 NOTE — Unmapped (Signed)
[facility] at [facility]  [address]  [place], [place] [address]    Clinical Pharmacist Visit Summary    Assessment and Plan:   1. Diabetes, post-transplant: fair control.  Last A1c = 7.4 on 04/06/2022 with goal <7% without hypoglycemia  BG data unavailable for today's visit. POCG of 138 mg/dL today which was fasting.    Takes metformin XR 1000 mg BID and Jardiance 10 mg daily with good adherence and without adverse effects or affordability concerns. Of note, Ozempic was prescribed at last visit, but was never picked due to high co-pay.   Diet is carbohydrate heavy, inclusive of frequent fast food and snacking. Physical activity is limited to ADL. Patient expressed interest in weight loss from Ozempic prescribed at last visit.  With shared decision making, will pursue manufacturer assistance through [employer] and start Ozempic 0.25 mg once weekly. Recommended following up with PCP for chronic diarrhea and to monitor for worsening symptoms while starting Ozempic. Instructed patient to discontinue Ozempic if symptoms significantly worsen and are no longer tolerable.   Patient has [employer] and is aware of ongoing contract negotiations with [facility] and potential impact to care.   Start Ozempic 0.25 mg subQ once weekly for 4 weeks, then increase to 0.5 mg weekly, if unable to tolerate please discontinue medication.  Patient and CPP completed/signed [employer] application; faxed to manufacturer on 3/19.  Provided education on administration, storage, missed doses, and side effects.  Continue metformin XR 1000 mg PO BID.  Continue Jardiance 10 mg PO daily.  Reviewed signs/symptoms/treatment of hypoglycemia.  Provided phone number and website for Medicare and instructed patient to call as Medicare Advantage open enrollment ends March 31st and he expressed interest in maintaining care through [facility]   Follow up not scheduled due to contract negotiations with [facility], instructed patient to call clinic to schedule an appointment if he pursues a different Medicare Advantage Plan or is willing to pay out-of-network copay.     Beryle Lathe, PharmD  PGY1 Ambulatory Care Pharmacy Resident     I have reviewed the resident's note and agree with the assessment and plan.     Theodosia Paling, PharmD, CPP, BCACP, CDCES    I spent a total of 40 minutes face to face with the patient delivering clinical care and providing education/counseling.     Subjective:   Reason for visit: Establish care with Clinical Pharmacist Practitioner for blood glucose review and adjustment of diabetes regimen as needed.    Anthony Alvarado is a 62 y.o. year old male with a history of diabetes post-transplant who presents today for a diabetes-related visit. PMH includes liver transplant (2022), hepatic steatosis, obesity, hypothyroidism, and HTN    Diabetes Provider and Last Visit Date: Dr. Rocco Serene, MD on 04/06/2022    At Last Visit:   1) We will start Ozempic 0.25 mg once a week injection. After 1 month, if doing well, then we can increase to the next dose.  2) Continue your metformin and jardiance for now.  3) Try to incorporate 1 mile of walking at least 5 days a week. Focus on the mornings while you have energy.  4) Try to replace a fast food meal with a home meal at least once a week.    Interval History of Present Illness:   Today, patient presents for diabetes follow up. He reports adherence to metformin XR 1000 mg BID and Jardiance 10 mg daily. Ozempic was prescribed at last visit with  Endocrinology, but patient never picked up the prescription due to high copay. Denies missed doses. Denies adverse effects to medications, but reports having chronic diarrhea around 3-4 days per week that has been occurring for around 3 months. Denies affordability concerns (has [employer]). Checks BG using glucometer at home, although infrequently checks BG. Denies hypoglycemia or BG <70 mg/dl. Diet and exercise as documented below. Discussed the importance of medication adherence, diet, and exercise to improve glycemic control. He had no additional questions or concerns.    Diet (Typical):  Breakfast: Cereal  Lunch: Fast food (Hamburgers/fries), grilled cheese sandwich   Dinner: Vegetables (lima/pinto beans/corn/ turnip greens), spaghetti, brown rice, tacos, hotdogs  Snacks (evening): Pretzels, chips, chocolate chip cookies  Beverages: Water, diet pepsi    Exercise: Denies regular physical activity, walking through ADL    Social History:   Tobacco use: no  Illicit drug use use: no  Alcohol use: no  Occupation: Unemployed  Household: Lives alone    POC glucose level today of 138 mg/dL is fasting,    Glucose Monitoring: Glucose Meter - Did not bring glucometer today.   Reports usually ~120-140s mg/dL, though checks infrequently (every couple of weeks to monthly).     Hypoglycemia:    Symptoms of hypoglycemia since last visit: no   If no recent hypoglycemia, prior recognition of hypoglycemia symptoms and knowledge of treatment: yes   Treats with:  n/a    Current Medications: Reports adherence to the following diabetes medications:   Ozempic 0.25 mg once weekly (never started)  Metformin XR 1000 mg BID   Jardiance 10 mg daily    CARDIOVASCULAR RISK REDUCTION  History of clinical ASCVD? no  History of heart failure? no  History of hyperlipidemia? yes  Taking statin? yes, pravastatin 40 mg daily  Taking aspirin? yes  Taking SGLT-2i and/or GLP- 1 RA? yes, Jardiance 10 mg daily    BLOOD PRESSURE CONTROL  History of hypertension?: yes  Taking ACEi/ARB? no    KIDNEY CARE  History of Chronic Kidney Disease? no  History of albuminuria? no  Taking SGLT-2i and/or GLP- 1 RA? yes  Taking ACEi/ARB? no      Current Outpatient Medications:     alendronate (FOSAMAX) 70 MG tablet, Take 1 tablet (70 mg total) by mouth Every Sunday. Take with a full glass of water on an empty stomach. (Patient taking differently: Take 1 tablet (70 mg total) by mouth Every Sunday. Pt reports that he has missed a few doses.), Disp: 13 tablet, Rfl: 3    aspirin 325 MG tablet, Take 1 tablet (325 mg total) by mouth daily., Disp: , Rfl:     azelastine (ASTELIN) 137 mcg (0.1 %) nasal spray, 1 spray into each nostril daily as needed., Disp: , Rfl:     blood sugar diagnostic (ONETOUCH ULTRA TEST) Strp, USE TO CHECK SUGAR DAILY, Disp: , Rfl:     blood sugar diagnostic Strp, Use as directed Three (3) times a day before meals., Disp: 90 each, Rfl: 11    calcium carbonate (TUMS) 200 mg calcium (500 mg) chewable tablet, Chew 1 tablet (200 mg of elem calcium total) daily. Pt reports taking every other day., Disp: , Rfl:     FEROCON 110-0.5 mg capsule, TAKE 1 CAPSULE BY MOUTH 2 (TWO) TIMES DAILY AFTER A MEAL. NOT COVERED BY INSURANCE., Disp: , Rfl:     fluticasone propionate (FLONASE) 50 mcg/actuation nasal spray, PLACE ONE OR TWO SPRAYS INTO BOTH NOSTRILS DAILY AS NEEDED., Disp: ,  Rfl:     JARDIANCE 10 mg tablet, Pt reports that he has not taken this medication in about 1 month due to the price., Disp: , Rfl:     lancets 33 gauge Misc, 1 each by Miscellaneous route Three (3) times a day before meals., Disp: 100 each, Rfl: 11    metFORMIN (GLUCOPHAGE-XR) 500 MG 24 hr tablet, Take 2 tablets (1,000 mg total) by mouth every morning AND 2 tablets (1,000 mg total) at bedtime., Disp: 360 tablet, Rfl: 3    metoprolol tartrate (LOPRESSOR) 25 MG tablet, Take 1 tablet (25 mg total) by mouth in the morning., Disp: , Rfl:     mycophenolate (CELLCEPT) 250 mg capsule, Take 1 capsule (250 mg total) by mouth Two (2) times a day., Disp: 60 capsule, Rfl: 11    pravastatin (PRAVACHOL) 40 MG tablet, Take 1 tablet (40 mg total) by mouth every evening., Disp: 90 tablet, Rfl: 3    semaglutide (OZEMPIC) 0.25 mg or 0.5 mg (2 mg/3 mL) PnIj, Start with 0.25 mg weekly for 4 weeks. If feeling well, then increase to 0.5 mg weekly., Disp: 3 mL, Rfl: 11    sertraline (ZOLOFT) 50 MG tablet, Take 1 tablet (50 mg total) by mouth daily., Disp: 90 tablet, Rfl: 3    tacrolimus (PROGRAF) 1 MG capsule, Take 2 capsules (2 mg total) by mouth two (2) times a day., Disp: 120 capsule, Rfl: 11    testosterone (ANDROGEL) 1 % (25 mg/2.5gram) GlPk, APPLY 2.5G (1 PACKET) TO EACH SHOULDER/UPPER ARM DAILY FOR A TOTAL DAILY DOSE OF 5G, Disp: 150 packet, Rfl: 0    levothyroxine (SYNTHROID) 150 MCG tablet, Take 1 tablet (150 mcg total) by mouth daily., Disp: 30 tablet, Rfl: 11  No current facility-administered medications for this visit.    Facility-Administered Medications Ordered in Other Visits:     diazePAM (VALIUM) 5 mg/mL injection, , , ,     Objective:   Vitals:    Vitals:    05/12/22 0929   BP: 172/98   Pulse: 62   Temp: 35.9 ??C (96.6 ??F)   Weight: 93.2 kg (205 lb 6.4 oz)       Past Medical History:    Active Ambulatory Problems     Diagnosis Date Noted    Diabetes mellitus without complication (CMS-HCC) 05/09/2017    Hypothyroidism 08/09/2006    Cirrhosis of liver without ascites (CMS-HCC) 01/10/2018    OSA (obstructive sleep apnea) 09/14/2011    Essential hypertension 08/09/2006    Encounter for pre-transplant evaluation for chronic liver disease 03/10/2018    Pleural effusion 09/09/2018    Pneumothorax after biopsy 09/11/2018    Liver transplant recipient (CMS-HCC) 09/16/2018    Allergic rhinitis 08/09/2006    Arthralgia 10/25/2017    Asthma 09/14/2011    Bicuspid aortic valve 02/25/2011    Flying phobia 09/18/2013    Mucosal abnormality of stomach 09/27/2018    Nephrolithiasis 07/28/2018    Pure hypercholesterolemia 02/14/2007    Umbilical hernia 08/14/2012    Unspecified hearing loss 08/11/2006    COVID-19 03/16/2019     Resolved Ambulatory Problems     Diagnosis Date Noted    No Resolved Ambulatory Problems     Past Medical History:   Diagnosis Date    Aortic valve stenosis     Autoimmune cholangitis     Carrier of hemochromatosis HFE gene mutation     Cholestatic cirrhosis (CMS-HCC)     Hepatic encephalopathy (CMS-HCC)     Hypertension  Other osteoporosis without current pathological fracture     Sleep apnea     Type 2 diabetes mellitus (CMS-HCC)        Wt Readings from Last 3 Encounters:   05/12/22 93.2 kg (205 lb 6.4 oz)   04/06/22 93.9 kg (207 lb)   08/28/21 92.4 kg (203 lb 9.6 oz)       Lab Results   Component Value Date    A1C 7.4 (H) 04/06/2022    A1C 6.6 (H) 09/18/2019    A1C 6.0 (H) 04/17/2019    A1C 5.9 (H) 12/19/2018    A1C 4.7 (L) 10/24/2018    A1C 4.4 (L) 09/15/2018    A1C 5.1 01/04/2018       Lab Results   Component Value Date    NA 138 03/31/2022    K 4.5 03/31/2022    CL 101 03/31/2022    CO2 19 (L) 03/31/2022    BUN 21 03/31/2022    CREATININE 1.15 03/31/2022    GLU 140 (H) 08/28/2021    CALCIUM 9.2 03/31/2022    ALBUMIN 4.2 08/28/2021    PHOS 3.3 08/28/2021       Lab Results   Component Value Date    ALKPHOS 98 03/31/2022    BILITOT 0.4 03/31/2022    BILIDIR 0.15 03/31/2022    PROT 7.4 03/31/2022    ALBUMIN 4.2 08/28/2021    ALT 52 (H) 03/31/2022    AST 31 03/31/2022       No results found for: [facility]    Lab Results   Component Value Date    CHOL 122 04/17/2019    CHOL 114 12/19/2018     Lab Results   Component Value Date    HDL 33 (L) 04/17/2019    HDL 33 (L) 12/19/2018     Lab Results   Component Value Date    LDL 52 (L) 04/17/2019    LDL 32 (L) 12/19/2018    LDL 79.6 03/21/2018     Lab Results   Component Value Date    VLDL 37 04/17/2019    VLDL 62.9 (H) 12/19/2018     Lab Results   Component Value Date    CHOLHDLRATIO 3.7 04/17/2019    CHOLHDLRATIO 3.5 12/19/2018     Lab Results   Component Value Date    TRIG 185 (H) 04/17/2019    TRIG 243 (H) 12/19/2018       The 10-year ASCVD risk score (Arnett DK, et al., 2019) is: 24.7%    Values used to calculate the score:      Age: 39 years      Sex: Male      Is Non-Hispanic African American: No      Diabetic: Yes      Tobacco smoker: No      Systolic Blood Pressure: 172 mmHg      Is BP treated: Yes      HDL Cholesterol: 28 mg/dL      Total Cholesterol: 113 mg/dL    Note: For patients with SBP <90 or >200, Total Cholesterol <130 or >320, HDL <20 or >100 which are outside of the allowable range, the calculator will use these upper or lower values to calculate the patient???s risk score.

## 2022-05-12 ENCOUNTER — Ambulatory Visit: Admit: 2022-05-12 | Discharge: 2022-05-13 | Payer: MEDICARE | Attending: Ambulatory Care | Primary: Ambulatory Care

## 2022-05-12 DIAGNOSIS — E119 Type 2 diabetes mellitus without complications: Secondary | ICD-10-CM | POA: Diagnosis not present

## 2022-05-12 NOTE — Unmapped (Signed)
No meter downloaded. POC glucose done today. PP Fasting. 138 mg/dL.

## 2022-05-12 NOTE — Unmapped (Addendum)
Anthony Alvarado,    It was a pleasure to see you today! As we discussed:     Please start Ozempic 0.25 mg once weekly for 4 weeks, then increase to 0.5 mg weekly, if unable to tolerate please discontinue medication.  Please continue metformin XR 1000 mg twice daily  Please continue Jardiance 10 mg daily   Please call Medicare at [phone] or visit [email or web address] to switch insurance plan if interested. Open enrollment is eligible through 05/24/2022.  Contact the clinic if you are having persistently low blood sugar readings <70 mg/dl or if you are having symptoms of low blood sugar.  Once insurance has been changed, please contact [facility] to schedule a follow-up appointment.     --------------------------------------------------------------------------------------------------------------------------------------------------------    I will fax your [employer] application for Ozempic by close of business today.    Please allow up to 5 business days for your application to be reviewed once faxed. You should receive a decision letter, e-mail, and/or text message directly from the program.     If your enrollment is approved, please allow an additional 14 business days for medication(s) to be shipped from to [facility] [employer]). Once the medication has been received by [facility], they will contact you to schedule delivery of the medication to your home.     Please note that it may take the program longer to process your application and/or receive your shipment based on the time of year (peak season is October through March).     Before contacting the Endocrinology clinic, please contact the program directly if you have questions regarding your enrollment or if you need a refill on your medication(s):  [employer] at [phone], Monday through Friday from 8:00 AM to 8:00 PM  [facility] at [phone], Monday through Friday from 9:00 AM to 4:30 PM to schedule the delivery of your medications for ([employer], [employer])    Please note that this is an annual application and the open enrollment period typically begins 60 days before your current enrollment ends. Please contact the Endocrinology clinic to submit your application for 2025 before your current enrollment ends to prevent gaps in your medication treatment.      Ozempic?? (semaglutide)     STORAGE  Prior to first use, store in refrigerator (36??F-46??F) until expiration date, do not freeze product   After first use, multi-dose pen may be stored for 56 days at controlled room temperature (59??F-86??F) or in a refrigerator (36??F-46??F)    ADMINISTRATION  Multi-dose pen, requires attachment of included pen needles  Administer once weekly on the same day each week, at any time of day, with or without meals  Inject subcutaneously in abdomen, thigh or upper arm  Press and hold down for 6 seconds or until the dose button until the dose counter shows 0  Always use new pen needle with each administration and rotate injection sites   Discard in a sharps container (hard plastic)   MISSED DOSE Administer Ozempic as soon as possible within 5 days after missed dose. If >5 days have passed, skip missed dose and administer on next scheduled administration and resume once weekly dosing    POTENTIAL SIDE EFFECTS Mild-moderate stomach upset, nausea, vomiting, constipation, diarrhea   Usually resolves overtime     Strategies to Help Stomach Upset   Reduce meal size, more frequent meals, mindfulness to stop eating once full, avoid eating when not hungry, avoid high fat or  spicy food, stay well hydrated

## 2022-05-13 NOTE — Unmapped (Signed)
[facility] at [facility]  [address]  [place], [place] [address]    Clinical Pharmacist Note: Manufacturer Assistance Program (MAP) Application    Patient was referred to this CPP to complete manufacturer assistance program (MAP) application(s) for the following medication(s):    Medication(s): OZEMPIC    Manufacturer: [employer]    Medication Shipment Location: [facility])    Patient Portion of Application: PROOF OF INCOME MISSING. PATIENT OPTED FOR ELECTRONIC INCOME VERIFICATION.  PATIENT SECTIONS REVIEWED, COMPLETED, AND SIGNED BY PATIENT.    Provider Portion of Application: PRESCRIBER SECTIONS COMPLETED AND SIGNED BY CPP.     Prescriptions: PRESCRIPTION(S) FOR OZEMPIC ON FILE AT [facility]    Application Faxed on:  05/12/2022 BY CPP WITH CONFIRMATION.      Approval: PENDING.    Refill Process:  AUTO REFILLS      Patient was informed that it may take up to 5 business days for their application(s) to be processed once faxed. If approved, it may take an additional 14 business days for medications to be delivered to [facility].    Advised patient to call:   [employer] Patient Assistance Program at [phone] (Monday through Friday, 8:00 AM to 8:00 PM) to check on the status of their application or to request refills on their medication(s).   [facility] at [phone] (Monday through Friday from 9:00 AM to 4:30 PM) to schedule the delivery of their medications.      Patient understands that it is their responsibility to follow up with CPP/Endocrinology provider if there are any issues with their application. They had no additional questions or concerns.     Saddie Benders?? PharmD, CPP, BCACP, CDCES  Clinical Pharmacist Practitioner - Endocrinology  [facility] [place]  Phone: [phone] - Fax: [phone]

## 2022-05-18 DIAGNOSIS — E612 Magnesium deficiency: Principal | ICD-10-CM

## 2022-05-18 DIAGNOSIS — Z944 Liver transplant status: Principal | ICD-10-CM

## 2022-05-18 DIAGNOSIS — Z5181 Encounter for therapeutic drug level monitoring: Principal | ICD-10-CM

## 2022-05-18 NOTE — Unmapped (Signed)
I was immediately available via phone/pager or present on site.  I reviewed and discussed the case with the fellow, but did not see the patient.  I agree with the assessment and plan as documented in the fellow's note. Gaetana Kawahara Jin Eshika Reckart, MD

## 2022-05-19 NOTE — Unmapped (Signed)
Called to advise patient that as of April 1st , [facility] will be considered out-of-network for all [employer] patients.  He said he just changed to [facility]

## 2022-05-19 NOTE — Unmapped (Signed)
[facility] Refill Coordination Note    Specialty Medication(s) to be Shipped:   Transplant: mycophenolate mofetil 250 mg and tacrolimus 1 mg    Other medication(s) to be shipped: No additional medications requested for fill at this time     [name], DOB: 14-Jan-1961  Phone: [phone] (home)       All above HIPAA information was verified with patient.     Was a Nurse, learning disability used for this call? No    Completed refill call assessment today to schedule patient's medication shipment from the [facility] [phone]).  All relevant notes have been reviewed.     Specialty medication(s) and dose(s) confirmed: Regimen is correct and unchanged.   Changes to medications: [name] reports no changes at this time.  Changes to insurance: No  New side effects reported not previously addressed with a pharmacist or physician: None reported  Questions for the pharmacist: No    Confirmed patient received a Conservation officer, historic buildings and a Surveyor, mining with first shipment. The patient will receive a drug information handout for each medication shipped and additional FDA Medication Guides as required.       DISEASE/MEDICATION-SPECIFIC INFORMATION        N/A    SPECIALTY MEDICATION ADHERENCE     Medication Adherence    Patient reported X missed doses in the last month: 0  Specialty Medication: mycophenolate 250 mg capsule (CELLCEPT)  Patient is on additional specialty medications: Yes  Additional Specialty Medications: tacrolimus 1 MG capsule (PROGRAF)  Patient Reported Additional Medication X Missed Doses in the Last Month: 0  Patient is on more than two specialty medications: No              Were doses missed due to medication being on hold? No    Mycophenolate 250 mg: 7 days of medicine on hand   Tacrolimus 1 mg: 7 days of medicine on hand        REFERRAL TO PHARMACIST     Referral to the pharmacist: Not needed      [facility]     Shipping address confirmed in Epic.     Patient was notified of new phone menu : No    Delivery Scheduled: Yes, Expected medication delivery date: 05/21/22.     Medication will be delivered via UPS to the prescription address in Epic WAM.    Willette Pa   [facility] Specialty Technician

## 2022-05-20 MED FILL — MYCOPHENOLATE MOFETIL 250 MG CAPSULE: ORAL | 30 days supply | Qty: 60 | Fill #10

## 2022-05-20 MED FILL — LEVOTHYROXINE 175 MCG TABLET: ORAL | 90 days supply | Qty: 90 | Fill #0

## 2022-05-20 MED FILL — TACROLIMUS 1 MG CAPSULE, IMMEDIATE-RELEASE: ORAL | 30 days supply | Qty: 120 | Fill #11

## 2022-05-21 ENCOUNTER — Other Ambulatory Visit: Payer: Self-pay

## 2022-05-21 MED ORDER — METOPROLOL TARTRATE 25 MG PO TABS: 25.0000 mg | ORAL_TABLET | Freq: Two times a day (BID) | ORAL | 0 refills | Status: DC

## 2022-05-21 MED ORDER — METOPROLOL TARTRATE 25 MG TABLET
ORAL_TABLET | Freq: Two times a day (BID) | ORAL | 0 refills | 90 days
Start: 2022-05-21 — End: ?

## 2022-05-25 DIAGNOSIS — Z5181 Encounter for therapeutic drug level monitoring: Principal | ICD-10-CM

## 2022-05-25 DIAGNOSIS — Z944 Liver transplant status: Principal | ICD-10-CM

## 2022-05-25 DIAGNOSIS — E612 Magnesium deficiency: Principal | ICD-10-CM

## 2022-05-27 MED ORDER — PRAVASTATIN 40 MG TABLET
ORAL_TABLET | Freq: Every evening | ORAL | 3 refills | 90 days
Start: 2022-05-27 — End: 2023-05-27

## 2022-05-27 NOTE — Unmapped (Signed)
Pt request for RX Refill

## 2022-05-29 DIAGNOSIS — E789 Disorder of lipoprotein metabolism, unspecified: Principal | ICD-10-CM

## 2022-05-29 DIAGNOSIS — Z944 Liver transplant status: Principal | ICD-10-CM

## 2022-05-29 DIAGNOSIS — E7800 Pure hypercholesterolemia, unspecified: Principal | ICD-10-CM

## 2022-05-29 MED ORDER — PRAVASTATIN 40 MG TABLET
ORAL_TABLET | Freq: Every evening | ORAL | 0 refills | 90 days | Status: CP
Start: 2022-05-29 — End: ?
  Filled 2022-06-04: qty 90, 90d supply, fill #0

## 2022-05-29 MED FILL — SERTRALINE 50 MG TABLET: ORAL | 90 days supply | Qty: 90 | Fill #3

## 2022-05-29 MED FILL — METOPROLOL TARTRATE 25 MG TABLET: ORAL | 90 days supply | Qty: 180 | Fill #0

## 2022-05-29 NOTE — Unmapped (Signed)
Received refill request for patient's pravastatin. Discussed with Dr.Shah, who approved of one more 3-mo refill, then future mgmt by his pcp or cardiologist. Sent rx and explained this to patient via MyChart. Also let him know if was time for standing liver txp labs.

## 2022-05-30 ENCOUNTER — Other Ambulatory Visit: Payer: Self-pay | Admitting: Family Medicine

## 2022-06-01 DIAGNOSIS — Z5181 Encounter for therapeutic drug level monitoring: Principal | ICD-10-CM

## 2022-06-01 DIAGNOSIS — Z944 Liver transplant status: Principal | ICD-10-CM

## 2022-06-01 DIAGNOSIS — E612 Magnesium deficiency: Principal | ICD-10-CM

## 2022-06-02 LAB — CBC W/ DIFFERENTIAL
BANDED NEUTROPHILS ABSOLUTE COUNT: 0.1 10*3/uL (ref 0.0–0.1)
BASOPHILS ABSOLUTE COUNT: 0.1 10*3/uL (ref 0.0–0.2)
BASOPHILS RELATIVE PERCENT: 1 %
EOSINOPHILS ABSOLUTE COUNT: 0.5 10*3/uL — ABNORMAL HIGH (ref 0.0–0.4)
EOSINOPHILS RELATIVE PERCENT: 5 %
HEMATOCRIT: 43.3 % (ref 37.5–51.0)
HEMOGLOBIN: 14.3 g/dL (ref 13.0–17.7)
IMMATURE GRANULOCYTES: 1 %
LYMPHOCYTES ABSOLUTE COUNT: 1.9 10*3/uL (ref 0.7–3.1)
LYMPHOCYTES RELATIVE PERCENT: 19 %
MEAN CORPUSCULAR HEMOGLOBIN CONC: 33 g/dL (ref 31.5–35.7)
MEAN CORPUSCULAR HEMOGLOBIN: 29.9 pg (ref 26.6–33.0)
MEAN CORPUSCULAR VOLUME: 90 fL (ref 79–97)
MONOCYTES ABSOLUTE COUNT: 1 10*3/uL — ABNORMAL HIGH (ref 0.1–0.9)
MONOCYTES RELATIVE PERCENT: 10 %
NEUTROPHILS ABSOLUTE COUNT: 6.5 10*3/uL (ref 1.4–7.0)
NEUTROPHILS RELATIVE PERCENT: 64 %
PLATELET COUNT: 245 10*3/uL (ref 150–450)
RED BLOOD CELL COUNT: 4.79 x10E6/uL (ref 4.14–5.80)
RED CELL DISTRIBUTION WIDTH: 12.8 % (ref 11.6–15.4)
WHITE BLOOD CELL COUNT: 10.1 10*3/uL (ref 3.4–10.8)

## 2022-06-02 LAB — GAMMA GT: GAMMA GLUTAMYL TRANSFERASE: 50 IU/L (ref 0–65)

## 2022-06-02 LAB — COMPREHENSIVE METABOLIC PANEL
A/G RATIO: 1.7 (ref 1.2–2.2)
ALBUMIN: 4.6 g/dL (ref 3.8–4.9)
ALKALINE PHOSPHATASE: 113 IU/L (ref 44–121)
ALT (SGPT): 42 IU/L (ref 0–44)
AST (SGOT): 25 IU/L (ref 0–40)
BILIRUBIN TOTAL (MG/DL) IN SER/PLAS: 0.3 mg/dL (ref 0.0–1.2)
BLOOD UREA NITROGEN: 22 mg/dL (ref 6–24)
BUN / CREAT RATIO: 21 — ABNORMAL HIGH (ref 9–20)
CALCIUM: 9.3 mg/dL (ref 8.7–10.2)
CHLORIDE: 102 mmol/L (ref 96–106)
CO2: 22 mmol/L (ref 20–29)
CREATININE: 1.05 mg/dL (ref 0.76–1.27)
GLOBULIN, TOTAL: 2.7 g/dL (ref 1.5–4.5)
GLUCOSE: 134 mg/dL — ABNORMAL HIGH (ref 70–99)
POTASSIUM: 4.7 mmol/L (ref 3.5–5.2)
SODIUM: 136 mmol/L (ref 134–144)
TOTAL PROTEIN: 7.3 g/dL (ref 6.0–8.5)

## 2022-06-02 LAB — BILIRUBIN, DIRECT: BILIRUBIN DIRECT: <0.1 mg/dL (ref 0.00–0.40)

## 2022-06-02 LAB — TACROLIMUS LEVEL: TACROLIMUS BLOOD: 6 ng/mL (ref 2.0–20.0)

## 2022-06-02 LAB — PHOSPHORUS: PHOSPHORUS, SERUM: 2.6 mg/dL — ABNORMAL LOW (ref 2.8–4.1)

## 2022-06-02 LAB — MAGNESIUM: MAGNESIUM: 1.7 mg/dL (ref 1.6–2.3)

## 2022-06-04 ENCOUNTER — Ambulatory Visit (INDEPENDENT_AMBULATORY_CARE_PROVIDER_SITE_OTHER): Payer: Medicare Other | Admitting: Internal Medicine

## 2022-06-04 ENCOUNTER — Encounter: Payer: Self-pay | Admitting: Internal Medicine

## 2022-06-04 VITALS — BP 122/80 | HR 96 | Temp 97.8°F | Ht 69.0 in | Wt 200.0 lb

## 2022-06-04 DIAGNOSIS — S335XXA Sprain of ligaments of lumbar spine, initial encounter: Secondary | ICD-10-CM | POA: Diagnosis not present

## 2022-06-04 MED ORDER — TIZANIDINE HCL 2 MG PO TABS: 2.0000 mg | ORAL_TABLET | Freq: Three times a day (TID) | ORAL | 0 refills | Status: DC | PRN

## 2022-06-04 NOTE — Progress Notes (Signed)
Subjective:    Patient ID: [name], male    DOB: 01/04/1961, 62 y.o.   MRN: [id number]  HPI Here due to low back pain  Pain right above right hip in back Dull throb--with occasional shooting pain when walking Started about a week ago Doesn't remember any injury---may have twisted a while before this started  Disabled --but still works in yard and on cars No heavy work  Does sense some pain ("tired ache") down his legs at times No leg weakness  Tried tylenol ---not much difference No topical Rx  Current Outpatient Medications on File Prior to Visit  Medication Sig Dispense Refill   acetaminophen (TYLENOL) 500 MG tablet Take 500 mg by mouth every 6 (six) hours as needed for moderate pain or headache.     albuterol (VENTOLIN HFA) 108 (90 Base) MCG/ACT inhaler Inhale 2 puffs into the lungs every 6 (six) hours as needed for wheezing or shortness of breath.     alendronate (FOSAMAX) 70 MG tablet Take 1 tablet (70 mg total) by mouth every Sunday. Take with a full glass of water on an empty stomach. 13 tablet 3   aspirin EC 325 MG EC tablet Take 1 tablet (325 mg total) by mouth daily.     azelastine (ASTELIN) 0.1 % nasal spray PLACE 2 SPRAYS INTO BOTH NOSTRILS TWICE DAILY AS NEEDED 30 mL 1   calcium carbonate (TUMS - DOSED IN MG ELEMENTAL CALCIUM) 500 MG chewable tablet Chew 1 tablet by mouth daily.     Cholecalciferol (VITAMIN D) 50 MCG (2000 UT) tablet Take 2,000 Units by mouth daily.     empagliflozin (JARDIANCE) 10 MG TABS tablet Take 1 tablet (10 mg total) by mouth daily before breakfast. 30 tablet 5   fluticasone (FLONASE) 50 MCG/ACT nasal spray PLACE ONE OR TWO SPRAYS INTO BOTH NOSTRILS DAILY AS NEEDED. 48 g 3   levothyroxine (SYNTHROID) 175 MCG tablet Take 1 tablet (175 mcg total) by mouth daily before breakfast. 90 tablet 3   metFORMIN (GLUCOPHAGE-XR) 500 MG 24 hr tablet Take 2 tablets (1,000 mg total) by mouth in the morning and at bedtime. 360 tablet 3   metoprolol  tartrate (LOPRESSOR) 25 MG tablet Take 1 tablet (25 mg total) by mouth 2 (two) times daily. 180 tablet 0   metroNIDAZOLE (METROGEL) 1 % gel Apply 1 application topically daily as needed (rosacea).     mycophenolate (CELLCEPT) 250 MG capsule Take 250 mg by mouth 2 (two) times daily.     NON FORMULARY Pt uses a cpap nightly     OneTouch Delica Lancets 33G MISC USE TO CHECK SUGAR DAILY 100 each 3   ONETOUCH ULTRA test strip USE TO CHECK SUGAR DAILY 100 strip 0   pravastatin (PRAVACHOL) 40 MG tablet Take 40 mg by mouth every evening.     sertraline (ZOLOFT) 50 MG tablet Take 1 tablet (50 mg total) by mouth daily. 90 tablet 3   tacrolimus (PROGRAF) 1 MG capsule Take 2 mg by mouth 2 (two) times daily.     No current facility-administered medications on file prior to visit.    Allergies  Allergen Reactions   Watermelon Flavor Itching    Mouth itching    Past Medical History:  Diagnosis Date   Allergy    Asthma    Bicuspid aortic valve    sees dr Tenny Crawross   Cirrhosis    Coronary artery disease    DM2 (diabetes mellitus, type 2)    Fatty liver  with h/o elevated LFT's   GERD (gastroesophageal reflux disease)    Heart murmur    Hypertension    Itching    all over last few months   Jaundice    Liver transplant recipient    09/16/2018 at [facility]   Migraine with aura    OSA (obstructive sleep apnea) 09/14/2011   PSG 11/08/11>>AHI 31.6, SpO2 low 85%. wears CPAP, pt does not know settings   Pulmonary embolism    2022   Thyroid disease     Past Surgical History:  Procedure Laterality Date   AORTIC VALVE REPLACEMENT N/A 04/03/2021   Procedure: AORTIC VALVE REPLACEMENT (AVR) USING 27 MM INSPIRIS RESILIA  AORTIC VALVE;  Surgeon: Alleen Borne, MD;  Location: [facility] OR;  Service: Open Heart Surgery;  Laterality: N/A;   APPENDECTOMY  11/2007   Emergency   BIOPSY THYROID  08/19/2007   Attempted, no tissue obtained   CARDIAC CATHETERIZATION  03/21/2018   CARDIOVASCULAR STRESS TEST  04/2005    Negative 06/05   CHOLECYSTECTOMY     CORONARY ARTERY BYPASS GRAFT N/A 04/03/2021   Procedure: CORONARY ARTERY BYPASS GRAFTING (CABG) x ONE ON CARDIOPULMONARY BYPASS. LIMA TO LAD;  Surgeon: Alleen Borne, MD;  Location: [facility] OR;  Service: Open Heart Surgery;  Laterality: N/A;   DOPPLER ECHOCARDIOGRAPHY  07/2002   ESOPHAGEAL BANDING N/A 05/04/2016   Procedure: ESOPHAGEAL BANDING;  Surgeon: Iva Boop, MD;  Location: [facility];  Service: Endoscopy;  Laterality: N/A;   ESOPHAGOGASTRODUODENOSCOPY (EGD) WITH PROPOFOL N/A 05/04/2016   Procedure: ESOPHAGOGASTRODUODENOSCOPY (EGD) WITH PROPOFOL;  Surgeon: Iva Boop, MD;  Location: [facility];  Service: Endoscopy;  Laterality: N/A;   LIVER TRANSPLANT     09/16/2018 at [facility]   RIGHT/LEFT HEART CATH AND CORONARY ANGIOGRAPHY N/A 12/20/2020   Procedure: RIGHT/LEFT HEART CATH AND CORONARY ANGIOGRAPHY;  Surgeon: Kathleene Hazel, MD;  Location: [facility] INVASIVE CV LAB;  Service: Cardiovascular;  Laterality: N/A;   TEE WITHOUT CARDIOVERSION N/A 04/03/2021   Procedure: TRANSESOPHAGEAL ECHOCARDIOGRAM (TEE);  Surgeon: Alleen Borne, MD;  Location: [facility] OR;  Service: Open Heart Surgery;  Laterality: N/A;    Family History  Problem Relation Age of Onset   Asthma Mother    Cancer Father        Died when pt was 43 of CA with mets, site unknown, COPD   COPD Father    Heart disease Sister        Heart stopped   Liver disease Sister        "gene" for liver disease   Colon cancer Maternal Grandmother    Heart disease Maternal Grandfather        MI, 64 YOA   Prostate cancer Maternal Uncle    Esophageal cancer Neg Hx    Rectal cancer Neg Hx    Stomach cancer Neg Hx     Social History   Socioeconomic History   Marital status: Legally Separated    Spouse name: Not on file   Number of children: 1   Years of education: 12   Highest education level: High school graduate  Occupational History   Occupation: Youth worker: [employer]    Comment: Manages "front" and writes orders --in [place]   Occupation: Disabled  Tobacco Use   Smoking status: Never    Passive exposure: Past   Smokeless tobacco: Never  Vaping Use   Vaping Use: Never used  Substance and Sexual Activity   Alcohol use:  No   Drug use: No   Sexual activity: Not on file  Other Topics Concern   Not on file  Social History Narrative   Separated   1 child and 2 stepchildren   Prev ran a car shop   Social Determinants of Health   Financial Resource Strain: Low Risk  (12/31/2021)   Overall Financial Resource Strain (CARDIA)    Difficulty of Paying Living Expenses: Not very hard  Food Insecurity: No Food Insecurity (12/31/2021)   Hunger Vital Sign    Worried About Running Out of Food in the Last Year: Never true    Ran Out of Food in the Last Year: Never true  Transportation Needs: No Transportation Needs (12/31/2021)   PRAPARE - Administrator, Civil Service (Medical): No    Lack of Transportation (Non-Medical): No  Physical Activity: Inactive (12/31/2021)   Exercise Vital Sign    Days of Exercise per Week: 0 days    Minutes of Exercise per Session: 0 min  Stress: No Stress Concern Present (12/31/2021)   [employer] of Occupational Health - Occupational Stress Questionnaire    Feeling of Stress : Not at all  Social Connections: Moderately Isolated (12/31/2021)   Social Connection and Isolation Panel [NHANES]    Frequency of Communication with Friends and Family: More than three times a week    Frequency of Social Gatherings with Friends and Family: Once a week    Attends Religious Services: More than 4 times per year    Active Member of [employer] or Organizations: No    Attends Banker Meetings: Never    Marital Status: Separated  Intimate Partner Violence: Not At Risk (12/31/2021)   Humiliation, Afraid, Rape, and Kick questionnaire    Fear of Current or Ex-Partner: No    Emotionally Abused: No    Physically  Abused: No    Sexually Abused: No   Review of Systems No change in sensation around groin Bowels have changed in past 3 months---looser. No loss of control--urgency at times No loss of urinary control--but going more (some urgency) No history of kidney stone---no hematuria    Objective:   Physical Exam Constitutional:      Appearance: Normal appearance.  Musculoskeletal:     Comments: No spine tenderness Pain area is right lateral upper lumbar No CVA tenderness SLR negative bilaterally Normal ROM in hips  Neurological:     Mental Status: He is alert.     Comments: Fairly normal gait No leg weakness            Assessment & Plan:

## 2022-06-04 NOTE — Assessment & Plan Note (Signed)
Discussed that this is muscular and should be self limited Can use tylenol if any help Heat ---shower or heating pad Rx tizanidine for prn use

## 2022-06-08 DIAGNOSIS — Z5181 Encounter for therapeutic drug level monitoring: Principal | ICD-10-CM

## 2022-06-08 DIAGNOSIS — Z944 Liver transplant status: Principal | ICD-10-CM

## 2022-06-08 DIAGNOSIS — E612 Magnesium deficiency: Principal | ICD-10-CM

## 2022-06-12 DIAGNOSIS — Z796 Long-term use of immunosuppressant medication: Principal | ICD-10-CM

## 2022-06-12 DIAGNOSIS — Z944 Liver transplant status: Principal | ICD-10-CM

## 2022-06-12 MED ORDER — TACROLIMUS 1 MG CAPSULE, IMMEDIATE-RELEASE
ORAL_CAPSULE | Freq: Two times a day (BID) | ORAL | 11 refills | 30 days
Start: 2022-06-12 — End: ?

## 2022-06-12 NOTE — Unmapped (Signed)
[facility] Refill Coordination Note    Specialty Medication(s) to be Shipped:   Transplant: mycophenolate mofetil 250 mg and tacrolimus 1 mg    Other medication(s) to be shipped: meformin     Anthony Alvarado, DOB: [birth year]  Phone: [phone] (home)       All above HIPAA information was verified with patient.     Was a Nurse, learning disability used for this call? No    Completed refill call assessment today to schedule patient's medication shipment from the [facility] [phone]).  All relevant notes have been reviewed.     Specialty medication(s) and dose(s) confirmed: Regimen is correct and unchanged.   Changes to medications: Anthony Alvarado reports no changes at this time.  Changes to insurance: No  New side effects reported not previously addressed with a pharmacist or physician: None reported  Questions for the pharmacist: No    Confirmed patient received a Conservation officer, historic buildings and a Surveyor, mining with first shipment. The patient will receive a drug information handout for each medication shipped and additional FDA Medication Guides as required.       DISEASE/MEDICATION-SPECIFIC INFORMATION        N/A    SPECIALTY MEDICATION ADHERENCE     Medication Adherence    Patient reported X missed doses in the last month: 0  Specialty Medication: mycophenolate 250 mg capsule (CELLCEPT)  Patient is on additional specialty medications: Yes  Additional Specialty Medications: tacrolimus 1 MG capsule (PROGRAF)  Patient Reported Additional Medication X Missed Doses in the Last Month: 0  Patient is on more than two specialty medications: No              Were doses missed due to medication being on hold? No    Mycophenolate 250 mg: 7 days of medicine on hand   Tacrolimus 1 mg: 7 days of medicine on hand        REFERRAL TO PHARMACIST     Referral to the pharmacist: Not needed      [facility]     Shipping address confirmed in Epic.     Patient was notified of new phone menu : No    Delivery Scheduled: Yes, Expected medication delivery date: 06/17/22.     Medication will be delivered via UPS to the prescription address in Epic WAM.    Anthony Alvarado   [facility] Specialty Technician

## 2022-06-12 NOTE — Unmapped (Signed)
Pt request for RX Refill

## 2022-06-15 DIAGNOSIS — Z944 Liver transplant status: Principal | ICD-10-CM

## 2022-06-15 DIAGNOSIS — E612 Magnesium deficiency: Principal | ICD-10-CM

## 2022-06-15 DIAGNOSIS — Z5181 Encounter for therapeutic drug level monitoring: Principal | ICD-10-CM

## 2022-06-15 MED ORDER — TACROLIMUS 1 MG CAPSULE, IMMEDIATE-RELEASE
ORAL_CAPSULE | Freq: Two times a day (BID) | ORAL | 11 refills | 30 days | Status: CP
Start: 2022-06-15 — End: ?
  Filled 2022-06-16: qty 120, 30d supply, fill #0

## 2022-06-16 MED FILL — METFORMIN ER 500 MG TABLET,EXTENDED RELEASE 24 HR: ORAL | 90 days supply | Qty: 360 | Fill #3

## 2022-06-16 MED FILL — MYCOPHENOLATE MOFETIL 250 MG CAPSULE: ORAL | 30 days supply | Qty: 60 | Fill #11

## 2022-06-22 DIAGNOSIS — E612 Magnesium deficiency: Principal | ICD-10-CM

## 2022-06-22 DIAGNOSIS — Z944 Liver transplant status: Principal | ICD-10-CM

## 2022-06-22 DIAGNOSIS — Z5181 Encounter for therapeutic drug level monitoring: Principal | ICD-10-CM

## 2022-06-29 DIAGNOSIS — Z944 Liver transplant status: Principal | ICD-10-CM

## 2022-06-29 DIAGNOSIS — E612 Magnesium deficiency: Principal | ICD-10-CM

## 2022-06-29 DIAGNOSIS — Z5181 Encounter for therapeutic drug level monitoring: Principal | ICD-10-CM

## 2022-07-02 NOTE — Unmapped (Signed)
Lvm for pt re scheduling annual appt. Asked for call back and left call-back number. 1st call.

## 2022-07-06 DIAGNOSIS — Z5181 Encounter for therapeutic drug level monitoring: Principal | ICD-10-CM

## 2022-07-06 DIAGNOSIS — E612 Magnesium deficiency: Principal | ICD-10-CM

## 2022-07-06 DIAGNOSIS — Z944 Liver transplant status: Principal | ICD-10-CM

## 2022-07-13 DIAGNOSIS — Z944 Liver transplant status: Principal | ICD-10-CM

## 2022-07-13 DIAGNOSIS — Z5181 Encounter for therapeutic drug level monitoring: Principal | ICD-10-CM

## 2022-07-13 DIAGNOSIS — E612 Magnesium deficiency: Principal | ICD-10-CM

## 2022-07-13 NOTE — Unmapped (Signed)
Pt returned 5/9 call from this tpa re scheduling annual appt. Scheduled for 7/23 at 10:30. He will get labs week before, denied need for appt letter, and verbalized understanding of all discussed.

## 2022-07-14 DIAGNOSIS — E612 Magnesium deficiency: Secondary | ICD-10-CM | POA: Diagnosis not present

## 2022-07-14 DIAGNOSIS — Z944 Liver transplant status: Secondary | ICD-10-CM | POA: Diagnosis not present

## 2022-07-14 DIAGNOSIS — Z5181 Encounter for therapeutic drug level monitoring: Secondary | ICD-10-CM | POA: Diagnosis not present

## 2022-07-15 LAB — CBC W/ DIFFERENTIAL
BANDED NEUTROPHILS ABSOLUTE COUNT: 0.1 10*3/uL (ref 0.0–0.1)
BASOPHILS ABSOLUTE COUNT: 0.1 10*3/uL (ref 0.0–0.2)
BASOPHILS RELATIVE PERCENT: 1 %
EOSINOPHILS ABSOLUTE COUNT: 0.9 10*3/uL — ABNORMAL HIGH (ref 0.0–0.4)
EOSINOPHILS RELATIVE PERCENT: 7 %
HEMATOCRIT: 44.5 % (ref 37.5–51.0)
HEMOGLOBIN: 14.5 g/dL (ref 13.0–17.7)
IMMATURE GRANULOCYTES: 1 %
LYMPHOCYTES ABSOLUTE COUNT: 2 10*3/uL (ref 0.7–3.1)
LYMPHOCYTES RELATIVE PERCENT: 16 %
MEAN CORPUSCULAR HEMOGLOBIN CONC: 32.6 g/dL (ref 31.5–35.7)
MEAN CORPUSCULAR HEMOGLOBIN: 28.8 pg (ref 26.6–33.0)
MEAN CORPUSCULAR VOLUME: 89 fL (ref 79–97)
MONOCYTES ABSOLUTE COUNT: 1.3 10*3/uL — ABNORMAL HIGH (ref 0.1–0.9)
MONOCYTES RELATIVE PERCENT: 10 %
NEUTROPHILS ABSOLUTE COUNT: 8.1 10*3/uL — ABNORMAL HIGH (ref 1.4–7.0)
NEUTROPHILS RELATIVE PERCENT: 65 %
PLATELET COUNT: 324 10*3/uL (ref 150–450)
RED BLOOD CELL COUNT: 5.03 x10E6/uL (ref 4.14–5.80)
RED CELL DISTRIBUTION WIDTH: 12.4 % (ref 11.6–15.4)
WHITE BLOOD CELL COUNT: 12.5 10*3/uL — ABNORMAL HIGH (ref 3.4–10.8)

## 2022-07-15 LAB — COMPREHENSIVE METABOLIC PANEL
A/G RATIO: 1.6 (ref 1.2–2.2)
ALBUMIN: 4.5 g/dL (ref 3.8–4.9)
ALKALINE PHOSPHATASE: 117 IU/L (ref 44–121)
ALT (SGPT): 32 IU/L (ref 0–44)
AST (SGOT): 22 IU/L (ref 0–40)
BILIRUBIN TOTAL (MG/DL) IN SER/PLAS: <0.2 mg/dL (ref 0.0–1.2)
BLOOD UREA NITROGEN: 21 mg/dL (ref 8–27)
BUN / CREAT RATIO: 18 (ref 10–24)
CALCIUM: 10.3 mg/dL — ABNORMAL HIGH (ref 8.6–10.2)
CHLORIDE: 101 mmol/L (ref 96–106)
CO2: 20 mmol/L (ref 20–29)
CREATININE: 1.14 mg/dL (ref 0.76–1.27)
GLOBULIN, TOTAL: 2.9 g/dL (ref 1.5–4.5)
GLUCOSE: 136 mg/dL — ABNORMAL HIGH (ref 70–99)
POTASSIUM: 5 mmol/L (ref 3.5–5.2)
SODIUM: 137 mmol/L (ref 134–144)
TOTAL PROTEIN: 7.4 g/dL (ref 6.0–8.5)

## 2022-07-15 LAB — BILIRUBIN, DIRECT: BILIRUBIN DIRECT: <0.1 mg/dL (ref 0.00–0.40)

## 2022-07-15 LAB — GAMMA GT: GAMMA GLUTAMYL TRANSFERASE: 39 IU/L (ref 0–65)

## 2022-07-15 LAB — MAGNESIUM: MAGNESIUM: 1.8 mg/dL (ref 1.6–2.3)

## 2022-07-15 LAB — PHOSPHORUS: PHOSPHORUS, SERUM: 3.7 mg/dL (ref 2.8–4.1)

## 2022-07-16 DIAGNOSIS — Z796 Long-term use of immunosuppressant medication: Principal | ICD-10-CM

## 2022-07-16 DIAGNOSIS — Z944 Liver transplant status: Principal | ICD-10-CM

## 2022-07-16 LAB — TACROLIMUS LEVEL: TACROLIMUS BLOOD: 5.3 ng/mL (ref 2.0–20.0)

## 2022-07-16 MED ORDER — MYCOPHENOLATE MOFETIL 250 MG CAPSULE
ORAL_CAPSULE | Freq: Two times a day (BID) | ORAL | 3 refills | 90 days | Status: CP
Start: 2022-07-16 — End: ?
  Filled 2022-07-22: qty 180, 90d supply, fill #0

## 2022-07-16 NOTE — Unmapped (Signed)
Pt request for RX Refill

## 2022-07-16 NOTE — Unmapped (Signed)
[facility] Refill Coordination Note    Specialty Medication(s) to be Shipped:   Transplant: mycophenolate mofetil 250mg  and tacrolimus 1mg     Other medication(s) to be shipped: No additional medications requested for fill at this time     Anthony Alvarado, DOB: [birth year]  Phone: [phone] (home)       All above HIPAA information was verified with patient.     Was a Nurse, learning disability used for this call? No    Completed refill call assessment today to schedule patient's medication shipment from the [facility] [phone]).  All relevant notes have been reviewed.     Specialty medication(s) and dose(s) confirmed: Regimen is correct and unchanged.   Changes to medications: Anthony Alvarado reports no changes at this time.  Changes to insurance: No  New side effects reported not previously addressed with a pharmacist or physician: None reported  Questions for the pharmacist: No    Confirmed patient received a Conservation officer, historic buildings and a Surveyor, mining with first shipment. The patient will receive a drug information handout for each medication shipped and additional FDA Medication Guides as required.       DISEASE/MEDICATION-SPECIFIC INFORMATION        N/A    SPECIALTY MEDICATION ADHERENCE     Medication Adherence    Patient reported X missed doses in the last month: 0  Specialty Medication: Tacrolimus 1mg   Patient is on additional specialty medications: Yes  Additional Specialty Medications: Mycophenolate 250mg   Patient Reported Additional Medication X Missed Doses in the Last Month: 0  Patient is on more than two specialty medications: No              Were doses missed due to medication being on hold? No    Mycophenolate 250 mg: 10 days of medicine on hand   Tacrolimus 1 mg: 10 days of medicine on hand     REFERRAL TO PHARMACIST     Referral to the pharmacist: Not needed      [facility]     Shipping address confirmed in Epic.       Delivery Scheduled: Yes, Expected medication delivery date: 07/23/22.     Medication will be delivered via UPS to the prescription address in Epic WAM.    [name], [facility]   [facility] Specialty Pharmacist

## 2022-07-17 DIAGNOSIS — G4733 Obstructive sleep apnea (adult) (pediatric): Secondary | ICD-10-CM | POA: Diagnosis not present

## 2022-07-20 DIAGNOSIS — E612 Magnesium deficiency: Principal | ICD-10-CM

## 2022-07-20 DIAGNOSIS — Z944 Liver transplant status: Principal | ICD-10-CM

## 2022-07-20 DIAGNOSIS — Z5181 Encounter for therapeutic drug level monitoring: Principal | ICD-10-CM

## 2022-07-22 MED FILL — TACROLIMUS 1 MG CAPSULE, IMMEDIATE-RELEASE: ORAL | 30 days supply | Qty: 120 | Fill #1

## 2022-07-30 ENCOUNTER — Encounter (HOSPITAL_BASED_OUTPATIENT_CLINIC_OR_DEPARTMENT_OTHER): Payer: Self-pay | Admitting: Pulmonary Disease

## 2022-07-30 ENCOUNTER — Ambulatory Visit (HOSPITAL_BASED_OUTPATIENT_CLINIC_OR_DEPARTMENT_OTHER): Payer: Medicare Other | Admitting: Pulmonary Disease

## 2022-07-30 VITALS — BP 126/82 | HR 76 | Ht 69.0 in | Wt 187.0 lb

## 2022-07-30 DIAGNOSIS — J301 Allergic rhinitis due to pollen: Secondary | ICD-10-CM | POA: Diagnosis not present

## 2022-07-30 DIAGNOSIS — G4733 Obstructive sleep apnea (adult) (pediatric): Secondary | ICD-10-CM | POA: Diagnosis not present

## 2022-07-30 DIAGNOSIS — J310 Chronic rhinitis: Secondary | ICD-10-CM

## 2022-07-30 NOTE — Patient Instructions (Signed)
Will have [employer] change your auto CPAP to 5 to 12 cm water pressure  Follow up in 1 year

## 2022-07-30 NOTE — Progress Notes (Signed)
Pulmonary, Critical Care, and Sleep Medicine  Chief Complaint  Patient presents with   Follow-up    F/U for OSA. States he has been using his machine nightly. Denies any issues with machine.     Past Surgical History:  He  has a past surgical history that includes Cardiovascular stress test (04/2005); doppler echocardiography (07/2002); Biopsy thyroid (08/19/2007); Appendectomy (11/2007); Esophagogastroduodenoscopy (egd) with propofol (N/A, 05/04/2016); esophageal banding (N/A, 05/04/2016); Cardiac catheterization (03/21/2018); Liver transplant; RIGHT/LEFT HEART CATH AND CORONARY ANGIOGRAPHY (N/A, 12/20/2020); Cholecystectomy; Aortic valve replacement (N/A, 04/03/2021); Coronary artery bypass graft (N/A, 04/03/2021); and TEE without cardioversion (N/A, 04/03/2021).  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 male with obstructive sleep apnea.      Subjective:   I last saw him in 2022.  He had TAVR last year.  Breathing better after this, but still gets winded if he goes up steps or a hill.  Uses CPAP nightly.  Had sinus congestion during allergy season and made it more difficult to use CPAP.  His nose sprays helped.  Mouth was more dry when sinuses were congested.  Has a full face mask.  No issue with mask fit.  Physical Exam:   Appearance - well kempt   ENMT - no sinus tenderness, no oral exudate, no LAN, Mallampati 3 airway, no stridor  Respiratory - equal breath sounds bilaterally, no wheezing or rales  CV - s1s2 regular rate and rhythm, 2/6 systolic click  Ext - no clubbing, no edema  Skin - no rashes  Psych - normal mood and affect    Chest Imaging:  CT chest 09/17/20 >>  lungs clear  Sleep Tests:  PSG 11/08/11>>AHI 31.6, SpO2 low 85%. Auto CPAP 04/29/22 to 07/27/22 >> used on 90 of 90 nights with average 9 hrs 8 min.  Average AHI 13.8 with median CPAP 7 and 95 th percentile CPAP 10 cm H2O  Cardiac Tests:  Echo 05/15/21 >> EF 55 to 60%, mod LVH, grade 2 DD, s/p AVR  Social History:  He  reports that he has never smoked. He has been exposed to tobacco smoke. He has never used smokeless tobacco. He reports that he does not drink alcohol and does not use drugs.  Family History:  His family history includes Asthma in his mother; COPD in his father; Cancer in his father; Colon cancer in his maternal grandmother; Heart disease in his maternal grandfather and sister; Liver disease in his sister; Prostate cancer in his maternal uncle.     Assessment/Plan:   Obstructive sleep apnea. - he is compliant with CPAP and reports benefit from therapy - uses [employer] for his DME - current CPAP ordered November 2022 - will change his auto CPAP to 5 to 12 cm H2O  CPAP rhinitis, Allergic rhinitis. - prn flonase, astelin  CAD, s/p TAVR. - followed by Dr. Dietrich Pates with cardiology  Cryptogenic cirrhosis with hepatocellular carcinoma s/p liver transplant. - followed at [facility]  Time Spent Involved in Patient Care on Day of Examination:  25 minutes  Follow up:   Patient Instructions  Will have [employer] change your auto CPAP to 5 to  12 cm water pressure  Follow up in 1 year  Medication List:   Allergies as of 07/30/2022       Reactions   Watermelon Flavor Itching   Mouth itching        Medication List        Accurate as of July 30, 2022  8:36 AM. If you have any questions, ask your nurse or doctor.          acetaminophen 500 MG tablet Commonly known as: TYLENOL Take 500 mg by mouth every 6 (six) hours as needed for moderate pain or headache.   albuterol 108 (90 Base) MCG/ACT inhaler Commonly known as: VENTOLIN HFA Inhale 2 puffs into the lungs  every 6 (six) hours as needed for wheezing or shortness of breath.   alendronate 70 MG tablet Commonly known as: FOSAMAX Take 1 tablet (70 mg total) by mouth every Sunday. Take with a full glass of water on an empty stomach.   aspirin EC 325 MG tablet Take 1 tablet (325 mg total) by mouth daily.   azelastine 0.1 % nasal spray Commonly known as: ASTELIN PLACE 2 SPRAYS INTO BOTH NOSTRILS TWICE DAILY AS NEEDED   calcium carbonate 500 MG chewable tablet Commonly known as: TUMS - dosed in mg elemental calcium Chew 1 tablet by mouth daily.   empagliflozin 10 MG Tabs tablet Commonly known as: JARDIANCE Take 1 tablet (10 mg total) by mouth daily before breakfast.   fluticasone 50 MCG/ACT nasal spray Commonly known as: FLONASE PLACE ONE OR TWO SPRAYS INTO BOTH NOSTRILS DAILY AS NEEDED.   levothyroxine 175 MCG tablet Commonly known as: SYNTHROID Take 1 tablet (175 mcg total) by mouth daily before breakfast.   metFORMIN 500 MG 24 hr tablet Commonly known as: GLUCOPHAGE-XR Take 2 tablets (1,000 mg total) by mouth in the morning and at bedtime.   metoprolol tartrate 25 MG tablet Commonly known as: LOPRESSOR Take 1 tablet (25 mg total) by mouth 2 (two) times daily.   metroNIDAZOLE 1 % gel Commonly known as: METROGEL Apply 1 application topically daily as needed (rosacea).   mycophenolate 250 MG capsule Commonly known as: CELLCEPT Take 250 mg by mouth 2 (two) times daily.   NON FORMULARY Pt uses a cpap nightly   OneTouch Delica Lancets 33G Misc USE TO CHECK SUGAR DAILY   OneTouch Ultra test strip Generic drug: glucose blood USE TO CHECK SUGAR DAILY   Ozempic (0.25 or 0.5 MG/DOSE) 2 MG/1.5ML Sopn Generic drug: Semaglutide(0.25 or 0.5MG /DOS) Inject 0.25 mg into the skin once a week.   pravastatin 40 MG tablet Commonly known as: PRAVACHOL Take 40 mg by mouth every evening.   sertraline 50 MG tablet Commonly known as: ZOLOFT Take 1 tablet (50 mg total) by mouth daily.    tacrolimus 1 MG capsule Commonly known as: PROGRAF Take 2 mg by mouth 2 (two) times daily.   tiZANidine 2 MG tablet Commonly known as: ZANAFLEX Take 1-2 tablets (2-4 mg total) by mouth 3 (three) times daily as needed for muscle spasms.   Vitamin D 50 MCG (2000 UT) tablet Take 2,000 Units by mouth daily.        Signature:  Coralyn Helling, MD [facility] Pulmonary/Critical Care Pager - [phone] 07/30/2022, 8:36 AM

## 2022-08-13 ENCOUNTER — Other Ambulatory Visit: Payer: Self-pay | Admitting: Internal Medicine

## 2022-08-13 MED ORDER — LEVOTHYROXINE 175 MCG TABLET
ORAL_TABLET | Freq: Every day | ORAL | 1 refills | 90 days
Start: 2022-08-13 — End: ?

## 2022-08-13 NOTE — Telephone Encounter (Signed)
Pt's pharmacy is requesting a refill on levothyroxine 175 mcg. Would Dr. Tenny Craw like to refill this medication? Please address

## 2022-08-14 ENCOUNTER — Telehealth: Payer: Self-pay | Admitting: Pharmacist

## 2022-08-14 MED FILL — LEVOTHYROXINE 175 MCG TABLET: ORAL | 90 days supply | Qty: 90 | Fill #0

## 2022-08-14 NOTE — Progress Notes (Unsigned)
Contacted patient regarding upcoming appointment with [facility] pharmacist.   He notes he started Ozempic per [facility] End[facility] ~ 8 weeks ago, stayed on 0.25 mg weekly for 6 weeks and stopped for the past 2 weeks. Notes loose, watery stools and significant fecal frequency that he believes to have started when he started Ozempic. No improvement since stopping Ozempic 2 weeks ago. Denies any abdominal pain, but does indicate right sided "soreness".   He follows up with [facility] End[facility] clinical pharmacist on Tuesday, but they encouraged to contact primary care if symptoms have not resolved. Will route to PCP for recommendation.   We discussed focusing on staying well hydrated over the weekend.

## 2022-08-16 ENCOUNTER — Emergency Department (HOSPITAL_BASED_OUTPATIENT_CLINIC_OR_DEPARTMENT_OTHER): Payer: Medicare Other

## 2022-08-16 ENCOUNTER — Emergency Department (HOSPITAL_BASED_OUTPATIENT_CLINIC_OR_DEPARTMENT_OTHER)
Admission: EM | Admit: 2022-08-16 | Discharge: 2022-08-16 | Disposition: A | Discharge status: Home / Self Care | Payer: Medicare Other | Attending: Emergency Medicine | Admitting: Emergency Medicine

## 2022-08-16 ENCOUNTER — Other Ambulatory Visit: Payer: Self-pay

## 2022-08-16 DIAGNOSIS — Z79899 Other long term (current) drug therapy: Secondary | ICD-10-CM | POA: Diagnosis not present

## 2022-08-16 DIAGNOSIS — Z7982 Long term (current) use of aspirin: Secondary | ICD-10-CM | POA: Insufficient documentation

## 2022-08-16 DIAGNOSIS — E039 Hypothyroidism, unspecified: Secondary | ICD-10-CM | POA: Diagnosis not present

## 2022-08-16 DIAGNOSIS — Z7984 Long term (current) use of oral hypoglycemic drugs: Secondary | ICD-10-CM | POA: Insufficient documentation

## 2022-08-16 DIAGNOSIS — I2510 Atherosclerotic heart disease of native coronary artery without angina pectoris: Secondary | ICD-10-CM | POA: Diagnosis not present

## 2022-08-16 DIAGNOSIS — R197 Diarrhea, unspecified: Secondary | ICD-10-CM | POA: Diagnosis not present

## 2022-08-16 DIAGNOSIS — K922 Gastrointestinal hemorrhage, unspecified: Secondary | ICD-10-CM | POA: Diagnosis not present

## 2022-08-16 DIAGNOSIS — N179 Acute kidney failure, unspecified: Secondary | ICD-10-CM | POA: Diagnosis not present

## 2022-08-16 DIAGNOSIS — N3289 Other specified disorders of bladder: Secondary | ICD-10-CM | POA: Diagnosis not present

## 2022-08-16 DIAGNOSIS — Z794 Long term (current) use of insulin: Secondary | ICD-10-CM | POA: Diagnosis not present

## 2022-08-16 DIAGNOSIS — K649 Unspecified hemorrhoids: Secondary | ICD-10-CM | POA: Diagnosis not present

## 2022-08-16 DIAGNOSIS — K644 Residual hemorrhoidal skin tags: Secondary | ICD-10-CM | POA: Diagnosis not present

## 2022-08-16 DIAGNOSIS — K529 Noninfective gastroenteritis and colitis, unspecified: Secondary | ICD-10-CM | POA: Diagnosis not present

## 2022-08-16 DIAGNOSIS — E119 Type 2 diabetes mellitus without complications: Secondary | ICD-10-CM | POA: Diagnosis not present

## 2022-08-16 LAB — COMPREHENSIVE METABOLIC PANEL
ALT: 10 U/L (ref 0–44)
AST: 10 U/L — ABNORMAL LOW (ref 15–41)
Albumin: 4.1 g/dL (ref 3.5–5.0)
Alkaline Phosphatase: 79 U/L (ref 38–126)
Anion gap: 14 (ref 5–15)
BUN: 23 mg/dL — ABNORMAL HIGH (ref 6–20)
CO2: 17 mmol/L — ABNORMAL LOW (ref 22–32)
Calcium: 9.9 mg/dL (ref 8.9–10.3)
Chloride: 101 mmol/L (ref 98–111)
Creatinine, Ser: 1.33 mg/dL — ABNORMAL HIGH (ref 0.61–1.24)
GFR, Estimated: >60 mL/min (ref >60)
Glucose, Bld: 158 mg/dL — ABNORMAL HIGH (ref 70–99)
Potassium: 3.8 mmol/L (ref 3.5–5.1)
Sodium: 132 mmol/L — ABNORMAL LOW (ref 135–145)
Total Bilirubin: 0.5 mg/dL (ref 0.3–1.2)
Total Protein: 8.2 g/dL — ABNORMAL HIGH (ref 6.5–8.1)

## 2022-08-16 LAB — DIFFERENTIAL
Abs Immature Granulocytes: 0.21 10*3/uL — ABNORMAL HIGH (ref 0.00–0.07)
Basophils Absolute: 0.2 10*3/uL — ABNORMAL HIGH (ref 0.0–0.1)
Basophils Relative: 1 %
Eosinophils Absolute: 0.7 10*3/uL — ABNORMAL HIGH (ref 0.0–0.5)
Eosinophils Relative: 3 %
Immature Granulocytes: 1 %
Lymphocytes Relative: 9 %
Lymphs Abs: 2.2 10*3/uL (ref 0.7–4.0)
Monocytes Absolute: 2.5 10*3/uL — ABNORMAL HIGH (ref 0.1–1.0)
Monocytes Relative: 10 %
Neutro Abs: 19.7 10*3/uL — ABNORMAL HIGH (ref 1.7–7.7)
Neutrophils Relative %: 76 %

## 2022-08-16 LAB — PROTIME-INR
INR: 1.1 (ref 0.8–1.2)
Prothrombin Time: 14.7 seconds (ref 11.4–15.2)

## 2022-08-16 LAB — URINALYSIS, W/ REFLEX TO CULTURE (INFECTION SUSPECTED)
Bacteria, UA: NONE SEEN
Bilirubin Urine: NEGATIVE
Glucose, UA: >1000 mg/dL — AB
Ketones, ur: 40 mg/dL — AB
Leukocytes,Ua: NEGATIVE
Nitrite: NEGATIVE
Protein, ur: 30 mg/dL — AB
Specific Gravity, Urine: >1.046 — ABNORMAL HIGH (ref 1.005–1.030)
pH: 6 (ref 5.0–8.0)

## 2022-08-16 LAB — CBC
HCT: 41.7 % (ref 39.0–52.0)
Hemoglobin: 13.5 g/dL (ref 13.0–17.0)
MCH: 27.4 pg (ref 26.0–34.0)
MCHC: 32.4 g/dL (ref 30.0–36.0)
MCV: 84.8 fL (ref 80.0–100.0)
Platelets: 512 10*3/uL — ABNORMAL HIGH (ref 150–400)
RBC: 4.92 MIL/uL (ref 4.22–5.81)
RDW: 13.2 % (ref 11.5–15.5)
WBC: 25.1 10*3/uL — ABNORMAL HIGH (ref 4.0–10.5)
nRBC: 0 % (ref 0.0–0.2)

## 2022-08-16 LAB — OCCULT BLOOD X 1 CARD TO LAB, STOOL: Fecal Occult Bld: POSITIVE — AB

## 2022-08-16 MED ORDER — AZITHROMYCIN 250 MG PO TABS: 500.0000 mg | ORAL_TABLET | Freq: Every day | ORAL | 0 refills | Status: DC

## 2022-08-16 MED ORDER — ONDANSETRON HCL 4 MG PO TABS: 4.0000 mg | ORAL_TABLET | Freq: Four times a day (QID) | ORAL | 0 refills | Status: AC

## 2022-08-16 MED ORDER — LACTATED RINGERS IV BOLUS
1000.0000 mL | Freq: Once | INTRAVENOUS | Status: AC
  Administered 2022-08-16: 1000 mL via INTRAVENOUS

## 2022-08-16 MED ORDER — IOHEXOL 300 MG/ML  SOLN
100.0000 mL | Freq: Once | INTRAMUSCULAR | Status: AC | PRN
  Administered 2022-08-16: 100 mL via INTRAVENOUS

## 2022-08-16 MED ORDER — AZITHROMYCIN 250 MG PO TABS
500.0000 mg | ORAL_TABLET | Freq: Once | ORAL | Status: AC
  Administered 2022-08-16: 500 mg via ORAL
  Filled 2022-08-16: qty 2

## 2022-08-16 MED ORDER — HYDROCORTISONE 1 % EX CREA: TOPICAL_CREAM | CUTANEOUS | 0 refills | Status: DC

## 2022-08-16 NOTE — Telephone Encounter (Signed)
I was not in clinic Friday. Please triage patient, see if he has continued sx.  If so, offer OV/eval.  Thanks.

## 2022-08-16 NOTE — ED Triage Notes (Signed)
Pt reports diarrhea for the past several weeks with rectal bleeding, recently started on Ozempic.

## 2022-08-16 NOTE — Discharge Instructions (Signed)
You were seen in the emergency department for your diarrhea.  He did appear mildly dehydrated and did have positive blood in your stools.  Your CAT scan showed that you have colitis and this is likely due to infection and we have given you an antibiotic.  We did send off stool studies and if these are positive for C. difficile or other infection you will receive a call.  You should make sure that you are drinking plenty of fluids and staying well-hydrated.  I have given you some hydrocortisone cream for your hemorrhoids.  You should follow-up with your primary doctor to have your symptoms and your kidney function rechecked and you can follow-up with the GI clinic as well due to the blood in your stools though this is likely related to the infection.  You should return to the emergency department if you are having worsening dehydration, significantly worsening pain, fevers despite the antibiotics or any other new or concerning symptoms.

## 2022-08-16 NOTE — ED Notes (Signed)
Pt aware of the need for a urine... Unable to currently collect.... 

## 2022-08-16 NOTE — ED Notes (Signed)
Pt aware of the need for a stool sample... Unable to currently provide sample.Marland KitchenMarland Kitchen

## 2022-08-16 NOTE — ED Provider Notes (Signed)
[place] EMERGENCY DEPARTMENT AT [facility] Provider Note   CSN: [id number] Arrival date & time: 08/16/22  1749     History  Chief Complaint  Patient presents with   Diarrhea    [name] is a 62 y.o. male.  Patient is a 62 year old male with a past medical history of hypothyroidism, diabetes, cirrhosis status post liver transplant, CAD presenting to the emergency department with diarrhea.  Patient states that he has had diarrhea ongoing for several weeks.  He states that he was started on Ozempic about 6 weeks ago and initially thought that his symptoms were related to the Ozempic.  He states it has been about 2 weeks since stopping the Ozempic and his diarrhea has continued.  He states that he has about 15 episodes of loose stools per day.  He denies any black stools but states in the last day he started to have bright red blood mixed in with his stool.  He denies any blood clots.  He states that he has also had increased hemorrhoid pain.  He states that it is some mild lower abdominal pain with some nausea but denies any vomiting.  He denies any fevers or chills.  He denies any recent antibiotic use or recent hospitalizations.  He is any recent travel or camping trip or recent known sick contacts.  The history is provided by the patient and the spouse.  Diarrhea      Home Medications Prior to Admission medications   Medication Sig Start Date End Date Taking? Authorizing Provider  azithromycin (ZITHROMAX) 250 MG tablet Take 2 tablets (500 mg total) by mouth daily for 5 days. Take first 2 tablets together, then 1 every day until finished. 08/16/22 08/21/22 Yes Elayne Snare K, DO  hydrocortisone cream 1 % Apply to affected area 2 times daily 08/16/22  Yes Theresia Lo, Turkey K, DO  ondansetron (ZOFRAN) 4 MG tablet Take 1 tablet (4 mg total) by mouth every 6 (six) hours. 08/16/22  Yes Elayne Snare K, DO  acetaminophen (TYLENOL) 500 MG tablet Take 500 mg by mouth  every 6 (six) hours as needed for moderate pain or headache.    [provider]  albuterol (VENTOLIN HFA) 108 (90 Base) MCG/ACT inhaler Inhale 2 puffs into the lungs every 6 (six) hours as needed for wheezing or shortness of breath.    [provider]  alendronate (FOSAMAX) 70 MG tablet Take 1 tablet (70 mg total) by mouth every Sunday. Take with a full glass of water on an empty stomach. 05/25/21   Joaquim Nam, MD  aspirin EC 325 MG EC tablet Take 1 tablet (325 mg total) by mouth daily. 04/08/21   Gold, Wayne E, PA-C  azelastine (ASTELIN) 0.1 % nasal spray PLACE 2 SPRAYS INTO BOTH NOSTRILS TWICE DAILY AS NEEDED 04/26/20   Joaquim Nam, MD  calcium carbonate (TUMS - DOSED IN MG ELEMENTAL CALCIUM) 500 MG chewable tablet Chew 1 tablet by mouth daily.    [provider]  Cholecalciferol (VITAMIN D) 50 MCG (2000 UT) tablet Take 2,000 Units by mouth daily.    [provider]  empagliflozin (JARDIANCE) 10 MG TABS tablet Take 1 tablet (10 mg total) by mouth daily before breakfast. 03/17/21   Joaquim Nam, MD  fluticasone [facility]) 50 MCG/ACT nasal spray PLACE ONE OR TWO SPRAYS INTO BOTH NOSTRILS DAILY AS NEEDED. 12/22/17   Joaquim Nam, MD  levothyroxine (SYNTHROID) 175 MCG tablet Take 1 tablet (175 mcg total) by mouth  daily before breakfast. 08/13/22   Pricilla Riffle, MD  metFORMIN (GLUCOPHAGE-XR) 500 MG 24 hr tablet Take 2 tablets (1,000 mg total) by mouth in the morning and at bedtime. 09/25/21   Joaquim Nam, MD  metoprolol tartrate (LOPRESSOR) 25 MG tablet Take 1 tablet (25 mg total) by mouth 2 (two) times daily. 05/21/22   Joaquim Nam, MD  metroNIDAZOLE (METROGEL) 1 % gel Apply 1 application topically daily as needed (rosacea).    [provider]  mycophenolate (CELLCEPT) 250 MG capsule Take 250 mg by mouth 2 (two) times daily. 06/10/20   [provider]  NON FORMULARY Pt uses a cpap nightly    [provider]  OneTouch Delica  Lancets 33G MISC USE TO CHECK SUGAR DAILY 11/01/18   Joaquim Nam, MD  [facility] ULTRA test strip USE TO CHECK SUGAR DAILY 04/26/19   Joaquim Nam, MD  pravastatin (PRAVACHOL) 40 MG tablet Take 40 mg by mouth every evening.    [provider]  Semaglutide,0.25 or 0.5MG /DOS, (OZEMPIC, 0.25 OR 0.5 MG/DOSE,) 2 MG/1.5ML SOPN Inject 0.25 mg into the skin once a week. 06/21/22   [provider]  sertraline (ZOLOFT) 50 MG tablet Take 1 tablet (50 mg total) by mouth daily. 09/25/21   Joaquim Nam, MD  tacrolimus (PROGRAF) 1 MG capsule Take 2 mg by mouth 2 (two) times daily. 08/01/20   [provider]  tiZANidine (ZANAFLEX) 2 MG tablet Take 1-2 tablets (2-4 mg total) by mouth 3 (three) times daily as needed for muscle spasms. 06/04/22   Karie Schwalbe, MD      Allergies    Watermelon flavor    Review of Systems   Review of Systems  Gastrointestinal:  Positive for diarrhea.    Physical Exam Updated Vital Signs BP 126/67 (BP Location: Right Arm)   Pulse 98   Temp 98.6 F (37 C) (Oral)   Resp 18   Ht 5\' 8"  (1.727 m)   Wt 81.6 kg   SpO2 100%   BMI 27.37 kg/m  Physical Exam Vitals and nursing note reviewed. Exam conducted with a chaperone present.  Constitutional:      General: He is not in acute distress.    Appearance: Normal appearance. He is ill-appearing.  HENT:     Head: Normocephalic and atraumatic.     Nose: Nose normal.     Mouth/Throat:     Mouth: Mucous membranes are dry.     Pharynx: Oropharynx is clear.  Eyes:     Extraocular Movements: Extraocular movements intact.     Conjunctiva/sclera: Conjunctivae normal.  Cardiovascular:     Rate and Rhythm: Normal rate and regular rhythm.     Heart sounds: Normal heart sounds.  Pulmonary:     Effort: Pulmonary effort is normal.     Breath sounds: Normal breath sounds.  Abdominal:     General: Abdomen is flat.     Palpations: Abdomen is soft.     Tenderness: There is abdominal tenderness (lower  abdomen). There is no right CVA tenderness, left CVA tenderness, guarding or rebound.  Genitourinary:    Comments: Non-thrombosed external hemorrhoid, no active bleeding, light brown stool in the vault Musculoskeletal:        General: Normal range of motion.     Cervical back: Normal range of motion and neck supple.  Skin:    General: Skin is warm and dry.  Neurological:     General: No focal deficit present.  Mental Status: He is alert and oriented to person, place, and time.  Psychiatric:        Mood and Affect: Mood normal.        Behavior: Behavior normal.     ED Results / Procedures / Treatments   Labs (all labs ordered are listed, but only abnormal results are displayed) Labs Reviewed  COMPREHENSIVE METABOLIC PANEL - Abnormal; Notable for the following components:      Result Value   Sodium 132 (*)    CO2 17 (*)    Glucose, Bld 158 (*)    BUN 23 (*)    Creatinine, Ser 1.33 (*)    Total Protein 8.2 (*)    AST 10 (*)    All other components within normal limits  CBC - Abnormal; Notable for the following components:   WBC 25.1 (*)    Platelets 512 (*)    All other components within normal limits  OCCULT BLOOD X 1 CARD TO LAB, STOOL - Abnormal; Notable for the following components:   Fecal Occult Bld POSITIVE (*)    All other components within normal limits  DIFFERENTIAL - Abnormal; Notable for the following components:   Neutro Abs 19.7 (*)    Monocytes Absolute 2.5 (*)    Eosinophils Absolute 0.7 (*)    Basophils Absolute 0.2 (*)    Abs Immature Granulocytes 0.21 (*)    All other components within normal limits  URINALYSIS, W/ REFLEX TO CULTURE (INFECTION SUSPECTED) - Abnormal; Notable for the following components:   Specific Gravity, Urine >1.046 (*)    Glucose, UA >1,000 (*)    Hgb urine dipstick SMALL (*)    Ketones, ur 40 (*)    Protein, ur 30 (*)    All other components within normal limits  GASTROINTESTINAL PANEL BY PCR, STOOL (REPLACES STOOL CULTURE)   C DIFFICILE QUICK SCREEN W PCR REFLEX    PROTIME-INR    EKG None  Radiology CT ABDOMEN PELVIS W CONTRAST  Result Date: 08/16/2022 CLINICAL DATA:  Diarrhea and rectal bleeding. EXAM: CT ABDOMEN AND PELVIS WITH CONTRAST TECHNIQUE: Multidetector CT imaging of the abdomen and pelvis was performed using the standard protocol following bolus administration of intravenous contrast. RADIATION DOSE REDUCTION: This exam was performed according to the departmental dose-optimization program which includes automated exposure control, adjustment of the mA and/or kV according to patient size and/or use of iterative reconstruction technique. CONTRAST:  OMNIPAQUE IOHEXOL 300 MG/ML  SOLN COMPARISON:  November 05, 2017 FINDINGS: Lower chest: No acute abnormality. An artificial aortic valve is seen. Hepatobiliary: No focal liver abnormality is seen. Status post cholecystectomy. No biliary dilatation. Pancreas: Unremarkable. No pancreatic ductal dilatation or surrounding inflammatory changes. Spleen: Normal in size without focal abnormality. Adrenals/Urinary Tract: Adrenal glands are unremarkable. Kidneys are normal, without renal calculi, focal lesion, or hydronephrosis. The urinary bladder is poorly distended and subsequently limited in evaluation. Mild to moderate severity, predominantly anterior urinary bladder wall thickening is seen. Stomach/Bowel: Stomach is within normal limits. The appendix is surgically absent. No evidence of bowel dilatation. A stable 1.6 cm x 0.6 cm x 2.5 cm area of soft tissue attenuation is seen within the wall of the proximal duodenum, along the site of the common bile duct ampullary (best seen on coronal reformatted images 56 through 58, CT series 5). Moderately thickened and inflamed descending and sigmoid colon is noted. Vascular/Lymphatic: No significant vascular findings are present. A mild periportal lymphadenopathy is seen. Reproductive: The prostate gland is mildly enlarged.  Other: No abdominal wall hernia or abnormality. No abdominopelvic ascites. Musculoskeletal: Multiple sternal wires are present. No acute osseous abnormalities are identified. IMPRESSION: 1. Moderate severity descending and sigmoid colitis. 2. Evidence of prior cholecystectomy and prior appendectomy. 3. Mild to moderate severity urinary bladder wall thickening, which may be secondary to chronic bladder outlet obstruction. Further evaluation with urinalysis is recommended, as sequelae associated with acute cystitis cannot be excluded. Electronically Signed   By: Aram Candela M.D.   On: 08/16/2022 20:16    Procedures Procedures    Medications Ordered in ED Medications  azithromycin (ZITHROMAX) tablet 500 mg (has no administration in time range)  lactated ringers bolus 1,000 mL (0 mLs Intravenous Stopped 08/16/22 1935)  iohexol (OMNIPAQUE) 300 MG/ML solution 100 mL (100 mLs Intravenous Contrast Given 08/16/22 1949)  lactated ringers bolus 1,000 mL (1,000 mLs Intravenous New Bag/Given 08/16/22 2234)    ED Course/ Medical Decision Making/ A&P Clinical Course as of 08/16/22 2320  Sun Aug 16, 2022  1921 Mild AKI and leukocytosis. Hemoccult positive, no anemia. CTAP and stool studies pending. [VK]  2028 CTAP with findings of colitis. In the setting of prolonged diarrhea with hemoccult positive stool will recommended antibiotics. Patient has not had any diarrhea yet since he's been here. [VK]  2249 C diff and stool studies will not result tonight, UA with mild ketones but no infection. No signs of DKA or starvation ketosis. [VK]    Clinical Course User Index [VK] Rexford Maus, DO                             Medical Decision Making This patient presents to the ED with chief complaint(s) of diarrhea with pertinent past medical history of DM, HTN, cirrhosis s/p liver transplant which further complicates the presenting complaint. The complaint involves an extensive differential diagnosis and  also carries with it a high risk of complications and morbidity.    The differential diagnosis includes dehydration, electrolyte abnormality, GI bleed, C. difficile or other infectious diarrhea, diverticulitis, other intra-abdominal infection  Additional history obtained: Additional history obtained from spouse Records reviewed Care Everywhere/External Records  ED Course and Reassessment: On patient's arrival to the emergency department he was hemodynamically stable, mildly ill and dry appearing though in no acute distress.  The patient was started on IV fluids and will have labs performed to evaluate for dehydration as well as Hemoccult to evaluate for GI bleed.  The patient does have lower abdominal tenderness and due to the progression of his symptoms we will for CT abdomen pelvis to further evaluate for intra-abdominal infection and will be closely reassessed.  Independent labs interpretation:  The following labs were independently interpreted: Close cytosis, AKI, mildly low bicarb  Independent visualization of imaging: - I independently visualized the following imaging with scope of interpretation limited to determining acute life threatening conditions related to emergency care: CT AP, which revealed colitis  Consultation: - Consulted or discussed management/test interpretation w/ external professional: N/A  Consideration for admission or further workup: Patient has no emergent conditions requiring admission or further work-up at this time and is stable for discharge home with primary care and GI follow-up  Social Determinants of health: N/A    Amount and/or Complexity of Data Reviewed Labs: ordered. Radiology: ordered.  Risk Prescription drug management.          Final Clinical Impression(s) / ED Diagnoses Final diagnoses:  Colitis  AKI (acute kidney injury) (HCC)  Gastrointestinal hemorrhage, unspecified gastrointestinal hemorrhage type  Hemorrhoids, unspecified  hemorrhoid type    Rx / DC Orders ED Discharge Orders          Ordered    azithromycin (ZITHROMAX) 250 MG tablet  Daily        08/16/22 2312    ondansetron (ZOFRAN) 4 MG tablet  Every 6 hours        08/16/22 2312    hydrocortisone cream 1 %        08/16/22 2312              Elayne Snare K, DO 08/16/22 2320

## 2022-08-17 LAB — GASTROINTESTINAL PANEL BY PCR, STOOL (REPLACES STOOL CULTURE)

## 2022-08-17 LAB — C DIFFICILE QUICK SCREEN W PCR REFLEX
C Diff antigen: NEGATIVE
C Diff interpretation: NOT DETECTED
C Diff toxin: NEGATIVE

## 2022-08-17 NOTE — Telephone Encounter (Signed)
I spoke with pt who was seen at [facility] ED on 08/16/22 and dx with colitis. Pt continues with watery diarrhea with some blood and abd soreness. Pt said does not know the name of med he is to pick up today at pharmacy. Looking at ED note pt given zpak,ondansetron and hydrocortisone cream. Pt scheduled ED FU with Dr Para March on 08/18/22 at 2 PM.pt will be at [facility] at 1:45 for ck in. Offered pt sooner appt but pt declined. UC &B+ ED precautions given and pt voiced understanding. Sending note to Dr Para March and Para March pool.

## 2022-08-17 NOTE — Telephone Encounter (Signed)
Noted, will see at OV tomorrow.  Thanks.

## 2022-08-18 ENCOUNTER — Encounter: Payer: Medicare Other | Admitting: Pharmacist

## 2022-08-18 ENCOUNTER — Ambulatory Visit (INDEPENDENT_AMBULATORY_CARE_PROVIDER_SITE_OTHER): Payer: Medicare Other | Admitting: Family Medicine

## 2022-08-18 ENCOUNTER — Encounter: Payer: Self-pay | Admitting: Family Medicine

## 2022-08-18 ENCOUNTER — Telehealth: Admit: 2022-08-18 | Discharge: 2022-08-18 | Payer: MEDICARE | Attending: Ambulatory Care | Primary: Ambulatory Care

## 2022-08-18 VITALS — BP 122/72 | HR 69 | Temp 97.9°F | Ht 68.0 in | Wt 178.0 lb

## 2022-08-18 DIAGNOSIS — E119 Type 2 diabetes mellitus without complications: Principal | ICD-10-CM

## 2022-08-18 DIAGNOSIS — K529 Noninfective gastroenteritis and colitis, unspecified: Secondary | ICD-10-CM

## 2022-08-18 MED ORDER — AZITHROMYCIN 250 MG PO TABS: 500.0000 mg | ORAL_TABLET | Freq: Every day | ORAL | Status: DC

## 2022-08-18 NOTE — Patient Instructions (Signed)
Finish azithromycin.  Go to the lab on the way out.   If you have mychart we'll likely use that to update you.    Take care.  Glad to see you. I'll update [facility].   If not better in 1-2 days, then let me know.

## 2022-08-18 NOTE — Unmapped (Signed)
[facility] at [facility]  [address]  [place], [place] [address]    Clinical Pharmacist Visit Summary    The patient reports they are physically located in [place] and is currently: at home. I conducted a audio/video visit. I spent  29m 31s on the video call with the patient. I spent an additional 10 minutes on pre- and post-visit activities on the date of service .      Assessment and Plan:   1. Diabetes, post-transplant: fair control.  Due for repeat A1c; last A1c = 7.4% on 04/06/2022 with goal <7% without hypoglycemia.   SMBG data unavailable, as patient does not check frequently. Self reports BG 100s - 160s without any BG <70 mg/dl since stopping Ozempic.   Takes metformin XR 1000 mg BID and Jardiance 10 mg daily with good adherence and without adverse effects or affordability concerns (gets branded medications via [facility]). Was taking Ozempic 0.25 mg weekly x 7 doses but self-discontinued GLP-1 RA on 08/01/2022 due to adverse effects. Was diagnosed with colitis on 6/23 and is on azithromycin day 3 of 5; has PCP follow up this afternoon and has been referred to GI. Denies regular physical activity; diet is mostly unchanged except now consists of smaller portions of fast foods.   With shared decision making, will continue Jardiance and metformin as prescribed and HOLD Ozempic until antibiotic course and GI workup is complete and attempt retrial. We discussed switching Ozempic to oral Rybelsus or daily Victoza via [employer] [facility]. We also discussed switching GLP-1 RA to DPP4i, though he would lose CV benefit which is less than ideal given his history of CAD. There is opportunity to titrate Jardiance, but the extent to which this would improve his glycemic control is likely minimal. Recommended that patient reduce intake of high fat, greasy foods that may be exacerbating his GI symptoms and possibility contributing to hyperglycemia.   HOLD Ozempic for now.   Continue metformin XR 1000 mg PO BID.  Continue Jardiance 10 mg PO daily.  Reviewed signs/symptoms/treatment of hypoglycemia.   Follow up with clinical pharmacist on 09/16/2022 to discuss retrial of GLP-1 RA.  Follow up with Dr. Beryle Flock MD on 8/26/202.   Repeat A1c today at PCP visit with Dr. Para March today.     Theodosia Paling, PharmD, CPP, BCACP, CDCES    I spent a total of  27  minutes face to face with the patient delivering clinical care and providing education/counseling.     Subjective:   Reason for visit: Follow up with Clinical Pharmacist Practitioner for blood glucose review and adjustment of diabetes regimen as needed.    Anthony Alvarado is a 62 y.o. year old male with a history of diabetes post-transplant who presents today for a diabetes-related visit. PMH includes liver transplant (2022), hepatic steatosis, obesity, hypothyroidism, and HTN, CAD s/p CABG (2023)    Diabetes Provider and Last Visit Date: Beatrix Shipper, CPP on 05/12/2022    At Last Visit:   Start Ozempic 0.25 mg subQ once weekly for 4 weeks, then increase to 0.5 mg weekly, if unable to tolerate please discontinue medication.  Continue metformin XR 1000 mg PO BID.  Continue Jardiance 10 mg PO daily.    Interval History of Present Illness:   08/10/22: Patient sent MyChart message stating Hi, i'am not tolerating the ozempic good. I have diarrea bad and it feels like I need to go to bathroom all the time. I stopped taking it sunday 6/08 and did not  take any yesterday. I have had no change since stopping. I have took at least 6 weeks worth. It's also time for my follow up. Patient was scheduled with CPP to follow up re: GI ADE    08/16/22: Seen in ED, diagnosed with colitis and prescribed azithromycin. Also had workup for C. Difficile, results pending.    Today: Patient presents virtually for diabetes follow up. He reports adherence to metformin XR 1000 mg BID and Jardiance 10 mg daily. Was taking Ozempic 0.25 mg weekly x 7 doses, but self-discontinued the medication on 08/01/22 due to severe diarrhea resulting in recent ED visit 2 days ago (though symptoms did not improve upon discontinuation, was having diarrhea prior to GLP-1 RA initiation). Denies missed doses of Jardiance; admits to intermittently stopping metformin for 1 to 2 days or skipping a dose to see if diarrhea would improve. Had 1 severe episode of diarrhea with sweating and dry heaving. He is currently on day 3 of 5 of azithromycin. States diarrhea appeared to improve yesterday evening, but symptoms returned to baseline today. Has a PCP visit this afternoon for follow up.     Denies affordability concerns (has [employer], getting Jardiance and Ozempic through [facility]). Checks BG using glucometer at home off and on. When he has checked, BG have been running in the 100s - 160s since stopping the Ozempic. Denies symptoms hypoglycemia or BG <70 mg/dl. Diet and exercise as documented below. Discussed the importance of medication adherence, diet, and exercise to improve glycemic control. He had no additional questions or concerns.    Diet (Typical):  Breakfast: Cereal  Lunch: Fast food (Hamburgers/fries), grilled cheese sandwich   Dinner: Vegetables (lima/pinto beans/corn/ turnip greens), spaghetti, brown rice, tacos, hotdogs  Snacks (evening): Pretzels, chips, chocolate chip cookies  Beverages: Water, diet Pepsi, regular Gatorade   08/18/22: Has cut way back on snacking; having small handful of pretzels - portion control.     Exercise: Denies regular physical activity, walking through ADL  08/18/22: Denies major changes.     Social History:   Tobacco use: no  Illicit drug use use: no  Alcohol use: no  Occupation: Unemployed  Household: Lives alone    POC glucose level today of N/A mg/dL - virtual visit.     Glucose Monitoring: Glucose Meter - Did not have BG data today.     Hypoglycemia:    Symptoms of hypoglycemia since last visit: no   If no recent hypoglycemia, prior recognition of hypoglycemia symptoms and knowledge of treatment: yes   Treats with:  n/a    Current Medications: Reports adherence to the following diabetes medications:   Ozempic 0.25 mg once weekly - stopped on 08/01/2022  Metformin XR 1000 mg BID   Jardiance 10 mg daily    CARDIOVASCULAR RISK REDUCTION  History of clinical ASCVD? yes, CAD s/p CABG 2023  History of heart failure? no  History of hyperlipidemia? yes  Taking statin? yes, pravastatin 40 mg daily  Taking aspirin? yes  Taking SGLT-2i and/or GLP- 1 RA? yes, Jardiance 10 mg daily; stopped Ozempic on 08/01/2022    BLOOD PRESSURE CONTROL  History of hypertension?: yes  Taking ACEi/ARB? no    KIDNEY CARE  History of Chronic Kidney Disease? no  History of albuminuria? no  Taking SGLT-2i and/or GLP- 1 RA? As above  Taking ACEi/ARB? As above      Current Outpatient Medications:     aspirin 325 MG tablet, Take 1 tablet (325 mg total) by mouth daily.,  Disp: , Rfl:     azelastine (ASTELIN) 137 mcg (0.1 %) nasal spray, 1 spray into each nostril daily as needed., Disp: , Rfl:     blood sugar diagnostic (ONETOUCH ULTRA TEST) Strp, USE TO CHECK SUGAR DAILY, Disp: , Rfl:     calcium carbonate (TUMS) 200 mg calcium (500 mg) chewable tablet, Chew 1 tablet (200 mg of elem calcium total) daily. Pt reports taking every other day., Disp: , Rfl:     FEROCON 110-0.5 mg capsule, TAKE 1 CAPSULE BY MOUTH 2 (TWO) TIMES DAILY AFTER A MEAL. NOT COVERED BY INSURANCE., Disp: , Rfl:     fluticasone propionate (FLONASE) 50 mcg/actuation nasal spray, PLACE ONE OR TWO SPRAYS INTO BOTH NOSTRILS DAILY AS NEEDED., Disp: , Rfl:     JARDIANCE 10 mg tablet, Pt reports that he has not taken this medication in about 1 month due to the price., Disp: , Rfl:     levothyroxine (SYNTHROID) 175 MCG tablet, Take 1 tablet (175 mcg total) by mouth daily before breakfast., Disp: 90 tablet, Rfl: 1    metFORMIN (GLUCOPHAGE-XR) 500 MG 24 hr tablet, Take 2 tablets (1,000 mg total) by mouth every morning AND 2 tablets (1,000 mg total) at bedtime., Disp: 360 tablet, Rfl: 3    metoprolol tartrate (LOPRESSOR) 25 MG tablet, Take 1 tablet (25 mg total) by mouth in the morning., Disp: , Rfl:     metoPROLOL tartrate (LOPRESSOR) 25 MG tablet, Take 1 tablet (25 mg total) by mouth two (2) times a day., Disp: 180 tablet, Rfl: 0    mycophenolate (CELLCEPT) 250 mg capsule, Take 1 capsule (250 mg total) by mouth Two (2) times a day., Disp: 180 capsule, Rfl: 3    pravastatin (PRAVACHOL) 40 MG tablet, Take 1 tablet (40 mg total) by mouth every evening., Disp: 90 tablet, Rfl: 0    semaglutide (OZEMPIC) 0.25 mg or 0.5 mg (2 mg/3 mL) PnIj, Start with 0.25 mg weekly for 4 weeks. If feeling well, then increase to 0.5 mg weekly., Disp: 3 mL, Rfl: 11    sertraline (ZOLOFT) 50 MG tablet, Take 1 tablet (50 mg total) by mouth daily., Disp: 90 tablet, Rfl: 3    tacrolimus (PROGRAF) 1 MG capsule, Take 2 capsules (2 mg total) by mouth two (2) times a day., Disp: 120 capsule, Rfl: 11  No current facility-administered medications for this visit.    Facility-Administered Medications Ordered in Other Visits:     diazePAM (VALIUM) 5 mg/mL injection, , , ,     Objective:   Vitals:    There were no vitals filed for this visit.      Past Medical History:    Active Ambulatory Problems     Diagnosis Date Noted    Diabetes mellitus without complication (CMS-HCC) 05/09/2017    Hypothyroidism 08/09/2006    Cirrhosis of liver without ascites (CMS-HCC) 01/10/2018    OSA (obstructive sleep apnea) 09/14/2011    Essential hypertension 08/09/2006    Encounter for pre-transplant evaluation for chronic liver disease 03/10/2018    Pleural effusion 09/09/2018    Pneumothorax after biopsy 09/11/2018    Liver transplant recipient (CMS-HCC) 09/16/2018    Allergic rhinitis 08/09/2006    Arthralgia 10/25/2017    Asthma 09/14/2011    Bicuspid aortic valve 02/25/2011    Flying phobia 09/18/2013    Mucosal abnormality of stomach 09/27/2018    Nephrolithiasis 07/28/2018    Pure hypercholesterolemia 02/14/2007    Umbilical hernia 08/14/2012    Unspecified hearing loss  08/11/2006    COVID-19 03/16/2019     Resolved Ambulatory Problems     Diagnosis Date Noted    No Resolved Ambulatory Problems     Past Medical History:   Diagnosis Date    Aortic valve stenosis     Autoimmune cholangitis     Carrier of hemochromatosis HFE gene mutation     Cholestatic cirrhosis (CMS-HCC)     Hepatic encephalopathy (CMS-HCC)     Hypertension     Other osteoporosis without current pathological fracture     Sleep apnea     Type 2 diabetes mellitus (CMS-HCC)        Wt Readings from Last 3 Encounters:   05/12/22 93.2 kg (205 lb 6.4 oz)   04/06/22 93.9 kg (207 lb)   08/28/21 92.4 kg (203 lb 9.6 oz)       Lab Results   Component Value Date    A1C 7.4 (H) 04/06/2022    A1C 6.6 (H) 09/18/2019    A1C 6.0 (H) 04/17/2019    A1C 5.9 (H) 12/19/2018    A1C 4.7 (L) 10/24/2018    A1C 4.4 (L) 09/15/2018    A1C 5.1 01/04/2018       Lab Results   Component Value Date    NA 137 07/14/2022    K 5.0 07/14/2022    CL 101 07/14/2022    CO2 20 07/14/2022    BUN 21 07/14/2022    CREATININE 1.14 07/14/2022    GLU 140 (H) 08/28/2021    CALCIUM 10.3 (H) 07/14/2022    ALBUMIN 4.2 08/28/2021    PHOS 3.3 08/28/2021       Lab Results   Component Value Date    ALKPHOS 117 07/14/2022    BILITOT <0.2 07/14/2022    BILIDIR <0.10 07/14/2022    PROT 7.4 07/14/2022    ALBUMIN 4.2 08/28/2021    ALT 32 07/14/2022    AST 22 07/14/2022       No results found for: [facility]    Lab Results   Component Value Date    CHOL 122 04/17/2019    CHOL 114 12/19/2018     Lab Results   Component Value Date    HDL 33 (L) 04/17/2019    HDL 33 (L) 12/19/2018     Lab Results   Component Value Date    LDL 52 (L) 04/17/2019    LDL 32 (L) 12/19/2018    LDL 79.6 03/21/2018     Lab Results   Component Value Date    VLDL 37 04/17/2019    VLDL 18.8 (H) 12/19/2018     Lab Results   Component Value Date    CHOLHDLRATIO 3.7 04/17/2019    CHOLHDLRATIO 3.5 12/19/2018     Lab Results   Component Value Date    TRIG 185 (H) 04/17/2019    TRIG 243 (H) 12/19/2018       The 10-year ASCVD risk score (Arnett DK, et al., 2019) is: 26.8%    Values used to calculate the score:      Age: 30 years      Sex: Male      Is Non-Hispanic African American: No      Diabetic: Yes      Tobacco smoker: No      Systolic Blood Pressure: 172 mmHg      Is BP treated: Yes      HDL Cholesterol: 28 mg/dL      Total Cholesterol: 113 mg/dL  Note: For patients with SBP <90 or >200, Total Cholesterol <130 or >320, HDL <20 or >100 which are outside of the allowable range, the calculator will use these upper or lower values to calculate the patient???s risk score.

## 2022-08-18 NOTE — Progress Notes (Unsigned)
ER f/u for colitis.  D/w pt about recent events and imaging.   IMPRESSION: 1. Moderate severity descending and sigmoid colitis. 2. Evidence of prior cholecystectomy and prior appendectomy. 3. Mild to moderate severity urinary bladder wall thickening, which may be secondary to chronic bladder outlet obstruction. Further evaluation with urinalysis is recommended, as sequelae associated with acute cystitis cannot be excluded.   Prev u/a d/w pt.    He had GI upset with ozempic and had to stop it but that may have been a separate issue.  D/w pt. discussed that I would not expect him to have imaging documented colitis from Ozempic, especially since he had stopped the medication previously.  BM was more normal recently.  Prev blood in stool but not now.  No fevers.  Still feels weak in general.  Gradually returning to solid food. Taking fluids well.  No nausea now.   L lower quadrant still slightly sore.   Meds, vitals, and allergies reviewed.   ROS: Per HPI unless specifically indicated in ROS section   GEN: nad, alert and oriented HEENT: ncat NECK: supple w/o LA CV: rrr. PULM: ctab, no inc wob ABD: soft, +bs,   L lower quadrant still slightly sore.  No rebound. EXT: no edema SKIN: no acute rash, no jaundice.  28 minutes were devoted to patient care in this encounter (this includes time spent reviewing the patient's file/history, interviewing and examining the patient, counseling/reviewing plan with patient).

## 2022-08-19 ENCOUNTER — Other Ambulatory Visit: Payer: Self-pay | Admitting: Family Medicine

## 2022-08-19 ENCOUNTER — Telehealth: Payer: Self-pay

## 2022-08-19 DIAGNOSIS — K5010 Crohn's disease of large intestine without complications: Secondary | ICD-10-CM | POA: Insufficient documentation

## 2022-08-19 DIAGNOSIS — K529 Noninfective gastroenteritis and colitis, unspecified: Secondary | ICD-10-CM | POA: Insufficient documentation

## 2022-08-19 LAB — CBC WITH DIFFERENTIAL/PLATELET
Basophils Absolute: 0.1 10*3/uL (ref 0.0–0.1)
Basophils Relative: 0.7 % (ref 0.0–3.0)
Eosinophils Absolute: 0.5 10*3/uL (ref 0.0–0.7)
Eosinophils Relative: 2.6 % (ref 0.0–5.0)
HCT: 39.5 % (ref 39.0–52.0)
Hemoglobin: 12.6 g/dL — ABNORMAL LOW (ref 13.0–17.0)
Lymphocytes Relative: 8.8 % — ABNORMAL LOW (ref 12.0–46.0)
Lymphs Abs: 1.6 10*3/uL (ref 0.7–4.0)
MCHC: 31.9 g/dL (ref 30.0–36.0)
MCV: 84.3 fl (ref 78.0–100.0)
Monocytes Absolute: 1.3 10*3/uL — ABNORMAL HIGH (ref 0.1–1.0)
Monocytes Relative: 7.3 % (ref 3.0–12.0)
Neutro Abs: 14.7 10*3/uL — ABNORMAL HIGH (ref 1.4–7.7)
Neutrophils Relative %: 80.6 % — ABNORMAL HIGH (ref 43.0–77.0)
Platelets: 502 10*3/uL — ABNORMAL HIGH (ref 150.0–400.0)
RBC: 4.69 Mil/uL (ref 4.22–5.81)
RDW: 14.1 % (ref 11.5–15.5)
WBC: 18.2 10*3/uL (ref 4.0–10.5)

## 2022-08-19 LAB — COMPREHENSIVE METABOLIC PANEL
ALT: 11 U/L (ref 0–53)
AST: 10 U/L (ref 0–37)
Albumin: 3.7 g/dL (ref 3.5–5.2)
Alkaline Phosphatase: 80 U/L (ref 39–117)
BUN: 21 mg/dL (ref 6–23)
CO2: 22 mEq/L (ref 19–32)
Calcium: 9.6 mg/dL (ref 8.4–10.5)
Chloride: 99 mEq/L (ref 96–112)
Creatinine, Ser: 1.23 mg/dL (ref 0.40–1.50)
GFR: 63.94 mL/min (ref >60.00)
Glucose, Bld: 131 mg/dL — ABNORMAL HIGH (ref 70–99)
Potassium: 3.7 mEq/L (ref 3.5–5.1)
Sodium: 134 mEq/L — ABNORMAL LOW (ref 135–145)
Total Bilirubin: 0.4 mg/dL (ref 0.2–1.2)
Total Protein: 7.2 g/dL (ref 6.0–8.3)

## 2022-08-19 LAB — HEMOGLOBIN A1C: Hgb A1c MFr Bld: 7.6 % — ABNORMAL HIGH (ref 4.6–6.5)

## 2022-08-19 NOTE — Assessment & Plan Note (Signed)
Due for follow-up A1c.  See notes on labs.

## 2022-08-19 NOTE — Assessment & Plan Note (Signed)
His left lower quadrant is still slightly sore but he has bowel movements had improved in the meantime and is not passing blood now.  No fevers.  Already on Zithromax.  Would continue Zithromax for now.  See notes on labs.  Will update [facility] transplant clinic and see about GI evaluation.

## 2022-08-19 NOTE — Telephone Encounter (Signed)
This has improved from prior and I will address it.  Thanks.

## 2022-08-19 NOTE — Addendum Note (Signed)
Addended by: Shon Millet on: 08/19/2022 04:45 PM   Modules accepted: Orders

## 2022-08-19 NOTE — Telephone Encounter (Signed)
Clydie Braun with [facility] lab called critical lab report for WBC 18.2 sending to Dr Para March who is out of office, Dr  Mosco Agar who is in office and Shanda Bumps CMA. I will teams Shanda Bumps also. Recorded in lab notebook also.

## 2022-08-20 NOTE — Unmapped (Signed)
Received VM from patient's pcp office (phone: [phone]) notifying us that patient was recently diagnosed with Colitis and will be treated with antibiotics.      Will route to provider and Transplant Coordinator.

## 2022-08-21 ENCOUNTER — Encounter: Payer: Self-pay | Admitting: *Deleted

## 2022-08-21 ENCOUNTER — Other Ambulatory Visit: Payer: Self-pay | Admitting: Family Medicine

## 2022-08-21 DIAGNOSIS — E789 Disorder of lipoprotein metabolism, unspecified: Principal | ICD-10-CM

## 2022-08-21 DIAGNOSIS — Z8719 Personal history of other diseases of the digestive system: Principal | ICD-10-CM

## 2022-08-21 DIAGNOSIS — Z944 Liver transplant status: Principal | ICD-10-CM

## 2022-08-21 MED ORDER — CIPROFLOXACIN HCL 500 MG PO TABS: 500.0000 mg | ORAL_TABLET | Freq: Two times a day (BID) | ORAL | 0 refills | Status: AC

## 2022-08-21 MED ORDER — CIPROFLOXACIN HCL 500 MG PO TABS: 500.0000 mg | ORAL_TABLET | Freq: Two times a day (BID) | ORAL | 0 refills | Status: DC

## 2022-08-21 MED ORDER — PRAVASTATIN 40 MG TABLET
ORAL_TABLET | Freq: Every evening | ORAL | 0 refills | 90 days
Start: 2022-08-21 — End: ?

## 2022-08-21 MED ORDER — SERTRALINE 50 MG TABLET
ORAL_TABLET | Freq: Every day | ORAL | 3 refills | 90 days
Start: 2022-08-21 — End: ?

## 2022-08-21 MED ORDER — METOPROLOL TARTRATE 25 MG TABLET
ORAL_TABLET | Freq: Two times a day (BID) | ORAL | 0 refills | 90.00000 days
Start: 2022-08-21 — End: ?

## 2022-08-21 MED ORDER — METFORMIN ER 500 MG TABLET,EXTENDED RELEASE 24 HR
ORAL_TABLET | Freq: Two times a day (BID) | ORAL | 3 refills | 90.00000 days
Start: 2022-08-21 — End: ?

## 2022-08-21 NOTE — Unmapped (Signed)
PLease forward to PCP

## 2022-08-21 NOTE — Unmapped (Signed)
The patient is requesting a medication refill

## 2022-08-21 NOTE — Unmapped (Signed)
I was immediately available via phone/pager or present on site.  I reviewed and discussed the case with the fellow, but did not see the patient.  I agree with the assessment and plan as documented in the fellow's note. Anieya Helman Jin Rashaan Wyles, MD

## 2022-08-21 NOTE — Unmapped (Signed)
[facility] Clinical Intervention    Type of intervention: Narrow therapeutic index drug    Medication involved: Tacrolimus    Problem identified: Pt has been taking accord tacrolimus and we only stock sandoz.    Intervention performed:   Spoke to patient about changing their accord tacrolimus to Sandoz.  Told the patient to finish the old mfg then start the new one.  Told patient to give the clinic a call to schedule labs when they start the new sandoz brand.  Pt acknowledged understanding.      Follow-up needed: Pt will call the clinic and schedule labs when starting the new mfg.    Approximate time spent: 5-10 minutes    Clinical evidence used to support intervention: FDA Orange Book    Result of the intervention: Prevention of an adverse drug event    Ilham Roughton C Gomer France, RPH   [facility] Specialty Pharmacist

## 2022-08-21 NOTE — Unmapped (Addendum)
Spoke to patient about changing their accord tacrolimus to Sandoz.  Told the patient to finish the old mfg then start the new one.  Told patient to give the clinic a call to schedule labs when they start the new sandoz brand.  Pt acknowledged understanding. dcr          [facility] Refill Coordination Note    Specialty Medication(s) to be Shipped:   Transplant: tacrolimus 1mg     Other medication(s) to be shipped:  Sertraline, Metoprolol, pravastatin,metformin     Anthony Alvarado, DOB: [birth year]  Phone: [phone] (home)       All above HIPAA information was verified with patient.     Was a Nurse, learning disability used for this call? No    Completed refill call assessment today to schedule patient's medication shipment from the [facility] [phone]).  All relevant notes have been reviewed.     Specialty medication(s) and dose(s) confirmed: Regimen is correct and unchanged.   Changes to medications: [name] reports stopping the following medications: Ozempic   Changes to insurance: No  New side effects reported not previously addressed with a pharmacist or physician: None reported  Questions for the pharmacist: No    Confirmed patient received a Conservation officer, historic buildings and a Surveyor, mining with first shipment. The patient will receive a drug information handout for each medication shipped and additional FDA Medication Guides as required.       DISEASE/MEDICATION-SPECIFIC INFORMATION        N/A    SPECIALTY MEDICATION ADHERENCE     Medication Adherence    Patient reported X missed doses in the last month: 0  Specialty Medication: tacrolimus 1 MG capsule (PROGRAF)  Patient is on additional specialty medications: No              Were doses missed due to medication being on hold? No      tacrolimus 1 MG capsule (PROGRAF): 7 days of medicine on hand       REFERRAL TO PHARMACIST     Referral to the pharmacist: Not needed      [facility]     Shipping address confirmed in Epic. Delivery Scheduled: Yes, Expected medication delivery date: 08/25/2022.     Medication will be delivered via UPS to the prescription address in Epic [place].    Mirinda Monte J Helane Gunther   [facility] Specialty Technician

## 2022-08-21 NOTE — Progress Notes (Signed)
Please call pt. If making improvement, then finish azithromycin as planned. If not improving, then change to cipro.  If he starts cipro, he must not take tizanidine.  Let me know if that is not helping.  Thanks.

## 2022-08-21 NOTE — Progress Notes (Signed)
Called patient reviewed all information and repeated back to me. Will call if any questions.  Patient symptoms have had no improvement. Will d/c the azithromycin and start on cipro will hold the Zanaflex.states he is not talking at this time.

## 2022-08-21 NOTE — Progress Notes (Signed)
Noted. Thanks.

## 2022-08-24 ENCOUNTER — Other Ambulatory Visit (INDEPENDENT_AMBULATORY_CARE_PROVIDER_SITE_OTHER): Payer: Medicare Other

## 2022-08-24 DIAGNOSIS — K529 Noninfective gastroenteritis and colitis, unspecified: Secondary | ICD-10-CM | POA: Diagnosis not present

## 2022-08-24 LAB — COMPREHENSIVE METABOLIC PANEL
ALT: 11 U/L (ref 0–53)
AST: 11 U/L (ref 0–37)
Albumin: 3.5 g/dL (ref 3.5–5.2)
Alkaline Phosphatase: 77 U/L (ref 39–117)
BUN: 24 mg/dL — ABNORMAL HIGH (ref 6–23)
CO2: 20 mEq/L (ref 19–32)
Calcium: 9.4 mg/dL (ref 8.4–10.5)
Chloride: 99 mEq/L (ref 96–112)
Creatinine, Ser: 1.34 mg/dL (ref 0.40–1.50)
GFR: 57.69 mL/min — ABNORMAL LOW (ref >60.00)
Glucose, Bld: 148 mg/dL — ABNORMAL HIGH (ref 70–99)
Potassium: 3.3 mEq/L — ABNORMAL LOW (ref 3.5–5.1)
Sodium: 131 mEq/L — ABNORMAL LOW (ref 135–145)
Total Bilirubin: 0.4 mg/dL (ref 0.2–1.2)
Total Protein: 7.6 g/dL (ref 6.0–8.3)

## 2022-08-24 LAB — CBC WITH DIFFERENTIAL/PLATELET
Basophils Absolute: 0.1 10*3/uL (ref 0.0–0.1)
Basophils Relative: 0.6 % (ref 0.0–3.0)
Eosinophils Absolute: 0.5 10*3/uL (ref 0.0–0.7)
Eosinophils Relative: 3.3 % (ref 0.0–5.0)
HCT: 38.5 % — ABNORMAL LOW (ref 39.0–52.0)
Hemoglobin: 12.2 g/dL — ABNORMAL LOW (ref 13.0–17.0)
Lymphocytes Relative: 9.7 % — ABNORMAL LOW (ref 12.0–46.0)
Lymphs Abs: 1.4 10*3/uL (ref 0.7–4.0)
MCHC: 31.7 g/dL (ref 30.0–36.0)
MCV: 83.5 fl (ref 78.0–100.0)
Monocytes Absolute: 1.9 10*3/uL — ABNORMAL HIGH (ref 0.1–1.0)
Monocytes Relative: 13.3 % — ABNORMAL HIGH (ref 3.0–12.0)
Neutro Abs: 10.2 10*3/uL — ABNORMAL HIGH (ref 1.4–7.7)
Neutrophils Relative %: 73.1 % (ref 43.0–77.0)
Platelets: 503 10*3/uL — ABNORMAL HIGH (ref 150.0–400.0)
RBC: 4.61 Mil/uL (ref 4.22–5.81)
RDW: 14.7 % (ref 11.5–15.5)
WBC: 14 10*3/uL — ABNORMAL HIGH (ref 4.0–10.5)

## 2022-08-24 MED FILL — METFORMIN ER 500 MG TABLET,EXTENDED RELEASE 24 HR: ORAL | 90 days supply | Qty: 360 | Fill #0

## 2022-08-24 MED FILL — METOPROLOL TARTRATE 25 MG TABLET: ORAL | 90 days supply | Qty: 180 | Fill #0

## 2022-08-24 MED FILL — TACROLIMUS 1 MG CAPSULE, IMMEDIATE-RELEASE: ORAL | 30 days supply | Qty: 120 | Fill #2

## 2022-08-24 MED FILL — SERTRALINE 50 MG TABLET: ORAL | 90 days supply | Qty: 90 | Fill #0

## 2022-08-25 ENCOUNTER — Encounter: Payer: Self-pay | Admitting: Family Medicine

## 2022-08-27 NOTE — Telephone Encounter (Signed)
Please see if his GI appointment at [facility] can get moved sooner. Please let me know.  Thanks.

## 2022-09-02 DIAGNOSIS — Z944 Liver transplant status: Principal | ICD-10-CM

## 2022-09-02 DIAGNOSIS — K921 Melena: Principal | ICD-10-CM

## 2022-09-02 DIAGNOSIS — E612 Magnesium deficiency: Principal | ICD-10-CM

## 2022-09-02 DIAGNOSIS — D849 Immunodeficiency, unspecified: Principal | ICD-10-CM

## 2022-09-02 DIAGNOSIS — C220 Liver cell carcinoma: Principal | ICD-10-CM

## 2022-09-02 DIAGNOSIS — R197 Diarrhea, unspecified: Principal | ICD-10-CM

## 2022-09-02 DIAGNOSIS — Z5181 Encounter for therapeutic drug level monitoring: Principal | ICD-10-CM

## 2022-09-02 LAB — CBC W/ DIFFERENTIAL
BASOPHILS ABSOLUTE COUNT: 0.2 10*9/L — AB
BASOPHILS ABSOLUTE COUNT: 0.1 10*9/L
BASOPHILS RELATIVE PERCENT: 1 %
BASOPHILS RELATIVE PERCENT: 0.6 %
EOSINOPHILS ABSOLUTE COUNT: 0.7 10*9/L — AB
EOSINOPHILS ABSOLUTE COUNT: 0.5 10*9/L
EOSINOPHILS RELATIVE PERCENT: 3 %
EOSINOPHILS RELATIVE PERCENT: 3.3 %
HEMATOCRIT: 41.7 %
HEMATOCRIT: 38.5 % — AB
HEMOGLOBIN: 13.5 g/dL
HEMOGLOBIN: 12.2 g/dL — AB
LYMPHOCYTES ABSOLUTE COUNT: 2.2 10*9/L
LYMPHOCYTES ABSOLUTE COUNT: 1.4 10*9/L
LYMPHOCYTES RELATIVE PERCENT: 9 %
LYMPHOCYTES RELATIVE PERCENT: 9.7 % — AB
MEAN CORPUSCULAR HEMOGLOBIN CONC: 32.4 g/dL
MEAN CORPUSCULAR HEMOGLOBIN CONC: 31.7 g/dL
MEAN CORPUSCULAR HEMOGLOBIN: 27.4 pg
MEAN CORPUSCULAR VOLUME: 84.8 fL
MEAN CORPUSCULAR VOLUME: 83.5 fL
MONOCYTES ABSOLUTE COUNT: 2.5 10*9/L — AB
MONOCYTES ABSOLUTE COUNT: 1.9 10*9/L — AB
MONOCYTES RELATIVE PERCENT: 10 %
MONOCYTES RELATIVE PERCENT: 13.3 % — AB
NEUTROPHILS ABSOLUTE COUNT: 19.7 10*9/L — AB
NEUTROPHILS ABSOLUTE COUNT: 10.2 10*9/L — AB
NEUTROPHILS RELATIVE PERCENT: 76 %
NEUTROPHILS RELATIVE PERCENT: 73.1 %
PLATELET COUNT: 512 10*9/L — AB
PLATELET COUNT: 503 10*9/L — AB
RED BLOOD CELL COUNT: 4.92 10*12/L
RED BLOOD CELL COUNT: 4.61 10*12/L
RED CELL DISTRIBUTION WIDTH: 13.2 %
RED CELL DISTRIBUTION WIDTH: 14.7 %
WHITE BLOOD CELL COUNT: 25.1 10*9/L — AB
WHITE BLOOD CELL COUNT: 14 10*9/L — AB

## 2022-09-02 LAB — COMPREHENSIVE METABOLIC PANEL
ALKALINE PHOSPHATASE: 79 U/L
ALKALINE PHOSPHATASE: 77 U/L
ALT (SGPT): 10 U/L
ALT (SGPT): 11 U/L
AST (SGOT): 10 U/L — AB
AST (SGOT): 11 U/L
BILIRUBIN TOTAL: 0.5 mg/dL
BILIRUBIN TOTAL: 0.4 mg/dL
BLOOD UREA NITROGEN: 23 mg/dL — AB
BLOOD UREA NITROGEN: 24 mg/dL — AB
CALCIUM: 9.9 mg/dL
CALCIUM: 9.4 mg/dL
CHLORIDE: 101 mmol/L
CHLORIDE: 99 mmol/L
CO2: 17 mmol/L — AB
CO2: 20 mmol/L
CREATININE: 1.33 mg/dL — AB
CREATININE: 1.34 mg/dL
EGFR CKD-EPI NON-AA MALE: 58 mL/min/{1.73_m2} — AB
GLUCOSE RANDOM: 158 mg/dL — AB
GLUCOSE RANDOM: 148 mg/dL — AB
POTASSIUM: 3.8 mmol/L
POTASSIUM: 3.3 mmol/L — AB
PROTEIN TOTAL: 8.2 g/dL — AB
PROTEIN TOTAL: 7.6 g/dL
SODIUM: 132 mmol/L — AB
SODIUM: 131 mmol/L — AB

## 2022-09-02 LAB — URINALYSIS WITH MICROSCOPY
BILIRUBIN UA: NEGATIVE
KETONES UA: 40 — AB
LEUKOCYTE ESTERASE UA: NEGATIVE
NITRITE UA: NEGATIVE
PH UA: 6 (ref 5.0–8.0)
PROTEIN UA: 30 — AB

## 2022-09-02 LAB — ALBUMIN
ALBUMIN: 4.1 g/dL
ALBUMIN: 3.5 g/dL

## 2022-09-02 LAB — PROTIME-INR
INR: 1.1
PROTIME: 14.7 s

## 2022-09-02 NOTE — Unmapped (Signed)
Patient sent msg to [facility] via MyChart, reporting that he had been having issues with diarrhea and hemorrhoids. Reached patient today. He reports that he was on ozempic for weight loss/glucose and started having diarrhea very soon thereafter. He took 7 doses, thinking it would improve, but it did not. He lost >35 lbs, now reportedly weighing 168lbs. He was seen in the ED on 6/23, completing stool testing, which was negative, but since his WBC was elevated, he was prescribed 250mg  zithromax x5 days. Stool tested positive for blood. He was next prescribed cipro 500mg  x 3 days on 6/29.  Repeat labs on 7/1 showed continued elevation in WBC. He reports diarrhea is frequent and of varying qty and somewhat transitional at times. Reinforced the importance of updating TNC in the future, when diarrhea continues for >2 days. Let him know that he is due for a colonoscopy this year. He secured a GI appt, but could not find one sooner that Nov. Encouraged him to use a rinse bottle for hygiene after BMs and to apply preparation H and A&D ointment to coat the anal area for protection.Reinforced that his txp provider would be consulted for recommendations.    Discussed with NP Harms and Dr. Sherryll Burger, who recommended patient repeat txp labs and stool testing to include fecal elastase and CMV, stop metformin, in case that is contributing to it, and avoid imodium for now. Colonoscopy ordered and Dr.Shah recommended it be completed as soon as possible. NP Harms msg'd GI pool to expedite scheduling.    Notified patient of all recommendations. He verbalized understanding.

## 2022-09-02 NOTE — Unmapped (Signed)
Colonoscopy  Procedure #1     Procedure #2   [id number]  MRN   GENERIC  Endoscopist     Is the patient's health insurance [employer], [facility]), or [employer]?   TRUE  Urgent procedure     Are you pregnant?     Are you in the process of scheduling or awaiting results of a heart ultrasound, stress test, or catheterization to evaluate new or worsening chest pain, dizziness, or shortness of breath?     Do you take: Plavix (clopidogrel), Coumadin (warfarin), Lovenox (enoxaparin), Pradaxa (dabigatran), Effient (prasugrel), Xarelto (rivaroxaban), Eliquis (apixaban), Pletal (cilostazol), or Brilinta (ticagrelor)?          Which of the above medications are you taking?          What is the name of the medical practice that manages this medication?          What is the name of the medical provider who manages this medication?     Do you have hemophilia, von Willebrand disease, or low platelets?     Do you have a pacemaker or implanted cardiac defibrillator?     Has a [facility] provider specified the location(s)?     Which location(s) did the [facility] provider specify?        [facility]        [place]        [facility]        [facility]     Is procedure indication for variceal banding (this does NOT include variceal screening)?     Have you had a heart attack, stroke or heart stent placement within the past 6 months?     Month of event     Year of event (ONLY ENTER LAST 2 DIGITS)        5  Height (feet)   9  Height (inches)   169  Weight (pounds)   25.0  BMI          Did the ordering provider specify a bowel prep?          What bowel prep was specified?     Do you have chronic kidney disease?     Do you have chronic constipation or have you had poor quality bowel preps for past colonoscopies?     Do you have Crohn's disease or ulcerative colitis?     Have you had weight loss surgery?          When you walk around your house or grocery store, do you have to stop and rest due to shortness of breath, chest pain, or light-headedness?     Do you ever use supplemental oxygen?     Have you been hospitalized for cirrhosis of the liver or heart failure in the last 12 months?     Have you been treated for mouth or throat cancer with radiation or surgery?     Have you been told that it is difficult for doctors to insert a breathing tube in you during anesthesia?     Have you had a heart or lung transplant?          Are you on dialysis?     Do you have cirrhosis of the liver?     Do you have myasthenia gravis?     Is the patient a prisoner?        TRUE  Have you been diagnosed with sleep apnea or do you wear a  CPAP machine at night?     Are you younger than 30?   TRUE  Have you previously received propofol sedation administered by an anesthesiologist for a GI procedure?     Do you drink an average of more than 3 drinks of alcohol per day?     Do you regularly take suboxone or any prescription medications for chronic pain?     Do you regularly take Ativan, Klonopin, Xanax, Valium, lorazepam, clonazepam, alprazolam, or diazepam?     Have you previously had difficulty with sedation during a GI procedure?     Have you been diagnosed with PTSD?     Are you allergic to fentanyl or midazolam (Versed)?     Do you take medications for HIV?   ################# ## ###################################################################################################################   MRN:          [id number]   Anticoag Review:  No   Nurse Triage:  No   GI Clinic Consult:  No   Procedure(s):  Colonoscopy     0   Location[facility]s):  [facility]     [facility]     [place]        Endoscopist:  GENERIC   Urgent:            Yes   Prep:               [place]                  ################# ## ###################################################################################################################

## 2022-09-03 MED ORDER — PEG 3350-ELECTROLYTES 236 GRAM-22.74 GRAM-6.74 GRAM-5.86 GRAM SOLUTION
ORAL | 0 refills | 0 days | Status: CP
Start: 2022-09-03 — End: ?

## 2022-09-04 DIAGNOSIS — C220 Liver cell carcinoma: Secondary | ICD-10-CM | POA: Diagnosis not present

## 2022-09-04 DIAGNOSIS — Z5181 Encounter for therapeutic drug level monitoring: Secondary | ICD-10-CM | POA: Diagnosis not present

## 2022-09-04 DIAGNOSIS — E612 Magnesium deficiency: Secondary | ICD-10-CM | POA: Diagnosis not present

## 2022-09-04 DIAGNOSIS — Z944 Liver transplant status: Secondary | ICD-10-CM | POA: Diagnosis not present

## 2022-09-05 LAB — MAGNESIUM: MAGNESIUM: 2 mg/dL (ref 1.6–2.3)

## 2022-09-05 LAB — COMPREHENSIVE METABOLIC PANEL
ALBUMIN: 3.5 g/dL — ABNORMAL LOW (ref 3.8–4.9)
ALKALINE PHOSPHATASE: 100 IU/L (ref 44–121)
ALT (SGPT): 19 IU/L (ref 0–44)
AST (SGOT): 21 IU/L (ref 0–40)
BILIRUBIN TOTAL (MG/DL) IN SER/PLAS: 0.4 mg/dL (ref 0.0–1.2)
BLOOD UREA NITROGEN: 19 mg/dL (ref 8–27)
BUN / CREAT RATIO: 16 (ref 10–24)
CALCIUM: 9 mg/dL (ref 8.6–10.2)
CHLORIDE: 96 mmol/L (ref 96–106)
CO2: 19 mmol/L — ABNORMAL LOW (ref 20–29)
CREATININE: 1.21 mg/dL (ref 0.76–1.27)
EGFR: 69 mL/min/{1.73_m2}
GLOBULIN, TOTAL: 3.4 g/dL (ref 1.5–4.5)
GLUCOSE: 126 mg/dL — ABNORMAL HIGH (ref 70–99)
POTASSIUM: 3.3 mmol/L — ABNORMAL LOW (ref 3.5–5.2)
SODIUM: 134 mmol/L (ref 134–144)
TOTAL PROTEIN: 6.9 g/dL (ref 6.0–8.5)

## 2022-09-05 LAB — CBC W/ DIFFERENTIAL
BANDED NEUTROPHILS ABSOLUTE COUNT: 0.1 10*3/uL (ref 0.0–0.1)
BASOPHILS ABSOLUTE COUNT: 0.1 10*3/uL (ref 0.0–0.2)
BASOPHILS RELATIVE PERCENT: 1 %
EOSINOPHILS ABSOLUTE COUNT: 0.3 10*3/uL (ref 0.0–0.4)
EOSINOPHILS RELATIVE PERCENT: 2 %
HEMATOCRIT: 36.3 % — ABNORMAL LOW (ref 37.5–51.0)
HEMOGLOBIN: 11.9 g/dL — ABNORMAL LOW (ref 13.0–17.7)
IMMATURE GRANULOCYTES: 1 %
LYMPHOCYTES ABSOLUTE COUNT: 1.1 10*3/uL (ref 0.7–3.1)
LYMPHOCYTES RELATIVE PERCENT: 8 %
MEAN CORPUSCULAR HEMOGLOBIN CONC: 32.8 g/dL (ref 31.5–35.7)
MEAN CORPUSCULAR HEMOGLOBIN: 25.9 pg — ABNORMAL LOW (ref 26.6–33.0)
MEAN CORPUSCULAR VOLUME: 79 fL (ref 79–97)
MONOCYTES ABSOLUTE COUNT: 1.6 10*3/uL — ABNORMAL HIGH (ref 0.1–0.9)
MONOCYTES RELATIVE PERCENT: 12 %
NEUTROPHILS ABSOLUTE COUNT: 10.5 10*3/uL — ABNORMAL HIGH (ref 1.4–7.0)
NEUTROPHILS RELATIVE PERCENT: 76 %
PLATELET COUNT: 508 10*3/uL — ABNORMAL HIGH (ref 150–450)
RED BLOOD CELL COUNT: 4.59 x10E6/uL (ref 4.14–5.80)
RED CELL DISTRIBUTION WIDTH: 12.8 % (ref 11.6–15.4)
WHITE BLOOD CELL COUNT: 13.6 10*3/uL — ABNORMAL HIGH (ref 3.4–10.8)

## 2022-09-05 LAB — BILIRUBIN, DIRECT: BILIRUBIN DIRECT: 0.18 mg/dL (ref 0.00–0.40)

## 2022-09-05 LAB — PHOSPHORUS: PHOSPHORUS, SERUM: 3.1 mg/dL (ref 2.8–4.1)

## 2022-09-05 LAB — GAMMA GT: GAMMA GLUTAMYL TRANSFERASE: 29 IU/L (ref 0–65)

## 2022-09-05 LAB — AFP TUMOR MARKER: AFP-TUMOR MARKER: <1.8 ng/mL (ref 0.0–8.4)

## 2022-09-07 DIAGNOSIS — E612 Magnesium deficiency: Principal | ICD-10-CM

## 2022-09-07 DIAGNOSIS — Z944 Liver transplant status: Principal | ICD-10-CM

## 2022-09-07 DIAGNOSIS — Z5181 Encounter for therapeutic drug level monitoring: Principal | ICD-10-CM

## 2022-09-07 DIAGNOSIS — E876 Hypokalemia: Principal | ICD-10-CM

## 2022-09-07 LAB — TACROLIMUS LEVEL: TACROLIMUS BLOOD: 10 ng/mL (ref 2.0–20.0)

## 2022-09-07 MED ORDER — POTASSIUM CHLORIDE ER 20 MEQ TABLET,EXTENDED RELEASE(PART/CRYST)
ORAL_TABLET | Freq: Every day | ORAL | 01 refills | 30 days | Status: CP
Start: 2022-09-07 — End: 2023-09-07

## 2022-09-07 NOTE — Unmapped (Signed)
Spoke with [name] regarding upcoming GI procedure on 09/09/22 at [facility] location at 0900 with an arrival time of 0800.     Verified that [facility] prep instructions had been received, reviewed and understood. Patient had not yet read instructions, provided date of MyChart message with instructions attached, reviewed pertinent information and advised to read instructions as soon as able. Confirmed patient has number if questions arise.      Verified prep medications ordered/received.    Reviewed information regarding holding jardiance and metformin day of procedure (until after procedure), as well as holding vitamins/supplements/oil based items/iron containing products the day before and day of procedure. All other approved medications should be taken at least two hours prior to appointment time.      Reviewed low fiber diet should have started three days prior to procedure.    Reviewed LIQUID diet requirement the entire day prior to procedure as well as NOTHING AT ALL 2 hours prior to procedure    Reviewed driver requirement (18 or older, must be able to drive patient home, must remain within 20 minutes of facility and reachable by cell phone).      No further questions at this time.

## 2022-09-07 NOTE — Unmapped (Signed)
Encounter for potassium prescription

## 2022-09-09 ENCOUNTER — Encounter
Admit: 2022-09-09 | Discharge: 2022-09-09 | Payer: MEDICARE | Attending: Certified Registered" | Primary: Certified Registered"

## 2022-09-09 ENCOUNTER — Ambulatory Visit: Admit: 2022-09-09 | Discharge: 2022-09-09 | Payer: MEDICARE

## 2022-09-09 DIAGNOSIS — D849 Immunodeficiency, unspecified: Principal | ICD-10-CM

## 2022-09-09 DIAGNOSIS — R197 Diarrhea, unspecified: Principal | ICD-10-CM

## 2022-09-09 DIAGNOSIS — Z944 Liver transplant status: Principal | ICD-10-CM

## 2022-09-09 DIAGNOSIS — K648 Other hemorrhoids: Secondary | ICD-10-CM | POA: Diagnosis not present

## 2022-09-09 DIAGNOSIS — E119 Type 2 diabetes mellitus without complications: Secondary | ICD-10-CM | POA: Diagnosis not present

## 2022-09-09 DIAGNOSIS — I2510 Atherosclerotic heart disease of native coronary artery without angina pectoris: Secondary | ICD-10-CM | POA: Diagnosis not present

## 2022-09-09 DIAGNOSIS — R933 Abnormal findings on diagnostic imaging of other parts of digestive tract: Secondary | ICD-10-CM | POA: Diagnosis not present

## 2022-09-09 DIAGNOSIS — Z7984 Long term (current) use of oral hypoglycemic drugs: Secondary | ICD-10-CM | POA: Diagnosis not present

## 2022-09-09 DIAGNOSIS — Z79890 Hormone replacement therapy (postmenopausal): Secondary | ICD-10-CM | POA: Diagnosis not present

## 2022-09-09 DIAGNOSIS — I10 Essential (primary) hypertension: Secondary | ICD-10-CM | POA: Diagnosis not present

## 2022-09-09 DIAGNOSIS — K6389 Other specified diseases of intestine: Secondary | ICD-10-CM | POA: Diagnosis not present

## 2022-09-09 DIAGNOSIS — K921 Melena: Secondary | ICD-10-CM | POA: Diagnosis not present

## 2022-09-09 DIAGNOSIS — I080 Rheumatic disorders of both mitral and aortic valves: Secondary | ICD-10-CM | POA: Diagnosis not present

## 2022-09-09 DIAGNOSIS — Z79899 Other long term (current) drug therapy: Secondary | ICD-10-CM | POA: Diagnosis not present

## 2022-09-09 DIAGNOSIS — Z7982 Long term (current) use of aspirin: Secondary | ICD-10-CM | POA: Diagnosis not present

## 2022-09-09 DIAGNOSIS — K529 Noninfective gastroenteritis and colitis, unspecified: Secondary | ICD-10-CM | POA: Diagnosis not present

## 2022-09-09 MED ADMIN — ePHEDrine (PF) 25 mg/5 mL (5 mg/mL) in 0.9% sodium chloride syringe Syrg: INTRAVENOUS | @ 13:00:00 | Stop: 2022-09-09 | NDC 50930028150

## 2022-09-09 MED ADMIN — lidocaine (PF) (XYLOCAINE-MPF) 20 mg/mL (2 %) injection: INTRAVENOUS | @ 13:00:00 | Stop: 2022-09-09 | NDC 70756064887

## 2022-09-09 MED ADMIN — lactated Ringers infusion: 10 mL/h | INTRAVENOUS | @ 13:00:00 | Stop: 2022-09-09 | NDC 11845118709

## 2022-09-09 MED ADMIN — Propofol (DIPRIVAN) injection: INTRAVENOUS | @ 13:00:00 | Stop: 2022-09-09 | NDC 72572061210

## 2022-09-09 NOTE — Unmapped (Signed)
Patient left VM for [facility] when coordinator was out of office, stating that his local [facility] could not find any orders for his stool testing. Patient completed colonoscopy today. Some tests in process. Spoke with patient and let him know he would need to provide another sample. Encouraged him to contact [facility] from [facility], if he has any issues when he takes a new stool sample there. He verbalized understanding.

## 2022-09-13 ENCOUNTER — Telehealth: Payer: Self-pay | Admitting: Family Medicine

## 2022-09-13 NOTE — Telephone Encounter (Signed)
Please get update on patient.  We have been trying to get his [facility] GI appointment moved sooner.  Please let me know how he is feeling.  Thanks.

## 2022-09-14 DIAGNOSIS — E612 Magnesium deficiency: Principal | ICD-10-CM

## 2022-09-14 DIAGNOSIS — Z5181 Encounter for therapeutic drug level monitoring: Principal | ICD-10-CM

## 2022-09-14 DIAGNOSIS — Z944 Liver transplant status: Principal | ICD-10-CM

## 2022-09-14 DIAGNOSIS — R197 Diarrhea, unspecified: Secondary | ICD-10-CM | POA: Diagnosis not present

## 2022-09-14 DIAGNOSIS — D849 Immunodeficiency, unspecified: Secondary | ICD-10-CM | POA: Diagnosis not present

## 2022-09-14 NOTE — Telephone Encounter (Signed)
Spoke to patient was able to get colonoscopy last week and has appointment tomorrow with transplant team. He states over all he is about the same. He did not have any questions at this time.

## 2022-09-14 NOTE — Unmapped (Signed)
[facility]  09/15/2022    Reason for visit: Status post liver transplantation on 09/16/2018 (Liver) (4 years) for Cirrhosis: Cryptogenic (Idiopathic), seen for follow up    Assessment/Plan:    62 y.o. male with past medical history of aortic valve stenosis, diabetes, hypertension, who underwent liver transplant on 09/16/2018 for cryptogenic cirrhosis with HCC.     He began  to have diarrhea after starting Ozempic in April 2024. GI pathogen stool studies negative at this point. Cdiff pending. Colonoscopy with ulcerations. He has lost around 50 lbs. Metformin on hold. Follow up with GI for earlier appointment.    Liver transplant  immunosuppression  -continue Prograf 2 mg BID, goal 4-6  -continue Cellcept 250  Mg BID (consider discontinuing if liver enzymes stabilize)     Hx HCC  -Retreat Score of 1: Abd MRI and Chest CT q 6 mos through 3rd txp anniversary (08/2021); AFPs q 6 mos through 5th anniversary (09/16/2023)  -08/28/2021 CT chest : no metastatic disease, ectasia of  ascending aorta 4.3 cm. No further CT chest needed  -03/30/2022 MRI abd W/Wo contrast: diffuse hepatic steatosis, ?fibrosis, wedge-shaped area of peripheral arterial phase hyperenhancement with hepatic seg 5/6, decreased in prominence compared to prior,  findings favored to be perfusional changes, no new lesions. Plan to repeat in 6 mos (August 2024)     s/p AVR+CABGx1 11/2020:  Cardiologist  Dr Dietrich Pates  -ASA 325 mg daily, metoprolol      Metabolic syndrome  (management per PCP)  -DM Jardiance, metformin on hold while he has diarrhea  -HTN metoprolol   -HLD pravastatin       Post-transplant health  maintenence  Discussed need for yearly full body skin examination, as there is an increased risk of skin cancer with immunosuppression.   Bone health:last Dexascan 08/2019: osteopenia. Taking alendronate. Management per PCP  Continue routine age-related cancer screening.   Last colonoscopy: 09/09/22 prolapsed hemorrhoids, severe mucosal changes were  found in the entire colon, biopsied  Vaccinations: Recommend the following vaccinations be given   -Hepatitis A: Havrix #1 (08/2021) #2 given today  -Hepatitis B: Heplisav #1 (08/2021) #2 given today  -Influenza (yearly): UTD  -Pneumococcal: PPSV23 (12/2017) and PCV13 (03/2019), eligible for Prevnar 20 (03/2024)  -Zoster: Shingrix  01/2021, 08/2020  -SARS-CoV-2: Pfizer x4, bivalent 02/2021, declines updated COVID vaccine     No follow-ups on file.    Priscille Heidelberg, NP  [facility]  Subjective   History of Present Illness   Accompanied by: spouse    62 y.o. male with past medical history of aortic valve stenosis, diabetes, hypertension, who underwent liver transplant on 09/16/2018 for cryptogenic cirrhosis with HCC.     Interval history:   Last visit was on 08/28/2021. In the interim, he started Ozempic beginning of May 2024, started having diarrhea shortly after starting Ozempic. He was seen in ED in June 2024, dx with colitis by symptoms and findings on CT. GI pathogen panel was negative. He was initially prescribed azithromycin for 5 days, subsequently  prescribed Cipro 500 mg BID for 3 days when his symptoms did not improve. He underwent a colonoscopy on 09/09/2022 that showed ulcerations (deep and shallow), biopsied.     Prior to taking Ozempic he had loose formed stools 3 times a day, no blood or mucous. He has lost 50 lbs in 6-8 weeks    Objective   Physical Exam   Vital Signs: BP 108/76  - Pulse 62  - Temp 36.3 ??C (  97.3 ??F) (Tympanic)  - Ht 175.3 cm (5' 9.02)  - Wt 69.9 kg (154 lb)  - SpO2 100%  - BMI 22.73 kg/m??   Constitutional: He is in no apparent distress, with good coloring  HENT: conjunctiva clear, anicteric, nares without discharge, neck supple  CV: Regular rate and rhythm  Lung: respirations even and unlabored  Abdomen: soft, non-distended, non-tender.  Extremities: No edema, well perfused  Neuro: Grossly intact  Mental Status: Thought organized, appropriate affect, engaged in conversation        I personally spent 35 minutes face-to-face and non-face-to-face in the care of this patient, which includes all pre, intra, and post visit time on the date of service

## 2022-09-15 ENCOUNTER — Ambulatory Visit: Admit: 2022-09-15 | Discharge: 2022-09-16 | Payer: MEDICARE

## 2022-09-15 DIAGNOSIS — Z23 Encounter for immunization: Principal | ICD-10-CM

## 2022-09-15 DIAGNOSIS — R799 Abnormal finding of blood chemistry, unspecified: Principal | ICD-10-CM

## 2022-09-15 DIAGNOSIS — D849 Immunodeficiency, unspecified: Principal | ICD-10-CM

## 2022-09-15 DIAGNOSIS — E876 Hypokalemia: Principal | ICD-10-CM

## 2022-09-15 DIAGNOSIS — K648 Other hemorrhoids: Secondary | ICD-10-CM | POA: Diagnosis not present

## 2022-09-15 DIAGNOSIS — Z952 Presence of prosthetic heart valve: Secondary | ICD-10-CM | POA: Diagnosis not present

## 2022-09-15 DIAGNOSIS — Z79899 Other long term (current) drug therapy: Secondary | ICD-10-CM | POA: Diagnosis not present

## 2022-09-15 DIAGNOSIS — Z944 Liver transplant status: Secondary | ICD-10-CM | POA: Diagnosis not present

## 2022-09-15 DIAGNOSIS — E8881 Metabolic syndrome: Secondary | ICD-10-CM | POA: Diagnosis not present

## 2022-09-15 DIAGNOSIS — E119 Type 2 diabetes mellitus without complications: Secondary | ICD-10-CM | POA: Diagnosis not present

## 2022-09-15 DIAGNOSIS — I10 Essential (primary) hypertension: Secondary | ICD-10-CM | POA: Diagnosis not present

## 2022-09-15 DIAGNOSIS — K760 Fatty (change of) liver, not elsewhere classified: Secondary | ICD-10-CM | POA: Diagnosis not present

## 2022-09-15 DIAGNOSIS — Z7984 Long term (current) use of oral hypoglycemic drugs: Secondary | ICD-10-CM | POA: Diagnosis not present

## 2022-09-15 DIAGNOSIS — K7400 Hepatic fibrosis, unspecified: Secondary | ICD-10-CM | POA: Diagnosis not present

## 2022-09-15 DIAGNOSIS — Z7985 Long-term (current) use of injectable non-insulin antidiabetic drugs: Secondary | ICD-10-CM | POA: Diagnosis not present

## 2022-09-15 DIAGNOSIS — M8580 Other specified disorders of bone density and structure, unspecified site: Secondary | ICD-10-CM | POA: Diagnosis not present

## 2022-09-15 LAB — COMPREHENSIVE METABOLIC PANEL
ALBUMIN: 2.2 g/dL — ABNORMAL LOW (ref 3.4–5.0)
ALKALINE PHOSPHATASE: 114 U/L (ref 46–116)
ALT (SGPT): 18 U/L (ref 10–49)
ANION GAP: 14 mmol/L (ref 5–14)
AST (SGOT): 15 U/L (ref <=34)
BILIRUBIN TOTAL: 0.5 mg/dL (ref 0.3–1.2)
BLOOD UREA NITROGEN: 25 mg/dL — ABNORMAL HIGH (ref 9–23)
BUN / CREAT RATIO: 18
CALCIUM: 9 mg/dL (ref 8.7–10.4)
CHLORIDE: 98 mmol/L (ref 98–107)
CO2: 23 mmol/L (ref 20.0–31.0)
CREATININE: 1.42 mg/dL — ABNORMAL HIGH
EGFR CKD-EPI (2021) MALE: 57 mL/min/{1.73_m2} — ABNORMAL LOW (ref >=60)
GLUCOSE RANDOM: 132 mg/dL — ABNORMAL HIGH (ref 70–99)
POTASSIUM: 2.9 mmol/L — ABNORMAL LOW (ref 3.5–5.1)
PROTEIN TOTAL: 6.6 g/dL (ref 5.7–8.2)
SODIUM: 135 mmol/L (ref 135–145)

## 2022-09-15 LAB — IRON PANEL
IRON SATURATION: 14 % — ABNORMAL LOW (ref 20–55)
IRON: 18 ug/dL — ABNORMAL LOW
TOTAL IRON BINDING CAPACITY: 131 ug/dL — ABNORMAL LOW (ref 250–425)

## 2022-09-15 LAB — CBC W/ AUTO DIFF
BASOPHILS ABSOLUTE COUNT: 0.1 10*9/L (ref 0.0–0.1)
BASOPHILS RELATIVE PERCENT: 0.5 %
EOSINOPHILS ABSOLUTE COUNT: 0.1 10*9/L (ref 0.0–0.5)
EOSINOPHILS RELATIVE PERCENT: 0.6 %
HEMATOCRIT: 37.7 % — ABNORMAL LOW (ref 39.0–48.0)
HEMOGLOBIN: 12.2 g/dL — ABNORMAL LOW (ref 12.9–16.5)
LYMPHOCYTES ABSOLUTE COUNT: 1.4 10*9/L (ref 1.1–3.6)
LYMPHOCYTES RELATIVE PERCENT: 13.5 %
MEAN CORPUSCULAR HEMOGLOBIN CONC: 32.3 g/dL (ref 32.0–36.0)
MEAN CORPUSCULAR HEMOGLOBIN: 25.4 pg — ABNORMAL LOW (ref 25.9–32.4)
MEAN CORPUSCULAR VOLUME: 78.5 fL (ref 77.6–95.7)
MEAN PLATELET VOLUME: 6.9 fL (ref 6.8–10.7)
MONOCYTES ABSOLUTE COUNT: 1 10*9/L — ABNORMAL HIGH (ref 0.3–0.8)
MONOCYTES RELATIVE PERCENT: 9.8 %
NEUTROPHILS ABSOLUTE COUNT: 7.7 10*9/L (ref 1.8–7.8)
NEUTROPHILS RELATIVE PERCENT: 75.6 %
PLATELET COUNT: 487 10*9/L — ABNORMAL HIGH (ref 150–450)
RED BLOOD CELL COUNT: 4.8 10*12/L (ref 4.26–5.60)
RED CELL DISTRIBUTION WIDTH: 16 % — ABNORMAL HIGH (ref 12.2–15.2)
WBC ADJUSTED: 10.2 10*9/L (ref 3.6–11.2)

## 2022-09-15 LAB — HEMOGLOBIN A1C
ESTIMATED AVERAGE GLUCOSE: 166 mg/dL
HEMOGLOBIN A1C: 7.4 % — ABNORMAL HIGH (ref 4.8–5.6)

## 2022-09-15 LAB — FERRITIN: FERRITIN: 907.1 ng/mL — ABNORMAL HIGH

## 2022-09-15 MED ORDER — POTASSIUM CHLORIDE ER 20 MEQ TABLET,EXTENDED RELEASE(PART/CRYST)
ORAL_TABLET | Freq: Every day | ORAL | 1 refills | 30 days | Status: CP
Start: 2022-09-15 — End: 2022-11-14
  Filled 2022-09-15: qty 30, 30d supply, fill #0

## 2022-09-15 NOTE — Unmapped (Signed)
Patient placed on contact isolation precaution.  Isolation gown and gloves used with direct patient contact.    Per provider, the patient received Hepatitis A and B vaccine.  Patient ID verified with name and date of birth.  All screening questions were answered.  Vaccine(s) were administered as ordered.  See immunization history for documentation.  Patient tolerated the injection(s) well with no issues noted.  Vaccine Information sheet given to the patient.

## 2022-09-15 NOTE — Unmapped (Addendum)
[facility]  [facility]  Jacquelyne Balint, ANP   [facility]  [address]  Main Clinic: [phone]  Appointment Schedulers: [phone]  Fax: [phone]       Thank you for allowing me to participate in your medical care today. Here are my recommendations based on today's visit:    Lab work today  Hold metformin, I will send endocrinologist results of HgA1c  Checking iron levels today  I will see if I can get you in to see Gastroenterology earlier  Use Calmoseptine on peri-anal skin  Do not use a donut to sit on   Limit time on toilet     Take care,  Jacquelyne Balint, ANP

## 2022-09-15 NOTE — Telephone Encounter (Signed)
Noted. Thanks. I'll await his consult note.

## 2022-09-16 ENCOUNTER — Telehealth: Admit: 2022-09-16 | Discharge: 2022-09-17 | Payer: MEDICARE | Attending: Ambulatory Care | Primary: Ambulatory Care

## 2022-09-16 DIAGNOSIS — K51914 Ulcerative colitis, unspecified with abscess: Principal | ICD-10-CM

## 2022-09-16 DIAGNOSIS — D849 Immunodeficiency, unspecified: Principal | ICD-10-CM

## 2022-09-16 DIAGNOSIS — Z92241 Personal history of systemic steroid therapy: Principal | ICD-10-CM

## 2022-09-16 DIAGNOSIS — K529 Noninfective gastroenteritis and colitis, unspecified: Principal | ICD-10-CM

## 2022-09-16 MED ORDER — SULFAMETHOXAZOLE 400 MG-TRIMETHOPRIM 80 MG TABLET
ORAL_TABLET | ORAL | 2 refills | 30 days | Status: CP
Start: 2022-09-16 — End: 2022-12-15

## 2022-09-16 MED ORDER — PREDNISONE 10 MG TABLET
ORAL_TABLET | ORAL | 0 refills | 42 days | Status: CP
Start: 2022-09-16 — End: 2022-10-28

## 2022-09-16 NOTE — Unmapped (Signed)
Patient completed colonoscopy and path report noted severely active colitis with ulceration. Reached out to Dr.Shah and NP Harms with report. Dr.Shah recommended expediting patient's GI appt and starting him on 40mg  Prednisone daily x 2 weeks, then taper to 30mg  x 7 days, 20mg  x7 days, and 10mg  x 7 days per GI recommendations. He advised against his using imodium. Spoke with patient, who said the GI provider had just called him with the steroid prescription. Per Dr.Shah, patient should start bactrim SS every Mon/Wed/Fri until his prednisone dose is <20. Per PharmD Chargualaf, that should still be alright with the plan to replete his KCl, but he should repeat labs weekly for the next couple weeks. Returned call to patient and let him know bactrim SS was prescribed as well for pna prophy. He verbalized understanding and agreed to repeat labs weekly x 2.

## 2022-09-16 NOTE — Unmapped (Signed)
Started on prednisone 40 mg daily for 14 days then taper

## 2022-09-16 NOTE — Unmapped (Signed)
Brief GI Telephone Note:    Called patient re: path results. Still with diarrhea; five episodes today. All loose. No bleeding today.  Discussed the case with Dr. Wellington Hampshire (IBD) and showed him the CT scan, colonoscopy report, and path results. Will plan to treat with prednisone and repeat flex sig in ~6 weeks. He will see Dr. Julieta Bellini in IBD clinic. Have copied his liver transplant team as he will need close monitoring of blood sugars while on prednisone, and he should be considered for bactrim for PCP prophylaxis.    Claria Dice. Penny Pia, MD  Assistant Professor, Division of Gastroenterology  Director of Bariatric Endoscopy  Advanced Therapeutic Endoscopy/Biliary Team      Diagnosis   A: Colon, biopsy:  -Severely active colitis with ulceration.  (See comment)         This electronic signature is attestation that the pathologist personally reviewed the submitted material(s) and the final diagnosis reflects that evaluation.   Electronically signed by Elesa Massed, MD on 09/16/2022 at [phone]   Diagnosis Comment    The differential diagnosis includes infectious etiology, drug effect and inflammatory bowel disease. No definite features of chronicity are appreciated.  CMV immunostain shows only rare positive cells in lamina propria. No conspicuous evidence of viropathic change is seen on H&E stain. HSV1/2 immunostain is negative.

## 2022-09-16 NOTE — Unmapped (Incomplete)
[facility] Endocrinology at [facility]  [address]  [place], [place] [address]    Clinical Pharmacist Visit Summary    The patient reports they are physically located in [place] and is currently: at home. I conducted a audio/video visit. I spent  0s on the video call with the patient. I spent an additional 10 minutes on pre- and post-visit activities on the date of service .      Assessment and Plan:   1. Diabetes, post-transplant: fair control.  Due for repeat A1c; last A1c = 7.4% on 04/06/2022 with goal <7% without hypoglycemia.   SMBG data unavailable, as patient does not check frequently. Self reports BG 100s - 160s without any BG <70 mg/dl since stopping Ozempic.   Takes Jardiance 10 mg daily with good adherence and without adverse effects or affordability concerns (gets branded medications via [facility]). Restarted Ozempic after CPP apt on 6/25****. Was diagnosed with colitis on 6/23 and is on azithromycin day 3 of 5; has PCP follow up this afternoon and has been referred to GI. Denies regular physical activity; diet is mostly unchanged except now consists of smaller portions of fast foods.   With shared decision making, will continue Jardiance as prescribed and HOLD Ozempic and metformin due to worsening GI issues. We discussed switching Ozempic to oral Rybelsus or daily Victoza via [facility]. We also discussed switching GLP-1 RA to DPP4i, though he would lose CV benefit which is less than ideal given his history of CAD. There is opportunity to titrate Jardiance, but the extent to which this would improve his glycemic control is likely minimal. Recommended that patient reduce intake of high fat, greasy foods that may be exacerbating his GI symptoms and possibility contributing to hyperglycemia.   Continue to HOLD Ozempic and metformin XR 1000 mg PO BID.   Continue Jardiance 10 mg PO daily.  Reviewed signs/symptoms/treatment of hypoglycemia.   Follow up with clinical pharmacist on 09/16/2022 to discuss retrial of GLP-1 RA.  Follow up with Dr. Beryle Flock MD on 8/26/202.     Theodosia Paling, PharmD, CPP, BCACP, CDCES    I spent a total of  27  minutes face to face with the patient delivering clinical care and providing education/counseling.     Subjective:   Reason for visit: Follow up with Clinical Pharmacist Practitioner for blood glucose review and adjustment of diabetes regimen as needed.    Gildardo Lardner is a 62 y.o. year old male with a history of diabetes post-transplant who presents today for a diabetes-related visit. PMH includes liver transplant (2022), hepatic steatosis, obesity, hypothyroidism, and HTN, CAD s/p CABG (2023)    Diabetes Provider and Last Visit Date: Beatrix Shipper, CPP on 05/12/2022    At Last Visit:   Start Ozempic 0.25 mg subQ once weekly for 4 weeks, then increase to 0.5 mg weekly, if unable to tolerate please discontinue medication.  Continue metformin XR 1000 mg PO BID.  Continue Jardiance 10 mg PO daily.    Interval History of Present Illness:   08/10/22: Patient sent MyChart message stating Hi, i'am not tolerating the ozempic good. I have diarrea bad and it feels like I need to go to bathroom all the time. I stopped taking it sunday 6/08 and did not take any yesterday. I have had no change since stopping. I have took at least 6 weeks worth. It's also time for my follow up. Patient was scheduled with CPP to follow up re: GI ADE  08/16/22: Seen in ED, diagnosed with colitis and prescribed azithromycin. Also had workup for C. Difficile, results pending.    08/18/22:     09/02/22:     Today: Patient presents virtually for diabetes follow up. He reports adherence to metformin XR 1000 mg BID and Jardiance 10 mg daily. Was taking Ozempic 0.25 mg weekly x 7 doses, but self-discontinued the medication on 08/01/22 due to severe diarrhea resulting in recent ED visit 2 days ago (though symptoms did not improve upon discontinuation, was having diarrhea prior to GLP-1 RA initiation). Denies missed doses of Jardiance; admits to intermittently stopping metformin for 1 to 2 days or skipping a dose to see if diarrhea would improve. Had 1 severe episode of diarrhea with sweating and dry heaving. He is currently on day 3 of 5 of azithromycin. States diarrhea appeared to improve yesterday evening, but symptoms returned to baseline today. Has a PCP visit this afternoon for follow up.     Denies affordability concerns (has [employer], getting Jardiance and Ozempic through [facility]). Checks BG using glucometer at home off and on. When he has checked, BG have been running in the 100s - 160s since stopping the Ozempic. Denies symptoms hypoglycemia or BG <70 mg/dl. Diet and exercise as documented below. Discussed the importance of medication adherence, diet, and exercise to improve glycemic control. He had no additional questions or concerns.    Diet (Typical):  Breakfast: Cereal  Lunch: Fast food (Hamburgers/fries), grilled cheese sandwich   Dinner: Vegetables (lima/pinto beans/corn/ turnip greens), spaghetti, brown rice, tacos, hotdogs  Snacks (evening): Pretzels, chips, chocolate chip cookies  Beverages: Water, diet Pepsi, regular Gatorade   09/16/22: Has cut way back on snacking; having small handful of pretzels - portion control.     Exercise: Denies regular physical activity, walking through [facility]  09/16/22: Denies major changes.     Social History:   Tobacco use: no  Illicit drug use use: no  Alcohol use: no  Occupation: Unemployed  Household: Lives alone    POC glucose level today of N/A mg/dL - virtual visit.     Glucose Monitoring: Glucose Meter - Did not have BG data today.     Hypoglycemia:    Symptoms of hypoglycemia since last visit: no   If no recent hypoglycemia, prior recognition of hypoglycemia symptoms and knowledge of treatment: yes   Treats with:  n/a    Current Medications: Reports adherence to the following diabetes medications:   Ozempic 0.25 mg once weekly - stopped on 08/01/2022  Metformin XR 1000 mg BID   Jardiance 10 mg daily    CARDIOVASCULAR RISK REDUCTION  History of clinical ASCVD? yes, CAD s/p CABG 2023  History of heart failure? no  History of hyperlipidemia? yes  Taking statin? yes, pravastatin 40 mg daily  Taking aspirin? yes  Taking SGLT-2i and/or GLP- 1 RA? yes, Jardiance 10 mg daily; stopped Ozempic on 08/01/2022    BLOOD PRESSURE CONTROL  History of hypertension?: yes  Taking ACEi/ARB? no    KIDNEY CARE  History of Chronic Kidney Disease? no  History of albuminuria? no  Taking SGLT-2i and/or GLP- 1 RA? As above  Taking ACEi/ARB? As above      Current Outpatient Medications:     aspirin 325 MG tablet, Take 1 tablet (325 mg total) by mouth daily., Disp: , Rfl:     azelastine (ASTELIN) 137 mcg (0.1 %) nasal spray, 1 spray into each nostril daily as needed., Disp: , Rfl:  blood sugar diagnostic (ONETOUCH ULTRA TEST) Strp, USE TO CHECK SUGAR DAILY, Disp: , Rfl:     calcium carbonate (TUMS) 200 mg calcium (500 mg) chewable tablet, Chew 1 tablet (200 mg of elem calcium total) daily. Pt reports taking every other day., Disp: , Rfl:     FEROCON 110-0.5 mg capsule, TAKE 1 CAPSULE BY MOUTH 2 (TWO) TIMES DAILY AFTER A MEAL. NOT COVERED BY INSURANCE. (Patient not taking: Reported on 09/15/2022), Disp: , Rfl:     fluticasone propionate (FLONASE) 50 mcg/actuation nasal spray, PLACE ONE OR TWO SPRAYS INTO BOTH NOSTRILS DAILY AS NEEDED., Disp: , Rfl:     JARDIANCE 10 mg tablet, Pt reports that he has not taken this medication in about 1 month due to the price., Disp: , Rfl:     levothyroxine (SYNTHROID) 175 MCG tablet, Take 1 tablet (175 mcg total) by mouth daily before breakfast., Disp: 90 tablet, Rfl: 1    metoPROLOL tartrate (LOPRESSOR) 25 MG tablet, Take 1 tablet (25 mg total) by mouth two (2) times a day., Disp: 180 tablet, Rfl: 1    mycophenolate (CELLCEPT) 250 mg capsule, Take 1 capsule (250 mg total) by mouth Two (2) times a day., Disp: 180 capsule, Rfl: 3    potassium chloride 20 MEQ ER tablet, Take 1 tablet (20 mEq total) by mouth daily., Disp: 30 tablet, Rfl: 1    pravastatin (PRAVACHOL) 40 MG tablet, Take 1 tablet (40 mg total) by mouth every evening., Disp: 90 tablet, Rfl: 0    sertraline (ZOLOFT) 50 MG tablet, Take 1 tablet (50 mg total) by mouth daily., Disp: 90 tablet, Rfl: 3    tacrolimus (PROGRAF) 1 MG capsule, Take 2 capsules (2 mg total) by mouth two (2) times a day., Disp: 120 capsule, Rfl: 11  No current facility-administered medications for this visit.    Facility-Administered Medications Ordered in Other Visits:     diazePAM (VALIUM) 5 mg/mL injection, , , ,     Objective:   Vitals:    There were no vitals filed for this visit.      Past Medical History:    Active Ambulatory Problems     Diagnosis Date Noted    Diabetes mellitus without complication (CMS-HCC) 05/09/2017    Hypothyroidism 08/09/2006    Cirrhosis of liver without ascites (CMS-HCC) 01/10/2018    OSA (obstructive sleep apnea) 09/14/2011    Essential hypertension 08/09/2006    Encounter for pre-transplant evaluation for chronic liver disease 03/10/2018    Pleural effusion 09/09/2018    Pneumothorax after biopsy 09/11/2018    Liver transplant recipient (CMS-HCC) 09/16/2018    Allergic rhinitis 08/09/2006    Arthralgia 10/25/2017    Asthma 09/14/2011    Bicuspid aortic valve 02/25/2011    Flying phobia 09/18/2013    Mucosal abnormality of stomach 09/27/2018    Nephrolithiasis 07/28/2018    Pure hypercholesterolemia 02/14/2007    Umbilical hernia 08/14/2012    Unspecified hearing loss 08/11/2006    COVID-19 03/16/2019     Resolved Ambulatory Problems     Diagnosis Date Noted    No Resolved Ambulatory Problems     Past Medical History:   Diagnosis Date    Aortic valve stenosis     Autoimmune cholangitis     Carrier of hemochromatosis HFE gene mutation     Cholestatic cirrhosis (CMS-HCC)     Hepatic encephalopathy (CMS-HCC)     Hypertension     Other osteoporosis without current pathological fracture     Sleep  apnea     Type 2 diabetes mellitus (CMS-HCC)        Wt Readings from Last 3 Encounters:   09/15/22 69.9 kg (154 lb)   05/12/22 93.2 kg (205 lb 6.4 oz)   04/06/22 93.9 kg (207 lb)       Lab Results   Component Value Date    A1C 7.4 (H) 09/15/2022    A1C 7.4 (H) 04/06/2022    A1C 6.6 (H) 09/18/2019    A1C 6.0 (H) 04/17/2019    A1C 5.9 (H) 12/19/2018    A1C 4.7 (L) 10/24/2018    A1C 4.4 (L) 09/15/2018    A1C 5.1 01/04/2018       Lab Results   Component Value Date    NA 135 09/15/2022    K 2.9 (L) 09/15/2022    CL 98 09/15/2022    CO2 23.0 09/15/2022    BUN 25 (H) 09/15/2022    CREATININE 1.42 (H) 09/15/2022    GLU 132 (H) 09/15/2022    CALCIUM 9.0 09/15/2022    ALBUMIN 2.2 (L) 09/15/2022    PHOS 3.3 08/28/2021       Lab Results   Component Value Date    ALKPHOS 114 09/15/2022    BILITOT 0.5 09/15/2022    BILIDIR 0.18 09/04/2022    PROT 6.6 09/15/2022    ALBUMIN 2.2 (L) 09/15/2022    ALT 18 09/15/2022    AST 15 09/15/2022       No results found for: [facility]    Lab Results   Component Value Date    CHOL 122 04/17/2019    CHOL 114 12/19/2018     Lab Results   Component Value Date    HDL 33 (L) 04/17/2019    HDL 33 (L) 12/19/2018     Lab Results   Component Value Date    LDL 52 (L) 04/17/2019    LDL 32 (L) 12/19/2018    LDL 79.6 03/21/2018     Lab Results   Component Value Date    VLDL 37 04/17/2019    VLDL 47.4 (H) 12/19/2018     Lab Results   Component Value Date    CHOLHDLRATIO 3.7 04/17/2019    CHOLHDLRATIO 3.5 12/19/2018     Lab Results   Component Value Date    TRIG 185 (H) 04/17/2019    TRIG 243 (H) 12/19/2018       The 10-year ASCVD risk score (Arnett DK, et al., 2019) is: 12.7%    Values used to calculate the score:      Age: 69 years      Sex: Male      Is Non-Hispanic African American: No      Diabetic: Yes      Tobacco smoker: No      Systolic Blood Pressure: 108 mmHg      Is BP treated: Yes      HDL Cholesterol: 28 mg/dL      Total Cholesterol: 113 mg/dL    Note: For patients with SBP <90 or >200, Total Cholesterol <130 or >320, HDL <20 or >100 which are outside of the allowable range, the calculator will use these upper or lower values to calculate the patient???s risk score.

## 2022-09-17 NOTE — Unmapped (Signed)
Opened in error

## 2022-09-19 NOTE — Unmapped (Signed)
Patient did not respond to MyChart message and there was not a note from [facility] regarding communication with instructions to stop mychophanate. I spoke with him, advised to stop Cellcept (mycophenolate). He started prednisone a few days ago. Stool frequency has decreased some after starting prednisone. Advised him to hydrate with at least 64 oz of non-caffeinated fluids. Suggested Body Armour drink and Liquid IV (or generic).

## 2022-09-20 NOTE — Unmapped (Signed)
Patient did not respond to MyChart message and there was not a note from [facility] regarding communication with instructions to stop mychophanate. I spoke with him, advised to stop Cellcept (mycophenolate). He started prednisone a few days ago. Stool frequency has decreased some after starting prednisone. Advised him to hydrate with at least 64 oz of non-caffeinated fluids. Suggested Body Armour drink and Liquid IV (or generic).

## 2022-09-21 ENCOUNTER — Emergency Department (HOSPITAL_COMMUNITY): Payer: Medicare Other

## 2022-09-21 ENCOUNTER — Inpatient Hospital Stay (HOSPITAL_COMMUNITY)
Admission: EM | Admit: 2022-09-21 | Discharge: 2022-09-26 | DRG: 377 | Disposition: A | Discharge status: Other Acute Inpatient Hospital | Payer: Medicare Other | Attending: Family Medicine | Admitting: Family Medicine

## 2022-09-21 ENCOUNTER — Encounter (HOSPITAL_COMMUNITY): Payer: Self-pay

## 2022-09-21 DIAGNOSIS — Z944 Liver transplant status: Principal | ICD-10-CM

## 2022-09-21 DIAGNOSIS — E612 Magnesium deficiency: Principal | ICD-10-CM

## 2022-09-21 DIAGNOSIS — Z5181 Encounter for therapeutic drug level monitoring: Principal | ICD-10-CM

## 2022-09-21 DIAGNOSIS — Z86711 Personal history of pulmonary embolism: Secondary | ICD-10-CM

## 2022-09-21 DIAGNOSIS — R571 Hypovolemic shock: Secondary | ICD-10-CM | POA: Diagnosis present

## 2022-09-21 DIAGNOSIS — R55 Syncope and collapse: Secondary | ICD-10-CM | POA: Diagnosis not present

## 2022-09-21 DIAGNOSIS — K648 Other hemorrhoids: Secondary | ICD-10-CM | POA: Diagnosis not present

## 2022-09-21 DIAGNOSIS — K921 Melena: Secondary | ICD-10-CM | POA: Diagnosis not present

## 2022-09-21 DIAGNOSIS — E876 Hypokalemia: Secondary | ICD-10-CM | POA: Diagnosis not present

## 2022-09-21 DIAGNOSIS — Z7983 Long term (current) use of bisphosphonates: Secondary | ICD-10-CM

## 2022-09-21 DIAGNOSIS — I959 Hypotension, unspecified: Secondary | ICD-10-CM | POA: Diagnosis not present

## 2022-09-21 DIAGNOSIS — W1811XA Fall from or off toilet without subsequent striking against object, initial encounter: Secondary | ICD-10-CM | POA: Diagnosis not present

## 2022-09-21 DIAGNOSIS — Z7984 Long term (current) use of oral hypoglycemic drugs: Secondary | ICD-10-CM

## 2022-09-21 DIAGNOSIS — Z8042 Family history of malignant neoplasm of prostate: Secondary | ICD-10-CM

## 2022-09-21 DIAGNOSIS — Z79890 Hormone replacement therapy (postmenopausal): Secondary | ICD-10-CM | POA: Diagnosis not present

## 2022-09-21 DIAGNOSIS — E1169 Type 2 diabetes mellitus with other specified complication: Secondary | ICD-10-CM | POA: Diagnosis not present

## 2022-09-21 DIAGNOSIS — K625 Hemorrhage of anus and rectum: Secondary | ICD-10-CM

## 2022-09-21 DIAGNOSIS — Z79621 Long term (current) use of calcineurin inhibitor: Secondary | ICD-10-CM

## 2022-09-21 DIAGNOSIS — Z952 Presence of prosthetic heart valve: Secondary | ICD-10-CM

## 2022-09-21 DIAGNOSIS — Z800 Family history of malignant neoplasm of digestive organs: Secondary | ICD-10-CM

## 2022-09-21 DIAGNOSIS — K2980 Duodenitis without bleeding: Secondary | ICD-10-CM | POA: Diagnosis not present

## 2022-09-21 DIAGNOSIS — Z951 Presence of aortocoronary bypass graft: Secondary | ICD-10-CM | POA: Diagnosis not present

## 2022-09-21 DIAGNOSIS — T380X5A Adverse effect of glucocorticoids and synthetic analogues, initial encounter: Secondary | ICD-10-CM | POA: Diagnosis present

## 2022-09-21 DIAGNOSIS — R578 Other shock: Secondary | ICD-10-CM | POA: Diagnosis not present

## 2022-09-21 DIAGNOSIS — Z7982 Long term (current) use of aspirin: Secondary | ICD-10-CM

## 2022-09-21 DIAGNOSIS — E875 Hyperkalemia: Secondary | ICD-10-CM | POA: Diagnosis not present

## 2022-09-21 DIAGNOSIS — E039 Hypothyroidism, unspecified: Secondary | ICD-10-CM | POA: Diagnosis present

## 2022-09-21 DIAGNOSIS — J45909 Unspecified asthma, uncomplicated: Secondary | ICD-10-CM | POA: Diagnosis present

## 2022-09-21 DIAGNOSIS — D649 Anemia, unspecified: Secondary | ICD-10-CM | POA: Diagnosis not present

## 2022-09-21 DIAGNOSIS — K2950 Unspecified chronic gastritis without bleeding: Secondary | ICD-10-CM | POA: Diagnosis not present

## 2022-09-21 DIAGNOSIS — E440 Moderate protein-calorie malnutrition: Secondary | ICD-10-CM | POA: Diagnosis not present

## 2022-09-21 DIAGNOSIS — K922 Gastrointestinal hemorrhage, unspecified: Secondary | ICD-10-CM | POA: Diagnosis not present

## 2022-09-21 DIAGNOSIS — Y92009 Unspecified place in unspecified non-institutional (private) residence as the place of occurrence of the external cause: Secondary | ICD-10-CM | POA: Diagnosis not present

## 2022-09-21 DIAGNOSIS — K7469 Other cirrhosis of liver: Secondary | ICD-10-CM | POA: Diagnosis not present

## 2022-09-21 DIAGNOSIS — K529 Noninfective gastroenteritis and colitis, unspecified: Secondary | ICD-10-CM | POA: Diagnosis not present

## 2022-09-21 DIAGNOSIS — Z825 Family history of asthma and other chronic lower respiratory diseases: Secondary | ICD-10-CM

## 2022-09-21 DIAGNOSIS — G4733 Obstructive sleep apnea (adult) (pediatric): Secondary | ICD-10-CM | POA: Diagnosis present

## 2022-09-21 DIAGNOSIS — D509 Iron deficiency anemia, unspecified: Secondary | ICD-10-CM | POA: Diagnosis not present

## 2022-09-21 DIAGNOSIS — R61 Generalized hyperhidrosis: Secondary | ICD-10-CM | POA: Diagnosis not present

## 2022-09-21 DIAGNOSIS — K760 Fatty (change of) liver, not elsewhere classified: Secondary | ICD-10-CM | POA: Diagnosis present

## 2022-09-21 DIAGNOSIS — E785 Hyperlipidemia, unspecified: Secondary | ICD-10-CM | POA: Diagnosis present

## 2022-09-21 DIAGNOSIS — T451X5A Adverse effect of antineoplastic and immunosuppressive drugs, initial encounter: Secondary | ICD-10-CM | POA: Diagnosis present

## 2022-09-21 DIAGNOSIS — K5010 Crohn's disease of large intestine without complications: Secondary | ICD-10-CM | POA: Diagnosis present

## 2022-09-21 DIAGNOSIS — E872 Acidosis, unspecified: Secondary | ICD-10-CM | POA: Diagnosis present

## 2022-09-21 DIAGNOSIS — D72829 Elevated white blood cell count, unspecified: Secondary | ICD-10-CM | POA: Diagnosis not present

## 2022-09-21 DIAGNOSIS — I2510 Atherosclerotic heart disease of native coronary artery without angina pectoris: Secondary | ICD-10-CM | POA: Diagnosis not present

## 2022-09-21 DIAGNOSIS — D62 Acute posthemorrhagic anemia: Secondary | ICD-10-CM | POA: Diagnosis not present

## 2022-09-21 DIAGNOSIS — K2981 Duodenitis with bleeding: Principal | ICD-10-CM | POA: Diagnosis present

## 2022-09-21 DIAGNOSIS — I10 Essential (primary) hypertension: Secondary | ICD-10-CM | POA: Diagnosis not present

## 2022-09-21 DIAGNOSIS — K521 Toxic gastroenteritis and colitis: Secondary | ICD-10-CM | POA: Diagnosis not present

## 2022-09-21 DIAGNOSIS — Z9049 Acquired absence of other specified parts of digestive tract: Secondary | ICD-10-CM

## 2022-09-21 DIAGNOSIS — Z79624 Long term (current) use of inhibitors of nucleotide synthesis: Secondary | ICD-10-CM

## 2022-09-21 DIAGNOSIS — R000 Tachycardia, unspecified: Secondary | ICD-10-CM | POA: Diagnosis not present

## 2022-09-21 DIAGNOSIS — R9431 Abnormal electrocardiogram [ECG] [EKG]: Secondary | ICD-10-CM | POA: Diagnosis not present

## 2022-09-21 DIAGNOSIS — K2971 Gastritis, unspecified, with bleeding: Secondary | ICD-10-CM | POA: Diagnosis not present

## 2022-09-21 DIAGNOSIS — K649 Unspecified hemorrhoids: Secondary | ICD-10-CM | POA: Diagnosis not present

## 2022-09-21 DIAGNOSIS — Z79899 Other long term (current) drug therapy: Secondary | ICD-10-CM

## 2022-09-21 DIAGNOSIS — Z8249 Family history of ischemic heart disease and other diseases of the circulatory system: Secondary | ICD-10-CM

## 2022-09-21 DIAGNOSIS — F329 Major depressive disorder, single episode, unspecified: Secondary | ICD-10-CM | POA: Diagnosis not present

## 2022-09-21 DIAGNOSIS — R001 Bradycardia, unspecified: Secondary | ICD-10-CM | POA: Diagnosis not present

## 2022-09-21 DIAGNOSIS — K2991 Gastroduodenitis, unspecified, with bleeding: Secondary | ICD-10-CM | POA: Diagnosis not present

## 2022-09-21 DIAGNOSIS — E119 Type 2 diabetes mellitus without complications: Secondary | ICD-10-CM | POA: Diagnosis not present

## 2022-09-21 DIAGNOSIS — Z9989 Dependence on other enabling machines and devices: Secondary | ICD-10-CM | POA: Diagnosis not present

## 2022-09-21 DIAGNOSIS — K5730 Diverticulosis of large intestine without perforation or abscess without bleeding: Secondary | ICD-10-CM | POA: Diagnosis not present

## 2022-09-21 DIAGNOSIS — K219 Gastro-esophageal reflux disease without esophagitis: Secondary | ICD-10-CM | POA: Diagnosis present

## 2022-09-21 DIAGNOSIS — Z6822 Body mass index (BMI) 22.0-22.9, adult: Secondary | ICD-10-CM | POA: Diagnosis not present

## 2022-09-21 DIAGNOSIS — K2970 Gastritis, unspecified, without bleeding: Secondary | ICD-10-CM | POA: Diagnosis not present

## 2022-09-21 LAB — TYPE AND SCREEN
ABO/RH(D): AB POS
Antibody Screen: NEGATIVE
Unit division: 0
Unit division: 0

## 2022-09-21 LAB — BPAM RBC
Blood Product Expiration Date: 202408202359
Blood Product Expiration Date: 202408202359
ISSUE DATE / TIME: 202407290443
ISSUE DATE / TIME: 202407290824
Unit Type and Rh: 6200
Unit Type and Rh: 6200

## 2022-09-21 LAB — I-STAT CG4 LACTIC ACID, ED
Lactic Acid, Venous: 2.5 mmol/L (ref 0.5–1.9)
Lactic Acid, Venous: 0.9 mmol/L (ref 0.5–1.9)

## 2022-09-21 LAB — COMPREHENSIVE METABOLIC PANEL
ALT: 15 U/L (ref 0–44)
AST: 13 U/L — ABNORMAL LOW (ref 15–41)
Albumin: 1.9 g/dL — ABNORMAL LOW (ref 3.5–5.0)
Alkaline Phosphatase: 61 U/L (ref 38–126)
Anion gap: 11 (ref 5–15)
BUN: 19 mg/dL (ref 6–20)
CO2: 18 mmol/L — ABNORMAL LOW (ref 22–32)
Calcium: 7.8 mg/dL — ABNORMAL LOW (ref 8.9–10.3)
Chloride: 106 mmol/L (ref 98–111)
Creatinine, Ser: 1.1 mg/dL (ref 0.61–1.24)
GFR, Estimated: >60 mL/min (ref >60)
Glucose, Bld: 224 mg/dL — ABNORMAL HIGH (ref 70–99)
Potassium: 2.7 mmol/L — CL (ref 3.5–5.1)
Sodium: 135 mmol/L (ref 135–145)
Total Bilirubin: 0.3 mg/dL (ref 0.3–1.2)
Total Protein: 5.1 g/dL — ABNORMAL LOW (ref 6.5–8.1)

## 2022-09-21 LAB — HEMOGLOBIN AND HEMATOCRIT, BLOOD
HCT: 25.1 % — ABNORMAL LOW (ref 39.0–52.0)
Hemoglobin: 8 g/dL — ABNORMAL LOW (ref 13.0–17.0)

## 2022-09-21 LAB — PROTIME-INR
INR: 1.3 — ABNORMAL HIGH (ref 0.8–1.2)
Prothrombin Time: 15.9 seconds — ABNORMAL HIGH (ref 11.4–15.2)

## 2022-09-21 LAB — CBC
HCT: 25.3 % — ABNORMAL LOW (ref 39.0–52.0)
HCT: 24.7 % — ABNORMAL LOW (ref 39.0–52.0)
Hemoglobin: 7.6 g/dL — ABNORMAL LOW (ref 13.0–17.0)
Hemoglobin: 7.6 g/dL — ABNORMAL LOW (ref 13.0–17.0)
MCH: 25 pg — ABNORMAL LOW (ref 26.0–34.0)
MCH: 25.8 pg — ABNORMAL LOW (ref 26.0–34.0)
MCHC: 30 g/dL (ref 30.0–36.0)
MCHC: 30.8 g/dL (ref 30.0–36.0)
MCV: 83.2 fL (ref 80.0–100.0)
MCV: 83.7 fL (ref 80.0–100.0)
Platelets: 437 10*3/uL — ABNORMAL HIGH (ref 150–400)
Platelets: 308 10*3/uL (ref 150–400)
RBC: 3.04 MIL/uL — ABNORMAL LOW (ref 4.22–5.81)
RBC: 2.95 MIL/uL — ABNORMAL LOW (ref 4.22–5.81)
RDW: 15.5 % (ref 11.5–15.5)
RDW: 15.4 % (ref 11.5–15.5)
WBC: 22.4 10*3/uL — ABNORMAL HIGH (ref 4.0–10.5)
WBC: 5.9 10*3/uL (ref 4.0–10.5)
nRBC: 0 % (ref 0.0–0.2)
nRBC: 0 % (ref 0.0–0.2)

## 2022-09-21 LAB — CBG MONITORING, ED
Glucose-Capillary: 124 mg/dL — ABNORMAL HIGH (ref 70–99)
Glucose-Capillary: 149 mg/dL — ABNORMAL HIGH (ref 70–99)
Glucose-Capillary: 164 mg/dL — ABNORMAL HIGH (ref 70–99)
Glucose-Capillary: 167 mg/dL — ABNORMAL HIGH (ref 70–99)
Glucose-Capillary: 171 mg/dL — ABNORMAL HIGH (ref 70–99)

## 2022-09-21 LAB — BASIC METABOLIC PANEL
Anion gap: 14 (ref 5–15)
BUN: 15 mg/dL (ref 6–20)
CO2: 19 mmol/L — ABNORMAL LOW (ref 22–32)
Calcium: 7.8 mg/dL — ABNORMAL LOW (ref 8.9–10.3)
Chloride: 103 mmol/L (ref 98–111)
Creatinine, Ser: 0.92 mg/dL (ref 0.61–1.24)
GFR, Estimated: >60 mL/min (ref >60)
Glucose, Bld: 150 mg/dL — ABNORMAL HIGH (ref 70–99)
Potassium: 3.1 mmol/L — ABNORMAL LOW (ref 3.5–5.1)
Sodium: 136 mmol/L (ref 135–145)

## 2022-09-21 LAB — CULTURE, BLOOD (ROUTINE X 2)
Culture: NO GROWTH
Culture: NO GROWTH
Special Requests: ADEQUATE

## 2022-09-21 LAB — SEDIMENTATION RATE: Sed Rate: 38 mm/hr — ABNORMAL HIGH (ref 0–16)

## 2022-09-21 LAB — C-REACTIVE PROTEIN: CRP: 8 mg/dL — ABNORMAL HIGH (ref <1.0)

## 2022-09-21 LAB — MAGNESIUM: Magnesium: 1.5 mg/dL — ABNORMAL LOW (ref 1.7–2.4)

## 2022-09-21 LAB — PREPARE RBC (CROSSMATCH)

## 2022-09-21 MED ORDER — SODIUM CHLORIDE 0.9 % IV SOLN
3.0000 g | Freq: Once | INTRAVENOUS | Status: AC
  Administered 2022-09-21: 3 g via INTRAVENOUS
  Filled 2022-09-21: qty 8

## 2022-09-21 MED ORDER — SODIUM CHLORIDE 0.9 % IV SOLN
3.0000 g | Freq: Four times a day (QID) | INTRAVENOUS | Status: DC
  Administered 2022-09-21 – 2022-09-26 (×20): 3 g via INTRAVENOUS
  Filled 2022-09-21 (×20): qty 8

## 2022-09-21 MED ORDER — INSULIN ASPART 100 UNIT/ML IJ SOLN
0.0000 [IU] | INTRAMUSCULAR | Status: DC
  Administered 2022-09-21: 1 [IU] via SUBCUTANEOUS
  Administered 2022-09-21 (×2): 2 [IU] via SUBCUTANEOUS
  Administered 2022-09-22: 1 [IU] via SUBCUTANEOUS
  Administered 2022-09-22 – 2022-09-23 (×4): 3 [IU] via SUBCUTANEOUS
  Administered 2022-09-23: 2 [IU] via SUBCUTANEOUS
  Administered 2022-09-23: 1 [IU] via SUBCUTANEOUS
  Administered 2022-09-24: 2 [IU] via SUBCUTANEOUS
  Administered 2022-09-24: 1 [IU] via SUBCUTANEOUS
  Administered 2022-09-24 – 2022-09-25 (×2): 3 [IU] via SUBCUTANEOUS
  Administered 2022-09-25: 2 [IU] via SUBCUTANEOUS
  Administered 2022-09-25: 1 [IU] via SUBCUTANEOUS
  Administered 2022-09-25: 2 [IU] via SUBCUTANEOUS
  Administered 2022-09-25: 3 [IU] via SUBCUTANEOUS
  Administered 2022-09-26 (×2): 5 [IU] via SUBCUTANEOUS
  Administered 2022-09-26: 2 [IU] via SUBCUTANEOUS

## 2022-09-21 MED ORDER — LACTATED RINGERS IV BOLUS
1000.0000 mL | Freq: Once | INTRAVENOUS | Status: AC
  Administered 2022-09-21: 1000 mL via INTRAVENOUS

## 2022-09-21 MED ORDER — ALBUTEROL SULFATE (2.5 MG/3ML) 0.083% IN NEBU: 2.5000 mg | INHALATION_SOLUTION | Freq: Four times a day (QID) | RESPIRATORY_TRACT | Status: DC | PRN

## 2022-09-21 MED ORDER — METHYLPREDNISOLONE SODIUM SUCC 40 MG IJ SOLR
40.0000 mg | Freq: Every day | INTRAMUSCULAR | Status: DC
  Administered 2022-09-21 – 2022-09-26 (×6): 40 mg via INTRAVENOUS
  Filled 2022-09-21 (×6): qty 1

## 2022-09-21 MED ORDER — SODIUM CHLORIDE 0.9% IV SOLUTION: Freq: Once | INTRAVENOUS | Status: DC

## 2022-09-21 MED ORDER — PRAVASTATIN SODIUM 40 MG PO TABS
40.0000 mg | ORAL_TABLET | Freq: Every evening | ORAL | Status: DC
  Administered 2022-09-21 – 2022-09-26 (×6): 40 mg via ORAL
  Filled 2022-09-21 (×6): qty 1

## 2022-09-21 MED ORDER — IOHEXOL 350 MG/ML SOLN
100.0000 mL | Freq: Once | INTRAVENOUS | Status: AC | PRN
  Administered 2022-09-21: 100 mL via INTRAVENOUS

## 2022-09-21 MED ORDER — POTASSIUM CHLORIDE 10 MEQ/100ML IV SOLN
10.0000 meq | INTRAVENOUS | Status: AC
  Administered 2022-09-21 (×4): 10 meq via INTRAVENOUS
  Filled 2022-09-21 (×3): qty 100

## 2022-09-21 MED ORDER — TACROLIMUS 1 MG PO CAPS
2.0000 mg | ORAL_CAPSULE | Freq: Two times a day (BID) | ORAL | Status: DC
  Administered 2022-09-21 – 2022-09-26 (×10): 2 mg via ORAL
  Filled 2022-09-21 (×10): qty 2

## 2022-09-21 MED ORDER — MYCOPHENOLATE MOFETIL 250 MG PO CAPS: 250.0000 mg | ORAL_CAPSULE | Freq: Two times a day (BID) | ORAL | Status: DC

## 2022-09-21 MED ORDER — FLEET ENEMA 7-19 GM/118ML RE ENEM
1.0000 | ENEMA | Freq: Once | RECTAL | Status: AC
  Administered 2022-09-22: 1 via RECTAL
  Filled 2022-09-21: qty 1

## 2022-09-21 MED ORDER — POTASSIUM CHLORIDE 2 MEQ/ML IV SOLN
INTRAVENOUS | Status: DC
  Filled 2022-09-21 (×6): qty 1000

## 2022-09-21 MED ORDER — KCL-LACTATED RINGERS 20 MEQ/L IV SOLN
INTRAVENOUS | Status: DC
  Filled 2022-09-21: qty 1000

## 2022-09-21 NOTE — Consult Note (Addendum)
Initial Consultation Note   Patient: MARCIS PETTEYS ZOX:[id number] DOB: Sep 08, 1960 PCP: Joaquim Nam, MD DOA: 09/21/2022 DOS: the patient was seen and examined on 09/21/2022 Primary service: Ernie Avena, MD  Referring physician: Ray  Reason for consult: Colitis   Assessment/Plan: Assessment and Plan: Colitis Noted recurrent colitis with failed outpatient course of p.o. antibiotics and steroid in the setting of liver transplant White count 22.4 CT imaging with ongoing widespread Colitis since the CT last month, most pronounced in the sigmoid colon Patient tentatively pending transfer to [facility] ER team asking for general recommendations pending transfer S/p IV unasyn in the ER  [place] [facility] consulted Follow up recommendations for now Will otherwise continue to monitor pending transfer Will plan to formally admit patient if still in the ER after July 29.   Acute blood loss anemia Hemoglobin 7.6 today with baseline hemoglobin around 12-13 in the setting of colitis and bloody stools Will hold offending medications including aspirin Trend hemoglobin Transfuse for hemoglobin less than 7.  S/P AVR (aortic valve replacement) AVR with Inspiriris Resilia pericardial valve on 04/03/21  Looks to be on full dose ASA  Hold for now pending evaluation for recurrent colitis  Cardiology consult as clinically indicated    Coronary artery disease involving native coronary artery of native heart without angina pectoris Pt s/p CABG x 1 with LIMA to LAD for 80% stenosis  On high dose ASA and statin  Holding statin for now in setting of colitis and bleeding  HLD (hyperlipidemia) Resume statin as colitis improves    Liver transplant recipient [facility]) S/p liver transplant 08/2018  On cellcept and prograf  Cont home regimen pending GI evaluation    Diabetes mellitus without complication (HCC) SSI    OSA (obstructive sleep apnea) Cpap   Asthma Stable from a resp standpoint  Hold  inhalers    Essential hypertension BP stable  Titrate home regimen    Hypothyroidism Synthroid        TRH will continue to follow the patient.  If patient remains in the ER overnight without transfer to [facility], will plan to formally admit to tried hospitalist.  HPI: OTHELL MCQUEARY is a 62 y.o. male with past medical history of multiple medical issues including cirrhosis status post liver transplant July 2020, OSA on CPAP, hypertension, aortic stenosis status post aortic valve replacement, coronary artery disease, OSA on CPAP presenting with recurrent colitis, hypokalemia, acute blood loss anemia.  Patient states he was put on Ozempic approximately 2 months ago.  Reports recurrent episodes of nausea vomiting or diarrhea associate with medication.  States he has had multiple rounds of antibiotics Po steroids with persistent abdominal pain nausea and diarrhea.  Positive intermittent bloody stools.  Baseline history of liver transplant.  Has been compliant with immunosuppressive regimen including CellCept and Prograf. Noted colonoscopy in the [facility] system 09/09/22 prolapsed hemorrhoids, severe mucosal changes were found in the entire colon,  Is due for follow-up in the next month or so with [facility] liver transplant service.  No chest pain or shortness of breath.  Has had worsening weakness. Presented to the ER afebrile, systolic pressures initially in the 80s to 90s.  Now improved to the 110s.  White count 22, hemoglobin 7.6, platelets 437.  Creatinine 1.1.  Potassium 2.7.  Lactate 2.5.  CT angio with evidence of ongoing widespread colitis since the CT last month with pronounced signal in the sigmoid colon as well as small volume of simple density free fluid in the  pelvis likely reactive.  No evidence of bowel perforation obstruction or GI bleeding.  Pending formal GI evaluation.  Review of Systems: As mentioned in the history of present illness. All other systems reviewed and are negative. Past  Medical History:  Diagnosis Date   Allergy    Asthma    Bicuspid aortic valve    sees dr Tenny Craw   Cirrhosis [facility])    Coronary artery disease    DM2 (diabetes mellitus, type 2) (HCC)    Fatty liver    with h/o elevated LFT's   GERD (gastroesophageal reflux disease)    Heart murmur    Hypertension    Itching    all over last few months   Jaundice    Liver transplant recipient (HCC)    09/16/2018 at [facility]   Migraine with aura    OSA (obstructive sleep apnea) 09/14/2011   PSG 11/08/11>>AHI 31.6, SpO2 low 85%. wears CPAP, pt does not know settings   Pulmonary embolism (HCC)    2022   Thyroid disease    Past Surgical History:  Procedure Laterality Date   AORTIC VALVE REPLACEMENT N/A 04/03/2021   Procedure: AORTIC VALVE REPLACEMENT (AVR) USING 27 MM INSPIRIS RESILIA  AORTIC VALVE;  Surgeon: Alleen Borne, MD;  Location: [facility] OR;  Service: Open Heart Surgery;  Laterality: N/A;   APPENDECTOMY  11/2007   Emergency   BIOPSY THYROID  08/19/2007   Attempted, no tissue obtained   CARDIAC CATHETERIZATION  03/21/2018   CARDIOVASCULAR STRESS TEST  04/2005   Negative 06/05   CHOLECYSTECTOMY     CORONARY ARTERY BYPASS GRAFT N/A 04/03/2021   Procedure: CORONARY ARTERY BYPASS GRAFTING (CABG) x ONE ON CARDIOPULMONARY BYPASS. LIMA TO LAD;  Surgeon: Alleen Borne, MD;  Location: [facility] OR;  Service: Open Heart Surgery;  Laterality: N/A;   DOPPLER ECHOCARDIOGRAPHY  07/2002   ESOPHAGEAL BANDING N/A 05/04/2016   Procedure: ESOPHAGEAL BANDING;  Surgeon: Iva Boop, MD;  Location: [facility] ENDOSCOPY;  Service: Endoscopy;  Laterality: N/A;   ESOPHAGOGASTRODUODENOSCOPY (EGD) WITH PROPOFOL N/A 05/04/2016   Procedure: ESOPHAGOGASTRODUODENOSCOPY (EGD) WITH PROPOFOL;  Surgeon: Iva Boop, MD;  Location: [facility] ENDOSCOPY;  Service: Endoscopy;  Laterality: N/A;   LIVER TRANSPLANT     09/16/2018 at [facility]   RIGHT/LEFT HEART CATH AND CORONARY ANGIOGRAPHY N/A 12/20/2020   Procedure: RIGHT/LEFT HEART CATH AND CORONARY  ANGIOGRAPHY;  Surgeon: Kathleene Hazel, MD;  Location: [facility] INVASIVE CV LAB;  Service: Cardiovascular;  Laterality: N/A;   TEE WITHOUT CARDIOVERSION N/A 04/03/2021   Procedure: TRANSESOPHAGEAL ECHOCARDIOGRAM (TEE);  Surgeon: Alleen Borne, MD;  Location: [facility] OR;  Service: Open Heart Surgery;  Laterality: N/A;   Social History:  reports that he has never smoked. He has been exposed to tobacco smoke. He has never used smokeless tobacco. He reports that he does not drink alcohol and does not use drugs.  Allergies  Allergen Reactions   Watermelon Flavor Itching    Mouth itching    Family History  Problem Relation Age of Onset   Asthma Mother    Cancer Father        Died when pt was 63 of CA with mets, site unknown, COPD   COPD Father    Heart disease Sister        Heart stopped   Liver disease Sister        "gene" for liver disease   Colon cancer Maternal Grandmother    Heart disease Maternal Grandfather  MI, 58 YOA   Prostate cancer Maternal Uncle    Esophageal cancer Neg Hx    Rectal cancer Neg Hx    Stomach cancer Neg Hx     Prior to Admission medications   Medication Sig Start Date End Date Taking? Authorizing Provider  acetaminophen (TYLENOL) 500 MG tablet Take 500 mg by mouth every 6 (six) hours as needed for moderate pain or headache.    [provider]  albuterol (VENTOLIN HFA) 108 (90 Base) MCG/ACT inhaler Inhale 2 puffs into the lungs every 6 (six) hours as needed for wheezing or shortness of breath.    [provider]  alendronate (FOSAMAX) 70 MG tablet Take 1 tablet (70 mg total) by mouth every Sunday. Take with a full glass of water on an empty stomach. 05/25/21   Joaquim Nam, MD  aspirin EC 325 MG EC tablet Take 1 tablet (325 mg total) by mouth daily. 04/08/21   Gold, Wayne E, PA-C  azelastine (ASTELIN) 0.1 % nasal spray PLACE 2 SPRAYS INTO BOTH NOSTRILS TWICE DAILY AS NEEDED 04/26/20   Joaquim Nam, MD  calcium carbonate (TUMS - DOSED  IN MG ELEMENTAL CALCIUM) 500 MG chewable tablet Chew 1 tablet by mouth daily.    [provider]  Cholecalciferol (VITAMIN D) 50 MCG (2000 UT) tablet Take 2,000 Units by mouth daily.    [provider]  empagliflozin (JARDIANCE) 10 MG TABS tablet Take 1 tablet (10 mg total) by mouth daily before breakfast. 03/17/21   Joaquim Nam, MD  fluticasone [facility]) 50 MCG/ACT nasal spray PLACE ONE OR TWO SPRAYS INTO BOTH NOSTRILS DAILY AS NEEDED. 12/22/17   Joaquim Nam, MD  hydrocortisone cream 1 % Apply to affected area 2 times daily 08/16/22   Rexford Maus, DO  levothyroxine (SYNTHROID) 175 MCG tablet Take 1 tablet (175 mcg total) by mouth daily before breakfast. 08/13/22   Pricilla Riffle, MD  metFORMIN (GLUCOPHAGE-XR) 500 MG 24 hr tablet Take 2 tablets (1,000 mg total) by mouth every morning AND 2 tablets (1,000 mg total) at bedtime. 08/21/22   Joaquim Nam, MD  metoprolol tartrate (LOPRESSOR) 25 MG tablet Take 1 tablet (25 mg total) by mouth two (2) times a day. 08/21/22   Joaquim Nam, MD  metroNIDAZOLE (METROGEL) 1 % gel Apply 1 application topically daily as needed (rosacea).    [provider]  mycophenolate (CELLCEPT) 250 MG capsule Take 250 mg by mouth 2 (two) times daily. 06/10/20   [provider]  NON FORMULARY Pt uses a cpap nightly    [provider]  ondansetron (ZOFRAN) 4 MG tablet Take 1 tablet (4 mg total) by mouth every 6 (six) hours. 08/16/22   Rexford Maus, DO  OneTouch Delica Lancets 33G MISC USE TO CHECK SUGAR DAILY 11/01/18   Joaquim Nam, MD  [facility] ULTRA test strip USE TO CHECK SUGAR DAILY 04/26/19   Joaquim Nam, MD  pravastatin (PRAVACHOL) 40 MG tablet Take 40 mg by mouth every evening.    [provider]  sertraline (ZOLOFT) 50 MG tablet Take 1 tablet (50 mg total) by mouth daily. 08/21/22   Joaquim Nam, MD  tacrolimus (PROGRAF) 1 MG capsule Take 2 mg by mouth 2 (two) times daily. 08/01/20    [provider]    Physical Exam: Vitals:   09/21/22 1500 09/21/22 1515 09/21/22 1530 09/21/22 1545  BP: 107/68 113/73 104/71 120/75  Pulse: 62 64 (!) 57 63  Resp: 18 17 16  (!) 23  Temp:      TempSrc:      SpO2: 100% 100% 100% 100%  Weight:      Height:       Physical Exam Constitutional:      Appearance: He is normal weight.  HENT:     Head: Normocephalic.     Nose: Nose normal.     Mouth/Throat:     Mouth: Mucous membranes are moist.  Eyes:     Pupils: Pupils are equal, round, and reactive to light.  Cardiovascular:     Rate and Rhythm: Normal rate and regular rhythm.  Pulmonary:     Effort: Pulmonary effort is normal.  Abdominal:     General: Bowel sounds are normal.     Palpations: Abdomen is soft.     Comments: Positive generalized abdominal tenderness by palpation  Musculoskeletal:        General: Normal range of motion.  Skin:    General: Skin is warm.  Neurological:     General: No focal deficit present.  Psychiatric:        Mood and Affect: Mood normal.     Data Reviewed:   There are no new results to review at this time.  CT ANGIO GI BLEED CLINICAL DATA:  62 year old male with several days of bloody bowel movements. Bright red blood per rectum today. History of colitis.  EXAM: CTA ABDOMEN AND PELVIS WITHOUT AND WITH CONTRAST  TECHNIQUE: Multidetector CT imaging of the abdomen and pelvis was performed using the standard protocol during bolus administration of intravenous contrast. Multiplanar reconstructed images and MIPs were obtained and reviewed to evaluate the vascular anatomy.  RADIATION DOSE REDUCTION: This exam was performed according to the departmental dose-optimization program which includes automated exposure control, adjustment of the mA and/or kV according to patient size and/or use of iterative reconstruction technique.  CONTRAST:  OMNIPAQUE IOHEXOL 350 MG/ML SOLN  COMPARISON:  CT Abdomen and Pelvis 08/16/2022  and earlier.  FINDINGS: VASCULAR  Normal caliber abdominal aorta. Minimal Aortoiliac calcified atherosclerosis. Negative for abdominal aortic aneurysm or dissection. Major arterial branches including the celiac, SMA and IMA are patent with no significant branch atherosclerosis identified. Iliac and proximal femoral arteries are patent with minimal to mild for age atherosclerosis. And the portal venous system is patent on the final phase series 8.  Right lower quadrant staple line associated with chronic neo terminal ileum. Hyperenhancement of thick-walled sigmoid colon on the early arterial phase images (series 6, image 120). But there is no contrast extravasation into the bowel lumen identified.  Review of the MIP images confirms the above findings.  NON-VASCULAR  Lower chest: Prior sternotomy. Partially visible TAVR. Heart size is stable, within normal limits. No pericardial or pleural effusion. Negative lung bases.  Hepatobiliary: Chronic cholecystectomy. Liver appears stable, negative.  Pancreas: Negative.  Spleen: Negative.  Adrenals/Urinary Tract: Stable and negative.  Stomach/Bowel: Rectum appears within normal limits today. From the splenic flexure through the sigmoid colon the large bowel is decompressed, but also appears somewhat thick-walled and featureless (left lower quadrant series 8, image 64) similar to the CT last month. Up to mild associated mesenteric stranding in those segments, most apparent on series 5 precontrast images. No diverticula identified. The distal sigmoid colon is gas distended and appears more normal.  The transverse colon appears more normal, but the hepatic flexure has a similar featureless and thick-walled appearance (series 8, image 32). Mild if any mesenteric stranding there. There are  occasional right colon diverticula. And there is a neo terminal ileum redemonstrated with no adverse features.  No dilated small bowel.  Decompressed stomach and duodenum. No pneumoperitoneum.  Lymphatic: No lymphadenopathy.  Reproductive: Negative.  Other: There is a small volume of free fluid now in the deep pelvis greater on the left (series 8, image 72) with simple fluid density. This is new from last month.  Musculoskeletal: Median sternotomy. Flowing lower thoracic endplate osteophytes resulting and chronic lower thoracic spinal ankylosis. No acute osseous abnormality identified.  IMPRESSION: 1. Evidence of ongoing widespread Colitis since the CT last month, most pronounced in the sigmoid colon. New small volume of simple density free fluid in the pelvis, likely reactive. No evidence of bowel perforation or obstruction. Negative for active GI bleeding by CTA. 2. No other acute or inflammatory process identified.  Electronically Signed   By: Odessa Fleming M.D.   On: 09/21/2022 05:47  Lab Results  Component Value Date   WBC 22.4 (H) 09/21/2022   HGB 7.6 (L) 09/21/2022   HCT 25.3 (L) 09/21/2022   MCV 83.2 09/21/2022   PLT 437 (H) 09/21/2022   Last metabolic panel Lab Results  Component Value Date   GLUCOSE 224 (H) 09/21/2022   NA 135 09/21/2022   K 2.7 (LL) 09/21/2022   CL 106 09/21/2022   CO2 18 (L) 09/21/2022   BUN 19 09/21/2022   CREATININE 1.10 09/21/2022   GFRNONAA >60 09/21/2022   CALCIUM 7.8 (L) 09/21/2022   PROT 5.1 (L) 09/21/2022   ALBUMIN 1.9 (L) 09/21/2022   LABGLOB 2.8 08/06/2021   AGRATIO 1.7 08/06/2021   BILITOT 0.3 09/21/2022   ALKPHOS 61 09/21/2022   AST 13 (L) 09/21/2022   ALT 15 09/21/2022   ANIONGAP 11 09/21/2022      Family Communication: Family and Dr. Rosalia Hammers made aware of consult/admission plan.  Primary team communication:  see above  Thank you very much for involving [place] in the care of your patient.  Author: Floydene Flock, MD 09/21/2022 4:46 PM  For on call review [email or web address].

## 2022-09-21 NOTE — Assessment & Plan Note (Signed)
Pt s/p CABG x 1 with LIMA to LAD for 80% stenosis  On high dose ASA and statin  Holding statin for now in setting of colitis and bleeding

## 2022-09-21 NOTE — ED Notes (Signed)
Patient ambulated to the bathroom.

## 2022-09-21 NOTE — Progress Notes (Signed)
Pharmacy Antibiotic Note  [name] is a 62 y.o. male admitted on 09/21/2022 with intra-abdominal infection.  Pharmacy has been consulted for Unasyn dosing.  CrCl 80-90 mL/min  Plan: Unasyn 3g Q6H Pharmacy will sign off and monitor peripherally for dose adjustments  Height: 5\' 8"  (172.7 cm) Weight: 69.9 kg (154 lb) IBW/kg (Calculated) : 68.4  Temp (24hrs), Avg:98.3 F (36.8 C), Min:98 F (36.7 C), Max:99 F (37.2 C)  Recent Labs  Lab 09/21/22 0326 09/21/22 0502 09/21/22 0910 09/21/22 1635  WBC 22.4*  --   --  5.9  CREATININE 1.10  --   --  0.92  LATICACIDVEN  --  2.5* 0.9  --     Estimated Creatinine Clearance: 82.6 mL/min (by C-G formula based on SCr of 0.92 mg/dL).    Allergies  Allergen Reactions   Watermelon Flavor Itching    Mouth itching   Thank you for allowing pharmacy to be a part of this patient's care.  Eldridge Scot, PharmD Clinical Pharmacist 09/21/2022, 8:30 PM

## 2022-09-21 NOTE — Assessment & Plan Note (Signed)
AVR with Inspiriris Resilia pericardial valve on 04/03/21  Looks to be on full dose ASA  Hold for now pending evaluation for recurrent colitis with acute blood loss anemia

## 2022-09-21 NOTE — Assessment & Plan Note (Addendum)
BP stable  Titrate home regimen

## 2022-09-21 NOTE — Assessment & Plan Note (Addendum)
S/p liver transplant 08/2018 for crytogenic cirrhosis  -LFTs within normal limits On cellcept and prograf - Cellcept was discontinued on 7/27 by [facility] due to concern for MMF-induced colitis Continue Prograf

## 2022-09-21 NOTE — Assessment & Plan Note (Signed)
Hemoglobin 7.6 today with baseline hemoglobin around 12-13 in the setting of colitis and bloody stools Will hold offending medications including aspirin Trend hemoglobin Transfuse for hemoglobin less than 7.

## 2022-09-21 NOTE — Assessment & Plan Note (Addendum)
-    continue Synthroid. 

## 2022-09-21 NOTE — Assessment & Plan Note (Signed)
Stable from a resp standpoint  Hold inhalers

## 2022-09-21 NOTE — Assessment & Plan Note (Signed)
Cpap

## 2022-09-21 NOTE — Consult Note (Addendum)
Consultation  Referring Provider:   [facility] Primary Care Physician:  Joaquim Nam, MD Primary Gastroenterologist:  Dr. Christella Hartigan       Reason for Consultation:    Colitis in setting of liver transplant  DOA: 09/21/2022         Hospital Day: 1         HPI:   [name] is a 62 y.o. male with past medical history significant for type 2 diabetes on Ozempic,.   2014 Colon unremarkable Dr. Christella Hartigan Referred to [facility] transplant for IgG4 autoimmune cholangiopathy and cirrhosis complicated by portal hypertension/HE, with [facility], has not been seen in our office since 2019. Patient was started on CellCept to 15 capsule twice daily, Prograf 2 mg twice daily after liver transplant 09/16/2018. 03/30/2022 MRI abdomen pelvis with without contrast for history of HCC and area of hyperenhancement liver segment showed post liver transplant, mild lobular contour of the liver hepatic steatosis ill-defined enhancement left hepatic lobe, 1.1 cm area hepatic segment 6 1.6 cm no washout or capsular enhancement.  No biliary ductal dilation normal pancreas, splenomegaly 13.9 no bowel obstruction no acute processes no inflammation seen at that time.   09/04/2022 AFP tumor marker unremarkable 7/6 tacrolimus trough 5.1 Patient began to have loose bloody stools in June, CT abdomen pelvis with contrast 08/16/22 showed moderate severity colitis descending sigmoid colon, negative C. difficile and GI pathogen at that time. Patient had another negative C. Difficile and GI pathogen 09/11/2020. HSV PCR negative, EBV PCR negative, CMV PCR negative. 7/23 iron 18, saturation ratio 14, CBC 12.2 at the time. 09/09/2022 colonoscopy hematochezia, diarrhea and colitis seen on CT in setting of immunosuppression medications showed prolapsed hemorrhoids, severe mucosal changes in the entire examined colon Biopsy showed severely active colitis with ulcerations differential includes infectious, drug effect inflammatory bowel disease CMV  immunostain shows only rare positive cells lamina propria HSV 1 and 2 immunostain negative  Patient started on prednisone for possible IBD with plan of repeat flex sig 6 to 8 weeks. Patient started on prednisone taper 40 mg daily for 2 weeks, 30 mg for 7 days from milligrams for 7 days 10 mg for 7 days, Imodium as needed.  Started on Bactrim every Monday Wednesday Friday.  Daughter [name] and wife [name] are both in the room and provides some of the history. Patient's states diarrhea started in end of May/June when he also started Ozempic, and then he began to have intermittent bright red blood and dark red blood in the stool starting in June, went to urgent care June 23 due to having 10 bowel movements daily, worsening blood, no mucus.  With so she abdominal cramping.  Then had negative GI and C. difficile panels, colonoscopy 7/17 showed active colitis, started prednisone 40 mg taper on the 23rd, states he was feeling better had more energy, with was eating better went from 10 to 4-5 bowel movements daily and having less blood until last night he had 3 episodes of large-volume bloody stools with associated nausea and syncope/presyncope. He has had some chills but no fever. Patient's last dose of CellCept was 2 days ago on Saturday, [facility] told him to stop this.  He is continuing on the Prograf. Patient states his sugars have been elevated secondary to prednisone use and steroids. Denies chest pain, shortness of breath, dizziness.  Presents to the ER with rectal bleeding.  Work up notable for CTA negative for acute bleeding but did show evidence of ongoing  widespread colitis most pronounced sigmoid colon, new volume simple density free fluid in pelvis likely reactive no evidence of bowel perforation or obstruction.  No acute inflammatory process. 7/23 iron 18, saturation ratio 14, CBC 12.2 at the time, hemoglobin in the ER was 7.6, decreased 4-5 points, with associated hypotension. No elevated BUN. INR  1.3 probably Pending blood culture, no growth at 12 hours.   Abnormal ED labs: Abnormal Labs Reviewed  COMPREHENSIVE METABOLIC PANEL - Abnormal; Notable for the following components:      Result Value   Potassium 2.7 (*)    CO2 18 (*)    Glucose, Bld 224 (*)    Calcium 7.8 (*)    Total Protein 5.1 (*)    Albumin 1.9 (*)    AST 13 (*)    All other components within normal limits  CBC - Abnormal; Notable for the following components:   WBC 22.4 (*)    RBC 3.04 (*)    Hemoglobin 7.6 (*)    HCT 25.3 (*)    MCH 25.0 (*)    Platelets 437 (*)    All other components within normal limits  PROTIME-INR - Abnormal; Notable for the following components:   Prothrombin Time 15.9 (*)    INR 1.3 (*)    All other components within normal limits  I-STAT CG4 LACTIC ACID, ED - Abnormal; Notable for the following components:   Lactic Acid, Venous 2.5 (*)    All other components within normal limits    Past Medical History:  Diagnosis Date   Allergy    Asthma    Bicuspid aortic valve    sees dr Tenny Craw   Cirrhosis [facility])    Coronary artery disease    DM2 (diabetes mellitus, type 2) (HCC)    Fatty liver    with h/o elevated LFT's   GERD (gastroesophageal reflux disease)    Heart murmur    Hypertension    Itching    all over last few months   Jaundice    Liver transplant recipient (HCC)    09/16/2018 at [facility]   Migraine with aura    OSA (obstructive sleep apnea) 09/14/2011   PSG 11/08/11>>AHI 31.6, SpO2 low 85%. wears CPAP, pt does not know settings   Pulmonary embolism (HCC)    2022   Thyroid disease     Surgical History:  He  has a past surgical history that includes Cardiovascular stress test (04/2005); doppler echocardiography (07/2002); Biopsy thyroid (08/19/2007); Appendectomy (11/2007); Esophagogastroduodenoscopy (egd) with propofol (N/A, 05/04/2016); esophageal banding (N/A, 05/04/2016); Cardiac catheterization (03/21/2018); Liver transplant; RIGHT/LEFT HEART CATH AND CORONARY  ANGIOGRAPHY (N/A, 12/20/2020); Cholecystectomy; Aortic valve replacement (N/A, 04/03/2021); Coronary artery bypass graft (N/A, 04/03/2021); and TEE without cardioversion (N/A, 04/03/2021). Family History:  His family history includes Asthma in his mother; COPD in his father; Cancer in his father; Colon cancer in his maternal grandmother; Heart disease in his maternal grandfather and sister; Liver disease in his sister; Prostate cancer in his maternal uncle. Social History:   reports that he has never smoked. He has been exposed to tobacco smoke. He has never used smokeless tobacco. He reports that he does not drink alcohol and does not use drugs.  Prior to Admission medications   Medication Sig Start Date End Date Taking? Authorizing Provider  acetaminophen (TYLENOL) 500 MG tablet Take 500 mg by mouth every 6 (six) hours as needed for moderate pain or headache.    [provider]  albuterol (VENTOLIN HFA) 108 (90 Base)  MCG/ACT inhaler Inhale 2 puffs into the lungs every 6 (six) hours as needed for wheezing or shortness of breath.    [provider]  alendronate (FOSAMAX) 70 MG tablet Take 1 tablet (70 mg total) by mouth every Sunday. Take with a full glass of water on an empty stomach. 05/25/21   Joaquim Nam, MD  aspirin EC 325 MG EC tablet Take 1 tablet (325 mg total) by mouth daily. 04/08/21   Gold, Wayne E, PA-C  azelastine (ASTELIN) 0.1 % nasal spray PLACE 2 SPRAYS INTO BOTH NOSTRILS TWICE DAILY AS NEEDED 04/26/20   Joaquim Nam, MD  calcium carbonate (TUMS - DOSED IN MG ELEMENTAL CALCIUM) 500 MG chewable tablet Chew 1 tablet by mouth daily.    [provider]  Cholecalciferol (VITAMIN D) 50 MCG (2000 UT) tablet Take 2,000 Units by mouth daily.    [provider]  empagliflozin (JARDIANCE) 10 MG TABS tablet Take 1 tablet (10 mg total) by mouth daily before breakfast. 03/17/21   Joaquim Nam, MD  fluticasone [facility]) 50 MCG/ACT nasal spray PLACE ONE OR TWO  SPRAYS INTO BOTH NOSTRILS DAILY AS NEEDED. 12/22/17   Joaquim Nam, MD  hydrocortisone cream 1 % Apply to affected area 2 times daily 08/16/22   Rexford Maus, DO  levothyroxine (SYNTHROID) 175 MCG tablet Take 1 tablet (175 mcg total) by mouth daily before breakfast. 08/13/22   Pricilla Riffle, MD  metFORMIN (GLUCOPHAGE-XR) 500 MG 24 hr tablet Take 2 tablets (1,000 mg total) by mouth every morning AND 2 tablets (1,000 mg total) at bedtime. 08/21/22   Joaquim Nam, MD  metoprolol tartrate (LOPRESSOR) 25 MG tablet Take 1 tablet (25 mg total) by mouth two (2) times a day. 08/21/22   Joaquim Nam, MD  metroNIDAZOLE (METROGEL) 1 % gel Apply 1 application topically daily as needed (rosacea).    [provider]  mycophenolate (CELLCEPT) 250 MG capsule Take 250 mg by mouth 2 (two) times daily. 06/10/20   [provider]  NON FORMULARY Pt uses a cpap nightly    [provider]  ondansetron (ZOFRAN) 4 MG tablet Take 1 tablet (4 mg total) by mouth every 6 (six) hours. 08/16/22   Rexford Maus, DO  OneTouch Delica Lancets 33G MISC USE TO CHECK SUGAR DAILY 11/01/18   Joaquim Nam, MD  [facility] ULTRA test strip USE TO CHECK SUGAR DAILY 04/26/19   Joaquim Nam, MD  pravastatin (PRAVACHOL) 40 MG tablet Take 40 mg by mouth every evening.    [provider]  sertraline (ZOLOFT) 50 MG tablet Take 1 tablet (50 mg total) by mouth daily. 08/21/22   Joaquim Nam, MD  tacrolimus (PROGRAF) 1 MG capsule Take 2 mg by mouth 2 (two) times daily. 08/01/20   [provider]    Current Facility-Administered Medications  Medication Dose Route Frequency Provider Last Rate Last Admin   0.9 %  sodium chloride infusion (Manually program via Guardrails IV Fluids)   Intravenous Once Gilda Crease, MD   Held at 09/21/22 0500   Current Outpatient Medications  Medication Sig Dispense Refill   acetaminophen (TYLENOL) 500 MG tablet Take 500 mg by mouth every 6  (six) hours as needed for moderate pain or headache.     albuterol (VENTOLIN HFA) 108 (90 Base) MCG/ACT inhaler Inhale 2 puffs into the lungs every 6 (six) hours as needed for wheezing or shortness of breath.     alendronate (FOSAMAX) 70  MG tablet Take 1 tablet (70 mg total) by mouth every Sunday. Take with a full glass of water on an empty stomach. 13 tablet 3   aspirin EC 325 MG EC tablet Take 1 tablet (325 mg total) by mouth daily.     azelastine (ASTELIN) 0.1 % nasal spray PLACE 2 SPRAYS INTO BOTH NOSTRILS TWICE DAILY AS NEEDED 30 mL 1   calcium carbonate (TUMS - DOSED IN MG ELEMENTAL CALCIUM) 500 MG chewable tablet Chew 1 tablet by mouth daily.     Cholecalciferol (VITAMIN D) 50 MCG (2000 UT) tablet Take 2,000 Units by mouth daily.     empagliflozin (JARDIANCE) 10 MG TABS tablet Take 1 tablet (10 mg total) by mouth daily before breakfast. 30 tablet 5   fluticasone (FLONASE) 50 MCG/ACT nasal spray PLACE ONE OR TWO SPRAYS INTO BOTH NOSTRILS DAILY AS NEEDED. 48 g 3   hydrocortisone cream 1 % Apply to affected area 2 times daily 15 g 0   levothyroxine (SYNTHROID) 175 MCG tablet Take 1 tablet (175 mcg total) by mouth daily before breakfast. 90 tablet 1   metFORMIN (GLUCOPHAGE-XR) 500 MG 24 hr tablet Take 2 tablets (1,000 mg total) by mouth every morning AND 2 tablets (1,000 mg total) at bedtime. 360 tablet 3   metoprolol tartrate (LOPRESSOR) 25 MG tablet Take 1 tablet (25 mg total) by mouth two (2) times a day. 180 tablet 1   metroNIDAZOLE (METROGEL) 1 % gel Apply 1 application topically daily as needed (rosacea).     mycophenolate (CELLCEPT) 250 MG capsule Take 250 mg by mouth 2 (two) times daily.     NON FORMULARY Pt uses a cpap nightly     ondansetron (ZOFRAN) 4 MG tablet Take 1 tablet (4 mg total) by mouth every 6 (six) hours. 12 tablet 0   OneTouch Delica Lancets 33G MISC USE TO CHECK SUGAR DAILY 100 each 3   ONETOUCH ULTRA test strip USE TO CHECK SUGAR DAILY 100 strip 0   pravastatin  (PRAVACHOL) 40 MG tablet Take 40 mg by mouth every evening.     sertraline (ZOLOFT) 50 MG tablet Take 1 tablet (50 mg total) by mouth daily. 90 tablet 3   tacrolimus (PROGRAF) 1 MG capsule Take 2 mg by mouth 2 (two) times daily.      Allergies as of 09/21/2022 - Review Complete 09/21/2022  Allergen Reaction Noted   Watermelon flavor Itching 04/29/2016    Review of Systems:    Constitutional: No weight loss, fever, chills, weakness or fatigue HEENT: Eyes: No change in vision               Ears, Nose, Throat:  No change in hearing or congestion Skin: No rash or itching Cardiovascular: No chest pain, chest pressure or palpitations   Respiratory: No SOB or cough Gastrointestinal: See HPI and otherwise negative Genitourinary: No dysuria or change in urinary frequency Neurological: No headache, dizziness or syncope Musculoskeletal: No new muscle or joint pain Hematologic: No bleeding or bruising Psychiatric: No history of depression or anxiety     Physical Exam:  Vital signs in last 24 hours: Temp:  [98 F (36.7 C)-99 F (37.2 C)] 98.4 F (36.9 C) (07/29 1035) Pulse Rate:  [59-77] 62 (07/29 1445) Resp:  [9-22] 18 (07/29 1445) BP: (83-141)/(51-74) 110/69 (07/29 1445) SpO2:  [94 %-100 %] 100 % (07/29 1445) Weight:  [69.9 kg] 69.9 kg (07/29 0322)   Last BM recorded by nurses in past 5 days No data recorded  General:  Pleasant, well developed male in no acute distress Head:  Normocephalic and atraumatic. Eyes: sclerae anicteric,conjunctive pale  Heart:  regular rate and rhythm, no murmurs or gallops Pulm: Clear anteriorly; no wheezing Abdomen:  Soft, Non-distended AB, Active bowel sounds. mild tenderness in the lower abdomen. Without guarding and Without rebound, No organomegaly appreciated. Extremities:  Without edema. Msk:  Symmetrical without gross deformities. Peripheral pulses intact.  Neurologic:  Alert and  oriented x4;  No focal deficits.  Skin:   Dry and intact without  significant lesions or rashes. Psychiatric:  Cooperative. Normal mood and affect.  LAB RESULTS: Recent Labs    09/21/22 0326  WBC 22.4*  HGB 7.6*  HCT 25.3*  PLT 437*   BMET Recent Labs    09/21/22 0326  NA 135  K 2.7*  CL 106  CO2 18*  GLUCOSE 224*  BUN 19  CREATININE 1.10  CALCIUM 7.8*   LFT Recent Labs    09/21/22 0326  PROT 5.1*  ALBUMIN 1.9*  AST 13*  ALT 15  ALKPHOS 61  BILITOT 0.3   PT/INR Recent Labs    09/21/22 0326  LABPROT 15.9*  INR 1.3*    STUDIES: CT ANGIO GI BLEED  Result Date: 09/21/2022 CLINICAL DATA:  62 year old male with several days of bloody bowel movements. Bright red blood per rectum today. History of colitis. EXAM: CTA ABDOMEN AND PELVIS WITHOUT AND WITH CONTRAST TECHNIQUE: Multidetector CT imaging of the abdomen and pelvis was performed using the standard protocol during bolus administration of intravenous contrast. Multiplanar reconstructed images and MIPs were obtained and reviewed to evaluate the vascular anatomy. RADIATION DOSE REDUCTION: This exam was performed according to the departmental dose-optimization program which includes automated exposure control, adjustment of the mA and/or kV according to patient size and/or use of iterative reconstruction technique. CONTRAST:  OMNIPAQUE IOHEXOL 350 MG/ML SOLN COMPARISON:  CT Abdomen and Pelvis 08/16/2022 and earlier. FINDINGS: VASCULAR Normal caliber abdominal aorta. Minimal Aortoiliac calcified atherosclerosis. Negative for abdominal aortic aneurysm or dissection. Major arterial branches including the celiac, SMA and IMA are patent with no significant branch atherosclerosis identified. Iliac and proximal femoral arteries are patent with minimal to mild for age atherosclerosis. And the portal venous system is patent on the final phase series 8. Right lower quadrant staple line associated with chronic neo terminal ileum. Hyperenhancement of thick-walled sigmoid colon on the early  arterial phase images (series 6, image 120). But there is no contrast extravasation into the bowel lumen identified. Review of the MIP images confirms the above findings. NON-VASCULAR Lower chest: Prior sternotomy. Partially visible TAVR. Heart size is stable, within normal limits. No pericardial or pleural effusion. Negative lung bases. Hepatobiliary: Chronic cholecystectomy. Liver appears stable, negative. Pancreas: Negative. Spleen: Negative. Adrenals/Urinary Tract: Stable and negative. Stomach/Bowel: Rectum appears within normal limits today. From the splenic flexure through the sigmoid colon the large bowel is decompressed, but also appears somewhat thick-walled and featureless (left lower quadrant series 8, image 64) similar to the CT last month. Up to mild associated mesenteric stranding in those segments, most apparent on series 5 precontrast images. No diverticula identified. The distal sigmoid colon is gas distended and appears more normal. The transverse colon appears more normal, but the hepatic flexure has a similar featureless and thick-walled appearance (series 8, image 32). Mild if any mesenteric stranding there. There are occasional right colon diverticula. And there is a neo terminal ileum redemonstrated with no adverse features. No dilated small bowel. Decompressed stomach and duodenum. No pneumoperitoneum.  Lymphatic: No lymphadenopathy. Reproductive: Negative. Other: There is a small volume of free fluid now in the deep pelvis greater on the left (series 8, image 72) with simple fluid density. This is new from last month. Musculoskeletal: Median sternotomy. Flowing lower thoracic endplate osteophytes resulting and chronic lower thoracic spinal ankylosis. No acute osseous abnormality identified. IMPRESSION: 1. Evidence of ongoing widespread Colitis since the CT last month, most pronounced in the sigmoid colon. New small volume of simple density free fluid in the pelvis, likely reactive. No  evidence of bowel perforation or obstruction. Negative for active GI bleeding by CTA. 2. No other acute or inflammatory process identified. Electronically Signed   By: Odessa Fleming M.D.   On: 09/21/2022 05:47      Impression    Rectal bleeding with colitis seen on CT in setting of immunosuppression specifically CellCept for previous liver transplant 2020 Unremarkable colonoscopy 2014 Colitis seen on CT abdomen pelvis 6/23 and again on recent CTA Negative C. difficile and GI pathogen panel in June as well as most recently as 09/14/2022 09/09/2022 colonoscopy showed severe colitis throughout colon biopsy showed severely active colitis with ulcerations differential includes infectious/drug effect inflammatory bowel disease.  CMV immunostain shows rare positive for lamina propria HSV PCR negative, EBV PCR negative, CMV PCR negative.  Patient started on prednisone taper and Bactrim Monday Wednesday Friday after colonoscopy results -Patient pending bed transfer to [facility] however no bed available at this time -Most likely this represents a colitis from CellCept with previous normal colonoscopies. -This was send recall flex sig 6 weeks after being on prednisone  Leukocytosis In setting of immunosuppression/prednisone use Patient afebrile, did have some hypotension on arrival responded well to IVF. Could be from prednisone use but in setting of immunosuppression pending blood cultures, no growth for 12 hours -On Unasyn  Iron deficiency anemia Recently had IDA 7/23, likely secondary to colitis -Monitor CBC, transfuse if less than 7 -Consider iron infusion if this has not been done  Status post liver transplant 2020 for history of IgG4 autoimmune cholangiopathy and cirrhosis with [facility] 03/30/2022 MRI abdomen pelvis with and without showed mild lobular contour of the liver hepatic steatosis ill-defined enhancement left hepatic lobe, 1.1 cm area hepatic segment 6 1.6 cm no washout or capsular enhancement.  No  biliary ductal dilation normal pancreas, splenomegaly 13.9 no bowel obstruction no acute processes no inflammation seen at that time.   09/04/2022 AFP tumor marker unremarkable INR 1.3  Type 2 diabetes Has been on prednisone, monitor sugars  Hypokalemia Replete potassium  Active Problems:   * No active hospital problems. *    LOS: 0 days     Plan   62 year old male with history of liver transplant 2020 on CellCept and Prograf developing cold mitis 07/2022 multiple negative C. difficile and GI pathogen panels, 7/17 colonoscopy with active focal colitis, some CMV on cells, not treated presents with 3 episodes of profuse hematochezia, Hgb dropped from 12 on 7/23 down to 7.6 with associated hypotension, lactic acidosis, leukocytosis and hypokalemia.  Colitis likely secondary to CellCept which was stopped 7/27, continue to hold Has been on prednisone since 7/23 with some improvement of symptoms but now with hematochezia with HD instablity 7/17 colonoscopy with active focal colitis and some CMV seen Will plan on repeat flex sigmoidoscopy with EGD to evaluate response to prednisone as well as reevaluate for CMV, evaluate for possible upper GI source with history of cirrhosis/prednisone use.  Scheudled with Dr. Leonides Schanz 07/30 enema 1-2 hours prior.  We have discussed the risks of bleeding, infection, perforation, medication reactions, and remote risk of death associated with Flex sigmoidoscopy and EGD. All questions were answered and the patient acknowledges these risk and wishes to proceed.  -Will start patient on IV steroids for colitis -Check sed rate, CRP -Continue to monitor CBC, transfuse greater than 7, recheck every 6 hours -Replete potassium -Continue Unasyn -Monitor INR  Thank you for your kind consultation, we will continue to follow.   Doree Albee  09/21/2022, 3:17 PM

## 2022-09-21 NOTE — ED Triage Notes (Signed)
Pt came in from home. Hx of colitus.  Bloody bm for a few days. Today bright red blood only no feces for last few BM. Pt reports passing out and falling 3x today. Pt denies cp, N/V.  Bp 60/30 initial when ems arrived. pt has a hx of a liver transplant and valve replacement.  Last bp 80/54 after 500 ml of fluid.  Hr 70 Spo2 96

## 2022-09-21 NOTE — Assessment & Plan Note (Signed)
SSI

## 2022-09-21 NOTE — ED Notes (Signed)
[facility] Transfer called for update ([phone]);patient is on wait list

## 2022-09-21 NOTE — Unmapped (Signed)
I was immediately available via phone/pager or present on site.  I reviewed and discussed the case with the fellow, but did not see the patient.  I agree with the assessment and plan as documented in the fellow's note. Trystian Crisanto Jin Avish Torry, MD

## 2022-09-21 NOTE — Unmapped (Signed)
Medicine Access Physician (MAP): Transfer Center Request Note    Requesting Physician: Dr. Blinda Leatherwood    Requesting [facility]: [facility]    Requesting Service: Emergency Medicine    Reason for Transfer: GI, Hepatology    Brief Hospital Course: 62yo male with diabetes, aortic valve stenosis, HTN, and history of liver transplant in 08/2018 for cryptogenic cirrhosis.   He began  to have diarrhea after starting Ozempic in April 2024. Evaluated in transplant clinic recently.  GI pathogen stool studies negative at this point. Cdiff negative. Colonoscopy with ulcerations. He has lost around 50 lbs. Metformin on hold. EBV and CMV negative.    Now presented to [facility] with bloody diarrhea.  Hgb just above 7.  Initially with signs of shock and systolic pressures in the 60s but has responded well to initial resuscitation and blood transfusion.  No episodes of bloody bowel movements while in the ED there.    Reached out to [facility] hepatology to make them aware but have not heard back yet.       COVID19 Concerns: None.    Plan Upon Arrival: Discuss with hepatology    Bed Type: Floor  Are there any clinical exclusion criteria as of the current date, as defined in the [facility]' Clinical Capability Matrix, to providing the patient's clinical care at [facility]? If yes, please indicate the exclusion criteria: YES, GI Hepatology      Accepting Service: Appropriate for medicine services as determined by [facility] for regionalization.    Imaging Needed:  No.    [employer] Affiliate:  No.  Reminded facility to provide imaging with transfer paperwork.  (What is this?)    Please check under Care Everywhere and/or External Records in the patient's Media Tab to see if a discharge summary has been previously sent electronically.      Once the patient's quaternary care center evaluation and treatment are completed, the requesting Physician and Hospital have NOT agreed to accept the patient on back transfer if the patient requires ongoing hospitalization that does not require continued quaternary level care at [facility].

## 2022-09-21 NOTE — Unmapped (Signed)
Late Entry: Contacted patient by phone in preparation for his annual liver txp visit, since this [facility] would not be able to attend the appt.    For background, patient contacted [facility] in the last 2 wks, reporting that since he was started on GLP-1 in June, he started having significant diarrhea and weight loss but continued med for 5-6 doses. Thus, expedited colonoscopy done on  7/17 with finding of severe colitis with ulceration. Tissue path pending,  as well as fecal elastase, CDiff and stool pathogen w/up.    Today (7/23) patient reports that his diarrhea has waned some in the last few days, estimating about 5 daily with cramping and noted some blood in his stool once over the weekend. He was seen for this in his local ED and treated with 8 days of abx. He denies fever, chills, n/v/chest pain/sob/swelling to LE or issues with hernias. He endorses increased weakness, and hgb has declined from 14.5 in May to 11.9 on 7/12. Weight loss of 55# reported, but will weigh in clinic today. He reports he has been working on maintaining good hydration throughout this protracted episode of diarrhea and that he has taken his IS meds regularly. Med list reviewed. His metformin was held and patient is not testing glucose regularly, but states it has ranged from ~110-170 when he does. He has not started the KCl ordered by NP Harms, since he has not yet picked it up. He has not been taking the ferocon regularly as prescribed either. ASA is taken daily per his cardiologist and reports he has f/up appt with him next month. Anthony Alvarado states he has regular f/up with his pcp and has been seen there recently for the diarrhea and secured appt with [facility] in Nov. NP Lindell Spar working with GI to move that appt up. He has not been seen by dermatology this year, so encouraged he be seen locally for total body surveillance. For Retreat score of 1, patient will continue to have q 6 mos AFPs, last on 7/12 was WNL. He is no longer interested in covid vaccines, but encouraged him to continue with seasonal flu vaccines. He had the 1st HAV and HBV vaccines last year, so encouraged him to repeat 2nd doses at this visit. Patient verbalized understanding of all discussed. All information shared with NP in prep for visit. Treatment for diarrhea pending pathology of tissue collection.

## 2022-09-21 NOTE — Assessment & Plan Note (Signed)
Noted recurrent colitis with failed outpatient course of p.o. antibiotics and steroid in the setting of liver transplant White count 22.4 CT imaging with ongoing widespread Colitis since the CT last month, most pronounced in the sigmoid colon Patient tentatively pending transfer to [facility] ER team asking for general recommendations pending transfer S/p IV unasyn in the ER  [facility] consulted Follow up recommendations for now Will otherwise continue to monitor pending transfer Will plan to formally admit patient if still in the ER after July 29.

## 2022-09-21 NOTE — ED Provider Notes (Addendum)
  Physical Exam  BP (!) 96/59   Pulse 70   Temp 98.2 F (36.8 C) (Oral)   Resp 17   Ht 1.727 m (5\' 8" )   Wt 69.9 kg   SpO2 100%   BMI 23.42 kg/m   Physical Exam  Procedures  Procedures  ED Course / MDM    Medical Decision Making Amount and/or Complexity of Data Reviewed Labs: ordered. Radiology: ordered.  Risk Prescription drug management.   62 yo male ho liver tx, presents yesterday with rectal bleeding.  Recent colonoscopy with colitis.  Patient on immunosuppresants, prednisone. CTA without acute bleeding.  Patient was anemic. Transfused with 2 units prbc Unasyn iv BP soft here Dr. Blinda Leatherwood d.w. hospitalist, Dr.  Sandy Salaam at [facility] and patient accepted pending bed assignment. 9:17 AM Patient resting comfortably in bed without complaints at this time Blood is transfusing Awaiting callback from [facility] with bedside Vitals:   09/21/22 0855 09/21/22 0900  BP: 95/61 91/61  Pulse: 69 69  Resp: 12 16  Temp: 99 F (37.2 C)   SpO2: 100% 100%    Multiple calls placed to [facility], no bed available.  Will consult medicine for admission here pending transfer. Discussed with Dr. Alvester Morin and with [facility]. Hospitalist team has seen patient and will be sorters and round in the morning.  If he continues to not get a hospital bed at [facility] they will admit here. Repeat labs are pending at this time. Care discussed with Dr. Hart Rochester who is aware of patient's presence in ED      Margarita Grizzle, MD 09/21/22 [id number]    Margarita Grizzle, MD 09/21/22 [phone]

## 2022-09-21 NOTE — Assessment & Plan Note (Signed)
Resume statin as colitis improves

## 2022-09-21 NOTE — H&P (View-Only) (Signed)
Consultation  Referring Provider:   [facility] Primary Care Physician:  Joaquim Nam, MD Primary Gastroenterologist:  Dr. Christella Hartigan       Reason for Consultation:    Colitis in setting of liver transplant  DOA: 09/21/2022         Hospital Day: 1         HPI:   [name] is a 62 y.o. male with past medical history significant for type 2 diabetes on Ozempic,.   2014 Colon unremarkable Dr. Christella Hartigan Referred to [facility] transplant for IgG4 autoimmune cholangiopathy and cirrhosis complicated by portal hypertension/HE, with [facility], has not been seen in our office since 2019. Patient was started on CellCept to 15 capsule twice daily, Prograf 2 mg twice daily after liver transplant 09/16/2018. 03/30/2022 MRI abdomen pelvis with without contrast for history of HCC and area of hyperenhancement liver segment showed post liver transplant, mild lobular contour of the liver hepatic steatosis ill-defined enhancement left hepatic lobe, 1.1 cm area hepatic segment 6 1.6 cm no washout or capsular enhancement.  No biliary ductal dilation normal pancreas, splenomegaly 13.9 no bowel obstruction no acute processes no inflammation seen at that time.   09/04/2022 AFP tumor marker unremarkable 7/6 tacrolimus trough 5.1 Patient began to have loose bloody stools in June, CT abdomen pelvis with contrast 08/16/22 showed moderate severity colitis descending sigmoid colon, negative C. difficile and GI pathogen at that time. Patient had another negative C. Difficile and GI pathogen 09/11/2020. HSV PCR negative, EBV PCR negative, CMV PCR negative. 7/23 iron 18, saturation ratio 14, CBC 12.2 at the time. 09/09/2022 colonoscopy hematochezia, diarrhea and colitis seen on CT in setting of immunosuppression medications showed prolapsed hemorrhoids, severe mucosal changes in the entire examined colon Biopsy showed severely active colitis with ulcerations differential includes infectious, drug effect inflammatory bowel disease CMV  immunostain shows only rare positive cells lamina propria HSV 1 and 2 immunostain negative  Patient started on prednisone for possible IBD with plan of repeat flex sig 6 to 8 weeks. Patient started on prednisone taper 40 mg daily for 2 weeks, 30 mg for 7 days from milligrams for 7 days 10 mg for 7 days, Imodium as needed.  Started on Bactrim every Monday Wednesday Friday.  Daughter [name] and wife [name] are both in the room and provides some of the history. Patient's states diarrhea started in end of May/June when he also started Ozempic, and then he began to have intermittent bright red blood and dark red blood in the stool starting in June, went to urgent care June 23 due to having 10 bowel movements daily, worsening blood, no mucus.  With so she abdominal cramping.  Then had negative GI and C. difficile panels, colonoscopy 7/17 showed active colitis, started prednisone 40 mg taper on the 23rd, states he was feeling better had more energy, with was eating better went from 10 to 4-5 bowel movements daily and having less blood until last night he had 3 episodes of large-volume bloody stools with associated nausea and syncope/presyncope. He has had some chills but no fever. Patient's last dose of CellCept was 2 days ago on Saturday, [facility] told him to stop this.  He is continuing on the Prograf. Patient states his sugars have been elevated secondary to prednisone use and steroids. Denies chest pain, shortness of breath, dizziness.  Presents to the ER with rectal bleeding.  Work up notable for CTA negative for acute bleeding but did show evidence of ongoing  widespread colitis most pronounced sigmoid colon, new volume simple density free fluid in pelvis likely reactive no evidence of bowel perforation or obstruction.  No acute inflammatory process. 7/23 iron 18, saturation ratio 14, CBC 12.2 at the time, hemoglobin in the ER was 7.6, decreased 4-5 points, with associated hypotension. No elevated BUN. INR  1.3 probably Pending blood culture, no growth at 12 hours.   Abnormal ED labs: Abnormal Labs Reviewed  COMPREHENSIVE METABOLIC PANEL - Abnormal; Notable for the following components:      Result Value   Potassium 2.7 (*)    CO2 18 (*)    Glucose, Bld 224 (*)    Calcium 7.8 (*)    Total Protein 5.1 (*)    Albumin 1.9 (*)    AST 13 (*)    All other components within normal limits  CBC - Abnormal; Notable for the following components:   WBC 22.4 (*)    RBC 3.04 (*)    Hemoglobin 7.6 (*)    HCT 25.3 (*)    MCH 25.0 (*)    Platelets 437 (*)    All other components within normal limits  PROTIME-INR - Abnormal; Notable for the following components:   Prothrombin Time 15.9 (*)    INR 1.3 (*)    All other components within normal limits  I-STAT CG4 LACTIC ACID, ED - Abnormal; Notable for the following components:   Lactic Acid, Venous 2.5 (*)    All other components within normal limits    Past Medical History:  Diagnosis Date   Allergy    Asthma    Bicuspid aortic valve    sees dr Tenny Craw   Cirrhosis [facility])    Coronary artery disease    DM2 (diabetes mellitus, type 2) (HCC)    Fatty liver    with h/o elevated LFT's   GERD (gastroesophageal reflux disease)    Heart murmur    Hypertension    Itching    all over last few months   Jaundice    Liver transplant recipient (HCC)    09/16/2018 at [facility]   Migraine with aura    OSA (obstructive sleep apnea) 09/14/2011   PSG 11/08/11>>AHI 31.6, SpO2 low 85%. wears CPAP, pt does not know settings   Pulmonary embolism (HCC)    2022   Thyroid disease     Surgical History:  He  has a past surgical history that includes Cardiovascular stress test (04/2005); doppler echocardiography (07/2002); Biopsy thyroid (08/19/2007); Appendectomy (11/2007); Esophagogastroduodenoscopy (egd) with propofol (N/A, 05/04/2016); esophageal banding (N/A, 05/04/2016); Cardiac catheterization (03/21/2018); Liver transplant; RIGHT/LEFT HEART CATH AND CORONARY  ANGIOGRAPHY (N/A, 12/20/2020); Cholecystectomy; Aortic valve replacement (N/A, 04/03/2021); Coronary artery bypass graft (N/A, 04/03/2021); and TEE without cardioversion (N/A, 04/03/2021). Family History:  His family history includes Asthma in his mother; COPD in his father; Cancer in his father; Colon cancer in his maternal grandmother; Heart disease in his maternal grandfather and sister; Liver disease in his sister; Prostate cancer in his maternal uncle. Social History:   reports that he has never smoked. He has been exposed to tobacco smoke. He has never used smokeless tobacco. He reports that he does not drink alcohol and does not use drugs.  Prior to Admission medications   Medication Sig Start Date End Date Taking? Authorizing Provider  acetaminophen (TYLENOL) 500 MG tablet Take 500 mg by mouth every 6 (six) hours as needed for moderate pain or headache.    [provider]  albuterol (VENTOLIN HFA) 108 (90 Base)  MCG/ACT inhaler Inhale 2 puffs into the lungs every 6 (six) hours as needed for wheezing or shortness of breath.    [provider]  alendronate (FOSAMAX) 70 MG tablet Take 1 tablet (70 mg total) by mouth every Sunday. Take with a full glass of water on an empty stomach. 05/25/21   Joaquim Nam, MD  aspirin EC 325 MG EC tablet Take 1 tablet (325 mg total) by mouth daily. 04/08/21   Gold, Wayne E, PA-C  azelastine (ASTELIN) 0.1 % nasal spray PLACE 2 SPRAYS INTO BOTH NOSTRILS TWICE DAILY AS NEEDED 04/26/20   Joaquim Nam, MD  calcium carbonate (TUMS - DOSED IN MG ELEMENTAL CALCIUM) 500 MG chewable tablet Chew 1 tablet by mouth daily.    [provider]  Cholecalciferol (VITAMIN D) 50 MCG (2000 UT) tablet Take 2,000 Units by mouth daily.    [provider]  empagliflozin (JARDIANCE) 10 MG TABS tablet Take 1 tablet (10 mg total) by mouth daily before breakfast. 03/17/21   Joaquim Nam, MD  fluticasone [facility]) 50 MCG/ACT nasal spray PLACE ONE OR TWO  SPRAYS INTO BOTH NOSTRILS DAILY AS NEEDED. 12/22/17   Joaquim Nam, MD  hydrocortisone cream 1 % Apply to affected area 2 times daily 08/16/22   Rexford Maus, DO  levothyroxine (SYNTHROID) 175 MCG tablet Take 1 tablet (175 mcg total) by mouth daily before breakfast. 08/13/22   Pricilla Riffle, MD  metFORMIN (GLUCOPHAGE-XR) 500 MG 24 hr tablet Take 2 tablets (1,000 mg total) by mouth every morning AND 2 tablets (1,000 mg total) at bedtime. 08/21/22   Joaquim Nam, MD  metoprolol tartrate (LOPRESSOR) 25 MG tablet Take 1 tablet (25 mg total) by mouth two (2) times a day. 08/21/22   Joaquim Nam, MD  metroNIDAZOLE (METROGEL) 1 % gel Apply 1 application topically daily as needed (rosacea).    [provider]  mycophenolate (CELLCEPT) 250 MG capsule Take 250 mg by mouth 2 (two) times daily. 06/10/20   [provider]  NON FORMULARY Pt uses a cpap nightly    [provider]  ondansetron (ZOFRAN) 4 MG tablet Take 1 tablet (4 mg total) by mouth every 6 (six) hours. 08/16/22   Rexford Maus, DO  OneTouch Delica Lancets 33G MISC USE TO CHECK SUGAR DAILY 11/01/18   Joaquim Nam, MD  [facility] ULTRA test strip USE TO CHECK SUGAR DAILY 04/26/19   Joaquim Nam, MD  pravastatin (PRAVACHOL) 40 MG tablet Take 40 mg by mouth every evening.    [provider]  sertraline (ZOLOFT) 50 MG tablet Take 1 tablet (50 mg total) by mouth daily. 08/21/22   Joaquim Nam, MD  tacrolimus (PROGRAF) 1 MG capsule Take 2 mg by mouth 2 (two) times daily. 08/01/20   [provider]    Current Facility-Administered Medications  Medication Dose Route Frequency Provider Last Rate Last Admin   0.9 %  sodium chloride infusion (Manually program via Guardrails IV Fluids)   Intravenous Once Gilda Crease, MD   Held at 09/21/22 0500   Current Outpatient Medications  Medication Sig Dispense Refill   acetaminophen (TYLENOL) 500 MG tablet Take 500 mg by mouth every 6  (six) hours as needed for moderate pain or headache.     albuterol (VENTOLIN HFA) 108 (90 Base) MCG/ACT inhaler Inhale 2 puffs into the lungs every 6 (six) hours as needed for wheezing or shortness of breath.     alendronate (FOSAMAX) 70  MG tablet Take 1 tablet (70 mg total) by mouth every Sunday. Take with a full glass of water on an empty stomach. 13 tablet 3   aspirin EC 325 MG EC tablet Take 1 tablet (325 mg total) by mouth daily.     azelastine (ASTELIN) 0.1 % nasal spray PLACE 2 SPRAYS INTO BOTH NOSTRILS TWICE DAILY AS NEEDED 30 mL 1   calcium carbonate (TUMS - DOSED IN MG ELEMENTAL CALCIUM) 500 MG chewable tablet Chew 1 tablet by mouth daily.     Cholecalciferol (VITAMIN D) 50 MCG (2000 UT) tablet Take 2,000 Units by mouth daily.     empagliflozin (JARDIANCE) 10 MG TABS tablet Take 1 tablet (10 mg total) by mouth daily before breakfast. 30 tablet 5   fluticasone (FLONASE) 50 MCG/ACT nasal spray PLACE ONE OR TWO SPRAYS INTO BOTH NOSTRILS DAILY AS NEEDED. 48 g 3   hydrocortisone cream 1 % Apply to affected area 2 times daily 15 g 0   levothyroxine (SYNTHROID) 175 MCG tablet Take 1 tablet (175 mcg total) by mouth daily before breakfast. 90 tablet 1   metFORMIN (GLUCOPHAGE-XR) 500 MG 24 hr tablet Take 2 tablets (1,000 mg total) by mouth every morning AND 2 tablets (1,000 mg total) at bedtime. 360 tablet 3   metoprolol tartrate (LOPRESSOR) 25 MG tablet Take 1 tablet (25 mg total) by mouth two (2) times a day. 180 tablet 1   metroNIDAZOLE (METROGEL) 1 % gel Apply 1 application topically daily as needed (rosacea).     mycophenolate (CELLCEPT) 250 MG capsule Take 250 mg by mouth 2 (two) times daily.     NON FORMULARY Pt uses a cpap nightly     ondansetron (ZOFRAN) 4 MG tablet Take 1 tablet (4 mg total) by mouth every 6 (six) hours. 12 tablet 0   OneTouch Delica Lancets 33G MISC USE TO CHECK SUGAR DAILY 100 each 3   ONETOUCH ULTRA test strip USE TO CHECK SUGAR DAILY 100 strip 0   pravastatin  (PRAVACHOL) 40 MG tablet Take 40 mg by mouth every evening.     sertraline (ZOLOFT) 50 MG tablet Take 1 tablet (50 mg total) by mouth daily. 90 tablet 3   tacrolimus (PROGRAF) 1 MG capsule Take 2 mg by mouth 2 (two) times daily.      Allergies as of 09/21/2022 - Review Complete 09/21/2022  Allergen Reaction Noted   Watermelon flavor Itching 04/29/2016    Review of Systems:    Constitutional: No weight loss, fever, chills, weakness or fatigue HEENT: Eyes: No change in vision               Ears, Nose, Throat:  No change in hearing or congestion Skin: No rash or itching Cardiovascular: No chest pain, chest pressure or palpitations   Respiratory: No SOB or cough Gastrointestinal: See HPI and otherwise negative Genitourinary: No dysuria or change in urinary frequency Neurological: No headache, dizziness or syncope Musculoskeletal: No new muscle or joint pain Hematologic: No bleeding or bruising Psychiatric: No history of depression or anxiety     Physical Exam:  Vital signs in last 24 hours: Temp:  [98 F (36.7 C)-99 F (37.2 C)] 98.4 F (36.9 C) (07/29 1035) Pulse Rate:  [59-77] 62 (07/29 1445) Resp:  [9-22] 18 (07/29 1445) BP: (83-141)/(51-74) 110/69 (07/29 1445) SpO2:  [94 %-100 %] 100 % (07/29 1445) Weight:  [69.9 kg] 69.9 kg (07/29 0322)   Last BM recorded by nurses in past 5 days No data recorded  General:  Pleasant, well developed male in no acute distress Head:  Normocephalic and atraumatic. Eyes: sclerae anicteric,conjunctive pale  Heart:  regular rate and rhythm, no murmurs or gallops Pulm: Clear anteriorly; no wheezing Abdomen:  Soft, Non-distended AB, Active bowel sounds. mild tenderness in the lower abdomen. Without guarding and Without rebound, No organomegaly appreciated. Extremities:  Without edema. Msk:  Symmetrical without gross deformities. Peripheral pulses intact.  Neurologic:  Alert and  oriented x4;  No focal deficits.  Skin:   Dry and intact without  significant lesions or rashes. Psychiatric:  Cooperative. Normal mood and affect.  LAB RESULTS: Recent Labs    09/21/22 0326  WBC 22.4*  HGB 7.6*  HCT 25.3*  PLT 437*   BMET Recent Labs    09/21/22 0326  NA 135  K 2.7*  CL 106  CO2 18*  GLUCOSE 224*  BUN 19  CREATININE 1.10  CALCIUM 7.8*   LFT Recent Labs    09/21/22 0326  PROT 5.1*  ALBUMIN 1.9*  AST 13*  ALT 15  ALKPHOS 61  BILITOT 0.3   PT/INR Recent Labs    09/21/22 0326  LABPROT 15.9*  INR 1.3*    STUDIES: CT ANGIO GI BLEED  Result Date: 09/21/2022 CLINICAL DATA:  62 year old male with several days of bloody bowel movements. Bright red blood per rectum today. History of colitis. EXAM: CTA ABDOMEN AND PELVIS WITHOUT AND WITH CONTRAST TECHNIQUE: Multidetector CT imaging of the abdomen and pelvis was performed using the standard protocol during bolus administration of intravenous contrast. Multiplanar reconstructed images and MIPs were obtained and reviewed to evaluate the vascular anatomy. RADIATION DOSE REDUCTION: This exam was performed according to the departmental dose-optimization program which includes automated exposure control, adjustment of the mA and/or kV according to patient size and/or use of iterative reconstruction technique. CONTRAST:  OMNIPAQUE IOHEXOL 350 MG/ML SOLN COMPARISON:  CT Abdomen and Pelvis 08/16/2022 and earlier. FINDINGS: VASCULAR Normal caliber abdominal aorta. Minimal Aortoiliac calcified atherosclerosis. Negative for abdominal aortic aneurysm or dissection. Major arterial branches including the celiac, SMA and IMA are patent with no significant branch atherosclerosis identified. Iliac and proximal femoral arteries are patent with minimal to mild for age atherosclerosis. And the portal venous system is patent on the final phase series 8. Right lower quadrant staple line associated with chronic neo terminal ileum. Hyperenhancement of thick-walled sigmoid colon on the early  arterial phase images (series 6, image 120). But there is no contrast extravasation into the bowel lumen identified. Review of the MIP images confirms the above findings. NON-VASCULAR Lower chest: Prior sternotomy. Partially visible TAVR. Heart size is stable, within normal limits. No pericardial or pleural effusion. Negative lung bases. Hepatobiliary: Chronic cholecystectomy. Liver appears stable, negative. Pancreas: Negative. Spleen: Negative. Adrenals/Urinary Tract: Stable and negative. Stomach/Bowel: Rectum appears within normal limits today. From the splenic flexure through the sigmoid colon the large bowel is decompressed, but also appears somewhat thick-walled and featureless (left lower quadrant series 8, image 64) similar to the CT last month. Up to mild associated mesenteric stranding in those segments, most apparent on series 5 precontrast images. No diverticula identified. The distal sigmoid colon is gas distended and appears more normal. The transverse colon appears more normal, but the hepatic flexure has a similar featureless and thick-walled appearance (series 8, image 32). Mild if any mesenteric stranding there. There are occasional right colon diverticula. And there is a neo terminal ileum redemonstrated with no adverse features. No dilated small bowel. Decompressed stomach and duodenum. No pneumoperitoneum.  Lymphatic: No lymphadenopathy. Reproductive: Negative. Other: There is a small volume of free fluid now in the deep pelvis greater on the left (series 8, image 72) with simple fluid density. This is new from last month. Musculoskeletal: Median sternotomy. Flowing lower thoracic endplate osteophytes resulting and chronic lower thoracic spinal ankylosis. No acute osseous abnormality identified. IMPRESSION: 1. Evidence of ongoing widespread Colitis since the CT last month, most pronounced in the sigmoid colon. New small volume of simple density free fluid in the pelvis, likely reactive. No  evidence of bowel perforation or obstruction. Negative for active GI bleeding by CTA. 2. No other acute or inflammatory process identified. Electronically Signed   By: Odessa Fleming M.D.   On: 09/21/2022 05:47      Impression    Rectal bleeding with colitis seen on CT in setting of immunosuppression specifically CellCept for previous liver transplant 2020 Unremarkable colonoscopy 2014 Colitis seen on CT abdomen pelvis 6/23 and again on recent CTA Negative C. difficile and GI pathogen panel in June as well as most recently as 09/14/2022 09/09/2022 colonoscopy showed severe colitis throughout colon biopsy showed severely active colitis with ulcerations differential includes infectious/drug effect inflammatory bowel disease.  CMV immunostain shows rare positive for lamina propria HSV PCR negative, EBV PCR negative, CMV PCR negative.  Patient started on prednisone taper and Bactrim Monday Wednesday Friday after colonoscopy results -Patient pending bed transfer to [facility] however no bed available at this time -Most likely this represents a colitis from CellCept with previous normal colonoscopies. -This was send recall flex sig 6 weeks after being on prednisone  Leukocytosis In setting of immunosuppression/prednisone use Patient afebrile, did have some hypotension on arrival responded well to IVF. Could be from prednisone use but in setting of immunosuppression pending blood cultures, no growth for 12 hours -On Unasyn  Iron deficiency anemia Recently had IDA 7/23, likely secondary to colitis -Monitor CBC, transfuse if less than 7 -Consider iron infusion if this has not been done  Status post liver transplant 2020 for history of IgG4 autoimmune cholangiopathy and cirrhosis with [facility] 03/30/2022 MRI abdomen pelvis with and without showed mild lobular contour of the liver hepatic steatosis ill-defined enhancement left hepatic lobe, 1.1 cm area hepatic segment 6 1.6 cm no washout or capsular enhancement.  No  biliary ductal dilation normal pancreas, splenomegaly 13.9 no bowel obstruction no acute processes no inflammation seen at that time.   09/04/2022 AFP tumor marker unremarkable INR 1.3  Type 2 diabetes Has been on prednisone, monitor sugars  Hypokalemia Replete potassium  Active Problems:   * No active hospital problems. *    LOS: 0 days     Plan   62 year old male with history of liver transplant 2020 on CellCept and Prograf developing cold mitis 07/2022 multiple negative C. difficile and GI pathogen panels, 7/17 colonoscopy with active focal colitis, some CMV on cells, not treated presents with 3 episodes of profuse hematochezia, Hgb dropped from 12 on 7/23 down to 7.6 with associated hypotension, lactic acidosis, leukocytosis and hypokalemia.  Colitis likely secondary to CellCept which was stopped 7/27, continue to hold Has been on prednisone since 7/23 with some improvement of symptoms but now with hematochezia with HD instablity 7/17 colonoscopy with active focal colitis and some CMV seen Will plan on repeat flex sigmoidoscopy with EGD to evaluate response to prednisone as well as reevaluate for CMV, evaluate for possible upper GI source with history of cirrhosis/prednisone use.  Scheudled with Dr. Leonides Schanz 07/30 enema 1-2 hours prior.  We have discussed the risks of bleeding, infection, perforation, medication reactions, and remote risk of death associated with Flex sigmoidoscopy and EGD. All questions were answered and the patient acknowledges these risk and wishes to proceed.  -Will start patient on IV steroids for colitis -Check sed rate, CRP -Continue to monitor CBC, transfuse greater than 7, recheck every 6 hours -Replete potassium -Continue Unasyn -Monitor INR  Thank you for your kind consultation, we will continue to follow.   Doree Albee  09/21/2022, 3:17 PM

## 2022-09-21 NOTE — ED Provider Notes (Signed)
[facility] DE[facility]MENT AT [facility] Provider Note   CSN: [id number] Arrival date & time: 09/21/22  4098     History  Chief Complaint  Patient presents with   Rectal Bleeding    [name] is a 62 y.o. male.  Patient presents to the emergency department for evaluation of rectal bleeding.  Patient reports that he has been having ongoing issues with bleeding for the last couple of weeks.  He is followed at [facility] after liver transplant.  Patient had colonoscopy that showed focal active colitis.  He has been on antibiotics and prednisone for this.  Today he has been feeling weak, dizzy with ambulation.  He has passed out 3 times.  Ambulance was called tonight and he was found to have a blood pressure of 60/30.  Brought to the ER after receiving some IV fluids with improvement of blood pressure.  Patient does report some mild abdominal discomfort.  No fever.       Home Medications Prior to Admission medications   Medication Sig Start Date End Date Taking? Authorizing Provider  acetaminophen (TYLENOL) 500 MG tablet Take 500 mg by mouth every 6 (six) hours as needed for moderate pain or headache.    [provider]  albuterol (VENTOLIN HFA) 108 (90 Base) MCG/ACT inhaler Inhale 2 puffs into the lungs every 6 (six) hours as needed for wheezing or shortness of breath.    [provider]  alendronate (FOSAMAX) 70 MG tablet Take 1 tablet (70 mg total) by mouth every Sunday. Take with a full glass of water on an empty stomach. 05/25/21   Joaquim Nam, MD  aspirin EC 325 MG EC tablet Take 1 tablet (325 mg total) by mouth daily. 04/08/21   Gold, Wayne E, PA-C  azelastine (ASTELIN) 0.1 % nasal spray PLACE 2 SPRAYS INTO BOTH NOSTRILS TWICE DAILY AS NEEDED 04/26/20   Joaquim Nam, MD  calcium carbonate (TUMS - DOSED IN MG ELEMENTAL CALCIUM) 500 MG chewable tablet Chew 1 tablet by mouth daily.    [provider]  Cholecalciferol (VITAMIN D) 50 MCG  (2000 UT) tablet Take 2,000 Units by mouth daily.    [provider]  empagliflozin (JARDIANCE) 10 MG TABS tablet Take 1 tablet (10 mg total) by mouth daily before breakfast. 03/17/21   Joaquim Nam, MD  fluticasone [facility]) 50 MCG/ACT nasal spray PLACE ONE OR TWO SPRAYS INTO BOTH NOSTRILS DAILY AS NEEDED. 12/22/17   Joaquim Nam, MD  hydrocortisone cream 1 % Apply to affected area 2 times daily 08/16/22   Rexford Maus, DO  levothyroxine (SYNTHROID) 175 MCG tablet Take 1 tablet (175 mcg total) by mouth daily before breakfast. 08/13/22   Pricilla Riffle, MD  metFORMIN (GLUCOPHAGE-XR) 500 MG 24 hr tablet Take 2 tablets (1,000 mg total) by mouth every morning AND 2 tablets (1,000 mg total) at bedtime. 08/21/22   Joaquim Nam, MD  metoprolol tartrate (LOPRESSOR) 25 MG tablet Take 1 tablet (25 mg total) by mouth two (2) times a day. 08/21/22   Joaquim Nam, MD  metroNIDAZOLE (METROGEL) 1 % gel Apply 1 application topically daily as needed (rosacea).    [provider]  mycophenolate (CELLCEPT) 250 MG capsule Take 250 mg by mouth 2 (two) times daily. 06/10/20   [provider]  NON FORMULARY Pt uses a cpap nightly    [provider]  ondansetron (ZOFRAN) 4 MG tablet Take 1 tablet (4 mg total) by mouth  every 6 (six) hours. 08/16/22   Rexford Maus, DO  OneTouch Delica Lancets 33G MISC USE TO CHECK SUGAR DAILY 11/01/18   Joaquim Nam, MD  [facility] ULTRA test strip USE TO CHECK SUGAR DAILY 04/26/19   Joaquim Nam, MD  pravastatin (PRAVACHOL) 40 MG tablet Take 40 mg by mouth every evening.    [provider]  sertraline (ZOLOFT) 50 MG tablet Take 1 tablet (50 mg total) by mouth daily. 08/21/22   Joaquim Nam, MD  tacrolimus (PROGRAF) 1 MG capsule Take 2 mg by mouth 2 (two) times daily. 08/01/20   [provider]      Allergies    Watermelon flavor    Review of Systems   Review of Systems  Physical Exam Updated Vital  Signs BP 93/60   Pulse 69   Temp 98.2 F (36.8 C) (Oral)   Resp (!) 9   Ht 5\' 8"  (1.727 m)   Wt 69.9 kg   SpO2 100%   BMI 23.42 kg/m  Physical Exam Vitals and nursing note reviewed.  Constitutional:      General: He is not in acute distress.    Appearance: He is well-developed.  HENT:     Head: Normocephalic and atraumatic.     Mouth/Throat:     Mouth: Mucous membranes are moist.  Eyes:     General: Vision grossly intact. Gaze aligned appropriately.     Extraocular Movements: Extraocular movements intact.     Conjunctiva/sclera: Conjunctivae normal.  Cardiovascular:     Rate and Rhythm: Normal rate and regular rhythm.     Pulses: Normal pulses.     Heart sounds: Normal heart sounds, S1 normal and S2 normal. No murmur heard.    No friction rub. No gallop.  Pulmonary:     Effort: Pulmonary effort is normal. No respiratory distress.     Breath sounds: Normal breath sounds.  Abdominal:     Palpations: Abdomen is soft.     Tenderness: There is generalized abdominal tenderness. There is no guarding or rebound.     Hernia: No hernia is present.  Musculoskeletal:        General: No swelling.     Cervical back: Full passive range of motion without pain, normal range of motion and neck supple. No pain with movement, spinous process tenderness or muscular tenderness. Normal range of motion.     Right lower leg: No edema.     Left lower leg: No edema.  Skin:    General: Skin is warm and dry.     Capillary Refill: Capillary refill takes less than 2 seconds.     Findings: No ecchymosis, erythema, lesion or wound.  Neurological:     Mental Status: He is alert and oriented to person, place, and time.     GCS: GCS eye subscore is 4. GCS verbal subscore is 5. GCS motor subscore is 6.     Cranial Nerves: Cranial nerves 2-12 are intact.     Sensory: Sensation is intact.     Motor: Motor function is intact. No weakness or abnormal muscle tone.     Coordination: Coordination is intact.   Psychiatric:        Mood and Affect: Mood normal.        Speech: Speech normal.        Behavior: Behavior normal.     ED Results / Procedures / Treatments   Labs (all labs ordered are listed, but only abnormal results are  displayed) Labs Reviewed  COMPREHENSIVE METABOLIC PANEL - Abnormal; Notable for the following components:      Result Value   Potassium 2.7 (*)    CO2 18 (*)    Glucose, Bld 224 (*)    Calcium 7.8 (*)    Total Protein 5.1 (*)    Albumin 1.9 (*)    AST 13 (*)    All other components within normal limits  CBC - Abnormal; Notable for the following components:   WBC 22.4 (*)    RBC 3.04 (*)    Hemoglobin 7.6 (*)    HCT 25.3 (*)    MCH 25.0 (*)    Platelets 437 (*)    All other components within normal limits  PROTIME-INR - Abnormal; Notable for the following components:   Prothrombin Time 15.9 (*)    INR 1.3 (*)    All other components within normal limits  I-STAT CG4 LACTIC ACID, ED - Abnormal; Notable for the following components:   Lactic Acid, Venous 2.5 (*)    All other components within normal limits  CULTURE, BLOOD (ROUTINE X 2)  CULTURE, BLOOD (ROUTINE X 2)  POC OCCULT BLOOD, ED  I-STAT CG4 LACTIC ACID, ED  TYPE AND SCREEN  PREPARE RBC (CROSSMATCH)    EKG EKG Interpretation Date/Time:  Monday September 21 2022 03:20:38 EDT Ventricular Rate:  77 PR Interval:  127 QRS Duration:  101 QT Interval:  407 QTC Calculation: 461 R Axis:   0  Text Interpretation: Sinus rhythm RSR' in V1 or V2, right VCD or RVH Nonspecific repol abnormality, lateral leads Confirmed by Gilda Crease [phone]) on 09/21/2022 3:23:09 AM  Radiology CT ANGIO GI BLEED  Result Date: 09/21/2022 CLINICAL DATA:  62 year old male with several days of bloody bowel movements. Bright red blood per rectum today. History of colitis. EXAM: CTA ABDOMEN AND PELVIS WITHOUT AND WITH CONTRAST TECHNIQUE: Multidetector CT imaging of the abdomen and pelvis was performed using the  standard protocol during bolus administration of intravenous contrast. Multiplanar reconstructed images and MIPs were obtained and reviewed to evaluate the vascular anatomy. RADIATION DOSE REDUCTION: This exam was performed according to the departmental dose-optimization program which includes automated exposure control, adjustment of the mA and/or kV according to patient size and/or use of iterative reconstruction technique. CONTRAST:  OMNIPAQUE IOHEXOL 350 MG/ML SOLN COMPARISON:  CT Abdomen and Pelvis 08/16/2022 and earlier. FINDINGS: VASCULAR Normal caliber abdominal aorta. Minimal Aortoiliac calcified atherosclerosis. Negative for abdominal aortic aneurysm or dissection. Major arterial branches including the celiac, SMA and IMA are patent with no significant branch atherosclerosis identified. Iliac and proximal femoral arteries are patent with minimal to mild for age atherosclerosis. And the portal venous system is patent on the final phase series 8. Right lower quadrant staple line associated with chronic neo terminal ileum. Hyperenhancement of thick-walled sigmoid colon on the early arterial phase images (series 6, image 120). But there is no contrast extravasation into the bowel lumen identified. Review of the MIP images confirms the above findings. NON-VASCULAR Lower chest: Prior sternotomy. Partially visible TAVR. Heart size is stable, within normal limits. No pericardial or pleural effusion. Negative lung bases. Hepatobiliary: Chronic cholecystectomy. Liver appears stable, negative. Pancreas: Negative. Spleen: Negative. Adrenals/Urinary Tract: Stable and negative. Stomach/Bowel: Rectum appears within normal limits today. From the splenic flexure through the sigmoid colon the large bowel is decompressed, but also appears somewhat thick-walled and featureless (left lower quadrant series 8, image 64) similar to the CT last month. Up to mild associated  mesenteric stranding in those segments, most  apparent on series 5 precontrast images. No diverticula identified. The distal sigmoid colon is gas distended and appears more normal. The transverse colon appears more normal, but the hepatic flexure has a similar featureless and thick-walled appearance (series 8, image 32). Mild if any mesenteric stranding there. There are occasional right colon diverticula. And there is a neo terminal ileum redemonstrated with no adverse features. No dilated small bowel. Decompressed stomach and duodenum. No pneumoperitoneum. Lymphatic: No lymphadenopathy. Reproductive: Negative. Other: There is a small volume of free fluid now in the deep pelvis greater on the left (series 8, image 72) with simple fluid density. This is new from last month. Musculoskeletal: Median sternotomy. Flowing lower thoracic endplate osteophytes resulting and chronic lower thoracic spinal ankylosis. No acute osseous abnormality identified. IMPRESSION: 1. Evidence of ongoing widespread Colitis since the CT last month, most pronounced in the sigmoid colon. New small volume of simple density free fluid in the pelvis, likely reactive. No evidence of bowel perforation or obstruction. Negative for active GI bleeding by CTA. 2. No other acute or inflammatory process identified. Electronically Signed   By: Odessa Fleming M.D.   On: 09/21/2022 05:47    Procedures .Critical Care  Performed by: Gilda Crease, MD Authorized by: Gilda Crease, MD   Critical care provider statement:    Critical care time (minutes):  30   Critical care was necessary to treat or prevent imminent or life-threatening deterioration of the following conditions:  Shock   Critical care was time spent personally by me on the following activities:  Development of treatment plan with patient or surrogate, discussions with consultants, evaluation of patient's response to treatment, examination of patient, ordering and review of laboratory studies, ordering and review of  radiographic studies, ordering and performing treatments and interventions, pulse oximetry, re-evaluation of patient's condition and review of old charts   I assumed direction of critical care for this patient from another provider in my specialty: no     Care discussed with: accepting provider at another facility       Medications Ordered in ED Medications  0.9 %  sodium chloride infusion (Manually program via Guardrails IV Fluids) (0 mLs Intravenous Hold 09/21/22 0500)  potassium chloride 10 mEq in 100 mL IVPB (10 mEq Intravenous New Bag/Given 09/21/22 0752)  lactated ringers bolus 1,000 mL (0 mLs Intravenous Stopped 09/21/22 0707)  iohexol (OMNIPAQUE) 350 MG/ML injection 100 mL (100 mLs Intravenous Contrast Given 09/21/22 0511)  Ampicillin-Sulbactam (UNASYN) 3 g in sodium chloride 0.9 % 100 mL IVPB (0 g Intravenous Stopped 09/21/22 0734)    ED Course/ Medical Decision Making/ A&P                             Medical Decision Making Amount and/or Complexity of Data Reviewed External Data Reviewed: labs, radiology and notes. Labs: ordered. Decision-making details documented in ED Course. Radiology: ordered and independent interpretation performed. Decision-making details documented in ED Course. ECG/medicine tests: ordered and independent interpretation performed. Decision-making details documented in ED Course.  Risk Prescription drug management.   Differential diagnosis considered includes, but not limited to: Hemorrhoid; brisk upper GI bleed; lower GI bleed including diverticular bleed and AVM; colitis  Patient with history of liver transplant in 2020 secondary to cryptogenic cirrhosis and hepatocellular carcinoma, chronically immunocompromised, presents to the emergency department for severe rectal bleeding.  Patient has been experiencing diarrhea for months and is being  worked up at [facility] for this.  He recently started having bleeding in his stools and underwent colonoscopy  1 week ago that showed "severely active colitis with ulceration." He has been prescribed steroids and Bactrim to treat this (started on 09/16/22).  He now reports increased volume of dark red blood per rectum with multiple syncopal episodes today.  Patient initially very hypotensive for EMS, improved somewhat with IV fluids.  He continues to be hypotensive here in the ED.  Initial hemoglobin 7.5, down from 11.2 last week.  Administered IV fluids and initiated on blood transfusion.  Patient with leukocytosis, likely multifactorial - steroids, acute bleed and active colitis. CT angio bleeding study performed, no active bleeding noted.  06:25AM -discussed with Adam at the patient transfer center at [facility].  He has discussed the patient with his team's and I am advised that they are at capacity.  He recommends I call back after 7 AM.  I did ask for the possibility of ED to ED transfer as this patient has had extensive care at [facility] and is in need of their continued care.  I was informed by Madelaine Bhat that "they did not entertain that option".  07:45AM - discussed with Dr. Ivonne Andrew, hospitalist at [facility].  His labs, vital signs, progress were discussed.  It was agreed that the patient would benefit from transfer to [facility], accepted for transfer pending bed availability.        Final Clinical Impression(s) / ED Diagnoses Final diagnoses:  Lower GI bleed  Hypovolemic shock (HCC)    Rx / DC Orders ED Discharge Orders     None         Barnes Florek, Canary Brim, MD 09/21/22 [id number]

## 2022-09-22 ENCOUNTER — Encounter (HOSPITAL_COMMUNITY)
Admission: EM | Disposition: A | Discharge status: Other Acute Inpatient Hospital | Payer: Self-pay | Source: Home / Self Care | Attending: Family Medicine

## 2022-09-22 ENCOUNTER — Inpatient Hospital Stay (HOSPITAL_COMMUNITY): Payer: Medicare Other | Admitting: Anesthesiology

## 2022-09-22 DIAGNOSIS — K2971 Gastritis, unspecified, with bleeding: Secondary | ICD-10-CM | POA: Diagnosis not present

## 2022-09-22 DIAGNOSIS — D649 Anemia, unspecified: Secondary | ICD-10-CM | POA: Diagnosis not present

## 2022-09-22 DIAGNOSIS — Z944 Liver transplant status: Secondary | ICD-10-CM | POA: Diagnosis not present

## 2022-09-22 DIAGNOSIS — Z79890 Hormone replacement therapy (postmenopausal): Secondary | ICD-10-CM | POA: Diagnosis not present

## 2022-09-22 DIAGNOSIS — E876 Hypokalemia: Secondary | ICD-10-CM | POA: Diagnosis not present

## 2022-09-22 DIAGNOSIS — T380X5A Adverse effect of glucocorticoids and synthetic analogues, initial encounter: Secondary | ICD-10-CM | POA: Diagnosis not present

## 2022-09-22 DIAGNOSIS — D509 Iron deficiency anemia, unspecified: Secondary | ICD-10-CM | POA: Diagnosis not present

## 2022-09-22 DIAGNOSIS — G4733 Obstructive sleep apnea (adult) (pediatric): Secondary | ICD-10-CM | POA: Diagnosis not present

## 2022-09-22 DIAGNOSIS — K529 Noninfective gastroenteritis and colitis, unspecified: Secondary | ICD-10-CM | POA: Diagnosis not present

## 2022-09-22 DIAGNOSIS — K649 Unspecified hemorrhoids: Secondary | ICD-10-CM | POA: Diagnosis not present

## 2022-09-22 DIAGNOSIS — E785 Hyperlipidemia, unspecified: Secondary | ICD-10-CM | POA: Diagnosis not present

## 2022-09-22 DIAGNOSIS — K921 Melena: Secondary | ICD-10-CM

## 2022-09-22 DIAGNOSIS — T451X5A Adverse effect of antineoplastic and immunosuppressive drugs, initial encounter: Secondary | ICD-10-CM | POA: Diagnosis not present

## 2022-09-22 DIAGNOSIS — K2981 Duodenitis with bleeding: Secondary | ICD-10-CM | POA: Diagnosis not present

## 2022-09-22 DIAGNOSIS — K2991 Gastroduodenitis, unspecified, with bleeding: Secondary | ICD-10-CM | POA: Diagnosis not present

## 2022-09-22 DIAGNOSIS — K922 Gastrointestinal hemorrhage, unspecified: Secondary | ICD-10-CM

## 2022-09-22 DIAGNOSIS — R571 Hypovolemic shock: Secondary | ICD-10-CM | POA: Diagnosis present

## 2022-09-22 DIAGNOSIS — K2970 Gastritis, unspecified, without bleeding: Secondary | ICD-10-CM

## 2022-09-22 DIAGNOSIS — I10 Essential (primary) hypertension: Secondary | ICD-10-CM

## 2022-09-22 DIAGNOSIS — Z951 Presence of aortocoronary bypass graft: Secondary | ICD-10-CM | POA: Diagnosis not present

## 2022-09-22 DIAGNOSIS — J45909 Unspecified asthma, uncomplicated: Secondary | ICD-10-CM | POA: Diagnosis not present

## 2022-09-22 DIAGNOSIS — E119 Type 2 diabetes mellitus without complications: Secondary | ICD-10-CM

## 2022-09-22 DIAGNOSIS — Z86711 Personal history of pulmonary embolism: Secondary | ICD-10-CM | POA: Diagnosis not present

## 2022-09-22 DIAGNOSIS — Z79899 Other long term (current) drug therapy: Secondary | ICD-10-CM | POA: Diagnosis not present

## 2022-09-22 DIAGNOSIS — Y92009 Unspecified place in unspecified non-institutional (private) residence as the place of occurrence of the external cause: Secondary | ICD-10-CM | POA: Diagnosis not present

## 2022-09-22 DIAGNOSIS — I2510 Atherosclerotic heart disease of native coronary artery without angina pectoris: Secondary | ICD-10-CM | POA: Diagnosis not present

## 2022-09-22 DIAGNOSIS — K2980 Duodenitis without bleeding: Secondary | ICD-10-CM

## 2022-09-22 DIAGNOSIS — E039 Hypothyroidism, unspecified: Secondary | ICD-10-CM | POA: Diagnosis not present

## 2022-09-22 DIAGNOSIS — K760 Fatty (change of) liver, not elsewhere classified: Secondary | ICD-10-CM | POA: Diagnosis not present

## 2022-09-22 DIAGNOSIS — K521 Toxic gastroenteritis and colitis: Secondary | ICD-10-CM | POA: Diagnosis not present

## 2022-09-22 DIAGNOSIS — Z952 Presence of prosthetic heart valve: Secondary | ICD-10-CM

## 2022-09-22 DIAGNOSIS — W1811XA Fall from or off toilet without subsequent striking against object, initial encounter: Secondary | ICD-10-CM | POA: Diagnosis not present

## 2022-09-22 DIAGNOSIS — E872 Acidosis, unspecified: Secondary | ICD-10-CM | POA: Diagnosis not present

## 2022-09-22 DIAGNOSIS — D62 Acute posthemorrhagic anemia: Secondary | ICD-10-CM | POA: Diagnosis not present

## 2022-09-22 HISTORY — PX: ESOPHAGOGASTRODUODENOSCOPY (EGD) WITH PROPOFOL: SHX5813

## 2022-09-22 HISTORY — PX: FLEXIBLE SIGMOIDOSCOPY: SHX5431

## 2022-09-22 HISTORY — PX: BIOPSY: SHX5522

## 2022-09-22 LAB — CBC
HCT: 26.1 % — ABNORMAL LOW (ref 39.0–52.0)
Hemoglobin: 8 g/dL — ABNORMAL LOW (ref 13.0–17.0)
MCH: 25.6 pg — ABNORMAL LOW (ref 26.0–34.0)
MCHC: 30.7 g/dL (ref 30.0–36.0)
MCV: 83.7 fL (ref 80.0–100.0)
Platelets: 319 10*3/uL (ref 150–400)
RBC: 3.12 MIL/uL — ABNORMAL LOW (ref 4.22–5.81)
RDW: 15.6 % — ABNORMAL HIGH (ref 11.5–15.5)
WBC: 5.2 10*3/uL (ref 4.0–10.5)
nRBC: 0 % (ref 0.0–0.2)

## 2022-09-22 LAB — BASIC METABOLIC PANEL
Anion gap: 9 (ref 5–15)
BUN: 13 mg/dL (ref 6–20)
CO2: 23 mmol/L (ref 22–32)
Calcium: 7.9 mg/dL — ABNORMAL LOW (ref 8.9–10.3)
Chloride: 104 mmol/L (ref 98–111)
Creatinine, Ser: 0.91 mg/dL (ref 0.61–1.24)
GFR, Estimated: >60 mL/min (ref >60)
Glucose, Bld: 155 mg/dL — ABNORMAL HIGH (ref 70–99)
Potassium: 3.4 mmol/L — ABNORMAL LOW (ref 3.5–5.1)
Sodium: 136 mmol/L (ref 135–145)

## 2022-09-22 LAB — GLUCOSE, CAPILLARY
Glucose-Capillary: 108 mg/dL — ABNORMAL HIGH (ref 70–99)
Glucose-Capillary: 127 mg/dL — ABNORMAL HIGH (ref 70–99)
Glucose-Capillary: 158 mg/dL — ABNORMAL HIGH (ref 70–99)
Glucose-Capillary: 209 mg/dL — ABNORMAL HIGH (ref 70–99)
Glucose-Capillary: 211 mg/dL — ABNORMAL HIGH (ref 70–99)
Glucose-Capillary: 99 mg/dL (ref 70–99)

## 2022-09-22 LAB — CBG MONITORING, ED: Glucose-Capillary: 143 mg/dL — ABNORMAL HIGH (ref 70–99)

## 2022-09-22 LAB — HIV ANTIBODY (ROUTINE TESTING W REFLEX): HIV Screen 4th Generation wRfx: NONREACTIVE

## 2022-09-22 SURGERY — ESOPHAGOGASTRODUODENOSCOPY (EGD) WITH PROPOFOL
Anesthesia: Monitor Anesthesia Care

## 2022-09-22 SURGERY — FLEXIBLE SIGMOIDOSCOPY
Anesthesia: Monitor Anesthesia Care

## 2022-09-22 MED ORDER — LACTATED RINGERS IV SOLN: INTRAVENOUS | Status: DC

## 2022-09-22 MED ORDER — PANTOPRAZOLE SODIUM 40 MG PO TBEC
40.0000 mg | DELAYED_RELEASE_TABLET | Freq: Every day | ORAL | Status: DC
  Administered 2022-09-22 – 2022-09-25 (×4): 40 mg via ORAL
  Filled 2022-09-22 (×5): qty 1

## 2022-09-22 MED ORDER — POTASSIUM CHLORIDE CRYS ER 20 MEQ PO TBCR
40.0000 meq | EXTENDED_RELEASE_TABLET | Freq: Once | ORAL | Status: AC
  Administered 2022-09-22: 40 meq via ORAL
  Filled 2022-09-22: qty 2

## 2022-09-22 MED ORDER — PROPOFOL 500 MG/50ML IV EMUL
INTRAVENOUS | Status: DC | PRN
  Administered 2022-09-22: 120 ug/kg/min via INTRAVENOUS

## 2022-09-22 MED ORDER — SODIUM CHLORIDE 0.9 % IV SOLN: INTRAVENOUS | Status: DC

## 2022-09-22 MED ORDER — SERTRALINE HCL 50 MG PO TABS
50.0000 mg | ORAL_TABLET | Freq: Every day | ORAL | Status: DC
  Administered 2022-09-22 – 2022-09-26 (×5): 50 mg via ORAL
  Filled 2022-09-22 (×5): qty 1

## 2022-09-22 MED ORDER — PROPOFOL 10 MG/ML IV BOLUS
INTRAVENOUS | Status: DC | PRN
Start: 2022-09-22 — End: 2022-09-22
  Administered 2022-09-22: 60 mg via INTRAVENOUS

## 2022-09-22 MED ORDER — LIDOCAINE 2% (20 MG/ML) 5 ML SYRINGE
INTRAMUSCULAR | Status: DC | PRN
  Administered 2022-09-22: 80 mg via INTRAVENOUS

## 2022-09-22 MED ORDER — GLYCOPYRROLATE PF 0.2 MG/ML IJ SOSY
PREFILLED_SYRINGE | INTRAMUSCULAR | Status: DC | PRN
  Administered 2022-09-22: .1 mg via INTRAVENOUS

## 2022-09-22 MED ORDER — LEVOTHYROXINE SODIUM 175 MCG PO TABS
175.0000 ug | ORAL_TABLET | Freq: Every day | ORAL | Status: DC
  Administered 2022-09-23 – 2022-09-26 (×4): 175 ug via ORAL
  Filled 2022-09-22 (×5): qty 1

## 2022-09-22 MED ORDER — MAGNESIUM SULFATE 2 GM/50ML IV SOLN
2.0000 g | Freq: Once | INTRAVENOUS | Status: AC
  Administered 2022-09-22: 2 g via INTRAVENOUS
  Filled 2022-09-22: qty 50

## 2022-09-22 MED ORDER — PHENYLEPHRINE HCL (PRESSORS) 10 MG/ML IV SOLN
INTRAVENOUS | Status: DC | PRN
Start: 2022-09-22 — End: 2022-09-22
  Administered 2022-09-22: 80 ug via INTRAVENOUS

## 2022-09-22 SURGICAL SUPPLY — 15 items

## 2022-09-22 NOTE — Progress Notes (Signed)
Patient seen and examined.  Overnight events noted.  He was admitted early morning hours by nighttime hospitalist.  He has extensive medical issues.  Refer to H&P done by nighttime hospitalist.  Case discussed with gastroenterologist.  Initially plan to transfer to [facility], however patient decided to stay here with [place] with GI recommendations.  He is planned for upper GI endoscopy and flexible sigmoidoscopy today.  Remains overall stable.  Workup in progress for diffuse colitis.  Currently remains on IV antibiotics and IV steroids.   Same-day admit.  No charge visit.

## 2022-09-22 NOTE — Transfer of Care (Signed)
Immediate Anesthesia Transfer of Care Note  Patient: [name]  Procedure(s) Performed: ESOPHAGOGASTRODUODENOSCOPY (EGD) WITH PROPOFOL FLEXIBLE SIGMOIDOSCOPY BIOPSY  Patient Location: PACU and Endoscopy Unit  Anesthesia Type:MAC  Level of Consciousness: awake  Airway & Oxygen Therapy: Patient Spontanous Breathing  Post-op Assessment: Report given to RN and Post -op Vital signs reviewed and stable  Post vital signs: Reviewed and stable  Last Vitals:  Vitals Value Taken Time  BP 115/76 09/22/22 1220  Temp 36.2 C 09/22/22 1220  Pulse 63 09/22/22 1220  Resp 16 09/22/22 1220  SpO2 98 % 09/22/22 1220    Last Pain:  Vitals:   09/22/22 1220  TempSrc: Temporal  PainSc: 0-No pain         Complications: No notable events documented.

## 2022-09-22 NOTE — ED Notes (Signed)
Patient ambulated to the bathroom. Patient was given some ice chips and is now resting in bed.

## 2022-09-22 NOTE — H&P (Signed)
History and Physical    Patient: [name] YQM:[id number] DOB: August 20, 1960 DOA: 09/21/2022 DOS: the patient was seen and examined on 09/22/2022 PCP: Joaquim Nam, MD  Patient coming from: Home  Chief Complaint:  Chief Complaint  Patient presents with   Rectal Bleeding   HPI: HODARI MULLALLY is a 62 y.o. 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   Pt is awaiting transfer to [facility] [place] and was seen earlier today in consultation by my colleague Dr. Alvester Morin pending GI consultation at the time. [facility] has evaluated patient and plans to do flexsig and endoscopy in the morning so [facility] hospitalist will now formally admit.   Patient began to have diarrhea after start Ozempic in May. Had CT abdomen in June demonstrating moderate severity descending and sigmoid colitis. He was treated with multiple course of antibiotics without improvement. Had several C.diff test that was negative. Negative stool pathogen test. Negative EBV, CMV, and HSV.  Subsequently underwent colonoscopy on 09/09/2022 with severe mucosal changes characterized by edema, erythema, friability, deep ulcerations and shallow ulcerations throughout the entire colon. Pathology from biopsy revealed CMV immunostain shows only rare positive cells in lamina propria. No conspicuous evidence of viropathic change is seen on H&E stain. HSV1/2 immunostain is negative. Patient was then started on prolonged steroid taper and prophylaxic Bactrim. He has some improvement of his diarrhea and energy level on steroids. However yesterday had bloody diarrhea so decided to present to ED. Has mostly pain to rectal region due to hemorroids. Also has lower abdominal tenderness. He is on Cellcept and  Prograf for his liver transplant but was told by [facility] to discontinue Cellcept 2 days ago due to concerns for Cellcept induced colitis.    In the ED, he was afebrile and initially hypotensive in the 80s-90s. Improved with fluids. No leukocytosis. Significant anemia of 7.6 down from 12.2 just 4 weeks ago. Mild hypokalemia of 3.1. Stable creatinine.   CT Angio Abd with widespread colitis but no acute GI extravasation.  He was started on IV Unasyn and was seen in consultation by [facility] who recommends starting IV steroid with plans for flex sig and endoscopy in the morning. He has also been transfused 2 units of pRBC.    Review of Systems: As mentioned in the history of present illness. All other systems reviewed and are negative. Past Medical History:  Diagnosis Date   Allergy    Asthma    Bicuspid aortic valve    sees dr Tenny Craw   Cirrhosis [facility])    Coronary artery disease    DM2 (diabetes mellitus, type 2) (HCC)    Fatty liver    with h/o elevated LFT's   GERD (gastroesophageal reflux disease)    Heart murmur    Hypertension    Itching    all over last few months   Jaundice    Liver transplant recipient (HCC)    09/16/2018 at [facility]   Migraine with aura    OSA (obstructive sleep apnea) 09/14/2011   PSG 11/08/11>>AHI 31.6, SpO2 low 85%. wears CPAP, pt does not know settings   Pulmonary embolism (HCC)    2022   Thyroid disease    Past Surgical History:  Procedure Laterality Date   AORTIC VALVE REPLACEMENT N/A 04/03/2021   Procedure:  AORTIC VALVE REPLACEMENT (AVR) USING 27 MM INSPIRIS RESILIA  AORTIC VALVE;  Surgeon: Alleen Borne, MD;  Location: [facility] OR;  Service: Open Heart Surgery;  Laterality: N/A;   APPENDECTOMY  11/2007   Emergency   BIOPSY THYROID  08/19/2007   Attempted, no tissue obtained   CARDIAC CATHETERIZATION  03/21/2018   CARDIOVASCULAR STRESS TEST  04/2005   Negative 06/05   CHOLECYSTECTOMY     CORONARY ARTERY BYPASS GRAFT N/A 04/03/2021   Procedure: CORONARY ARTERY  BYPASS GRAFTING (CABG) x ONE ON CARDIOPULMONARY BYPASS. LIMA TO LAD;  Surgeon: Alleen Borne, MD;  Location: [facility] OR;  Service: Open Heart Surgery;  Laterality: N/A;   DOPPLER ECHOCARDIOGRAPHY  07/2002   ESOPHAGEAL BANDING N/A 05/04/2016   Procedure: ESOPHAGEAL BANDING;  Surgeon: Iva Boop, MD;  Location: [facility] ENDOSCOPY;  Service: Endoscopy;  Laterality: N/A;   ESOPHAGOGASTRODUODENOSCOPY (EGD) WITH PROPOFOL N/A 05/04/2016   Procedure: ESOPHAGOGASTRODUODENOSCOPY (EGD) WITH PROPOFOL;  Surgeon: Iva Boop, MD;  Location: [facility] ENDOSCOPY;  Service: Endoscopy;  Laterality: N/A;   LIVER TRANSPLANT     09/16/2018 at [facility]   RIGHT/LEFT HEART CATH AND CORONARY ANGIOGRAPHY N/A 12/20/2020   Procedure: RIGHT/LEFT HEART CATH AND CORONARY ANGIOGRAPHY;  Surgeon: Kathleene Hazel, MD;  Location: [facility] INVASIVE CV LAB;  Service: Cardiovascular;  Laterality: N/A;   TEE WITHOUT CARDIOVERSION N/A 04/03/2021   Procedure: TRANSESOPHAGEAL ECHOCARDIOGRAM (TEE);  Surgeon: Alleen Borne, MD;  Location: [facility] OR;  Service: Open Heart Surgery;  Laterality: N/A;   Social History:  reports that he has never smoked. He has been exposed to tobacco smoke. He has never used smokeless tobacco. He reports that he does not drink alcohol and does not use drugs.  Allergies  Allergen Reactions   Watermelon Flavor Itching    Mouth itching    Family History  Problem Relation Age of Onset   Asthma Mother    Cancer Father        Died when pt was 3 of CA with mets, site unknown, COPD   COPD Father    Heart disease Sister        Heart stopped   Liver disease Sister        "gene" for liver disease   Colon cancer Maternal Grandmother    Heart disease Maternal Grandfather        MI, 22 YOA   Prostate cancer Maternal Uncle    Esophageal cancer Neg Hx    Rectal cancer Neg Hx    Stomach cancer Neg Hx     Prior to Admission medications   Medication Sig Start Date End Date Taking? Authorizing Provider  acetaminophen  (TYLENOL) 500 MG tablet Take 500 mg by mouth every 6 (six) hours as needed for moderate pain or headache.    [provider]  albuterol (VENTOLIN HFA) 108 (90 Base) MCG/ACT inhaler Inhale 2 puffs into the lungs every 6 (six) hours as needed for wheezing or shortness of breath.    [provider]  alendronate (FOSAMAX) 70 MG tablet Take 1 tablet (70 mg total) by mouth every Sunday. Take with a full glass of water on an empty stomach. 05/25/21   Joaquim Nam, MD  aspirin EC 325 MG EC tablet Take 1 tablet (325 mg total) by mouth daily. 04/08/21   Gold, Wayne E, PA-C  azelastine (ASTELIN) 0.1 % nasal spray PLACE 2 SPRAYS INTO BOTH NOSTRILS TWICE DAILY AS NEEDED 04/26/20   Joaquim Nam, MD  calcium carbonate (TUMS -  DOSED IN MG ELEMENTAL CALCIUM) 500 MG chewable tablet Chew 1 tablet by mouth daily.    [provider]  Cholecalciferol (VITAMIN D) 50 MCG (2000 UT) tablet Take 2,000 Units by mouth daily.    [provider]  empagliflozin (JARDIANCE) 10 MG TABS tablet Take 1 tablet (10 mg total) by mouth daily before breakfast. 03/17/21   Joaquim Nam, MD  fluticasone [facility]) 50 MCG/ACT nasal spray PLACE ONE OR TWO SPRAYS INTO BOTH NOSTRILS DAILY AS NEEDED. 12/22/17   Joaquim Nam, MD  hydrocortisone cream 1 % Apply to affected area 2 times daily 08/16/22   Rexford Maus, DO  levothyroxine (SYNTHROID) 175 MCG tablet Take 1 tablet (175 mcg total) by mouth daily before breakfast. 08/13/22   Pricilla Riffle, MD  metFORMIN (GLUCOPHAGE-XR) 500 MG 24 hr tablet Take 2 tablets (1,000 mg total) by mouth every morning AND 2 tablets (1,000 mg total) at bedtime. 08/21/22   Joaquim Nam, MD  metoprolol tartrate (LOPRESSOR) 25 MG tablet Take 1 tablet (25 mg total) by mouth two (2) times a day. 08/21/22   Joaquim Nam, MD  metroNIDAZOLE (METROGEL) 1 % gel Apply 1 application topically daily as needed (rosacea).    [provider]  mycophenolate (CELLCEPT) 250  MG capsule Take 250 mg by mouth 2 (two) times daily. 06/10/20   [provider]  NON FORMULARY Pt uses a cpap nightly    [provider]  ondansetron (ZOFRAN) 4 MG tablet Take 1 tablet (4 mg total) by mouth every 6 (six) hours. 08/16/22   Rexford Maus, DO  OneTouch Delica Lancets 33G MISC USE TO CHECK SUGAR DAILY 11/01/18   Joaquim Nam, MD  [facility] ULTRA test strip USE TO CHECK SUGAR DAILY 04/26/19   Joaquim Nam, MD  pravastatin (PRAVACHOL) 40 MG tablet Take 40 mg by mouth every evening.    [provider]  sertraline (ZOLOFT) 50 MG tablet Take 1 tablet (50 mg total) by mouth daily. 08/21/22   Joaquim Nam, MD  tacrolimus (PROGRAF) 1 MG capsule Take 2 mg by mouth 2 (two) times daily. 08/01/20   [provider]    Physical Exam: Vitals:   09/22/22 0000 09/22/22 0108 09/22/22 0117 09/22/22 0200  BP: 119/72 116/80  121/73  Pulse: (!) 56 (!) 50  (!) 59  Resp: 15 15  15   Temp:   97.7 F (36.5 C)   TempSrc:   Oral   SpO2: 100% 100%  100%  Weight:      Height:       Constitutional: NAD, calm, comfortable, nontoxic appearing elderly male appearing much younger than stated age laying in bed asleep.  Awoke easily to voice. Eyes: lids and conjunctivae normal ENMT: Mucous membranes are moist.  Neck: normal, supple Respiratory: clear to auscultation bilaterally, no wheezing, no crackles. Normal respiratory effort. No accessory muscle use.  Cardiovascular: Regular rate and rhythm, no murmurs / rubs / gallops. No extremity edema. Abdomen: Mild tenderness with palpation of lower quadrant.  Nondistended.  No rebound tenderness, guarding or rigidity.  Bowel sounds positive.  Musculoskeletal: no clubbing / cyanosis. No joint deformity upper and lower extremities. Normal muscle tone.  Skin: no rashes, lesions, ulcers. No induration Neurologic: CN 2-12 grossly intact.  Strength 5/5 in all 4.  Psychiatric: Normal judgment and insight. Alert and oriented x  3. Normal mood. Data Reviewed:  See HPI  Assessment and Plan: * Colitis -Noted recurrent colitis with failed outpatient  course of p.o. antibiotics and steroid in the setting of liver transplant -CT imaging compared to one in June now worsened with ongoing widespread Colitis  -Patient previously pending transfer to [facility] but due to lack of bed availability will now be admitted here with [facility] seeing in consultation -continue IV Unasyn -[facility] consulted and plans to take for Flexsig and endoscopy in the morning. Differential includes, IBD, infection (possible CMV) although he had negative CMV PCR and recent colonoscopy with CMV immunostain showed only rare positive cels in lamina propria. Also concerns of MMF-induced colitis. He has been off Cellcept for 2 days at the instruction of his care team at [facility].  -continue IV steroids    Acute blood loss anemia -Hemoglobin 7.6 today with baseline hemoglobin around 12-13 in the setting of colitis and bloody stools -2 u pRBC ordered in ED -follow post H/H with repeat transfusion as needed for Hgb <8 given significant cardiac hx    S/P AVR (aortic valve replacement) AVR with Inspiriris Resilia pericardial valve on 04/03/21  Looks to be on full dose ASA  Hold for now pending evaluation for recurrent colitis with acute blood loss anemia    Coronary artery disease involving native coronary artery of native heart without angina pectoris Pt s/p CABG x 1 with LIMA to LAD for 80% stenosis  On high dose ASA and statin  Holding statin for now in setting of colitis and bleeding  HLD (hyperlipidemia) Resume statin as colitis improves    Liver transplant recipient [facility]) S/p liver transplant 08/2018 for crytogenic cirrhosis  -LFTs within normal limits On cellcept and prograf - Cellcept was discontinued on 7/27 by [facility] due to concern for MMF-induced colitis Continue Prograf   Diabetes mellitus without complication (HCC) SSI    OSA  (obstructive sleep apnea) Cpap   Asthma Stable from a resp standpoint  Hold inhalers    Essential hypertension -initially hypotensive on arrival and improved with IV fluids -continues IV fluid -hold metoprolol for now    Hypothyroidism -continue Synthroid       Advance Care Planning:   Code Status: Full Code   Consults: [facility]  Family Communication: none at bedside   Severity of Illness: The appropriate patient status for this patient is INPATIENT. Inpatient status is judged to be reasonable and necessary in order to provide the required intensity of service to ensure the patient's safety. The patient's presenting symptoms, physical exam findings, and initial radiographic and laboratory data in the context of their chronic comorbidities is felt to place them at high risk for further clinical deterioration. Furthermore, it is not anticipated that the patient will be medically stable for discharge from the hospital within 2 midnights of admission.   * I certify that at the point of admission it is my clinical judgment that the patient will require inpatient hospital care spanning beyond 2 midnights from the point of admission due to high intensity of service, high risk for further deterioration and high frequency of surveillance required.*  Author: Anselm Jungling, DO 09/22/2022 2:55 AM  For on call review [email or web address].

## 2022-09-22 NOTE — Unmapped (Addendum)
Received notification from provider at [facility] that egd and flex sig completed on patient today with finding of severe UC flare. Communicated with Dr.Shah, who said patient was added to hospital transfer list.

## 2022-09-22 NOTE — Anesthesia Postprocedure Evaluation (Signed)
Anesthesia Post Note  Patient: [name]  Procedure(s) Performed: ESOPHAGOGASTRODUODENOSCOPY (EGD) WITH PROPOFOL FLEXIBLE SIGMOIDOSCOPY BIOPSY     Patient location during evaluation: PACU Anesthesia Type: MAC Level of consciousness: awake Pain management: pain level controlled Vital Signs Assessment: post-procedure vital signs reviewed and stable Respiratory status: spontaneous breathing, nonlabored ventilation and respiratory function stable Cardiovascular status: stable and blood pressure returned to baseline Postop Assessment: no apparent nausea or vomiting Anesthetic complications: no   No notable events documented.  Last Vitals:  Vitals:   09/22/22 1240 09/22/22 1250  BP: 127/83 (!) 157/92  Pulse: (!) 57 (!) 55  Resp: 20 (!) 21  Temp:    SpO2: 100% 97%    Last Pain:  Vitals:   09/22/22 1250  TempSrc:   PainSc: 0-No pain                 Linton Rump

## 2022-09-22 NOTE — ED Notes (Addendum)
ED TO INPATIENT HANDOFF REPORT  ED Nurse Name and Phone #: Lenis Dickinson [phone]  S Name/Age/Gender [name] 62 y.o. male Room/Bed: TRACC/TRACC  Code Status   Code Status: Full Code  Home/SNF/Other Home Patient oriented to: self, place, time, and situation Is this baseline? Yes   Triage Complete: Triage complete  Chief Complaint Acute blood loss anemia [D62]  Triage Note Pt came in from home. Hx of colitus.  Bloody bm for a few days. Today bright red blood only no feces for last few BM. Pt reports passing out and falling 3x today. Pt denies cp, N/V.  Bp 60/30 initial when ems arrived. pt has a hx of a liver transplant and valve replacement.  Last bp 80/54 after 500 ml of fluid.  Hr 70 Spo2 96        Allergies Allergies  Allergen Reactions   Watermelon Flavor Itching    Mouth itching    Level of Care/Admitting Diagnosis ED Disposition     ED Disposition  Admit   Condition  --   Comment  Hospital Area: [facility] [[id number]]  Level of Care: Telemetry Medical [104]  May admit patient to [facility] or [facility] if equivalent level of care is available:: No  Covid Evaluation: Asymptomatic - no recent exposure (last 10 days) testing not required  Diagnosis: Acute blood loss anemia [454098]  Admitting Physician: Anselm Jungling [[id number]]  Attending Physician: Anselm Jungling [[id number]]  Certification:: I certify this patient will need inpatient services for at least 2 midnights  Estimated Length of Stay: 3          B Medical/Surgery History Past Medical History:  Diagnosis Date   Allergy    Asthma    Bicuspid aortic valve    sees dr Tenny Craw   Cirrhosis [facility])    Coronary artery disease    DM2 (diabetes mellitus, type 2) (HCC)    Fatty liver    with h/o elevated LFT's   GERD (gastroesophageal reflux disease)    Heart murmur    Hypertension    Itching    all over last few months   Jaundice    Liver transplant recipient (HCC)     09/16/2018 at [facility]   Migraine with aura    OSA (obstructive sleep apnea) 09/14/2011   PSG 11/08/11>>AHI 31.6, SpO2 low 85%. wears CPAP, pt does not know settings   Pulmonary embolism (HCC)    2022   Thyroid disease    Past Surgical History:  Procedure Laterality Date   AORTIC VALVE REPLACEMENT N/A 04/03/2021   Procedure: AORTIC VALVE REPLACEMENT (AVR) USING 27 MM INSPIRIS RESILIA  AORTIC VALVE;  Surgeon: Alleen Borne, MD;  Location: [facility] OR;  Service: Open Heart Surgery;  Laterality: N/A;   APPENDECTOMY  11/2007   Emergency   BIOPSY THYROID  08/19/2007   Attempted, no tissue obtained   CARDIAC CATHETERIZATION  03/21/2018   CARDIOVASCULAR STRESS TEST  04/2005   Negative 06/05   CHOLECYSTECTOMY     CORONARY ARTERY BYPASS GRAFT N/A 04/03/2021   Procedure: CORONARY ARTERY BYPASS GRAFTING (CABG) x ONE ON CARDIOPULMONARY BYPASS. LIMA TO LAD;  Surgeon: Alleen Borne, MD;  Location[facility] OR;  Service: Open Heart Surgery;  Laterality: N/A;   DOPPLER ECHOCARDIOGRAPHY  07/2002   ESOPHAGEAL BANDING N/A 05/04/2016   Procedure: ESOPHAGEAL BANDING;  Surgeon: Iva Boop, MD;  Location: [facility] ENDOSCOPY;  Service: Endoscopy;  Laterality: N/A;   ESOPHAGOGASTRODUODENOSCOPY (EGD) WITH PROPOFOL N/A 05/04/2016  Procedure: ESOPHAGOGASTRODUODENOSCOPY (EGD) WITH PROPOFOL;  Surgeon: Iva Boop, MD;  Location: [facility] ENDOSCOPY;  Service: Endoscopy;  Laterality: N/A;   LIVER TRANSPLANT     09/16/2018 at [facility]   RIGHT/LEFT HEART CATH AND CORONARY ANGIOGRAPHY N/A 12/20/2020   Procedure: RIGHT/LEFT HEART CATH AND CORONARY ANGIOGRAPHY;  Surgeon: Kathleene Hazel, MD;  Location: [facility] INVASIVE CV LAB;  Service: Cardiovascular;  Laterality: N/A;   TEE WITHOUT CARDIOVERSION N/A 04/03/2021   Procedure: TRANSESOPHAGEAL ECHOCARDIOGRAM (TEE);  Surgeon: Alleen Borne, MD;  Location: [facility] OR;  Service: Open Heart Surgery;  Laterality: N/A;     A IV Location/Drains/Wounds Patient Lines/Drains/Airways Status     Active  Line/Drains/Airways     Name Placement date Placement time Site Days   Peripheral IV 09/21/22 20 G Right Antecubital 09/21/22  0327  Antecubital  1   Peripheral IV 09/21/22 20 G Posterior;Right Hand 09/21/22  0500  Hand  1            Intake/Output Last 24 hours  Intake/Output Summary (Last 24 hours) at 09/22/2022 4098 Last data filed at 09/21/2022 1153 [place] per 24 hour  Intake 1083.53 ml  Output 250 ml  Net 833.53 ml    Labs/Imaging Results for orders placed or performed during the hospital encounter of 09/21/22 (from the past 48 hour(s))  Comprehensive metabolic panel     Status: Abnormal   Collection Time: 09/21/22  3:26 AM  Result Value Ref Range   Sodium 135 135 - 145 mmol/L   Potassium 2.7 (LL) 3.5 - 5.1 mmol/L    Comment: CRITICAL RESULT CALLED TO, READ BACK BY AND VERIFIED WITH [place] MOOREFIELD RN 09/21/22 0400 M KOROLESKI   Chloride 106 98 - 111 mmol/L   CO2 18 (L) 22 - 32 mmol/L   Glucose, Bld 224 (H) 70 - 99 mg/dL    Comment: Glucose reference range applies only to samples taken after fasting for at least 8 hours.   BUN 19 6 - 20 mg/dL   Creatinine, Ser 1.19 0.61 - 1.24 mg/dL   Calcium 7.8 (L) 8.9 - 10.3 mg/dL   Total Protein 5.1 (L) 6.5 - 8.1 g/dL   Albumin 1.9 (L) 3.5 - 5.0 g/dL   AST 13 (L) 15 - 41 U/L   ALT 15 0 - 44 U/L   Alkaline Phosphatase 61 38 - 126 U/L   Total Bilirubin 0.3 0.3 - 1.2 mg/dL   GFR, Estimated >14 >78 mL/min    Comment: (NOTE) Calculated using the CKD-EPI Creatinine Equation (2021)    Anion gap 11 5 - 15    Comment: Performed at [facility], [address], [place], [place] [address]  CBC     Status: Abnormal   Collection Time: 09/21/22  3:26 AM  Result Value Ref Range   WBC 22.4 (H) 4.0 - 10.5 K/uL   RBC 3.04 (L) 4.22 - 5.81 MIL/uL   Hemoglobin 7.6 (L) 13.0 - 17.0 g/dL   HCT 13.0 (L) 86.5 - 78.4 %   MCV 83.2 80.0 - 100.0 fL   MCH 25.0 (L) 26.0 - 34.0 pg   MCHC 30.0 30.0 - 36.0 g/dL   RDW 69.6 29.5 - 28.4 %    Platelets 437 (H) 150 - 400 K/uL   nRBC 0.0 0.0 - 0.2 %    Comment: Performed at [facility], [address] [place], [place] [address]  Type and screen [facility]     Status: None (Preliminary result)  Collection Time: 09/21/22  3:26 AM  Result Value Ref Range   ABO/RH(D) AB POS    Antibody Screen NEG    Sample Expiration 09/24/2022,2359    Unit Number [id number]    Blood Component Type RED CELLS,LR    Unit division 00    Status of Unit ISSUED    Transfusion Status OK TO TRANSFUSE    Crossmatch Result      Compatible Performed at [facility], [address] [place], [place] [address]    Unit Number [id number]    Blood Component Type RED CELLS,LR    Unit division 00    Status of Unit ISSUED    Transfusion Status OK TO TRANSFUSE    Crossmatch Result Compatible   Protime-INR - (order if Patient is taking Coumadin / Warfarin)     Status: Abnormal   Collection Time: 09/21/22  3:26 AM  Result Value Ref Range   Prothrombin Time 15.9 (H) 11.4 - 15.2 seconds   INR 1.3 (H) 0.8 - 1.2    Comment: (NOTE) INR goal varies based on device and disease states. Performed at [facility], [address], [place], [place] [address]   Prepare RBC (crossmatch)     Status: None   Collection Time: 09/21/22  4:04 AM  Result Value Ref Range   Order Confirmation      ORDER PROCESSED BY BLOOD BANK Performed at [facility], [address] [place], [place] [address]   Culture, blood (Routine X 2) w Reflex to ID Panel     Status: None (Preliminary result)   Collection Time: 09/21/22  4:50 AM   Specimen: BLOOD  Result Value Ref Range   Specimen Description BLOOD RIGHT ANTECUBITAL    Special Requests      BOTTLES DRAWN AEROBIC AND ANAEROBIC Blood Culture adequate volume   Culture      NO GROWTH < 12 HOURS Performed at [facility], [address], [place], [place] [address]    Report Status PENDING   Culture, blood (Routine X 2) w  Reflex to ID Panel     Status: None (Preliminary result)   Collection Time: 09/21/22  4:55 AM   Specimen: BLOOD RIGHT HAND  Result Value Ref Range   Specimen Description BLOOD RIGHT HAND    Special Requests      BOTTLES DRAWN AEROBIC AND ANAEROBIC Blood Culture results may not be optimal due to an inadequate volume of blood received in culture bottles   Culture      NO GROWTH < 12 HOURS Performed at [facility], [address], [place], [place] [address]    Report Status PENDING   I-Stat CG4 Lactic Acid     Status: Abnormal   Collection Time: 09/21/22  5:02 AM  Result Value Ref Range   Lactic Acid, Venous 2.5 (HH) 0.5 - 1.9 mmol/L   Comment NOTIFIED PHYSICIAN   I-Stat CG4 Lactic Acid     Status: None   Collection Time: 09/21/22  9:10 AM  Result Value Ref Range   Lactic Acid, Venous 0.9 0.5 - 1.9 mmol/L  C-reactive protein     Status: Abnormal   Collection Time: 09/21/22  4:33 PM  Result Value Ref Range   CRP 8.0 (H) <1.0 mg/dL    Comment: Performed at [facility], [address] [place], [place] [address]  Magnesium     Status: Abnormal   Collection Time: 09/21/22  4:33 PM  Result Value Ref Range   Magnesium 1.5 (L) 1.7 - 2.4 mg/dL    Comment: Performed at [facility], [address] [place], [place] [address]  Sedimentation rate     Status: Abnormal   Collection Time: 09/21/22  4:33 PM  Result Value Ref Range   Sed Rate 38 (H) 0 - 16 mm/hr    Comment: Performed at [facility], [address] [place], [place] [address]  CBC     Status: Abnormal   Collection Time: 09/21/22  4:35 PM  Result Value Ref Range   WBC 5.9 4.0 - 10.5 K/uL   RBC 2.95 (L) 4.22 - 5.81 MIL/uL   Hemoglobin 7.6 (L) 13.0 - 17.0 g/dL   HCT 91.4 (L) 78.2 - 95.6 %   MCV 83.7 80.0 - 100.0 fL   MCH 25.8 (L) 26.0 - 34.0 pg   MCHC 30.8 30.0 - 36.0 g/dL   RDW 21.3 08.6 - 57.8 %   Platelets 308 150 - 400 K/uL   nRBC 0.0 0.0 - 0.2 %    Comment: Performed at [facility]  Lab, [address], [place], [place] [address]  Basic metabolic panel     Status: Abnormal   Collection Time: 09/21/22  4:35 PM  Result Value Ref Range   Sodium 136 135 - 145 mmol/L   Potassium 3.1 (L) 3.5 - 5.1 mmol/L   Chloride 103 98 - 111 mmol/L   CO2 19 (L) 22 - 32 mmol/L   Glucose, Bld 150 (H) 70 - 99 mg/dL    Comment: Glucose reference range applies only to samples taken after fasting for at least 8 hours.   BUN 15 6 - 20 mg/dL   Creatinine, Ser 9.52 0.61 - 1.24 mg/dL   Calcium 7.8 (L) 8.9 - 10.3 mg/dL   GFR, Estimated >84 >13 mL/min    Comment: (NOTE) Calculated using the CKD-EPI Creatinine Equation (2021)    Anion gap 14 5 - 15    Comment: Performed at [facility], [address], [place], [place] [address]  CBG monitoring, ED     Status: Abnormal   Collection Time: 09/21/22  4:35 PM  Result Value Ref Range   Glucose-Capillary 149 (H) 70 - 99 mg/dL    Comment: Glucose reference range applies only to samples taken after fasting for at least 8 hours.  CBG monitoring, ED     Status: Abnormal   Collection Time: 09/21/22  6:29 PM  Result Value Ref Range   Glucose-Capillary 124 (H) 70 - 99 mg/dL    Comment: Glucose reference range applies only to samples taken after fasting for at least 8 hours.  CBG monitoring, ED     Status: Abnormal   Collection Time: 09/21/22  8:03 PM  Result Value Ref Range   Glucose-Capillary 164 (H) 70 - 99 mg/dL    Comment: Glucose reference range applies only to samples taken after fasting for at least 8 hours.  CBG monitoring, ED     Status: Abnormal   Collection Time: 09/21/22  9:36 PM  Result Value Ref Range   Glucose-Capillary 167 (H) 70 - 99 mg/dL    Comment: Glucose reference range applies only to samples taken after fasting for at least 8 hours.  Hemoglobin and hematocrit, blood     Status: Abnormal   Collection Time: 09/21/22 10:17 PM  Result Value Ref Range   Hemoglobin 8.0 (L) 13.0 - 17.0 g/dL   HCT 02.7 (L)  39.0 - 52.0 %     Comment: Performed at [facility], [address] [place], [place] [address]  CBG monitoring, ED     Status: Abnormal   Collection Time: 09/21/22 11:35 PM  Result Value Ref Range   Glucose-Capillary 171 (H) 70 - 99 mg/dL    Comment: Glucose reference range applies only to samples taken after fasting for at least 8 hours.  CBG monitoring, ED     Status: Abnormal   Collection Time: 09/22/22  4:05 AM  Result Value Ref Range   Glucose-Capillary 143 (H) 70 - 99 mg/dL    Comment: Glucose reference range applies only to samples taken after fasting for at least 8 hours.  C-reactive protein     Status: Abnormal   Collection Time: 09/22/22  5:36 AM  Result Value Ref Range   CRP 8.8 (H) <1.0 mg/dL    Comment: Performed at [facility] Lab, [address], [place], [place] [address]  CBC     Status: Abnormal   Collection Time: 09/22/22  5:36 AM  Result Value Ref Range   WBC 5.2 4.0 - 10.5 K/uL   RBC 3.12 (L) 4.22 - 5.81 MIL/uL   Hemoglobin 8.0 (L) 13.0 - 17.0 g/dL   HCT 09.8 (L) 11.9 - 14.7 %   MCV 83.7 80.0 - 100.0 fL   MCH 25.6 (L) 26.0 - 34.0 pg   MCHC 30.7 30.0 - 36.0 g/dL   RDW 82.9 (H) 56.2 - 13.0 %   Platelets 319 150 - 400 K/uL   nRBC 0.0 0.0 - 0.2 %    Comment: Performed at [facility] Lab, [address] [place], [place] [address]  Basic metabolic panel     Status: Abnormal   Collection Time: 09/22/22  5:36 AM  Result Value Ref Range   Sodium 136 135 - 145 mmol/L   Potassium 3.4 (L) 3.5 - 5.1 mmol/L   Chloride 104 98 - 111 mmol/L   CO2 23 22 - 32 mmol/L   Glucose, Bld 155 (H) 70 - 99 mg/dL    Comment: Glucose reference range applies only to samples taken after fasting for at least 8 hours.   BUN 13 6 - 20 mg/dL   Creatinine, Ser 4.69 0.61 - 1.24 mg/dL   Calcium 7.9 (L) 8.9 - 10.3 mg/dL   GFR, Estimated >62 >95 mL/min    Comment: (NOTE) Calculated using the CKD-EPI Creatinine Equation (2021)    Anion gap 9 5 - 15    Comment: Performed at [facility]  Lab, [address], [place], [place] [address]   CT ANGIO GI BLEED  Result Date: 09/21/2022 CLINICAL DATA:  62 year old male with several days of bloody bowel movements. Bright red blood per rectum today. History of colitis. EXAM: CTA ABDOMEN AND PELVIS WITHOUT AND WITH CONTRAST TECHNIQUE: Multidetector CT imaging of the abdomen and pelvis was performed using the standard protocol during bolus administration of intravenous contrast. Multiplanar reconstructed images and MIPs were obtained and reviewed to evaluate the vascular anatomy. RADIATION DOSE REDUCTION: This exam was performed according to the departmental dose-optimization program which includes automated exposure control, adjustment of the mA and/or kV according to patient size and/or use of iterative reconstruction technique. CONTRAST:  OMNIPAQUE IOHEXOL 350 MG/ML SOLN COMPARISON:  CT Abdomen and Pelvis 08/16/2022 and earlier. FINDINGS: VASCULAR Normal caliber abdominal aorta. Minimal Aortoiliac calcified atherosclerosis. Negative for abdominal aortic aneurysm or dissection. Major arterial branches including the celiac, SMA and  IMA are patent with no significant branch atherosclerosis identified. Iliac and proximal femoral arteries are patent with minimal to mild for age atherosclerosis. And the portal venous system is patent on the final phase series 8. Right lower quadrant staple line associated with chronic neo terminal ileum. Hyperenhancement of thick-walled sigmoid colon on the early arterial phase images (series 6, image 120). But there is no contrast extravasation into the bowel lumen identified. Review of the MIP images confirms the above findings. NON-VASCULAR Lower chest: Prior sternotomy. Partially visible TAVR. Heart size is stable, within normal limits. No pericardial or pleural effusion. Negative lung bases. Hepatobiliary: Chronic cholecystectomy. Liver appears stable, negative. Pancreas: Negative. Spleen: Negative. Adrenals/Urinary  Tract: Stable and negative. Stomach/Bowel: Rectum appears within normal limits today. From the splenic flexure through the sigmoid colon the large bowel is decompressed, but also appears somewhat thick-walled and featureless (left lower quadrant series 8, image 64) similar to the CT last month. Up to mild associated mesenteric stranding in those segments, most apparent on series 5 precontrast images. No diverticula identified. The distal sigmoid colon is gas distended and appears more normal. The transverse colon appears more normal, but the hepatic flexure has a similar featureless and thick-walled appearance (series 8, image 32). Mild if any mesenteric stranding there. There are occasional right colon diverticula. And there is a neo terminal ileum redemonstrated with no adverse features. No dilated small bowel. Decompressed stomach and duodenum. No pneumoperitoneum. Lymphatic: No lymphadenopathy. Reproductive: Negative. Other: There is a small volume of free fluid now in the deep pelvis greater on the left (series 8, image 72) with simple fluid density. This is new from last month. Musculoskeletal: Median sternotomy. Flowing lower thoracic endplate osteophytes resulting and chronic lower thoracic spinal ankylosis. No acute osseous abnormality identified. IMPRESSION: 1. Evidence of ongoing widespread Colitis since the CT last month, most pronounced in the sigmoid colon. New small volume of simple density free fluid in the pelvis, likely reactive. No evidence of bowel perforation or obstruction. Negative for active GI bleeding by CTA. 2. No other acute or inflammatory process identified. Electronically Signed   By: Odessa Fleming M.D.   On: 09/21/2022 05:47    Pending Labs Unresulted Labs (From admission, onward)     Start     Ordered   09/22/22 0500  HIV Antibody (routine testing w rflx)  (HIV Antibody (Routine testing w reflex) panel)  Once,   R        09/22/22 0248   09/21/22 1900  Potassium  Once,   STAT         09/21/22 1632   09/21/22 1700  Sedimentation rate  Every Mon-Wed-Fri,   R      09/21/22 1700   09/21/22 1651  Hemoglobin and hematocrit, blood  Now then every 6 hours,   R (with STAT occurrences)      09/21/22 1651            Vitals/Pain Today's Vitals   09/22/22 0400 09/22/22 0500 09/22/22 0555 09/22/22 0700  BP: (!) 157/79 (!) 137/90  130/84  Pulse: (!) 51 (!) 50  (!) 58  Resp: 12 16  15   Temp:   97.6 F (36.4 C)   TempSrc:      SpO2: 100% 100%  100%  Weight:      Height:      PainSc:        Isolation Precautions No active isolations  Medications Medications  0.9 %  sodium chloride infusion (Manually program via  Guardrails IV Fluids) (0 mLs Intravenous Hold 09/21/22 0500)  tacrolimus (PROGRAF) capsule 2 mg (2 mg Oral Given 09/21/22 1839)  pravastatin (PRAVACHOL) tablet 40 mg (40 mg Oral Given 09/21/22 1839)  albuterol (PROVENTIL) (2.5 MG/3ML) 0.083% nebulizer solution 2.5 mg (has no administration in time range)  insulin aspart (novoLOG) injection 0-9 Units ( Subcutaneous Not Given 09/22/22 0422)  sodium phosphate (FLEET) 7-19 GM/118ML enema 1 enema (has no administration in time range)  methylPREDNISolone sodium succinate (SOLU-MEDROL) 40 mg/mL injection 40 mg (40 mg Intravenous Given 09/21/22 1842)  lactated ringers 1,000 mL with potassium chloride 20 mEq infusion ( Intravenous New Bag/Given 09/21/22 1842)  Ampicillin-Sulbactam (UNASYN) 3 g in sodium chloride 0.9 % 100 mL IVPB (0 g Intravenous Stopped 09/22/22 0707)  lactated ringers bolus 1,000 mL (0 mLs Intravenous Stopped 09/21/22 0707)  iohexol (OMNIPAQUE) 350 MG/ML injection 100 mL (100 mLs Intravenous Contrast Given 09/21/22 0511)  Ampicillin-Sulbactam (UNASYN) 3 g in sodium chloride 0.9 % 100 mL IVPB (0 g Intravenous Stopped 09/21/22 0734)  potassium chloride 10 mEq in 100 mL IVPB (0 mEq Intravenous Stopped 09/21/22 1153)    Mobility walks     Focused Assessments    R Recommendations: See Admitting Provider  Note  Report given to:   Additional Notes:  Endo will be coming to get him around 1000.

## 2022-09-22 NOTE — Op Note (Addendum)
[facility] Patient Name: [name] Procedure Date : 09/22/2022 MRN: [id number] Attending MD: Particia Lather , , [id number] Date of Birth: 01/14/1961 CSN: [id number] Age: 62 Admit Type: Inpatient Procedure:                Upper GI endoscopy Indications:              Anemia Providers:                Madelyn Brunner" Jesus Genera, RN, Eliberto Ivory, RN, Melany Guernsey, Technician Referring MD:             Hospitalist team Medicines:                Monitored Anesthesia Care Complications:            No immediate complications. Estimated Blood Loss:     Estimated blood loss was minimal. Procedure:                Pre-Anesthesia Assessment:                           - Prior to the procedure, a History and Physical                            was performed, and patient medications and                            allergies were reviewed. The patient's tolerance of                            previous anesthesia was also reviewed. The risks                            and benefits of the procedure and the sedation                            options and risks were discussed with the patient.                            All questions were answered, and informed consent                            was obtained. Prior Anticoagulants: The patient has                            taken no anticoagulant or antiplatelet agents. ASA                            Grade Assessment: III - A patient with severe                            systemic disease. After reviewing the risks and  benefits, the patient was deemed in satisfactory                            condition to undergo the procedure.                           After obtaining informed consent, the endoscope was                            passed under direct vision. Throughout the                            procedure, the patient's blood pressure, pulse, and                             oxygen saturations were monitored continuously. The                            [id number]-[id number] ([id number] Olympus endoscope was introduced                            through the mouth, and advanced to the second part                            of duodenum. The upper GI endoscopy was                            accomplished without difficulty. The patient                            tolerated the procedure well. Scope In: Scope Out: Findings:      The examined esophagus was normal.      Localized moderate inflammation with overlying hematin characterized by       congestion (edema), erosions and erythema was found in the gastric body       and in the gastric antrum. Biopsies were taken with a cold forceps for       histology.      Localized mild inflammation characterized by congestion (edema) and       erythema was found in the duodenal bulb. Biopsies were taken with a cold       forceps for histology. Impression:               - Normal esophagus.                           - Gastritis with hematin. Biopsied.                           - Duodenitis. Biopsied. Recommendation:           - Await pathology results.                           - Start PPI QD to help with healing of gastritis.                           -  Perform a colonoscopy today. Procedure Code(s):        --- Professional ---                           [phone], Esophagogastroduodenoscopy, flexible,                            transoral; with biopsy, single or multiple Diagnosis Code(s):        --- Professional ---                           K29.71, Gastritis, unspecified, with bleeding                           K29.80, Duodenitis without bleeding                           D64.9, Anemia, unspecified CPT copyright 2022 [employer]. All rights reserved. The codes documented in this report are preliminary and upon coder review may  be revised to meet current compliance requirements. Dr Particia Lather [address]"  [place],  09/22/2022 12:26:17 PM Number of Addenda: 0

## 2022-09-22 NOTE — Op Note (Signed)
[facility] Patient Name: [name] Procedure Date : 09/22/2022 MRN: [id number] Attending MD: Particia Lather , , [id number] Date of Birth: 07/15/1960 CSN: [id number] Age: 62 Admit Type: Inpatient Procedure:                Flexible Sigmoidoscopy Indications:              Hematochezia, Diarrhea Providers:                Madelyn Brunner" Jesus Genera, RN, Eliberto Ivory, RN, Melany Guernsey, Technician Referring MD:             Hospitalist team Medicines:                Monitored Anesthesia Care Complications:            No immediate complications. Estimated Blood Loss:     Estimated blood loss was minimal. Procedure:                Pre-Anesthesia Assessment:                           - Prior to the procedure, a History and Physical                            was performed, and patient medications and                            allergies were reviewed. The patient's tolerance of                            previous anesthesia was also reviewed. The risks                            and benefits of the procedure and the sedation                            options and risks were discussed with the patient.                            All questions were answered, and informed consent                            was obtained. Prior Anticoagulants: The patient has                            taken no anticoagulant or antiplatelet agents. ASA                            Grade Assessment: III - A patient with severe                            systemic disease. After reviewing the risks and  benefits, the patient was deemed in satisfactory                            condition to undergo the procedure.                           After obtaining informed consent, the scope was                            passed under direct vision. The [id number]-[id number] ([id number])                            Olympus endoscope was introduced through the  anus                            and advanced to the the left transverse colon. The                            flexible sigmoidoscopy was accomplished without                            difficulty. The patient tolerated the procedure                            well. Scope In: 12:00:45 PM Scope Out: 12:13:13 PM Total Procedure Duration: 0 hours 12 minutes 28 seconds  Findings:      Diffuse inflammation characterized by congestion (edema), granularity,       mucus and shallow ulcerations was found in the rectum, in the sigmoid       colon, in the descending colon and in the transverse colon. Biopsies       were taken with a cold forceps for histology.      Hemorrhoids were found on perianal exam. Impression:               - Diffuse inflammation was found in the rectum, in                            the sigmoid colon, in the descending colon and in                            the transverse colon. Biopsied.                           - Hemorrhoids found on perianal exam. Recommendation:           - Return patient to hospital ward for ongoing care.                           - Await pathology results.                           - Continue IV steroids.                           - The findings and recommendations were discussed  with the patient. Procedure Code(s):        --- Professional ---                           [phone], Sigmoidoscopy, flexible; with biopsy, single                            or multiple Diagnosis Code(s):        --- Professional ---                           K62.89, Other specified diseases of anus and rectum                           K52.9, Noninfective gastroenteritis and colitis,                            unspecified                           K64.9, Unspecified hemorrhoids                           K92.1, Melena (includes Hematochezia)                           R19.7, Diarrhea, unspecified CPT copyright 2022 [employer]. All  rights reserved. The codes documented in this report are preliminary and upon coder review may  be revised to meet current compliance requirements. Dr Particia Lather [address] [place],  09/22/2022 12:29:21 PM Number of Addenda: 0

## 2022-09-22 NOTE — Anesthesia Preprocedure Evaluation (Addendum)
Anesthesia Evaluation  Patient identified by MRN, date of birth, ID band Patient awake    Reviewed: Allergy & Precautions, NPO status , Patient's Chart, lab work & pertinent test results  History of Anesthesia Complications Negative for: history of anesthetic complications  Airway Mallampati: II  TM Distance: >3 FB Neck ROM: Full    Dental   Pulmonary neg shortness of breath, asthma , sleep apnea and Continuous Positive Airway Pressure Ventilation , neg COPD, neg recent URI, PE   Pulmonary exam normal breath sounds clear to auscultation       Cardiovascular hypertension, (-) angina + CAD and + CABG (04/03/2021)  (-) dysrhythmias + Valvular Problems/Murmurs (bicuspid aortic valve s/p AVR)  Rhythm:Regular Rate:Normal  HLD  TTE 05/15/2021: IMPRESSIONS    1. Left ventricular ejection fraction, by estimation, is 55 to 60%. The  left ventricle has normal function. The left ventricle has no regional  wall motion abnormalities. There is moderate concentric left ventricular  hypertrophy. Left ventricular  diastolic parameters are consistent with Grade II diastolic dysfunction  (pseudonormalization). Elevated left atrial pressure. The average left  ventricular global longitudinal strain is -16.3 %. The global longitudinal  strain is abnormal.   2. Right ventricular systolic function is normal. The right ventricular  size is normal. Tricuspid regurgitation signal is inadequate for assessing  PA pressure.   3. Left atrial size was mildly dilated.   4. The mitral valve is normal in structure. No evidence of mitral valve  regurgitation.   5. The aortic valve has been repaired/replaced. Aortic valve  regurgitation is not visualized. There is a 27 mm Inspiris Resilia  bioprosthetic valve present in the aortic position. Procedure Date:  04/03/21. Echo findings are consistent with normal  structure and function of the aortic valve prosthesis.  Aortic valve mean  gradient measures 6.0 mmHg. Aortic valve Vmax measures 1.66 m/s.   6. Aortic dilatation noted. There is borderline dilatation of the aortic  root, measuring 38 mm. There is mild dilatation of the ascending aorta,  measuring 41 mm.     Neuro/Psych  Headaches, neg Seizures  Anxiety        GI/Hepatic ,GERD  ,,(+) Cirrhosis  (s/p liver transplant 09/16/2018 at [facility])      Fatty liver   Endo/Other  diabetes, Type 2Hypothyroidism    Renal/GU Renal disease (h/o nephrolithiasis)     Musculoskeletal   Abdominal   Peds  Hematology  (+) Blood dyscrasia (Hgb 8.4), anemia   Anesthesia Other Findings 62 y.o. 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   Reproductive/Obstetrics                              Anesthesia Physical Anesthesia Plan  ASA: 3  Anesthesia Plan: MAC   Post-op Pain Management:    Induction: Intravenous  PONV Risk Score and Plan: 1 and Propofol infusion and Treatment may vary due to age or medical condition  Airway Management Planned: Natural Airway and Simple Face Mask  Additional Equipment:   Intra-op Plan:   Post-operative Plan: Extubation in OR  Informed Consent: I have reviewed the patients History and Physical, chart, labs and discussed the procedure including the risks, benefits and alternatives for the proposed anesthesia with the  patient or authorized representative who has indicated his/her understanding and acceptance.     Dental advisory given  Plan Discussed with: CRNA and Anesthesiologist  Anesthesia Plan Comments: (Discussed with patient risks of MAC including, but not limited to, minor pain or discomfort, hearing people in the  room, and possible need for backup general anesthesia. Risks for general anesthesia also discussed including, but not limited to, sore throat, hoarse voice, chipped/damaged teeth, injury to vocal cords, nausea and vomiting, allergic reactions, lung infection, heart attack, stroke, and death. All questions answered. )         Anesthesia Quick Evaluation

## 2022-09-22 NOTE — Interval H&P Note (Signed)
History and Physical Interval Note:  09/22/2022 10:50 AM  [name]  has presented today for surgery, with the diagnosis of anemia, hematochezia.  The various methods of treatment have been discussed with the patient and family. After consideration of risks, benefits and other options for treatment, the patient has consented to  Procedure(s): ESOPHAGOGASTRODUODENOSCOPY (EGD) WITH PROPOFOL (N/A) FLEXIBLE SIGMOIDOSCOPY (N/A) as a surgical intervention.  The patient's history has been reviewed, patient examined, no change in status, stable for surgery.  I have reviewed the patient's chart and labs.  Questions were answered to the patient's satisfaction.     Imogene Burn

## 2022-09-23 DIAGNOSIS — J45909 Unspecified asthma, uncomplicated: Secondary | ICD-10-CM

## 2022-09-23 DIAGNOSIS — R571 Hypovolemic shock: Secondary | ICD-10-CM

## 2022-09-23 DIAGNOSIS — Z944 Liver transplant status: Secondary | ICD-10-CM | POA: Diagnosis not present

## 2022-09-23 DIAGNOSIS — E119 Type 2 diabetes mellitus without complications: Secondary | ICD-10-CM | POA: Diagnosis not present

## 2022-09-23 DIAGNOSIS — K529 Noninfective gastroenteritis and colitis, unspecified: Secondary | ICD-10-CM | POA: Diagnosis not present

## 2022-09-23 DIAGNOSIS — K921 Melena: Secondary | ICD-10-CM | POA: Diagnosis not present

## 2022-09-23 DIAGNOSIS — I10 Essential (primary) hypertension: Secondary | ICD-10-CM | POA: Diagnosis not present

## 2022-09-23 LAB — BASIC METABOLIC PANEL
Anion gap: 15 (ref 5–15)
BUN: 16 mg/dL (ref 6–20)
CO2: 21 mmol/L — ABNORMAL LOW (ref 22–32)
Calcium: 7.9 mg/dL — ABNORMAL LOW (ref 8.9–10.3)
Chloride: 100 mmol/L (ref 98–111)
Creatinine, Ser: 0.99 mg/dL (ref 0.61–1.24)
GFR, Estimated: >60 mL/min (ref >60)
Glucose, Bld: 101 mg/dL — ABNORMAL HIGH (ref 70–99)
Potassium: 3.2 mmol/L — ABNORMAL LOW (ref 3.5–5.1)
Sodium: 136 mmol/L (ref 135–145)

## 2022-09-23 LAB — GLUCOSE, CAPILLARY
Glucose-Capillary: 65 mg/dL — ABNORMAL LOW (ref 70–99)
Glucose-Capillary: 90 mg/dL (ref 70–99)
Glucose-Capillary: 94 mg/dL (ref 70–99)
Glucose-Capillary: 140 mg/dL — ABNORMAL HIGH (ref 70–99)
Glucose-Capillary: 252 mg/dL — ABNORMAL HIGH (ref 70–99)
Glucose-Capillary: 223 mg/dL — ABNORMAL HIGH (ref 70–99)

## 2022-09-23 MED ORDER — POTASSIUM CHLORIDE 20 MEQ PO PACK
40.0000 meq | PACK | Freq: Once | ORAL | Status: AC
  Administered 2022-09-23: 40 meq via ORAL
  Filled 2022-09-23: qty 2

## 2022-09-23 NOTE — Progress Notes (Signed)
    Gastroenterology Inpatient Follow Up    Subjective: Patient states that his diarrhea appears to be improving.  Denies any blood in his stools currently.  Abdominal pain is still present but may be improved as well.  Objective: Vital signs in last 24 hours: Temp:  [98.1 F (36.7 C)-98.4 F (36.9 C)] 98.4 F (36.9 C) (07/31 1257) Pulse Rate:  [54-68] 68 (07/31 1257) Resp:  [16-18] 18 (07/31 1257) BP: (98-124)/(66-76) 102/68 (07/31 1257) SpO2:  [100 %] 100 % (07/31 1257) FiO2 (%):  [21 %] 21 % (07/30 2234) Last BM Date : 09/22/22  Intake/Output from previous day: 07/30 0701 - 07/31 0700 In: 1135 [I.V.:750; IV Piggyback:385] Out: -  Intake/Output this shift: No intake/output data recorded.  General appearance: alert and cooperative Resp: no increased WOB Cardio: regular rate GI: tender in the lower abdomen  Lab Results: Recent Labs    09/21/22 0326 09/21/22 1635 09/21/22 2217 09/22/22 0536 09/22/22 0857  WBC 22.4* 5.9  --  5.2  --   HGB 7.6* 7.6* 8.0* 8.0* 8.4*  HCT 25.3* 24.7* 25.1* 26.1* 26.8*  PLT 437* 308  --  319  --    BMET Recent Labs    09/21/22 1635 09/22/22 0536 09/22/22 0857 09/23/22 0109  NA 136 136  --  136  K 3.1* 3.4* 4.0 3.2*  CL 103 104  --  100  CO2 19* 23  --  21*  GLUCOSE 150* 155*  --  101*  BUN 15 13  --  16  CREATININE 0.92 0.91  --  0.99  CALCIUM 7.8* 7.9*  --  7.9*   LFT Recent Labs    09/21/22 0326  PROT 5.1*  ALBUMIN 1.9*  AST 13*  ALT 15  ALKPHOS 61  BILITOT 0.3   PT/INR Recent Labs    09/21/22 0326  LABPROT 15.9*  INR 1.3*   Hepatitis Panel No results for input(s): "HEPBSAG", "HCVAB", "HEPAIGM", "HEPBIGM" in the last 72 hours. C-Diff No results for input(s): "CDIFFTOX" in the last 72 hours.  Studies/Results: No results found.  Medications: I have reviewed the patient's current medications. Scheduled:  sodium chloride   Intravenous Once   insulin aspart  0-9 Units Subcutaneous Q4H   levothyroxine   175 mcg Oral Q0600   methylPREDNISolone (SOLU-MEDROL) injection  40 mg Intravenous Daily   pantoprazole  40 mg Oral Daily   pravastatin  40 mg Oral QPM   sertraline  50 mg Oral Daily   tacrolimus  2 mg Oral BID   Continuous:  ampicillin-sulbactam (UNASYN) IV 3 g (09/23/22 1118)   lactated ringers 1,000 mL with potassium chloride 20 mEq infusion 75 mL/hr at 09/23/22 1117   ZOX:[id number]  Assessment/Plan: 62 year old male with history of liver transplant in 2020, OSA on CPAP, aortic stenosis s/p aortic valve replacement, CAD, DM, PE, and asthma presented with bloody diarrhea due to colitis presumably related to use of CellCept or IBD.  Flexible sigmoidoscopy was done on 7/30 to rule out CMV.  Biopsies are still pending at this time.  CRP does appear to be improving slightly today from 8.5 to 5.7.  Patient also symptomatically states that he is getting better slowly.  He no longer has any blood in stools.  Hemoglobin has remained stable.  EGD showed some gastritis and duodenitis for which he is currently being treated with PPI therapy.   LOS: 1 day   Imogene Burn 09/23/2022, 2:34 PM

## 2022-09-23 NOTE — Plan of Care (Signed)
   Problem: Coping: Goal: Ability to adjust to condition or change in health will improve Outcome: Progressing   Problem: Nutritional: Goal: Maintenance of adequate nutrition will improve Outcome: Progressing

## 2022-09-23 NOTE — Progress Notes (Signed)
[facility]  PROGRESS NOTE  [name] EAV:[id number] DOB: [birth year] DOA: 09/21/2022 PCP: Joaquim Nam, MD   Brief HPI:    62 y.o. 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  Initial plan was to transfer to [facility], however patient decided to stay here and undergo  endoscopy and flexible sigmoidoscopy. Patient began to have diarrhea after start Ozempic in May. Had CT abdomen in June demonstrating moderate severity descending and sigmoid colitis. He was treated with multiple course of antibiotics without improvement. Had several C.diff test that was negative. Negative stool pathogen test. Negative EBV, CMV, and HSV.  Subsequently underwent colonoscopy on 09/09/2022 with severe mucosal changes characterized by edema, erythema, friability, deep ulcerations and shallow ulcerations throughout the entire colon. Pathology from biopsy revealed CMV immunostain shows only rare positive cells in lamina propria. No conspicuous evidence of viropathic change is seen on H&E stain. HSV1/2 immunostain is negative. Patient was then started on prolonged steroid taper and prophylaxic Bactrim. He has some improvement of his diarrhea and energy level on steroids. However yesterday had bloody diarrhea so decided to present to ED. Has mostly pain to rectal region due to hemorroids. Also has lower abdominal tenderness. He is on Cellcept and Prograf for his liver transplant but was told by [facility] to discontinue Cellcept 2 days ago due to concerns for Cellcept induced colitis.   In the ED CTA abdomen showed widespread colitis but no acute GI extravasation.  He was started on IV Unasyn and was seen in consultation by [facility] who recommended to start IV steroids  with plan for flexible sigmoidoscopy and endoscopy today.  He was also transfused 2 units PRBC.   Assessment/Plan:   Recurrent colitis -In the setting of immunosuppressant therapy for liver transplant -Failed outpatient treatment with p.o. antibiotics and steroids -CT imaging shows worsening colitis as compared to June CT scan -Initial plan was to transfer to [facility] however patient decided to stay at [facility] consulted, patient underwent EGD and colonoscopy -EGD showed mild gastritis/duodenitis and colonoscopy showed diffuse colitis, biopsy obtained -Continue pantoprazole 40 mg daily -Continue Solu-Medrol 40 mg IV daily  Acute blood loss anemia -Hemoglobin was 7.6 with baseline hemoglobin around 12 in setting of colitis and bloody stool -S/p 2 units PRBC -Hemoglobin is up to 8.4 today -Follow CBC in a.m. and transfuse for Hb less than 7  S/p AVR -AVR with Inspiriris Resilia pericardial valve on 04/03/21  Looks to be on full dose ASA  Hold for now pending evaluation for recurrent colitis with acute blood loss anemia  Coronary artery disease involving native coronary artery of native heart without angina pectoris Pt s/p CABG x 1 with LIMA to LAD for 80% stenosis  On high dose ASA and statin  Holding statin for now in setting of colitis and bleeding   HLD (hyperlipidemia) Resume statin as colitis improves     Liver transplant recipient [facility]) S/p liver transplant 08/2018 for crytogenic cirrhosis  -LFTs within normal limits On cellcept and prograf - Cellcept was discontinued on 7/27 by [facility] due to concern for MMF-induced colitis Continue Prograf     Diabetes mellitus without complication (HCC) SSI  -CBG well-controlled   OSA (obstructive sleep apnea) Cpap   Asthma Stable from a resp standpoint   Essential  hypertension -Blood pressure is soft -Metoprolol on hold   Hypothyroidism -continue Synthroid   Hypokalemia -Potassium is 3.2 -Replace potassium and follow BMP  in am  Medications     sodium chloride   Intravenous Once   insulin aspart  0-9 Units Subcutaneous Q4H   levothyroxine  175 mcg Oral Q0600   methylPREDNISolone (SOLU-MEDROL) injection  40 mg Intravenous Daily   pantoprazole  40 mg Oral Daily   pravastatin  40 mg Oral QPM   sertraline  50 mg Oral Daily   tacrolimus  2 mg Oral BID     Data Reviewed:   CBG:  Recent Labs  Lab 09/23/22 0407 09/23/22 0456 09/23/22 0726 09/23/22 1214 09/23/22 1631  GLUCAP 65* 90 94 140* 252*    SpO2: 100 % FiO2 (%): 21 %    Vitals:   09/22/22 2234 09/23/22 0409 09/23/22 0727 09/23/22 1257  BP:  112/72 124/76 102/68  Pulse: 60 (!) 54 (!) 55 68  Resp: 18 16 18 18   Temp:   98.1 F (36.7 C) 98.4 F (36.9 C)  TempSrc:   Oral Oral  SpO2:  100% 100% 100%  Weight:      Height:          Data Reviewed:  Basic Metabolic Panel: Recent Labs  Lab 09/21/22 0326 09/21/22 1633 09/21/22 1635 09/22/22 0536 09/22/22 0857 09/23/22 0109  NA 135  --  136 136  --  136  K 2.7*  --  3.1* 3.4* 4.0 3.2*  CL 106  --  103 104  --  100  CO2 18*  --  19* 23  --  21*  GLUCOSE 224*  --  150* 155*  --  101*  BUN 19  --  15 13  --  16  CREATININE 1.10  --  0.92 0.91  --  0.99  CALCIUM 7.8*  --  7.8* 7.9*  --  7.9*  MG  --  1.5*  --   --   --   --     CBC: Recent Labs  Lab 09/21/22 0326 09/21/22 1635 09/21/22 2217 09/22/22 0536 09/22/22 0857  WBC 22.4* 5.9  --  5.2  --   HGB 7.6* 7.6* 8.0* 8.0* 8.4*  HCT 25.3* 24.7* 25.1* 26.1* 26.8*  MCV 83.2 83.7  --  83.7  --   PLT 437* 308  --  319  --     LFT Recent Labs  Lab 09/21/22 0326  AST 13*  ALT 15  ALKPHOS 61  BILITOT 0.3  PROT 5.1*  ALBUMIN 1.9*     Antibiotics: Anti-infectives (From admission, onward)    Start     Dose/Rate Route Frequency Ordered Stop   09/21/22 2030  Ampicillin-Sulbactam (UNASYN) 3 g in sodium chloride 0.9 % 100 mL IVPB        3 g 200 mL/hr over 30 Minutes Intravenous Every 6 hours 09/21/22 2029      09/21/22 0600  Ampicillin-Sulbactam (UNASYN) 3 g in sodium chloride 0.9 % 100 mL IVPB        3 g 200 mL/hr over 30 Minutes Intravenous  Once 09/21/22 0553 09/21/22 0734        DVT prophylaxis: SCDs  Code Status: Full code  Family Communication:    CONSULTS gastroenterology   Subjective   Diarrhea is improving.   Objective    Physical Examination:  General-appears in no acute distress Heart-S1-S2, regular, no murmur auscultated Lungs-clear to auscultation bilaterally, no wheezing or crackles auscultated  Abdomen-soft, nontender, no organomegaly Extremities-no edema in the lower extremities Neuro-alert, oriented x3, no focal deficit noted  Status is: Inpatient:      Meredeth Ide   [facility] If 7PM-7AM, please contact night-coverage at [email or web address], Office  [phone]   09/23/2022, 5:48 PM  LOS: 1 day

## 2022-09-23 NOTE — Unmapped (Signed)
TRF

## 2022-09-23 NOTE — Progress Notes (Signed)
PT 0400 CBG 65, provided juice and carbs with repeat CBG of 90, Pt remains asx. CBG continue to be checked q4hours.

## 2022-09-24 DIAGNOSIS — Z944 Liver transplant status: Secondary | ICD-10-CM | POA: Diagnosis not present

## 2022-09-24 DIAGNOSIS — K921 Melena: Secondary | ICD-10-CM | POA: Diagnosis not present

## 2022-09-24 DIAGNOSIS — K922 Gastrointestinal hemorrhage, unspecified: Secondary | ICD-10-CM | POA: Diagnosis not present

## 2022-09-24 DIAGNOSIS — K529 Noninfective gastroenteritis and colitis, unspecified: Secondary | ICD-10-CM | POA: Diagnosis not present

## 2022-09-24 DIAGNOSIS — J45909 Unspecified asthma, uncomplicated: Secondary | ICD-10-CM | POA: Diagnosis not present

## 2022-09-24 LAB — CBC
HCT: 21.3 % — ABNORMAL LOW (ref 39.0–52.0)
Hemoglobin: 6.6 g/dL — CL (ref 13.0–17.0)
MCH: 27.2 pg (ref 26.0–34.0)
MCHC: 31 g/dL (ref 30.0–36.0)
MCV: 87.7 fL (ref 80.0–100.0)
Platelets: 370 10*3/uL (ref 150–400)
RBC: 2.43 MIL/uL — ABNORMAL LOW (ref 4.22–5.81)
RDW: 16.4 % — ABNORMAL HIGH (ref 11.5–15.5)
WBC: 13.9 10*3/uL — ABNORMAL HIGH (ref 4.0–10.5)
nRBC: 0 % (ref 0.0–0.2)

## 2022-09-24 LAB — GLUCOSE, CAPILLARY
Glucose-Capillary: 102 mg/dL — ABNORMAL HIGH (ref 70–99)
Glucose-Capillary: 112 mg/dL — ABNORMAL HIGH (ref 70–99)
Glucose-Capillary: 118 mg/dL — ABNORMAL HIGH (ref 70–99)
Glucose-Capillary: 125 mg/dL — ABNORMAL HIGH (ref 70–99)
Glucose-Capillary: 127 mg/dL — ABNORMAL HIGH (ref 70–99)
Glucose-Capillary: 138 mg/dL — ABNORMAL HIGH (ref 70–99)
Glucose-Capillary: 181 mg/dL — ABNORMAL HIGH (ref 70–99)
Glucose-Capillary: 231 mg/dL — ABNORMAL HIGH (ref 70–99)

## 2022-09-24 LAB — PREPARE RBC (CROSSMATCH)

## 2022-09-24 MED ORDER — SODIUM CHLORIDE 0.9 % IV BOLUS
500.0000 mL | Freq: Once | INTRAVENOUS | Status: AC
  Administered 2022-09-24: 500 mL via INTRAVENOUS

## 2022-09-24 MED ORDER — POTASSIUM CHLORIDE CRYS ER 20 MEQ PO TBCR
40.0000 meq | EXTENDED_RELEASE_TABLET | Freq: Once | ORAL | Status: AC
  Administered 2022-09-24: 40 meq via ORAL
  Filled 2022-09-24: qty 2

## 2022-09-24 MED ORDER — SODIUM CHLORIDE 0.9% IV SOLUTION: Freq: Once | INTRAVENOUS | Status: AC

## 2022-09-24 NOTE — Plan of Care (Signed)
  Problem: Coping: Goal: Ability to adjust to condition or change in health will improve Outcome: Progressing   Problem: Fluid Volume: Goal: Ability to maintain a balanced intake and output will improve Outcome: Progressing   Problem: Metabolic: Goal: Ability to maintain appropriate glucose levels will improve Outcome: Progressing   

## 2022-09-24 NOTE — Progress Notes (Signed)
     [facility] Progress Note  CC:  Diarrhea, colitis  Subjective:  Had a rough morning.  4-5 BMs overnight and then had 3 BMs with quite a bit of blood.  Had a syncopal episode and fell.  BPs in the 70s and 80s systolic.  Just rechecked BP and better now at 110/91.  Objective:  Vital signs in last 24 hours: Temp:  [97.6 F (36.4 C)-98.4 F (36.9 C)] 97.6 F (36.4 C) (08/01 0909) Pulse Rate:  [66-77] 74 (08/01 0909) Resp:  [15-18] 15 (08/01 0909) BP: (78-130)/(59-91) 110/91 (08/01 1000) SpO2:  [97 %-100 %] 98 % (08/01 0909) FiO2 (%):  [21 %] 21 % (07/31 2221) Last BM Date : 09/23/22 General:  Alert, Well-developed, in NAD Heart:  Regular rate and rhythm; no murmurs Pulm:  CTAB.  No W/R/R. Abdomen:  Soft, non-distended.  BS present.  Mild diffuse TTP. Extremities:  Without edema. Neurologic:  Alert and oriented x 4;  grossly normal neurologically. Psych:  Alert and cooperative. Normal mood and affect.  Intake/Output from previous day: 07/31 0701 - 08/01 0700 In: 100 [IV Piggyback:100] Out: -   Lab Results: Recent Labs    09/21/22 1635 09/21/22 2217 09/22/22 0536 09/22/22 0857 09/24/22 0424  WBC 5.9  --  5.2  --  5.8  HGB 7.6*   < > 8.0* 8.4* 8.1*  HCT 24.7*   < > 26.1* 26.8* 25.6*  PLT 308  --  319  --  383   < > = values in this interval not displayed.   BMET Recent Labs    09/22/22 0536 09/22/22 0857 09/23/22 0109 09/24/22 0424  NA 136  --  136 138  K 3.4* 4.0 3.2* 3.4*  CL 104  --  100 103  CO2 23  --  21* 26  GLUCOSE 155*  --  101* 103*  BUN 13  --  16 11  CREATININE 0.91  --  0.99 0.94  CALCIUM 7.9*  --  7.9* 7.8*   LFT Recent Labs    09/24/22 0424  PROT 4.9*  ALBUMIN 1.8*  AST 12*  ALT 13  ALKPHOS 55  BILITOT 0.3   Assessment / Plan: 62 year old male with history of liver transplant in 2020, OSA on CPAP, aortic stenosis s/p aortic valve replacement, CAD, DM, PE, and asthma presented with bloody diarrhea due to colitis  presumably related to use of CellCept or IBD.  Flexible sigmoidoscopy was done on 7/30 to rule out CMV.  Biopsies are still pending at this time.  Is on Solumedol 40 mg daily.  CRP does appear to be improving slightly today from 8.5 to 5.7 and now down to 3.9 today.  Patient unfortunately feeling poorly this AM with recurrent bleeding and hypotension with syncopal episode/fall.  Hgb has remained stable, but the last was early this AM so will check again today.  EGD showed some gastritis and duodenitis for which he is currently being treated with PPI therapy.    LOS: 2 days   Princella Pellegrini. Gurpreet Mariani  09/24/2022, 10:56 AM

## 2022-09-24 NOTE — Progress Notes (Signed)
[facility]  PROGRESS NOTE  ORR VISCOMI UEA:[id number] DOB: 06/04/1960 DOA: 09/21/2022 PCP: Joaquim Nam, MD   Brief HPI:    62 y.o. 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  Initial plan was to transfer to [facility], however patient decided to stay here and undergo  endoscopy and flexible sigmoidoscopy. Patient began to have diarrhea after start Ozempic in May. Had CT abdomen in June demonstrating moderate severity descending and sigmoid colitis. He was treated with multiple course of antibiotics without improvement. Had several C.diff test that was negative. Negative stool pathogen test. Negative EBV, CMV, and HSV.  Subsequently underwent colonoscopy on 09/09/2022 with severe mucosal changes characterized by edema, erythema, friability, deep ulcerations and shallow ulcerations throughout the entire colon. Pathology from biopsy revealed CMV immunostain shows only rare positive cells in lamina propria. No conspicuous evidence of viropathic change is seen on H&E stain. HSV1/2 immunostain is negative. Patient was then started on prolonged steroid taper and prophylaxic Bactrim. He has some improvement of his diarrhea and energy level on steroids. However yesterday had bloody diarrhea so decided to present to ED. Has mostly pain to rectal region due to hemorroids. Also has lower abdominal tenderness. He is on Cellcept and Prograf for his liver transplant but was told by [facility] to discontinue Cellcept 2 days ago due to concerns for Cellcept induced colitis.   In the ED CTA abdomen showed widespread colitis but no acute GI extravasation.  He was started on IV Unasyn and was seen in consultation by [facility] who recommended to start IV steroids  with plan for flexible sigmoidoscopy and endoscopy today.  He was also transfused 2 units PRBC.   Assessment/Plan:   Recurrent colitis -In the setting of immunosuppressant therapy for liver transplant -Failed outpatient treatment with p.o. antibiotics and steroids -CT imaging shows worsening colitis as compared to June CT scan -Initial plan was to transfer to [facility] however patient decided to stay at [facility] consulted, patient underwent EGD and colonoscopy -EGD showed mild gastritis/duodenitis and colonoscopy showed diffuse colitis, biopsy obtained -Continue pantoprazole 40 mg daily -Continue Solu-Medrol 40 mg IV daily -Had 1 episode of bloody bowel movement this morning, pathology from the biopsy showed moderately active colitis, negative for CMV.  Gastric and duodenal biopsies show inflammation but negative for H. pylori infection  Syncope -Patient had vasovagal syncope while sitting on commode in the bathroom -Blood pressure dropped to 78/61, improved with normal saline bolus -Continue LR at 100 mm/h  Acute blood loss anemia -Hemoglobin was 7.6 with baseline hemoglobin around 12 in setting of colitis and bloody stool -S/p 2 units PRBC -Hemoglobin is up to 8.4 today -Follow CBC in a.m. and transfuse for Hb less than 7  S/p AVR -AVR with Inspiriris Resilia pericardial valve on 04/03/21  Looks to be on full dose ASA  Hold for now pending evaluation for recurrent colitis with acute blood loss anemia  Coronary artery disease involving native coronary artery of native heart without angina pectoris Pt s/p CABG x 1 with LIMA to LAD for 80% stenosis  On high dose ASA and statin  Holding statin for now in setting of colitis and bleeding   HLD (hyperlipidemia) Resume statin as colitis improves     Liver transplant recipient [facility]) S/p liver  transplant 08/2018 for crytogenic cirrhosis  -LFTs within normal limits On cellcept and prograf - Cellcept was discontinued on 7/27 by [facility] due  to concern for MMF-induced colitis Continue Prograf     Diabetes mellitus without complication (HCC) SSI  -CBG well-controlled   OSA (obstructive sleep apnea) Cpap   Asthma Stable from a resp standpoint   Essential hypertension -Blood pressure is soft -Metoprolol on hold   Hypothyroidism -continue Synthroid   Hypokalemia -Potassium is 3.4 -Replace potassium for BMP in am  Medications     sodium chloride   Intravenous Once   insulin aspart  0-9 Units Subcutaneous Q4H   levothyroxine  175 mcg Oral Q0600   methylPREDNISolone (SOLU-MEDROL) injection  40 mg Intravenous Daily   pantoprazole  40 mg Oral Daily   pravastatin  40 mg Oral QPM   sertraline  50 mg Oral Daily   tacrolimus  2 mg Oral BID     Data Reviewed:   CBG:  Recent Labs  Lab 09/24/22 0009 09/24/22 0405 09/24/22 0720 09/24/22 0858 09/24/22 1141  GLUCAP 112* 102* 118* 125* 138*    SpO2: 98 % FiO2 (%): 21 %    Vitals:   09/24/22 0721 09/24/22 0855 09/24/22 0909 09/24/22 1000  BP: 103/71 (!) 78/61 (!) 81/59 (!) 110/91  Pulse: 66  74   Resp: 17  15   Temp: 98 F (36.7 C)  97.6 F (36.4 C)   TempSrc:   Oral   SpO2: 100%  98%   Weight:      Height:          Data Reviewed:  Basic Metabolic Panel: Recent Labs  Lab 09/21/22 0326 09/21/22 1633 09/21/22 1635 09/22/22 0536 09/22/22 0857 09/23/22 0109 09/24/22 0424  NA 135  --  136 136  --  136 138  K 2.7*  --  3.1* 3.4* 4.0 3.2* 3.4*  CL 106  --  103 104  --  100 103  CO2 18*  --  19* 23  --  21* 26  GLUCOSE 224*  --  150* 155*  --  101* 103*  BUN 19  --  15 13  --  16 11  CREATININE 1.10  --  0.92 0.91  --  0.99 0.94  CALCIUM 7.8*  --  7.8* 7.9*  --  7.9* 7.8*  MG  --  1.5*  --   --   --   --   --     CBC: Recent Labs  Lab 09/21/22 0326 09/21/22 1635 09/21/22 2217 09/22/22 0536 09/22/22 0857 09/24/22 0424  WBC 22.4* 5.9  --  5.2  --  5.8  HGB 7.6* 7.6* 8.0* 8.0* 8.4* 8.1*  HCT 25.3* 24.7* 25.1* 26.1* 26.8* 25.6*   MCV 83.2 83.7  --  83.7  --  85.6  PLT 437* 308  --  319  --  383    LFT Recent Labs  Lab 09/21/22 0326 09/24/22 0424  AST 13* 12*  ALT 15 13  ALKPHOS 61 55  BILITOT 0.3 0.3  PROT 5.1* 4.9*  ALBUMIN 1.9* 1.8*     Antibiotics: Anti-infectives (From admission, onward)    Start     Dose/Rate Route Frequency Ordered Stop   09/21/22 2030  Ampicillin-Sulbactam (UNASYN) 3 g in sodium chloride 0.9 % 100 mL IVPB        3 g 200 mL/hr over 30 Minutes Intravenous Every 6 hours 09/21/22 2029     09/21/22 0600  Ampicillin-Sulbactam (UNASYN) 3  g in sodium chloride 0.9 % 100 mL IVPB        3 g 200 mL/hr over 30 Minutes Intravenous  Once 09/21/22 0553 09/21/22 0734        DVT prophylaxis: SCDs  Code Status: Full code  Family Communication:    CONSULTS gastroenterology   Subjective   This morning patient had bloody stool and also passed out in the bathroom.  Blood pressure was low at 78/61.  Improved after fluid bolus of normal saline at 500 cc x 1   Objective    Physical Examination:  General-appears in no acute distress Heart-S1-S2, regular, no murmur auscultated Lungs-clear to auscultation bilaterally, no wheezing or crackles auscultated Abdomen-soft, nontender, no organomegaly Extremities-no edema in the lower extremities Neuro-alert, oriented x3, no focal deficit noted  Status is: Inpatient:      Meredeth Ide   [facility] If 7PM-7AM, please contact night-coverage at [email or web address], Office  [phone]   09/24/2022, 3:28 PM  LOS: 2 days

## 2022-09-24 NOTE — Progress Notes (Signed)
The patient experienced an unwitnessed fall and was discovered on the floor next to the bed in a Fowler's position. The patient reports that he was walking back from the bathroom when he lost consciousness and fell. He has been experiencing multiple bloody bowel movements and is unsure if he hit his head but denies any pain. An assessment for injuries was conducted, and no injuries were found. The patient's blood pressure was recorded at 78/61. The physician has been notified, and a new order for an IV bolus has been received.

## 2022-09-24 NOTE — Plan of Care (Signed)

## 2022-09-25 ENCOUNTER — Encounter (HOSPITAL_COMMUNITY): Payer: Self-pay | Admitting: Internal Medicine

## 2022-09-25 DIAGNOSIS — K922 Gastrointestinal hemorrhage, unspecified: Secondary | ICD-10-CM | POA: Diagnosis not present

## 2022-09-25 DIAGNOSIS — Z944 Liver transplant status: Secondary | ICD-10-CM | POA: Diagnosis not present

## 2022-09-25 DIAGNOSIS — K921 Melena: Secondary | ICD-10-CM | POA: Diagnosis not present

## 2022-09-25 DIAGNOSIS — K529 Noninfective gastroenteritis and colitis, unspecified: Secondary | ICD-10-CM | POA: Diagnosis not present

## 2022-09-25 DIAGNOSIS — J45909 Unspecified asthma, uncomplicated: Secondary | ICD-10-CM | POA: Diagnosis not present

## 2022-09-25 LAB — SPECIMEN STATUS REPORT

## 2022-09-25 LAB — GLUCOSE, CAPILLARY
Glucose-Capillary: 119 mg/dL — ABNORMAL HIGH (ref 70–99)
Glucose-Capillary: 112 mg/dL — ABNORMAL HIGH (ref 70–99)
Glucose-Capillary: 180 mg/dL — ABNORMAL HIGH (ref 70–99)
Glucose-Capillary: 234 mg/dL — ABNORMAL HIGH (ref 70–99)
Glucose-Capillary: 218 mg/dL — ABNORMAL HIGH (ref 70–99)
Glucose-Capillary: 190 mg/dL — ABNORMAL HIGH (ref 70–99)

## 2022-09-25 NOTE — Progress Notes (Signed)
Pt will place self on CPAP when he is ready.

## 2022-09-25 NOTE — Progress Notes (Signed)
[facility]  PROGRESS NOTE  [name] ZOX:[id number] DOB: 05-Nov-1960 DOA: 09/21/2022 PCP: Joaquim Nam, MD   Brief HPI:    62 y.o. 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  Initial plan was to transfer to [facility], however patient decided to stay here and undergo  endoscopy and flexible sigmoidoscopy. Patient began to have diarrhea after start Ozempic in May. Had CT abdomen in June demonstrating moderate severity descending and sigmoid colitis. He was treated with multiple course of antibiotics without improvement. Had several C.diff test that was negative. Negative stool pathogen test. Negative EBV, CMV, and HSV.  Subsequently underwent colonoscopy on 09/09/2022 with severe mucosal changes characterized by edema, erythema, friability, deep ulcerations and shallow ulcerations throughout the entire colon. Pathology from biopsy revealed CMV immunostain shows only rare positive cells in lamina propria. No conspicuous evidence of viropathic change is seen on H&E stain. HSV1/2 immunostain is negative. Patient was then started on prolonged steroid taper and prophylaxic Bactrim. He has some improvement of his diarrhea and energy level on steroids. However yesterday had bloody diarrhea so decided to present to ED. Has mostly pain to rectal region due to hemorroids. Also has lower abdominal tenderness. He is on Cellcept and Prograf for his liver transplant but was told by [facility] to discontinue Cellcept 2 days ago due to concerns for Cellcept induced colitis.   In the ED CTA abdomen showed widespread colitis but no acute GI extravasation.  He was started on IV Unasyn and was seen in consultation by [facility] who recommended to start IV steroids  with plan for flexible sigmoidoscopy and endoscopy today.  He was also transfused 2 units PRBC.   Assessment/Plan:   Recurrent colitis -In the setting of immunosuppressant therapy for liver transplant -Failed outpatient treatment with p.o. antibiotics and steroids -CT imaging shows worsening colitis as compared to June CT scan -Initial plan was to transfer to [facility] however patient decided to stay at [facility] consulted, patient underwent EGD and colonoscopy -EGD showed mild gastritis/duodenitis and colonoscopy showed diffuse colitis, biopsy obtained -Continue pantoprazole 40 mg daily -Continue Solu-Medrol 40 mg IV daily -Continues to have bloody bowel movements - pathology from the biopsy showed moderately active colitis, negative for CMV.  Gastric and duodenal biopsies show inflammation but negative for H. pylori infection -Discussed with gastroenterology, she has called up and discussed with transplant hepatologist, Dr. Dorris Carnes removed at [facility].  He recommends patient to be transferred to [facility] for further management. -Follow CRP, continue IV steroids -Transfer to [facility] has been initiated  Syncope -Patient had vasovagal syncope while sitting on commode in the bathroom -Blood pressure dropped to 78/61, improved with normal saline bolus -Continue LR at 100 ml/h  Acute blood loss anemia -Hemoglobin was 7.6 with baseline hemoglobin around 12 in setting of colitis and bloody stool -S/p 2 units PRBC -Hemoglobin is up to 7.3 today -Follow CBC in a.m. and transfuse for Hb less than 7  S/p AVR -AVR with Inspiriris Resilia pericardial valve on 04/03/21  Looks to be on full dose ASA  Hold for now pending evaluation for recurrent colitis with acute blood loss anemia  Coronary artery disease involving native coronary artery of native heart without angina pectoris Pt s/p CABG x 1 with LIMA to LAD for 80%  stenosis  On high dose ASA and statin  Holding statin for now in setting of colitis and  bleeding   HLD (hyperlipidemia) Resume statin as colitis improves     Liver transplant recipient [facility]) S/p liver transplant 08/2018 for crytogenic cirrhosis  -LFTs within normal limits On cellcept and prograf - Cellcept was discontinued on 7/27 by [facility] due to concern for MMF-induced colitis Continue Prograf     Diabetes mellitus without complication (HCC) SSI  -CBG well-controlled   OSA (obstructive sleep apnea) Cpap   Asthma Stable from a resp standpoint   Essential hypertension -Blood pressure is soft -Metoprolol on hold   Hypothyroidism -continue Synthroid   Hypokalemia -Replete  Medications     sodium chloride   Intravenous Once   insulin aspart  0-9 Units Subcutaneous Q4H   levothyroxine  175 mcg Oral Q0600   methylPREDNISolone (SOLU-MEDROL) injection  40 mg Intravenous Daily   pantoprazole  40 mg Oral Daily   pravastatin  40 mg Oral QPM   sertraline  50 mg Oral Daily   tacrolimus  2 mg Oral BID     Data Reviewed:   CBG:  Recent Labs  Lab 09/24/22 1709 09/24/22 2052 09/24/22 2354 09/25/22 0439 09/25/22 0800  GLUCAP 181* 231* 127* 119* 112*    SpO2: 100 % FiO2 (%): 21 %    Vitals:   09/24/22 2121 09/24/22 2357 09/25/22 0441 09/25/22 0802  BP: 97/68 104/70 125/82 131/87  Pulse: 64 62 (!) 57 64  Resp: 17 16 16 17   Temp: 98.3 F (36.8 C) 98.5 F (36.9 C)  98 F (36.7 C)  TempSrc: Oral Oral  Oral  SpO2: 100% 95% 100% 100%  Weight:      Height:          Data Reviewed:  Basic Metabolic Panel: Recent Labs  Lab 09/21/22 1633 09/21/22 1635 09/22/22 0536 09/22/22 0857 09/23/22 0109 09/24/22 0424 09/25/22 0412  NA  --  136 136  --  136 138 135  K  --  3.1* 3.4* 4.0 3.2* 3.4* 4.4  CL  --  103 104  --  100 103 104  CO2  --  19* 23  --  21* 26 23  GLUCOSE  --  150* 155*  --  101* 103* 114*  BUN  --  15 13  --  16 11 12   CREATININE  --  0.92 0.91  --  0.99 0.94 0.93  CALCIUM  --  7.8* 7.9*  --  7.9* 7.8* 7.5*  MG 1.5*  --   --    --   --   --   --     CBC: Recent Labs  Lab 09/21/22 1635 09/21/22 2217 09/22/22 0536 09/22/22 0857 09/24/22 0424 09/24/22 1121 09/25/22 0412  WBC 5.9  --  5.2  --  5.8 13.9* 5.1  HGB 7.6*   < > 8.0* 8.4* 8.1* 6.6* 7.3*  HCT 24.7*   < > 26.1* 26.8* 25.6* 21.3* 22.4*  MCV 83.7  --  83.7  --  85.6 87.7 85.8  PLT 308  --  319  --  383 370 357   < > = values in this interval not displayed.    LFT Recent Labs  Lab 09/21/22 0326 09/24/22 0424  AST 13* 12*  ALT 15 13  ALKPHOS 61 55  BILITOT 0.3 0.3  PROT 5.1* 4.9*  ALBUMIN 1.9* 1.8*     Antibiotics: Anti-infectives (From admission, onward)    Start  Dose/Rate Route Frequency Ordered Stop   09/21/22 2030  Ampicillin-Sulbactam (UNASYN) 3 g in sodium chloride 0.9 % 100 mL IVPB        3 g 200 mL/hr over 30 Minutes Intravenous Every 6 hours 09/21/22 2029     09/21/22 0600  Ampicillin-Sulbactam (UNASYN) 3 g in sodium chloride 0.9 % 100 mL IVPB        3 g 200 mL/hr over 30 Minutes Intravenous  Once 09/21/22 0553 09/21/22 0734        DVT prophylaxis: SCDs  Code Status: Full code  Family Communication:    CONSULTS gastroenterology   Subjective   Continues to have bloody bowel movements.  Objective    Physical Examination:  General-appears in no acute distress Heart-S1-S2, regular, no murmur auscultated Lungs-clear to auscultation bilaterally, no wheezing or crackles auscultated Abdomen-soft, nontender, no organomegaly Extremities-no edema in the lower extremities Neuro-alert, oriented x3, no focal deficit noted  Status is: Inpatient:      Meredeth Ide   [facility] If 7PM-7AM, please contact night-coverage at [email or web address], Office  [phone]   09/25/2022, 10:40 AM  LOS: 3 days

## 2022-09-25 NOTE — Care Management Important Message (Signed)
Important Message  Patient Details  Name: [name] MRN: [id number] Date of Birth: 11-21-1960   Medicare Important Message Given:  Yes     Sherilyn Banker 09/25/2022, 2:10 PM

## 2022-09-25 NOTE — Progress Notes (Signed)
Got a call from [facility], spoke to the hospitalist Dr. Sandy Salaam, who is accepted the patient for transfer to [facility].  Awaiting bed at [facility]

## 2022-09-25 NOTE — Plan of Care (Signed)
  Problem: Nutritional: Goal: Maintenance of adequate nutrition will improve Outcome: Progressing   Problem: Skin Integrity: Goal: Risk for impaired skin integrity will decrease Outcome: Progressing   Problem: Clinical Measurements: Goal: Will remain free from infection Outcome: Progressing

## 2022-09-25 NOTE — Progress Notes (Signed)
Home CPAP settings = Auto CPAP 5-12 unavailable w/hospital units. Set @ average 8.5 cmH20 since Sleep Study details unavailable. Pt places self on/off   09/25/22 0010  BiPAP/CPAP/SIPAP  $ Non-Invasive Home Ventilator  Subsequent  BiPAP/CPAP/SIPAP Pt Type Adult  BiPAP/CPAP/SIPAP DREAMSTATIOND  Mask Type Full face mask  Mask Size Medium  EPAP 8.5 cmH2O  FiO2 (%) 21 %

## 2022-09-25 NOTE — Plan of Care (Signed)

## 2022-09-25 NOTE — Progress Notes (Signed)
[facility] Progress Note  CC:  Diarrhea, colitis  Subjective: Patient states that he is feeling much better today.  He has not had any more bloody stools.  However he does note that he had about 6 bowel movements this morning already.  He is not having any pain in his abdomen.  He is able to eat without issues.  Objective:  Vital signs in last 24 hours: Temp:  [97.7 F (36.5 C)-98.5 F (36.9 C)] 97.7 F (36.5 C) (08/02 1426) Pulse Rate:  [57-83] 68 (08/02 1426) Resp:  [16-18] 16 (08/02 1426) BP: (89-131)/(56-87) 98/64 (08/02 1426) SpO2:  [95 %-100 %] 100 % (08/02 1426) FiO2 (%):  [21 %] 21 % (08/02 0010) Last BM Date : 09/25/22 General:  Alert, Well-developed, in NAD Heart:  Regular rate Pulm:  No increased WOB Abdomen:  Soft, non-distended.  Non-tender Neurologic:  Alert and oriented x 4;  grossly normal neurologically. Psych:  Alert and cooperative. Normal mood and affect.  Intake/Output from previous day: 08/01 0701 - 08/02 0700 In: 3299.8 [I.V.:2568.4; Blood:630; IV Piggyback:101.4] Out: -   Lab Results: Recent Labs    09/24/22 0424 09/24/22 1121 09/25/22 0412  WBC 5.8 13.9* 5.1  HGB 8.1* 6.6* 7.3*  HCT 25.6* 21.3* 22.4*  PLT 383 370 357   BMET Recent Labs    09/23/22 0109 09/24/22 0424 09/25/22 0412  NA 136 138 135  K 3.2* 3.4* 4.4  CL 100 103 104  CO2 21* 26 23  GLUCOSE 101* 103* 114*  BUN 16 11 12   CREATININE 0.99 0.94 0.93  CALCIUM 7.9* 7.8* 7.5*   LFT Recent Labs    09/24/22 0424  PROT 4.9*  ALBUMIN 1.8*  AST 12*  ALT 13  ALKPHOS 55  BILITOT 0.3   Assessment / Plan: 62 year old male with history of liver transplant in 2020, OSA on CPAP, aortic stenosis s/p aortic valve replacement, CAD, DM, PE, and asthma presented with bloody diarrhea due to colitis presumably related to use of CellCept or IBD.  Flexible sigmoidoscopy was done on 7/30 with biopsies that ruled out CMV.  I discussed his biopsy results with the  pathologist today and she favors that the colitis is most likely due to drug-related colitis from MMF.  She does not think that the colitis is likely to be due to IBD.  I did inquire if the biopsies showed any signs of PTLD, and the pathologist said that she did not see any features on histology that showed PTLD.  However, she will run CD3/CD20 markers to rule out PTLD more definitively.  I did confirm with the patient that he had a negative C. difficile stool study that was did run at [facility] on 09/14/2022.    Patient feels better but is still having diarrhea, and his CRP did go up slightly today compared to yesterday. I contacted the [facility] transplant hepatologist on-call Tawni Carnes and after discussing with the [facility] IBD specialists he recommended Remicade as the next step and suggested that the patient be transferred to [facility] due to the complexity of his case.  I discussed this with the hospitalist and we have initiated transfer to [facility].  However it is not clear when the patient will be able to get a [facility] bed at this time.  I discussed all of this with the patient and he is agreeable to the plan.  If the patient remains here by tomorrow without significant clinical improvement, then will plan to give Remicade 10 mg/kg  here.  In the meantime we will continue IV steroids.  Will continue to trend CRPs.   -Trend CRP -Continue IV steroids -Transfer to [facility] has been initiated -If patient is still here by tomorrow, will plan to give IV Remicade 10 mg/kg   LOS: 3 days   Imogene Burn  09/25/2022, 3:11 PM

## 2022-09-26 DIAGNOSIS — Z6822 Body mass index (BMI) 22.0-22.9, adult: Secondary | ICD-10-CM | POA: Diagnosis not present

## 2022-09-26 DIAGNOSIS — K7469 Other cirrhosis of liver: Secondary | ICD-10-CM | POA: Diagnosis not present

## 2022-09-26 DIAGNOSIS — I10 Essential (primary) hypertension: Secondary | ICD-10-CM | POA: Diagnosis not present

## 2022-09-26 DIAGNOSIS — F329 Major depressive disorder, single episode, unspecified: Secondary | ICD-10-CM | POA: Diagnosis not present

## 2022-09-26 DIAGNOSIS — Z951 Presence of aortocoronary bypass graft: Secondary | ICD-10-CM | POA: Diagnosis not present

## 2022-09-26 DIAGNOSIS — Z7952 Long term (current) use of systemic steroids: Secondary | ICD-10-CM | POA: Diagnosis not present

## 2022-09-26 DIAGNOSIS — Z79890 Hormone replacement therapy (postmenopausal): Secondary | ICD-10-CM | POA: Diagnosis not present

## 2022-09-26 DIAGNOSIS — K523 Indeterminate colitis: Secondary | ICD-10-CM | POA: Diagnosis not present

## 2022-09-26 DIAGNOSIS — Z79620 Long term (current) use of immunosuppressive biologic: Secondary | ICD-10-CM | POA: Diagnosis not present

## 2022-09-26 DIAGNOSIS — Z944 Liver transplant status: Secondary | ICD-10-CM | POA: Diagnosis not present

## 2022-09-26 DIAGNOSIS — G4733 Obstructive sleep apnea (adult) (pediatric): Secondary | ICD-10-CM | POA: Diagnosis not present

## 2022-09-26 DIAGNOSIS — K521 Toxic gastroenteritis and colitis: Secondary | ICD-10-CM | POA: Diagnosis not present

## 2022-09-26 DIAGNOSIS — Z7984 Long term (current) use of oral hypoglycemic drugs: Secondary | ICD-10-CM | POA: Diagnosis not present

## 2022-09-26 DIAGNOSIS — R001 Bradycardia, unspecified: Secondary | ICD-10-CM | POA: Diagnosis not present

## 2022-09-26 DIAGNOSIS — D62 Acute posthemorrhagic anemia: Secondary | ICD-10-CM | POA: Diagnosis not present

## 2022-09-26 DIAGNOSIS — Z9989 Dependence on other enabling machines and devices: Secondary | ICD-10-CM | POA: Diagnosis not present

## 2022-09-26 DIAGNOSIS — Z7982 Long term (current) use of aspirin: Secondary | ICD-10-CM | POA: Diagnosis not present

## 2022-09-26 DIAGNOSIS — K644 Residual hemorrhoidal skin tags: Secondary | ICD-10-CM | POA: Diagnosis not present

## 2022-09-26 DIAGNOSIS — K529 Noninfective gastroenteritis and colitis, unspecified: Secondary | ICD-10-CM | POA: Diagnosis not present

## 2022-09-26 DIAGNOSIS — K7460 Unspecified cirrhosis of liver: Secondary | ICD-10-CM | POA: Diagnosis not present

## 2022-09-26 DIAGNOSIS — K921 Melena: Secondary | ICD-10-CM | POA: Diagnosis not present

## 2022-09-26 DIAGNOSIS — E440 Moderate protein-calorie malnutrition: Secondary | ICD-10-CM | POA: Diagnosis not present

## 2022-09-26 DIAGNOSIS — E785 Hyperlipidemia, unspecified: Secondary | ICD-10-CM | POA: Diagnosis not present

## 2022-09-26 DIAGNOSIS — E875 Hyperkalemia: Secondary | ICD-10-CM | POA: Diagnosis not present

## 2022-09-26 DIAGNOSIS — K648 Other hemorrhoids: Secondary | ICD-10-CM | POA: Diagnosis not present

## 2022-09-26 DIAGNOSIS — E039 Hypothyroidism, unspecified: Secondary | ICD-10-CM | POA: Diagnosis not present

## 2022-09-26 DIAGNOSIS — J45909 Unspecified asthma, uncomplicated: Secondary | ICD-10-CM | POA: Diagnosis not present

## 2022-09-26 DIAGNOSIS — E119 Type 2 diabetes mellitus without complications: Secondary | ICD-10-CM | POA: Diagnosis not present

## 2022-09-26 DIAGNOSIS — I2510 Atherosclerotic heart disease of native coronary artery without angina pectoris: Secondary | ICD-10-CM | POA: Diagnosis not present

## 2022-09-26 DIAGNOSIS — E876 Hypokalemia: Secondary | ICD-10-CM | POA: Diagnosis not present

## 2022-09-26 LAB — GLUCOSE, CAPILLARY
Glucose-Capillary: 86 mg/dL (ref 70–99)
Glucose-Capillary: 103 mg/dL — ABNORMAL HIGH (ref 70–99)
Glucose-Capillary: 161 mg/dL — ABNORMAL HIGH (ref 70–99)
Glucose-Capillary: 288 mg/dL — ABNORMAL HIGH (ref 70–99)
Glucose-Capillary: 292 mg/dL — ABNORMAL HIGH (ref 70–99)

## 2022-09-26 LAB — HEMOGLOBIN AND HEMATOCRIT, BLOOD
HCT: 28.5 % — ABNORMAL LOW (ref 39.0–52.0)
Hemoglobin: 9.1 g/dL — ABNORMAL LOW (ref 13.0–17.0)

## 2022-09-26 LAB — PREPARE RBC (CROSSMATCH)

## 2022-09-26 MED ORDER — SODIUM CHLORIDE 0.9 % IV SOLN
10.0000 mg/kg | Freq: Once | INTRAVENOUS | Status: AC
  Administered 2022-09-26: 700 mg via INTRAVENOUS
  Filled 2022-09-26: qty 70

## 2022-09-26 MED ORDER — SODIUM CHLORIDE 0.9% IV SOLUTION: Freq: Once | INTRAVENOUS | Status: DC

## 2022-09-26 NOTE — Progress Notes (Addendum)
Report given to accepting nurse Claris Che at [facility]. Patient will be going to room 8328 at [facility]. Transport has been contacted, no ETA at the moment.   2232: Transport here to transfer pt.

## 2022-09-26 NOTE — Progress Notes (Signed)
     [facility] Progress Note  CC:  Diarrhea, colitis  Subjective: Patient is continue to have diarrhea.  Denies any blood in his stools.  He had about 8-10 bowel movements yesterday.  Denies any abdominal pain  Objective:  Vital signs in last 24 hours: Temp:  [97.7 F (36.5 C)-98.5 F (36.9 C)] 98.5 F (36.9 C) (08/03 1229) Pulse Rate:  [62-72] 65 (08/03 1202) Resp:  [15-18] 16 (08/03 1229) BP: (98-138)/(64-91) 111/76 (08/03 1229) SpO2:  [97 %-100 %] 100 % (08/03 1229) Last BM Date : 09/26/22 General:  Alert, Well-developed, in NAD Heart:  Regular rate Pulm:  No increased WOB Abdomen:  Soft, non-distended.  Non-tender Neurologic:  Alert and oriented x 4;  grossly normal neurologically. Psych:  Alert and cooperative. Normal mood and affect.  Intake/Output from previous day: 08/02 0701 - 08/03 0700 In: 1847.5 [P.O.:480; I.V.:1267.5; IV Piggyback:100] Out: -   Lab Results: Recent Labs    09/24/22 1121 09/25/22 0412 09/26/22 0132  WBC 13.9* 5.1 7.2  HGB 6.6* 7.3* 6.6*  HCT 21.3* 22.4* 21.2*  PLT 370 357 345   BMET Recent Labs    09/24/22 0424 09/25/22 0412  NA 138 135  K 3.4* 4.4  CL 103 104  CO2 26 23  GLUCOSE 103* 114*  BUN 11 12  CREATININE 0.94 0.93  CALCIUM 7.8* 7.5*   LFT Recent Labs    09/24/22 0424  PROT 4.9*  ALBUMIN 1.8*  AST 12*  ALT 13  ALKPHOS 55  BILITOT 0.3   Assessment / Plan: 62 year old male with history of liver transplant in 2020, OSA on CPAP, aortic stenosis s/p aortic valve replacement, CAD, DM, PE, and asthma presented with bloody diarrhea due to colitis presumably related to use of CellCept or IBD.  Recent stool studies were negative for infection.  Flexible sigmoidoscopy was done on 7/30 with biopsies that ruled out CMV.  His colon biopsies after discussion with pathology suggested MMF related colitis with ulcerative colitis is a less likely etiology.  Patient's CRP has now down trended again today on IV  steroids.  However he is still having significant amount of bowel movements daily.  Thus we will plan to proceed with IV Remicade therapy today, as discussed with [facility] transplant hepatology.  We are currently awaiting transfer to [facility].  Hopefully patient will have significant benefit from from the IV Remicade in addition to the IV steroids.  Hemoglobin did downtrend slightly today so he is getting a unit of PRBCs today  -Trend CRP -Trend hemoglobin.  Transfuse as needed -Continue IV steroids -Follow-up B-cell markers from pathology -Transfer to [facility] has been initiated -Will give IV Remicade 10 mg/kg today   LOS: 4 days   Imogene Burn  09/26/2022, 12:40 PM

## 2022-09-26 NOTE — Progress Notes (Signed)
Spoke to Dr Tawni Carnes at [facility], they might have bed tonight at [facility]. Dr Sandy Salaam is the accepting physician

## 2022-09-26 NOTE — Progress Notes (Signed)
    Patient Name: JEDADIAH ABDALLAH           DOB: May 24, 1960  MRN: [id number]      Admission Date: 09/21/2022  Attending Provider: Meredeth Ide, MD  Primary Diagnosis: Colitis   Level of care: Telemetry Medical    CROSS COVER NOTE   Date of Service   09/26/2022   NYAL SCHACHTER, 62 y.o. male, was admitted on 09/21/2022 for Colitis.    HPI/Events of Note   Acute Blood Loss Anemia Hemoglobin 7.3 -->  6.6.  No acute changes reported.  Hemodynamically stable. No melena, hematochezia, or other bleeding reported tonight.  transfuse for Hb less than 7    Interventions/ Plan   Blood transfusion, 1 unit PRBC        Anthoney Harada, DNP, [employer] [facility] [place]

## 2022-09-26 NOTE — Unmapped (Signed)
Patient accepted previously by me on 7/29.  Please refer to initial telephone accept note from that date.  I received a page from [facility] yesterday stating that transfer request was being cancelled.    Today, [facility] hepatology reached out to me to discuss patient's current state.  Continues to have bloody bowel movements with new colitis of unclear cause.  Remains hemodynamically stable.  Hepatology would like to start biologics ASAP but not clear that the OSH will feel comfortable doing this.  Hepatology recommends reinitiating transfer request with an attempt to get the patient here ASAP.      Contacted with sending provider who confirmed that the patient remains stable for a floor bed but remains symptomatic despite ongoing course of steroids.

## 2022-09-26 NOTE — Progress Notes (Signed)
[facility]  PROGRESS NOTE  [name] Justin NGE:[id number] DOB: September 08, 1960 DOA: 09/21/2022 PCP: Joaquim Nam, MD   Brief HPI:    62 y.o. 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  Initial plan was to transfer to [facility], however patient decided to stay here and undergo  endoscopy and flexible sigmoidoscopy. Patient began to have diarrhea after start Ozempic in May. Had CT abdomen in June demonstrating moderate severity descending and sigmoid colitis. He was treated with multiple course of antibiotics without improvement. Had several C.diff test that was negative. Negative stool pathogen test. Negative EBV, CMV, and HSV.  Subsequently underwent colonoscopy on 09/09/2022 with severe mucosal changes characterized by edema, erythema, friability, deep ulcerations and shallow ulcerations throughout the entire colon. Pathology from biopsy revealed CMV immunostain shows only rare positive cells in lamina propria. No conspicuous evidence of viropathic change is seen on H&E stain. HSV1/2 immunostain is negative. Patient was then started on prolonged steroid taper and prophylaxic Bactrim. He has some improvement of his diarrhea and energy level on steroids. However yesterday had bloody diarrhea so decided to present to ED. Has mostly pain to rectal region due to hemorroids. Also has lower abdominal tenderness. He is on Cellcept and Prograf for his liver transplant but was told by [facility] to discontinue Cellcept 2 days ago due to concerns for Cellcept induced colitis.   In the ED CTA abdomen showed widespread colitis but no acute GI extravasation.  He was started on IV Unasyn and was seen in consultation by [facility] who recommended to start IV steroids  with plan for flexible sigmoidoscopy and endoscopy today.  He was also transfused 2 units PRBC.   Assessment/Plan:   Recurrent colitis -In the setting of immunosuppressant therapy for liver transplant -Failed outpatient treatment with p.o. antibiotics and steroids -CT imaging shows worsening colitis as compared to June CT scan -Initial plan was to transfer to [facility] however patient decided to stay at [facility] consulted, patient underwent EGD and colonoscopy -EGD showed mild gastritis/duodenitis and colonoscopy showed diffuse colitis, biopsy obtained -Continue pantoprazole 40 mg daily -Continue Solu-Medrol 40 mg IV daily -Continues to have bloody bowel movements - pathology from the biopsy showed moderately active colitis, negative for CMV.  Gastric and duodenal biopsies show inflammation but negative for H. pylori infection -Discussed with gastroenterology, she has called up and discussed with transplant hepatologist, Dr. Dorris Carnes removed at [facility].  He recommends patient to be transferred to [facility] for further management. -Follow CRP, continue IV steroids -Transfer to [facility] has been initiated -GI plans to start Remicade today  Syncope -Patient had vasovagal syncope while sitting on commode in the bathroom -Blood pressure dropped to 78/61, improved with normal saline bolus -Continue LR at 100 ml/h  Acute blood loss anemia -Hemoglobin was 6.6 this morning -1 unit PRBC ordered -He already received 2 units PRBC previously -Follow CBC in a.m. and transfuse for Hb less than 7  S/p AVR -AVR with Inspiriris Resilia pericardial valve on 04/03/21  Looks to be on full dose ASA  Hold for now pending evaluation for recurrent colitis with acute blood loss anemia  Coronary artery disease involving native coronary artery of native heart without angina pectoris Pt s/p CABG x 1 with LIMA to LAD for 80% stenosis  On  high dose ASA and statin  Holding statin for now in setting of colitis and bleeding   HLD  (hyperlipidemia) Resume statin as colitis improves     Liver transplant recipient [facility]) S/p liver transplant 08/2018 for crytogenic cirrhosis  -LFTs within normal limits On cellcept and prograf - Cellcept was discontinued on 7/27 by [facility] due to concern for MMF-induced colitis Continue Prograf     Diabetes mellitus without complication (HCC) SSI  -CBG well-controlled   OSA (obstructive sleep apnea) Cpap   Asthma Stable from a resp standpoint   Essential hypertension -Blood pressure is soft -Metoprolol on hold   Hypothyroidism -continue Synthroid   Hypokalemia -Replete  Medications     sodium chloride   Intravenous Once   insulin aspart  0-9 Units Subcutaneous Q4H   levothyroxine  175 mcg Oral Q0600   methylPREDNISolone (SOLU-MEDROL) injection  40 mg Intravenous Daily   pantoprazole  40 mg Oral Daily   pravastatin  40 mg Oral QPM   sertraline  50 mg Oral Daily   tacrolimus  2 mg Oral BID     Data Reviewed:   CBG:  Recent Labs  Lab 09/25/22 1645 09/25/22 1947 09/25/22 2304 09/26/22 0406 09/26/22 0849  GLUCAP 234* 218* 190* 86 103*    SpO2: 100 % FiO2 (%): 21 %    Vitals:   09/25/22 1426 09/25/22 1946 09/26/22 0244 09/26/22 0846  BP: 98/64 99/66 (!) 138/91 125/85  Pulse: 68 72  62  Resp: 16 18  15   Temp: 97.7 F (36.5 C) 97.9 F (36.6 C)  98.3 F (36.8 C)  TempSrc: Oral   Oral  SpO2: 100% 97%  100%  Weight:      Height:          Data Reviewed:  Basic Metabolic Panel: Recent Labs  Lab 09/21/22 1633 09/21/22 1635 09/22/22 0536 09/22/22 0857 09/23/22 0109 09/24/22 0424 09/25/22 0412  NA  --  136 136  --  136 138 135  K  --  3.1* 3.4* 4.0 3.2* 3.4* 4.4  CL  --  103 104  --  100 103 104  CO2  --  19* 23  --  21* 26 23  GLUCOSE  --  150* 155*  --  101* 103* 114*  BUN  --  15 13  --  16 11 12   CREATININE  --  0.92 0.91  --  0.99 0.94 0.93  CALCIUM  --  7.8* 7.9*  --  7.9* 7.8* 7.5*  MG 1.5*  --   --   --   --   --   --      CBC: Recent Labs  Lab 09/22/22 0536 09/22/22 0857 09/24/22 0424 09/24/22 1121 09/25/22 0412 09/26/22 0132  WBC 5.2  --  5.8 13.9* 5.1 7.2  HGB 8.0* 8.4* 8.1* 6.6* 7.3* 6.6*  HCT 26.1* 26.8* 25.6* 21.3* 22.4* 21.2*  MCV 83.7  --  85.6 87.7 85.8 87.6  PLT 319  --  383 370 357 345    LFT Recent Labs  Lab 09/21/22 0326 09/24/22 0424  AST 13* 12*  ALT 15 13  ALKPHOS 61 55  BILITOT 0.3 0.3  PROT 5.1* 4.9*  ALBUMIN 1.9* 1.8*     Antibiotics: Anti-infectives (From admission, onward)    Start     Dose/Rate Route Frequency Ordered Stop   09/21/22 2030  Ampicillin-Sulbactam (UNASYN) 3 g in sodium chloride 0.9 % 100 mL IVPB        3  g 200 mL/hr over 30 Minutes Intravenous Every 6 hours 09/21/22 2029     09/21/22 0600  Ampicillin-Sulbactam (UNASYN) 3 g in sodium chloride 0.9 % 100 mL IVPB        3 g 200 mL/hr over 30 Minutes Intravenous  Once 09/21/22 0553 09/21/22 0734        DVT prophylaxis: SCDs  Code Status: Full code  Family Communication:    CONSULTS gastroenterology   Subjective   Denies blood in the stools but still having diarrhea.  Had 3 small BMs today.  Objective    Physical Examination:  General-appears in no acute distress Heart-S1-S2, regular, no murmur auscultated Lungs-clear to auscultation bilaterally, no wheezing or crackles auscultated Abdomen-soft, nontender, no organomegaly Extremities-no edema in the lower extremities Neuro-alert, oriented x3, no focal deficit noted  Status is: Inpatient:      Meredeth Ide   [facility] If 7PM-7AM, please contact night-coverage at [email or web address], Office  [phone]   09/26/2022, 10:31 AM  LOS: 4 days

## 2022-09-26 NOTE — Progress Notes (Signed)
CRITICAL VALUE: 6.6 hemoglobin   DATE & TIME NOTIFIED: 09/26/22 0227  MD NOTIFIED: Liana Crocker  TIME OF NOTIFICATION: [phone]

## 2022-09-27 ENCOUNTER — Encounter
Admit: 2022-09-27 | Discharge: 2022-10-03 | Disposition: A | Discharge status: Home / Self Care | Payer: MEDICARE | Source: Other Acute Inpatient Hospital | Admitting: Student in an Organized Health Care Education/Training Program

## 2022-09-27 ENCOUNTER — Ambulatory Visit
Admit: 2022-09-27 | Discharge: 2022-10-03 | Disposition: A | Discharge status: Home / Self Care | Payer: MEDICARE | Source: Other Acute Inpatient Hospital | Admitting: Student in an Organized Health Care Education/Training Program

## 2022-09-27 ENCOUNTER — Encounter
Admit: 2022-09-27 | Discharge: 2022-10-03 | Disposition: A | Discharge status: Home / Self Care | Payer: MEDICARE | Source: Other Acute Inpatient Hospital | Attending: Student in an Organized Health Care Education/Training Program | Admitting: Student in an Organized Health Care Education/Training Program

## 2022-09-27 ENCOUNTER — Encounter: Admit: 2022-09-27 | Discharge: 2022-10-03 | Payer: MEDICARE

## 2022-09-27 LAB — COMPREHENSIVE METABOLIC PANEL
ALBUMIN: 2.2 g/dL — ABNORMAL LOW (ref 3.4–5.0)
ALKALINE PHOSPHATASE: 78 U/L (ref 46–116)
ALT (SGPT): 26 U/L (ref 10–49)
ANION GAP: 3 mmol/L — ABNORMAL LOW (ref 5–14)
AST (SGOT): 41 U/L — ABNORMAL HIGH (ref <=34)
BILIRUBIN TOTAL: 0.3 mg/dL (ref 0.3–1.2)
BLOOD UREA NITROGEN: 11 mg/dL (ref 9–23)
BUN / CREAT RATIO: 13
CALCIUM: 7.8 mg/dL — ABNORMAL LOW (ref 8.7–10.4)
CHLORIDE: 106 mmol/L (ref 98–107)
CO2: 30 mmol/L (ref 20.0–31.0)
CREATININE: 0.86 mg/dL
EGFR CKD-EPI (2021) MALE: >90 mL/min/{1.73_m2} (ref >=60)
GLUCOSE RANDOM: 125 mg/dL (ref 70–179)
POTASSIUM: 3.2 mmol/L — ABNORMAL LOW (ref 3.4–4.8)
PROTEIN TOTAL: 5.1 g/dL — ABNORMAL LOW (ref 5.7–8.2)
SODIUM: 139 mmol/L (ref 135–145)

## 2022-09-27 LAB — CBC
HEMATOCRIT: 27.6 % — ABNORMAL LOW (ref 39.0–48.0)
HEMOGLOBIN: 9.4 g/dL — ABNORMAL LOW (ref 12.9–16.5)
MEAN CORPUSCULAR HEMOGLOBIN CONC: 34 g/dL (ref 32.0–36.0)
MEAN CORPUSCULAR HEMOGLOBIN: 28.4 pg (ref 25.9–32.4)
MEAN CORPUSCULAR VOLUME: 83.5 fL (ref 77.6–95.7)
MEAN PLATELET VOLUME: 6.3 fL — ABNORMAL LOW (ref 6.8–10.7)
PLATELET COUNT: 398 10*9/L (ref 150–450)
RED BLOOD CELL COUNT: 3.31 10*12/L — ABNORMAL LOW (ref 4.26–5.60)
RED CELL DISTRIBUTION WIDTH: 16.8 % — ABNORMAL HIGH (ref 12.2–15.2)
WBC ADJUSTED: 9.8 10*9/L (ref 3.6–11.2)

## 2022-09-27 LAB — PHOSPHORUS: PHOSPHORUS: 2.6 mg/dL (ref 2.4–5.1)

## 2022-09-27 LAB — C-REACTIVE PROTEIN: C-REACTIVE PROTEIN: 27 mg/L — ABNORMAL HIGH (ref <=10.0)

## 2022-09-27 LAB — PROTIME-INR
INR: 1.08
PROTIME: 12.1 s (ref 9.9–12.6)

## 2022-09-27 LAB — VITAMIN B12: VITAMIN B-12: 877 pg/mL (ref 211–911)

## 2022-09-27 LAB — MAGNESIUM: MAGNESIUM: 1.4 mg/dL — ABNORMAL LOW (ref 1.6–2.6)

## 2022-09-27 LAB — IRON PANEL
IRON SATURATION: 13 % — ABNORMAL LOW (ref 20–55)
IRON: 24 ug/dL — ABNORMAL LOW
TOTAL IRON BINDING CAPACITY: 188 ug/dL — ABNORMAL LOW (ref 250–425)

## 2022-09-27 LAB — FERRITIN: FERRITIN: 192.1 ng/mL

## 2022-09-27 LAB — BPAM RBC
Blood Product Expiration Date: 202408282359
ISSUE DATE / TIME: 202408031201
Unit Type and Rh: 8400

## 2022-09-27 LAB — TYPE AND SCREEN
ABO/RH(D): AB POS
Antibody Screen: NEGATIVE
Unit division: 0

## 2022-09-27 MED ADMIN — lactated Ringers infusion: 100 mL/h | INTRAVENOUS | @ 10:00:00 | Stop: 2022-09-27

## 2022-09-27 MED ADMIN — lactated Ringers infusion: 100 mL/h | INTRAVENOUS | @ 17:00:00 | Stop: 2022-09-27

## 2022-09-27 MED ADMIN — sertraline (ZOLOFT) tablet 50 mg: 50 mg | ORAL | @ 13:00:00

## 2022-09-27 MED ADMIN — potassium chloride ER tablet 40 mEq: 40 meq | ORAL | @ 19:00:00 | Stop: 2022-09-27

## 2022-09-27 MED ADMIN — magnesium sulfate in water 2 gram/50 mL (4 %) IVPB 2 g: 2 g | INTRAVENOUS | @ 19:00:00 | Stop: 2022-09-27

## 2022-09-27 MED ADMIN — lactated Ringers infusion: 100 mL/h | INTRAVENOUS | @ 06:00:00 | Stop: 2022-09-27

## 2022-09-27 MED ADMIN — metoPROLOL tartrate (Lopressor) tablet 25 mg: 25 mg | ORAL | @ 13:00:00

## 2022-09-27 MED ADMIN — tacrolimus (PROGRAF) capsule 2 mg: 2 mg | ORAL | @ 13:00:00

## 2022-09-27 MED ADMIN — pravastatin (PRAVACHOL) tablet 40 mg: 40 mg | ORAL | @ 13:00:00

## 2022-09-27 MED ADMIN — empagliflozin (JARDIANCE) tablet 10 mg: 10 mg | ORAL | @ 13:00:00

## 2022-09-27 MED ADMIN — methylPREDNISolone sodium succinate (PF) (SOLU-Medrol) injection 20 mg: 20 mg | INTRAVENOUS | @ 18:00:00

## 2022-09-27 MED ADMIN — levothyroxine (SYNTHROID) tablet 175 mcg: 175 ug | ORAL | @ 13:00:00

## 2022-09-27 MED ADMIN — insulin lispro (HumaLOG) injection 0-20 Units: 0-20 [IU] | SUBCUTANEOUS | @ 22:00:00

## 2022-09-27 MED ADMIN — insulin lispro (HumaLOG) injection 0-20 Units: 0-20 [IU] | SUBCUTANEOUS | @ 17:00:00

## 2022-09-27 MED ADMIN — sulfamethoxazole-trimethoprim (BACTRIM DS) 800-160 mg tablet 160 mg of trimethoprim: 1 | ORAL | @ 13:00:00 | Stop: 2022-10-18

## 2022-09-27 NOTE — Discharge Summary (Signed)
Physician Discharge Summary   Patient: [name] MRN: [id number] DOB: May 01, 1960  Admit date:     09/21/2022  Discharge date: 09/26/2022  Discharge Physician: Meredeth Ide   PCP: Joaquim Nam, MD   Recommendations at discharge:   Patient be transferred to [facility]  Discharge Diagnoses: Principal Problem:   Colitis Active Problems:   Acute blood loss anemia   Hypothyroidism   Essential hypertension   OSA (obstructive sleep apnea)   Diabetes mellitus without complication (HCC)   Liver transplant recipient (HCC)   HLD (hyperlipidemia)   Coronary artery disease involving native coronary artery of native heart without angina pectoris   S/P AVR (aortic valve replacement)   Lower GI bleed  Resolved Problems:   * No resolved hospital problems. *  Hospital Course:  62 y.o. 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  Initial plan was to transfer to [facility], however patient decided to stay here and undergo  endoscopy and flexible sigmoidoscopy. Patient began to have diarrhea after start Ozempic in May. Had CT abdomen in June demonstrating moderate severity descending and sigmoid colitis. He was treated with multiple course of antibiotics without improvement. Had several C.diff test that was negative. Negative stool pathogen test. Negative EBV, CMV, and HSV.  Subsequently underwent colonoscopy on 09/09/2022 with severe mucosal changes characterized by edema, erythema, friability, deep ulcerations and shallow ulcerations throughout the entire colon. Pathology from biopsy revealed CMV immunostain shows only rare positive cells in lamina propria. No conspicuous evidence of viropathic change is seen on H&E stain. HSV1/2  immunostain is negative. Patient was then started on prolonged steroid taper and prophylaxic Bactrim. He has some improvement of his diarrhea and energy level on steroids. However yesterday had bloody diarrhea so decided to present to ED. Has mostly pain to rectal region due to hemorroids. Also has lower abdominal tenderness. He is on Cellcept and Prograf for his liver transplant but was told by [facility] to discontinue Cellcept 2 days ago due to concerns for Cellcept induced colitis.    In the ED CTA abdomen showed widespread colitis but no acute GI extravasation.  He was started on IV Unasyn and was seen in consultation by [facility] who recommended to start IV steroids with plan for flexible sigmoidoscopy and endoscopy today.  He was also transfused 2 units PRBC.  Assessment and Plan:    Recurrent colitis -In the setting of immunosuppressant therapy for liver transplant -Failed outpatient treatment with p.o. antibiotics and steroids -CT imaging shows worsening colitis as compared to June CT scan -Initial plan was to transfer to [facility] however patient decided to stay at [facility] consulted, patient underwent EGD and colonoscopy -EGD showed mild gastritis/duodenitis and colonoscopy showed diffuse colitis, biopsy obtained -Continue pantoprazole 40 mg daily -Continue Solu-Medrol 40 mg IV daily -Continues to have bloody bowel movements - pathology from the biopsy showed moderately active colitis, negative for CMV.  Gastric and duodenal biopsies show inflammation but negative for H. pylori infection -Discussed with gastroenterology, she has called up and discussed with transplant hepatologist, Dr. Dorris Carnes removed at [facility].  He recommends patient to be transferred to [facility] for further management. -Follow CRP, continue IV steroids -1 dose of Remicade 10 mg/kg given per GI -Transfer to [facility] for further management per  hepatology    Syncope -Patient had vasovagal syncope while sitting on commode in the  bathroom -Blood pressure dropped to 78/61, improved with normal saline bolus -Started on LR at 100 mL/h  Acute blood loss anemia -Hemoglobin was 6.6 this morning -1 unit PRBC ordered -He already received 2 units PRBC previously -Repeat hemoglobin today 9.1   S/p AVR -AVR with Inspiriris Resilia pericardial valve on 04/03/21  Looks to be on full dose ASA  Hold for now pending evaluation for recurrent colitis with acute blood loss anemia   Coronary artery disease involving native coronary artery of native heart without angina pectoris Pt s/p CABG x 1 with LIMA to LAD for 80% stenosis  On high dose ASA and statin  Holding statin for now in setting of colitis and bleeding   HLD (hyperlipidemia) Resume statin as colitis improves     Liver transplant recipient [facility]) S/p liver transplant 08/2018 for crytogenic cirrhosis  -LFTs within normal limits On cellcept and prograf - Cellcept was discontinued on 7/27 by [facility] due to concern for MMF-induced colitis Continue Prograf     Diabetes mellitus without complication (HCC) SSI  -CBG well-controlled   OSA (obstructive sleep apnea) Cpap   Asthma Stable from a resp standpoint    Essential hypertension -Blood pressure is soft -Metoprolol on hold   Hypothyroidism -continue Synthroid    Hypokalemia -Replete      Consultants: Gastroenterology Procedures performed:  Disposition: Transfer to [facility] Diet recommendation: Regular diet Discharge Diet Orders (From admission, onward)     Start     Ordered   09/27/22 0000  Diet - low sodium heart healthy        09/27/22 0622           Regular diet DISCHARGE MEDICATION: Allergies as of 09/26/2022       Reactions   Watermelon Flavor Itching   Mouth itching        Medication List     TAKE these medications    acetaminophen 500 MG tablet Commonly known as: TYLENOL Take 500 mg by mouth every 6 (six) hours as needed for moderate pain or headache.   albuterol  108 (90 Base) MCG/ACT inhaler Commonly known as: VENTOLIN HFA Inhale 2 puffs into the lungs every 6 (six) hours as needed for wheezing or shortness of breath.   alendronate 70 MG tablet Commonly known as: FOSAMAX Take 1 tablet (70 mg total) by mouth every Sunday. Take with a full glass of water on an empty stomach.   aspirin EC 325 MG tablet Take 1 tablet (325 mg total) by mouth daily.   azelastine 0.1 % nasal spray Commonly known as: ASTELIN PLACE 2 SPRAYS INTO BOTH NOSTRILS TWICE DAILY AS NEEDED   calcium carbonate 500 MG chewable tablet Commonly known as: TUMS - dosed in mg elemental calcium Chew 1 tablet by mouth daily.   empagliflozin 10 MG Tabs tablet Commonly known as: JARDIANCE Take 1 tablet (10 mg total) by mouth daily before breakfast.   fluticasone 50 MCG/ACT nasal spray Commonly known as: FLONASE PLACE ONE OR TWO SPRAYS INTO BOTH NOSTRILS DAILY AS NEEDED.   GaviLyte-G 236 g solution Generic drug: polyethylene glycol Take 4,000 mLs by mouth once.   hydrocortisone cream 1 % Apply to affected area 2 times daily   levothyroxine 175 MCG tablet Commonly known as: SYNTHROID Take 1 tablet (175 mcg total) by mouth daily before breakfast.   metFORMIN 500 MG 24 hr tablet Commonly known as: GLUCOPHAGE-XR Take  2 tablets (1,000 mg total) by mouth every morning AND 2 tablets (1,000 mg total) at bedtime.   metoprolol tartrate 25 MG tablet Commonly known as: LOPRESSOR Take 1 tablet (25 mg total) by mouth two (2) times a day.   metroNIDAZOLE 1 % gel Commonly known as: METROGEL Apply 1 application topically daily as needed (rosacea).   mycophenolate 250 MG capsule Commonly known as: CELLCEPT Take 250 mg by mouth 2 (two) times daily.   NON FORMULARY Pt uses a cpap nightly   ondansetron 4 MG tablet Commonly known as: ZOFRAN Take 1 tablet (4 mg total) by mouth every 6 (six) hours.   OneTouch Delica Lancets 33G Misc USE TO CHECK SUGAR DAILY   OneTouch Ultra test  strip Generic drug: glucose blood USE TO CHECK SUGAR DAILY   potassium chloride SA 20 MEQ tablet Commonly known as: KLOR-CON M Take 1 tablet by mouth daily.   pravastatin 40 MG tablet Commonly known as: PRAVACHOL Take 40 mg by mouth every evening.   predniSONE 10 MG tablet Commonly known as: DELTASONE Take 5-40 mg by mouth See admin instructions. Take 4 tablets (40 mg total) by mouth daily for 14 days, THEN 3 tablets (30 mg total) daily for 7 days, THEN 2 tablets (20 mg total) daily for 7 days, THEN 1 tablet (10 mg total) daily for 7 days, THEN 0.5 tablets (5 mg total) daily for 7 days.   sertraline 50 MG tablet Commonly known as: ZOLOFT Take 1 tablet (50 mg total) by mouth daily.   sulfamethoxazole-trimethoprim 400-80 MG tablet Commonly known as: BACTRIM Take 1 tablet by mouth every Monday, Wednesday, and Friday.   tacrolimus 1 MG capsule Commonly known as: PROGRAF Take 2 mg by mouth 2 (two) times daily.   Vitamin D 50 MCG (2000 UT) tablet Take 2,000 Units by mouth daily.        Follow-up Information     Joaquim Nam, MD Follow up.   Specialty: Family Medicine Contact information: [address] [place] [address] [phone]                Discharge Exam: Ceasar Mons Weights   09/21/22 0322  Weight: 69.9 kg   General-appears in no acute distress Heart-S1-S2, regular, no murmur auscultated Lungs-clear to auscultation bilaterally, no wheezing or crackles auscultated Abdomen-soft, nontender, no organomegaly Extremities-no edema in the lower extremities Neuro-alert, oriented x3, no focal deficit noted  Condition at discharge: good  The results of significant diagnostics from this hospitalization (including imaging, microbiology, ancillary and laboratory) are listed below for reference.   Imaging Studies: CT ANGIO GI BLEED  Result Date: 09/21/2022 CLINICAL DATA:  62 year old male with several days of bloody bowel movements. Bright red blood  per rectum today. History of colitis. EXAM: CTA ABDOMEN AND PELVIS WITHOUT AND WITH CONTRAST TECHNIQUE: Multidetector CT imaging of the abdomen and pelvis was performed using the standard protocol during bolus administration of intravenous contrast. Multiplanar reconstructed images and MIPs were obtained and reviewed to evaluate the vascular anatomy. RADIATION DOSE REDUCTION: This exam was performed according to the departmental dose-optimization program which includes automated exposure control, adjustment of the mA and/or kV according to patient size and/or use of iterative reconstruction technique. CONTRAST:  OMNIPAQUE IOHEXOL 350 MG/ML SOLN COMPARISON:  CT Abdomen and Pelvis 08/16/2022 and earlier. FINDINGS: VASCULAR Normal caliber abdominal aorta. Minimal Aortoiliac calcified atherosclerosis. Negative for abdominal aortic aneurysm or dissection. Major arterial branches including the celiac, SMA and IMA are patent with no  significant branch atherosclerosis identified. Iliac and proximal femoral arteries are patent with minimal to mild for age atherosclerosis. And the portal venous system is patent on the final phase series 8. Right lower quadrant staple line associated with chronic neo terminal ileum. Hyperenhancement of thick-walled sigmoid colon on the early arterial phase images (series 6, image 120). But there is no contrast extravasation into the bowel lumen identified. Review of the MIP images confirms the above findings. NON-VASCULAR Lower chest: Prior sternotomy. Partially visible TAVR. Heart size is stable, within normal limits. No pericardial or pleural effusion. Negative lung bases. Hepatobiliary: Chronic cholecystectomy. Liver appears stable, negative. Pancreas: Negative. Spleen: Negative. Adrenals/Urinary Tract: Stable and negative. Stomach/Bowel: Rectum appears within normal limits today. From the splenic flexure through the sigmoid colon the large bowel is decompressed, but also appears  somewhat thick-walled and featureless (left lower quadrant series 8, image 64) similar to the CT last month. Up to mild associated mesenteric stranding in those segments, most apparent on series 5 precontrast images. No diverticula identified. The distal sigmoid colon is gas distended and appears more normal. The transverse colon appears more normal, but the hepatic flexure has a similar featureless and thick-walled appearance (series 8, image 32). Mild if any mesenteric stranding there. There are occasional right colon diverticula. And there is a neo terminal ileum redemonstrated with no adverse features. No dilated small bowel. Decompressed stomach and duodenum. No pneumoperitoneum. Lymphatic: No lymphadenopathy. Reproductive: Negative. Other: There is a small volume of free fluid now in the deep pelvis greater on the left (series 8, image 72) with simple fluid density. This is new from last month. Musculoskeletal: Median sternotomy. Flowing lower thoracic endplate osteophytes resulting and chronic lower thoracic spinal ankylosis. No acute osseous abnormality identified. IMPRESSION: 1. Evidence of ongoing widespread Colitis since the CT last month, most pronounced in the sigmoid colon. New small volume of simple density free fluid in the pelvis, likely reactive. No evidence of bowel perforation or obstruction. Negative for active GI bleeding by CTA. 2. No other acute or inflammatory process identified. Electronically Signed   By: Odessa Fleming M.D.   On: 09/21/2022 05:47    Microbiology: Results for orders placed or performed during the hospital encounter of 09/21/22  Culture, blood (Routine X 2) w Reflex to ID Panel     Status: None   Collection Time: 09/21/22  4:50 AM   Specimen: BLOOD  Result Value Ref Range Status   Specimen Description BLOOD RIGHT ANTECUBITAL  Final   Special Requests   Final    BOTTLES DRAWN AEROBIC AND ANAEROBIC Blood Culture adequate volume   Culture   Final    NO GROWTH 5  DAYS Performed at [facility], [address] [place], [place] [address]    Report Status 09/26/2022 FINAL  Final  Culture, blood (Routine X 2) w Reflex to ID Panel     Status: None   Collection Time: 09/21/22  4:55 AM   Specimen: BLOOD RIGHT HAND  Result Value Ref Range Status   Specimen Description BLOOD RIGHT HAND  Final   Special Requests   Final    BOTTLES DRAWN AEROBIC AND ANAEROBIC Blood Culture results may not be optimal due to an inadequate volume of blood received in culture bottles   Culture   Final    NO GROWTH 5 DAYS Performed at [facility], [address], [place], [place] [address]    Report Status 09/26/2022 FINAL  Final    Labs: CBC:  Recent Labs  Lab 09/22/22 0536 09/22/22 0857 09/24/22 0424 09/24/22 1121 09/25/22 0412 09/26/22 0132 09/26/22 1817  WBC 5.2  --  5.8 13.9* 5.1 7.2  --   HGB 8.0*   < > 8.1* 6.6* 7.3* 6.6* 9.1*  HCT 26.1*   < > 25.6* 21.3* 22.4* 21.2* 28.5*  MCV 83.7  --  85.6 87.7 85.8 87.6  --   PLT 319  --  383 370 357 345  --    < > = values in this interval not displayed.   Basic Metabolic Panel: Recent Labs  Lab 09/21/22 1633 09/21/22 1635 09/22/22 0536 09/22/22 0857 09/23/22 0109 09/24/22 0424 09/25/22 0412  NA  --  136 136  --  136 138 135  K  --  3.1* 3.4* 4.0 3.2* 3.4* 4.4  CL  --  103 104  --  100 103 104  CO2  --  19* 23  --  21* 26 23  GLUCOSE  --  150* 155*  --  101* 103* 114*  BUN  --  15 13  --  16 11 12   CREATININE  --  0.92 0.91  --  0.99 0.94 0.93  CALCIUM  --  7.8* 7.9*  --  7.9* 7.8* 7.5*  MG 1.5*  --   --   --   --   --   --    Liver Function Tests: Recent Labs  Lab 09/21/22 0326 09/24/22 0424  AST 13* 12*  ALT 15 13  ALKPHOS 61 55  BILITOT 0.3 0.3  PROT 5.1* 4.9*  ALBUMIN 1.9* 1.8*   CBG: Recent Labs  Lab 09/26/22 0406 09/26/22 0849 09/26/22 1135 09/26/22 1633 09/26/22 1959  GLUCAP 86 103* 161* 288* 292*    Discharge time spent: greater than 30 minutes.  Signed: Meredeth Ide, MD [facility] 09/27/2022

## 2022-09-27 NOTE — Unmapped (Signed)
Pt admitted to 8 BT via stretcher by [facility] EMT transport. Pt is alert and oriented x4, BP slightly elevated. Full eye skin check completed with a second nurse Priya, RN. Pt skin is clean, dry, and intact. Bruising noted to right AC from iv site. Pt is continent of bowel and bladder, independent and ambulates in room. Pt denies pain at this time. Doctor notified.  Problem: Adult Inpatient Plan of Care  Goal: Plan of Care Review  Flowsheets (Taken 09/27/2022 0019)  Progress: no change  Plan of Care Reviewed With: patient  Goal: Patient-Specific Goal (Individualized)  Reactivated  Goal: Absence of Hospital-Acquired Illness or Injury  Reactivated  Goal: Optimal Comfort and Wellbeing  Reactivated  Goal: Readiness for Transition of Care  Reactivated  Goal: Rounds/Family Conference  Reactivated     Problem: Wound  Goal: Optimal Coping  Reactivated  Goal: Optimal Functional Ability  Reactivated  Goal: Absence of Infection Signs and Symptoms  Reactivated  Goal: Improved Oral Intake  Reactivated  Goal: Optimal Pain Control and Function  Reactivated  Goal: Skin Health and Integrity  Reactivated  Goal: Optimal Wound Healing  Reactivated     Problem: Fall Injury Risk  Goal: Absence of Fall and Fall-Related Injury  Reactivated

## 2022-09-27 NOTE — Unmapped (Addendum)
To do as outpatient:         Recurrent colitis presumably due to CellCept versus IBD   Persistent diarrhea, now bloody    Patient had recent hospitalization at [facility] for persistent and chronic diarrhea.  Diarrhea started around April. He had a CT abdomen in June demonstrating moderate severity descending and sigmoid colitis and was treated with multiple courses of antibiotics without improvement. C. difficile, GIPP has had remain negative. EBV, CMV and HSV also negative.  Despite treatment with antibiotics, diarrhea had persisted.  He had colonoscopy on 7/17 which showed severe mucosal changes throughout the colon and pathology showed active colitis with ulceration with differential including infectious etiology versus drug induced versus inflammatory bowel disease, with mycophenolate induced colitis versus inflammatory bowel disease thought to be more likely.  Patient was ultimately started on IV steroids and IV infliximab 10 mg/kg daily(first dose 8/3).  Patient with bloody diarrhea in the last week. Does have known hemorrhoids. On 8/3, hemoglobin found to be 6.6 and patient received 1 unit packed red blood cells and was responsive to fluids. He reports last bloody bowel movement on 8/1. Since receiving first dose of infliximab and starting steroids, patient notes improvement in diarrhea (5 episodes on 8/3, previously >10 episodes).  Will plan for continued infliximab and IV steroid treatment on admission, with hematology consultation.    Cryptogenic cirrhosis status post liver transplant recipient (2020)  Follows with [facility] hepatology.  As above, mycophenolate recently discontinued 7/27 due to concern for mycophenolate induced colitis.  Now only on tacrolimus 2 mg twice daily.  No tenderness to palpation on abdominal exam, abdomen is not distended or tense.  Will plan for GI consultation as above.

## 2022-09-27 NOTE — Unmapped (Signed)
Internal Medicine (MEDU) Progress Note    Assessment & Plan:   62 y.o. 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    Principal Problem:    Colitis  Active Problems:    Liver transplant recipient (CMS-HCC)    Diabetes mellitus without complication (CMS-HCC)    Hypothyroidism    OSA (obstructive sleep apnea)    Essential hypertension  Resolved Problems:    * No resolved hospital problems. *    Active Problems  Recurrent colitis presumably due to CellCept versus IBD   Persistent diarrhea   Acute blood loss anemia   Patient had recent hospitalization at [facility] for persistent and chronic diarrhea since April. CT abdomen in June demonstrating moderate severity descending and sigmoid colitis and was treated with multiple courses of antibiotics without improvement. C. difficile, GIPP has had remain negative. He had colonoscopy on 7/17 which showed severe mucosal changes throughout the colon and pathology showed active colitis with ulceration with differential including infectious etiology versus drug induced versus inflammatory bowel disease. Has had bloody Bms lately with recent Hgb of 6.6 requiring 1 unit pRBCs. He does have known hemorrhoids. Since receiving first dose of infliximab and starting steroids, patient notes improvement in diarrhea but had 2x diarrhea today with some blood.  - GI consulted, following    - Coordinating infliximab dosing   - Continue IV solumedrol 40mg  dailly steroids  - maintenance IVF at 159ml/hr  - Repleated K and mag  - Monitor H&H   - Continue Bactrim PJP prophylaxis while on chronic steroids  - Holding aspirin and DVT ppx    Cryptogenic cirrhosis status post liver transplant recipient (2020)  Mycophenolate recently discontinued 7/27 due to concern for mycophenolate induced colitis.  Now only on tacrolimus 2 mg twice daily.  No tenderness to palpation on abdominal exam, abdomen is not distended or tense.    -Continue tacrolimus 2 mg twice daily    Aortic stenosis status post AVR aortic valve replacement  Bioprosthetic aortic valve placed in 2023.  Not on anticoagulation.  Currently holding aspirin in setting of recent bleeding but need to restart soon when safe.  - Hold aspirin 325 mg daily     CAD status post CABG  Last echo in 2023 with left ejection fraction of 55 to 60% and functioning aortic heart valve.  -On telemetry   - Holding aspirin as above  - Hold metoprolol tartrate 25 mg twice daily    Chronic Problems  Hyperlipidemia-Continue prvastiatin 40mg  daily   Type 2 diabetes mellitus- Continue jardiance 10mg  daily   OSA-Continue nighttime CPAP   Hypertension-Hold metoprol tartrate 25mg  BID, soft blood pressures noted at outside hospital  Hypothyroidism-Continuej levothyroxine 137.  MDD: Continue sertraline 50mg  daily     Checklist:  Diet: Regular Diet  DVT PPx: SCDs  Code Status: Full Code  Dispo: Patient appropriate for Inpatient based on expectation of ongoing need for hospitalization greater than two midnights based on severity of presentation/services including bloody diarrhea requiring IVF and daily labs.    Team Contact Information:   Primary Team: Internal Medicine (MEDU)  Primary Resident: Lalla Brothers, DO  Resident's Pager: [phone] (Gen MedU Intern - Alvester Morin)    Interval history:   Patient states he feels almost to baseline today.  He says the infliximab has really helped his diarrhea. Although patient does not report any blood in stool, nurse says he has had two Bms today with bright red blood. Patient denies any abdominal pain, lightheadedness, SOB, chest pain. He is tolerating a regular diet. Does say he feels slightly weak but can ambulate on his own. No there acute complaints today.     Objective:   Physical Exam:  Temp:  [36.6 ??C (97.9 ??F)] 36.6 ??C (97.9 ??F)  Heart Rate:  [51-70] 63  Resp:  [16] 16  BP: (111-177)/(72-90) 111/72  SpO2:  [97 %-100 %] 100 %    Gen: NAD, converses   Heart: RRR  Lungs: CTAB, no crackles or wheezes  Abdomen: Soft, NTND  Extremities: trace edema   Psych: Alert, oriented

## 2022-09-27 NOTE — Unmapped (Incomplete)
Tacrolimus Therapeutic Monitoring Pharmacy Note    Anthony Alvarado is a 62 y.o. male {Blank single:19197::starting,continuing} tacrolimus.     Indication: {Blank single:19197::GVHD prophylaxis post allogeneic BMT,GVHD treatment post allogeneic BMT,Heart transplant,Heart and kidney transplant,Kidney transplant,Liver transplant,Lung transplant,Nephrotic syndrome,Pancreas transplant,Psoriasis,Rheumatoid arthritis,Severe aplastic anemia,***}     Date of Transplant: {Blank single:19197::***,Not applicable}      Prior Dosing Information: {Blank single:19197::None/new initiation,Current regimen *** ,Home regimen ***,Previous regimen ***}     Source(s) of information used to determine prior to admission dosing: {Source of Information:101902}    Goals:  Therapeutic Drug Levels  Tacrolimus trough goal: {Blank single:19197::3-5 ng/mL,4-6 ng/mL,5-8 ng/mL,5-15 ng/mL,8-10 ng/mL,10-12 ng/mL,10-15 ng/mL,Not applicable,***}    Additional Clinical Monitoring/Outcomes  Monitor renal function (SCr and urine output) and liver function (LFTs)  Monitor for signs/symptoms of adverse events (e.g., hyperglycemia, hyperkalemia, hypomagnesemia, hypertension, headache, tremor)    Results:   Tacrolimus level: {Blank single:19197::Not applicable,*** ng/mL, drawn appropriately,*** ng/mL, drawn ***,***}    Creatinine   Date Value Ref Range Status   09/15/2022 1.42 (H) 0.73 - 1.18 mg/dL Final   84/69/6295 2.84 0.76 - 1.27 mg/dL Final   13/24/4010 2.72 mg/dL Final        Pharmacokinetic Considerations and Significant Drug Interactions:  Concurrent hepatotoxic medications: {Blank single:19197::None identified,***}  Concurrent CYP3A4 substrates/inhibitors: {Blank single:19197::None identified,***}  Concurrent nephrotoxic medications: {Blank single:19197::None identified,***}    Assessment/Plan:  Recommendation(s)  {Blank single:19197::Start ***,Continue current regimen of ***,Increase to ***,Decrease to ***,***}    Follow-up  {Blank single:19197::Next level should be ordered on *** at ***,Next level has been ordered on *** at ***,No further levels indicated at this time,Next level to be determined by primary team,***}.   A pharmacist will continue to monitor and recommend levels as appropriate    Longitudinal Dose Monitoring:  Date Dose (mg), Route AM Scr (mg/dL) Level  (ng/mL) Key Drug Interactions            Please page service pharmacist with questions/clarifications.    Drue Flirt, [facility]

## 2022-09-27 NOTE — Unmapped (Signed)
Problem: Adult Inpatient Plan of Care  Goal: Plan of Care Review  09/27/2022 0408 by Crista Elliot, RN  Outcome: Ongoing - Unchanged  09/27/2022 0019 by Crista Elliot, RN  Flowsheets (Taken 09/27/2022 0019)  Progress: no change  Plan of Care Reviewed With: patient  Goal: Patient-Specific Goal (Individualized)  09/27/2022 0408 by Crista Elliot, RN  Outcome: Ongoing - Unchanged  09/27/2022 0019 by Crista Elliot, RN  Reactivated  Goal: Absence of Hospital-Acquired Illness or Injury  09/27/2022 0408 by Crista Elliot, RN  Outcome: Ongoing - Unchanged  09/27/2022 0019 by Crista Elliot, RN  Reactivated  Intervention: Identify and Manage Fall Risk  Recent Flowsheet Documentation  Taken 09/27/2022 0200 by Crista Elliot, RN  Safety Interventions:   fall reduction program maintained   lighting adjusted for tasks/safety   low bed  Taken 09/27/2022 0030 by Crista Elliot, RN  Safety Interventions: fall reduction program maintained  Intervention: Prevent Infection  Recent Flowsheet Documentation  Taken 09/27/2022 0200 by Crista Elliot, RN  Infection Prevention: hand hygiene promoted  Taken 09/27/2022 0030 by Crista Elliot, RN  Infection Prevention: hand hygiene promoted  Goal: Optimal Comfort and Wellbeing  09/27/2022 0408 by Crista Elliot, RN  Outcome: Ongoing - Unchanged  09/27/2022 0019 by Crista Elliot, RN  Reactivated  Goal: Readiness for Transition of Care  09/27/2022 0408 by Crista Elliot, RN  Outcome: Ongoing - Unchanged  09/27/2022 0019 by Crista Elliot, RN  Reactivated  Goal: Rounds/Family Conference  09/27/2022 0408 by Crista Elliot, RN  Outcome: Ongoing - Unchanged  09/27/2022 0019 by Crista Elliot, RN  Reactivated     Problem: Wound  Goal: Optimal Coping  09/27/2022 0408 by Crista Elliot, RN  Outcome: Ongoing - Unchanged  09/27/2022 0019 by Crista Elliot, RN  Reactivated  Goal: Optimal Functional Ability  09/27/2022 0408 by Crista Elliot, RN  Outcome: Ongoing - Unchanged  09/27/2022 0019 by Crista Elliot, RN  Reactivated  Goal: Absence of Infection Signs and Symptoms  09/27/2022 0408 by Crista Elliot, RN  Outcome: Ongoing - Unchanged  09/27/2022 0019 by Crista Elliot, RN  Reactivated  Intervention: Prevent or Manage Infection  Recent Flowsheet Documentation  Taken 09/27/2022 0200 by Crista Elliot, RN  Infection Management: aseptic technique maintained  Taken 09/27/2022 0030 by Crista Elliot, RN  Infection Management: aseptic technique maintained  Goal: Improved Oral Intake  09/27/2022 0408 by Crista Elliot, RN  Outcome: Ongoing - Unchanged  09/27/2022 0019 by Crista Elliot, RN  Reactivated  Goal: Optimal Pain Control and Function  09/27/2022 0408 by Crista Elliot, RN  Outcome: Ongoing - Unchanged  09/27/2022 0019 by Crista Elliot, RN  Reactivated  Goal: Skin Health and Integrity  09/27/2022 0408 by Crista Elliot, RN  Outcome: Ongoing - Unchanged  09/27/2022 0019 by Crista Elliot, RN  Reactivated  Goal: Optimal Wound Healing  09/27/2022 0408 by Crista Elliot, RN  Outcome: Ongoing - Unchanged  09/27/2022 0019 by Crista Elliot, RN  Reactivated     Problem: Fall Injury Risk  Goal: Absence of Fall and Fall-Related Injury  09/27/2022 0408 by Crista Elliot, RN  Outcome: Ongoing - Unchanged  09/27/2022 0019 by Crista Elliot, RN  Reactivated  Intervention: Promote Injury-Free Environment  Recent Flowsheet Documentation  Taken 09/27/2022 0200 by Crista Elliot, RN  Safety Interventions:   fall reduction program maintained  lighting adjusted for tasks/safety   low bed  Taken 09/27/2022 0030 by Crista Elliot, RN  Safety Interventions: fall reduction program maintained

## 2022-09-27 NOTE — Unmapped (Signed)
Patient alert and oriented x4. VSS on room air. Patient ambulates stand by assist throughout the room. Rounded on patient, call light and belongings within reach. Fall precautions in place, bed alarm on.     Problem: Adult Inpatient Plan of Care  Goal: Plan of Care Review  Outcome: Progressing  Goal: Patient-Specific Goal (Individualized)  Outcome: Progressing  Goal: Absence of Hospital-Acquired Illness or Injury  Outcome: Progressing  Intervention: Identify and Manage Fall Risk  Recent Flowsheet Documentation  Taken 09/27/2022 1000 by Angelina Sheriff, RN  Safety Interventions: fall reduction program maintained  Taken 09/27/2022 0800 by Angelina Sheriff, RN  Safety Interventions: fall reduction program maintained  Goal: Optimal Comfort and Wellbeing  Outcome: Progressing  Goal: Readiness for Transition of Care  Outcome: Progressing  Goal: Rounds/Family Conference  Outcome: Progressing     Problem: Wound  Goal: Optimal Coping  Outcome: Progressing  Goal: Optimal Functional Ability  Outcome: Progressing  Goal: Absence of Infection Signs and Symptoms  Outcome: Progressing  Goal: Improved Oral Intake  Outcome: Progressing  Goal: Optimal Pain Control and Function  Outcome: Progressing  Goal: Skin Health and Integrity  Outcome: Progressing  Goal: Optimal Wound Healing  Outcome: Progressing     Problem: Fall Injury Risk  Goal: Absence of Fall and Fall-Related Injury  Outcome: Progressing  Intervention: Promote Injury-Free Environment  Recent Flowsheet Documentation  Taken 09/27/2022 1000 by Angelina Sheriff, RN  Safety Interventions: fall reduction program maintained  Taken 09/27/2022 0800 by Angelina Sheriff, RN  Safety Interventions: fall reduction program maintained

## 2022-09-27 NOTE — Unmapped (Signed)
Internal Medicine (MEDU) History & Physical    Assessment & Plan:   62 y.o. 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 *    Active Problems  Recurrent colitis presumably due to CellCept versus IBD - Persistent diarrhea   Patient had recent hospitalization at [facility] for persistent and chronic diarrhea.  Diarrhea started around April.  He had a CT abdomen in June demonstrating moderate severity descending and sigmoid colitis and was treated with multiple courses of antibiotics without improvement.  C. difficile, GIPP has had remain negative.  EBV, CMV and HSV also negative.  Despite treatment with antibiotics, diarrhea has persisted.  He had colonoscopy on 7/17 which showed severe mucosal changes throughout the colon and pathology showed active colitis with ulceration with differential including infectious etiology versus drug induced versus inflammatory bowel disease, with mycophenolate induced colitis versus inflammatory bowel disease thought to be more likely.  Patient was ultimately started on IV steroids and IV infliximab 10 mg/kg daily(first dose 8/3).  Since receiving first dose of infliximab and starting steroids, patient notes improvement in diarrhea (5 episodes on 8/3, previously >10 episodes).  Will plan for continued infliximab and IV steroid treatment on admission, with hematology consultation.  - GI consult in a.m.  -Continue infliximab 10 mg/kilograms daily, coordinate with pharmacy on ordering if necessary   - Continue IV solumedrol 40mg  dailly steroids  -Continue maintenance IVF at 171ml/hr  -Trend CRP  - Continue supportive care with electrolyte repletion as necessary  - Continue Bactrim PJP prophylaxis while on chronic steroids    Cryptogenic cirrhosis status post liver transplant recipient (2020)  Follows with [facility] hepatology.  As above, mycophenolate recently discontinued 7/27 due to concern for mycophenolate induced colitis.  Now only on tacrolimus 2 mg twice daily.  No tenderness to palpation on abdominal exam, abdomen is not distended or tense.  Will plan for GI consultation as above.    -Continue tacrolimus 2 mg twice daily    Acute blood loss anemia - Bloody diarrhea   Patient with bloody diarrhea in the last week.  Does have known hemorrhoids.  On 8/3, hemoglobin found to be 6.6 and patient received 1 unit packed red blood cells and was responsive to fluids.  He reports last bloody bowel movement on 8/1.  - Recheck CBC in morning  - Holding antiplatelets, DVT prophylaxis    Aortic stenosis status post AVR aortic valve replacement  Bioprosthetic aortic valve placed in 2023.  Not on anticoagulation.  Currently holding aspirin in setting of recent bleeding.  - Hold aspirin 325 mg daily     CAD status post CABG  Last echo in 2023 with left ejection fraction of 55 to 60% and functioning aortic heart valve.  -On telemetry   - Holding aspirin as above  - Hold metoprolol tartrate 25 mg twice daily    Chronic Problems  Hyperlipidemia-Continue prvastiatin 40mg  daily   Type 2 diabetes mellitus: Recently stopped ozempic and metformin  given diarrhea/weight loss. Continue jardiance 10mg  daily   OSA-Continue nighttime CPAP   Hypertension-Hold metoprol tartrate 25mg  BID, soft blood pressures noted at outside hospital  Hypothyroidism-Continuej levothyroxine 137.  MDD: Continue sertraline 50mg  daily     Checklist:  Diet: Regular Diet  DVT PPx: SCDs  Code Status: Full Code  Dispo: Patient appropriate for Inpatient based on expectation of ongoing need for hospitalization greater than two midnights based on severity of presentation/services including      Team Contact Information:   Primary Team: Internal Medicine (MEDU)  Primary Resident: Grant Fontana, MD  Resident's Pager: [phone] (Gen MedU Intern - Alvester Morin)    Chief Concern:   Colitis      Subjective:   62 y.o. 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       HPI:  Patient has had persistent diarrhea since April 2024.  He has had a 50 pound weight loss since this time.  He he was treated with multiple courses of antibiotics which did not help.  Multiple C. difficile test and GI pathogen panel test were negative.  EBV, CMV and HSV were negative.  He underwent colonoscopy on 7/17 which showed severe mucosal changes with pathology suggesting possible mycophenolate induced colitis versus IBD.  He was started on a prolonged steroid taper and did feel somewhat better from a diarrhea standpoint, but then developed bloody diarrhea and presented to the ED.  He was found to have a hemoglobin of 6.6.  Most recently, he was started on infliximab and is now being transferred to [facility] for hepatology consultation.     On transfer to [facility], patient is comfortable appearing and hemodynamically stable.  He reports since starting infliximab yesterday he feels that his diarrhea has significantly improved and reports 5 episodes yesterday.  He was previously having over 10 episodes per day.    Pertinent Surgical Hx  Past Surgical History:   Procedure Laterality Date    APPENDECTOMY      CHG US GUIDE, TISSUE ABLATION N/A 05/03/2018    Procedure: ULTRASOUND GUIDANCE FOR, AND MONITORING OF, PARENCHYMAL TISSUE ABLATION;  Surgeon: Particia Nearing, MD;  Location: MAIN OR [facility];  Service: Transplant    LIVER TRANSPLANTATION  09/16/2018    PR CATH PLACE/CORON ANGIO, IMG SUPER/INTERP,R&L HRT CATH, L HRT VENTRIC N/A 03/21/2018    Procedure: Left/Right Heart Catheterization;  Surgeon: Neal Dy, MD;  Location: [facility] CATH;  Service: Cardiology    PR COLONOSCOPY W/BIOPSY SINGLE/MULTIPLE N/A 09/09/2022    Procedure: COLONOSCOPY, FLEXIBLE, PROXIMAL TO SPLENIC FLEXURE; WITH BIOPSY, SINGLE OR MULTIPLE;  Surgeon: Jules Husbands, MD;  [facility];  Service: Gastroenterology    PR DECORTICATION,PULMONARY,TOTAL Right 01/13/2019    Procedure: Decortic Pulm (Separt Proc); Tot;  Surgeon: Evert Kohl, MD;  Location: MAIN OR [facility];  Service: Thoracic    PR EGD FLEXIBLE FOREIGN BODY REMOVAL N/A 12/26/2018    Procedure: UGI ENDOSCOPY; W/REMOVAL FOREIGN BODY;  Surgeon: Vonda Antigua, MD;  Location: [facility];  Service: Gastroenterology    PR LAP,ABLAT 1+ LIVER TUMOR(S),RADIOFREQ N/A 05/03/2018    Procedure: LAPAROSCOPY, SURGICAL, ABLATION OF 1 OR MORE LIVER TUMOR(S); RADIOFREQUENCY;  Surgeon: Particia Nearing, MD;  Location: MAIN OR [place];  Service: Transplant    PR PERQ DRAINAGE PLEURA INSERT CATH W/IMAGING N/A 01/11/2019    Procedure: PLEURAL DRAINAGE, PERC, W INSERTION OF INDWELLING CATHETER; W IMAGING GUIDANCE;  Surgeon: Jerelyn Charles, MD;  Location: BRONCH PROCEDURE LAB [facility];  Service: Pulmonary    PR THORACENTESIS NEEDLE/CATH PLEURA W/IMAGING N/A 01/06/2019    Procedure: THORACENTESIS W/ IMAGING;  Surgeon: Cleora Fleet, MD;  Location: BRONCH PROCEDURE LAB [facility];  Service: Pulmonary    PR THORACOSCOPY SURG W/PLEURODESIS Right 01/13/2019    Procedure: THORACOSCOPY, SURGICAL; WITH PLEURODESIS (EG, MECHANICAL OR CHEMICAL);  Surgeon: Evert Kohl, MD;  Location: MAIN OR [facility];  Service: Thoracic    PR TRANSPLANT LIVER,ALLOTRANSPLANT Bilateral 09/16/2018    Procedure: LIVER ALLOTRANSPLANTATION; ORTHOTOPIC, PARTIAL OR WHOLE, FROM CADAVER OR LIVING DONOR, ANY AGE;  Surgeon: Florene Glen, MD;  Location: MAIN OR [place];  Service: Transplant    PR TRANSPLANT,PREP DONOR LIVER, WHOLE N/A 09/16/2018    Procedure: BACKBNCH STD PREP CAD DONOR WHOLE LIVER GFT PRIOR TNSPLNT,INC CHOLE,DISS/REM SURR TISSU WO TRISEG/LOBE SPLT;  Surgeon: Florene Glen, MD;  Location: MAIN OR [place];  Service: Transplant    SKIN BIOPSY          Pertinent Family Hx  Family History   Problem Relation Age of Onset    Asthma Mother     Hearing loss Mother     COPD Father     Cancer Father     Autoimmune disease Sister         AIH    Heart disease Sister     Colon cancer Maternal Grandfather     Liver disease Sister     Thyroid disease Sister     Thyroid disease Sister     Melanoma Neg Hx     Basal cell carcinoma Neg Hx     Squamous cell carcinoma Neg Hx         Pertinent Social Hx   Social History     Social History Narrative    Not on file        Allergies  Watermelon flavor    I reviewed the Medication List. The current list is Accurate  Prior to Admission medications    Medication Dose, Route, Frequency   aspirin 325 MG tablet 325 mg, Oral, Daily (standard)   azelastine (ASTELIN) 137 mcg (0.1 %) nasal spray 1 spray, Each Nare, Daily PRN   blood sugar diagnostic (ONETOUCH ULTRA TEST) Strp USE TO CHECK SUGAR DAILY   calcium carbonate (TUMS) 200 mg calcium (500 mg) chewable tablet 1 tablet, Oral, Daily (standard), Pt reports taking every other day.   FEROCON 110-0.5 mg capsule TAKE 1 CAPSULE BY MOUTH 2 (TWO) TIMES DAILY AFTER A MEAL. NOT COVERED BY INSURANCE.  Patient not taking: Reported on 09/15/2022   fluticasone propionate (FLONASE) 50 mcg/actuation nasal spray PLACE ONE OR TWO SPRAYS INTO BOTH NOSTRILS DAILY AS NEEDED.   JARDIANCE 10 mg tablet Pt reports that he has not taken this medication in about 1 month due to the price.   levothyroxine (SYNTHROID) 175 MCG tablet 175 mcg, Oral, Daily before breakfast   metoPROLOL tartrate (LOPRESSOR) 25 MG tablet 25 mg, Oral, 2 times a day (standard)   potassium chloride 20 MEQ ER tablet 20 mEq, Oral, Daily (standard)   pravastatin (PRAVACHOL) 40 MG tablet 40 mg, Oral, Every evening   predniSONE (DELTASONE) 10 MG tablet Take 4 tablets (40 mg total) by mouth daily for 14 days, THEN 3  tablets (30 mg total) daily for 7 days, THEN 2 tablets (20 mg total) daily for 7 days, THEN 1 tablet (10 mg total) daily for 7 days, THEN 0.5 tablets (5 mg total) daily for 7 days.   sertraline (ZOLOFT) 50 MG tablet 50 mg, Oral, Daily (standard)   sulfamethoxazole-trimethoprim (BACTRIM) 400-80 mg per tablet 1 tablet, Oral, Every Monday, Wednesday, Friday   tacrolimus (PROGRAF) 1 MG capsule 2 mg, Oral, 2 times a day   alendronate (FOSAMAX) 70 MG tablet Take 1 tablet (70 mg total) by mouth Every Sunday. Take with a full glass of water on an empty stomach.  Patient taking differently: Take 1 tablet (70 mg total) by mouth Every Sunday. Pt reports that he has missed a few doses.       Designated Environmental health practitioner:  [name] currently has decisional capacity for healthcare decision-making and is able to designate a surrogate healthcare decision maker. Mr. Buster's designated healthcare decision maker(s) is/are Lorin Picket (the patient's adult child) as denoted by stated patient preference.    Objective:   Physical Exam:  Temp:  [36.6 ??C (97.9 ??F)] 36.6 ??C (97.9 ??F)  Heart Rate:  [51] 51  Resp:  [16] 16  BP: (168-177)/(87-90) 168/87  SpO2:  [99 %] 99 %    Gen: NAD, converses   Heart: RRR. Loud S2  Lungs: CTAB, no crackles or wheezes  Abdomen: Soft, NTND  Extremities: +1 pitting edema   Psych: Alert, oriented

## 2022-09-28 DIAGNOSIS — Z5181 Encounter for therapeutic drug level monitoring: Principal | ICD-10-CM

## 2022-09-28 DIAGNOSIS — E612 Magnesium deficiency: Principal | ICD-10-CM

## 2022-09-28 DIAGNOSIS — Z944 Liver transplant status: Principal | ICD-10-CM

## 2022-09-28 LAB — COMPREHENSIVE METABOLIC PANEL
ALBUMIN: 2.4 g/dL — ABNORMAL LOW (ref 3.4–5.0)
ALKALINE PHOSPHATASE: 100 U/L (ref 46–116)
ALT (SGPT): 51 U/L — ABNORMAL HIGH (ref 10–49)
ANION GAP: 5 mmol/L (ref 5–14)
AST (SGOT): 40 U/L — ABNORMAL HIGH (ref <=34)
BILIRUBIN TOTAL: 0.3 mg/dL (ref 0.3–1.2)
BLOOD UREA NITROGEN: 13 mg/dL (ref 9–23)
BUN / CREAT RATIO: 14
CALCIUM: 7.9 mg/dL — ABNORMAL LOW (ref 8.7–10.4)
CHLORIDE: 103 mmol/L (ref 98–107)
CO2: 28 mmol/L (ref 20.0–31.0)
CREATININE: 0.92 mg/dL
EGFR CKD-EPI (2021) MALE: >90 mL/min/{1.73_m2} (ref >=60)
GLUCOSE RANDOM: 194 mg/dL — ABNORMAL HIGH (ref 70–179)
POTASSIUM: 4.7 mmol/L (ref 3.4–4.8)
PROTEIN TOTAL: 5.7 g/dL (ref 5.7–8.2)
SODIUM: 136 mmol/L (ref 135–145)

## 2022-09-28 LAB — CBC
HEMATOCRIT: 28.9 % — ABNORMAL LOW (ref 39.0–48.0)
HEMOGLOBIN: 9.4 g/dL — ABNORMAL LOW (ref 12.9–16.5)
MEAN CORPUSCULAR HEMOGLOBIN CONC: 32.6 g/dL (ref 32.0–36.0)
MEAN CORPUSCULAR HEMOGLOBIN: 27.8 pg (ref 25.9–32.4)
MEAN CORPUSCULAR VOLUME: 85.2 fL (ref 77.6–95.7)
MEAN PLATELET VOLUME: 6.5 fL — ABNORMAL LOW (ref 6.8–10.7)
PLATELET COUNT: 410 10*9/L (ref 150–450)
RED BLOOD CELL COUNT: 3.4 10*12/L — ABNORMAL LOW (ref 4.26–5.60)
RED CELL DISTRIBUTION WIDTH: 17.9 % — ABNORMAL HIGH (ref 12.2–15.2)
WBC ADJUSTED: 7.2 10*9/L (ref 3.6–11.2)

## 2022-09-28 LAB — CMV DNA, QUANTITATIVE, PCR
CMV QUANT: <35 [IU]/mL — ABNORMAL HIGH (ref <0)
CMV VIRAL LD: DETECTED — AB

## 2022-09-28 LAB — TACROLIMUS LEVEL, TIMED: TACROLIMUS BLOOD: 2.2 ng/mL

## 2022-09-28 LAB — C-REACTIVE PROTEIN: C-REACTIVE PROTEIN: 26 mg/L — ABNORMAL HIGH (ref <=10.0)

## 2022-09-28 MED ADMIN — tacrolimus (PROGRAF) capsule 2 mg: 2 mg | ORAL | @ 14:00:00

## 2022-09-28 MED ADMIN — sulfamethoxazole-trimethoprim (BACTRIM DS) 800-160 mg tablet 160 mg of trimethoprim: 1 | ORAL | @ 12:00:00 | Stop: 2022-10-18

## 2022-09-28 MED ADMIN — metoPROLOL tartrate (Lopressor) tablet 25 mg: 25 mg | ORAL | @ 12:00:00

## 2022-09-28 MED ADMIN — methylPREDNISolone sodium succinate (PF) (SOLU-Medrol) injection 20 mg: 20 mg | INTRAVENOUS | @ 18:00:00

## 2022-09-28 MED ADMIN — methylPREDNISolone sodium succinate (PF) (SOLU-Medrol) injection 20 mg: 20 mg | INTRAVENOUS | @ 02:00:00

## 2022-09-28 MED ADMIN — empagliflozin (JARDIANCE) tablet 10 mg: 10 mg | ORAL | @ 12:00:00

## 2022-09-28 MED ADMIN — tacrolimus (PROGRAF) capsule 2 mg: 2 mg | ORAL

## 2022-09-28 MED ADMIN — pravastatin (PRAVACHOL) tablet 40 mg: 40 mg | ORAL | @ 12:00:00

## 2022-09-28 MED ADMIN — insulin lispro (HumaLOG) injection 0-20 Units: 0-20 [IU] | SUBCUTANEOUS | @ 22:00:00

## 2022-09-28 MED ADMIN — insulin lispro (HumaLOG) injection 0-20 Units: 0-20 [IU] | SUBCUTANEOUS | @ 17:00:00

## 2022-09-28 MED ADMIN — levothyroxine (SYNTHROID) tablet 175 mcg: 175 ug | ORAL | @ 12:00:00

## 2022-09-28 MED ADMIN — methylPREDNISolone sodium succinate (PF) (SOLU-Medrol) injection 20 mg: 20 mg | INTRAVENOUS | @ 10:00:00

## 2022-09-28 MED ADMIN — metoPROLOL tartrate (Lopressor) tablet 25 mg: 25 mg | ORAL | @ 02:00:00

## 2022-09-28 MED ADMIN — sertraline (ZOLOFT) tablet 50 mg: 50 mg | ORAL | @ 12:00:00

## 2022-09-28 MED ADMIN — aspirin tablet 325 mg: 325 mg | ORAL | @ 22:00:00

## 2022-09-28 MED ADMIN — insulin lispro (HumaLOG) injection 0-20 Units: 0-20 [IU] | SUBCUTANEOUS | @ 03:00:00

## 2022-09-28 NOTE — Unmapped (Signed)
Good afternoon. The appt that the pt had for 08/06 has already been cancelled, it was cancelled on mychart by the pt

## 2022-09-28 NOTE — Unmapped (Addendum)
A&Ox4. VVS and afebrile. Room air then at night put on CPAP. Fall precaution. Bed in lowest position and bed alarm set. Patient is a standby assist. Call bell and personal items within reach.     Problem: Adult Inpatient Plan of Care  Goal: Plan of Care Review  Outcome: Progressing  Flowsheets (Taken 09/28/2022 0432)  Progress: improving  Plan of Care Reviewed With: patient  Goal: Patient-Specific Goal (Individualized)  Outcome: Progressing  Goal: Absence of Hospital-Acquired Illness or Injury  Outcome: Progressing  Intervention: Identify and Manage Fall Risk  Recent Flowsheet Documentation  Taken 09/28/2022 0400 by West Pugh, RN  Safety Interventions:   bed alarm   environmental modification   fall reduction program maintained   low bed   lighting adjusted for tasks/safety   nonskid shoes/slippers when out of bed  Taken 09/28/2022 0200 by West Pugh, RN  Safety Interventions:   bed alarm   environmental modification   fall reduction program maintained   lighting adjusted for tasks/safety   low bed   nonskid shoes/slippers when out of bed  Taken 09/28/2022 0000 by West Pugh, RN  Safety Interventions:   bed alarm   environmental modification   fall reduction program maintained   lighting adjusted for tasks/safety   low bed   nonskid shoes/slippers when out of bed  Taken 09/27/2022 2200 by West Pugh, RN  Safety Interventions:   bed alarm   environmental modification   fall reduction program maintained   low bed   lighting adjusted for tasks/safety   nonskid shoes/slippers when out of bed  Taken 09/27/2022 1930 by West Pugh, RN  Safety Interventions:   bed alarm   fall reduction program maintained   nonskid shoes/slippers when out of bed   low bed   lighting adjusted for tasks/safety   environmental modification  Intervention: Prevent Skin Injury  Recent Flowsheet Documentation  Taken 09/27/2022 2200 by West Pugh, RN  Positioning for Skin: Supine/Back  Intervention: Prevent and Manage VTE (Venous Thromboembolism) Risk  Recent Flowsheet Documentation  Taken 09/27/2022 2200 by West Pugh, RN  Anti-Embolism Intervention: Refused  Goal: Optimal Comfort and Wellbeing  Outcome: Progressing  Goal: Readiness for Transition of Care  Outcome: Progressing  Goal: Rounds/Family Conference  Outcome: Progressing     Problem: Wound  Goal: Optimal Coping  Outcome: Progressing  Goal: Optimal Functional Ability  Outcome: Progressing  Intervention: Optimize Functional Ability  Recent Flowsheet Documentation  Taken 09/27/2022 2200 by West Pugh, RN  Activity Management: ambulated to bathroom  Goal: Absence of Infection Signs and Symptoms  Outcome: Progressing  Goal: Improved Oral Intake  Outcome: Progressing  Goal: Optimal Pain Control and Function  Outcome: Progressing  Goal: Skin Health and Integrity  Outcome: Progressing  Intervention: Optimize Skin Protection  Recent Flowsheet Documentation  Taken 09/27/2022 2200 by West Pugh, RN  Activity Management: ambulated to bathroom  Goal: Optimal Wound Healing  Outcome: Progressing     Problem: Fall Injury Risk  Goal: Absence of Fall and Fall-Related Injury  Outcome: Progressing  Intervention: Promote Injury-Free Environment  Recent Flowsheet Documentation  Taken 09/28/2022 0400 by West Pugh, RN  Safety Interventions:   bed alarm   environmental modification   fall reduction program maintained   low bed   lighting adjusted for tasks/safety   nonskid shoes/slippers when out of bed  Taken 09/28/2022 0200 by West Pugh, RN  Safety Interventions:   bed alarm   environmental modification  fall reduction program maintained   lighting adjusted for tasks/safety   low bed   nonskid shoes/slippers when out of bed  Taken 09/28/2022 0000 by West Pugh, RN  Safety Interventions:   bed alarm   environmental modification   fall reduction program maintained   lighting adjusted for tasks/safety   low bed   nonskid shoes/slippers when out of bed  Taken 09/27/2022 2200 by West Pugh, RN  Safety Interventions:   bed alarm   environmental modification   fall reduction program maintained   low bed   lighting adjusted for tasks/safety   nonskid shoes/slippers when out of bed  Taken 09/27/2022 1930 by West Pugh, RN  Safety Interventions:   bed alarm   fall reduction program maintained   nonskid shoes/slippers when out of bed   low bed   lighting adjusted for tasks/safety   environmental modification

## 2022-09-28 NOTE — Unmapped (Signed)
Patient called to advise that he is currently inpatient at [facility] and will need to cancel his GI appointment for tomorrow. Nurse reviewed chart and do not see a current GI appointment. Will route message to [facility] Appts for confirmation of appointment cancellation.

## 2022-09-28 NOTE — Unmapped (Signed)
Tacrolimus Therapeutic Monitoring Pharmacy Note  Anthony Alvarado is a 62 y.o. male continuing tacrolimus.     Indication: Liver transplant     Date of Transplant:  08/2018       Prior Dosing Information: Home regimen 2 mg BID      Source(s) of information used to determine prior to admission dosing: Home Medication List, Clinic Note, or Fill HIstory    Goals:  Therapeutic Drug Levels  Tacrolimus trough goal:  4-6 ng/mL  per 8/4 consult note    Additional Clinical Monitoring/Outcomes  Monitor renal function (SCr and urine output) and liver function (LFTs)  Monitor for signs/symptoms of adverse events (e.g., hyperglycemia, hyperkalemia, hypomagnesemia, hypertension, headache, tremor)    Results:     Creatinine   Date Value Ref Range Status   09/28/2022 0.92 0.73 - 1.18 mg/dL Final   29/52/8413 2.44 0.73 - 1.18 mg/dL Final   02/25/7251 6.64 (H) 0.73 - 1.18 mg/dL Final        Pharmacokinetic Considerations and Significant Drug Interactions:  Concurrent hepatotoxic medications:  pravastatin (hm)    Concurrent CYP3A4 substrates/inhibitors: None identified at the time of this note  Concurrent nephrotoxic medications:  Bactrim, Jardiance (hm)      Tacrolimus Dosing  Tacro 2mg  AM and 2 mg PM (8/4...)    Tacrolimus Level  8/5 = 2.2 ng/mL at 1000 ~ 13.5h after last dose (8/4 2mg  at ~2030)    Assessment/Plan:  8/5 Scr 0.92  Tacro level below goal but drawn late - true trough higher but may not be at goal.    Recommendation(s) unless otherwise indicated by TRANSPLANT TEAM  Continue current regimen of 2 mg BID  Use 8/6 level to adjust dose as needed    Follow-up  Tacrolimus Level:  DAILY between 0600-0800 PRIOR to morning dose.  Dose due 0800  A pharmacist will continue to monitor and recommend levels as appropriate    Longitudinal Dose Monitoring:  Date Dose (mg), Route AM Scr (mg/dL) Level  (ng/mL) Key Drug Interactions   10/01/22       09/30/22       09/29/22       09/28/22 2 mg AM  2mg  PM PO 0.92       Please page service pharmacist with questions/clarifications.    Geanie Berlin PY4   Marylene Buerger, PharmD  Clinical Specialist - Internal Medicine  [facility], [facility] APP Team Pharmacist  September 28, 2022 4:11 PM

## 2022-09-28 NOTE — Unmapped (Signed)
Hepatology Consult Service   Treatment Plan         Recommendations:   Anthony Alvarado is a 62 y.o. male who presented to [facility] with hematochezia. We have been following for  immunosuppression management in a post transplant patient .    S/p OLT in 2020: no h/o rejection, no biliary issues. Did have biopsy in 2022 with significant steatosis, may have element of MASLD. Has been maintained on tacrolimus and cellcept. Cellcept currently being held, d/t concerns that it may be contributing to his diarrhea.    Would continue tacrolimus. Trough today is 2.2, although was drawn about 90 minutes late, so not a true trough. Agree with pharmacy to continue at current dose of 2 mg BID.    --continue with daily LFTs  --continue tac, goal trough 4-6. Trough today 2.2, but not a true trough. Continue at current dose of 2 mg BID        Thank you for involving [place] in the care of your patient. We will continue to follow along with you.

## 2022-09-28 NOTE — Unmapped (Signed)
Hepatology Consult Service   Initial Consultation         Assessment and Recommendations:   Anthony Alvarado is a 62 y.o. male with a PMHx of cryptogenic cirrhosis s/p OLT 08/2018 who presented to [facility] as OSH with persistent hematochezia and findings concerning for cellcept mediated colitis vs new diagnosis of IBD. The patient is seen in consultation at the request of Vonda Antigua, MD ([facility] ([facility])) for new IBD vs cellcept mediated colitis.    Hematochezia l New IBD vs Cellcept induced Colitis  Patient has been dealing with prolonged diarrhea since 05/2022 with waxing and waning symptoms and hematochezia.  Initially presented to [facility] on 7/29.  Initially thought that diarrhea was as a result of starting Ozempic but CT scans continue to show descending sigmoid colitis.  He had been treated with multiple courses of antibiotics and C. difficile/infectious etiology were tested.  Underwent colonoscopy on 7/17 with severe mucosal changes characterized by edema, erythema, friability, deep ulcerations throughout the entire examined colon.  Pathology was consistent with active severe colitis.  He was started on a prolonged prednisone taper and received prophylactic Bactrim therapy.  He continued to have significant bloody diarrhea which caused his recurrent presentation to the hospital.  He stopped his CellCept on 7/27 according to documentation.    Underwent repeat EGD and flexible sigmoidoscopy at [facility].  Flexible sigmoidoscopy showed diffuse inflammation characterized by congestion, granularity, mucus and shallow ulcerations in the rectum, sigmoid, descending and transverse colon.  EGD had gastritis and duodenitis both biopsied.  Pathology showed moderately active colitis with chronicity negative for CMV.  CRP has been as elevated as 88 on 09/22/2022 but has gradually been improving on steroid treatment.  Status post infliximab high dose 10 mg/kg on 09/26/2022 and feels symptomatically improved following this. Will continue to treat as if severe IBD flare given symptomatic improvement and closely monitor in case of needed repeat infliximab dose. Withdrawal from cellcept should have quick improvement if this is the causative agent.    Cryptogenic Cirrhosis s/p OLT 2020  Follows with Dr. Sherryll Burger and Priscille Heidelberg. Was prior on tacrolimus and cellcept but instructed to hold shortly before presentation to [facility]. LFTs within normal limits.     Recommendations  Cdiff and GIPP negative on 08/16/22  Order Quantiferon gold, HCV antibody, HIV screen, TPMT and NUDT15 genotype, HepB serologies: surface antigen, surface antibody, total core antibody  Order Vitamin D25, vitamin B12, iron panel, ferritin; supplement if deficient  Start chemical deep vein thrombosis prophylaxis (patient is at high risk for clots in setting of IBD flare)  Order daily CRP, CMP, CBC  IV methylprednisolone 20mg  every 8 hours  Please print and provide patient with stool diary (page 7) and instruct patient to keep log of bowel movements while admitted   Continue home tacrolimus 2mg  BID tac goal 4-6, appreciate pharmacy consult  Obtain daily Tac trough  Continue to hold cellcept  Obtain CMV Serum PCR  Agree with continued Ppx bactrim with prolonged steroid use and increased immunosuppression    Issues Impacting Complexity of Management:  -None    Recommendations discussed with the patient's primary team. We will continue to follow along with you.    Subjective:   Patient presents as outside hospital transfer in the setting of continued hematochezia and colitis of unknown origin whether new inflammatory bowel disease or CellCept mediated.  Patient has had persistent diarrhea since April 2024 with the addition of a 50 pound weight loss.  He has been treated with multiple courses of antibiotics and has received an outpatient workup with a colonoscopy.  Currently at the bedside he does feel mildly improved after receiving high-dose infliximab yesterday.  He additionally has been on IV steroids.  He says that his stools are more formed and have less blood in them.  He initially presented to outside hospital with acute anemia with a hemoglobin of 6.6 requiring transfusion and significant fatigue. He has otherwise been following with [facility] hepatology and taking his transplant medication as prescribed.  He currently denies any fevers, chills, night sweats, nausea, vomiting, significant abdominal pain.  He denies any confusion.    Objective:   Temp:  [36.6 ??C (97.9 ??F)] 36.6 ??C (97.9 ??F)  Heart Rate:  [51-74] 74  Resp:  [16] 16  BP: (111-177)/(72-90) 111/72  SpO2:  [97 %-100 %] 100 %    Gen: Well appearing male in NAD, answers questions appropriately  Eyes: Sclera anicteric  Abdomen: Normoactive bowel sounds, soft, NTND, no rebound/guarding, no hepatosplenomegaly  Extremities: No clubbing, cyanosis, or edema in the BLEs  Neuro: Normal speech. No asterixis.   Skin: No rashes, lesions on clothed exam  Psych: Alert, normal mood and affect.     Pertinent Labs/Studies:  Mentioned in AP and HPI

## 2022-09-28 NOTE — Unmapped (Addendum)
Patient alert and oriented x4. VSS on room air. Patient ambulated to bathroom and around the unit stand by assist. Patient had 2 bowel movements, soft, brown, no noticeable blood. Rounded on patient, call light and belongings within reach. Fall precautions in place, bed alarm on.     Problem: Adult Inpatient Plan of Care  Goal: Plan of Care Review  09/28/2022 1013 by Angelina Sheriff, RN  Outcome: Progressing  09/28/2022 0746 by Angelina Sheriff, RN  Outcome: Progressing  Goal: Patient-Specific Goal (Individualized)  09/28/2022 1013 by Angelina Sheriff, RN  Outcome: Progressing  09/28/2022 0746 by Angelina Sheriff, RN  Outcome: Progressing  Goal: Absence of Hospital-Acquired Illness or Injury  09/28/2022 1013 by Angelina Sheriff, RN  Outcome: Progressing  09/28/2022 0746 by Angelina Sheriff, RN  Outcome: Progressing  Intervention: Prevent and Manage VTE (Venous Thromboembolism) Risk  Recent Flowsheet Documentation  Taken 09/28/2022 0730 by Angelina Sheriff, RN  Anti-Embolism Device Type: SCD, Knee  Anti-Embolism Intervention: On  Anti-Embolism Device Location: BLE  Goal: Optimal Comfort and Wellbeing  09/28/2022 1013 by Angelina Sheriff, RN  Outcome: Progressing  09/28/2022 0746 by Angelina Sheriff, RN  Outcome: Progressing  Goal: Readiness for Transition of Care  09/28/2022 1013 by Angelina Sheriff, RN  Outcome: Progressing  09/28/2022 0746 by Angelina Sheriff, RN  Outcome: Progressing  Goal: Rounds/Family Conference  09/28/2022 1013 by Angelina Sheriff, RN  Outcome: Progressing  09/28/2022 0746 by Angelina Sheriff, RN  Outcome: Progressing     Problem: Wound  Goal: Optimal Coping  09/28/2022 1013 by Angelina Sheriff, RN  Outcome: Progressing  09/28/2022 0746 by Angelina Sheriff, RN  Outcome: Progressing  Goal: Optimal Functional Ability  09/28/2022 1013 by Angelina Sheriff, RN  Outcome: Progressing  09/28/2022 0746 by Angelina Sheriff, RN  Outcome: Progressing  Goal: Absence of Infection Signs and Symptoms  09/28/2022 1013 by Angelina Sheriff, RN  Outcome: Progressing  09/28/2022 0746 by Angelina Sheriff, RN  Outcome: Progressing  Goal: Improved Oral Intake  09/28/2022 1013 by Angelina Sheriff, RN  Outcome: Progressing  09/28/2022 0746 by Angelina Sheriff, RN  Outcome: Progressing  Goal: Optimal Pain Control and Function  09/28/2022 1013 by Angelina Sheriff, RN  Outcome: Progressing  09/28/2022 0746 by Angelina Sheriff, RN  Outcome: Progressing  Goal: Skin Health and Integrity  09/28/2022 1013 by Angelina Sheriff, RN  Outcome: Progressing  09/28/2022 0746 by Angelina Sheriff, RN  Outcome: Progressing  Goal: Optimal Wound Healing  09/28/2022 1013 by Angelina Sheriff, RN  Outcome: Progressing  09/28/2022 0746 by Angelina Sheriff, RN  Outcome: Progressing     Problem: Fall Injury Risk  Goal: Absence of Fall and Fall-Related Injury  09/28/2022 1013 by Angelina Sheriff, RN  Outcome: Progressing  09/28/2022 0746 by Angelina Sheriff, RN  Outcome: Progressing

## 2022-09-28 NOTE — Unmapped (Signed)
Hepatology Consult Service   Progress note         Assessment and Recommendations:   Anthony Alvarado is a 62 y.o. male with a PMHx of cryptogenic cirrhosis s/p OLT 08/2018 who presented to [facility] as OSH with persistent hematochezia and findings concerning for cellcept mediated colitis vs new diagnosis of IBD. The patient is seen in consultation at the request of Ruffin Pyo, MD ([facility] ([facility])) for new IBD vs cellcept mediated colitis.    Hematochezia l New IBD vs Cellcept induced Colitis  Patient has been dealing with prolonged diarrhea since 05/2022 with waxing and waning symptoms and hematochezia.  Initially presented to [facility] on 7/29.  Initially thought that diarrhea was as a result of starting Ozempic but CT scans continue to show descending sigmoid colitis.  He had been treated with multiple courses of antibiotics and C. difficile/infectious etiology were tested.  Underwent colonoscopy on 7/17 with severe mucosal changes characterized by edema, erythema, friability, deep ulcerations throughout the entire examined colon.  Pathology was consistent with active severe colitis.  He was started on a prolonged prednisone taper and received prophylactic Bactrim therapy.  He continued to have significant bloody diarrhea which caused his recurrent presentation to the hospital.  He stopped his CellCept on 7/27 according to documentation.    Underwent repeat EGD and flexible sigmoidoscopy at [facility].  Flexible sigmoidoscopy showed diffuse inflammation characterized by congestion, granularity, mucus and shallow ulcerations in the rectum, sigmoid, descending and transverse colon.  EGD had gastritis and duodenitis both biopsied.  Pathology showed moderately active colitis with chronicity negative for CMV.  CRP has been as elevated as 88 on 09/22/2022 but has gradually been improving on steroid treatment.  Status post infliximab high dose 10 mg/kg on 09/26/2022 and feels symptomatically improved following this. Will continue to treat as if severe IBD flare given symptomatic improvement and closely monitor in case of needed repeat infliximab dose. Withdrawal from cellcept should have quick improvement if this is the causative agent.    He has responded well with only a few non bloody BMs as of 09/28/22. This may represent IBD with response to inflixamab versus mycophenolate associated colitis with response to withdraw of mycophenolate. To better differentiate, we will hold additional doses of infliximab for now and may consider switching steroids to oral dosing on 08/07 (will continue to evaluate daily and that may change based on ongoing clinical condition).    Cryptogenic Cirrhosis s/p OLT 2020  Follows with Dr. Sherryll Burger and Priscille Heidelberg. Was prior on tacrolimus and cellcept but instructed to hold shortly before presentation to [facility]. LFTs within normal limits.     Work up completed:  Cdiff and GIPP negative on 08/16/22  Serum CMV detected at <35 international unit/mL on 09/27/22  Vtamin B12 and D normal on 09/27/22  Ferritin=192 and Fe Sat=13% on 09/27/22    Recommendations  Follow up Quantiferon gold, HCV antibody, HIV screen, TPMT and NUDT15 genotype, HepB serologies: surface antigen, surface antibody, total core antibody  Continue chemical deep vein thrombosis prophylaxis (patient is at high risk for clots in setting of IBD flare)  Order daily CRP, CMP, CBC  Continue IV methylprednisolone 20mg  every 8 hours  Please print and provide patient with stool diary (page 7) and instruct patient to keep log of bowel movements while admitted   Continue home tacrolimus 2mg  BID tac goal 4-6, appreciate pharmacy consult  Obtain daily Tac trough  Continue to hold cellcept  Agree with continued  Ppx bactrim with prolonged steroid use and increased immunosuppression    Issues Impacting Complexity of Management:  -None    Recommendations discussed with the patient's primary team. We will continue to follow along with you.    Subjective:   He reports feeling better. BM frequency has improved. He has one normal and one small BM overnight without any blood. He denies abdominal pain, nausea, vomiting, dysphagia, and odynophagia.    Objective:   Temp:  [36.4 ??C (97.5 ??F)-36.6 ??C (97.9 ??F)] 36.4 ??C (97.5 ??F)  Heart Rate:  [49-74] 62  Resp:  [16-18] 18  BP: (111-154)/(69-110) 117/69  FiO2 (%):  [21 %] 21 %  SpO2:  [99 %-100 %] 100 %    Gen: Well appearing male in NAD, answers questions appropriately  Abdomen: Normoactive bowel sounds, soft, NTND, no rebound/guarding, no hepatosplenomegaly  Extremities: No edema in the BLEs    Pertinent Labs/Studies:  I reviewed the patient's labs dated 08/04/20245 which showed WBC=9.8,  Hg=9.4, plt=398, normal renal function, total protein=5.5, alb=2.1, CRP=27, Iron sat=13%, ferritin=192

## 2022-09-29 LAB — COMPREHENSIVE METABOLIC PANEL
ALBUMIN: 2.5 g/dL — ABNORMAL LOW (ref 3.4–5.0)
ALKALINE PHOSPHATASE: 85 U/L (ref 46–116)
ALT (SGPT): 40 U/L (ref 10–49)
ANION GAP: 5 mmol/L (ref 5–14)
AST (SGOT): 19 U/L (ref <=34)
BILIRUBIN TOTAL: 0.4 mg/dL (ref 0.3–1.2)
BLOOD UREA NITROGEN: 11 mg/dL (ref 9–23)
BUN / CREAT RATIO: 13
CALCIUM: 8.4 mg/dL — ABNORMAL LOW (ref 8.7–10.4)
CHLORIDE: 107 mmol/L (ref 98–107)
CO2: 25 mmol/L (ref 20.0–31.0)
CREATININE: 0.88 mg/dL
EGFR CKD-EPI (2021) MALE: >90 mL/min/{1.73_m2} (ref >=60)
GLUCOSE RANDOM: 150 mg/dL (ref 70–179)
POTASSIUM: 5.2 mmol/L — ABNORMAL HIGH (ref 3.4–4.8)
PROTEIN TOTAL: 5.7 g/dL (ref 5.7–8.2)
SODIUM: 137 mmol/L (ref 135–145)

## 2022-09-29 LAB — MAGNESIUM: MAGNESIUM: 1.9 mg/dL (ref 1.6–2.6)

## 2022-09-29 LAB — CBC
HEMATOCRIT: 29.2 % — ABNORMAL LOW (ref 39.0–48.0)
HEMOGLOBIN: 9.6 g/dL — ABNORMAL LOW (ref 12.9–16.5)
MEAN CORPUSCULAR HEMOGLOBIN CONC: 33 g/dL (ref 32.0–36.0)
MEAN CORPUSCULAR HEMOGLOBIN: 28.2 pg (ref 25.9–32.4)
MEAN CORPUSCULAR VOLUME: 85.6 fL (ref 77.6–95.7)
MEAN PLATELET VOLUME: 6.3 fL — ABNORMAL LOW (ref 6.8–10.7)
PLATELET COUNT: 420 10*9/L (ref 150–450)
RED BLOOD CELL COUNT: 3.42 10*12/L — ABNORMAL LOW (ref 4.26–5.60)
RED CELL DISTRIBUTION WIDTH: 18.7 % — ABNORMAL HIGH (ref 12.2–15.2)
WBC ADJUSTED: 9.4 10*9/L (ref 3.6–11.2)

## 2022-09-29 LAB — TACROLIMUS LEVEL, TIMED: TACROLIMUS BLOOD: 2.3 ng/mL

## 2022-09-29 LAB — C-REACTIVE PROTEIN: C-REACTIVE PROTEIN: 15 mg/L — ABNORMAL HIGH (ref <=10.0)

## 2022-09-29 MED ADMIN — methylPREDNISolone sodium succinate (PF) (SOLU-Medrol) injection 20 mg: 20 mg | INTRAVENOUS | @ 02:00:00

## 2022-09-29 MED ADMIN — methylPREDNISolone sodium succinate (PF) (SOLU-Medrol) injection 20 mg: 20 mg | INTRAVENOUS | @ 10:00:00 | Stop: 2022-09-29

## 2022-09-29 MED ADMIN — tacrolimus (PROGRAF) capsule 2 mg: 2 mg | ORAL | @ 13:00:00 | Stop: 2022-09-29

## 2022-09-29 MED ADMIN — predniSONE (DELTASONE) tablet 40 mg: 40 mg | ORAL | @ 20:00:00

## 2022-09-29 MED ADMIN — metoPROLOL tartrate (Lopressor) tablet 25 mg: 25 mg | ORAL | @ 13:00:00

## 2022-09-29 MED ADMIN — sulfamethoxazole-trimethoprim (BACTRIM DS) 800-160 mg tablet 160 mg of trimethoprim: 1 | ORAL | @ 13:00:00 | Stop: 2022-10-18

## 2022-09-29 MED ADMIN — insulin lispro (HumaLOG) injection 0-20 Units: 0-20 [IU] | SUBCUTANEOUS | @ 22:00:00

## 2022-09-29 MED ADMIN — metoPROLOL tartrate (Lopressor) tablet 25 mg: 25 mg | ORAL | @ 01:00:00

## 2022-09-29 MED ADMIN — pravastatin (PRAVACHOL) tablet 40 mg: 40 mg | ORAL | @ 13:00:00

## 2022-09-29 MED ADMIN — sertraline (ZOLOFT) tablet 50 mg: 50 mg | ORAL | @ 13:00:00

## 2022-09-29 MED ADMIN — levothyroxine (SYNTHROID) tablet 175 mcg: 175 ug | ORAL | @ 13:00:00

## 2022-09-29 MED ADMIN — tacrolimus (PROGRAF) capsule 2 mg: 2 mg | ORAL | @ 01:00:00

## 2022-09-29 MED ADMIN — insulin lispro (HumaLOG) injection 0-20 Units: 0-20 [IU] | SUBCUTANEOUS | @ 16:00:00

## 2022-09-29 MED ADMIN — insulin lispro (HumaLOG) injection 0-20 Units: 0-20 [IU] | SUBCUTANEOUS | @ 13:00:00

## 2022-09-29 MED ADMIN — aspirin tablet 325 mg: 325 mg | ORAL | @ 13:00:00

## 2022-09-29 MED ADMIN — insulin lispro (HumaLOG) injection 0-20 Units: 0-20 [IU] | SUBCUTANEOUS | @ 02:00:00

## 2022-09-29 MED ADMIN — empagliflozin (JARDIANCE) tablet 10 mg: 10 mg | ORAL | @ 13:00:00

## 2022-09-29 NOTE — Unmapped (Signed)
Hepatology Consult Service   Progress note         Assessment and Recommendations:   Anthony Alvarado is a 62 y.o. male with a PMHx of cryptogenic cirrhosis s/p OLT 08/2018 who presented to [facility] as OSH with persistent hematochezia and findings concerning for cellcept mediated colitis vs new diagnosis of IBD. The patient is seen in consultation at the request of Ruffin Pyo, MD (Med [facility] (MD[facility])) for new IBD vs cellcept mediated colitis.    Hematochezia l New IBD vs Cellcept induced Colitis  Patient has been dealing with prolonged diarrhea since 05/2022 with waxing and waning symptoms and hematochezia.  Initially presented to [facility] on 7/29.  Initially thought that diarrhea was as a result of starting Ozempic but CT scans continue to show descending sigmoid colitis.  He had been treated with multiple courses of antibiotics and C. difficile/infectious etiology were tested.  Underwent colonoscopy on 7/17 with severe mucosal changes characterized by edema, erythema, friability, deep ulcerations throughout the entire examined colon.  Pathology was consistent with active severe colitis.  He was started on a prolonged prednisone taper and received prophylactic Bactrim therapy.  He continued to have significant bloody diarrhea which caused his recurrent presentation to the hospital.  He stopped his CellCept on 7/27 according to documentation.    Underwent repeat EGD and flexible sigmoidoscopy at [facility].  Flexible sigmoidoscopy showed diffuse inflammation characterized by congestion, granularity, mucus and shallow ulcerations in the rectum, sigmoid, descending and transverse colon.  EGD had gastritis and duodenitis both biopsied.  Pathology showed moderately active colitis with chronicity negative for CMV.  CRP has been as elevated as 88 on 09/22/2022 but has gradually been improving on steroid treatment.  Status post infliximab high dose 10 mg/kg on 09/26/2022 and feels symptomatically improved following this. Will continue to treat as if severe IBD flare given symptomatic improvement and closely monitor in case of needed repeat infliximab dose. Withdrawal from cellcept should have quick improvement if this is the causative agent.    He has responded well with only a few non bloody BMs as of 09/28/22. This may represent IBD with response to inflixamab versus mycophenolate associated colitis with response to withdraw of mycophenolate. To better differentiate, we will hold additional doses of infliximab for now and may consider switching steroids to oral dosing on 08/07 (will continue to evaluate daily and that may change based on ongoing clinical condition).    Cryptogenic Cirrhosis s/p OLT 2020  Follows with Dr. Sherryll Burger and Priscille Heidelberg. Was prior on tacrolimus and cellcept but instructed to hold shortly before presentation to [facility]. LFTs within normal limits.     Work up completed:  Cdiff and GIPP negative on 08/16/22  Serum CMV detected at <35 international unit/mL on 09/27/22  Vtamin B12 and D normal on 09/27/22  Ferritin=192 and Fe Sat=13% on 09/27/22    Recommendations  Follow up Quantiferon gold, HCV antibody, HIV screen, TPMT and NUDT15 genotype, HepB serologies: surface antigen, surface antibody, total core antibody  Continue chemical deep vein thrombosis prophylaxis (patient is at high risk for clots in setting of IBD flare)  Order daily CRP, CMP, CBC  Stop IV methylprednisolone, start prednisone 40mg  daily today  Please print and provide patient with stool diary (page 7) and instruct patient to keep log of bowel movements while admitted   Continue to hold cellcept indefinitely  Agree with continued Ppx bactrim with prolonged steroid use and increased immunosuppression    Issues Impacting Complexity  of Management:  -None    Recommendations discussed with the patient's primary team. We will continue to follow along with you.    Subjective:   He has 3 formed BMs overnight without blood. He denies abdominal pain, nausea, and vomiting    Objective:   Temp:  [36.4 ??C (97.6 ??F)-37 ??C (98.6 ??F)] 36.6 ??C (97.9 ??F)  Heart Rate:  [49-67] 64  Resp:  [16-19] 19  BP: (111-153)/(75-98) 111/75  FiO2 (%):  [21 %] 21 %  SpO2:  [94 %-100 %] 100 %    Gen: Well appearing male in NAD, answers questions appropriately  Abdomen: Normoactive bowel sounds, soft, NTND, no rebound/guarding  Extremities: No edema in the BLEs    Pertinent Labs/Studies:  I reviewed the patient's labs dated 08/06/20245 which showed WBC=9.4,  Hg=9.6, plt=420, normal renal function, total protein=5.7, alb=2.5, CRP=15

## 2022-09-29 NOTE — Unmapped (Signed)
Internal Medicine (MEDU) Progress Note    Assessment & Plan:   62 y.o. 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    Principal Problem:    Colitis  Active Problems:    Liver transplant recipient (CMS-HCC)    Diabetes mellitus without complication (CMS-HCC)    Hypothyroidism    OSA (obstructive sleep apnea)    Essential hypertension  Resolved Problems:    * No resolved hospital problems. *    Active Problems    Recurrent colitis presumably due to CellCept versus IBD   Persistent diarrhea - improved  Acute blood loss anemia   He had colonoscopy on 7/17 which showed severe mucosal changes throughout the colon and pathology showed active colitis with ulceration with differential including infectious etiology versus drug induced versus inflammatory bowel disease. Has had bloody Bms lately with recent Hgb of 6.6 requiring 1 unit pRBCs. He does have known hemorrhoids. Since receiving first dose of infliximab and starting steroids, patient notes improvement in diarrhea but had 2x diarrhea today with some blood. Hgb has been stable.   - GI consulted, following    - Coordinating infliximab dosing   - Continue IV methylprednisone 20 mg q8h  - follow up Quantiferon gold, HCV antibody, HIV screen, TPMT and NUDT15 genotype, HepB serologies: surface antigen, surface antibody, total core antibody   - provided stool diary   - obtain daily tac trough  - cont holding cellcept   - Monitor H&H   - Consider iron replacement   - Continue Bactrim PJP prophylaxis while on chronic steroids  - Restarting aspirin and DVT ppx    Cryptogenic cirrhosis status post liver transplant recipient (2020)  Mycophenolate recently discontinued 7/27 due to concern for mycophenolate induced colitis.  Now only on tacrolimus 2 mg twice daily.  No tenderness to palpation on abdominal exam, abdomen is not distended or tense.    -Continue tacrolimus 2 mg twice daily    Aortic stenosis status post AVR aortic valve replacement  Bioprosthetic aortic valve placed in 2023.  Not on anticoagulation.  Currently holding aspirin in setting of recent bleeding but need to restart soon when safe.  - Restart aspirin 325 mg daily     CAD status post CABG  Last echo in 2023 with left ejection fraction of 55 to 60% and functioning aortic heart valve.  -On telemetry   - Restart aspirin as above  - Restart metoprolol tartrate 25 mg twice daily    Chronic Problems  Hyperlipidemia-Continue prvastiatin 40mg  daily   Type 2 diabetes mellitus- Continue jardiance 10mg  daily   OSA-Continue nighttime CPAP   Hypertension- Restart metoprol tartrate 25mg  BID,   Hypothyroidism-Continuej levothyroxine 137.  MDD: Continue sertraline 50mg  daily     Checklist:  Diet: Regular Diet  DVT PPx: SCDs  Code Status: Full Code  Dispo: Patient appropriate for Inpatient based on expectation of ongoing need for hospitalization greater than two midnights based on severity of presentation/services including bloody diarrhea requiring IVF and daily labs.    Team Contact Information:   Primary Team: Internal Medicine (MEDU)  Primary Resident: Lalla Brothers, DO  Resident's Pager: [phone] (Gen MedU Intern - Alvester Morin)    Interval history:   NAEON. Patient feels great today. Wants to get out of bed.  PT/OT eval placed. Diarrhea only twice today. No other acute complaints today.     Objective:   Physical Exam:  Temp:  [36.4 ??C (97.5 ??F)-36.5 ??C (97.7 ??F)] 36.4 ??C (97.5 ??F)  Heart Rate:  [49-74] 58  Resp:  [18] 18  BP: (114-154)/(69-110) 142/96  FiO2 (%):  [21 %] 21 %  SpO2:  [99 %-100 %] 100 %    Gen: NAD, converses   Heart: RRR  Lungs: CTAB, no crackles or wheezes  Abdomen: Soft, NTND  Extremities: trace edema   Psych: Alert, oriented

## 2022-09-29 NOTE — Unmapped (Signed)
Pt admitted with colitis. No bleeding noted this shift. Vss. Blood sugar checked and covered with ssi. No complaints of pain. Pt remains on bed alarm due to recent fall at home. No falls this shift. Continue to monitor pt continues with solumedrol   Problem: Adult Inpatient Plan of Care  Goal: Plan of Care Review  Outcome: Progressing  Goal: Patient-Specific Goal (Individualized)  Outcome: Progressing  Flowsheets (Taken 09/29/2022 0327)  Patient/Family-Specific Goals (Include Timeframe): Pt will have no falls or injuries this admissin  Individualized Care Needs: door closed, monitor labs, vs, achs, fall precautions  Anxieties, Fears or Concerns: get off bed alarm  Goal: Absence of Hospital-Acquired Illness or Injury  Outcome: Progressing  Intervention: Identify and Manage Fall Risk  Recent Flowsheet Documentation  Taken 09/28/2022 2200 by Valeta Harms, RN  Safety Interventions:   bed alarm   environmental modification   fall reduction program maintained   lighting adjusted for tasks/safety   nonskid shoes/slippers when out of bed  Intervention: Prevent and Manage VTE (Venous Thromboembolism) Risk  Recent Flowsheet Documentation  Taken 09/29/2022 0203 by Valeta Harms, RN  Anti-Embolism Device Type: SCD, Knee  Anti-Embolism Intervention: Refused  Anti-Embolism Device Location: BLE  Taken 09/29/2022 0000 by Valeta Harms, RN  Anti-Embolism Device Type: SCD, Knee  Anti-Embolism Intervention: Refused  Anti-Embolism Device Location: BLE  Taken 09/28/2022 2200 by Valeta Harms, RN  Anti-Embolism Device Type: SCD, Knee  Anti-Embolism Intervention: Refused  Anti-Embolism Device Location: BLE  Goal: Optimal Comfort and Wellbeing  Outcome: Progressing  Goal: Readiness for Transition of Care  Outcome: Progressing  Goal: Rounds/Family Conference  Outcome: Progressing     Problem: Fall Injury Risk  Goal: Absence of Fall and Fall-Related Injury  Outcome: Progressing  Intervention: Promote Scientist, clinical (histocompatibility and immunogenetics) Documentation  Taken 09/28/2022 2200 by Valeta Harms, RN  Safety Interventions:   bed alarm   environmental modification   fall reduction program maintained   lighting adjusted for tasks/safety   nonskid shoes/slippers when out of bed

## 2022-09-29 NOTE — Unmapped (Signed)
PHYSICAL THERAPY  Evaluation (09/29/22 0821)          Patient Name:  Anthony Alvarado       Medical Record Number: [id number]   Date of Birth: 03-11-1960  Sex: Male        Post-Discharge Physical Therapy Recommendations:  PT Post Acute Discharge Recommendations: Skilled PT services indicated, 3x weekly, Architectural technologist Recommendation  PT DME Recommendations: None (owns [facility])        Treatment Diagnosis: Deconditioning and generalized weakness     Activity Tolerance: Tolerated treatment well     ASSESSMENT  Problem List: Gait deviation, Decreased mobility      Assessment : Anthony Alvarado is a 62 y.o. male that presents to physical therapy with a PMHx of cryptogenic cirrhosis s/p OLT 08/2018 who presented to [facility] as OSH with persistent hematochezia and findings concerning for cellcept mediated colitis vs new diagnosis of IBD. Pt is A&Ox4 and agreeable and motivated to participate in therapy session. Pt performed all transfers with supervision, 721ft of ambulation with minA to supervision with no AD, and stair negotiation with CGA for stability. Pt demos good strength and coordination throughout session and demos safety awareness through self-correction of slight LOB while ambulating while laughing without additional assistance. Pt remains limited d/t gait deviation of toeing out with slight instability and generalized deconditioning. Pt will continue to benefit from skilled physical therapy in the acute and post acute settings to address the stated deficits. Recommend 3x (community ambulator) with supervision as needed.      Today's Interventions: Balance activities, Endurance activities, Gait training, Patient/Family/Caregiver Education, Therapeutic activity, Therapeutic exercise  Today's Interventions: Bed mobility, transfers, standing balance/tolerance, ambulation, stair negotiation; Pt education on: PT role, POC, progressive mobility, activity pacing, safety with discharge, importance of caregiver support, benefits of life alert of experiencing falls, benefits of mobility/exercise                 Clinical Decision Making: Low        PLAN  Planned Frequency of Treatment: Plan of Care Initiated: 09/29/22  1-2x per day for: 2-3x week  Planned Treatment Duration: 10/12/22     Planned Interventions: Education (Patient/Family/Caregiver), Self-care / Home Management training, Therapeutic Exercise, Home exercise program, Therapeutic Activity, Neuromuscular re-education, Gait training     Goals:   Patient and Family Goals: Get back to walking for exercise and to go home     SHORT GOAL #1: Pt will perform all functional transfers with independence               Time Frame : 2 weeks  SHORT GOAL #2: Pt will ambulate 630ft with independence and no LOB              Time Frame : 2 weeks  SHORT GOAL #3: Pt will ascend/descend 5 stairs with independence and no rails                                                          Long Term Goal #1: Pt will safely ambulate >1000 feet for community mobility with no AD and independence  Time Frame: 2 months     Prognosis:  Good  Positive Indicators: PLOF, familial support, motivation to participate  Barriers to Discharge: Gait instability  SUBJECTIVE  Communication Preference: Verbal,        Patient reports: I should get back to walking for exercise; pt agreeable to PT        Prior Functional Status: Pt was living alone in a home with his daughter and ex-wife helping intermittently when needed. Pt states his daughter has a full time job but can stop in when needed and stays most nights. Pt was previously walking for exercise but for the last two months was restricted to being in the house and going back and forth from the chair to the bathroom. He states his bathroom use is unexpected and sporadic, therefore he is unable to leave the house. He occasionally walks around the house short distances when he is feeling good. He is ambulating without an AD but occasionally uses a SPC for his gravel driveway. He states he is able to drive but typically doesn't and is not working d/t being on disability. Pt used to be a Teaching laboratory technician.  Equipment available at home: Straight cane        Past Medical History:   Diagnosis Date    Aortic valve stenosis     Autoimmune cholangitis     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    Cholestatic cirrhosis (CMS-HCC)     Hepatic encephalopathy (CMS-HCC)     Hypertension     Other osteoporosis without current pathological fracture     Sleep apnea     Type 2 diabetes mellitus (CMS-HCC)             Social History     Tobacco Use    Smoking status: Never    Smokeless tobacco: Never   Substance Use Topics    Alcohol use: Not Currently       Past Surgical History:   Procedure Laterality Date    APPENDECTOMY      CHG US GUIDE, TISSUE ABLATION N/A 05/03/2018    Procedure: ULTRASOUND GUIDANCE FOR, AND MONITORING OF, PARENCHYMAL TISSUE ABLATION;  Surgeon: Particia Nearing, MD;  Location: MAIN OR [facility];  Service: Transplant    LIVER TRANSPLANTATION  09/16/2018    PR CATH PLACE/CORON ANGIO, IMG SUPER/INTERP,R&L HRT CATH, L HRT VENTRIC N/A 03/21/2018    Procedure: Left/Right Heart Catheterization;  Surgeon: Neal Dy, MD;  Location: [facility] CATH;  Service: Cardiology    PR COLONOSCOPY W/BIOPSY SINGLE/MULTIPLE N/A 09/09/2022    Procedure: COLONOSCOPY, FLEXIBLE, PROXIMAL TO SPLENIC FLEXURE; WITH BIOPSY, SINGLE OR MULTIPLE;  Surgeon: Jules Husbands, MD;  Location: [facility]URES [facility];  Service: Gastroenterology    PR DECORTICATION,PULMONARY,TOTAL Right 01/13/2019    Procedure: Decortic Pulm (Separt Proc); Tot;  Surgeon: Evert Kohl, MD;  Location: MAIN OR [facility];  Service: Thoracic    PR EGD FLEXIBLE FOREIGN BODY REMOVAL N/A 12/26/2018    Procedure: UGI ENDOSCOPY; W/REMOVAL FOREIGN BODY;  Surgeon: Vonda Antigua, MD;  [facility]: [facility];  Service: Gastroenterology    PR LAP,ABLAT 1+ LIVER TUMOR(S),RADIOFREQ N/A 05/03/2018    Procedure: LAPAROSCOPY, SURGICAL, ABLATION OF 1 OR MORE LIVER TUMOR(S); RADIOFREQUENCY;  Surgeon: Particia Nearing, MD;  Location: MAIN OR [place];  Service: Transplant    PR PERQ DRAINAGE PLEURA INSERT CATH W/IMAGING N/A 01/11/2019    Procedure: PLEURAL DRAINAGE, PERC, W INSERTION OF INDWELLING CATHETER; W IMAGING GUIDANCE;  Surgeon: Jerelyn Charles, MD;  Location: BRONCH PROCEDURE LAB [facility];  Service: Pulmonary    PR THORACENTESIS NEEDLE/CATH PLEURA W/IMAGING N/A 01/06/2019  Procedure: THORACENTESIS W/ IMAGING;  Surgeon: Cleora Fleet, MD;  Location: BRONCH PROCEDURE LAB [facility];  Service: Pulmonary    PR THORACOSCOPY SURG W/PLEURODESIS Right 01/13/2019    Procedure: THORACOSCOPY, SURGICAL; WITH PLEURODESIS (EG, MECHANICAL OR CHEMICAL);  Surgeon: Evert Kohl, MD;  Location: MAIN OR [facility];  Service: Thoracic    PR TRANSPLANT LIVER,ALLOTRANSPLANT Bilateral 09/16/2018    Procedure: LIVER ALLOTRANSPLANTATION; ORTHOTOPIC, PARTIAL OR WHOLE, FROM CADAVER OR LIVING DONOR, ANY AGE;  Surgeon: Florene Glen, MD;  Location: MAIN OR [place];  Service: Transplant    PR TRANSPLANT,PREP DONOR LIVER, WHOLE N/A 09/16/2018    Procedure: BACKBNCH STD PREP CAD DONOR WHOLE LIVER GFT PRIOR TNSPLNT,INC CHOLE,DISS/REM SURR TISSU WO TRISEG/LOBE SPLT;  Surgeon: Florene Glen, MD;  Location: MAIN OR [place];  Service: Transplant    SKIN BIOPSY               Family History   Problem Relation Age of Onset    Asthma Mother     Hearing loss Mother     COPD Father     Cancer Father     Autoimmune disease Sister         AIH    Heart disease Sister     Colon cancer Maternal Grandfather     Liver disease Sister     Thyroid disease Sister     Thyroid disease Sister     Melanoma Neg Hx     Basal cell carcinoma Neg Hx     Squamous cell carcinoma Neg Hx         Allergies: Watermelon flavor                  Objective Findings:  Precautions / Restrictions  Precautions: Non-applicable  Weight Bearing Status: Non-applicable  Required Braces or Orthoses: Non-applicable     Pain Comments: Pt denies pain  Medical Tests / Procedures: Imaging and labs reviewed  Equipment / Environment: Vascular access (PIV, TLC, Port-a-cath, PICC), Telemetry     Vitals/Orthostatics : Following ambulation: SpO2 100% RA, HR 64 bpm, RPE 3/10; no signs of fatigue or distress     Living Situation  Living Environment: House  Lives With: Alone (Pt living alone but daughter and ex-wife available to help when needed)  Home Living: Two level home, Full bath main level, Able to Live on main level with bedroom/bathroom, Stairs to enter without rails, Tub/shower unit, Walk-in shower  Number of Stairs to Enter (outside): 2  Caregiver Identified?: Yes (Ex-wife and daughter)  Caregiver Availability: Intermittent  Caregiver Ability: Extensive lifting      Cognition: WFL, Follows 2-step commands  Cognition comment: Pt able to appropriately engage in all conversation and follow all commands  Orientation: Oriented x4  Visual/Perception: Wears Glasses/Contacts (Reading glasses)  Hearing: No deficit identified     Skin Inspection: Intact where visualized     Upper Extremities  UE ROM: Right WFL, Left WFL  UE Strength: Right WFL, Left WFL  UE comment: 5/5 throughout    Lower Extremities  LE ROM: Right WFL, Left WFL  LE Strength: Right WFL, Left WFL  LE comment: 5/5 throughout          Coordination: WFL  Sensation: WFL  Motor/Sensory/Neuro Comments: Pt demos good coordination with ambulation and stair negotiation; pt denies any numbness/tingling    Static Sitting-Level of Assistance: Independent  Dynamic Sitting-Level of Assistance: Independent  Sitting Balance comments: Pt independently sat EOB x5 minutes with no UE support and no LOB  Static Standing-Level of Assistance: Supervision  Dynamic Standing - Level of Assistance: Supervision  Standing Balance comments: Pt stood x5 minutes following ambulation with supervision and no use of AD or LOB      Bed Mobility: Supine to Sit  Supine to Sit assistance level: Independent  Bed Mobility comments: Pt independnelty transferred supine -> sit     Transfers: Sit to Stand  Sit to Stand assistance level: Standby assist, set-up cues, supervision of patient - no hands on  Transfer comments: Pt transfered sit <-> stand with supervision for safety and no AD or LOB      Gait Level of Assistance: Minimal assist, patient does 75% or more, Standby assist, set-up cues, supervision of patient - no hands on  Gait Distance Ambulated (ft): 700 ft  Skilled Treatment Performed: Pt ambulated 718ft with no AD and minA to SBA; pt ambulates with excessive toe out gait pattern and demos one slight LOB posteriorly when laughing but able to independnelty recover and return to ambulation. Pt requires min cues for activity pacing.     Stairs: x6 steps with reciprocal pattern and no use of BHR and CGA; x2 large steps with skipping a step to simulate large steps at home with CGA and no use of BHR, cues for safety with descent, no LOB            Endurance: Pt demos good endurance with reported RPE of 3/10 following 717ft of ambulation and stair negotiation    Patient at end of session: All needs in reach, In chair, Lines intact, Nurse notified    Physical Therapy Session Duration  PT Individual [mins]: 25     Medical Staff Made Aware: RN Amy    AM-PAC-6 click  Help currently need turning over In bed?: None - Modified Independent/Independent  Help currently needed sitting down/standing up from chair with arms? : A Little - Minimal/Contact Guard Assist/Supervision  Help currently needed moving from supine to sitting on edge of bed?: None - Modified Independent/Independent  Help currently needed moving to and from bed from wheelchair?: A Little - Minimal/Contact Guard Assist/Supervision  Help currently needed walking in a hospital room?: A Little - Minimal/Contact Guard Assist/Supervision  Help currently needed climbing 3-5 steps with railing?: A Little - Minimal/Contact Guard Assist/Supervision    Basic Mobility Score 6 click: 20    6 click Score (in points): % of Functional Impairment, Limitation, Restriction  6: 100% impaired, limited, restricted  7-8: At least 80%, but less than 100% impaired, limited restricted  9-13: At least 60%, but less than 80% impaired, limited restricted  14-19: At least 40%, but less than 60% impaired, limited restricted  20-22: At least 20%, but less than 40% impaired, limited restricted  23: At least 1%, but less than 20% impaired, limited restricted  24: 0% impaired, limited restricted    I attest that I have reviewed the above information.  Signed: Viviana Simpler, PT  Filed 09/29/2022     The care for this patient was completed by Viviana Simpler, PT:  A student was present and participated in the care. Licensed/Credentialed therapist was physically present and immediately available to direct and supervise tasks that were related to patient management. The direction and supervision was continuous throughout the time these tasks were performed.    Viviana Simpler, PT

## 2022-09-29 NOTE — Unmapped (Addendum)
Adult Nutrition Assessment Note    Visit Type: RN Consult  Reason for Visit: Have you gained or lost 10 pounds in the past 3 months?, Per Admission Nutrition Screen (Adult), Have you had a decrease in food intake or appetite?    HPI & PMH:  Per provider, 62 y.o. 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     Patient Active Problem List   Diagnosis    Diabetes mellitus without complication (CMS-HCC)    Hypothyroidism    Cirrhosis of liver without ascites (CMS-HCC)    OSA (obstructive sleep apnea)    Essential hypertension    Encounter for pre-transplant evaluation for chronic liver disease    Pleural effusion    Pneumothorax after biopsy    Liver transplant recipient (CMS-HCC)    Allergic rhinitis    Arthralgia    Asthma    Bicuspid aortic valve    Flying phobia    Mucosal abnormality of stomach    Nephrolithiasis    Pure hypercholesterolemia    Umbilical hernia    Unspecified hearing loss    COVID-19    Colitis         Anthropometric Data:  Height: 175.3 cm (5' 9)   Admission weight: 74.8 kg (164 lb 14.5 oz)  Last recorded weight: 74.8 kg (164 lb 14.5 oz)  IBW: 72.64 kg  Percent IBW: 102.97 %  BMI: Body mass index is 24.35 kg/m??.   Usual Body Weight:  ~ 200 lbs     Weight history prior to admission:  Documented weight history reviewed. Current body weight of 164 lbs indicate a ~ 10 lb weight gain in ~ 10 days, as well as a 20% weight loss x 6 months, which is clinically significant. Pt endorsed previous intentional weight loss from usual body weight of 200 lbs after starting ozempic (stated for both DM and weight loss). Pt stated he wanted to lose weight to ~ 170-175 lbs but noticed that he kept losing weight. Pt also endorsed having a poor appetite over the past few weeks that has resulted in unintentional weight loss after previous intentional weight loss. Stated appetite has improved since starting steroids.     Wt Readings from Last 10 Encounters:   09/27/22 74.8 kg (164 lb 14.5 oz)   09/15/22 69.9 kg (154 lb)   05/12/22 93.2 kg (205 lb 6.4 oz)   04/06/22 93.9 kg (207 lb)   08/28/21 92.4 kg (203 lb 9.6 oz)   09/17/20 95.8 kg (211 lb 3.2 oz)   03/20/20 93.4 kg (206 lb)   09/18/19 93.7 kg (206 lb 8 oz)   09/18/19 93.7 kg (206 lb 8 oz)   06/19/19 90.3 kg (199 lb 1.6 oz)        Weight changes this admission:   Last 5 Recorded Weights    09/27/22 0005   Weight: 74.8 kg (164 lb 14.5 oz)        Nutrition Focused Physical Exam:  Nutrition Focused Physical Exam:  Fat Areas Examined  Orbital: Moderate loss  Upper Arm: Moderate loss      Muscle Areas Examined  Temple: Moderate loss  Clavicle: Moderate loss  Acromion: Moderate loss  Dorsal Hand: Mild loss  Nutrition Evaluation  Overall Impressions: Moderate fat loss, Moderate muscle loss (09/29/22 1146)  Nutrition Designation: Normal weight (BMI 18.50 - 24.99 kg/m2) (09/29/22 1146)      NUTRITIONALLY RELEVANT DATA     Medications:   Nutritionally pertinent medications reviewed and evaluated for potential food and/or medication interactions and include insulin, synthroid, solu medrol, pravachol, bactrim, prograf    Labs:   Nutritionally pertinent labs reviewed and include K+: 5.2 mmol/L and Glucose: 150 mg/dL  POC glucose x 24 hrs: 132-338 mg/dL  [id number] (04/54/09): 8.1%     Nutrition History:   September 29, 2022: Prior to admission:  Spoke with pt at bedside this morning who was observed to have consumed ~ 75% of  breakfast this morning (eggs, bacon, breakfast potatoes). Pt stated his appetite had been poor for a few weeks prior to admission. However, pt stated that appetite has been improving significantly with steroids ordered. Also reported having diarrhea for months that has also been improving. Pt endorsed he only had 2 BM yesterday and one this morning prior to RD assessment. Denied any n/v, abdominal pain, or chewing/swallowing difficulty. Stated he does often consume and ensure daily at home to supplement intakes, stating goal to preserve lean body mass.     Allergies, Intolerances, Sensitivities, and/or Cultural/Religious Dietary Restrictions: include watermelon       Current Nutrition:  Oral intake     Nutrition Orders            Nutrition Therapy Regular/House starting at 08/04 0053            Nutritional Needs:   Healthy balance of carbohydrate, protein, and fat.       Malnutrition Assessment using AND/ASPEN or GLIM Clinical Characteristics:    Non-severe (Moderate) Protein-Calorie Malnutrition in the context of chronic illness (09/29/22 1147)  Interpretation of Wt. Loss: > or equal to 10% x 6 month (20% x 5 months)  Fat Loss: Moderate  Muscle Loss: Moderate  Malnutrition Score: 3            GOALS and EVALUATION     Patient to meet 75% or greater of nutritional needs via combination of meals, snacks, and/or oral supplements within admission.  - New    Motivation, Barriers, and Compliance:  Evaluation of motivation, barriers, and compliance pending at this time due to clinical status.     NUTRITION ASSESSMENT     Current nutrition therapy is appropriate and progressing toward meeting meeting nutritional needs at this time.   Patient would benefit from start of oral supplement to better meet nutritional needs.  Consumes ensure at home, agreeable to glucerna to supplement intakes while having hyperglycemia   Diarrhea x months prior to admission, endorsing improvement with only 2 BM 08/05  Hyperglycemia with steroids ordered & T2DM, recent A1C 7.4%  May benefit from daily multivitamin in the setting of malnutrition       Discharge Planning:   Monitor for potential discharge needs with multi-disciplinary team.     Was the nutrition care plan completed? Yes       NUTRITION INTERVENTIONS and RECOMMENDATION     Continue on the least restrictive diet to encourage po intakes at this time   Documentation of PO intakes   Recommend glucerna daily to supplement intakes   Recommend daily multivitamin   Medical management of diarrhea   Weekly weights     Follow-Up Parameters:   1-2 times per week (and more frequent as indicated)    Prince Solian, MS,  RD, LDN  [phone]

## 2022-09-29 NOTE — Unmapped (Signed)
Patient alert and oriented x 4 today.  Vitals stable.  No calls from telemetry.  Denies pain.  Patient has been up ad lib and ambulating some in halls as well.  He is steady on his feet, worked with physical therapy as well.  Patient switched over to oral prednisone today.  Appetite has been good.  Patient pleasant and cooperative.  Will monitor.       Problem: Adult Inpatient Plan of Care  Goal: Plan of Care Review  Outcome: Progressing  Goal: Patient-Specific Goal (Individualized)  Outcome: Progressing  Flowsheets (Taken 09/29/2022 1639)  Patient/Family-Specific Goals (Include Timeframe): Patient will remain hemodynamically stable during shift  Individualized Care Needs: monitor vs/labs, ACHS, tele, continuous pulse ox, oral prednisone  Anxieties, Fears or Concerns: none voiced  Goal: Absence of Hospital-Acquired Illness or Injury  Outcome: Progressing  Intervention: Identify and Manage Fall Risk  Recent Flowsheet Documentation  Taken 09/29/2022 0800 by Deadra Diggins K, RN  Safety Interventions:   fall reduction program maintained   low bed   nonskid shoes/slippers when out of bed  Intervention: Prevent and Manage VTE (Venous Thromboembolism) Risk  Recent Flowsheet Documentation  Taken 09/29/2022 1100 by Antaniya Venuti K, RN  VTE Prevention/Management:   anticoagulant therapy   ambulation promoted  Intervention: Prevent Infection  Recent Flowsheet Documentation  Taken 09/29/2022 0800 by Nathon Stefanski K, RN  Infection Prevention:   environmental surveillance performed   hand hygiene promoted   personal protective equipment utilized   rest/sleep promoted   single patient room provided  Goal: Optimal Comfort and Wellbeing  Outcome: Progressing  Intervention: Provide Person-Centered Care  Recent Flowsheet Documentation  Taken 09/29/2022 1100 by Rossi Silvestro K, RN  Trust Relationship/Rapport:   care explained   questions encouraged   reassurance provided  Goal: Readiness for Transition of Care  Outcome: Progressing  Goal: Rounds/Family Conference  Outcome: Progressing

## 2022-09-29 NOTE — Unmapped (Signed)
Tacrolimus Therapeutic Monitoring Pharmacy Note  Anthony Alvarado is a 63 y.o. male continuing tacrolimus.     Indication: Liver transplant     Date of Transplant:  08/2018       Prior Dosing Information: Home regimen 2 mg BID      See Below for Current Inpatient Dosing and Levels    Source(s) of information used to determine prior to admission dosing: Home Medication List, Clinic Note, or Fill HIstory    Goals:  Therapeutic Drug Levels  Tacrolimus trough goal:  4-6 ng/mL  per 8/4 consult note    Additional Clinical Monitoring/Outcomes  Monitor renal function (SCr and urine output) and liver function (LFTs)  Monitor for signs/symptoms of adverse events (e.g., hyperglycemia, hyperkalemia, hypomagnesemia, hypertension, headache, tremor)    Results:     Creatinine   Date Value Ref Range Status   09/29/2022 0.88 0.73 - 1.18 mg/dL Final   16/11/9602 5.40 0.73 - 1.18 mg/dL Final   98/12/9145 8.29 0.73 - 1.18 mg/dL Final        Pharmacokinetic Considerations and Significant Drug Interactions:  Concurrent hepatotoxic medications:  pravastatin (hm)    Concurrent CYP3A4 substrates/inhibitors: None identified at the time of this note  Concurrent nephrotoxic medications:  Bactrim, Jardiance (hm)      Tacrolimus Dosing  Tacro 2mg  AM and 2 mg PM (8/4-8/6a)  Tacro 2mg  AM and 3 mg PM (8/6p......)    Tacrolimus Level  8/5 = 2.2 ng/mL at 1000 ~ 13.5h after last dose (8/4 2mg  at ~2030)  8/6 = 2.3 ng/mL at 0823 ~12 hours after last dose (8/5 2 mg at 2046)    Assessment/Plan:  8/5 Scr 0.92, 8/6 Scr 0.88  Tacro level remains below goal 8/5 and 8/6. Trough was drawn appropriately.     Recommendation(s) unless otherwise indicated by TRANSPLANT TEAM  INCREASE evening dose to 3 mg, and continue 2 mg morning dose  Use 8/7 level to adjust dose as needed    Follow-up  Tacrolimus Level:  DAILY between 0600-0800 PRIOR to morning dose.  Dose due 0800  A pharmacist will continue to monitor and recommend levels as appropriate    Longitudinal Dose Monitoring:  Date Dose (mg), Route AM Scr (mg/dL) Level  (ng/mL) Key Drug Interactions   10/01/22       09/30/22       09/29/22 2 mg AM   3mg  PM PO 0.88 2.3    09/28/22 2 mg AM  2mg  PM PO 0.92 2.2      Please page service pharmacist with questions/clarifications.    Anthony Alvarado  PharmD Candidate PY4 Class of 2025   Anthony Alvarado, PharmD  Clinical Specialist - Internal Medicine  [facility], [facility] APP Team Pharmacist  September 29, 2022 8:32 PM

## 2022-09-29 NOTE — Unmapped (Signed)
Internal Medicine (MEDU) Progress Note    Assessment & Plan:   62 y.o. 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    Principal Problem:    Colitis  Active Problems:    Liver transplant recipient (CMS-HCC)    Diabetes mellitus without complication (CMS-HCC)    Hypothyroidism    OSA (obstructive sleep apnea)    Essential hypertension  Resolved Problems:    * No resolved hospital problems. *    Active Problems    Recurrent colitis presumably due to CellCept versus IBD   Persistent diarrhea - improved  Acute blood loss anemia   Diarrhea initially started 05/2022. He had colonoscopy on 7/17 which showed severe mucosal changes throughout the colon and pathology showed active colitis with ulceration with differential including infectious etiology versus drug induced versus inflammatory bowel disease.  Flex sig at OSH w/ similar findings and CMV-. Most likely IBD, though cannot r/o mycophenolate associated colitis. Improving after infliximab 8/3. Patient reports no blood in stools and overall feeling better.   - GI consulted, following    - s/p infliximab high dose 8/3 with symptomatic improvement.   - Switched to prednisone 40 mg PO on 8/6.   - follow up Quantiferon gold, HCV antibody, HIV screen, TPMT and NUDT15    genotype, HepB serologies: surface antigen, surface antibody, total core antibody   - Stool diary  - obtain daily tac trough  - cont holding cellcept   - Will follow up with outpatient GI  -- Monitor H&H   -- Consider iron replacement   - Continue Bactrim PJP prophylaxis while on chronic steroids  -- Restarting aspirin and DVT ppx    Bradycardia   Patient had episode of bradycardia to 49 on 8/5. Asymptomatic. Patient reports usually having HR in 60's. Reported side effect from Infliximab.   - EKG showed sinus Huston Foley.   - Continue to monitor.     Cryptogenic cirrhosis status post liver transplant recipient (2020)  Mycophenolate recently discontinued 7/27 due to concern for mycophenolate induced colitis.  Now only on tacrolimus 2 mg twice daily.  No tenderness to palpation on abdominal exam, abdomen is not distended or tense.    -Continue tacrolimus 2 mg twice daily  - Tac trough daily     Aortic stenosis status post AVR aortic valve replacement  Bioprosthetic aortic valve placed in 2023.  Not on anticoagulation.  Currently holding aspirin in setting of recent bleeding but need to restart soon when safe.  - Restart aspirin 325 mg daily     CAD status post CABG  Last echo in 2023 with left ejection fraction of 55 to 60% and functioning aortic heart valve.  - On telemetry   - Restart aspirin as above  - Restart metoprolol tartrate 25 mg twice daily    Chronic Problems  Hyperlipidemia-Continue prvastiatin 40mg  daily   Type 2 diabetes mellitus- Continue jardiance 10mg  daily   OSA-Continue nighttime CPAP   Hypertension- Restart metoprol tartrate 25mg  BID,   Hypothyroidism-Continuej levothyroxine 137.  MDD: Continue sertraline 50mg  daily     Checklist:  Diet: Regular Diet  DVT PPx: SCDs  Code Status: Full Code  Dispo: Patient appropriate for Inpatient based on expectation of ongoing need for hospitalization greater than two midnights based on  severity of presentation/services including bloody diarrhea requiring IVF and daily labs.    Team Contact Information:   Primary Team: Internal Medicine (MEDU)  Primary Resident: Meryl Crutch, DO  Resident's Pager: [phone] (Gen [facility] Intern - Alvester Morin)    Interval history:   NAEON. Patient seen sitting in his chair. Patient states that he is feeling well. He is able to get up and move around. He denies any blood in his stools, and understands the plan to switch to PO steroids.     Objective: Physical Exam:  Temp:  [36.4 ??C (97.6 ??F)-37 ??C (98.6 ??F)] 36.6 ??C (97.9 ??F)  Heart Rate:  [49-67] 64  Resp:  [16-19] 19  BP: (111-153)/(75-98) 111/75  FiO2 (%):  [21 %] 21 %  SpO2:  [94 %-100 %] 100 %    Gen: NAD, converses   Heart: RRR  Lungs: Comfortably breathing on RA  Abdomen: Non-distended  Extremities: trace edema   Psych: Alert, oriented    Teddy Spike, DO PGY1

## 2022-09-30 LAB — RENAL FUNCTION PANEL
ALBUMIN: 2.8 g/dL — ABNORMAL LOW (ref 3.4–5.0)
ANION GAP: 11 mmol/L (ref 5–14)
BLOOD UREA NITROGEN: 19 mg/dL (ref 9–23)
BUN / CREAT RATIO: 18
CALCIUM: 8.9 mg/dL (ref 8.7–10.4)
CHLORIDE: 105 mmol/L (ref 98–107)
CO2: 22 mmol/L (ref 20.0–31.0)
CREATININE: 1.03 mg/dL
EGFR CKD-EPI (2021) MALE: 83 mL/min/{1.73_m2} (ref >=60)
GLUCOSE RANDOM: 121 mg/dL (ref 70–179)
PHOSPHORUS: 3.1 mg/dL (ref 2.4–5.1)
POTASSIUM: 4.6 mmol/L (ref 3.4–4.8)
SODIUM: 138 mmol/L (ref 135–145)

## 2022-09-30 LAB — C-REACTIVE PROTEIN: C-REACTIVE PROTEIN: 14 mg/L — ABNORMAL HIGH (ref <=10.0)

## 2022-09-30 LAB — HIV ANTIGEN/ANTIBODY COMBO: HIV ANTIGEN/ANTIBODY COMBO: NONREACTIVE

## 2022-09-30 LAB — TB MITOGEN: TB MITOGEN VALUE: 0.08

## 2022-09-30 LAB — HEPATITIS C ANTIBODY: HEPATITIS C ANTIBODY: NONREACTIVE

## 2022-09-30 LAB — TACROLIMUS LEVEL, TIMED: TACROLIMUS BLOOD: 4.2 ng/mL

## 2022-09-30 LAB — POTASSIUM: POTASSIUM: 4.6 mmol/L (ref 3.4–4.8)

## 2022-09-30 LAB — MAGNESIUM: MAGNESIUM: 1.9 mg/dL (ref 1.6–2.6)

## 2022-09-30 LAB — VITAMIN D 25 HYDROXY: VITAMIN D, TOTAL (25OH): 30.6 ng/mL (ref 20.0–80.0)

## 2022-09-30 LAB — QUANTIFERON TB GOLD PLUS
QUANTIFERON ANTIGEN 1 MINUS NIL: 0 [IU]/mL
QUANTIFERON ANTIGEN 2 MINUS NIL: 0 [IU]/mL
QUANTIFERON MITOGEN: 0.07 [IU]/mL
QUANTIFERON TB GOLD PLUS: UNDETERMINED — AB
QUANTIFERON TB NIL VALUE: 0.01 [IU]/mL

## 2022-09-30 LAB — HEPATITIS B CORE ANTIBODY, TOTAL: HEPATITIS B CORE TOTAL ANTIBODY: NONREACTIVE

## 2022-09-30 LAB — HEPATITIS B SURFACE ANTIGEN: HEPATITIS B SURFACE ANTIGEN: NONREACTIVE

## 2022-09-30 LAB — TB AG2: TB AG2 VALUE: 0.01

## 2022-09-30 LAB — TB NIL: TB NIL VALUE: 0.01

## 2022-09-30 LAB — HEPATITIS B SURFACE ANTIBODY
HEPATITIS B SURFACE ANTIBODY QUANT: <8 m[IU]/mL (ref <8.00)
HEPATITIS B SURFACE ANTIBODY: NONREACTIVE

## 2022-09-30 LAB — TB AG1: TB AG1 VALUE: 0.01

## 2022-09-30 MED ORDER — SULFAMETHOXAZOLE 800 MG-TRIMETHOPRIM 160 MG TABLET
ORAL_TABLET | Freq: Every day | ORAL | 0 refills | 30.00000 days
Start: 2022-09-30 — End: ?

## 2022-09-30 MED ORDER — TACROLIMUS 1 MG CAPSULE, IMMEDIATE-RELEASE
ORAL_CAPSULE | Freq: Every evening | ORAL | 11 refills | 30.00000 days
Start: 2022-09-30 — End: 2023-09-30

## 2022-09-30 MED ADMIN — metoPROLOL tartrate (Lopressor) tablet 25 mg: 25 mg | ORAL

## 2022-09-30 MED ADMIN — insulin lispro (HumaLOG) injection 0-20 Units: 0-20 [IU] | SUBCUTANEOUS | @ 21:00:00

## 2022-09-30 MED ADMIN — sodium ferric gluconate (FERRLECIT) 250 mg in sodium chloride (NS) 0.9 % 100 mL IVPB: 250 mg | INTRAVENOUS | @ 17:00:00 | Stop: 2022-10-02

## 2022-09-30 MED ADMIN — tacrolimus (PROGRAF) capsule 2 mg: 2 mg | ORAL | @ 12:00:00

## 2022-09-30 MED ADMIN — insulin lispro (HumaLOG) injection 0-20 Units: 0-20 [IU] | SUBCUTANEOUS | @ 04:00:00

## 2022-09-30 MED ADMIN — levothyroxine (SYNTHROID) tablet 175 mcg: 175 ug | ORAL | @ 12:00:00

## 2022-09-30 MED ADMIN — pravastatin (PRAVACHOL) tablet 40 mg: 40 mg | ORAL | @ 12:00:00

## 2022-09-30 MED ADMIN — tacrolimus (PROGRAF) capsule 3 mg: 3 mg | ORAL

## 2022-09-30 MED ADMIN — insulin lispro (HumaLOG) injection 0-20 Units: 0-20 [IU] | SUBCUTANEOUS | @ 16:00:00

## 2022-09-30 MED ADMIN — predniSONE (DELTASONE) tablet 40 mg: 40 mg | ORAL | @ 12:00:00

## 2022-09-30 MED ADMIN — aspirin tablet 325 mg: 325 mg | ORAL | @ 12:00:00

## 2022-09-30 MED ADMIN — metoPROLOL tartrate (Lopressor) tablet 25 mg: 25 mg | ORAL | @ 12:00:00

## 2022-09-30 MED ADMIN — sertraline (ZOLOFT) tablet 50 mg: 50 mg | ORAL | @ 12:00:00

## 2022-09-30 MED ADMIN — empagliflozin (JARDIANCE) tablet 10 mg: 10 mg | ORAL | @ 12:00:00

## 2022-09-30 NOTE — Unmapped (Signed)
Patient alert and oriented x 4 today.  Vitals stable.  Denies pain.  Patient received iron infusion.  Appetite good.  Pleasant and cooperative.  No other concerns voiced.  Will monitor.       Problem: Adult Inpatient Plan of Care  Goal: Plan of Care Review  Outcome: Progressing  Goal: Patient-Specific Goal (Individualized)  Outcome: Progressing  Flowsheets (Taken 09/30/2022 1648)  Patient/Family-Specific Goals (Include Timeframe): Pt will remain hemodynamically stable this shift  Individualized Care Needs: monitor labs vs, achs, telemetry continous pulse ox, cpap, oral prednisone  Anxieties, Fears or Concerns: none voiced  Goal: Absence of Hospital-Acquired Illness or Injury  Outcome: Progressing  Intervention: Identify and Manage Fall Risk  Recent Flowsheet Documentation  Taken 09/30/2022 0800 by Tamme Mozingo K, RN  Safety Interventions:   fall reduction program maintained   low bed   nonskid shoes/slippers when out of bed  Intervention: Prevent Skin Injury  Recent Flowsheet Documentation  Taken 09/30/2022 0800 by Kamiryn Bezanson K, RN  Skin Protection: adhesive use limited  Intervention: Prevent and Manage VTE (Venous Thromboembolism) Risk  Recent Flowsheet Documentation  Taken 09/30/2022 0800 by Anagabriela Jokerst K, RN  VTE Prevention/Management: anticoagulant therapy  Intervention: Prevent Infection  Recent Flowsheet Documentation  Taken 09/30/2022 0800 by Aleene Swanner K, RN  Infection Prevention:   environmental surveillance performed   hand hygiene promoted   personal protective equipment utilized   rest/sleep promoted   single patient room provided  Goal: Optimal Comfort and Wellbeing  Outcome: Progressing  Intervention: Provide Person-Centered Care  Recent Flowsheet Documentation  Taken 09/30/2022 1000 by Derreck Wiltsey K, RN  Trust Relationship/Rapport:   care explained   questions encouraged   reassurance provided  Goal: Readiness for Transition of Care  Outcome: Progressing  Goal: Rounds/Family Conference  Outcome: Progressing

## 2022-09-30 NOTE — Unmapped (Signed)
GI Consults Progress Note    Assessment  Anthony Alvarado is a 62 y.o. man with history of cryptogenic cirrhosis status post liver transplant and managed on tacrolimus and mycophenolate who developed hematochezia and diarrhea and found to have a new diagnosis of colitis on 7/17 colonoscopy. Endoscopically, the colitis appeared more consistent with drug-induced colitis rather than inflammatory bowel disease, and pathology demonstrated acute (but not chronic) colitis.  He did not respond to several days of oral steroids and was admitted, where he was treated with IV steroids and infliximab, with eventual improvement.    Overall, these findings are consistent with drug-induced colitis, likely from mycophenolate, rather than inflammatory bowel disease.  I anticipate ongoing improvement with slow steroid taper and permanent cessation of mycophenolate.    Recommendations  Permanently discontinue mycophenolate  Continue prednisone taper  Outpatient GI follow-up.    Thank you for this consult, GI service will sign off. Please page on-call fellow with questions.    Marylou Mccoy, MD MS  [facility]    ---------------------------------------------------------------------  Subjective  No acute events overnight.  Diarrhea continues to improve despite transition to oral steroids.    Objective  Vitals:    09/30/22 0753   BP: 138/92   Pulse: 58   Resp: 18   Temp: 36.3 ??C (97.3 ??F)   SpO2: 99%     Gen: Well-appearing, no acute distress  Eyes: no scleral icterus.  HENT:  No temporal wasting    Results  Labs and imaging reviewed.

## 2022-09-30 NOTE — Unmapped (Addendum)
Pt admitted with colitis. Pt is alert oriented x4, vss. Blood sugar covered and md notified that blood sugar was over 300. Pt resting thru night with cpap. No falls this shift.     6am  1 stool with very little Bright red blood on paper and on stool.    Problem: Adult Inpatient Plan of Care  Goal: Plan of Care Review  Outcome: Progressing  Goal: Patient-Specific Goal (Individualized)  Outcome: Progressing  Flowsheets (Taken 09/30/2022 0502)  Patient/Family-Specific Goals (Include Timeframe): Pt will remain hemodynamically stable this shift  Individualized Care Needs: monitor labs  vs, achs, telemetry continous pulse ox, cpap, oral prednisone  Anxieties, Fears or Concerns: none voiced  Goal: Absence of Hospital-Acquired Illness or Injury  Outcome: Progressing  Intervention: Identify and Manage Fall Risk  Recent Flowsheet Documentation  Taken 09/29/2022 2000 by Valeta Harms, RN  Safety Interventions:   commode/urinal/bedpan at bedside   fall reduction program maintained   environmental modification   nonskid shoes/slippers when out of bed  Intervention: Prevent and Manage VTE (Venous Thromboembolism) Risk  Recent Flowsheet Documentation  Taken 09/30/2022 0415 by Valeta Harms, RN  Anti-Embolism Device Type: SCD, Knee  Anti-Embolism Intervention: Refused  Anti-Embolism Device Location: BLE  Taken 09/30/2022 0230 by Valeta Harms, RN  Anti-Embolism Device Type: SCD, Knee  Anti-Embolism Intervention: Refused  Anti-Embolism Device Location: BLE  Taken 09/30/2022 0000 by Valeta Harms, RN  Anti-Embolism Device Type: SCD, Knee  Anti-Embolism Intervention: Refused  Anti-Embolism Device Location: BLE  Taken 09/29/2022 2200 by Valeta Harms, RN  Anti-Embolism Device Type: SCD, Knee  Anti-Embolism Intervention: Refused  Anti-Embolism Device Location: BLE  Taken 09/29/2022 2000 by Valeta Harms, RN  Anti-Embolism Device Type: SCD, Knee  Anti-Embolism Intervention: Refused  Anti-Embolism Device Location: BLE  Goal: Optimal Comfort and Wellbeing  Outcome: Progressing  Goal: Readiness for Transition of Care  Outcome: Progressing  Goal: Rounds/Family Conference  Outcome: Progressing     Problem: Fall Injury Risk  Goal: Absence of Fall and Fall-Related Injury  Outcome: Progressing  Intervention: Promote Injury-Free Environment  Recent Flowsheet Documentation  Taken 09/29/2022 2000 by Valeta Harms, RN  Safety Interventions:   commode/urinal/bedpan at bedside   fall reduction program maintained   environmental modification   nonskid shoes/slippers when out of bed

## 2022-09-30 NOTE — Unmapped (Signed)
Internal Medicine (MEDU) Progress Note    Assessment & Plan:   62 y.o. 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    Principal Problem:    Colitis  Active Problems:    Liver transplant recipient (CMS-HCC)    Diabetes mellitus without complication (CMS-HCC)    Hypothyroidism    OSA (obstructive sleep apnea)    Essential hypertension  Resolved Problems:    * No resolved hospital problems. *    Active Problems    Recurrent colitis presumably due to CellCept versus IBD   Persistent diarrhea - improved  Acute blood loss anemia   Diarrhea initially started 05/2022. He had colonoscopy on 7/17 which showed severe mucosal changes throughout the colon and pathology showed active colitis with ulceration with differential including infectious etiology versus drug induced versus inflammatory bowel disease.  Flex sig at OSH w/ similar findings and CMV-. Most likely IBD, though cannot r/o mycophenolate associated colitis. Improving after infliximab 8/3. Patient reports no blood in stools and overall feeling better.   - GI consulted, following    - s/p infliximab high dose 8/3 with symptomatic improvement.   - Switched to prednisone 40 mg PO on 8/6. Will monitor one full day on oral steroids and if tolerated ready for discharge. Continue o/p until GI follow up.   - follow up Quantiferon gold, HCV antibody, HIV screen, TPMT and NUDT15    genotype, HepB serologies: surface antigen, surface antibody, total core antibody   - Stool diary  - obtain daily tac trough  - Cellcept permanently discontinued per GI  -- Follow up on CBC on 8/8 prior to discharge  -- IV iron started 8/7  -- Continue aspirin and DVT ppx  - Replete magnesium as needed. Hyperkalemia  5.1. Likely secondary to bactrim (PJP prophylaxis). Holding bactrim. Reevaluating potassium. Restart Bactrim if Potassium normalizes. If continues to be hyperkalemic consider switching to Dapsone 100mg  for prophylaxis.     Bradycardia   Patient had episode of bradycardia to 49 on 8/5. Asymptomatic. Patient reports usually having HR in 60's. Reported side effect from Infliximab.   - EKG showed sinus Huston Foley.   - Continue to monitor.     Type 2 diabetes mellitus-   POCT glucose checks have been elevated into the 300's throughout his hospitalization requiring sliding scale insulin. Consider PCP follow up to discuss starting GLP-1 antagonist for further diabetes management.   Continue jardiance 10mg  daily.     Cryptogenic cirrhosis status post liver transplant recipient (2020)  Mycophenolate recently discontinued 7/27 due to concern for mycophenolate induced colitis. No tenderness to palpation on abdominal exam, abdomen is not distended or tense.Tacrolimus trough subtherapeutic on 8/6.   - Increased dose to 2mg  AM and 3mg  PM  - Tac trough daily     Aortic stenosis status post AVR aortic valve replacement  Bioprosthetic aortic valve placed in 2023.  Not on anticoagulation.  Currently holding aspirin in setting of recent bleeding but need to restart soon when safe.  - Restart aspirin 325 mg daily     CAD status post CABG  Last echo in 2023 with left ejection fraction of 55 to 60% and functioning aortic heart valve.  - On telemetry   - Restart aspirin as above  - Restart metoprolol  tartrate 25 mg twice daily    Chronic Problems  Hyperlipidemia-Continue prvastiatin 40mg  daily   OSA-Continue nighttime CPAP   Hypertension- Restart metoprol tartrate 25mg  BID,   Hypothyroidism-Continuej levothyroxine 137.  MDD: Continue sertraline 50mg  daily       Malnutrition Assessment using AND/ASPEN or GLIM Clinical Characteristics:   Non-severe (Moderate) Protein-Calorie Malnutrition in the context of chronic illness (09/29/22 1147)  Interpretation of Wt. Loss: > or equal to 10% x 6 month (20% x 5 months)  Fat Loss: Moderate  Muscle Loss: Moderate  Malnutrition Score: 3    Checklist:  Diet: Regular Diet  DVT PPx: SCDs  Code Status: Full Code  Dispo: Patient appropriate for Inpatient based on expectation of ongoing need for hospitalization greater than two midnights based on severity of presentation/services including bloody diarrhea requiring IVF and daily labs.    Team Contact Information:   Primary Team: Internal Medicine (MEDU)  Primary Resident: Meryl Crutch, DO  Resident's Pager: [phone] (Gen Med[facility] Intern - Alvester Morin)    Interval history:   Patient reports a small amount of blood when wiping last night. Otherwise, he denies any blood in his stool. He states he is feeling much better and moving around easily.     Objective:   Physical Exam:  Temp:  [36.4 ??C (97.5 ??F)-37 ??C (98.6 ??F)] 36.4 ??C (97.5 ??F)  Heart Rate:  [52-67] 55  Resp:  [17-19] 17  BP: (107-156)/(68-98) 133/78  FiO2 (%):  [21 %] 21 %  SpO2:  [97 %-100 %] 99 %    Gen: NAD, converses clearly.   Heart: RRR  Lungs: Comfortably breathing on RA  Abdomen: Non-distended  Extremities: trace edema   Psych: Alert, oriented    Teddy Spike, DO PGY1

## 2022-10-01 LAB — CBC W/ AUTO DIFF
BASOPHILS ABSOLUTE COUNT: 0.1 10*9/L (ref 0.0–0.1)
BASOPHILS RELATIVE PERCENT: 0.5 %
EOSINOPHILS ABSOLUTE COUNT: 0.1 10*9/L (ref 0.0–0.5)
EOSINOPHILS RELATIVE PERCENT: 1.2 %
HEMATOCRIT: 27.2 % — ABNORMAL LOW (ref 39.0–48.0)
HEMOGLOBIN: 8.9 g/dL — ABNORMAL LOW (ref 12.9–16.5)
LYMPHOCYTES ABSOLUTE COUNT: 3 10*9/L (ref 1.1–3.6)
LYMPHOCYTES RELATIVE PERCENT: 28.6 %
MEAN CORPUSCULAR HEMOGLOBIN CONC: 32.6 g/dL (ref 32.0–36.0)
MEAN CORPUSCULAR HEMOGLOBIN: 28.1 pg (ref 25.9–32.4)
MEAN CORPUSCULAR VOLUME: 86.3 fL (ref 77.6–95.7)
MEAN PLATELET VOLUME: 6.2 fL — ABNORMAL LOW (ref 6.8–10.7)
MONOCYTES ABSOLUTE COUNT: 0.9 10*9/L — ABNORMAL HIGH (ref 0.3–0.8)
MONOCYTES RELATIVE PERCENT: 8.8 %
NEUTROPHILS ABSOLUTE COUNT: 6.4 10*9/L (ref 1.8–7.8)
NEUTROPHILS RELATIVE PERCENT: 60.9 %
PLATELET COUNT: 474 10*9/L — ABNORMAL HIGH (ref 150–450)
RED BLOOD CELL COUNT: 3.16 10*12/L — ABNORMAL LOW (ref 4.26–5.60)
RED CELL DISTRIBUTION WIDTH: 19.1 % — ABNORMAL HIGH (ref 12.2–15.2)
WBC ADJUSTED: 10.5 10*9/L (ref 3.6–11.2)

## 2022-10-01 LAB — RENAL FUNCTION PANEL
ALBUMIN: 2.4 g/dL — ABNORMAL LOW (ref 3.4–5.0)
ANION GAP: 7 mmol/L (ref 5–14)
BLOOD UREA NITROGEN: 13 mg/dL (ref 9–23)
BUN / CREAT RATIO: 14
CALCIUM: 8.3 mg/dL — ABNORMAL LOW (ref 8.7–10.4)
CHLORIDE: 107 mmol/L (ref 98–107)
CO2: 24 mmol/L (ref 20.0–31.0)
CREATININE: 0.93 mg/dL
EGFR CKD-EPI (2021) MALE: >90 mL/min/{1.73_m2} (ref >=60)
GLUCOSE RANDOM: 98 mg/dL (ref 70–179)
PHOSPHORUS: 3.1 mg/dL (ref 2.4–5.1)
POTASSIUM: 4.3 mmol/L (ref 3.4–4.8)
SODIUM: 138 mmol/L (ref 135–145)

## 2022-10-01 LAB — HEMOGLOBIN AND HEMATOCRIT, BLOOD
HEMATOCRIT: 28.3 % — ABNORMAL LOW (ref 39.0–48.0)
HEMOGLOBIN: 8.9 g/dL — ABNORMAL LOW (ref 12.9–16.5)

## 2022-10-01 LAB — C-REACTIVE PROTEIN: C-REACTIVE PROTEIN: 8 mg/L (ref <=10.0)

## 2022-10-01 LAB — MAGNESIUM: MAGNESIUM: 1.9 mg/dL (ref 1.6–2.6)

## 2022-10-01 LAB — TACROLIMUS LEVEL, TIMED: TACROLIMUS BLOOD: 2.3 ng/mL

## 2022-10-01 MED ORDER — PREDNISONE 20 MG TABLET
ORAL_TABLET | Freq: Every day | ORAL | 0 refills | 15.00000 days
Start: 2022-10-01 — End: ?

## 2022-10-01 MED ORDER — SULFAMETHOXAZOLE 800 MG-TRIMETHOPRIM 160 MG TABLET
ORAL_TABLET | Freq: Every day | ORAL | 0 refills | 30.00000 days
Start: 2022-10-01 — End: ?

## 2022-10-01 MED ORDER — FERROUS SULFATE 325 MG (65 MG IRON) TABLET,DELAYED RELEASE
ORAL_TABLET | Freq: Two times a day (BID) | ORAL | 0 refills | 5.00000 days
Start: 2022-10-01 — End: 2022-10-06

## 2022-10-01 MED ORDER — TACROLIMUS 1 MG CAPSULE, IMMEDIATE-RELEASE
ORAL_CAPSULE | Freq: Every evening | ORAL | 11 refills | 30.00000 days | Status: CN
Start: 2022-10-01 — End: 2023-10-01

## 2022-10-01 MED ADMIN — tacrolimus (PROGRAF) capsule 2 mg: 2 mg | ORAL | @ 13:00:00

## 2022-10-01 MED ADMIN — sodium ferric gluconate (FERRLECIT) 250 mg in sodium chloride (NS) 0.9 % 100 mL IVPB: 250 mg | INTRAVENOUS | @ 13:00:00 | Stop: 2022-10-02

## 2022-10-01 MED ADMIN — pravastatin (PRAVACHOL) tablet 40 mg: 40 mg | ORAL | @ 13:00:00

## 2022-10-01 MED ADMIN — levothyroxine (SYNTHROID) tablet 175 mcg: 175 ug | ORAL | @ 13:00:00

## 2022-10-01 MED ADMIN — insulin lispro (HumaLOG) injection 0-20 Units: 0-20 [IU] | SUBCUTANEOUS | @ 16:00:00

## 2022-10-01 MED ADMIN — insulin lispro (HumaLOG) injection 0-20 Units: 0-20 [IU] | SUBCUTANEOUS | @ 02:00:00

## 2022-10-01 MED ADMIN — insulin lispro (HumaLOG) injection 0-20 Units: 0-20 [IU] | SUBCUTANEOUS | @ 21:00:00

## 2022-10-01 MED ADMIN — sodium ferric gluconate (FERRLECIT) 250 mg in sodium chloride (NS) 0.9 % 100 mL IVPB: 250 mg | INTRAVENOUS | @ 02:00:00 | Stop: 2022-10-02

## 2022-10-01 MED ADMIN — tacrolimus (PROGRAF) capsule 3 mg: 3 mg | ORAL | @ 02:00:00

## 2022-10-01 MED ADMIN — predniSONE (DELTASONE) tablet 40 mg: 40 mg | ORAL | @ 13:00:00

## 2022-10-01 MED ADMIN — sertraline (ZOLOFT) tablet 50 mg: 50 mg | ORAL | @ 13:00:00

## 2022-10-01 MED ADMIN — empagliflozin (JARDIANCE) tablet 10 mg: 10 mg | ORAL | @ 13:00:00 | Stop: 2022-10-01

## 2022-10-01 MED ADMIN — sulfamethoxazole-trimethoprim (BACTRIM DS) 800-160 mg tablet 160 mg of trimethoprim: 1 | ORAL | @ 13:00:00 | Stop: 2022-10-18

## 2022-10-01 MED ADMIN — aspirin tablet 325 mg: 325 mg | ORAL | @ 13:00:00

## 2022-10-01 MED ADMIN — metoPROLOL tartrate (Lopressor) tablet 25 mg: 25 mg | ORAL | @ 02:00:00

## 2022-10-01 MED ADMIN — metoPROLOL tartrate (Lopressor) tablet 25 mg: 25 mg | ORAL | @ 13:00:00

## 2022-10-01 NOTE — Unmapped (Addendum)
Alert and oriented X 4. VSS, on RA. CPAP utilized at night with continuous O2 monitoring.  Pt had 2 bloody BM during this shift. Able to ambulate independently to the bathroom. Denies pain or dizziness. All needs met at this time.     Problem: Adult Inpatient Plan of Care  Goal: Plan of Care Review  Outcome: Progressing  Goal: Patient-Specific Goal (Individualized)  Outcome: Progressing  Goal: Absence of Hospital-Acquired Illness or Injury  Outcome: Progressing  Intervention: Identify and Manage Fall Risk  Recent Flowsheet Documentation  Taken 10/01/2022 0553 by Talbot Grumbling, RN  Safety Interventions:   commode/urinal/bedpan at bedside   fall reduction program maintained   lighting adjusted for tasks/safety   low bed   environmental modification  Goal: Optimal Comfort and Wellbeing  Outcome: Progressing  Intervention: Monitor Pain and Promote Comfort  Recent Flowsheet Documentation  Taken 10/01/2022 0553 by Talbot Grumbling, RN  Pain Management Interventions:   around-the-clock dosing utilized   care clustered   quiet environment facilitated   medication offered   relaxation techniques promoted  Goal: Readiness for Transition of Care  Outcome: Progressing  Goal: Rounds/Family Conference  Outcome: Progressing     Problem: Wound  Goal: Optimal Coping  Outcome: Progressing  Intervention: Support Patient and Family Response  Recent Flowsheet Documentation  Taken 10/01/2022 0553 by Talbot Grumbling, RN  Supportive Measures: active listening utilized  Goal: Optimal Functional Ability  Outcome: Progressing  Goal: Absence of Infection Signs and Symptoms  Outcome: Progressing  Goal: Improved Oral Intake  Outcome: Progressing  Goal: Optimal Pain Control and Function  Outcome: Progressing  Intervention: Prevent or Manage Pain  Recent Flowsheet Documentation  Taken 10/01/2022 0553 by Talbot Grumbling, RN  Pain Management Interventions:   around-the-clock dosing utilized   care clustered   quiet environment facilitated medication offered   relaxation techniques promoted  Sleep/Rest Enhancement:   awakenings minimized   regular sleep/rest pattern promoted   noise level reduced   relaxation techniques promoted  Goal: Skin Health and Integrity  Outcome: Progressing  Goal: Optimal Wound Healing  Outcome: Progressing  Intervention: Promote Wound Healing  Flowsheets (Taken 10/01/2022 0553)  Sleep/Rest Enhancement:   awakenings minimized   regular sleep/rest pattern promoted   noise level reduced   relaxation techniques promoted     Problem: Fall Injury Risk  Goal: Absence of Fall and Fall-Related Injury  Outcome: Progressing  Intervention: Promote Merchandiser, retail (Taken 10/01/2022 [phone])  Safety Interventions:   commode/urinal/bedpan at bedside   fall reduction program maintained   lighting adjusted for tasks/safety   low bed   environmental modification     Problem: Malnutrition  Goal: Improved Nutritional Intake  Outcome: Progressing

## 2022-10-01 NOTE — Unmapped (Signed)
Gastroenterology (Luminal) Consult Service   Progress Note         Assessment and Recommendations:   Anthony Alvarado is a 62 y.o. male with cryptogenic cirrhosis status post liver transplant and managed on tacrolimus and mycophenolate who developed hematochezia and diarrhea and found to have a new diagnosis of colitis on 7/17 colonoscopy, more consistent with drug-induced colitis (mycophenolate) rather than inflammatory bowel disease on endoscopic appearance and pathology (acute, not chronic colitis). Received one dose of inflixamab (8/3), IV steroids, switched to PO steroids (8/6).     Was improving and ready for discharge but overnight had a bloody bowel movement. Unclear at this time whether or not bleeding is secondary to worsening colitis (less likely since patient was improving) or from his hemorrhoids (last colonoscopy with large, prolapsed hemorrhoids). Will plan for flexible sigmoidoscopy tomorrow.     Recommendations:  Continue PO steroids  Plan for flexible sigmoidoscopy tomorrow     GI Pre-Procedure Checklist  Procedure: Flexible Sigmoidoscopy  Anticipated Date of Procedure: 8/9  Anticoagulants/Antiplatelets: Not Applicable  Anesthesia Concerns: None  Diet: Order regular diet and make NPO at 12AM (midnight) day of procedure  Prep: Administer two tap water enemas 30 minutes apart    Issues Impacting Complexity of Management:  -None    Recommendations discussed with the patient's primary team. We will continue to follow along with you.    For questions, contact the on-call fellow for the Gastroenterology (Luminal) Consult Service.    Subjective:   8/5: 1 bowel movement   8/6: 7 bowel movements (brown stools)  8/7: 5 bowel movements (blood with wiping)  8/8: 3 bowel movements (all with blood)    Denies abdominal pain, nausea/vomiting.     Objective:   Temp:  [36.6 ??C (97.9 ??F)-36.8 ??C (98.2 ??F)] 36.8 ??C (98.2 ??F)  Heart Rate:  [62-75] 68  Resp:  [17-18] 18  BP: (110-142)/(74-95) 137/91  FiO2 (%): [21 %] 21 %  SpO2:  [96 %-100 %] 98 %    Gen: WDWN male in NAD, answers questions appropriately  Abdomen: Soft, NTND, no rebound/guarding, no hepatosplenomegaly  Extremities: No edema in the BLEs    Pertinent Labs/Studies:  -I have reviewed the patient's labs from 8/8 which show down-trending Hgb  CRP downtrending

## 2022-10-01 NOTE — Unmapped (Addendum)
Pt alert and oriented x 4.Up ad lib in room.Falls precautions maintained.No complaints of pain this shift.3 bloody BM this shift.MD notified.H&H stable at 8.9/28.3.Labs continues to be monitored.Updated on plan of care by MD.No further need a voiced.VSS.Call bell within reach.plan of care continues  Problem: Adult Inpatient Plan of Care  Goal: Plan of Care Review  Outcome: Progressing  Flowsheets (Taken 10/01/2022 1253)  Progress: improving  Plan of Care Reviewed With: patient  Goal: Patient-Specific Goal (Individualized)  Outcome: Progressing  Flowsheets (Taken 10/01/2022 1253)  Patient/Family-Specific Goals (Include Timeframe): Pt will remain hemodynamically stable during this shift 7a-7p  Individualized Care Needs: falls,monitor for bleeding,monitor labs,v/s,achs,continuous pulse ox monitoring,CPAP,  Anxieties, Fears or Concerns: none voiced  Goal: Absence of Hospital-Acquired Illness or Injury  Outcome: Progressing  Intervention: Identify and Manage Fall Risk  Recent Flowsheet Documentation  Taken 10/01/2022 1200 by Ileene Rubens, RN  Safety Interventions: environmental modification  Taken 10/01/2022 1000 by Ileene Rubens, RN  Safety Interventions: environmental modification  Taken 10/01/2022 0730 by Ileene Rubens, RN  Safety Interventions: environmental modification  Intervention: Prevent and Manage VTE (Venous Thromboembolism) Risk  Recent Flowsheet Documentation  Taken 10/01/2022 1200 by Ileene Rubens, RN  Anti-Embolism Device Type: SCD, Knee  Anti-Embolism Intervention: Refused  Anti-Embolism Device Location: BLE  Taken 10/01/2022 1000 by Ileene Rubens, RN  Anti-Embolism Device Type: SCD, Knee  Anti-Embolism Intervention: Refused  Anti-Embolism Device Location: BLE  Taken 10/01/2022 0730 by Ileene Rubens, RN  Anti-Embolism Device Type: SCD, Knee  Anti-Embolism Intervention: Refused  Anti-Embolism Device Location: BLE  Goal: Optimal Comfort and Wellbeing  Outcome: Progressing  Intervention: Monitor Pain and Promote Comfort  Recent Flowsheet Documentation  Taken 10/01/2022 1253 by Ileene Rubens, RN  Pain Management Interventions: quiet environment facilitated  Goal: Readiness for Transition of Care  Outcome: Progressing  Goal: Rounds/Family Conference  Outcome: Progressing  Flowsheets (Taken 10/01/2022 1253)  Participants:   nursing   patient   physician

## 2022-10-01 NOTE — Unmapped (Signed)
Tacrolimus Therapeutic Monitoring Pharmacy Note  Anthony Alvarado is a 62 y.o. male continuing tacrolimus.     Indication: Liver transplant     Date of Transplant:  08/2018       Prior Dosing Information: Home regimen 2 mg BID      See Below for Current Inpatient Dosing and Levels    Source(s) of information used to determine prior to admission dosing: Home Medication List, Clinic Note, or Fill HIstory    Goals:  Therapeutic Drug Levels  Tacrolimus trough goal:  4-6 ng/mL  per 8/4 consult note    Additional Clinical Monitoring/Outcomes  Monitor renal function (SCr and urine output) and liver function (LFTs)  Monitor for signs/symptoms of adverse events (e.g., hyperglycemia, hyperkalemia, hypomagnesemia, hypertension, headache, tremor)    Results:     Creatinine   Date Value Ref Range Status   10/01/2022 0.93 0.73 - 1.18 mg/dL Final   16/11/9602 5.40 0.73 - 1.18 mg/dL Final   98/12/9145 8.29 0.73 - 1.18 mg/dL Final        Pharmacokinetic Considerations and Significant Drug Interactions:  Concurrent hepatotoxic medications:  pravastatin (hm)    Concurrent CYP3A4 substrates/inhibitors: None identified at the time of this note  Concurrent nephrotoxic medications:  Bactrim, Jardiance (hm)      Tacrolimus Dosing  Tacro 2mg  AM and 2 mg PM (8/4-8/6a)  Tacro 2mg  AM and 3 mg PM (8/6p......)    Tacrolimus Level  8/5 = 2.2 ng/mL at 1000 ~ 13.5h after last dose (8/4 2mg  at ~2030)  8/6 = 2.3 ng/mL at 0823 ~12 hours after last dose (8/5 2 mg at 2046)  8/7 = 4.2 ng/mL at 0852  8/8 = 2.3 ng/mL at 0832 ~ 11 hrs last dose (8/7 3 mg at 2133)    Assessment/Plan:  8/5 Scr 0.92, 8/8 Scr 0.93  Tacro level remains below goal 8/5, 8/6, and 8/8. Trough was drawn appropriately.  Tacro level is at goal on 8/7. Reevaluate dose after 8/9 tacro trough is drawn.     Recommendation(s) unless otherwise indicated by TRANSPLANT TEAM  Continue evening dose of 3 mg and morning dose of 2 mg  Consider adjusting dose after 8/9 trough is drawn as needed    Follow-up  Tacrolimus Level:  DAILY between 0600-0800 PRIOR to morning dose.  Dose due 0800  A pharmacist will continue to monitor and recommend levels as appropriate    Longitudinal Dose Monitoring:  Date Dose (mg), Route AM Scr (mg/dL) Level  (ng/mL) Key Drug Interactions   10/01/22 2 mg AM   3mg  PM PO 0.93 2.3    09/30/22 2 mg AM   3mg  PM PO 1.03 4.2    09/29/22 2 mg AM   3mg  PM PO 0.88 2.3    09/28/22 2 mg AM  2mg  PM PO 0.92 2.2      Please page service pharmacist with questions/clarifications.    Geanie Berlin  PharmD Candidate PY4 Class of 2025     Phoebe Perch, PharmD, BCPS  Clinical Pharmacist  PG: [phone]

## 2022-10-01 NOTE — Unmapped (Signed)
Internal Medicine (MEDU) Progress Note    Assessment & Plan:   62 y.o. 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    Principal Problem:    Colitis  Active Problems:    Liver transplant recipient (CMS-HCC)    Diabetes mellitus without complication (CMS-HCC)    Hypothyroidism    OSA (obstructive sleep apnea)    Essential hypertension  Resolved Problems:    * No resolved hospital problems. *    Active Problems    Recurrent colitis presumably due to CellCept versus IBD   Persistent diarrhea - improved  Acute blood loss anemia   Diarrhea initially started 05/2022. He had colonoscopy on 7/17 which showed severe mucosal changes throughout the colon and pathology showed active colitis with ulceration with differential including infectious etiology versus drug induced versus inflammatory bowel disease.  Flex sig at OSH w/ similar findings and CMV-. Most likely IBD, though cannot r/o mycophenolate associated colitis. s/p infliximab high dose 8/3 with symptomatic improvement.   On 8/8 patient had multiple episodes of diarrhea with a significant amount of blood.  - GI consulted, following   - Continue 40mg  Prednisone PO  -- Flex sig scheduled with GI for 8/9 due to more blood bowel movements   -- obtain daily tac trough  -- Cellcept permanently discontinued per GI  -- Follow up on Hgb on 8/9  -- IV iron started 8/7  -- Continue aspirin and DVT ppx  -- Quantiferon Gold indeterminate -- follow up on repeat (8/8)  -- Replete magnesium as needed.     Hyperkalemia - resolved  Potassium normalized to 4.7 on 8/8. Restarted bactrim. Continue to monitor.     Bradycardia   Patient had episode of bradycardia to 49 on 8/5. Asymptomatic. Patient reports usually having HR in 60's. Reported side effect from Infliximab.   - EKG showed sinus Huston Foley.   - Continue to monitor.     Type 2 diabetes mellitus-   POCT glucose checks have been elevated into the 300's throughout his hospitalization requiring sliding scale insulin.   -- Increased jardiance 25mg  daily.   -- continue SSI during hospitalization     Cryptogenic cirrhosis status post liver transplant recipient (2020)  Mycophenolate recently discontinued 7/27 due to concern for mycophenolate induced colitis. No tenderness to palpation on abdominal exam, abdomen is not distended or tense.Tacrolimus trough subtherapeutic on 8/6.   - Increased dose to 2mg  AM and 3mg  PM (8/6)  -- Tac trough daily     Aortic stenosis status post AVR aortic valve replacement  Bioprosthetic aortic valve placed in 2023.  Not on anticoagulation.  Currently holding aspirin in setting of recent bleeding but need to restart soon when safe.  - Restart aspirin 325 mg daily     CAD status post CABG  Last echo in 2023 with left ejection fraction of 55 to 60% and functioning aortic heart valve.  - On telemetry   - Restart aspirin as above  - Restart metoprolol tartrate 25 mg twice daily    Chronic Problems  Hyperlipidemia-Continue prvastiatin 40mg  daily   OSA-Continue nighttime CPAP   Hypertension- continue metoprol tartrate 25mg  BID,   Hypothyroidism-Continuej levothyroxine 137.  MDD: Continue sertraline 50mg  daily       Malnutrition Assessment using AND/ASPEN or  GLIM Clinical Characteristics:   Non-severe (Moderate) Protein-Calorie Malnutrition in the context of chronic illness (09/29/22 1147)  Interpretation of Wt. Loss: > or equal to 10% x 6 month (20% x 5 months)  Fat Loss: Moderate  Muscle Loss: Moderate  Malnutrition Score: 3    Checklist:  Diet: Regular Diet  DVT PPx: SCDs  Code Status: Full Code  Dispo: Patient appropriate for Inpatient based on expectation of ongoing need for hospitalization greater than two midnights based on severity of presentation/services including bloody diarrhea requiring IVF and daily labs.    Team Contact Information:   Primary Team: Internal Medicine (MEDU)  Primary Resident: Meryl Crutch, DO  Resident's Pager: [phone] (Gen [facility] Intern - Alvester Morin)    Interval history:   Patient reports having 2 episodes of diarrhea with blood. He denies any pain or rapid heart rate. He states that he felt a little dizzy after seeing the blood but that is the only symptom he has experienced. Continues to have appetite.    Objective:   Physical Exam:  Temp:  [36.6 ??C (97.9 ??F)-36.8 ??C (98.2 ??F)] 36.8 ??C (98.2 ??F)  Heart Rate:  [62-86] 86  Resp:  [15-18] 15  BP: (90-142)/(59-95) 90/59  FiO2 (%):  [21 %] 21 %  SpO2:  [98 %-100 %] 98 %    Gen: NAD, converses clearly.   Heart: RRR  Lungs: Comfortably breathing on RA  Abdomen: Non-distended  Psych: Alert, oriented    Teddy Spike, DO PGY1

## 2022-10-02 LAB — BASIC METABOLIC PANEL
ANION GAP: 6 mmol/L (ref 5–14)
BLOOD UREA NITROGEN: 20 mg/dL (ref 9–23)
BUN / CREAT RATIO: 18
CALCIUM: 8.3 mg/dL — ABNORMAL LOW (ref 8.7–10.4)
CHLORIDE: 109 mmol/L — ABNORMAL HIGH (ref 98–107)
CO2: 24 mmol/L (ref 20.0–31.0)
CREATININE: 1.13 mg/dL
EGFR CKD-EPI (2021) MALE: 74 mL/min/{1.73_m2} (ref >=60)
GLUCOSE RANDOM: 111 mg/dL (ref 70–179)
POTASSIUM: 4.6 mmol/L (ref 3.4–4.8)
SODIUM: 139 mmol/L (ref 135–145)

## 2022-10-02 LAB — MAGNESIUM: MAGNESIUM: 1.8 mg/dL (ref 1.6–2.6)

## 2022-10-02 LAB — CBC
HEMATOCRIT: 21.1 % — ABNORMAL LOW (ref 39.0–48.0)
HEMATOCRIT: 22.3 % — ABNORMAL LOW (ref 39.0–48.0)
HEMOGLOBIN: 6.8 g/dL — ABNORMAL LOW (ref 12.9–16.5)
HEMOGLOBIN: 7.3 g/dL — ABNORMAL LOW (ref 12.9–16.5)
MEAN CORPUSCULAR HEMOGLOBIN CONC: 32.5 g/dL (ref 32.0–36.0)
MEAN CORPUSCULAR HEMOGLOBIN CONC: 32.7 g/dL (ref 32.0–36.0)
MEAN CORPUSCULAR HEMOGLOBIN: 28.8 pg (ref 25.9–32.4)
MEAN CORPUSCULAR HEMOGLOBIN: 28.8 pg (ref 25.9–32.4)
MEAN CORPUSCULAR VOLUME: 88.6 fL (ref 77.6–95.7)
MEAN CORPUSCULAR VOLUME: 88.2 fL (ref 77.6–95.7)
MEAN PLATELET VOLUME: 6.5 fL — ABNORMAL LOW (ref 6.8–10.7)
MEAN PLATELET VOLUME: 6.5 fL — ABNORMAL LOW (ref 6.8–10.7)
PLATELET COUNT: 437 10*9/L (ref 150–450)
PLATELET COUNT: 465 10*9/L — ABNORMAL HIGH (ref 150–450)
RED BLOOD CELL COUNT: 2.38 10*12/L — ABNORMAL LOW (ref 4.26–5.60)
RED BLOOD CELL COUNT: 2.53 10*12/L — ABNORMAL LOW (ref 4.26–5.60)
RED CELL DISTRIBUTION WIDTH: 20.3 % — ABNORMAL HIGH (ref 12.2–15.2)
RED CELL DISTRIBUTION WIDTH: 21.2 % — ABNORMAL HIGH (ref 12.2–15.2)
WBC ADJUSTED: 9.7 10*9/L (ref 3.6–11.2)
WBC ADJUSTED: 10.3 10*9/L (ref 3.6–11.2)

## 2022-10-02 LAB — TACROLIMUS LEVEL, TIMED: TACROLIMUS BLOOD: 1.9 ng/mL

## 2022-10-02 LAB — C-REACTIVE PROTEIN: C-REACTIVE PROTEIN: 9 mg/L (ref <=10.0)

## 2022-10-02 MED ORDER — FERROUS SULFATE 325 MG (65 MG IRON) TABLET,DELAYED RELEASE
ORAL_TABLET | Freq: Two times a day (BID) | ORAL | 0 refills | 5.00000 days | Status: CN
Start: 2022-10-02 — End: 2022-10-07

## 2022-10-02 MED ORDER — TACROLIMUS 1 MG CAPSULE, IMMEDIATE-RELEASE
ORAL_CAPSULE | Freq: Two times a day (BID) | ORAL | 11 refills | 30.00000 days
Start: 2022-10-02 — End: 2023-10-02

## 2022-10-02 MED ORDER — HYDROCORTISONE 1 % TOPICAL CREAM
0 refills | 0.00000 days
Start: 2022-10-02 — End: ?

## 2022-10-02 MED ADMIN — tacrolimus (PROGRAF) capsule 3 mg: 3 mg | ORAL | @ 18:00:00

## 2022-10-02 MED ADMIN — sertraline (ZOLOFT) tablet 50 mg: 50 mg | ORAL | @ 18:00:00

## 2022-10-02 MED ADMIN — lactated Ringers infusion: 10 mL/h | INTRAVENOUS | @ 15:00:00

## 2022-10-02 MED ADMIN — predniSONE (DELTASONE) tablet 40 mg: 40 mg | ORAL | @ 18:00:00

## 2022-10-02 MED ADMIN — sodium ferric gluconate (FERRLECIT) 250 mg in sodium chloride (NS) 0.9 % 100 mL IVPB: 250 mg | INTRAVENOUS | @ 01:00:00 | Stop: 2022-10-02

## 2022-10-02 MED ADMIN — Propofol (DIPRIVAN) injection: INTRAVENOUS | @ 17:00:00 | Stop: 2022-10-02

## 2022-10-02 MED ADMIN — insulin lispro (HumaLOG) injection 0-20 Units: 0-20 [IU] | SUBCUTANEOUS | @ 01:00:00

## 2022-10-02 MED ADMIN — sulfamethoxazole-trimethoprim (BACTRIM DS) 800-160 mg tablet 160 mg of trimethoprim: 1 | ORAL | @ 18:00:00 | Stop: 2022-10-18

## 2022-10-02 MED ADMIN — tacrolimus (PROGRAF) capsule 3 mg: 3 mg | ORAL | @ 01:00:00

## 2022-10-02 MED ADMIN — levothyroxine (SYNTHROID) tablet 175 mcg: 175 ug | ORAL | @ 18:00:00

## 2022-10-02 MED ADMIN — lidocaine (PF) (XYLOCAINE-MPF) 20 mg/mL (2 %) injection: INTRAVENOUS | @ 17:00:00 | Stop: 2022-10-02

## 2022-10-02 MED ADMIN — sodium ferric gluconate (FERRLECIT) 250 mg in sodium chloride (NS) 0.9 % 100 mL IVPB: 250 mg | INTRAVENOUS | @ 18:00:00 | Stop: 2022-10-02

## 2022-10-02 MED ADMIN — insulin lispro (HumaLOG) injection 0-20 Units: 0-20 [IU] | SUBCUTANEOUS | @ 23:00:00

## 2022-10-02 MED ADMIN — empagliflozin (JARDIANCE) tablet 25 mg: 25 mg | ORAL | @ 18:00:00

## 2022-10-02 MED ADMIN — pravastatin (PRAVACHOL) tablet 40 mg: 40 mg | ORAL | @ 18:00:00

## 2022-10-02 MED ADMIN — propofol (DIPRIVAN) infusion 10 mg/mL: INTRAVENOUS | @ 17:00:00 | Stop: 2022-10-02

## 2022-10-02 NOTE — Unmapped (Signed)
Pt with adm dx of Colitis . Pt A & O x 4 and able to express needs . VSS , Afebrile , Room air . Pt voiced no complaints , denied pain 0/10 , no N/V , no SOB/dyspnea . No bleeding noted as of yet . Assistance with ADLS provided . Pt OOB to BR , steady gait . Falls and safety precautions reinforced with bed low and locked , SR up x 3 , call bell within reach . Pt resting quietly in bed  with eyes closed . No s/sx of acute respiratory or cardiac distress noted . Will continue with current POC . NPO since MN for possible Sigmoidoscopy on 10/02/22 .     Problem: Adult Inpatient Plan of Care  Goal: Plan of Care Review  Outcome: Progressing  Goal: Patient-Specific Goal (Individualized)  Outcome: Progressing  Flowsheets (Taken 10/02/2022 0343)  Patient/Family-Specific Goals (Include Timeframe): Pt will remain hemodynamically stable and free from falls and injuries during this shift 7p - 7a .  Individualized Care Needs: V/S , labs , ACHS , pain mgmt ,Ferrous Gluconate infusion , NPO , Sigmoidoscopy , Cont. Pulse Oximetry monitor , CPAP , Bleeding Precaution , falls/safety precautions .  Anxieties, Fears or Concerns: Pt voiced no concerns  Goal: Absence of Hospital-Acquired Illness or Injury  Outcome: Progressing  Intervention: Identify and Manage Fall Risk  Recent Flowsheet Documentation  Taken 10/02/2022 0200 by Harlow Ohms, RN  Safety Interventions:   fall reduction program maintained   environmental modification   low bed   nonskid shoes/slippers when out of bed   room near unit station  Taken 10/02/2022 0000 by Harlow Ohms, RN  Safety Interventions:   fall reduction program maintained   environmental modification   low bed   nonskid shoes/slippers when out of bed   room near unit station  Taken 10/01/2022 2200 by Harlow Ohms, RN  Safety Interventions:   fall reduction program maintained   environmental modification   low bed   nonskid shoes/slippers when out of bed   room near unit station  Taken 10/01/2022 2000 by Harlow Ohms, RN  Safety Interventions:   fall reduction program maintained   environmental modification   low bed   nonskid shoes/slippers when out of bed   room near unit station  Intervention: Prevent Skin Injury  Recent Flowsheet Documentation  Taken 10/01/2022 2000 by Harlow Ohms, RN  Positioning for Skin: Supine/Back  Skin Protection: incontinence pads utilized  Intervention: Prevent and Manage VTE (Venous Thromboembolism) Risk  Recent Flowsheet Documentation  Taken 10/02/2022 0200 by Annamaria Helling A, RN  Anti-Embolism Device Type: SCD, Knee  Anti-Embolism Intervention: Refused  Anti-Embolism Device Location: BLE  Taken 10/02/2022 0000 by Harlow Ohms, RN  Anti-Embolism Device Type: SCD, Knee  Anti-Embolism Intervention: Refused  Anti-Embolism Device Location: BLE  Taken 10/01/2022 2200 by Annamaria Helling A, RN  Anti-Embolism Device Type: SCD, Knee  Anti-Embolism Intervention: Refused  Anti-Embolism Device Location: BLE  Taken 10/01/2022 2000 by Harlow Ohms, RN  VTE Prevention/Management: anticoagulant therapy  Anti-Embolism Device Type: SCD, Knee  Anti-Embolism Intervention: Refused  Anti-Embolism Device Location: BLE  Intervention: Prevent Infection  Recent Flowsheet Documentation  Taken 10/02/2022 0200 by Harlow Ohms, RN  Infection Prevention: rest/sleep promoted  Taken 10/02/2022 0000 by Harlow Ohms, RN  Infection Prevention: rest/sleep promoted  Taken 10/01/2022 2200 by Harlow Ohms, RN  Infection Prevention: environmental surveillance performed  Taken 10/01/2022 2000 by Harlow Ohms, RN  Infection Prevention: environmental surveillance performed  Goal: Optimal Comfort and Wellbeing  Outcome: Progressing  Goal: Readiness for Transition of Care  Outcome: Progressing  Goal: Rounds/Family Conference  Outcome: Progressing

## 2022-10-02 NOTE — Unmapped (Signed)
Tacrolimus Therapeutic Monitoring Pharmacy Note  Anthony Alvarado is a 62 y.o. male continuing tacrolimus.     Indication: Liver transplant     Date of Transplant:  08/2018       Prior Dosing Information: Home regimen 2 mg BID      See Below for Current Inpatient Dosing and Levels    Source(s) of information used to determine prior to admission dosing: Home Medication List, Clinic Note, or Fill HIstory    Goals:  Therapeutic Drug Levels  Tacrolimus trough goal:  4-6 ng/mL  per 8/4 consult note    Additional Clinical Monitoring/Outcomes  Monitor renal function (SCr and urine output) and liver function (LFTs)  Monitor for signs/symptoms of adverse events (e.g., hyperglycemia, hyperkalemia, hypomagnesemia, hypertension, headache, tremor)    Results:     Creatinine   Date Value Ref Range Status   10/02/2022 1.13 0.73 - 1.18 mg/dL Final   16/11/9602 5.40 0.73 - 1.18 mg/dL Final   98/12/9145 8.29 0.73 - 1.18 mg/dL Final        Pharmacokinetic Considerations and Significant Drug Interactions:  Concurrent hepatotoxic medications:  pravastatin (hm)    Concurrent CYP3A4 substrates/inhibitors: None identified at the time of this note  Concurrent nephrotoxic medications:  Bactrim, Jardiance (hm)      Tacrolimus Dosing  Tacro 2mg  AM and 2 mg PM (8/4-8/6a)  Tacro 2mg  AM and 3 mg PM (8/6p......)    Tacrolimus Level  8/5 = 2.2 ng/mL at 1000 ~ 13.5h after last dose (8/4 2mg  at ~2030)  8/6 = 2.3 ng/mL at 0823 ~12 hours after last dose (8/5 2 mg at 2046)  8/7 = 4.2 ng/mL at 0852  8/8 = 2.3 ng/mL at 0832 ~ 11 hrs after last dose (8/7 3 mg at 2133)  8/9 = 1.9 ng/mL at 0812 ~ 12 hrs after last dose (8/8 3 mg at 2037)    Assessment/Plan:  8/5 Scr 0.92, 8/9 Scr 1.13  Tacro level remains below goal 8/5, 8/6, 8/8, and 8/9. Trough was drawn appropriately.  Reassess tacrolimus dosing after Monday 8/12 trough     Recommendation(s) unless otherwise indicated by TRANSPLANT TEAM  Increase morning dose to 3 mg and continue evening dose of 3 mg Follow-up  Tacrolimus Level:  DAILY between 0600-0800 PRIOR to morning dose.  Dose due 0800  A pharmacist will continue to monitor and recommend levels as appropriate    Longitudinal Dose Monitoring:  Date Dose (mg), Route AM Scr (mg/dL) Level  (ng/mL) Key Drug Interactions          10/02/22 3 mg AM   3 mg PM PO 1.13 1.9    10/01/22 2 mg AM   3mg  PM PO 0.93 2.3    09/30/22 2 mg AM   3mg  PM PO 1.03 4.2    09/29/22 2 mg AM   3mg  PM PO 0.88 2.3    09/28/22 2 mg AM  2mg  PM PO 0.92 2.2      Please page service pharmacist with questions/clarifications.    Geanie Berlin  PharmD Candidate PY4 Class of 2025       [place] [employer], PharmD, BCPS  Medicine Clinical Pharmacist

## 2022-10-02 NOTE — Unmapped (Signed)
Gastroenterology (Luminal) Consult Service   Progress Note         Assessment and Recommendations:   Anthony Alvarado is a 62 y.o. male with cryptogenic cirrhosis status post liver transplant and managed on tacrolimus and mycophenolate who developed hematochezia and diarrhea and found to have a new diagnosis of colitis on 7/17 colonoscopy, consistent with drug-induced colitis (mycophenolate) rather than inflammatory bowel disease on endoscopic appearance and pathology (acute, not chronic colitis). Received one dose of inflixamab (8/3), IV steroids, switched to PO steroids (8/6) with improvement in symptoms.     S/p flexible sigmoidoscopy 10/02/2022 with findings negative for active inflammation. Exam did re-demonstrate large external hemorrhoids which was likely the source of bleeding.     Recommendations:  Continue prednisone taper  Outpatient GI follow-up   Can try hydrocortisone cream, witch hazel for symptomatic treatment of hemorrhoids    Issues Impacting Complexity of Management:  -None    Recommendations discussed with the patient's primary team. We will continue to follow along with you.    For questions, contact the on-call fellow for the Gastroenterology (Luminal) Consult Service.    Subjective:   No acute events overnight.    Objective:   Temp:  [36.5 ??C-36.9 ??C] 36.5 ??C  Heart Rate:  [72-86] 72  SpO2 Pulse:  [73-74] 73  Resp:  [14-20] 14  BP: (88-125)/(51-74) 103/55  FiO2 (%):  [21 %] 21 %  SpO2:  [98 %-100 %] 99 %    Gen: WDWN male in NAD, answers questions appropriately  Abdomen: Soft, NTND, no rebound/guarding, no hepatosplenomegaly  Extremities: No edema in the BLEs    Pertinent Labs/Studies:  -I have reviewed the patient's labs from 8/9 which show down-trending Hgb  CRP stable

## 2022-10-03 LAB — BASIC METABOLIC PANEL
ANION GAP: 6 mmol/L (ref 5–14)
BLOOD UREA NITROGEN: 19 mg/dL (ref 9–23)
BUN / CREAT RATIO: 17
CALCIUM: 8.8 mg/dL (ref 8.7–10.4)
CHLORIDE: 104 mmol/L (ref 98–107)
CO2: 24 mmol/L (ref 20.0–31.0)
CREATININE: 1.1 mg/dL
EGFR CKD-EPI (2021) MALE: 77 mL/min/{1.73_m2} (ref >=60)
GLUCOSE RANDOM: 147 mg/dL (ref 70–179)
POTASSIUM: 5.1 mmol/L — ABNORMAL HIGH (ref 3.4–4.8)
SODIUM: 134 mmol/L — ABNORMAL LOW (ref 135–145)

## 2022-10-03 LAB — CBC
HEMATOCRIT: 25.2 % — ABNORMAL LOW (ref 39.0–48.0)
HEMATOCRIT: 24 % — ABNORMAL LOW (ref 39.0–48.0)
HEMOGLOBIN: 8.5 g/dL — ABNORMAL LOW (ref 12.9–16.5)
HEMOGLOBIN: 8.1 g/dL — ABNORMAL LOW (ref 12.9–16.5)
MEAN CORPUSCULAR HEMOGLOBIN CONC: 33.8 g/dL (ref 32.0–36.0)
MEAN CORPUSCULAR HEMOGLOBIN CONC: 33.8 g/dL (ref 32.0–36.0)
MEAN CORPUSCULAR HEMOGLOBIN: 30.4 pg (ref 25.9–32.4)
MEAN CORPUSCULAR HEMOGLOBIN: 30.2 pg (ref 25.9–32.4)
MEAN CORPUSCULAR VOLUME: 89.9 fL (ref 77.6–95.7)
MEAN CORPUSCULAR VOLUME: 89.1 fL (ref 77.6–95.7)
MEAN PLATELET VOLUME: 6.4 fL — ABNORMAL LOW (ref 6.8–10.7)
MEAN PLATELET VOLUME: 6.8 fL (ref 6.8–10.7)
PLATELET COUNT: 430 10*9/L (ref 150–450)
PLATELET COUNT: 412 10*9/L (ref 150–450)
RED BLOOD CELL COUNT: 2.8 10*12/L — ABNORMAL LOW (ref 4.26–5.60)
RED BLOOD CELL COUNT: 2.7 10*12/L — ABNORMAL LOW (ref 4.26–5.60)
RED CELL DISTRIBUTION WIDTH: 19.8 % — ABNORMAL HIGH (ref 12.2–15.2)
RED CELL DISTRIBUTION WIDTH: 19.7 % — ABNORMAL HIGH (ref 12.2–15.2)
WBC ADJUSTED: 10.6 10*9/L (ref 3.6–11.2)
WBC ADJUSTED: 10.5 10*9/L (ref 3.6–11.2)

## 2022-10-03 LAB — C-REACTIVE PROTEIN: C-REACTIVE PROTEIN: 22 mg/L — ABNORMAL HIGH (ref <=10.0)

## 2022-10-03 MED ORDER — TACROLIMUS 1 MG CAPSULE, IMMEDIATE-RELEASE
ORAL_CAPSULE | Freq: Two times a day (BID) | ORAL | 11 refills | 30.00000 days | Status: CP
Start: 2022-10-03 — End: 2023-10-03
  Filled 2022-10-08: qty 180, 30d supply, fill #0

## 2022-10-03 MED ORDER — EMPAGLIFLOZIN 25 MG TABLET
ORAL_TABLET | Freq: Every day | ORAL | 0 refills | 30.00000 days | Status: CP
Start: 2022-10-03 — End: 2022-11-02

## 2022-10-03 MED ORDER — HYDROCORTISONE 1 % TOPICAL CREAM
TOPICAL | 0 refills | 0.00000 days | Status: CP
Start: 2022-10-03 — End: ?

## 2022-10-03 MED ADMIN — dapsone tablet 100 mg: 100 mg | ORAL | @ 15:00:00 | Stop: 2022-10-03

## 2022-10-03 MED ADMIN — insulin lispro (HumaLOG) injection 0-20 Units: 0-20 [IU] | SUBCUTANEOUS | @ 16:00:00 | Stop: 2022-10-03

## 2022-10-03 MED ADMIN — sulfamethoxazole-trimethoprim (BACTRIM DS) 800-160 mg tablet 160 mg of trimethoprim: 1 | ORAL | @ 12:00:00 | Stop: 2022-10-03

## 2022-10-03 MED ADMIN — pravastatin (PRAVACHOL) tablet 40 mg: 40 mg | ORAL | @ 12:00:00 | Stop: 2022-10-03

## 2022-10-03 MED ADMIN — aspirin tablet 325 mg: 325 mg | ORAL | @ 12:00:00 | Stop: 2022-10-03

## 2022-10-03 MED ADMIN — empagliflozin (JARDIANCE) tablet 25 mg: 25 mg | ORAL | @ 12:00:00 | Stop: 2022-10-03

## 2022-10-03 MED ADMIN — insulin lispro (HumaLOG) injection 0-20 Units: 0-20 [IU] | SUBCUTANEOUS | @ 12:00:00 | Stop: 2022-10-03

## 2022-10-03 MED ADMIN — insulin lispro (HumaLOG) injection 0-20 Units: 0-20 [IU] | SUBCUTANEOUS | @ 03:00:00

## 2022-10-03 MED ADMIN — hydrocortisone 1 % cream: TOPICAL | @ 12:00:00 | Stop: 2022-10-03

## 2022-10-03 MED ADMIN — hydrocortisone 1 % cream: TOPICAL

## 2022-10-03 MED ADMIN — sertraline (ZOLOFT) tablet 50 mg: 50 mg | ORAL | @ 12:00:00 | Stop: 2022-10-03

## 2022-10-03 MED ADMIN — tacrolimus (PROGRAF) capsule 3 mg: 3 mg | ORAL

## 2022-10-03 MED ADMIN — levothyroxine (SYNTHROID) tablet 175 mcg: 175 ug | ORAL | @ 12:00:00 | Stop: 2022-10-03

## 2022-10-03 MED ADMIN — tacrolimus (PROGRAF) capsule 3 mg: 3 mg | ORAL | @ 13:00:00 | Stop: 2022-10-03

## 2022-10-03 MED ADMIN — predniSONE (DELTASONE) tablet 40 mg: 40 mg | ORAL | @ 12:00:00 | Stop: 2022-10-03

## 2022-10-03 NOTE — Unmapped (Signed)
Physician Discharge Summary [facility]  [address] [facility]  [address]  [place] [address]  Dept: [phone]  Loc: [phone]     Identifying Information:   Anthony Alvarado  January 30, 1961  [id number]    Primary Care Physician: [name], MD     Code Status: Full Code    Admit Date: 09/26/2022    Discharge Date: 10/03/2022     Discharge To: Home    Discharge Service: [facility] - General Medicine Floor Team (MED U - Tower)     Discharge Attending Physician: No att. providers found    Discharge Diagnoses:   Principal Problem:    Colitis (POA: Yes)  Active Problems:    Liver transplant recipient (CMS-HCC) (POA: Not Applicable)    Diabetes mellitus without complication (CMS-HCC) (POA: Yes)    Hypothyroidism (POA: Yes)    OSA (obstructive sleep apnea) (POA: Yes)    Essential hypertension (POA: Yes)  Resolved Problems:    * No resolved hospital problems. [facility] Course:     Recurrent colitis presumably due to CellCept versus IBD   Persistent diarrhea, now bloody    Patient had recent hospitalization at [facility] for persistent and chronic diarrhea.  Diarrhea started around April. He had a CT abdomen in June demonstrating moderate severity descending and sigmoid colitis and was treated with multiple courses of antibiotics without improvement. C. difficile, GIPP has had remain negative. EBV, CMV and HSV also negative.  Despite treatment with antibiotics, diarrhea had persisted.  He had colonoscopy on 7/17 which showed severe mucosal changes throughout the colon and pathology showed active colitis with ulceration with differential including infectious etiology versus drug induced versus inflammatory bowel disease, with mycophenolate induced colitis versus inflammatory bowel disease thought to be more likely.  Patient was started on IV steroids and IV infliximab 10 mg/kg daily(first dose 8/3).  Does have known hemorrhoids. On 8/3, hemoglobin found to be 6.6 and patient received 1 unit packed red blood cells and was responsive to fluids. Since receiving first dose of infliximab and starting steroids, patient notes improvement in diarrhea (5 episodes on 8/3, previously >10 episodes). Patient was recovering and started on 40mg  PO prednisone on 8/6. He received IV iron. On 8/8 patient had multiple episodes of diarrhea with a significant amount of blood. He had a flex sigmoidoscopy on 8/9 that showed hemorrhoids. His hemoglobin dropped to 6.8 requiring a unit of PRBC. Patient was no longer having blood in stool at time of discharge. He was discharged with 40mg  prednisone daily until follow up with GI. CI surgery consultation for consideration of hemorrhoidectomy.  Patient started on Dapsone 100mg  for PJP prophylaxis due to hyperkalemia with bactrim.     Cryptogenic cirrhosis status post liver transplant recipient (2020)  Follows with [facility] hepatology. Mycophenolate recently discontinued 7/27 due to concern for mycophenolate induced colitis. No tenderness to palpation on abdominal exam, abdomen is not distended or tense. Mycophenolate discontinued permanently. Tacrolimus increased to 3mg  BID.     Type 2 diabetes mellitus  POCT glucose checks have been elevated into the 300's throughout his hospitalization requiring sliding scale insulin. Increased jardiance 25mg  daily. Advised close follow up with endocrinology to further discuss diabetes management.     Bradycardia   Patient had episode of bradycardia to 49 on 8/5. Asymptomatic. Patient reports usually having HR in 60's. Reported side effect from Infliximab. EKG showed sinus Huston Foley.     The patient's hospital stay has been complicated by the  following clinically significant conditions requiring additional evaluation and treatment or having a significant effect of this patient's care: - Malnutrition POA requiring further investigation, treatment, or monitoring  - Anemia POA requiring further investigation or monitoring  - Hyperkalemia POA requiring further investigation, treatment, or monitoring    Nutrition Assessment:   Non-severe (Moderate) Protein-Calorie Malnutrition in the context of chronic illness (09/29/22 1147)  Interpretation of Wt. Loss: > or equal to 10% x 6 month (20% x 5 months)  Fat Loss: Moderate  Muscle Loss: Moderate  Malnutrition Score: 3    Outpatient Provider Follow Up Issues:   [ ]  Follow up with GI   [ ]  Follow up with GI surgery   [ ]  Continue tacrolimus 3mg  BID  [ ]  Continue Jardiance 25mg  and follow up with endocrinology  [ ]  Continue dapsone 100mg  daily for PJP prophylaxis    Touchbase with Outpatient Provider:  Warm Handoff: Completed on 10/03/22 by Meryl Crutch, DO  (Intern) via Epic Secure Chat    Procedures:  Flexible sigmoidoscopy   ______________________________________________________________________  Discharge Medications:      Your Medication List        STOP taking these medications      FEROCON 110-0.5 mg capsule  Generic drug: ferrous fumarate-b12-vitamic C-folic acid     potassium chloride 20 MEQ ER tablet     sulfamethoxazole-trimethoprim 400-80 mg per tablet  Commonly known as: BACTRIM            START taking these medications      dapsone 100 MG tablet  Take 1 tablet (100 mg total) by mouth daily.  Start taking on: October 04, 2022            CHANGE how you take these medications      empagliflozin 25 mg tablet  Commonly known as: JARDIANCE  Take 1 tablet (25 mg total) by mouth daily.  What changed:   medication strength  See the new instructions.     hydrocortisone 1 % cream  Apply topically to hemorrhoids up to twice a day  What changed:   how much to take  how to take this  when to take this  reasons to take this  additional instructions     predniSONE 20 MG tablet  Commonly known as: DELTASONE  Take 2 tablets (40 mg total) by mouth in the morning.  Start taking on: October 04, 2022  What changed:   medication strength  See the new instructions.     tacrolimus 1 MG capsule  Commonly known as: PROGRAF  Take 3 capsules (3 mg total) by mouth in the morning.  What changed:   how much to take  when to take this     tacrolimus 1 MG capsule  Commonly known as: PROGRAF  Take 3 capsules (3 mg total) by mouth two (2) times a day.  What changed: You were already taking a medication with the same name, and this prescription was added. Make sure you understand how and when to take each.            CONTINUE taking these medications      aspirin 325 MG tablet  Take 1 tablet (325 mg total) by mouth daily.     azelastine 137 mcg (0.1 %) nasal spray  Commonly known as: ASTELIN  1 spray into each nostril daily as needed.     calcium carbonate 200 mg calcium (500 mg) chewable tablet  Commonly known as: Tree surgeon  1 tablet (200 mg of elem calcium total) daily. Pt reports taking every other day.     fluticasone propionate 50 mcg/actuation nasal spray  Commonly known as: FLONASE  PLACE ONE OR TWO SPRAYS INTO BOTH NOSTRILS DAILY AS NEEDED.     levothyroxine 175 MCG tablet  Commonly known as: SYNTHROID  Take 1 tablet (175 mcg total) by mouth daily before breakfast.     metoPROLOL tartrate 25 MG tablet  Commonly known as: Lopressor  Take 1 tablet (25 mg total) by mouth two (2) times a day.     ONETOUCH ULTRA TEST Strp  Generic drug: blood sugar diagnostic  USE TO CHECK SUGAR DAILY     pravastatin 40 MG tablet  Commonly known as: Pravachol  Take 1 tablet (40 mg total) by mouth every evening.     sertraline 50 MG tablet  Commonly known as: ZOLOFT  Take 1 tablet (50 mg total) by mouth daily.              Allergies:  Watermelon flavor  ______________________________________________________________________  Pending Test Results:  Pending Labs       Order Current Status    Quant TB AG1 value In process    Quant TB AG2 value In process    Quant TB Mitogen value In process    Quant TB Nil value In process    Quantiferon TB Gold Plus In process    Quantiferon TB Gold Plus In process            Most Recent Labs:  All lab results last 24 hours -   Recent Results (from the past 24 hour(s))   POCT Glucose    Collection Time: 10/02/22  6:30 PM   Result Value Ref Range    Glucose, POC 219 (H) 70 - 179 mg/dL   POCT Glucose    Collection Time: 10/02/22 10:27 PM   Result Value Ref Range    Glucose, POC 323 (H) 70 - 179 mg/dL   POCT Glucose    Collection Time: 10/03/22  7:59 AM   Result Value Ref Range    Glucose, POC 171 70 - 179 mg/dL   C-reactive protein    Collection Time: 10/03/22  8:52 AM   Result Value Ref Range    CRP 22.0 (H) <=10.0 mg/L   CBC    Collection Time: 10/03/22  8:52 AM   Result Value Ref Range    WBC 10.6 3.6 - 11.2 10*9/L    RBC 2.80 (L) 4.26 - 5.60 10*12/L    HGB 8.5 (L) 12.9 - 16.5 g/dL    HCT 16.1 (L) 09.6 - 48.0 %    MCV 89.9 77.6 - 95.7 fL    MCH 30.4 25.9 - 32.4 pg    MCHC 33.8 32.0 - 36.0 g/dL    RDW 04.5 (H) 40.9 - 15.2 %    MPV 6.4 (L) 6.8 - 10.7 fL    Platelet 430 150 - 450 10*9/L   Basic Metabolic Panel    Collection Time: 10/03/22  8:52 AM   Result Value Ref Range    Sodium 134 (L) 135 - 145 mmol/L    Potassium 5.1 (H) 3.4 - 4.8 mmol/L    Chloride 104 98 - 107 mmol/L    CO2 24.0 20.0 - 31.0 mmol/L    Anion Gap 6 5 - 14 mmol/L    BUN 19 9 - 23 mg/dL    Creatinine 8.11 9.14 - 1.18 mg/dL    BUN/Creatinine  Ratio 17     eGFR CKD-EPI (2021) Male 4 >=60 mL/min/1.61m2    Glucose 147 70 - 179 mg/dL    Calcium 8.8 8.7 - 16.1 mg/dL   Type and Screen    Collection Time: 10/03/22  8:52 AM   Result Value Ref Range    Antibody Screen NEG     Blood Type AB POS    POCT Glucose    Collection Time: 10/03/22 12:00 PM   Result Value Ref Range    Glucose, POC 283 (H) 70 - 179 mg/dL   CBC    Collection Time: 10/03/22  3:22 PM   Result Value Ref Range    WBC 10.5 3.6 - 11.2 10*9/L    RBC 2.70 (L) 4.26 - 5.60 10*12/L    HGB 8.1 (L) 12.9 - 16.5 g/dL    HCT 09.6 (L) 04.5 - 48.0 %    MCV 89.1 77.6 - 95.7 fL    MCH 30.2 25.9 - 32.4 pg    MCHC 33.8 32.0 - 36.0 g/dL    RDW 40.9 (H) 81.1 - 15.2 %    MPV 6.8 6.8 - 10.7 fL    Platelet 412 150 - 450 10*9/L       Relevant Studies/Radiology:  Flexible Sigmoidoscopy    Result Date: 10/02/2022  _______________________________________________________________________________ Patient Name: Anthony Alvarado            Procedure Date: 10/02/2022 12:17 PM MRN: [id number]                     Date of Birth: 18-Jan-1961 Admit Type: Inpatient                 Age: 22 Room: [facility] OR 04 [facility]          Gender: Male Note Status: Finalized                Instrument Name: [id number] _______________________________________________________________________________  Procedure:             Flexible Sigmoidoscopy Indications:           Hematochezia Providers:             Cira Servant, DO, Amedeo Plenty, RN,                        DANIEL P. HUFFMAN, Bernita Buffy (Fellow) Referring MD:          Mauro Kaufmann (Referring MD) Medicines:             Propofol per Anesthesia Complications:         No immediate complications. _______________________________________________________________________________ Procedure:             After obtaining informed consent, the scope was passed                        under direct vision. The Endoscope was introduced                        through the anus and advanced to the the sigmoid                        colon. The patient tolerated the procedure well. The                        flexible sigmoidoscopy was accomplished without  difficulty. The quality of the bowel preparation was                        good.                                                                                Findings:      Hemorrhoids were found on perianal exam.      Solid stool was found in the sigmoid colon, precluding visualization.      The rectum, recto-sigmoid colon and sigmoid colon otherwise appeared      normal.                                                                                Estimated Blood Loss:      Estimated blood loss: none. Impression:            - External hemorrhoids found on perianal exam. - The rectum, sigmoid colon and recto-sigmoid colon                        were otherwise normal.                        - Stool in the sigmoid colon.                        - No specimens collected. Recommendation:        - Return patient to hospital ward for ongoing care.                        - Resume regular diet today.                        - Continue present medications.                                                                                Procedure Code(s):     --- Professional ---                        [phone], Sigmoidoscopy, flexible; diagnostic, including                        collection of specimen(s) by brushing or washing, when  performed (separate procedure) Diagnosis Code(s):     --- Professional ---                        K64.9, Unspecified hemorrhoids                        K92.1, Melena (includes Hematochezia)     CPT copyright 2023 American Medical Association. All rights reserved.     The codes documented in this report are preliminary and upon coder review may be revised to meet current compliance requirements. Attending Participation:      I was present and participated during the entire procedure, including      non-key portions.                                                                                    Electronically signed by Steward Ros, DO ____________________________ Cira Servant, DO 10/02/2022 1:58:09 PM The attending physician was present throughout the entire procedure including the insertion, viewing, and removal of the endoscope. This procedure note has been electronically signed by: Steward Ros , DO     ___________________ Bernita Buffy, Number of Addenda: 0     Note Initiated On: 10/02/2022 12:17 PM    ECG 12 Lead    Result Date: 09/29/2022  NORMAL SINUS RHYTHM NONSPECIFIC ST ABNORMALITY ABNORMAL ECG WHEN COMPARED WITH ECG OF 05-Jan-2019 19:19, NONSPECIFIC T WAVE ABNORMALITY, IMPROVED IN INFERIOR LEADS NONSPECIFIC T WAVE ABNORMALITY NO LONGER EVIDENT IN ANTEROLATERAL LEADS Confirmed by Warnell Forester ([id number]) on 09/29/2022 4:35:38 PM   ______________________________________________________________________  Discharge Instructions:                Follow Up instructions and Outpatient Referrals     Ambulatory Referral to Gastroenterology      Reason for referral: Recurrent Colitis with recent hospitilization    Ambulatory Referral to Physical Therapy      Suggest Treatment: Evaluation with suggestions for treatment    Reason for referral: Hospitalized for colitis; referral for   deconditioning    Requested follow up plan: You would evaluate and manage.    Ambulatory Referral to Gastroenterology Surgery      Reason for referral: hemorrhoidectomy    Requested follow up plan: You would evaluate and manage.    Call MD for:  difficulty breathing, headache or visual disturbances      Call MD for:  extreme fatigue      Call MD for:  persistent dizziness or light-headedness      Call MD for:  persistent nausea or vomiting      Call MD for:  redness, tenderness, or signs of infection (pain, swelling,   redness, odor or green/yellow discharge around incision site)      Call MD for:  severe uncontrolled pain      Call MD for: Temperature > 38.5 Celsius ( > 101.3 Fahrenheit)      Discharge instructions          Appointments which have been scheduled for you      Oct 19, 2022 1:00 PM  (Arrive by 12:45 PM)  RETURN  DIABETES  with Rocco Serene, MD  [facility] DIABETES AND ENDOCRINOLOGY [place] [facility] REGION) [address]  [facility] [address]  [place] [address]  [phone]        Nov 20, 2022 8:00 AM  (Arrive by 7:45 AM)  RETURN  GENERAL with Pamalee Leyden, MD  [facility] GI [facility] REGION) [address]  [address] Floor  [place] [address]  [phone]             ______________________________________________________________________  Discharge Day Services:  BP 116/73  - Pulse 84  - Temp 36.5 ??C (97.7 ??F) (Oral)  - Resp 18  - Ht 175.3 cm (5' 9)  - Wt 69.9 kg (154 lb)  - SpO2 95%  - BMI 22.74 kg/m??     Pt seen on the day of discharge and determined appropriate for discharge.    Condition at Discharge: good    Length of Discharge: I spent greater than 30 mins in the discharge of this patient.    Teddy Spike, DO PGY1

## 2022-10-03 NOTE — Unmapped (Signed)
Patient alert and oriented x 4, denies any pain or distress.  Patient ambulates well in room, no falls this shift. 2 enemas were given prior to GI procedures.   VSS.  Patient updated on plan of care with med team, and verbalized understanding. One unit od PRBCs given. Bed wheels locked and placed in lowest position.   Patient resting at this time,no needs voiced.      Problem: Adult Inpatient Plan of Care  Goal: Plan of Care Review  Outcome: Ongoing - Unchanged  Flowsheets (Taken 10/02/2022 1715)  Progress: improving  Outcome Evaluation: Patient will remain free from falls this shift.  Plan of Care Reviewed With: patient  Goal: Patient-Specific Goal (Individualized)  Outcome: Ongoing - Unchanged  Goal: Absence of Hospital-Acquired Illness or Injury  Outcome: Ongoing - Unchanged  Intervention: Identify and Manage Fall Risk  Recent Flowsheet Documentation  Taken 10/02/2022 0830 by Darrold Junker, RN  Safety Interventions: fall reduction program maintained  Intervention: Prevent Skin Injury  Recent Flowsheet Documentation  Taken 10/02/2022 0830 by Darrold Junker, RN  Positioning for Skin: Supine/Back  Intervention: Prevent and Manage VTE (Venous Thromboembolism) Risk  Recent Flowsheet Documentation  Taken 10/02/2022 1630 by Holloway-Shambo, Gerhard Munch, RN  Anti-Embolism Device Type: SCD, Knee  Anti-Embolism Intervention: Refused  Anti-Embolism Device Location: BLE  Taken 10/02/2022 1430 by Holloway-Shambo, Gerhard Munch, RN  Anti-Embolism Device Type: SCD, Knee  Anti-Embolism Intervention: Refused  Anti-Embolism Device Location: BLE  Taken 10/02/2022 1030 by Holloway-Shambo, Gerhard Munch, RN  Anti-Embolism Device Type: SCD, Knee  Anti-Embolism Intervention: Refused  Anti-Embolism Device Location: BLE  Taken 10/02/2022 0830 by [employer], Gerhard Munch, RN  Anti-Embolism Device Type: SCD, Knee  Anti-Embolism Intervention: Refused  Anti-Embolism Device Location: BLE  Goal: Optimal Comfort and Wellbeing  Outcome: Ongoing - Unchanged  Goal: Readiness for Transition of Care  Outcome: Ongoing - Unchanged  Goal: Rounds/Family Conference  Outcome: Ongoing - Unchanged     Problem: Wound  Goal: Optimal Coping  Outcome: Ongoing - Unchanged  Goal: Optimal Functional Ability  Outcome: Ongoing - Unchanged  Goal: Absence of Infection Signs and Symptoms  Outcome: Ongoing - Unchanged  Goal: Improved Oral Intake  Outcome: Ongoing - Unchanged  Goal: Optimal Pain Control and Function  Outcome: Ongoing - Unchanged  Goal: Skin Health and Integrity  Outcome: Ongoing - Unchanged  Intervention: Optimize Skin Protection  Recent Flowsheet Documentation  Taken 10/02/2022 0830 by Holloway-Shambo, Gerhard Munch, RN  Pressure Reduction Techniques: frequent weight shift encouraged  Pressure Reduction Devices: pressure-redistributing mattress utilized  Goal: Optimal Wound Healing  Outcome: Ongoing - Unchanged     Problem: Fall Injury Risk  Goal: Absence of Fall and Fall-Related Injury  Outcome: Ongoing - Unchanged  Intervention: Promote Scientist, clinical (histocompatibility and immunogenetics) Documentation  Taken 10/02/2022 0830 by [place], RN  Safety Interventions: fall reduction program maintained     Problem: Malnutrition  Goal: Improved Nutritional Intake  Outcome: Ongoing - Unchanged     Problem: Pain Acute  Goal: Optimal Pain Control and Function  Outcome: Ongoing - Unchanged

## 2022-10-03 NOTE — Unmapped (Signed)
Pt remained A & O x 4 and able to express needs . VSS , Afebrile , Room air . Pt voiced no complaints , denied pain 0/10 , no N/V , no SOB/dyspnea . No bleeding noted as of yet . Assistance with ADLS provided . Pt OOB to BR , steady gait . Falls and safety precautions reinforced with bed low and locked , SR up x 3 , call bell within reach . Pt resting quietly in bed  with eyes closed . No s/sx of acute respiratory or cardiac distress noted . Will continue with current POC .    Problem: Adult Inpatient Plan of Care  Goal: Plan of Care Review  Outcome: Progressing  Goal: Patient-Specific Goal (Individualized)  Outcome: Progressing  Flowsheets (Taken 10/03/2022 0650)  Patient/Family-Specific Goals (Include Timeframe): Pt will remain hemodynamically stable and free from falls and injuries during this shift 7p - 7a .  Individualized Care Needs: V/S ,labs , ACHS , pain mgmt , COnt 02 monitor , BIPAP , bleeding precautions , falls/safety precautions .  Anxieties, Fears or Concerns: Pt voiced no ocncerns .  Goal: Absence of Hospital-Acquired Illness or Injury  Outcome: Progressing  Intervention: Identify and Manage Fall Risk  Recent Flowsheet Documentation  Taken 10/03/2022 0600 by Harlow Ohms, RN  Safety Interventions:   fall reduction program maintained   environmental modification   low bed   nonskid shoes/slippers when out of bed   room near unit station  Taken 10/03/2022 0400 by Harlow Ohms, RN  Safety Interventions:   fall reduction program maintained   environmental modification   low bed   nonskid shoes/slippers when out of bed   room near unit station  Taken 10/03/2022 0200 by Harlow Ohms, RN  Safety Interventions:   fall reduction program maintained   environmental modification   low bed   nonskid shoes/slippers when out of bed  Taken 10/03/2022 0000 by Harlow Ohms, RN  Safety Interventions:   fall reduction program maintained   environmental modification   low bed   nonskid shoes/slippers when out of bed   room near unit station  Taken 10/02/2022 2200 by Harlow Ohms, RN  Safety Interventions:   fall reduction program maintained   environmental modification   low bed   nonskid shoes/slippers when out of bed  Taken 10/02/2022 2000 by Harlow Ohms, RN  Safety Interventions:   fall reduction program maintained   environmental modification   low bed   nonskid shoes/slippers when out of bed   room near unit station  Intervention: Prevent Skin Injury  Recent Flowsheet Documentation  Taken 10/02/2022 2000 by Harlow Ohms, RN  Positioning for Skin: Supine/Back  Device Skin Pressure Protection: absorbent pad utilized/changed  Skin Protection: incontinence pads utilized  Intervention: Prevent and Manage VTE (Venous Thromboembolism) Risk  Recent Flowsheet Documentation  Taken 10/03/2022 0600 by Annamaria Helling A, RN  Anti-Embolism Device Type: SCD, Knee  Anti-Embolism Intervention: Refused  Anti-Embolism Device Location: BLE  Taken 10/03/2022 0400 by Annamaria Helling A, RN  Anti-Embolism Device Type: SCD, Knee  Anti-Embolism Intervention: Refused  Anti-Embolism Device Location: BLE  Taken 10/03/2022 0200 by Annamaria Helling A, RN  Anti-Embolism Device Type: SCD, Knee  Anti-Embolism Intervention: Refused  Anti-Embolism Device Location: BLE  Taken 10/03/2022 0000 by Harlow Ohms, RN  Anti-Embolism Device Type: SCD, Knee  Anti-Embolism Intervention: Refused  Anti-Embolism Device Location: BLE  Taken 10/02/2022 2200 by Harlow Ohms, RN  Anti-Embolism Device Type: SCD,  Knee  Anti-Embolism Intervention: Refused  Anti-Embolism Device Location: BLE  Taken 10/02/2022 2000 by Harlow Ohms, RN  VTE Prevention/Management: anticoagulant therapy  Anti-Embolism Device Type: SCD, Knee  Anti-Embolism Intervention: Refused  Anti-Embolism Device Location: BLE  Intervention: Prevent Infection  Recent Flowsheet Documentation  Taken 10/03/2022 0600 by Harlow Ohms, RN  Infection Prevention: rest/sleep promoted  Taken 10/03/2022 0400 by Harlow Ohms, RN  Infection Prevention: rest/sleep promoted  Taken 10/03/2022 0200 by Harlow Ohms, RN  Infection Prevention: rest/sleep promoted  Taken 10/03/2022 0000 by Harlow Ohms, RN  Infection Prevention: rest/sleep promoted  Taken 10/02/2022 2200 by Harlow Ohms, RN  Infection Prevention: environmental surveillance performed  Taken 10/02/2022 2000 by Harlow Ohms, RN  Infection Prevention: environmental surveillance performed  Goal: Optimal Comfort and Wellbeing  Outcome: Progressing  Goal: Readiness for Transition of Care  Outcome: Progressing  Goal: Rounds/Family Conference  Outcome: Progressing

## 2022-10-03 NOTE — Unmapped (Signed)
Internal Medicine (MEDU) Progress Note    Assessment & Plan:   62 y.o. 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    Principal Problem:    Colitis  Active Problems:    Liver transplant recipient (CMS-HCC)    Diabetes mellitus without complication (CMS-HCC)    Hypothyroidism    OSA (obstructive sleep apnea)    Essential hypertension  Resolved Problems:    * No resolved hospital problems. *    Active Problems    Recurrent colitis presumably due to CellCept versus IBD   Persistent diarrhea - improved  Acute blood loss anemia   Diarrhea initially started 05/2022. He had colonoscopy on 7/17 which showed severe mucosal changes throughout the colon and pathology showed active colitis with ulceration with differential including infectious etiology versus drug induced versus inflammatory bowel disease.  Flex sig at OSH w/ similar findings and CMV-. Most likely IBD, though cannot r/o mycophenolate associated colitis. s/p infliximab high dose 8/3 with symptomatic improvement.  On 8/8 patient had multiple episodes of diarrhea with a significant amount of blood and underwent flex sigmoidoscopy demonstrating no signs of colitis but patients' known external hemorrhoids with bleding  - GI consulted, following   - Continue 40mg  Prednisone PO  -- Preparation H for external; witch hazel wipes  -- Referral to GI surgery for consideration of hemorrhoidectomy  -- Cellcept permanently discontinued per GI  -- Follow up on Hgb on 8/9  -- S/p 1 unit blood for hb 6.8 this AM  -- S/p IV iron   -- Continue aspirin and DVT ppx  -- Quantiferon Gold indeterminate -- follow up on repeat (8/8)  -- Replete magnesium as needed.     Hyperkalemia - resolved  Potassium normalized to 4.7 on 8/8. Restarted bactrim. Continue to monitor.     Bradycardia   Patient had episode of bradycardia to 49 on 8/5. Asymptomatic. Patient reports usually having HR in 60's. Reported side effect from Infliximab.   - EKG showed sinus Huston Foley.   - Continue to monitor.     Type 2 diabetes mellitus-   POCT glucose checks have been elevated into the 300's throughout his hospitalization requiring sliding scale insulin.   -- Increased jardiance 25mg  daily.   -- continue SSI during hospitalization     Cryptogenic cirrhosis status post liver transplant recipient (2020)  Mycophenolate recently discontinued 7/27 due to concern for mycophenolate induced colitis. No tenderness to palpation on abdominal exam, abdomen is not distended or tense.Tacrolimus trough subtherapeutic on 8/6.   - Increased dose to 2mg  AM and 3mg  PM (8/6)  -- Tac trough daily     Aortic stenosis status post AVR aortic valve replacement  Bioprosthetic aortic valve placed in 2023.  Not on anticoagulation.  Currently holding aspirin in setting of recent bleeding but need to restart soon when safe.  - Restart aspirin 325 mg daily     CAD status post CABG  Last echo in 2023 with left ejection fraction of 55 to 60% and functioning aortic heart valve.  - On telemetry   - Restart aspirin as above  - Restart metoprolol tartrate 25 mg twice daily    Chronic Problems  Hyperlipidemia-Continue prvastiatin 40mg  daily   OSA-Continue nighttime CPAP   Hypertension- continue metoprol tartrate 25mg   BID,   Hypothyroidism-Continuej levothyroxine 137.  MDD: Continue sertraline 50mg  daily       Malnutrition Assessment using AND/ASPEN or GLIM Clinical Characteristics:   Non-severe (Moderate) Protein-Calorie Malnutrition in the context of chronic illness (09/29/22 1147)  Interpretation of Wt. Loss: > or equal to 10% x 6 month (20% x 5 months)  Fat Loss: Moderate  Muscle Loss: Moderate  Malnutrition Score: 3    Checklist:  Diet: Regular Diet  DVT PPx: SCDs  Code Status: Full Code  Dispo: Patient appropriate for Inpatient based on expectation of ongoing need for hospitalization greater than two midnights based on severity of presentation/services including bloody diarrhea requiring IVF and daily labs.    Team Contact Information:   Primary Team: Internal Medicine (MEDU)  Primary Resident: Jacalyn Lefevre, MD  Resident's Pager: [phone] (Gen MedU Intern - Alvester Morin)    Interval history:   Underwent flex sig demonstrating no signs of colitis however bleeding external hemorrhoids. Continued to have episodes of bloody BM today but diarrhea has otherwise improved.    Objective:   Physical Exam:  Temp:  [36.2 ??C (97.2 ??F)-36.9 ??C (98.4 ??F)] 36.7 ??C (98.1 ??F)  Heart Rate:  [66-86] 86  SpO2 Pulse:  [66-74] 66  Resp:  [14-20] 18  BP: (88-125)/(51-74) 99/72  FiO2 (%):  [21 %] 21 %  SpO2:  [99 %-100 %] 100 %    Gen: NAD, converses clearly.   Heart: RRR  Lungs: Comfortably breathing on RA  Abdomen: Non-distended  Psych: Alert, oriented    Salli Real MD  Internal Medicine/ Pediatrics PGY-2  Pager: [phone]

## 2022-10-03 NOTE — Unmapped (Signed)
Pt alert and oriented x 4.Up ad lib in the hallway.Tolerated activity well.No complaints of pain.H&H WNL.No bloody BM reported thus far.Blood sugar fluctuates.Pt on prednisone.Remains on correctional insulin and Jardiance.Updated on plan of care by MD.No complaints at this time.Up in chair with no further needs voiced.VSS.Call bell within reach.plan of care continues.  Problem: Adult Inpatient Plan of Care  Goal: Plan of Care Review  Outcome: Progressing  Flowsheets (Taken 10/03/2022 1243)  Progress: improving  Plan of Care Reviewed With: patient  Goal: Patient-Specific Goal (Individualized)  Outcome: Progressing  Flowsheets (Taken 10/03/2022 1243)  Patient/Family-Specific Goals (Include Timeframe): Pt will remain hemodynamically stable during this shift 7a-7p  Individualized Care Needs: achs,monitor labs,v/s,monitor for bleeding,BIPAP,  Anxieties, Fears or Concerns: none voiced  Goal: Absence of Hospital-Acquired Illness or Injury  Outcome: Progressing  Intervention: Identify and Manage Fall Risk  Recent Flowsheet Documentation  Taken 10/03/2022 1200 by Ileene Rubens, RN  Safety Interventions: environmental modification  Taken 10/03/2022 1000 by Ileene Rubens, RN  Safety Interventions: environmental modification  Taken 10/03/2022 0730 by Ileene Rubens, RN  Safety Interventions: environmental modification  Intervention: Prevent and Manage VTE (Venous Thromboembolism) Risk  Recent Flowsheet Documentation  Taken 10/03/2022 1200 by Ileene Rubens, RN  Anti-Embolism Intervention: (bleeding risk) Other (Comment)  Taken 10/03/2022 1000 by Ileene Rubens, RN  Anti-Embolism Intervention: (bleeding risk) Other (Comment)  Taken 10/03/2022 0730 by Ileene Rubens, RN  Anti-Embolism Intervention: (bleeding risk) Other (Comment)  Goal: Optimal Comfort and Wellbeing  Outcome: Progressing  Intervention: Monitor Pain and Promote Comfort  Recent Flowsheet Documentation  Taken 10/03/2022 1243 by Ileene Rubens, RN  Pain Management Interventions: pain management plan reviewed with patient/caregiver  Goal: Readiness for Transition of Care  Outcome: Progressing  Goal: Rounds/Family Conference  Outcome: Progressing  Flowsheets (Taken 10/03/2022 1243)  Participants:   nursing   patient   physician

## 2022-10-04 MED ORDER — DAPSONE 100 MG TABLET
ORAL_TABLET | Freq: Every day | ORAL | 2 refills | 30.00000 days | Status: CP
Start: 2022-10-04 — End: 2022-10-03
  Filled 2022-10-03: qty 30, 30d supply, fill #0

## 2022-10-04 MED ORDER — PREDNISONE 20 MG TABLET
ORAL_TABLET | Freq: Every day | ORAL | 0 refills | 30.00000 days | Status: CP
Start: 2022-10-04 — End: 2022-11-03
  Filled 2022-10-08: qty 60, 30d supply, fill #0

## 2022-10-05 ENCOUNTER — Encounter: Payer: Self-pay | Admitting: Family Medicine

## 2022-10-05 ENCOUNTER — Telehealth: Payer: Self-pay

## 2022-10-05 DIAGNOSIS — Z5181 Encounter for therapeutic drug level monitoring: Principal | ICD-10-CM

## 2022-10-05 DIAGNOSIS — E612 Magnesium deficiency: Principal | ICD-10-CM

## 2022-10-05 DIAGNOSIS — Z944 Liver transplant status: Principal | ICD-10-CM

## 2022-10-05 NOTE — Unmapped (Signed)
[facility] Refill Coordination Note    Specialty Medication(s) to be Shipped:   Transplant: tacrolimus 1mg  and Prednisone 20mg     Other medication(s) to be shipped: No additional medications requested for fill at this time     [name], DOB: 1960/04/11  Phone: [phone] (home)       All above HIPAA information was verified with patient.     Was a Nurse, learning disability used for this call? No    Completed refill call assessment today to schedule patient's medication shipment from the [facility] [phone]).  All relevant notes have been reviewed.     Specialty medication(s) and dose(s) confirmed: Regimen is correct and unchanged.   Changes to medications: [name] reports no changes at this time.  Changes to insurance: No  New side effects reported not previously addressed with a pharmacist or physician: None reported  Questions for the pharmacist: No    Confirmed patient received a Conservation officer, historic buildings and a Surveyor, mining with first shipment. The patient will receive a drug information handout for each medication shipped and additional FDA Medication Guides as required.       DISEASE/MEDICATION-SPECIFIC INFORMATION        N/A    SPECIALTY MEDICATION ADHERENCE     Medication Adherence    Patient reported X missed doses in the last month: 0  Specialty Medication: tacrolimus 1 MG capsule (PROGRAF)  Patient is on additional specialty medications: No              Were doses missed due to medication being on hold? No      tacrolimus 1 MG capsule (PROGRAF): 7 days of medicine on hand     predniSONE 20 MG tablet (DELTASONE): 7 days of medicine on hand       REFERRAL TO PHARMACIST     Referral to the pharmacist: Not needed      [facility]     Shipping address confirmed in Epic.       Delivery Scheduled: Yes, Expected medication delivery date: 10/08/2022.     Medication will be delivered via UPS to the prescription address in Epic [place].    Albertine Lafoy J Helane Gunther   [facility] Specialty Technician

## 2022-10-05 NOTE — Transitions of Care (Post Inpatient/ED Visit) (Unsigned)
   10/05/2022  Name: Montero Monce MRN: [id number] DOB: 1960/07/06  Today's TOC FU Call Status: Today's TOC FU Call Status:: Unsuccessful Call (1st Attempt) Unsuccessful Call (1st Attempt) Date: 10/05/22  Attempted to reach the patient regarding the most recent Inpatient/ED visit.  Follow Up Plan: Additional outreach attempts will be made to reach the patient to complete the Transitions of Care (Post Inpatient/ED visit) call.   Signature   Woodfin Ganja LPN [facility] Nurse Health Advisor Direct Dial [phone]

## 2022-10-06 LAB — QUANTIFERON TB GOLD PLUS
QUANTIFERON ANTIGEN 1 MINUS NIL: 0 [IU]/mL
QUANTIFERON ANTIGEN 2 MINUS NIL: 0 [IU]/mL
QUANTIFERON MITOGEN: 2.2 [IU]/mL
QUANTIFERON TB GOLD PLUS: NEGATIVE
QUANTIFERON TB NIL VALUE: 0 [IU]/mL

## 2022-10-06 LAB — TB NIL: TB NIL VALUE: 0

## 2022-10-06 LAB — TB MITOGEN: TB MITOGEN VALUE: 2.2

## 2022-10-06 LAB — TB AG1: TB AG1 VALUE: 0

## 2022-10-06 LAB — TB AG2: TB AG2 VALUE: 0

## 2022-10-06 NOTE — Unmapped (Addendum)
[facility] Pharmacist has reviewed a new prescription for tacrolimus that indicates a dose increase.  Patient was counseled on this dosage change by provider ES- see epic note from 8/10.  New rx already added into existing work order.        Clinical Assessment Needed For: Dose Change  Medication: Tacrolimus 1 mg capsules  Last Fill Date/Day Supply: 08/24/22 / 30  Copay $6  Was previous dose already scheduled to fill: Yes    Notes to Pharmacist: Scheduled to fill tomorrow

## 2022-10-06 NOTE — Transitions of Care (Post Inpatient/ED Visit) (Deleted)
   10/06/2022  Name: [name] MRN: [id number] DOB: August 25, 1960  {[id number]}

## 2022-10-06 NOTE — Transitions of Care (Post Inpatient/ED Visit) (Signed)
10/06/2022  Name: [name] MRN: [id number] DOB: [birth year]  Today's TOC FU Call Status: Today's TOC FU Call Status:: Successful TOC FU Call Completed Unsuccessful Call (1st Attempt) Date: 10/05/22 [facility] FU Call Complete Date: 10/06/22  Transition Care Management Follow-up Telephone Call Date of Discharge: 10/03/22 Discharge Facility: Other ([facility]) Name of Other ([facility]) Discharge Facility: [facility] Type of Discharge: Inpatient Admission Primary Inpatient Discharge Diagnosis:: Bleeding external hemorrhoids How have you been since you were released from the hospital?: Better Any questions or concerns?: No  Items Reviewed: Did you receive and understand the discharge instructions provided?: Yes Medications obtained,verified, and reconciled?: Yes (Medications Reviewed) Any new allergies since your discharge?: No Dietary orders reviewed?: Yes Do you have support at home?: Yes  Medications Reviewed Today: Medications Reviewed Today     Reviewed by Merleen Nicely, LPN (Licensed Practical Nurse) on 10/06/22 at 1134  Med List Status: <None>   Medication Order Taking? Sig Documenting Provider Last Dose Status Informant  acetaminophen (TYLENOL) 500 MG tablet [id number] Yes Take 500 mg by mouth every 6 (six) hours as needed for moderate pain or headache. [provider] Taking Active Self, Pharmacy Records  albuterol (VENTOLIN HFA) 108 (90 Base) MCG/ACT inhaler [id number] Yes Inhale 2 puffs into the lungs every 6 (six) hours as needed for wheezing or shortness of breath. [provider] Taking Active Self, Pharmacy Records  alendronate (FOSAMAX) 70 MG tablet [id number] No Take 1 tablet (70 mg total) by mouth every Sunday. Take with a full glass of water on an empty stomach.  Patient not taking: Reported on 09/22/2022   Joaquim Nam, MD Not Taking Active Self, Pharmacy Records  aspirin EC 325 MG EC tablet [id number] Yes Take 1 tablet (325 mg total) by  mouth daily. Rowe Clack, PA-C Taking Active Self, Pharmacy Records  azelastine (ASTELIN) 0.1 % nasal spray [id number] No PLACE 2 SPRAYS INTO BOTH NOSTRILS TWICE DAILY AS NEEDED  Patient not taking: Reported on 09/22/2022   Joaquim Nam, MD Not Taking Active Self, Pharmacy Records  calcium carbonate (TUMS - DOSED IN MG ELEMENTAL CALCIUM) 500 MG chewable tablet [id number] Yes Chew 1 tablet by mouth daily. [provider] Taking Active Self, Pharmacy Records  Cholecalciferol (VITAMIN D) 50 MCG (2000 UT) tablet [id number] Yes Take 2,000 Units by mouth daily. [provider] Taking Active Self, Pharmacy Records  empagliflozin (JARDIANCE) 10 MG TABS tablet [id number] No Take 1 tablet (10 mg total) by mouth daily before breakfast.  Patient not taking: Reported on 10/06/2022   Joaquim Nam, MD Not Taking Active Self, Pharmacy Records           Med Note Para March, Doreene Burke Jan 01, 2022  9:54 AM)    empagliflozin (JARDIANCE) 25 MG TABS tablet [id number] Yes Take 1 tablet by mouth daily. [provider] Taking Active   fluticasone (FLONASE) 50 MCG/ACT nasal spray [id number] No PLACE ONE OR TWO SPRAYS INTO BOTH NOSTRILS DAILY AS NEEDED.  Patient not taking: Reported on 09/22/2022   Joaquim Nam, MD Not Taking Active Self, Pharmacy Records  GAVILYTE-G 236 g solution [id number] No Take 4,000 mLs by mouth once.  Patient not taking: Reported on 09/22/2022   [provider] Not Taking Active Self, Pharmacy Records  hydrocortisone cream 1 % [id number] Yes Apply to affected area 2 times daily Rexford Maus, DO Taking Active Self, Pharmacy Records  levothyroxine (SYNTHROID) 175 MCG tablet [id number] Yes Take 1 tablet (175 mcg  total) by mouth daily before breakfast. Pricilla Riffle, MD Taking Active Self, Pharmacy Records  metFORMIN (GLUCOPHAGE-XR) 500 MG 24 hr tablet [id number] No Take 2 tablets (1,000 mg total) by mouth every morning AND 2 tablets (1,000 mg total) at  bedtime.  Patient not taking: Reported on 09/22/2022   Joaquim Nam, MD Not Taking Active Self, Pharmacy Records  metoprolol tartrate (LOPRESSOR) 25 MG tablet [id number] Yes Take 1 tablet (25 mg total) by mouth two (2) times a day. Joaquim Nam, MD Taking Active Self, Pharmacy Records  metroNIDAZOLE (METROGEL) 1 % gel [id number] No Apply 1 application topically daily as needed (rosacea).  Patient not taking: Reported on 09/22/2022   [provider] Not Taking Active Self, Pharmacy Records  mycophenolate (CELLCEPT) 250 MG capsule [id number] No Take 250 mg by mouth 2 (two) times daily.  Patient not taking: Reported on 09/22/2022   [provider] Not Taking Active Self, Pharmacy Records  [facility] [id number] Yes Pt uses a cpap nightly [provider] Taking Active Self, Pharmacy Records  ondansetron (ZOFRAN) 4 MG tablet [id number] Yes Take 1 tablet (4 mg total) by mouth every 6 (six) hours. Rexford Maus, DO Taking Active Self, Pharmacy Records  OneTouch [place] Lancets 33G [place] [id number] Yes USE TO CHECK SUGAR DAILY Joaquim Nam, MD Taking Active Self, Pharmacy Records  [facility] ULTRA test strip [id number] Yes USE TO CHECK SUGAR DAILY Joaquim Nam, MD Taking Active Self, Pharmacy Records  potassium chloride SA (KLOR-CON M) 20 MEQ tablet [id number] Yes Take 1 tablet by mouth daily. [provider] Taking Active Self, Pharmacy Records  pravastatin (PRAVACHOL) 40 MG tablet [id number] Yes Take 40 mg by mouth every evening. [provider] Taking Active Self, Pharmacy Records  predniSONE (DELTASONE) 10 MG tablet [id number] Yes Take 5-40 mg by mouth See admin instructions. Take 4 tablets (40 mg total) by mouth daily for 14 days, THEN 3 tablets (30 mg total) daily for 7 days, THEN 2 tablets (20 mg total) daily for 7 days, THEN 1 tablet (10 mg total) daily for 7 days, THEN 0.5 tablets (5 mg total) daily for 7 days. [provider] Taking  Active Self, Pharmacy Records  sertraline (ZOLOFT) 50 MG tablet [id number] Yes Take 1 tablet (50 mg total) by mouth daily. Joaquim Nam, MD Taking Active Self, Pharmacy Records  sulfamethoxazole-trimethoprim (BACTRIM) 400-80 MG tablet [id number] Yes Take 1 tablet by mouth every Monday, Wednesday, and Friday. [provider] Taking Active Self, Pharmacy Records  tacrolimus (PROGRAF) 1 MG capsule [id number] Yes Take 2 mg by mouth 2 (two) times daily. [provider] Taking Active Self, Pharmacy Records            Home Care and Equipment/Supplies: Were Home Health Services Ordered?: No Any new equipment or medical supplies ordered?: No  Functional Questionnaire: Do you need assistance with bathing/showering or dressing?: No Do you need assistance with meal preparation?: No Do you need assistance with eating?: No Do you have difficulty maintaining continence: No Do you need assistance with getting out of bed/getting out of a chair/moving?: No Do you have difficulty managing or taking your medications?: No  Follow up appointments reviewed: PCP Follow-up appointment confirmed?: Yes Date of PCP follow-up appointment?: 10/09/22 Follow-up Provider: Dr Para March [facility] Follow-up appointment confirmed?: Yes Follow-Up Specialty Provider:: [facility] Do you need transportation to your follow-up appointment?: No Do you understand care options if your condition(s) worsen?: Yes-patient verbalized understanding    SIGNATURE  Woodfin Ganja  LPN [facility] Nurse Health Advisor Direct Dial [phone]

## 2022-10-08 DIAGNOSIS — E612 Magnesium deficiency: Secondary | ICD-10-CM | POA: Diagnosis not present

## 2022-10-08 DIAGNOSIS — Z944 Liver transplant status: Secondary | ICD-10-CM | POA: Diagnosis not present

## 2022-10-08 DIAGNOSIS — Z5181 Encounter for therapeutic drug level monitoring: Secondary | ICD-10-CM | POA: Diagnosis not present

## 2022-10-09 ENCOUNTER — Ambulatory Visit (INDEPENDENT_AMBULATORY_CARE_PROVIDER_SITE_OTHER): Payer: Medicare Other | Admitting: Family Medicine

## 2022-10-09 ENCOUNTER — Encounter: Payer: Self-pay | Admitting: Family Medicine

## 2022-10-09 VITALS — BP 128/68 | HR 78 | Temp 97.9°F | Ht 68.0 in | Wt 163.0 lb

## 2022-10-09 DIAGNOSIS — Z944 Liver transplant status: Secondary | ICD-10-CM

## 2022-10-09 DIAGNOSIS — E119 Type 2 diabetes mellitus without complications: Secondary | ICD-10-CM

## 2022-10-09 DIAGNOSIS — M8580 Other specified disorders of bone density and structure, unspecified site: Secondary | ICD-10-CM

## 2022-10-09 DIAGNOSIS — D62 Acute posthemorrhagic anemia: Secondary | ICD-10-CM

## 2022-10-09 LAB — COMPREHENSIVE METABOLIC PANEL
ALBUMIN: 3.5 g/dL — ABNORMAL LOW (ref 3.8–4.9)
ALKALINE PHOSPHATASE: 79 IU/L (ref 44–121)
ALT (SGPT): 26 IU/L (ref 0–44)
AST (SGOT): 15 IU/L (ref 0–40)
BILIRUBIN TOTAL (MG/DL) IN SER/PLAS: 0.2 mg/dL (ref 0.0–1.2)
BLOOD UREA NITROGEN: 25 mg/dL (ref 8–27)
BUN / CREAT RATIO: 27 — ABNORMAL HIGH (ref 10–24)
CALCIUM: 8.9 mg/dL (ref 8.6–10.2)
CHLORIDE: 104 mmol/L (ref 96–106)
CO2: 20 mmol/L (ref 20–29)
CREATININE: 0.91 mg/dL (ref 0.76–1.27)
EGFR: 96 mL/min/{1.73_m2}
GLOBULIN, TOTAL: 2 g/dL (ref 1.5–4.5)
GLUCOSE: 132 mg/dL — ABNORMAL HIGH (ref 70–99)
POTASSIUM: 4.6 mmol/L (ref 3.5–5.2)
SODIUM: 138 mmol/L (ref 134–144)
TOTAL PROTEIN: 5.5 g/dL — ABNORMAL LOW (ref 6.0–8.5)

## 2022-10-09 LAB — MAGNESIUM: MAGNESIUM: 1.7 mg/dL (ref 1.6–2.3)

## 2022-10-09 LAB — BILIRUBIN, DIRECT: BILIRUBIN DIRECT: 0.11 mg/dL (ref 0.00–0.40)

## 2022-10-09 LAB — PHOSPHORUS: PHOSPHORUS, SERUM: 3.3 mg/dL (ref 2.8–4.1)

## 2022-10-09 LAB — GAMMA GT: GAMMA GLUTAMYL TRANSFERASE: 41 IU/L (ref 0–65)

## 2022-10-09 MED ORDER — LINAGLIPTIN 5 MG PO TABS: 5.0000 mg | ORAL_TABLET | Freq: Every day | ORAL | Status: DC

## 2022-10-09 MED ORDER — PREDNISONE 10 MG PO TABS: 40.0000 mg | ORAL_TABLET | Freq: Every day | ORAL | Status: DC

## 2022-10-09 MED ORDER — DAPSONE 100 MG PO TABS: 100.0000 mg | ORAL_TABLET | Freq: Every day | ORAL | Status: DC

## 2022-10-09 MED ORDER — TACROLIMUS 1 MG PO CAPS: 3.0000 mg | ORAL_CAPSULE | Freq: Two times a day (BID) | ORAL | Status: DC

## 2022-10-09 MED ORDER — EMPAGLIFLOZIN 25 MG PO TABS: 25.0000 mg | ORAL_TABLET | Freq: Every day | ORAL | Status: AC

## 2022-10-09 NOTE — Progress Notes (Unsigned)
Assessment & Plan: 62 y.o. 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  Principal Problem: Colitis Active Problems: Liver transplant recipient (CMS-HCC) Diabetes mellitus without complication (CMS-HCC) Hypothyroidism OSA (obstructive sleep apnea) Essential hypertension Resolved Problems: * No resolved hospital problems. *  Active Problems  Recurrent colitis presumably due to CellCept versus IBD Persistent diarrhea - improved Acute blood loss anemia Diarrhea initially started 05/2022. He had colonoscopy on 7/17 which showed severe mucosal changes throughout the colon and pathology showed active colitis with ulceration with differential including infectious etiology versus drug induced versus inflammatory bowel disease. Flex sig at OSH w/ similar findings and CMV-. Most likely IBD, though cannot r/o mycophenolate associated colitis. s/p infliximab high dose 8/3 with symptomatic improvement. On 8/8 patient had multiple episodes of diarrhea with a significant amount of blood and underwent flex sigmoidoscopy demonstrating no signs of colitis but patients' known external hemorrhoids with bleding - GI consulted, following - Continue 40mg  Prednisone PO -- Preparation H for external; witch hazel wipes -- Referral to GI surgery for consideration of hemorrhoidectomy -- Cellcept permanently discontinued per GI -- Follow up on Hgb on 8/9 -- S/p 1 unit blood for hb 6.8 this AM -- S/p IV iron -- Continue aspirin and DVT ppx -- Quantiferon Gold indeterminate -- follow up on repeat (8/8) -- Replete magnesium as needed.  Hyperkalemia - resolved Potassium normalized to 4.7 on 8/8. Restarted bactrim. Continue  to monitor.  Bradycardia Patient had episode of bradycardia to 49 on 8/5. Asymptomatic. Patient reports usually having HR in 60's. Reported side effect from Infliximab. - EKG showed sinus Huston Foley. - Continue to monitor.  Type 2 diabetes mellitus- POCT glucose checks have been elevated into the 300's throughout his hospitalization requiring sliding scale insulin. -- Increased jardiance 25mg  daily. -- continue SSI during hospitalization  Cryptogenic cirrhosis status post liver transplant recipient (2020) Mycophenolate recently discontinued 7/27 due to concern for mycophenolate induced colitis. No tenderness to palpation on abdominal exam, abdomen is not distended or tense.Tacrolimus trough subtherapeutic on 8/6. - Increased dose to 2mg  AM and 3mg  PM (8/6) -- Tac trough daily  Aortic stenosis status post AVR aortic valve replacement Bioprosthetic aortic valve placed in 2023. Not on anticoagulation. Currently holding aspirin in setting of recent bleeding but need to restart soon when safe. - Restart aspirin 325 mg daily  CAD status post CABG Last echo in 2023 with left ejection fraction of 55 to 60% and functioning aortic heart valve. - On telemetry - Restart aspirin as above - Restart metoprolol tartrate 25 mg twice daily  Chronic Problems Hyperlipidemia-Continue prvastiatin 40mg  daily OSA-Continue nighttime CPAP Hypertension- continue metoprol tartrate 25mg  BID, Hypothyroidism-Continuej levothyroxine 137. MDD: Continue sertraline 50mg  daily  Malnutrition Assessment using AND/ASPEN or GLIM Clinical Characteristics: Non-severe (Moderate) Protein-Calorie Malnutrition in the context of chronic illness (09/29/22 1147) Interpretation of Wt. Loss: > or equal to 10% x 6 month (20% x 5 months) Fat Loss: Moderate Muscle Loss: Moderate Malnutrition Score: 3  Checklist: Diet: Regular Diet DVT PPx: SCDs Code Status: Full Code Dispo: Patient appropriate for Inpatient based on  expectation of ongoing need for hospitalization greater than two midnights based on severity of presentation/services including bloody diarrhea requiring IVF and daily labs.  Team Contact Information: Primary Team: Internal Medicine (MEDU) Primary Resident:  Jacalyn Lefevre, MD Resident's Pager: [phone] (Gen [facility] Intern - Alvester Morin)  Interval history: Underwent flex sig demonstrating no signs of colitis however bleeding external hemorrhoids. Continued to have episodes of bloody BM today but diarrhea has otherwise improved.  Objective: Physical Exam: Temp: [36.2 C (97.2 F)-36.9 C (98.4 F)] 36.7 C (98.1 F) Heart Rate: [66-86] 86 SpO2 Pulse: [66-74] 66 Resp: [14-20] 18 BP: (88-125)/(51-74) 99/72 FiO2 (%): [21 %] 21 % SpO2: [99 %-100 %] 100 %  Gen: NAD, converses clearly. Heart: RRR Lungs: Comfortably breathing on RA Abdomen: Non-distended Psych: Alert, oriented  Salli Real MD Internal Medicine/ Pediatrics PGY-2 Pager: [phone]    Associated attestation - Ruffin Pyo, MD - 10/02/2022 10:45 PM EDT Formatting of this note is different from the original. 62 yo M PMH cryptogenic cirrhosis (s/p liver tx 08/2018), OSA, AS s/p AVR, CAD s/p CABG who p/w bloody diarrhea to [facility] 7/29, transferred to [facility] for hepatology. S/p flex sig 8/9 w/ hemorrhoids and normal colon, likely mycophenolate induced colitis. Plan: Hgb dropped today, transfuse 1u pRBC and recheck Hgb. Likely d/t restarting home asa and bleeding hemorrhoids. Discussed w/ pt's inpatient and outpatient hepatologist and will continue holding mycophenolate on dc. If BG still elevated can start Tradjenta outpatient. DC home tomorrow if Hgb stable.  I saw and evaluated the patient on 10/02/22, participating in the key portions of the service. I reviewed the resident's note. I agree with the resident's findings and plan.  Ruffin Pyo, MD Hospital Medicine              =================================== LFT  wnl yesterday.   Glucose elevation noted at home.  On jardiance 2x10mg  tabs.  Will change to 25mg  soon.  On 40mg  prednisone.  Sugar has been up to 400 after eating.    Inpatient course d/w pt.  Diarrhea better.  Concern for med induced colitis.  Hemorrhoids have improved in the meantime.  Med list updated.  No blood in stool since discharge from hospital.  No fevers.  No jaundice.  No abd pain.  No diarrhea.  BM this AM wnl.    Has been off fosamax.  Due for DXA, ordered.  Recently normal vit D level.    He is diffusely weak with lower HGB noted.     He needs help with tradjenta with med assistance. He is going to check with [facility] about getting help on that med.

## 2022-10-09 NOTE — Patient Instructions (Addendum)
Please call about the bone density test.   [facility] [address] [place] [address] [place]   Take care.  Glad to see you. Drink plenty of water, limit carbs, and call [facility] about tradjenta help.  Update me as needed.

## 2022-10-10 LAB — CBC W/ DIFFERENTIAL
BASOPHILS ABSOLUTE COUNT: 0.1 10*3/uL (ref 0.0–0.2)
BASOPHILS RELATIVE PERCENT: 1 %
EOSINOPHILS ABSOLUTE COUNT: 0 10*3/uL (ref 0.0–0.4)
EOSINOPHILS RELATIVE PERCENT: 0 %
HEMATOCRIT: 25 % — ABNORMAL LOW (ref 37.5–51.0)
HEMOGLOBIN: 7.7 g/dL — ABNORMAL LOW (ref 13.0–17.7)
LYMPHOCYTES ABSOLUTE COUNT: 3.9 10*3/uL — ABNORMAL HIGH (ref 0.7–3.1)
LYMPHOCYTES RELATIVE PERCENT: 36 %
MEAN CORPUSCULAR HEMOGLOBIN CONC: 30.8 g/dL — ABNORMAL LOW (ref 31.5–35.7)
MEAN CORPUSCULAR HEMOGLOBIN: 30 pg (ref 26.6–33.0)
MEAN CORPUSCULAR VOLUME: 97 fL (ref 79–97)
MONOCYTES ABSOLUTE COUNT: 0.4 10*3/uL (ref 0.1–0.9)
MONOCYTES RELATIVE PERCENT: 4 %
NEUTROPHILS ABSOLUTE COUNT: 6 10*3/uL (ref 1.4–7.0)
NEUTROPHILS RELATIVE PERCENT: 52 %
NUCLEATED RED BLOOD CELLS: 4 % — ABNORMAL HIGH (ref 0–0)
PLATELET COUNT: 345 10*3/uL (ref 150–450)
RED BLOOD CELL COUNT: 2.57 x10E6/uL — CL (ref 4.14–5.80)
RED CELL DISTRIBUTION WIDTH: 18.3 % — ABNORMAL HIGH (ref 11.6–15.4)
WHITE BLOOD CELL COUNT: 10.8 10*3/uL (ref 3.4–10.8)

## 2022-10-10 LAB — IMMATURE CELLS
BANDS: 4 %
METAMYLOCYTES-LABCORP: 1 % — ABNORMAL HIGH (ref 0–0)
MYELOCYTES: 2 % — ABNORMAL HIGH (ref 0–0)

## 2022-10-11 LAB — TACROLIMUS LEVEL: TACROLIMUS BLOOD: 2.7 ng/mL (ref 2.0–20.0)

## 2022-10-11 NOTE — Assessment & Plan Note (Addendum)
See above, no jaundice.  No abdominal pain.  LFTs normal.  Per transplant team.

## 2022-10-11 NOTE — Assessment & Plan Note (Signed)
He is going to increase Jardiance to 25 mg and will check with you and see about getting help with med assistance for Tradjenta.  I expect that prednisone contributes to his relative hypoglycemia.  Cautions discussed with patient.  At this point still okay for outpatient follow-up.

## 2022-10-11 NOTE — Assessment & Plan Note (Addendum)
History of, hemoglobin is recently stable.  He has follow-up with [facility] pending.  No gross blood in the stool.  Benign abdominal exam.  At this point still okay for outpatient follow-up.  At this point no other intervention for hemorrhoids.  He does not have signs concerning for active colitis currently.

## 2022-10-11 NOTE — Assessment & Plan Note (Signed)
Has been off fosamax.  Due for DXA, ordered.  Recently normal vit D level.

## 2022-10-12 DIAGNOSIS — Z5181 Encounter for therapeutic drug level monitoring: Principal | ICD-10-CM

## 2022-10-12 DIAGNOSIS — E612 Magnesium deficiency: Principal | ICD-10-CM

## 2022-10-12 DIAGNOSIS — Z944 Liver transplant status: Principal | ICD-10-CM

## 2022-10-13 MED ORDER — PREDNISONE 10 MG TABLET
ORAL_TABLET | ORAL | 0 refills | 0 days
Start: 2022-10-13 — End: ?

## 2022-10-14 ENCOUNTER — Ambulatory Visit: Admit: 2022-10-14 | Discharge: 2022-10-15 | Payer: MEDICARE

## 2022-10-14 DIAGNOSIS — K644 Residual hemorrhoidal skin tags: Principal | ICD-10-CM

## 2022-10-14 NOTE — Unmapped (Signed)
We will start with medical treatment  [facility] hemorrhoid compound apply daily  Supplement diet with Water (64 oz) & Fiber (30g) daily  Avoid constipation & Straining  Follow up in 8 weeks, if patient fails to improve then we will proceed with surgery

## 2022-10-14 NOTE — Unmapped (Signed)
Anthony Alvarado  62 y.o. 09-Jul-1960  Phone: [phone] (home)   Address: [address]  [facility] [place] [address]   MRN: [id number]  Primary MD : Ruffin Pyo   [facility]     Problem List Items Addressed This Visit          Digestive    Hemorrhoids, external - Primary     We will start with medical treatment  [name] hemorrhoid compound apply daily  Supplement diet with Water (64 oz) & Fiber (30g) daily  Avoid constipation & Straining  Follow up in 8 weeks, if patient fails to improve then we will proceed with surgery            Other Visit Diagnoses       Bleeding external hemorrhoids                Hemorrhoid Consultation Note    Past Medical History:   Diagnosis Date    Aortic valve stenosis     Autoimmune cholangitis     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    Cholestatic cirrhosis (CMS-HCC)     Hepatic encephalopathy (CMS-HCC)     Hypertension     Other osteoporosis without current pathological fracture     Sleep apnea     Type 2 diabetes mellitus (CMS-HCC)      Patient Active Problem List   Diagnosis    Diabetes mellitus without complication (CMS-HCC)    Hypothyroidism    Cirrhosis of liver without ascites (CMS-HCC)    OSA (obstructive sleep apnea)    Essential hypertension    Encounter for pre-transplant evaluation for chronic liver disease    Pleural effusion    Pneumothorax after biopsy    Liver transplant recipient (CMS-HCC)    Allergic rhinitis    Arthralgia    Asthma    Bicuspid aortic valve    Flying phobia    Mucosal abnormality of stomach    Nephrolithiasis    Pure hypercholesterolemia    Umbilical hernia    Unspecified hearing loss    COVID-19    Colitis    Hemorrhoids, external     Past Surgical History:   Procedure Laterality Date    APPENDECTOMY      CHG US GUIDE, TISSUE ABLATION N/A 05/03/2018    Procedure: ULTRASOUND GUIDANCE FOR, AND MONITORING OF, PARENCHYMAL TISSUE ABLATION;  Surgeon: Particia Nearing, MD;  Location: MAIN OR [facility];  Service: Transplant    LIVER TRANSPLANTATION  09/16/2018    PR CATH PLACE/CORON ANGIO, IMG SUPER/INTERP,R&L HRT CATH, L HRT VENTRIC N/A 03/21/2018    Procedure: Left/Right Heart Catheterization;  Surgeon: Neal Dy, MD;  Location: [facility] CATH;  Service: Cardiology    PR COLONOSCOPY W/BIOPSY SINGLE/MULTIPLE N/A 09/09/2022    Procedure: COLONOSCOPY, FLEXIBLE, PROXIMAL TO SPLENIC FLEXURE; WITH BIOPSY, SINGLE OR MULTIPLE;  Surgeon: Jules Husbands, MD;  Location: GI PROCEDURES MEMORIAL [facility];  Service: Gastroenterology    PR DECORTICATION,PULMONARY,TOTAL Right 01/13/2019    Procedure: Decortic Pulm (Separt Proc); Tot;  Surgeon: Evert Kohl, MD;  Location: MAIN OR [facility];  Service: Thoracic    PR EGD FLEXIBLE FOREIGN BODY REMOVAL N/A 12/26/2018    Procedure: UGI ENDOSCOPY; W/REMOVAL FOREIGN BODY;  Surgeon: Vonda Antigua, MD;  Location: GI PROCEDURES MEMORIAL [facility];  Service: Gastroenterology    PR LAP,ABLAT 1+ LIVER TUMOR(S),RADIOFREQ N/A 05/03/2018    Procedure: LAPAROSCOPY, SURGICAL, ABLATION OF 1 OR MORE  LIVER TUMOR(S); RADIOFREQUENCY;  Surgeon: Particia Nearing, MD;  Location: MA[facility];  Service: Transplant    PR PERQ DRAINAGE PLEURA INSERT CATH W/IMAGING N/A 01/11/2019    Procedure: PLEURAL DRAINAGE, PERC, W INSERTION OF INDWELLING CATHETER; W IMAGING GUIDANCE;  Surgeon: Jerelyn Charles, MD;  Location: BRONCH PROCEDURE LAB [facility];  Service: Pulmonary    PR SIGMOIDOSCOPY FLX DX W/COLLJ SPEC BR/WA IF PFRMD N/A 10/02/2022    Procedure: SIGMOIDOSCOPY, FLEXIBLE; DIAGNOSTIC, WITH OR WITHOUT COLLECTION OF SPECIMEN(S) BY BRUSHING OR WASHING;  Surgeon: Cira Servant, DO;  Location: GI PROCEDURES [facility];  Service: Gastroenterology    PR THORACENTESIS NEEDLE/CATH PLEURA W/IMAGING N/A 01/06/2019    Procedure: THORACENTESIS W/ IMAGING;  Surgeon: Cleora Fleet, MD;  Location: BRONCH PROCEDURE LAB [facility];  Service: Pulmonary    PR THORACOSCOPY SURG W/PLEURODESIS Right 01/13/2019    Procedure: THORACOSCOPY, SURGICAL; WITH PLEURODESIS (EG, MECHANICAL OR CHEMICAL);  Surgeon: Evert Kohl, MD;  Location: MAIN OR [facility];  Service: Thoracic    PR TRANSPLANT LIVER,ALLOTRANSPLANT Bilateral 09/16/2018    Procedure: LIVER ALLOTRANSPLANTATION; ORTHOTOPIC, PARTIAL OR WHOLE, FROM CADAVER OR LIVING DONOR, ANY AGE;  Surgeon: Florene Glen, MD;  Location: MAIN OR [place];  Service: Transplant    PR TRANSPLANT,PREP DONOR LIVER, WHOLE N/A 09/16/2018    Procedure: BACKBNCH STD PREP CAD DONOR WHOLE LIVER GFT PRIOR TNSPLNT,INC CHOLE,DISS/REM SURR TISSU WO TRISEG/LOBE SPLT;  Surgeon: Florene Glen, MD;  Location: MAIN OR [place];  Service: Transplant    SKIN BIOPSY         Allergies   Allergen Reactions    Watermelon Flavor      Mouth itching         Current Outpatient Medications:     aspirin 325 MG tablet, Take 1 tablet (325 mg total) by mouth daily., Disp: , Rfl:     azelastine (ASTELIN) 137 mcg (0.1 %) nasal spray, 1 spray into each nostril daily as needed., Disp: , Rfl:     blood sugar diagnostic (ONETOUCH ULTRA TEST) Strp, USE TO CHECK SUGAR DAILY, Disp: , Rfl:     calcium carbonate (TUMS) 200 mg calcium (500 mg) chewable tablet, Chew 1 tablet (500 mg total) daily. Pt reports taking every other day., Disp: , Rfl:     dapsone 100 MG tablet, Take 1 tablet (100 mg total) by mouth daily., Disp: 30 tablet, Rfl: 2    empagliflozin (JARDIANCE) 25 mg tablet, Take 1 tablet (25 mg total) by mouth daily., Disp: 30 tablet, Rfl: 0    fluticasone propionate (FLONASE) 50 mcg/actuation nasal spray, PLACE ONE OR TWO SPRAYS INTO BOTH NOSTRILS DAILY AS NEEDED., Disp: , Rfl:     hydrocortisone 1 % cream, Apply topically to hemorrhoids up to twice a day, Disp: 30 g, Rfl: 0    levothyroxine (SYNTHROID) 175 MCG tablet, Take 1 tablet (175 mcg total) by mouth daily before breakfast., Disp: 90 tablet, Rfl: 1    metoPROLOL tartrate (LOPRESSOR) 25 MG tablet, Take 1 tablet (25 mg total) by mouth two (2) times a day., Disp: 180 tablet, Rfl: 1    pravastatin (PRAVACHOL) 40 MG tablet, Take 1 tablet (40 mg total) by mouth every evening., Disp: 90 tablet, Rfl: 0    predniSONE (DELTASONE) 20 MG tablet, Take 2 tablets (40 mg total) by mouth in the morning., Disp: 60 tablet, Rfl: 0    sertraline (ZOLOFT) 50 MG tablet, Take 1 tablet (50 mg total) by mouth daily., Disp: 90 tablet, Rfl: 3  tacrolimus (PROGRAF) 1 MG capsule, Take 3 capsules (3 mg total) by mouth two (2) times a day., Disp: 180 capsule, Rfl: 11  No current facility-administered medications for this visit.    Facility-Administered Medications Ordered in Other Visits:     diazePAM (VALIUM) 5 mg/mL injection, , , ,      reports that he has never smoked. He has never used smokeless tobacco. He reports that he does not currently use alcohol. He reports that he does not use drugs.    He indicated that the status of his mother is unknown. He indicated that the status of his father is unknown. He indicated that the status of his maternal grandfather is unknown. He indicated that the status of his neg hx is unknown.           HPI:    Patient ID: Anthony Alvarado, 62 y.o., male  Duration: several years (patient has prior history of cirrhosis status post liver transplant)  Location:external, bleeding  Severity:severe  Associated symptoms: external bleeding hemorrhoids  Alleviating factors: Resolution of colitis and diarrhea  Aggravating factors: Diarrhea and the patient's had a recent bout of colitis beginning in June 2024 with multiple episodes of diarrhea which is aggravated the hemorrhoids resulting in hematochezia patient was hospitalized approximately 2 weeks ago .  Daily aspirin use 325 mg  Last colonoscopy:10/02/2022 flexible sigmoidoscopy -hemorrhoids found on perianal exam, solid stool found in sigmoid colon precluding visualization, rectum rectosigmoid colon and sigmoid colon otherwise appeared normal  Previous Treatment: Preparation H, stool softeners, fiber supplements, bleeding has subsided after resolution of colitis and diarrhea    Review of Systems   Constitutional:  Negative for chills, fever, malaise/fatigue and weight loss.   HENT:  Negative for congestion and sore throat.    Eyes:  Negative for double vision, photophobia and redness.   Respiratory:  Negative for cough, hemoptysis, shortness of breath, wheezing and stridor.    Cardiovascular:  Negative for chest pain, palpitations, orthopnea, claudication and leg swelling.   Gastrointestinal:  Positive for blood in stool and diarrhea. Negative for abdominal pain, constipation, melena, nausea and vomiting.   Genitourinary:  Negative for frequency, hematuria and urgency.   Musculoskeletal:  Negative for joint pain.   Skin:  Negative for itching and rash.   Neurological:  Negative for dizziness, tremors, focal weakness, seizures, loss of consciousness, weakness and headaches.   Endo/Heme/Allergies:  Negative for polydipsia. Does not bruise/bleed easily.   Psychiatric/Behavioral:  Negative for memory loss and suicidal ideas. The patient does not have insomnia.      PHYSICAL EXAM:   BP 143/84  - Pulse 68  - Temp 36.5 ??C (97.7 ??F)  - Ht 172.7 cm (5' 8)  - Wt 75.5 kg (166 lb 8 oz)  - BMI 25.32 kg/m??     Physical Exam  Constitutional:       General: He is not in acute distress.     Appearance: Normal appearance. He is well-developed and normal weight. He is not ill-appearing, toxic-appearing or diaphoretic.   HENT:      Head: Normocephalic and atraumatic.      Right Ear: External ear normal.      Left Ear: External ear normal.      Nose: Nose normal.      Mouth/Throat:      Pharynx: No oropharyngeal exudate.   Eyes:      General: No scleral icterus.        Right eye: No discharge.  Left eye: No discharge.      Conjunctiva/sclera: Conjunctivae normal.      Pupils: Pupils are equal, round, and reactive to light.   Neck:      Thyroid: No thyromegaly.      Vascular: No JVD.      Trachea: No tracheal deviation.   Cardiovascular:      Rate and Rhythm: Normal rate and regular rhythm.      Heart sounds: Normal heart sounds. No murmur heard.     No friction rub. No gallop.   Pulmonary:      Effort: Pulmonary effort is normal. No respiratory distress.      Breath sounds: Normal breath sounds. No stridor. No wheezing, rhonchi or rales.   Chest:      Chest wall: No tenderness.   Abdominal:      General: Bowel sounds are normal. There is no distension.      Palpations: Abdomen is soft. There is no mass.      Tenderness: There is no abdominal tenderness. There is no right CVA tenderness, left CVA tenderness, guarding or rebound.      Hernia: No hernia is present.   Genitourinary:         Comments: 2 external hemorrhoids  Non thrombosed  Non inflamed  Non tender  No bleeding  Musculoskeletal:         General: No tenderness or deformity. Normal range of motion.      Cervical back: Normal range of motion and neck supple. No rigidity or tenderness.   Lymphadenopathy:      Cervical: No cervical adenopathy.   Skin:     General: Skin is warm and dry.      Coloration: Skin is not pale.      Findings: No erythema or rash.   Neurological:      General: No focal deficit present.      Mental Status: He is alert and oriented to person, place, and time.      Cranial Nerves: No cranial nerve deficit.      Motor: Weakness present.      Coordination: Coordination normal.      Gait: Gait normal.      Deep Tendon Reflexes: Reflexes are normal and symmetric.   Psychiatric:         Mood and Affect: Mood normal.         Behavior: Behavior normal.         Thought Content: Thought content normal.         Judgment: Judgment normal.        Data Review:    Lab Results   Component Value Date    WBC 10.8 10/08/2022    HGB 7.7 (L) 10/08/2022    HCT 25.0 (L) 10/08/2022    PLT 345 10/08/2022       Lab Results   Component Value Date    NA 138 10/08/2022    K 4.6 10/08/2022    CL 104 10/08/2022    CO2 20 10/08/2022    BUN 25 10/08/2022    CREATININE 0.91 10/08/2022    GLU 147 10/03/2022    CALCIUM 8.9 10/08/2022    MG 1.7 10/08/2022    PHOS 3.1 10/01/2022       Lab Results   Component Value Date    BILITOT 0.2 10/08/2022    BILIDIR 0.11 10/08/2022    PROT 5.5 (L) 10/08/2022    ALBUMIN 2.4 (L) 10/01/2022    ALT 26  10/08/2022    AST 15 10/08/2022    ALKPHOS 79 10/08/2022    GGT 41 10/08/2022       Lab Results   Component Value Date    PT 12.1 09/27/2022    INR 1.08 09/27/2022    APTT 29.6 10/12/2018     Imaging:    IMPRESSION: CTA Abdomen Pelvis W Wo Contrast : 09/21/2022  1. Evidence of ongoing widespread Colitis since the CT last month,   most pronounced in the sigmoid colon.   New small volume of simple density free fluid in the pelvis, likely   reactive. No evidence of bowel perforation or obstruction.   Negative for active GI bleeding by CTA.   2. No other acute or inflammatory process identified.     IMPRESSION: CT Abdomen Pelvis W Contrast 08/16/2022  1. Moderate severity descending and sigmoid colitis.   2. Evidence of prior cholecystectomy and prior appendectomy.   3. Mild to moderate severity urinary bladder wall thickening, which   may be secondary to chronic bladder outlet obstruction. Further   evaluation with urinalysis is recommended, as sequelae associated   with acute cystitis cannot be excluded.

## 2022-10-15 NOTE — Unmapped (Signed)
Received message from NP Harms, recommending patient be instructed to repeat CBC only this week, and if he is feeling well, with no further diarrhea, then can decrease prednisone to 30 mg daily. Left VM message for patient to return call to [facility] to discuss labs and prednisone dosing.

## 2022-10-16 DIAGNOSIS — D649 Anemia, unspecified: Secondary | ICD-10-CM | POA: Diagnosis not present

## 2022-10-16 LAB — CBC
HEMATOCRIT: 32.2 % — ABNORMAL LOW (ref 37.5–51.0)
HEMOGLOBIN: 10.2 g/dL — ABNORMAL LOW (ref 13.0–17.7)
MEAN CORPUSCULAR HEMOGLOBIN CONC: 31.7 g/dL (ref 31.5–35.7)
MEAN CORPUSCULAR HEMOGLOBIN: 31.2 pg (ref 26.6–33.0)
MEAN CORPUSCULAR VOLUME: 99 fL — ABNORMAL HIGH (ref 79–97)
PLATELET COUNT: 176 10*3/uL (ref 150–450)
RED BLOOD CELL COUNT: 3.27 x10E6/uL — ABNORMAL LOW (ref 4.14–5.80)
RED CELL DISTRIBUTION WIDTH: 18.1 % — ABNORMAL HIGH (ref 11.6–15.4)
WHITE BLOOD CELL COUNT: 8.9 10*3/uL (ref 3.4–10.8)

## 2022-10-16 NOTE — Unmapped (Signed)
Opened in error

## 2022-10-17 NOTE — Unmapped (Signed)
Patient completed CBC today with improvement in hgb/hematocrit. Discussed diarrhea issue with NP Harms, who recommended no changes in his prednisone dose until his diarrhea resolves and wanted him to repeat full txp labs next week. Spoke to patient today. He reported he had a formed stool in the morning but two watery stools thereafter, none with frank blood. Encouraged him to apply ointment that he was prescribed by the GI provider. He verbalized understanding and agreed to repeat labs next week.

## 2022-10-19 ENCOUNTER — Ambulatory Visit: Admit: 2022-10-19 | Discharge: 2022-10-20 | Payer: MEDICARE

## 2022-10-19 DIAGNOSIS — Z944 Liver transplant status: Principal | ICD-10-CM

## 2022-10-19 DIAGNOSIS — E119 Type 2 diabetes mellitus without complications: Principal | ICD-10-CM

## 2022-10-19 DIAGNOSIS — E612 Magnesium deficiency: Principal | ICD-10-CM

## 2022-10-19 DIAGNOSIS — K760 Fatty (change of) liver, not elsewhere classified: Principal | ICD-10-CM

## 2022-10-19 DIAGNOSIS — Z5181 Encounter for therapeutic drug level monitoring: Principal | ICD-10-CM

## 2022-10-19 MED ORDER — PIOGLITAZONE 15 MG TABLET
ORAL_TABLET | Freq: Every day | ORAL | 11 refills | 30 days | Status: CP
Start: 2022-10-19 — End: 2023-10-19
  Filled 2022-10-22: qty 60, 30d supply, fill #0

## 2022-10-19 NOTE — Unmapped (Signed)
Assessment and Plan:   1. Diabetes, post-transplant: .  Last A1c = 7.4% on 09/15/2022 with goal <7% without hypoglycemia.   Has recently been higher due to prednisone for colitis  Needs another agent and we discussed DDP4 inh vs another agent such as TZD.  With liver transplant now with steatosis, it is possible that a TZD may provide some additional benefits for steatosis. He has no contraindications (no HF, no bladder cancer). If ends up not using then DDP4 could be good choice as well and would favor Trajenta (could be done under [facility])    -- Continue taking the Jardiance 25 mg daily  -- Start the Pioglitazone 15 mg once a day. If after a month, feeling fine, then increase to 30 mg daily.  -- continue to hold the Mteformin while still having some GI effects from colitis  -- is going to be on Pred 40 for at least another month. We discussed whether he may need some temporary insulin. If by the week of Sept 7th-ish your daytime and afternoon blood sugars are always above 300 due to the prednisone then he will let me know and we may start a small dose of insulin (probably 5-10 units NPH in the morning) to help with post prandial highs during the day.           Subjective:   Reason for visit:   Anthony Alvarado is a 62 y.o. year old male with a history of diabetes post-transplant who presents today for a diabetes-related visit. PMH includes liver transplant (2022), hepatic steatosis, obesity, hypothyroidism, and HTN, CAD s/p CABG (2023)    08/2022: appt with Dr. Marcella Dubs, appreciate help    At last visit  HOLD Ozempic due to severe diarrhea/GI upset  Hold metformin XR 1000 mg PO BID.  Continue Jardiance 25 mg PO daily.    08/10/22: Patient sent MyChart message stating Hi, i'am not tolerating the ozempic good. I have diarrea bad and it feels like I need to go to bathroom all the time. I stopped taking it sunday 6/08 and did not take any yesterday. I have had no change since stopping. I have took at least 6 weeks worth. It's also time for my follow up. Patient was scheduled with [facility] to follow up re: GI ADE    08/16/22: Seen in ED, diagnosed with colitis and prescribed azithromycin. Also had workup for C. Difficile, results pending.    09/02/22: Patient sent message to [facility] via MyChart complaining of hemorrhoids and diarrhea after stopping Ozempic. Was then instructed to stop metformin and avoid immodium.      Social History:   Tobacco use: no  Illicit drug use use: no  Alcohol use: no  Occupation: Unemployed  Household: Lives alone    POC glucose level today of N/A mg/dL - virtual visit.     Glucose Monitoring: Glucose Meter - Did not have BG data today.     Hypoglycemia:    Symptoms of hypoglycemia since last visit: no   If no recent hypoglycemia, prior recognition of hypoglycemia symptoms and knowledge of treatment: yes   Treats with:  n/a    Current Medications:   Ozempic 0.25 mg once weekly - stopped on 08/01/2022  Metformin XR 1000 mg BID  - stopped on 09/02/2022  Jardiance 25 mg daily    CARDIOVASCULAR RISK REDUCTION  History of clinical ASCVD? yes, CAD s/p CABG 2023  History of heart failure? no  History of hyperlipidemia? yes  Taking statin?  yes, pravastatin 40 mg daily  Taking aspirin? yes  Taking SGLT-2i and/or GLP- 1 RA? yes, Jardiance 10 mg daily; stopped Ozempic on 08/01/2022    BLOOD PRESSURE CONTROL  History of hypertension?: yes  Taking ACEi/ARB? no    KIDNEY CARE  History of Chronic Kidney Disease? no  History of albuminuria? no  Taking SGLT-2i and/or GLP- 1 RA? As above  Taking ACEi/ARB? As above      Current Outpatient Medications:     aspirin 325 MG tablet, Take 1 tablet (325 mg total) by mouth daily., Disp: , Rfl:     azelastine (ASTELIN) 137 mcg (0.1 %) nasal spray, 1 spray into each nostril daily as needed., Disp: , Rfl:     blood sugar diagnostic (ONETOUCH ULTRA TEST) Strp, USE TO CHECK SUGAR DAILY, Disp: , Rfl:     calcium carbonate (TUMS) 200 mg calcium (500 mg) chewable tablet, Chew 1 tablet (200 mg of elem calcium total) daily. Pt reports taking every other day., Disp: , Rfl:     FEROCON 110-0.5 mg capsule, TAKE 1 CAPSULE BY MOUTH 2 (TWO) TIMES DAILY AFTER A MEAL. NOT COVERED BY INSURANCE. (Patient not taking: Reported on 09/15/2022), Disp: , Rfl:     fluticasone propionate (FLONASE) 50 mcg/actuation nasal spray, PLACE ONE OR TWO SPRAYS INTO BOTH NOSTRILS DAILY AS NEEDED., Disp: , Rfl:     JARDIANCE 10 mg tablet, Pt reports that he has not taken this medication in about 1 month due to the price., Disp: , Rfl:     levothyroxine (SYNTHROID) 175 MCG tablet, Take 1 tablet (175 mcg total) by mouth daily before breakfast., Disp: 90 tablet, Rfl: 1    metoPROLOL tartrate (LOPRESSOR) 25 MG tablet, Take 1 tablet (25 mg total) by mouth two (2) times a day., Disp: 180 tablet, Rfl: 1    mycophenolate (CELLCEPT) 250 mg capsule, Take 1 capsule (250 mg total) by mouth Two (2) times a day., Disp: 180 capsule, Rfl: 3    potassium chloride 20 MEQ ER tablet, Take 1 tablet (20 mEq total) by mouth daily., Disp: 30 tablet, Rfl: 1    pravastatin (PRAVACHOL) 40 MG tablet, Take 1 tablet (40 mg total) by mouth every evening., Disp: 90 tablet, Rfl: 0    sertraline (ZOLOFT) 50 MG tablet, Take 1 tablet (50 mg total) by mouth daily., Disp: 90 tablet, Rfl: 3    tacrolimus (PROGRAF) 1 MG capsule, Take 2 capsules (2 mg total) by mouth two (2) times a day., Disp: 120 capsule, Rfl: 11  No current facility-administered medications for this visit.    Facility-Administered Medications Ordered in Other Visits:     diazePAM (VALIUM) 5 mg/mL injection, , , ,     Objective:   Vitals:    There were no vitals filed for this visit.      Past Medical History:    Active Ambulatory Problems     Diagnosis Date Noted    Diabetes mellitus without complication (CMS-HCC) 05/09/2017    Hypothyroidism 08/09/2006    Cirrhosis of liver without ascites (CMS-HCC) 01/10/2018    OSA (obstructive sleep apnea) 09/14/2011    Essential hypertension 08/09/2006    Encounter for pre-transplant evaluation for chronic liver disease 03/10/2018    Pleural effusion 09/09/2018    Pneumothorax after biopsy 09/11/2018    Liver transplant recipient (CMS-HCC) 09/16/2018    Allergic rhinitis 08/09/2006    Arthralgia 10/25/2017    Asthma 09/14/2011    Bicuspid aortic valve 02/25/2011    Flying phobia  09/18/2013    Mucosal abnormality of stomach 09/27/2018    Nephrolithiasis 07/28/2018    Pure hypercholesterolemia 02/14/2007    Umbilical hernia 08/14/2012    Unspecified hearing loss 08/11/2006    COVID-19 03/16/2019     Resolved Ambulatory Problems     Diagnosis Date Noted    No Resolved Ambulatory Problems     Past Medical History:   Diagnosis Date    Aortic valve stenosis     Autoimmune cholangitis     Carrier of hemochromatosis HFE gene mutation     Cholestatic cirrhosis (CMS-HCC)     Hepatic encephalopathy (CMS-HCC)     Hypertension     Other osteoporosis without current pathological fracture     Sleep apnea     Type 2 diabetes mellitus (CMS-HCC)        Wt Readings from Last 3 Encounters:   09/15/22 69.9 kg (154 lb)   05/12/22 93.2 kg (205 lb 6.4 oz)   04/06/22 93.9 kg (207 lb)       Lab Results   Component Value Date    A1C 7.4 (H) 09/15/2022    A1C 7.4 (H) 04/06/2022    A1C 6.6 (H) 09/18/2019    A1C 6.0 (H) 04/17/2019    A1C 5.9 (H) 12/19/2018    A1C 4.7 (L) 10/24/2018    A1C 4.4 (L) 09/15/2018    A1C 5.1 01/04/2018       Lab Results   Component Value Date    NA 135 09/15/2022    K 2.9 (L) 09/15/2022    CL 98 09/15/2022    CO2 23.0 09/15/2022    BUN 25 (H) 09/15/2022    CREATININE 1.42 (H) 09/15/2022    GLU 132 (H) 09/15/2022    CALCIUM 9.0 09/15/2022    ALBUMIN 2.2 (L) 09/15/2022    PHOS 3.3 08/28/2021       Lab Results   Component Value Date    ALKPHOS 114 09/15/2022    BILITOT 0.5 09/15/2022    BILIDIR 0.18 09/04/2022    PROT 6.6 09/15/2022    ALBUMIN 2.2 (L) 09/15/2022    ALT 18 09/15/2022    AST 15 09/15/2022       No results found for: [facility]    Lab Results   Component Value Date CHOL 122 04/17/2019    CHOL 114 12/19/2018     Lab Results   Component Value Date    HDL 33 (L) 04/17/2019    HDL 33 (L) 12/19/2018     Lab Results   Component Value Date    LDL 52 (L) 04/17/2019    LDL 32 (L) 12/19/2018    LDL 79.6 03/21/2018     Lab Results   Component Value Date    VLDL 37 04/17/2019    VLDL 30.8 (H) 12/19/2018     Lab Results   Component Value Date    CHOLHDLRATIO 3.7 04/17/2019    CHOLHDLRATIO 3.5 12/19/2018     Lab Results   Component Value Date    TRIG 185 (H) 04/17/2019    TRIG 243 (H) 12/19/2018       The 10-year ASCVD risk score (Arnett DK, et al., 2019) is: 12.7%    Values used to calculate the score:      Age: 71 years      Sex: Male      Is Non-Hispanic African American: No      Diabetic: Yes      Tobacco smoker: No  Systolic Blood Pressure: 108 mmHg      Is BP treated: Yes      HDL Cholesterol: 28 mg/dL      Total Cholesterol: 113 mg/dL    Note: For patients with SBP <90 or >200, Total Cholesterol <130 or >320, HDL <20 or >100 which are outside of the allowable range, the calculator will use these upper or lower values to calculate the patient???s risk score.

## 2022-10-19 NOTE — Unmapped (Addendum)
1) Continue taking the Jardiance 25 mg daily    2) Start the Pioglitazone 15 mg once a day. If after a month, you are feeling fine, then you can increase to 30 mg daily.    3) Continue to hold the metformin for now    4) If by the week of Sept 7th-ish your daytime and afternoon blood sugars are always above 300 due to the prednisone then let me know and we may start a tiny dose of insulin to help while you are on the prednisone.

## 2022-10-19 NOTE — Unmapped (Signed)
ASSESSMENT and PLAN:   59 M s/p liver transplant 2022, recent MRI liver with some steatosis in transplant, obesity, T2DM  Last A1c = 7.4% on 09/15/2022  Has recently been higher due to prednisone for colitis  Needs another agent and we discussed DDP4 inh vs another agent such as TZD.  With liver transplant now with steatosis, it is possible that a TZD may provide some additional benefits for steatosis. He has no contraindications (no HF, no bladder cancer). If ends up not using then DDP4 could be good choice as well and would favor Trajenta (could be done under [facility] cares)    1. Diabetes mellitus with hyperglycemia (CMS-HCC)  2. Steatosis of liver  -- Continue taking the Jardiance 25 mg daily  -- Start the Pioglitazone 15 mg once a day. If after a month, feeling fine, then increase to 30 mg daily.  -- continue to hold the Mteformin while still having some GI effects from colitis  -- is going to be on Pred 40 for at least another month. We discussed whether he may need some temporary insulin. If by the week of Sept 7th-ish your daytime and afternoon blood sugars are always above 300 due to the prednisone then he will let me know and we may start a small dose of insulin (probably 5-10 units NPH in the morning) to help with post prandial highs during the day.    Return in about 6 months (around 04/21/2023) for In-person.  60 minutes spent in patient care    SUBJECTIVE:   Anthony Alvarado is a 62 y.o. year old male with a history of diabetes post-transplant who presents today for a diabetes-related visit. PMH includes liver transplant (2022), hepatic steatosis, obesity, hypothyroidism, and HTN, CAD s/p CABG (2023)    08/2022: appt with Dr. Marcella Dubs, appreciate help  At last visit  HOLD Ozempic due to severe diarrhea/GI upset  Hold metformin XR 1000 mg PO BID.  Continue Jardiance 25 mg PO daily.    08/10/22: Patient sent MyChart message stating Hi, i'am not tolerating the ozempic good. I have diarrea bad and it feels like I need to go to bathroom all the time. I stopped taking it sunday 6/08 and did not take any yesterday. I have had no change since stopping. I have took at least 6 weeks worth. It's also time for my follow up. Patient was scheduled with CPP to follow up re: GI ADE    08/16/22: Seen in ED, diagnosed with colitis and prescribed azithromycin. Also had workup for C. Difficile, results pending.    09/02/22: Patient sent message to [facility] via MyChart complaining of hemorrhoids and diarrhea after stopping Ozempic. Was then instructed to stop metformin and avoid immodium.    DM history:  2018: had pre-DM before liver transplant and controlled with diet, transiently on insulin but able to stop  2020: Liver transplant and since transplant has been on MTF and jardiance    Weight: since transplant has gained about 25 pounds; minimal exercise due to habit  Diet: cereal for breakfast, fast food at least once a day because too tired to cook    PCP Dr. Para March has been managing DM, last labs in 01/01/22 reviewed ([facility])  01/01/22 7.2%  No retinopathy: just checked by optho  No neuropathy: monofilament checked in Nov 23    [name] is currently using:   MTF 1000 BID: on hold for GI and colitis  Ozempic: stopped due to GI diarrhea  Jardiance 25 mg  daily    Pravastatin    Past Medical History:   Diagnosis Date    Aortic valve stenosis     Autoimmune cholangitis     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    Cholestatic cirrhosis (CMS-HCC)     Hepatic encephalopathy (CMS-HCC)     Hypertension     Other osteoporosis without current pathological fracture     Sleep apnea     Type 2 diabetes mellitus (CMS-HCC)        Current Outpatient Medications   Medication Sig Dispense Refill    aspirin 325 MG tablet Take 1 tablet (325 mg total) by mouth daily.      azelastine (ASTELIN) 137 mcg (0.1 %) nasal spray 1 spray into each nostril daily as needed.      blood sugar diagnostic (ONETOUCH ULTRA TEST) Strp USE TO CHECK SUGAR DAILY calcium carbonate (TUMS) 200 mg calcium (500 mg) chewable tablet Chew 1 tablet (500 mg total) daily. Pt reports taking every other day.      dapsone 100 MG tablet Take 1 tablet (100 mg total) by mouth daily. 30 tablet 2    empagliflozin (JARDIANCE) 25 mg tablet Take 1 tablet (25 mg total) by mouth daily. 30 tablet 0    fluticasone propionate (FLONASE) 50 mcg/actuation nasal spray PLACE ONE OR TWO SPRAYS INTO BOTH NOSTRILS DAILY AS NEEDED.      hydrocortisone 1 % cream Apply topically to hemorrhoids up to twice a day 30 g 0    levothyroxine (SYNTHROID) 175 MCG tablet Take 1 tablet (175 mcg total) by mouth daily before breakfast. 90 tablet 1    metoPROLOL tartrate (LOPRESSOR) 25 MG tablet Take 1 tablet (25 mg total) by mouth two (2) times a day. 180 tablet 1    pioglitazone (ACTOS) 15 MG tablet Take 2 tablets (30 mg total) by mouth daily. For the first month, take 15 mg once a day. If feeling fin then can increase to 30 mg once a day. 60 tablet 11    pravastatin (PRAVACHOL) 40 MG tablet Take 1 tablet (40 mg total) by mouth every evening. 90 tablet 0    predniSONE (DELTASONE) 20 MG tablet Take 2 tablets (40 mg total) by mouth in the morning. 60 tablet 0    sertraline (ZOLOFT) 50 MG tablet Take 1 tablet (50 mg total) by mouth daily. 90 tablet 3    tacrolimus (PROGRAF) 1 MG capsule Take 3 capsules (3 mg total) by mouth two (2) times a day. 180 capsule 11     No current facility-administered medications for this visit.     Facility-Administered Medications Ordered in Other Visits   Medication Dose Route Frequency Provider Last Rate Last Admin    diazePAM (VALIUM) 5 mg/mL injection                Allergies   Allergen Reactions    Watermelon Flavor      Mouth itching       Social History  Social History     Socioeconomic History    Marital status: Legally Separated   Tobacco Use    Smoking status: Never    Smokeless tobacco: Never   Vaping Use    Vaping status: Never Used   Substance and Sexual Activity    Alcohol use: Not Currently    Drug use: Never   Other Topics Concern    Do you use sunscreen? Yes    Tanning bed use? No  Are you easily burned? Yes    Excessive sun exposure? No    Blistering sunburns? Yes     Social Determinants of Health     Financial Resource Strain: Low Risk  (09/30/2022)    Overall Financial Resource Strain (CARDIA)     Difficulty of Paying Living Expenses: Not very hard   Food Insecurity: No Food Insecurity (09/22/2022)    Received from [facility]    Hunger Vital Sign     Worried About Running Out of Food in the Last Year: Never true     Ran Out of Food in the Last Year: Never true   Transportation Needs: No Transportation Needs (09/22/2022)    Received from [facility] - Transportation     Lack of Transportation (Medical): No     Lack of Transportation (Non-Medical): No   Physical Activity: Unknown (08/18/2022)    Received from [facility]    Exercise Vital Sign     Days of Exercise per Week: Patient declined     Minutes of Exercise per Session: 0 min   Stress: No Stress Concern Present (08/18/2022)    Received from [facility] - Occupational Stress Questionnaire     Feeling of Stress : Only a little   Social Connections: Moderately Integrated (08/18/2022)    Received from [name]    Social Connection and Isolation Panel [NHANES]     Frequency of Communication with Friends and Family: More than three times a week     Frequency of Social Gatherings with Friends and Family: Once a week     Attends Religious Services: More than 4 times per year     Active Member of [employer] or Organizations: Yes     Attends Engineer, structural: More than 4 times per year     Marital Status: Separated        Family History  No DM in family    Social Hx:  On disability  Smoke: none  EtOH: none       Review of Systems  A 10 systems reviewed and were negative except for pertinent items noted in the HPI and below:    Eyes: denies blurred vision; last dilated eye exam showed no diabetic retinopathy  CVS: denies CP   PULM:  denies SOB     OBJECTVE:     Vitals:    10/19/22 1318   BP: 149/84   Pulse: 78      Gen:  In no apparent distress, appears consistent with stated age   Eyes:  Anicteric, EOMI  Pulm: equal and symmetric respiratory effort  Abd: soft, non-tender, non-distended   MUSK: normal station and gait  Psych:  Alert and oriented x 4; mood is not down, depressed or anxious    Lab Review    DIABETES MELLITUS RESULTS:    Hemoglobin A1C (%)   Date Value   09/15/2022 7.4 (H)   04/06/2022 7.4 (H)   09/18/2019 6.6 (H)     Glucose, POC (mg/dL)   Date Value   16/11/9602 532 (HH)   10/03/2022 283 (H)   10/03/2022 171         Lab Results   Component Value Date    CREATININE 0.91 10/08/2022     Lab Results   Component Value Date    CHOL 122 04/17/2019     Lab Results   Component Value Date    HDL  33 (L) 04/17/2019     No components found for: LDLCALC, DIRECTLDL  Lab Results   Component Value Date    TRIG 185 (H) 04/17/2019     Lab Results   Component Value Date    TSH 1.440 06/19/2019     Creatinine (mg/dL)   Date Value   44/04/4740 0.91   10/03/2022 1.10   10/02/2022 1.13   10/01/2022 0.93   09/04/2022 1.21   07/14/2022 1.14

## 2022-10-20 DIAGNOSIS — Z796 Long-term use of immunosuppressant medication: Principal | ICD-10-CM

## 2022-10-20 DIAGNOSIS — Z944 Liver transplant status: Principal | ICD-10-CM

## 2022-10-20 DIAGNOSIS — E789 Disorder of lipoprotein metabolism, unspecified: Principal | ICD-10-CM

## 2022-10-20 MED ORDER — PRAVASTATIN 40 MG TABLET: ORAL_TABLET | Freq: Every evening | ORAL | 0 refills | 90 days

## 2022-10-20 MED ORDER — MYCOPHENOLATE MOFETIL 250 MG CAPSULE
ORAL_CAPSULE | Freq: Two times a day (BID) | ORAL | 3 refills | 90 days | Status: CP
Start: 2022-10-20 — End: 2022-10-20

## 2022-10-20 NOTE — Unmapped (Signed)
Pt request for RX Refill

## 2022-10-20 NOTE — Unmapped (Signed)
Please forward to patient's pcp Dr. Para March Fax: [phone]

## 2022-10-20 NOTE — Unmapped (Signed)
Encounter to remove mycophenolate from med list. On hold indefinitely due to severe mycophenolate colitis

## 2022-10-21 DIAGNOSIS — E612 Magnesium deficiency: Secondary | ICD-10-CM | POA: Diagnosis not present

## 2022-10-21 DIAGNOSIS — Z944 Liver transplant status: Secondary | ICD-10-CM | POA: Diagnosis not present

## 2022-10-21 DIAGNOSIS — Z5181 Encounter for therapeutic drug level monitoring: Secondary | ICD-10-CM | POA: Diagnosis not present

## 2022-10-21 NOTE — Unmapped (Signed)
Advised patient he should not take mycophenolate again. He said he thought that was the case.  Says he is feeling well.     After we hung up I realized I would like to know if he had any further diarrhea. If not then his prednisone can be tapered to 30 mg daily (assuming he is taking 40 mg). Once he is taking less than 20 mg of prednisone the Dapsone can be discontinued.   I asked him to call [facility] or send me a MyChart message to update  on frequency of stools

## 2022-10-22 LAB — COMPREHENSIVE METABOLIC PANEL
ALBUMIN: 4 g/dL (ref 3.8–4.9)
ALKALINE PHOSPHATASE: 102 IU/L (ref 44–121)
ALT (SGPT): 28 IU/L (ref 0–44)
AST (SGOT): 16 IU/L (ref 0–40)
BILIRUBIN TOTAL (MG/DL) IN SER/PLAS: 0.5 mg/dL (ref 0.0–1.2)
BLOOD UREA NITROGEN: 17 mg/dL (ref 8–27)
BUN / CREAT RATIO: 21 (ref 10–24)
CALCIUM: 8.9 mg/dL (ref 8.6–10.2)
CHLORIDE: 99 mmol/L (ref 96–106)
CO2: 24 mmol/L (ref 20–29)
CREATININE: 0.81 mg/dL (ref 0.76–1.27)
EGFR: 101 mL/min/{1.73_m2}
GLOBULIN, TOTAL: 2.2 g/dL (ref 1.5–4.5)
GLUCOSE: 230 mg/dL — ABNORMAL HIGH (ref 70–99)
POTASSIUM: 4.1 mmol/L (ref 3.5–5.2)
SODIUM: 135 mmol/L (ref 134–144)
TOTAL PROTEIN: 6.2 g/dL (ref 6.0–8.5)

## 2022-10-22 LAB — CBC W/ DIFFERENTIAL
BANDED NEUTROPHILS ABSOLUTE COUNT: 0.1 10*3/uL (ref 0.0–0.1)
BASOPHILS ABSOLUTE COUNT: 0.1 10*3/uL (ref 0.0–0.2)
BASOPHILS RELATIVE PERCENT: 1 %
EOSINOPHILS ABSOLUTE COUNT: 0.1 10*3/uL (ref 0.0–0.4)
EOSINOPHILS RELATIVE PERCENT: 1 %
HEMATOCRIT: 35.6 % — ABNORMAL LOW (ref 37.5–51.0)
HEMOGLOBIN: 11.4 g/dL — ABNORMAL LOW (ref 13.0–17.7)
IMMATURE GRANULOCYTES: 1 %
LYMPHOCYTES ABSOLUTE COUNT: 2.2 10*3/uL (ref 0.7–3.1)
LYMPHOCYTES RELATIVE PERCENT: 25 %
MEAN CORPUSCULAR HEMOGLOBIN CONC: 32 g/dL (ref 31.5–35.7)
MEAN CORPUSCULAR HEMOGLOBIN: 31.5 pg (ref 26.6–33.0)
MEAN CORPUSCULAR VOLUME: 98 fL — ABNORMAL HIGH (ref 79–97)
MONOCYTES ABSOLUTE COUNT: 0.7 10*3/uL (ref 0.1–0.9)
MONOCYTES RELATIVE PERCENT: 8 %
NEUTROPHILS ABSOLUTE COUNT: 5.5 10*3/uL (ref 1.4–7.0)
NEUTROPHILS RELATIVE PERCENT: 64 %
PLATELET COUNT: 194 10*3/uL (ref 150–450)
RED BLOOD CELL COUNT: 3.62 x10E6/uL — ABNORMAL LOW (ref 4.14–5.80)
RED CELL DISTRIBUTION WIDTH: 14.9 % (ref 11.6–15.4)
WHITE BLOOD CELL COUNT: 8.5 10*3/uL (ref 3.4–10.8)

## 2022-10-22 LAB — GAMMA GT: GAMMA GLUTAMYL TRANSFERASE: 49 IU/L (ref 0–65)

## 2022-10-22 LAB — PHOSPHORUS: PHOSPHORUS, SERUM: 2.6 mg/dL — ABNORMAL LOW (ref 2.8–4.1)

## 2022-10-22 LAB — BILIRUBIN, DIRECT: BILIRUBIN DIRECT: 0.15 mg/dL (ref 0.00–0.40)

## 2022-10-22 LAB — MAGNESIUM: MAGNESIUM: 1.6 mg/dL (ref 1.6–2.3)

## 2022-10-23 ENCOUNTER — Other Ambulatory Visit: Payer: Self-pay

## 2022-10-23 LAB — TACROLIMUS LEVEL: TACROLIMUS BLOOD: 2.4 ng/mL (ref 2.0–20.0)

## 2022-10-23 MED ORDER — PRAVASTATIN SODIUM 40 MG PO TABS: 40.0000 mg | ORAL_TABLET | Freq: Every evening | ORAL | 1 refills | Status: DC

## 2022-10-23 MED ORDER — PREDNISONE 20 MG TABLET
ORAL_TABLET | Freq: Every day | ORAL | 0 refills | 30 days | Status: CP
Start: 2022-10-23 — End: 2022-10-30

## 2022-10-23 MED ORDER — PRAVASTATIN 40 MG TABLET
ORAL_TABLET | Freq: Every evening | ORAL | 1 refills | 30 days
Start: 2022-10-23 — End: ?

## 2022-10-23 NOTE — Unmapped (Signed)
Per NP Harms, mycophenolate added to patient's allergy list d/t risk of diarrhea with present issues of colitis. Spoke with patient today, who said his diarrhea resolved after two days last week. Mentioned that his glucose was elevated. He reports it is fine in the morning, but it has been elevated at night and that he just got his pioglitazone and will be starting it today. Clarified instruction that he should be taking 15mg  daily for first month. Instructed him, per NP, to reduce his prednisone to 30mg  daily tomorrow, repeat labs next week and update [facility] if diarrhea recurs or he develops bloody stools. Encouraged him to f/up with endocrinology if glucose remains elevated or drops low. He verbalized understanding.

## 2022-10-23 NOTE — Progress Notes (Signed)
Refill request received from [facility] pharmacy for Pravastatin 40 mg tabs. Erx sent.

## 2022-10-26 DIAGNOSIS — Z944 Liver transplant status: Principal | ICD-10-CM

## 2022-10-26 DIAGNOSIS — Z5181 Encounter for therapeutic drug level monitoring: Principal | ICD-10-CM

## 2022-10-26 DIAGNOSIS — E612 Magnesium deficiency: Principal | ICD-10-CM

## 2022-10-26 MED FILL — PRAVASTATIN 40 MG TABLET: ORAL | 30 days supply | Qty: 30 | Fill #0

## 2022-10-30 ENCOUNTER — Ambulatory Visit
Admit: 2022-10-30 | Discharge: 2022-11-04 | Disposition: A | Discharge status: Home / Self Care | Payer: MEDICARE | Admitting: Internal Medicine

## 2022-10-30 DIAGNOSIS — K529 Noninfective gastroenteritis and colitis, unspecified: Principal | ICD-10-CM

## 2022-10-30 DIAGNOSIS — D849 Immunodeficiency, unspecified: Secondary | ICD-10-CM | POA: Diagnosis not present

## 2022-10-30 DIAGNOSIS — R918 Other nonspecific abnormal finding of lung field: Secondary | ICD-10-CM | POA: Diagnosis not present

## 2022-10-30 DIAGNOSIS — K5289 Other specified noninfective gastroenteritis and colitis: Secondary | ICD-10-CM | POA: Diagnosis not present

## 2022-10-30 DIAGNOSIS — T380X5A Adverse effect of glucocorticoids and synthetic analogues, initial encounter: Secondary | ICD-10-CM | POA: Diagnosis not present

## 2022-10-30 DIAGNOSIS — R9431 Abnormal electrocardiogram [ECG] [EKG]: Secondary | ICD-10-CM | POA: Diagnosis not present

## 2022-10-30 DIAGNOSIS — I10 Essential (primary) hypertension: Secondary | ICD-10-CM | POA: Diagnosis not present

## 2022-10-30 DIAGNOSIS — E612 Magnesium deficiency: Secondary | ICD-10-CM | POA: Diagnosis not present

## 2022-10-30 DIAGNOSIS — E871 Hypo-osmolality and hyponatremia: Secondary | ICD-10-CM | POA: Diagnosis not present

## 2022-10-30 DIAGNOSIS — Z7983 Long term (current) use of bisphosphonates: Secondary | ICD-10-CM | POA: Diagnosis not present

## 2022-10-30 DIAGNOSIS — K521 Toxic gastroenteritis and colitis: Secondary | ICD-10-CM | POA: Diagnosis not present

## 2022-10-30 DIAGNOSIS — Z794 Long term (current) use of insulin: Secondary | ICD-10-CM | POA: Diagnosis not present

## 2022-10-30 DIAGNOSIS — R739 Hyperglycemia, unspecified: Secondary | ICD-10-CM | POA: Diagnosis not present

## 2022-10-30 DIAGNOSIS — Z7982 Long term (current) use of aspirin: Secondary | ICD-10-CM | POA: Diagnosis not present

## 2022-10-30 DIAGNOSIS — I2510 Atherosclerotic heart disease of native coronary artery without angina pectoris: Secondary | ICD-10-CM | POA: Diagnosis not present

## 2022-10-30 DIAGNOSIS — E875 Hyperkalemia: Secondary | ICD-10-CM | POA: Diagnosis not present

## 2022-10-30 DIAGNOSIS — G4733 Obstructive sleep apnea (adult) (pediatric): Secondary | ICD-10-CM | POA: Diagnosis not present

## 2022-10-30 DIAGNOSIS — J189 Pneumonia, unspecified organism: Secondary | ICD-10-CM | POA: Diagnosis not present

## 2022-10-30 DIAGNOSIS — Z944 Liver transplant status: Secondary | ICD-10-CM | POA: Diagnosis not present

## 2022-10-30 DIAGNOSIS — K7469 Other cirrhosis of liver: Secondary | ICD-10-CM | POA: Diagnosis not present

## 2022-10-30 DIAGNOSIS — E1165 Type 2 diabetes mellitus with hyperglycemia: Secondary | ICD-10-CM | POA: Diagnosis not present

## 2022-10-30 DIAGNOSIS — Z952 Presence of prosthetic heart valve: Secondary | ICD-10-CM | POA: Diagnosis not present

## 2022-10-30 DIAGNOSIS — Z5181 Encounter for therapeutic drug level monitoring: Secondary | ICD-10-CM | POA: Diagnosis not present

## 2022-10-30 DIAGNOSIS — E7800 Pure hypercholesterolemia, unspecified: Secondary | ICD-10-CM | POA: Diagnosis not present

## 2022-10-30 DIAGNOSIS — E872 Acidosis, unspecified: Secondary | ICD-10-CM | POA: Diagnosis not present

## 2022-10-30 DIAGNOSIS — E43 Unspecified severe protein-calorie malnutrition: Secondary | ICD-10-CM | POA: Diagnosis not present

## 2022-10-30 DIAGNOSIS — R3589 Other polyuria: Secondary | ICD-10-CM | POA: Diagnosis not present

## 2022-10-30 DIAGNOSIS — E039 Hypothyroidism, unspecified: Secondary | ICD-10-CM | POA: Diagnosis not present

## 2022-10-30 DIAGNOSIS — Z800 Family history of malignant neoplasm of digestive organs: Secondary | ICD-10-CM | POA: Diagnosis not present

## 2022-10-30 DIAGNOSIS — D649 Anemia, unspecified: Secondary | ICD-10-CM | POA: Diagnosis not present

## 2022-10-30 DIAGNOSIS — Z7984 Long term (current) use of oral hypoglycemic drugs: Secondary | ICD-10-CM | POA: Diagnosis not present

## 2022-10-30 DIAGNOSIS — Z79890 Hormone replacement therapy (postmenopausal): Secondary | ICD-10-CM | POA: Diagnosis not present

## 2022-10-30 LAB — BASIC METABOLIC PANEL
ANION GAP: 12 mmol/L (ref 5–14)
BLOOD UREA NITROGEN: 22 mg/dL (ref 9–23)
BUN / CREAT RATIO: 24
CALCIUM: 8.3 mg/dL — ABNORMAL LOW (ref 8.7–10.4)
CHLORIDE: 103 mmol/L (ref 98–107)
CO2: 17 mmol/L — ABNORMAL LOW (ref 20.0–31.0)
CREATININE: 0.93 mg/dL
EGFR CKD-EPI (2021) MALE: >90 mL/min/{1.73_m2} (ref >=60)
GLUCOSE RANDOM: 273 mg/dL — ABNORMAL HIGH (ref 70–179)
POTASSIUM: 4.6 mmol/L (ref 3.4–4.8)
SODIUM: 132 mmol/L — ABNORMAL LOW (ref 135–145)

## 2022-10-30 LAB — BLOOD GAS, VENOUS
BASE EXCESS VENOUS: -5.4 — ABNORMAL LOW (ref -2.0–2.0)
BASE EXCESS VENOUS: -7.4 — ABNORMAL LOW (ref -2.0–2.0)
BASE EXCESS VENOUS: -6.5 — ABNORMAL LOW (ref -2.0–2.0)
HCO3 VENOUS: 21 mmol/L — ABNORMAL LOW (ref 22–27)
HCO3 VENOUS: 19 mmol/L — ABNORMAL LOW (ref 22–27)
HCO3 VENOUS: 19 mmol/L — ABNORMAL LOW (ref 22–27)
O2 SATURATION VENOUS: 24.9 % — ABNORMAL LOW (ref 40.0–85.0)
O2 SATURATION VENOUS: 39.5 % — ABNORMAL LOW (ref 40.0–85.0)
O2 SATURATION VENOUS: 74.3 % (ref 40.0–85.0)
PCO2 VENOUS: 45 mmHg (ref 40–60)
PCO2 VENOUS: 39 mmHg — ABNORMAL LOW (ref 40–60)
PCO2 VENOUS: 36 mmHg — ABNORMAL LOW (ref 40–60)
PH VENOUS: 7.28 — ABNORMAL LOW (ref 7.32–7.43)
PH VENOUS: 7.29 — ABNORMAL LOW (ref 7.32–7.43)
PH VENOUS: 7.33 (ref 7.32–7.43)
PO2 VENOUS: <31 mmHg (ref 30–55)
PO2 VENOUS: <31 mmHg (ref 30–55)
PO2 VENOUS: 45 mmHg (ref 30–55)

## 2022-10-30 LAB — URINALYSIS WITH MICROSCOPY WITH CULTURE REFLEX PERFORMABLE
BACTERIA: NONE SEEN /HPF
BILIRUBIN UA: NEGATIVE
GLUCOSE UA: >1000 — AB
KETONES UA: 40 — AB
LEUKOCYTE ESTERASE UA: NEGATIVE
NITRITE UA: NEGATIVE
PH UA: 5 (ref 5.0–9.0)
RBC UA: <1 /HPF (ref <=3)
SPECIFIC GRAVITY UA: 1.034 — ABNORMAL HIGH (ref 1.003–1.030)
SQUAMOUS EPITHELIAL: <1 /HPF (ref 0–5)
UROBILINOGEN UA: <2
WBC UA: <1 /HPF (ref <=2)

## 2022-10-30 LAB — D-DIMER, QUANTITATIVE: D-DIMER QUANTITATIVE (CW,ML,HL,HS,CH,JS,JC,RX,RH): 881 ng{FEU}/mL — ABNORMAL HIGH (ref <=500)

## 2022-10-30 LAB — COMPREHENSIVE METABOLIC PANEL
ALBUMIN: 3.2 g/dL — ABNORMAL LOW (ref 3.4–5.0)
ALKALINE PHOSPHATASE: 109 U/L (ref 46–116)
ALT (SGPT): 15 U/L (ref 10–49)
ANION GAP: 12 mmol/L (ref 5–14)
AST (SGOT): 15 U/L (ref <=34)
BILIRUBIN TOTAL: 0.5 mg/dL (ref 0.3–1.2)
BLOOD UREA NITROGEN: 21 mg/dL (ref 9–23)
BUN / CREAT RATIO: 19
CALCIUM: 9 mg/dL (ref 8.7–10.4)
CHLORIDE: 98 mmol/L (ref 98–107)
CO2: 20 mmol/L (ref 20.0–31.0)
CREATININE: 1.09 mg/dL
EGFR CKD-EPI (2021) MALE: 78 mL/min/{1.73_m2} (ref >=60)
GLUCOSE RANDOM: 429 mg/dL — ABNORMAL HIGH (ref 70–179)
POTASSIUM: 5.5 mmol/L — ABNORMAL HIGH (ref 3.4–4.8)
PROTEIN TOTAL: 7.3 g/dL (ref 5.7–8.2)
SODIUM: 130 mmol/L — ABNORMAL LOW (ref 135–145)

## 2022-10-30 LAB — CBC W/ AUTO DIFF
BASOPHILS ABSOLUTE COUNT: 0 10*9/L (ref 0.0–0.1)
BASOPHILS RELATIVE PERCENT: 0.3 %
EOSINOPHILS ABSOLUTE COUNT: 0 10*9/L (ref 0.0–0.5)
EOSINOPHILS RELATIVE PERCENT: 0 %
HEMATOCRIT: 34.5 % — ABNORMAL LOW (ref 39.0–48.0)
HEMOGLOBIN: 11.4 g/dL — ABNORMAL LOW (ref 12.9–16.5)
LYMPHOCYTES ABSOLUTE COUNT: 1 10*9/L — ABNORMAL LOW (ref 1.1–3.6)
LYMPHOCYTES RELATIVE PERCENT: 6.3 %
MEAN CORPUSCULAR HEMOGLOBIN CONC: 33.1 g/dL (ref 32.0–36.0)
MEAN CORPUSCULAR HEMOGLOBIN: 31.3 pg (ref 25.9–32.4)
MEAN CORPUSCULAR VOLUME: 94.5 fL (ref 77.6–95.7)
MEAN PLATELET VOLUME: 7.3 fL (ref 6.8–10.7)
MONOCYTES ABSOLUTE COUNT: 0.8 10*9/L (ref 0.3–0.8)
MONOCYTES RELATIVE PERCENT: 4.9 %
NEUTROPHILS ABSOLUTE COUNT: 13.9 10*9/L — ABNORMAL HIGH (ref 1.8–7.8)
NEUTROPHILS RELATIVE PERCENT: 88.5 %
PLATELET COUNT: 365 10*9/L (ref 150–450)
RED BLOOD CELL COUNT: 3.65 10*12/L — ABNORMAL LOW (ref 4.26–5.60)
RED CELL DISTRIBUTION WIDTH: 17.1 % — ABNORMAL HIGH (ref 12.2–15.2)
WBC ADJUSTED: 15.7 10*9/L — ABNORMAL HIGH (ref 3.6–11.2)

## 2022-10-30 LAB — HIGH SENSITIVITY TROPONIN I - SINGLE: HIGH SENSITIVITY TROPONIN I: 9 ng/L (ref <=53)

## 2022-10-30 LAB — LACTATE SEPSIS, VENOUS: LACTATE BLOOD VENOUS: 1.8 mmol/L (ref 0.5–1.8)

## 2022-10-30 LAB — BETA HYDROXYBUTYRATE: BETA-HYDROXYBUTYRATE: 2.99 mmol/L — ABNORMAL HIGH (ref 0.02–0.27)

## 2022-10-30 MED ORDER — PREDNISONE 20 MG TABLET
ORAL_TABLET | Freq: Every day | ORAL | 0 refills | 30 days | Status: CP
Start: 2022-10-30 — End: 2022-11-29

## 2022-10-30 MED ADMIN — sodium chloride 0.9% (NS) bolus 1,000 mL: 1000 mL | INTRAVENOUS | @ 23:00:00 | Stop: 2022-10-30

## 2022-10-30 NOTE — Unmapped (Signed)
Patient called to report that he has not been feeling well for the past 3 days, citing constant dry, hacky cough, frequent urination and some altered mentation. He reports he burned yard waste the other day without a mask. Patient denies fever, but states he has occ.chills, some sob, no diarrhea, but c/o of some RLQ abd pain. SPO2 at 93-96% during this call. He reports blood sugars have been ~200-250 in AM and as high as 500+ at PM, currently at 339. He confirmed he decreased his prednisone to 30mg  last weekend as instructed by this [facility].  Encouraged him to f/up with his pcp today, but he could not get an appt. Patient said he called the endocrinology office before today to provide his glucose data, but has not heard back from them yet. Reviewed last week's endocrinology visit documentation, which noted the possibility of starting him on insulin, so sent msg to Dr.Hwang for assistance, but was unable to reach her. Paged Dr.Pham, who recommended patient be seen in ED, preferably [place], to r/o DKA with mental status changes.     Per NP Harms, patient can reduce pred again tomorrow to 20mg  and repeat labs next week with plan to further reduce to 10mg  if diarrhea does not recur. Spoke with patient and relayed dose taper on pred to 20mg  and Dr.Pham's recommendation for ED evaluation. He said he would have assistance to come to [facility] now for evaluation.

## 2022-10-30 NOTE — Unmapped (Signed)
Pt LM stating that his sugars have continued to rise, that he was getting readings in the afternoon of 500+. Pt called and he states that his liver MD wants him to go be seen in ED today. Pt feels like this is appropriate and is going this afternoon.     Msg sent to Dr Dell Ponto.

## 2022-10-30 NOTE — Unmapped (Addendum)
Prednisone plan: Today decrease prednisone 20 mg daily, recheck labs a week after decrease,  if no diarrhea and labs are ok then decrease to 10 mg. Check labs in a week, if no diarrhea and labs ok then decrease to 10 mg daily and stop Dapsone. Plan to slowly taper off

## 2022-10-30 NOTE — Unmapped (Signed)
Glucometer reading HIGH at home. Hx DM2. C/o polyuria, polydipsia

## 2022-10-30 NOTE — Unmapped (Addendum)
Takes Jardiance for DM and no problems taking this med. No fevers. Some pain on urination. Mild right flank pain. Poor taste but no food changes.

## 2022-10-31 DIAGNOSIS — T380X5A Adverse effect of glucocorticoids and synthetic analogues, initial encounter: Secondary | ICD-10-CM | POA: Diagnosis not present

## 2022-10-31 DIAGNOSIS — K5289 Other specified noninfective gastroenteritis and colitis: Secondary | ICD-10-CM | POA: Diagnosis not present

## 2022-10-31 DIAGNOSIS — R9431 Abnormal electrocardiogram [ECG] [EKG]: Secondary | ICD-10-CM | POA: Diagnosis not present

## 2022-10-31 DIAGNOSIS — Z944 Liver transplant status: Secondary | ICD-10-CM | POA: Diagnosis not present

## 2022-10-31 DIAGNOSIS — R739 Hyperglycemia, unspecified: Secondary | ICD-10-CM | POA: Diagnosis not present

## 2022-10-31 DIAGNOSIS — D72829 Elevated white blood cell count, unspecified: Secondary | ICD-10-CM | POA: Insufficient documentation

## 2022-10-31 LAB — CBC W/ AUTO DIFF
BASOPHILS ABSOLUTE COUNT: 0 10*9/L (ref 0.0–0.1)
BASOPHILS RELATIVE PERCENT: 0.2 %
EOSINOPHILS ABSOLUTE COUNT: 0 10*9/L (ref 0.0–0.5)
EOSINOPHILS RELATIVE PERCENT: 0.4 %
HEMATOCRIT: 31.7 % — ABNORMAL LOW (ref 39.0–48.0)
HEMOGLOBIN: 10.2 g/dL — ABNORMAL LOW (ref 12.9–16.5)
LYMPHOCYTES ABSOLUTE COUNT: 1.5 10*9/L (ref 1.1–3.6)
LYMPHOCYTES RELATIVE PERCENT: 13.8 %
MEAN CORPUSCULAR HEMOGLOBIN CONC: 32.3 g/dL (ref 32.0–36.0)
MEAN CORPUSCULAR HEMOGLOBIN: 30.3 pg (ref 25.9–32.4)
MEAN CORPUSCULAR VOLUME: 93.7 fL (ref 77.6–95.7)
MEAN PLATELET VOLUME: 6.6 fL — ABNORMAL LOW (ref 6.8–10.7)
MONOCYTES ABSOLUTE COUNT: 0.8 10*9/L (ref 0.3–0.8)
MONOCYTES RELATIVE PERCENT: 7.6 %
NEUTROPHILS ABSOLUTE COUNT: 8.6 10*9/L — ABNORMAL HIGH (ref 1.8–7.8)
NEUTROPHILS RELATIVE PERCENT: 78 %
PLATELET COUNT: 355 10*9/L (ref 150–450)
RED BLOOD CELL COUNT: 3.38 10*12/L — ABNORMAL LOW (ref 4.26–5.60)
RED CELL DISTRIBUTION WIDTH: 17.3 % — ABNORMAL HIGH (ref 12.2–15.2)
WBC ADJUSTED: 11.1 10*9/L (ref 3.6–11.2)

## 2022-10-31 LAB — BLOOD GAS, VENOUS
BASE EXCESS VENOUS: -4.6 — ABNORMAL LOW (ref -2.0–2.0)
BASE EXCESS VENOUS: -7.1 — ABNORMAL LOW (ref -2.0–2.0)
HCO3 VENOUS: 21 mmol/L — ABNORMAL LOW (ref 22–27)
HCO3 VENOUS: 18 mmol/L — ABNORMAL LOW (ref 22–27)
O2 SATURATION VENOUS: 46.9 % (ref 40.0–85.0)
O2 SATURATION VENOUS: 68.5 % (ref 40.0–85.0)
PCO2 VENOUS: 42 mmHg (ref 40–60)
PCO2 VENOUS: 34 mmHg — ABNORMAL LOW (ref 40–60)
PH VENOUS: 7.32 (ref 7.32–7.43)
PH VENOUS: 7.34 (ref 7.32–7.43)
PO2 VENOUS: <31 mmHg (ref 30–55)
PO2 VENOUS: 42 mmHg (ref 30–55)

## 2022-10-31 LAB — BILIRUBIN, DIRECT: BILIRUBIN DIRECT: 0.28 mg/dL (ref 0.00–0.40)

## 2022-10-31 LAB — BASIC METABOLIC PANEL
ANION GAP: 9 mmol/L (ref 5–14)
ANION GAP: 10 mmol/L (ref 5–14)
BLOOD UREA NITROGEN: 19 mg/dL (ref 9–23)
BLOOD UREA NITROGEN: 26 mg/dL — ABNORMAL HIGH (ref 9–23)
BUN / CREAT RATIO: 23
BUN / CREAT RATIO: 31
CALCIUM: 7.3 mg/dL — ABNORMAL LOW (ref 8.7–10.4)
CALCIUM: 9 mg/dL (ref 8.7–10.4)
CHLORIDE: 109 mmol/L — ABNORMAL HIGH (ref 98–107)
CHLORIDE: 102 mmol/L (ref 98–107)
CO2: 19 mmol/L — ABNORMAL LOW (ref 20.0–31.0)
CO2: 19 mmol/L — ABNORMAL LOW (ref 20.0–31.0)
CREATININE: 0.82 mg/dL
CREATININE: 0.83 mg/dL
EGFR CKD-EPI (2021) MALE: >90 mL/min/{1.73_m2} (ref >=60)
EGFR CKD-EPI (2021) MALE: >90 mL/min/{1.73_m2} (ref >=60)
GLUCOSE RANDOM: 155 mg/dL (ref 70–179)
GLUCOSE RANDOM: 269 mg/dL — ABNORMAL HIGH (ref 70–179)
POTASSIUM: 4.1 mmol/L (ref 3.4–4.8)
POTASSIUM: 4.7 mmol/L (ref 3.4–4.8)
SODIUM: 137 mmol/L (ref 135–145)
SODIUM: 131 mmol/L — ABNORMAL LOW (ref 135–145)

## 2022-10-31 LAB — MAGNESIUM
MAGNESIUM: 1.8 mg/dL (ref 1.6–2.3)
MAGNESIUM: 2 mg/dL (ref 1.6–2.6)

## 2022-10-31 LAB — COMPREHENSIVE METABOLIC PANEL
ALBUMIN: 3.7 g/dL — ABNORMAL LOW (ref 3.8–4.9)
ALKALINE PHOSPHATASE: 101 IU/L (ref 44–121)
ALT (SGPT): 16 IU/L (ref 0–44)
AST (SGOT): 9 IU/L (ref 0–40)
BILIRUBIN TOTAL (MG/DL) IN SER/PLAS: 0.6 mg/dL (ref 0.0–1.2)
BLOOD UREA NITROGEN: 18 mg/dL (ref 8–27)
BUN / CREAT RATIO: 15 (ref 10–24)
CALCIUM: 8.9 mg/dL (ref 8.6–10.2)
CHLORIDE: 95 mmol/L — ABNORMAL LOW (ref 96–106)
CO2: 19 mmol/L — ABNORMAL LOW (ref 20–29)
CREATININE: 1.21 mg/dL (ref 0.76–1.27)
EGFR: 69 mL/min/{1.73_m2}
GLOBULIN, TOTAL: 2.8 g/dL (ref 1.5–4.5)
GLUCOSE: 294 mg/dL — ABNORMAL HIGH (ref 70–99)
POTASSIUM: 5.1 mmol/L (ref 3.5–5.2)
SODIUM: 131 mmol/L — ABNORMAL LOW (ref 134–144)
TOTAL PROTEIN: 6.5 g/dL (ref 6.0–8.5)

## 2022-10-31 LAB — PROTIME-INR
INR: 1.17
PROTIME: 13 s — ABNORMAL HIGH (ref 9.9–12.6)

## 2022-10-31 LAB — HEPATIC FUNCTION PANEL
ALBUMIN: 2.6 g/dL — ABNORMAL LOW (ref 3.4–5.0)
ALKALINE PHOSPHATASE: 88 U/L (ref 46–116)
ALT (SGPT): 12 U/L (ref 10–49)
AST (SGOT): 11 U/L (ref <=34)
BILIRUBIN DIRECT: 0.1 mg/dL (ref 0.00–0.30)
BILIRUBIN TOTAL: 0.3 mg/dL (ref 0.3–1.2)
PROTEIN TOTAL: 6.4 g/dL (ref 5.7–8.2)

## 2022-10-31 LAB — CBC W/ DIFFERENTIAL
BANDED NEUTROPHILS ABSOLUTE COUNT: 0.2 10*3/uL — ABNORMAL HIGH (ref 0.0–0.1)
BASOPHILS ABSOLUTE COUNT: 0.1 10*3/uL (ref 0.0–0.2)
BASOPHILS RELATIVE PERCENT: 0 %
EOSINOPHILS ABSOLUTE COUNT: 0 10*3/uL (ref 0.0–0.4)
EOSINOPHILS RELATIVE PERCENT: 0 %
HEMATOCRIT: 35 % — ABNORMAL LOW (ref 37.5–51.0)
HEMOGLOBIN: 11.6 g/dL — ABNORMAL LOW (ref 13.0–17.7)
IMMATURE GRANULOCYTES: 1 %
LYMPHOCYTES ABSOLUTE COUNT: 2.1 10*3/uL (ref 0.7–3.1)
LYMPHOCYTES RELATIVE PERCENT: 12 %
MEAN CORPUSCULAR HEMOGLOBIN CONC: 33.1 g/dL (ref 31.5–35.7)
MEAN CORPUSCULAR HEMOGLOBIN: 31.1 pg (ref 26.6–33.0)
MEAN CORPUSCULAR VOLUME: 94 fL (ref 79–97)
MONOCYTES ABSOLUTE COUNT: 1.2 10*3/uL — ABNORMAL HIGH (ref 0.1–0.9)
MONOCYTES RELATIVE PERCENT: 7 %
NEUTROPHILS ABSOLUTE COUNT: 13.3 10*3/uL — ABNORMAL HIGH (ref 1.4–7.0)
NEUTROPHILS RELATIVE PERCENT: 80 %
PLATELET COUNT: 384 10*3/uL (ref 150–450)
RED BLOOD CELL COUNT: 3.73 x10E6/uL — ABNORMAL LOW (ref 4.14–5.80)
RED CELL DISTRIBUTION WIDTH: 14.5 % (ref 11.6–15.4)
WHITE BLOOD CELL COUNT: 17 10*3/uL — ABNORMAL HIGH (ref 3.4–10.8)

## 2022-10-31 LAB — SLIDE REVIEW

## 2022-10-31 LAB — PHOSPHORUS
PHOSPHORUS, SERUM: 2.4 mg/dL — ABNORMAL LOW (ref 2.8–4.1)
PHOSPHORUS: 3.8 mg/dL (ref 2.4–5.1)

## 2022-10-31 LAB — GAMMA GT: GAMMA GLUTAMYL TRANSFERASE: 44 IU/L (ref 0–65)

## 2022-10-31 MED ADMIN — tacrolimus (PROGRAF) capsule 3 mg: 3 mg | ORAL | @ 15:00:00

## 2022-10-31 MED ADMIN — insulin lispro (HumaLOG) injection 0-20 Units: 0-20 [IU] | SUBCUTANEOUS | @ 20:00:00

## 2022-10-31 MED ADMIN — metoPROLOL tartrate (Lopressor) tablet 25 mg: 25 mg | ORAL | @ 12:00:00

## 2022-10-31 MED ADMIN — cefTRIAXone (ROCEPHIN) 1 g in sodium chloride 0.9 % (NS) 100 mL IVPB-MBP: 1 g | INTRAVENOUS | @ 01:00:00 | Stop: 2022-10-30

## 2022-10-31 MED ADMIN — dapsone tablet 100 mg: 100 mg | ORAL | @ 12:00:00

## 2022-10-31 MED ADMIN — insulin lispro (HumaLOG) injection 6 Units: 6 [IU] | SUBCUTANEOUS | @ 01:00:00 | Stop: 2022-10-30

## 2022-10-31 MED ADMIN — tacrolimus (PROGRAF) capsule 3 mg: 3 mg | ORAL | @ 03:00:00

## 2022-10-31 MED ADMIN — azithromycin (ZITHROMAX) tablet 500 mg: 500 mg | ORAL | @ 15:00:00 | Stop: 2022-11-03

## 2022-10-31 MED ADMIN — sertraline (ZOLOFT) tablet 50 mg: 50 mg | ORAL | @ 12:00:00

## 2022-10-31 MED ADMIN — doxycycline (VIBRAMYCIN) 100 mg in sodium chloride 0.9 % (NS) 100 mL IVPB-MBP: 100 mg | INTRAVENOUS | @ 01:00:00 | Stop: 2022-10-30

## 2022-10-31 MED ADMIN — insulin lispro (HumaLOG) injection 0-20 Units: 0-20 [IU] | SUBCUTANEOUS | @ 16:00:00

## 2022-10-31 MED ADMIN — pioglitazone (ACTOS) tablet 15 mg: 15 mg | ORAL | @ 20:00:00

## 2022-10-31 MED ADMIN — pravastatin (PRAVACHOL) tablet 40 mg: 40 mg | ORAL | @ 22:00:00

## 2022-10-31 MED ADMIN — levothyroxine (SYNTHROID) tablet 175 mcg: 175 ug | ORAL | @ 12:00:00

## 2022-10-31 MED ADMIN — predniSONE (DELTASONE) tablet 20 mg: 20 mg | ORAL | @ 12:00:00

## 2022-10-31 MED ADMIN — insulin lispro (HumaLOG) injection 0-20 Units: 0-20 [IU] | SUBCUTANEOUS | @ 04:00:00

## 2022-10-31 MED ADMIN — lactated ringers bolus 1,000 mL: 1000 mL | INTRAVENOUS | @ 01:00:00 | Stop: 2022-10-30

## 2022-10-31 MED ADMIN — aspirin tablet 325 mg: 325 mg | ORAL | @ 12:00:00

## 2022-10-31 NOTE — Unmapped (Signed)
[facility]   Initial Consultation         Assessment and Recommendations:   Anthony Alvarado is a 62 y.o. male with cryptogenic cirrhosis s/p LT on tacro and MMF c/b drug-induced (mycophenolate) colitis, T2DM who presented to [facility] with hyperglycemia. The patient is seen in consultation at the request of Anell Barr, MD ([facility] ([facility])) for  colitis .    MMF-induced colitis, improved  Presented initially with hematochezia and diarrhea in July, colonoscopy consistent with drug-induced (mycophenolate) colitis based on endoscopic appearance and biopsies. Received one dose of infliximab and IV steroids transitioned to PO steroids which are being tapered outpatient.    Started 20mg  predisone today. Recommend a quicker taper which should help control hyperglycemia. Can reduce prednisone by 5mg  every 3-5d until off pending he has no further diarrhea. Dapsone can also be discontinued at this time.    Recommendations discussed with the patient's primary team. We will sign-off at this time, please re-contact if additional questions or a new need for consultation arises.    Subjective:   Admitted for hyperglycemia in setting of chronic steroids. Reported feeling fatigued, out of it, with cough as well. No f/c, no abdominal pain or cramping, no n/v, states diarrhea improved. Today had a mixed solid-liquid BM but other than that they've been solid. Small amount of blood on toilet paper when wiped this morning, h/o hemorrhoid, hgb stable.  Was tapering steroids by 10mg  weekly. Today is first day of 20mg .    -I have reviewed the patient's prior records from [facility] as summarized in the HPI    Objective:   Temp:  [36.7 ??C (98 ??F)-36.9 ??C (98.4 ??F)] 36.7 ??C (98 ??F)  Heart Rate:  [70-81] 79  SpO2 Pulse:  [67-82] 79  Resp:  [16-30] 20  BP: (118-147)/(78-98) 129/78  SpO2:  [92 %-99 %] 94 %    Gen: WDWN male in NAD, answers questions appropriately  Abdomen: Soft, ND, diffuse nonspecific soreness, no rebound/guarding  Extremities: No edema in the BLEs    Pertinent Labs/Studies:  -I have reviewed the patient's labs from 09/07 which show stable Hgb, WBC 17, normal Cr, elevated glucose 420 -> 266    CXR with RLL opacity atelectasis vs PNA    Flex sig 10/02/22:  Findings:       Hemorrhoids were found on perianal exam.       Solid stool was found in the sigmoid colon, precluding visualization.       The rectum, recto-sigmoid colon and sigmoid colon otherwise appeared        normal.                 Colonoscopy 09/09/22  Findings:       Prolapsed hemorrhoids were found during perianal exam. The hemorrhoids        were large.       Severe mucosal changes characterized by congestion (edema), erythema,        friability, deep ulcerations and shallow ulcerations were found        throughout the entire colon. Biopsies were taken with a cold forceps for        histology.       The terminal ileum appeared normal.  Bx: severely active colitis with ulceration

## 2022-10-31 NOTE — Unmapped (Signed)
Internal Medicine (MEDU) History & Physical    Assessment & Plan:   Anthony Alvarado is a 62 y.o. male whose presentation is complicated by cryptogenic cirrhosis s/p liver transplant (08/2018), T2DM, OSA, AS s/p AVR, CAD s/p CABG, hypothyroidism, and recent hospitalization for colitis  that presented to [facility] with steroid induced hyperglycemia.     Principal Problem:    Steroid-induced hyperglycemia  Active Problems:    Liver transplant recipient (CMS-HCC)    Diabetes mellitus without complication (CMS-HCC)    Hypothyroidism    OSA (obstructive sleep apnea)    Essential hypertension    Pure hypercholesterolemia    Colitis    Leukocytosis    Hyponatremia with increased serum osmolality        Active Problems    Steroid-Induced Hyperglycemia - T2DM  During his most recent hospitalization for colitis hyperglycemia was managed with sliding scale insulin.  His Jardiance dose was increased to 25 mg daily.  His current diabetes regimen is Jardiance 25 mg daily and pioglitazone 30 mg daily.  Metformin was held after discharge in the setting of resolving colitis.  He saw his outpatient endocrinologist 8/26 who was considering a small dose of daily insulin (5 to 10 units of NPH in the morning) to help with postprandial hyperglycemia. On admission, glucose was as 420, patient had elevated beta hydroxybutyrate, had ketones in his urine, and was mildly acidotic with pH of 7.28, but did not have an anion gap.  Patient was given 2L total isotonic fluid and 6u insulin, and on repeat VBG his pH had normalized, and glucose down trended to the 200s.  - Endocrinology consult in AM  - HOLD Jardiance  - HOLD Pioglitazone  - BMP, VBG q6h overnight, then transition to daily if improving  - q4h SSI, POCT glucose checks    C/f Pneumonia - Leukocytosis  Patient has been afebrile and hemodynamically stable since arrival to ED, noted to have dry cough, and some chills at home.  On admission he was found to have a new leukocytosis with a white count of 15.7, of note he has not previously had a leukocytosis while on steroids. CXR with RLL opacity c/f atelectasis vs developing pneumonia. UA without evidence of infection. RPP negative. Ddx for this patient's leukocytosis includes pneumonia versus steroid-induced leukocytosis.  Due to immunocompromise status, blood cultures were sent.  Patient was given 2 L of isotonic fluid, ceftriaxone, and doxycycline due to concern for pneumonia.  Given immunocompromised status, will continue empiric treatment of pneumonia. If patient's clinical status worsens could consider broadening antibiotics to treat HAP.  - s/p CTX, doxycycline  - Continue Ceftriaxone  - Start azithromcyin  - daily CBC    Cryptogenic Cirrhosis s/p Liver Transplant (08/2018)  Patient received liver transplant in 2020.  CellCept was discontinued during most recent hospitalization, and tacrolimus dose was increased.  - Consider Transplant Hepatology consult in AM  - Continue dapsone  - Continue tacrolimus 3 mg BID  - Daily BMP, HFP, Mg, Phos, INR    Hypertonic Hyponatremia  Iso hyperglycemia, corrected Na is wnl.   - trend BMP  - correct hyperglycemia (see above)    Colitis   Patient was hospitalized 09/2022 with colitis requiring infliximab and prednisone.    - Continue prednisone 20 mg daily      Chronic Problems  OSA: consider CPAP if needed  Hypothyroidism: continue levothyroxine 175 mcg daily  HTN:  continue metoprolol  Psych: continue sertraline  Normocytic Anemia: hgb improved from baseline, trend  daily   CAD s/p CABG: continue aspirin, pravastatin     The patient's presentation is complicated by the following clinically significant conditions requiring additional evaluation and treatment: - Hyperkalemia POA requiring further investigation, treatment, or monitoring  - Acidosis POA requiring further investigation, treatment, or monitoring         Issues Impacting Complexity of Management:  -Need for the following intensive monitoring parameter(s) due to high risk of clinical decline: q4h POCT glucose      Medical Decision Making: Reviewed records from the following unique sources  previous hospitalization.      Checklist:  Diet: Regular Diet  DVT PPx: Lovenox 40mg  q24h  Code Status: Full Code  Dispo: Patient appropriate for Inpatient based on expectation of ongoing need for hospitalization greater than two midnights based on severity of presentation/services including infection in transplant patient and significant hyperglycemia    Team Contact Information:   Primary Team: Internal Medicine (MEDU)  Primary Resident: Marja Kays, MD  Resident's Pager: [phone] [facility] Senior Resident)    Chief Concern:   Steroid-induced hyperglycemia      Subjective:   Anthony Alvarado is a 62 y.o. male with pertinent PMHx of cryptogenic cirrhosis s/p liver transplant (08/2018), T2DM, OSA, AS s/p AVR, CAD s/p CABG, hypothyroidism, and recent hospitalization for colitis presenting with hyperglycemia s/p initiation of steroids for colitis.         History obtained from patient.       HPI:  Patient was instructed to present to the ED by his transplant coordinator for significant hyperglycemia after being started on steroids during his recent hospitalization for colitis.  Since discharge patient reports feeling off.  He has noticed that his morning fasting sugar is normal in the 130s, but when he started taking his sugars in the afternoon they have been quite high, sometimes greater than 600.    He has had issues with hyperglycemia previously when he was on steroids, and did require an injectable medication, but has never been hospitalized for DKA or HHS, and has not required insulin drip to his knowledge.    In addition to the high sugars, he has had a dry cough and some shortness of breath.  No nausea or vomiting.  No known fevers but some chills and sweats.  He has had increased urinary frequency and and has noticed he is more thirsty.  He has chronic slight burning in his urine.    Chart Review:  Patient was hospitalized 8/3 - 8/10 for colitis.  Colonoscopy 7/17 with severe mucosal changes consistent with active colitis.  He was started on IV steroids and IV infliximab.  He also had a significant drop in his hemoglobin requiring a blood transfusion and was fluid responsive.  His diarrhea improved after starting infliximab and steroids.  He received IV iron.  Blood in stool had resolved at time of discharge.  He was started on dapsone for PJP prophylaxis as needed hyperkalemia with Bactrim.  For cirrhosis CellCept was discontinued permanently and the tacrolimus dose was increased.  For his type 2 diabetes his [place] Pepper was increased, as he was hyperglycemic in the 300s throughout hospitalization requiring sliding scale insulin.      Pertinent Surgical Hx  Liver transplant 08/2018  appendectomy    Pertinent Family Hx  Asthma  Thyroid disease  Liver disease    Pertinent Social Hx   Does not currently use EtOH    Allergies  Mycophenolate mofetil and Watermelon flavor    I  reviewed the Medication List. The current list is Accurate  Prior to Admission medications    Medication Dose, Route, Frequency   aspirin 325 MG tablet 325 mg, Oral, Daily (standard)   azelastine (ASTELIN) 137 mcg (0.1 %) nasal spray 1 spray, Each Nare, Daily PRN   blood sugar diagnostic (ONETOUCH ULTRA TEST) Strp USE TO CHECK SUGAR DAILY   calcium carbonate (TUMS) 200 mg calcium (500 mg) chewable tablet 1 tablet, Oral, Daily (standard), Pt reports taking every other day.   dapsone 100 MG tablet 100 mg, Oral, Daily (standard)   empagliflozin (JARDIANCE) 25 mg tablet 25 mg, Oral, Daily (standard)   fluticasone propionate (FLONASE) 50 mcg/actuation nasal spray PLACE ONE OR TWO SPRAYS INTO BOTH NOSTRILS DAILY AS NEEDED.   hydrocortisone 1 % cream Apply topically to hemorrhoids up to twice a day   levothyroxine (SYNTHROID) 175 MCG tablet 175 mcg, Oral, Daily before breakfast   metoPROLOL tartrate (LOPRESSOR) 25 MG tablet 25 mg, Oral, 2 times a day (standard)   pioglitazone (ACTOS) 15 MG tablet 30 mg, Oral, Daily (standard), For the first month, take 15 mg once a day. If feeling fine then can increase to 30 mg once a day.   pravastatin (PRAVACHOL) 40 MG tablet 40 mg, Oral, Every evening   predniSONE (DELTASONE) 20 MG tablet 20 mg, Oral, Daily   sertraline (ZOLOFT) 50 MG tablet 50 mg, Oral, Daily (standard)   tacrolimus (PROGRAF) 1 MG capsule 3 mg, Oral, 2 times a day   alendronate (FOSAMAX) 70 MG tablet Take 1 tablet (70 mg total) by mouth Every Sunday. Take with a full glass of water on an empty stomach.  Patient taking differently: Take 1 tablet (70 mg total) by mouth Every Sunday. Pt reports that he has missed a few doses.       Designated Environmental health practitioner:  [name] currently has decisional capacity for healthcare decision-making and is able to designate a surrogate healthcare decision maker. Mr. Ferner's designated healthcare decision maker(s) is/are Donnie Coffin (separated spouse) and Leonidas Romberg (daughter) as denoted by stated patient preference.    Objective:   Physical Exam:  Temp:  [36.7 ??C (98.1 ??F)-36.9 ??C (98.4 ??F)] 36.9 ??C (98.4 ??F)  Heart Rate:  [73-81] 73  SpO2 Pulse:  [67-80] 67  Resp:  [16-30] 22  BP: (118-143)/(78-98) 143/83  SpO2:  [94 %-99 %] 98 %    Physical Exam  Constitutional:       General: He is not in acute distress.     Appearance: Normal appearance. He is not ill-appearing or toxic-appearing.   HENT:      Head: Normocephalic and atraumatic.   Eyes:      General: No scleral icterus.  Cardiovascular:      Rate and Rhythm: Normal rate and regular rhythm.      Heart sounds: No murmur heard.  Pulmonary:      Effort: Pulmonary effort is normal.      Breath sounds: Normal breath sounds and air entry.   Abdominal:      General: Bowel sounds are normal. There is distension.      Palpations: Abdomen is soft.      Tenderness: There is abdominal tenderness (mild) in the right lower quadrant, suprapubic area and left lower quadrant. There is no guarding or rebound.   Musculoskeletal:      Right lower leg: No edema.      Left lower leg: No edema.   Skin:     Coloration: Skin  is not jaundiced.   Neurological:      General: No focal deficit present.      Mental Status: He is alert. Mental status is at baseline.

## 2022-10-31 NOTE — Unmapped (Signed)
[facility] Emergency Department Provider Note    Patient Name: Anthony Alvarado  Chief Complaint: Elevated Blood Glucose Symptomatic       ED Clinical Impression     Final diagnoses:   Hyperglycemia (Primary)   Pneumonia of right lower lobe due to infectious organism        Impression, ED Course, Assessment and Plan   MDM  This is a 62 year old male with a medical history of T2DM, cryptogenic cirrhosis SP liver transplant on tacrolimus, CAD, presenting to the emergency department secondary to a 5-day history of malaise associated with polyuria, polydipsia, dehydration, cough.  Of note, patient was discharged on 10/03/2022 for colitis most likely secondary to mycophenolate, currently on a prednisone taper.  Patient reports improvement in diarrheal symptoms.  On exam, patient is alert, x 3, no significant distress.  Evidence of dry mucous membranes.  Otherwise, abdominal examinations benign without evidence of rebound, guarding, rigidity, or tenderness in all 4 quadrants.  No CVA tenderness.  Patient started on 1 L normal saline bolus.    Laboratory studies significant for leukocytosis, WBC 15.7.  Hemoglobin 11.4, which appears to be at his baseline.  Evidence of mild hyperkalemia, potassium 5.5, creatinine 1.09.  Bicarb at 20.0.  Evidence of hyperglycemia, glucose 429 without anion gap.  D-dimer ordered by triage protocol mildly elevated at 881, however decreased from 2020.  Urinalysis shows negative leukocyte esterase, negative nitrite, blood trace, WBCs less than 1.  Venous blood gas shows pH 7.28, pCO2 45, HCO3 21.    EKG shows sinus rhythm at a rate of 78 bpm.  No significant ST segment elevations/depressions noted.  Nonspecific T wave inversion, biphasic T wave noted in lead III, aVF.  Compared to prior ECGs, appear morphologically similar.  Otherwise, normal intervals.  No acute ectopic beats.  Plain film of chest shows right lower lobe opacity, atelectasis versus pneumonia.  Well's score for PE 0. Although leukocytosis, hyperglycemia may be secondary to steroid taper, due to recent hospitalization, cough, immunosuppressed status, patient started on IV antibiotics, will admit for further monitoring of symptoms.  Do not believe patient currently requires I insulin given patient has no evidence of altered medical status, abdominal pain, nausea/vomiting, and appears non-toxic.  Patient given a dose of insulin lispro, additional 1 L lactated ringer bolus and will be admitted to further management of CAP, hyperglycemia.     Discussion of Management with other Physicians, QHP or Appropriate Source: Hospitalist  Independent Interpretation of Studies: CXR, labs, EKG  External Records Reviewed: Patient's most recent outpatient clinic note  Escalation of Care, Consideration of Admission/Observation/Transfer: N/a  Social determinants that significantly affected care: None applicable  Prescription drug(s) considered but not prescribed: N/A  Diagnostic tests considered but not performed: CT PE  History obtained from other sources: None       I have reviewed the vital signs and the nursing notes. Labs and radiology results that were available during my care of the patient were independently reviewed by me and considered in my medical decision making.     I reviewed the patient's prior medical records.  I independently visualized the EKG tracing.  I independently visualized the radiology images.      Disposition:   The patient was admitted in stable condition.       History   History obtained from: Patient   This is a 62 year old male with a medical history of liver transplant on tacrolimus, T2DM, hypothyroidism, hypertension, CAD, presenting to the emergency department secondary  to a 5-day history of malaise associated with polyuria, polydipsia, dehydration.  Patient discussed with his transplant coordinator, noted his blood glucose level to be elevated at 500 and instructed to present to the emergency department today. Patient reports cough which he attributes to burning yard waste several days prior without a mask.  Of note, patient denies fever, chills, syncope, loss consciousness, chest pain, abdominal pain, vomiting, melena, dysuria, hematuria.    Past Medical/Past Surgical History:   Reviewed in EHR including nursing documentation as outlined. Pertinent PMH/PSH also noted above in HPI.   Past Medical History:   Diagnosis Date    Aortic valve stenosis     Autoimmune cholangitis     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    Cholestatic cirrhosis (CMS-HCC)     Hepatic encephalopathy (CMS-HCC)     Hypertension     Other osteoporosis without current pathological fracture     Sleep apnea     Type 2 diabetes mellitus (CMS-HCC)      Patient Active Problem List   Diagnosis    Diabetes mellitus without complication (CMS-HCC)    Hypothyroidism    Cirrhosis of liver without ascites (CMS-HCC)    OSA (obstructive sleep apnea)    Essential hypertension    Encounter for pre-transplant evaluation for chronic liver disease    Pleural effusion    Pneumothorax after biopsy    Liver transplant recipient (CMS-HCC)    Allergic rhinitis    Arthralgia    Asthma    Bicuspid aortic valve    Flying phobia    Mucosal abnormality of stomach    Nephrolithiasis    Pure hypercholesterolemia    Umbilical hernia    Unspecified hearing loss    COVID-19    Colitis    Hemorrhoids, external    Steatosis of liver    Steroid-induced hyperglycemia    Leukocytosis    Hyponatremia with increased serum osmolality     Past Surgical History:   Procedure Laterality Date    APPENDECTOMY      CHG US GUIDE, TISSUE ABLATION N/A 05/03/2018    Procedure: ULTRASOUND GUIDANCE FOR, AND MONITORING OF, PARENCHYMAL TISSUE ABLATION;  Surgeon: Particia Nearing, MD;  Location: MAIN OR [facility];  Service: Transplant    LIVER TRANSPLANTATION  09/16/2018    PR CATH PLACE/CORON ANGIO, IMG SUPER/INTERP,R&L HRT CATH, L HRT VENTRIC N/A 03/21/2018    Procedure: Left/Right Heart Catheterization;  Surgeon: Neal Dy, MD;  Location: [facility] CATH;  Service: Cardiology    PR COLONOSCOPY W/BIOPSY SINGLE/MULTIPLE N/A 09/09/2022    Procedure: COLONOSCOPY, FLEXIBLE, PROXIMAL TO SPLENIC FLEXURE; WITH BIOPSY, SINGLE OR MULTIPLE;  Surgeon: Jules Husbands, MD;  [facility]: [facility];  Service: Gastroenterology    PR DECORTICATION,PULMONARY,TOTAL Right 01/13/2019    Procedure: Decortic Pulm (Separt Proc); Tot;  Surgeon: Evert Kohl, MD;  Location: MAIN OR [facility];  Service: Thoracic    PR EGD FLEXIBLE FOREIGN BODY REMOVAL N/A 12/26/2018    Procedure: UGI ENDOSCOPY; W/REMOVAL FOREIGN BODY;  Surgeon: Vonda Antigua, MD;  Location: [facility];  Service: Gastroenterology    PR LAP,ABLAT 1+ LIVER TUMOR(S),RADIOFREQ N/A 05/03/2018    Procedure: LAPAROSCOPY, SURGICAL, ABLATION OF 1 OR MORE LIVER TUMOR(S); RADIOFREQUENCY;  Surgeon: Particia Nearing, MD;  Location: MAIN OR [place];  Service: Transplant    PR PERQ DRAINAGE PLEURA INSERT CATH W/IMAGING N/A 01/11/2019    Procedure: PLEURAL DRAINAGE, PERC, W INSERTION OF INDWELLING CATHETER; W  IMAGING GUIDANCE;  Surgeon: Jerelyn Charles, MD;  Location: BRONCH PROCEDURE LAB [facility];  Service: Pulmonary    PR SIGMOIDOSCOPY FLX DX W/COLLJ SPEC BR/WA IF PFRMD N/A 10/02/2022    Procedure: SIGMOIDOSCOPY, FLEXIBLE; DIAGNOSTIC, WITH OR WITHOUT COLLECTION OF SPECIMEN(S) BY BRUSHING OR WASHING;  Surgeon: Cira Servant, DO;  [facility]: [facility];  Service: Gastroenterology    PR THORACENTESIS NEEDLE/CATH PLEURA W/IMAGING N/A 01/06/2019    Procedure: THORACENTESIS W/ IMAGING;  Surgeon: Cleora Fleet, MD;  Location: BRON[facility] PROCEDURE LAB [facility];  Service: Pulmonary    PR THORACOSCOPY SURG W/PLEURODESIS Right 01/13/2019    Procedure: THORACOSCOPY, SURGICAL; WITH PLEURODESIS (EG, MECHANICAL OR CHEMICAL);  Surgeon: Evert Kohl, MD;  Location: MAIN OR [facility];  Service: Thoracic    PR TRANSPLANT LIVER,ALLOTRANSPLANT Bilateral 09/16/2018    Procedure: LIVER ALLOTRANSPLANTATION; ORTHOTOPIC, PARTIAL OR WHOLE, FROM CADAVER OR LIVING DONOR, ANY AGE;  Surgeon: Florene Glen, MD;  Location: MAIN OR [place];  Service: Transplant    PR TRANSPLANT,PREP DONOR LIVER, WHOLE N/A 09/16/2018    Procedure: BACKBNCH STD PREP CAD DONOR WHOLE LIVER GFT PRIOR TNSPLNT,INC CHOLE,DISS/REM SURR TISSU WO TRISEG/LOBE SPLT;  Surgeon: Florene Glen, MD;  Location: MAIN OR [place];  Service: Transplant    SKIN BIOPSY         Social History/Family History:   Reviewed in EMR, I agree with nursing documentation. Additional pertinent social and family history noted in HPI.   Social History     Tobacco Use    Smoking status: Never    Smokeless tobacco: Never   Vaping Use    Vaping status: Never Used   Substance Use Topics    Alcohol use: Not Currently    Drug use: Never     Family History   Problem Relation Age of Onset    Asthma Mother     Hearing loss Mother     COPD Father     Cancer Father     Autoimmune disease Sister         AIH    Heart disease Sister     Colon cancer Maternal Grandfather     Liver disease Sister     Thyroid disease Sister     Thyroid disease Sister     Melanoma Neg Hx     Basal cell carcinoma Neg Hx     Squamous cell carcinoma Neg Hx        Home Medications:    Current Facility-Administered Medications:     acetaminophen (TYLENOL) tablet 500 mg, 500 mg, Oral, Q6H PRN, Szumel, Francine Graven, MD    aspirin tablet 325 mg, 325 mg, Oral, Daily, Szumel, Francine Graven, MD, 325 mg at 11/01/22 0808    azithromycin (ZITHROMAX) tablet 500 mg, 500 mg, Oral, Q24H SCH, Szumel, Francine Graven, MD, 500 mg at 11/01/22 1206    cefTRIAXone (ROCEPHIN) 1 g in sodium chloride 0.9 % (NS) 100 mL IVPB-MBP, 1 g, Intravenous, Q24H, Szumel, Francine Graven, MD, Stopped at 11/01/22 2054    dapsone tablet 100 mg, 100 mg, Oral, Daily, Szumel, Francine Graven, MD, 100 mg at 11/01/22 0808    dextrose (D10W) 10% bolus 125 mL, 12.5 g, Intravenous, Q10 Min PRN, Szumel, Francine Graven, MD    enoxaparin (LOVENOX) syringe 40 mg, 40 mg, Subcutaneous, Q24H, Szumel, Francine Graven, MD, 40 mg at 11/01/22 2018    glucagon injection 1 mg, 1 mg, Intramuscular, Once PRN, Marja Kays, MD    glucose chewable tablet 16 g, 16 g,  Oral, Q10 Min PRN, Ileene Musa, MD    guaiFENesin (ROBITUSSIN) oral syrup, 200 mg, Oral, Q4H PRN, Marja Kays, MD, 200 mg at 11/01/22 2138    insulin lispro (HumaLOG) injection 0-20 Units, 0-20 Units, Subcutaneous, Q4H, Szumel, Francine Graven, MD, 2 Units at 11/02/22 0010    insulin NPH (HumuLIN,NovoLIN) injection 10 Units, 10 Units, Subcutaneous, Daily, Ileene Musa, MD, 10 Units at 11/01/22 1103    levothyroxine (SYNTHROID) tablet 175 mcg, 175 mcg, Oral, Daily before breakfast, Marja Kays, MD, 175 mcg at 11/01/22 0743    melatonin tablet 3 mg, 3 mg, Oral, Nightly PRN, Marja Kays, MD    metoPROLOL tartrate (Lopressor) tablet 25 mg, 25 mg, Oral, BID, Szumel, Francine Graven, MD, 25 mg at 11/01/22 2018    pioglitazone (ACTOS) tablet 15 mg, 15 mg, Oral, Daily, [place]elrigg, Patrick, MD, 15 mg at 11/01/22 1610    pravastatin (PRAVACHOL) tablet 40 mg, 40 mg, Oral, QPM, Szumel, Francine Graven, MD, 40 mg at 11/01/22 1806    predniSONE (DELTASONE) tablet 20 mg, 20 mg, Oral, Daily, Szumel, Francine Graven, MD, 20 mg at 11/01/22 0745    senna (SENOKOT) tablet 2 tablet, 2 tablet, Oral, Nightly PRN, Marja Kays, MD    sertraline (ZOLOFT) tablet 50 mg, 50 mg, Oral, Daily, Szumel, Francine Graven, MD, 50 mg at 11/01/22 9604    tacrolimus (PROGRAF) capsule 3 mg, 3 mg, Oral, BID, Szumel, Francine Graven, MD, 3 mg at 11/01/22 2018    Facility-Administered Medications Ordered in Other Encounters:     diazePAM (VALIUM) 5 mg/mL injection, , , ,     ALLERGIES:   Mycophenolate mofetil and Watermelon flavor       Physical Exam     ED Triage Vitals   Enc Vitals Group      BP 10/30/22 1637 137/82      Heart Rate 10/30/22 1636 81      SpO2 Pulse 10/30/22 1811 80      Resp 10/30/22 1636 16      Temp 10/30/22 1636 36.7 ??C (98.1 ??F)      Temp Source 10/30/22 1811 Oral      SpO2 10/30/22 1637 99 %      Weight 10/30/22 1637 75.8 kg (167 lb)      Height --       Head Circumference --       Peak Flow --       Pain Score --       Pain Loc --       Pain Education --       Exclude from Growth Chart --        Vital signs reviewed.  GEN: AOx3. Well-nourished, well-developed. No apparent distress. Speaking in full sentences.  EYES: EOM grossly intact. Pupils round and reactive. No scleral icterus. Clear conjunctiva.  HEAD/NECK /MOUTH: [place]/AT. No pharyngeal erythema. Dry mucous membranes.  CARDIO: RRR. No peripheral edema. Normal capillary refill.  LUNG: CTAB. No retractions or accessory muscle use  GI: Non-tender. Non-distended. No masses, rigidity, guarding or rebound.  MSK: Full range of motion. Moving all extremities. No swelling or deformity.  SKIN: Intact. No rashes. No lesions. No hematomas, no ecchymosis, no petechiae.  NEURO: AOx3. No facial asymmetry; no focal motor or sensory deficits.  PSYCH: Awake and alert. Appropriate mood and affect.      LABS AND ED TREATMENT:   Lab reviewed and interpreted by me as above in MDM.   Medications given in the ED:  Medications   aspirin tablet 325 mg (325 mg Oral Given 11/01/22 0808)   dapsone tablet 100 mg (100 mg Oral Given 11/01/22 0808)   levothyroxine (SYNTHROID) tablet 175 mcg (175 mcg Oral Given 11/01/22 0743)   metoPROLOL tartrate (Lopressor) tablet 25 mg (25 mg Oral Given 11/01/22 2018)   pravastatin (PRAVACHOL) tablet 40 mg (40 mg Oral Given 11/01/22 1806)   predniSONE (DELTASONE) tablet 20 mg (20 mg Oral Given 11/01/22 0745)   sertraline (ZOLOFT) tablet 50 mg (50 mg Oral Given 11/01/22 0808)   tacrolimus (PROGRAF) capsule 3 mg (3 mg Oral Given 11/01/22 2018)   melatonin tablet 3 mg (has no administration in time range)   senna (SENOKOT) tablet 2 tablet (has no administration in time range)   guaiFENesin (ROBITUSSIN) oral syrup (200 mg Oral Given 11/01/22 2138)   enoxaparin (LOVENOX) syringe 40 mg (40 mg Subcutaneous Given 11/01/22 2018)   dextrose (D10W) 10% bolus 125 mL (has no administration in time range)   glucagon injection 1 mg (has no administration in time range)   acetaminophen (TYLENOL) tablet 500 mg (has no administration in time range)   cefTRIAXone (ROCEPHIN) 1 g in sodium chloride 0.9 % (NS) 100 mL IVPB-MBP (0 g Intravenous Stopped 11/01/22 2054)   azithromycin (ZITHROMAX) tablet 500 mg (500 mg Oral Given 11/01/22 1206)   pioglitazone (ACTOS) tablet 15 mg (15 mg Oral Given 11/01/22 0808)   insulin NPH (HumuLIN,NovoLIN) injection 10 Units (10 Units Subcutaneous Given 11/01/22 1103)   glucose chewable tablet 16 g (has no administration in time range)   insulin lispro (HumaLOG) injection 0-20 Units (2 Units Subcutaneous Given 11/02/22 0010)   sodium chloride 0.9% (NS) bolus 1,000 mL (0 mL Intravenous Stopped 10/30/22 1934)   cefTRIAXone (ROCEPHIN) 1 g in sodium chloride 0.9 % (NS) 100 mL IVPB-MBP (0 g Intravenous Stopped 10/30/22 2121)   doxycycline (VIBRAMYCIN) 100 mg in sodium chloride 0.9 % (NS) 100 mL IVPB-MBP (0 mg Intravenous Stopped 10/30/22 2221)   insulin lispro (HumaLOG) injection 6 Units (6 Units Subcutaneous Given 10/30/22 2121)   lactated ringers bolus 1,000 mL (0 mL Intravenous Stopped 10/30/22 2136)   insulin lispro (HumaLOG) injection 3 Units (3 Units Subcutaneous Given 10/31/22 2223)   insulin NPH (HumuLIN,NovoLIN) injection 10 Units (10 Units Subcutaneous Given 11/01/22 1805)   insulin lispro (HumaLOG) injection 3 Units (3 Units Subcutaneous Given 11/01/22 2140)          Radiology     XR Chest Portable   Final Result      Right lower lung opacity, which may reflect atelectasis. Developing pneumonia not excluded.          I independently visualized the radiology images.     Portions of this record have been created using Scientist, clinical (histocompatibility and immunogenetics). Dictation errors have been sought, but may not have been identified and corrected. Sabino Gasser, MD  11/02/22 [phone]

## 2022-10-31 NOTE — Unmapped (Shared)
[facility]  Emergency Department Provider Note     {** REMINDER - THIS NOTE IS NOT A FINAL MEDICAL RECORD UNTIL IT IS SIGNED.  UNTIL THEN, THE CONTENT BELOW MAY REFLECT INFORMATION FROM A DOCUMENTATION TEMPLATE, NOT THE ACTUAL PATIENT VISIT. **}      ED Clinical Impression      Final diagnoses:   None            Impression, Medical Decision Making, Progress Notes and Critical Care      Impression, Differential Diagnosis and Plan of Care    Anthony Alvarado is a 62 y.o. male PMHx of liver transplant on tacro, recent hospitalization for colitis pw high blood sugar, so the nurse told me to come in. Patient is immunocompromised 2/2 liver transplant on tacrolimus endorsing 3 days of cough, chills, fatigue with CXR concerning for RLL consolidation, and leukocytosis makes pneumonia the most likely etiology at this time. Elevated blood glucose could be due to infectious process, recent steroid use. Patient does not have anion gap, altered mental status which makes concern for DKA lower but still necessary to rule-out at this time. HHS also considered but less likely given glucose level, mental status intact. Other dx considered include steroid induced hyperglycemia, deficient home sugar regimen.    Plan:  -CBC, CMP, VBG, Ddimer, lactate  -CXR, UA, beta hydroxybutyrate   -NS 1L bolus x2  -Ceftriaxone, doxy empiric treatment      Independent Interpretation of Studies    I have independently interpreted the following studies:  EKG: {**normal sinus rhythm, no st elevations/depressions, normal intervals, normal axis **}  X-ray(s): {**trachea midline, no bony abnormalities, RLL opacity/consolidation, cardiac silhouette wnl, diaphragmatic angles intact**}    Discussion of Management with other Providers or Support Staff    I discussed the management of this patient with the:  Admitting provider: {** time/admitting service AND name of provider/planned inpatient workup & management **}  Consultant(s): {** time/consulting service AND name of provider/summary of discussion **}  Radiologist: {** time/name of radiologist/study discussed/summary of discussion **}  ED Pharmacist: {** time/name of pharmacist/summary of discussion **}  Case Management/Social Work: {** time/reason for [employer] of discussion **}  Other: {** time/who you spoke with/summary of discussion **}    Considerations Regarding Disposition/Escalation of Care and Critical Care    Indications for observation/admission (or consideration of observation/admission) and/or appropriateness for outpatient management: {** time/pertinent +/- test results, response to treatment, likely diagnosis(es) and factors supporting discharge or reasons admission is necessary **}  Patient/Family/Caregiver Discussions: {** time/understanding of plan of care, need for admission/discharge plan, code status discussions/decision to de-escalate care because of poor prognosis **}  Diagnostic Tests Considered But Not Done: ***  Prescription Drugs Provided or Considered But Not Given: {** Antibiotics, Antivirals, Pain medications, etc. **}  Social Determinants of Health which significantly affected care: {** Homelessness, Transportation, Insurance, Ability to [employer] Medication/Care, etc. ***}  {** Use '.PROCDOC' to document critical care within this note - include total time **}    Additional Progress Notes    {** .EDCOURSE or .TIMESTAMP/progress note **}    Portions of this record have been created using Scientist, clinical (histocompatibility and immunogenetics). Dictation errors have been sought, but may not have been identified and corrected.    See chart and resident provider documentation for details.    ____________________________________________         History        Reason for Visit  Elevated Blood Glucose Symptomatic      HPI  Anthony Alvarado is a 62 y.o. male PMHx of liver transplant on tacro, recent hospitalization for colitis pw high blood sugar, so the nurse told me to come in. Patient stated he was doing a regular checkin with a transplant nurse when he reported that his home glucose reading was >500, decreased oxygen to 93 or 94 so he was advised to present to the ED. Patient endorsed feeling slower than normal in terms of cognitive function as well as R flank pain and trouble catching his breath. The R flank pain does not radiate and feels tight when patient walks. Patient reported that he has developed a cough with deep breaths for ~3 days after he burned some brush without a mask. Patient also endorsed chills but denied fever, congestion, sore throat.    Patient denied active chest pain, difficulty breathing, bloody urine, bloody stools, nausea, or vomiting.      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    Cholestatic cirrhosis (CMS-HCC)     Hepatic encephalopathy (CMS-HCC)     Hypertension     Other osteoporosis without current pathological fracture     Sleep apnea     Type 2 diabetes mellitus (CMS-HCC)        Patient Active Problem List   Diagnosis    Diabetes mellitus without complication (CMS-HCC)    Hypothyroidism    Cirrhosis of liver without ascites (CMS-HCC)    OSA (obstructive sleep apnea)    Essential hypertension    Encounter for pre-transplant evaluation for chronic liver disease    Pleural effusion    Pneumothorax after biopsy    Liver transplant recipient (CMS-HCC)    Allergic rhinitis    Arthralgia    Asthma    Bicuspid aortic valve    Flying phobia    Mucosal abnormality of stomach    Nephrolithiasis    Pure hypercholesterolemia    Umbilical hernia    Unspecified hearing loss    COVID-19    Colitis    Hemorrhoids, external    Steatosis of liver       Past Surgical History: Procedure Laterality Date    APPENDECTOMY      CHG US GUIDE, TISSUE ABLATION N/A 05/03/2018    Procedure: ULTRASOUND GUIDANCE FOR, AND MONITORING OF, PARENCHYMAL TISSUE ABLATION;  Surgeon: Particia Nearing, MD;  Location: MAIN OR [facility];  Service: Transplant    LIVER TRANSPLANTATION  09/16/2018    PR CATH PLACE/CORON ANGIO, IMG SUPER/INTERP,R&L HRT CATH, L HRT VENTRIC N/A 03/21/2018    Procedure: Left/Right Heart Catheterization;  Surgeon: Neal Dy, MD;  Location: [name] CATH;  Service: Cardiology    PR COLONOSCOPY W/BIOPSY SINGLE/MULTIPLE N/A 09/09/2022    Procedure: COLONOSCOPY, FLEXIBLE, PROXIMAL TO SPLENIC FLEXURE; WITH BIOPSY, SINGLE OR MULTIPLE;  Surgeon: Jules Husbands, MD;  Location: GI PROCEDURES [facility];  Service: Gastroenterology    PR DECORTICATION,PULMONARY,TOTAL Right 01/13/2019    Procedure: Decortic Pulm (Separt Proc); Tot;  Surgeon: Evert Kohl, MD;  Location: MAIN OR [facility];  Service: Thoracic    PR EGD FLEXIBLE FOREIGN BODY REMOVAL N/A 12/26/2018    Procedure: UGI ENDOSCOPY; W/REMOVAL FOREIGN BODY;  Surgeon: Vonda Antigua, MD;  Location: [facility]URES [facility];  Service: Gastroenterology    PR LAP,ABLAT 1+ LIVER TUMOR(S),RADIOFREQ N/A 05/03/2018    Procedure: LAPAROSCOPY, SURGICAL, ABLATION OF 1 OR MORE LIVER TUMOR(S); RADIOFREQUENCY;  Surgeon: Particia Nearing, MD;  Location: MAIN OR [place];  Service: Transplant    PR PERQ DRAINAGE PLEURA INSERT CATH W/IMAGING N/A 01/11/2019    Procedure: PLEURAL DRAINAGE, PERC, W INSERTION OF INDWELLING CATHETER; W IMAGING GUIDANCE;  Surgeon: Jerelyn Charles, MD;  Location: BRONCH PROCEDURE LAB [facility];  Service: Pulmonary    PR SIGMOIDOSCOPY FLX DX W/COLLJ SPEC BR/WA IF PFRMD N/A 10/02/2022    Procedure: SIGMOIDOSCOPY, FLEXIBLE; DIAGNOSTIC, WITH OR WITHOUT COLLECTION OF SPECIMEN(S) BY BRUSHING OR WASHING;  Surgeon: Cira Servant, DO;  Location: [facility];  Service: Gastroenterology PR THORACENTESIS NEEDLE/CATH PLEURA W/IMAGING N/A 01/06/2019    Procedure: THORACENTESIS W/ IMAGING;  Surgeon: Cleora Fleet, MD;  Location: BRONCH PROCEDURE LAB [facility];  Service: Pulmonary    PR THORACOSCOPY SURG W/PLEURODESIS Right 01/13/2019    Procedure: THORACOSCOPY, SURGICAL; WITH PLEURODESIS (EG, MECHANICAL OR CHEMICAL);  Surgeon: Evert Kohl, MD;  Location: MAIN OR [facility];  Service: Thoracic    PR TRANSPLANT LIVER,ALLOTRANSPLANT Bilateral 09/16/2018    Procedure: LIVER ALLOTRANSPLANTATION; ORTHOTOPIC, PARTIAL OR WHOLE, FROM CADAVER OR LIVING DONOR, ANY AGE;  Surgeon: Florene Glen, MD;  Location: MAIN OR [place];  Service: Transplant    PR TRANSPLANT,PREP DONOR LIVER, WHOLE N/A 09/16/2018    Procedure: BACKBNCH STD PREP CAD DONOR WHOLE LIVER GFT PRIOR TNSPLNT,INC CHOLE,DISS/REM SURR TISSU WO TRISEG/LOBE SPLT;  Surgeon: Florene Glen, MD;  Location: MAIN OR ;  Service: Transplant    SKIN BIOPSY           Current Facility-Administered Medications:     cefTRIAXone (ROCEPHIN) 1 g in sodium chloride 0.9 % (NS) 100 mL IVPB-MBP, 1 g, Intravenous, Once, Sabino Gasser, MD    doxycycline (VIBRAMYCIN) 100 mg in sodium chloride 0.9 % (NS) 100 mL IVPB-MBP, 100 mg, Intravenous, Once, Sabino Gasser, MD    Current Outpatient Medications:     aspirin 325 MG tablet, Take 1 tablet (325 mg total) by mouth daily., Disp: , Rfl:     azelastine (ASTELIN) 137 mcg (0.1 %) nasal spray, 1 spray into each nostril daily as needed., Disp: , Rfl:     blood sugar diagnostic (ONETOUCH ULTRA TEST) Strp, USE TO CHECK SUGAR DAILY, Disp: , Rfl:     calcium carbonate (TUMS) 200 mg calcium (500 mg) chewable tablet, Chew 1 tablet (500 mg total) daily. Pt reports taking every other day., Disp: , Rfl:     dapsone 100 MG tablet, Take 1 tablet (100 mg total) by mouth daily., Disp: 30 tablet, Rfl: 2    empagliflozin (JARDIANCE) 25 mg tablet, Take 1 tablet (25 mg total) by mouth daily., Disp: 30 tablet, Rfl: 0    fluticasone propionate (FLONASE) 50 mcg/actuation nasal spray, PLACE ONE OR TWO SPRAYS INTO BOTH NOSTRILS DAILY AS NEEDED., Disp: , Rfl:     hydrocortisone 1 % cream, Apply topically to hemorrhoids up to twice a day, Disp: 30 g, Rfl: 0    levothyroxine (SYNTHROID) 175 MCG tablet, Take 1 tablet (175 mcg total) by mouth daily before breakfast., Disp: 90 tablet, Rfl: 1    metoPROLOL tartrate (LOPRESSOR) 25 MG tablet, Take 1 tablet (25 mg total) by mouth two (2) times a day., Disp: 180 tablet, Rfl: 1    pioglitazone (ACTOS) 15 MG tablet, Take 2 tablets (30 mg total) by mouth  daily. For the first month, take 15 mg once a day. If feeling fine then can increase to 30 mg once a day., Disp: 60 tablet, Rfl: 11    pravastatin (PRAVACHOL) 40 MG tablet, Take 1 tablet (40 mg total) by mouth every evening., Disp: 30 tablet, Rfl: 1    predniSONE (DELTASONE) 20 MG tablet, Take 1 tablet (20 mg total) by mouth in the morning., Disp: 30 tablet, Rfl: 0    sertraline (ZOLOFT) 50 MG tablet, Take 1 tablet (50 mg total) by mouth daily., Disp: 90 tablet, Rfl: 3    tacrolimus (PROGRAF) 1 MG capsule, Take 3 capsules (3 mg total) by mouth two (2) times a day., Disp: 180 capsule, Rfl: 11    Facility-Administered Medications Ordered in Other Encounters:     diazePAM (VALIUM) 5 mg/mL injection, , , ,     Allergies  Mycophenolate mofetil and Watermelon flavor    Family History   Problem Relation Age of Onset    Asthma Mother     Hearing loss Mother     COPD Father     Cancer Father     Autoimmune disease Sister         AIH    Heart disease Sister     Colon cancer Maternal Grandfather     Liver disease Sister     Thyroid disease Sister     Thyroid disease Sister     Melanoma Neg Hx     Basal cell carcinoma Neg Hx     Squamous cell carcinoma Neg Hx        Social History  Social History     Tobacco Use    Smoking status: Never    Smokeless tobacco: Never   Vaping Use    Vaping status: Never Used   Substance Use Topics    Alcohol use: Not Currently    Drug use: Never          Physical Exam     ED Triage Vitals   Enc Vitals Group      BP 10/30/22 1637 137/82      Heart Rate 10/30/22 1636 81      SpO2 Pulse 10/30/22 1811 80      Resp 10/30/22 1636 16      Temp 10/30/22 1636 36.7 ??C (98.1 ??F)      Temp Source 10/30/22 1811 Oral      SpO2 10/30/22 1637 99 %      Weight 10/30/22 1637 75.8 kg (167 lb)      Height --       Head Circumference --       Peak Flow --       Pain Score --       Pain Loc --       Pain Education --       Exclude from Growth Chart --        Constitutional: Alert and oriented. Well appearing and in no distress.  Eyes: Conjunctivae are normal.  ENT       Head: Normocephalic and atraumatic.       Nose: No congestion.       Mouth/Throat: Mucous membranes are moist.       Neck: No stridor.  Cardiovascular: Normal rate, regular rhythm. Normal and symmetric distal pulses are present in all extremities.  Respiratory: Normal respiratory effort. Coarse breath sounds in RLL, dry cough with deep inspiration.  Gastrointestinal: Soft and nontender, distended abdomen. There is no CVA tenderness.  Genitourinary: deferred  Musculoskeletal: Spontaneously moving all four extremities.       Right lower leg: No tenderness or edema.       Left lower leg: No tenderness or edema.  Neurologic: Normal speech and language. No gross focal neurologic deficits are appreciated.  Skin: Skin is warm, dry and intact. No rash noted.  Psychiatric: Mood and affect are normal. Speech and behavior are normal.       Radiology     CXR     Procedures     {** Use .PROCDOC to document procedure(s) within this note. **}    Obbie Lewallen Sedor-Schiffhauer      I evaluated this patient with ***, MSIV. I attest that I have reviewed the student note and that the components of the history of the present illness, the physical exam, and the assessment and plan documented were performed by me or were performed in my presence by the student where I verified the documentation and performed (or re-performed) the exam and medical decision making.

## 2022-10-31 NOTE — Unmapped (Signed)
Tacrolimus Therapeutic Monitoring Pharmacy Note  Anthony Alvarado is a 62 y.o. male continuing tacrolimus.     Indication: Liver transplant     Date of Transplant:  08/2018       Prior Dosing Information: Home regimen 3 mg BID      See Below for Current Inpatient Dosing and Levels    Source(s) of information used to determine prior to admission dosing: Home Medication List, Clinic Note, or Fill HIstory    Goals:  Therapeutic Drug Levels  Tacrolimus trough goal:  4-6 ng/mL  per 8/9 consult note    Additional Clinical Monitoring/Outcomes  Monitor renal function (SCr and urine output) and liver function (LFTs)  Monitor for signs/symptoms of adverse events (e.g., hyperglycemia, hyperkalemia, hypomagnesemia, hypertension, headache, tremor)    Results:     Creatinine   Date Value Ref Range Status   10/30/2022 1.09 0.73 - 1.18 mg/dL Final   56/21/3086 5.78 0.76 - 1.27 mg/dL Final   46/96/2952 8.41 0.76 - 1.27 mg/dL Final        Pharmacokinetic Considerations and Significant Drug Interactions:  Concurrent hepatotoxic medications:  pravastatin (hm)    Concurrent CYP3A4 substrates/inhibitors: None identified at the time of this note  Concurrent nephrotoxic medications:  Bactrim, Jardiance (hm)      Assessment/Plan:  Continue current regimen of 3 mg BID     Follow-up  Tacrolimus Level:  DAILY between 0600-0800 PRIOR to morning dose.  Dose due 0800  A pharmacist will continue to monitor and recommend levels as appropriate    Meridee Score, PharmD

## 2022-10-31 NOTE — Unmapped (Addendum)
Anthony Alvarado is a 62 y.o. male whose presentation is complicated by cryptogenic cirrhosis s/p liver transplant (08/2018), T2DM, OSA, AS s/p AVR, CAD s/p CABG, hypothyroidism, and recent hospitalization for colitis  who presented to [facility] with hyperglycemia in setting of steroid use for colitis.  He was found to have possible CAP and started on insulin and now improved and stable to dc home with plan for PCP and transplant hepatology follow up.  Below are more details of his stay at [facility] according to problem:     Uncontrolled Type 2 DM with Steroid-Induced Hyperglycemia  Home diabetes regimen is Jardiance 25 mg daily and pioglitazone 30 mg daily. BG outpatient >500 and admitted for control.  Started on NPH and then mealtime with SSI used.  Now improving and has been educated by the DM education team.  Will plan on dc with *** and follow up with ***.      Community Acquired Pneumonia    CXR with RLL opacity c/f atelectasis vs developing pneumonia. Patient started on CAP treatment with CTX and azithromycin as a result. Other than cough patient has remained relatively asymptomatic without fever or SOB.   - Continue Ceftriaxone (EOT 9/11), azithromycin (EOT 9/9)     Hx of Cryptogenic Cirrhosis s/p Liver Transplant (08/2018) - MMF-induced colitis (improved)  Patient received liver transplant in 2020.  CellCept was discontinued during most recent hospitalization, and tacrolimus dose was increased. Hepatology consulted and will plan on ongoing steroid taper, home tacrolimus and outpatient labs and follow up.      Hypertonic Hyponatremia  Iso hyperglycemia, corrected Na is wnl.      Chronic Problems  Hypothyroidism: continue levothyroxine 175 mcg daily  HTN:  continue metoprolol  Psych: continue sertraline  Normocytic Anemia: hgb improved from baseline  CAD s/p CABG: continue aspirin, pravastatin

## 2022-11-01 DIAGNOSIS — R739 Hyperglycemia, unspecified: Secondary | ICD-10-CM | POA: Diagnosis not present

## 2022-11-01 DIAGNOSIS — T380X5A Adverse effect of glucocorticoids and synthetic analogues, initial encounter: Secondary | ICD-10-CM | POA: Diagnosis not present

## 2022-11-01 LAB — BLOOD GAS, VENOUS
BASE EXCESS VENOUS: -1.3 (ref -2.0–2.0)
HCO3 VENOUS: 25 mmol/L (ref 22–27)
O2 SATURATION VENOUS: 25.5 % — ABNORMAL LOW (ref 40.0–85.0)
PCO2 VENOUS: 49 mmHg (ref 40–60)
PH VENOUS: 7.32 (ref 7.32–7.43)
PO2 VENOUS: <31 mmHg (ref 30–55)

## 2022-11-01 LAB — HEPATIC FUNCTION PANEL
ALBUMIN: 2.6 g/dL — ABNORMAL LOW (ref 3.4–5.0)
ALKALINE PHOSPHATASE: 100 U/L (ref 46–116)
ALT (SGPT): 16 U/L (ref 10–49)
AST (SGOT): 16 U/L (ref <=34)
BILIRUBIN DIRECT: <0.1 mg/dL (ref 0.00–0.30)
BILIRUBIN TOTAL: 0.2 mg/dL — ABNORMAL LOW (ref 0.3–1.2)
PROTEIN TOTAL: 6.6 g/dL (ref 5.7–8.2)

## 2022-11-01 LAB — BASIC METABOLIC PANEL
ANION GAP: 13 mmol/L (ref 5–14)
ANION GAP: 7 mmol/L (ref 5–14)
BLOOD UREA NITROGEN: 30 mg/dL — ABNORMAL HIGH (ref 9–23)
BLOOD UREA NITROGEN: 29 mg/dL — ABNORMAL HIGH (ref 9–23)
BUN / CREAT RATIO: 33
BUN / CREAT RATIO: 30
CALCIUM: 8.9 mg/dL (ref 8.7–10.4)
CALCIUM: 8.7 mg/dL (ref 8.7–10.4)
CHLORIDE: 100 mmol/L (ref 98–107)
CHLORIDE: 102 mmol/L (ref 98–107)
CO2: 16 mmol/L — ABNORMAL LOW (ref 20.0–31.0)
CO2: 22 mmol/L (ref 20.0–31.0)
CREATININE: 0.92 mg/dL
CREATININE: 0.98 mg/dL
EGFR CKD-EPI (2021) MALE: >90 mL/min/{1.73_m2} (ref >=60)
EGFR CKD-EPI (2021) MALE: 88 mL/min/{1.73_m2} (ref >=60)
GLUCOSE RANDOM: 427 mg/dL (ref 70–179)
GLUCOSE RANDOM: 476 mg/dL (ref 70–179)
POTASSIUM: 4.4 mmol/L (ref 3.4–4.8)
POTASSIUM: 5.1 mmol/L — ABNORMAL HIGH (ref 3.4–4.8)
SODIUM: 129 mmol/L — ABNORMAL LOW (ref 135–145)
SODIUM: 131 mmol/L — ABNORMAL LOW (ref 135–145)

## 2022-11-01 LAB — CBC
HEMATOCRIT: 36.9 % — ABNORMAL LOW (ref 39.0–48.0)
HEMOGLOBIN: 11.7 g/dL — ABNORMAL LOW (ref 12.9–16.5)
MEAN CORPUSCULAR HEMOGLOBIN CONC: 31.8 g/dL — ABNORMAL LOW (ref 32.0–36.0)
MEAN CORPUSCULAR HEMOGLOBIN: 29.9 pg (ref 25.9–32.4)
MEAN CORPUSCULAR VOLUME: 94.2 fL (ref 77.6–95.7)
MEAN PLATELET VOLUME: 6.9 fL (ref 6.8–10.7)
PLATELET COUNT: 399 10*9/L (ref 150–450)
RED BLOOD CELL COUNT: 3.92 10*12/L — ABNORMAL LOW (ref 4.26–5.60)
RED CELL DISTRIBUTION WIDTH: 17 % — ABNORMAL HIGH (ref 12.2–15.2)
WBC ADJUSTED: 12.7 10*9/L — ABNORMAL HIGH (ref 3.6–11.2)

## 2022-11-01 LAB — PHOSPHORUS: PHOSPHORUS: 4.5 mg/dL (ref 2.4–5.1)

## 2022-11-01 LAB — MAGNESIUM: MAGNESIUM: 1.8 mg/dL (ref 1.6–2.6)

## 2022-11-01 MED ADMIN — insulin lispro (HumaLOG) injection 0-20 Units: 0-20 [IU] | SUBCUTANEOUS | @ 04:00:00

## 2022-11-01 MED ADMIN — aspirin tablet 325 mg: 325 mg | ORAL | @ 12:00:00

## 2022-11-01 MED ADMIN — insulin lispro (HumaLOG) injection 0-20 Units: 0-20 [IU] | SUBCUTANEOUS | @ 01:00:00

## 2022-11-01 MED ADMIN — sertraline (ZOLOFT) tablet 50 mg: 50 mg | ORAL | @ 12:00:00

## 2022-11-01 MED ADMIN — cefTRIAXone (ROCEPHIN) 1 g in sodium chloride 0.9 % (NS) 100 mL IVPB-MBP: 1 g | INTRAVENOUS | @ 01:00:00 | Stop: 2022-11-04

## 2022-11-01 MED ADMIN — predniSONE (DELTASONE) tablet 20 mg: 20 mg | ORAL | @ 12:00:00

## 2022-11-01 MED ADMIN — metoPROLOL tartrate (Lopressor) tablet 25 mg: 25 mg | ORAL | @ 12:00:00

## 2022-11-01 MED ADMIN — pravastatin (PRAVACHOL) tablet 40 mg: 40 mg | ORAL | @ 22:00:00

## 2022-11-01 MED ADMIN — insulin lispro (HumaLOG) injection 0-20 Units: 0-20 [IU] | SUBCUTANEOUS | @ 22:00:00 | Stop: 2022-11-01

## 2022-11-01 MED ADMIN — metoPROLOL tartrate (Lopressor) tablet 25 mg: 25 mg | ORAL | @ 01:00:00

## 2022-11-01 MED ADMIN — levothyroxine (SYNTHROID) tablet 175 mcg: 175 ug | ORAL | @ 12:00:00

## 2022-11-01 MED ADMIN — insulin NPH (HumuLIN,NovoLIN) injection 10 Units: 10 [IU] | SUBCUTANEOUS | @ 22:00:00 | Stop: 2022-11-01

## 2022-11-01 MED ADMIN — insulin NPH (HumuLIN,NovoLIN) injection 10 Units: 10 [IU] | SUBCUTANEOUS | @ 15:00:00

## 2022-11-01 MED ADMIN — insulin lispro (HumaLOG) injection 0-5 Units: 0-5 [IU] | SUBCUTANEOUS | @ 16:00:00 | Stop: 2022-11-01

## 2022-11-01 MED ADMIN — insulin lispro (HumaLOG) injection 3 Units: 3 [IU] | SUBCUTANEOUS | @ 02:00:00 | Stop: 2022-10-31

## 2022-11-01 MED ADMIN — enoxaparin (LOVENOX) syringe 40 mg: 40 mg | SUBCUTANEOUS | @ 01:00:00

## 2022-11-01 MED ADMIN — dapsone tablet 100 mg: 100 mg | ORAL | @ 12:00:00

## 2022-11-01 MED ADMIN — guaiFENesin (ROBITUSSIN) oral syrup: 200 mg | ORAL | @ 02:00:00

## 2022-11-01 MED ADMIN — azithromycin (ZITHROMAX) tablet 500 mg: 500 mg | ORAL | @ 16:00:00 | Stop: 2022-11-03

## 2022-11-01 MED ADMIN — insulin lispro (HumaLOG) injection 0-20 Units: 0-20 [IU] | SUBCUTANEOUS | @ 08:00:00 | Stop: 2022-11-01

## 2022-11-01 MED ADMIN — tacrolimus (PROGRAF) capsule 3 mg: 3 mg | ORAL | @ 12:00:00

## 2022-11-01 MED ADMIN — pioglitazone (ACTOS) tablet 15 mg: 15 mg | ORAL | @ 12:00:00

## 2022-11-01 MED ADMIN — tacrolimus (PROGRAF) capsule 3 mg: 3 mg | ORAL | @ 01:00:00

## 2022-11-01 NOTE — Unmapped (Signed)
Internal Medicine (MEDU) Progress Note    Assessment & Plan:   Anthony Alvarado is a 62 y.o. male whose presentation is complicated by cryptogenic cirrhosis s/p liver transplant (08/2018), T2DM, OSA, AS s/p AVR, CAD s/p CABG, hypothyroidism, and recent hospitalization for colitis  that presented to [facility] with hyperglycemia ISO steroid use for colitis, CAP.     Principal Problem:    Steroid-induced hyperglycemia  Active Problems:    Liver transplant recipient (CMS-HCC)    Diabetes mellitus without complication (CMS-HCC)    Hypothyroidism    OSA (obstructive sleep apnea)    Essential hypertension    Pure hypercholesterolemia    Colitis    Leukocytosis    Hyponatremia with increased serum osmolality        Active Problems    Steroid-Induced Hyperglycemia - T2DM  During his most recent hospitalization for colitis hyperglycemia was managed with sliding scale insulin.  His Jardiance dose was increased to 25 mg daily.  His current diabetes regimen is Jardiance 25 mg daily and pioglitazone 30 mg daily.  Metformin was held after discharge in the setting of resolving colitis.  He saw his outpatient endocrinologist 8/26 who was considering a small dose of daily insulin (5 to 10 units of NPH in the morning) to help with postprandial hyperglycemia. On admission, glucose was as 420, patient had elevated beta hydroxybutyrate, had ketones in his urine, and was mildly acidotic with pH of 7.28, but did not have an anion gap.  Patient was given 2L total isotonic fluid and 6u insulin, and on repeat VBG his pH had normalized, and glucose down trended. By day 1 of hospital admission his sugars have improved with total 8 units insulin. We will restart home meds and monitor for need for further sugar control, also suspect acute pneumonia may have contributed to higher sugars.   - Resume Jardiance  - Resume Pioglitazone  - BMP daily  - q4h SSI, POCT glucose checks    Pneumonia - Leukocytosis  Patient has been afebrile and hemodynamically stable since arrival to ED, noted to have dry cough, and some chills at home.  On admission he was found to have a new leukocytosis with a white count of 15.7, of note he has not previously had a leukocytosis while on steroids. CXR with RLL opacity c/f atelectasis vs developing pneumonia. UA without evidence of infection. RPP negative. Ddx for this patient's leukocytosis includes pneumonia versus steroid-induced leukocytosis.  Due to immunocompromise status, blood cultures were sent.  Patient was given 2 L of isotonic fluid, ceftriaxone, and doxycycline due to concern for pneumonia.  Given immunocompromised status, will continue empiric treatment of pneumonia. If patient's clinical status worsens could consider broadening antibiotics to treat HAP.  - s/p CTX, doxycycline  - Continue Ceftriaxone, azithromycin  - daily CBC     Cryptogenic Cirrhosis s/p Liver Transplant (08/2018) - MMF-induced colitis (improved)  Patient received liver transplant in 2020.  CellCept was discontinued during most recent hospitalization, and tacrolimus dose was increased.  - Hepatology consulted, appreciate recs:  Prednisone 20 mg daily for 5 days (9/7-9/11)  Prednisone 15 mg daily for 5 days (9/12-9/16)  Prednisone 10 mg daily for 5 days - (9/17-9/21)  Prednisone 5 mg daily for 5 days (9/22-9/26)  Prednisone 5 mg every other day for 5 days (3 doses) (9/27-9/31)  - Continue tacrolimus 3 mg BID  - Daily BMP, HFP, Mg, Phos, INR     Hypertonic Hyponatremia  Iso hyperglycemia, corrected Na is wnl.   -  trend BMP  - correct hyperglycemia (see above)        Chronic Problems  OSA: consider CPAP if needed  Hypothyroidism: continue levothyroxine 175 mcg daily  HTN:  continue metoprolol  Psych: continue sertraline  Normocytic Anemia: hgb improved from baseline, trend daily   CAD s/p CABG: continue aspirin, pravastatin      The patient's presentation is complicated by the following clinically significant conditions requiring additional evaluation and treatment: - Hyperkalemia POA requiring further investigation, treatment, or monitoring  - Acidosis POA requiring further investigation, treatment, or monitoring         Daily Checklist:  Diet: Regular Diet  DVT PPx: Lovenox 40mg  q24h  Electrolytes: No Repletion Needed  Code Status: Full Code  Dispo: Transfer to Floor    Team Contact Information:   Primary Team: Internal Medicine (MEDU)  Primary Resident: Luvenia Heller, MD, MD  Resident's Pager: [phone] (Gen MedU Intern - Alvester Morin)    Interval History:   No acute events overnight.    ROS: Denies headache, chest pain, shortness of breath, abdominal pain, nausea, vomiting.    Objective:   Temp:  [36.3 ??C (97.4 ??F)-36.9 ??C (98.4 ??F)] 36.9 ??C (98.4 ??F)  Heart Rate:  [70-80] 74  SpO2 Pulse:  [67-82] 73  Resp:  [17-30] 17  BP: (118-147)/(69-98) 126/69  SpO2:  [92 %-98 %] 96 %,   Intake/Output Summary (Last 24 hours) at 10/31/2022 1751  Last data filed at 10/30/2022 2221  Gross per 24 hour   Intake 2100 ml   Output --   Net 2100 ml   , No intake/output data recorded.    Gen: NAD, converses   HENT: atraumatic, normocephalic  Heart: RRR  Lungs: Coughing intermittently. +Crackles RLL. Otherwise CTAB  Abdomen: soft, NTND  Extremities: No edema

## 2022-11-01 NOTE — Unmapped (Signed)
Internal Medicine (MEDU) Progress Note    Assessment & Plan:   Anthony Alvarado is a 62 y.o. male whose presentation is complicated by cryptogenic cirrhosis s/p liver transplant (08/2018), T2DM, OSA, AS s/p AVR, CAD s/p CABG, hypothyroidism, and recent hospitalization for colitis  that presented to [facility] with hyperglycemia ISO steroid use for colitis, CAP.     Principal Problem:    Steroid-induced hyperglycemia  Active Problems:    Liver transplant recipient (CMS-HCC)    Diabetes mellitus without complication (CMS-HCC)    Hypothyroidism    OSA (obstructive sleep apnea)    Essential hypertension    Pure hypercholesterolemia    Colitis    Leukocytosis    Hyponatremia with increased serum osmolality        Active Problems    Steroid-Induced Hyperglycemia - T2DM  Current diabetes regimen is Jardiance 25 mg daily and pioglitazone 30 mg daily. Glucose as high as the 400s overnight requiring approximately 20U of SSI. In light of this will plan to go ahead and add 10U of NPH daily in the morning to assist with glucose control per outpatient endocrinologist's initial thoughts. Discussed with patient that as steroid he when he comes off of his steroid taper he should stop taking NPH.   - Cont Jardiance  - Cont Pioglitazone  - BMP daily  - q4h SSI, POCT glucose checks    Community Acquired Pneumonia    CXR with RLL opacity c/f atelectasis vs developing pneumonia. Patient started on CAP treatment with CTX and azithromycin as a result. Other than cough patient has remained relatively asymptomatic without fever or SOB.   - Continue Ceftriaxone (EOT 9/11), azithromycin (EOT 9/9)  - daily CBC     Cryptogenic Cirrhosis s/p Liver Transplant (08/2018) - MMF-induced colitis (improved)  Patient received liver transplant in 2020.  CellCept was discontinued during most recent hospitalization, and tacrolimus dose was increased.  - Hepatology consulted, appreciate recs:  Prednisone 20 mg daily for 5 days (9/7-9/11)  Prednisone 15 mg daily for 5 days (9/12-9/16)  Prednisone 10 mg daily for 5 days - (9/17-9/21)  Prednisone 5 mg daily for 5 days (9/22-9/26)  Prednisone 5 mg every other day for 5 days (3 doses) (9/27-9/31)  - Continue tacrolimus 3 mg BID  - Daily BMP, HFP, Mg, Phos, INR     Hypertonic Hyponatremia  Iso hyperglycemia, corrected Na is wnl.   - trend BMP  - correct hyperglycemia (see above)        Chronic Problems  OSA: consider CPAP if needed  Hypothyroidism: continue levothyroxine 175 mcg daily  HTN:  continue metoprolol  Psych: continue sertraline  Normocytic Anemia: hgb improved from baseline, trend daily   CAD s/p CABG: continue aspirin, pravastatin      The patient's presentation is complicated by the following clinically significant conditions requiring additional evaluation and treatment: - Hyperkalemia POA requiring further investigation, treatment, or monitoring  - Acidosis POA requiring further investigation, treatment, or monitoring         Daily Checklist:  Diet: Regular Diet  DVT PPx: Lovenox 40mg  q24h  Electrolytes: No Repletion Needed  Code Status: Full Code  Dispo: Transfer to Floor    Team Contact Information:   Primary Team: Internal Medicine (MEDU)  Primary Resident: Ileene Musa, MD, MD  Resident's Pager: [phone] (Gen MedU Intern - Alvester Morin)    Interval History:   No acute events overnight.    ROS: Denies headache, chest pain, shortness of breath, abdominal pain, nausea, vomiting.  Objective:   Temp:  [36.3 ??C (97.3 ??F)-36.9 ??C (98.4 ??F)] 36.3 ??C (97.3 ??F)  Heart Rate:  [66-82] 79  SpO2 Pulse:  [73] 73  Resp:  [16-23] 16  BP: (112-160)/(69-91) 112/72  SpO2:  [92 %-97 %] 92 %,   Intake/Output Summary (Last 24 hours) at 11/01/2022 1147  Last data filed at 11/01/2022 0000  Gross per 24 hour   Intake 340 ml   Output --   Net 340 ml   , No intake/output data recorded.    Gen: NAD, converses   HENT: atraumatic, normocephalic  Heart: RRR  Lungs: Coughing intermittently. +Crackles RLL.   Abdomen: soft, NTND  Extremities: No edema

## 2022-11-01 NOTE — Unmapped (Signed)
Problem: Adult Inpatient Plan of Care  Goal: Plan of Care Review  Outcome: Progressing  Goal: Patient-Specific Goal (Individualized)  Outcome: Progressing  Goal: Absence of Hospital-Acquired Illness or Injury  Outcome: Progressing  Intervention: Identify and Manage Fall Risk  Recent Flowsheet Documentation  Taken 10/31/2022 2200 by Crista Elliot, RN  Safety Interventions:   environmental modification   fall reduction program maintained   nonskid shoes/slippers when out of bed   lighting adjusted for tasks/safety   low bed  Intervention: Prevent Skin Injury  Recent Flowsheet Documentation  Taken 11/01/2022 0400 by Crista Elliot, RN  Positioning for Skin: Right  Taken 11/01/2022 0200 by Crista Elliot, RN  Positioning for Skin: Right  Taken 11/01/2022 0000 by Crista Elliot, RN  Positioning for Skin: Left  Taken 10/31/2022 2200 by Crista Elliot, RN  Positioning for Skin: Supine/Back  Taken 10/31/2022 2117 by Crista Elliot, RN  Positioning for Skin: Right  Device Skin Pressure Protection:   adhesive use limited   absorbent pad utilized/changed  Skin Protection:   adhesive use limited   incontinence pads utilized  Goal: Optimal Comfort and Wellbeing  Outcome: Progressing  Goal: Readiness for Transition of Care  Outcome: Progressing  Goal: Rounds/Family Conference  Outcome: Progressing     Alert and oriented, VSS, RA, denies pain and independent with ADL. Glucose monitored and SSI given.  Rounds performed and  fall precautions maintained. No acute events overnight. Will continue to monitor

## 2022-11-01 NOTE — Unmapped (Signed)
Problem: Adult Inpatient Plan of Care  Goal: Plan of Care Review  11/01/2022 1656 by Vonzell Schlatter, Luiz Blare, RN  Outcome: Ongoing - Unchanged  11/01/2022 1655 by Vonzell Schlatter, Luiz Blare, RN  Outcome: Ongoing - Unchanged  Goal: Patient-Specific Goal (Individualized)  11/01/2022 1656 by Vonzell Schlatter, Luiz Blare, RN  Outcome: Ongoing - Unchanged  11/01/2022 1655 by Vonzell Schlatter, Luiz Blare, RN  Outcome: Ongoing - Unchanged  Goal: Absence of Hospital-Acquired Illness or Injury  11/01/2022 1656 by Vonzell Schlatter, Luiz Blare, RN  Outcome: Ongoing - Unchanged  11/01/2022 1655 by Vonzell Schlatter, Luiz Blare, RN  Outcome: Ongoing - Unchanged  Intervention: Identify and Manage Fall Risk  Recent Flowsheet Documentation  Taken 11/01/2022 1000 by Bertell Maria, RN  Safety Interventions:   low bed   lighting adjusted for tasks/safety  Taken 11/01/2022 0745 by Bertell Maria, RN  Safety Interventions:   low bed   lighting adjusted for tasks/safety  Intervention: Prevent Skin Injury  Recent Flowsheet Documentation  Taken 11/01/2022 0745 by Bertell Maria, RN  Positioning for Skin: Right  Device Skin Pressure Protection: absorbent pad utilized/changed  Skin Protection: adhesive use limited  Intervention: Prevent and Manage VTE (Venous Thromboembolism) Risk  Recent Flowsheet Documentation  Taken 11/01/2022 1200 by Bertell Maria, RN  Anti-Embolism Intervention: Other (Comment)  Taken 11/01/2022 1000 by Vonzell Schlatter, Luiz Blare, RN  Anti-Embolism Intervention: Other (Comment)  Taken 11/01/2022 0745 by Vonzell Schlatter, Luiz Blare, RN  Anti-Embolism Intervention: Other (Comment)  Goal: Optimal Comfort and Wellbeing  11/01/2022 1656 by Vonzell Schlatter, Luiz Blare, RN  Outcome: Ongoing - Unchanged  11/01/2022 1655 by Vonzell Schlatter, Luiz Blare, RN  Outcome: Ongoing - Unchanged  Goal: Readiness for Transition of Care  11/01/2022 1656 by Bertell Maria, RN  Outcome: Ongoing - Unchanged  11/01/2022 1655 by Vonzell Schlatter, Luiz Blare, RN  Outcome: Ongoing - Unchanged  Goal: Rounds/Family Conference  11/01/2022 1656 by Bertell Maria, RN  Outcome: Ongoing - Unchanged  11/01/2022 1655 by Vonzell Schlatter, Luiz Blare, RN  Outcome: Ongoing - Unchanged

## 2022-11-02 DIAGNOSIS — E612 Magnesium deficiency: Principal | ICD-10-CM

## 2022-11-02 DIAGNOSIS — Z944 Liver transplant status: Principal | ICD-10-CM

## 2022-11-02 DIAGNOSIS — Z5181 Encounter for therapeutic drug level monitoring: Principal | ICD-10-CM

## 2022-11-02 DIAGNOSIS — R739 Hyperglycemia, unspecified: Secondary | ICD-10-CM | POA: Diagnosis not present

## 2022-11-02 DIAGNOSIS — T380X5A Adverse effect of glucocorticoids and synthetic analogues, initial encounter: Secondary | ICD-10-CM | POA: Diagnosis not present

## 2022-11-02 LAB — HEPATIC FUNCTION PANEL
ALBUMIN: 2.7 g/dL — ABNORMAL LOW (ref 3.4–5.0)
ALKALINE PHOSPHATASE: 79 U/L (ref 46–116)
ALT (SGPT): 13 U/L (ref 10–49)
AST (SGOT): 9 U/L (ref <=34)
BILIRUBIN DIRECT: <0.1 mg/dL (ref 0.00–0.30)
BILIRUBIN TOTAL: 0.2 mg/dL — ABNORMAL LOW (ref 0.3–1.2)
PROTEIN TOTAL: 6.2 g/dL (ref 5.7–8.2)

## 2022-11-02 LAB — TACROLIMUS LEVEL: TACROLIMUS BLOOD: 3.5 ng/mL (ref 2.0–20.0)

## 2022-11-02 LAB — BASIC METABOLIC PANEL
ANION GAP: 7 mmol/L (ref 5–14)
BLOOD UREA NITROGEN: 25 mg/dL — ABNORMAL HIGH (ref 9–23)
BUN / CREAT RATIO: 28
CALCIUM: 9.1 mg/dL (ref 8.7–10.4)
CHLORIDE: 106 mmol/L (ref 98–107)
CO2: 24 mmol/L (ref 20.0–31.0)
CREATININE: 0.89 mg/dL
EGFR CKD-EPI (2021) MALE: >90 mL/min/{1.73_m2} (ref >=60)
GLUCOSE RANDOM: 126 mg/dL (ref 70–179)
POTASSIUM: 4.5 mmol/L (ref 3.4–4.8)
SODIUM: 137 mmol/L (ref 135–145)

## 2022-11-02 LAB — PHOSPHORUS: PHOSPHORUS: 4.3 mg/dL (ref 2.4–5.1)

## 2022-11-02 LAB — TACROLIMUS LEVEL, TROUGH
TACROLIMUS, TROUGH: 17.3 ng/mL — ABNORMAL HIGH (ref 5.0–15.0)
TACROLIMUS, TROUGH: 10.4 ng/mL (ref 5.0–15.0)

## 2022-11-02 LAB — MAGNESIUM: MAGNESIUM: 1.8 mg/dL (ref 1.6–2.6)

## 2022-11-02 MED ADMIN — insulin lispro (HumaLOG) injection 3 Units: 3 [IU] | SUBCUTANEOUS | @ 02:00:00 | Stop: 2022-11-01

## 2022-11-02 MED ADMIN — predniSONE (DELTASONE) tablet 20 mg: 20 mg | ORAL | @ 12:00:00

## 2022-11-02 MED ADMIN — insulin lispro (HumaLOG) injection 6 Units: 6 [IU] | SUBCUTANEOUS | @ 16:00:00

## 2022-11-02 MED ADMIN — insulin lispro (HumaLOG) injection 0-20 Units: 0-20 [IU] | SUBCUTANEOUS | @ 04:00:00

## 2022-11-02 MED ADMIN — tacrolimus (PROGRAF) capsule 3 mg: 3 mg | ORAL

## 2022-11-02 MED ADMIN — guaiFENesin (ROBITUSSIN) oral syrup: 200 mg | ORAL | @ 02:00:00

## 2022-11-02 MED ADMIN — dapsone tablet 100 mg: 100 mg | ORAL | @ 12:00:00

## 2022-11-02 MED ADMIN — insulin lispro (HumaLOG) injection 6 Units: 6 [IU] | SUBCUTANEOUS | @ 20:00:00

## 2022-11-02 MED ADMIN — insulin NPH (HumuLIN,NovoLIN) injection 10 Units: 10 [IU] | SUBCUTANEOUS | @ 20:00:00 | Stop: 2022-11-02

## 2022-11-02 MED ADMIN — pravastatin (PRAVACHOL) tablet 40 mg: 40 mg | ORAL | @ 21:00:00

## 2022-11-02 MED ADMIN — insulin lispro (HumaLOG) injection 0-20 Units: 0-20 [IU] | SUBCUTANEOUS | @ 20:00:00

## 2022-11-02 MED ADMIN — aspirin tablet 325 mg: 325 mg | ORAL | @ 12:00:00

## 2022-11-02 MED ADMIN — pioglitazone (ACTOS) tablet 15 mg: 15 mg | ORAL | @ 12:00:00

## 2022-11-02 MED ADMIN — lactated ringers bolus 1,000 mL: 1000 mL | INTRAVENOUS | @ 20:00:00 | Stop: 2022-11-02

## 2022-11-02 MED ADMIN — azithromycin (ZITHROMAX) tablet 500 mg: 500 mg | ORAL | @ 16:00:00 | Stop: 2022-11-02

## 2022-11-02 MED ADMIN — enoxaparin (LOVENOX) syringe 40 mg: 40 mg | SUBCUTANEOUS

## 2022-11-02 MED ADMIN — levothyroxine (SYNTHROID) tablet 175 mcg: 175 ug | ORAL | @ 12:00:00

## 2022-11-02 MED ADMIN — insulin NPH (HumuLIN,NovoLIN) injection 10 Units: 10 [IU] | SUBCUTANEOUS | @ 12:00:00 | Stop: 2022-11-02

## 2022-11-02 MED ADMIN — metoPROLOL tartrate (Lopressor) tablet 25 mg: 25 mg | ORAL

## 2022-11-02 MED ADMIN — metoPROLOL tartrate (Lopressor) tablet 25 mg: 25 mg | ORAL | @ 12:00:00

## 2022-11-02 MED ADMIN — tacrolimus (PROGRAF) capsule 3 mg: 3 mg | ORAL | @ 12:00:00

## 2022-11-02 MED ADMIN — cefTRIAXone (ROCEPHIN) 1 g in sodium chloride 0.9 % (NS) 100 mL IVPB-MBP: 1 g | INTRAVENOUS | Stop: 2022-11-04

## 2022-11-02 MED ADMIN — insulin lispro (HumaLOG) injection 0-20 Units: 0-20 [IU] | SUBCUTANEOUS | @ 02:00:00 | Stop: 2022-11-01

## 2022-11-02 MED ADMIN — sertraline (ZOLOFT) tablet 50 mg: 50 mg | ORAL | @ 12:00:00

## 2022-11-02 MED ADMIN — insulin lispro (HumaLOG) injection 0-20 Units: 0-20 [IU] | SUBCUTANEOUS | @ 16:00:00

## 2022-11-02 NOTE — Unmapped (Signed)
Problem: Adult Inpatient Plan of Care  Goal: Plan of Care Review  11/02/2022 1625 by Tresa Res, RN  Outcome: Progressing  11/02/2022 1307 by Tresa Res, RN  Outcome: Progressing  Goal: Patient-Specific Goal (Individualized)  11/02/2022 1625 by Tresa Res, RN  Outcome: Progressing  11/02/2022 1307 by Tresa Res, RN  Outcome: Progressing  Goal: Absence of Hospital-Acquired Illness or Injury  11/02/2022 1625 by Tresa Res, RN  Outcome: Progressing  11/02/2022 1307 by Tresa Res, RN  Outcome: Progressing  Goal: Optimal Comfort and Wellbeing  11/02/2022 1625 by Tresa Res, RN  Outcome: Progressing  11/02/2022 1307 by Tresa Res, RN  Outcome: Progressing  Goal: Readiness for Transition of Care  11/02/2022 1625 by Tresa Res, RN  Outcome: Progressing  11/02/2022 1307 by Tresa Res, RN  Outcome: Progressing  Goal: Rounds/Family Conference  11/02/2022 1625 by Tresa Res, RN  Outcome: Progressing  11/02/2022 1307 by Tresa Res, RN  Outcome: Progressing

## 2022-11-02 NOTE — Unmapped (Signed)
Problem: Adult Inpatient Plan of Care  Goal: Plan of Care Review  Outcome: Progressing  Goal: Patient-Specific Goal (Individualized)  Outcome: Progressing  Goal: Absence of Hospital-Acquired Illness or Injury  Outcome: Progressing  Intervention: Identify and Manage Fall Risk  Recent Flowsheet Documentation  Taken 11/01/2022 2200 by Crista Elliot, RN  Safety Interventions:   environmental modification   fall reduction program maintained   nonskid shoes/slippers when out of bed   lighting adjusted for tasks/safety   low bed  Intervention: Prevent Skin Injury  Recent Flowsheet Documentation  Taken 11/02/2022 0400 by Crista Elliot, RN  Positioning for Skin: Left  Taken 11/02/2022 0200 by Crista Elliot, RN  Positioning for Skin: Supine/Back  Taken 11/02/2022 0000 by Crista Elliot, RN  Positioning for Skin: Right  Taken 11/01/2022 2200 by Crista Elliot, RN  Positioning for Skin: Right  Taken 11/01/2022 2018 by Crista Elliot, RN  Positioning for Skin: Right  Device Skin Pressure Protection:   adhesive use limited   absorbent pad utilized/changed  Skin Protection:   adhesive use limited   incontinence pads utilized  Goal: Optimal Comfort and Wellbeing  Outcome: Progressing  Goal: Readiness for Transition of Care  Outcome: Progressing  Goal: Rounds/Family Conference  Outcome: Progressing

## 2022-11-02 NOTE — Unmapped (Signed)
Internal Medicine (MEDU) Progress Note    Assessment & Plan:   Anthony Alvarado is a 62 y.o. male whose presentation is complicated by cryptogenic cirrhosis s/p liver transplant (08/2018), T2DM, OSA, AS s/p AVR, CAD s/p CABG, hypothyroidism, and recent hospitalization for colitis  that presented to [facility] with hyperglycemia ISO steroid use for colitis, CAP.     Principal Problem:    Steroid-induced hyperglycemia  Active Problems:    Liver transplant recipient (CMS-HCC)    Diabetes mellitus without complication (CMS-HCC)    Hypothyroidism    OSA (obstructive sleep apnea)    Essential hypertension    Pure hypercholesterolemia    Colitis    Leukocytosis    Hyponatremia with increased serum osmolality        Active Problems    Steroid-Induced Hyperglycemia - T2DM  Current diabetes regimen is Jardiance 25 mg daily and pioglitazone 30 mg daily. Glucose as high as the 400s overnight requiring approximately 20U of SSI. In light of this will plan to go ahead and add 10U of NPH daily in the morning to assist with glucose control per outpatient endocrinologist's initial thoughts. Discussed with patient that as steroid he when he comes off of his steroid taper he should stop taking NPH. Patient has had persistently high sugars to 400's even with NPH. Will add meal-time insulin, no need for further basal.  - Cont Jardiance  - Cont Pioglitazone  -Increase insulin regimen to 20u NPH in the morning, 6 units Lispro with meals, SSI  - BMP daily  - q4h SSI, POCT glucose checks    Community Acquired Pneumonia    CXR with RLL opacity c/f atelectasis vs developing pneumonia. Patient started on CAP treatment with CTX and azithromycin as a result. Other than cough patient has remained relatively asymptomatic without fever or SOB.   - Continue Ceftriaxone (EOT 9/11), azithromycin (EOT 9/9)  - daily CBC     Cryptogenic Cirrhosis s/p Liver Transplant (08/2018) - MMF-induced colitis (improved)  Patient received liver transplant in 2020. CellCept was discontinued during most recent hospitalization, and tacrolimus dose was increased.  - Hepatology consulted, appreciate recs:  Prednisone 20 mg daily for 5 days (9/7-9/11)  Prednisone 15 mg daily for 5 days (9/12-9/16)  Prednisone 10 mg daily for 5 days - (9/17-9/21)  Prednisone 5 mg daily for 5 days (9/22-9/26)  Prednisone 5 mg every other day for 5 days (3 doses) (9/27-9/31)  - Continue tacrolimus 3 mg BID  - Daily BMP, HFP, Mg, Phos, INR     Hypertonic Hyponatremia  Iso hyperglycemia, corrected Na is wnl.   - trend BMP  - correct hyperglycemia (see above)        Chronic Problems  OSA: consider CPAP if needed  Hypothyroidism: continue levothyroxine 175 mcg daily  HTN:  continue metoprolol  Psych: continue sertraline  Normocytic Anemia: hgb improved from baseline, trend daily   CAD s/p CABG: continue aspirin, pravastatin      The patient's presentation is complicated by the following clinically significant conditions requiring additional evaluation and treatment: - Hyperkalemia POA requiring further investigation, treatment, or monitoring  - Acidosis POA requiring further investigation, treatment, or monitoring         Daily Checklist:  Diet: Regular Diet  DVT PPx: Lovenox 40mg  q24h  Electrolytes: No Repletion Needed  Code Status: Full Code  Dispo: Transfer to Floor    Team Contact Information:   Primary Team: Internal Medicine (MEDU)  Primary Resident: Luvenia Heller, MD, MD  Resident's Pager:  [phone] (Gen MedU Intern - Alvester Morin)    Interval History:   No acute events overnight.    ROS: Denies headache, chest pain, shortness of breath, abdominal pain, nausea, vomiting.    Objective:   Temp:  [36.3 ??C (97.3 ??F)-36.8 ??C (98.2 ??F)] 36.4 ??C (97.5 ??F)  Heart Rate:  [65-80] 74  Resp:  [17-18] 18  BP: (115-165)/(72-99) 117/82  SpO2:  [91 %-95 %] 93 %,   Intake/Output Summary (Last 24 hours) at 11/02/2022 1409  Last data filed at 11/02/2022 0400  Gross per 24 hour   Intake 240 ml   Output --   Net 240 ml   , No intake/output data recorded.    Gen: NAD, converses   HENT: atraumatic, normocephalic  Heart: RRR  Lungs: Coughing intermittently. +Crackles RLL.   Abdomen: soft, NTND  Extremities: No edema

## 2022-11-02 NOTE — Unmapped (Signed)
Problem: Adult Inpatient Plan of Care  Goal: Plan of Care Review  Outcome: Progressing  Goal: Patient-Specific Goal (Individualized)  Outcome: Progressing  Goal: Absence of Hospital-Acquired Illness or Injury  Outcome: Progressing  Goal: Optimal Comfort and Wellbeing  Outcome: Progressing  Goal: Readiness for Transition of Care  Outcome: Progressing  Goal: Rounds/Family Conference  Outcome: Progressing

## 2022-11-02 NOTE — Unmapped (Addendum)
Tacrolimus Therapeutic Monitoring Pharmacy Note  Anthony Alvarado is a 62 y.o. male continuing tacrolimus.     Indication: Liver transplant     Date of Transplant:  08/2018       Prior Dosing Information: Home regimen 3 mg BID      See Below for Current Inpatient Dosing and Levels    Source(s) of information used to determine prior to admission dosing: Home Medication List, Clinic Note, or Fill HIstory    Goals:  Therapeutic Drug Levels  Tacrolimus trough goal:  4-6 ng/mL  per 8/9 consult note    Additional Clinical Monitoring/Outcomes  Monitor renal function (SCr and urine output) and liver function (LFTs)  Monitor for signs/symptoms of adverse events (e.g., hyperglycemia, hyperkalemia, hypomagnesemia, hypertension, headache, tremor)    Results:     Creatinine   Date Value Ref Range Status   11/02/2022 0.89 0.73 - 1.18 mg/dL Final   16/11/9602 5.40 0.73 - 1.18 mg/dL Final   98/12/9145 8.29 0.73 - 1.18 mg/dL Final      Tacrolimus Dosing  - 9/6-current 3mg  BID    Tacrolimus Troughs  -9/6 3.5  -9/8 17.3 (drawn after morning dose given)  -9/9 10.4    Pharmacokinetic Considerations and Significant Drug Interactions:  Concurrent hepatotoxic medications:  pravastatin (hm), dapsone, pioglitazone    Concurrent CYP3A4 substrates/inhibitors: azithromycin  Concurrent nephrotoxic medications:  Bactrim, Jardiance (hm)      Assessment/Plan unless otherwise indicated by Transplant Team  9/9 tacro dose drawn ~ 10h from last dose, true trough would be slightly lower but maybe not all the way to goal range  Prior checks have been closer to 3ng/mL  NOTE - azithromycin started during this admission  - Last Dose 9/9,,, (this can increase tacrolimus levels)    Continue current regimen of 3 mg BID  Discussed with transplant and MedU teams, agreed to wait out azithromycin knowing there will be slightly higher than goal levels     Follow-up  Tacrolimus Level:  DAILY between 0600-0800 PRIOR to morning dose.  Dose due 0800  A pharmacist will continue to monitor and recommend levels as appropriate    Delrae Rend, PharmD    Marylene Buerger, PharmD  Clinical Specialist - Internal Medicine  MD[facility], [facility] APP Team Pharmacist  November 02, 2022 1:23 PM

## 2022-11-02 NOTE — Unmapped (Signed)
Diabetes education consultation: Consulted for assistance with providing instruction on diabetes self-management skills. Contacted Anthony Alvarado via phone at the bedside to discuss diabetes education needs. Sent resource folder to review with Anthony Alvarado will in house. Spent 15 minutes provide education and talking with Anthony Alvarado via phone.     Reason for Consult: Blood Glucose Monitoring, Insulin Instruction, Insulin pen teaching  EDD: Today  Reviewed consult orders with: Anthony Alvarado expressed he is not leaving today, probably tomorrow.       Assessment: Anthony Alvarado admitted with cryptogenic cirrhosis s/p liver transplant (08/2018), T2DM, OSA, AS s/p AVR, CAD s/p CABG, hypothyroidism, and recent hospitalization for colitis that presented to [facility] with hyperglycemia ISO steroid use for colitis, CAP. See MAR for medication list.     A1C:    Lab Results   Component Value Date    A1C 7.4 (H) 09/15/2022    EAG 166 09/15/2022       Insulin administration & safety:Jashan Zella Richer expressed he has uses an insulin pen in the past but not recently. Set up, Insulin Pen, instructional video via [employer] for patient to watch. In the video Anthony Alvarado will learn insulin safety - including injection site selection & rotation, insulin pen storage, sharps disposal & discard/expiration date.  Requested that clinical nursing staff have Anthony Alvarado inject insulin doses, so he can get practice with self-injection.  Also asked that they review correctional scale as well.       Insurance: Anthony Alvarado has [employer] Adv HMO/PPO. There are not finanical barriers to glucose monitoring.     Monitoring: Anthony Alvarado expressed he does have a glucose meter to check blood sugars at home. He confirmed it is One Touch. He inquired about CGM. He expressed he does have a smart phone. Instructed Anthony Alvarado to download the [employer]. Shared will bring a CGM and provide instruction.     Glucose Monitoring Supplies needing prescriptions at discharge:  Freestyle Libre3 14 day Flash Glucose Sensor kit x 2 (month supply) with 11 refills   One Touch Verio Strips  One Touch Delica Lancets      Recommendations: At f/u visit with PCP request referral for DSMES (Diabetes Self Management Education and Support) after discharge.     Plan: Plan to follow patient and provide additional diabetes education while in house.   Thank You,   Remonia Richter, BSN, RN, [employer], Certified Diabetes Care & Education Specialist  - pager: [phone]

## 2022-11-03 ENCOUNTER — Ambulatory Visit: Payer: Medicare Other | Admitting: Family Medicine

## 2022-11-03 DIAGNOSIS — T380X5A Adverse effect of glucocorticoids and synthetic analogues, initial encounter: Secondary | ICD-10-CM | POA: Diagnosis not present

## 2022-11-03 DIAGNOSIS — R739 Hyperglycemia, unspecified: Secondary | ICD-10-CM | POA: Diagnosis not present

## 2022-11-03 LAB — BASIC METABOLIC PANEL
ANION GAP: 8 mmol/L (ref 5–14)
BLOOD UREA NITROGEN: 18 mg/dL (ref 9–23)
BUN / CREAT RATIO: 23
CALCIUM: 8.9 mg/dL (ref 8.7–10.4)
CHLORIDE: 107 mmol/L (ref 98–107)
CO2: 25 mmol/L (ref 20.0–31.0)
CREATININE: 0.78 mg/dL
EGFR CKD-EPI (2021) MALE: >90 mL/min/{1.73_m2} (ref >=60)
GLUCOSE RANDOM: 134 mg/dL (ref 70–179)
POTASSIUM: 3.7 mmol/L (ref 3.4–4.8)
SODIUM: 140 mmol/L (ref 135–145)

## 2022-11-03 LAB — MAGNESIUM: MAGNESIUM: 1.6 mg/dL (ref 1.6–2.6)

## 2022-11-03 LAB — PHOSPHORUS: PHOSPHORUS: 3.8 mg/dL (ref 2.4–5.1)

## 2022-11-03 LAB — TACROLIMUS LEVEL, TIMED: TACROLIMUS BLOOD: 6.7 ng/mL

## 2022-11-03 MED ORDER — INSULIN LISPRO (U-100) 100 UNIT/ML SUBCUTANEOUS PEN
Freq: Three times a day (TID) | SUBCUTANEOUS | 0 refills | 50 days
Start: 2022-11-03 — End: 2022-12-03

## 2022-11-03 MED ORDER — INSULIN NPH ISOPHANE U-100 HUMAN 100 UNIT/ML (3 ML) SUBCUTANEOUS PEN
Freq: Every morning | SUBCUTANEOUS | 0 refills | 75 days
Start: 2022-11-03 — End: 2022-12-03

## 2022-11-03 MED ADMIN — insulin lispro (HumaLOG) injection 0-20 Units: 0-20 [IU] | SUBCUTANEOUS | @ 22:00:00

## 2022-11-03 MED ADMIN — tacrolimus (PROGRAF) capsule 3 mg: 3 mg | ORAL | @ 01:00:00

## 2022-11-03 MED ADMIN — metoPROLOL tartrate (Lopressor) tablet 25 mg: 25 mg | ORAL | @ 01:00:00

## 2022-11-03 MED ADMIN — tacrolimus (PROGRAF) capsule 3 mg: 3 mg | ORAL | @ 14:00:00

## 2022-11-03 MED ADMIN — aspirin tablet 325 mg: 325 mg | ORAL | @ 14:00:00

## 2022-11-03 MED ADMIN — metoPROLOL tartrate (Lopressor) tablet 25 mg: 25 mg | ORAL | @ 14:00:00

## 2022-11-03 MED ADMIN — insulin lispro (HumaLOG) injection 0-20 Units: 0-20 [IU] | SUBCUTANEOUS | @ 01:00:00

## 2022-11-03 MED ADMIN — levothyroxine (SYNTHROID) tablet 175 mcg: 175 ug | ORAL | @ 14:00:00

## 2022-11-03 MED ADMIN — insulin lispro (HumaLOG) injection 6 Units: 6 [IU] | SUBCUTANEOUS | @ 14:00:00 | Stop: 2022-11-03

## 2022-11-03 MED ADMIN — insulin lispro (HumaLOG) injection 10 Units: 10 [IU] | SUBCUTANEOUS | @ 17:00:00

## 2022-11-03 MED ADMIN — insulin lispro (HumaLOG) injection 10 Units: 10 [IU] | SUBCUTANEOUS | @ 22:00:00

## 2022-11-03 MED ADMIN — sertraline (ZOLOFT) tablet 50 mg: 50 mg | ORAL | @ 14:00:00

## 2022-11-03 MED ADMIN — insulin lispro (HumaLOG) injection 0-20 Units: 0-20 [IU] | SUBCUTANEOUS | @ 17:00:00

## 2022-11-03 MED ADMIN — cefTRIAXone (ROCEPHIN) 1 g in sodium chloride 0.9 % (NS) 100 mL IVPB-MBP: 1 g | INTRAVENOUS | @ 01:00:00 | Stop: 2022-11-04

## 2022-11-03 MED ADMIN — predniSONE (DELTASONE) tablet 20 mg: 20 mg | ORAL | @ 14:00:00

## 2022-11-03 MED ADMIN — enoxaparin (LOVENOX) syringe 40 mg: 40 mg | SUBCUTANEOUS | @ 01:00:00

## 2022-11-03 MED ADMIN — insulin NPH (HumuLIN,NovoLIN) injection 20 Units: 20 [IU] | SUBCUTANEOUS | @ 14:00:00

## 2022-11-03 MED ADMIN — dapsone tablet 100 mg: 100 mg | ORAL | @ 14:00:00

## 2022-11-03 MED ADMIN — pioglitazone (ACTOS) tablet 15 mg: 15 mg | ORAL | @ 14:00:00

## 2022-11-03 MED ADMIN — pravastatin (PRAVACHOL) tablet 40 mg: 40 mg | ORAL | @ 22:00:00

## 2022-11-03 NOTE — Unmapped (Signed)
Tacrolimus Therapeutic Monitoring Pharmacy Note  Anthony Alvarado is a 62 y.o. male continuing tacrolimus.     Indication: Liver transplant     Date of Transplant:  08/2018       Prior Dosing Information: Home regimen 3 mg BID      See Below for Current Inpatient Dosing and Levels    Source(s) of information used to determine prior to admission dosing: Home Medication List, Clinic Note, or Fill HIstory    Goals:  Therapeutic Drug Levels  Tacrolimus trough goal:  4-6 ng/mL  per 8/9 consult note    Additional Clinical Monitoring/Outcomes  Monitor renal function (SCr and urine output) and liver function (LFTs)  Monitor for signs/symptoms of adverse events (e.g., hyperglycemia, hyperkalemia, hypomagnesemia, hypertension, headache, tremor)    Results:     Creatinine   Date Value Ref Range Status   11/03/2022 0.78 0.73 - 1.18 mg/dL Final   02/25/7251 6.64 0.73 - 1.18 mg/dL Final   40/34/7425 9.56 0.73 - 1.18 mg/dL Final      Tacrolimus Dosing  - 9/6-current 3mg  BID    Tacrolimus Troughs  -9/6 3.5  -9/8 17.3 (drawn after morning dose given)  -9/9 10.4  -9/10 6.7    Pharmacokinetic Considerations and Significant Drug Interactions:  Concurrent hepatotoxic medications:  pravastatin (hm), dapsone, pioglitazone    Concurrent CYP3A4 substrates/inhibitors: azithromycin  Concurrent nephrotoxic medications:  Bactrim, Jardiance (hm)      Assessment/Plan unless otherwise indicated by Transplant Team  9/10 tacro dose drawn ~ 9.5h after prior dose  Prior checks have been closer to 3ng/mL  NOTE - azithromycin started during this admission  - Last Dose 9/9 (this can increase tacrolimus levels)    Continue current regimen of 3 mg BID  Discussed with transplant and MedU teams, agreed to wait out azithromycin knowing there will be slightly higher than goal levels     Follow-up  Tacrolimus Level:  DAILY between 0600-0800 PRIOR to morning dose.  Dose due 0800  A pharmacist will continue to monitor and recommend levels as appropriate    Delrae Rend, PharmD

## 2022-11-03 NOTE — Unmapped (Signed)
Diabetes education consultation: Follow up consultation on previous education.  Visit with  Anthony Alvarado at the bedside. Provided 75 minutes diabetes education.    Insulin administration & safety: Discussed current regimen, explaining long-acting vs rapid acting action, timing of administration & described correctional sliding scale & connection to glucose monitoring. Shared with Anthony Alvarado clinical nurse will review final diabetes discharge plan. Instructed on insulin administration with pens and insulin safety - including injection site selection & rotation, insulin pen storage, sharps disposal & discard/expiration date.  Anthony Alvarado returned Geophysical data processor & did well, stating Anthony Alvarado felt confident with ability.  Requested that clinical nursing staff have Anthony Alvarado inject insulin doses, so Anthony Alvarado can get practice with self-injection.  Also asked that they review correctional scale as well.  Reviewed current correctional scale listed in [facility] with.Anthony Alvarado and he was able to identify the correct amount of insulin at meals using mealtime and correction scale.   Provided copy of Insulin Pen: How to Use, as a resource for discharge.    Problem-solving: Hypoglycemia:    Instructed on hypoglycemia recognition & treatment, including 15-15 rule, listing several examples of appropriate fast carb sources, emphasizing importance of having something readily available.  Described possible causes and prevention.  Instructed to contact provider for unexplained episode, an episode requiring more than 1 treatment, or 2 episodes within a 2-3 day period, for possible dose adjustment.    Hyperglycemia:  Instructed on hyperglycemia recognition and treatment. Described possible causes and prevention and when to contact provider.     Risk Reduction- Foot Care:  Discussed principles of good foot care such as daily cleansing, and inspection, avoiding walking barefoot, having well fitting shoes and no bathroom surgery on feet.  Provided education material Diabetes Foot Health: Care Instructions as a resource.      Healthy Eating:  Discussed with patient meal planning principles for glucose control including the plate method, which foods are carbs and portion control. Reviewed avoiding sugary beverages/concentrated sweet. Reviewed dietary guidelines/ plate method/ avoiding concentrated sweets/eating at consistent times.   Meal Planning Basics  Foods that Affect and Do Not Affect Blood Glucose  Timing, Amount, and Balance in Meals  Food Labels  Carbohydrate Counting   Low Carbohydrate Snacks    Diabetes Resources: Provided, Plan Your Portion Plate, from the ADA, A1C, Diabetes: Sick Care, Changing Life with Diabetes, Low Blood Sugars/High Blood Sugars and Certified Diabetes Educator Referral, as resources for discharge.      Monitoring: Provided Anthony Alvarado with FreeStyle Libre 3 sensor kit and instruction on use.  Anthony Alvarado watched instructional Freestyle libre 3 youtube video with diabetes educator. Provided FreeStyle Libre 3 sensor application instruction. He downloaded [employer] 3 app on his phone. Shared while patient in house, the clinical staff will continue to use the hospital glucose meter to check blood sugars. Provided with copies of large print log sheets to maintain a record of readings & instructed to bring to follow-up visits with provider for evaluation of treatment plan.    Glucose Monitoring Supplies needing prescriptions at discharge:  Freestyle Libre3 14 day Flash Glucose Sensor kit x 2 (month supply) with 11 refills   One Touch Verio Strips  One Touch Delica Lancet    Recommendations: At f/u visit with PCP request referral for DSMES (Diabetes Self Management Education and Support) after discharge.     Clinical Nurse Teach/Review: Allow patient to give insulin injections, review  the types of insulin given, rotation of sites and review hypoglycemia s/s with patient.       Plan: Reconsult if necessary.   Thank You,   Remonia Richter, BSN, RN, [employer], Certified Diabetes Care & Education Specialist  - pager: [phone]

## 2022-11-03 NOTE — Unmapped (Signed)
Pt has remained free from falls this shift. Blood sugar was initially elevated earlier in the shift but remained WNL most of the later part of the shift. He has denied pain or discomfort. Remains independent with his ADLs. Able to make his needs known.     Problem: Adult Inpatient Plan of Care  Goal: Plan of Care Review  Outcome: Progressing  Goal: Patient-Specific Goal (Individualized)  Outcome: Progressing  Goal: Absence of Hospital-Acquired Illness or Injury  Outcome: Progressing  Intervention: Identify and Manage Fall Risk  Recent Flowsheet Documentation  Taken 11/03/2022 0600 by Modena Morrow, RN  Safety Interventions: low bed  Taken 11/03/2022 0400 by Modena Morrow, RN  Safety Interventions: low bed  Taken 11/03/2022 0200 by Modena Morrow, RN  Safety Interventions: low bed  Taken 11/03/2022 0000 by Modena Morrow, RN  Safety Interventions: low bed  Taken 11/02/2022 2200 by Modena Morrow, RN  Safety Interventions: low bed  Taken 11/02/2022 2000 by Modena Morrow, RN  Safety Interventions: low bed  Goal: Optimal Comfort and Wellbeing  Outcome: Progressing  Goal: Readiness for Transition of Care  Outcome: Progressing  Goal: Rounds/Family Conference  Outcome: Progressing

## 2022-11-03 NOTE — Unmapped (Signed)
Problem: Adult Inpatient Plan of Care  Goal: Plan of Care Review  Outcome: Progressing  Goal: Patient-Specific Goal (Individualized)  Outcome: Progressing  Goal: Absence of Hospital-Acquired Illness or Injury  Outcome: Progressing  Goal: Optimal Comfort and Wellbeing  Outcome: Progressing  Goal: Readiness for Transition of Care  Outcome: Progressing  Goal: Rounds/Family Conference  Outcome: Progressing

## 2022-11-03 NOTE — Unmapped (Signed)
Internal Medicine (MEDU) Progress Note    Assessment & Plan:   Anthony Alvarado is a 62 y.o. male whose presentation is complicated by cryptogenic cirrhosis s/p liver transplant (08/2018), T2DM, OSA, AS s/p AVR, CAD s/p CABG, hypothyroidism, and recent hospitalization for colitis  that presented to [facility] with hyperglycemia ISO steroid use for colitis, CAP.     Principal Problem:    Steroid-induced hyperglycemia  Active Problems:    Liver transplant recipient (CMS-HCC)    Diabetes mellitus without complication (CMS-HCC)    Hypothyroidism    OSA (obstructive sleep apnea)    Essential hypertension    Pure hypercholesterolemia    Colitis    Leukocytosis    Hyponatremia with increased serum osmolality        Active Problems    Steroid-Induced Hyperglycemia - T2DM  Current diabetes regimen is Jardiance 25 mg daily and pioglitazone 30 mg daily. Glucose as high as the 400s overnight requiring approximately 20U of SSI. In light of this will plan to go ahead and add 10U of NPH daily in the morning to assist with glucose control per outpatient endocrinologist's initial thoughts. Discussed with patient that as steroid he when he comes off of his steroid taper he should stop taking NPH. Patient has had persistently high sugars to 400's even with NPH and meal-time insulin, uptitrating prior to discharge and he will need close follow up with endocrine.   - Cont Jardiance  - Cont Pioglitazone  -Increase insulin regimen to 20u NPH in the morning, 10 units Lispro with meals, SSI  - BMP daily  - q4h SSI, POCT glucose checks    Community Acquired Pneumonia    CXR with RLL opacity c/f atelectasis vs developing pneumonia. Patient started on CAP treatment with CTX and azithromycin as a result. Other than cough patient has remained relatively asymptomatic without fever or SOB.   - Continue Ceftriaxone (EOT 9/11), completed 3 days Azithromycin  - daily CBC     Cryptogenic Cirrhosis s/p Liver Transplant (08/2018) - MMF-induced colitis (improved)  Patient received liver transplant in 2020.  CellCept was discontinued during most recent hospitalization, and tacrolimus dose was increased.  - Hepatology consulted, appreciate recs:  Prednisone 20 mg daily for 5 days (9/7-9/11)  Prednisone 15 mg daily for 5 days (9/12-9/16)  Prednisone 10 mg daily for 5 days - (9/17-9/21)  Prednisone 5 mg daily for 5 days (9/22-9/26)  Prednisone 5 mg every other day for 5 days (3 doses) (9/27-9/31)  - Continue tacrolimus 3 mg BID  - Daily BMP, HFP, Mg, Phos, INR     Hypertonic Hyponatremia  Iso hyperglycemia, corrected Na is wnl.   - trend BMP  - correct hyperglycemia (see above)        Chronic Problems  OSA: consider CPAP if needed  Hypothyroidism: continue levothyroxine 175 mcg daily  HTN:  continue metoprolol  Psych: continue sertraline  Normocytic Anemia: hgb improved from baseline, trend daily   CAD s/p CABG: continue aspirin, pravastatin      The patient's presentation is complicated by the following clinically significant conditions requiring additional evaluation and treatment: - Hyperkalemia POA requiring further investigation, treatment, or monitoring  - Acidosis POA requiring further investigation, treatment, or monitoring         Daily Checklist:  Diet: Regular Diet  DVT PPx: Lovenox 40mg  q24h  Electrolytes: No Repletion Needed  Code Status: Full Code  Dispo: Transfer to Floor    Team Contact Information:   Primary Team: Internal Medicine (MEDU)  Primary Resident: Luvenia Heller, MD, MD  Resident's Pager: [phone] (Gen MedU Intern - Alvester Morin)    Interval History:   No acute events overnight.    ROS: Denies headache, chest pain, shortness of breath, abdominal pain, nausea, vomiting.    Objective:   Temp:  [36.4 ??C (97.5 ??F)-36.8 ??C (98.2 ??F)] 36.7 ??C (98.1 ??F)  Heart Rate:  [65-77] 72  Resp:  [16-19] 16  BP: (139-184)/(87-96) 152/87  SpO2:  [93 %-95 %] 93 %, No intake or output data in the 24 hours ending 11/03/22 1536  , No intake/output data recorded.    Gen: NAD, converses   HENT: atraumatic, normocephalic  Heart: RRR  Lungs: Coughing intermittently.  Abdomen: soft, NTND  Extremities: No edema    Luvenia Heller, MD  Internal Medicine, PGY-1

## 2022-11-04 ENCOUNTER — Encounter: Payer: Self-pay | Admitting: Family Medicine

## 2022-11-04 DIAGNOSIS — E43 Unspecified severe protein-calorie malnutrition: Secondary | ICD-10-CM | POA: Diagnosis not present

## 2022-11-04 DIAGNOSIS — J189 Pneumonia, unspecified organism: Secondary | ICD-10-CM | POA: Diagnosis not present

## 2022-11-04 DIAGNOSIS — E1165 Type 2 diabetes mellitus with hyperglycemia: Secondary | ICD-10-CM | POA: Diagnosis not present

## 2022-11-04 DIAGNOSIS — E871 Hypo-osmolality and hyponatremia: Secondary | ICD-10-CM | POA: Diagnosis not present

## 2022-11-04 LAB — MAGNESIUM: MAGNESIUM: 1.4 mg/dL — ABNORMAL LOW (ref 1.6–2.6)

## 2022-11-04 LAB — BASIC METABOLIC PANEL
ANION GAP: 3 mmol/L — ABNORMAL LOW (ref 5–14)
BLOOD UREA NITROGEN: 15 mg/dL (ref 9–23)
BUN / CREAT RATIO: 19
CALCIUM: 9 mg/dL (ref 8.7–10.4)
CHLORIDE: 107 mmol/L (ref 98–107)
CO2: 29 mmol/L (ref 20.0–31.0)
CREATININE: 0.8 mg/dL
EGFR CKD-EPI (2021) MALE: >90 mL/min/{1.73_m2} (ref >=60)
GLUCOSE RANDOM: 154 mg/dL (ref 70–179)
POTASSIUM: 4 mmol/L (ref 3.4–4.8)
SODIUM: 139 mmol/L (ref 135–145)

## 2022-11-04 LAB — TACROLIMUS LEVEL, TROUGH: TACROLIMUS, TROUGH: 5.7 ng/mL (ref 5.0–15.0)

## 2022-11-04 LAB — PHOSPHORUS: PHOSPHORUS: 3.6 mg/dL (ref 2.4–5.1)

## 2022-11-04 MED ORDER — INSULIN LISPRO (U-100) 100 UNIT/ML SUBCUTANEOUS PEN
Freq: Three times a day (TID) | SUBCUTANEOUS | 0 refills | 50 days | Status: CP
Start: 2022-11-04 — End: ?
  Filled 2022-11-04: qty 15, 75d supply, fill #0

## 2022-11-04 MED ORDER — PREDNISONE 5 MG TABLET
ORAL_TABLET | ORAL | 0 refills | 22 days | Status: CP
Start: 2022-11-04 — End: 2022-11-28
  Filled 2022-11-04: qty 49, 24d supply, fill #0

## 2022-11-04 MED ORDER — GLUCOSE 4 GRAM CHEWABLE TABLET
ORAL_TABLET | ORAL | 12 refills | 1 days | Status: CP | PRN
Start: 2022-11-04 — End: 2023-11-04
  Filled 2022-11-04: qty 50, 1d supply, fill #0

## 2022-11-04 MED ORDER — PEN NEEDLE, DIABETIC 32 GAUGE X 5/32" (4 MM)
1 refills | 0 days | Status: CP
Start: 2022-11-04 — End: ?

## 2022-11-04 MED ORDER — INSULIN NPH ISOPHANE U-100 HUMAN 100 UNIT/ML (3 ML) SUBCUTANEOUS PEN
Freq: Every day | SUBCUTANEOUS | 0 refills | 75 days | Status: CP
Start: 2022-11-04 — End: 2023-01-18

## 2022-11-04 MED ADMIN — tacrolimus (PROGRAF) capsule 3 mg: 3 mg | ORAL | @ 02:00:00

## 2022-11-04 MED ADMIN — pioglitazone (ACTOS) tablet 15 mg: 15 mg | ORAL | @ 13:00:00 | Stop: 2022-11-04

## 2022-11-04 MED ADMIN — cefTRIAXone (ROCEPHIN) 1 g in sodium chloride 0.9 % (NS) 100 mL IVPB-MBP: 1 g | INTRAVENOUS | @ 01:00:00 | Stop: 2022-11-03

## 2022-11-04 MED ADMIN — levothyroxine (SYNTHROID) tablet 175 mcg: 175 ug | ORAL | @ 13:00:00 | Stop: 2022-11-04

## 2022-11-04 MED ADMIN — aspirin tablet 325 mg: 325 mg | ORAL | @ 13:00:00 | Stop: 2022-11-04

## 2022-11-04 MED ADMIN — dapsone tablet 100 mg: 100 mg | ORAL | @ 13:00:00 | Stop: 2022-11-04

## 2022-11-04 MED ADMIN — enoxaparin (LOVENOX) syringe 40 mg: 40 mg | SUBCUTANEOUS | @ 01:00:00

## 2022-11-04 MED ADMIN — metoPROLOL tartrate (Lopressor) tablet 25 mg: 25 mg | ORAL | @ 13:00:00 | Stop: 2022-11-04

## 2022-11-04 MED ADMIN — insulin NPH (HumuLIN,NovoLIN) injection 20 Units: 20 [IU] | SUBCUTANEOUS | @ 14:00:00 | Stop: 2022-11-04

## 2022-11-04 MED ADMIN — tacrolimus (PROGRAF) capsule 3 mg: 3 mg | ORAL | @ 13:00:00 | Stop: 2022-11-04

## 2022-11-04 MED ADMIN — insulin lispro (HumaLOG) injection 0-20 Units: 0-20 [IU] | SUBCUTANEOUS | @ 01:00:00

## 2022-11-04 MED ADMIN — metoPROLOL tartrate (Lopressor) tablet 25 mg: 25 mg | ORAL | @ 01:00:00

## 2022-11-04 MED ADMIN — insulin lispro (HumaLOG) injection 10 Units: 10 [IU] | SUBCUTANEOUS | @ 14:00:00 | Stop: 2022-11-04

## 2022-11-04 MED ADMIN — sertraline (ZOLOFT) tablet 50 mg: 50 mg | ORAL | @ 13:00:00 | Stop: 2022-11-04

## 2022-11-04 MED ADMIN — insulin lispro (HumaLOG) injection 0-20 Units: 0-20 [IU] | SUBCUTANEOUS | @ 18:00:00 | Stop: 2022-11-04

## 2022-11-04 MED ADMIN — predniSONE (DELTASONE) tablet 20 mg: 20 mg | ORAL | @ 13:00:00 | Stop: 2022-11-04

## 2022-11-04 MED FILL — HUMALOG KWIKPEN (U-100) INSULIN 100 UNIT/ML SUBCUTANEOUS: SUBCUTANEOUS | 50 days supply | Qty: 15 | Fill #0

## 2022-11-04 MED FILL — UNIFINE PENTIPS 32 GAUGE X 5/32" NEEDLE (4 MM): 25 days supply | Qty: 100 | Fill #0

## 2022-11-04 NOTE — Unmapped (Incomplete)
Pharmacist Discharge Note  Patient Name: Anthony Alvarado  Reason for admission: ***  Reason for writing this note: {Note Indication:51911}    Highlighted medication changes with rationale (if applicable):  - ***    Medication access:  {MEDACCESSTOC:51908}    Outpatient follow-up:  {Outpatient Follow-up:51909}    Drue Flirt, [facility]   Clinical Pharmacist    Future Appointments   Date Time Provider Department Center   11/09/2022 12:20 PM Rocco Serene, MD [facility]   11/18/2022  9:10 AM Theodosia Paling, CPP [facility]   11/20/2022  8:00 AM Lupu, Delmar Landau, MD [facility]   12/14/2022 10:00 AM Alice Reichert, MD [facility]   03/29/2023 12:40 PM Rocco Serene, MD [facility]

## 2022-11-04 NOTE — Unmapped (Signed)
Tacrolimus Therapeutic Monitoring Pharmacy Note  Anthony Alvarado is a 62 y.o. male continuing tacrolimus.     Indication: Liver transplant     Date of Transplant:  08/2018       Prior Dosing Information: Home regimen 3 mg BID      See Below for Current Inpatient Dosing and Levels    Source(s) of information used to determine prior to admission dosing: Home Medication List, Clinic Note, or Fill HIstory    Goals:  Therapeutic Drug Levels  Tacrolimus trough goal:  4-6 ng/mL  per 8/9 consult note    Additional Clinical Monitoring/Outcomes  Monitor renal function (SCr and urine output) and liver function (LFTs)  Monitor for signs/symptoms of adverse events (e.g., hyperglycemia, hyperkalemia, hypomagnesemia, hypertension, headache, tremor)    Results:     Creatinine   Date Value Ref Range Status   11/04/2022 0.80 0.73 - 1.18 mg/dL Final   09/81/1914 7.82 0.73 - 1.18 mg/dL Final   95/62/1308 6.57 0.73 - 1.18 mg/dL Final      Tacrolimus Dosing  - 9/6-current 3mg  BID    Tacrolimus Troughs  -9/6 3.5  -9/8 17.3 (drawn after morning dose given)  -9/9 10.4 ng/mL at 0615  -9/10 6.7 ng/mL at 0621  9/11 = 5.7 ng/mL at 0642    Pharmacokinetic Considerations and Significant Drug Interactions:  Concurrent hepatotoxic medications:  pravastatin (hm), dapsone, pioglitazone    Concurrent CYP3A4 substrates/inhibitors: azithromycin - last dose 9/19  Concurrent nephrotoxic medications: Bactrim(NOT on), Jardiance (NOT on) (hm)     Assessment/Plan unless otherwise indicated by Transplant Team  9/11 level drawn slightly early but within goal range (4-6ng/mL)  9/11 scr remains stable      PLAN unless otherwise indicated by Transplant Team  CONTINUE Tacrolimus 3mg  BID     Follow-up  Tacrolimus Level:  DAILY between 0600-0800 PRIOR to morning dose.  Dose due 0800  A pharmacist will continue to monitor and recommend levels as appropriate    Drue Flirt, [facility]    November 04, 2022 2:23 PM

## 2022-11-04 NOTE — Unmapped (Signed)
Physician Discharge Summary [facility]  [facility] OBSERVATION [facility]  [address]  [place] [address]  Dept: [phone]  Loc: [phone]     Identifying Information:   [name]  12-May-1960  [id number]    Primary Care Physician: [name], MD     Code Status: Full Code    Admit Date: 10/30/2022    Discharge Date: 11/04/2022     Discharge To: Home    Discharge Service: [facility] - General Medicine Floor Team (MED Aggie Hacker)     Discharge Attending Physician: Harlow Ohms, MD    Discharge Diagnoses:  Principal Problem:    Steroid-induced hyperglycemia (POA: Yes)  Active Problems:    Liver transplant recipient (CMS-HCC) (POA: Not Applicable)    Diabetes mellitus without complication (CMS-HCC) (POA: Yes)    Hypothyroidism (POA: Yes)    OSA (obstructive sleep apnea) (POA: Yes)    Essential hypertension (POA: Yes)    Pure hypercholesterolemia (POA: Yes)    Colitis (POA: Yes)    Leukocytosis (POA: Unknown)    Hyponatremia with increased serum osmolality (POA: Unknown)  Resolved Problems:    * No resolved hospital problems. *      Outpatient Provider Follow Up Issues:   Endocrine for BG control and weaning of insulin if able based on BG and steroid taper  Transplant team for RD at clinic    Hospital Course:   [name] is a 62 y.o. male whose presentation is complicated by cryptogenic cirrhosis s/p liver transplant (08/2018), T2DM, OSA, AS s/p AVR, CAD s/p CABG, hypothyroidism, and recent hospitalization for colitis  who presented to [facility] with hyperglycemia in setting of steroid use for colitis.  He was found to have possible CAP and started on insulin and now improved and stable to dc home with plan for PCP and transplant hepatology follow up.  Below are more details of his stay at [facility] according to problem:     Uncontrolled Type 2 DM with Steroid-Induced Hyperglycemia  Home diabetes regimen is Jardiance 25 mg daily and pioglitazone 30 mg daily. BG outpatient >500 and admitted for control.  Started on NPH and then mealtime with SSI used.  Now improving and has been educated by the DM education team.  Will plan on dc with home jardiance and pioglitazone with addition of NPH daily and Lispro with meals and has follow up with endocrine for ongoing management.  Suspect as pred taper occurs he will need less insulin.       Community Acquired Pneumonia    CXR with RLL opacity c/f atelectasis vs developing pneumonia. Patient started on CAP treatment with CTX and azithromycin as a result. Other than cough patient has remained relatively asymptomatic without fever or SOB.  Now s/p treatment with Ceftriaxone (EOT 9/11), azithromycin (EOT 9/9) and continues without supplemental oxygen need.      Hx of Cryptogenic Cirrhosis s/p Liver Transplant (08/2018) - MMF-induced colitis (improved)  Patient received liver transplant in 2020.  CellCept was discontinued during most recent hospitalization, and tacrolimus dose was increased. Hepatology consulted and will plan on ongoing pred with taper:  Prednisone 20 mg daily for 5 days  Prednisone 15 mg daily for 5 days  Prednisone 10 mg daily for 5 days  Prednisone 5 mg daily for 5 days   Prednisone 5 mg every other day for 5 days (3 doses),  home tacrolimus and outpatient labs and follow up with hepatology.      Hypertonic Hyponatremia  Iso hyperglycemia, corrected Na is wnl.      Malnutrition-severe  Severe Protein-Calorie Malnutrition in the context of acute illness or injury (11/04/22 1149)  Energy Intake: < or equal to 50% of estimated energy requirement of > or equal to 5 days  Interpretation of Wt. Loss: > 7.5% x 3 months  Fat Loss: Moderate  Malnutrition Score: 3  Agree with RD assessment and will recommend ongoing BG control with supplements in between meals as tolerated.  Can follow up with RD in transplant clinic.            Chronic Problems  Hypothyroidism: continue levothyroxine 175 mcg daily  HTN:  continue metoprolol  Psych: continue sertraline  Normocytic Anemia: hgb improved from baseline  CAD s/p CABG: continue aspirin, pravastatin          Touchbase with Outpatient Provider:  Warm Handoff: Completed on 11/03/22 by Mervin Kung, ANP  (APP) via Phone    Procedures:  None  No admission procedures for hospital encounter.  ______________________________________________________________________  Discharge Medications:     Your Medication List        START taking these medications      glucose 4 GM chewable tablet  Chew 4 tablets (16 g total) every ten (10) minutes as needed for low blood sugar ((For Blood Glucose LESS than 70 mg/dL and GREATER than or EQUAL to 54 mg/dL and able to take by mouth.)).     insulin lispro 100 unit/mL injection pen  Commonly known as: HumaLOG  Inject 10 Units under the skin Three (3) times a day before meals.     insulin NPH isoph U-100 human 100 unit/mL (3 mL) injection pen  Commonly known as: HumuLIN  Inject 20 Units under the skin daily. Take with first meal of the day            CHANGE how you take these medications      pioglitazone 15 MG tablet  Commonly known as: ACTOS  Take 1 tablet (15 mg total) by mouth daily.  What changed:   how much to take  additional instructions     predniSONE 5 MG tablet  Commonly known as: DELTASONE  Take 20mg  daily for 4 days, then 15mg  daily for 5 days, then 10mg  daily for 5 days, then 5mg  daily for 5 days, then 5mg  every other day for 3 doses, then stop  What changed:   medication strength  how much to take  how to take this  when to take this  additional instructions            CONTINUE taking these medications      aspirin 325 MG tablet  Take 1 tablet (325 mg total) by mouth daily.     azelastine 137 mcg (0.1 %) nasal spray  Commonly known as: ASTELIN  1 spray into each nostril daily as needed.     calcium carbonate 200 mg calcium (500 mg) chewable tablet  Commonly known as: TUMS  Chew 1 tablet (500 mg total) daily. Pt reports taking every other day.     dapsone 100 MG tablet  Take 1 tablet (100 mg total) by mouth daily.     empagliflozin 25 mg tablet  Commonly known as: JARDIANCE  Take 1 tablet (25 mg total) by mouth daily.     fluticasone propionate 50 mcg/actuation nasal spray  Commonly known as: FLONASE  PLACE ONE OR TWO SPRAYS INTO BOTH NOSTRILS DAILY AS NEEDED.     HEMORRHOID TOP  Apply topically.     hydrocortisone 1 % cream  Apply topically to hemorrhoids up to twice a day     levothyroxine 175 MCG tablet  Commonly known as: SYNTHROID  Take 1 tablet (175 mcg total) by mouth daily before breakfast.     metoPROLOL tartrate 25 MG tablet  Commonly known as: Lopressor  Take 1 tablet (25 mg total) by mouth two (2) times a day.     ONETOUCH ULTRA TEST Strp  Generic drug: blood sugar diagnostic  USE TO CHECK SUGAR DAILY     pravastatin 40 MG tablet  Commonly known as: PRAVACHOL  Take 1 tablet (40 mg total) by mouth every evening.     sertraline 50 MG tablet  Commonly known as: ZOLOFT  Take 1 tablet (50 mg total) by mouth daily.     tacrolimus 1 MG capsule  Commonly known as: PROGRAF  Take 3 capsules (3 mg total) by mouth two (2) times a day.              Allergies:  Mycophenolate mofetil and Watermelon flavor  ______________________________________________________________________  Pending Test Results (if blank, then none):  Pending Labs       Order Current Status    Blood Culture #1 Preliminary result    Blood Culture #2 Preliminary result            Most Recent Labs:  All lab results last 24 hours -   Recent Results (from the past 24 hour(s))   POCT Glucose    Collection Time: 11/03/22  4:51 PM   Result Value Ref Range    Glucose, POC 225 (H) 70 - 179 mg/dL   POCT Glucose    Collection Time: 11/03/22  9:14 PM   Result Value Ref Range    Glucose, POC 198 (H) 70 - 179 mg/dL   POCT Glucose    Collection Time: 11/03/22 11:23 PM   Result Value Ref Range    Glucose, POC 125 70 - 179 mg/dL   POCT Glucose    Collection Time: 11/04/22  3:22 AM   Result Value Ref Range    Glucose, POC 127 70 - 179 mg/dL   Basic metabolic panel    Collection Time: 11/04/22  6:42 AM   Result Value Ref Range    Sodium 139 135 - 145 mmol/L    Potassium 4.0 3.4 - 4.8 mmol/L    Chloride 107 98 - 107 mmol/L    CO2 29.0 20.0 - 31.0 mmol/L    Anion Gap 3 (L) 5 - 14 mmol/L    BUN 15 9 - 23 mg/dL    Creatinine 2.95 6.21 - 1.18 mg/dL    BUN/Creatinine Ratio 19     eGFR CKD-EPI (2021) Male >90 >=60 mL/min/1.37m2    Glucose 154 70 - 179 mg/dL    Calcium 9.0 8.7 - 30.8 mg/dL   Phosphorus Level    Collection Time: 11/04/22  6:42 AM   Result Value Ref Range    Phosphorus 3.6 2.4 - 5.1 mg/dL   Magnesium Level    Collection Time: 11/04/22  6:42 AM   Result Value Ref Range    Magnesium 1.4 (L) 1.6 - 2.6 mg/dL   Tacrolimus Level, Trough    Collection Time: 11/04/22  6:42 AM   Result Value Ref Range    Tacrolimus, Trough 5.7 5.0 - 15.0 ng/mL   POCT Glucose    Collection Time: 11/04/22  8:31 AM   Result Value Ref Range  Glucose, POC 146 70 - 179 mg/dL   POCT Glucose    Collection Time: 11/04/22 11:48 AM   Result Value Ref Range    Glucose, POC 278 (H) 70 - 179 mg/dL       Relevant Studies/Radiology (if blank, then none):  No results found.  ______________________________________________________________________  Discharge Instructions:   Activity Instructions       Activity as tolerated              Diet Instructions       Discharge diet (specify)      Discharge Nutrition Therapy: Regular    Use a high protein diabetic supplement in between meals.                Other Instructions       Call MD for:  difficulty breathing, headache or visual disturbances      Call MD for:  extreme fatigue      Call MD for:  persistent dizziness or light-headedness      Call MD for:  persistent nausea or vomiting      Call MD for:  severe uncontrolled pain      Call MD for: Temperature > 38.5 Celsius ( > 101.3 Fahrenheit)      Discharge instructions      [name], you were admitted to [facility] due to high blood sugar and we recommend that you now use insulin as prescribed and follow up closely with the [facility] endocrine clinic for ongoing management.  Note that your insulin needs will likely decrease as you wean off prednisone and your endocrine team can help guide the changes if needed.    The long acting insulin (NPH) will be taken in the morning with your first meal.  The shorter acting insulin (lispro/humalog) will be used with each meal.     Use glucose tabs if yo have lower blood sugar less than 70 and have symptoms (chills, severe hunger, dizziness, confusion, sweating)  If you have a lower blood sugar then seek medical care and be sure to hold your insulin and talk to your PCP and endocrine team.    Note the wean of prednisone.    COntinue to follow up with your transplant team for labs and guidance on medications.    Check your blood sugars prior to meals and before bedtime.  Take these results and your machine to follow up so your providers can better guide your care.                 Follow Up instructions and Outpatient Referrals     Call MD for:  difficulty breathing, headache or visual disturbances      Call MD for:  extreme fatigue      Call MD for:  persistent dizziness or light-headedness      Call MD for:  persistent nausea or vomiting      Call MD for:  severe uncontrolled pain      Call MD for: Temperature > 38.5 Celsius ( > 101.3 Fahrenheit)      Discharge instructions          Appointments which have been scheduled for you      Nov 09, 2022 12:20 PM  (Arrive by 12:05 PM)  RETURN  DIABETES with Rocco Serene, MD  [facility] DIABETES AND ENDOCRINOLOGY [place] [facility] REGION) [address]  [facility] 1 through 4  [place] [address]  [phone]  Nov 18, 2022 9:10 AM  (Arrive by 8:55 AM)  RETURN VIDEO DIRECT LINK with Theodosia Paling, CPP  [facility] DIABETES AND ENDOCRINOLOGY [place])  Arrive at: This is a Video Visit [address]  [facility] 1 through 4  [place] [address]  [phone] A direct link will be sent to you by your provider at the time of your video appointment. Please do NOT go to the clinic.     For your video visit, you will need a computer with a working camera, speaker and microphone, a smartphone, or a tablet with internet access.         Nov 20, 2022 8:00 AM  (Arrive by 7:45 AM)  RETURN  GENERAL with Pamalee Leyden, MD  [facility] REGION) [address]  [address] Floor  [place] [address]  [phone]        Dec 14, 2022 10:00 AM  (Arrive by 9:50 AM)  FOLLOW UP with Alice Reichert, MD  [facility] [address] [facility] [place] REGION) [address]  [place] [address]  [phone]        Mar 29, 2023 12:40 PM  (Arrive by 12:25 PM)  RETURN  DIABETES with Rocco Serene, MD  [facility] DIABETES AND ENDOCRINOLOGY [place] [facility] REGION) [address]  [facility] 1 through 4  [place] [address]  [phone]             ______________________________________________________________________  Discharge Day Services:  BP 137/83  - Pulse 72  - Temp 36.6 ??C (97.9 ??F) (Oral)  - Resp 17  - Ht 172.7 cm (5' 8)  - Wt 74.8 kg (165 lb)  - SpO2 92%  - BMI 25.09 kg/m??     Pt seen on the day of discharge and determined appropriate for discharge.    Condition at Discharge: stable    Length of Discharge: I spent greater than 30 mins in the discharge of this patient.

## 2022-11-04 NOTE — Unmapped (Signed)
VSS.   Pt afebrile pt continues on IV abx therapy..No falls noted. Pt educated to call for assistance if needed.. BG monitored every 4 hrs..No  Pain   medication requested . DVT protocol in place.pt on lovenox. Will continue to monitor.    Problem: Adult Inpatient Plan of Care  Goal: Plan of Care Review  Outcome: Progressing  Goal: Patient-Specific Goal (Individualized)  Outcome: Progressing  Goal: Absence of Hospital-Acquired Illness or Injury  Outcome: Progressing  Intervention: Identify and Manage Fall Risk  Recent Flowsheet Documentation  Taken 11/03/2022 2001 by Darene Lamer, RN  Safety Interventions:   low bed   fall reduction program maintained   nonskid shoes/slippers when out of bed  Intervention: Prevent Skin Injury  Recent Flowsheet Documentation  Taken 11/03/2022 2001 by Darene Lamer, RN  Positioning for Skin: Supine/Back  Device Skin Pressure Protection: adhesive use limited  Skin Protection: adhesive use limited  Goal: Optimal Comfort and Wellbeing  Outcome: Progressing  Goal: Readiness for Transition of Care  Outcome: Progressing  Goal: Rounds/Family Conference  Outcome: Progressing

## 2022-11-04 NOTE — Unmapped (Signed)
Adult Nutrition Assessment Note    Visit Type: MD Consult  Reason for Visit: Enteral Nutrition    NUTRITION ASSESSMENT     Current nutrition therapy is appropriate and progressing toward meeting meeting nutritional needs at this time.   Appetite, intake and weight having been slowing picking back up and patient is using oral nutrition supplements (Premier Protein) at home.     Malnutrition Assessment using AND/ASPEN or GLIM Clinical Characteristics:    Severe Protein-Calorie Malnutrition in the context of acute illness or injury (11/04/22 1149)  Energy Intake: < or equal to 50% of estimated energy requirement of > or equal to 5 days  Interpretation of Wt. Loss: > 7.5% x 3 months  Fat Loss: Moderate  Malnutrition Score: 3          Nutrition Focused Physical Exam:  Fat Areas Examined  Orbital: Mild loss  Upper Arm: Moderate loss      Muscle Areas Examined  Temple: Mild loss  Clavicle: Mild loss  Acromion: Mild loss  Scapular: Mild loss  Dorsal Hand: Mild loss  Patellar: Moderate loss  Anterior Thigh: Moderate loss  Posterior Calf: Mild loss                   Care plan:Yes     NUTRITION INTERVENTIONS and RECOMMENDATION     CCD diet  Glucerna po supplement BID  Obtain weight weekly    Follow-Up Parameters:   1-2 times per week (and more frequent as indicated)    HPI & PMH:   Per MD note 62 y.o. male whose presentation is complicated by cryptogenic cirrhosis s/p liver transplant (08/2018), T2DM, OSA, AS s/p AVR, CAD s/p CABG, hypothyroidism, and recent hospitalization for colitis that presented to [facility] with hyperglycemia ISO steroid use for colitis, CAP.      Anthropometric Data:  Height: 172.7 cm (5' 8)   Admission weight: 75.8 kg (167 lb)  Last recorded weight: 74.8 kg (165 lb)  IBW: 69.92 kg  Percent IBW: 107.04 %  BMI: Body mass index is 25.09 kg/m??.   Usual Body Weight:  207 lbs as of May 2024    Weight history prior to admission:  42 lb loss over 4 months (20%, significant)  Wt Readings from Last 10 Encounters: 10/31/22 74.8 kg (165 lb)   10/19/22 76.7 kg (169 lb 3.2 oz)   10/14/22 75.5 kg (166 lb 8 oz)   10/02/22 69.9 kg (154 lb)   09/15/22 69.9 kg (154 lb)   05/12/22 93.2 kg (205 lb 6.4 oz)   04/06/22 93.9 kg (207 lb)   08/28/21 92.4 kg (203 lb 9.6 oz)   09/17/20 95.8 kg (211 lb 3.2 oz)   03/20/20 93.4 kg (206 lb)        Weight changes this admission:   Last 5 Recorded Weights    10/30/22 1637 10/31/22 1352   Weight: 75.8 kg (167 lb) 74.8 kg (165 lb)        NUTRITIONALLY RELEVANT DATA     Medications:   Nutritionally pertinent medications reviewed and evaluated for potential food and/or medication interactions. Prednisone, actos, humulog,NPH    Labs:   Nutritionally pertinent labs reviewed and include Glucose: POC 198-125 mg/dL    Nutrition History:   November 04, 2022:   Prior to admission:  Pt started taking Ozempic for wt loss and DM control in May with subsequent wt loss. Since hospitalization for colitis pt has had a reduced appetite, per diet recall meeting <50% est needs.  Current Nutrition:  Oral intake     Nutrition Orders            Nutrition Therapy Regular/House starting at 09/06 2200            Nutritional Needs:   Healthy balance of carbohydrate, protein, and fat.     Allergies, Intolerances, Sensitivities, and/or Cultural/Religious Dietary Restrictions: include watermelon    GOALS and EVALUATION     Patient to meet 75% or greater of nutritional needs via combination of meals, snacks, and/or oral supplements within admit.  - New/Progressing    Motivation, Barriers, and Compliance:  Evaluation of motivation, barriers, and compliance pending at this time due to clinical status.       Discharge Planning:   Monitor for potential discharge needs with multi-disciplinary team.       Arelia Sneddon RD, LDN, CNSC  Pager [phone]

## 2022-11-04 NOTE — Unmapped (Signed)
Problem: Adult Inpatient Plan of Care  Goal: Plan of Care Review  11/03/2022 1737 by Tresa Res, RN  Outcome: Progressing  11/03/2022 1217 by Tresa Res, RN  Outcome: Progressing  Goal: Patient-Specific Goal (Individualized)  11/03/2022 1737 by Tresa Res, RN  Outcome: Progressing  11/03/2022 1217 by Tresa Res, RN  Outcome: Progressing  Goal: Absence of Hospital-Acquired Illness or Injury  11/03/2022 1737 by Tresa Res, RN  Outcome: Progressing  11/03/2022 1217 by Tresa Res, RN  Outcome: Progressing  Goal: Optimal Comfort and Wellbeing  11/03/2022 1737 by Tresa Res, RN  Outcome: Progressing  11/03/2022 1217 by Tresa Res, RN  Outcome: Progressing  Goal: Readiness for Transition of Care  11/03/2022 1737 by Tresa Res, RN  Outcome: Progressing  11/03/2022 1217 by Tresa Res, RN  Outcome: Progressing  Goal: Rounds/Family Conference  11/03/2022 1737 by Tresa Res, RN  Outcome: Progressing  11/03/2022 1217 by Tresa Res, RN  Outcome: Progressing

## 2022-11-05 NOTE — Unmapped (Signed)
Called pt. Feeling OK. Arrived home from hospital last night. Glucoses in 300's.  This morning fasting 166. Has not taken insulin yet but plan is  NPH 20 units in AM only  Humalog 10 units before meals.    If having any lows then he will cut humalog to 5 units until we speak.    He will keep a record of AC+HS glucoses and we will touch base tomorrow.    On pred taper (at 20 for this week) so anticipate should go down on insulin needs.

## 2022-11-06 NOTE — Unmapped (Signed)
Called pt to review glucoses.  Has been using NPH 20 and Humalog 10 with meals  Going down to Pred 15 tomorrow.    Yesterday  10AM 166  3:30PM 356  6:30PM 496  10PM 400    today  9AM: 216  11AM: 243    Plan discuseed with pt:  Today: use 14 Humalog with Lunch and dinner  Sat 9/13: Increase NPH to 24 units QAM and then 10 Humalog with BF and 14 with Lunch and Dinner    He does understand that if eating small meal then could use 1-2 units less humalog. Also needs may go down as his prednisone decreases too.    Will see him on Monday.

## 2022-11-08 NOTE — Progress Notes (Unsigned)
Cardiology Office Note   Date:  11/09/2022   ID:  Kruz, Garr 01-07-1961, MRN [id number]  PCP:  Joaquim Nam, MD  Cardiologist:   Dietrich Pates, MD   F/U of aortic stenosis and CAD     History of Present Illness: [name] is a 62 y.o. male with a history of aortic stenosis, cirrhosis, HTN. OSA (On CPAP.   As pretransplant evaluation in 2019 L heart cath  done   [facility] showed 40% D1, 30 to 40% D2; 40 to 50% OM2   LVEF normal   Mild AS    Echo in October 2022 showed LVEF normal   Mod LVH   Mean grad 31 mm I  DVI 0.22.  CTA showed multiple multilple nonobstructie PE   Started on Eliquis e underwent CABG x 1(LIMA to LAD) and AVR with Inspiriris Resilia pericardial valve on 04/03/21     I saw the pt in March 2024  Doing OK but said his breathing was about the same as before surgery, did not get an energy jump   Discussed food intake (lmit processed/fast foods)  IN June the pt was admitted for colitis Initially at [facility]  Then at [facility] .  Hgb at one point 6.6.   Rx Abx    Pt had syncopal spell   BP 70s/   Responded to fluids   Felt vagal, occurred on commode      In Sept 2024 he was admitted for pneumonia and high blood sugars     Since d/c he still has a mild cough   Has some sputum production.  Does no look at color    [employer] not at baseline   He denies SSCP  Says he has back pain that radiates from back to chest   Worse with deep breath  No palpitations   nO dizziness   No BRBPR Appt with endocrine today   Notes sugars increased, esp in afternoon/evening    Current Meds  Medication Sig   acetaminophen (TYLENOL) 500 MG tablet Take 500 mg by mouth every 6 (six) hours as needed for moderate pain or headache.   albuterol (VENTOLIN HFA) 108 (90 Base) MCG/ACT inhaler Inhale 2 puffs into the lungs every 6 (six) hours as needed for wheezing or shortness of breath.   alendronate (FOSAMAX) 70 MG tablet Take 1 tablet (70 mg total) by mouth every Sunday. Take  with a full glass of water on an empty stomach.   aspirin EC 325 MG EC tablet Take 1 tablet (325 mg total) by mouth daily.   azelastine (ASTELIN) 0.1 % nasal spray PLACE 2 SPRAYS INTO BOTH NOSTRILS TWICE DAILY AS NEEDED   calcium carbonate (TUMS - DOSED IN MG ELEMENTAL CALCIUM) 500 MG chewable tablet Chew 1 tablet by mouth daily.   Cholecalciferol (VITAMIN D) 50 MCG (2000 UT) tablet Take 2,000 Units by mouth daily.   dapsone 100 MG tablet Take 1 tablet (100 mg total) by mouth daily.   fluticasone (FLONASE) 50 MCG/ACT nasal spray PLACE ONE OR TWO SPRAYS INTO BOTH NOSTRILS DAILY AS NEEDED.   hydrocortisone cream 1 % Apply to affected area 2 times daily   insulin lispro (HUMALOG) 100 UNIT/ML KwikPen Inject 14 Units into the skin 3 (three) times daily.   Insulin NPH, Human,, Isophane, (HUMULIN N) 100 UNIT/ML Kiwkpen Inject 24 Units into the skin daily.   levothyroxine (SYNTHROID) 175 MCG tablet Take 1 tablet (175 mcg total) by mouth daily  before breakfast.   metoprolol tartrate (LOPRESSOR) 25 MG tablet Take 1 tablet (25 mg total) by mouth two (2) times a day.   metroNIDAZOLE (METROGEL) 1 % gel Apply 1 application  topically daily as needed (rosacea).   NON FORMULARY Pt uses a cpap nightly   ondansetron (ZOFRAN) 4 MG tablet Take 1 tablet (4 mg total) by mouth every 6 (six) hours.   OneTouch Delica Lancets 33G MISC USE TO CHECK SUGAR DAILY   ONETOUCH ULTRA test strip USE TO CHECK SUGAR DAILY   pioglitazone (ACTOS) 15 MG tablet Take 1 tablet by mouth daily.   pravastatin (PRAVACHOL) 40 MG tablet Take 1 tablet (40 mg total) by mouth every evening.   predniSONE (DELTASONE) 10 MG tablet Take 4 tablets (40 mg total) by mouth daily with breakfast.   sertraline (ZOLOFT) 50 MG tablet Take 1 tablet (50 mg total) by mouth daily.   tacrolimus (PROGRAF) 1 MG capsule Take 3 capsules (3 mg total) by mouth 2 (two) times daily.   [DISCONTINUED] linagliptin (TRADJENTA) 5 MG TABS tablet Take 1 tablet (5 mg total) by  mouth daily.     Allergies:   Mycophenolate mofetil and Watermelon flavor   Past Medical History:  Diagnosis Date   Allergy    Asthma    Bicuspid aortic valve    sees dr Tenny Craw   Cirrhosis [facility])    Coronary artery disease    DM2 (diabetes mellitus, type 2) (HCC)    Fatty liver    with h/o elevated LFT's   GERD (gastroesophageal reflux disease)    Heart murmur    Hypertension    Itching    all over last few months   Jaundice    Liver transplant recipient (HCC)    09/16/2018 at [facility]   Migraine with aura    OSA (obstructive sleep apnea) 09/14/2011   PSG 11/08/11>>AHI 31.6, SpO2 low 85%. wears CPAP, pt does not know settings   Pulmonary embolism (HCC)    2022   Sleep apnea    Thyroid disease     Past Surgical History:  Procedure Laterality Date   AORTIC VALVE REPLACEMENT N/A 04/03/2021   Procedure: AORTIC VALVE REPLACEMENT (AVR) USING 27 MM INSPIRIS RESILIA  AORTIC VALVE;  Surgeon: Alleen Borne, MD;  Location: [facility] OR;  Service: Open Heart Surgery;  Laterality: N/A;   APPENDECTOMY  11/2007   Emergency   BIOPSY  09/22/2022   Procedure: BIOPSY;  Surgeon: Imogene Burn, MD;  Location: [facility] ENDOSCOPY;  Service: Gastroenterology;;   BIOPSY THYROID  08/19/2007   Attempted, no tissue obtained   CARDIAC CATHETERIZATION  03/21/2018   CARDIAC VALVE REPLACEMENT     Feb 2023   CARDIOVASCULAR STRESS TEST  04/2005   Negative 06/05   CHOLECYSTECTOMY     CORONARY ARTERY BYPASS GRAFT N/A 04/03/2021   Procedure: CORONARY ARTERY BYPASS GRAFTING (CABG) x ONE ON CARDIOPULMONARY BYPASS. LIMA TO LAD;  Surgeon: Alleen Borne, MD;  Location: [facility] OR;  Service: Open Heart Surgery;  Laterality: N/A;   DOPPLER ECHOCARDIOGRAPHY  07/2002   ESOPHAGEAL BANDING N/A 05/04/2016   Procedure: ESOPHAGEAL BANDING;  Surgeon: Iva Boop, MD;  Location: [facility] ENDOSCOPY;  Service: Endoscopy;  Laterality: N/A;   ESOPHAGOGASTRODUODENOSCOPY (EGD) WITH PROPOFOL N/A 05/04/2016   Procedure: ESOPHAGOGASTRODUODENOSCOPY  (EGD) WITH PROPOFOL;  Surgeon: Iva Boop, MD;  Location: [facility] ENDOSCOPY;  Service: Endoscopy;  Laterality: N/A;   ESOPHAGOGASTRODUODENOSCOPY (EGD) WITH PROPOFOL N/A 09/22/2022   Procedure: ESOPHAGOGASTRODUODENOSCOPY (EGD) WITH PROPOFOL;  Surgeon: Imogene Burn, MD;  Location[facility] ENDOSCOPY;  Service: Gastroenterology;  Laterality: N/A;   FLEXIBLE SIGMOIDOSCOPY N/A 09/22/2022   Procedure: FLEXIBLE SIGMOIDOSCOPY;  Surgeon: Imogene Burn, MD;  Location: [facility] ENDOSCOPY;  Service: Gastroenterology;  Laterality: N/A;   LIVER TRANSPLANT     09/16/2018 at [facility]   RIGHT/LEFT HEART CATH AND CORONARY ANGIOGRAPHY N/A 12/20/2020   Procedure: RIGHT/LEFT HEART CATH AND CORONARY ANGIOGRAPHY;  Surgeon: Kathleene Hazel, MD;  Location: [facility] INVASIVE CV LAB;  Service: Cardiovascular;  Laterality: N/A;   TEE WITHOUT CARDIOVERSION N/A 04/03/2021   Procedure: TRANSESOPHAGEAL ECHOCARDIOGRAM (TEE);  Surgeon: Alleen Borne, MD;  Location: [facility] OR;  Service: Open Heart Surgery;  Laterality: N/A;     Social History:  The patient  reports that he has never smoked. He has been exposed to tobacco smoke. He has never used smokeless tobacco. He reports that he does not drink alcohol and does not use drugs.   Family History:  The patient's family history includes Asthma in his mother; COPD in his father; Cancer in his father; Colon cancer in his maternal grandmother; Heart disease in his maternal grandfather and sister; Liver disease in his sister; Prostate cancer in his maternal uncle.    ROS:  Please see the history of present illness. All other systems are reviewed and  Negative to the above problem except as noted.    PHYSICAL EXAM: VS:  BP (!) 142/82   Pulse 71   Ht 5\' 8"  (1.727 m)   Wt 173 lb 12.8 oz (78.8 kg)   SpO2 95%   BMI 26.43 kg/m   GEN: Pt is a 62 yo in no acute distress  Neck: JVP normal    No carotid bruit Cardiac: RRR  no murmurs   No LE  edema  Respiratory:  clear to auscultation bilaterally,  Mild wheeze on forced expiration   GI: soft, nontender,  No hepatomegaly  MS: no deformity Moving all extremities   Skin: warm and dry, no rash  Neuro:  Strength and sensation are intact Psych: euthymic mood, full affect   EKG:  EKG is not done today   Echo   05/15/21 Left ventricular ejection fraction, by estimation, is 55 to 60%. The left ventricle has normal function. The left ventricle has no regional wall motion abnormalities. There is moderate concentric left ventricular hypertrophy. Left ventricular diastolic parameters are consistent with Grade II diastolic dysfunction (pseudonormalization). Elevated left atrial pressure. The average left ventricular global longitudinal strain is -16.3 %. The global longitudinal strain is abnormal. 1. Right ventricular systolic function is normal. The right ventricular size is normal. Tricuspid regurgitation signal is inadequate for assessing PA pressure. 2. 3. Left atrial size was mildly dilated. 4. The mitral valve is normal in structure. No evidence of mitral valve regurgitation. The aortic valve has been repaired/replaced. Aortic valve regurgitation is not visualized. There is a 27 mm Inspiris Resilia bioprosthetic valve present in the aortic position. Procedure Date: 04/03/21. Echo findings are consistent with normal structure and function of the aortic valve prosthesis. Aortic valve mean gradient measures 6.0 mmHg. Aortic valve Vmax measures 1.66 m/s. 5. Aortic dilatation noted. There is borderline dilatation of the aortic root, measuring 38 mm. There is mild dilatation of the ascending aorta, measuring 41 mm.  LHeart cath   12/20/20   Prox RCA lesion is 20% stenosed.   Mid RCA to Dist RCA lesion is 20% stenosed.   RPDA lesion is 20% stenosed.   Mid LAD lesion  is 80% stenosed.   Severe mid LAD stenosis Mild non-obstructive disease in the RCA and Circumflex Severe aortic stenosis (mean gradient 50.6 mmHg, peak to peak gradient 66  mmHg, AVA 0.78 cm2).    Recommendations: He has a severe mid LAD stenosis (not critical) and severe AS with bicuspid aortic valve. Ideally he would be treated with single vessel CABG (LIMA to LAD) and surgical AVR given his young age and given the pathology associated with bicuspid aortic valves. Given his prior liver transplantation, he will be higher risk for CABG/AVR. If he is not felt to be a good candidate for surgery, will plan PCI of the LAD followed by TAVR. Will review with the structural heart team and plan for him to see Dr. Laneta Simmers in the CT surgery office following his CT scans.   Lipid Panel    Component Value Date/Time   CHOL 113 01/01/2022 1028   TRIG 465 (H) 01/01/2022 1028   HDL 28 (L) 01/01/2022 1028   CHOLHDL 4.0 01/01/2022 1028   CHOLHDL 3 11/18/2020 0958   VLDL 79.4 (H) 11/18/2020 0958   LDLCALC 22 01/01/2022 1028   LDLDIRECT 35.0 11/18/2020 0958      Wt Readings from Last 3 Encounters:  11/09/22 173 lb 12.8 oz (78.8 kg)  10/09/22 163 lb (73.9 kg)  09/21/22 154 lb (69.9 kg)      ASSESSMENT AND PLAN:  1  Aortic stenosis  Pt s/p AVR   Echo in March 2023 valve functioning well  Follow     2  CAD   Pt s/p CABG x 1 with LIMA to LAD for 80% stenosis I do not think symptoms represent angina   Follow    3  Pulmonary  REcovering from pneumonia   Miild wheeze   Will get CXR to confirm resolution.  4  HTN  BP is mildly increased   Bettter on other visits   follow    5  DM   Reivewed diet  Minimally processed, whole natureal food   Limit carbs   6  HL  LDL 22    Follow   7  GI   Follows at [facility] with GI for colitis and liver transplant service     Follow up in Feb 2024     Current medicines are reviewed at length with the patient today.  The patient does not have concerns regarding medicines.  Signed, Dietrich Pates, MD  11/09/2022 9:20 AM    [facility] [address] [place], [place], [place]  [address] Phone: [phone]; Fax: [phone]

## 2022-11-09 ENCOUNTER — Ambulatory Visit: Payer: Medicare Other | Attending: Internal Medicine | Admitting: Internal Medicine

## 2022-11-09 ENCOUNTER — Encounter: Payer: Self-pay | Admitting: Internal Medicine

## 2022-11-09 ENCOUNTER — Ambulatory Visit: Admit: 2022-11-09 | Discharge: 2022-11-10 | Payer: MEDICARE

## 2022-11-09 VITALS — BP 142/82 | HR 71 | Ht 68.0 in | Wt 173.8 lb

## 2022-11-09 DIAGNOSIS — E1165 Type 2 diabetes mellitus with hyperglycemia: Principal | ICD-10-CM

## 2022-11-09 DIAGNOSIS — Z944 Liver transplant status: Principal | ICD-10-CM

## 2022-11-09 DIAGNOSIS — Z5181 Encounter for therapeutic drug level monitoring: Principal | ICD-10-CM

## 2022-11-09 DIAGNOSIS — E612 Magnesium deficiency: Principal | ICD-10-CM

## 2022-11-09 DIAGNOSIS — R062 Wheezing: Secondary | ICD-10-CM | POA: Diagnosis not present

## 2022-11-09 DIAGNOSIS — R0602 Shortness of breath: Secondary | ICD-10-CM | POA: Diagnosis not present

## 2022-11-09 MED ORDER — FREESTYLE LIBRE 3 SENSOR DEVICE
11 refills | 0 days | Status: CP
Start: 2022-11-09 — End: ?

## 2022-11-09 NOTE — Patient Instructions (Signed)
Medication Instructions:  Your physician recommends that you continue on your current medications as directed. Please refer to the Current Medication list given to you today.  *If you need a refill on your cardiac medications before your next appointment, please call your pharmacy*  Lab Work: None ordered today.  Testing/Procedures: Your physician has requested you have a chest x-ray performed to evaluated shortness of breath/wheezing. You can do this at your convenience at:  [facility] [address] [place], [place], [place] [address] Areas served: [place] and nearby areas Hours: 6:30 AM to 7:00 PM every day Phone: [phone]  You do not need an appointment.  Follow-Up: At [facility], you and your health needs are our priority.  As part of our continuing mission to provide you with exceptional heart care, we have created designated Provider Care Teams.  These Care Teams include your primary Cardiologist (physician) and Advanced Practice Providers (APPs -  Physician Assistants and Nurse Practitioners) who all work together to provide you with the care you need, when you need it.  Your next appointment:   5 month(s)  The format for your next appointment:   In Person  Provider:   Dietrich Pates, MD {

## 2022-11-09 NOTE — Unmapped (Signed)
ASSESSMENT and PLAN:   81 M s/p liver transplant 2022, recent MRI liver with some steatosis in transplant, obesity, T2DM  Over past 2 weeks has been hospitalized for hyperglycemia in setting of high dose steroid use (for colitis) and pneumonia  Still ramping up insulin to meet needs  CGM reviewed and patterns very consistent with steroid induced hyperglycemia with OK sugars overnight and then very very high post-prandials    1. Diabetes mellitus with hyperglycemia (CMS-HCC)    -- Continue taking the Jardiance 25 mg daily  -- Increase Pioglitazone to 30 mg once a day.   -- continue to hold the Mteformin while still having some GI effects from colitis  -- Increase NPH to 26 units in AM  -- Increase humalog according to a scale, printed out for pt.    Breakfast Lunch    Dinner  Below 150:      10  14  14   151-200:  12  16  16   201-250: 14  18  18   >251:   14  20  20     -- continue Freestyle Libre 3    He knows that this may need to be adjusted as he is coming down on his steroid taper. If having lows, will cut down and let me know    -- has appt already with Dr. Marcella Dubs in 10 days and can review insulin regimen at that time.      SUBJECTIVE:   Anthony Alvarado is a 62 y.o. year old male with a history of diabetes post-transplant who presents today for a diabetes-related visit. PMH includes liver transplant (2022), hepatic steatosis, obesity, hypothyroidism, and HTN, CAD s/p CABG (2023)    INTERIM:  Admitted for hyperglycemia form steroids  Also found to have pneumonia  Have been working with him over phone to ramp up insulin. Using NPH + Humlaog during the day only as typical for steroid induced post prandial highs          DM history:  2018: had pre-DM before liver transplant and controlled with diet, transiently on insulin but able to stop  2020: Liver transplant and since transplant has been on MTF and jardiance    Weight: since transplant has gained about 25 pounds; minimal exercise due to habit  Diet: cereal for breakfast, fast food at least once a day because too tired to cook    PCP Dr. 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      azelastine (ASTELIN) 137 mcg (0.1 %) nasal spray 1 spray into each nostril daily as needed.      blood sugar diagnostic (ONETOUCH ULTRA TEST) Strp USE TO CHECK SUGAR DAILY      calcium carbonate (TUMS) 200 mg calcium (500 mg) chewable tablet Chew 1 tablet (500 mg total) daily. Pt reports taking every other day.      dapsone 100 MG tablet Take 1 tablet (100 mg total) by mouth daily. 30 tablet 2    empagliflozin (JARDIANCE) 25 mg tablet Take 1 tablet (25 mg total) by mouth daily.      fluticasone propionate (FLONASE) 50 mcg/actuation nasal spray PLACE ONE OR TWO SPRAYS INTO BOTH NOSTRILS DAILY AS NEEDED.      glucose 4 GM chewable tablet Chew 4 tablets (16 g total) every ten (10) minutes as needed for low blood sugar ((For Blood Glucose LESS than 70 mg/dL and GREATER than or EQUAL to 54 mg/dL and able to take by mouth.)). 50 tablet 12    glycerin/witch hazel leaf (HEMORRHOID TOP) Apply topically.      hydrocortisone 1 % cream Apply topically to hemorrhoids up to twice a day 30 g 0    insulin lispro (HUMALOG) 100 unit/mL injection pen Inject 10 Units under the skin Three (3) times a day before meals. 15 mL 0    insulin NPH isoph U-100 human (HUMULIN) 100 unit/mL (3 mL) injection pen Inject 20 Units under the skin daily. Take with first meal of the day 15 mL 0    levothyroxine (SYNTHROID) 175 MCG tablet Take 1 tablet (175 mcg total) by mouth daily before breakfast. 90 tablet 1    metoPROLOL tartrate (LOPRESSOR) 25 MG tablet Take 1 tablet (25 mg total) by mouth two (2) times a day. 180 tablet 1    pen needle, diabetic 32 gauge x 5/32 (4 mm) Ndle Use with insulin up to 4 times/day as needed. 100 each 1    pravastatin (PRAVACHOL) 40 MG tablet Take 1 tablet (40 mg total) by mouth every evening. 30 tablet 1    predniSONE (DELTASONE) 5 MG tablet Take 4 tablets (20 mg total) by mouth daily for 4 days, THEN 3 tablets (15 mg total) daily for 5 days, THEN 2 tablets (10 mg total) daily for 5 days, THEN 1 tablet (5 mg total) daily for 5 days, THEN 1 tablet (5 mg total) every other day for 3 doses then stop. 49 tablet 0    sertraline (ZOLOFT) 50 MG tablet Take 1 tablet (50 mg total) by mouth daily. 90 tablet 3    tacrolimus (PROGRAF) 1 MG capsule Take 3 capsules (3 mg total) by mouth two (2) times a day. 180 capsule 11    blood-glucose sensor (FREESTYLE LIBRE 3 SENSOR) Devi For use as directed 3 each 11    pioglitazone (ACTOS) 30 MG tablet Take 1 tablet (30 mg total) by mouth daily. 90 tablet 3     No current facility-administered medications for this visit.     Facility-Administered Medications Ordered in Other Visits   Medication Dose Route Frequency Provider Last Rate Last Admin    diazePAM (VALIUM) 5 mg/mL injection                Allergies   Allergen Reactions    Mycophenolate Mofetil Diarrhea     Mycophenolate colitis     Watermelon Flavor      Mouth  itching       Social History  Social History     Socioeconomic History    Marital status: Legally Separated Tobacco Use    Smoking status: Never    Smokeless tobacco: Never   Vaping Use    Vaping status: Never Used   Substance and Sexual Activity    Alcohol use: Not Currently    Drug use: Never   Other Topics Concern    Do you use sunscreen? Yes    Tanning bed use? No    Are you easily burned? Yes    Excessive sun exposure? No    Blistering sunburns? Yes     Social Determinants of Health     Financial Resource Strain: Low Risk  (09/30/2022)    Overall Financial Resource Strain (CARDIA)     Difficulty of Paying Living Expenses: Not very hard   Food Insecurity: No Food Insecurity (09/22/2022)    Received from [facility]    Hunger Vital Sign     Worried About Running Out of Food in the Last Year: Never true     Ran Out of Food in the Last Year: Never true   Transportation Needs: No Transportation Needs (09/22/2022)    Received from [facility] - Transportation     Lack of Transportation (Medical): No     Lack of Transportation (Non-Medical): No   Physical Activity: Unknown (08/18/2022)    Received from [name]    Exercise Vital Sign     Days of Exercise per Week: Patient declined     Minutes of Exercise per Session: 0 min   Stress: No Stress Concern Present (08/18/2022)    Received from [facility] of Occup[facility] - Occupational Stress Questionnaire     Feeling of Stress : Only a little   Social Connections: Moderately Integrated (08/18/2022)    Received from [facility]    Social Connection and Isolation Panel [NHANES]     Frequency of Communication with Friends and Family: More than three times a week     Frequency of Social Gatherings with Friends and Family: Once a week     Attends Religious Services: More than 4 times per year     Active Member of [employer] or Organizations: Yes     Attends Engineer, structural: More than 4 times per year     Marital Status: Separated        Family History  No DM in family    Social Hx:  On disability  Smoke: none  EtOH: none       Review of Systems  A 10 systems reviewed and were negative except for pertinent items noted in the HPI and below:    Eyes: denies blurred vision; last dilated eye exam showed no diabetic retinopathy  CVS: denies CP   PULM:  denies SOB     OBJECTVE:     Vitals:    11/09/22 1203   BP: 131/80   Pulse: 71   Temp: 36.2 ??C (97.2 ??F)        Gen:  In no apparent distress, appears consistent with stated age   Eyes:  Anicteric, EOMI  Pulm: equal and symmetric respiratory effort  Abd: soft, non-tender, non-distended   MUSK: normal station and gait  Psych:  Alert and oriented x 4; mood is not down, depressed or anxious    Lab Review    DIABETES MELLITUS RESULTS:  Hemoglobin A1C (%)   Date Value   09/15/2022 7.4 (H)   04/06/2022 7.4 (H)   09/18/2019 6.6 (H)     Glucose, POC (mg/dL)   Date Value   57/84/6962 196 (H)   11/04/2022 278 (H)   11/04/2022 146         Lab Results   Component Value Date    CREATININE 0.80 11/04/2022     Lab Results   Component Value Date    CHOL 122 04/17/2019     Lab Results   Component Value Date    HDL 33 (L) 04/17/2019     No components found for: LDLCALC, DIRECTLDL  Lab Results   Component Value Date    TRIG 185 (H) 04/17/2019     Lab Results   Component Value Date    TSH 1.440 06/19/2019     Creatinine (mg/dL)   Date Value   95/28/4132 0.80   11/03/2022 0.78   11/02/2022 0.89   10/30/2022 1.21   10/21/2022 0.81   10/08/2022 0.91

## 2022-11-09 NOTE — Unmapped (Addendum)
1) Increase NPH to 26 units every morning    2) for meals, use the humalog 15-20 minutes before you eat    If blood sugar is    Breakfast Lunch    Dinner  Below 150:      10  14  14   151-200:  12  16  16   201-250: 14  18  18   >251:   14  20  20     3) Increase your pioglitazone to 30 mg daily.    4) Keep your other meds the same

## 2022-11-09 NOTE — Unmapped (Signed)
FreeStyle [employer] to [employer].     POC glucose and A1C done today.     PP 9:30 am     196 mg/dL.    09/15/2022  RESULT: 7.4

## 2022-11-10 ENCOUNTER — Ambulatory Visit
Admission: RE | Admit: 2022-11-10 | Discharge: 2022-11-10 | Disposition: A | Discharge status: Home / Self Care | Payer: Medicare Other | Source: Ambulatory Visit | Attending: Internal Medicine | Admitting: Internal Medicine

## 2022-11-10 DIAGNOSIS — R0602 Shortness of breath: Secondary | ICD-10-CM

## 2022-11-10 DIAGNOSIS — R062 Wheezing: Secondary | ICD-10-CM

## 2022-11-10 DIAGNOSIS — J189 Pneumonia, unspecified organism: Secondary | ICD-10-CM | POA: Diagnosis not present

## 2022-11-10 NOTE — Unmapped (Signed)
[facility] Refill Coordination Note    Specialty Medication(s) to be Shipped:   Transplant: tacrolimus 1mg     Other medication(s) to be shipped:  [facility] sensor, levothyroxine       Kordae Bensman, DOB: [birth year]  Phone: [phone] (home)       All above HIPAA information was verified with patient.     Was a Nurse, learning disability used for this call? No    Completed refill call assessment today to schedule patient's medication shipment from the [facility] [phone]).  All relevant notes have been reviewed.     Specialty medication(s) and dose(s) confirmed: Regimen is correct and unchanged.   Changes to medications: Jamaine reports no changes at this time.  Changes to insurance: No  New side effects reported not previously addressed with a pharmacist or physician: None reported  Questions for the pharmacist: No    Confirmed patient received a Conservation officer, historic buildings and a Surveyor, mining with first shipment. The patient will receive a drug information handout for each medication shipped and additional FDA Medication Guides as required.       DISEASE/MEDICATION-SPECIFIC INFORMATION        N/A    SPECIALTY MEDICATION ADHERENCE     Medication Adherence    Patient reported X missed doses in the last month: 0  Specialty Medication: tacrolimus 1 MG capsule (PROGRAF)  Patient is on additional specialty medications: No  Informant: patient              Were doses missed due to medication being on hold? No      tacrolimus 1 MG capsule (PROGRAF): 7-10 days of medicine on hand          REFERRAL TO PHARMACIST     Referral to the pharmacist: Not needed      [facility]     Shipping address confirmed in Epic.       Delivery Scheduled: Yes, Expected medication delivery date: 11/13/2022.     Medication will be delivered via UPS to the prescription address in Epic WAM.    Alwyn Pea   [facility] Specialty Technician

## 2022-11-11 ENCOUNTER — Other Ambulatory Visit: Payer: Self-pay | Admitting: Family Medicine

## 2022-11-11 DIAGNOSIS — J189 Pneumonia, unspecified organism: Secondary | ICD-10-CM

## 2022-11-12 ENCOUNTER — Ambulatory Visit (INDEPENDENT_AMBULATORY_CARE_PROVIDER_SITE_OTHER): Payer: Medicare Other | Admitting: Family Medicine

## 2022-11-12 ENCOUNTER — Encounter: Payer: Self-pay | Admitting: Family Medicine

## 2022-11-12 VITALS — BP 110/82 | HR 85 | Temp 97.7°F | Ht 68.0 in | Wt 165.0 lb

## 2022-11-12 DIAGNOSIS — J189 Pneumonia, unspecified organism: Secondary | ICD-10-CM | POA: Diagnosis not present

## 2022-11-12 MED ORDER — DOXYCYCLINE HYCLATE 100 MG PO TABS
100.0000 mg | ORAL_TABLET | Freq: Two times a day (BID) | ORAL | 0 refills | Status: DC
Start: 2022-11-12 — End: 2023-01-05

## 2022-11-12 MED ORDER — AMOXICILLIN-POT CLAVULANATE 875-125 MG PO TABS: 1.0000 | ORAL_TABLET | Freq: Two times a day (BID) | ORAL | Status: DC

## 2022-11-12 MED ORDER — DOXYCYCLINE HYCLATE 100 MG PO TABS: 100.0000 mg | ORAL_TABLET | Freq: Two times a day (BID) | ORAL | Status: DC

## 2022-11-12 MED ORDER — AMOXICILLIN-POT CLAVULANATE 875-125 MG PO TABS: 1.0000 | ORAL_TABLET | Freq: Two times a day (BID) | ORAL | 0 refills | Status: DC

## 2022-11-12 NOTE — Patient Instructions (Addendum)
Go to the lab on the way out.   If you have mychart we'll likely use that to update you.     Please check with [facility]/pharmacy about dapsone use.  Repeat CXR in about 2-3 weeks here at [facility].   Start doxycycline and augmentin.   Use albuterol if needed.  Update [place] in a few days.   If worse, then go to the ER.  Take care.  Glad to see you.

## 2022-11-12 NOTE — Progress Notes (Signed)
Prev inpatient CXR with RLL opacity c/f atelectasis vs developing pneumonia. Patient started on CAP treatment with CTX and azithromycin as a result. Other than cough patient has remained relatively asymptomatic without fever or SOB. Now s/p treatment with Ceftriaxone (EOT 9/11), azithromycin (EOT 9/9) and continues without supplemental oxygen need. =====================================  Still with cough, some sputum- clear as of this AM.  Still on prednisone, 10mg  per day.  More muscle cramps recently.  No known fevers.  SABA helps.    Sugar has improved in the meantime. 200s  He is off dapsone currently.  It was listed to continue per his discharge summary from [facility].  D/w pt.  See AVS.    Most recent x-ray images reviewed with patient.  See notes on labs.  Meds, vitals, and allergies reviewed.   ROS: Per HPI unless specifically indicated in ROS section   Nad Ncat MMM Neck supple, no LA rrr Scattered rhonchi on chest exam, no focal dec in BS.   Abdomen soft.  Nontender. Skin well-perfused.  No jaundice.

## 2022-11-13 LAB — CBC WITH DIFFERENTIAL/PLATELET
Basophils Absolute: 0.1 10*3/uL (ref 0.0–0.1)
Basophils Relative: 0.8 % (ref 0.0–3.0)
Eosinophils Absolute: 0 10*3/uL (ref 0.0–0.7)
Eosinophils Relative: 0.2 % (ref 0.0–5.0)
HCT: 35.4 % — ABNORMAL LOW (ref 39.0–52.0)
Hemoglobin: 11.1 g/dL — ABNORMAL LOW (ref 13.0–17.0)
Lymphocytes Relative: 9.8 % — ABNORMAL LOW (ref 12.0–46.0)
Lymphs Abs: 1.5 10*3/uL (ref 0.7–4.0)
MCHC: 31.4 g/dL (ref 30.0–36.0)
MCV: 93.9 fl (ref 78.0–100.0)
Monocytes Absolute: 0.8 10*3/uL (ref 0.1–1.0)
Monocytes Relative: 5.1 % (ref 3.0–12.0)
Neutro Abs: 13.1 10*3/uL — ABNORMAL HIGH (ref 1.4–7.7)
Neutrophils Relative %: 84.1 % — ABNORMAL HIGH (ref 43.0–77.0)
Platelets: 396 10*3/uL (ref 150.0–400.0)
RBC: 3.77 Mil/uL — ABNORMAL LOW (ref 4.22–5.81)
RDW: 17 % — ABNORMAL HIGH (ref 11.5–15.5)
WBC: 15.6 10*3/uL — ABNORMAL HIGH (ref 4.0–10.5)

## 2022-11-13 LAB — BASIC METABOLIC PANEL
BUN: 24 mg/dL — ABNORMAL HIGH (ref 6–23)
CO2: 24 mEq/L (ref 19–32)
Calcium: 9 mg/dL (ref 8.4–10.5)
Chloride: 100 mEq/L (ref 96–112)
Creatinine, Ser: 1.15 mg/dL (ref 0.40–1.50)
GFR: 69.2 mL/min (ref >60.00)
Glucose, Bld: 159 mg/dL — ABNORMAL HIGH (ref 70–99)
Potassium: 4.7 mEq/L (ref 3.5–5.1)
Sodium: 134 mEq/L — ABNORMAL LOW (ref 135–145)

## 2022-11-14 DIAGNOSIS — J189 Pneumonia, unspecified organism: Secondary | ICD-10-CM | POA: Insufficient documentation

## 2022-11-14 MED FILL — LEVOTHYROXINE 175 MCG TABLET: ORAL | 90 days supply | Qty: 90 | Fill #1

## 2022-11-14 MED FILL — TACROLIMUS 1 MG CAPSULE, IMMEDIATE-RELEASE: ORAL | 30 days supply | Qty: 180 | Fill #1

## 2022-11-14 MED FILL — FREESTYLE LIBRE 3 PLUS SENSOR DEVICE: 28 days supply | Qty: 2 | Fill #0

## 2022-11-14 NOTE — Assessment & Plan Note (Signed)
  Would avoid levaquin given current meds.  Discussed with patient.  Start Augmentin and doxycycline.  Routine cautions given to patient.  Repeat CXR in about 2-3 weeks here at [facility].  Use albuterol if needed.  Update [place] in a few days.   If worse, then go to the ER.

## 2022-11-16 DIAGNOSIS — Z944 Liver transplant status: Principal | ICD-10-CM

## 2022-11-16 DIAGNOSIS — E612 Magnesium deficiency: Principal | ICD-10-CM

## 2022-11-16 DIAGNOSIS — Z5181 Encounter for therapeutic drug level monitoring: Principal | ICD-10-CM

## 2022-11-16 NOTE — Unmapped (Signed)
Patient called today, saying he saw his pcp b/c his pna has not resolved, and he is still coughing. They started him on Augmentin 875-125 bid x 7 days AND Doxycycline 100mg  bid x 7 days, and he is now tapered down to 5mg  daily pred. He shared that he never refilled his dapsone,which was started inpatient for PJP ppx while his pred was tapered, and he has been without it for a few weeks. Explained that now that he is saying his pred dose has been tapered down to 5mg , he no longer needs dapsone, but should take the abx prescribed by his pcp. He continues to report that all his diarrhea has resolved. Suggested he repeat labs again in the next week, to assure his Tac level is therapeutic, since it appeared to be running above goal while inpatient. He verbalized understanding.

## 2022-11-18 ENCOUNTER — Telehealth: Payer: Self-pay | Admitting: Family Medicine

## 2022-11-18 ENCOUNTER — Telehealth: Admit: 2022-11-18 | Discharge: 2022-11-19 | Payer: MEDICARE | Attending: Ambulatory Care | Primary: Ambulatory Care

## 2022-11-18 DIAGNOSIS — I2510 Atherosclerotic heart disease of native coronary artery without angina pectoris: Secondary | ICD-10-CM | POA: Diagnosis not present

## 2022-11-18 DIAGNOSIS — Z7984 Long term (current) use of oral hypoglycemic drugs: Secondary | ICD-10-CM | POA: Diagnosis not present

## 2022-11-18 DIAGNOSIS — E1165 Type 2 diabetes mellitus with hyperglycemia: Secondary | ICD-10-CM | POA: Diagnosis not present

## 2022-11-18 DIAGNOSIS — K760 Fatty (change of) liver, not elsewhere classified: Secondary | ICD-10-CM | POA: Diagnosis not present

## 2022-11-18 DIAGNOSIS — Z794 Long term (current) use of insulin: Secondary | ICD-10-CM | POA: Diagnosis not present

## 2022-11-18 DIAGNOSIS — Z951 Presence of aortocoronary bypass graft: Secondary | ICD-10-CM | POA: Diagnosis not present

## 2022-11-18 DIAGNOSIS — Z7982 Long term (current) use of aspirin: Secondary | ICD-10-CM | POA: Diagnosis not present

## 2022-11-18 DIAGNOSIS — Z79890 Hormone replacement therapy (postmenopausal): Secondary | ICD-10-CM | POA: Diagnosis not present

## 2022-11-18 DIAGNOSIS — E039 Hypothyroidism, unspecified: Secondary | ICD-10-CM | POA: Diagnosis not present

## 2022-11-18 DIAGNOSIS — Z944 Liver transplant status: Secondary | ICD-10-CM | POA: Diagnosis not present

## 2022-11-18 DIAGNOSIS — I10 Essential (primary) hypertension: Secondary | ICD-10-CM | POA: Diagnosis not present

## 2022-11-18 NOTE — Telephone Encounter (Signed)
Pt called back returning Jessica's phone call. Told pt Duncan's response regarding results. Pt stated he feels the same, believes he's caught a cold now. Pt states he feels fine with doxycycline (VIBRA-TABS) 100 MG tablet, no comments on meds. Pt had no questions/concerns. Call back # [phone]

## 2022-11-18 NOTE — Unmapped (Incomplete)
[facility] at [facility]  [address]  [place], [place] [address]    Clinical Pharmacist Visit Summary    The patient reports they are physically located in [place] and is currently: at home. I conducted a audio/video visit. I spent  41m 30s on the video call with the patient. I spent an additional 30 minutes on pre- and post-visit activities on the date of service .     Assessment and Plan:   1. Diabetes, post-transplant: fair control.  Last A1c = 7.4% on 09/15/2022 with goal <7% without hypoglycemia.   CGM data indicates poor control with significant variability.  ABG: 235 mg/dL; GMI 6.0%; TIR 45%, very high 39%, high 26%, low and very low 0%. Most BG excursions appear to occur between 3-9 PM.  Endorses readings of <70 mg/dL in the middle of the night, but no symptoms of hypoglycemia. Recommended pt check lows with glucometer to evaluate if they are true vs. compression lows.  Takes Jardiance 25 mg daily, pioglitazone 30 mg daily, NPH 20 units daily and Humalog per sliding scale TID with good adherence. However endorses occasionally missing a Humalog dose, particularly around dinner.  Denies adverse effects or affordability concerns (gets branded medications via [facility]). Denies regular physical activity; diet is mostly unchanged.   Based on CGM data, pt is persistently high throughout the late morning/afternoon indicating need for further glycemic control. Given NPH's shorter half life, it is likely not contributing to overnight lows (whether true vs. Compression lows) and with shared decision making, will increase NPH insulin by 20% to 24 units daily. Instructed pt to reduce NPH back to 20 units if he experiences true lows. Emphasized importance of adherence to Humalog, especially around dinner time doses, but will continue with same sliding scale regimen today. I will follow up with pt via phone call on Monday 11/23/22 @8 :30-9 AM to check in after recent decrease in steroid taper and plans for after full discontinuation of prednisone. Continue Jardiance as prescribed and pioglitazone as prescribed.   Future considerations: could consider adding a DPP4i (Tradjenta) through [facility] where he is already receiving Jardiance; would not have CV benefit with DPP4i (compared with GLP-1 RA), which is less than ideal given his history of CAD.   INCREASE Insulin NPH to 24 units once daily  Continue Humalog per sliding scale below   Breakfast         Lunch              Dinner  Below 150:      10                    14                    14   151-200:          12                    16                    16   201-250:          14                    18                    18   >251:               14  20                    20  Continue Jardiance 25 mg PO daily.  Continue pioglitazone 30 mg PO daily  Patient to consider adding Tradjenta (via [facility], no renal dose adjustments) vs. insulin prior to next Endo visit.   Reviewed signs/symptoms/treatment of hypoglycemia.   Follow up with clinical pharmacist as needed for additional management.  Follow up with Dr. Beryle Flock MD on 10/19/2022.     Kari Baars, PharmD  PGY-1 Ambulatory Care Pharmacy Resident     I spent a total of 35 minutes on audio-video with the patient delivering clinical care and providing education/counseling.     Subjective:   Reason for visit: Follow up with Clinical Pharmacist Practitioner for blood glucose review and adjustment of diabetes regimen as needed.    Anthony Alvarado is a 62 y.o. year old male with a history of diabetes post-transplant who presents today for a diabetes-related visit. PMH includes liver transplant (2022), hepatic steatosis, obesity, hypothyroidism, and HTN, CAD s/p CABG (2023)    Diabetes Provider and Last Visit Date: Dr, Beryle Flock, MD on 11/09/22    At Last Visit:   Pt admitted 9/6-9/11 for steroid induced hyperglycemia and PNA. Currently on steroid taper and scheduled to end 11/25/22. At last visit with Dr. Dell Ponto:  Continue taking the Jardiance 25 mg daily  Increase Pioglitazone to 30 mg once a day.   Continue to hold the Mteformin while still having some GI effects from colitis  Increase NPH to 26 units in AM  Increase humalog according to a scale (see current medications), printed out for pt.  Continue Freestyle Libre 3  He knows that this may need to be adjusted as he is coming down on his steroid taper. If having lows, will cut down and let me know    Interval History of Present Illness:     Today: Patient presents virtually for diabetes follow up. He reports adherence to NPH (though titrated from 26 to 20 units with steroid taper), Humalog per sliding scale with breakfast, lunch and diner, Jardiance 25 mg daily and pioglitazone 30 mg daily. Pt reports occasionally missing dinner time Humalog dose, but good adherence to other medications. Patient notes that his previous symptoms of diarrhea have resolved since discharge from the hospital and since stopping Ozempic and metformin. Pt states he is still feeling under the weather with his PNA and steroid course but was recently prescribed more abx - managed by PCP.    Denies affordability concerns (has [employer], getting Jardiance through [facility]). Continues to use Freestyle Libre 3 at home without issue. Denies symptoms hypoglycemia, but has awoken from alarms indicating BG <70 mg/dl. Diet and exercise as documented below.  Discussed the importance of medication adherence, diet, and exercise to improve glycemic control. He had no additional questions or concerns.    Current Medications  Jardiance 25 mg daily  Pioglitazone 30 mg daily  Insulin NPH 20 units once daily in the morning (7:30-8 AM)  Humalog per sliding scale listed below                          Breakfast         Lunch              Dinner  Below 150:      10  14                    14  151-200:          12                    16                    16   201-250:          14 18                    18   >251:               14                    20                    20     Previous Medications: Reports adherence to the following diabetes medications:   Ozempic 0.25 mg once weekly - stopped on 08/01/2022 (GI intolerance)  Metformin XR 1000 mg BID  - stopped on 09/02/2022 (GI intolerance)    Diet (Typical):  Breakfast (8AM): Cereal, eggs and sausage and bacon  Lunch (11-12) : Fast food (Hamburgers/fries), grilled cheese sandwich   Dinner (4-6 PM): Vegetables (lima/pinto beans/corn/ turnip greens), spaghetti, brown rice, tacos, hotdogs  Snacks (usually at night): Pretzels, chips, chocolate chip cookies. Cheeze-its. Ice cream  Beverages: Water, diet Pepsi, regular Gatorade   11/18/22: Pt reports eating less fast food since last visit but minimal changes overall. Prednisone is making him feel more hungry    Exercise: Denies regular physical activity, pt has been sick all throughout the summer and has SOB d/t PNA currently. Would like to start walking more often once he feels better.    Social History:   Tobacco use: no  Illicit drug use use: no  Alcohol use: no  Occupation: Unemployed  Household: Lives alone    POC glucose level today of N/A mg/dL - virtual visit.     Glucose Monitoring: Freestyle Libre CGM .       Hypoglycemia:    Symptoms of hypoglycemia since last visit: no , but endorses readings of <70 mg/dL at night  If no recent hypoglycemia, prior recognition of hypoglycemia symptoms and knowledge of treatment: yes   Treats with:  glucose tablets and hard candy    CARDIOVASCULAR RISK REDUCTION  History of clinical ASCVD? yes, CAD s/p CABG 2023  History of heart failure? no  History of hyperlipidemia? yes  Taking statin? yes, pravastatin 40 mg daily  Taking aspirin? yes  Taking SGLT-2i and/or GLP- 1 RA? yes, Jardiance 25 mg daily    BLOOD PRESSURE CONTROL  History of hypertension?: yes  Taking ACEi/ARB? no    KIDNEY CARE  History of Chronic Kidney Disease? no  History of albuminuria? no  Taking SGLT-2i and/or GLP- 1 RA? As above  Taking ACEi/ARB? As above      Current Outpatient Medications:     aspirin 325 MG tablet, Take 1 tablet (325 mg total) by mouth daily., Disp: , Rfl:     azelastine (ASTELIN) 137 mcg (0.1 %) nasal spray, 1 spray into each nostril daily as needed., Disp: , Rfl:     blood sugar diagnostic (ONETOUCH ULTRA TEST) Strp, USE TO CHECK SUGAR DAILY, Disp: , Rfl:     blood-glucose sensor (FREESTYLE LIBRE 3 SENSOR) Devi, For use as directed. change  every 14 days., Disp: 3 each, Rfl: 11    calcium carbonate (TUMS) 200 mg calcium (500 mg) chewable tablet, Chew 1 tablet (500 mg total) daily. Pt reports taking every other day., Disp: , Rfl:     dapsone 100 MG tablet, Take 1 tablet (100 mg total) by mouth daily., Disp: 30 tablet, Rfl: 2    empagliflozin (JARDIANCE) 25 mg tablet, Take 1 tablet (25 mg total) by mouth daily., Disp: , Rfl:     fluticasone propionate (FLONASE) 50 mcg/actuation nasal spray, PLACE ONE OR TWO SPRAYS INTO BOTH NOSTRILS DAILY AS NEEDED., Disp: , Rfl:     glucose 4 GM chewable tablet, Chew 4 tablets (16 g total) every ten (10) minutes as needed for low blood sugar ((For Blood Glucose LESS than 70 mg/dL and GREATER than or EQUAL to 54 mg/dL and able to take by mouth.))., Disp: 50 tablet, Rfl: 12    glycerin/witch hazel leaf (HEMORRHOID TOP), Apply topically., Disp: , Rfl:     hydrocortisone 1 % cream, Apply topically to hemorrhoids up to twice a day, Disp: 30 g, Rfl: 0    insulin lispro (HUMALOG) 100 unit/mL injection pen, Inject 10 Units under the skin Three (3) times a day before meals., Disp: 15 mL, Rfl: 0    insulin NPH isoph U-100 human (HUMULIN) 100 unit/mL (3 mL) injection pen, Inject 20 Units under the skin daily. Take with first meal of the day, Disp: 15 mL, Rfl: 0    levothyroxine (SYNTHROID) 175 MCG tablet, Take 1 tablet (175 mcg total) by mouth daily before breakfast., Disp: 90 tablet, Rfl: 1    metoPROLOL tartrate (LOPRESSOR) 25 MG tablet, Take 1 tablet (25 mg total) by mouth two (2) times a day., Disp: 180 tablet, Rfl: 1    pen needle, diabetic 32 gauge x 5/32 (4 mm) Ndle, Use with insulin up to 4 times/day as needed., Disp: 100 each, Rfl: 1    pioglitazone (ACTOS) 30 MG tablet, Take 1 tablet (30 mg total) by mouth daily., Disp: 90 tablet, Rfl: 3    pravastatin (PRAVACHOL) 40 MG tablet, Take 1 tablet (40 mg total) by mouth every evening., Disp: 30 tablet, Rfl: 1    predniSONE (DELTASONE) 5 MG tablet, Take 4 tablets (20 mg total) by mouth daily for 4 days, THEN 3 tablets (15 mg total) daily for 5 days, THEN 2 tablets (10 mg total) daily for 5 days, THEN 1 tablet (5 mg total) daily for 5 days, THEN 1 tablet (5 mg total) every other day for 3 doses then stop., Disp: 49 tablet, Rfl: 0    sertraline (ZOLOFT) 50 MG tablet, Take 1 tablet (50 mg total) by mouth daily., Disp: 90 tablet, Rfl: 3    tacrolimus (PROGRAF) 1 MG capsule, Take 3 capsules (3 mg total) by mouth two (2) times a day., Disp: 180 capsule, Rfl: 11  No current facility-administered medications for this visit.    Facility-Administered Medications Ordered in Other Visits:     diazePAM (VALIUM) 5 mg/mL injection, , , ,     Objective:   Vitals:    There were no vitals filed for this visit.      Past Medical History:    Active Ambulatory Problems     Diagnosis Date Noted    Diabetes mellitus without complication (CMS-HCC) 05/09/2017    Hypothyroidism 08/09/2006    Cirrhosis of liver without ascites (CMS-HCC) 01/10/2018    OSA (obstructive sleep apnea) 09/14/2011    Essential hypertension 08/09/2006  Encounter for pre-transplant evaluation for chronic liver disease 03/10/2018    Pleural effusion 09/09/2018    Pneumothorax after biopsy 09/11/2018    Liver transplant recipient (CMS-HCC) 09/16/2018    Allergic rhinitis 08/09/2006    Arthralgia 10/25/2017    Asthma 09/14/2011    Bicuspid aortic valve 02/25/2011    Flying phobia 09/18/2013    Mucosal abnormality of stomach 09/27/2018 Nephrolithiasis 07/28/2018    Pure hypercholesterolemia 02/14/2007    Umbilical hernia 08/14/2012    Unspecified hearing loss 08/11/2006    COVID-19 03/16/2019    Colitis 09/27/2022    Hemorrhoids, external 10/14/2022    Steatosis of liver 10/19/2022    Steroid-induced hyperglycemia 10/31/2022    Leukocytosis 10/31/2022    Hyponatremia with increased serum osmolality 10/31/2022     Resolved Ambulatory Problems     Diagnosis Date Noted    No Resolved Ambulatory Problems     Past Medical History:   Diagnosis Date    Aortic valve stenosis     Autoimmune cholangitis     Carrier of hemochromatosis HFE gene mutation     Cholestatic cirrhosis (CMS-HCC)     Hepatic encephalopathy (CMS-HCC)     Hypertension     Other osteoporosis without current pathological fracture     Sleep apnea     Type 2 diabetes mellitus (CMS-HCC)        Wt Readings from Last 3 Encounters:   11/09/22 78.9 kg (174 lb)   10/31/22 74.8 kg (165 lb)   10/19/22 76.7 kg (169 lb 3.2 oz)       Lab Results   Component Value Date    A1C 7.4 (H) 09/15/2022    A1C 7.4 (H) 04/06/2022    A1C 6.6 (H) 09/18/2019    A1C 6.0 (H) 04/17/2019    A1C 5.9 (H) 12/19/2018    A1C 4.7 (L) 10/24/2018    A1C 4.4 (L) 09/15/2018    A1C 5.1 01/04/2018       Lab Results   Component Value Date    NA 139 11/04/2022    K 4.0 11/04/2022    CL 107 11/04/2022    CO2 29.0 11/04/2022    BUN 15 11/04/2022    CREATININE 0.80 11/04/2022    GLU 154 11/04/2022    CALCIUM 9.0 11/04/2022    ALBUMIN 2.7 (L) 11/02/2022    PHOS 3.6 11/04/2022       Lab Results   Component Value Date    ALKPHOS 79 11/02/2022    BILITOT 0.2 (L) 11/02/2022    BILIDIR <0.10 11/02/2022    PROT 6.2 11/02/2022    ALBUMIN 2.7 (L) 11/02/2022    ALT 13 11/02/2022    AST 9 11/02/2022       No results found for: [facility]    Lab Results   Component Value Date    CHOL 122 04/17/2019    CHOL 114 12/19/2018     Lab Results   Component Value Date    HDL 33 (L) 04/17/2019    HDL 33 (L) 12/19/2018     Lab Results   Component Value Date    LDL 52 (L) 04/17/2019    LDL 32 (L) 12/19/2018    LDL 79.6 03/21/2018     Lab Results   Component Value Date    VLDL 37 04/17/2019    VLDL 24.4 (H) 12/19/2018     Lab Results   Component Value Date    CHOLHDLRATIO 3.7 04/17/2019    CHOLHDLRATIO 3.5 12/19/2018  Lab Results   Component Value Date    TRIG 185 (H) 04/17/2019    TRIG 243 (H) 12/19/2018       The 10-year ASCVD risk score (Arnett DK, et al., 2019) is: 17.4%    Values used to calculate the score:      Age: 62 years      Sex: Male      Is Non-Hispanic African American: No      Diabetic: Yes      Tobacco smoker: No      Systolic Blood Pressure: 131 mmHg      Is BP treated: Yes      HDL Cholesterol: 28 mg/dL      Total Cholesterol: 113 mg/dL    Note: For patients with SBP <90 or >200, Total Cholesterol <130 or >320, HDL <20 or >100 which are outside of the allowable range, the calculator will use these upper or lower values to calculate the patient???s risk score.

## 2022-11-18 NOTE — Unmapped (Signed)
Freestyle Libre linked to clinic, report downloaded into Careers information officer.

## 2022-11-18 NOTE — Telephone Encounter (Signed)
Result note updated and sent to Dr. Para March.

## 2022-11-19 NOTE — Unmapped (Addendum)
 Anthony Alvarado  NEW CONSULTATION      Reason for Consult: Follow of cellcept induced colitis with underlying liver transplant    Referring Physician: [name]  11/21/2022    Assessment and Plan:  Anthony Alvarado is a 62 y.o. male with cryptogenic cirrhosis s/p LT on tacro and MMF c/b drug-induced (mycophenolate) colitis, T2DM who recently presented to the hospital in the setting of hyperglycemia likely induced by steroids and CAP currently completing antibiotic treatment.     MMF-Induced Colitis  Patient initially presented as a transfer to [facility] from [facility] health in the setting of potential new onset IBD vs cellcept induced colitis. He had persistent and chronic diarrhea with blood in the stool for several months prior. Abdominal imaging was concerning for colitis and was treated with antibiotics but persisted. He underwent colonoscopy on 7/17 which showed severe mucosal changes throughout the colon and pathology concerned for active colitis with ulceration. He presented to [facility] where a Flex sig was completed which showed similar findings and pathology with acute active colitis.     He underwent IV steroids and IV Infliximab 10mg /kg (09/26/22). Bloody diarrhea and abdominal symptoms improved. Ulitmately decided that cellcept was the cause and this was held. He continued to have hematochezia and underwent repeat flex sigmoidoscopy 8/9 with hemorrhoids but otherwise healed colonic mucosa. He represented to the hospital with uncontrolled type 2 DM in the setting of steroid-induced hyperglycemia and CAP. His steroid taper was abbreviated, which he is completing now. Given his last colonoscopy for CRC screening was in 2014 he really hasn't had a screening colon.     External Hemorrhoids  Patient has followed up with surgery and has been started on external topicals with [name] hemorrhoid compound along with lifestyle modification measures. He feels this is adequately taken care of. Cryptogenic Cirrhosis S/p OLT (2020) Hx of HCC  Follows with [facility]. Will ensure he has a follow up appt. On tacrolimus. Follows with Dr. Sherryll Burger and Priscille Heidelberg.    Plan:  - Follow up CRC screening colonoscopy  - Continue to hold Cellcept, defer immunosuppression to transplant team, monitor for recurrence of symptoms of hematochezia  - Complete steroid taper   - Continued follow up with transplant Alvarado   - Continue to follow with surgery for hemorrhoids    This patient was staffed with Dr. Maricela Bo.    Return in about 1 year (around 11/20/2023) for Recheck symptoms of colitis.    Kathee Delton Ollen Gross, MD  Gastroenterology & Alvarado Fellow  [facility] [place], [place]    I saw and evaluated Anthony Alvarado with GI fellow Dr. Ollen Gross. I participated in key portions of the service and personally reviewed the plan of care with the patient.  I reviewed the fellow's note and agree with the fellow???s findings and plan.     Misty Stanley MD  Professor of Medicine  Division of Gastroenterology & Alvarado  [place] [facility]    __________________________________________________  Chief Complaint: Follow up for cellcept induced colitis    History of Presenting Illness:   Patient seen in consultation at the request of Dr. [name] for follow up of cellcept induced colitis    Patient has felt significantly improved as far as his diarrhea is concerned. He now has 1-2 bowel movements with no blood in the stool. He does not have nausea or vomiting. Initially when he had prolonged symptoms he lost a significant  amount of weight but now his appetite has returned and he has gained it back. He did have a repeat hospital presentation because of cough and shortness of breath found to have pneumonia and hyperglycemia in the setting of his T2DM likely from steroids. He is still taking PO abx to complete his PNA treatment. He continues to have some cough and mild SOB but sugars have been under better control since seeing endocrine inpatient and outpatient. He does feel some mild side effects to prednisone feeling a little woozy    The following portions of the patient's history were reviewed and updated as appropriate: allergies, current medications, past family history, past medical history, past social history, past surgical history and problem list.    Review of Systems:  The balance of 12 systems reviewed is negative except as noted in the HPI.   __________________________________________________  Problem List:  Patient Active Problem List   Diagnosis    Diabetes mellitus without complication (CMS-HCC)    Hypothyroidism    Cirrhosis of liver without ascites (CMS-HCC)    OSA (obstructive sleep apnea)    Essential hypertension    Encounter for pre-transplant evaluation for chronic liver disease    Pleural effusion    Pneumothorax after biopsy    Liver transplant recipient (CMS-HCC)    Allergic rhinitis    Arthralgia    Asthma    Bicuspid aortic valve    Flying phobia    Mucosal abnormality of stomach    Nephrolithiasis    Pure hypercholesterolemia    Umbilical hernia    Unspecified hearing loss    COVID-19    Colitis    Hemorrhoids, external    Steatosis of liver    Steroid-induced hyperglycemia    Leukocytosis    Hyponatremia with increased serum osmolality       Past Medical History  Past Medical History:   Diagnosis Date    Aortic valve stenosis     Autoimmune cholangitis     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 Pertinent findings include:  Flexible sigmoidoscopy 10/02/22: Impression:            - External hemorrhoids found on perianal exam.                         - The rectum, sigmoid colon and recto-sigmoid colon                          were otherwise normal.                         - Stool in the sigmoid colon.                         - No specimens collected.  Flexible Sigmoidoscopy 08/2022:   Findings:  Diffuse inflammation characterized by congestion (edema), granularity,   mucus and shallow ulcerations was found in the rectum, in the sigmoid   colon, in the descending colon and in the transverse colon. Biopsies   were taken  with a cold forceps for histology.  Hemorrhoids were found on perianal exam.  Impression: - Diffuse inflammation was found in the rectum, in   the sigmoid colon, in the descending colon and in   the transverse colon. Biopsied.  - Hemorrhoids found on perianal exam.     EGD 08/2022:  Impression: - Normal esophagus.  - Gastritis with hematin. Biopsied.  - Duodenitis. Biopsied.  Recommendation: - Await pathology results.     Colonoscopy 09/09/22:  Impression:            - Prolapsed hemorrhoids.                         - Severe mucosal changes were found in the entire                          examined colon, rule out inflammatory bowel disease                          and rule out infectious colitis. Biopsied.                         - The examined portion of the ileum was normal.    Pathology:  09/09/2022 colonoscopy: A: Colon, biopsy:  -Severely active colitis with ulceration.    09/22/22:   A. DUODENUM, BIOPSY:   - Benign small bowel mucosa with intact villous architecture, gastric   heterotopia and peptic duodenitis     B. STOMACH, BIOPSY:   - Mild chronic gastritis.   - Negative for H. pylori on HE and immunohistochemical stain   - No intestinal metaplasia, dysplasia, or malignancy.     C. COLON, BIOPSY:   - Benign colonic mucosa with nonspecific chronic and moderately active   colitis   - Negative for features of chronicity, granulomas or viral cytopathic   effect   - Negative for CMV on immunohistochemical stain   - Negative for dysplasia or malignancy     Radiographic studies: Reviewed. Pertinent findings include:  CTA AP 09/21/2022: IMPRESSION:   1. Evidence of ongoing widespread Colitis since the CT last month,   most pronounced in the sigmoid colon.   New small volume of simple density free fluid in the pelvis, likely   reactive. No evidence of bowel perforation or obstruction.   Negative for active GI bleeding by CTA.   2. No other acute or inflammatory process identified.     Medications  Medications were reviewed with the patient and include:    Current Outpatient Medications:     amoxicillin-clavulanate (AUGMENTIN) 875-125 mg per tablet, Take 1 mg by mouth Two (2) times a day (at 8am and 12:00)., Disp: , Rfl:     aspirin 325 MG tablet, Take 1 tablet (325 mg total) by mouth daily., Disp: , Rfl:     azelastine (ASTELIN) 137 mcg (0.1 %) nasal spray, 1 spray into each nostril daily as needed., Disp: , Rfl:     blood sugar diagnostic (ONETOUCH ULTRA TEST) Strp, USE TO CHECK SUGAR DAILY, Disp: , Rfl:     blood-glucose sensor (FREESTYLE LIBRE 3 SENSOR) Devi, For use as directed. change every 14 days., Disp: 3 each, Rfl: 11    calcium carbonate (TUMS) 200 mg calcium (500 mg) chewable tablet, Chew 1 tablet (500 mg total) daily. Pt reports taking every other day., Disp: , Rfl:  dapsone 100 MG tablet, Take 1 tablet (100 mg total) by mouth daily., Disp: 30 tablet, Rfl: 2    doxycycline (VIBRA-TABS) 100 MG tablet, Take 1 tablet (100 mg total) by mouth., Disp: , Rfl:     empagliflozin (JARDIANCE) 25 mg tablet, Take 1 tablet (25 mg total) by mouth daily., Disp: , Rfl:     fluticasone propionate (FLONASE) 50 mcg/actuation nasal spray, PLACE ONE OR TWO SPRAYS INTO BOTH NOSTRILS DAILY AS NEEDED., Disp: , Rfl:     glucose 4 GM chewable tablet, Chew 4 tablets (16 g total) every ten (10) minutes as needed for low blood sugar ((For Blood Glucose LESS than 70 mg/dL and GREATER than or EQUAL to 54 mg/dL and able to take by mouth.))., Disp: 50 tablet, Rfl: 12    glycerin/witch hazel leaf (HEMORRHOID TOP), Apply topically., Disp: , Rfl:     hydrocortisone 1 % crpe, APPLY TO AFFECTED AREA TWICE A DAY, Disp: , Rfl:     insulin lispro (HUMALOG) 100 unit/mL injection pen, Inject 10 Units under the skin Three (3) times a day before meals., Disp: 15 mL, Rfl: 0    insulin lispro (HUMALOG) 100 unit/mL injection pen, Inject 14-20 Units under the skin., Disp: , Rfl:     insulin NPH isoph U-100 human (HUMULIN) 100 unit/mL (3 mL) injection pen, Inject 20 Units under the skin daily. Take with first meal of the day, Disp: 15 mL, Rfl: 0    levothyroxine (SYNTHROID) 175 MCG tablet, Take 1 tablet (175 mcg total) by mouth daily before breakfast., Disp: 90 tablet, Rfl: 1    metoPROLOL tartrate (LOPRESSOR) 25 MG tablet, Take 1 tablet (25 mg total) by mouth two (2) times a day., Disp: 180 tablet, Rfl: 1    pen needle, diabetic 32 gauge x 5/32 (4 mm) Ndle, Use with insulin up to 4 times/day as needed., Disp: 100 each, Rfl: 1    pioglitazone (ACTOS) 30 MG tablet, Take 1 tablet (30 mg total) by mouth daily., Disp: 90 tablet, Rfl: 3    pravastatin (PRAVACHOL) 40 MG tablet, Take 1 tablet (40 mg total) by mouth every evening., Disp: 30 tablet, Rfl: 1    predniSONE (DELTASONE) 5 MG tablet, Take 4 tablets (20 mg total) by mouth daily for 4 days, THEN 3 tablets (15 mg total) daily for 5 days, THEN 2 tablets (10 mg total) daily for 5 days, THEN 1 tablet (5 mg total) daily for 5 days, THEN 1 tablet (5 mg total) every other day for 3 doses then stop., Disp: 49 tablet, Rfl: 0    sertraline (ZOLOFT) 50 MG tablet, Take 1 tablet (50 mg total) by mouth daily., Disp: 90 tablet, Rfl: 3    tacrolimus (PROGRAF) 1 MG capsule, Take 3 capsules (3 mg total) by mouth two (2) times a day., Disp: 180 capsule, Rfl: 11  No current facility-administered medications for this visit.    Facility-Administered Medications Ordered in Other Visits:     diazePAM (VALIUM) 5 mg/mL injection, , , ,     Family History  Family History   Problem Relation Age of Onset Asthma Mother     Hearing loss Mother     COPD Father     Cancer Father     Autoimmune disease Sister         AIH    Heart disease Sister     Liver disease Sister     Thyroid disease Sister     Thyroid disease Sister  Colon cancer Maternal Grandfather     Melanoma Neg Hx     Basal cell carcinoma Neg Hx     Squamous cell carcinoma Neg Hx        Social History  Social History     Tobacco Use    Smoking status: Never    Smokeless tobacco: Never   Substance Use Topics    Alcohol use: Not Currently       Allergies  Allergies   Allergen Reactions    Mycophenolate Mofetil Diarrhea     Mycophenolate colitis     Watermelon Flavor      Mouth itching       Vitals:   Vitals:    11/20/22 0856   BP: 146/75   Pulse: 72     Wt Readings from Last 10 Encounters:   11/20/22 80.7 kg (178 lb)   11/09/22 78.9 kg (174 lb)   10/31/22 74.8 kg (165 lb)   10/19/22 76.7 kg (169 lb 3.2 oz)   10/14/22 75.5 kg (166 lb 8 oz)   10/02/22 69.9 kg (154 lb)   09/15/22 69.9 kg (154 lb)   05/12/22 93.2 kg (205 lb 6.4 oz)   04/06/22 93.9 kg (207 lb)   08/28/21 92.4 kg (203 lb 9.6 oz)       Physical Exam:   General appearance: Appears well, no distress.  HEENT: Extraocular movement intact  Cardiovascular: Regular rate and No murmurs, rubs or gallops  Pulmonary:  Inspiratory crackles heard over the RL lung base  Abdominal: soft, nontender, nondistended, no masses or organomegaly.  Musculoskeletal:  Normal joints of the hand.  Skin: No jaundice. No rashes.  Neurologic:  Alert, oriented, and appropriate.  Psychiatric: Appropriate.    Labs:   Outside labs reviewed. Pertinent findings include:  Lab Results   Component Value Date    ALKPHOS 89 11/20/2022    BILITOT 0.3 11/20/2022    BILIDIR <0.10 11/20/2022    PROT 7.6 11/20/2022    ALBUMIN 3.7 11/20/2022    ALT 16 11/20/2022    AST 18 11/20/2022     Lab Results   Component Value Date    PT 13.0 (H) 10/31/2022    PT 12.1 09/27/2022    PT 14.7 08/16/2022    INR 1.17 10/31/2022    INR 1.08 09/27/2022    INR 1.10 08/16/2022     Lab Results   Component Value Date    WBC 9.1 11/20/2022    RBC 3.79 (L) 11/20/2022    HGB 11.3 (L) 11/20/2022    HCT 34.9 (L) 11/20/2022    MCV 91.9 11/20/2022    MCH 29.8 11/20/2022    MCHC 32.4 11/20/2022    RDW 17.6 (H) 11/20/2022    PLT 273 11/20/2022    MPV 6.6 (L) 11/20/2022       Lab Results   Component Value Date/Time    CRP 22.0 (H) 10/03/2022 08:52 AM    CRP 9.0 10/02/2022 08:12 AM    CRP 8.0 10/01/2022 08:32 AM    WBC 9.1 11/20/2022 09:52 AM    WBC 12.7 (H) 11/01/2022 12:46 PM    WBC 11.1 10/31/2022 05:32 AM    WBC 17.0 (H) 10/30/2022 10:15 AM    WBC 8.5 10/21/2022 09:07 AM    WBC 8.9 10/16/2022 10:55 AM    AST 18 11/20/2022 09:52 AM    AST 9 11/02/2022 06:15 AM    AST 16 11/01/2022 12:46 PM    AST 9 10/30/2022 10:15  AM    AST 16 10/21/2022 09:07 AM    AST 15 10/08/2022 09:20 AM    ALT 16 11/20/2022 09:52 AM    ALT 13 11/02/2022 06:15 AM    ALT 16 11/01/2022 12:46 PM    ALT 16 10/30/2022 10:15 AM    ALT 28 10/21/2022 09:07 AM    ALT 26 10/08/2022 09:20 AM    ALB 3.7 (L) 10/30/2022 10:15 AM    ALB 4.0 10/21/2022 09:07 AM    ALB 3.5 (L) 10/08/2022 09:20 AM    PT 13.0 (H) 10/31/2022 05:32 AM    PT 12.1 09/27/2022 12:02 PM    PT 14.7 08/16/2022 06:03 PM    INR 1.17 10/31/2022 05:32 AM    INR 1.08 09/27/2022 12:02 PM    INR 1.10 08/16/2022 06:03 PM    INR 1.7 (H) 06/20/2018 10:26 AM    INR 1.5 (H) 06/02/2018 09:56 AM    INR 1.5 (H) 04/14/2018 09:58 AM

## 2022-11-19 NOTE — Unmapped (Signed)
Pre called and triaged. Confirmed appointment time ,location and details

## 2022-11-19 NOTE — Unmapped (Signed)
I was immediately available via phone/pager or present on site.  I reviewed and discussed the case with the fellow, but did not see the patient.  I agree with the assessment and plan as documented in the fellow's note. Trystian Crisanto Jin Avish Torry, MD

## 2022-11-20 ENCOUNTER — Ambulatory Visit: Admit: 2022-11-20 | Discharge: 2022-11-21 | Payer: MEDICARE

## 2022-11-20 DIAGNOSIS — Z8719 Personal history of other diseases of the digestive system: Secondary | ICD-10-CM | POA: Diagnosis not present

## 2022-11-20 DIAGNOSIS — C220 Liver cell carcinoma: Secondary | ICD-10-CM | POA: Diagnosis not present

## 2022-11-20 DIAGNOSIS — E612 Magnesium deficiency: Secondary | ICD-10-CM | POA: Diagnosis not present

## 2022-11-20 DIAGNOSIS — K521 Toxic gastroenteritis and colitis: Secondary | ICD-10-CM | POA: Diagnosis not present

## 2022-11-20 DIAGNOSIS — Z5181 Encounter for therapeutic drug level monitoring: Secondary | ICD-10-CM | POA: Diagnosis not present

## 2022-11-20 DIAGNOSIS — T451X5D Adverse effect of antineoplastic and immunosuppressive drugs, subsequent encounter: Secondary | ICD-10-CM | POA: Diagnosis not present

## 2022-11-20 DIAGNOSIS — Z09 Encounter for follow-up examination after completed treatment for conditions other than malignant neoplasm: Secondary | ICD-10-CM | POA: Diagnosis not present

## 2022-11-20 DIAGNOSIS — Z944 Liver transplant status: Secondary | ICD-10-CM | POA: Diagnosis not present

## 2022-11-20 LAB — COMPREHENSIVE METABOLIC PANEL
ALBUMIN: 3.7 g/dL (ref 3.4–5.0)
ALKALINE PHOSPHATASE: 89 U/L (ref 46–116)
ALT (SGPT): 16 U/L (ref 10–49)
ANION GAP: 7 mmol/L (ref 5–14)
AST (SGOT): 18 U/L (ref <=34)
BILIRUBIN TOTAL: 0.3 mg/dL (ref 0.3–1.2)
BLOOD UREA NITROGEN: 23 mg/dL (ref 9–23)
BUN / CREAT RATIO: 26
CALCIUM: 9.7 mg/dL (ref 8.7–10.4)
CHLORIDE: 107 mmol/L (ref 98–107)
CO2: 26.7 mmol/L (ref 20.0–31.0)
CREATININE: 0.89 mg/dL
EGFR CKD-EPI (2021) MALE: >90 mL/min/{1.73_m2} (ref >=60)
GLUCOSE RANDOM: 121 mg/dL (ref 70–179)
POTASSIUM: 4.9 mmol/L — ABNORMAL HIGH (ref 3.4–4.8)
PROTEIN TOTAL: 7.6 g/dL (ref 5.7–8.2)
SODIUM: 141 mmol/L (ref 135–145)

## 2022-11-20 LAB — CBC W/ AUTO DIFF
BASOPHILS ABSOLUTE COUNT: 0.1 10*9/L (ref 0.0–0.1)
BASOPHILS RELATIVE PERCENT: 0.8 %
EOSINOPHILS ABSOLUTE COUNT: 0.2 10*9/L (ref 0.0–0.5)
EOSINOPHILS RELATIVE PERCENT: 1.7 %
HEMATOCRIT: 34.9 % — ABNORMAL LOW (ref 39.0–48.0)
HEMOGLOBIN: 11.3 g/dL — ABNORMAL LOW (ref 12.9–16.5)
LYMPHOCYTES ABSOLUTE COUNT: 2.5 10*9/L (ref 1.1–3.6)
LYMPHOCYTES RELATIVE PERCENT: 27.1 %
MEAN CORPUSCULAR HEMOGLOBIN CONC: 32.4 g/dL (ref 32.0–36.0)
MEAN CORPUSCULAR HEMOGLOBIN: 29.8 pg (ref 25.9–32.4)
MEAN CORPUSCULAR VOLUME: 91.9 fL (ref 77.6–95.7)
MEAN PLATELET VOLUME: 6.6 fL — ABNORMAL LOW (ref 6.8–10.7)
MONOCYTES ABSOLUTE COUNT: 0.9 10*9/L — ABNORMAL HIGH (ref 0.3–0.8)
MONOCYTES RELATIVE PERCENT: 9.8 %
NEUTROPHILS ABSOLUTE COUNT: 5.5 10*9/L (ref 1.8–7.8)
NEUTROPHILS RELATIVE PERCENT: 60.6 %
NUCLEATED RED BLOOD CELLS: 0 /100{WBCs} (ref <=4)
PLATELET COUNT: 273 10*9/L (ref 150–450)
RED BLOOD CELL COUNT: 3.79 10*12/L — ABNORMAL LOW (ref 4.26–5.60)
RED CELL DISTRIBUTION WIDTH: 17.6 % — ABNORMAL HIGH (ref 12.2–15.2)
WBC ADJUSTED: 9.1 10*9/L (ref 3.6–11.2)

## 2022-11-20 LAB — GAMMA GT: GAMMA GLUTAMYL TRANSFERASE: 35 U/L

## 2022-11-20 LAB — SLIDE REVIEW

## 2022-11-20 LAB — TACROLIMUS LEVEL, TROUGH: TACROLIMUS, TROUGH: 5.7 ng/mL (ref 5.0–15.0)

## 2022-11-20 LAB — AFP TUMOR MARKER: AFP-TUMOR MARKER: <2 ng/mL (ref <=8)

## 2022-11-20 LAB — BILIRUBIN, DIRECT: BILIRUBIN DIRECT: <0.1 mg/dL (ref 0.00–0.30)

## 2022-11-20 LAB — MAGNESIUM: MAGNESIUM: 1.7 mg/dL (ref 1.6–2.6)

## 2022-11-20 LAB — PHOSPHORUS: PHOSPHORUS: 3.7 mg/dL (ref 2.4–5.1)

## 2022-11-20 NOTE — Unmapped (Addendum)
[facility] at [facility]  Phone: [phone]         After Visit Instructions:  -- Continue prednisone taper as prescribed to avoid adrenal insufficiency  -- Continue to follow with your liver transplant team and obtain labs today  -- I will let you know about the timing of your next colonoscopy    -- I will see you back in clinic in 12 months. If you need to contact me, you may either send me a MyChart message or call the office and ask to speak with Zella Ball (the nurse that works with me). MyChart messages are for non-urgent questions and may take up to 5 days to receive a response.     -------------------------------------------------------------------------------------------------------  Thank you for visiting our Gastroenterology Clinic!    Please fill out this survey to help [place] improve our physician services. This survey is about your experience with your clinic doctor today. All answers are confidential. Your physician will not know who completed this survey. You can access the survey using the QR code, below, or by visiting: [email or web address]         APPOINTMENT SCHEDULING FOR GI CLINIC AND GI PROCEDURES:  GI MEDICINE CLINIC  [phone] option 1     GI PROCEDURES         [phone] option 2   *To schedule, reschedule, or cancel your GI appointment, please call [phone]. If you are unable to come to an appointment, please notify us as soon as possible, preferably 24 hours in advance. Doing so may allow other patients with urgent needs to be scheduled in a cancelled appointment slot.     RADIOLOGY - to schedule your imaging procedure, please call [phone] opt 1     TEST RESULTS   If you have a MyChart account, your new results will be sent to you through your MyChart account at [email or web address]. For results that require follow-up, a member of your healthcare team will also contact you directly.    PRESCRIPTION REFILL REQUESTS  To request prescription refills, please contact your pharmacy or send your healthcare team a message through your MyChart account at [email or web address]    RECORD REQUESTS  For questions related to medical records, please call Medical Records Release of Information at [phone]

## 2022-11-23 DIAGNOSIS — Z5181 Encounter for therapeutic drug level monitoring: Principal | ICD-10-CM

## 2022-11-23 DIAGNOSIS — E612 Magnesium deficiency: Principal | ICD-10-CM

## 2022-11-23 DIAGNOSIS — Z944 Liver transplant status: Principal | ICD-10-CM

## 2022-11-23 NOTE — Unmapped (Addendum)
Called pt to follow up on insulin management for steroid induced hyperglycemia in setting of prednisone taper. Pt is doing well and reports today will be the first day of his transition to every other day dosing x 3 doses (will take 5 mg prednisone today).     Pt reports adherence to NPH 24 units daily and Humalog per sliding scale. Pt states he sometimes misses lunch dose, which was evident by elevated BG on 9/28. Currently using Libre 3 CGM without issue and downloaded report from last 14 days, focusing on readings since last Thursday 9/26 after endocrinology visit on 9/25. Endorses occasional episodes of true hypoglycemia overnight (mid 60s), verified by glucometer readings and treated with glucose tablets.    Current Regimen:  Jardiance 25 mg daily  Pioglitazone 30 mg daily  Insulin NPH 24 units  Humalog per sliding scale AC  Breakfast         Lunch              Dinner  Below 150:      10                    14                    14   151-200:          12                    16                    16   201-250:          14                    18                    18   >251:               14                    20                    20     Assessment & Plan:  Elevated PPG after missing dose of Humalog indicates continued need for prandial insulin, at least while pt is on prednisone. However, pt is experiencing episodes of hypoglycemia overnight/late evening. This is most likely caused by evening Humalog. Will chose to decrease dinner Humalog sliding scale by 2 units.  Previously, pt was relatively well controlled on Jardiance, Ozempic, and metformin XR with A1C of 7.4% (09/15/22). Goal to eliminate Humalog and NPH. Will reduce NPH to 20 units to reflect taper off prednisone. Goal to stop NPH or eventually transition to basal insulin if pt unable to restart historical meds (Ozempic and metformin) due to GI effects.   Instructed pt that if he experiences lows after breakfast/lunch, after further reduction in prednisone dose, to reduce Humalog dose with respective meal by 2 units and contact clinic if he is experiencing persistent highs or lows.  DECREASE insulin NPH to 20 units daily  DECREASE dinner Humalog by 2 units  Breakfast         Lunch              Dinner  Below 150:      10  14                    12  151-200:          12                    16                    14   201-250:          14                    18                    16   >251:               14                    20                    18   Continue Jardiance 25 mg daily  Continue pioglitazone 30 mg daily  Follow up with CPP ~1 week after discontinuation of steroids    Kari Baars, PharmD  PGY-1 Ambulatory Care Pharmacy Resident

## 2022-11-24 NOTE — Unmapped (Signed)
I have reviewed the resident's note and agree with the assessment and plan.     Called patient to confirm plan as described by Dr. Huel Cote. Advised him that if he experiences hypoglycemia after stopping steroids on 10/2, that he should continue to taper his NPH by 2 units and Humalog by 2 units if having hypoglycemia after meals. CPP will call him in 1 week on 10/9 for CGM data review and to adjust insulin further.     Saddie Benders?? PharmD, CPP, BCACP, CDCES, completed 11/24/22 8:16 AM

## 2022-11-27 MED FILL — METOPROLOL TARTRATE 25 MG TABLET: ORAL | 90 days supply | Qty: 180 | Fill #1

## 2022-11-27 MED FILL — PRAVASTATIN 40 MG TABLET: ORAL | 30 days supply | Qty: 30 | Fill #1

## 2022-11-30 DIAGNOSIS — E612 Magnesium deficiency: Principal | ICD-10-CM

## 2022-11-30 DIAGNOSIS — Z5181 Encounter for therapeutic drug level monitoring: Principal | ICD-10-CM

## 2022-11-30 DIAGNOSIS — Z944 Liver transplant status: Principal | ICD-10-CM

## 2022-12-01 ENCOUNTER — Ambulatory Visit
Admission: RE | Admit: 2022-12-01 | Discharge: 2022-12-01 | Disposition: A | Discharge status: Home / Self Care | Payer: Medicare Other | Source: Ambulatory Visit | Attending: Family Medicine | Admitting: Family Medicine

## 2022-12-01 DIAGNOSIS — J189 Pneumonia, unspecified organism: Secondary | ICD-10-CM

## 2022-12-01 DIAGNOSIS — I517 Cardiomegaly: Secondary | ICD-10-CM | POA: Diagnosis not present

## 2022-12-02 DIAGNOSIS — E1165 Type 2 diabetes mellitus with hyperglycemia: Principal | ICD-10-CM

## 2022-12-02 MED ORDER — PIOGLITAZONE 30 MG TABLET
ORAL_TABLET | Freq: Every day | ORAL | 3 refills | 90 days | Status: CP
Start: 2022-12-02 — End: ?
  Filled 2022-12-21: qty 90, 90d supply, fill #0

## 2022-12-02 NOTE — Unmapped (Signed)
Called patient to follow up on BG since prednisone taper was stopped on 10/2. Spoke with Anthony Alvarado, who reports adherence to Jardiance 25 mg daily and pioglitazone 30 mg daily. States that he needs a refill on pioglitazone; prescription sent to [facility] pharmacy as requested.    States he stopped NPH on 10/5 and stopped Humalog on 10/7 due to hypoglycemia. Despite stopping insulins, still had hypoglycemia last night. Unclear if compression vs. true low. He did not verify low CGM alert with glucometer. He is aware of the proper treatment of hypoglycemia.    With shared decision making, will continue to HOLD insulin as overall glycemic control has improved since stopping steroids and insulin and continue Jardiance and pioglitazone as prescribed. Patient was previously on metformin and Ozempic, but these medications were discontinued due to exacerbation of GI issues which are currently being worked up.     He is currently enrolled in [address] for Jardiance. He may benefit from adding on Tradjenta to his current regimen to replace metformin/Ozempic and delay need for chronic/long-term insulin. Patient is aware that [facility] is currently experiencing delays due to recent change in pharmacies.     PLAN:  START Tradjenta 5 mg PO daily  Prescription faxed to [facility] today  Continue Jardiance 25 mg PO daily  Continue pioglitazone 30 mg PO daily  Continue to HOLD NPH and Humalog insulins  Reviewed signs/symptoms/treatment of hypoglycemia. Continue Libre 3 CGM and encouraged patient to verify low alerts with [facility].  Follow up with clinical pharmacist on 01/05/2023   Follow up with Dr. Beryle Flock MD on 03/29/2023.    Note forwarded to Dr. Dell Ponto for her awareness.    Saddie Benders?? PharmD, CPP, BCACP, CDCES, completed 12/02/22 8:46 AM

## 2022-12-07 DIAGNOSIS — Z944 Liver transplant status: Principal | ICD-10-CM

## 2022-12-07 DIAGNOSIS — E612 Magnesium deficiency: Principal | ICD-10-CM

## 2022-12-07 DIAGNOSIS — Z5181 Encounter for therapeutic drug level monitoring: Principal | ICD-10-CM

## 2022-12-11 MED ORDER — PREDNISONE 10 MG TABLET
ORAL_TABLET | ORAL | 0 refills | 42 days
Start: 2022-12-11 — End: 2023-01-21

## 2022-12-14 ENCOUNTER — Ambulatory Visit: Admit: 2022-12-14 | Discharge: 2022-12-15 | Payer: MEDICARE

## 2022-12-14 DIAGNOSIS — Z5181 Encounter for therapeutic drug level monitoring: Principal | ICD-10-CM

## 2022-12-14 DIAGNOSIS — Z944 Liver transplant status: Principal | ICD-10-CM

## 2022-12-14 DIAGNOSIS — E612 Magnesium deficiency: Principal | ICD-10-CM

## 2022-12-14 DIAGNOSIS — K644 Residual hemorrhoidal skin tags: Principal | ICD-10-CM

## 2022-12-14 DIAGNOSIS — K529 Noninfective gastroenteritis and colitis, unspecified: Principal | ICD-10-CM

## 2022-12-14 MED ORDER — PREDNISONE 5 MG TABLET
ORAL_TABLET | ORAL | 0 refills | 22 days
Start: 2022-12-14 — End: 2023-01-04

## 2022-12-14 NOTE — Unmapped (Signed)
Anthony Alvarado  62 y.o. Sep 04, 1960  Phone: [phone] (home)   Address: [address]  [facility] [place] [address]   MRN: [id number]  Primary MD : [name]   [facility] at [facility]     Problem List Items Addressed This Visit          Cardiovascular and Mediastinum    Hemorrhoids, external - Primary     Improved with medical treatment  [name] hemorrhoid compound as needed  Supplement diet with Water (64 oz) & Fiber (30g) daily  Avoid constipation & Straining  Follow up as needed                 Hemorrhoid Consultation Note    Past Medical History:   Diagnosis Date    Aortic valve stenosis     Autoimmune cholangitis     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    Cholestatic cirrhosis (CMS-HCC)     Hepatic encephalopathy (CMS-HCC)     Hypertension     Other osteoporosis without current pathological fracture     Pneumonia 10/2022    Sleep apnea     Type 2 diabetes mellitus (CMS-HCC)      Patient Active Problem List   Diagnosis    Diabetes mellitus without complication (CMS-HCC)    Hypothyroidism    Cirrhosis of liver without ascites (CMS-HCC)    OSA (obstructive sleep apnea)    Essential hypertension    Encounter for pre-transplant evaluation for chronic liver disease    Pleural effusion    Pneumothorax after biopsy    Liver transplant recipient (CMS-HCC)    Allergic rhinitis    Arthralgia    Asthma    Bicuspid aortic valve    Flying phobia    Mucosal abnormality of stomach    Nephrolithiasis    Pure hypercholesterolemia    Umbilical hernia    Unspecified hearing loss    COVID-19    Colitis    Hemorrhoids, external    Steatosis of liver    Steroid-induced hyperglycemia    Leukocytosis    Hyponatremia with increased serum osmolality     Past Surgical History:   Procedure Laterality Date    APPENDECTOMY      CHG US GUIDE, TISSUE ABLATION N/A 05/03/2018    Procedure: ULTRASOUND GUIDANCE FOR, AND MONITORING OF, PARENCHYMAL TISSUE ABLATION;  Surgeon: Particia Nearing, MD;  Location: MAIN OR [facility];  Service: Transplant    LIVER TRANSPLANTATION  09/16/2018    PR CATH PLACE/CORON ANGIO, IMG SUPER/INTERP,R&L HRT CATH, L HRT VENTRIC N/A 03/21/2018    Procedure: Left/Right Heart Catheterization;  Surgeon: Neal Dy, MD;  Location: [facility] CATH;  Service: Cardiology    PR COLONOSCOPY W/BIOPSY SINGLE/MULTIPLE N/A 09/09/2022    Procedure: COLONOSCOPY, FLEXIBLE, PROXIMAL TO SPLENIC FLEXURE; WITH BIOPSY, SINGLE OR MULTIPLE;  Surgeon: Jules Husbands, MD;  Location: GI PROCEDURES [facility];  Service: Gastroenterology    PR DECORTICATION,PULMONARY,TOTAL Right 01/13/2019    Procedure: Decortic Pulm (Separt Proc); Tot;  Surgeon: Evert Kohl, MD;  Location: MAIN OR [facility];  Service: Thoracic    PR EGD FLEXIBLE FOREIGN BODY REMOVAL N/A 12/26/2018    Procedure: UGI ENDOSCOPY; W/REMOVAL FOREIGN BODY;  Surgeon: Vonda Antigua, MD;  Location: [facility] PROC[facility]URES [facility];  Service: Gastroenterology    PR LAP,ABLAT 1+ LIVER TUMOR(S),RADIOFREQ N/A 05/03/2018    Procedure: LAPAROSCOPY, SURGICAL, ABLATION OF 1 OR MORE LIVER TUMOR(S); RADIOFREQUENCY;  Surgeon:  Particia Nearing, MD;  Location: MAIN OR [facility];  Service: Transplant    PR PERQ DRAINAGE PLEURA INSERT CATH W/IMAGING N/A 01/11/2019    Procedure: PLEURAL DRAINAGE, PERC, W INSERTION OF INDWELLING CATHETER; W IMAGING GUIDANCE;  Surgeon: Jerelyn Charles, MD;  Location: BRONCH PROCEDURE LAB [facility];  Service: Pulmonary    PR SIGMOIDOSCOPY FLX DX W/COLLJ SPEC BR/WA IF PFRMD N/A 10/02/2022    Procedure: SIGMOIDOSCOPY, FLEXIBLE; DIAGNOSTIC, WITH OR WITHOUT COLLECTION OF SPECIMEN(S) BY BRUSHING OR WASHING;  Surgeon: Cira Servant, DO;  Location: GI PROC[facility]URES [facility];  Service: Gastroenterology    PR THORACENTESIS NEEDLE/CATH PLEURA W/IMAGING N/A 01/06/2019    Procedure: THORACENTESIS W/ IMAGING;  Surgeon: Cleora Fleet, MD;  Location: BRONCH PROCEDURE LAB [facility];  Service: Pulmonary    PR THORACOSCOPY SURG W/PLEURODESIS Right 01/13/2019    Procedure: THORACOSCOPY, SURGICAL; WITH PLEURODESIS (EG, MECHANICAL OR CHEMICAL);  Surgeon: Evert Kohl, MD;  Location: MAIN OR [facility];  Service: Thoracic    PR TRANSPLANT LIVER,ALLOTRANSPLANT Bilateral 09/16/2018    Procedure: LIVER ALLOTRANSPLANTATION; ORTHOTOPIC, PARTIAL OR WHOLE, FROM CADAVER OR LIVING DONOR, ANY AGE;  Surgeon: Florene Glen, MD;  Location: MAIN OR [place];  Service: Transplant    PR TRANSPLANT,PREP DONOR LIVER, WHOLE N/A 09/16/2018    Procedure: BACKBNCH STD PREP CAD DONOR WHOLE LIVER GFT PRIOR TNSPLNT,INC CHOLE,DISS/REM SURR TISSU WO TRISEG/LOBE SPLT;  Surgeon: Florene Glen, MD;  Location: MAIN OR [place];  Service: Transplant    SKIN BIOPSY         Allergies   Allergen Reactions    Mycophenolate Mofetil Diarrhea     Mycophenolate colitis     Watermelon Flavor      Mouth itching         Current Outpatient Medications:     azelastine (ASTELIN) 137 mcg (0.1 %) nasal spray, 1 spray into each nostril daily as needed., Disp: , Rfl:     calcium carbonate (TUMS) 200 mg calcium (500 mg) chewable tablet, Chew 1 tablet (500 mg total) daily. Pt reports taking every other day., Disp: , Rfl:     dapsone 100 MG tablet, Take 1 tablet (100 mg total) by mouth daily., Disp: 30 tablet, Rfl: 2    empagliflozin (JARDIANCE) 25 mg tablet, Take 1 tablet (25 mg total) by mouth daily., Disp: , Rfl:     fluticasone propionate (FLONASE) 50 mcg/actuation nasal spray, PLACE ONE OR TWO SPRAYS INTO BOTH NOSTRILS DAILY AS NEEDED., Disp: , Rfl:     glucose 4 GM chewable tablet, Chew 4 tablets (16 g total) every ten (10) minutes as needed for low blood sugar ((For Blood Glucose LESS than 70 mg/dL and GREATER than or EQUAL to 54 mg/dL and able to take by mouth.))., Disp: 50 tablet, Rfl: 12    levothyroxine (SYNTHROID) 175 MCG tablet, Take 1 tablet (175 mcg total) by mouth daily before breakfast., Disp: 90 tablet, Rfl: 1    linagliptin (TRADJENTA) 5 mg Tab, Take 1 tablet (5 mg total) by mouth daily., Disp: , Rfl:     metoPROLOL tartrate (LOPRESSOR) 25 MG tablet, Take 1 tablet (25 mg total) by mouth two (2) times a day., Disp: 180 tablet, Rfl: 1    pen needle, diabetic 32 gauge x 5/32 (4 mm) Ndle, Use with insulin up to 4 times/day as needed., Disp: 100 each, Rfl: 1    pioglitazone (ACTOS) 30 MG tablet, Take 1 tablet (30 mg total) by mouth daily., Disp: 90 tablet, Rfl: 3    pravastatin (PRAVACHOL)  40 MG tablet, Take 1 tablet (40 mg total) by mouth every evening., Disp: 30 tablet, Rfl: 1    sertraline (ZOLOFT) 50 MG tablet, Take 1 tablet (50 mg total) by mouth daily., Disp: 90 tablet, Rfl: 3    tacrolimus (PROGRAF) 1 MG capsule, Take 3 capsules (3 mg total) by mouth two (2) times a day., Disp: 180 capsule, Rfl: 11    amoxicillin-clavulanate (AUGMENTIN) 875-125 mg per tablet, Take 1 mg by mouth Two (2) times a day (at 8am and 12:00)., Disp: , Rfl:     aspirin 325 MG tablet, Take 1 tablet (325 mg total) by mouth daily., Disp: , Rfl:     blood sugar diagnostic (ONETOUCH ULTRA TEST) Strp, USE TO CHECK SUGAR DAILY, Disp: , Rfl:     blood-glucose sensor (FREESTYLE LIBRE 3 SENSOR) Devi, For use as directed. change every 14 days., Disp: 3 each, Rfl: 11    doxycycline (VIBRA-TABS) 100 MG tablet, Take 1 tablet (100 mg total) by mouth., Disp: , Rfl:     glycerin/witch hazel leaf (HEMORRHOID TOP), Apply topically., Disp: , Rfl:     hydrocortisone 1 % crpe, APPLY TO AFFECTED AREA TWICE A DAY, Disp: , Rfl:   No current facility-administered medications for this visit.    Facility-Administered Medications Ordered in Other Visits:     diazePAM (VALIUM) 5 mg/mL injection, , , ,      reports that he has never smoked. He has never used smokeless tobacco. He reports that he does not currently use alcohol. He reports that he does not use drugs.    He indicated that his mother is deceased. He indicated that his father is deceased. He indicated that two of his three sisters are alive. He indicated that the status of his maternal grandfather is unknown. He indicated that his daughter is alive. He indicated that the status of his neg hx is unknown.           HPI:    Patient ID: Anthony Alvarado, 62 y.o., male  Duration: several years (patient has prior history of cirrhosis status post liver transplant)  Location:external, bleeding  Severity:severe  Associated symptoms: external bleeding hemorrhoids  Alleviating factors: Resolution of colitis and diarrhea  Aggravating factors: Diarrhea and the patient's had a recent bout of colitis beginning in June 2024 with multiple episodes of diarrhea which is aggravated the hemorrhoids resulting in hematochezia patient was hospitalized approximately 2 weeks ago .  Daily aspirin use 325 mg  Last colonoscopy:10/02/2022 flexible sigmoidoscopy -hemorrhoids found on perianal exam, solid stool found in sigmoid colon precluding visualization, rectum rectosigmoid colon and sigmoid colon otherwise appeared normal  Previous Treatment: Preparation H, stool softeners, fiber supplements, bleeding has subsided after resolution of colitis and diarrhea    12/14/2022  Patient denies any further rectal bleeding.  Hemorrhoids are currently asymptomatic.  He reports good success with use of [facility] hemorrhoid compound.  He denies any further diarrhea.    Review of Systems   Constitutional:  Negative for chills, fever, malaise/fatigue and weight loss.   HENT:  Negative for congestion and sore throat.    Eyes:  Negative for double vision, photophobia and redness.   Respiratory:  Negative for cough, hemoptysis, shortness of breath, wheezing and stridor.    Cardiovascular:  Negative for chest pain, palpitations, orthopnea, claudication and leg swelling.   Gastrointestinal:  Positive for blood in stool and diarrhea. Negative for abdominal pain, constipation, melena, nausea and vomiting.   Genitourinary:  Negative for frequency,  hematuria and urgency.   Musculoskeletal: Negative for joint pain.   Skin:  Negative for itching and rash.   Neurological:  Negative for dizziness, tremors, focal weakness, seizures, loss of consciousness, weakness and headaches.   Endo/Heme/Allergies:  Negative for polydipsia. Does not bruise/bleed easily.   Psychiatric/Behavioral:  Negative for memory loss and suicidal ideas. The patient does not have insomnia.      PHYSICAL EXAM:   BP 142/90  - Pulse 76  - Temp 36.6 ??C (97.9 ??F) (Temporal)  - Ht 172.7 cm (5' 8)  - Wt 83.6 kg (184 lb 6.4 oz)  - SpO2 98%  - BMI 28.04 kg/m??     Physical Exam  Constitutional:       General: He is not in acute distress.     Appearance: Normal appearance. He is well-developed and normal weight. He is not ill-appearing, toxic-appearing or diaphoretic.   HENT:      Head: Normocephalic and atraumatic.      Right Ear: External ear normal.      Left Ear: External ear normal.      Nose: Nose normal.      Mouth/Throat:      Pharynx: No oropharyngeal exudate.   Eyes:      General: No scleral icterus.        Right eye: No discharge.         Left eye: No discharge.      Conjunctiva/sclera: Conjunctivae normal.      Pupils: Pupils are equal, round, and reactive to light.   Neck:      Thyroid: No thyromegaly.      Vascular: No JVD.      Trachea: No tracheal deviation.   Cardiovascular:      Rate and Rhythm: Normal rate and regular rhythm.      Heart sounds: Normal heart sounds. No murmur heard.     No friction rub. No gallop.   Pulmonary:      Effort: Pulmonary effort is normal. No respiratory distress.      Breath sounds: Normal breath sounds. No stridor. No wheezing, rhonchi or rales.   Chest:      Chest wall: No tenderness.   Abdominal:      General: Bowel sounds are normal. There is no distension.      Palpations: Abdomen is soft. There is no mass.      Tenderness: There is no abdominal tenderness. There is no right CVA tenderness, left CVA tenderness, guarding or rebound.      Hernia: No hernia is present.   Genitourinary: Comments: 2 external hemorrhoids  Non thrombosed  Non inflamed  Non tender  No bleeding  Musculoskeletal:         General: No tenderness or deformity. Normal range of motion.      Cervical back: Normal range of motion and neck supple. No rigidity or tenderness.   Lymphadenopathy:      Cervical: No cervical adenopathy.   Skin:     General: Skin is warm and dry.      Coloration: Skin is not pale.      Findings: No erythema or rash.   Neurological:      General: No focal deficit present.      Mental Status: He is alert and oriented to person, place, and time.      Cranial Nerves: No cranial nerve deficit.      Motor: Weakness present.      Coordination: Coordination normal.  Gait: Gait normal.      Deep Tendon Reflexes: Reflexes are normal and symmetric.   Psychiatric:         Mood and Affect: Mood normal.         Behavior: Behavior normal.         Thought Content: Thought content normal.         Judgment: Judgment normal.        Data Review:    Lab Results   Component Value Date    WBC 9.1 11/20/2022    HGB 11.3 (L) 11/20/2022    HCT 34.9 (L) 11/20/2022    PLT 273 11/20/2022       Lab Results   Component Value Date    NA 141 11/20/2022    K 4.9 (H) 11/20/2022    CL 107 11/20/2022    CO2 26.7 11/20/2022    BUN 23 11/20/2022    CREATININE 0.89 11/20/2022    GLU 121 11/20/2022    CALCIUM 9.7 11/20/2022    MG 1.7 11/20/2022    PHOS 3.7 11/20/2022       Lab Results   Component Value Date    BILITOT 0.3 11/20/2022    BILIDIR <0.10 11/20/2022    PROT 7.6 11/20/2022    ALBUMIN 3.7 11/20/2022    ALT 16 11/20/2022    AST 18 11/20/2022    ALKPHOS 89 11/20/2022    GGT 35 11/20/2022       Lab Results   Component Value Date    PT 13.0 (H) 10/31/2022    INR 1.17 10/31/2022    APTT 29.6 10/12/2018     Imaging:    IMPRESSION: CTA Abdomen Pelvis W Wo Contrast : 09/21/2022  1. Evidence of ongoing widespread Colitis since the CT last month,   most pronounced in the sigmoid colon.   New small volume of simple density free fluid in the pelvis, likely   reactive. No evidence of bowel perforation or obstruction.   Negative for active GI bleeding by CTA.   2. No other acute or inflammatory process identified.     IMPRESSION: CT Abdomen Pelvis W Contrast 08/16/2022  1. Moderate severity descending and sigmoid colitis.   2. Evidence of prior cholecystectomy and prior appendectomy.   3. Mild to moderate severity urinary bladder wall thickening, which   may be secondary to chronic bladder outlet obstruction. Further   evaluation with urinalysis is recommended, as sequelae associated   with acute cystitis cannot be excluded.

## 2022-12-14 NOTE — Unmapped (Signed)
Improved with medical treatment  [name] hemorrhoid compound as needed  Supplement diet with Water (64 oz) & Fiber (30g) daily  Avoid constipation & Straining  Follow up as needed

## 2022-12-15 NOTE — Unmapped (Signed)
[facility] Refill Coordination Note    Specialty Medication(s) to be Shipped:   Transplant: tacrolimus 1mg     Other medication(s) to be shipped:  freestyle sensor ,sertraline,prednisone,pioglitazone     Anthony Alvarado, DOB: 12/14/1960  Phone: [phone] (home)       All above HIPAA information was verified with patient.     Was a Nurse, learning disability used for this call? No    Completed refill call assessment today to schedule patient's medication shipment from the [facility]  [phone]).  All relevant notes have been reviewed.     Specialty medication(s) and dose(s) confirmed: Regimen is correct and unchanged.   Changes to medications: [name] reports no changes at this time.  Changes to insurance: No  New side effects reported not previously addressed with a pharmacist or physician: None reported  Questions for the pharmacist: No    Confirmed patient received a Conservation officer, historic buildings and a Surveyor, mining with first shipment. The patient will receive a drug information handout for each medication shipped and additional FDA Medication Guides as required.       DISEASE/MEDICATION-SPECIFIC INFORMATION        N/A    SPECIALTY MEDICATION ADHERENCE     Medication Adherence    Patient reported X missed doses in the last month: 0  Specialty Medication: tacrolimus 1 MG capsule (PROGRAF)  Patient is on additional specialty medications: No              Were doses missed due to medication being on hold? No      tacrolimus 1 MG capsule (PROGRAF): 7 days of medicine on hand       REFERRAL TO PHARMACIST     Referral to the pharmacist: Not needed      [facility]     Shipping address confirmed in Epic.       Delivery Scheduled: Yes, Expected medication delivery date: 12/22/2022.     Medication will be delivered via UPS to the prescription address in Epic WAM.    [facility]  Specialty Technician

## 2022-12-18 MED ORDER — PREDNISONE 5 MG TABLET
ORAL_TABLET | ORAL | 0 refills | 22.00000 days
Start: 2022-12-18 — End: 2023-01-08

## 2022-12-18 NOTE — Unmapped (Signed)
I only work inpatient.  He will need to see PCP

## 2022-12-21 DIAGNOSIS — Z5181 Encounter for therapeutic drug level monitoring: Principal | ICD-10-CM

## 2022-12-21 DIAGNOSIS — E612 Magnesium deficiency: Principal | ICD-10-CM

## 2022-12-21 DIAGNOSIS — Z944 Liver transplant status: Principal | ICD-10-CM

## 2022-12-21 DIAGNOSIS — K529 Noninfective gastroenteritis and colitis, unspecified: Principal | ICD-10-CM

## 2022-12-21 MED FILL — SERTRALINE 50 MG TABLET: ORAL | 90 days supply | Qty: 90 | Fill #1

## 2022-12-21 MED FILL — TACROLIMUS 1 MG CAPSULE, IMMEDIATE-RELEASE: ORAL | 30 days supply | Qty: 180 | Fill #2

## 2022-12-21 MED FILL — FREESTYLE LIBRE 3 PLUS SENSOR DEVICE: 28 days supply | Qty: 2 | Fill #1

## 2022-12-21 NOTE — Unmapped (Addendum)
Received refill request for patient's prednisone. Spoke with patient, who reports that he completed the taper weeks ago. He stated that his diarrhea has resolved,he has no more melena, and he saw a Careers adviser about his hemorrhoids. Per patient, the surgeon felt no intervention was needed, since he is no longer having active bleeding. GI provider, Dr.Lupu, seen on 9/27 and recommended f/up cancer screening colonoscopy in 1 yr, near the time for his next GI f/up.

## 2022-12-22 MED ORDER — PREDNISONE 5 MG TABLET
ORAL_TABLET | ORAL | 0 refills | 22 days
Start: 2022-12-22 — End: 2023-01-12

## 2022-12-22 NOTE — Unmapped (Signed)
[facility] Clinical Assessment & Refill Coordination Note    Anthony Alvarado, [place]: Jan 13, 1961  Phone: [phone] (home)     All above HIPAA information was verified with patient.     Was a Nurse, learning disability used for this call? No    Specialty Medication(s):   Transplant: tacrolimus 1mg      Current Outpatient Medications   Medication Sig Dispense Refill    amoxicillin-clavulanate (AUGMENTIN) 875-125 mg per tablet Take 1 mg by mouth Two (2) times a day (at 8am and 12:00).      aspirin 325 MG tablet Take 1 tablet (325 mg total) by mouth daily.      azelastine (ASTELIN) 137 mcg (0.1 %) nasal spray 1 spray into each nostril daily as needed.      blood sugar diagnostic (ONETOUCH ULTRA TEST) Strp USE TO CHECK SUGAR DAILY      blood-glucose sensor (FREESTYLE LIBRE 3 SENSOR) Devi For use as directed. change every 14 days. 3 each 11    calcium carbonate (TUMS) 200 mg calcium (500 mg) chewable tablet Chew 1 tablet (500 mg total) daily. Pt reports taking every other day.      dapsone 100 MG tablet Take 1 tablet (100 mg total) by mouth daily. 30 tablet 2    doxycycline (VIBRA-TABS) 100 MG tablet Take 1 tablet (100 mg total) by mouth.      empagliflozin (JARDIANCE) 25 mg tablet Take 1 tablet (25 mg total) by mouth daily.      fluticasone propionate (FLONASE) 50 mcg/actuation nasal spray PLACE ONE OR TWO SPRAYS INTO BOTH NOSTRILS DAILY AS NEEDED.      glucose 4 GM chewable tablet Chew 4 tablets (16 g total) every ten (10) minutes as needed for low blood sugar ((For Blood Glucose LESS than 70 mg/dL and GREATER than or EQUAL to 54 mg/dL and able to take by mouth.)). 50 tablet 12    glycerin/witch hazel leaf (HEMORRHOID TOP) Apply topically.      hydrocortisone 1 % crpe APPLY TO AFFECTED AREA TWICE A DAY      levothyroxine (SYNTHROID) 175 MCG tablet Take 1 tablet (175 mcg total) by mouth daily before breakfast. 90 tablet 1    linagliptin (TRADJENTA) 5 mg Tab Take 1 tablet (5 mg total) by mouth daily. metoPROLOL tartrate (LOPRESSOR) 25 MG tablet Take 1 tablet (25 mg total) by mouth two (2) times a day. 180 tablet 1    pen needle, diabetic 32 gauge x 5/32 (4 mm) Ndle Use with insulin up to 4 times/day as needed. 100 each 1    pioglitazone (ACTOS) 30 MG tablet Take 1 tablet (30 mg total) by mouth daily. 90 tablet 3    pravastatin (PRAVACHOL) 40 MG tablet Take 1 tablet (40 mg total) by mouth every evening. 30 tablet 1    sertraline (ZOLOFT) 50 MG tablet Take 1 tablet (50 mg total) by mouth daily. 90 tablet 3    tacrolimus (PROGRAF) 1 MG capsule Take 3 capsules (3 mg total) by mouth two (2) times a day. 180 capsule 11     No current facility-administered medications for this visit.     Facility-Administered Medications Ordered in Other Visits   Medication Dose Route Frequency Provider Last Rate Last Admin    diazePAM (VALIUM) 5 mg/mL injection                 Changes to medications: [name] reports no changes at this time.    Allergies   Allergen  Reactions    Mycophenolate Mofetil Diarrhea     Mycophenolate colitis     Watermelon Flavor      Mouth itching       Changes to allergies: No    SPECIALTY MEDICATION ADHERENCE     Tacrolimus 1 mg: 30 days of medicine on hand     Medication Adherence    Patient reported X missed doses in the last month: 0  Specialty Medication: Tacrolimus 1mg   Patient is on additional specialty medications: No          Specialty medication(s) dose(s) confirmed: Regimen is correct and unchanged.     Are there any concerns with adherence? No    Adherence counseling provided? Not needed    CLINICAL MANAGEMENT AND INTERVENTION      Clinical Benefit Assessment:    Do you feel the medicine is effective or helping your condition? Yes    Clinical Benefit counseling provided? Not needed    Adverse Effects Assessment:    Are you experiencing any side effects? No    Are you experiencing difficulty administering your medicine? No    Quality of Life Assessment:    Quality of Life Rheumatology  Oncology  Dermatology  Cystic Fibrosis          How many days over the past month did your liver transplant  keep you from your normal activities? For example, brushing your teeth or getting up in the morning. 0    Have you discussed this with your provider? Not needed    Acute Infection Status:    Acute infections noted within Epic:  No active infections  Patient reported infection: None    Therapy Appropriateness:    Is therapy appropriate based on current medication list, adverse reactions, adherence, clinical benefit and progress toward achieving therapeutic goals? Yes, therapy is appropriate and should be continued     DISEASE/MEDICATION-SPECIFIC INFORMATION      N/A    Solid Organ Transplant: Not Applicable    PATIENT SPECIFIC NEEDS     Does the patient have any physical, cognitive, or cultural barriers? No    Is the patient high risk? No    Did the patient require a clinical intervention? No    Does the patient require physician intervention or other additional services (i.e., nutrition, smoking cessation, social work)? No    SOCIAL DETERMINANTS OF HEALTH     At the [facility], we have learned that life circumstances - like trouble affording food, housing, utilities, or transportation can affect the health of many of our patients.   That is why we wanted to ask: are you currently experiencing any life circumstances that are negatively impacting your health and/or quality of life? Patient declined to answer    Social Determinants of Health     Food Insecurity: No Food Insecurity (09/22/2022)    Received from [facility]    Hunger Vital Sign     Worried About Running Out of Food in the Last Year: Never true     Ran Out of Food in the Last Year: Never true   Internet Connectivity: Not on file   Housing/Utilities: Low Risk  (09/30/2022)    Housing/Utilities     Within the past 12 months, have you ever stayed: outside, in a car, in a tent, in an overnight shelter, or temporarily in someone else's home (i.e. couch-surfing)?: No     Are you worried about losing your housing?: No     Within the  past 12 months, have you been unable to get utilities (heat, electricity) when it was really needed?: No   Tobacco Use: Low Risk  (12/14/2022)    Patient History     Smoking Tobacco Use: Never     Smokeless Tobacco Use: Never     Passive Exposure: Not on file   Transportation Needs: No Transportation Needs (09/22/2022)    Received from [facility] - Transportation     Lack of Transportation (Medical): No     Lack of Transportation (Non-Medical): No   Alcohol Use: Not on file   Interpersonal Safety: Unknown (12/22/2022)    Interpersonal Safety     Unsafe Where You Currently Live: Not on file     Physically Hurt by Anyone: Not on file     Abused by Anyone: Not on file   Physical Activity: Unknown (08/18/2022)    Received from [facility]    Exercise Vital Sign     Days of Exercise per Week: Patient declined     Minutes of Exercise per Session: 0 min   Intimate Partner Violence: Not At Risk (09/22/2022)    Received from [facility]    Humiliation, Afraid, Rape, and Kick questionnaire     Fear of Current or Ex-Partner: No     Emotionally Abused: No     Physically Abused: No     Sexually Abused: No   Stress: No Stress Concern Present (08/18/2022)    Received from [facility] - Occupational Stress Questionnaire     Feeling of Stress : Only a little   Substance Use: Not on file   Social Connections: Moderately Integrated (08/18/2022)    Received from [facility]    Social Connection and Isolation Panel [NHANES]     Frequency of Communication with Friends and Family: More than three times a week     Frequency of Social Gatherings with Friends and Family: Once a week     Attends Religious Services: More than 4 times per year     Active Member of [employer] or Organizations: Yes     Attends Engineer, structural: More than 4 times per year     Marital Status: Separated Physicist, medical Strain: Low Risk  (09/30/2022)    Overall Financial Resource Strain (CARDIA)     Difficulty of Paying Living Expenses: Not very hard   Depression: Not on file   Health Literacy: Not on file       Would you be willing to receive help with any of the needs that you have identified today? Not applicable       SHIPPING     Specialty Medication(s) to be Shipped:   Transplant: Patient declined tacrolimus today.    Other medication(s) to be shipped: No additional medications requested for fill at this time     Changes to insurance: No    Delivery Scheduled: Patient declined refill at this time due to has 30 days on hand..     Medication will be delivered via UPS to the confirmed prescription address in [facility].    The patient will receive a drug information handout for each medication shipped and additional FDA Medication Guides as required.  Verified that patient has previously received a Conservation officer, historic buildings and a Surveyor, mining.    The patient or caregiver noted above participated in the development of this care plan and knows that they can request review  of or adjustments to the care plan at any time.      All of the patient's questions and concerns have been addressed.    [name], [facility]   [facility] Specialty Pharmacist

## 2022-12-24 ENCOUNTER — Other Ambulatory Visit: Payer: Self-pay | Admitting: Family Medicine

## 2022-12-24 DIAGNOSIS — J189 Pneumonia, unspecified organism: Secondary | ICD-10-CM

## 2022-12-28 DIAGNOSIS — E612 Magnesium deficiency: Principal | ICD-10-CM

## 2022-12-28 DIAGNOSIS — Z944 Liver transplant status: Principal | ICD-10-CM

## 2022-12-28 DIAGNOSIS — Z5181 Encounter for therapeutic drug level monitoring: Principal | ICD-10-CM

## 2023-01-01 DIAGNOSIS — Z5181 Encounter for therapeutic drug level monitoring: Secondary | ICD-10-CM | POA: Diagnosis not present

## 2023-01-01 DIAGNOSIS — E612 Magnesium deficiency: Secondary | ICD-10-CM | POA: Diagnosis not present

## 2023-01-01 DIAGNOSIS — Z944 Liver transplant status: Secondary | ICD-10-CM | POA: Diagnosis not present

## 2023-01-02 LAB — CBC W/ DIFFERENTIAL
BANDED NEUTROPHILS ABSOLUTE COUNT: 0.1 10*3/uL (ref 0.0–0.1)
BASOPHILS ABSOLUTE COUNT: 0.1 10*3/uL (ref 0.0–0.2)
BASOPHILS RELATIVE PERCENT: 1 %
EOSINOPHILS ABSOLUTE COUNT: 0.5 10*3/uL — ABNORMAL HIGH (ref 0.0–0.4)
EOSINOPHILS RELATIVE PERCENT: 4 %
HEMATOCRIT: 42 % (ref 37.5–51.0)
HEMOGLOBIN: 13.4 g/dL (ref 13.0–17.7)
IMMATURE GRANULOCYTES: 1 %
LYMPHOCYTES ABSOLUTE COUNT: 2.4 10*3/uL (ref 0.7–3.1)
LYMPHOCYTES RELATIVE PERCENT: 21 %
MEAN CORPUSCULAR HEMOGLOBIN CONC: 31.9 g/dL (ref 31.5–35.7)
MEAN CORPUSCULAR HEMOGLOBIN: 28.2 pg (ref 26.6–33.0)
MEAN CORPUSCULAR VOLUME: 88 fL (ref 79–97)
MONOCYTES ABSOLUTE COUNT: 1 10*3/uL — ABNORMAL HIGH (ref 0.1–0.9)
MONOCYTES RELATIVE PERCENT: 9 %
NEUTROPHILS ABSOLUTE COUNT: 7.1 10*3/uL — ABNORMAL HIGH (ref 1.4–7.0)
NEUTROPHILS RELATIVE PERCENT: 64 %
PLATELET COUNT: 268 10*3/uL (ref 150–450)
RED BLOOD CELL COUNT: 4.76 x10E6/uL (ref 4.14–5.80)
RED CELL DISTRIBUTION WIDTH: 13.7 % (ref 11.6–15.4)
WHITE BLOOD CELL COUNT: 11.3 10*3/uL — ABNORMAL HIGH (ref 3.4–10.8)

## 2023-01-02 LAB — MAGNESIUM: MAGNESIUM: 1.8 mg/dL (ref 1.6–2.3)

## 2023-01-02 LAB — COMPREHENSIVE METABOLIC PANEL
ALBUMIN: 4.4 g/dL (ref 3.8–4.9)
ALKALINE PHOSPHATASE: 109 IU/L (ref 44–121)
ALT (SGPT): 16 IU/L (ref 0–44)
AST (SGOT): 19 IU/L (ref 0–40)
BILIRUBIN TOTAL (MG/DL) IN SER/PLAS: 0.2 mg/dL (ref 0.0–1.2)
BLOOD UREA NITROGEN: 21 mg/dL (ref 8–27)
BUN / CREAT RATIO: 19 (ref 10–24)
CALCIUM: 9.3 mg/dL (ref 8.6–10.2)
CHLORIDE: 104 mmol/L (ref 96–106)
CO2: 21 mmol/L (ref 20–29)
CREATININE: 1.09 mg/dL (ref 0.76–1.27)
EGFR: 78 mL/min/{1.73_m2}
GLOBULIN, TOTAL: 2.9 g/dL (ref 1.5–4.5)
GLUCOSE: 112 mg/dL — ABNORMAL HIGH (ref 70–99)
POTASSIUM: 4.4 mmol/L (ref 3.5–5.2)
SODIUM: 139 mmol/L (ref 134–144)
TOTAL PROTEIN: 7.3 g/dL (ref 6.0–8.5)

## 2023-01-02 LAB — BILIRUBIN, DIRECT: BILIRUBIN DIRECT: 0.1 mg/dL (ref 0.00–0.40)

## 2023-01-02 LAB — PHOSPHORUS: PHOSPHORUS, SERUM: 3.5 mg/dL (ref 2.8–4.1)

## 2023-01-02 LAB — GAMMA GT: GAMMA GLUTAMYL TRANSFERASE: 23 IU/L (ref 0–65)

## 2023-01-04 ENCOUNTER — Ambulatory Visit (INDEPENDENT_AMBULATORY_CARE_PROVIDER_SITE_OTHER): Payer: Medicare Other

## 2023-01-04 VITALS — Ht 69.0 in | Wt 189.0 lb

## 2023-01-04 DIAGNOSIS — E612 Magnesium deficiency: Principal | ICD-10-CM

## 2023-01-04 DIAGNOSIS — Z944 Liver transplant status: Principal | ICD-10-CM

## 2023-01-04 DIAGNOSIS — Z5181 Encounter for therapeutic drug level monitoring: Principal | ICD-10-CM

## 2023-01-04 DIAGNOSIS — Z0000 Encounter for general adult medical examination without abnormal findings: Secondary | ICD-10-CM | POA: Diagnosis not present

## 2023-01-04 LAB — TACROLIMUS LEVEL: TACROLIMUS BLOOD: 7.7 ng/mL (ref 2.0–20.0)

## 2023-01-04 NOTE — Patient Instructions (Signed)
Mr. Lake , Thank you for taking time to come for your Medicare Wellness Visit. I appreciate your ongoing commitment to your health goals. Please review the following plan we discussed and let me know if I can assist you in the future.   Referrals/Orders/Follow-Ups/Clinician Recommendations: none  This is a list of the screening recommended for you and due dates:  Health Maintenance  Topic Date Due   Eye exam for diabetics  01/09/2021   Yearly kidney health urinalysis for diabetes  11/18/2021   COVID-19 Vaccine (5 - 2023-24 season) 10/25/2022   Complete foot exam   01/02/2023   Flu Shot  05/24/2023*   Hemoglobin A1C  02/17/2023   Yearly kidney function blood test for diabetes  11/12/2023   Medicare Annual Wellness Visit  01/04/2024   DTaP/Tdap/Td vaccine (4 - Td or Tdap) 11/09/2029   Colon Cancer Screening  09/08/2032   Hepatitis C Screening  Completed   HIV Screening  Completed   Zoster (Shingles) Vaccine  Completed   HPV Vaccine  Aged Out  *Topic was postponed. The date shown is not the original due date.    Advanced directives: (Copy Requested) Please bring a copy of your health care power of attorney and living will to the office to be added to your chart at your convenience.  Next Medicare Annual Wellness Visit scheduled for next year: Yes 01/05/24 @ 8:50am telephone

## 2023-01-04 NOTE — Progress Notes (Signed)
Subjective:   [name] is a 62 y.o. male who presents for Medicare Annual/Subsequent preventive examination.  Visit Complete: Virtual I connected with  [name] on 01/04/23 by a audio enabled telemedicine application and verified that I am speaking with the correct person using two identifiers.  Patient Location: Home  Provider Location: Home Office  I discussed the limitations of evaluation and management by telemedicine. The patient expressed understanding and agreed to proceed.  Vital Signs: Because this visit was a virtual/telehealth visit, some criteria may be missing or patient reported. Any vitals not documented were not able to be obtained and vitals that have been documented are patient reported.  Patient Medicare AWV questionnaire was completed by the patient on 12/31/22; I have confirmed that all information answered by patient is correct and no changes since this date. Cardiac Risk Factors include: advanced age (>7men, >5 women);diabetes mellitus;dyslipidemia;sedentary lifestyle;male gender;hypertension    Objective:    Today's Vitals   01/04/23 0917  Weight: 189 lb (85.7 kg)  Height: 5\' 9"  (1.753 m)   Body mass index is 27.91 kg/m.     01/04/2023    9:31 AM 09/22/2022   10:43 AM 09/21/2022    3:24 AM 08/16/2022    6:01 PM 12/31/2021    9:22 AM 04/03/2021    7:00 AM 04/01/2021   11:03 AM  Advanced Directives  Does Patient Have a Medical Advance Directive? Yes No No No No No No  Type of Estate agent of [place];Living will        Copy of Healthcare Power of Attorney in Chart? No - copy requested        Would patient like information on creating a medical advance directive?  No - Patient declined  No - Patient declined No - Patient declined No - Patient declined No - Patient declined    Current Medications (verified) Outpatient Encounter Medications as of 01/04/2023  Medication Sig   acetaminophen (TYLENOL) 500 MG tablet  Take 500 mg by mouth every 6 (six) hours as needed for moderate pain or headache.   albuterol (VENTOLIN HFA) 108 (90 Base) MCG/ACT inhaler Inhale 2 puffs into the lungs every 6 (six) hours as needed for wheezing or shortness of breath.   alendronate (FOSAMAX) 70 MG tablet Take 1 tablet (70 mg total) by mouth every Sunday. Take with a full glass of water on an empty stomach. (Patient not taking: Reported on 11/12/2022)   amoxicillin-clavulanate (AUGMENTIN) 875-125 MG tablet Take 1 tablet by mouth 2 (two) times daily.   aspirin EC 325 MG EC tablet Take 1 tablet (325 mg total) by mouth daily.   azelastine (ASTELIN) 0.1 % nasal spray PLACE 2 SPRAYS INTO BOTH NOSTRILS TWICE DAILY AS NEEDED   calcium carbonate (TUMS - DOSED IN MG ELEMENTAL CALCIUM) 500 MG chewable tablet Chew 1 tablet by mouth daily.   Cholecalciferol (VITAMIN D) 50 MCG (2000 UT) tablet Take 2,000 Units by mouth daily.   doxycycline (VIBRA-TABS) 100 MG tablet Take 1 tablet (100 mg total) by mouth 2 (two) times daily. (Patient not taking: Reported on 01/04/2023)   empagliflozin (JARDIANCE) 25 MG TABS tablet Take 1 tablet (25 mg total) by mouth daily before breakfast.   fluticasone (FLONASE) 50 MCG/ACT nasal spray PLACE ONE OR TWO SPRAYS INTO BOTH NOSTRILS DAILY AS NEEDED.   hydrocortisone cream 1 % Apply to affected area 2 times daily (Patient not taking: Reported on 01/04/2023)   insulin lispro (HUMALOG) 100 UNIT/ML KwikPen Inject  14-20 Units into the skin 3 (three) times daily. (Patient not taking: Reported on 01/04/2023)   Insulin NPH, Human,, Isophane, (HUMULIN N) 100 UNIT/ML Kiwkpen Inject 24-26 Units into the skin daily. (Patient not taking: Reported on 01/04/2023)   levothyroxine (SYNTHROID) 175 MCG tablet Take 1 tablet (175 mcg total) by mouth daily before breakfast.   metoprolol tartrate (LOPRESSOR) 25 MG tablet Take 1 tablet (25 mg total) by mouth two (2) times a day.   NON FORMULARY Pt uses a cpap nightly   ondansetron (ZOFRAN) 4  MG tablet Take 1 tablet (4 mg total) by mouth every 6 (six) hours. (Patient not taking: Reported on 01/04/2023)   OneTouch Delica Lancets 33G MISC USE TO CHECK SUGAR DAILY   ONETOUCH ULTRA test strip USE TO CHECK SUGAR DAILY   pioglitazone (ACTOS) 15 MG tablet Take 2 tablets by mouth daily.   pravastatin (PRAVACHOL) 40 MG tablet Take 1 tablet (40 mg total) by mouth every evening.   predniSONE (DELTASONE) 10 MG tablet Take 4 tablets (40 mg total) by mouth daily with breakfast. (Patient not taking: Reported on 01/04/2023)   sertraline (ZOLOFT) 50 MG tablet Take 1 tablet (50 mg total) by mouth daily.   tacrolimus (PROGRAF) 1 MG capsule Take 3 capsules (3 mg total) by mouth 2 (two) times daily.   No facility-administered encounter medications on file as of 01/04/2023.    Allergies (verified) Mycophenolate mofetil and Watermelon flavor   History: Past Medical History:  Diagnosis Date   Allergy    Asthma    Bicuspid aortic valve    sees dr Tenny Craw   Cirrhosis [facility])    Coronary artery disease    DM2 (diabetes mellitus, type 2) (HCC)    Fatty liver    with h/o elevated LFT's   GERD (gastroesophageal reflux disease)    Heart murmur    Hypertension    Itching    all over last few months   Jaundice    Liver transplant recipient (HCC)    09/16/2018 at [facility]   Migraine with aura    OSA (obstructive sleep apnea) 09/14/2011   PSG 11/08/11>>AHI 31.6, SpO2 low 85%. wears CPAP, pt does not know settings   Pulmonary embolism (HCC)    2022   Sleep apnea    Thyroid disease    Past Surgical History:  Procedure Laterality Date   AORTIC VALVE REPLACEMENT N/A 04/03/2021   Procedure: AORTIC VALVE REPLACEMENT (AVR) USING 27 MM INSPIRIS RESILIA  AORTIC VALVE;  Surgeon: Alleen Borne, MD;  Location: [facility] OR;  Service: Open Heart Surgery;  Laterality: N/A;   APPENDECTOMY  11/2007   Emergency   BIOPSY  09/22/2022   Procedure: BIOPSY;  Surgeon: Imogene Burn, MD;  Location: [facility] ENDOSCOPY;  Service:  Gastroenterology;;   BIOPSY THYROID  08/19/2007   Attempted, no tissue obtained   CARDIAC CATHETERIZATION  03/21/2018   CARDIAC VALVE REPLACEMENT     Feb 2023   CARDIOVASCULAR STRESS TEST  04/2005   Negative 06/05   CHOLECYSTECTOMY     CORONARY ARTERY BYPASS GRAFT N/A 04/03/2021   Procedure: CORONARY ARTERY BYPASS GRAFTING (CABG) x ONE ON CARDIOPULMONARY BYPASS. LIMA TO LAD;  Surgeon: Alleen Borne, MD;  Location: [facility] OR;  Service: Open Heart Surgery;  Laterality: N/A;   DOPPLER ECHOCARDIOGRAPHY  07/2002   ESOPHAGEAL BANDING N/A 05/04/2016   Procedure: ESOPHAGEAL BANDING;  Surgeon: Iva Boop, MD;  Location: [facility] ENDOSCOPY;  Service: Endoscopy;  Laterality: N/A;   ESOPHAGOGASTRODUODENOSCOPY (EGD) WITH  PROPOFOL N/A 05/04/2016   Procedure: ESOPHAGOGASTRODUODENOSCOPY (EGD) WITH PROPOFOL;  Surgeon: Iva Boop, MD;  Location: [facility] ENDOSCOPY;  Service: Endoscopy;  Laterality: N/A;   ESOPHAGOGASTRODUODENOSCOPY (EGD) WITH PROPOFOL N/A 09/22/2022   Procedure: ESOPHAGOGASTRODUODENOSCOPY (EGD) WITH PROPOFOL;  Surgeon: Imogene Burn, MD;  Location: [facility] ENDOSCOPY;  Service: Gastroenterology;  Laterality: N/A;   FLEXIBLE SIGMOIDOSCOPY N/A 09/22/2022   Procedure: FLEXIBLE SIGMOIDOSCOPY;  Surgeon: Imogene Burn, MD;  Location: [facility] ENDOSCOPY;  Service: Gastroenterology;  Laterality: N/A;   LIVER TRANSPLANT     09/16/2018 at [facility]   RIGHT/LEFT HEART CATH AND CORONARY ANGIOGRAPHY N/A 12/20/2020   Procedure: RIGHT/LEFT HEART CATH AND CORONARY ANGIOGRAPHY;  Surgeon: Kathleene Hazel, MD;  Location: [facility] INVASIVE CV LAB;  Service: Cardiovascular;  Laterality: N/A;   TEE WITHOUT CARDIOVERSION N/A 04/03/2021   Procedure: TRANSESOPHAGEAL ECHOCARDIOGRAM (TEE);  Surgeon: Alleen Borne, MD;  Location: [facility] OR;  Service: Open Heart Surgery;  Laterality: N/A;   Family History  Problem Relation Age of Onset   Asthma Mother    Cancer Father        Died when pt was 21 of CA with mets, site unknown, COPD   COPD  Father    Heart disease Sister        Heart stopped   Liver disease Sister        "gene" for liver disease   Colon cancer Maternal Grandmother    Heart disease Maternal Grandfather        MI, 15 YOA   Prostate cancer Maternal Uncle    Esophageal cancer Neg Hx    Rectal cancer Neg Hx    Stomach cancer Neg Hx    Social History   Socioeconomic History   Marital status: Legally Separated    Spouse name: Not on file   Number of children: 1   Years of education: 12   Highest education level: 12th grade  Occupational History   Occupation: Youth worker: [employer]    Comment: Manages "front" and writes orders --in [place]   Occupation: Disabled  Tobacco Use   Smoking status: Never    Passive exposure: Past   Smokeless tobacco: Never  Vaping Use   Vaping status: Never Used  Substance and Sexual Activity   Alcohol use: No   Drug use: No   Sexual activity: Not on file  Other Topics Concern   Not on file  Social History Narrative   Separated   1 child and 2 stepchildren   Prev ran a car shop   Social Determinants of Health   Financial Resource Strain: Medium Risk (01/04/2023)   Overall Financial Resource Strain (CARDIA)    Difficulty of Paying Living Expenses: Somewhat hard  Food Insecurity: No Food Insecurity (01/04/2023)   Hunger Vital Sign    Worried About Running Out of Food in the Last Year: Never true    Ran Out of Food in the Last Year: Never true  Transportation Needs: No Transportation Needs (01/04/2023)   PRAPARE - Administrator, Civil Service (Medical): No    Lack of Transportation (Non-Medical): No  Physical Activity: Insufficiently Active (01/04/2023)   Exercise Vital Sign    Days of Exercise per Week: 1 day    Minutes of Exercise per Session: 10 min  Stress: No Stress Concern Present (01/04/2023)   [employer] of Occupational Health - Occupational Stress Questionnaire    Feeling of Stress : Only a little  Social  Connections:  Moderately Integrated (01/04/2023)   Social Connection and Isolation Panel [NHANES]    Frequency of Communication with Friends and Family: More than three times a week    Frequency of Social Gatherings with Friends and Family: Twice a week    Attends Religious Services: More than 4 times per year    Active Member of [employer] or Organizations: Yes    Attends Banker Meetings: 1 to 4 times per year    Marital Status: Separated    Tobacco Counseling Counseling given: Not Answered   Clinical Intake:  Pre-visit preparation completed: No  Pain : No/denies pain   BMI - recorded: 27.91 Nutritional Status: BMI 25 -29 Overweight Nutritional Risks: None Diabetes: Yes CBG done?: No CBG resulted in Enter/ Edit results?: No Did pt. bring in CBG monitor from home?: No  How often do you need to have someone help you when you read instructions, pamphlets, or other written materials from your doctor or pharmacy?: 2 - Rarely  Interpreter Needed?: No  Comments: lives alone Information entered by :: B.Coben Godshall,LPN   Activities of Daily Living    12/31/2022    9:20 AM 09/22/2022    8:46 AM  In your present state of health, do you have any difficulty performing the following activities:  Hearing? 0 0  Vision? 0 0  Difficulty concentrating or making decisions? 1 0  Walking or climbing stairs? 0 0  Dressing or bathing? 0 0  Doing errands, shopping? 0 0  Preparing Food and eating ? N   Using the Toilet? N   In the past six months, have you accidently leaked urine? N   Do you have problems with loss of bowel control? N   Managing your Medications? N   Managing your Finances? N   Housekeeping or managing your Housekeeping? N     Patient Care Team: Joaquim Nam, MD as PCP - Vita Erm, MD as PCP - Cardiology (Cardiology) [facility], [facility]  Indicate any recent Medical Services you may have received from other than [facility] providers in the past  year (date may be approximate).     Assessment:   This is a routine wellness examination for [place].  Hearing/Vision screen Hearing Screening - Comments:: Pt says hearing not as good as use to be but ok for now Vision Screening - Comments:: Pt says his vision is good other than readers Dr Marcellus Scott   Goals Addressed             This Visit's Progress    DIET - EAT MORE FRUITS AND VEGETABLES   Not on track      Depression Screen    01/04/2023    9:29 AM 11/12/2022    2:07 PM 10/09/2022   11:56 AM 08/18/2022    2:09 PM 04/29/2022   11:38 AM 12/31/2021    9:21 AM 08/27/2021   10:13 AM  PHQ 2/9 Scores  PHQ - 2 Score 0 0 0 1 0 0 0  PHQ- 9 Score  3 3 4 2  0     Fall Risk    12/31/2022    9:20 AM 11/12/2022    2:07 PM 08/18/2022    2:09 PM 04/29/2022   11:37 AM 12/31/2021    9:23 AM  Fall Risk   Falls in the past year? 1 1 0 0 0  Number falls in past yr: 1 1 0 0 0  Injury with Fall? 0 0 0 0 0  Risk for fall due to : History of fall(s) No Fall Risks No Fall Risks No Fall Risks No Fall Risks  Follow up Education provided;Falls prevention discussed Falls evaluation completed Falls evaluation completed Falls evaluation completed Falls prevention discussed;Falls evaluation completed    MEDICARE RISK AT HOME: Medicare Risk at Home Any stairs in or around the home?: Yes If so, are there any without handrails?: Yes Home free of loose throw rugs in walkways, pet beds, electrical cords, etc?: Yes Adequate lighting in your home to reduce risk of falls?: Yes Life alert?: No Use of a cane, walker or w/c?: No Grab bars in the bathroom?: Yes Shower chair or bench in shower?: No Elevated toilet seat or a handicapped toilet?: Yes  TIMED UP AND GO:  Was the test performed?  No    Cognitive Function:        01/04/2023    9:32 AM 12/31/2021    9:24 AM  6CIT Screen  What Year? 0 points 0 points  What month? 0 points 0 points  What time? 0 points 0 points  Count back from 20 0  points 0 points  Months in reverse 0 points 0 points  Repeat phrase 0 points 0 points  Total Score 0 points 0 points    Immunizations Immunization History  Administered Date(s) Administered   Hepatitis A, Adult 08/28/2021, 09/15/2022   Hepb-cpg 08/28/2021, 09/15/2022   Influenza Split 11/18/2011   Influenza Whole 12/27/2007, 10/29/2009   Influenza,inj,Quad PF,6+ Mos 01/25/2014, 02/11/2015, 01/19/2017, 01/11/2018, 10/28/2018, 11/10/2019, 11/18/2020, 01/01/2022   Influenza-Unspecified 01/11/2018, 11/23/2018   PFIZER(Purple Top)SARS-COV-2 Vaccination 08/31/2019, 09/21/2019, 09/17/2020   Pfizer Covid Bivalent Pediatric Vaccine(71mos to <23yrs) 03/10/2021   Pneumococcal Conjugate-13 04/17/2019   Pneumococcal Polysaccharide-23 01/11/2018   Td 03/05/1997, 10/09/2008   Tdap 11/10/2019   Zoster Recombinant(Shingrix) 09/17/2020, 02/03/2021    TDAP status: Up to date  Flu Vaccine status: Due, Education has been provided regarding the importance of this vaccine. Advised may receive this vaccine at local pharmacy or Health Dept. Aware to provide a copy of the vaccination record if obtained from local pharmacy or Health Dept. Verbalized acceptance and understanding.  Pneumococcal vaccine status: Up to date  Covid-19 vaccine status: Completed vaccines  Qualifies for Shingles Vaccine? Yes   Zostavax completed Yes   Shingrix Completed?: Yes  Screening Tests Health Maintenance  Topic Date Due   OPHTHALMOLOGY EXAM  01/09/2021   Diabetic kidney evaluation - Urine ACR  11/18/2021   COVID-19 Vaccine (5 - 2023-24 season) 10/25/2022   FOOT EXAM  01/02/2023   INFLUENZA VACCINE  05/24/2023 (Originally 09/24/2022)   HEMOGLOBIN A1C  02/17/2023   Diabetic kidney evaluation - eGFR measurement  11/12/2023   Medicare Annual Wellness (AWV)  01/04/2024   DTaP/Tdap/Td (4 - Td or Tdap) 11/09/2029   Colonoscopy  09/08/2032   Hepatitis C Screening  Completed   HIV Screening  Completed   Zoster Vaccines-  Shingrix  Completed   HPV VACCINES  Aged Out    Health Maintenance  Health Maintenance Due  Topic Date Due   OPHTHALMOLOGY EXAM  01/09/2021   Diabetic kidney evaluation - Urine ACR  11/18/2021   COVID-19 Vaccine (5 - 2023-24 season) 10/25/2022   FOOT EXAM  01/02/2023    Colorectal cancer screening: Type of screening: Colonoscopy. Completed 09/09/22. Repeat every 10 years  Lung Cancer Screening: (Low Dose CT Chest recommended if Age 13-80 years, 20 pack-year currently smoking OR have quit w/in 15years.) does not qualify.   Lung Cancer Screening  Referral: no  Additional Screening:  Hepatitis C Screening: does not qualify; Completed 04/09/2016  Vision Screening: Recommended annual ophthalmology exams for early detection of glaucoma and other disorders of the eye. Is the patient up to date with their annual eye exam?  Yes  Who is the provider or what is the name of the office in which the patient attends annual eye exams? Dr Jean Rosenthal If pt is not established with a provider, would they like to be referred to a provider to establish care? No .   Dental Screening: Recommended annual dental exams for proper oral hygiene  Diabetic Foot Exam: Diabetic Foot Exam: Completed 02/03/2022  Community Resource Referral / Chronic Care Management: CRR required this visit?  No   CCM required this visit?  No     Plan:     I have personally reviewed and noted the following in the patient's chart:   Medical and social history Use of alcohol, tobacco or illicit drugs  Current medications and supplements including opioid prescriptions. Patient is not currently taking opioid prescriptions. Functional ability and status Nutritional status Physical activity Advanced directives List of other physicians Hospitalizations, surgeries, and ER visits in previous 12 months Vitals Screenings to include cognitive, depression, and falls Referrals and appointments  In addition, I have reviewed and  discussed with patient certain preventive protocols, quality metrics, and best practice recommendations. A written personalized care plan for preventive services as well as general preventive health recommendations were provided to patient.     Sue Lush, LPN   [id number]   After Visit Summary: (MyChart) Due to this being a telephonic visit, the after visit summary with patients personalized plan was offered to patient via MyChart   Nurse Notes: The patient states he is doing well and has no concerns or questions at this time.

## 2023-01-05 ENCOUNTER — Encounter: Payer: Self-pay | Admitting: Family Medicine

## 2023-01-05 ENCOUNTER — Ambulatory Visit (INDEPENDENT_AMBULATORY_CARE_PROVIDER_SITE_OTHER): Payer: Medicare Other | Admitting: Family Medicine

## 2023-01-05 ENCOUNTER — Ambulatory Visit
Admission: RE | Admit: 2023-01-05 | Discharge: 2023-01-05 | Disposition: A | Discharge status: Home / Self Care | Payer: Medicare Other | Source: Ambulatory Visit | Attending: Family Medicine | Admitting: Family Medicine

## 2023-01-05 VITALS — BP 140/70 | HR 66 | Temp 97.9°F | Ht 69.0 in | Wt 191.4 lb

## 2023-01-05 DIAGNOSIS — J189 Pneumonia, unspecified organism: Secondary | ICD-10-CM

## 2023-01-05 DIAGNOSIS — R202 Paresthesia of skin: Secondary | ICD-10-CM

## 2023-01-05 DIAGNOSIS — Z23 Encounter for immunization: Secondary | ICD-10-CM | POA: Diagnosis not present

## 2023-01-05 MED ORDER — PIOGLITAZONE HCL 30 MG PO TABS: 30.0000 mg | ORAL_TABLET | Freq: Every day | ORAL | Status: DC

## 2023-01-05 NOTE — Progress Notes (Unsigned)
Left thumb tingling occurred about 1 month ago.  "It started like I hit my funny bone" but he didn't hit his elbow. No trauma.  No clear trigger recalled. Prev with some change in sensation up the L 1st ray but mainly in the L distal thumb.  Prev with minimal change in sensation on the thumb side of the 2nd finger.  Other fingers feel normal.  No change in R hand. He didn't have typical radicular sx or changes with neck ROM. Normal grip.  L handed.   D/w pt about f/u CRX.  To be done today.  Minimal cough, is better.  Breathing is better.  No SABA use usually.  Rare sputum.  No fevers.  Previous two chest x-rays reviewed with patient.  Med list updated.  He is off alendronate until he has he next DXA.    Meds, vitals, and allergies reviewed.   ROS: Per HPI unless specifically indicated in ROS section   Nad Ncat Neck supple no LA Rrr Ctab Abdomen soft. Altered sensation to light touch and monofilament on the L thumb and proximally up the L 1st ray and forearm.   Small subungual hematoma L thumb w/o recalled trauma.  Normal BUE and hand strength and BUE DTRs.   L>R tinel positive at the wrist.  Normal neck ROM.  Normal radial pulses B

## 2023-01-05 NOTE — Patient Instructions (Signed)
Go to the lab on the way out.   If you have mychart we'll likely use that to update you.    Try a carpal tunnel wrist brace and let me know if that isn't helping.  Take care.  Glad to see you.

## 2023-01-06 ENCOUNTER — Telehealth: Payer: Self-pay | Admitting: Family Medicine

## 2023-01-06 ENCOUNTER — Telehealth: Admit: 2023-01-06 | Discharge: 2023-01-07 | Payer: MEDICARE | Attending: Ambulatory Care | Primary: Ambulatory Care

## 2023-01-06 DIAGNOSIS — R202 Paresthesia of skin: Secondary | ICD-10-CM | POA: Insufficient documentation

## 2023-01-06 NOTE — Assessment & Plan Note (Signed)
History of.  Lungs are clear.  See notes on follow-up x-ray.

## 2023-01-06 NOTE — Telephone Encounter (Signed)
Please update patient.  I am awaiting his radiology overread but it looks like his chest x-ray has improved from prior and that is good news.  Please let me know if he does not improve with bracing his wrist or if he has other symptoms.  Thanks.

## 2023-01-06 NOTE — Unmapped (Signed)
 [facility] at [facility]  [address]  [place], [place] [address]    Clinical Pharmacist Visit Summary    The patient reports they are physically located in [place] and is currently: at home. I conducted a audio/video visit. I spent 50m 54s on the video call with the patient. I spent an additional 30 minutes on pre- and post-visit activities on the date of service .     Assessment and Plan:   1. Diabetes, post-transplant: fair control.  Last A1c = 7.4% on 09/15/2022 with goal <7% without hypoglycemia.   Freestyle Libre 3 CGM data indicates improved glycemic control: ABG: 146 mg/dL, GMI 6.0%, TIR 45%, low/very low 0%. Believe this is due to continued medication adherence and finishing the steroid taper for PNA. Endorses symptoms of hypoglycemia at least once daily throughout the day (shakiness/weakness) and overnight lows (50-60s) verified by [facility].  Takes Jardiance 25 mg daily and pioglitazone 30 mg daily with good adherence and without affordability concerns (gets Jardiance via [employer] and is due to renew for 2025). Insulin is still on HOLD due to consistent lows after stopping steroids. [employer] [facility] did not call patient regarding Tradjenta 5 mg so patient has not started. Diet is mostly unchanged, but patient has incorporated walking into his physical activity routine since last CPP visit.   Congratulated patient on this improvement in his BS and achieving TIR >70%. Given symptoms of hypoglycemia, will decrease his pioglitazone dose by 50% and continue holding the insulins. Will continue Jardiance at the same dose, especially with his history of CAD. If approved and desired, could add or transition pioglitazone to Tradjenta 5 mg daily, as he is unable to tolerate Ozempic and metformin. Will resend prescription for Tradjenta to [facility] today.   DECREASE pioglitazone to 15 mg PO daily (take half of 30 mg tablet).  Continue Jardiance 25 mg PO daily.  Prescription for Tradjenta 5 mg PO daily sent to [facility].  Reviewed signs/symptoms/treatment of hypoglycemia. Continue Freestyle Libre 3 CGM. Encouraged patient to check [facility] if experiencing any shakiness/weakness during the day (similar to what he does at night) when feeling low.   Follow up with clinical pharmacist via video visit in January 2025 (message sent to schedulers).  Follow up with Dr. Beryle Flock MD on 03/29/2023.   Follow up with MAP Class to re-enroll into [employer] (message sent to schedulers).     Elinor Dodge, PharmD  PGY1 Pharmacy Resident    I have reviewed the resident's note and agree with the assessment and plan.     Saddie Benders?? PharmD, CPP, BCACP, CDCES  Clinical Pharmacist Practitioner - Endocrinology    I spent a total of  36 minutes 54 seconds  on audio-video with the patient delivering clinical care and providing education/counseling.     Subjective:   Reason for visit: Follow up with Clinical Pharmacist Practitioner for blood glucose review and adjustment of diabetes regimen as needed.    Anthony Alvarado is a 62 y.o. year old male with a history of diabetes post-transplant who presents today for a diabetes-related visit. PMH includes liver transplant (2022), hepatic steatosis, obesity, hypothyroidism, HTN, and CAD s/p CABG (2023)    Diabetes Provider and Last Visit Date: Saddie Benders?? PharmD, CPP on 11/18/22    At Last Visit:   Patient was on steroid taper for PNA that stopped on 11/25/22. Due to steroid induced hyperglycemia, the following recommendations were made.   INCREASE Insulin NPH to  24 units once daily.  Continue Humalog per sliding scale below:  Breakfast         Lunch              Dinner  Below 150:      10                    14                    14   151-200:          12                    16                    16   201-250:          14                    18                    18   >251:               14                    20                    20   Continue Jardiance 25 mg PO daily.  Continue pioglitazone 30 mg PO daily.    On 10/9, clinical pharmacist did a follow up call with the patient since prednisone taper was completed. Patient had some consistent lows so instructions were as follows:     START Tradjenta 5 mg PO daily  Prescription faxed to [facility] today  Continue Jardiance 25 mg PO daily  Continue pioglitazone 30 mg PO daily  Continue to HOLD NPH and Humalog insulins    Interval History of Present Illness:     Today: Patient presents virtually for diabetes follow up. He reports adherence to Jardiance 25 mg daily and pioglitazone 30 mg daily. Has not been taking any insulins as directed. Never received a call from [facility] for the Tradjenta, so has not started therapy. Patient previously reported GI distress due to Ozempic and metformin, but that is completely resolved since stopping the medication and workup by GI. Patient completed antibiotic and steroid treatment for PNA in early October and reports feeling significantly better now. Denies affordability concerns (has [employer], getting Jardiance through MAP, will get Tradjenta through MAP). Continues to use Freestyle Libre 3 at home without issue. Does experience symptoms of hypoglycemia (shakiness/weak) throughout the daytime but Freestyle does not report low BS or BS < 70 mg/dL. Does not check BS with a glucometer during the day. Has awoken from alarms indicating BG <70 mg/dl overnight and checks with a fingerstick to confirm true lows. Diet and exercise as documented below.  Discussed the importance of medication adherence, diet, and exercise to improve glycemic control. He had no additional questions or concerns.    Current Medications: Reports adherence to the following diabetes medications:   Jardiance 25 mg daily - taking   Pioglitazone 30 mg daily - taking   Tradjenta 5 mg daily - not taking; did not receive from [facility]    Previous Medications:   Ozempic 0.25 mg once weekly - stopped on 08/01/2022 (GI intolerance)  Metformin XR 1000 mg BID  - stopped on 09/02/2022 (GI  intolerance)    Diet (Typical):  Breakfast (8AM): Cereal, eggs and sausage and bacon  Lunch (11-12) : Fast food (Hamburgers/fries), grilled cheese sandwich   Dinner (4-6 PM): Vegetables (lima/pinto beans/corn/ turnip greens), spaghetti, brown rice, tacos, hotdogs  Snacks (usually at night): Pretzels, chips, chocolate chip cookies. Cheeze-its. Ice cream  Beverages: Water, diet Pepsi, sugar free liquid IV   01/06/23: No major changes since last CPP visit.     Exercise: Denies regular physical activity, pt has been sick all throughout the summer and has SOB d/t PNA currently. Would like to start walking more often once he feels better.  01/06/23: Started to incorporate walking, 15-20 minutes at least once weekly    Social History:   Tobacco use: no  Illicit drug use use: no  Alcohol use: no  Occupation: Unemployed; disability   Household: Lives alone    POC glucose level today of N/A mg/dL - virtual visit. CGM read 205 mg/dL during our visit which was ~ 30 minutes after a meal. Before the meal, the BS was 127 mg/dL.     Glucose Monitoring: Freestyle Libre CGM               Hypoglycemia:    Symptoms of hypoglycemia since last visit:  yes;  feels shaky/weak at least once daily during the day. Does not check BG with glucometer at this time. Will wake to alerts overnight for low BS. Checks fingerstick if this occurs and it always confirms a true low. Also sweats a lot overnight, but reported this is nothing new.   If no recent hypoglycemia, prior recognition of hypoglycemia symptoms and knowledge of treatment: n/a   Treats with:  glucose tablets and hard candy    CARDIOVASCULAR RISK REDUCTION  History of clinical ASCVD? yes, CAD s/p CABG 2023  History of heart failure? no  History of hyperlipidemia? yes  Taking statin? yes, pravastatin 40 mg daily  Taking aspirin? yes  Taking SGLT-2i and/or GLP- 1 RA? yes, Jardiance 25 mg daily, unable to tolerate Ozempic with GI issues.     BLOOD PRESSURE CONTROL  History of hypertension?: yes  Taking ACEi/ARB? no    KIDNEY CARE  History of Chronic Kidney Disease? no  History of albuminuria? no  Taking SGLT-2i and/or GLP- 1 RA? As above  Taking ACEi/ARB? As above      Current Outpatient Medications:     amoxicillin-clavulanate (AUGMENTIN) 875-125 mg per tablet, Take 1 mg by mouth Two (2) times a day (at 8am and 12:00)., Disp: , Rfl:     aspirin 325 MG tablet, Take 1 tablet (325 mg total) by mouth daily., Disp: , Rfl:     azelastine (ASTELIN) 137 mcg (0.1 %) nasal spray, 1 spray into each nostril daily as needed., Disp: , Rfl:     blood sugar diagnostic (ONETOUCH ULTRA TEST) Strp, USE TO CHECK SUGAR DAILY, Disp: , Rfl:     blood-glucose sensor (FREESTYLE LIBRE 3 SENSOR) Devi, For use as directed. change every 14 days., Disp: 3 each, Rfl: 11    calcium carbonate (TUMS) 200 mg calcium (500 mg) chewable tablet, Chew 1 tablet (500 mg total) daily. Pt reports taking every other day., Disp: , Rfl:     doxycycline (VIBRA-TABS) 100 MG tablet, Take 1 tablet (100 mg total) by mouth., Disp: , Rfl:     empagliflozin (JARDIANCE) 25 mg tablet, Take 1 tablet (25 mg total) by mouth daily., Disp: , Rfl:     fluticasone propionate (FLONASE) 50 mcg/actuation nasal  spray, PLACE ONE OR TWO SPRAYS INTO BOTH NOSTRILS DAILY AS NEEDED., Disp: , Rfl:     glucose 4 GM chewable tablet, Chew 4 tablets (16 g total) every ten (10) minutes as needed for low blood sugar ((For Blood Glucose LESS than 70 mg/dL and GREATER than or EQUAL to 54 mg/dL and able to take by mouth.))., Disp: 50 tablet, Rfl: 12    glycerin/witch hazel leaf (HEMORRHOID TOP), Apply topically., Disp: , Rfl:     hydrocortisone 1 % crpe, APPLY TO AFFECTED AREA TWICE A DAY, Disp: , Rfl:     levothyroxine (SYNTHROID) 175 MCG tablet, Take 1 tablet (175 mcg total) by mouth daily before breakfast., Disp: 90 tablet, Rfl: 1    linagliptin (TRADJENTA) 5 mg Tab, Take 1 tablet (5 mg total) by mouth daily., Disp: , Rfl:     metoPROLOL tartrate (LOPRESSOR) 25 MG tablet, Take 1 tablet (25 mg total) by mouth two (2) times a day., Disp: 180 tablet, Rfl: 1    pen needle, diabetic 32 gauge x 5/32 (4 mm) Ndle, Use with insulin up to 4 times/day as needed., Disp: 100 each, Rfl: 1    pioglitazone (ACTOS) 30 MG tablet, Take 1 tablet (30 mg total) by mouth daily., Disp: 90 tablet, Rfl: 3    pravastatin (PRAVACHOL) 40 MG tablet, Take 1 tablet (40 mg total) by mouth every evening., Disp: 30 tablet, Rfl: 1    sertraline (ZOLOFT) 50 MG tablet, Take 1 tablet (50 mg total) by mouth daily., Disp: 90 tablet, Rfl: 3    tacrolimus (PROGRAF) 1 MG capsule, Take 3 capsules (3 mg total) by mouth two (2) times a day., Disp: 180 capsule, Rfl: 11  No current facility-administered medications for this visit.    Facility-Administered Medications Ordered in Other Visits:     diazePAM (VALIUM) 5 mg/mL injection, , , ,     Objective:   Vitals:    There were no vitals filed for this visit.      Past Medical History:    Active Ambulatory Problems     Diagnosis Date Noted    Diabetes mellitus without complication (CMS-HCC) 05/09/2017    Hypothyroidism 08/09/2006    Cirrhosis of liver without ascites (CMS-HCC) 01/10/2018    OSA (obstructive sleep apnea) 09/14/2011    Essential hypertension 08/09/2006    Encounter for pre-transplant evaluation for chronic liver disease 03/10/2018    Pleural effusion 09/09/2018    Pneumothorax after biopsy 09/11/2018    Liver transplant recipient (CMS-HCC) 09/16/2018    Allergic rhinitis 08/09/2006    Arthralgia 10/25/2017    Asthma 09/14/2011    Bicuspid aortic valve 02/25/2011    Flying phobia 09/18/2013    Mucosal abnormality of stomach 09/27/2018    Nephrolithiasis 07/28/2018    Pure hypercholesterolemia 02/14/2007    Umbilical hernia 08/14/2012    Unspecified hearing loss 08/11/2006    COVID-19 03/16/2019    Colitis 09/27/2022    Hemorrhoids, external 10/14/2022    Steatosis of liver 10/19/2022    Steroid-induced hyperglycemia 10/31/2022    Leukocytosis 10/31/2022    Hyponatremia with increased serum osmolality 10/31/2022     Resolved Ambulatory Problems     Diagnosis Date Noted    No Resolved Ambulatory Problems     Past Medical History:   Diagnosis Date    Aortic valve stenosis     Autoimmune cholangitis     Carrier of hemochromatosis HFE gene mutation     Cholestatic cirrhosis (CMS-HCC)     Hepatic  encephalopathy (CMS-HCC)     Hypertension     Other osteoporosis without current pathological fracture     Pneumonia 10/2022    Sleep apnea     Type 2 diabetes mellitus (CMS-HCC)        Wt Readings from Last 3 Encounters:   12/14/22 83.6 kg (184 lb 6.4 oz)   11/20/22 80.7 kg (178 lb)   11/09/22 78.9 kg (174 lb)       Lab Results   Component Value Date    A1C 7.4 (H) 09/15/2022    A1C 7.4 (H) 04/06/2022    A1C 6.6 (H) 09/18/2019    A1C 6.0 (H) 04/17/2019    A1C 5.9 (H) 12/19/2018    A1C 4.7 (L) 10/24/2018    A1C 4.4 (L) 09/15/2018    A1C 5.1 01/04/2018       Lab Results   Component Value Date    NA 139 01/01/2023    K 4.4 01/01/2023    CL 104 01/01/2023    CO2 21 01/01/2023    BUN 21 01/01/2023    CREATININE 1.09 01/01/2023    GLU 121 11/20/2022    CALCIUM 9.3 01/01/2023    ALBUMIN 3.7 11/20/2022    PHOS 3.7 11/20/2022       Lab Results   Component Value Date    ALKPHOS 109 01/01/2023    BILITOT 0.2 01/01/2023    BILIDIR 0.10 01/01/2023    PROT 7.3 01/01/2023    ALBUMIN 3.7 11/20/2022    ALT 16 01/01/2023    AST 19 01/01/2023       No results found for: [facility]    Lab Results   Component Value Date    CHOL 122 04/17/2019    CHOL 114 12/19/2018     Lab Results   Component Value Date    HDL 33 (L) 04/17/2019    HDL 33 (L) 12/19/2018     Lab Results   Component Value Date    LDL 52 (L) 04/17/2019    LDL 32 (L) 12/19/2018    LDL 79.6 03/21/2018     Lab Results   Component Value Date    VLDL 37 04/17/2019    VLDL 78.2 (H) 12/19/2018     Lab Results   Component Value Date    CHOLHDLRATIO 3.7 04/17/2019    CHOLHDLRATIO 3.5 12/19/2018     Lab Results   Component Value Date TRIG 185 (H) 04/17/2019    TRIG 243 (H) 12/19/2018       The 10-year ASCVD risk score (Arnett DK, et al., 2019) is: 19.8%    Values used to calculate the score:      Age: 37 years      Sex: Male      Is Non-Hispanic African American: No      Diabetic: Yes      Tobacco smoker: No      Systolic Blood Pressure: 142 mmHg      Is BP treated: Yes      HDL Cholesterol: 28 mg/dL      Total Cholesterol: 113 mg/dL    Note: For patients with SBP <90 or >200, Total Cholesterol <130 or >320, HDL <20 or >100 which are outside of the allowable range, the calculator will use these upper or lower values to calculate the patient???s risk score.

## 2023-01-06 NOTE — Telephone Encounter (Signed)
Pt aware of Dr. Lianne Bushy message.  Instructed that we will call if anything comes up in radiology's over read.

## 2023-01-06 NOTE — Assessment & Plan Note (Signed)
Localized to the left first finger and thumb side of the left second finger and then proximally.  Not noted on the other fingers of the right hand.  He does not have radicular symptoms from the neck.  Okay for outpatient follow-up.  More likely to be carpal tunnel syndrome.  We could send him for EMGs or we can just have him use a regular carpal tunnel brace in the meantime.  I think it makes sense to brace him instead of sending him for extra testing since he does not have weakness.  He agrees.  Rationale discussed with patient he can update me as needed.

## 2023-01-11 DIAGNOSIS — Z944 Liver transplant status: Principal | ICD-10-CM

## 2023-01-11 DIAGNOSIS — E612 Magnesium deficiency: Principal | ICD-10-CM

## 2023-01-11 DIAGNOSIS — Z5181 Encounter for therapeutic drug level monitoring: Principal | ICD-10-CM

## 2023-01-11 NOTE — Unmapped (Signed)
I was immediately available via phone/pager or present on site.  I reviewed and discussed the case with the fellow, but did not see the patient.  I agree with the assessment and plan as documented in the fellow's note. Trystian Crisanto Jin Avish Torry, MD

## 2023-01-12 NOTE — Unmapped (Signed)
Addended by: Genia Harold on: 01/11/2023 10:37 PM     Modules accepted: Orders

## 2023-01-18 DIAGNOSIS — Z5181 Encounter for therapeutic drug level monitoring: Principal | ICD-10-CM

## 2023-01-18 DIAGNOSIS — Z944 Liver transplant status: Principal | ICD-10-CM

## 2023-01-18 DIAGNOSIS — E612 Magnesium deficiency: Principal | ICD-10-CM

## 2023-01-20 DIAGNOSIS — Z944 Liver transplant status: Principal | ICD-10-CM

## 2023-01-20 NOTE — Unmapped (Signed)
Spoke with patient a couple weeks ago following his 11/8 labs. He denied any fevers or sx of illness at that time, but inquired about when he should repeat a colonoscopy. Discussed this with his GI provider, Dr.Lupu, who felt that the sigmoidoscopy showed he was healing from his colitis, and that he would need a cancer-screening colonoscopy in 1 yr, around the time he has f/up with the patient next year, and that he would place the order as well. Spoke with patient today and relayed this message. He denies any fever/chills but reports he has had a persistent occipital HA. He reports his BPs have been normal. Encouraged him to f/up with his pcp on this, since it should not be accepted as normal. He verbalized understanding.

## 2023-01-25 DIAGNOSIS — E612 Magnesium deficiency: Principal | ICD-10-CM

## 2023-01-25 DIAGNOSIS — Z944 Liver transplant status: Principal | ICD-10-CM

## 2023-01-25 DIAGNOSIS — Z5181 Encounter for therapeutic drug level monitoring: Principal | ICD-10-CM

## 2023-01-27 ENCOUNTER — Other Ambulatory Visit: Payer: Self-pay | Admitting: Family Medicine

## 2023-01-27 MED ORDER — PRAVASTATIN 40 MG TABLET
ORAL_TABLET | Freq: Every evening | ORAL | 1 refills | 30 days
Start: 2023-01-27 — End: ?

## 2023-01-27 NOTE — Unmapped (Signed)
[facility] Refill Coordination Note    Specialty Medication(s) to be Shipped:   Transplant: tacrolimus 1mg     Other medication(s) to be shipped:  [facility] 3 sensor,  Pravastatin     Anthony Alvarado, [place]: 1960/09/21  Phone: [phone] (home)       All above HIPAA information was verified with patient.     Was a Nurse, learning disability used for this call? No    Completed refill call assessment today to schedule patient's medication shipment from the [facility]  [phone]).  All relevant notes have been reviewed.     Specialty medication(s) and dose(s) confirmed: Regimen is correct and unchanged.   Changes to medications: Luc reports no changes at this time.  Changes to insurance: No  New side effects reported not previously addressed with a pharmacist or physician: None reported  Questions for the pharmacist: No    Confirmed patient received a Conservation officer, historic buildings and a Surveyor, mining with first shipment. The patient will receive a drug information handout for each medication shipped and additional FDA Medication Guides as required.       DISEASE/MEDICATION-SPECIFIC INFORMATION        N/A    SPECIALTY MEDICATION ADHERENCE              Were doses missed due to medication being on hold? No    tacrolimus 1 MG capsule (PROGRAF)  : 7 days of medicine on hand        REFERRAL TO PHARMACIST     Referral to the pharmacist: Not needed      [facility]     Shipping address confirmed in Epic.       Delivery Scheduled: Yes, Expected medication delivery date: 02/01/23.     Medication will be delivered via UPS to the prescription address in Epic WAM.    [facility]  Specialty Technician

## 2023-01-28 ENCOUNTER — Ambulatory Visit: Admit: 2023-01-28 | Discharge: 2023-01-29 | Payer: MEDICARE

## 2023-01-28 DIAGNOSIS — E1165 Type 2 diabetes mellitus with hyperglycemia: Secondary | ICD-10-CM | POA: Diagnosis not present

## 2023-01-28 MED ORDER — PRAVASTATIN 40 MG TABLET
ORAL_TABLET | Freq: Every evening | ORAL | 2 refills | 30 days
Start: 2023-01-28 — End: ?

## 2023-01-29 ENCOUNTER — Encounter: Payer: Self-pay | Admitting: Family Medicine

## 2023-01-29 MED FILL — TACROLIMUS 1 MG CAPSULE, IMMEDIATE-RELEASE: ORAL | 30 days supply | Qty: 180 | Fill #3

## 2023-01-29 MED FILL — PRAVASTATIN 40 MG TABLET: ORAL | 30 days supply | Qty: 30 | Fill #0

## 2023-01-29 MED FILL — FREESTYLE LIBRE 3 PLUS SENSOR DEVICE: 28 days supply | Qty: 2 | Fill #2

## 2023-01-29 NOTE — Telephone Encounter (Signed)
 Care team updated and letter sent for eye exam notes.

## 2023-01-29 NOTE — Unmapped (Signed)
Nurse Clinic Visit    Time Spent with Patient: 10 mins  Time Spent Pre/Post Charting:    Patient seen for MAP application through [facility] for Jardiance.    Patient came to clinic with bank statements, insurance card, and previous year's tax documents. Copies made of all documents and originals returned to patient. Confirmed patient section of application completed and signed. Confirmed provider section of application completed.     Instructed patient that it can take up to 10 business days for provider to sign paperwork and then an additional 7 business days for [facility] to process application.    Instructed patient to contact [employer]  to follow up on status of application. Educated patient that application needs to be renewed every year.    Provider section forwarded to Dr. Dell Ponto for signature.     All questions answered, patient verbalized understanding.

## 2023-02-01 DIAGNOSIS — Z5181 Encounter for therapeutic drug level monitoring: Principal | ICD-10-CM

## 2023-02-01 DIAGNOSIS — Z944 Liver transplant status: Principal | ICD-10-CM

## 2023-02-01 DIAGNOSIS — E612 Magnesium deficiency: Principal | ICD-10-CM

## 2023-02-08 DIAGNOSIS — Z5181 Encounter for therapeutic drug level monitoring: Principal | ICD-10-CM

## 2023-02-08 DIAGNOSIS — Z944 Liver transplant status: Principal | ICD-10-CM

## 2023-02-08 DIAGNOSIS — E612 Magnesium deficiency: Principal | ICD-10-CM

## 2023-02-15 DIAGNOSIS — Z944 Liver transplant status: Principal | ICD-10-CM

## 2023-02-15 DIAGNOSIS — Z5181 Encounter for therapeutic drug level monitoring: Principal | ICD-10-CM

## 2023-02-15 DIAGNOSIS — E612 Magnesium deficiency: Principal | ICD-10-CM

## 2023-03-02 ENCOUNTER — Other Ambulatory Visit: Payer: Self-pay | Admitting: Internal Medicine

## 2023-03-02 ENCOUNTER — Other Ambulatory Visit: Payer: Self-pay | Admitting: Family Medicine

## 2023-03-02 MED ORDER — METOPROLOL TARTRATE 25 MG TABLET
ORAL_TABLET | Freq: Two times a day (BID) | ORAL | 1 refills | 90.00 days
Start: 2023-03-02 — End: ?

## 2023-03-02 MED ORDER — LEVOTHYROXINE 175 MCG TABLET
ORAL_TABLET | Freq: Every day | ORAL | 2 refills | 90.00 days
Start: 2023-03-02 — End: ?

## 2023-03-03 ENCOUNTER — Encounter: Admit: 2023-03-03 | Discharge: 2023-03-04 | Payer: MEDICARE | Attending: Ambulatory Care | Primary: Ambulatory Care

## 2023-03-03 NOTE — Unmapped (Signed)
[facility] at [facility]  [address]  [place], [place] [address]    Clinical Pharmacist Visit Summary    The patient reports they are physically located in [place] and is currently: at home. I conducted a audio/video visit. I spent 0s on the video call with the patient. I spent an additional 30 minutes on pre- and post-visit activities on the date of service .     Assessment and Plan:   1. Diabetes, post-transplant: fair control.  Last A1c = 7.4% on 09/15/2022 with goal <7% without hypoglycemia.   Freestyle Libre 3 CGM data indicates improved glycemic control: ABG: 126 mg/dL, GMI 6.3 %, TIR 60%, high/very high 6%, low/very low 1%. Denies symptoms of hypoglycemia but did have 1 hypoglycemic event overnight where he was awaken by [facility] alarm after he had accidentally taken double of his AM doses (Jardiance 25 mg, Tradjenta 5 mg)  Takes Jardiance 25 mg daily, Tradjenta 5 mg, and pioglitazone 15 mg daily with good adherence and without affordability concerns (gets Jardiance, Tradjenta via [employer] MAP, pt approved for 2025).   Discussed with patient excellent glycemic control and recommended beginning to titrate back pioglitazone and ultimately discontinue and maintain patient on Jardiance and Tradjenta given his cardiac history. With shared decision making, patient prefers to adjust regimen at upcoming appointment with Dr. Dell Ponto on 03/29/23 when his A1c is repeated.  Of note, no UACR in chart and last lipid panel in 2021 (LDL 52, at goal). Recommend obtaining both at next in-person clinic visit to remain up-to-date on health maintenance for PTDM.   Continue pioglitazone to 15 mg PO daily (take half of 30 mg tablet).  Continue Jardiance 25 mg PO daily.  Continue Tradjenta 5 mg PO once daily.  Follow-up on [employer] MAP application - Post visit, confirmed with [employer] that patient is approved through 02/23/24 for Tradjenta 5 mg and Jardiance 25 mg #90 tablets for 90 day supply and is opted into automatic refills.  Reviewed signs/symptoms/treatment of hypoglycemia. Continue Freestyle Libre 3 CGM. Encouraged patient to check SMBG if experiencing any symptoms of hypoglycemia  Follow up with clinical pharmacist as needed  Follow up with Dr. Beryle Flock MD on 03/29/2023 and repeat lipid panel and A1c and obtain baseline UACR.    I spent a total of 30 minutes on the phone with the patient delivering clinical care and providing education/counseling.     Fawn Kirk, PharmD  PGY2 Pharmacy Resident - Ambulatory Care    I have reviewed the resident's note and agree with the assessment and plan.     Despina Pole, PharmD, CPP  Ambulatory Care Generalist    Subjective:   Reason for visit: Follow up with Clinical Pharmacist Practitioner for blood glucose review and adjustment of diabetes regimen as needed.    Anthony Alvarado is a 63 y.o. year old male with a history of diabetes post-transplant who presents today for a diabetes-related visit. PMH includes liver transplant (2022), hepatic steatosis, obesity, hypothyroidism, HTN, and CAD s/p CABG (2023)    Diabetes Provider and Last Visit Date: Saddie Benders?? PharmD, CPP on 01/06/23    At Last Visit:   Continue Jardiance 25 mg PO daily.  DECREASE pioglitazone to 15 mg PO daily.  RESTART Tradjenta 5 mg PO daily  Continue OFF insulin (previously on while completing steroid taper)    Interval History of Present Illness:   Patient presents virtually for diabetes follow up. Pt reports he is doing well  and confirms adherence to and tolerability of current regimen of Jardiance 25 mg once daily, Actos 15 mg once daily, and Tradjenta 5 mg once daily. Denies any GI side effects with recent start of Tradjenta (previously intolerant to Ozempic). He continues OFF insulin (since stopping prednisone). Continues to use Freestyle Libre 3 at home without issue. He reports he missed his morning doses yesterday. Additionally, he reports on Sunday 02/21/23, he accidentally took 2 morning doses of Jardiance 25 mg and Tradjenta 5 mg (he had taken his meds, then refilled his pill box and accidentally took another Sunday AM dose as he had forgotten he had already taken his medications that day). That evening overnight, he was awoken by [facility] low alarm (per libreview BG 53, 53, 68, 69 between 12am-3am), but he felt no symptoms of hypoglycemia. He treated with glucose tablets and BG increased >70 mg/dL.     Denies affordability concerns. He confirms he completed the [employer] MAP application in December and reports it has been approved. He notes the RN updated the application with Jardiance and Tradjenta. Pt reports he has had a foot exam (checked by PCP) and eye exam in the last year. Diet and exercise as documented below.  Discussed the importance of medication adherence, diet, and exercise to improve glycemic control. He had no additional questions or concerns.    Current Medications: Reports adherence to the following diabetes medications:   Jardiance 25 mg daily - taking ([employer])  Pioglitazone 15 mg daily - taking   Tradjenta 5 mg daily - taking ([employer])    Previous Medications:   Ozempic 0.25 mg once weekly - stopped on 08/01/2022 (GI intolerance)  Metformin XR 1000 mg BID  - stopped on 09/02/2022 (GI intolerance)    Diet (Typical):  Breakfast (8AM): Cereal, eggs and sausage and bacon  Lunch (11-12) : Fast food (Hamburgers/fries), grilled cheese sandwich   Dinner (4-6 PM): Vegetables (lima/pinto beans/corn/ turnip greens), spaghetti, brown rice, tacos, hotdogs  Snacks (usually at night): Pretzels, chips, chocolate chip cookies. Cheeze-its. Ice cream  Beverages: Water, diet Pepsi, sugar free liquid IV   03/02/23: Not assessed at today's visit     Exercise: Denies regular physical activity, pt has been sick all throughout the summer and has SOB d/t PNA currently. Has incorporated walking, 15-20 minutes a ~once weekly   03/02/23: Not assessed at today's visit     Social History:   Tobacco use: no  Illicit drug use use: no  Alcohol use: no  Occupation: Unemployed; disability   Household: Lives alone    POC glucose level today of N/A mg/dL - virtual visit.     Glucose Monitoring: Freestyle Libre CGM; Estimated GMI 6.3%, Average BG 126 mg/dL.TIR 93%, High/Very High 6%, Low 1%, Very Low 0%.               Hypoglycemia:    Symptoms of hypoglycemia since last visit: no; but did have 1 incidence of waking up from low alarm overnight. Of note, had accidentally taken x2 AM doses of Jardiance 25 mg and Tradjenta 5 mg. Did not check BG with glucometer at this time.  If no recent hypoglycemia, prior recognition of hypoglycemia symptoms and knowledge of treatment: n/a   Treats with:  glucose tablets and hard candy    CARDIOVASCULAR RISK REDUCTION  History of clinical ASCVD? yes, CAD s/p CABG 2023, aortic stenosis (s/p aortic valve replacement)  History of heart failure? no  History of hyperlipidemia? yes  Taking statin? yes,  pravastatin 40 mg daily  Taking aspirin? yes  Taking SGLT-2i and/or GLP- 1 RA? yes, Jardiance 25 mg daily, unable to tolerate Ozempic with GI issues (severe diarrhea, history of colitis, and weight loss)    BLOOD PRESSURE CONTROL  History of hypertension?: yes  Taking ACEi/ARB? no    KIDNEY CARE  History of Chronic Kidney Disease? no  History of albuminuria? no  Taking SGLT-2i and/or GLP- 1 RA? As above  Taking ACEi/ARB? As above      Current Outpatient Medications:     amoxicillin-clavulanate (AUGMENTIN) 875-125 mg per tablet, Take 1 mg by mouth Two (2) times a day (at 8am and 12:00)., Disp: , Rfl:     aspirin 325 MG tablet, Take 1 tablet (325 mg total) by mouth daily., Disp: , Rfl:     azelastine (ASTELIN) 137 mcg (0.1 %) nasal spray, 1 spray into each nostril daily as needed., Disp: , Rfl:     blood sugar diagnostic (ONETOUCH ULTRA TEST) Strp, USE TO CHECK SUGAR DAILY, Disp: , Rfl:     blood-glucose sensor (FREESTYLE LIBRE 3 SENSOR) Devi, For use as directed. change every 14 days., Disp: 3 each, Rfl: 11 calcium carbonate (TUMS) 200 mg calcium (500 mg) chewable tablet, Chew 1 tablet (500 mg total) daily. Pt reports taking every other day., Disp: , Rfl:     doxycycline (VIBRA-TABS) 100 MG tablet, Take 1 tablet (100 mg total) by mouth., Disp: , Rfl:     empagliflozin (JARDIANCE) 25 mg tablet, Take 1 tablet (25 mg total) by mouth daily., Disp: , Rfl:     fluticasone propionate (FLONASE) 50 mcg/actuation nasal spray, PLACE ONE OR TWO SPRAYS INTO BOTH NOSTRILS DAILY AS NEEDED., Disp: , Rfl:     glucose 4 GM chewable tablet, Chew 4 tablets (16 g total) every ten (10) minutes as needed for low blood sugar ((For Blood Glucose LESS than 70 mg/dL and GREATER than or EQUAL to 54 mg/dL and able to take by mouth.))., Disp: 50 tablet, Rfl: 12    glycerin/witch hazel leaf (HEMORRHOID TOP), Apply topically., Disp: , Rfl:     hydrocortisone 1 % crpe, APPLY TO AFFECTED AREA TWICE A DAY, Disp: , Rfl:     levothyroxine (SYNTHROID) 175 MCG tablet, Take 1 tablet (175 mcg total) by mouth daily before breakfast., Disp: 90 tablet, Rfl: 2    linagliptin (TRADJENTA) 5 mg Tab, Take 1 tablet (5 mg total) by mouth daily., Disp: , Rfl:     metoPROLOL tartrate (LOPRESSOR) 25 MG tablet, Take 1 tablet (25 mg total) by mouth two (2) times a day., Disp: 180 tablet, Rfl: 1    pen needle, diabetic 32 gauge x 5/32 (4 mm) Ndle, Use with insulin up to 4 times/day as needed., Disp: 100 each, Rfl: 1    pioglitazone (ACTOS) 30 MG tablet, Take 1 tablet (30 mg total) by mouth daily., Disp: 90 tablet, Rfl: 3    pravastatin (PRAVACHOL) 40 MG tablet, Take 1 tablet (40 mg total) by mouth every evening., Disp: 30 tablet, Rfl: 2    sertraline (ZOLOFT) 50 MG tablet, Take 1 tablet (50 mg total) by mouth daily., Disp: 90 tablet, Rfl: 3    tacrolimus (PROGRAF) 1 MG capsule, Take 3 capsules (3 mg total) by mouth two (2) times a day., Disp: 180 capsule, Rfl: 11  No current facility-administered medications for this visit.    Facility-Administered Medications Ordered in Other Visits:     diazePAM (VALIUM) 5 mg/mL injection, , , ,  Objective:   Vitals:    There were no vitals filed for this visit.      Past Medical History:    Active Ambulatory Problems     Diagnosis Date Noted    Diabetes mellitus without complication (CMS-HCC) 05/09/2017    Hypothyroidism 08/09/2006    Cirrhosis of liver without ascites (CMS-HCC) 01/10/2018    OSA (obstructive sleep apnea) 09/14/2011    Essential hypertension 08/09/2006    Encounter for pre-transplant evaluation for chronic liver disease 03/10/2018    Pleural effusion 09/09/2018    Pneumothorax after biopsy 09/11/2018    Liver transplant recipient (CMS-HCC) 09/16/2018    Allergic rhinitis 08/09/2006    Arthralgia 10/25/2017    Asthma 09/14/2011    Bicuspid aortic valve 02/25/2011    Flying phobia 09/18/2013    Mucosal abnormality of stomach 09/27/2018    Nephrolithiasis 07/28/2018    Pure hypercholesterolemia 02/14/2007    Umbilical hernia 08/14/2012    Unspecified hearing loss 08/11/2006    COVID-19 03/16/2019    Colitis 09/27/2022    Hemorrhoids, external 10/14/2022    Steatosis of liver 10/19/2022    Steroid-induced hyperglycemia 10/31/2022    Leukocytosis 10/31/2022    Hyponatremia with increased serum osmolality 10/31/2022     Resolved Ambulatory Problems     Diagnosis Date Noted    No Resolved Ambulatory Problems     Past Medical History:   Diagnosis Date    Aortic valve stenosis     Autoimmune cholangitis     Carrier of hemochromatosis HFE gene mutation     Cholestatic cirrhosis (CMS-HCC)     Hepatic encephalopathy (CMS-HCC)     Hypertension     Other osteoporosis without current pathological fracture     Pneumonia 10/2022    Sleep apnea     Type 2 diabetes mellitus (CMS-HCC)        Wt Readings from Last 3 Encounters:   12/14/22 83.6 kg (184 lb 6.4 oz)   11/20/22 80.7 kg (178 lb)   11/09/22 78.9 kg (174 lb)       Lab Results   Component Value Date    A1C 7.4 (H) 09/15/2022    A1C 7.4 (H) 04/06/2022    A1C 6.6 (H) 09/18/2019    A1C 6.0 (H) 04/17/2019    A1C 5.9 (H) 12/19/2018    A1C 4.7 (L) 10/24/2018    A1C 4.4 (L) 09/15/2018    A1C 5.1 01/04/2018       Lab Results   Component Value Date    NA 139 01/01/2023    K 4.4 01/01/2023    CL 104 01/01/2023    CO2 21 01/01/2023    BUN 21 01/01/2023    CREATININE 1.09 01/01/2023    GLU 121 11/20/2022    CALCIUM 9.3 01/01/2023    ALBUMIN 3.7 11/20/2022    PHOS 3.7 11/20/2022       Lab Results   Component Value Date    ALKPHOS 109 01/01/2023    BILITOT 0.2 01/01/2023    BILIDIR 0.10 01/01/2023    PROT 7.3 01/01/2023    ALBUMIN 3.7 11/20/2022    ALT 16 01/01/2023    AST 19 01/01/2023       No results found for: [facility]    Lab Results   Component Value Date    CHOL 122 04/17/2019    CHOL 114 12/19/2018     Lab Results   Component Value Date    HDL 33 (L) 04/17/2019  HDL 33 (L) 12/19/2018     Lab Results   Component Value Date    LDL 52 (L) 04/17/2019    LDL 32 (L) 12/19/2018    LDL 79.6 03/21/2018     Lab Results   Component Value Date    VLDL 37 04/17/2019    VLDL 98.1 (H) 12/19/2018     Lab Results   Component Value Date    CHOLHDLRATIO 3.7 04/17/2019    CHOLHDLRATIO 3.5 12/19/2018     Lab Results   Component Value Date    TRIG 185 (H) 04/17/2019    TRIG 243 (H) 12/19/2018       The 10-year ASCVD risk score (Arnett DK, et al., 2019) is: 19.8%    Values used to calculate the score:      Age: 63 years      Sex: Male      Is Non-Hispanic African American: No      Diabetic: Yes      Tobacco smoker: No      Systolic Blood Pressure: 142 mmHg      Is BP treated: Yes      HDL Cholesterol: 28 mg/dL      Total Cholesterol: 113 mg/dL    Note: For patients with SBP <90 or >200, Total Cholesterol <130 or >320, HDL <20 or >100 which are outside of the allowable range, the calculator will use these upper or lower values to calculate the patient???s risk score.

## 2023-03-05 NOTE — Unmapped (Signed)
I was immediately available via phone/pager or present on site.  I reviewed and discussed the case with the fellow, but did not see the patient.  I agree with the assessment and plan as documented in the fellow's note. Trystian Crisanto Jin Avish Torry, MD

## 2023-03-06 DIAGNOSIS — Z944 Liver transplant status: Principal | ICD-10-CM

## 2023-03-06 DIAGNOSIS — E612 Magnesium deficiency: Principal | ICD-10-CM

## 2023-03-06 DIAGNOSIS — C220 Liver cell carcinoma: Principal | ICD-10-CM

## 2023-03-06 DIAGNOSIS — Z5181 Encounter for therapeutic drug level monitoring: Principal | ICD-10-CM

## 2023-03-08 ENCOUNTER — Telehealth: Payer: Self-pay | Admitting: Internal Medicine

## 2023-03-08 MED FILL — TACROLIMUS 1 MG CAPSULE, IMMEDIATE-RELEASE: ORAL | 30 days supply | Qty: 180 | Fill #4

## 2023-03-08 MED FILL — METOPROLOL TARTRATE 25 MG TABLET: ORAL | 90 days supply | Qty: 180 | Fill #0

## 2023-03-08 MED FILL — FREESTYLE LIBRE 3 PLUS SENSOR DEVICE: 28 days supply | Qty: 2 | Fill #3

## 2023-03-08 MED FILL — PRAVASTATIN 40 MG TABLET: ORAL | 30 days supply | Qty: 30 | Fill #1

## 2023-03-08 NOTE — Unmapped (Signed)
[facility] Refill Coordination Note    Anthony Alvarado, [place]: 01-24-1961  Phone: [phone] (home)       All above HIPAA information was verified with patient.         03/04/2023     1:24 PM   Specialty Rx Medication Refill Questionnaire   Which Medications would you like refilled and shipped? Tacrolimus 1mg  about a week left. Metoprolol Tartate 25 mg about a week left. Levothyroxine 175 mcg  about a week left. Pravastatine 40 mg , out. Freestyle Libre 3 plus sensor devi, out .   Please list all current allergies: watermellon   Have you missed any doses in the last 30 days? Yes   If Yes, how many doses have you missed ? 0-2   Have you had any changes to your medication(s) since your last refill? No   How many days remaining of each medication do you have at home? out of some and about a week on others   Have you experienced any side effects in the last 30 days? No   Please enter the full address (street address, city, state, zip code) where you would like your medication(s) to be delivered to. [address], [place], [place] [address]   Please specify on which day you would like your medication(s) to arrive. Note: if you need your medication(s) within 3 days, please call the pharmacy to schedule your order at [phone]  03/06/2023   Has your insurance changed since your last refill? No   Would you like a pharmacist to call you to discuss your medication(s)? No   Do you require a signature for your package? (Note: if we are billing Medicare Part B or your order contains a controlled substance, we will require a signature) No         Completed refill call assessment today to schedule patient's medication shipment from the [facility] [phone]).  All relevant notes have been reviewed.       Confirmed patient received a Conservation officer, historic buildings and a Surveyor, mining with first shipment. The patient will receive a drug information handout for each medication shipped and additional FDA Medication Guides as required.         REFERRAL TO PHARMACIST     Referral to the pharmacist: Not needed      [facility]     Shipping address confirmed in Epic.     Delivery Scheduled: Yes, Expected medication delivery date: 03/09/2023.     Medication will be delivered via UPS to the prescription address in Epic WAM.    [name]   [facility] Delivery Pharm[facility] Specialty Technician

## 2023-03-08 NOTE — Telephone Encounter (Signed)
 Pt c/o medication issue:  1. Name of Medication:   levothyroxine  (SYNTHROID ) 175 MCG tablet   2. How are you currently taking this medication (dosage and times per day)?   3. Are you having a reaction (difficulty breathing--STAT)?   4. What is your medication issue?   Caller [facility]) stated they need to report the manufacturer for this medication is changing from [employer] to [employer].

## 2023-03-10 MED FILL — LEVOTHYROXINE 175 MCG TABLET: ORAL | 40 days supply | Qty: 40 | Fill #0

## 2023-03-25 DIAGNOSIS — E119 Type 2 diabetes mellitus without complications: Secondary | ICD-10-CM | POA: Diagnosis not present

## 2023-03-28 NOTE — Progress Notes (Signed)
 Cardiology Office Note   Date:  03/31/2023   ID:  [name], [name] September 19, 1960, MRN [id number]  PCP:  Cleatus Arlyss RAMAN, MD  Cardiologist:   Vina Gull, MD   F/U of AV dz and CAD     History of Present Illness: [name] is a 63 y.o. male with a history of aortic stenosis, cirrhosis, HTN. OSA (On CPAP).    2019  As pretransplant evaluation LHC done   This showed 40% D1, 30 to 40% D2; 40 to 50% OM2   LVEF normal   Mild AS    October 2022  Echo showed LVEF normal   Mod LVH   Mean grad 31 mm I  DVI 0.22.  Felt severe AS   LHC showed severe LAD stenosis   CCTA showed multiple multilple nonobstructie PE   Started on Eliquis  L Feb 2023 PT  underwent CABG x 1(LIMA to LAD) and AVR with Inspiriris Resilia pericardial valve   June 2024  Pt  admitted for colitis Initially at [facility]  Then at [facility] .  Hgb at one point 6.6.   Rx Abx    Pt had syncopal spell   BP 70s/   Responded to fluids   Felt vagal, occurred on commode      I saw the pt in Sept 2024  Since seen he denies CP   Breathing is stable   He still gets tired with activity   Never felt like he got a great response from valve replacement  He notes occasional heart fluttering, often when not doing anyting    Deneis dizziness  Episodes last about 20 t o30 min    Notes some achiness      Admits to eating poorly    NOt may veggies    Current Meds  Medication Sig   acetaminophen  (TYLENOL ) 500 MG tablet Take 500 mg by mouth every 6 (six) hours as needed for moderate pain or headache.   albuterol  (VENTOLIN  HFA) 108 (90 Base) MCG/ACT inhaler Inhale 2 puffs into the lungs every 6 (six) hours as needed for wheezing or shortness of breath.   aspirin  EC 325 MG EC tablet Take 1 tablet (325 mg total) by mouth daily.   azelastine  (ASTELIN ) 0.1 % nasal spray PLACE 2 SPRAYS INTO BOTH NOSTRILS TWICE DAILY AS NEEDED   calcium carbonate (TUMS - DOSED IN MG ELEMENTAL CALCIUM) 500 MG chewable tablet Chew 1 tablet by mouth daily.    Cholecalciferol  (VITAMIN D ) 50 MCG (2000 UT) tablet Take 2,000 Units by mouth daily.   empagliflozin  (JARDIANCE ) 25 MG TABS tablet Take 1 tablet (25 mg total) by mouth daily before breakfast.   fluticasone  (FLONASE ) 50 MCG/ACT nasal spray PLACE ONE OR TWO SPRAYS INTO BOTH NOSTRILS DAILY AS NEEDED.   levothyroxine  (SYNTHROID ) 175 MCG tablet Take 1 tablet (175 mcg total) by mouth daily before breakfast.   linagliptin  (TRADJENTA ) 5 MG TABS tablet Take 1 tablet by mouth daily.   metoprolol  tartrate (LOPRESSOR ) 25 MG tablet Take 1 tablet (25 mg total) by mouth two (2) times a day.   NON FORMULARY Pt uses a cpap nightly   ondansetron  (ZOFRAN ) 4 MG tablet Take 1 tablet (4 mg total) by mouth every 6 (six) hours.   OneTouch Delica Lancets 33G MISC USE TO CHECK SUGAR DAILY   ONETOUCH ULTRA test strip USE TO CHECK SUGAR DAILY   pioglitazone  (ACTOS ) 30 MG tablet Take 1 tablet (30 mg total) by mouth daily.  pravastatin  (PRAVACHOL ) 40 MG tablet Take 1 tablet (40 mg total) by mouth every evening.   sertraline  (ZOLOFT ) 50 MG tablet Take 1 tablet (50 mg total) by mouth daily.   tacrolimus  (PROGRAF ) 1 MG capsule Take 3 capsules (3 mg total) by mouth 2 (two) times daily.     Allergies:   Mycophenolate  mofetil and Watermelon flavoring agent (non-screening)   Past Medical History:  Diagnosis Date   Allergy    Asthma    Bicuspid aortic valve    sees dr okey   Cirrhosis [facility])    Coronary artery disease    DM2 (diabetes mellitus, type 2) (HCC)    Fatty liver    with h/o elevated LFT's   GERD (gastroesophageal reflux disease)    Heart murmur    Hypertension    Itching    all over last few months   Jaundice    Liver transplant recipient (HCC)    09/16/2018 at [facility]   Migraine with aura    OSA (obstructive sleep apnea) 09/14/2011   PSG 11/08/11>>AHI 31.6, SpO2 low 85%. wears CPAP, pt does not know settings   Pulmonary embolism (HCC)    2022   Sleep apnea    Thyroid  disease     Past Surgical History:   Procedure Laterality Date   AORTIC VALVE REPLACEMENT N/A 04/03/2021   Procedure: AORTIC VALVE REPLACEMENT (AVR) USING 27 MM INSPIRIS RESILIA  AORTIC VALVE;  Surgeon: Lucas Dorise POUR, MD;  Location: [facility] OR;  Service: Open Heart Surgery;  Laterality: N/A;   APPENDECTOMY  11/2007   Emergency   BIOPSY  09/22/2022   Procedure: BIOPSY;  Surgeon: Federico Rosario BROCKS, MD;  Location: [facility] ENDOSCOPY;  Service: Gastroenterology;;   BIOPSY THYROID   08/19/2007   Attempted, no tissue obtained   CARDIAC CATHETERIZATION  03/21/2018   CARDIAC VALVE REPLACEMENT     Feb 2023   CARDIOVASCULAR STRESS TEST  04/2005   Negative 06/05   CHOLECYSTECTOMY     CORONARY ARTERY BYPASS GRAFT N/A 04/03/2021   Procedure: CORONARY ARTERY BYPASS GRAFTING (CABG) x ONE ON CARDIOPULMONARY BYPASS. LIMA TO LAD;  Surgeon: Lucas Dorise POUR, MD;  Location: [facility] OR;  Service: Open Heart Surgery;  Laterality: N/A;   DOPPLER ECHOCARDIOGRAPHY  07/2002   ESOPHAGEAL BANDING N/A 05/04/2016   Procedure: ESOPHAGEAL BANDING;  Surgeon: Lupita FORBES Commander, MD;  Location: [facility] ENDOSCOPY;  Service: Endoscopy;  Laterality: N/A;   ESOPHAGOGASTRODUODENOSCOPY (EGD) WITH PROPOFOL  N/A 05/04/2016   Procedure: ESOPHAGOGASTRODUODENOSCOPY (EGD) WITH PROPOFOL ;  Surgeon: Lupita FORBES Commander, MD;  Location: [facility] ENDOSCOPY;  Service: Endoscopy;  Laterality: N/A;   ESOPHAGOGASTRODUODENOSCOPY (EGD) WITH PROPOFOL  N/A 09/22/2022   Procedure: ESOPHAGOGASTRODUODENOSCOPY (EGD) WITH PROPOFOL ;  Surgeon: Federico Rosario BROCKS, MD;  Location: [facility] ENDOSCOPY;  Service: Gastroenterology;  Laterality: N/A;   FLEXIBLE SIGMOIDOSCOPY N/A 09/22/2022   Procedure: FLEXIBLE SIGMOIDOSCOPY;  Surgeon: Federico Rosario BROCKS, MD;  Location: [facility] ENDOSCOPY;  Service: Gastroenterology;  Laterality: N/A;   LIVER TRANSPLANT     09/16/2018 at [facility]   RIGHT/LEFT HEART CATH AND CORONARY ANGIOGRAPHY N/A 12/20/2020   Procedure: RIGHT/LEFT HEART CATH AND CORONARY ANGIOGRAPHY;  Surgeon: Verlin Lonni BIRCH, MD;  Location: [facility]  INVASIVE CV LAB;  Service: Cardiovascular;  Laterality: N/A;   TEE WITHOUT CARDIOVERSION N/A 04/03/2021   Procedure: TRANSESOPHAGEAL ECHOCARDIOGRAM (TEE);  Surgeon: Lucas Dorise POUR, MD;  [facility] OR;  Service: Open Heart Surgery;  Laterality: N/A;     Social History:  The patient  reports that he has never smoked. He has  been exposed to tobacco smoke. He has never used smokeless tobacco. He reports that he does not drink alcohol and does not use drugs.   Family History:  The patient's family history includes Asthma in his mother; COPD in his father; Cancer in his father; Colon cancer in his maternal grandmother; Heart disease in his maternal grandfather and sister; Liver disease in his sister; Prostate cancer in his maternal uncle.    ROS:  Please see the history of present illness. All other systems are reviewed and  Negative to the above problem except as noted.    PHYSICAL EXAM: VS:  BP 126/88   Pulse 66   Ht 5' 8 (1.727 m)   Wt 195 lb 12.8 oz (88.8 kg)   SpO2 99%   BMI 29.77 kg/m   GEN: Pt is a 62 yo in no acute distress  Neck: JVP normal    No carotid bruits Cardiac: RRR  no murmur  No LE  edema  Respiratory:  clear to auscultation   GI: soft, nontender,  No hepatomegaly    EKG:  EKG is not done today   Echo   05/15/21 Left ventricular ejection fraction, by estimation, is 55 to 60%. The left ventricle has normal function. The left ventricle has no regional wall motion abnormalities. There is moderate concentric left ventricular hypertrophy. Left ventricular diastolic parameters are consistent with Grade II diastolic dysfunction (pseudonormalization). Elevated left atrial pressure. The average left ventricular global longitudinal strain is -16.3 %. The global longitudinal strain is abnormal. 1. Right ventricular systolic function is normal. The right ventricular size is normal. Tricuspid regurgitation signal is inadequate for assessing PA pressure. 2. 3. Left atrial  size was mildly dilated. 4. The mitral valve is normal in structure. No evidence of mitral valve regurgitation. The aortic valve has been repaired/replaced. Aortic valve regurgitation is not visualized. There is a 27 mm Inspiris Resilia bioprosthetic valve present in the aortic position. Procedure Date: 04/03/21. Echo findings are consistent with normal structure and function of the aortic valve prosthesis. Aortic valve mean gradient measures 6.0 mmHg. Aortic valve Vmax measures 1.66 m/s. 5. Aortic dilatation noted. There is borderline dilatation of the aortic root, measuring 38 mm. There is mild dilatation of the ascending aorta, measuring 41 mm.  LHeart cath   12/20/20   Prox RCA lesion is 20% stenosed.   Mid RCA to Dist RCA lesion is 20% stenosed.   RPDA lesion is 20% stenosed.   Mid LAD lesion is 80% stenosed.   Severe mid LAD stenosis Mild non-obstructive disease in the RCA and Circumflex Severe aortic stenosis (mean gradient 50.6 mmHg, peak to peak gradient 66 mmHg, AVA 0.78 cm2).    Recommendations: He has a severe mid LAD stenosis (not critical) and severe AS with bicuspid aortic valve. Ideally he would be treated with single vessel CABG (LIMA to LAD) and surgical AVR given his young age and given the pathology associated with bicuspid aortic valves. Given his prior liver transplantation, he will be higher risk for CABG/AVR. If he is not felt to be a good candidate for surgery, will plan PCI of the LAD followed by TAVR. Will review with the structural heart team and plan for him to see Dr. Lucas in the CT surgery office following his CT scans.   Lipid Panel    Component Value Date/Time   CHOL 113 01/01/2022 1028   TRIG 465 (H) 01/01/2022 1028   HDL 28 (L) 01/01/2022 1028   CHOLHDL 4.0 01/01/2022 1028  CHOLHDL 3 11/18/2020 0958   VLDL 79.4 (H) 11/18/2020 0958   LDLCALC 22 01/01/2022 1028   LDLDIRECT 35.0 11/18/2020 0958      Wt Readings from Last 3 Encounters:  03/31/23  195 lb 12.8 oz (88.8 kg)  01/05/23 191 lb 6.4 oz (86.8 kg)  01/04/23 189 lb (85.7 kg)      ASSESSMENT AND PLAN:  1  CAD  Pt is s/p CABG x 1 with LIMA to LAD    No symptoms of angina  Follow   2  Aortic stenosis   Pt is s/p AVR in early 2023   Echo in March 2023 showed prosthesis functioning well    3  HTN  BP is OK  Follow   4  Tachycardia   Will set up for Zio patch to document rhythm   5  DM  A1 C 7.6  Reviewed diet extensively  Miinimize processed foods, sugars  Lots of veggies.  Esp with hx of colitis   6  HL  LDL  22    Would recomm holding pravastatin  Follow repsonse for achiness   7  GI   Follows at [facility] with GI for colitis and liver transplant service          Current medicines are reviewed at length with the patient today.  The patient does not have concerns regarding medicines.  Signed, Vina Gull, MD  03/31/2023 9:30 AM    [facility] [address] [place], [place], [place]  [address] Phone: [phone]; Fax: [phone]

## 2023-03-29 ENCOUNTER — Ambulatory Visit: Admit: 2023-03-29 | Discharge: 2023-03-30 | Payer: MEDICARE

## 2023-03-29 DIAGNOSIS — E119 Type 2 diabetes mellitus without complications: Principal | ICD-10-CM

## 2023-03-29 DIAGNOSIS — Z944 Liver transplant status: Principal | ICD-10-CM

## 2023-03-29 DIAGNOSIS — C220 Liver cell carcinoma: Principal | ICD-10-CM

## 2023-03-29 DIAGNOSIS — E039 Hypothyroidism, unspecified: Principal | ICD-10-CM

## 2023-03-29 DIAGNOSIS — Z5181 Encounter for therapeutic drug level monitoring: Principal | ICD-10-CM

## 2023-03-29 DIAGNOSIS — E612 Magnesium deficiency: Principal | ICD-10-CM

## 2023-03-29 DIAGNOSIS — Z7982 Long term (current) use of aspirin: Secondary | ICD-10-CM | POA: Diagnosis not present

## 2023-03-29 DIAGNOSIS — Z888 Allergy status to other drugs, medicaments and biological substances status: Secondary | ICD-10-CM | POA: Diagnosis not present

## 2023-03-29 DIAGNOSIS — Z7984 Long term (current) use of oral hypoglycemic drugs: Secondary | ICD-10-CM | POA: Diagnosis not present

## 2023-03-29 DIAGNOSIS — Z79890 Hormone replacement therapy (postmenopausal): Secondary | ICD-10-CM | POA: Diagnosis not present

## 2023-03-29 DIAGNOSIS — Z91018 Allergy to other foods: Secondary | ICD-10-CM | POA: Diagnosis not present

## 2023-03-29 DIAGNOSIS — E669 Obesity, unspecified: Secondary | ICD-10-CM | POA: Diagnosis not present

## 2023-03-29 DIAGNOSIS — I10 Essential (primary) hypertension: Secondary | ICD-10-CM | POA: Diagnosis not present

## 2023-03-29 DIAGNOSIS — Z79899 Other long term (current) drug therapy: Secondary | ICD-10-CM | POA: Diagnosis not present

## 2023-03-29 LAB — ALBUMIN / CREATININE URINE RATIO
ALBUMIN QUANT URINE: 1.2 mg/dL
ALBUMIN/CREATININE RATIO: 16.3 ug/mg (ref 0.0–30.0)
CREATININE, URINE: 73.6 mg/dL

## 2023-03-29 LAB — CBC W/ AUTO DIFF
BASOPHILS ABSOLUTE COUNT: 0.1 10*9/L (ref 0.0–0.1)
BASOPHILS RELATIVE PERCENT: 0.7 %
EOSINOPHILS ABSOLUTE COUNT: 0.7 10*9/L — ABNORMAL HIGH (ref 0.0–0.5)
EOSINOPHILS RELATIVE PERCENT: 5.1 %
HEMATOCRIT: 37.4 % — ABNORMAL LOW (ref 39.0–48.0)
HEMOGLOBIN: 11.9 g/dL — ABNORMAL LOW (ref 12.9–16.5)
LYMPHOCYTES ABSOLUTE COUNT: 1.7 10*9/L (ref 1.1–3.6)
LYMPHOCYTES RELATIVE PERCENT: 12.5 %
MEAN CORPUSCULAR HEMOGLOBIN CONC: 31.9 g/dL — ABNORMAL LOW (ref 32.0–36.0)
MEAN CORPUSCULAR HEMOGLOBIN: 26 pg (ref 25.9–32.4)
MEAN CORPUSCULAR VOLUME: 81.3 fL (ref 77.6–95.7)
MEAN PLATELET VOLUME: 7.5 fL (ref 6.8–10.7)
MONOCYTES ABSOLUTE COUNT: 1.5 10*9/L — ABNORMAL HIGH (ref 0.3–0.8)
MONOCYTES RELATIVE PERCENT: 10.6 %
NEUTROPHILS ABSOLUTE COUNT: 9.7 10*9/L — ABNORMAL HIGH (ref 1.8–7.8)
NEUTROPHILS RELATIVE PERCENT: 71.1 %
PLATELET COUNT: 328 10*9/L (ref 150–450)
RED BLOOD CELL COUNT: 4.6 10*12/L (ref 4.26–5.60)
RED CELL DISTRIBUTION WIDTH: 14.1 % (ref 12.2–15.2)
WBC ADJUSTED: 13.7 10*9/L — ABNORMAL HIGH (ref 3.6–11.2)

## 2023-03-29 LAB — COMPREHENSIVE METABOLIC PANEL
ALBUMIN: 3.6 g/dL (ref 3.4–5.0)
ALKALINE PHOSPHATASE: 101 U/L (ref 46–116)
ALT (SGPT): 12 U/L (ref 10–49)
ANION GAP: 11 mmol/L (ref 5–14)
AST (SGOT): 12 U/L (ref <=34)
BILIRUBIN TOTAL: 0.3 mg/dL (ref 0.3–1.2)
BLOOD UREA NITROGEN: 24 mg/dL — ABNORMAL HIGH (ref 9–23)
BUN / CREAT RATIO: 20
CALCIUM: 9.3 mg/dL (ref 8.7–10.4)
CHLORIDE: 104 mmol/L (ref 98–107)
CO2: 23.8 mmol/L (ref 20.0–31.0)
CREATININE: 1.21 mg/dL — ABNORMAL HIGH (ref 0.73–1.18)
EGFR CKD-EPI (2021) MALE: 69 mL/min/{1.73_m2} (ref >=60)
GLUCOSE RANDOM: 182 mg/dL — ABNORMAL HIGH (ref 70–179)
POTASSIUM: 5 mmol/L — ABNORMAL HIGH (ref 3.4–4.8)
PROTEIN TOTAL: 7.9 g/dL (ref 5.7–8.2)
SODIUM: 139 mmol/L (ref 135–145)

## 2023-03-29 LAB — LIPID PANEL
CHOLESTEROL/HDL RATIO SCREEN: 2.7 (ref 1.0–4.5)
CHOLESTEROL: 83 mg/dL (ref <=200)
HDL CHOLESTEROL: 31 mg/dL — ABNORMAL LOW (ref 40–60)
LDL CHOLESTEROL CALCULATED: 13 mg/dL — ABNORMAL LOW (ref 40–99)
NON-HDL CHOLESTEROL: 52 mg/dL — ABNORMAL LOW (ref 70–130)
TRIGLYCERIDES: 195 mg/dL — ABNORMAL HIGH (ref 0–150)
VLDL CHOLESTEROL CAL: 39 mg/dL (ref 12–42)

## 2023-03-29 LAB — AFP TUMOR MARKER: AFP-TUMOR MARKER: <2 ng/mL (ref <=8)

## 2023-03-29 LAB — PHOSPHORUS: PHOSPHORUS: 3.9 mg/dL (ref 2.4–5.1)

## 2023-03-29 LAB — BILIRUBIN, DIRECT: BILIRUBIN DIRECT: 0.1 mg/dL (ref 0.00–0.30)

## 2023-03-29 LAB — MAGNESIUM: MAGNESIUM: 1.7 mg/dL (ref 1.6–2.6)

## 2023-03-29 LAB — GAMMA GT: GAMMA GLUTAMYL TRANSFERASE: 32 U/L (ref 0–73)

## 2023-03-29 LAB — LAB REPORT - SCANNED
A1c: 6.4
Albumin, Urine POC: 1.2
Creatinine, POC: 73.6 mg/dL
EGFR: 69
Microalb Creat Ratio: 16.3

## 2023-03-29 NOTE — Unmapped (Signed)
ASSESSMENT and PLAN:   40 M s/p liver transplant 2022, recent MRI liver with some steatosis in transplant, obesity, T2DM  A1C 6.4% today and doing well    1. Diabetes, post-transplant:  STOP pioglitazone   Continue Jardiance 25 mg PO daily.  Continue Tradjenta 5 mg PO once daily.  confirmed with [employer] that patient is approved through 02/23/24 for Tradjenta 5 mg and Jardiance 25 mg #90 tablets for 90 day supply and is opted into automatic refills.  Reviewed signs/symptoms/treatment of hypoglycemia.   Continue Freestyle Libre 3 CGM after phone upgraded.   Microalb today  Optho: scheduled  Neuropathy: no neuropathy but has been feeling some ankle and arch pain. Suggested some arch support insoles    2. Hypothyroid:  Continue LT4 175    RTC in 6 months, if still stable then could discharge to PCP management    SUBJECTIVE:   Anthony Alvarado is a 63 y.o. year old male with a history of diabetes post-transplant who presents today for a diabetes-related visit. PMH includes liver transplant (2022), hepatic steatosis, obesity, hypothyroidism, and HTN, CAD s/p CABG (2023)    INTERIM:  Significant improvement, off insulin now  On Trajenta, pio and jardiance  No or rare hypos    Fasting AM: 120's    DM history:  2018: had pre-DM before liver transplant and controlled with diet, transiently on insulin but able to stop  2020: Liver transplant and since transplant has been on MTF and jardiance    Weight: since transplant has gained about 25 pounds; minimal exercise due to habit  Diet: cereal for breakfast, fast food at least once a day because too tired to cook    PCP Dr. Para March has been managing DM, last labs in 01/01/22 reviewed ([facility])  01/01/22 7.2%  No retinopathy: just checked by optho  No neuropathy: monofilament checked in Nov 23      Past Medical History:   Diagnosis Date    Aortic valve stenosis     Autoimmune cholangitis     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    Cholestatic cirrhosis (CMS-HCC)     Hepatic encephalopathy (CMS-HCC)     Hypertension     Other osteoporosis without current pathological fracture     Pneumonia 10/2022    Sleep apnea     Type 2 diabetes mellitus (CMS-HCC)        Current Outpatient Medications   Medication Sig Dispense Refill    amoxicillin-clavulanate (AUGMENTIN) 875-125 mg per tablet Take 1 mg by mouth Two (2) times a day (at 8am and 12:00).      aspirin 325 MG tablet Take 1 tablet (325 mg total) by mouth daily.      azelastine (ASTELIN) 137 mcg (0.1 %) nasal spray 1 spray into each nostril daily as needed.      blood sugar diagnostic (ONETOUCH ULTRA TEST) Strp USE TO CHECK SUGAR DAILY      blood-glucose sensor (FREESTYLE LIBRE 3 SENSOR) Devi For use as directed. change every 14 days. 3 each 11    calcium carbonate (TUMS) 200 mg calcium (500 mg) chewable tablet Chew 1 tablet (500 mg total) daily. Pt reports taking every other day.      doxycycline (VIBRA-TABS) 100 MG tablet Take 1 tablet (100 mg total) by mouth.      empagliflozin (JARDIANCE) 25 mg tablet Take 1 tablet (25 mg total) by mouth daily.      fluticasone propionate (FLONASE)  50 mcg/actuation nasal spray PLACE ONE OR TWO SPRAYS INTO BOTH NOSTRILS DAILY AS NEEDED.      glucose 4 GM chewable tablet Chew 4 tablets (16 g total) every ten (10) minutes as needed for low blood sugar ((For Blood Glucose LESS than 70 mg/dL and GREATER than or EQUAL to 54 mg/dL and able to take by mouth.)). 50 tablet 12    glycerin/witch hazel leaf (HEMORRHOID TOP) Apply topically.      hydrocortisone 1 % crpe APPLY TO AFFECTED AREA TWICE A DAY      levothyroxine (SYNTHROID) 175 MCG tablet Take 1 tablet (175 mcg total) by mouth daily before breakfast. 90 tablet 2    linagliptin (TRADJENTA) 5 mg Tab Take 1 tablet (5 mg total) by mouth daily.      metoPROLOL tartrate (LOPRESSOR) 25 MG tablet Take 1 tablet (25 mg total) by mouth two (2) times a day. 180 tablet 1    pen needle, diabetic 32 gauge x 5/32 (4 mm) Ndle Use with insulin up to 4 times/day as needed. 100 each 1    pioglitazone (ACTOS) 30 MG tablet Take 1 tablet (30 mg total) by mouth daily. 90 tablet 3    pravastatin (PRAVACHOL) 40 MG tablet Take 1 tablet (40 mg total) by mouth every evening. 30 tablet 2    sertraline (ZOLOFT) 50 MG tablet Take 1 tablet (50 mg total) by mouth daily. 90 tablet 3    tacrolimus (PROGRAF) 1 MG capsule Take 3 capsules (3 mg total) by mouth two (2) times a day. 180 capsule 11     No current facility-administered medications for this visit.     Facility-Administered Medications Ordered in Other Visits   Medication Dose Route Frequency Provider Last Rate Last Admin    diazePAM (VALIUM) 5 mg/mL injection                Allergies   Allergen Reactions    Mycophenolate Mofetil Diarrhea     Mycophenolate colitis     Watermelon Flavor      Mouth itching       Social History  Social History     Socioeconomic History    Marital status: Legally Separated     Spouse name: tammy    Number of children: 1   Tobacco Use    Smoking status: Never    Smokeless tobacco: Never   Vaping Use    Vaping status: Never Used   Substance and Sexual Activity    Alcohol use: Not Currently    Drug use: Never    Sexual activity: Yes     Partners: Female   Other Topics Concern    Do you use sunscreen? Yes    Tanning bed use? No    Are you easily burned? Yes    Excessive sun exposure? No    Blistering sunburns? Yes     Social Drivers of Health     Financial Resource Strain: Medium Risk (01/04/2023)    Received from [facility]    Overall Financial Resource Strain (CARDIA)     Difficulty of Paying Living Expenses: Somewhat hard   Food Insecurity: No Food Insecurity (01/04/2023)    Received from [facility]    Hunger Vital Sign     Worried About Running Out of Food in the Last Year: Never true     Ran Out of Food in the Last Year: Never true   Transportation Needs: No Transportation Needs (01/04/2023)    Received from [facility]    PRAPARE - Therapist, art (Medical): No Lack of Transportation (Non-Medical): No   Physical Activity: Insufficiently Active (01/04/2023)    Received from [facility]    Exercise Vital Sign     Days of Exercise per Week: 1 day     Minutes of Exercise per Session: 10 min   Stress: No Stress Concern Present (01/04/2023)    Received from [facility] - Occupational Stress Questionnaire     Feeling of Stress : Only a little   Social Connections: Moderately Integrated (01/04/2023)    Received from [facility]    Social Connection and Isolation Panel [NHANES]     Frequency of Communication with Friends and Family: More than three times a week     Frequency of Social Gatherings with Friends and Family: Twice a week     Attends Religious Services: More than 4 times per year     Active Member of [employer] or Organizations: Yes     Attends Banker Meetings: 1 to 4 times per year     Marital Status: Separated        Family History  No DM in family    Social Hx:  On disability  Smoke: none  EtOH: none       Review of Systems  A 10 systems reviewed and were negative except for pertinent items noted in the HPI and below:    Eyes: denies blurred vision; last dilated eye exam showed no diabetic retinopathy  CVS: denies CP   PULM:  denies SOB     OBJECTVE:     There were no vitals filed for this visit.       Gen:  In no apparent distress, appears consistent with stated age   Eyes:  Anicteric, EOMI  Pulm: equal and symmetric respiratory effort  Abd: soft, non-tender, non-distended   MUSK: normal station and gait  Psych:  Alert and oriented x 4; mood is not down, depressed or anxious    Lab Review    DIABETES MELLITUS RESULTS:    Hemoglobin A1C (%)   Date Value   09/15/2022 7.4 (H)   04/06/2022 7.4 (H)   09/18/2019 6.6 (H)     Glucose, POC (mg/dL)   Date Value   81/19/1478 196 (H)   11/04/2022 278 (H)   11/04/2022 146         Lab Results   Component Value Date    CREATININE 1.09 01/01/2023     Lab Results   Component Value Date    CHOL 122 04/17/2019     Lab Results   Component Value Date    HDL 33 (L) 04/17/2019     No components found for: LDLCALC, DIRECTLDL  Lab Results   Component Value Date    TRIG 185 (H) 04/17/2019     Lab Results   Component Value Date    TSH 1.440 06/19/2019     Creatinine (mg/dL)   Date Value   29/56/2130 1.09   11/20/2022 0.89   11/04/2022 0.80   11/03/2022 0.78   10/30/2022 1.21   10/21/2022 0.81

## 2023-03-29 NOTE — Unmapped (Signed)
Freestyle connected, in Careers information officer. Last data from March 16, 2023.  BG done today.  A1C done today.  PP: 12pm  BG: 184 mg/dL

## 2023-03-30 DIAGNOSIS — Z944 Liver transplant status: Principal | ICD-10-CM

## 2023-03-30 LAB — TACROLIMUS LEVEL: TACROLIMUS BLOOD: 8.5 ng/mL

## 2023-03-30 NOTE — Unmapped (Signed)
Patient's 2/3 lab with mild bump in Cr and K. Tac level resulted above goal, but labs were drawn in the afternoon. Contacted patient, who denied any sx of n/v/d, but admitted he had taken his Tac before labs. Encouraged him to assure he is maintaining good hydration. He verbalized understanding and stated he planned to repeat labs again in another couple weeks.

## 2023-03-31 ENCOUNTER — Ambulatory Visit: Payer: Medicare Other | Attending: Internal Medicine | Admitting: Internal Medicine

## 2023-03-31 ENCOUNTER — Ambulatory Visit (INDEPENDENT_AMBULATORY_CARE_PROVIDER_SITE_OTHER): Payer: Medicare Other

## 2023-03-31 ENCOUNTER — Encounter: Payer: Self-pay | Admitting: Internal Medicine

## 2023-03-31 VITALS — BP 126/88 | HR 66 | Ht 68.0 in | Wt 195.8 lb

## 2023-03-31 DIAGNOSIS — R002 Palpitations: Secondary | ICD-10-CM

## 2023-03-31 NOTE — Patient Instructions (Addendum)
 Medication Instructions:  Your physician has recommended you make the following change in your medication:   Hold pravastatin .   Lab Work: None ordered.  If you have labs (blood work) drawn today and your tests are completely normal, you will receive your results only by: MyChart Message (if you have MyChart) OR A paper copy in the mail If you have any lab test that is abnormal or we need to change your treatment, we will call you to review the results.  Testing/Procedures: Your physician has requested that you wear a Zio heart monitor for 14days. This will be mailed to your home with instructions on how to apply the monitor and how to return it when finished. Please allow 2 weeks after returning the heart monitor before our office calls you with the results.   ZIO XT- Long Term Monitor Instructions     Your physician has requested you wear a ZIO patch monitor for 14 days.  This is a single patch monitor. [employer] supplies one patch monitor per enrollment. Additional  stickers are not available. Please do not apply patch if you will be having a Nuclear Stress Test,  Echocardiogram, Cardiac CT, MRI, or Chest Xray during the period you would be wearing the  monitor. The patch cannot be worn during these tests. You cannot remove and re-apply the  ZIO XT patch monitor.  Your ZIO patch monitor will be mailed 3 day USPS to your address on file. It may take 3-5 days  to receive your monitor after you have been enrolled.  Once you have received your monitor, please review the enclosed instructions. Your monitor  has already been registered assigning a specific monitor serial # to you.     Billing and Patient Assistance Program Information     We have supplied [employer] with any of your insurance information on file for billing purposes.  [employer] offers a sliding scale Patient Assistance Program for patients that do not have  insurance, or whose insurance does not completely cover the cost of  the ZIO monitor.  You must apply for the Patient Assistance Program to qualify for this discounted rate.  To apply, please call [employer] at [phone], select option 4, select option 2, ask to apply for  Patient Assistance Program. Meredeth will ask your household income, and how many people  are in your household. They will quote your out-of-pocket cost based on that information.  [employer] will also be able to set up a 25-month, interest-free payment plan if needed.     Applying the monitor     Shave hair from upper left chest.  Hold abrader disc by orange tab. Rub abrader in 40 strokes over the upper left chest as  indicated in your monitor instructions.  Clean area with 4 enclosed alcohol pads. Let dry.  Apply patch as indicated in monitor instructions. Patch will be placed under collarbone on left  side of chest with arrow pointing upward.  Rub patch adhesive wings for 2 minutes. Remove white label marked 1. Remove the white  label marked 2. Rub patch adhesive wings for 2 additional minutes.  While looking in a mirror, press and release button in center of patch. A small green light will  flash 3-4 times. This will be your only indicator that the monitor has been turned on.  Do not shower for the first 24 hours. You may shower after the first 24 hours.  Press the button if you feel a symptom. You will hear a small  click. Record Date, Time and  Symptom in the Patient Logbook.  When you are ready to remove the patch, follow instructions on the last 2 pages of Patient  Logbook. Stick patch monitor onto the last page of Patient Logbook.  Place Patient Logbook in the blue and white box. Use locking tab on box and tape box closed  securely. The blue and white box has prepaid postage on it. Please place it in the mailbox as  soon as possible. Your physician should have your test results approximately 7 days after the  monitor has been mailed back to [facility].  Call [facility]  Customer Care at [phone] if you have questions regarding  your ZIO XT patch monitor. Call them immediately if you see an orange light blinking on your  monitor.  If your monitor falls off in less than 4 days, contact our Monitor department at [phone].  If your monitor becomes loose or falls off after 4 days call [employer] at [phone] for  suggestions on securing your monitor.    Follow-Up: At [facility], you and your health needs are our priority.  As part of our continuing mission to provide you with exceptional heart care, we have created designated Provider Care Teams.  These Care Teams include your primary Cardiologist (physician) and Advanced Practice Providers (APPs -  Physician Assistants and Nurse Practitioners) who all work together to provide you with the care you need, when you need it.  Your next appointment:   October 2025  The format for your next appointment:   In Person  Provider:   Vina Gull, MD  Important Information About Sugar

## 2023-03-31 NOTE — Progress Notes (Unsigned)
 Enrolled for [employer] to mail a ZIO XT long term holter monitor to the patients address on file.

## 2023-04-04 DIAGNOSIS — R002 Palpitations: Secondary | ICD-10-CM | POA: Diagnosis not present

## 2023-04-13 DIAGNOSIS — Z1211 Encounter for screening for malignant neoplasm of colon: Principal | ICD-10-CM

## 2023-04-13 DIAGNOSIS — Z8719 Personal history of other diseases of the digestive system: Principal | ICD-10-CM

## 2023-04-13 NOTE — Unmapped (Signed)
 Called patient regarding timing of repeat colonoscopy for the purposes of CRC screening. Discussed with Dr. Rudie Meyer and recommended deferring repeat colonoscopy for about a year since last colonoscopy procedure, since he did not have optimal visualization of mucosa, with severe mucosal changes. Order placed. Discussed with patient timing to schedule. Will mychart phone number to schedule colonoscopy.    Forwarded to Dr. Delilah Shan V. Ollen Gross, MD  Gastroenterology & Hepatology Fellow  [place] [facility] [place] [place]

## 2023-04-13 NOTE — Unmapped (Signed)
 [facility] Refill Coordination Note    Anthony Alvarado, [place]: [birth year]  Phone: [phone] (home)       All above HIPAA information was verified with patient.         04/08/2023     9:56 AM   Specialty Rx Medication Refill Questionnaire   Which Medications would you like refilled and shipped? tacrolimus - PRAVISTATIN - FREESTYLE LYBRA -   Please list all current allergies: SAME   Have you missed any doses in the last 30 days? Yes   If Yes, how many doses have you missed ? 0-2   Have you had any changes to your medication(s) since your last refill? No   How many days remaining of each medication do you have at home? 1 WEEK   Have you experienced any side effects in the last 30 days? No   Please enter the full address (street address, city, state, zip code) where you would like your medication(s) to be delivered to. [address], [place], [place] [address]   Please specify on which day you would like your medication(s) to arrive. Note: if you need your medication(s) within 3 days, please call the pharmacy to schedule your order at [phone]  04/12/2023   Has your insurance changed since your last refill? No   Would you like a pharmacist to call you to discuss your medication(s)? No   Do you require a signature for your package? (Note: if we are billing Medicare Part B or your order contains a controlled substance, we will require a signature) No   Additional Comments: Would like 90 day supply on the ones that it is available         Completed refill call assessment today to schedule patient's medication shipment from the [facility] [phone]).  All relevant notes have been reviewed.       Confirmed patient received a Conservation officer, historic buildings and a Surveyor, mining with first shipment. The patient will receive a drug information handout for each medication shipped and additional FDA Medication Guides as required. REFERRAL TO PHARMACIST     Referral to the pharmacist: Not needed      [facility]     Shipping address confirmed in Epic.     Delivery Scheduled: Yes, Expected medication delivery date: 04/15/2023.     Medication will be delivered via UPS to the prescription address in Epic WAM.    [name]   [facility] Specialty Technician

## 2023-04-14 MED FILL — PRAVASTATIN 40 MG TABLET: ORAL | 30 days supply | Qty: 30 | Fill #2

## 2023-04-14 MED FILL — FREESTYLE LIBRE 3 PLUS SENSOR DEVICE: 28 days supply | Qty: 2 | Fill #4

## 2023-04-14 MED FILL — TACROLIMUS 1 MG CAPSULE, IMMEDIATE-RELEASE: ORAL | 30 days supply | Qty: 180 | Fill #5

## 2023-04-15 DIAGNOSIS — Z944 Liver transplant status: Principal | ICD-10-CM

## 2023-04-15 DIAGNOSIS — D72829 Elevated white blood cell count, unspecified: Principal | ICD-10-CM

## 2023-04-15 NOTE — Unmapped (Signed)
 Patient with intermittent leukocytosis since prior to txp in the absence of fever, chills or sx of illness. Per PA Edyth Gunnels, patient should be referred to Hematology for evaluation. Spoke to patient and let him know that she was recommending this referral and to expect a call from them in the next couple weeks. He verbalized understanding and agreed to contact this TNC if he does not hear from their scheduler in the next couple weeks.

## 2023-04-19 NOTE — Unmapped (Signed)
 Patient left mychart message describing several bowel movements with a lot of blood. He has been having more loose bowel movements recently as well. His bloody bowel movements resolved this AM.  He denies any lightheadedness dizziness or abdominal pain. He had a similar episode like this over last summer and the fall. History of mycophenolate induced colitis, which was resolved with holding the medication and hemorrhoidal bleeding. Discussed that it could be from hemorrhoids but if it is a lot of blood like he said it could be other possibilities. If he has recurrent episodes of significant hematochezia counseled him to present to the ED for further evaluation. He will also try to reach his hemorrhoid doctor.     I have copied his transplant team.    Kathee Delton. Ollen Gross, MD  Gastroenterology & Hepatology Fellow  [place] [facility] [place], [place]

## 2023-04-20 DIAGNOSIS — Z944 Liver transplant status: Principal | ICD-10-CM

## 2023-04-21 DIAGNOSIS — Z944 Liver transplant status: Principal | ICD-10-CM

## 2023-04-21 NOTE — Unmapped (Signed)
 Received msg from Drs.Sherryll Burger and Lupu, that the patient had reached out to there latter after noting blood in his stool on Sunday night. Patient told the provider it resolved on Monday, though.     Contacted patient today, who reported that that while he continues to have diarrhea, he no longer has any bleeding. He had not yet reached out to his hemorrhoid provider as instructed, though. He states that he developed increased bowel activity about 2-3 wks ago, going from 2-3 bms daily to 5-6 bms with some water stools. He notes having mildly cramping just before passing stool, but no abdominal pain or fever otherwise. Encouraged him to obtain some specimen cups and take a sample to [facility] for testing, assuring that it is taken within 24 hrs and refrigerated if it cannot be taken immediately. He agreed to this and said he would contact his GI provider and his hemorrhoid doctor for appts and owuld notify this TNC when he takes in the sample.     Per Dr.Shah, stool should be tested for fecal elastase, calprotectin, pathogen screen, and CDiff. Sent list of tests to patient, so he could assure all testing was ordered at [facility].

## 2023-04-22 DIAGNOSIS — D849 Immunodeficiency, unspecified: Secondary | ICD-10-CM | POA: Diagnosis not present

## 2023-04-22 DIAGNOSIS — R197 Diarrhea, unspecified: Secondary | ICD-10-CM | POA: Diagnosis not present

## 2023-04-22 DIAGNOSIS — Z944 Liver transplant status: Secondary | ICD-10-CM | POA: Diagnosis not present

## 2023-04-22 NOTE — Unmapped (Signed)
 Colonoscopy  Procedure #1     Procedure #2   [id number]  MRN   Sherryll Burger preferred  Endoscopist     Is the patient's health insurance [employer], [employer], [place] [facility]), [employer] [place], [employer], or [employer]?     Urgent procedure     Are you pregnant?     Are you in the process of scheduling or awaiting results of a heart ultrasound, stress test, or catheterization to evaluate new or worsening chest pain, dizziness, or shortness of breath?     Do you take: Plavix (clopidogrel), Coumadin (warfarin), Lovenox (enoxaparin), Pradaxa (dabigatran), Effient (prasugrel), Xarelto (rivaroxaban), Eliquis (apixaban), Pletal (cilostazol), or Brilinta (ticagrelor)?          Did ordering provider indicate how long to hold this medication in the order comments?          Which of the above medications are you taking?          What is the name of the medical practice that manages this medication?          What is the name of the medical provider who manages this medication?     Do you have hemophilia, von Willebrand disease, or low platelets?     Do you have a pacemaker or implanted cardiac defibrillator?     Has a [facility] provider specified the location(s)?     Which location(s) did the [facility] provider specify?          [facility]          [place]          [facility]     Do you see a liver specialist for chronic liver disease?     Is the procedure indication for variceal screening?     Is procedure indication for variceal banding (this does NOT include variceal screening)?     Have you had a heart attack, stroke or heart stent placement within the past 6 months?     Month of event     Year of event (ONLY ENTER LAST 2 DIGITS)        5  Height (feet)   9  Height (inches)   195  Weight (pounds)   28.8  BMI          Did the ordering provider specify a bowel prep?          What bowel prep was specified?     Do you have an ostomy (bag on your stomach that collects your stool)?          Is it an ileostomy?          Is it a colostomy?          Patient doesn't know.     Do you have chronic kidney disease?     Do you have chronic constipation or have you had poor quality bowel preps for past colonoscopies?     Do you have Crohn's disease or ulcerative colitis?     Have you had weight loss surgery?          When you walk around your house or grocery store, do you have to stop and rest due to shortness of breath, chest pain, or light-headedness?     Do you ever use supplemental oxygen?     Have you been hospitalized for cirrhosis of the liver or heart failure in the last 12 months?     Have you been treated for mouth or  throat cancer with radiation or surgery?     Have you been told that it is difficult for doctors to insert a breathing tube in you during anesthesia?     Have you had a heart or lung transplant?          Are you on dialysis?     Do you have cirrhosis of the liver?     Do you have myasthenia gravis?     Is the patient a prisoner?   ################# ## ###################################################################################################################   MRN:  [id number]   Anticoag Review  No   Nurse Triage  No   GI clinic consult  No   Procedure(s):  Colonoscopy     0   Endoscopist:  Sherryll Burger preferred   Urgent:  No   Prep:  Nulytely Prep                  --------------------------- --- ----------------------------------------------------------------------------------------------------------------------------------------------------------------------------   [facility] Locations:  [facility]     [facility]     [place]        Requested Locations:              ################# ## ###################################################################################################################

## 2023-04-23 MED ORDER — PEG 3350-ELECTROLYTES 236 GRAM-22.74 GRAM-6.74 GRAM-5.86 GRAM SOLUTION
ORAL | 0 refills | 0.00 days | Status: CP
Start: 2023-04-23 — End: 2023-04-23
  Filled 2023-05-25: qty 2, 28d supply, fill #5

## 2023-04-27 DIAGNOSIS — R002 Palpitations: Secondary | ICD-10-CM | POA: Diagnosis not present

## 2023-04-28 ENCOUNTER — Encounter: Payer: Self-pay | Admitting: Internal Medicine

## 2023-04-29 NOTE — Unmapped (Signed)
 Called patient to discuss results of stool studies.     Fecal calpro is 7,300 significantly high  Fecal elastase is low 28  Negative stool studies infectious work up    His symptoms have gotten slightly better. He is no longer having blood in the stool but continues to have 4-6 episodes of diarrhea. Denies fevers, chills, loss of appetite, nausea, or vomiting. Last albumin checked was 3.6 03/2023.     We will try to move his colonoscopy sooner than scheduled for evaluation of IBD. I discussed return precautions with him to present to the hospital with worsened symptoms.     Kathee Delton Ollen Gross, MD  Gastroenterology & Hepatology Fellow  [place] [facility] [place]     Forwarded to Dr. Sherryll Burger, Emilio Math

## 2023-05-03 ENCOUNTER — Ambulatory Visit: Admit: 2023-05-03 | Discharge: 2023-05-04 | Payer: MEDICARE

## 2023-05-03 DIAGNOSIS — K644 Residual hemorrhoidal skin tags: Secondary | ICD-10-CM | POA: Diagnosis not present

## 2023-05-03 NOTE — Unmapped (Signed)
 Anthony Alvarado  62 y.o. [birth year]  Phone: [phone] (home)   Address: [address]  [facility] [place] [address]   MRN: [id number]  Primary MD : Referred Self   [facility] at [facility]     Problem List Items Addressed This Visit          Digestive    Hemorrhoids, external - Primary    Improved with medical treatment  [name] hemorrhoid compound as needed  Supplement diet with Water (64 oz) & Fiber (30g) daily  Avoid constipation & Straining  Consider banding of inner component of hemorrhoid at the time of colonoscopy  Follow up in 6 months                   Hemorrhoid Consultation Note    Past Medical History:   Diagnosis Date    Aortic valve stenosis     Autoimmune cholangitis     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    Cholestatic cirrhosis (CMS-HCC)     Hepatic encephalopathy (CMS-HCC)     Hypertension     Other osteoporosis without current pathological fracture     Pneumonia 10/2022    Sleep apnea     Type 2 diabetes mellitus (CMS-HCC)      Patient Active Problem List   Diagnosis    Diabetes mellitus without complication (CMS-HCC)    Hypothyroidism    Cirrhosis of liver without ascites (CMS-HCC)    OSA (obstructive sleep apnea)    Essential hypertension    Encounter for pre-transplant evaluation for chronic liver disease    Pleural effusion    Pneumothorax after biopsy    Liver transplant recipient (CMS-HCC)    Allergic rhinitis    Arthralgia    Asthma    Bicuspid aortic valve    Flying phobia    Mucosal abnormality of stomach    Nephrolithiasis    Pure hypercholesterolemia    Umbilical hernia    Unspecified hearing loss    COVID-19    Colitis    Hemorrhoids, external    Steatosis of liver    Steroid-induced hyperglycemia    Leukocytosis    Hyponatremia with increased serum osmolality     Past Surgical History:   Procedure Laterality Date    APPENDECTOMY      CHG US GUIDE, TISSUE ABLATION N/A 05/03/2018    Procedure: ULTRASOUND GUIDANCE FOR, AND MONITORING OF, PARENCHYMAL TISSUE ABLATION;  Surgeon: Particia Nearing, MD;  Location: MAIN OR [facility];  Service: Transplant    LIVER TRANSPLANTATION  09/16/2018    PR CATH PLACE/CORON ANGIO, IMG SUPER/INTERP,R&L HRT CATH, L HRT VENTRIC N/A 03/21/2018    Procedure: Left/Right Heart Catheterization;  Surgeon: Neal Dy, MD;  Location: [facility] CATH;  Service: Cardiology    PR COLONOSCOPY W/BIOPSY SINGLE/MULTIPLE N/A 09/09/2022    Procedure: COLONOSCOPY, FLEXIBLE, PROXIMAL TO SPLENIC FLEXURE; WITH BIOPSY, SINGLE OR MULTIPLE;  Surgeon: Jules Husbands, MD;  Location: GI PROCEDURES [facility];  Service: Gastroenterology    PR DECORTICATION,PULMONARY,TOTAL Right 01/13/2019    Procedure: Decortic Pulm (Separt Proc); Tot;  Surgeon: Evert Kohl, MD;  Location: MAIN OR [facility];  Service: Thoracic    PR EGD FLEXIBLE FOREIGN BODY REMOVAL N/A 12/26/2018    Procedure: UGI ENDOSCOPY; W/REMOVAL FOREIGN BODY;  Surgeon: Vonda Antigua, MD;  Location: GI PROCEDURES [facility];  Service: Gastroenterology    PR LAP,ABLAT 1+ LIVER TUMOR(S),RADIOFREQ N/A 05/03/2018    Procedure:  LAPAROSCOPY, SURGICAL, ABLATION OF 1 OR MORE LIVER TUMOR(S); RADIOFREQUENCY;  Surgeon: Particia Nearing, MD;  Location: MAIN OR [place];  Service: Transplant    PR PERQ DRAINAGE PLEURA INSERT CATH W/IMAGING N/A 01/11/2019    Procedure: PLEURAL DRAINAGE, PERC, W INSERTION OF INDWELLING CATHETER; W IMAGING GUIDANCE;  Surgeon: Jerelyn Charles, MD;  Location: BRONCH PROCEDURE LAB [facility];  Service: Pulmonary    PR SIGMOIDOSCOPY FLX DX W/COLLJ SPEC BR/WA IF PFRMD N/A 10/02/2022    Procedure: SIGMOIDOSCOPY, FLEXIBLE; DIAGNOSTIC, WITH OR WITHOUT COLLECTION OF SPECIMEN(S) BY BRUSHING OR WASHING;  Surgeon: Cira Servant, DO;  Location: [facility];  Service: Gastroenterology    PR THORACENTESIS NEEDLE/CATH PLEURA W/IMAGING N/A 01/06/2019    Procedure: THORACENTESIS W/ IMAGING;  Surgeon: Cleora Fleet, MD;  Location: BRONCH PROCEDURE LAB [facility];  Service: Pulmonary    PR THORACOSCOPY SURG W/PLEURODESIS Right 01/13/2019    Procedure: THORACOSCOPY, SURGICAL; WITH PLEURODESIS (EG, MECHANICAL OR CHEMICAL);  Surgeon: Evert Kohl, MD;  Location: MAIN OR [facility];  Service: Thoracic    PR TRANSPLANT LIVER,ALLOTRANSPLANT Bilateral 09/16/2018    Procedure: LIVER ALLOTRANSPLANTATION; ORTHOTOPIC, PARTIAL OR WHOLE, FROM CADAVER OR LIVING DONOR, ANY AGE;  Surgeon: Florene Glen, MD;  Location: MAIN OR [place];  Service: Transplant    PR TRANSPLANT,PREP DONOR LIVER, WHOLE N/A 09/16/2018    Procedure: BACKBNCH STD PREP CAD DONOR WHOLE LIVER GFT PRIOR TNSPLNT,INC CHOLE,DISS/REM SURR TISSU WO TRISEG/LOBE SPLT;  Surgeon: Florene Glen, MD;  Location: MAIN OR ;  Service: Transplant    SKIN BIOPSY         Allergies   Allergen Reactions    Mycophenolate Mofetil Diarrhea     Mycophenolate colitis     Watermelon Flavor      Mouth itching         Current Outpatient Medications:     azelastine (ASTELIN) 137 mcg (0.1 %) nasal spray, 1 spray into each nostril daily as needed., Disp: , Rfl:     blood sugar diagnostic (ONETOUCH ULTRA TEST) Strp, USE TO CHECK SUGAR DAILY, Disp: , Rfl:     blood-glucose sensor (FREESTYLE LIBRE 3 SENSOR) Devi, For use as directed. change every 14 days., Disp: 3 each, Rfl: 11    calcium carbonate (TUMS) 200 mg calcium (500 mg) chewable tablet, Chew 1 tablet (500 mg total) daily. Pt reports taking every other day., Disp: , Rfl:     empagliflozin (JARDIANCE) 25 mg tablet, Take 1 tablet (25 mg total) by mouth daily., Disp: , Rfl:     fluticasone propionate (FLONASE) 50 mcg/actuation nasal spray, PLACE ONE OR TWO SPRAYS INTO BOTH NOSTRILS DAILY AS NEEDED., Disp: , Rfl:     glucose 4 GM chewable tablet, Chew 4 tablets (16 g total) every ten (10) minutes as needed for low blood sugar ((For Blood Glucose LESS than 70 mg/dL and GREATER than or EQUAL to 54 mg/dL and able to take by mouth.))., Disp: 50 tablet, Rfl: 12    glycerin/witch hazel leaf (HEMORRHOID TOP), Apply topically., Disp: , Rfl:     levothyroxine (SYNTHROID) 175 MCG tablet, Take 1 tablet (175 mcg total) by mouth daily before breakfast., Disp: 90 tablet, Rfl: 2    linagliptin (TRADJENTA) 5 mg Tab, Take 1 tablet (5 mg total) by mouth daily., Disp: , Rfl:     metoPROLOL tartrate (LOPRESSOR) 25 MG tablet, Take 1 tablet (25 mg total) by mouth two (2) times a day., Disp: 180 tablet, Rfl: 1    pen needle, diabetic 32  gauge x 5/32 (4 mm) Ndle, Use with insulin up to 4 times/day as needed., Disp: 100 each, Rfl: 1    polyethylene glycol (GOLYTELY) 236-22.74-6.74 gram solution, Take 4,000 mL by mouth Take as directed for 1 dose. Take as directed per [facility] instructions, Disp: 4000 mL, Rfl: 0    pravastatin (PRAVACHOL) 40 MG tablet, Take 1 tablet (40 mg total) by mouth every evening., Disp: 30 tablet, Rfl: 2    sertraline (ZOLOFT) 50 MG tablet, Take 1 tablet (50 mg total) by mouth daily., Disp: 90 tablet, Rfl: 3    tacrolimus (PROGRAF) 1 MG capsule, Take 3 capsules (3 mg total) by mouth two (2) times a day., Disp: 180 capsule, Rfl: 11    amoxicillin-clavulanate (AUGMENTIN) 875-125 mg per tablet, Take 1 mg by mouth Two (2) times a day (at 8am and 12:00). (Patient not taking: Reported on 05/03/2023), Disp: , Rfl:     aspirin 325 MG tablet, Take 1 tablet (325 mg total) by mouth daily., Disp: , Rfl:     doxycycline (VIBRA-TABS) 100 MG tablet, Take 1 tablet (100 mg total) by mouth. (Patient not taking: Reported on 05/03/2023), Disp: , Rfl:     hydrocortisone 1 % crpe, APPLY TO AFFECTED AREA TWICE A DAY (Patient not taking: Reported on 05/03/2023), Disp: , Rfl:   No current facility-administered medications for this visit.    Facility-Administered Medications Ordered in Other Visits:     diazePAM (VALIUM) 5 mg/mL injection, , , ,      reports that he has never smoked. He has never used smokeless tobacco. He reports that he does not currently use alcohol. He reports that he does not use drugs.    He indicated that his mother is deceased. He indicated that his father is deceased. He indicated that two of his three sisters are alive. He indicated that the status of his maternal grandfather is unknown. He indicated that his daughter is alive. He indicated that the status of his neg hx is unknown.           HPI:    Patient ID: Anthony Alvarado, 63 y.o., male  Duration: several years (patient has prior history of cirrhosis status post liver transplant)  Location:external, bleeding  Severity:severe  Associated symptoms: external bleeding hemorrhoids  Alleviating factors: Resolution of colitis and diarrhea  Aggravating factors: Diarrhea and the patient's had a recent bout of colitis beginning in June 2024 with multiple episodes of diarrhea which is aggravated the hemorrhoids resulting in hematochezia patient was hospitalized  .  Daily aspirin use 325 mg  Last colonoscopy:10/02/2022 flexible sigmoidoscopy -hemorrhoids found on perianal exam, solid stool found in sigmoid colon precluding visualization, rectum rectosigmoid colon and sigmoid colon otherwise appeared normal  Previous Treatment: Preparation H, stool softeners, fiber supplements, bleeding has subsided after resolution of colitis and diarrhea    05/03/2023  Patient admits to intermittent rectal bleeding coinciding with loose stools.  Hemorrhoids are currently asymptomatic.  He reports good success with use of [facility] hemorrhoid compound. Last colonoscopy August 2024 (incomplete due to poor prep). Report colonoscopy planned for April 2025 to evaluate for IBD.    Review of Systems   Constitutional:  Negative for chills, fever, malaise/fatigue and weight loss.   HENT:  Negative for congestion and sore throat.    Eyes:  Negative for double vision, photophobia and redness.   Respiratory:  Negative for cough, hemoptysis, shortness of breath, wheezing and stridor.    Cardiovascular:  Negative for chest pain, palpitations,  orthopnea, claudication and leg swelling.   Gastrointestinal:  Positive for blood in stool and diarrhea. Negative for abdominal pain, constipation, melena, nausea and vomiting.   Genitourinary:  Negative for frequency, hematuria and urgency.   Musculoskeletal:  Negative for joint pain.   Skin:  Negative for itching and rash.   Neurological:  Negative for dizziness, tremors, focal weakness, seizures, loss of consciousness, weakness and headaches.   Endo/Heme/Allergies:  Negative for polydipsia. Does not bruise/bleed easily.   Psychiatric/Behavioral:  Negative for memory loss and suicidal ideas. The patient does not have insomnia.      PHYSICAL EXAM:   BP 133/87  - Pulse 67  - Temp 36.3 ??C (97.3 ??F)  - Ht 172.7 cm (5' 8)  - Wt 88.5 kg (195 lb 1.6 oz)  - BMI 29.66 kg/m??     Physical Exam  Constitutional:       General: He is not in acute distress.     Appearance: Normal appearance. He is well-developed and normal weight. He is not ill-appearing, toxic-appearing or diaphoretic.   HENT:      Head: Normocephalic and atraumatic.      Right Ear: External ear normal.      Left Ear: External ear normal.      Nose: Nose normal.      Mouth/Throat:      Pharynx: No oropharyngeal exudate.   Eyes:      General: No scleral icterus.        Right eye: No discharge.         Left eye: No discharge.      Conjunctiva/sclera: Conjunctivae normal.      Pupils: Pupils are equal, round, and reactive to light.   Neck:      Thyroid: No thyromegaly.      Vascular: No JVD.      Trachea: No tracheal deviation.   Cardiovascular:      Rate and Rhythm: Normal rate and regular rhythm.      Heart sounds: Normal heart sounds. No murmur heard.     No friction rub. No gallop.   Pulmonary:      Effort: Pulmonary effort is normal. No respiratory distress.      Breath sounds: Normal breath sounds. No stridor. No wheezing, rhonchi or rales.   Chest:      Chest wall: No tenderness.   Abdominal:      General: Bowel sounds are normal. There is no distension. Palpations: Abdomen is soft. There is no mass.      Tenderness: There is no abdominal tenderness. There is no right CVA tenderness, left CVA tenderness, guarding or rebound.      Hernia: No hernia is present.   Genitourinary:     Rectum: Guaiac result negative. Tenderness, external hemorrhoid and internal hemorrhoid present. No mass. Normal anal tone.          Comments: 2 external hemorrhoids  Non thrombosed  Non inflamed  Non tender  No bleeding  Musculoskeletal:         General: No tenderness or deformity. Normal range of motion.      Cervical back: Normal range of motion and neck supple. No rigidity or tenderness.   Lymphadenopathy:      Cervical: No cervical adenopathy.   Skin:     General: Skin is warm and dry.      Coloration: Skin is not pale.      Findings: No erythema or rash.   Neurological:  General: No focal deficit present.      Mental Status: He is alert and oriented to person, place, and time.      Cranial Nerves: No cranial nerve deficit.      Motor: Weakness present.      Coordination: Coordination normal.      Gait: Gait normal.      Deep Tendon Reflexes: Reflexes are normal and symmetric.   Psychiatric:         Mood and Affect: Mood normal.         Behavior: Behavior normal.         Thought Content: Thought content normal.         Judgment: Judgment normal.        Data Review:    Lab Results   Component Value Date    WBC 13.7 (H) 03/29/2023    HGB 11.9 (L) 03/29/2023    HCT 37.4 (L) 03/29/2023    PLT 328 03/29/2023       Lab Results   Component Value Date    NA 139 03/29/2023    K 5.0 (H) 03/29/2023    CL 104 03/29/2023    CO2 23.8 03/29/2023    BUN 24 (H) 03/29/2023    CREATININE 1.21 (H) 03/29/2023    GLU 182 (H) 03/29/2023    CALCIUM 9.3 03/29/2023    MG 1.7 03/29/2023    PHOS 3.9 03/29/2023       Lab Results   Component Value Date    BILITOT 0.3 03/29/2023    BILIDIR 0.10 03/29/2023    PROT 7.9 03/29/2023    ALBUMIN 3.6 03/29/2023    ALT 12 03/29/2023    AST 12 03/29/2023    ALKPHOS 101 03/29/2023    GGT 32 03/29/2023       Lab Results   Component Value Date    PT 13.0 (H) 10/31/2022    INR 1.17 10/31/2022    APTT 29.6 10/12/2018     Imaging:    IMPRESSION: CTA Abdomen Pelvis W Wo Contrast : 09/21/2022  1. Evidence of ongoing widespread Colitis since the CT last month,   most pronounced in the sigmoid colon.   New small volume of simple density free fluid in the pelvis, likely   reactive. No evidence of bowel perforation or obstruction.   Negative for active GI bleeding by CTA.   2. No other acute or inflammatory process identified.     IMPRESSION: CT Abdomen Pelvis W Contrast 08/16/2022  1. Moderate severity descending and sigmoid colitis.   2. Evidence of prior cholecystectomy and prior appendectomy.   3. Mild to moderate severity urinary bladder wall thickening, which   may be secondary to chronic bladder outlet obstruction. Further   evaluation with urinalysis is recommended, as sequelae associated   with acute cystitis cannot be excluded.

## 2023-05-04 NOTE — Unmapped (Signed)
 Improved with medical treatment  [name] hemorrhoid compound as needed  Supplement diet with Water (64 oz) & Fiber (30g) daily  Avoid constipation & Straining  Consider banding of inner component of hemorrhoid at the time of colonoscopy  Follow up in 6 months

## 2023-05-06 NOTE — Unmapped (Signed)
 Left message on 05/06/23 for Anthony Alvarado regarding upcoming GI procedure on 05/10/23 at [facility] location at 1000 with arrival time of 0900. Detailed start date of low fiber diet and clear liquid diet/prep, medications to hold or adjust (if applicable) as well as needing to have driver who MUST SIGN DISCHARGE PAPERS and take them home. Reminded patient that prep instructions are in Mychart message dated 05/03/23 for reference. Reinforced that failure to follow the diet and prep instructions could result in procedure getting rescheduled. Left number [phone] for return call if there are any questions after reviewing instructions.

## 2023-05-10 ENCOUNTER — Encounter: Admit: 2023-05-10 | Discharge: 2023-05-10 | Payer: MEDICARE

## 2023-05-10 ENCOUNTER — Inpatient Hospital Stay: Admit: 2023-05-10 | Discharge: 2023-05-10 | Payer: MEDICARE

## 2023-05-10 DIAGNOSIS — K7460 Unspecified cirrhosis of liver: Secondary | ICD-10-CM | POA: Diagnosis not present

## 2023-05-10 DIAGNOSIS — K760 Fatty (change of) liver, not elsewhere classified: Secondary | ICD-10-CM | POA: Diagnosis not present

## 2023-05-10 DIAGNOSIS — K6389 Other specified diseases of intestine: Secondary | ICD-10-CM | POA: Diagnosis not present

## 2023-05-10 DIAGNOSIS — K921 Melena: Secondary | ICD-10-CM | POA: Diagnosis not present

## 2023-05-10 DIAGNOSIS — J4520 Mild intermittent asthma, uncomplicated: Secondary | ICD-10-CM | POA: Diagnosis not present

## 2023-05-10 DIAGNOSIS — Z79890 Hormone replacement therapy (postmenopausal): Secondary | ICD-10-CM | POA: Diagnosis not present

## 2023-05-10 DIAGNOSIS — G4733 Obstructive sleep apnea (adult) (pediatric): Secondary | ICD-10-CM | POA: Diagnosis not present

## 2023-05-10 DIAGNOSIS — I10 Essential (primary) hypertension: Secondary | ICD-10-CM | POA: Diagnosis not present

## 2023-05-10 DIAGNOSIS — K5140 Inflammatory polyps of colon without complications: Secondary | ICD-10-CM | POA: Diagnosis not present

## 2023-05-10 DIAGNOSIS — K529 Noninfective gastroenteritis and colitis, unspecified: Secondary | ICD-10-CM | POA: Diagnosis not present

## 2023-05-10 DIAGNOSIS — Z79899 Other long term (current) drug therapy: Secondary | ICD-10-CM | POA: Diagnosis not present

## 2023-05-10 DIAGNOSIS — Z951 Presence of aortocoronary bypass graft: Secondary | ICD-10-CM | POA: Diagnosis not present

## 2023-05-10 DIAGNOSIS — I2510 Atherosclerotic heart disease of native coronary artery without angina pectoris: Secondary | ICD-10-CM | POA: Diagnosis not present

## 2023-05-10 DIAGNOSIS — E119 Type 2 diabetes mellitus without complications: Secondary | ICD-10-CM | POA: Diagnosis not present

## 2023-05-10 DIAGNOSIS — Z7982 Long term (current) use of aspirin: Secondary | ICD-10-CM | POA: Diagnosis not present

## 2023-05-10 DIAGNOSIS — K633 Ulcer of intestine: Secondary | ICD-10-CM | POA: Diagnosis not present

## 2023-05-10 DIAGNOSIS — E039 Hypothyroidism, unspecified: Secondary | ICD-10-CM | POA: Diagnosis not present

## 2023-05-10 DIAGNOSIS — Z7984 Long term (current) use of oral hypoglycemic drugs: Secondary | ICD-10-CM | POA: Diagnosis not present

## 2023-05-10 MED ADMIN — propofol (DIPRIVAN) infusion 10 mg/mL: INTRAVENOUS | @ 14:00:00 | Stop: 2023-05-10

## 2023-05-10 MED ADMIN — lidocaine (PF) (XYLOCAINE-MPF) 20 mg/mL (2 %) injection: INTRAVENOUS | @ 14:00:00 | Stop: 2023-05-10

## 2023-05-10 MED ADMIN — Propofol (DIPRIVAN) injection: INTRAVENOUS | @ 14:00:00 | Stop: 2023-05-10

## 2023-05-10 MED ADMIN — sodium chloride (NS) 0.9 % infusion: 10 mL/h | INTRAVENOUS | @ 14:00:00 | Stop: 2023-05-10

## 2023-05-11 NOTE — Unmapped (Addendum)
 [facility]  MULTIDISCIPLIN[facility]FLAMMATORY BOWEL DISEASES CENTER  Date:  05/11/2023  Patient: Anthony Alvarado    Interval   Anthony Alvarado presents today for evaluation of recent colonoscopy findings.    Currently, he has overall been feeling well.  He passes 3-5 stools per day.  Rarely he will experience up to 7 stools per day.  There is not any blood in his stool currently.  He experiencing some abdominal fullness.  His weight has been slowly rising.    He is currently on tacrolimus 3 mg twice daily for posttransplant immunosuppression.    He has also been experiencing symmetrical arthralgias in hands, knees and ankles.     Anda Kraft Index for Crohn's Disease  General Well Being: 0 = Very well  Abdominal Pain:  1 = mild  Number of Liquid or Soft Stools Per day: 3  Abdominal Mass:  1 = Questionable  Number of Extraintestinal Manifestations:  0   1 point each for: 1. Arthralgia, 2. Uveitis, 3. Erythema nodosum, 4. Aphthous ulcers, 5. Pyoderma gangrenosum, 6. Anal fissure, 7. Active perianal fistula, 8. Abscess  Total HBI Score (0-25):  5     Score:  Remission < 5;  Mild disease 5-7;  Moderate disease 8-16;  Severe disease > 16    Inflammatory Bowel Disease History     Year of disease onset:  2024  Diagnosis:  Crohn's Disease  Age at onset:  > 5 years old (A3)  Location:  Colonic (L2)  Behavior:  Nonstricturing, nonpenetrating (B1)  Perianal Disease:  No    Disease Course:    09/2012- Normal colonoscopy  10/2017- CT with normal bowels  12/2020- Normal bowels on CT A/P  07/2022- Admitted to OSH with diarrhea and rectal bleeding, CTA with hyperenhancement of thick-walled sigmoid colon. Transferred to [facility]  08/2022- Colonoscopy with prolapsed hemorrhoids, severe mucosal changes consistent with congestion, erythema, friability, deep ulcerations. Normal TI. Given oral steroids.  No response. Admitted to hospital and given IV steroids and IFX with improvement  Path: Severely active colitis with ulceration  09/2022- Flexible sigmoidoscopy with external hemorrhoids. Solid stool in sigmoid colon.  Distal colon normal  04/2023- Colonoscopy with normal TI.  Moderately eroded, nodular, pseudopolypoid, scarred mucosa in the sigmoid colon, transverse colon, hepatic flexure, ascending colon and cecum.  Scattered pseudopolyps. SES-CD 14  Pathology pending    Past History   -Cryptogenic cirrhosis s/p OLT  -Diabetes mellitus, type II  -Hypertension  OSA    PSHx  -Liver transplant  -TAVR for aortic stenosis  -Pulmonary decortication  -Appendectomy      Family History   Problem Relation Age of Onset    Asthma Mother     Hearing loss Mother     Liver disease Father     COPD Father     Cancer Father     Autoimmune disease Sister         AIH    Heart disease Sister     Liver disease Sister     Thyroid disease Sister     Thyroid disease Sister     Colon cancer Maternal Grandfather     Melanoma Neg Hx     Basal cell carcinoma Neg Hx     Squamous cell carcinoma Neg Hx      Social History     Socioeconomic History    Marital status: Legally Separated     Spouse name: tammy    Number of children: 1   Tobacco  Use    Smoking status: Never    Smokeless tobacco: Never   Vaping Use    Vaping status: Never Used   Substance and Sexual Activity    Alcohol use: Not Currently    Drug use: Never    Sexual activity: Yes     Partners: Female   Other Topics Concern    Do you use sunscreen? Yes    Tanning bed use? No    Are you easily burned? Yes    Excessive sun exposure? No    Blistering sunburns? Yes     Social Drivers of Health     Financial Resource Strain: Medium Risk (01/04/2023)    Received from [facility]    Overall Financial Resource Strain (CARDIA)     Difficulty of Paying Living Expenses: Somewhat hard   Food Insecurity: No Food Insecurity (01/04/2023)    Received from [facility]    Hunger Vital Sign     Worried About Running Out of Food in the Last Year: Never true     Ran Out of Food in the Last Year: Never true   Transportation Needs: No Transportation Needs (01/04/2023)    Received from [facility] - Transportation     Lack of Transportation (Medical): No     Lack of Transportation (Non-Medical): No   Physical Activity: Insufficiently Active (01/04/2023)    Received from [facility]    Exercise Vital Sign     Days of Exercise per Week: 1 day     Minutes of Exercise per Session: 10 min   Stress: No Stress Concern Present (01/04/2023)    Received from [facility] - Occupational Stress Questionnaire     Feeling of Stress : Only a little   Social Connections: Moderately Integrated (01/04/2023)    Received from [facility]    Social Connection and Isolation Panel [NHANES]     Frequency of Communication with Friends and Family: More than three times a week     Frequency of Social Gatherings with Friends and Family: Twice a week     Attends Religious Services: More than 4 times per year     Active Member of [employer] or Organizations: Yes     Attends Banker Meetings: 1 to 4 times per year     Marital Status: Separated   Housing: Low Risk  (09/30/2022)    Housing     Within the past 12 months, have you ever stayed: outside, in a car, in a tent, in an overnight shelter, or temporarily in someone else's home (i.e. couch-surfing)?: No     Are you worried about losing your housing?: No       Allergies     Allergies   Allergen Reactions    Mycophenolate Mofetil Diarrhea     Mycophenolate colitis     Watermelon Flavor      Mouth itching     Medications   has a current medication list which includes the following prescription(s): aspirin, azelastine, onetouch ultra test, freestyle libre 3 sensor, calcium carbonate, empagliflozin, fluticasone propionate, glucose, glycerin/witch hazel leaf, hydrocortisone, levothyroxine, tradjenta, metoprolol tartrate, pen needle, diabetic, pravastatin, sertraline, tacrolimus, cholecalciferol (vitamin d3), and [DISCONTINUED] alendronate.    Physical Exam   BP 144/87  - Pulse 64  - Temp 36.2 ??C (97.1 ??F) (Temporal)  - Ht 172.7 cm (5' 8)  - Wt 87.9 kg (193 lb 12.8 oz)  - BMI  29.47 kg/m??     Gen: Well-appearing, no acute distress  Eyes: no scleral icterus.  HENT:  No temporal wasting  GI: Nontender, non-distended.    Labs and imaging reviewed.    Assessment & Recommendations     Anthony Alvarado is a 63 y.o. man with history of cryptogenic cirrhosis status post liver transplant and previous suspicion for mycophenolate induced colitis, with now recent diagnosis of colonic Crohn's disease, who presents to establish care. Moderate-to-severe Crohn's disease is a chronic illness that presents a threat to bodily function in the near term without treatment or despite treatment.    He is mildly to moderately symptomatic today from his Crohn's disease.    He was previously diagnosed with acute colitis that required inpatient hospitalization, treatment with IV corticosteroids and inpatient infliximab.  However, given endoscopic appearance of his colitis as well as pathology (absence of chronicity),, he was diagnosed with drug-induced colitis to mycophenolate rather than colonic Crohn's disease.  Since he has now had recurrence of disease in the absence of mycophenolate use along with an endoscopic appearance consistent with Crohn's disease, I feel that this is consistent with colonic Crohn's disease.  Of note, pathology from his recent colonoscopy remains pending.    For management, we will proceed with risankizumab.  This medication was chosen to balance efficacy as well as targeted immune suppression in combination with his tacrolimus.  We discussed the risks of this medication, including risks of infection.    Second, he is at risk for vitamin and mineral deficiencies.  Testing in the fall demonstrated a normal vitamin D and vitamin B12, but iron was mildly low.  We will recheck this today.    Last, he has experiencing chronic symmetrical arthralgias. This likely represents enteropathic arthritis.    PLAN:  Labs today  Start risankizumab    Return to clinic in 3 months.    Marylou Mccoy, MD MS  Multidisciplinary Inflammatory Bowel Diseases Center  [facility]

## 2023-05-12 ENCOUNTER — Other Ambulatory Visit: Payer: Self-pay | Admitting: Family Medicine

## 2023-05-12 MED ORDER — PRAVASTATIN 40 MG TABLET
ORAL_TABLET | Freq: Every evening | ORAL | 2 refills | 30.00 days
Start: 2023-05-12 — End: ?

## 2023-05-13 ENCOUNTER — Ambulatory Visit: Admit: 2023-05-13 | Discharge: 2023-05-13 | Payer: MEDICARE | Attending: Gastroenterology | Primary: Gastroenterology

## 2023-05-13 ENCOUNTER — Ambulatory Visit: Admit: 2023-05-13 | Discharge: 2023-05-13 | Payer: MEDICARE

## 2023-05-13 DIAGNOSIS — E611 Iron deficiency: Principal | ICD-10-CM

## 2023-05-13 DIAGNOSIS — K50111 Crohn's disease of large intestine with rectal bleeding: Principal | ICD-10-CM

## 2023-05-13 DIAGNOSIS — Z1159 Encounter for screening for other viral diseases: Principal | ICD-10-CM

## 2023-05-13 LAB — IRON PANEL
IRON SATURATION: 4 % — ABNORMAL LOW (ref 20–55)
IRON: 14 ug/dL — ABNORMAL LOW (ref 65–175)
TOTAL IRON BINDING CAPACITY: 317 ug/dL (ref 250–425)

## 2023-05-13 LAB — C-REACTIVE PROTEIN: C-REACTIVE PROTEIN: 27.2 mg/L — ABNORMAL HIGH (ref <=10.0)

## 2023-05-13 MED ORDER — PRAVASTATIN 40 MG TABLET
ORAL_TABLET | Freq: Every evening | ORAL | 2 refills | 30.00 days
Start: 2023-05-13 — End: ?

## 2023-05-13 NOTE — Unmapped (Addendum)
 MEDICATION ORDER ENTRY DOCUMENTATION    The following medication(s) have been sent to pharmacy for insurance authorization:    Medication: risankizumab   Dose & Frequency: 600mg  IV infusion at week 0,4,8, then OBI 360mg  at week 12, then q 8 week OBI for maintenance  IV infusions: [facility]   Pharmacy OBI planned: [facility]     TB Check: Yes [pending today   Hepatitis B Check: Yes [pending today   CBC, CMP completed on 03/29/23    Requesting Physician: Dr. Norleen Aspen    Notes:  Labs before 3rd infusion added on [facility] order- CBC, CMP  BCBS Medicare- so no complete pro    Ambulatory Referral to Clinical Pharmacist Placed:    Update: 05/13/23  Skyrizi  induction infusions have been authorized/scheduled  Appts  at [facility]: 05/23, 06/20, 07/18  Skyrizi  dosing details, website/provided to pt- SENT MYCHART MESSAGE  Patient enrollment in [employer] Complete Pro completed- yes  Previous therapy N/A discontinued  Referral to CPP will be sent after first infusion is complete  Rx for OBI will be sent after 2nd infusion is complete.  Reason for Call- document new skyrizi  start    -OBI rx sent into [facility] on 08/18/23    Please schedule in 8-12 weeks from [place] start date]. Patient can schedule on or around the day injection is due if they prefer to bring to clinic and administer.

## 2023-05-13 NOTE — Unmapped (Signed)
 It was a pleasure seeing you today.  Here is a summary/wrap up from today's visit:     Bloodwork  We will start Cristy Folks  If any trouble or symptoms, do not hesitate to call.     Marylou Mccoy, MD MS  Multidisciplinary Inflammatory Bowel Diseases Center  [facility]

## 2023-05-13 NOTE — Unmapped (Signed)
 Provided self injection training with patient with SKYRIZI OBI. Pt verbalized understanding. Provided QR code for [employer] pt resources and nurse teaching support. Discussed dosing and monitoring labs, ect.

## 2023-05-14 LAB — HEPATITIS B SURFACE ANTIBODY
HEPATITIS B SURFACE ANTIBODY QUANT: <8 m[IU]/mL (ref <8.00)
HEPATITIS B SURFACE ANTIBODY: NONREACTIVE

## 2023-05-14 LAB — HEPATITIS B CORE ANTIBODY, TOTAL: HEPATITIS B CORE TOTAL ANTIBODY: NONREACTIVE

## 2023-05-14 LAB — HEPATITIS B SURFACE ANTIGEN: HEPATITIS B SURFACE ANTIGEN: NONREACTIVE

## 2023-05-17 LAB — QUANTIFERON TB GOLD PLUS
QUANTIFERON ANTIGEN 1 MINUS NIL: 0 [IU]/mL
QUANTIFERON ANTIGEN 2 MINUS NIL: 0 [IU]/mL
QUANTIFERON MITOGEN: 9.96 [IU]/mL
QUANTIFERON TB GOLD PLUS: NEGATIVE
QUANTIFERON TB NIL VALUE: 0.04 [IU]/mL

## 2023-05-17 LAB — TB AG1: TB AG1 VALUE: 0.04

## 2023-05-17 LAB — TB MITOGEN: TB MITOGEN VALUE: 10

## 2023-05-17 LAB — TB NIL: TB NIL VALUE: 0.04

## 2023-05-17 LAB — TB AG2: TB AG2 VALUE: 0.04

## 2023-05-20 NOTE — Unmapped (Signed)
 [facility] Refill Coordination Note    Specialty Medication(s) to be Shipped:   Transplant: tacrolimus 1mg     Other medication(s) to be shipped:  [name], levothyroxine, metoprolol, sertraline, pravastatin     Anthony Alvarado, Anthony Alvarado: 28-Jan-1961  Phone: [phone] (home)       All above HIPAA information was verified with patient.     Was a Nurse, learning disability used for this call? No    Completed refill call assessment today to schedule patient's medication shipment from the [facility]  [phone]).  All relevant notes have been reviewed.     Specialty medication(s) and dose(s) confirmed: Regimen is correct and unchanged.   Changes to medications: Anthony Alvarado reports no changes at this time.  Changes to insurance: No  New side effects reported not previously addressed with a pharmacist or physician: None reported  Questions for the pharmacist: No    Confirmed patient received a Conservation officer, historic buildings and a Surveyor, mining with first shipment. The patient will receive a drug information handout for each medication shipped and additional FDA Medication Guides as required.       DISEASE/MEDICATION-SPECIFIC INFORMATION        N/A    SPECIALTY MEDICATION ADHERENCE     Medication Adherence    Patient reported X missed doses in the last month: 0  Specialty Medication: tacrolimus 1 MG capsule (PROGRAF)  Patient is on additional specialty medications: No  Patient is on more than two specialty medications: No  Any gaps in refill history greater than 2 weeks in the last 3 months: no  Demonstrates understanding of importance of adherence: yes  Informant: patient  Confirmed plan for next specialty medication refill: delivery by pharmacy  Refills needed for supportive medications: not needed          Refill Coordination    Has the Patients' Contact Information Changed: No  Is the Shipping Address Different: No         Were doses missed due to medication being on hold? No    tacrolimus 1  mg: 7 days of medicine on hand       REFERRAL TO PHARMACIST     Referral to the pharmacist: Not needed      [name]     Shipping address confirmed in Epic.     Cost and Payment: Patient has a copay of $6. They are aware and have authorized the pharmacy to charge the credit card on file.    Delivery Scheduled: Yes, Expected medication delivery date: 05/26/23.     Medication will be delivered via UPS to the prescription address in Epic WAM.    [name]   [facility]  Specialty Technician

## 2023-05-21 ENCOUNTER — Telehealth: Payer: Self-pay | Admitting: Internal Medicine

## 2023-05-21 NOTE — Telephone Encounter (Signed)
 Called and spoke with pharmacy regarding manufactory change, Mylan to Lupin. Pharmacist was told that this change is ok as long as the dosage stays the same. Dosage was verified with pharmacist (175 mg once daily) and is correct.

## 2023-05-21 NOTE — Telephone Encounter (Signed)
 Pt c/o medication issue:  1. Name of Medication: levothyroxine (SYNTHROID) 175 MCG tablet   2. How are you currently taking this medication (dosage and times per day)?    3. Are you having a reaction (difficulty breathing--STAT)? no  4. What is your medication issue? Calling to see if it okay to switch manufactory for the patient. From Mylan to Lupin. Please advise

## 2023-05-25 MED FILL — LEVOTHYROXINE 175 MCG TABLET: ORAL | 90 days supply | Qty: 90 | Fill #1

## 2023-05-25 MED FILL — PRAVASTATIN 40 MG TABLET: ORAL | 90 days supply | Qty: 90 | Fill #0

## 2023-05-25 MED FILL — SERTRALINE 50 MG TABLET: ORAL | 90 days supply | Qty: 90 | Fill #2

## 2023-05-25 MED FILL — TACROLIMUS 1 MG CAPSULE, IMMEDIATE-RELEASE: ORAL | 30 days supply | Qty: 180 | Fill #6

## 2023-05-25 MED FILL — METOPROLOL TARTRATE 25 MG TABLET: ORAL | 90 days supply | Qty: 180 | Fill #1

## 2023-05-31 ENCOUNTER — Inpatient Hospital Stay: Admission: RE | Admit: 2023-05-31 | Payer: Medicare Other | Source: Ambulatory Visit

## 2023-06-02 DIAGNOSIS — E612 Magnesium deficiency: Principal | ICD-10-CM

## 2023-06-02 DIAGNOSIS — C220 Liver cell carcinoma: Principal | ICD-10-CM

## 2023-06-02 DIAGNOSIS — Z5181 Encounter for therapeutic drug level monitoring: Principal | ICD-10-CM

## 2023-06-02 DIAGNOSIS — Z944 Liver transplant status: Principal | ICD-10-CM

## 2023-06-02 NOTE — Unmapped (Signed)
 Called pt to schedule annual appt. Explained previous coord no longer with team and shared info on new provider. Asked pt if he would like virtual appt, but he prefers in person so is scheduled for 7/17. He said he will go [facility] week before, denied need for appt letter, and verbalized understanding of all discussed.    Sent inbasket to primary coord asking to please enter LC order into Epic.

## 2023-06-07 DIAGNOSIS — Z5181 Encounter for therapeutic drug level monitoring: Principal | ICD-10-CM

## 2023-06-07 DIAGNOSIS — Z944 Liver transplant status: Principal | ICD-10-CM

## 2023-06-07 DIAGNOSIS — E612 Magnesium deficiency: Principal | ICD-10-CM

## 2023-06-11 DIAGNOSIS — Z5181 Encounter for therapeutic drug level monitoring: Secondary | ICD-10-CM | POA: Diagnosis not present

## 2023-06-11 DIAGNOSIS — E612 Magnesium deficiency: Secondary | ICD-10-CM | POA: Diagnosis not present

## 2023-06-12 LAB — CBC W/ DIFFERENTIAL
BANDED NEUTROPHILS ABSOLUTE COUNT: 0.1 10*3/uL (ref 0.0–0.1)
BASOPHILS ABSOLUTE COUNT: 0.1 10*3/uL (ref 0.0–0.2)
BASOPHILS RELATIVE PERCENT: 1 %
EOSINOPHILS ABSOLUTE COUNT: 0.8 10*3/uL — ABNORMAL HIGH (ref 0.0–0.4)
EOSINOPHILS RELATIVE PERCENT: 7 %
HEMATOCRIT: 35.8 % — ABNORMAL LOW (ref 37.5–51.0)
HEMOGLOBIN: 10.8 g/dL — ABNORMAL LOW (ref 13.0–17.7)
IMMATURE GRANULOCYTES: 1 %
LYMPHOCYTES ABSOLUTE COUNT: 2 10*3/uL (ref 0.7–3.1)
LYMPHOCYTES RELATIVE PERCENT: 17 %
MEAN CORPUSCULAR HEMOGLOBIN CONC: 30.2 g/dL — ABNORMAL LOW (ref 31.5–35.7)
MEAN CORPUSCULAR HEMOGLOBIN: 23.3 pg — ABNORMAL LOW (ref 26.6–33.0)
MEAN CORPUSCULAR VOLUME: 77 fL — ABNORMAL LOW (ref 79–97)
MONOCYTES ABSOLUTE COUNT: 1.2 10*3/uL — ABNORMAL HIGH (ref 0.1–0.9)
MONOCYTES RELATIVE PERCENT: 10 %
NEUTROPHILS ABSOLUTE COUNT: 7.6 10*3/uL — ABNORMAL HIGH (ref 1.4–7.0)
NEUTROPHILS RELATIVE PERCENT: 64 %
PLATELET COUNT: 370 10*3/uL (ref 150–450)
RED BLOOD CELL COUNT: 4.63 x10E6/uL (ref 4.14–5.80)
RED CELL DISTRIBUTION WIDTH: 14.1 % (ref 11.6–15.4)
WHITE BLOOD CELL COUNT: 11.9 10*3/uL — ABNORMAL HIGH (ref 3.4–10.8)

## 2023-06-12 LAB — COMPREHENSIVE METABOLIC PANEL
ALBUMIN: 4.3 g/dL (ref 3.9–4.9)
ALKALINE PHOSPHATASE: 116 IU/L (ref 44–121)
ALT (SGPT): 10 IU/L (ref 0–44)
AST (SGOT): 12 IU/L (ref 0–40)
BILIRUBIN TOTAL (MG/DL) IN SER/PLAS: <0.2 mg/dL (ref 0.0–1.2)
BLOOD UREA NITROGEN: 27 mg/dL (ref 8–27)
BUN / CREAT RATIO: 19 (ref 10–24)
CALCIUM: 8.9 mg/dL (ref 8.6–10.2)
CHLORIDE: 106 mmol/L (ref 96–106)
CO2: 18 mmol/L — ABNORMAL LOW (ref 20–29)
CREATININE: 1.4 mg/dL — ABNORMAL HIGH (ref 0.76–1.27)
EGFR: 57 mL/min/{1.73_m2} — ABNORMAL LOW
GLOBULIN, TOTAL: 3.5 g/dL (ref 1.5–4.5)
GLUCOSE: 142 mg/dL — ABNORMAL HIGH (ref 70–99)
POTASSIUM: 5.1 mmol/L (ref 3.5–5.2)
SODIUM: 140 mmol/L (ref 134–144)
TOTAL PROTEIN: 7.8 g/dL (ref 6.0–8.5)

## 2023-06-12 LAB — PHOSPHORUS: PHOSPHORUS, SERUM: 4.2 mg/dL — ABNORMAL HIGH (ref 2.8–4.1)

## 2023-06-12 LAB — BILIRUBIN, DIRECT: BILIRUBIN DIRECT: <0.08 mg/dL (ref 0.00–0.40)

## 2023-06-12 LAB — GAMMA GT: GAMMA GLUTAMYL TRANSFERASE: 31 IU/L (ref 0–65)

## 2023-06-12 LAB — MAGNESIUM: MAGNESIUM: 2 mg/dL (ref 1.6–2.3)

## 2023-06-14 DIAGNOSIS — Z944 Liver transplant status: Principal | ICD-10-CM

## 2023-06-14 DIAGNOSIS — Z5181 Encounter for therapeutic drug level monitoring: Principal | ICD-10-CM

## 2023-06-14 DIAGNOSIS — E612 Magnesium deficiency: Principal | ICD-10-CM

## 2023-06-14 LAB — TACROLIMUS LEVEL: TACROLIMUS BLOOD: 7.8 ng/mL (ref 5.0–20.0)

## 2023-06-16 DIAGNOSIS — Z944 Liver transplant status: Principal | ICD-10-CM

## 2023-06-16 MED ORDER — TACROLIMUS 1 MG CAPSULE, IMMEDIATE-RELEASE
ORAL_CAPSULE | 11 refills | 0.00 days | Status: CP
Start: 2023-06-16 — End: ?
  Filled 2023-06-28: qty 150, 30d supply, fill #0

## 2023-06-16 NOTE — Unmapped (Signed)
 Patient's 4/18 Cr-1.40 and Tac-7.8. Spoke with patient, who confirmed he has been taking his tacrolimus  as prescribed. He admits he might not have done the best with his fluid intake. Encouraged him to maintain good hydration with at least 4 bottles of water  and other caffeine-free fluids daily, especially with his crohn's/diarrhea issues. He denies noting any blood in his stool and said he is being followed closely by GI. Reached out to PharmD Chargualaf who recommended reducing his dose to 3mg /2mg  daily. Contacted patient and relayed recommendation with instruction to repeat labs weekly x 2 beginning the week after next. He verbalized understanding.

## 2023-06-17 DIAGNOSIS — Z944 Liver transplant status: Principal | ICD-10-CM

## 2023-06-17 NOTE — Unmapped (Addendum)
 [facility] Pharmacist has reviewed a new prescription for tacrolimus  that indicates a dose decrease.  Patient was counseled on this dosage change by [facility]- see epic note from 06/16/23.  Next refill call date adjusted if necessary.        Clinical Assessment Needed For: Dose Change  Medication: Tacrolimus  1mg  capsule  Last Fill Date/Day Supply: 05/25/2023 / 30 days  Prior Authorization Required  Was previous dose already scheduled to fill: No    Notes to Pharmacist: Referral submitted

## 2023-06-18 NOTE — Unmapped (Signed)
 Therapy Update Follow Up: No issues - Copay = $0 (with MPPP plan)

## 2023-06-21 DIAGNOSIS — E612 Magnesium deficiency: Principal | ICD-10-CM

## 2023-06-21 DIAGNOSIS — Z5181 Encounter for therapeutic drug level monitoring: Principal | ICD-10-CM

## 2023-06-21 DIAGNOSIS — Z944 Liver transplant status: Principal | ICD-10-CM

## 2023-06-22 NOTE — Unmapped (Signed)
 [facility] Refill Coordination Note    Specialty Medication(s) to be Shipped:   Transplant: tacrolimus 1mg     Other medication(s) to be shipped: No additional medications requested for fill at this time     Catlin Bato, DOB: 1960/08/13  Phone: [phone] (home)       All above HIPAA information was verified with patient.     Was a Nurse, learning disability used for this call? No    Completed refill call assessment today to schedule patient's medication shipment from the [facility]  [phone]).  All relevant notes have been reviewed.     Specialty medication(s) and dose(s) confirmed: Regimen is correct and unchanged.   Changes to medications: Mathias reports no changes at this time.  Changes to insurance: No  New side effects reported not previously addressed with a pharmacist or physician: None reported  Questions for the pharmacist: No    Confirmed patient received a Conservation officer, historic buildings and a Surveyor, mining with first shipment. The patient will receive a drug information handout for each medication shipped and additional FDA Medication Guides as required.       DISEASE/MEDICATION-SPECIFIC INFORMATION        N/A    SPECIALTY MEDICATION ADHERENCE     Medication Adherence    Patient reported X missed doses in the last month: 0  Specialty Medication: tacrolimus 1 MG capsule (PROGRAF)  Patient is on additional specialty medications: No  Patient is on more than two specialty medications: No  Any gaps in refill history greater than 2 weeks in the last 3 months: no  Demonstrates understanding of importance of adherence: yes  Informant: patient  Confirmed plan for next specialty medication refill: delivery by pharmacy  Refills needed for supportive medications: not needed          Refill Coordination    Has the Patients' Contact Information Changed: No  Is the Shipping Address Different: No         Were doses missed due to medication being on hold? No    tacrolimus 1  mg: 7 days of medicine on hand       REFERRAL TO PHARMACIST     Referral to the pharmacist: Not needed      [facility]     Shipping address confirmed in Epic.     Cost and Payment: Patient has a $0 copay, payment information is not required.    Delivery Scheduled: Yes, Expected medication delivery date: 06/29/23.     Medication will be delivered via UPS to the prescription address in Epic WAM.    Loretta Romp   [facility]  Specialty Technician

## 2023-06-23 DIAGNOSIS — Z944 Liver transplant status: Principal | ICD-10-CM

## 2023-06-23 DIAGNOSIS — E612 Magnesium deficiency: Principal | ICD-10-CM

## 2023-06-23 DIAGNOSIS — Z5181 Encounter for therapeutic drug level monitoring: Principal | ICD-10-CM

## 2023-06-23 DIAGNOSIS — D649 Anemia, unspecified: Principal | ICD-10-CM

## 2023-06-23 NOTE — Unmapped (Signed)
 Received msg from PA [place] to add ferritin and iron testing to the next set of patient's labs d/t low MCV and anemia. Order placed and patient notified via myChart to request that these labs be done with his next set of transplant testing.

## 2023-06-28 DIAGNOSIS — Z944 Liver transplant status: Principal | ICD-10-CM

## 2023-06-28 DIAGNOSIS — Z5181 Encounter for therapeutic drug level monitoring: Principal | ICD-10-CM

## 2023-06-28 DIAGNOSIS — E612 Magnesium deficiency: Principal | ICD-10-CM

## 2023-07-01 MED ORDER — JARDIANCE 25 MG TABLET
ORAL_TABLET | Freq: Every day | ORAL | 1 refills | 90.00000 days | Status: CP
Start: 2023-07-01 — End: ?

## 2023-07-05 DIAGNOSIS — Z944 Liver transplant status: Principal | ICD-10-CM

## 2023-07-05 DIAGNOSIS — Z5181 Encounter for therapeutic drug level monitoring: Principal | ICD-10-CM

## 2023-07-05 DIAGNOSIS — E612 Magnesium deficiency: Principal | ICD-10-CM

## 2023-07-06 DIAGNOSIS — Z944 Liver transplant status: Principal | ICD-10-CM

## 2023-07-06 LAB — COMPREHENSIVE METABOLIC PANEL
ALBUMIN: 4.3 g/dL (ref 3.9–4.9)
ALKALINE PHOSPHATASE: 107 IU/L (ref 44–121)
ALT (SGPT): 17 IU/L (ref 0–44)
AST (SGOT): 16 IU/L (ref 0–40)
BILIRUBIN TOTAL (MG/DL) IN SER/PLAS: <0.2 mg/dL (ref 0.0–1.2)
BLOOD UREA NITROGEN: 24 mg/dL (ref 8–27)
BUN / CREAT RATIO: 17 (ref 10–24)
CALCIUM: 9.4 mg/dL (ref 8.6–10.2)
CHLORIDE: 104 mmol/L (ref 96–106)
CO2: 17 mmol/L — ABNORMAL LOW (ref 20–29)
CREATININE: 1.38 mg/dL — ABNORMAL HIGH (ref 0.76–1.27)
EGFR: 58 mL/min/{1.73_m2} — ABNORMAL LOW
GLOBULIN, TOTAL: 3.6 g/dL (ref 1.5–4.5)
GLUCOSE: 133 mg/dL — ABNORMAL HIGH (ref 70–99)
POTASSIUM: 5.2 mmol/L (ref 3.5–5.2)
SODIUM: 139 mmol/L (ref 134–144)
TOTAL PROTEIN: 7.9 g/dL (ref 6.0–8.5)

## 2023-07-06 LAB — MAGNESIUM: MAGNESIUM: 2 mg/dL (ref 1.6–2.3)

## 2023-07-06 LAB — GAMMA GT: GAMMA GLUTAMYL TRANSFERASE: 30 IU/L (ref 0–65)

## 2023-07-06 LAB — CBC W/ DIFFERENTIAL
BANDED NEUTROPHILS ABSOLUTE COUNT: 0 10*3/uL (ref 0.0–0.1)
BASOPHILS ABSOLUTE COUNT: 0.1 10*3/uL (ref 0.0–0.2)
BASOPHILS RELATIVE PERCENT: 1 %
EOSINOPHILS ABSOLUTE COUNT: 0.7 10*3/uL — ABNORMAL HIGH (ref 0.0–0.4)
EOSINOPHILS RELATIVE PERCENT: 6 %
HEMATOCRIT: 34.7 % — ABNORMAL LOW (ref 37.5–51.0)
HEMOGLOBIN: 10 g/dL — ABNORMAL LOW (ref 13.0–17.7)
IMMATURE GRANULOCYTES: 0 %
LYMPHOCYTES ABSOLUTE COUNT: 2 10*3/uL (ref 0.7–3.1)
LYMPHOCYTES RELATIVE PERCENT: 16 %
MEAN CORPUSCULAR HEMOGLOBIN CONC: 28.8 g/dL — ABNORMAL LOW (ref 31.5–35.7)
MEAN CORPUSCULAR HEMOGLOBIN: 22 pg — ABNORMAL LOW (ref 26.6–33.0)
MEAN CORPUSCULAR VOLUME: 76 fL — ABNORMAL LOW (ref 79–97)
MONOCYTES ABSOLUTE COUNT: 1.2 10*3/uL — ABNORMAL HIGH (ref 0.1–0.9)
MONOCYTES RELATIVE PERCENT: 10 %
NEUTROPHILS ABSOLUTE COUNT: 8.2 10*3/uL — ABNORMAL HIGH (ref 1.4–7.0)
NEUTROPHILS RELATIVE PERCENT: 67 %
PLATELET COUNT: 447 10*3/uL (ref 150–450)
RED BLOOD CELL COUNT: 4.54 x10E6/uL (ref 4.14–5.80)
RED CELL DISTRIBUTION WIDTH: 14.5 % (ref 11.6–15.4)
WHITE BLOOD CELL COUNT: 12.3 10*3/uL — ABNORMAL HIGH (ref 3.4–10.8)

## 2023-07-06 LAB — PHOSPHORUS: PHOSPHORUS, SERUM: 3.3 mg/dL (ref 2.8–4.1)

## 2023-07-06 LAB — BILIRUBIN, DIRECT: BILIRUBIN DIRECT: 0.08 mg/dL (ref 0.00–0.40)

## 2023-07-07 LAB — TACROLIMUS LEVEL: TACROLIMUS BLOOD: 5.7 ng/mL (ref 5.0–20.0)

## 2023-07-08 ENCOUNTER — Encounter: Payer: Self-pay | Admitting: Pharmacist

## 2023-07-08 NOTE — Progress Notes (Signed)
 Pharmacy Quality Measure Review  This patient is appearing on a report for being at risk of failing the adherence measure for cholesterol (statin) medications this calendar year.   Medication: PRAVASTATIN  40MG  Last fill date: 04/14/23 for 30 day supply  Insurance report was not up to date. No action needed at this time.  Medication has been refilled as of 05/25/23 for 90 day supply

## 2023-07-12 DIAGNOSIS — Z944 Liver transplant status: Principal | ICD-10-CM

## 2023-07-12 DIAGNOSIS — E612 Magnesium deficiency: Principal | ICD-10-CM

## 2023-07-12 DIAGNOSIS — Z5181 Encounter for therapeutic drug level monitoring: Principal | ICD-10-CM

## 2023-07-15 DIAGNOSIS — G4733 Obstructive sleep apnea (adult) (pediatric): Secondary | ICD-10-CM | POA: Diagnosis not present

## 2023-07-16 DIAGNOSIS — K50111 Crohn's disease of large intestine with rectal bleeding: Secondary | ICD-10-CM | POA: Diagnosis not present

## 2023-07-19 DIAGNOSIS — E612 Magnesium deficiency: Principal | ICD-10-CM

## 2023-07-19 DIAGNOSIS — Z944 Liver transplant status: Principal | ICD-10-CM

## 2023-07-19 DIAGNOSIS — Z5181 Encounter for therapeutic drug level monitoring: Principal | ICD-10-CM

## 2023-07-22 DIAGNOSIS — Z944 Liver transplant status: Principal | ICD-10-CM

## 2023-07-22 NOTE — Unmapped (Signed)
 Patient left VM for TNC, stating that he is having a crohns flare and requested a medication to ease it. Contacted patient, who reports he has felt a crohn's flare starting over the last month, worsening over the last 10 days, citing increased nonbloody diarrhea 7-8x daily with weakness and anorexia. He stated he is trying to increase hydration and is drinking Liquid IV to replenish electrolytes. Patiet reports he received a skyrizi linfusion last Friday. He reports he left VM with the GI provider. He has f/up with Dr.Haydek in the next two weeks. Strongly reinforced that he send a msg to his GI provider now via MyChart, call his office tomorrow and if he has not heard from someone by 2pm, he should page GI. He verbalized understanding and agreed to repeat labs in the next week to assure his electrolytes were stable. Sent staff nmsg to Dr.Haydek to notify him of patient's concerns.

## 2023-07-23 DIAGNOSIS — K50111 Crohn's disease of large intestine with rectal bleeding: Principal | ICD-10-CM

## 2023-07-23 MED ORDER — PREDNISONE 5 MG TABLET
ORAL_TABLET | ORAL | 0 refills | 63.00000 days | Status: CP
Start: 2023-07-23 — End: 2023-09-24

## 2023-07-23 NOTE — Unmapped (Signed)
 Telephone Note    I spoke to [name] on the phone today.    Due to insurance approval delays, he did not start risankizumab until last week.  He reached out to a colleague recently about worsening diarrhea. He has been passing 8 urgent, non-bloody stools per day.    I suspect these symptoms are due to active Crohn's disease, but infection is also on the differential. I recommended that he get stool testing done at [facility]. We will also start prednisone. In the past he has experienced hyperglycemia from prednisone--I also recommended that he alert his endocrinologist about his need for steroids.    This does not represent failure to risankizumab because he has only been on the medication for a limited about of time.    Anthony Dears, MD MS  Multidisciplinary Inflammatory Bowel Diseases Center      The patient reports they are physically located in [place]  and is currently: at home. I conducted a phone visit.  I spent 7 minutes on the phone call with the patient on the date of service .

## 2023-07-26 DIAGNOSIS — Z5181 Encounter for therapeutic drug level monitoring: Principal | ICD-10-CM

## 2023-07-26 DIAGNOSIS — Z944 Liver transplant status: Principal | ICD-10-CM

## 2023-07-26 DIAGNOSIS — E612 Magnesium deficiency: Principal | ICD-10-CM

## 2023-07-28 DIAGNOSIS — K50111 Crohn's disease of large intestine with rectal bleeding: Secondary | ICD-10-CM | POA: Diagnosis not present

## 2023-08-02 DIAGNOSIS — E612 Magnesium deficiency: Principal | ICD-10-CM

## 2023-08-02 DIAGNOSIS — Z5181 Encounter for therapeutic drug level monitoring: Principal | ICD-10-CM

## 2023-08-02 DIAGNOSIS — Z944 Liver transplant status: Principal | ICD-10-CM

## 2023-08-02 NOTE — Unmapped (Signed)
 [facility] Refill Coordination Note    Specialty Medication(s) to be Shipped:   Transplant: tacrolimus 1mg     Other medication(s) to be shipped: pen needles and [facility] 3 sensors     Anthony Alvarado, DOB: 08/08/1960  Phone: [phone] (home)       All above HIPAA information was verified with patient.     Was a Nurse, learning disability used for this call? No    Completed refill call assessment today to schedule patient's medication shipment from the [facility] and [facility] Delivery Pharmacy  [phone]).  All relevant notes have been reviewed.     Specialty medication(s) and dose(s) confirmed: Regimen is correct and unchanged.   Changes to medications: [place] reports no changes at this time.  Changes to insurance: No  New side effects reported not previously addressed with a pharmacist or physician: None reported  Questions for the pharmacist: No    Confirmed patient received a Conservation officer, historic buildings and a Surveyor, mining with first shipment. The patient will receive a drug information handout for each medication shipped and additional FDA Medication Guides as required.       DISEASE/MEDICATION-SPECIFIC INFORMATION        N/A    SPECIALTY MEDICATION ADHERENCE     Medication Adherence    Patient reported X missed doses in the last month: 0  Specialty Medication: tacrolimus 1 MG capsule (PROGRAF)  Patient is on additional specialty medications: No  Patient is on more than two specialty medications: No  Any gaps in refill history greater than 2 weeks in the last 3 months: no  Demonstrates understanding of importance of adherence: yes  Informant: patient  Confirmed plan for next specialty medication refill: delivery by pharmacy  Refills needed for supportive medications: not needed          Refill Coordination    Has the Patients' Contact Information Changed: No  Is the Shipping Address Different: No         Were doses missed due to medication being on hold? No    tacrolimus 1 mg: 7 days of medicine on hand       REFERRAL TO PHARMACIST     Referral to the pharmacist: Not needed      [facility]     Shipping address confirmed in Epic.     Cost and Payment: Patient has a $0 copay, payment information is not required.    Delivery Scheduled: Yes, Expected medication delivery date: 08/05/23.     Medication will be delivered via UPS to the prescription address in Epic WAM.    Loretta Romp   [facility]  Specialty Technician

## 2023-08-03 NOTE — Unmapped (Unsigned)
 [facility]  Date:  08/03/2023  Patient: Anthony Alvarado    Interval     Anthony Alvarado returns today for ongoing care.    He is feeling better than when he first developed Norovirus, although has not fully returned to normal yet.  He is passing 6 stools today, which typically occur in the early morning and in the evening.  There is no blood in the stool.  There is some abdominal soreness.    He is still undergoing risankizumab infusions.  He has remained on tacrolimus milligrams twice daily for posttransplant immunosuppression.    He had previously been experiencing symmetrical arthralgias, but these have since resolved.     Anthony Alvarado Index for Crohn's Disease  General Well Being: 0 = Very well  Abdominal Pain:  1 = mild  Number of Liquid or Soft Stools Per day: 3  Abdominal Mass:  2 = Definite  Number of Extraintestinal Manifestations:  0   1 point each for: 1. Arthralgia, 2. Uveitis, 3. Erythema nodosum, 4. Aphthous ulcers, 5. Pyoderma gangrenosum, 6. Anal fissure, 7. Active perianal fistula, 8. Abscess  Total HBI Score (0-25):  5     Score:  Remission < 5;  Mild disease 5-7;  Moderate disease 8-16;  Severe disease > 16    Inflammatory Bowel Disease History     Year of disease onset:  2024  Diagnosis:  Crohn's Disease  Age at onset:  > 65 years old (A3)  Location:  Colonic (L2)  Behavior:  Nonstricturing, nonpenetrating (B1)  Perianal Disease:  No    Disease Course:    09/2012- Normal colonoscopy  10/2017- CT with normal bowels  12/2020- Normal bowels on CT A/P  07/2022- Admitted to OSH with diarrhea and rectal bleeding, CTA with hyperenhancement of thick-walled sigmoid colon. Transferred to [facility]  08/2022- Colonoscopy with prolapsed hemorrhoids, severe mucosal changes consistent with congestion, erythema, friability, deep ulcerations. Normal TI. Given oral steroids.  No response. Admitted to hospital and given IV steroids and IFX with improvement  Path: Severely active colitis with ulceration  09/2022- Flexible sigmoidoscopy with external hemorrhoids. Solid stool in sigmoid colon.  Distal colon normal  04/2023- Colonoscopy with normal TI.  Moderately eroded, nodular, pseudopolypoid, scarred mucosa in the sigmoid colon, transverse colon, hepatic flexure, ascending colon and cecum.  Scattered pseudopolyps. SES-CD 14  Path: Mildly active chronic colitis, indefinite for low-grade dysplasia (descending colon)  06/2023- Norovirus infection    Past History   -Cryptogenic cirrhosis s/p OLT  -Diabetes mellitus, type II  -Hypertension  -OSA    PSHx  -Liver transplant  -TAVR for aortic stenosis  -Pulmonary decortication  -Appendectomy    FHx  -No IBD or colon cancer    SHx  -No alcohol  -No tobacco  -Former Curator     Allergies     Allergies   Allergen Reactions    Mycophenolate Mofetil Diarrhea     Mycophenolate colitis     Watermelon Flavor      Mouth itching     Medications   has a current medication list which includes the following prescription(s): skyrizi, aspirin, azelastine, onetouch ultra test, freestyle libre 3 sensor, calcium carbonate, cholecalciferol (vitamin d3), fluticasone propionate, glucose, hydrocortisone, jardiance, levothyroxine, tradjenta, metoprolol tartrate, pen needle, diabetic, pravastatin, prednisone, sertraline, tacrolimus, and [DISCONTINUED] alendronate.    Physical Exam   BP 119/69 (BP Site: L Arm, BP Position: Sitting, BP Cuff Size: Medium)  - Pulse 75  -  Ht 172.7 cm (5' 8)  - Wt 84.6 kg (186 lb 9.6 oz)  - SpO2 98%  - BMI 28.37 kg/m??     Gen: Well-appearing, no acute distress  Eyes: no scleral icterus.  HENT:  No temporal wasting  GI: Nontender, distended abdomen    Labs and imaging reviewed.    Assessment & Recommendations     Anthony Alvarado is a 63 y.o. man with inflammatory colonic Crohn's disease on risankizumab, and cryptogenic cirrhosis status post liver transplantation who returns today for ongoing care.    He was recently diagnosed with Norovirus infection, and symptoms have started to resolve.  However, he has not yet returned to baseline.  This is to be expected after recent Norovirus infection.  I recommend that he continue risankizumab.  We will plan for visit in 3 months with biochemical testing at that time.    Second, colonoscopy in March demonstrated colitis with areas indefinite for dysplasia.  This potentially represents false positive admits to background of active colitis.  Nonetheless, he will require future colonoscopy with chromoendoscopy once he is achieved remission with risankizumab.    Third, he is at risk for vitamin and mineral deficiencies.  Testing in the fall demonstrated a normal vitamin D and vitamin B12, but iron was mildly low.  He has labs pending.    PLAN:  Continue risankizumab  Labs  Colonoscopy in fall 2025    Return to clinic in 3 months.    Colonel Dears, MD MS  [facility]  [facility]   diabetic, pravastatin, prednisone, sertraline, tacrolimus, and [DISCONTINUED] alendronate.    Physical Exam   There were no vitals taken for this visit.    Gen: Well-appearing, no acute distress  Eyes: no scleral icterus.  HENT:  No temporal wasting  GI: Nontender, non-distended.    Labs and imaging reviewed.    Assessment & Recommendations     Anthony Alvarado is a 63 y.o. man with history of cryptogenic cirrhosis status post liver transplant and previous suspicion for mycophenolate induced colitis, with now recent diagnosis of colonic Crohn's disease, who presents to establish care. Moderate-to-severe Crohn's disease is a chronic illness that presents a threat to bodily function in the near term without treatment or despite treatment.      -Continue Skyrizi      -Colonosocpy with chromoendoscopy in September        He is mildly to moderately symptomatic today from his Crohn's disease.    He was previously diagnosed with acute colitis that required inpatient hospitalization, treatment with IV corticosteroids and inpatient infliximab.  However, given endoscopic appearance of his colitis as well as pathology (absence of chronicity),, he was diagnosed with drug-induced colitis to mycophenolate rather than colonic Crohn's disease.  Since he has now had recurrence of disease in the absence of mycophenolate use along with an endoscopic appearance consistent with Crohn's disease, I feel that this is consistent with colonic Crohn's disease.  Of note, pathology from his recent colonoscopy remains pending.    For management, we will proceed with risankizumab.  This medication was chosen to balance efficacy as well as targeted immune suppression in combination with his tacrolimus.  We discussed the risks of this medication, including risks of infection.    Second, he is at risk for vitamin and mineral deficiencies.  Testing in the fall demonstrated a normal vitamin D and vitamin B12, but iron was mildly low.  We will recheck this today.  Last, he has experiencing chronic symmetrical arthralgias. This likely represents enteropathic arthritis.    PLAN:  Labs today  Start risankizumab    Return to clinic in 3 months.    Colonel Dears, MD MS  [facility]  [facility] 

## 2023-08-04 ENCOUNTER — Ambulatory Visit
Admit: 2023-08-04 | Discharge: 2023-08-05 | Payer: Medicare (Managed Care) | Attending: Gastroenterology | Primary: Gastroenterology

## 2023-08-04 DIAGNOSIS — K50111 Crohn's disease of large intestine with rectal bleeding: Principal | ICD-10-CM

## 2023-08-04 DIAGNOSIS — K9089 Other intestinal malabsorption: Principal | ICD-10-CM

## 2023-08-04 DIAGNOSIS — D126 Benign neoplasm of colon, unspecified: Principal | ICD-10-CM

## 2023-08-04 MED ORDER — HUMULIN N NPH U-100 INSULIN KWIKPEN 100 UNIT/ML (3 ML) SUBCUTANEOUS
SUBCUTANEOUS | 3 refills | 0.00000 days | Status: CP
Start: 2023-08-04 — End: ?

## 2023-08-04 MED ORDER — INSULIN LISPRO (U-100) 100 UNIT/ML SUBCUTANEOUS PEN
SUBCUTANEOUS | 3 refills | 0.00000 days | Status: CP
Start: 2023-08-04 — End: ?

## 2023-08-04 MED FILL — TACROLIMUS 1 MG CAPSULE, IMMEDIATE-RELEASE: ORAL | 30 days supply | Qty: 150 | Fill #1

## 2023-08-04 MED FILL — FREESTYLE LIBRE 3 PLUS SENSOR DEVICE: ORAL | 28 days supply | Qty: 2 | Fill #6

## 2023-08-04 MED FILL — PEN NEEDLE, DIABETIC 32 GAUGE X 5/32" (4 MM): ORAL | 25 days supply | Qty: 100 | Fill #0

## 2023-08-04 NOTE — Unmapped (Signed)
 It was a pleasure seeing you today.  Here is a summary/wrap up from today's visit:     Continue Skyrizi  Schedule colonoscopy in fall 2025.  [id number], option 1, then option 2  If any trouble or symptoms, do not hesitate to call.     Colonel Dears, MD MS  [facility]  [facility] 

## 2023-08-04 NOTE — Unmapped (Signed)
 [facility] Endocrinology at [facility]  [address]  [place], [place] [address]    Clinical Pharmacist Note: Manufacturer Assistance Program (MAP) Application    Patient was last seen by Dr. Gelene Kelly MD for diabetes management on 03/29/2023. Patient was referred to this CPP to complete manufacturer assistance program (MAP) application(s) for the following medication(s):    Medication(s): HUMULIN N KWIKPEN, HUMALOG KWIKPEN     Manufacturer: [employer]    Medication Shipment Location: PATIENT'S HOME     Patient Portion of Application: PROOF OF INCOME MISSING.  PATIENT SECTIONS REVIEWED, COMPLETED, AND SIGNED BY PATIENT.    Provider Portion of Application: PRESCRIBER SECTIONS COMPLETED AND SIGNED BY CPP/ENDOCRINOLOGY PROVIDER.     Prescriptions: PRESCRIPTIONS ARE ON PATIENT'S MEDICATION LIST AND HAVE BEEN SENT ELECTRONICALLY TO [employer] CONTRACTED [facility] - [facility]    Application Faxed on: 08/04/23 BY CPP WITH CONFIRMATION.      Approval: PENDING.    Refill Process:  AUTO REFILLS    Patient was informed that it may take up to 5 business days for their application(s) to be processed once faxed. If approved, it may take an additional 14 business days for medications to be delivered to patient's home.     Advised patient to call:   [employer] at [phone] (Monday through Friday, 9:00 AM to 5:00 PM to check on the status of their application or to request refills on their medication(s).     ECP referral to check on application status.     Livia Riffle?? PharmD, CPP, BCACP, CDCES  Clinical Pharmacist Practitioner - Endocrinology  [facility] [place]  Phone: [phone] - Fax: [phone]

## 2023-08-09 DIAGNOSIS — Z5181 Encounter for therapeutic drug level monitoring: Principal | ICD-10-CM

## 2023-08-09 DIAGNOSIS — E612 Magnesium deficiency: Principal | ICD-10-CM

## 2023-08-09 DIAGNOSIS — Z944 Liver transplant status: Principal | ICD-10-CM

## 2023-08-13 DIAGNOSIS — K50111 Crohn's disease of large intestine with rectal bleeding: Secondary | ICD-10-CM | POA: Diagnosis not present

## 2023-08-15 DIAGNOSIS — G4733 Obstructive sleep apnea (adult) (pediatric): Secondary | ICD-10-CM | POA: Diagnosis not present

## 2023-08-16 DIAGNOSIS — E612 Magnesium deficiency: Principal | ICD-10-CM

## 2023-08-16 DIAGNOSIS — Z944 Liver transplant status: Principal | ICD-10-CM

## 2023-08-16 DIAGNOSIS — Z5181 Encounter for therapeutic drug level monitoring: Principal | ICD-10-CM

## 2023-08-18 DIAGNOSIS — K50111 Crohn's disease of large intestine with rectal bleeding: Principal | ICD-10-CM

## 2023-08-18 MED ORDER — SKYRIZI 360 MG/2.4 ML (150 MG/ML) SUBCUTANEOUS WEARABLE INJECTOR
SUBCUTANEOUS | 2 refills | 0.00000 days | Status: CP
Start: 2023-08-18 — End: ?
  Filled 2023-08-23: qty 2.4, 56d supply, fill #0

## 2023-08-18 NOTE — Unmapped (Signed)
 Skyrizi  OBI due 08/15, week 12, rx sent in to [facility]  Med therapy encounter on 05/13/23

## 2023-08-19 ENCOUNTER — Other Ambulatory Visit: Payer: Self-pay | Admitting: Family Medicine

## 2023-08-19 MED ORDER — PRAVASTATIN 40 MG TABLET
ORAL_TABLET | Freq: Every evening | ORAL | 2 refills | 30.00000 days
Start: 2023-08-19 — End: ?

## 2023-08-19 NOTE — Unmapped (Signed)
 This onboarding is for the following medication:  Skyrizi       Psychologist, prison and probation services and [facility]    Patient Onboarding/Medication Counseling    Anthony Alvarado is a 63 y.o. male with chron's disease who I am counseling today on initiation of therapy.  I am speaking to the patient.    Was a Nurse, learning disability used for this call? No    Verified patient's date of birth / HIPAA.    Specialty medication(s) to be sent: Inflammatory Disorders: Skyrizi       Non-specialty medications/supplies to be sent: levothyroxine  , pravastatin  40mg , sertraline  50mg       Medications not needed at this time: n/a         Skyrizi  (risankizumab )    Medication & Administration     Dosage: Crohn's Disease, Maintenance: Inject 360 mg under the skin at week 12 and every 8 weeks thereafter    Lab tests required prior to treatment initiation:  Tuberculosis: Tuberculosis screening not documented in the patient's chart but medication prescriber has indicated they are aware and wishing to initiate treatment at this time.  For Crohn's Disease, LFTs: LFTs complete and WNL.    Administration:   Systems developer all supplies needed for injection on a clean, flat working surface: medication pen removed from packaging, alcohol swab, sharps container, etc.  Look at the medication label - look for correct medication, correct dose, and check the expiration date  Look at the medication - the liquid visible in the window on the side of the pen device should appear clear and colorless to slightly yellow. It may contain tiny white or clear particles or bubbles.  Lay the auto-injector pen on a flat surface out of direct sunlight and allow it to warm up to room temperature for at least 30-90 minutes  Select injection site - you can use the front of your thigh or your belly (but not the area 2 inches around your belly button); if someone else is giving you the injection you can also use your upper arm in the skin covering your triceps muscle  Prepare injection site - wash your hands and clean the skin at the injection site with an alcohol swab and let it air dry, do not touch the injection site again before the injection  Hold the pen with your fingers on the gray grips and turn the pen so the white needle sleeve is pointed toward the injection site. You should see the green activator button.  Put the white needle cover against your skin at the injection site at a 90 degree angle, hold the pen such that you can see the clear medication window  Press down and hold the pen firmly against your skin, and press the green activator button. The pen only activates if the white needle sleeve is pressed down against your skin before pressing the activator button. A loud click indicates the start of the injection.   Continue to hold the pen firmly against your skin for about 15 seconds - the window will start to turn solid yellow  There will be a second click sound when the injection is almost complete, verify the window is solid yellow to indicate the injection is complete and then pull the pen away from your skin  Dispose of the used auto-injector pen immediately in your sharps disposal container the needle will be covered automatically  If you see any blood at the injection site, press a cotton ball or gauze on the site  and maintain pressure until the bleeding stops, do not rub the injection site    Adherence/Missed dose instructions:  If your injection is given more than 7 days after your scheduled injection date - consult your pharmacist for additional instructions on how to adjust your dosing schedule.    Goals of Therapy     Plaque psoriasis:  Minimize areas of skin involvement (% BSA)  Avoidance of long term glucocorticoid use  Maintenance of effective psychosocial functioning    Psoriatic arthritis  Reduce the number of swollen and tender joints  Reduce pain and disability associated with disease  Improve physical function, general health, vitality, social functioning, and mental health     Crohn's disease:  Achieve clinical remission  Achieve endoscopic remission  Reduce CDAI scores      Side Effects & Monitoring Parameters     Common side effects:  Injection site reaction (redness, irritation, inflammation localized to the site of administration)  Signs of a common cold - minor sore throat, runny or stuffy nose, etc.  Felling tired/weak  Headache    The following side effects should be reported to the provider:  Signs of a hypersensitivity reaction - rash; hives; itching; red, swollen, blistered, or peeling skin; wheezing; tightness in the chest or throat; difficulty breathing, swallowing, or talking; swelling of the mouth, face, lips, tongue, or throat; etc.  Reduced immune function - report signs of infection such as fever; chills; body aches; very bad sore throat; ear or sinus pain; cough; more sputum or change in color of sputum; pain with passing urine; wound that will not heal, etc.  Also at a slightly higher risk of some malignancies (mainly skin and blood cancers) due to this reduced immune function.  In the case of signs of infection - the patient should hold the next dose of Skyrizi ?? and call your primary care provider to ensure adequate medical care.  Treatment may be resumed when infection is treated and patient is asymptomatic.  Flu-like symptoms  Warm, red, or painful skin or sores on the body      Contraindications, Warnings, & Precautions     Have your bloodwork checked as you have been told by your prescriber  Talk with your doctor if you are pregnant, planning to become pregnant, or breastfeeding  Discuss the possible need for holding your dose(s) of Skyrizi ?? when a planned procedure is scheduled with the prescriber as it may delay healing/recovery timeline       Drug/Food Interactions     Medication list reviewed in Epic. The patient was instructed to inform the care team before taking any new medications or supplements. No drug interactions identified.   Talk with you prescriber or pharmacist before receiving any live vaccinations while taking this medication and after you stop taking it    Storage, Handling Precautions, & Disposal     Store this medication in the refrigerator.  Do not freeze  If needed, you may store at room temperature for up to 24 hours  Store in original packaging, protected from light  Do not shake  Dispose of used syringes/pens in a sharps disposal container          Current Medications (including OTC/herbals), Comorbidities and Allergies     Current Medications[1]    Allergies[2]    Problem List[3]    Medication list has been reviewed and updated in Epic: Yes    Allergies have been reviewed and updated in Epic: Yes    Appropriateness of Therapy  Acute infections noted within Epic:  No active infections  Patient reported infection: None    Is the medication and dose appropriate based on diagnosis, medication list, comorbidities, allergies, medical history, patient???s ability to self-administer the medication, and therapeutic goals? Yes    Prescription has been clinically reviewed: Yes      Baseline Quality of Life Assessment      Financial Information     Medication Assistance provided: None Required    Anticipated copay of $0 reviewed with patient. Verified delivery address.    Delivery Information     Scheduled delivery date: 7/1    Expected start date: n/a      Medication will be delivered via UPS to the prescription address in Epic [place].  This shipment will not require a signature.      Explained the services we provide at [facility] and that each month we would call to set up refills.  Stressed importance of returning phone calls so that we could ensure they receive their medications in time each month.  Informed patient that we should be setting up refills 7-10 days prior to when they will run out of medication.  A pharmacist will reach out to perform a clinical assessment periodically.  Informed patient that a welcome packet, containing information about our pharmacy and other support services, a Notice of Privacy Practices, and a drug information handout will be sent.      The patient or caregiver noted above participated in the development of this care plan and knows that they can request review of or adjustments to the care plan at any time.      Patient or caregiver verbalized understanding of the above information as well as how to contact the pharmacy at [phone] option 4 with any questions/concerns.  The pharmacy is open Monday through Friday 8:30am-4:30pm.  A pharmacist is available 24/7 via pager to answer any clinical questions they may have.    Patient Specific Needs     Does the patient have any physical, cognitive, or cultural barriers? No    Does the patient have adequate living arrangements? (i.e. the ability to store and take their medication appropriately) Yes    Did you identify any home environmental safety or security hazards? No    Patient prefers to have medications discussed with  Patient     Is the patient or caregiver able to read and understand education materials at a high school level or above? Yes    Patient's primary language is  English     Is the patient high risk? No    Does the patient have an additional or emergency contact listed in their chart? Yes    SOCIAL DETERMINANTS OF HEALTH     At the [facility], we have learned that life circumstances - like trouble affording food, housing, utilities, or transportation can affect the health of many of our patients.   That is why we wanted to ask: are you currently experiencing any life circumstances that are negatively impacting your health and/or quality of life? Patient declined to answer    Social Drivers of Health     Food Insecurity: No Food Insecurity (01/04/2023)    Received from [facility]    Hunger Vital Sign     Within the past 12 months, you worried that your food would run out before you got the money to buy more.: Never true     Within the past 12 months,  the food you bought just didn't last and you didn't have money to get more.: Never true   Tobacco Use: Low Risk  (05/13/2023)    Patient History     Smoking Tobacco Use: Never     Smokeless Tobacco Use: Never     Passive Exposure: Not on file   Transportation Needs: No Transportation Needs (01/04/2023)    Received from [facility] - Transportation     Lack of Transportation (Medical): No     Lack of Transportation (Non-Medical): No   Alcohol Use: Not on file   Housing: Low Risk  (09/30/2022)    Housing     Within the past 12 months, have you ever stayed: outside, in a car, in a tent, in an overnight shelter, or temporarily in someone else's home (i.e. couch-surfing)?: No     Are you worried about losing your housing?: No   Physical Activity: Insufficiently Active (01/04/2023)    Received from [facility]    Exercise Vital Sign     On average, how many days per week do you engage in moderate to strenuous exercise (like a brisk walk)?: 1 day     On average, how many minutes do you engage in exercise at this level?: 10 min   Utilities: Not At Risk (12/31/2022)    Received from [facility] Utilities     Threatened with loss of utilities: No   Stress: No Stress Concern Present (01/04/2023)    Received from [facility] - Occupational Stress Questionnaire     Feeling of Stress : Only a little   Interpersonal Safety: Not At Risk (05/13/2023)    Interpersonal Safety     Unsafe Where You Currently Live: No     Physically Hurt by Anyone: No     Abused by Anyone: No   Substance Use: Not on file (12/28/2022)   Intimate Partner Violence: Not At Risk (01/04/2023)    Received from [facility]    Humiliation, Afraid, Rape, and Kick questionnaire     Within the last year, have you been afraid of your partner or ex-partner?: No     Within the last year, have you been humiliated or emotionally abused in other ways by your partner or ex-partner?: No     Within the last year, have you been kicked, hit, slapped, or otherwise physically hurt by your partner or ex-partner?: No     Within the last year, have you been raped or forced to have any kind of sexual activity by your partner or ex-partner?: No   Social Connections: Moderately Integrated (01/04/2023)    Received from [facility]    Social Connection and Isolation Panel     In a typical week, how many times do you talk on the phone with family, friends, or neighbors?: More than three times a week     How often do you get together with friends or relatives?: Twice a week     How often do you attend church or religious services?: More than 4 times per year     Do you belong to any clubs or organizations such as church groups, unions, fraternal or athletic groups, or school groups?: Yes     How often do you attend meetings of the clubs or organizations you belong to?: 1 to 4 times per year     Are you married, widowed, divorced, separated, never  married, or living with a partner?: Separated   Financial Resource Strain: Medium Risk (01/04/2023)    Received from [facility]    Overall Financial Resource Strain (CARDIA)     Difficulty of Paying Living Expenses: Somewhat hard   Health Literacy: Adequate Health Literacy (01/04/2023)    Received from [facility]    B1300 Health Literacy     Frequency of need for help with medical instructions: Rarely   Internet Connectivity: Not on file       Would you be willing to receive help with any of the needs that you have identified today? Not applicable       Harlene Gal, PharmD  [facility] Specialty Pharmacist       [1]   Current Outpatient Medications   Medication Sig Dispense Refill    aspirin  325 MG tablet Take 1 tablet (325 mg total) by mouth daily.      azelastine (ASTELIN) 137 mcg (0.1 %) nasal spray 1 spray into each nostril daily as needed.      blood sugar diagnostic (ONETOUCH ULTRA TEST) Strp USE TO CHECK SUGAR DAILY      blood-glucose sensor (FREESTYLE LIBRE 3 SENSOR) Devi For use as directed. change every 14 days. 3 each 11    calcium carbonate (TUMS) 200 mg calcium (500 mg) chewable tablet Chew 1 tablet (500 mg total) daily. Pt reports taking every other day. (Patient taking differently: Chew 1 tablet (500 mg total) as needed.)      cholecalciferol , vitamin D3, (VITAMIN D3 ORAL) Take by mouth. (Patient taking differently: Take by mouth in the morning.)      fluticasone propionate (FLONASE) 50 mcg/actuation nasal spray PLACE ONE OR TWO SPRAYS INTO BOTH NOSTRILS DAILY AS NEEDED.      glucose 4 GM chewable tablet Chew 4 tablets (16 g total) every ten (10) minutes as needed for low blood sugar ((For Blood Glucose LESS than 70 mg/dL and GREATER than or EQUAL to 54 mg/dL and able to take by mouth.)). 50 tablet 12    hydrocortisone  1 % crpe  (Patient taking differently: as needed.)      insulin  lispro (HUMALOG  KWIKPEN INSULIN ) 100 unit/mL injection pen Inject up to 25 units under the skin three times daily before meals (75 units per day). 90 mL 3    insulin  NPH isoph U-100 human (HUMULIN  N NPH INSULIN  KWIKPEN) 100 unit/mL (3 mL) injection pen Inject up to 25 units under the skin once daily as directed. 30 mL 3    JARDIANCE  25 mg tablet Take one tablet by mouth daily. 90 tablet 1    levothyroxine  (SYNTHROID ) 175 MCG tablet Take 1 tablet (175 mcg total) by mouth daily before breakfast. 90 tablet 2    linagliptin  (TRADJENTA ) 5 mg Tab Take 1 tablet (5 mg total) by mouth daily.      metoPROLOL  tartrate (LOPRESSOR ) 25 MG tablet Take 1 tablet (25 mg total) by mouth two (2) times a day. 180 tablet 1    pen needle, diabetic 32 gauge x 5/32 (4 mm) Ndle Use with insulin  up to 4 times/day as needed. 100 each 1    pravastatin  (PRAVACHOL ) 40 MG tablet Take 1 tablet (40 mg total) by mouth every evening. 30 tablet 2    predniSONE  (DELTASONE ) 5 MG tablet Take 8 tablets (40 mg total) by mouth daily for 14 days, THEN 7 tablets (35 mg total) daily for 7 days, THEN 6 tablets (30 mg total) daily for  7 days, THEN 5 tablets (25 mg total) daily for 7 days, THEN 4 tablets (20 mg total) daily for 7 days, THEN 3 tablets (15 mg total) daily for 7 days, THEN 2 tablets (10 mg total) daily for 7 days, THEN 1 tablet (5 mg total) daily for 7 days. 308 tablet 0    risankizumab -rzaa (SKYRIZI ) 360 mg/2.4 mL (150 mg/mL) Injt Inject the contents of 1 cartridge (360mg ) under the skin every 8 weeks 2.4 mL 2    sertraline  (ZOLOFT ) 50 MG tablet Take 1 tablet (50 mg total) by mouth daily. 90 tablet 3    tacrolimus  (PROGRAF ) 1 MG capsule Take 3 capsules (3 mg total) by mouth daily AND 2 capsules (2 mg total) nightly. 150 capsule 11     No current facility-administered medications for this visit.   [2]   Allergies  Allergen Reactions    Mycophenolate  Mofetil Diarrhea     Mycophenolate  colitis     Watermelon Flavor      Mouth itching   [3]   Patient Active Problem List  Diagnosis    Diabetes mellitus without complication       Hypothyroidism    Cirrhosis of liver without ascites       OSA (obstructive sleep apnea)    Essential hypertension    Encounter for pre-transplant evaluation for chronic liver disease    Pleural effusion    Pneumothorax after biopsy    Liver transplant recipient       Allergic rhinitis    Arthralgia    Asthma (HHS-HCC)    Bicuspid aortic valve    Flying phobia    Mucosal abnormality of stomach    Nephrolithiasis    Pure hypercholesterolemia    Umbilical hernia    Unspecified hearing loss    COVID-19    Colitis    Hemorrhoids, external    Steatosis of liver    Steroid-induced hyperglycemia    Leukocytosis    Hyponatremia with increased serum osmolality    Coronary artery disease involving native coronary artery of native heart without angina pectoris    Osteopenia    S/P AVR (aortic valve replacement)    Crohn's disease of large intestine with rectal bleeding       Dysplasia of colon

## 2023-08-19 NOTE — Unmapped (Signed)
 [facility] Specialty Medication Onboarding    Specialty Medication: Skyrizi   Prior Authorization: Not Required   Financial Assistance: No - copay  <$25  Final Copay/Day Supply: $0 / 56 days    Insurance Restrictions: None     Notes to Pharmacist: Pt already enrolled in SOT  Credit Card on File: not applicable  Start Date on Rx:  10/08/23    The triage team has completed the benefits investigation and has determined that the patient is able to fill this medication at [facility]. Please contact the patient to complete the onboarding or follow up with the prescribing physician as needed.

## 2023-08-20 MED ORDER — PRAVASTATIN 40 MG TABLET
ORAL_TABLET | Freq: Every evening | ORAL | 2 refills | 30.00000 days
Start: 2023-08-20 — End: ?

## 2023-08-23 ENCOUNTER — Other Ambulatory Visit: Payer: Self-pay | Admitting: Family Medicine

## 2023-08-23 DIAGNOSIS — E612 Magnesium deficiency: Principal | ICD-10-CM

## 2023-08-23 DIAGNOSIS — Z5181 Encounter for therapeutic drug level monitoring: Principal | ICD-10-CM

## 2023-08-23 DIAGNOSIS — Z944 Liver transplant status: Principal | ICD-10-CM

## 2023-08-23 MED ORDER — SERTRALINE 50 MG TABLET
ORAL_TABLET | Freq: Every day | ORAL | 3 refills | 90.00000 days
Start: 2023-08-23 — End: ?

## 2023-08-23 MED FILL — LEVOTHYROXINE 175 MCG TABLET: ORAL | 90 days supply | Qty: 90 | Fill #2

## 2023-08-23 MED FILL — PRAVASTATIN 40 MG TABLET: ORAL | 30 days supply | Qty: 30 | Fill #0

## 2023-08-24 MED ORDER — SERTRALINE 50 MG TABLET
ORAL_TABLET | Freq: Every day | ORAL | 0 refills | 90.00000 days
Start: 2023-08-24 — End: ?

## 2023-08-25 DIAGNOSIS — Z944 Liver transplant status: Principal | ICD-10-CM

## 2023-08-25 NOTE — Unmapped (Signed)
 Provider has requested to reschedule appointment on 09/09/23. Call patient to make aware of the changes. Patient has confirmed and agree to the new date and time  09/28/23 at 900 am labs  09/28/23 at 930 am Anthony Alvarado

## 2023-08-30 DIAGNOSIS — Z5181 Encounter for therapeutic drug level monitoring: Principal | ICD-10-CM

## 2023-08-30 DIAGNOSIS — Z944 Liver transplant status: Principal | ICD-10-CM

## 2023-08-30 DIAGNOSIS — E612 Magnesium deficiency: Principal | ICD-10-CM

## 2023-09-06 DIAGNOSIS — E612 Magnesium deficiency: Principal | ICD-10-CM

## 2023-09-06 DIAGNOSIS — Z944 Liver transplant status: Principal | ICD-10-CM

## 2023-09-06 DIAGNOSIS — Z5181 Encounter for therapeutic drug level monitoring: Principal | ICD-10-CM

## 2023-09-08 NOTE — Unmapped (Signed)
 [facility] Clinical Assessment & Refill Coordination Note    Anthony Alvarado, [name]: January 29, 1961  Phone: [phone] (home)     All above HIPAA information was verified with patient.     Was a Nurse, learning disability used for this call? No    Specialty Medication(s):   Transplant: tacrolimus  1mg      Current Medications[1]     Changes to medications: Martise reports no changes at this time.    Medication list has been reviewed and updated in Epic: Yes    Allergies[2]    Changes to allergies: No    Allergies have been reviewed and updated in Epic: Yes    SPECIALTY MEDICATION ADHERENCE     Tacrolimus  1 mg: 4 days of medicine on hand   Skyrizi  360/2.4 mg/ml: 30 days of medicine on hand       Medication Adherence    Patient reported X missed doses in the last month: 0  Specialty Medication: Tacrolimus  1mg   Patient is on additional specialty medications: Yes  Additional Specialty Medications: Skyrizi  360mg /2.19mL  Patient Reported Additional Medication X Missed Doses in the Last Month: 0  Patient is on more than two specialty medications: No          Specialty medication(s) dose(s) confirmed: Regimen is correct and unchanged.     Are there any concerns with adherence? No    Adherence counseling provided? Not needed    CLINICAL MANAGEMENT AND INTERVENTION      Clinical Benefit Assessment:    Do you feel the medicine is effective or helping your condition? Yes    Clinical Benefit counseling provided? Not needed    Adverse Effects Assessment:    Are you experiencing any side effects? No    Are you experiencing difficulty administering your medicine? No    Quality of Life Assessment:    Quality of Life    Rheumatology  Oncology  Dermatology  Cystic Fibrosis          How many days over the past month did your liver transplant  keep you from your normal activities? For example, brushing your teeth or getting up in the morning. 0    Have you discussed this with your provider? Not needed    Acute Infection Status:    Acute infections noted within Epic:  No active infections    Patient reported infection: None    Therapy Appropriateness:    Is therapy appropriate based on current medication list, adverse reactions, adherence, clinical benefit and progress toward achieving therapeutic goals? Yes, therapy is appropriate and should be continued     Clinical Intervention:    Was an intervention completed as part of this clinical assessment? No    DISEASE/MEDICATION-SPECIFIC INFORMATION      N/A    Solid Organ Transplant: Not Applicable    PATIENT SPECIFIC NEEDS     Does the patient have any physical, cognitive, or cultural barriers? No    Is the patient high risk? No    Does the patient require physician intervention or other additional services (i.e., nutrition, smoking cessation, social work)? No    Does the patient have an additional or emergency contact listed in their chart? Yes    SOCIAL DETERMINANTS OF HEALTH     At the [facility], we have learned that life circumstances - like trouble affording food, housing, utilities, or transportation can affect the health of many of our patients.   That is why we wanted to ask:  are you currently experiencing any life circumstances that are negatively impacting your health and/or quality of life? Patient declined to answer    Social Drivers of Health     Food Insecurity: No Food Insecurity (01/04/2023)    Received from [facility]    Hunger Vital Sign     Within the past 12 months, you worried that your food would run out before you got the money to buy more.: Never true     Within the past 12 months, the food you bought just didn't last and you didn't have money to get more.: Never true   Tobacco Use: Low Risk  (05/13/2023)    Patient History     Smoking Tobacco Use: Never     Smokeless Tobacco Use: Never     Passive Exposure: Not on file   Transportation Needs: No Transportation Needs (01/04/2023)    Received from [facility] - Transportation     Lack of Transportation (Medical): No     Lack of Transportation (Non-Medical): No   Alcohol Use: Not on file   Housing: Low Risk  (09/30/2022)    Housing     Within the past 12 months, have you ever stayed: outside, in a car, in a tent, in an overnight shelter, or temporarily in someone else's home (i.e. couch-surfing)?: No     Are you worried about losing your housing?: No   Physical Activity: Insufficiently Active (01/04/2023)    Received from [facility]    Exercise Vital Sign     On average, how many days per week do you engage in moderate to strenuous exercise (like a brisk walk)?: 1 day     On average, how many minutes do you engage in exercise at this level?: 10 min   Utilities: Not At Risk (12/31/2022)    Received from [facility] Utilities     Threatened with loss of utilities: No   Stress: No Stress Concern Present (01/04/2023)    Received from [facility] of Occupational Health - Occupational Stress Questionnaire     Feeling of Stress : Only a little   Interpersonal Safety: Not At Risk (05/13/2023)    Interpersonal Safety     Unsafe Where You Currently Live: No     Physically Hurt by Anyone: No     Abused by Anyone: No   Substance Use: Not on file (12/28/2022)   Intimate Partner Violence: Not At Risk (01/04/2023)    Received from [facility]    Humiliation, Afraid, Rape, and Kick questionnaire     Within the last year, have you been afraid of your partner or ex-partner?: No     Within the last year, have you been humiliated or emotionally abused in other ways by your partner or ex-partner?: No     Within the last year, have you been kicked, hit, slapped, or otherwise physically hurt by your partner or ex-partner?: No     Within the last year, have you been raped or forced to have any kind of sexual activity by your partner or ex-partner?: No   Social Connections: Moderately Integrated (01/04/2023)    Received from [facility]    Social Connection and Isolation Panel     In a typical week, how many times do you talk on the phone with family, friends, or neighbors?: More than three times a week     How often do you get  together with friends or relatives?: Twice a week     How often do you attend church or religious services?: More than 4 times per year     Do you belong to any clubs or organizations such as church groups, unions, fraternal or athletic groups, or school groups?: Yes     How often do you attend meetings of the clubs or organizations you belong to?: 1 to 4 times per year     Are you married, widowed, divorced, separated, never married, or living with a partner?: Separated   Financial Resource Strain: Medium Risk (01/04/2023)    Received from [facility]    Overall Financial Resource Strain (CARDIA)     Difficulty of Paying Living Expenses: Somewhat hard   Health Literacy: Adequate Health Literacy (01/04/2023)    Received from [facility]    B1300 Health Literacy     Frequency of need for help with medical instructions: Rarely   Internet Connectivity: Not on file       Would you be willing to receive help with any of the needs that you have identified today? Not applicable       SHIPPING     Specialty Medication(s) to be Shipped:   Transplant: tacrolimus  1mg     Other medication(s) to be shipped: Sertraline  50mg , Pravastatin  40mg      Changes to insurance: No    Cost and Payment: Patient has a $0 copay, payment information is not required.    Delivery Scheduled: Yes, Expected medication delivery date: 7/18.     Medication will be delivered via UPS to the confirmed prescription address in [facility].    The patient will receive a drug information handout for each medication shipped and additional FDA Medication Guides as required.  Verified that patient has previously received a Conservation officer, historic buildings and a Surveyor, mining.    The patient or caregiver noted above participated in the development of this care plan and knows that they can request review of or adjustments to the care plan at any time.      All of the patient's questions and concerns have been addressed.    Harlene Gal, PharmD   [facility] Specialty Pharmacist         [1]   Current Outpatient Medications   Medication Sig Dispense Refill    aspirin  325 MG tablet Take 1 tablet (325 mg total) by mouth daily.      azelastine (ASTELIN) 137 mcg (0.1 %) nasal spray 1 spray into each nostril daily as needed.      blood sugar diagnostic (ONETOUCH ULTRA TEST) Strp USE TO CHECK SUGAR DAILY      blood-glucose sensor (FREESTYLE LIBRE 3 SENSOR) Devi For use as directed. change every 14 days. 3 each 11    calcium carbonate (TUMS) 200 mg calcium (500 mg) chewable tablet Chew 1 tablet (500 mg total) daily. Pt reports taking every other day. (Patient taking differently: Chew 1 tablet (500 mg total) as needed.)      cholecalciferol , vitamin D3, (VITAMIN D3 ORAL) Take by mouth. (Patient taking differently: Take by mouth in the morning.)      fluticasone propionate (FLONASE) 50 mcg/actuation nasal spray PLACE ONE OR TWO SPRAYS INTO BOTH NOSTRILS DAILY AS NEEDED.      glucose 4 GM chewable tablet Chew 4 tablets (16 g total) every ten (10) minutes as needed for low blood sugar ((For Blood Glucose LESS than 70 mg/dL and GREATER than or  EQUAL to 54 mg/dL and able to take by mouth.)). 50 tablet 12    hydrocortisone  1 % crpe  (Patient taking differently: as needed.)      insulin  lispro (HUMALOG  KWIKPEN INSULIN ) 100 unit/mL injection pen Inject up to 25 units under the skin three times daily before meals (75 units per day). 90 mL 3    insulin  NPH isoph U-100 human (HUMULIN  N NPH INSULIN  KWIKPEN) 100 unit/mL (3 mL) injection pen Inject up to 25 units under the skin once daily as directed. 30 mL 3    JARDIANCE  25 mg tablet Take one tablet by mouth daily. 90 tablet 1    levothyroxine  (SYNTHROID ) 175 MCG tablet Take 1 tablet (175 mcg total) by mouth daily before breakfast. 90 tablet 2    linagliptin  (TRADJENTA ) 5 mg Tab Take 1 tablet (5 mg total) by mouth daily.      metoPROLOL  tartrate (LOPRESSOR ) 25 MG tablet Take 1 tablet (25 mg total) by mouth two (2) times a day. 180 tablet 1    pen needle, diabetic 32 gauge x 5/32 (4 mm) Ndle Use with insulin  up to 4 times/day as needed. 100 each 1    pravastatin  (PRAVACHOL ) 40 MG tablet Take 1 tablet (40 mg total) by mouth every evening. 30 tablet 2    predniSONE  (DELTASONE ) 5 MG tablet Take 8 tablets (40 mg total) by mouth daily for 14 days, THEN 7 tablets (35 mg total) daily for 7 days, THEN 6 tablets (30 mg total) daily for 7 days, THEN 5 tablets (25 mg total) daily for 7 days, THEN 4 tablets (20 mg total) daily for 7 days, THEN 3 tablets (15 mg total) daily for 7 days, THEN 2 tablets (10 mg total) daily for 7 days, THEN 1 tablet (5 mg total) daily for 7 days. 308 tablet 0    risankizumab -rzaa (SKYRIZI ) 360 mg/2.4 mL (150 mg/mL) Injt Inject the contents of 1 cartridge (360mg ) under the skin every 8 weeks 2.4 mL 2    sertraline  (ZOLOFT ) 50 MG tablet Take 1 tablet (50 mg total) by mouth daily. 90 tablet 0    tacrolimus  (PROGRAF ) 1 MG capsule Take 3 capsules (3 mg total) by mouth daily AND 2 capsules (2 mg total) nightly. 150 capsule 11     No current facility-administered medications for this visit.   [2]   Allergies  Allergen Reactions    Mycophenolate  Mofetil Diarrhea     Mycophenolate  colitis     Watermelon Flavor      Mouth itching

## 2023-09-09 MED FILL — SERTRALINE 50 MG TABLET: ORAL | 90 days supply | Qty: 90 | Fill #0

## 2023-09-09 MED FILL — PRAVASTATIN 40 MG TABLET: ORAL | 30 days supply | Qty: 30 | Fill #1

## 2023-09-09 MED FILL — TACROLIMUS 1 MG CAPSULE, IMMEDIATE-RELEASE: ORAL | 30 days supply | Qty: 150 | Fill #2

## 2023-09-10 DIAGNOSIS — K50111 Crohn's disease of large intestine with rectal bleeding: Secondary | ICD-10-CM | POA: Diagnosis not present

## 2023-09-13 ENCOUNTER — Encounter: Admit: 2023-09-13 | Discharge: 2023-09-14 | Payer: Medicare (Managed Care)

## 2023-09-13 DIAGNOSIS — E1165 Type 2 diabetes mellitus with hyperglycemia: Principal | ICD-10-CM

## 2023-09-13 DIAGNOSIS — E612 Magnesium deficiency: Principal | ICD-10-CM

## 2023-09-13 DIAGNOSIS — Z5181 Encounter for therapeutic drug level monitoring: Principal | ICD-10-CM

## 2023-09-13 DIAGNOSIS — Z794 Long term (current) use of insulin: Principal | ICD-10-CM

## 2023-09-13 DIAGNOSIS — Z944 Liver transplant status: Principal | ICD-10-CM

## 2023-09-13 NOTE — Unmapped (Signed)
 The patient reports they are physically located in [place]  and is currently: at home. I conducted a audio/video visit. I spent  34m 58s on the video call with the patient.      ASSESSMENT and PLAN:   29 M s/p liver transplant 2022, recent MRI liver with some steatosis in transplant, obesity, T2DM     1. Type 2 diabetes mellitus with hyperglycemia, with long-term current use of insulin     (Primary)    Continue NPH 14 QAM  Continue Humalog  8 for breakfast, 10 units for lunch, 10 units with dinner  Plus Humalog  scale: for every 50 points of blood sugar above 150, add another 2 units.  Really focus on using the humalog  correctly. Education on using humalog  to cover meals even when blood glucose is normal and using before rather than after meals    Continue Jardiance  25 mg PO daily.  Continue Tradjenta  5 mg PO once daily.  confirmed with [employer] that patient is approved through 02/23/24 for Tradjenta  5 mg and Jardiance  25 mg #90 tablets for 90 day supply and is opted into automatic refills.  Has PCP appt next month and will have A1c checked there.  Reviewed signs/symptoms/treatment of hypoglycemia.   Continue Freestyle Libre 3 CGM after phone upgraded.   Microalb today  Optho: scheduled  Neuropathy: no neuropathy but has been feeling some ankle and arch pain. Suggested some arch support insoles    2. Hypothyroid:  Continue LT4 175    RTC in 2 months after PCP    SUBJECTIVE:   Anthony Alvarado is a 63 y.o. year old male with a history of diabetes post-transplant who presents today for a diabetes-related visit. PMH includes liver transplant (2022), hepatic steatosis, obesity, hypothyroidism, and HTN, CAD s/p CABG (2023)    INTERIM:  Still on Prednisone  10mg  in AM but tapering down to 5 mg next week  NPH decreased on his own to 14 or 15 due to worry abouit lows  Libre fell off and has not put back on  Using humalog  but often after meals because forgets to use before meals   Also not using humalog  if BG is normal before meals (which then leads to highs after)  Typically uses Humalog  14-18 units at dinner    Continues on Trajenta, pio and jardiance   No or rare hypos    Fasting AM: low 100's    DM history:  2018: had pre-DM before liver transplant and controlled with diet, transiently on insulin  but able to stop  2020: Liver transplant and since transplant has been on MTF and jardiance     Weight: since transplant has gained about 25 pounds; minimal exercise due to habit  Diet: cereal for breakfast, fast food at least once a day because too tired to cook    PCP Dr. 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    Cholestatic cirrhosis        Community acquired pneumonia 11/14/2022    GERD (gastroesophageal reflux disease)     Heart murmur 1997    BICUSPID AORTIC VALVE  Hepatic encephalopathy        Hyperlipidemia     Hypertension     Left hand pain 03/19/2021    Liver cancer    it was on my old liver before transplant    Mild intermittent asthma with exacerbation (HHS-HCC) 04/29/2022    Other osteoporosis without current pathological fracture     Paresthesia 01/06/2023    Pneumonia 10/2022    Rash 09/25/2009    Qualifier: Diagnosis of   By: Cleatus MD, Arlyss      Severe aortic stenosis 05/11/2023    Sleep apnea     Type 2 diabetes mellitus           Current Outpatient Medications   Medication Sig Dispense Refill    aspirin  325 MG tablet Take 1 tablet (325 mg total) by mouth daily.      azelastine (ASTELIN) 137 mcg (0.1 %) nasal spray 1 spray into each nostril daily as needed.      blood sugar diagnostic (ONETOUCH ULTRA TEST) Strp USE TO CHECK SUGAR DAILY      blood-glucose sensor (FREESTYLE LIBRE 3 SENSOR) Devi For use as directed. change every 14 days. 3 each 11    calcium carbonate (TUMS) 200 mg calcium (500 mg) chewable tablet Chew 1 tablet (500 mg total) daily. Pt reports taking every other day. (Patient taking differently: Chew 1 tablet (500 mg total) as needed.)      cholecalciferol , vitamin D3, (VITAMIN D3 ORAL) Take by mouth. (Patient taking differently: Take by mouth in the morning.)      fluticasone propionate (FLONASE) 50 mcg/actuation nasal spray PLACE ONE OR TWO SPRAYS INTO BOTH NOSTRILS DAILY AS NEEDED.      glucose 4 GM chewable tablet Chew 4 tablets (16 g total) every ten (10) minutes as needed for low blood sugar ((For Blood Glucose LESS than 70 mg/dL and GREATER than or EQUAL to 54 mg/dL and able to take by mouth.)). 50 tablet 12    hydrocortisone  1 % crpe  (Patient taking differently: as needed.)      insulin  lispro (HUMALOG  KWIKPEN INSULIN ) 100 unit/mL injection pen Inject up to 25 units under the skin three times daily before meals (75 units per day). 90 mL 3    insulin  NPH isoph U-100 human (HUMULIN  N NPH INSULIN  KWIKPEN) 100 unit/mL (3 mL) injection pen Inject up to 25 units under the skin once daily as directed. 30 mL 3    JARDIANCE  25 mg tablet Take one tablet by mouth daily. 90 tablet 1    levothyroxine  (SYNTHROID ) 175 MCG tablet Take 1 tablet (175 mcg total) by mouth daily before breakfast. 90 tablet 2    linagliptin  (TRADJENTA ) 5 mg Tab Take 1 tablet (5 mg total) by mouth daily.      metoPROLOL  tartrate (LOPRESSOR ) 25 MG tablet Take 1 tablet (25 mg total) by mouth two (2) times a day. 180 tablet 1    pen needle, diabetic 32 gauge x 5/32 (4 mm) Ndle Use with insulin  up to 4 times/day as needed. 100 each 1    pravastatin  (PRAVACHOL ) 40 MG tablet Take 1 tablet (40 mg total) by mouth every evening. 30 tablet 2    predniSONE  (DELTASONE ) 5 MG tablet Take 8 tablets (40 mg total) by mouth daily for 14 days, THEN 7 tablets (35 mg total) daily for 7 days, THEN 6 tablets (30 mg total) daily for 7 days, THEN 5 tablets (25 mg total) daily for 7 days, THEN 4 tablets (20  mg total) daily for 7 days, THEN 3 tablets (15 mg total) daily for 7 days, THEN 2 tablets (10 mg total) daily for 7 days, THEN 1 tablet (5 mg total) daily for 7 days. 308 tablet 0    risankizumab -rzaa (SKYRIZI ) 360 mg/2.4 mL (150 mg/mL) Injt Inject the contents of 1 cartridge (360mg ) under the skin every 8 weeks 2.4 mL 2    sertraline  (ZOLOFT ) 50 MG tablet Take 1 tablet (50 mg total) by mouth daily. 90 tablet 0    tacrolimus  (PROGRAF ) 1 MG capsule Take 3 capsules (3 mg total) by mouth daily AND 2 capsules (2 mg total) nightly. 150 capsule 11     No current facility-administered medications for this visit.       Allergies   Allergen Reactions    Mycophenolate  Mofetil Diarrhea     Mycophenolate  colitis     Watermelon Flavor      Mouth itching       Social History  Social History     Socioeconomic History    Marital status: Legally Separated     Spouse name: tammy    Number of children: 1   Tobacco Use    Smoking status: Never    Smokeless tobacco: Never   Vaping Use    Vaping status: Never Used   Substance and Sexual Activity    Alcohol use: Not Currently    Drug use: Never    Sexual activity: Yes     Partners: Female   Other Topics Concern    Do you use sunscreen? Yes    Tanning bed use? No    Are you easily burned? Yes    Excessive sun exposure? No    Blistering sunburns? Yes     Social Drivers of Health     Financial Resource Strain: Medium Risk (01/04/2023)    Received from [facility]    Overall Financial Resource Strain (CARDIA)     Difficulty of Paying Living Expenses: Somewhat hard   Food Insecurity: No Food Insecurity (01/04/2023)    Received from [facility]    Hunger Vital Sign     Within the past 12 months, you worried that your food would run out before you got the money to buy more.: Never true     Within the past 12 months, the food you bought just didn't last and you didn't have money to get more.: Never true   Transportation Needs: No Transportation Needs (01/04/2023)    Received from [facility] - Transportation     Lack of Transportation (Medical): No     Lack of Transportation (Non-Medical): No   Physical Activity: Insufficiently Active (01/04/2023)    Received from [facility]    Exercise Vital Sign     On average, how many days per week do you engage in moderate to strenuous exercise (like a brisk walk)?: 1 day     On average, how many minutes do you engage in exercise at this level?: 10 min   Stress: No Stress Concern Present (01/04/2023)    Received from [facility] of Occupational Health - Occupational Stress Questionnaire     Feeling of Stress : Only a little   Social Connections: Moderately Integrated (01/04/2023)    Received from [facility]    Social Connection and Isolation Panel     In a typical week, how many times do you talk on the phone with family, friends, or neighbors?:  More than three times a week     How often do you get together with friends or relatives?: Twice a week     How often do you attend church or religious services?: More than 4 times per year     Do you belong to any clubs or organizations such as church groups, unions, fraternal or athletic groups, or school groups?: Yes     How often do you attend meetings of the clubs or organizations you belong to?: 1 to 4 times per year     Are you married, widowed, divorced, separated, never married, or living with a partner?: Separated   Housing: Low Risk  (09/30/2022)    Housing     Within the past 12 months, have you ever stayed: outside, in a car, in a tent, in an overnight shelter, or temporarily in someone else's home (i.e. couch-surfing)?: No     Are you worried about losing your housing?: No        Family History  No DM in family    Social Hx:  On disability  Smoke: none  EtOH: none       Review of Systems  A 10 systems reviewed and were negative except for pertinent items noted in the HPI and below:    Eyes: denies blurred vision; last dilated eye exam showed no diabetic retinopathy  CVS: denies CP   PULM:  denies SOB     Lab Review    DIABETES MELLITUS RESULTS:    HGB A1C, POC (%)   Date Value   03/29/2023 6.4     Hemoglobin A1C (%)   Date Value   09/15/2022 7.4 (H)   04/06/2022 7.4 (H)   09/18/2019 6.6 (H)     Glucose, POC (mg/dL)   Date Value   97/96/7974 184 (H)   11/09/2022 196 (H)   11/04/2022 278 (H)         Lab Results   Component Value Date    CREATININE 1.38 (H) 07/05/2023     Lab Results   Component Value Date    CHOL 83 03/29/2023     Lab Results   Component Value Date    HDL 31 (L) 03/29/2023     No components found for: LDLCALC, DIRECTLDL  Lab Results   Component Value Date    TRIG 195 (H) 03/29/2023     Lab Results   Component Value Date    TSH 1.440 06/19/2019     Creatinine (mg/dL)   Date Value   94/87/7974 1.38 (H)   06/11/2023 1.40 (H)   03/29/2023 1.21 (H)   01/01/2023 1.09

## 2023-09-14 DIAGNOSIS — G4733 Obstructive sleep apnea (adult) (pediatric): Secondary | ICD-10-CM | POA: Diagnosis not present

## 2023-09-14 MED ORDER — TRADJENTA 5 MG TABLET
ORAL_TABLET | Freq: Every day | ORAL | 2 refills | 90.00000 days | Status: CP
Start: 2023-09-14 — End: ?

## 2023-09-20 ENCOUNTER — Other Ambulatory Visit: Payer: Self-pay | Admitting: Family Medicine

## 2023-09-20 DIAGNOSIS — Z944 Liver transplant status: Principal | ICD-10-CM

## 2023-09-20 DIAGNOSIS — E612 Magnesium deficiency: Principal | ICD-10-CM

## 2023-09-20 DIAGNOSIS — Z5181 Encounter for therapeutic drug level monitoring: Principal | ICD-10-CM

## 2023-09-20 DIAGNOSIS — Z125 Encounter for screening for malignant neoplasm of prostate: Secondary | ICD-10-CM

## 2023-09-20 DIAGNOSIS — E119 Type 2 diabetes mellitus without complications: Secondary | ICD-10-CM

## 2023-09-21 ENCOUNTER — Other Ambulatory Visit (INDEPENDENT_AMBULATORY_CARE_PROVIDER_SITE_OTHER)

## 2023-09-21 DIAGNOSIS — E119 Type 2 diabetes mellitus without complications: Secondary | ICD-10-CM | POA: Diagnosis not present

## 2023-09-21 DIAGNOSIS — Z125 Encounter for screening for malignant neoplasm of prostate: Secondary | ICD-10-CM

## 2023-09-21 LAB — COMPREHENSIVE METABOLIC PANEL WITH GFR
ALT: 23 U/L (ref 0–53)
AST: 18 U/L (ref 0–37)
Albumin: 4.5 g/dL (ref 3.5–5.2)
Alkaline Phosphatase: 64 U/L (ref 39–117)
BUN: 22 mg/dL (ref 6–23)
CO2: 24 meq/L (ref 19–32)
Calcium: 9.1 mg/dL (ref 8.4–10.5)
Chloride: 104 meq/L (ref 96–112)
Creatinine, Ser: 1.21 mg/dL (ref 0.40–1.50)
GFR: 64.72 mL/min (ref >60.00)
Glucose, Bld: 152 mg/dL — ABNORMAL HIGH (ref 70–99)
Potassium: 4.7 meq/L (ref 3.5–5.1)
Sodium: 138 meq/L (ref 135–145)
Total Bilirubin: 0.3 mg/dL (ref 0.2–1.2)
Total Protein: 7.3 g/dL (ref 6.0–8.3)

## 2023-09-21 LAB — CBC WITH DIFFERENTIAL/PLATELET
Basophils Absolute: 0.1 K/uL (ref 0.0–0.1)
Basophils Relative: 0.8 % (ref 0.0–3.0)
Eosinophils Absolute: 0.2 K/uL (ref 0.0–0.7)
Eosinophils Relative: 2 % (ref 0.0–5.0)
HCT: 36.9 % — ABNORMAL LOW (ref 39.0–52.0)
Hemoglobin: 11.3 g/dL — ABNORMAL LOW (ref 13.0–17.0)
Lymphocytes Relative: 27.7 % (ref 12.0–46.0)
Lymphs Abs: 2.7 K/uL (ref 0.7–4.0)
MCHC: 30.6 g/dL (ref 30.0–36.0)
MCV: 73.2 fl — ABNORMAL LOW (ref 78.0–100.0)
Monocytes Absolute: 0.9 K/uL (ref 0.1–1.0)
Monocytes Relative: 9.5 % (ref 3.0–12.0)
Neutro Abs: 5.9 K/uL (ref 1.4–7.7)
Neutrophils Relative %: 60 % (ref 43.0–77.0)
Platelets: 284 K/uL (ref 150.0–400.0)
RBC: 5.04 Mil/uL (ref 4.22–5.81)
RDW: 21.5 % — ABNORMAL HIGH (ref 11.5–15.5)
WBC: 9.8 K/uL (ref 4.0–10.5)

## 2023-09-21 LAB — PSA, MEDICARE: PSA: 0.58 ng/mL (ref 0.10–4.00)

## 2023-09-21 LAB — TSH: TSH: 1.86 u[IU]/mL (ref 0.35–5.50)

## 2023-09-21 LAB — LIPID PANEL
Cholesterol: 121 mg/dL (ref 0–200)
HDL: 46.4 mg/dL (ref >39.00)
LDL Cholesterol: 49 mg/dL (ref 0–99)
NonHDL: 74.95
Total CHOL/HDL Ratio: 3
Triglycerides: 131 mg/dL (ref 0.0–149.0)
VLDL: 26.2 mg/dL (ref 0.0–40.0)

## 2023-09-21 LAB — HEMOGLOBIN A1C: Hgb A1c MFr Bld: 9.5 % — ABNORMAL HIGH (ref 4.6–6.5)

## 2023-09-21 MED ORDER — PREDNISONE 5 MG TABLET
ORAL_TABLET | 0 refills | 0.00000 days
Start: 2023-09-21 — End: ?

## 2023-09-22 ENCOUNTER — Ambulatory Visit: Payer: Self-pay | Admitting: Family Medicine

## 2023-09-24 DIAGNOSIS — E119 Type 2 diabetes mellitus without complications: Secondary | ICD-10-CM | POA: Diagnosis not present

## 2023-09-24 MED ORDER — PREDNISONE 5 MG TABLET
ORAL_TABLET | 0 refills | 0.00000 days
Start: 2023-09-24 — End: ?

## 2023-09-27 DIAGNOSIS — Z944 Liver transplant status: Principal | ICD-10-CM

## 2023-09-27 DIAGNOSIS — Z5181 Encounter for therapeutic drug level monitoring: Principal | ICD-10-CM

## 2023-09-27 DIAGNOSIS — Z8505 Personal history of malignant neoplasm of liver: Principal | ICD-10-CM

## 2023-09-27 DIAGNOSIS — E612 Magnesium deficiency: Principal | ICD-10-CM

## 2023-09-28 ENCOUNTER — Encounter: Admitting: Family Medicine

## 2023-09-28 ENCOUNTER — Ambulatory Visit: Admit: 2023-09-28 | Discharge: 2023-09-28 | Payer: Medicare (Managed Care)

## 2023-09-28 DIAGNOSIS — D84821 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-HCC): Principal | ICD-10-CM

## 2023-09-28 DIAGNOSIS — I10 Essential (primary) hypertension: Principal | ICD-10-CM

## 2023-09-28 DIAGNOSIS — D500 Iron deficiency anemia secondary to blood loss (chronic): Principal | ICD-10-CM

## 2023-09-28 DIAGNOSIS — Z79899 Other long term (current) drug therapy: Principal | ICD-10-CM

## 2023-09-28 DIAGNOSIS — Z944 Liver transplant status: Principal | ICD-10-CM

## 2023-09-28 DIAGNOSIS — Z23 Encounter for immunization: Secondary | ICD-10-CM | POA: Diagnosis not present

## 2023-09-28 LAB — COMPREHENSIVE METABOLIC PANEL
ALBUMIN: 4.2 g/dL (ref 3.4–5.0)
ALKALINE PHOSPHATASE: 97 U/L (ref 46–116)
ALT (SGPT): 25 U/L (ref 10–49)
ANION GAP: 10 mmol/L (ref 5–14)
AST (SGOT): 24 U/L (ref <=34)
BILIRUBIN TOTAL: <0.2 mg/dL — ABNORMAL LOW (ref 0.3–1.2)
BLOOD UREA NITROGEN: 21 mg/dL (ref 9–23)
BUN / CREAT RATIO: 18
CALCIUM: 9.3 mg/dL (ref 8.7–10.4)
CHLORIDE: 107 mmol/L (ref 98–107)
CO2: 20 mmol/L (ref 20.0–31.0)
CREATININE: 1.14 mg/dL (ref 0.73–1.18)
EGFR CKD-EPI (2021) MALE: 73 mL/min/1.73m2 (ref >=60)
GLUCOSE RANDOM: 309 mg/dL — ABNORMAL HIGH (ref 70–179)
POTASSIUM: 4.8 mmol/L (ref 3.4–4.8)
PROTEIN TOTAL: 8 g/dL (ref 5.7–8.2)
SODIUM: 137 mmol/L (ref 135–145)

## 2023-09-28 LAB — CBC W/ AUTO DIFF
BASOPHILS ABSOLUTE COUNT: 0.1 10*9/L (ref 0.0–0.1)
BASOPHILS RELATIVE PERCENT: 1.1 %
EOSINOPHILS ABSOLUTE COUNT: 0.3 10*9/L (ref 0.0–0.5)
EOSINOPHILS RELATIVE PERCENT: 2.6 %
HEMATOCRIT: 39.4 % (ref 39.0–48.0)
HEMOGLOBIN: 12.7 g/dL — ABNORMAL LOW (ref 12.9–16.5)
LYMPHOCYTES ABSOLUTE COUNT: 2.1 10*9/L (ref 1.1–3.6)
LYMPHOCYTES RELATIVE PERCENT: 21.8 %
MEAN CORPUSCULAR HEMOGLOBIN CONC: 32.4 g/dL (ref 32.0–36.0)
MEAN CORPUSCULAR HEMOGLOBIN: 23.9 pg — ABNORMAL LOW (ref 25.9–32.4)
MEAN CORPUSCULAR VOLUME: 73.8 fL — ABNORMAL LOW (ref 77.6–95.7)
MEAN PLATELET VOLUME: 7.6 fL (ref 6.8–10.7)
MONOCYTES ABSOLUTE COUNT: 0.8 10*9/L (ref 0.3–0.8)
MONOCYTES RELATIVE PERCENT: 7.8 %
NEUTROPHILS ABSOLUTE COUNT: 6.4 10*9/L (ref 1.8–7.8)
NEUTROPHILS RELATIVE PERCENT: 66.7 %
PLATELET COUNT: 274 10*9/L (ref 150–450)
RED BLOOD CELL COUNT: 5.34 10*12/L (ref 4.26–5.60)
RED CELL DISTRIBUTION WIDTH: 21.7 % — ABNORMAL HIGH (ref 12.2–15.2)
WBC ADJUSTED: 9.7 10*9/L (ref 3.6–11.2)

## 2023-09-28 LAB — CBC
HEMATOCRIT: 37.8 % — ABNORMAL LOW
HEMOGLOBIN: 10.5 g/dL — ABNORMAL LOW
MEAN CORPUSCULAR VOLUME: 78.8 fL — ABNORMAL LOW
PLATELET COUNT: 328 10*9/L
RED BLOOD CELL COUNT: 4.8 10*12/L
WBC ADJUSTED: 10.1 10*9/L

## 2023-09-28 LAB — TACROLIMUS LEVEL: TACROLIMUS BLOOD: 5.2 ng/mL

## 2023-09-28 LAB — GAMMA GT: GAMMA GLUTAMYL TRANSFERASE: 31 U/L (ref 0–73)

## 2023-09-28 LAB — IRON PANEL
IRON SATURATION: 5 % — ABNORMAL LOW (ref 20–55)
IRON: 18 ug/dL — ABNORMAL LOW (ref 65–175)
TOTAL IRON BINDING CAPACITY: 394 ug/dL (ref 250–425)

## 2023-09-28 LAB — HEPATIC FUNCTION PANEL
ALKALINE PHOSPHATASE: 69 U/L
ALT (SGPT): 22 U/L
AST (SGOT): 14 U/L

## 2023-09-28 LAB — FERRITIN: FERRITIN: 11.7 ng/mL (ref 10.5–307.3)

## 2023-09-28 LAB — AFP TUMOR MARKER: AFP-TUMOR MARKER: <2 ng/mL (ref <=8)

## 2023-09-28 LAB — BILIRUBIN, DIRECT: BILIRUBIN DIRECT: <0.1 mg/dL (ref 0.00–0.30)

## 2023-09-28 LAB — SLIDE REVIEW

## 2023-09-28 LAB — MAGNESIUM: MAGNESIUM: 1.8 mg/dL (ref 1.6–2.6)

## 2023-09-28 LAB — PHOSPHORUS: PHOSPHORUS: 4.1 mg/dL (ref 2.4–5.1)

## 2023-09-28 NOTE — Unmapped (Signed)
 Per provider, the patient received the Hepatitis B vaccine.  Patient ID verified with name and date of birth.  All screening questions were answered.  Vaccine(s) were administered as ordered.  See immunization history for documentation.  Patient tolerated the injection(s) well with no issues noted.  Vaccine Information sheet given to the patient.

## 2023-09-28 NOTE — Unmapped (Addendum)
 [facility]  [facility]  Donald KATHEE Matter, [place]   [facility]  [address]  [place], [place] [address]  Main Clinic: [phone]  Appointment Schedulers: [phone]  Fax: [phone]       Thank you for allowing me to participate in your medical care today. Here are my recommendations based on today's visit:    Continue tacrolimus  3 mg in the morning and 2 mg in the evening.  Continue post-transplant labs every 3 months.  Recommend starting iron  supplementation. You can take ferrous sulfate  325 mg (65 mg of elemental iron ), available over the counter (alternative formulations are also OK). This can cause dark stools. Take with Vitamin C or orange juice.  Schedule a yearly full body skin exam with dermatology given increased risk of skin cancer with immunosuppression.  Avoid NSAIDs (ibuprofen, naproxen, Advil, Motrin, Aleve, Naprosyn, etc). Acetaminophen  (Tylenol ) up to 2000 mg a day is ok  You received your Heplisav-B vaccine (Hepatitis B) vaccine today. Second dose due in 1 month.  The following vaccinations are recommended: annual influenza, updated COVID-19, Prevnar 21 (pneumococcus), RSV. These vaccines can be obtained in the [facility] and may also be obtained from your primary care provider's office or local pharmacy.    Take care,  Donald KATHEE Matter, PA

## 2023-09-28 NOTE — Unmapped (Signed)
 Labs DOS 09/10/23, full report available in media tab date 09/20/23 . Will enter in Epic results.

## 2023-09-28 NOTE — Unmapped (Addendum)
 [facility]  09/28/2023    Reason for visit: Status post liver transplantation on 09/16/2018 (Liver) (5 years) for Cirrhosis: Cryptogenic (Idiopathic), seen for follow up    Assessment/Plan:    63 y.o. male who underwent liver transplantion in 08/2018 for cryptogenic cirrhosis. Developed diarrhea and rectal bleeding, initially thought to be drug-induced colitis (mycophenolate ) but now more consistent with Crohn's. Started Skyrizi , following with IBD team. Has upcoming appointment with hematology for IDA. History of severe steatosis and mild rejection 09/2020. Currently normal liver biochemistries on tacrolimus  monotherapy. No known history of biliary complications.    Liver replaced by transplant     Decrease tacrolimus  to 2 mg twice daily, goal trough 3-5.  Repeat labs in 2 weeks, then in 1 month.  Remains off mycophenolate  given history of colitis.   Will continue tacrolimus  monotherapy given normal liver biochemistries. Defer addition of prednisone  as it will likely worsen T2DM and hepatic steatosis.    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-HCC)  Discussed need to schedule a yearly full body skin exam with dermatology given increased risk of skin cancer with immunosuppression.   First Heplisav-B vaccine (Hepatitis B) administered today. Second dose due in 1 month.   The following vaccinations were recommended: annual influenza, updated COVID-19, Prevnar 21, RSV. He deferred today.    Iron  deficiency anemia due to chronic blood loss  He has upcoming appointment with hematology and would benefit from iron  infusions.  In the interim, recommend starting iron  supplementation with ferrous sulfate  325 mg (65 mg of elemental iron ). Discussed that this can cause GI upset and cause dark stools. Take with Vitamin C or orange juice. Suspect he will tolerate it and may have benefit as CD is localized to colon (TI normal on most recent colonoscopy).    Hypertension  Monitor your blood pressure at home  Follow up with PCP next week    Return in about 1 year (around 09/27/2024).    Subjective   History of Present Illness   Accompanied by: N/A (unaccompanied)    63 y.o. male who underwent liver transplantion in 08/2018 for cryptogenic cirrhosis. History of severe steatosis and mild rejection 09/2020. No known history of biliary complications. Developed diarrhea and rectal bleeding, initially thought to be drug-induced colitis (mycophenolate ).    Interval History  Last seen by Verneita Gale, NP 10/30/2022. Has been following with IBD team, now though to have Crohn's disease. Completed 3 Skyrizi  infusions and is starting subcutaneous injections. Has upcoming appointment with hematology for IDA. Notes fatigue and joint pain. Also notes abdominal discomfort on LLQ and RLQ at times. Denies jaundice, fever, confusion, lower extremity edema, melena, or hematochezia. Notes recently worsening restless leg symptoms. Also feels light headed constantly. No syncope.    Objective   Physical Exam   Vital Signs: BP 136/97  - Pulse 74  - Temp 36.6 ??C (97.9 ??F) (Tympanic)  - Ht 172.7 cm (5' 7.99)  - Wt 89.4 kg (197 lb 1.6 oz)  - SpO2 100%  - BMI 29.98 kg/m??   Constitutional: He is in no apparent distress  Eyes: Anicteric sclerae  Cardiovascular: No peripheral edema  Gastrointestinal: Soft, nontender abdomen without hepatosplenomegaly, hernias, or masses  Neurologic: Awake, alert, and oriented to person, place, and time with normal speech      Lab Results   Component Value Date    WBC 9.7 09/28/2023    HGB 12.7 (L) 09/28/2023    HCT 39.4 09/28/2023    PLT 274 09/28/2023  Lab Results   Component Value Date    NA 137 09/28/2023    K 4.8 09/28/2023    CL 107 09/28/2023    CO2 20.0 09/28/2023    BUN 21 09/28/2023    CREATININE 1.14 09/28/2023    GLU 309 (H) 09/28/2023    CALCIUM 9.3 09/28/2023    MG 1.8 09/28/2023    PHOS 4.1 09/28/2023     Lab Results   Component Value Date    BILITOT <0.2 (L) 09/28/2023    PROT 8.0 09/28/2023    ALBUMIN 4.2 09/28/2023    ALT 25 09/28/2023    AST 24 09/28/2023    ALKPHOS 97 09/28/2023    GGT 31 09/28/2023     Patient is taking immunosuppressive medications due to liver transplantation and requires monitoring of renal function for signs of toxicity.    I personally spent 40 minutes face-to-face and non-face-to-face in the care of this patient, which includes all pre, intra, and post visit time on the date of service.

## 2023-09-29 DIAGNOSIS — Z944 Liver transplant status: Principal | ICD-10-CM

## 2023-09-29 MED ORDER — TACROLIMUS 1 MG CAPSULE, IMMEDIATE-RELEASE
ORAL_CAPSULE | Freq: Two times a day (BID) | ORAL | 3 refills | 90.00000 days | Status: CP
Start: 2023-09-29 — End: ?
  Filled 2023-10-11: qty 360, 90d supply, fill #0

## 2023-09-29 NOTE — Unmapped (Addendum)
 Per PA Minnie she has communicated with GI team who will contact patient to schedule iron  infusions.    New tac goal 3-5 thus ordered reduction in tac dosing from 3/2 to 2mg  BID - sent MyChart message to patient to relay this.      PA Minnie suggested repeat labs in 2 weeks, then monthly x 1 then back to every 3 months given h/o rejection to ensure stability.     Patient verbalized agreement.

## 2023-09-30 NOTE — Unmapped (Signed)
 Patient seen for annual post liver transplant visit by PA [facility]  Overall he reports doing well on disability but stays active working in yard and on cars.      He is followed by local PCP Cleatus, obtains routine labs @ [facility] and fills his tacrolimus  vis [place] [facility] tac goal 3-5  No longer on myfortic  as this was thought to contribute to some GI symptoms.    Follows with [facility] for [place] - now on skyrizzi - has completed infusions and about to begin injections.  PA Minnie confirmed ok to use OTC iron  - she also spoke with GI about securing iron  infusions.    Patient denies N/V/D/F and denies alcohol, tobacco or drug use.      He had has last AFP for hcc surveillance completed today (5 years post txp retreat score 1).      Due for appt with dermatology for skin surveillence confims he will secure this locally.    BP elevated today 155/95 HR 74 - advised to check and log BP @ home this week and report to PCP next week if it remains elevated - he was agreeable.  Continues follow-up with cardiology.    Colonoscopy due in fall of 2025 (managed by [facility] IBD team).    Obtained Heplisav B vaccination today (due for second in 1 month, provided script for local admin and discussed can return to [facility] if unable to secure locally).  Suggested PCV 21, RSV and COVID vaccines as well - he will consider these locally.      5 minutes of education spent on post transplant care topics.

## 2023-09-30 NOTE — Unmapped (Signed)
 Addended by: BEATHER NORRIS on: 09/30/2023 04:41 PM     Modules accepted: Orders

## 2023-10-04 NOTE — Unmapped (Addendum)
 [facility] Pharmacist has reviewed a new prescription for tacrolimus  that indicates a dose decrease.  Patient was counseled on this dosage change by coordinator EL- see epic note from 8/6.  Next refill call date adjusted if necessary.        Clinical Assessment Needed For: Dose Change  Medication: TACROLIMUS  1 MG   Last Fill Date/Day Supply: 09/09/2023 / 30  Copay $0.00/ 90   Was previous dose already scheduled to fill: No    Notes to Pharmacist: n/a

## 2023-10-05 ENCOUNTER — Ambulatory Visit: Admitting: Family Medicine

## 2023-10-05 ENCOUNTER — Encounter: Payer: Self-pay | Admitting: Family Medicine

## 2023-10-05 VITALS — BP 122/66 | HR 69 | Temp 98.4°F | Ht 69.29 in | Wt 201.0 lb

## 2023-10-05 DIAGNOSIS — E039 Hypothyroidism, unspecified: Secondary | ICD-10-CM | POA: Diagnosis not present

## 2023-10-05 DIAGNOSIS — F419 Anxiety disorder, unspecified: Secondary | ICD-10-CM | POA: Diagnosis not present

## 2023-10-05 DIAGNOSIS — Z0000 Encounter for general adult medical examination without abnormal findings: Secondary | ICD-10-CM

## 2023-10-05 DIAGNOSIS — E119 Type 2 diabetes mellitus without complications: Secondary | ICD-10-CM | POA: Diagnosis not present

## 2023-10-05 DIAGNOSIS — Z7984 Long term (current) use of oral hypoglycemic drugs: Secondary | ICD-10-CM

## 2023-10-05 DIAGNOSIS — Z7189 Other specified counseling: Secondary | ICD-10-CM

## 2023-10-05 DIAGNOSIS — K5010 Crohn's disease of large intestine without complications: Secondary | ICD-10-CM | POA: Diagnosis not present

## 2023-10-05 DIAGNOSIS — E785 Hyperlipidemia, unspecified: Secondary | ICD-10-CM

## 2023-10-05 MED ORDER — INSULIN NPH (HUMAN) (ISOPHANE) 100 UNIT/ML ~~LOC~~ SUSP: SUBCUTANEOUS | Status: AC

## 2023-10-05 MED ORDER — IRON (FERROUS SULFATE) 325 (65 FE) MG PO TABS
325.0000 mg | ORAL_TABLET | Freq: Every day | ORAL | Status: AC
Start: 2023-10-05 — End: ?

## 2023-10-05 MED ORDER — TACROLIMUS 1 MG PO CAPS: 2.0000 mg | ORAL_CAPSULE | Freq: Two times a day (BID) | ORAL | Status: AC

## 2023-10-05 MED ORDER — INSULIN LISPRO (1 UNIT DIAL) 100 UNIT/ML (KWIKPEN): PEN_INJECTOR | SUBCUTANEOUS | Status: AC

## 2023-10-05 NOTE — Progress Notes (Signed)
 RLS sx.   He has iron  infusion pending.  RLS sx can be worse with iron  def, d/w pt.  He can occ feel off balance w/o presyncope.  No syncope.    D/w pt that we can send A1c and labs to [facility] [place]. Done with prednisone .  On insulin  per endo.    Med list updated, with him taking NPH 14 QAM, Humalog  8 for breakfast, 10 units for lunch, 10 units with dinner  Sugar recently improved 130-160s.  No numbness in the feet.     Previously diagnosed with Crohn's.  Discussed with patient.  Had seen GI. Sx improved on skyrizi .    Hypothyroidism on levothyroxine .  Compliant.  TSH wnl.   Elevated Cholesterol: Using medications without problems: yes Muscle aches: some aches but could be from source other than statin.   Diet compliance: d/w pt.  Exercise: d/w pt.   Still on sertraline .  He didn't improve with taper.  He restarted and anxiety improved on med.    Tetanus 2021 Flu shot yearly.   Shingles prev done PNA prev done Covid vaccine prev done.   FH prostate cancer- PSA wnl FH colon cancer.  Colonoscopy 05/10/2023  Living will d/w pt.  Wife and his daughter Laymon would be designated equally.   HCV and HIV screening prev done.  D/w pt about getting DXA done.   He asked about plan for cardiac calcium scoring per [facility].  I need input from Dr. Okey.  Prev cath results d/w pt. see following phone note.  Prev cath with    Prox RCA lesion is 20% stenosed.   Mid RCA to Dist RCA lesion is 20% stenosed.   RPDA lesion is 20% stenosed.   Mid LAD lesion is 80% stenosed.  Meds, vitals, and allergies reviewed.   ROS: Per HPI unless specifically indicated in ROS section   GEN: nad, alert and oriented HEENT: mucous membranes moist NECK: supple w/o LA CV: rrr.  PULM: ctab, no inc wob ABD: soft, +bs, scar well-healed, chronic paresthesias in the lower abdominal wall at baseline EXT: no edema SKIN: no acute rash  Diabetic foot exam: Normal  inspection No skin breakdown No calluses  Normal DP pulses Normal sensation to light touch and monofilament Nails normal

## 2023-10-05 NOTE — Patient Instructions (Addendum)
 Please update me about your skyrizi  dose.  Take care.  Glad to see you. I'll update endocrine.    You can call for a bone density test at [facility] at [facility].  [address]  [phone]  I would get a flu shot each fall.

## 2023-10-06 ENCOUNTER — Telehealth: Payer: Self-pay | Admitting: Family Medicine

## 2023-10-06 DIAGNOSIS — F419 Anxiety disorder, unspecified: Secondary | ICD-10-CM | POA: Insufficient documentation

## 2023-10-06 NOTE — Assessment & Plan Note (Signed)
Continue pravastatin.  Continue work on diet and exercise.

## 2023-10-06 NOTE — Telephone Encounter (Signed)
 Dr. Okey,   I need your input on two issues.   He asked about plan for possible cardiac calcium scoring per [facility].  I do not know if this is likely to change the plan, given his previous cath results.  Did he talk to you about this/what advice can you give?  When do want to see this patient again?  Thanks.

## 2023-10-06 NOTE — Assessment & Plan Note (Signed)
 We can update the [facility] clinic. Med list updated, with him taking NPH 14 QAM, Humalog  8 for breakfast, 10 units for lunch, 10 units with dinner  Sugar recently improved 130-160s.  No numbness in the feet.    No change in medications at this point.

## 2023-10-06 NOTE — Assessment & Plan Note (Signed)
 Symptoms improved with Skyrizi .  Per GI.  I will defer.

## 2023-10-06 NOTE — Assessment & Plan Note (Signed)
 Still on sertraline .  He didn't improve with taper.  He restarted and anxiety improved on med.   Would continue as is.

## 2023-10-06 NOTE — Assessment & Plan Note (Signed)
TSH normal.  Continue levothyroxine as is. 

## 2023-10-06 NOTE — Assessment & Plan Note (Signed)
 Tetanus 2021 Flu shot yearly.   Shingles prev done PNA prev done Covid vaccine prev done.   FH prostate cancer- PSA wnl FH colon cancer.  Colonoscopy 05/10/2023  Living will d/w pt.  Wife and his daughter Laymon would be designated equally.   HCV and HIV screening prev done.

## 2023-10-07 ENCOUNTER — Other Ambulatory Visit: Payer: Self-pay | Admitting: Family Medicine

## 2023-10-07 ENCOUNTER — Encounter: Payer: Self-pay | Admitting: Family Medicine

## 2023-10-07 DIAGNOSIS — M8580 Other specified disorders of bone density and structure, unspecified site: Secondary | ICD-10-CM

## 2023-10-07 MED ORDER — METOPROLOL TARTRATE 25 MG TABLET
ORAL_TABLET | Freq: Two times a day (BID) | ORAL | 1 refills | 90.00000 days
Start: 2023-10-07 — End: ?

## 2023-10-07 NOTE — Assessment & Plan Note (Signed)
 Living will d/w pt.  Wife and his daughter Laymon would be designated equally.

## 2023-10-07 NOTE — Telephone Encounter (Signed)
 Can someone check on the status of this order for bone density. Thank you

## 2023-10-07 NOTE — Unmapped (Signed)
 [facility] Delivery Pharm[facility] Refill Coordination Note    Specialty Medication(s) to be Shipped:   Transplant: tacrolimus  1mg     Other medication(s) to be shipped: Freestyle Libre 3 sensors, pravastatin , metoprolol     Specialty Medications not needed at this time: Inflammatory Disorders: Skyrizi      Anthony Alvarado, DOB: [birth year]  Phone: [phone] (home)       All above HIPAA information was verified with patient.     Was a Nurse, learning disability used for this call? No    Completed refill call assessment today to schedule patient's medication shipment from the [facility] and Home Delivery Pharmacy  [phone]).  All relevant notes have been reviewed.     Specialty medication(s) and dose(s) confirmed: Regimen is correct and unchanged.   Changes to medications: Anthony Alvarado reports no changes at this time.  Changes to insurance: No  New side effects reported not previously addressed with a pharmacist or physician: None reported  Questions for the pharmacist: No    Confirmed patient received a Conservation officer, historic buildings and a Surveyor, mining with first shipment. The patient will receive a drug information handout for each medication shipped and additional FDA Medication Guides as required.       DISEASE/MEDICATION-SPECIFIC INFORMATION        N/A    SPECIALTY MEDICATION ADHERENCE     Medication Adherence    Patient reported X missed doses in the last month: 0  Specialty Medication: tacrolimus  1 MG capsule (PROGRAF )  Patient is on additional specialty medications: No  Patient is on more than two specialty medications: No  Any gaps in refill history greater than 2 weeks in the last 3 months: no  Demonstrates understanding of importance of adherence: yes  Informant: patient  Confirmed plan for next specialty medication refill: delivery by pharmacy  Refills needed for supportive medications: not needed          Refill Coordination    Has the Patients' Contact Information Changed: No  Is the Shipping Address Different: No         Were doses missed due to medication being on hold? No    Tacrolimus  1  mg: 4 days of medicine on hand       REFERRAL TO PHARMACIST     Referral to the pharmacist: Not needed      [facility]     Shipping address confirmed in Epic.     Cost and Payment: Patient has a copay of $0. They are aware and have authorized the pharmacy to charge the credit card on file.    Delivery Scheduled: Yes, Expected medication delivery date: 10/12/23.     Medication will be delivered via UPS to the prescription address in Epic WAM.    Anthony Alvarado   [facility]  Specialty Technician

## 2023-10-11 ENCOUNTER — Other Ambulatory Visit: Payer: Self-pay | Admitting: Family Medicine

## 2023-10-11 DIAGNOSIS — Z79899 Other long term (current) drug therapy: Principal | ICD-10-CM

## 2023-10-11 DIAGNOSIS — Z944 Liver transplant status: Principal | ICD-10-CM

## 2023-10-11 MED FILL — METOPROLOL TARTRATE 25 MG TABLET: ORAL | 90 days supply | Qty: 180 | Fill #0

## 2023-10-11 MED FILL — PRAVASTATIN 40 MG TABLET: ORAL | 30 days supply | Qty: 30 | Fill #2

## 2023-10-11 MED FILL — FREESTYLE LIBRE 3 PLUS SENSOR DEVICE: ORAL | 84 days supply | Qty: 6 | Fill #7

## 2023-10-11 NOTE — Telephone Encounter (Signed)
 Appreciate input from Dr. Okey.    AV- please see below and update patient.  Thanks.

## 2023-10-11 NOTE — Telephone Encounter (Signed)
 I apologize for the late reply  Re Ca score  [name] has known CAD, has had a L heart cath.   Ca score would not add anything   I will set to see him this fall

## 2023-10-12 DIAGNOSIS — K50111 Crohn's disease of large intestine with rectal bleeding: Secondary | ICD-10-CM | POA: Diagnosis not present

## 2023-10-12 DIAGNOSIS — D509 Iron deficiency anemia, unspecified: Secondary | ICD-10-CM | POA: Diagnosis not present

## 2023-10-12 NOTE — Telephone Encounter (Signed)
 Spoke to pt relayed message below per Dr Cleatus pt verbalized understanding

## 2023-10-15 DIAGNOSIS — G4733 Obstructive sleep apnea (adult) (pediatric): Secondary | ICD-10-CM | POA: Diagnosis not present

## 2023-10-19 NOTE — Unmapped (Signed)
 Left message to call back.

## 2023-10-25 DIAGNOSIS — Z79899 Other long term (current) drug therapy: Principal | ICD-10-CM

## 2023-10-25 DIAGNOSIS — Z944 Liver transplant status: Principal | ICD-10-CM

## 2023-10-25 DIAGNOSIS — E119 Type 2 diabetes mellitus without complications: Secondary | ICD-10-CM | POA: Diagnosis not present

## 2023-11-01 DIAGNOSIS — Z79899 Other long term (current) drug therapy: Secondary | ICD-10-CM | POA: Diagnosis not present

## 2023-11-01 DIAGNOSIS — Z944 Liver transplant status: Secondary | ICD-10-CM | POA: Diagnosis not present

## 2023-11-02 LAB — CBC W/ DIFFERENTIAL
BANDED NEUTROPHILS ABSOLUTE COUNT: 0.1 x10E3/uL (ref 0.0–0.1)
BASOPHILS ABSOLUTE COUNT: 0.1 x10E3/uL (ref 0.0–0.2)
BASOPHILS RELATIVE PERCENT: 1 %
EOSINOPHILS ABSOLUTE COUNT: 0.7 x10E3/uL — ABNORMAL HIGH (ref 0.0–0.4)
EOSINOPHILS RELATIVE PERCENT: 7 %
HEMATOCRIT: 43.8 % (ref 37.5–51.0)
HEMOGLOBIN: 13.4 g/dL (ref 13.0–17.7)
IMMATURE GRANULOCYTES: 1 %
LYMPHOCYTES ABSOLUTE COUNT: 2.2 x10E3/uL (ref 0.7–3.1)
LYMPHOCYTES RELATIVE PERCENT: 24 %
MEAN CORPUSCULAR HEMOGLOBIN CONC: 30.6 g/dL — ABNORMAL LOW (ref 31.5–35.7)
MEAN CORPUSCULAR HEMOGLOBIN: 25.7 pg — ABNORMAL LOW (ref 26.6–33.0)
MEAN CORPUSCULAR VOLUME: 84 fL (ref 79–97)
MONOCYTES ABSOLUTE COUNT: 0.8 x10E3/uL (ref 0.1–0.9)
MONOCYTES RELATIVE PERCENT: 8 %
NEUTROPHILS ABSOLUTE COUNT: 5.6 x10E3/uL (ref 1.4–7.0)
NEUTROPHILS RELATIVE PERCENT: 59 %
PLATELET COUNT: 228 x10E3/uL (ref 150–450)
RED BLOOD CELL COUNT: 5.22 x10E6/uL (ref 4.14–5.80)
RED CELL DISTRIBUTION WIDTH: 21.1 % — ABNORMAL HIGH (ref 11.6–15.4)
WHITE BLOOD CELL COUNT: 9.4 x10E3/uL (ref 3.4–10.8)

## 2023-11-02 LAB — COMPREHENSIVE METABOLIC PANEL
ALBUMIN: 4.6 g/dL (ref 3.9–4.9)
ALKALINE PHOSPHATASE: 101 IU/L (ref 44–121)
ALT (SGPT): 23 IU/L (ref 0–44)
AST (SGOT): 23 IU/L (ref 0–40)
BILIRUBIN TOTAL (MG/DL) IN SER/PLAS: 0.3 mg/dL (ref 0.0–1.2)
BLOOD UREA NITROGEN: 21 mg/dL (ref 8–27)
BUN / CREAT RATIO: 15 (ref 10–24)
CALCIUM: 9.8 mg/dL (ref 8.6–10.2)
CHLORIDE: 104 mmol/L (ref 96–106)
CO2: 18 mmol/L — ABNORMAL LOW (ref 20–29)
CREATININE: 1.39 mg/dL — ABNORMAL HIGH (ref 0.76–1.27)
GLOBULIN, TOTAL: 2.8 g/dL (ref 1.5–4.5)
GLUCOSE: 148 mg/dL — ABNORMAL HIGH (ref 70–99)
POTASSIUM: 4.8 mmol/L (ref 3.5–5.2)
SODIUM: 136 mmol/L (ref 134–144)
TOTAL PROTEIN: 7.4 g/dL (ref 6.0–8.5)

## 2023-11-02 LAB — PHOSPHORUS: PHOSPHORUS, SERUM: 3.9 mg/dL (ref 2.8–4.1)

## 2023-11-02 LAB — MAGNESIUM: MAGNESIUM: 1.6 mg/dL (ref 1.6–2.3)

## 2023-11-02 LAB — GAMMA GT: GAMMA GLUTAMYL TRANSFERASE: 34 IU/L (ref 0–65)

## 2023-11-02 LAB — BILIRUBIN, DIRECT: BILIRUBIN DIRECT: 0.08 mg/dL (ref 0.00–0.40)

## 2023-11-03 DIAGNOSIS — Z944 Liver transplant status: Principal | ICD-10-CM

## 2023-11-03 DIAGNOSIS — K644 Residual hemorrhoidal skin tags: Secondary | ICD-10-CM | POA: Diagnosis not present

## 2023-11-03 LAB — TACROLIMUS LEVEL: TACROLIMUS BLOOD: 7.2 ng/mL (ref 5.0–20.0)

## 2023-11-03 NOTE — Unmapped (Addendum)
 9/10 - Call placed to patient as noted tac level above goal of 3-5 - sought clarification if he is taking 2mg  BID (dose reduced on 8/6) and asked if he took med prior to lab draw - left vm requesting return call.    9/12 - Patient returned call confirms he is taking 2mg  BID and did not take meds prior to lab draw - advised to repeat again in 2 weeks, he verbalized agreement.

## 2023-11-04 NOTE — Unmapped (Signed)
 Improved with medical treatment  [name] hemorrhoid compound as needed  Supplement diet with Water (64 oz) & Fiber (30g) daily  Avoid constipation & Straining  Follow up as needed

## 2023-11-04 NOTE — Unmapped (Signed)
 Anthony Alvarado  63 y.o. 06-23-1960  Phone: [phone] (home)   Address: [address]  [facility] [place] [address]   MRN: [id number]  Primary MD : Arlyss Loreli Solian   [facility]     Problem List Items Addressed This Visit          Digestive    Hemorrhoids, external - Primary    Improved with medical treatment  [name] hemorrhoid compound as needed  Supplement diet with Water  (64 oz) & Fiber (30g) daily  Avoid constipation & Straining  Follow up as needed                   Hemorrhoid Consultation Note    Past Medical History:   Diagnosis Date    Acute blood loss anemia 09/21/2022    Aortic valve stenosis     Autoimmune cholangitis (CMS-HCC)     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    Cholestatic cirrhosis    (CMS-HCC)     Community acquired pneumonia 11/14/2022    GERD (gastroesophageal reflux disease)     Heart murmur 1997    BICUSPID AORTIC VALVE    Hepatic encephalopathy    (CMS-HCC)     Hyperlipidemia     Hypertension     Left hand pain 03/19/2021    Liver cancer    (CMS-HCC) it was on my old liver before transplant    Mild intermittent asthma with exacerbation (HHS-HCC) 04/29/2022    Other osteoporosis without current pathological fracture     Paresthesia 01/06/2023    Pneumonia 10/2022    Rash 09/25/2009    Qualifier: Diagnosis of   By: Solian MD, Arlyss      Severe aortic stenosis 05/11/2023    Sleep apnea     Type 2 diabetes mellitus    (CMS-HCC)      Patient Active Problem List   Diagnosis    Diabetes mellitus without complication    (CMS-HCC)    Hypothyroidism    Cirrhosis of liver without ascites    (CMS-HCC)    OSA (obstructive sleep apnea)    Essential hypertension    Encounter for pre-transplant evaluation for chronic liver disease    Pleural effusion    Pneumothorax after biopsy    Liver replaced by transplant    (CMS-HCC)    Allergic rhinitis    Arthralgia    Asthma (HHS-HCC)    Bicuspid aortic valve    Flying phobia    Mucosal abnormality of stomach    Nephrolithiasis    Pure hypercholesterolemia    Umbilical hernia    Unspecified hearing loss    COVID-19    Colitis    Hemorrhoids, external    Steatosis of liver    Steroid-induced hyperglycemia    Leukocytosis    Hyponatremia with increased serum osmolality    Coronary artery disease involving native coronary artery of native heart without angina pectoris    Osteopenia    S/P AVR (aortic valve replacement)    Crohn's disease of large intestine with rectal bleeding       Dysplasia of colon    Type 2 diabetes mellitus with hyperglycemia, with long-term current use of insulin     (CMS-HCC)    Iron  deficiency anemia due to chronic blood loss    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-HCC)     Past Surgical History:   Procedure Laterality Date    APPENDECTOMY  CARDIOTHORACIC PROCEDURE  2/23    CHG US  GUIDE, TISSUE ABLATION N/A 05/03/2018    Procedure: ULTRASOUND GUIDANCE FOR, AND MONITORING OF, PARENCHYMAL TISSUE ABLATION;  Surgeon: Alm Dasie Police, MD;  Location: MAIN OR [facility];  Service: Transplant    CORONARY ARTERY BYPASS GRAFT  04/03/2021    LIVER TRANSPLANTATION  09/16/2018    OTHER SURGICAL HISTORY      apendex,liver transplant    PR CATH PLACE/CORON ANGIO, IMG SUPER/INTERP,R&L HRT CATH, L HRT VENTRIC N/A 03/21/2018    Procedure: Left/Right Heart Catheterization;  Surgeon: Reyes Jerilynn Sage, MD;  Location: [facility] CATH;  Service: Cardiology    PR COLONOSCOPY W/BIOPSY SINGLE/MULTIPLE N/A 09/09/2022    Procedure: COLONOSCOPY, FLEXIBLE, PROXIMAL TO SPLENIC FLEXURE; WITH BIOPSY, SINGLE OR MULTIPLE;  Surgeon: Rhoderick Burnard Norris, MD;  Location: [facility]URES [facility];  Service: Gastroenterology    PR COLONOSCOPY W/BIOPSY SINGLE/MULTIPLE N/A 05/10/2023    Procedure: COLONOSCOPY, FLEXIBLE, PROXIMAL TO SPLENIC FLEXURE; WITH BIOPSY, SINGLE OR MULTIPLE;  Surgeon: Lucian Mikle Pfeiffer, MD;  Location: GI PROCEDURES [facility];  Service: Gastroenterology    PR DECORTICATION,PULMONARY,TOTAL Right 01/13/2019    Procedure: Decortic Pulm (Separt Proc); Tot;  Surgeon: Morene Nadara Diamond, MD;  Location: MAIN OR [facility];  Service: Thoracic    PR EGD FLEXIBLE FOREIGN BODY REMOVAL N/A 12/26/2018    Procedure: UGI ENDOSCOPY; W/REMOVAL FOREIGN BODY;  Surgeon: Krystal Jona Matter, MD;  Location: [facility] PROCEDURES [facility];  Service: Gastroenterology    PR LAP,ABLAT 1+ LIVER TUMOR(S),RADIOFREQ N/A 05/03/2018    Procedure: LAPAROSCOPY, SURGICAL, ABLATION OF 1 OR MORE LIVER TUMOR(S); RADIOFREQUENCY;  Surgeon: Alm Dasie Police, MD;  Location: MAIN OR [place];  Service: Transplant    PR PERQ DRAINAGE PLEURA INSERT CATH W/IMAGING N/A 01/11/2019    Procedure: PLEURAL DRAINAGE, PERC, W INSERTION OF INDWELLING CATHETER; W IMAGING GUIDANCE;  Surgeon: Selinda Janina Sermon, MD;  Location: BRONCH PROCEDURE LAB [facility];  Service: Pulmonary    PR SIGMOIDOSCOPY FLX DX W/COLLJ SPEC BR/WA IF PFRMD N/A 10/02/2022    Procedure: SIGMOIDOSCOPY, FLEXIBLE; DIAGNOSTIC, WITH OR WITHOUT COLLECTION OF SPECIMEN(S) BY BRUSHING OR WASHING;  Surgeon: Gareld Prentice Priestly, DO;  Location: [facility] PROC[facility]URES [facility];  Service: Gastroenterology    PR THORACENTESIS NEEDLE/CATH PLEURA W/IMAGING N/A 01/06/2019    Procedure: THORACENTESIS W/ IMAGING;  Surgeon: Missie Queen, MD;  Location: BRONCH PROCEDURE LAB [facility];  Service: Pulmonary    PR THORACOSCOPY SURG W/PLEURODESIS Right 01/13/2019    Procedure: THORACOSCOPY, SURGICAL; WITH PLEURODESIS (EG, MECHANICAL OR CHEMICAL);  Surgeon: Morene Nadara Diamond, MD;  Location: MAIN OR [facility];  Service: Thoracic    PR TRANSPLANT LIVER,ALLOTRANSPLANT Bilateral 09/16/2018    Procedure: LIVER ALLOTRANSPLANTATION; ORTHOTOPIC, PARTIAL OR WHOLE, FROM CADAVER OR LIVING DONOR, ANY AGE;  Surgeon: Leanna Cristi Gore, MD;  Location: MAIN OR [place];  Service: Transplant    PR TRANSPLANT,PREP DONOR LIVER, WHOLE N/A 09/16/2018    Procedure: BACKBNCH STD PREP CAD DONOR WHOLE LIVER GFT PRIOR TNSPLNT,INC CHOLE,DISS/REM SURR TISSU WO TRISEG/LOBE SPLT;  Surgeon: Leanna Cristi Gore, MD;  Location: MAIN OR [place];  Service: Transplant    SKIN BIOPSY         Allergies   Allergen Reactions    Mycophenolate  Mofetil Diarrhea     Mycophenolate  colitis     Watermelon Flavor      Mouth itching         Current Outpatient Medications:     aspirin  325 MG tablet, Take 1 tablet (325 mg total) by mouth daily., Disp: , Rfl:  azelastine (ASTELIN) 137 mcg (0.1 %) nasal spray, 1 spray into each nostril daily as needed., Disp: , Rfl:     blood sugar diagnostic (ONETOUCH ULTRA TEST) Strp, USE TO CHECK SUGAR DAILY, Disp: , Rfl:     blood-glucose sensor (FREESTYLE LIBRE 3 SENSOR) Devi, For use as directed. change every 14 days., Disp: 3 each, Rfl: 11    calcium carbonate (TUMS) 200 mg calcium (500 mg) chewable tablet, Chew 1 tablet (500 mg total) daily. Pt reports taking every other day. (Patient taking differently: Chew 1 tablet (500 mg total) as needed.), Disp: , Rfl:     cholecalciferol , vitamin D3, (VITAMIN D3 ORAL), Take by mouth., Disp: , Rfl:     fluticasone propionate (FLONASE) 50 mcg/actuation nasal spray, PLACE ONE OR TWO SPRAYS INTO BOTH NOSTRILS DAILY AS NEEDED., Disp: , Rfl:     glucose 4 GM chewable tablet, Chew 4 tablets (16 g total) every ten (10) minutes as needed for low blood sugar ((For Blood Glucose LESS than 70 mg/dL and GREATER than or EQUAL to 54 mg/dL and able to take by mouth.))., Disp: 50 tablet, Rfl: 12    hydrocortisone  1 % crpe, , Disp: , Rfl:     insulin  lispro (HUMALOG  KWIKPEN INSULIN ) 100 unit/mL injection pen, Inject up to 25 units under the skin three times daily before meals (75 units per day)., Disp: 90 mL, Rfl: 3    insulin  NPH isoph U-100 human (HUMULIN  N NPH INSULIN  KWIKPEN) 100 unit/mL (3 mL) injection pen, Inject up to 25 units under the skin once daily as directed., Disp: 30 mL, Rfl: 3    JARDIANCE  25 mg tablet, Take one tablet by mouth daily., Disp: 90 tablet, Rfl: 1 levothyroxine  (SYNTHROID ) 175 MCG tablet, Take 1 tablet (175 mcg total) by mouth daily before breakfast., Disp: 90 tablet, Rfl: 2    metoPROLOL  tartrate (LOPRESSOR ) 25 MG tablet, Take 1 tablet (25 mg total) by mouth two (2) times a day., Disp: 180 tablet, Rfl: 1    pen needle, diabetic 32 gauge x 5/32 (4 mm) Ndle, Use with insulin  up to 4 times/day as needed., Disp: 100 each, Rfl: 1    pravastatin  (PRAVACHOL ) 40 MG tablet, Take 1 tablet (40 mg total) by mouth every evening., Disp: 30 tablet, Rfl: 2    risankizumab -rzaa (SKYRIZI ) 360 mg/2.4 mL (150 mg/mL) Injt, Inject the contents of 1 cartridge (360mg ) under the skin every 8 weeks, Disp: 2.4 mL, Rfl: 2    sertraline  (ZOLOFT ) 50 MG tablet, Take 1 tablet (50 mg total) by mouth daily., Disp: 90 tablet, Rfl: 0    tacrolimus  (PROGRAF ) 1 MG capsule, Take 2 capsules (2 mg total) by mouth two (2) times a day., Disp: 360 capsule, Rfl: 3    TRADJENTA  5 mg Tab, TAKE 1 TABLET BY MOUTH DAILY, Disp: 90 tablet, Rfl: 2     reports that he has never smoked. He has never used smokeless tobacco. He reports that he does not currently use alcohol. He reports that he does not use drugs.    He indicated that his mother is deceased. He indicated that his father is deceased. He indicated that two of his three sisters are alive. He indicated that the status of his maternal grandfather is unknown. He indicated that his daughter is alive. He indicated that the status of his neg hx is unknown.           HPI:    Patient ID: Anthony Alvarado, 63 y.o., male  Duration:  several years (patient has prior history of cirrhosis status post liver transplant)  Location:external, bleeding  Severity:severe  Associated symptoms: external bleeding hemorrhoids  Alleviating factors: Resolution of colitis and diarrhea  Aggravating factors: Diarrhea and the patient's had a recent bout of colitis beginning in June 2024 with multiple episodes of diarrhea which is aggravated the hemorrhoids resulting in hematochezia patient was hospitalized .  Daily aspirin  use 325 mg  Last colonoscopy:10/02/2022 flexible sigmoidoscopy -hemorrhoids found on perianal exam, solid stool found in sigmoid colon precluding visualization, rectum rectosigmoid colon and sigmoid colon otherwise appeared normal  Previous Treatment: Preparation H, stool softeners, fiber supplements, bleeding has subsided after resolution of colitis and diarrhea    11/03/2023  Patient states that hemorrhoids are currently asymptomatic.  He reports good success with use of [facility] hemorrhoid compound. Last colonoscopy August 2024 (incomplete due to poor prep). Report colonoscopy revealed colitis (?Crohns)  Rectal bleeding has improved.  Patient denies any complaints from hemorrhoid such as thrombosis bleeding itching pain or discomfort    Review of Systems   Constitutional:  Negative for chills, fever, malaise/fatigue and weight loss.   HENT:  Negative for congestion and sore throat.    Eyes:  Negative for double vision, photophobia and redness.   Respiratory:  Negative for cough, hemoptysis, shortness of breath, wheezing and stridor.    Cardiovascular:  Negative for chest pain, palpitations, orthopnea, claudication and leg swelling.   Gastrointestinal:  Positive for blood in stool and diarrhea. Negative for abdominal pain, constipation, melena, nausea and vomiting.   Genitourinary:  Negative for frequency, hematuria and urgency.   Musculoskeletal:  Negative for joint pain.   Skin:  Negative for itching and rash.   Neurological:  Negative for dizziness, tremors, focal weakness, seizures, loss of consciousness, weakness and headaches.   Endo/Heme/Allergies:  Negative for polydipsia. Does not bruise/bleed easily.   Psychiatric/Behavioral:  Negative for memory loss and suicidal ideas. The patient does not have insomnia.      PHYSICAL EXAM:   BP 132/80  - Pulse 65  - Temp 36.6 ??C (97.8 ??F) (Temporal)  - Ht 172.7 cm (5' 8)  - Wt 94.9 kg (209 lb 4.8 oz)  - SpO2 99%  - BMI 31.82 kg/m??     Physical Exam  Constitutional:       General: He is not in acute distress.     Appearance: Normal appearance. He is well-developed and normal weight. He is not ill-appearing, toxic-appearing or diaphoretic.   HENT:      Head: Normocephalic and atraumatic.      Right Ear: External ear normal.      Left Ear: External ear normal.      Nose: Nose normal.      Mouth/Throat:      Pharynx: No oropharyngeal exudate.   Eyes:      General: No scleral icterus.        Right eye: No discharge.         Left eye: No discharge.      Conjunctiva/sclera: Conjunctivae normal.      Pupils: Pupils are equal, round, and reactive to light.   Neck:      Thyroid: No thyromegaly.      Vascular: No JVD.      Trachea: No tracheal deviation.   Cardiovascular:      Rate and Rhythm: Normal rate and regular rhythm.      Heart sounds: Normal heart sounds. No murmur heard.     No friction rub.  No gallop.   Pulmonary:      Effort: Pulmonary effort is normal. No respiratory distress.      Breath sounds: Normal breath sounds. No stridor. No wheezing, rhonchi or rales.   Chest:      Chest wall: No tenderness.   Abdominal:      General: Bowel sounds are normal. There is no distension.      Palpations: Abdomen is soft. There is no mass.      Tenderness: There is no abdominal tenderness. There is no right CVA tenderness, left CVA tenderness, guarding or rebound.      Hernia: No hernia is present.   Genitourinary:     Rectum: Guaiac result negative. Tenderness, external hemorrhoid and internal hemorrhoid present. No mass. Normal anal tone.        Comments: 2 external hemorrhoids  Non thrombosed  Non inflamed  Non tender  No bleeding  Musculoskeletal:         General: No tenderness or deformity. Normal range of motion.      Cervical back: Normal range of motion and neck supple. No rigidity or tenderness.   Lymphadenopathy:      Cervical: No cervical adenopathy.   Skin:     General: Skin is warm and dry.      Coloration: Skin is not pale.      Findings: No erythema or rash.   Neurological:      General: No focal deficit present.      Mental Status: He is alert and oriented to person, place, and time.      Cranial Nerves: No cranial nerve deficit.      Motor: Weakness present.      Coordination: Coordination normal.      Gait: Gait normal.      Deep Tendon Reflexes: Reflexes are normal and symmetric.   Psychiatric:         Mood and Affect: Mood normal.         Behavior: Behavior normal.         Thought Content: Thought content normal.         Judgment: Judgment normal.        Data Review:    Lab Results   Component Value Date    WBC 9.4 11/01/2023    HGB 13.4 11/01/2023    HCT 43.8 11/01/2023    PLT 228 11/01/2023       Lab Results   Component Value Date    NA 136 11/01/2023    K 4.8 11/01/2023    CL 104 11/01/2023    CO2 18 (L) 11/01/2023    BUN 21 11/01/2023    CREATININE 1.39 (H) 11/01/2023    GLU 309 (H) 09/28/2023    CALCIUM 9.8 11/01/2023    MG 1.6 11/01/2023    PHOS 4.1 09/28/2023       Lab Results   Component Value Date    BILITOT 0.3 11/01/2023    BILIDIR 0.08 11/01/2023    PROT 7.4 11/01/2023    ALBUMIN 4.2 09/28/2023    ALT 23 11/01/2023    AST 23 11/01/2023    ALKPHOS 101 11/01/2023    GGT 34 11/01/2023       Lab Results   Component Value Date    PT 13.0 (H) 10/31/2022    INR 1.17 10/31/2022    APTT 29.6 10/12/2018     Imaging:    IMPRESSION: CTA Abdomen Pelvis W Wo Contrast : 09/21/2022  1. Evidence of  ongoing widespread Colitis since the CT last month,   most pronounced in the sigmoid colon.   New small volume of simple density free fluid in the pelvis, likely   reactive. No evidence of bowel perforation or obstruction.   Negative for active GI bleeding by CTA.   2. No other acute or inflammatory process identified.     IMPRESSION: CT Abdomen Pelvis W Contrast 08/16/2022  1. Moderate severity descending and sigmoid colitis.   2. Evidence of prior cholecystectomy and prior appendectomy.   3. Mild to moderate severity urinary bladder wall thickening, which   may be secondary to chronic bladder outlet obstruction. Further   evaluation with urinalysis is recommended, as sequelae associated   with acute cystitis cannot be excluded.

## 2023-11-08 ENCOUNTER — Ambulatory Visit: Admit: 2023-11-08 | Discharge: 2023-11-09 | Payer: Medicare (Managed Care)

## 2023-11-08 DIAGNOSIS — E119 Type 2 diabetes mellitus without complications: Principal | ICD-10-CM

## 2023-11-08 DIAGNOSIS — E039 Hypothyroidism, unspecified: Principal | ICD-10-CM

## 2023-11-08 DIAGNOSIS — Z944 Liver transplant status: Principal | ICD-10-CM

## 2023-11-08 DIAGNOSIS — Z79899 Other long term (current) drug therapy: Principal | ICD-10-CM

## 2023-11-08 MED ORDER — FREESTYLE LIBRE 3 SENSOR DEVICE
ORAL | 11 refills | 0.00000 days | Status: CP
Start: 2023-11-08 — End: ?

## 2023-11-08 NOTE — Unmapped (Signed)
 Libre linked and uploaded into [employer]. POC glucose and A1C done today. PP 1pm. 148 mg/dL.

## 2023-11-08 NOTE — Unmapped (Signed)
 ASSESSMENT and PLAN:   7 M s/p liver transplant 2022, recent MRI liver with some steatosis in transplant, obesity, T2DM    Over the past year has developed insulin  requirement likely due to recent steroids and weight gain.    We discussed importance of using the insulin  and not missing. CGM back on will help.    We talked about weight loss. GLP1 discussed but given all of his GI issues would prefer not to start anything that could have GI side effects     1. Type 2 diabetes mellitus with hyperglycemia, with long-term current use of insulin     (Primary)  A1C 8% today   Says that he has been frequently missing humalog  and occasionally missing NPH    Continue NPH 14 QAM and ADD NPH 8 units in PM  Continue Humalog  8 for breakfast, 10 units for lunch, 10 units with dinner  Plus Humalog  scale: for every 50 points of blood sugar above 150, add another 2 units.  Really focus on using the humalog  correctly. Education on using humalog  to cover meals even when blood glucose is normal and using before rather than after meals    Continue Jardiance  25 mg PO daily.  Continue Tradjenta  5 mg PO once daily.  confirmed with [employer] that patient is approved through 02/23/24 for Tradjenta  5 mg and Jardiance  25 mg #90 tablets for 90 day supply and is opted into automatic refills.  Has PCP appt next month and will have A1c checked there.  Reviewed signs/symptoms/treatment of hypoglycemia.   Continue Freestyle Libre 3 CGM after phone upgraded.   Microalb: neg in 03/2023  Optho: scheduled next month  Neuropathy: no neuropathy but has been feeling some ankle and arch pain. Suggested some arch support insoles    2. Hypothyroid:  Continue LT4 175    RTC in 3 months    SUBJECTIVE:   Anthony Alvarado is a 63 y.o. year old male with a history of diabetes post-transplant who presents today for a diabetes-related visit. PMH includes liver transplant (2022), hepatic steatosis, obesity, hypothyroidism, and HTN, CAD s/p CABG (2023)    INTERIM:  off Prednisone   Restarted CGM 2 days ago    Continues on Trajenta and jardiance  but says that he keeps hoping he won't need any insulin  anymore so he doesn't use the insulin  as often as he should  No hypoglycemia  Has also gained about 10 pounds and has not been exercising as before due to joint pains        DM history:  2018: had pre-DM before liver transplant and controlled with diet, transiently on insulin  but able to stop  2020: Liver transplant and since transplant has been on MTF and jardiance     Weight: since transplant has gained about 25 pounds; minimal exercise due to habit  Diet: cereal for breakfast, fast food at least once a day because too tired to cook      Past Medical History:   Diagnosis Date    Acute blood loss anemia 09/21/2022    Aortic valve stenosis     Autoimmune cholangitis (CMS-HCC)     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    Cholestatic cirrhosis    (CMS-HCC)     Community acquired pneumonia 11/14/2022    GERD (gastroesophageal reflux disease)     Heart murmur 1997    BICUSPID AORTIC VALVE    Hepatic encephalopathy    (CMS-HCC)     Hyperlipidemia  Hypertension     Left hand pain 03/19/2021    Liver cancer    (CMS-HCC) it was on my old liver before transplant    Mild intermittent asthma with exacerbation (HHS-HCC) 04/29/2022    Other osteoporosis without current pathological fracture     Paresthesia 01/06/2023    Pneumonia 10/2022    Rash 09/25/2009    Qualifier: Diagnosis of   By: Cleatus MD, Arlyss      Severe aortic stenosis 05/11/2023    Sleep apnea     Type 2 diabetes mellitus    (CMS-HCC)        Current Outpatient Medications   Medication Sig Dispense Refill    aspirin  325 MG tablet Take 1 tablet (325 mg total) by mouth daily.      azelastine (ASTELIN) 137 mcg (0.1 %) nasal spray 1 spray into each nostril daily as needed.      blood sugar diagnostic (ONETOUCH ULTRA TEST) Strp USE TO CHECK SUGAR DAILY      blood-glucose sensor (FREESTYLE LIBRE 3 SENSOR) Devi For use as directed. change every 14 days. 3 each 11    calcium carbonate (TUMS) 200 mg calcium (500 mg) chewable tablet Chew 1 tablet (500 mg total) daily. Pt reports taking every other day. (Patient taking differently: Chew 1 tablet (500 mg total) as needed.)      cholecalciferol , vitamin D3, (VITAMIN D3 ORAL) Take by mouth.      fluticasone propionate (FLONASE) 50 mcg/actuation nasal spray PLACE ONE OR TWO SPRAYS INTO BOTH NOSTRILS DAILY AS NEEDED.      hydrocortisone  1 % crpe       insulin  lispro (HUMALOG  KWIKPEN INSULIN ) 100 unit/mL injection pen Inject up to 25 units under the skin three times daily before meals (75 units per day). 90 mL 3    insulin  NPH isoph U-100 human (HUMULIN  N NPH INSULIN  KWIKPEN) 100 unit/mL (3 mL) injection pen Inject up to 25 units under the skin once daily as directed. 30 mL 3    JARDIANCE  25 mg tablet Take one tablet by mouth daily. 90 tablet 1    levothyroxine  (SYNTHROID ) 175 MCG tablet Take 1 tablet (175 mcg total) by mouth daily before breakfast. 90 tablet 2    metoPROLOL  tartrate (LOPRESSOR ) 25 MG tablet Take 1 tablet (25 mg total) by mouth two (2) times a day. 180 tablet 1    pen needle, diabetic 32 gauge x 5/32 (4 mm) Ndle Use with insulin  up to 4 times/day as needed. 100 each 1    pravastatin  (PRAVACHOL ) 40 MG tablet Take 1 tablet (40 mg total) by mouth every evening. 30 tablet 2    risankizumab -rzaa (SKYRIZI ) 360 mg/2.4 mL (150 mg/mL) Injt Inject the contents of 1 cartridge (360mg ) under the skin every 8 weeks 2.4 mL 2    sertraline  (ZOLOFT ) 50 MG tablet Take 1 tablet (50 mg total) by mouth daily. 90 tablet 0    tacrolimus  (PROGRAF ) 1 MG capsule Take 2 capsules (2 mg total) by mouth two (2) times a day. 360 capsule 3    TRADJENTA  5 mg Tab TAKE 1 TABLET BY MOUTH DAILY 90 tablet 2     No current facility-administered medications for this visit.       Allergies   Allergen Reactions    Mycophenolate  Mofetil Diarrhea     Mycophenolate  colitis     Watermelon Flavor Mouth itching       Social History  Social History     Socioeconomic History  Marital status: Legally Separated     Spouse name: tammy    Number of children: 1   Tobacco Use    Smoking status: Never    Smokeless tobacco: Never   Vaping Use    Vaping status: Never Used   Substance and Sexual Activity    Alcohol use: Not Currently    Drug use: Never    Sexual activity: Yes     Partners: Female   Other Topics Concern    Do you use sunscreen? Yes    Tanning bed use? No    Are you easily burned? Yes    Excessive sun exposure? No    Blistering sunburns? Yes     Social Drivers of Health     Financial Resource Strain: Medium Risk (01/04/2023)    Received from [facility]    Overall Financial Resource Strain (CARDIA)     Difficulty of Paying Living Expenses: Somewhat hard   Food Insecurity: Unknown (10/01/2023)    Received from [facility]    Hunger Vital Sign     Within the past 12 months, you worried that your food would run out before you got the money to buy more.: Patient declined     Within the past 12 months, the food you bought just didn't last and you didn't have money to get more.: Never true   Transportation Needs: No Transportation Needs (10/01/2023)    Received from [facility] - Transportation     In the past 12 months, has lack of transportation kept you from medical appointments or from getting medications?: No     In the past 12 months, has lack of transportation kept you from meetings, work, or from getting things needed for daily living?: No   Physical Activity: Inactive (10/01/2023)    Received from [facility]    Exercise Vital Sign     On average, how many days per week do you engage in moderate to strenuous exercise (like a brisk walk)?: 0 days   Stress: No Stress Concern Present (10/01/2023)    Received from [facility] - Occupational Stress Questionnaire     Do you feel stress - tense, restless, nervous, or anxious, or unable to sleep at night because your mind is troubled all the time - these days?: Only a little   Social Connections: Moderately Isolated (10/01/2023)    Received from [facility]    Social Connection and Isolation Panel     In a typical week, how many times do you talk on the phone with family, friends, or neighbors?: More than three times a week     How often do you get together with friends or relatives?: Once a week     How often do you attend church or religious services?: More than 4 times per year     Do you belong to any clubs or organizations such as church groups, unions, fraternal or athletic groups, or school groups?: No     Are you married, widowed, divorced, separated, never married, or living with a partner?: Separated   Housing: Low Risk  (09/30/2022)    Housing     Within the past 12 months, have you ever stayed: outside, in a car, in a tent, in an overnight shelter, or temporarily in someone else's home (i.e. couch-surfing)?: No     Are you worried about losing your housing?: No  Family History  No DM in family    Social Hx:  On disability  Smoke: none  EtOH: none       Review of Systems  A 10 systems reviewed and were negative except for pertinent items noted in the HPI and below:    Eyes: denies blurred vision; last dilated eye exam showed no diabetic retinopathy  CVS: denies CP   PULM:  denies SOB     Lab Review    DIABETES MELLITUS RESULTS:    HGB A1C, POC (%)   Date Value   03/29/2023 6.4     Hemoglobin A1C (%)   Date Value   09/15/2022 7.4 (H)   04/06/2022 7.4 (H)   09/18/2019 6.6 (H)     Glucose, POC (mg/dL)   Date Value   97/96/7974 184 (H)   11/09/2022 196 (H)   11/04/2022 278 (H)         Lab Results   Component Value Date    CREATININE 1.39 (H) 11/01/2023     Lab Results   Component Value Date    CHOL 83 03/29/2023     Lab Results   Component Value Date    HDL 31 (L) 03/29/2023     No components found for: LDLCALC, DIRECTLDL  Lab Results   Component Value Date    TRIG 195 (H) 03/29/2023     Lab Results Component Value Date    TSH 1.440 06/19/2019     Creatinine (mg/dL)   Date Value   90/91/7974 1.39 (H)   09/28/2023 1.14   07/05/2023 1.38 (H)   06/11/2023 1.40 (H)

## 2023-11-08 NOTE — Unmapped (Addendum)
 1) START NPH 14 at 9AM and then take NPH 8 units at 9PM    2) Continue the Humalog  meals with scale.    3) Continue the Jardiance  and Trajenta.    4)  Try to increase your exercise and shoot for a 5 pound weight loss next time I see you.

## 2023-11-09 ENCOUNTER — Ambulatory Visit
Admit: 2023-11-09 | Discharge: 2023-11-10 | Payer: Medicare (Managed Care) | Attending: Hematology & Oncology | Primary: Hematology & Oncology

## 2023-11-09 DIAGNOSIS — D72829 Elevated white blood cell count, unspecified: Principal | ICD-10-CM

## 2023-11-09 DIAGNOSIS — E611 Iron deficiency: Principal | ICD-10-CM

## 2023-11-09 DIAGNOSIS — D72828 Other elevated white blood cell count: Principal | ICD-10-CM

## 2023-11-09 NOTE — Unmapped (Signed)
 [facility] New Patient Evaluation          63 y.o. old patient who presents as a consultation referral from Donald KATHEE Matter, [place] for evaluation of Long-standing intermittent Leukocytosis.     HISTORY OF PRESENT ILLNESS:      Questions the patient has to me today:  I have funny blood numbers  I have some aches and pains  I have low iron  lately    NOW  Colonoscopy 04/2023 at [facility]:                                                Impression:            - The examined portion of the ileum was normal.                         - Eroded (linear-pattern), nodular, pseudopolypoid,                          scarred and ulcerated mucosa in the sigmoid colon, in                          the transverse colon, at the hepatic flexure, in the                          ascending colon and in the cecum. Biopsied.                         - Pseudopolyps in the entire examined colon.                         - Simple Endoscopic Score for Crohn's Disease: 14,                          mucosal inflammatory changes secondary to Crohn's                          disease with colonic involvement.                         - Perianal skin tags found on perianal exam.                         Suspicion of inflammatory bowel disease (Crohn's                          disease), but need to wait for biopsies.     No visible blood in stool since early 2025.     Had iv iron  Ferrlecit  250 mg q 12 hrs in 2024- unclear to me how many doses. Also had iv iron  dextran at [facility] 4 weeks ago x 1, 1000 mg. He is now on oral iron , started  about 2-3 weekago.     Aches and pains: lower back; elbow, knees; little shoulder; not really hips; anle and feet a little; fingers a little.     I get light-headed like I am buzzed from something - for at least 1 year; stable.  I get dizzy spells out of the blue, since age 21.     Had prednisone  early 2025, but not now.    PAST MEDICAL HISTORY:   Liver transplant 2020 at [facility] - liver cirrhosis from fatty liver.   Heart valve replacement (aortic valve, cow tissue) and CABG 2023: on ASA 325 mg every day.  Appendectomy  Iron  deficiency  Crohn's disease - dx'd 2025; symptoms onset 2023-2025  Hemoorhoids, external    MEDICATIONS:      Encounter Medications[1]    SOCIAL HISTORY:   [employer].  Mechanic gasoline cars; now: piddling; last worked 2020.     FAMILY HISTORY:   One daughter, age 41,  He has 2 living sisters; an  3rd one died of heart issues.   No known Fam hx of blood dzs.     PHYSICAL EXAM:   There were no vitals taken for this visit.    General Appearance:    Alert, cooperative, no distress, appears stated age   Abdomen:     Soft, non-tender, protuberant.    Extremities: No edema, no difference in circumference between right and left calf.   Skin:   Skin color, texture, turgor normal, no rashes or lesions   Lymph nodes:   Cervical, supraclavicular, and axillary nodes normal         LABS:              ASSESSMENT:      Leukocytosis: Intermittent leukocytosis (due to neutrophilia; present in June to July 2024; also in September 2024, then from November 2024 until May 2024, but frequently normal WBCs in between, such as July 2025 and August and September 2025).  No anemia or thrombocytopenia.  Contributors: A. Intermittently active Crohn's disease.There is no suspicion for an underlying hematological disorder.  This is a reactive leukocytosis and does not require hematological workup or intervention.     Microcytosis: He has a history of normal MCV's up until July 2024, then mild microcytosis with MCV nadir of 78.5 in July 2024, recovering, and then low again with a nadir of 73.8 and August 2025.  However, by September 2025 his MCV was normal again at 84.  The fluctuating MCV indicates intermittent iron  deficiency, as also evidenced by a low ferritin of 11.7 on 09/28/2023.  Etiology of the iron  deficiency: A.  Crohn's disease with GI bleeding.  He is status post some IV iron  in 2024-unclear to me how many doses and what total dose -and again approximately 4 weeks ago at [facility] (1000 mg Infed - iron  dextran). His GI/liver transplant team will follow the issue.    PLAN:      No blood work today  No further hematological workup  Follow-up with GI and liver transplant as planned  No follow-up with me was arranged.      Elia Parents, MD  Professor of Medicine  Division of Hematology  [facility]  [facility] [employer]  [id number]  [place], [place] [address]    Clinic  Main number: [phone]  Fax: [phone]  Schedulers: Charmaine Shams, Darice Constable  Nurse: Will Mace      This note was dictated with Dragon Medical and may contain typos missed during proofreading.         [1]   Outpatient Encounter Medications as of 11/09/2023   Medication Sig Dispense Refill    aspirin  325 MG tablet Take 1 tablet (325 mg total) by mouth daily.      azelastine (ASTELIN) 137  mcg (0.1 %) nasal spray 1 spray into each nostril daily as needed.      blood sugar diagnostic (ONETOUCH ULTRA TEST) Strp USE TO CHECK SUGAR DAILY      blood-glucose sensor (FREESTYLE LIBRE 3 SENSOR) Devi For use as directed. change every 14 days. 3 each 11    calcium carbonate (TUMS) 200 mg calcium (500 mg) chewable tablet Chew 1 tablet (500 mg total) daily. Pt reports taking every other day. (Patient taking differently: Chew 1 tablet (500 mg total) as needed.)      cholecalciferol , vitamin D3, (VITAMIN D3 ORAL) Take by mouth.      fluticasone propionate (FLONASE) 50 mcg/actuation nasal spray PLACE ONE OR TWO SPRAYS INTO BOTH NOSTRILS DAILY AS NEEDED.      [EXPIRED] glucose 4 GM chewable tablet Chew 4 tablets (16 g total) every ten (10) minutes as needed for low blood sugar ((For Blood Glucose LESS than 70 mg/dL and GREATER than or EQUAL to 54 mg/dL and able to take by mouth.)). 50 tablet 12    hydrocortisone  1 % crpe       insulin  lispro (HUMALOG  KWIKPEN INSULIN ) 100 unit/mL injection pen Inject up to 25 units under the skin three times daily before meals (75 units per day). 90 mL 3    insulin  NPH isoph U-100 human (HUMULIN  N NPH INSULIN  KWIKPEN) 100 unit/mL (3 mL) injection pen Inject up to 25 units under the skin once daily as directed. 30 mL 3    JARDIANCE  25 mg tablet Take one tablet by mouth daily. 90 tablet 1    levothyroxine  (SYNTHROID ) 175 MCG tablet Take 1 tablet (175 mcg total) by mouth daily before breakfast. 90 tablet 2    metoPROLOL  tartrate (LOPRESSOR ) 25 MG tablet Take 1 tablet (25 mg total) by mouth two (2) times a day. 180 tablet 1    pen needle, diabetic 32 gauge x 5/32 (4 mm) Ndle Use with insulin  up to 4 times/day as needed. 100 each 1    pravastatin  (PRAVACHOL ) 40 MG tablet Take 1 tablet (40 mg total) by mouth every evening. 30 tablet 2    risankizumab -rzaa (SKYRIZI ) 360 mg/2.4 mL (150 mg/mL) Injt Inject the contents of 1 cartridge (360mg ) under the skin every 8 weeks 2.4 mL 2    sertraline  (ZOLOFT ) 50 MG tablet Take 1 tablet (50 mg total) by mouth daily. 90 tablet 0    tacrolimus  (PROGRAF ) 1 MG capsule Take 2 capsules (2 mg total) by mouth two (2) times a day. 360 capsule 3    TRADJENTA  5 mg Tab TAKE 1 TABLET BY MOUTH DAILY 90 tablet 2     No facility-administered encounter medications on file as of 11/09/2023.

## 2023-11-10 NOTE — Unmapped (Signed)
 Colonoscopy  Procedure #1     Procedure #2   [id number]  MRN   [facility]  Endoscopist     Is the patient's health insurance [employer], [employer]et[employer], [facility]), [employer] Med [place], [employer], or [place]?     Urgent procedure     Are you pregnant? (Ignore if [facility] GI provider has OK'd procedure in order comments despite pregnancy)     Are you in the process of scheduling or awaiting results of a heart ultrasound, stress test, or catheterization to evaluate new or worsening chest pain, dizziness, or shortness of breath?     Do you have achalasia or gastroparesis or take Mounjaro, Zepbound, Delphos, Trulicity, Ozempic , Victoza, Saxenda, Byetta, Bydureon, Rybelsus, or Adlyxin?      Do you take: Plavix (clopidogrel), Coumadin (warfarin), Lovenox  (enoxaparin ), Pradaxa (dabigatran), Effient (prasugrel), Xarelto (rivaroxaban), Eliquis (apixaban), Pletal (cilostazol), or Brilinta (ticagrelor)?          Did ordering provider indicate how long to hold this medication in the order comments?          Which of the above medications are you taking?          What is the name of the medical practice that manages this medication?          What is the name of the medical provider who manages this medication?     Do you have hemophilia, von Willebrand disease, or low platelets?     Do you have a pacemaker or implanted cardiac defibrillator?     Has a [facility] provider specified the location(s)?     Which location(s) did the [facility] provider specify?          [facility]          [place]          [facility]      Do you see a liver specialist for chronic liver disease?     Is the procedure indication for variceal screening?     Is procedure indication for variceal banding (this does NOT include variceal screening)?     Have you had a heart attack, stroke or heart stent placement within the past 6 months?     Month of event     Year of event (ONLY ENTER LAST 2 DIGITS)        5  Height (feet)   9  Height (inches)   206  Weight (pounds) 30.4  BMI          Did the ordering provider specify a bowel prep?          What bowel prep was specified?     Do you have an ostomy (bag on your stomach that collects your stool)?          Is it an ileostomy?          Is it a colostomy?          Patient doesn't know.     Do you have chronic kidney disease?     Do you have chronic constipation or have you had poor quality bowel preps for past colonoscopies?   TRUE  Do you have Crohn's disease or ulcerative colitis?     Have you had weight loss surgery?          When you walk around your house or grocery store, do you have to stop and rest due to shortness of breath, chest pain, or light-headedness?     Do you ever  use supplemental oxygen ?     Have you been hospitalized for cirrhosis of the liver or heart failure in the last 12 months?     Have you been treated for mouth or throat cancer with radiation or surgery?     Have you been told that it is difficult for doctors to insert a breathing tube in you during anesthesia?     Have you had a heart or lung transplant?          Are you on dialysis?     Have you started dialysis in the last 6 months?     Do you have cirrhosis of the liver?     Do you have myasthenia gravis?     Is the patient a prisoner?   ################# ## ###################################################################################################################   MRN:  [id number]   Anticoag Review No   Nurse Triage  No   Procedure(s):  Colonoscopy     0   Endoscopist:  [facility]   Urgent:  No   Prep:  Miralax  Prep                                 --------------------------- --- ----------------------------------------------------------------------------------------------------------------------------------------------------------------------------   [facility] Locations:  [facility]     [facility]      [place]        Requested Locations:              ################# ## ###################################################################################################################

## 2023-11-22 DIAGNOSIS — Z79899 Other long term (current) drug therapy: Principal | ICD-10-CM

## 2023-11-22 DIAGNOSIS — Z944 Liver transplant status: Principal | ICD-10-CM

## 2023-11-23 ENCOUNTER — Other Ambulatory Visit: Payer: Self-pay | Admitting: Family Medicine

## 2023-11-23 MED ORDER — PEN NEEDLE, DIABETIC 32 GAUGE X 5/32" (4 MM)
ORAL | 1 refills | 0.00000 days | Status: CP
Start: 2023-11-23 — End: ?
  Filled 2023-11-29: qty 100, 25d supply, fill #0

## 2023-11-23 MED ORDER — LEVOTHYROXINE 175 MCG TABLET
ORAL_TABLET | Freq: Every day | ORAL | 2 refills | 90.00000 days
Start: 2023-11-23 — End: ?

## 2023-11-23 MED ORDER — PRAVASTATIN 40 MG TABLET
ORAL_TABLET | Freq: Every evening | ORAL | 2 refills | 30.00000 days
Start: 2023-11-23 — End: ?

## 2023-11-23 MED ORDER — SERTRALINE 50 MG TABLET
ORAL_TABLET | Freq: Every day | ORAL | 0 refills | 90.00000 days
Start: 2023-11-23 — End: ?

## 2023-11-23 NOTE — Unmapped (Signed)
 [facility] Specialty and Home Delivery Pharmacy Refill Coordination Note    Specialty Medication(s) to be Shipped:   Transplant: SKYRIZI  360 mg/2.4 mL (150 mg/mL) Injt (risankizumab -rzaa)    Other medication(s) to be shipped: levothyroxine , sertraline , pravastatin , pen needles    Specialty Medications not needed at this time: N/A     Anthony Alvarado, DOB: February 01, 1961  Phone: [phone] (home)       All above HIPAA information was verified with patient.     Was a Nurse, learning disability used for this call? No    Completed refill call assessment today to schedule patient's medication shipment from the [facility] Delivery [facility]  [phone]).  All relevant notes have been reviewed.     Specialty medication(s) and dose(s) confirmed: Regimen is correct and unchanged.   Changes to medications: Burlin reports no changes at this time.  Changes to insurance: No  New side effects reported not previously addressed with a pharmacist or physician: None reported  Questions for the pharmacist: No    Confirmed patient received a Conservation officer, historic buildings and a Surveyor, mining with first shipment. The patient will receive a drug information handout for each medication shipped and additional FDA Medication Guides as required.       DISEASE/MEDICATION-SPECIFIC INFORMATION        For patients on injectable medications: Next injection is scheduled for 12/03/23.    SPECIALTY MEDICATION ADHERENCE     Medication Adherence    Patient reported X missed doses in the last month: 0  Specialty Medication: SKYRIZI  360 mg/2.4 mL (150 mg/mL) Injt (risankizumab -rzaa)  Patient is on additional specialty medications: No  Patient is on more than two specialty medications: No  Any gaps in refill history greater than 2 weeks in the last 3 months: no  Demonstrates understanding of importance of adherence: yes  Informant: patient  Confirmed plan for next specialty medication refill: delivery by pharmacy  Refills needed for supportive medications: not needed          Refill Coordination    Has the Patients' Contact Information Changed: No  Is the Shipping Address Different: No         Were doses missed due to medication being on hold? No    SKYRIZI  360 /2.4  mg/ml: 0 doses of medicine on hand       REFERRAL TO PHARMACIST     Referral to the pharmacist: Not needed      [facility]     Shipping address confirmed in Epic.     Cost and Payment: Patient has a $0 copay, payment information is not required.    Delivery Scheduled: Yes, Expected medication delivery date: 11/30/23.     Medication will be delivered via UPS to the prescription address in Epic WAM.    [name]   [facility] Special[facility] and Home Delivery Pharm[facility]  Specialty Technician

## 2023-11-24 DIAGNOSIS — E119 Type 2 diabetes mellitus without complications: Secondary | ICD-10-CM | POA: Diagnosis not present

## 2023-11-24 MED ORDER — PRAVASTATIN 40 MG TABLET
ORAL_TABLET | Freq: Every evening | ORAL | 2 refills | 30.00000 days
Start: 2023-11-24 — End: ?

## 2023-11-24 MED ORDER — SERTRALINE 50 MG TABLET
ORAL_TABLET | Freq: Every day | ORAL | 0 refills | 90.00000 days
Start: 2023-11-24 — End: ?

## 2023-11-29 MED FILL — SKYRIZI 360 MG/2.4 ML (150 MG/ML) SUBCUTANEOUS WEARABLE INJECTOR: SUBCUTANEOUS | 56 days supply | Qty: 2.4 | Fill #1

## 2023-11-29 MED FILL — PRAVASTATIN 40 MG TABLET: ORAL | 30 days supply | Qty: 30 | Fill #0

## 2023-11-29 MED FILL — SERTRALINE 50 MG TABLET: ORAL | 90 days supply | Qty: 90 | Fill #0

## 2023-11-30 DIAGNOSIS — Z944 Liver transplant status: Principal | ICD-10-CM

## 2023-11-30 NOTE — Unmapped (Signed)
 Sent mychart message asking pt to please contact this tpa re scheduling annual appt by [employer] or phone call. Left call-back number. 1st call.

## 2023-12-01 MED ORDER — JARDIANCE 25 MG TABLET
ORAL_TABLET | Freq: Every day | ORAL | 3 refills | 90.00000 days | Status: CP
Start: 2023-12-01 — End: ?

## 2023-12-02 MED ORDER — LEVOTHYROXINE 175 MCG TABLET
ORAL_TABLET | Freq: Every day | ORAL | 2 refills | 90.00000 days
Start: 2023-12-02 — End: ?

## 2023-12-03 DIAGNOSIS — Z944 Liver transplant status: Principal | ICD-10-CM

## 2023-12-03 NOTE — Unmapped (Signed)
 Noted patient has not repeated labs since 9/8 when tac level was above goal (despite recent reduction) - call placed to remind patient about need to repeat labs.  Confirms he has a new grandbaby that has been occupying his time.    Has appts @ [facility] next week so will plan to complete labs then.  Also encouraged him to contact [facility] who sent him a MyCHart message regarding securing annual appt for 2026 - he verbalized agreement.

## 2023-12-03 NOTE — Progress Notes (Signed)
 [name]                                          MRN: [id number]   12/03/2023   The [facility]BCI Quality Team Specialist reviewed this patient medical record for the purposes of chart review for care gap closure. The following were reviewed: abstraction for care gap closure-glycemic status assessment.    VBCI Quality Team

## 2023-12-06 ENCOUNTER — Other Ambulatory Visit: Payer: Self-pay | Admitting: Family Medicine

## 2023-12-06 DIAGNOSIS — Z79899 Other long term (current) drug therapy: Principal | ICD-10-CM

## 2023-12-06 DIAGNOSIS — Z944 Liver transplant status: Principal | ICD-10-CM

## 2023-12-06 MED ORDER — LEVOTHYROXINE SODIUM 175 MCG PO TABS
175.0000 ug | ORAL_TABLET | Freq: Every day | ORAL | 2 refills | Status: AC
Start: 2023-12-06 — End: ?

## 2023-12-06 MED ORDER — LEVOTHYROXINE 175 MCG TABLET
ORAL_TABLET | Freq: Every day | ORAL | 2 refills | 90.00000 days
Start: 2023-12-06 — End: ?

## 2023-12-07 NOTE — Unmapped (Unsigned)
 [facility]   MULTIDISCIPLINARY INFLAMMATORY BOWEL DISEASES CENTER  Date:  12/07/2023  Patient: Anthony Alvarado    INTERVAL    Anthony Alvarado returns today for ongoing care.    He is only feeling so-so. He passes 1-3 semi-formed, occasionally loose, non-bloody stools daily. He experiences mild abdominal pain every day.      His appetite has been mostly okay, but worse over the past several days.  His energy level is below average.    He is experiencing arthralgias in all of his joints, but doesn't know whether this is inflammatory arthritis or just osteoarthritis.     He started risankizumab  relatively recently. He has only received 2 doses of maintenance risankizumab .    INFLAMMATORY BOWEL DISEASE HISTORY  Year of disease onset:  2024  Diagnosis:  Crohn's Disease  Age at onset:  > 81 years old (A3)  Location:  Colonic (L2)  Behavior:  Nonstricturing, nonpenetrating (B1)  Perianal Disease:  No    Disease Course:    09/2012- Normal colonoscopy  10/2017- CT with normal bowels  12/2020- Normal bowels on CT A/P  07/2022- Admitted to OSH with diarrhea and rectal bleeding, CTA with hyperenhancement of thick-walled sigmoid colon. Transferred to [facility]  08/2022- Colonoscopy with prolapsed hemorrhoids, severe mucosal changes consistent with congestion, erythema, friability, deep ulcerations. Normal TI. Given oral steroids.  No response. Admitted to hospital and given IV steroids and IFX with improvement  Path: Severely active colitis with ulceration  09/2022- Flexible sigmoidoscopy with external hemorrhoids. Solid stool in sigmoid colon.  Distal colon normal  04/2023- Colonoscopy with normal TI.  Moderately eroded, nodular, pseudopolypoid, scarred mucosa in the sigmoid colon, transverse colon, hepatic flexure, ascending colon and cecum.  Scattered pseudopolyps. SES-CD 14  Path: Mildly active chronic colitis, indefinite for low-grade dysplasia (descending colon)  04/2023- Establish at [facility]). Start RISA  06/2023- Norovirus infection    PAST HISTORY  PMHx  -Cryptogenic cirrhosis s/p OLT  -Diabetes mellitus, type II  -Hypertension  -OSA    PSHx  -Liver transplant  -TAVR for aortic stenosis  -Pulmonary decortication  -Appendectomy    FHx  -No IBD or colon cancer    SHx  -No alcohol  -No tobacco  -Former Curator     MEDICATIONS  has a current medication list which includes the following prescription(s): ferrous sulfate , aspirin , azelastine, onetouch ultra test, freestyle libre 3 sensor, calcium carbonate, cholecalciferol  (vitamin d3), fluticasone propionate, hydrocortisone , insulin  lispro, humulin  n nph insulin  kwikpen, jardiance , levothyroxine , levothyroxine , metoprolol  tartrate, pen needle, diabetic, pravastatin , skyrizi , sertraline , tacrolimus , and tradjenta .    ALLERGIES  Allergies   Allergen Reactions    Mycophenolate  Mofetil Diarrhea     Mycophenolate  colitis     Watermelon Flavor      Mouth itching     PHYSICAL EXAM  BP 157/93 (BP Position: Sitting)  - Pulse 69  - Temp 36.3 ??C (97.3 ??F)  - Ht 172.7 cm (5' 7.99)  - Wt 94.8 kg (209 lb)  - BMI 31.79 kg/m??     Gen: Well-appearing, no acute distress  Eyes: no scleral icterus.  HENT:  No temporal wasting  GI: Nontender, non-distended.    Labs and imaging reviewed.    ASSESSMENT AND PLAN    Anthony Alvarado is a 63 y.o. man with inflammatory colonic Crohn's disease on risankizumab , and cryptogenic cirrhosis status post liver transplantation who returns today for ongoing care. Moderate-to-severe Crohn's disease is a chronic illness that presents a threat to bodily function in  the near term without treatment or despite treatment.    He is feeling somewhat improved, but has not achieved symptomatic remission while on risankizumab . I recommend further evaluation with fecal calprotectin.  If levels are normal or near-normal, we will continue risankizumab . If not, we will change therapy.  I would favor treatment with an anti-TNF agent, such as adalimumab.     Second, he is at risk for vitamin and mineral deficiencies. I will repeat screening today.     Third, he is due for Hepatitis B and influenza immunizations. I will give these to him today.    PLAN:  Continue risankizumab   Labs  Fecal calprotectin  Immunizations against hepatitis B and influenza    Return to clinic in 3 months.    Norleen Aspen, MD MS  Multidisciplinary Infl[facility] Bowel Diseases [facility]  [facility]  single:19197::Does not use,Counseled today, Previously discussed, Defer, Does not use}   NSAID abstinence {Blank single:19197::Does not use,Counseled today, Previously discussed, Defer, Does not use}         Norleen Aspen, MD MS  [facility]el [facility]  [facility] 

## 2023-12-09 ENCOUNTER — Ambulatory Visit
Admit: 2023-12-09 | Discharge: 2023-12-09 | Payer: Medicare (Managed Care) | Attending: Gastroenterology | Primary: Gastroenterology

## 2023-12-09 DIAGNOSIS — Z7185 Encounter for immunization safety counseling: Secondary | ICD-10-CM | POA: Diagnosis not present

## 2023-12-09 DIAGNOSIS — Z1159 Encounter for screening for other viral diseases: Principal | ICD-10-CM

## 2023-12-09 DIAGNOSIS — Z23 Encounter for immunization: Principal | ICD-10-CM

## 2023-12-09 DIAGNOSIS — Z944 Liver transplant status: Principal | ICD-10-CM

## 2023-12-09 DIAGNOSIS — K50111 Crohn's disease of large intestine with rectal bleeding: Principal | ICD-10-CM

## 2023-12-09 DIAGNOSIS — E119 Type 2 diabetes mellitus without complications: Secondary | ICD-10-CM | POA: Diagnosis not present

## 2023-12-09 DIAGNOSIS — K644 Residual hemorrhoidal skin tags: Secondary | ICD-10-CM | POA: Diagnosis not present

## 2023-12-09 DIAGNOSIS — Z87891 Personal history of nicotine dependence: Secondary | ICD-10-CM | POA: Diagnosis not present

## 2023-12-09 DIAGNOSIS — I10 Essential (primary) hypertension: Secondary | ICD-10-CM | POA: Diagnosis not present

## 2023-12-09 DIAGNOSIS — Z7984 Long term (current) use of oral hypoglycemic drugs: Secondary | ICD-10-CM | POA: Diagnosis not present

## 2023-12-09 LAB — IRON PANEL
IRON SATURATION: 18 % — ABNORMAL LOW (ref 20–55)
IRON: 58 ug/dL — ABNORMAL LOW (ref 65–175)
TOTAL IRON BINDING CAPACITY: 321 ug/dL (ref 250–425)

## 2023-12-09 LAB — C-REACTIVE PROTEIN: C-REACTIVE PROTEIN: <5 mg/L (ref <=10.0)

## 2023-12-09 LAB — HEPATITIS B SURFACE ANTIBODY
HEPATITIS B SURFACE ANTIBODY QUANT: <8 m[IU]/mL (ref <8.00)
HEPATITIS B SURFACE ANTIBODY: NONREACTIVE

## 2023-12-09 LAB — FERRITIN: FERRITIN: 83.2 ng/mL (ref 10.5–307.3)

## 2023-12-09 LAB — VITAMIN B12: VITAMIN B-12: 590 pg/mL (ref 211–911)

## 2023-12-09 LAB — HEPATITIS B SURFACE ANTIGEN: HEPATITIS B SURFACE ANTIGEN: NONREACTIVE

## 2023-12-09 LAB — HEPATITIS B CORE ANTIBODY, TOTAL: HEPATITIS B CORE TOTAL ANTIBODY: NONREACTIVE

## 2023-12-09 MED ADMIN — hepatitis B vacc-CpG (PF) (HEPLISAV-B) adult injection 0.5 mL: .5 mL | INTRAMUSCULAR | @ 15:00:00 | Stop: 2023-12-09

## 2023-12-09 NOTE — Unmapped (Signed)
 It was a pleasure seeing you today.  Here is a summary/wrap up from today's visit:     Continue Skyrizi   Labs today  Stool test today or tomorrow  If any trouble or symptoms, do not hesitate to call.     Norleen Aspen, MD MS  Multidisciplinary Inflammatory Bowel Diseases [facility]  [facility] 

## 2023-12-09 NOTE — Unmapped (Signed)
 Call placed to patient to discuss post txp labs not collected this AM - he will have these done @ [facility] tomorrow.  During call discussed new coordinator Katheryn Badger has assumed his care as of today - sent him contact information for Katheryn via [employer].

## 2023-12-10 DIAGNOSIS — K50111 Crohn's disease of large intestine with rectal bleeding: Principal | ICD-10-CM

## 2023-12-10 LAB — QUANTIFERON TB GOLD PLUS
QUANTIFERON ANTIGEN 1 MINUS NIL: 0.01 [IU]/mL
QUANTIFERON ANTIGEN 2 MINUS NIL: -0.01 [IU]/mL
QUANTIFERON MITOGEN: 9.98 [IU]/mL
QUANTIFERON TB GOLD PLUS: NEGATIVE
QUANTIFERON TB NIL VALUE: 0.02 [IU]/mL

## 2023-12-10 LAB — TB NIL: TB NIL VALUE: 0.02

## 2023-12-10 LAB — TB AG1: TB AG1 VALUE: 0.03

## 2023-12-10 LAB — VITAMIN D 25 HYDROXY: VITAMIN D, TOTAL (25OH): 49.3 ng/mL (ref 20.0–80.0)

## 2023-12-10 LAB — TB MITOGEN: TB MITOGEN VALUE: 10

## 2023-12-10 LAB — TB AG2: TB AG2 VALUE: 0.01

## 2023-12-10 NOTE — Unmapped (Signed)
 Patient LVM that the stool test he was supposed to complete at [facility], but [facility] did not have an order.   Labs were recently completed at [facility] health lab  Order for calprotectin, stool, but at [facility], ordered at [facility] and notified the pateint

## 2023-12-13 NOTE — Unmapped (Signed)
 IRON  THERAPY PLAN ORDER ENTRY DOCUMENTATION    The following order(s) have been placed for insurance authorization:    Therapy Plan Order  Medication: iron  dextran (INFED)  Dose & Frequency: 1000 mg IV once  Diagnosis: Iron  Deficiency & Crohn's Disease  Treatment Center: [facility]      Relevant Iron  Labs:  Lab Results   Component Value Date    HGB 13.4 11/01/2023    IRON  58 (L) 12/09/2023    FERRITIN 83.2 12/09/2023    LABIRON 18 (L) 12/09/2023         Requesting Physician: Dr. Norleen Aspen      Notes:  Received Infed 1000 mg on 10/12/2023 but iron  stores still not replete so will give additional dose. No premedications ordered as received Infed within 6 months    Dewayne Motley, PharmD, BCPS, CPP  GI/IBD - Clinical Pharmacist Practitioner

## 2023-12-15 ENCOUNTER — Ambulatory Visit
Admission: RE | Admit: 2023-12-15 | Discharge: 2023-12-15 | Disposition: A | Discharge status: Home / Self Care | Source: Ambulatory Visit | Attending: Family Medicine | Admitting: Family Medicine

## 2023-12-15 DIAGNOSIS — Z944 Liver transplant status: Principal | ICD-10-CM

## 2023-12-15 DIAGNOSIS — Z7951 Long term (current) use of inhaled steroids: Secondary | ICD-10-CM | POA: Diagnosis not present

## 2023-12-15 DIAGNOSIS — M8580 Other specified disorders of bone density and structure, unspecified site: Secondary | ICD-10-CM | POA: Insufficient documentation

## 2023-12-15 NOTE — Unmapped (Signed)
 Sent mychart message asking pt to please contact this tpa re scheduling annual appt by [employer] or phone call. Left call-back number. 2nd call.

## 2023-12-17 NOTE — Telephone Encounter (Signed)
 This Bone Density was done on 12/15/23  FYI, The Referral Team does not handle Bone Density orders.

## 2023-12-19 ENCOUNTER — Ambulatory Visit: Payer: Self-pay | Admitting: Family Medicine

## 2023-12-20 DIAGNOSIS — Z944 Liver transplant status: Principal | ICD-10-CM

## 2023-12-20 DIAGNOSIS — Z79899 Other long term (current) drug therapy: Principal | ICD-10-CM

## 2023-12-20 NOTE — Telephone Encounter (Signed)
 Per chart review look like patient has had study done.  No further action needed at this time.

## 2023-12-20 NOTE — Telephone Encounter (Signed)
 Pt called in response to this tpa's mychart messages re scheduling annual appt. Pt scheduled for 09/12/24, said he will get labs locally a week earlier, denied need for appt letter, and verbalized understanding of all discussed.

## 2023-12-23 NOTE — Progress Notes (Signed)
 [facility] Specialty and Home Delivery Pharmacy Refill Coordination Note    Specialty Medication(s) to be Shipped:   Transplant: tacrolimus  1mg     Other medication(s) to be shipped: Levothyroxine , Metoprolol , Pravastatin  and Pen Needles    Specialty Medications not needed at this time: Inflammatory Disorders: Skyrizi      Anthony Alvarado, DOB: 01/13/1961  Phone: [phone] (home)       All above HIPAA information was verified with patient.     Was a nurse, learning disability used for this call? No    Completed refill call assessment today to schedule patient's medication shipment from the [facility] and Home Delivery Pharmacy  [phone]).  All relevant notes have been reviewed.     Specialty medication(s) and dose(s) confirmed: Regimen is correct and unchanged.   Changes to medications: Anthony Alvarado reports no changes at this time.  Changes to insurance: No  New side effects reported not previously addressed with a pharmacist or physician: None reported  Questions for the pharmacist: No    Confirmed patient received a Conservation Officer, Historic Buildings and a Surveyor, Mining with first shipment. The patient will receive a drug information handout for each medication shipped and additional FDA Medication Guides as required.       DISEASE/MEDICATION-SPECIFIC INFORMATION        N/A    SPECIALTY MEDICATION ADHERENCE     Medication Adherence    Patient reported X missed doses in the last month: 0  Specialty Medication: tacrolimus  1 MG capsule (PROGRAF )  Patient is on additional specialty medications: No              Were doses missed due to medication being on hold? No      tacrolimus  1 MG capsule (PROGRAF ): 7 days of medicine on hand       REFERRAL TO PHARMACIST     Referral to the pharmacist: Yes - routine compliance concerns. Patient has missed 1-3 doses of medication. Refills were scheduled and concern routed to pharmacist for evaluation.      SHIPPING     Shipping address confirmed in Epic.     Cost and Payment: Patient has a $0 copay, payment information is not required.    Delivery Scheduled: Yes, Expected medication delivery date: 11.4.25.     Medication will be delivered via UPS to the prescription address in Epic WAM.    Anthony Alvarado   [facility] Specialty and Home Delivery Pharmacy  Specialty Technician

## 2023-12-23 NOTE — Progress Notes (Signed)
 This pharmacist was notified by a technician that this patient has reported that they've missed 2 doses of their tacrolimus .. I have reviewed the patient's medical record and have determined that no further pharmacist action is needed. Per SOT clinic touch point on 9/8, clinic will be notified if patient has missed 3+ doses of medication.         Approximate time spent: 0-5 minutes    Harlene Gal, PharmD, Clinical Specialty Pharmacist  [facility] Special[facility] and Home Delivery Pharmacy

## 2023-12-25 DIAGNOSIS — E119 Type 2 diabetes mellitus without complications: Secondary | ICD-10-CM | POA: Diagnosis not present

## 2023-12-27 MED FILL — METOPROLOL TARTRATE 25 MG TABLET: ORAL | 90 days supply | Qty: 180 | Fill #1

## 2023-12-27 MED FILL — TACROLIMUS 1 MG CAPSULE, IMMEDIATE-RELEASE: ORAL | 90 days supply | Qty: 360 | Fill #1

## 2023-12-27 MED FILL — PRAVASTATIN 40 MG TABLET: ORAL | 30 days supply | Qty: 30 | Fill #1

## 2023-12-27 MED FILL — PEN NEEDLE, DIABETIC 32 GAUGE X 5/32" (4 MM): ORAL | 25 days supply | Qty: 100 | Fill #1

## 2023-12-27 MED FILL — LEVOTHYROXINE 175 MCG TABLET: ORAL | 90 days supply | Qty: 90 | Fill #0

## 2023-12-27 NOTE — Telephone Encounter (Signed)
 Infed (iron  dextran) infusion has been scheduled for 01/12/2024 at [facility].    Dewayne Motley, PharmD, BCPS, CPP  Clinical Pharmacist Practitioner, GI/IBD  [phone]

## 2023-12-29 DIAGNOSIS — Z79899 Other long term (current) drug therapy: Secondary | ICD-10-CM | POA: Diagnosis not present

## 2023-12-30 LAB — COMPREHENSIVE METABOLIC PANEL
ALBUMIN: 4.7 g/dL (ref 3.9–4.9)
ALKALINE PHOSPHATASE: 89 IU/L (ref 47–123)
ALT (SGPT): 30 IU/L (ref 0–44)
AST (SGOT): 33 IU/L (ref 0–40)
BILIRUBIN TOTAL (MG/DL) IN SER/PLAS: 0.3 mg/dL (ref 0.0–1.2)
BLOOD UREA NITROGEN: 20 mg/dL (ref 8–27)
BUN / CREAT RATIO: 13 (ref 10–24)
CALCIUM: 9.1 mg/dL (ref 8.6–10.2)
CHLORIDE: 104 mmol/L (ref 96–106)
CO2: 21 mmol/L (ref 20–29)
CREATININE: 1.56 mg/dL — ABNORMAL HIGH (ref 0.76–1.27)
GLOBULIN, TOTAL: 2.8 g/dL (ref 1.5–4.5)
GLUCOSE: 119 mg/dL — ABNORMAL HIGH (ref 70–99)
POTASSIUM: 5.1 mmol/L (ref 3.5–5.2)
SODIUM: 139 mmol/L (ref 134–144)
TOTAL PROTEIN: 7.5 g/dL (ref 6.0–8.5)

## 2023-12-30 LAB — GAMMA GT: GAMMA GLUTAMYL TRANSFERASE: 34 IU/L (ref 0–65)

## 2023-12-30 LAB — CBC W/ DIFFERENTIAL
BANDED NEUTROPHILS ABSOLUTE COUNT: 0.1 x10E3/uL (ref 0.0–0.1)
BASOPHILS ABSOLUTE COUNT: 0.1 x10E3/uL (ref 0.0–0.2)
BASOPHILS RELATIVE PERCENT: 1 %
EOSINOPHILS ABSOLUTE COUNT: 0.6 x10E3/uL — ABNORMAL HIGH (ref 0.0–0.4)
EOSINOPHILS RELATIVE PERCENT: 6 %
HEMATOCRIT: 47.4 % (ref 37.5–51.0)
HEMOGLOBIN: 15.4 g/dL (ref 13.0–17.7)
IMMATURE GRANULOCYTES: 1 %
LYMPHOCYTES ABSOLUTE COUNT: 2.2 x10E3/uL (ref 0.7–3.1)
LYMPHOCYTES RELATIVE PERCENT: 23 %
MEAN CORPUSCULAR HEMOGLOBIN CONC: 32.5 g/dL (ref 31.5–35.7)
MEAN CORPUSCULAR HEMOGLOBIN: 28.3 pg (ref 26.6–33.0)
MEAN CORPUSCULAR VOLUME: 87 fL (ref 79–97)
MONOCYTES ABSOLUTE COUNT: 0.8 x10E3/uL (ref 0.1–0.9)
MONOCYTES RELATIVE PERCENT: 9 %
NEUTROPHILS ABSOLUTE COUNT: 5.9 x10E3/uL (ref 1.4–7.0)
NEUTROPHILS RELATIVE PERCENT: 60 %
PLATELET COUNT: 245 x10E3/uL (ref 150–450)
RED BLOOD CELL COUNT: 5.45 x10E6/uL (ref 4.14–5.80)
RED CELL DISTRIBUTION WIDTH: 16.4 % — ABNORMAL HIGH (ref 11.6–15.4)
WHITE BLOOD CELL COUNT: 9.6 x10E3/uL (ref 3.4–10.8)

## 2023-12-30 LAB — MAGNESIUM: MAGNESIUM: 1.8 mg/dL (ref 1.6–2.3)

## 2023-12-30 LAB — PHOSPHORUS: PHOSPHORUS, SERUM: 3.1 mg/dL (ref 2.8–4.1)

## 2023-12-30 LAB — BILIRUBIN, DIRECT: BILIRUBIN DIRECT: 0.11 mg/dL (ref 0.00–0.40)

## 2023-12-31 DIAGNOSIS — Z944 Liver transplant status: Principal | ICD-10-CM

## 2023-12-31 LAB — TACROLIMUS LEVEL: TACROLIMUS BLOOD: 5.4 ng/mL (ref 5.0–20.0)

## 2023-12-31 NOTE — Progress Notes (Signed)
 Reached out via mychart to let him know to drink more water  and keep his tac dose. See message     Hi Anthony Alvarado,  I'm Katheryn Badger, RN. I'm your new Post Liver Transplant Coordinator. The team here has reviewed your labs. Your tacrolimus  level is 5.6 with your goal being between 4 and 6. So no changes there. Looks like your creatinine jumped up a little bit. Please make sure to drink as close to 100oz a day as possible to help protect your kidneys.  Otherwise keep on doing what you are doing. Looks like your labs and tacrolimus  orders are all set for a while.     If there is anything I can do or questions I can help address please let me know. Have a beautiful weekend. If you would prefer phone calls please let me know, I'm happy to make that happen instead.      Katheryn Badger, RN, BSN, BSB  Liver Transplant Coordinator  [facility]  [address], [place], [place] [address]  [phone]) [phone] - fax [phone]   She/Her

## 2024-01-03 DIAGNOSIS — Z79899 Other long term (current) drug therapy: Principal | ICD-10-CM

## 2024-01-03 DIAGNOSIS — Z944 Liver transplant status: Principal | ICD-10-CM

## 2024-01-05 ENCOUNTER — Ambulatory Visit (INDEPENDENT_AMBULATORY_CARE_PROVIDER_SITE_OTHER): Payer: Medicare Other

## 2024-01-05 VITALS — Ht 69.29 in | Wt 201.0 lb

## 2024-01-05 DIAGNOSIS — Z0000 Encounter for general adult medical examination without abnormal findings: Secondary | ICD-10-CM

## 2024-01-05 NOTE — Patient Instructions (Signed)
 Mr. [name],  Thank you for taking the time for your Medicare Wellness Visit. I appreciate your continued commitment to your health goals. Please review the care plan we discussed, and feel free to reach out if I can assist you further.  Please note that Annual Wellness Visits do not include a physical exam. Some assessments may be limited, especially if the visit was conducted virtually. If needed, we may recommend an in-person follow-up with your provider.  Ongoing Care Seeing your primary care provider every 3 to 6 months helps us  monitor your health and provide consistent, personalized care.   Referrals If a referral was made during today's visit and you haven't received any updates within two weeks, please contact the referred provider directly to check on the status.  Recommended Screenings:  Health Maintenance  Topic Date Due   Pneumococcal Vaccine for age over 66 (3 of 3 - PCV20 or PCV21) 01/12/2023   Eye exam for diabetics  03/25/2023   COVID-19 Vaccine (5 - 2025-26 season) 10/25/2023   Medicare Annual Wellness Visit  01/04/2024   Hemoglobin A1C  03/23/2024   Yearly kidney health urinalysis for diabetes  03/28/2024   Yearly kidney function blood test for diabetes  09/20/2024   Complete foot exam   10/04/2024   DTaP/Tdap/Td vaccine (4 - Td or Tdap) 11/09/2029   Colon Cancer Screening  05/09/2033   Flu Shot  Completed   Hepatitis B Vaccine  Completed   Hepatitis C Screening  Completed   HIV Screening  Completed   Zoster (Shingles) Vaccine  Completed   HPV Vaccine  Aged Out   Meningitis B Vaccine  Aged Out       01/05/2024    8:59 AM  Advanced Directives  Does Patient Have a Medical Advance Directive? Yes  Type of Advance Directive Healthcare Power of Attorney  Copy of Healthcare Power of Attorney in Chart? No - copy requested    Vision: Annual vision screenings are recommended for early detection of glaucoma, cataracts, and diabetic retinopathy. These exams can also  reveal signs of chronic conditions such as diabetes and high blood pressure.  Dental: Annual dental screenings help detect early signs of oral cancer, gum disease, and other conditions linked to overall health, including heart disease and diabetes.

## 2024-01-05 NOTE — Progress Notes (Signed)
 Please attest and cosign this visit due to patients primary care provider not being in the office at the time the visit was completed.     Chief Complaint  Patient presents with   Medicare Wellness     Subjective:   [name] is a 63 y.o. male who presents for a Medicare Annual Wellness Visit.  Allergies (verified) Mycophenolate  mofetil and Watermelon flavoring agent (non-screening)   History: Past Medical History:  Diagnosis Date   Allergy    Asthma    Bicuspid aortic valve    sees dr okey   Cirrhosis [facility])    Coronary artery disease    DM2 (diabetes mellitus, type 2) (HCC)    Fatty liver    with h/o elevated LFT's   GERD (gastroesophageal reflux disease)    Heart murmur    Hypertension    Itching    all over last few months   Jaundice    Liver transplant recipient (HCC)    09/16/2018 at [facility]   Migraine with aura    OSA (obstructive sleep apnea) 09/14/2011   PSG 11/08/11>>AHI 31.6, SpO2 low 85%. wears CPAP, pt does not know settings   Pulmonary embolism (HCC)    2022   Sleep apnea    Thyroid  disease    Past Surgical History:  Procedure Laterality Date   AORTIC VALVE REPLACEMENT N/A 04/03/2021   Procedure: AORTIC VALVE REPLACEMENT (AVR) USING 27 MM INSPIRIS RESILIA  AORTIC VALVE;  Surgeon: Lucas Dorise POUR, MD;  [facility];  Service: Open Heart Surgery;  Laterality: N/A;   APPENDECTOMY  11/2007   Emergency   BIOPSY  09/22/2022   Procedure: BIOPSY;  Surgeon: Federico Rosario BROCKS, MD;  Location: [facility] ENDOSCOPY;  Service: Gastroenterology;;   BIOPSY THYROID   08/19/2007   Attempted, no tissue obtained   CARDIAC CATHETERIZATION  03/21/2018   CARDIAC VALVE REPLACEMENT     Feb 2023   CARDIOVASCULAR STRESS TEST  04/2005   Negative 06/05   CHOLECYSTECTOMY     CORONARY ARTERY BYPASS GRAFT N/A 04/03/2021   Procedure: CORONARY ARTERY BYPASS GRAFTING (CABG) x ONE ON CARDIOPULMONARY BYPASS. LIMA TO LAD;  Surgeon: Lucas Dorise POUR, MD;  Location: [facility] OR;  Service: Open  Heart Surgery;  Laterality: N/A;   DOPPLER ECHOCARDIOGRAPHY  07/2002   ESOPHAGEAL BANDING N/A 05/04/2016   Procedure: ESOPHAGEAL BANDING;  Surgeon: Lupita FORBES Commander, MD;  [facility] ENDOSCOPY;  Service: Endoscopy;  Laterality: N/A;   ESOPHAGOGASTRODUODENOSCOPY (EGD) WITH PROPOFOL  N/A 05/04/2016   Procedure: ESOPHAGOGASTRODUODENOSCOPY (EGD) WITH PROPOFOL ;  Surgeon: Lupita FORBES Commander, MD;  [facility] ENDOSCOPY;  Service: Endoscopy;  Laterality: N/A;   ESOPHAGOGASTRODUODENOSCOPY (EGD) WITH PROPOFOL  N/A 09/22/2022   Procedure: ESOPHAGOGASTRODUODENOSCOPY (EGD) WITH PROPOFOL ;  Surgeon: Federico Rosario BROCKS, MD;  Location: [facility] ENDOSCOPY;  Service: Gastroenterology;  Laterality: N/A;   FLEXIBLE SIGMOIDOSCOPY N/A 09/22/2022   Procedure: FLEXIBLE SIGMOIDOSCOPY;  Surgeon: Federico Rosario BROCKS, MD;  Location: [facility] ENDOSCOPY;  Service: Gastroenterology;  Laterality: N/A;   LIVER TRANSPLANT     09/16/2018 at [facility]   RIGHT/LEFT HEART CATH AND CORONARY ANGIOGRAPHY N/A 12/20/2020   Procedure: RIGHT/LEFT HEART CATH AND CORONARY ANGIOGRAPHY;  Surgeon: Verlin Lonni BIRCH, MD;  [facility] INVASIVE CV LAB;  Service: Cardiovascular;  Laterality: N/A;   TEE WITHOUT CARDIOVERSION N/A 04/03/2021   Procedure: TRANSESOPHAGEAL ECHOCARDIOGRAM (TEE);  Surgeon: Lucas Dorise POUR, MD;  Location: [facility] OR;  Service: Open Heart Surgery;  Laterality: N/A;   Family History  Problem Relation Age of Onset  Asthma Mother    Cancer Father        Died when pt was 58 of CA with mets, site unknown, COPD   COPD Father    Heart disease Sister        Heart stopped   Liver disease Sister        gene for liver disease   Colon cancer Maternal Grandmother    Heart disease Maternal Grandfather        MI, 51 YOA   Prostate cancer Maternal Uncle    Esophageal cancer Neg Hx    Rectal cancer Neg Hx    Stomach cancer Neg Hx    Social History   Occupational History   Occupation: Youth Worker: [employer]    Comment: Manages  front and writes orders --in [place]   Occupation: Disabled  Tobacco Use   Smoking status: Never    Passive exposure: Past   Smokeless tobacco: Never  Vaping Use   Vaping status: Never Used  Substance and Sexual Activity   Alcohol use: No   Drug use: No   Sexual activity: Not on file   Tobacco Counseling Counseling given: Not Answered  SDOH Screenings   Food Insecurity: No Food Insecurity (01/05/2024)  Housing: Unknown (01/05/2024)  Transportation Needs: No Transportation Needs (01/05/2024)  Utilities: Not At Risk (01/05/2024)  Alcohol Screen: Low Risk  (01/05/2024)  Depression (PHQ2-9): Low Risk  (01/05/2024)  Financial Resource Strain: Medium Risk (01/05/2024)  Physical Activity: Inactive (01/05/2024)  Social Connections: Moderately Integrated (01/05/2024)  Stress: No Stress Concern Present (01/05/2024)  Tobacco Use: Low Risk  (01/05/2024)  Health Literacy: Adequate Health Literacy (01/05/2024)   Depression Screen    01/05/2024    8:56 AM 10/05/2023   10:36 AM 01/05/2023    9:35 AM 01/04/2023    9:29 AM 11/12/2022    2:07 PM 10/09/2022   11:56 AM 08/18/2022    2:09 PM  PHQ 2/9 Scores  PHQ - 2 Score 0 0 0 0 0 0 1  PHQ- 9 Score  4  2   3  3  4       Data saved with a previous flowsheet row definition     Goals Addressed             This Visit's Progress    DIET - EAT MORE FRUITS AND VEGETABLES-will continue work on this   Not on track    To walk at least 3 days a week         Visit info / Clinical Intake: Medicare Wellness Visit Type:: Subsequent Annual Wellness Visit Persons participating in visit:: patient Medicare Wellness Visit Mode:: Telephone If telephone:: video declined Because this visit was a virtual/telehealth visit:: unable to obtan vitals due to lack of equipment If Telephone or Video please confirm:: I discussed the limitations of evaluation and management by telemedicine Patient Location:: home Provider Location:: clinic Information  given by:: patient Interpreter Needed?: No Pre-visit prep was completed: no AWV questionnaire completed by patient prior to visit?: no Living arrangements:: with family/others Patient's Overall Health Status Rating: good Typical amount of pain: some Does pain affect daily life?: (!) yes (slows me down) Are you currently prescribed opioids?: no  Dietary Habits and Nutritional Risks How many meals a day?: 3 Eats fruit and vegetables daily?: (!) no Most meals are obtained by: preparing own meals; eating out (half/half out and home) In the last 2 weeks, have you had any of the following?: ROLLEN)  nausea, vomiting, diarrhea; unintentional weight gain (nausea/diarrhea) Diabetic:: (!) yes Any non-healing wounds?: no How often do you check your BS?: continuous glucose monitor  Functional Status Activities of Daily Living (to include ambulation/medication): Independent Ambulation: Independent with device- listed below Home Assistive Devices/Equipment: Other (Comment) (walking stick) Medication Administration: Independent Home Management: Independent Manage your own finances?: yes Primary transportation is: driving Concerns about vision?: no *vision screening is required for WTM* Concerns about hearing?: no  Fall Screening Falls in the past year?: 0 Number of falls in past year: 0 Was there an injury with Fall?: 0 Fall Risk Category Calculator: 0 Patient Fall Risk Level: Low Fall Risk  Fall Risk Patient at Risk for Falls Due to: No Fall Risks Fall risk Follow up: Education provided; Falls prevention discussed  Home and Transportation Safety: All rugs have non-skid backing?: N/A, no rugs All stairs or steps have railings?: yes Grab bars in the bathtub or shower?: yes Have non-skid surface in bathtub or shower?: yes Good home lighting?: yes Regular seat belt use?: yes Hospital stays in the last year:: no  Cognitive Assessment Difficulty concentrating, remembering, or making  decisions? : yes (remembering things and concentrating)  Advance Directives (For Healthcare) Does Patient Have a Medical Advance Directive?: Yes Type of Advance Directive: Healthcare Power of Attorney Copy of Healthcare Power of Attorney in Chart?: No - copy requested      Objective:    Today's Vitals   01/05/24 0852  Weight: 201 lb (91.2 kg)  Height: 5' 9.29 (1.76 m)   Body mass index is 29.43 kg/m.  Current Medications (verified) Outpatient Encounter Medications as of 01/05/2024  Medication Sig   acetaminophen  (TYLENOL ) 500 MG tablet Take 500 mg by mouth every 6 (six) hours as needed for moderate pain or headache.   albuterol  (VENTOLIN  HFA) 108 (90 Base) MCG/ACT inhaler Inhale 2 puffs into the lungs every 6 (six) hours as needed for wheezing or shortness of breath.   aspirin  EC 325 MG EC tablet Take 1 tablet (325 mg total) by mouth daily.   azelastine  (ASTELIN ) 0.1 % nasal spray PLACE 2 SPRAYS INTO BOTH NOSTRILS TWICE DAILY AS NEEDED   calcium carbonate (TUMS - DOSED IN MG ELEMENTAL CALCIUM) 500 MG chewable tablet Chew 1 tablet by mouth daily.   Cholecalciferol  (VITAMIN D ) 50 MCG (2000 UT) tablet Take 2,000 Units by mouth daily.   empagliflozin  (JARDIANCE ) 25 MG TABS tablet Take 1 tablet (25 mg total) by mouth daily before breakfast.   fluticasone  (FLONASE ) 50 MCG/ACT nasal spray PLACE ONE OR TWO SPRAYS INTO BOTH NOSTRILS DAILY AS NEEDED.   insulin  lispro (HUMALOG  KWIKPEN) 100 UNIT/ML KwikPen Humalog  8 for breakfast, 10 units for lunch, 10 units with dinner or by sliding scale.   insulin  NPH Human (HUMULIN N) 100 UNIT/ML injection 14 units per AM.   Iron , Ferrous Sulfate , 325 (65 Fe) MG TABS Take 325 mg by mouth daily.   levothyroxine  (SYNTHROID ) 175 MCG tablet Take 1 tablet (175 mcg total) by mouth daily before breakfast.   linagliptin  (TRADJENTA ) 5 MG TABS tablet Take 1 tablet by mouth daily.   metoprolol  tartrate (LOPRESSOR ) 25 MG tablet Take 1 tablet (25 mg total) by mouth  two (2) times a day.   NON FORMULARY Pt uses a cpap nightly   ondansetron  (ZOFRAN ) 4 MG tablet Take 1 tablet (4 mg total) by mouth every 6 (six) hours.   OneTouch Delica Lancets 33G MISC USE TO CHECK SUGAR DAILY   ONETOUCH ULTRA test strip USE  TO CHECK SUGAR DAILY   pravastatin  (PRAVACHOL ) 40 MG tablet Take 1 tablet (40 mg total) by mouth every evening.   Risankizumab -rzaa (SKYRIZI  [place]) Inject 360 mg into the skin every 8 (eight) weeks.   sertraline  (ZOLOFT ) 50 MG tablet Take 1 tablet (50 mg total) by mouth daily.   tacrolimus  (PROGRAF ) 1 MG capsule Take 2 capsules (2 mg total) by mouth 2 (two) times daily.   No facility-administered encounter medications on file as of 01/05/2024.   Hearing/Vision screen Vision Screening - Comments:: UTD w/visits to Dr Darlys Vision Immunizations and Health Maintenance Health Maintenance  Topic Date Due   Pneumococcal Vaccine: 50+ Years (3 of 3 - PCV20 or PCV21) 01/12/2023   OPHTHALMOLOGY EXAM  03/25/2023   COVID-19 Vaccine (5 - 2025-26 season) 10/25/2023   Medicare Annual Wellness (AWV)  01/04/2024   HEMOGLOBIN A1C  03/23/2024   Diabetic kidney evaluation - Urine ACR  03/28/2024   Diabetic kidney evaluation - eGFR measurement  09/20/2024   FOOT EXAM  10/04/2024   DTaP/Tdap/Td (4 - Td or Tdap) 11/09/2029   Colonoscopy  05/09/2033   Influenza Vaccine  Completed   Hepatitis B Vaccines 19-59 Average Risk  Completed   Hepatitis C Screening  Completed   HIV Screening  Completed   Zoster Vaccines- Shingrix  Completed   HPV VACCINES  Aged Out   Meningococcal B Vaccine  Aged Out       Assessment/Plan:  This is a routine wellness examination for [place].  Patient Care Team: Cleatus Arlyss RAMAN, MD as PCP - [facility], [facility] Od Romero Maillard as Diabetes Educator (Endocrinology) Okey Vina GAILS, MD as Consulting Physician (Cardiology)  I have personally reviewed and noted the following in the patient's chart:   Medical and social  history Use of alcohol, tobacco or illicit drugs  Current medications and supplements including opioid prescriptions. Functional ability and status Nutritional status Physical activity Advanced directives List of other physicians Hospitalizations, surgeries, and ER visits in previous 12 months Vitals Screenings to include cognitive, depression, and falls Referrals and appointments  No orders of the defined types were placed in this encounter.  In addition, I have reviewed and discussed with patient certain preventive protocols, quality metrics, and best practice recommendations. A written personalized care plan for preventive services as well as general preventive health recommendations were provided to patient.   Erminio LITTIE Saris, LPN   88/87/7974   AWV/CPE scheduled simuntaneously for 2026. Lab visit made for CPE Labs also  After Visit Summary: (MyChart) Due to this being a telephonic visit, the after visit summary with patients personalized plan was offered to patient via MyChart   Nurse Notes: Pt has no concerns or questions. AWV made for one year

## 2024-01-12 DIAGNOSIS — D500 Iron deficiency anemia secondary to blood loss (chronic): Secondary | ICD-10-CM | POA: Diagnosis not present

## 2024-01-12 DIAGNOSIS — K50111 Crohn's disease of large intestine with rectal bleeding: Secondary | ICD-10-CM | POA: Diagnosis not present

## 2024-01-17 DIAGNOSIS — Z79899 Other long term (current) drug therapy: Principal | ICD-10-CM

## 2024-01-17 DIAGNOSIS — Z944 Liver transplant status: Principal | ICD-10-CM

## 2024-01-18 DIAGNOSIS — Z944 Liver transplant status: Principal | ICD-10-CM

## 2024-01-18 DIAGNOSIS — Z9189 Other specified personal risk factors, not elsewhere classified: Principal | ICD-10-CM

## 2024-01-25 NOTE — Progress Notes (Deleted)
 Cardiology Office Note   Date:  01/25/2024   ID:  Justin, [name] [birth year], MRN [id number]  PCP:  Cleatus Arlyss RAMAN, MD  Cardiologist:   Vina Gull, MD   F/U of AV dz and CAD     History of Present Illness: [name] is a 63 y.o. male with a history of aortic stenosis, cirrhosis, HTN. OSA (On CPAP).    2019  As pretransplant evaluation LHC done   This showed 40% D1, 30 to 40% D2; 40 to 50% OM2   LVEF normal   Mild AS    October 2022  Echo showed LVEF normal   Mod LVH   Mean grad 31 mm I  DVI 0.22.  Felt severe AS   LHC showed severe LAD stenosis   CCTA showed multiple multilple nonobstructie PE   Started on Eliquis  L Feb 2023 PT  underwent CABG x 1(LIMA to LAD) and AVR with Inspiriris Resilia pericardial valve   June 2024  Pt  admitted for colitis Initially at [facility]  Then at [facility] .  Hgb at one point 6.6.   Rx Abx    Pt had syncopal spell   BP 70s/   Responded to fluids   Felt vagal, occurred on commode      I saw the pt in Sept 2024  Since seen he denies CP   Breathing is stable   He still gets tired with activity   Never felt like he got a great response from valve replacement  He notes occasional heart fluttering, often when not doing anyting    Deneis dizziness  Episodes last about 20 t o30 min    Notes some achiness      Admits to eating poorly    NOt may veggies   I saw the pt in Feb 2025   No outpatient medications have been marked as taking for the 01/28/24 encounter (Appointment) with [name], MD.     Allergies:   Mycophenolate  mofetil and Watermelon flavoring agent (non-screening)   Past Medical History:  Diagnosis Date   Allergy    Asthma    Bicuspid aortic valve    sees dr gull   Cirrhosis [facility])    Coronary artery disease    DM2 (diabetes mellitus, type 2) (HCC)    Fatty liver    with h/o elevated LFT's   GERD (gastroesophageal reflux disease)    Heart murmur    Hypertension    Itching    all over last few months   Jaundice     Liver transplant recipient (HCC)    09/16/2018 at [facility]   Migraine with aura    OSA (obstructive sleep apnea) 09/14/2011   PSG 11/08/11>>AHI 31.6, SpO2 low 85%. wears CPAP, pt does not know settings   Pulmonary embolism (HCC)    2022   Sleep apnea    Thyroid  disease     Past Surgical History:  Procedure Laterality Date   AORTIC VALVE REPLACEMENT N/A 04/03/2021   Procedure: AORTIC VALVE REPLACEMENT (AVR) USING 27 MM INSPIRIS RESILIA  AORTIC VALVE;  Surgeon: Lucas Dorise POUR, MD;  [facility] OR;  Service: Open Heart Surgery;  Laterality: N/A;   APPENDECTOMY  11/2007   Emergency   BIOPSY  09/22/2022   Procedure: BIOPSY;  Surgeon: Federico Rosario BROCKS, MD;  Location: [facility] ENDOSCOPY;  Service: Gastroenterology;;   BIOPSY THYROID   08/19/2007   Attempted, no tissue obtained   CARDIAC CATHETERIZATION  03/21/2018  CARDIAC VALVE REPLACEMENT     Feb 2023   CARDIOVASCULAR STRESS TEST  04/2005   Negative 06/05   CHOLECYSTECTOMY     CORONARY ARTERY BYPASS GRAFT N/A 04/03/2021   Procedure: CORONARY ARTERY BYPASS GRAFTING (CABG) x ONE ON CARDIOPULMONARY BYPASS. LIMA TO LAD;  Surgeon: Lucas Dorise POUR, MD;  Location: [facility] OR;  Service: Open Heart Surgery;  Laterality: N/A;   DOPPLER ECHOCARDIOGRAPHY  07/2002   ESOPHAGEAL BANDING N/A 05/04/2016   Procedure: ESOPHAGEAL BANDING;  Surgeon: Lupita FORBES Commander, MD;  [facility] ENDOSCOPY;  Service: Endoscopy;  Laterality: N/A;   ESOPHAGOGASTRODUODENOSCOPY (EGD) WITH PROPOFOL  N/A 05/04/2016   Procedure: ESOPHAGOGASTRODUODENOSCOPY (EGD) WITH PROPOFOL ;  Surgeon: Lupita FORBES Commander, MD;  [facility] ENDOSCOPY;  Service: Endoscopy;  Laterality: N/A;   ESOPHAGOGASTRODUODENOSCOPY (EGD) WITH PROPOFOL  N/A 09/22/2022   Procedure: ESOPHAGOGASTRODUODENOSCOPY (EGD) WITH PROPOFOL ;  Surgeon: Federico Rosario BROCKS, MD;  Location: [facility] ENDOSCOPY;  Service: Gastroenterology;  Laterality: N/A;   FLEXIBLE SIGMOIDOSCOPY N/A 09/22/2022   Procedure: FLEXIBLE SIGMOIDOSCOPY;  Surgeon: Federico Rosario BROCKS,  MD;  Location: [facility] ENDOSCOPY;  Service: Gastroenterology;  Laterality: N/A;   LIVER TRANSPLANT     09/16/2018 at [facility]   RIGHT/LEFT HEART CATH AND CORONARY ANGIOGRAPHY N/A 12/20/2020   Procedure: RIGHT/LEFT HEART CATH AND CORONARY ANGIOGRAPHY;  Surgeon: Verlin Lonni BIRCH, MD;  [facility] INVASIVE CV LAB;  Service: Cardiovascular;  Laterality: N/A;   TEE WITHOUT CARDIOVERSION N/A 04/03/2021   Procedure: TRANSESOPHAGEAL ECHOCARDIOGRAM (TEE);  Surgeon: Lucas Dorise POUR, MD;  Location: [facility] OR;  Service: Open Heart Surgery;  Laterality: N/A;     Social History:  The patient  reports that he has never smoked. He has been exposed to tobacco smoke. He has never used smokeless tobacco. He reports that he does not drink alcohol and does not use drugs.   Family History:  The patient's family history includes Asthma in his mother; COPD in his father; Cancer in his father; Colon cancer in his maternal grandmother; Heart disease in his maternal grandfather and sister; Liver disease in his sister; Prostate cancer in his maternal uncle.    ROS:  Please see the history of present illness. All other systems are reviewed and  Negative to the above problem except as noted.    PHYSICAL EXAM: VS:  There were no vitals taken for this visit.  GEN: Pt is a 63 yo in no acute distress  Neck: JVP normal    No carotid bruits Cardiac: RRR  no murmur  No LE  edema  Respiratory:  clear to auscultation   GI: soft, nontender,  No hepatomegaly    EKG:  EKG is not done today   Echo   05/15/21 Left ventricular ejection fraction, by estimation, is 55 to 60%. The left ventricle has normal function. The left ventricle has no regional wall motion abnormalities. There is moderate concentric left ventricular hypertrophy. Left ventricular diastolic parameters are consistent with Grade II diastolic dysfunction (pseudonormalization). Elevated left atrial pressure. The average left ventricular global longitudinal strain is  -16.3 %. The global longitudinal strain is abnormal. 1. Right ventricular systolic function is normal. The right ventricular size is normal. Tricuspid regurgitation signal is inadequate for assessing PA pressure. 2. 3. Left atrial size was mildly dilated. 4. The mitral valve is normal in structure. No evidence of mitral valve regurgitation. The aortic valve has been repaired/replaced. Aortic valve regurgitation is not visualized. There is a 27 mm Inspiris Resilia bioprosthetic valve present in the aortic position. Procedure Date: 04/03/21. Echo  findings are consistent with normal structure and function of the aortic valve prosthesis. Aortic valve mean gradient measures 6.0 mmHg. Aortic valve Vmax measures 1.66 m/s. 5. Aortic dilatation noted. There is borderline dilatation of the aortic root, measuring 38 mm. There is mild dilatation of the ascending aorta, measuring 41 mm.  LHeart cath   12/20/20   Prox RCA lesion is 20% stenosed.   Mid RCA to Dist RCA lesion is 20% stenosed.   RPDA lesion is 20% stenosed.   Mid LAD lesion is 80% stenosed.   Severe mid LAD stenosis Mild non-obstructive disease in the RCA and Circumflex Severe aortic stenosis (mean gradient 50.6 mmHg, peak to peak gradient 66 mmHg, AVA 0.78 cm2).    Recommendations: He has a severe mid LAD stenosis (not critical) and severe AS with bicuspid aortic valve. Ideally he would be treated with single vessel CABG (LIMA to LAD) and surgical AVR given his young age and given the pathology associated with bicuspid aortic valves. Given his prior liver transplantation, he will be higher risk for CABG/AVR. If he is not felt to be a good candidate for surgery, will plan PCI of the LAD followed by TAVR. Will review with the structural heart team and plan for him to see Dr. Lucas in the CT surgery office following his CT scans.   Lipid Panel    Component Value Date/Time   CHOL 121 09/21/2023 0805   CHOL 113 01/01/2022 1028   TRIG  131.0 09/21/2023 0805   HDL 46.40 09/21/2023 0805   HDL 28 (L) 01/01/2022 1028   CHOLHDL 3 09/21/2023 0805   VLDL 26.2 09/21/2023 0805   LDLCALC 49 09/21/2023 0805   LDLCALC 22 01/01/2022 1028   LDLDIRECT 35.0 11/18/2020 0958      Wt Readings from Last 3 Encounters:  01/05/24 201 lb (91.2 kg)  10/05/23 201 lb (91.2 kg)  03/31/23 195 lb 12.8 oz (88.8 kg)      ASSESSMENT AND PLAN:  1  CAD  Pt is s/p CABG x 1 with LIMA to LAD    No symptoms of angina  Follow   2  Aortic stenosis   Pt is s/p AVR in early 2023   Echo in March 2023 showed prosthesis functioning well    3  HTN  BP is OK  Follow   4  Tachycardia   Will set up for Zio patch to document rhythm   5  DM  A1 C 7.6  Reviewed diet extensively  Miinimize processed foods, sugars  Lots of veggies.  Esp with hx of colitis   6  HL  LDL  22    Would recomm holding pravastatin  Follow repsonse for achiness   7  GI   Follows at [facility] with GI for colitis and liver transplant service          Current medicines are reviewed at length with the patient today.  The patient does not have concerns regarding medicines.  Signed, Vina Gull, MD  01/25/2024 2:20 PM    [facility] [address] [place], [place], [place]  [address] Phone: [phone]; Fax: [phone]

## 2024-01-26 DIAGNOSIS — Z9189 Other specified personal risk factors, not elsewhere classified: Secondary | ICD-10-CM | POA: Diagnosis not present

## 2024-01-26 DIAGNOSIS — Z944 Liver transplant status: Secondary | ICD-10-CM | POA: Diagnosis not present

## 2024-01-26 NOTE — Progress Notes (Signed)
 [facility] Delivery Pharm[facility] Refill Coordination Note    Specialty Medication(s) to be Shipped:   Inflammatory Disorders: Skyrizi     Other medication(s) to be shipped: Freestyle libre    Specialty Medications not needed at this time: N/A     Anthony Alvarado, DOB: [birth year]  Phone: [phone] (home)       All above HIPAA information was verified with patient.     Was a nurse, learning disability used for this call? No    Completed refill call assessment today to schedule patient's medication shipment from the [facility] Delivery Pharmacy  [phone]).  All relevant notes have been reviewed.     Specialty medication(s) and dose(s) confirmed: Regimen is correct and unchanged.   Changes to medications: Jospeh reports no changes at this time.  Changes to insurance: No  New side effects reported not previously addressed with a pharmacist or physician: None reported  Questions for the pharmacist: No    Confirmed patient received a Conservation Officer, Historic Buildings and a Surveyor, Mining with first shipment. The patient will receive a drug information handout for each medication shipped and additional FDA Medication Guides as required.       DISEASE/MEDICATION-SPECIFIC INFORMATION        For patients on injectable medications: Next injection is scheduled for 02/02/2024.    SPECIALTY MEDICATION ADHERENCE     Medication Adherence    Patient reported X missed doses in the last month: 0  Specialty Medication: SKYRIZI  360 mg/2.4 mL (150 mg/mL) Injt (risankizumab -rzaa)  Patient is on additional specialty medications: No              Were doses missed due to medication being on hold? No    SKYRIZI  360 mg/2.4 mL (150 mg/mL) Injt (risankizumab -rzaa)  0 doses of medicine on hand       REFERRAL TO PHARMACIST     Referral to the pharmacist: Not needed      SHIPPING     Shipping address confirmed in Epic.     Cost and Payment: Patient has a $0 copay, payment information is not required.    Delivery Scheduled: Yes, Expected medication delivery date: 01/28/2024.     Medication will be delivered via UPS to the prescription address in Epic WAM.    [facility]  Specialty Technician

## 2024-01-27 MED FILL — FREESTYLE LIBRE 2 PLUS SENSOR DEVICE: ORAL | 42 days supply | Qty: 3 | Fill #0

## 2024-01-27 MED FILL — SKYRIZI 360 MG/2.4 ML (150 MG/ML) SUBCUTANEOUS WEARABLE INJECTOR: SUBCUTANEOUS | 56 days supply | Qty: 2.4 | Fill #2

## 2024-01-28 ENCOUNTER — Ambulatory Visit: Admitting: Internal Medicine

## 2024-01-28 DIAGNOSIS — K50111 Crohn's disease of large intestine with rectal bleeding: Principal | ICD-10-CM

## 2024-01-30 ENCOUNTER — Ambulatory Visit: Payer: Self-pay | Admitting: Family Medicine

## 2024-01-31 DIAGNOSIS — Z944 Liver transplant status: Principal | ICD-10-CM

## 2024-01-31 DIAGNOSIS — Z79899 Other long term (current) drug therapy: Principal | ICD-10-CM

## 2024-02-10 NOTE — Progress Notes (Unsigned)
 Cardiology Office Note   Date:  02/10/2024   ID:  Justin, [name] Dec 13, 1960, MRN [id number]  PCP:  Cleatus Arlyss RAMAN, MD  Cardiologist:   Vina Gull, MD   F/U of AV dz and CAD     History of Present Illness: [name] is a 63 y.o. male with a history of aortic stenosis, cirrhosis, HTN. OSA (On CPAP).    2019  As pretransplant evaluation LHC done   This showed 40% D1, 30 to 40% D2; 40 to 50% OM2   LVEF normal   Mild AS    October 2022  Echo showed LVEF normal   Mod LVH   Mean grad 31 mm I  DVI 0.22.  Felt severe AS   LHC showed severe LAD stenosis   CCTA showed multiple multilple nonobstructie PE   Started on Eliquis  L Feb 2023 PT  underwent CABG x 1(LIMA to LAD) and AVR with Inspiriris Resilia pericardial valve   June 2024  Pt  admitted for colitis Initially at [facility]  Then at [facility] .  Hgb at one point 6.6.   Rx Abx    Pt had syncopal spell   BP 70s/   Responded to fluids   Felt vagal, occurred on commode      I saw the pt in Sept 2024  Since seen he denies CP   Breathing is stable   He still gets tired with activity   Never felt like he got a great response from valve replacement  He notes occasional heart fluttering, often when not doing anyting    Deneis dizziness  Episodes last about 20 t o30 min    Notes some achiness      Admits to eating poorly    NOt may veggies   I saw the pt in Feb 2025   No outpatient medications have been marked as taking for the 02/11/24 encounter (Appointment) with [name], MD.     Allergies:   Mycophenolate  mofetil and Watermelon flavoring agent (non-screening)   Past Medical History:  Diagnosis Date   Allergy    Asthma    Bicuspid aortic valve    sees dr gull   Cirrhosis [facility])    Coronary artery disease    DM2 (diabetes mellitus, type 2) (HCC)    Fatty liver    with h/o elevated LFT's   GERD (gastroesophageal reflux disease)    Heart murmur    Hypertension    Itching    all over last few months   Jaundice     Liver transplant recipient (HCC)    09/16/2018 at [facility]   Migraine with aura    OSA (obstructive sleep apnea) 09/14/2011   PSG 11/08/11>>AHI 31.6, SpO2 low 85%. wears CPAP, pt does not know settings   Pulmonary embolism (HCC)    2022   Sleep apnea    Thyroid  disease     Past Surgical History:  Procedure Laterality Date   AORTIC VALVE REPLACEMENT N/A 04/03/2021   Procedure: AORTIC VALVE REPLACEMENT (AVR) USING 27 MM INSPIRIS RESILIA  AORTIC VALVE;  Surgeon: Lucas Dorise POUR, MD;  Location: [facility] OR;  Service: Open Heart Surgery;  Laterality: N/A;   APPENDECTOMY  11/2007   Emergency   BIOPSY  09/22/2022   Procedure: BIOPSY;  Surgeon: Federico Rosario BROCKS, MD;  Location: [facility] ENDOSCOPY;  Service: Gastroenterology;;   BIOPSY THYROID   08/19/2007   Attempted, no tissue obtained   CARDIAC CATHETERIZATION  03/21/2018  CARDIAC VALVE REPLACEMENT     Feb 2023   CARDIOVASCULAR STRESS TEST  04/2005   Negative 06/05   CHOLECYSTECTOMY     CORONARY ARTERY BYPASS GRAFT N/A 04/03/2021   Procedure: CORONARY ARTERY BYPASS GRAFTING (CABG) x ONE ON CARDIOPULMONARY BYPASS. LIMA TO LAD;  Surgeon: Lucas Dorise POUR, MD;  Location[facility] OR;  Service: Open Heart Surgery;  Laterality: N/A;   DOPPLER ECHOCARDIOGRAPHY  07/2002   ESOPHAGEAL BANDING N/A 05/04/2016   Procedure: ESOPHAGEAL BANDING;  Surgeon: Lupita FORBES Commander, MD;  Location: [facility] ENDOSCOPY;  Service: Endoscopy;  Laterality: N/A;   ESOPHAGOGASTRODUODENOSCOPY (EGD) WITH PROPOFOL  N/A 05/04/2016   Procedure: ESOPHAGOGASTRODUODENOSCOPY (EGD) WITH PROPOFOL ;  Surgeon: Lupita FORBES Commander, MD;  Location: [facility] ENDOSCOPY;  Service: Endoscopy;  Laterality: N/A;   ESOPHAGOGASTRODUODENOSCOPY (EGD) WITH PROPOFOL  N/A 09/22/2022   Procedure: ESOPHAGOGASTRODUODENOSCOPY (EGD) WITH PROPOFOL ;  Surgeon: Federico Rosario BROCKS, MD;  Location: [facility] ENDOSCOPY;  Service: Gastroenterology;  Laterality: N/A;   FLEXIBLE SIGMOIDOSCOPY N/A 09/22/2022   Procedure: FLEXIBLE SIGMOIDOSCOPY;  Surgeon: Federico Rosario BROCKS,  MD;  Location: [facility] ENDOSCOPY;  Service: Gastroenterology;  Laterality: N/A;   LIVER TRANSPLANT     09/16/2018 at [facility]   RIGHT/LEFT HEART CATH AND CORONARY ANGIOGRAPHY N/A 12/20/2020   Procedure: RIGHT/LEFT HEART CATH AND CORONARY ANGIOGRAPHY;  Surgeon: Verlin Lonni BIRCH, MD;  Location: [facility] INVASIVE CV LAB;  Service: Cardiovascular;  Laterality: N/A;   TEE WITHOUT CARDIOVERSION N/A 04/03/2021   Procedure: TRANSESOPHAGEAL ECHOCARDIOGRAM (TEE);  Surgeon: Lucas Dorise POUR, MD;  Location: [facility] OR;  Service: Open Heart Surgery;  Laterality: N/A;     Social History:  The patient  reports that he has never smoked. He has been exposed to tobacco smoke. He has never used smokeless tobacco. He reports that he does not drink alcohol and does not use drugs.   Family History:  The patient's family history includes Asthma in his mother; COPD in his father; Cancer in his father; Colon cancer in his maternal grandmother; Heart disease in his maternal grandfather and sister; Liver disease in his sister; Prostate cancer in his maternal uncle.    ROS:  Please see the history of present illness. All other systems are reviewed and  Negative to the above problem except as noted.    PHYSICAL EXAM: VS:  There were no vitals taken for this visit.  GEN: Pt is a 63 yo in no acute distress  Neck: JVP normal    No carotid bruits Cardiac: RRR  no murmur  No LE  edema  Respiratory:  clear to auscultation   GI: soft, nontender,  No hepatomegaly    EKG:  EKG is not done today   Echo   05/15/21 Left ventricular ejection fraction, by estimation, is 55 to 60%. The left ventricle has normal function. The left ventricle has no regional wall motion abnormalities. There is moderate concentric left ventricular hypertrophy. Left ventricular diastolic parameters are consistent with Grade II diastolic dysfunction (pseudonormalization). Elevated left atrial pressure. The average left ventricular global longitudinal strain is  -16.3 %. The global longitudinal strain is abnormal. 1. Right ventricular systolic function is normal. The right ventricular size is normal. Tricuspid regurgitation signal is inadequate for assessing PA pressure. 2. 3. Left atrial size was mildly dilated. 4. The mitral valve is normal in structure. No evidence of mitral valve regurgitation. The aortic valve has been repaired/replaced. Aortic valve regurgitation is not visualized. There is a 27 mm Inspiris Resilia bioprosthetic valve present in the aortic position. Procedure Date: 04/03/21. Echo  findings are consistent with normal structure and function of the aortic valve prosthesis. Aortic valve mean gradient measures 6.0 mmHg. Aortic valve Vmax measures 1.66 m/s. 5. Aortic dilatation noted. There is borderline dilatation of the aortic root, measuring 38 mm. There is mild dilatation of the ascending aorta, measuring 41 mm.  LHeart cath   12/20/20   Prox RCA lesion is 20% stenosed.   Mid RCA to Dist RCA lesion is 20% stenosed.   RPDA lesion is 20% stenosed.   Mid LAD lesion is 80% stenosed.   Severe mid LAD stenosis Mild non-obstructive disease in the RCA and Circumflex Severe aortic stenosis (mean gradient 50.6 mmHg, peak to peak gradient 66 mmHg, AVA 0.78 cm2).    Recommendations: He has a severe mid LAD stenosis (not critical) and severe AS with bicuspid aortic valve. Ideally he would be treated with single vessel CABG (LIMA to LAD) and surgical AVR given his young age and given the pathology associated with bicuspid aortic valves. Given his prior liver transplantation, he will be higher risk for CABG/AVR. If he is not felt to be a good candidate for surgery, will plan PCI of the LAD followed by TAVR. Will review with the structural heart team and plan for him to see Dr. Lucas in the CT surgery office following his CT scans.   Lipid Panel    Component Value Date/Time   CHOL 121 09/21/2023 0805   CHOL 113 01/01/2022 1028   TRIG  131.0 09/21/2023 0805   HDL 46.40 09/21/2023 0805   HDL 28 (L) 01/01/2022 1028   CHOLHDL 3 09/21/2023 0805   VLDL 26.2 09/21/2023 0805   LDLCALC 49 09/21/2023 0805   LDLCALC 22 01/01/2022 1028   LDLDIRECT 35.0 11/18/2020 0958      Wt Readings from Last 3 Encounters:  01/05/24 201 lb (91.2 kg)  10/05/23 201 lb (91.2 kg)  03/31/23 195 lb 12.8 oz (88.8 kg)      ASSESSMENT AND PLAN:  1  CAD  Pt is s/p CABG x 1 with LIMA to LAD    No symptoms of angina  Follow   2  Aortic stenosis   Pt is s/p AVR in early 2023   Echo in March 2023 showed prosthesis functioning well    3  HTN  BP is OK  Follow   4  Tachycardia   Will set up for Zio patch to document rhythm   5  DM  A1 C 7.6  Reviewed diet extensively  Miinimize processed foods, sugars  Lots of veggies.  Esp with hx of colitis   6  HL  LDL  22    Would recomm holding pravastatin  Follow repsonse for achiness   7  GI   Follows at [facility] with GI for colitis and liver transplant service          Current medicines are reviewed at length with the patient today.  The patient does not have concerns regarding medicines.  Signed, Vina Gull, MD  02/10/2024 9:59 AM    [facility] [address] [place], [place], [place]  [address] Phone: [phone]; Fax: [phone]

## 2024-02-11 ENCOUNTER — Ambulatory Visit: Attending: Internal Medicine | Admitting: Internal Medicine

## 2024-02-11 ENCOUNTER — Encounter: Payer: Self-pay | Admitting: Internal Medicine

## 2024-02-11 VITALS — BP 140/86 | HR 66 | Ht 69.29 in | Wt 210.0 lb

## 2024-02-11 DIAGNOSIS — R002 Palpitations: Secondary | ICD-10-CM | POA: Diagnosis not present

## 2024-02-11 NOTE — Patient Instructions (Signed)
" ° °  Follow-Up: At [facility], you and your health needs are our priority.  As part of our continuing mission to provide you with exceptional heart care, our providers are all part of one team.  This team includes your primary Cardiologist (physician) and Advanced Practice Providers or APPs (Physician Assistants and Nurse Practitioners) who all work together to provide you with the care you need, when you need it.  Your next appointment:   2 month(s)  Provider:   VINA GULL MD   Other Instructions BRIAN BLOOD PRESSURE MACHINE AND LOG TO THE NEXT APPOINTMENT         "

## 2024-02-14 DIAGNOSIS — Z944 Liver transplant status: Principal | ICD-10-CM

## 2024-02-14 DIAGNOSIS — Z79899 Other long term (current) drug therapy: Principal | ICD-10-CM

## 2024-02-22 ENCOUNTER — Telehealth: Payer: Self-pay

## 2024-02-22 NOTE — Telephone Encounter (Signed)
 Patient confirms he does receive his refills from [facility]. Informed patient I will share this with Dr. Okey.

## 2024-02-22 NOTE — Telephone Encounter (Signed)
-----   Message from Vina Gull, MD sent at 02/17/2024 11:02 PM EST ----- Please confirm that refills are from [facility]

## 2024-02-28 DIAGNOSIS — Z944 Liver transplant status: Principal | ICD-10-CM

## 2024-02-28 DIAGNOSIS — Z79899 Other long term (current) drug therapy: Principal | ICD-10-CM

## 2024-03-07 NOTE — Progress Notes (Signed)
 [facility]   MULTIDISCIPLINARY INFLAMMATORY BOWEL DISEASES CENTER  Date:  03/07/2024  Patient: Anthony Alvarado    INTERVAL    Anthony Alvarado returns today for ongoing care.    He is feeling fair.  He passes 1-3 semiformed nonbloody stools per day.  However, he is experiencing an intermittent tenesmus sensation with erratic stools.  He is not having abdominal pain.    He continues to experience diffuse arthralgias in many of his joints.  He does not know whether this is inflammatory with redness or just osteoarthritis.    He has remained adherent with risankizumab .    Short Crohn???s Disease Activity Index (CDAI) Scores         03/09/2024 Score Change       170   0     INFLAMMATORY BOWEL DISEASE HISTORY  Year of disease onset:  2024  Diagnosis:  Crohn's Disease  Age at onset:  > 64 years old (A3)  Location:  Colonic (L2)  Behavior:  Nonstricturing, nonpenetrating (B1)  Perianal Disease:  No    Disease Course:    09/2012- Normal colonoscopy  10/2017- CT with normal bowels  12/2020- Normal bowels on CT A/P  07/2022- Admitted to OSH with diarrhea and rectal bleeding, CTA with hyperenhancement of thick-walled sigmoid colon. Transferred to [facility]  08/2022- Colonoscopy with prolapsed hemorrhoids, severe mucosal changes consistent with congestion, erythema, friability, deep ulcerations. Normal TI. Given oral steroids.  No response. Admitted to hospital and given IV steroids and IFX with improvement  Path: Severely active colitis with ulceration  09/2022- Flexible sigmoidoscopy with external hemorrhoids. Solid stool in sigmoid colon.  Distal colon normal  03/2023- Fecal calprotectin 7300  04/2023- Colonoscopy with normal TI.  Moderately eroded, nodular, pseudopolypoid, scarred mucosa in the sigmoid colon, transverse colon, hepatic flexure, ascending colon and cecum.  Scattered pseudopolyps. SES-CD 14  Path: Mildly active chronic colitis, indefinite for low-grade dysplasia (descending colon)  04/2023- Establish at [facility]). Start RISA  06/2023- Norovirus infection  07/2023- fecal calprotectin 3650  01/2024- Fecal calprotectin 288    PAST HISTORY  PMHx  -Cryptogenic cirrhosis s/p OLT  -Diabetes mellitus, type II  -Hypertension  -OSA    PSHx  -Liver transplant  -TAVR for aortic stenosis  -Pulmonary decortication  -Appendectomy    FHx  -No IBD or colon cancer    SHx  -No alcohol  -No tobacco  -Former curator     MEDICATIONS  has a current medication list which includes the following prescription(s): aspirin , azelastine, onetouch ultra test, freestyle libre 3 sensor, calcium carbonate, cholecalciferol  (vitamin d3), ferrous sulfate , fluticasone propionate, hydrocortisone , insulin  lispro, humulin  n nph insulin  kwikpen, jardiance , levothyroxine , metoprolol  tartrate, pen needle, diabetic, pravastatin , skyrizi , sertraline , tacrolimus , and tradjenta .    ALLERGIES  Allergies   Allergen Reactions    Mycophenolate  Mofetil Diarrhea     Mycophenolate  colitis     Watermelon Flavor      Mouth itching     PHYSICAL EXAM  BP 133/84 (BP Site: L Arm, BP Position: Sitting)  - Pulse 69  - Temp 36.7 ??C (98 ??F) (Temporal)  - Ht 175.3 cm (5' 9)  - Wt 96.2 kg (212 lb)  - BMI 31.31 kg/m??     Gen: Well-appearing, no acute distress  Eyes: no scleral icterus.  HENT:  No temporal wasting  GI: Nontender, non-distended.    Labs and imaging reviewed.    ASSESSMENT AND PLAN    Anthony Alvarado is a 64 y.o. man with inflammatory  colonic Crohn's disease on risankizumab , and cryptogenic cirrhosis status post liver transplantation who returns today for ongoing care.     He is feeling mostly improved, but is not in full remission.  He however has shown consistent fecal calprotectin improvement, and most recent levels in December 2025 were 288.  I recommend that he continue risankizumab  for now with repeat visit in 3 months.  I also recommend colonoscopy in 3 months.  If he has persistently active disease despite 1 year of risankizumab , I recommend transition to an anti-TNF agent.    Second, he is at risk for vitamin and mineral deficiencies.  His testing identified mild iron  deficiency for which he is on oral iron .    Last, I reviewed his healthcare maintenance.  He is up-to-date.    PLAN:  Continue risankizumab   Colonoscopy in March 2026  Continue oral iron  supplementation  Immunizations against hepatitis B and influenza    Return to clinic in 3 months.    IBD Healthcare Maintenance     Cancer Colorectal cancer screening UTD    Skin cancer screening Defer    Cervical cancer screening Not Applicable       Vaccines Influenza UTD    Pneumococcus UTD    Herpes zoster UTD    Human papillomavirus Not Applicable    Respiratory syncytial virus Defer   Vitamins & Minerals Iron  deficiency screening UTD    Vitamin D  deficiency screening UTD    Vitamin B12 deficiency screening UTD   Osteoporosis screening UTD   Tobacco abstinence Does not use   NSAID abstinence Does not use     Norleen Aspen, MD MS  Multidisciplinary Inflammatory Bowel Diseases Center  [facility] 

## 2024-03-09 ENCOUNTER — Ambulatory Visit
Admit: 2024-03-09 | Discharge: 2024-03-10 | Payer: Medicare (Managed Care) | Attending: Gastroenterology | Primary: Gastroenterology

## 2024-03-09 DIAGNOSIS — K9089 Other intestinal malabsorption: Principal | ICD-10-CM

## 2024-03-09 DIAGNOSIS — K50111 Crohn's disease of large intestine with rectal bleeding: Principal | ICD-10-CM

## 2024-03-09 DIAGNOSIS — E611 Iron deficiency: Principal | ICD-10-CM

## 2024-03-09 NOTE — Patient Instructions (Signed)
 It was a pleasure seeing you today.  Here is a summary/wrap up from today's visit:     Continue Skyrizi   Colonoscopy in March. Call [phone], option 1, then option 2  If any trouble or symptoms, do not hesitate to call.     Norleen Aspen, MD MS  Multidisciplinary Inflammatory Bowel Diseases Center  [facility] 

## 2024-03-13 DIAGNOSIS — Z79899 Other long term (current) drug therapy: Principal | ICD-10-CM

## 2024-03-13 DIAGNOSIS — Z944 Liver transplant status: Principal | ICD-10-CM

## 2024-03-14 ENCOUNTER — Other Ambulatory Visit: Payer: Self-pay | Admitting: Family Medicine

## 2024-03-14 DIAGNOSIS — K50111 Crohn's disease of large intestine with rectal bleeding: Principal | ICD-10-CM

## 2024-03-14 MED ORDER — METOPROLOL TARTRATE 25 MG TABLET
ORAL_TABLET | Freq: Two times a day (BID) | ORAL | 1 refills | 90.00000 days
Start: 2024-03-14 — End: ?

## 2024-03-14 MED ORDER — SKYRIZI 360 MG/2.4 ML (150 MG/ML) SUBCUTANEOUS WEARABLE INJECTOR
SUBCUTANEOUS | 2 refills | 56.00000 days | Status: CP
Start: 2024-03-14 — End: ?

## 2024-03-14 NOTE — Telephone Encounter (Signed)
 Encounter for refill request:  Last clinic visit: 03/09/2024  Colonoscopy:   Appointments which have been scheduled for you      Mar 27, 2024 12:40 PM  (Arrive by 12:25 PM)  RETURN DIABETES with Romero Zelphia Maillard, MD  [facility] DIABETES AND ENDOCRINOLOGY [place] [facility] REGION) [address]  [facility] 1 through 4  [place] [address]  [phone]        Jun 29, 2024 10:30 AM  (Arrive by 10:15 AM)  RETURN IBD with Norleen Deward Aspen, MD  [facility] GI MEDICINE [place] [facility] REGION) [address]  [facility] 1 through 4  [place] [address]  [phone]        Sep 12, 2024 1:00 PM  (Arrive by 12:30 PM)  RETURN HEPATOLOGY with Donald KATHEE Matter, PA  [facility] LIVER TRANSPLANT [place] [facility] REGION) [address]  [place] [address]  [phone]             Lab Results   Component Value Date    WBC 9.6 12/29/2023    RBC 5.45 12/29/2023    HGB 15.4 12/29/2023    HCT 47.4 12/29/2023    PLT 245 12/29/2023    ALT 30 12/29/2023    AST 33 12/29/2023    ALKPHOS 89 12/29/2023    CRP <5.0 12/09/2023    CREATININE 1.56 (H) 12/29/2023       skyrizi  refills authorized x   per protocol

## 2024-03-14 NOTE — Progress Notes (Signed)
 [facility] Specialty and Home Delivery Pharmacy Refill Coordination Note    Specialty Medication(s) to be Shipped:   Transplant: SKYRIZI  360 mg/2.4 mL (150 mg/mL) Injt (risankizumab -rzaa) and tacrolimus  1mg     Other medication(s) to be shipped: pravastatin  and metoprolol     Specialty Medications not needed at this time: N/A     Anthony Alvarado, DOB: [birth year]  Phone: [phone] (home)       All above HIPAA information was verified with patient.     Was a nurse, learning disability used for this call? No    Completed refill call assessment today to schedule patient's medication shipment from the [facility] and Home Delivery Pharmacy  [phone]).  All relevant notes have been reviewed.     Specialty medication(s) and dose(s) confirmed: Regimen is correct and unchanged.   Changes to medications: [name] reports no changes at this time.  Changes to insurance: No  New side effects reported not previously addressed with a pharmacist or physician: None reported  Questions for the pharmacist: No    Confirmed patient received a Conservation Officer, Historic Buildings and a Surveyor, Mining with first shipment. The patient will receive a drug information handout for each medication shipped and additional FDA Medication Guides as required.       DISEASE/MEDICATION-SPECIFIC INFORMATION        N/A    SPECIALTY MEDICATION ADHERENCE     Medication Adherence    Patient reported X missed doses in the last month: 0  Specialty Medication: tacrolimus  1 MG capsule (PROGRAF )  Patient is on additional specialty medications: Yes  Additional Specialty Medications: SKYRIZI  360 mg/2.4 mL (150 mg/mL) Injt (risankizumab -rzaa)  Patient Reported Additional Medication X Missed Doses in the Last Month: 0  Patient is on more than two specialty medications: No              Were doses missed due to medication being on hold? No      tacrolimus  1 MG capsule (PROGRAF ): 14 days of medicine on hand     SKYRIZI  360 mg/2.4 mL (150 mg/mL) Injt (risankizumab -rzaa): 0 doses of medicine on hand     Specialty medication is an injection or given on a cycle: Yes, Next cycle is scheduled to start 03/23/24.    REFERRAL TO PHARMACIST     Referral to the pharmacist: Not needed      [facility]     Shipping address confirmed in Epic.     Cost and Payment: Patient has a $0 copay, payment information is not required.    Delivery Scheduled: Yes, Expected medication delivery date: 03/22/24.  However, Rx request for refills was sent to the provider as there are none remaining.     Medication will be delivered via UPS to the prescription address in Epic WAM.    [facility] Specialty and Home Delivery Pharmacy  Specialty Technician

## 2024-03-15 MED ORDER — METOPROLOL TARTRATE 25 MG TABLET
ORAL_TABLET | Freq: Two times a day (BID) | ORAL | 1 refills | 90.00000 days
Start: 2024-03-15 — End: ?

## 2024-03-15 NOTE — Telephone Encounter (Signed)
 Refill has been sent in please call patient and set up CPE with fasting labs after 10/05/23.

## 2024-03-21 MED FILL — METOPROLOL TARTRATE 25 MG TABLET: ORAL | 90 days supply | Qty: 180 | Fill #0

## 2024-03-21 MED FILL — TACROLIMUS 1 MG CAPSULE, IMMEDIATE-RELEASE: ORAL | 90 days supply | Qty: 360 | Fill #2

## 2024-03-21 MED FILL — PRAVASTATIN 40 MG TABLET: ORAL | 30 days supply | Qty: 30 | Fill #2

## 2024-03-27 ENCOUNTER — Encounter: Admit: 2024-03-27 | Discharge: 2024-03-28 | Payer: Medicare (Managed Care)

## 2024-03-27 DIAGNOSIS — E1165 Type 2 diabetes mellitus with hyperglycemia: Principal | ICD-10-CM

## 2024-03-27 DIAGNOSIS — Z794 Long term (current) use of insulin: Secondary | ICD-10-CM

## 2024-03-27 DIAGNOSIS — Z944 Liver transplant status: Principal | ICD-10-CM

## 2024-03-27 DIAGNOSIS — Z79899 Other long term (current) drug therapy: Secondary | ICD-10-CM

## 2024-03-27 NOTE — Patient Instructions (Signed)
 It is really important to take your insulin  and diabetes medications every day.  Here is a reminder if your regimen:    Continue NPH 14 units in the AM and NPH 8 units in PM  Continue Humalog  8 units for breakfast, 10 units for lunch, 10 units with dinner  Plus Humalog  scale: for every 50 points of blood sugar above 150, add another 2 units.     Continue Jardiance  25 mg daily  Continue Tradjenta  5 mg daily.    Also, please do get the A1C and urine protein test at [facility].

## 2024-04-14 ENCOUNTER — Ambulatory Visit: Admitting: Internal Medicine

## 2024-12-28 ENCOUNTER — Other Ambulatory Visit

## 2025-01-05 ENCOUNTER — Encounter: Admitting: Family Medicine

## 2025-01-05 ENCOUNTER — Ambulatory Visit
